# Patient Record
Sex: Male | Born: 1950
Health system: Southern US, Community
[De-identification: ages and names within clinical notes are randomized; demographics above are authoritative.]

## PROBLEM LIST (undated history)

## (undated) ENCOUNTER — Emergency Department (HOSPITAL_COMMUNITY): Discharge status: Absent without leave | Payer: Medicare Other

## (undated) DIAGNOSIS — E11319 Type 2 diabetes mellitus with unspecified diabetic retinopathy without macular edema: Secondary | ICD-10-CM

## (undated) DIAGNOSIS — E8770 Fluid overload, unspecified: Secondary | ICD-10-CM

## (undated) DIAGNOSIS — H547 Unspecified visual loss: Secondary | ICD-10-CM

## (undated) DIAGNOSIS — K219 Gastro-esophageal reflux disease without esophagitis: Secondary | ICD-10-CM

## (undated) DIAGNOSIS — C61 Malignant neoplasm of prostate: Secondary | ICD-10-CM

## (undated) DIAGNOSIS — L0201 Cutaneous abscess of face: Secondary | ICD-10-CM

## (undated) DIAGNOSIS — E08621 Diabetes mellitus due to underlying condition with foot ulcer: Secondary | ICD-10-CM

## (undated) DIAGNOSIS — M869 Osteomyelitis, unspecified: Secondary | ICD-10-CM

## (undated) DIAGNOSIS — L97509 Non-pressure chronic ulcer of other part of unspecified foot with unspecified severity: Secondary | ICD-10-CM

## (undated) DIAGNOSIS — E1169 Type 2 diabetes mellitus with other specified complication: Secondary | ICD-10-CM

## (undated) DIAGNOSIS — N184 Chronic kidney disease, stage 4 (severe): Secondary | ICD-10-CM

## (undated) DIAGNOSIS — E11621 Type 2 diabetes mellitus with foot ulcer: Secondary | ICD-10-CM

## (undated) DIAGNOSIS — L97502 Non-pressure chronic ulcer of other part of unspecified foot with fat layer exposed: Secondary | ICD-10-CM

## (undated) DIAGNOSIS — Z992 Dependence on renal dialysis: Secondary | ICD-10-CM

## (undated) DIAGNOSIS — N186 End stage renal disease: Secondary | ICD-10-CM

## (undated) DIAGNOSIS — I509 Heart failure, unspecified: Secondary | ICD-10-CM

## (undated) DIAGNOSIS — G8929 Other chronic pain: Secondary | ICD-10-CM

## (undated) DIAGNOSIS — E11628 Type 2 diabetes mellitus with other skin complications: Principal | ICD-10-CM

## (undated) DIAGNOSIS — I739 Peripheral vascular disease, unspecified: Principal | ICD-10-CM

## (undated) DIAGNOSIS — E0800 Diabetes mellitus due to underlying condition with hyperosmolarity without nonketotic hyperglycemic-hyperosmolar coma (NKHHC): Principal | ICD-10-CM

## (undated) DIAGNOSIS — L089 Local infection of the skin and subcutaneous tissue, unspecified: Secondary | ICD-10-CM

## (undated) DIAGNOSIS — S8991XA Unspecified injury of right lower leg, initial encounter: Secondary | ICD-10-CM

## (undated) DIAGNOSIS — Z742 Need for assistance at home and no other household member able to render care: Secondary | ICD-10-CM

## (undated) DIAGNOSIS — R5381 Other malaise: Secondary | ICD-10-CM

## (undated) DIAGNOSIS — M79671 Pain in right foot: Secondary | ICD-10-CM

## (undated) DIAGNOSIS — Z794 Long term (current) use of insulin: Secondary | ICD-10-CM

## (undated) HISTORY — PX: INGUINAL HERNIA REPAIR: SUR1180

## (undated) HISTORY — PX: CATARACT EXTRACTION W/ INTRAOCULAR LENS  IMPLANT, BILATERAL: SHX1307

## (undated) HISTORY — PX: EYE SURGERY: SHX253

---
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
## 1969-04-15 HISTORY — PX: SHOULDER SURGERY: SHX246

## 1999-04-16 HISTORY — PX: ROBOT ASSISTED LAPAROSCOPIC RADICAL PROSTATECTOMY: SHX5141

## 2002-01-09 DIAGNOSIS — E1139 Type 2 diabetes mellitus with other diabetic ophthalmic complication: Secondary | ICD-10-CM

## 2006-08-17 DIAGNOSIS — Z8546 Personal history of malignant neoplasm of prostate: Secondary | ICD-10-CM

## 2006-08-17 DIAGNOSIS — N393 Stress incontinence (female) (male): Secondary | ICD-10-CM | POA: Insufficient documentation

## 2006-11-03 ENCOUNTER — Encounter (INDEPENDENT_AMBULATORY_CARE_PROVIDER_SITE_OTHER): Payer: Self-pay | Admitting: Internal Medicine

## 2007-07-04 ENCOUNTER — Telehealth (INDEPENDENT_AMBULATORY_CARE_PROVIDER_SITE_OTHER): Payer: Self-pay | Admitting: Nurse Practitioner

## 2007-07-19 ENCOUNTER — Ambulatory Visit: Payer: Self-pay | Admitting: Internal Medicine

## 2007-07-19 DIAGNOSIS — E782 Mixed hyperlipidemia: Secondary | ICD-10-CM

## 2007-07-19 DIAGNOSIS — E119 Type 2 diabetes mellitus without complications: Secondary | ICD-10-CM

## 2007-07-19 DIAGNOSIS — IMO0002 Reserved for concepts with insufficient information to code with codable children: Secondary | ICD-10-CM | POA: Insufficient documentation

## 2007-07-19 DIAGNOSIS — D869 Sarcoidosis, unspecified: Secondary | ICD-10-CM

## 2007-07-19 DIAGNOSIS — H6120 Impacted cerumen, unspecified ear: Secondary | ICD-10-CM

## 2007-07-26 ENCOUNTER — Emergency Department (HOSPITAL_COMMUNITY): Admission: EM | Admit: 2007-07-26 | Discharge: 2007-07-26 | Payer: Self-pay | Admitting: Emergency Medicine

## 2007-07-26 ENCOUNTER — Encounter (INDEPENDENT_AMBULATORY_CARE_PROVIDER_SITE_OTHER): Payer: Self-pay | Admitting: Internal Medicine

## 2007-07-29 ENCOUNTER — Encounter (INDEPENDENT_AMBULATORY_CARE_PROVIDER_SITE_OTHER): Payer: Self-pay | Admitting: Internal Medicine

## 2007-07-29 LAB — CONVERTED CEMR LAB
Albumin: 4.4 g/dL (ref 3.5–5.2)
Alkaline Phosphatase: 101 units/L (ref 39–117)
BUN: 10 mg/dL (ref 6–23)
Creatinine, Ser: 1.09 mg/dL (ref 0.40–1.50)
Eosinophils Absolute: 0.2 10*3/uL (ref 0.2–0.7)
Eosinophils Relative: 5 % (ref 0–5)
Glucose, Bld: 153 mg/dL — ABNORMAL HIGH (ref 70–99)
HCT: 41.9 % (ref 39.0–52.0)
HDL: 40 mg/dL (ref >39)
Hemoglobin: 13.2 g/dL (ref 13.0–17.0)
LDL Cholesterol: 86 mg/dL (ref 0–99)
Lymphs Abs: 1.3 10*3/uL (ref 0.7–4.0)
MCV: 67.5 fL — ABNORMAL LOW (ref 78.0–100.0)
Monocytes Absolute: 0.4 10*3/uL (ref 0.1–1.0)
Monocytes Relative: 12 % (ref 3–12)
Neutrophils Relative %: 44 % (ref 43–77)
Potassium: 4.9 meq/L (ref 3.5–5.3)
RBC: 6.21 M/uL — ABNORMAL HIGH (ref 4.22–5.81)
Triglycerides: 311 mg/dL — ABNORMAL HIGH (ref <150)
WBC: 3.4 10*3/uL — ABNORMAL LOW (ref 4.0–10.5)

## 2007-07-31 ENCOUNTER — Encounter (INDEPENDENT_AMBULATORY_CARE_PROVIDER_SITE_OTHER): Payer: Self-pay | Admitting: Internal Medicine

## 2007-08-01 ENCOUNTER — Encounter (INDEPENDENT_AMBULATORY_CARE_PROVIDER_SITE_OTHER): Payer: Self-pay | Admitting: Internal Medicine

## 2007-08-02 ENCOUNTER — Encounter (INDEPENDENT_AMBULATORY_CARE_PROVIDER_SITE_OTHER): Payer: Self-pay | Admitting: Internal Medicine

## 2007-09-28 ENCOUNTER — Encounter: Admission: RE | Admit: 2007-09-28 | Discharge: 2007-12-27 | Payer: Self-pay | Admitting: Ophthalmology

## 2007-10-11 ENCOUNTER — Ambulatory Visit: Payer: Self-pay | Admitting: Internal Medicine

## 2007-10-11 DIAGNOSIS — L0201 Cutaneous abscess of face: Secondary | ICD-10-CM

## 2007-10-11 DIAGNOSIS — L03211 Cellulitis of face: Secondary | ICD-10-CM

## 2007-10-11 HISTORY — DX: Cutaneous abscess of face: L02.01

## 2007-10-24 ENCOUNTER — Emergency Department (HOSPITAL_COMMUNITY): Admission: EM | Admit: 2007-10-24 | Discharge: 2007-10-24 | Payer: Self-pay | Admitting: Emergency Medicine

## 2007-11-20 ENCOUNTER — Ambulatory Visit: Payer: Self-pay | Admitting: Internal Medicine

## 2007-12-02 ENCOUNTER — Encounter (INDEPENDENT_AMBULATORY_CARE_PROVIDER_SITE_OTHER): Payer: Self-pay | Admitting: Internal Medicine

## 2007-12-02 LAB — CONVERTED CEMR LAB
HDL: 40 mg/dL (ref >39)
LDL Cholesterol: 79 mg/dL (ref 0–99)
Triglycerides: 110 mg/dL (ref <150)
VLDL: 22 mg/dL (ref 0–40)

## 2007-12-19 ENCOUNTER — Encounter (INDEPENDENT_AMBULATORY_CARE_PROVIDER_SITE_OTHER): Payer: Self-pay | Admitting: Internal Medicine

## 2007-12-29 ENCOUNTER — Encounter (INDEPENDENT_AMBULATORY_CARE_PROVIDER_SITE_OTHER): Payer: Self-pay | Admitting: Internal Medicine

## 2007-12-29 ENCOUNTER — Emergency Department (HOSPITAL_COMMUNITY): Admission: EM | Admit: 2007-12-29 | Discharge: 2007-12-29 | Payer: Self-pay | Admitting: Emergency Medicine

## 2008-01-03 ENCOUNTER — Ambulatory Visit: Payer: Self-pay | Admitting: Internal Medicine

## 2008-01-03 DIAGNOSIS — N529 Male erectile dysfunction, unspecified: Secondary | ICD-10-CM | POA: Insufficient documentation

## 2008-01-03 DIAGNOSIS — K5000 Crohn's disease of small intestine without complications: Secondary | ICD-10-CM | POA: Insufficient documentation

## 2008-01-08 ENCOUNTER — Encounter (INDEPENDENT_AMBULATORY_CARE_PROVIDER_SITE_OTHER): Payer: Self-pay | Admitting: Internal Medicine

## 2008-01-13 LAB — CONVERTED CEMR LAB: Angiotensin 1 Converting Enzyme: 14 units/L (ref 9–67)

## 2008-01-14 ENCOUNTER — Telehealth (INDEPENDENT_AMBULATORY_CARE_PROVIDER_SITE_OTHER): Payer: Self-pay | Admitting: Internal Medicine

## 2008-06-13 ENCOUNTER — Telehealth (INDEPENDENT_AMBULATORY_CARE_PROVIDER_SITE_OTHER): Payer: Self-pay | Admitting: Internal Medicine

## 2008-06-23 ENCOUNTER — Telehealth (INDEPENDENT_AMBULATORY_CARE_PROVIDER_SITE_OTHER): Payer: Self-pay | Admitting: Internal Medicine

## 2008-08-19 ENCOUNTER — Ambulatory Visit: Payer: Self-pay | Admitting: Internal Medicine

## 2008-08-19 DIAGNOSIS — I10 Essential (primary) hypertension: Secondary | ICD-10-CM

## 2008-08-19 LAB — CONVERTED CEMR LAB
Ketones, urine, test strip: NEGATIVE
Protein, U semiquant: 30
Urobilinogen, UA: 0.2

## 2008-08-21 ENCOUNTER — Encounter (INDEPENDENT_AMBULATORY_CARE_PROVIDER_SITE_OTHER): Payer: Self-pay | Admitting: Internal Medicine

## 2008-08-21 LAB — CONVERTED CEMR LAB
Albumin ELP: 61.2 % (ref 55.8–66.1)
Albumin: 4.9 g/dL (ref 3.5–5.2)
Alkaline Phosphatase: 64 units/L (ref 39–117)
Alpha-1-Globulin: 3.1 % (ref 2.9–4.9)
BUN: 21 mg/dL (ref 6–23)
Beta Globulin: 5.5 % (ref 4.7–7.2)
CO2: 23 meq/L (ref 19–32)
Calcium: 10.2 mg/dL (ref 8.4–10.5)
Chloride: 98 meq/L (ref 96–112)
Eosinophils Absolute: 0.2 10*3/uL (ref 0.0–0.7)
Gamma Globulin: 16.4 % (ref 11.1–18.8)
Glucose, Bld: 177 mg/dL — ABNORMAL HIGH (ref 70–99)
HCT: 44.5 % (ref 39.0–52.0)
Hemoglobin: 14.1 g/dL (ref 13.0–17.0)
IgG (Immunoglobin G), Serum: 1610 mg/dL (ref 694–1618)
Lymphs Abs: 1.8 10*3/uL (ref 0.7–4.0)
MCHC: 31.7 g/dL (ref 30.0–36.0)
MCV: 66.4 fL — ABNORMAL LOW (ref 78.0–100.0)
Monocytes Relative: 12 % (ref 3–12)
Potassium: 4.5 meq/L (ref 3.5–5.3)
RDW: 16.1 % — ABNORMAL HIGH (ref 11.5–15.5)
Sodium: 138 meq/L (ref 135–145)
Total Protein: 8.4 g/dL — ABNORMAL HIGH (ref 6.0–8.3)

## 2008-08-22 ENCOUNTER — Encounter (INDEPENDENT_AMBULATORY_CARE_PROVIDER_SITE_OTHER): Payer: Self-pay | Admitting: Internal Medicine

## 2008-08-28 ENCOUNTER — Telehealth (INDEPENDENT_AMBULATORY_CARE_PROVIDER_SITE_OTHER): Payer: Self-pay | Admitting: Internal Medicine

## 2008-09-04 ENCOUNTER — Encounter (INDEPENDENT_AMBULATORY_CARE_PROVIDER_SITE_OTHER): Payer: Self-pay | Admitting: *Deleted

## 2008-09-09 ENCOUNTER — Encounter (INDEPENDENT_AMBULATORY_CARE_PROVIDER_SITE_OTHER): Payer: Self-pay | Admitting: Internal Medicine

## 2009-01-15 ENCOUNTER — Encounter (INDEPENDENT_AMBULATORY_CARE_PROVIDER_SITE_OTHER): Payer: Self-pay | Admitting: Internal Medicine

## 2009-08-27 ENCOUNTER — Encounter (INDEPENDENT_AMBULATORY_CARE_PROVIDER_SITE_OTHER): Payer: Self-pay | Admitting: Internal Medicine

## 2010-01-06 ENCOUNTER — Encounter (INDEPENDENT_AMBULATORY_CARE_PROVIDER_SITE_OTHER): Payer: Self-pay | Admitting: *Deleted

## 2010-07-11 ENCOUNTER — Inpatient Hospital Stay (HOSPITAL_COMMUNITY)
Admission: EM | Admit: 2010-07-11 | Discharge: 2010-07-16 | Payer: Self-pay | Source: Home / Self Care | Admitting: Emergency Medicine

## 2010-07-12 ENCOUNTER — Encounter (INDEPENDENT_AMBULATORY_CARE_PROVIDER_SITE_OTHER): Payer: Self-pay | Admitting: Internal Medicine

## 2010-07-12 ENCOUNTER — Ambulatory Visit: Payer: Self-pay | Admitting: Vascular Surgery

## 2010-09-14 NOTE — Letter (Signed)
Summary: *[facility] Results Follow up  [name], [place] [address]   Phone: [phone]  Fax: [phone]      01/06/2010   [name] [address] [place], [place]  [address]   Dear  Mr. [name],                            ____S.Drinkard,FNP   ____D. Gore,FNP       ____B. McPherson,MD   ____V. Rankins,MD    __xx__E. Mulberry,MD    ____N. Hassell Done, FNP  ____D. Jobe Igo, MD    ____K. Tomma Lightning, MD    ____Other     This letter is to inform you that your recent test(s):  _______Pap Smear    _______Lab Test     _______X-ray    _______ is within acceptable limits  _______ requires a medication change  _______ requires a follow-up lab visit  ___xx____ requires a follow-up visit with your provider   Comments:  Please call to schedule an appointment with Dr. Amil Amen.  Thank you.       _________________________________________________________ If you have any questions, please contact our office                     Sincerely,  Shellia Carwin CMA [facility]

## 2010-09-14 NOTE — Letter (Signed)
Summary: [facility]  [facility]   Imported By: Roland Earl 08/27/2009 10:09:16  _____________________________________________________________________  External Attachment:    Type:   Image     Comment:   External Document

## 2010-10-26 LAB — BASIC METABOLIC PANEL
BUN: 7 mg/dL (ref 6–23)
Calcium: 8.1 mg/dL — ABNORMAL LOW (ref 8.4–10.5)
GFR calc non Af Amer: 44 mL/min — ABNORMAL LOW (ref >60)
Glucose, Bld: 127 mg/dL — ABNORMAL HIGH (ref 70–99)
Sodium: 138 mEq/L (ref 135–145)

## 2010-10-26 LAB — GLUCOSE, CAPILLARY
Glucose-Capillary: 104 mg/dL — ABNORMAL HIGH (ref 70–99)
Glucose-Capillary: 123 mg/dL — ABNORMAL HIGH (ref 70–99)
Glucose-Capillary: 190 mg/dL — ABNORMAL HIGH (ref 70–99)

## 2010-10-26 LAB — VANCOMYCIN, TROUGH: Vancomycin Tr: 18.7 ug/mL (ref 10.0–20.0)

## 2010-10-27 LAB — URINE CULTURE
Colony Count: NO GROWTH
Culture  Setup Time: 201111280011
Culture: NO GROWTH

## 2010-10-27 LAB — CBC
HCT: 35.9 % — ABNORMAL LOW (ref 39.0–52.0)
HCT: 39.2 % (ref 39.0–52.0)
Hemoglobin: 10.6 g/dL — ABNORMAL LOW (ref 13.0–17.0)
Hemoglobin: 12.4 g/dL — ABNORMAL LOW (ref 13.0–17.0)
MCH: 21 pg — ABNORMAL LOW (ref 26.0–34.0)
MCH: 21 pg — ABNORMAL LOW (ref 26.0–34.0)
MCH: 21.3 pg — ABNORMAL LOW (ref 26.0–34.0)
MCHC: 31.5 g/dL (ref 30.0–36.0)
MCHC: 31.5 g/dL (ref 30.0–36.0)
MCHC: 32.2 g/dL (ref 30.0–36.0)
MCV: 66.2 fL — ABNORMAL LOW (ref 78.0–100.0)
MCV: 66.7 fL — ABNORMAL LOW (ref 78.0–100.0)
MCV: 66.9 fL — ABNORMAL LOW (ref 78.0–100.0)
Platelets: 174 10*3/uL (ref 150–400)
Platelets: 176 10*3/uL (ref 150–400)
Platelets: 176 10*3/uL (ref 150–400)
Platelets: 196 10*3/uL (ref 150–400)
RBC: 5.17 MIL/uL (ref 4.22–5.81)
RBC: 5.86 MIL/uL — ABNORMAL HIGH (ref 4.22–5.81)
RDW: 14.5 % (ref 11.5–15.5)
WBC: 9.1 10*3/uL (ref 4.0–10.5)

## 2010-10-27 LAB — DIFFERENTIAL
Basophils Absolute: 0 10*3/uL (ref 0.0–0.1)
Basophils Absolute: 0 10*3/uL (ref 0.0–0.1)
Basophils Absolute: 0 10*3/uL (ref 0.0–0.1)
Basophils Relative: 0 % (ref 0–1)
Basophils Relative: 0 % (ref 0–1)
Eosinophils Absolute: 0.1 10*3/uL (ref 0.0–0.7)
Eosinophils Absolute: 0.2 10*3/uL (ref 0.0–0.7)
Eosinophils Relative: 1 % (ref 0–5)
Lymphocytes Relative: 13 % (ref 12–46)
Lymphocytes Relative: 16 % (ref 12–46)
Lymphs Abs: 0.9 10*3/uL (ref 0.7–4.0)
Lymphs Abs: 1.2 10*3/uL (ref 0.7–4.0)
Monocytes Absolute: 0.7 10*3/uL (ref 0.1–1.0)
Monocytes Absolute: 0.7 10*3/uL (ref 0.1–1.0)
Monocytes Absolute: 0.7 10*3/uL (ref 0.1–1.0)
Monocytes Relative: 11 % (ref 3–12)
Neutro Abs: 5 10*3/uL (ref 1.7–7.7)
Neutro Abs: 5.9 10*3/uL (ref 1.7–7.7)
Neutrophils Relative %: 68 % (ref 43–77)
Neutrophils Relative %: 78 % — ABNORMAL HIGH (ref 43–77)

## 2010-10-27 LAB — BASIC METABOLIC PANEL
BUN: 15 mg/dL (ref 6–23)
CO2: 26 mEq/L (ref 19–32)
CO2: 26 mEq/L (ref 19–32)
Calcium: 8.4 mg/dL (ref 8.4–10.5)
Chloride: 101 mEq/L (ref 96–112)
Chloride: 102 mEq/L (ref 96–112)
Creatinine, Ser: 1.02 mg/dL (ref 0.4–1.5)
Creatinine, Ser: 1.35 mg/dL (ref 0.4–1.5)
GFR calc Af Amer: >60 mL/min (ref >60)
GFR calc Af Amer: >60 mL/min (ref >60)
GFR calc non Af Amer: >60 mL/min (ref >60)
Glucose, Bld: 216 mg/dL — ABNORMAL HIGH (ref 70–99)
Glucose, Bld: 296 mg/dL — ABNORMAL HIGH (ref 70–99)
Potassium: 4 mEq/L (ref 3.5–5.1)
Sodium: 135 mEq/L (ref 135–145)
Sodium: 139 mEq/L (ref 135–145)

## 2010-10-27 LAB — URINALYSIS, ROUTINE W REFLEX MICROSCOPIC
Bilirubin Urine: NEGATIVE
Glucose, UA: >1000 mg/dL — AB
Hgb urine dipstick: NEGATIVE
Ketones, ur: 15 mg/dL — AB
Leukocytes, UA: NEGATIVE
Nitrite: NEGATIVE
Protein, ur: NEGATIVE mg/dL
Specific Gravity, Urine: 1.02 (ref 1.005–1.030)
Urobilinogen, UA: 0.2 mg/dL (ref 0.0–1.0)
pH: 5.5 (ref 5.0–8.0)

## 2010-10-27 LAB — COMPREHENSIVE METABOLIC PANEL
AST: 18 U/L (ref 0–37)
Albumin: 2.7 g/dL — ABNORMAL LOW (ref 3.5–5.2)
BUN: 12 mg/dL (ref 6–23)
Calcium: 8.2 mg/dL — ABNORMAL LOW (ref 8.4–10.5)
Chloride: 107 mEq/L (ref 96–112)
Creatinine, Ser: 1.03 mg/dL (ref 0.4–1.5)
GFR calc Af Amer: >60 mL/min (ref >60)
GFR calc non Af Amer: >60 mL/min (ref >60)
Total Bilirubin: 0.9 mg/dL (ref 0.3–1.2)

## 2010-10-27 LAB — CULTURE, BLOOD (ROUTINE X 2)
Culture  Setup Time: 201111280941
Culture: NO GROWTH
Culture: NO GROWTH

## 2010-10-27 LAB — IRON AND TIBC
Saturation Ratios: 8 % — ABNORMAL LOW (ref 20–55)
UIBC: 155 ug/dL

## 2010-10-27 LAB — GLUCOSE, CAPILLARY
Glucose-Capillary: 170 mg/dL — ABNORMAL HIGH (ref 70–99)
Glucose-Capillary: 173 mg/dL — ABNORMAL HIGH (ref 70–99)
Glucose-Capillary: 194 mg/dL — ABNORMAL HIGH (ref 70–99)
Glucose-Capillary: 232 mg/dL — ABNORMAL HIGH (ref 70–99)
Glucose-Capillary: 238 mg/dL — ABNORMAL HIGH (ref 70–99)
Glucose-Capillary: 293 mg/dL — ABNORMAL HIGH (ref 70–99)
Glucose-Capillary: 301 mg/dL — ABNORMAL HIGH (ref 70–99)
Glucose-Capillary: 99 mg/dL (ref 70–99)

## 2010-10-27 LAB — URINE MICROSCOPIC-ADD ON

## 2010-10-27 LAB — FERRITIN: Ferritin: 158 ng/mL (ref 22–322)

## 2010-10-27 LAB — RETICULOCYTES
RBC.: 4.91 MIL/uL (ref 4.22–5.81)
Retic Ct Pct: 0.6 % (ref 0.4–3.1)

## 2010-10-27 LAB — HEMOGLOBIN A1C: Hgb A1c MFr Bld: 16.4 % — ABNORMAL HIGH (ref <5.7)

## 2010-10-27 LAB — FOLATE: Folate: 15.3 ng/mL

## 2010-10-27 LAB — URIC ACID: Uric Acid, Serum: 5.3 mg/dL (ref 4.0–7.8)

## 2011-05-11 LAB — COMPREHENSIVE METABOLIC PANEL
ALT: 26
AST: 20
Albumin: 3.9
Alkaline Phosphatase: 90
BUN: 12
CO2: 28
Calcium: 10.1
Chloride: 103
Creatinine, Ser: 1.14
GFR calc Af Amer: >60
GFR calc non Af Amer: >60
Glucose, Bld: 244 — ABNORMAL HIGH
Potassium: 5
Sodium: 137
Total Bilirubin: 1.2
Total Protein: 6.9

## 2011-05-11 LAB — DIFFERENTIAL
Basophils Absolute: 0
Basophils Relative: 0
Eosinophils Absolute: 0.1
Eosinophils Relative: 1
Lymphocytes Relative: 16
Lymphs Abs: 1.1
Monocytes Absolute: 0.5
Monocytes Relative: 7
Neutro Abs: 5.1
Neutrophils Relative %: 76

## 2011-05-11 LAB — URINALYSIS, ROUTINE W REFLEX MICROSCOPIC
Bilirubin Urine: NEGATIVE
Leukocytes, UA: NEGATIVE
Nitrite: NEGATIVE
Specific Gravity, Urine: 1.033 — ABNORMAL HIGH
Urobilinogen, UA: 0.2
pH: 5.5

## 2011-05-11 LAB — CBC
HCT: 43
Hemoglobin: 13.3
MCHC: 30.9
MCV: 68.8 — ABNORMAL LOW
Platelets: 138 — ABNORMAL LOW
RBC: 6.25 — ABNORMAL HIGH
RDW: 16 — ABNORMAL HIGH
WBC: 6.7

## 2011-05-11 LAB — LIPASE, BLOOD: Lipase: 15

## 2011-05-11 LAB — URINE MICROSCOPIC-ADD ON

## 2011-05-23 LAB — DIFFERENTIAL
Eosinophils Absolute: 0.1 — ABNORMAL LOW
Lymphocytes Relative: 36
Lymphs Abs: 1.3
Neutro Abs: 1.8
Neutrophils Relative %: 48

## 2011-05-23 LAB — COMPREHENSIVE METABOLIC PANEL
ALT: 27
BUN: 8
CO2: 25
Calcium: 9.5
Creatinine, Ser: 0.98
GFR calc non Af Amer: >60
Glucose, Bld: 167 — ABNORMAL HIGH

## 2011-05-23 LAB — POCT CARDIAC MARKERS
Myoglobin, poc: 95
Operator id: 4661

## 2011-05-23 LAB — CBC
HCT: 40.8
Hemoglobin: 13
MCHC: 31.9
MCV: 67.9 — ABNORMAL LOW
RBC: 6 — ABNORMAL HIGH

## 2011-12-15 DIAGNOSIS — H4310 Vitreous hemorrhage, unspecified eye: Secondary | ICD-10-CM | POA: Insufficient documentation

## 2011-12-15 DIAGNOSIS — H3530 Unspecified macular degeneration: Secondary | ICD-10-CM | POA: Insufficient documentation

## 2011-12-15 DIAGNOSIS — Z961 Presence of intraocular lens: Secondary | ICD-10-CM | POA: Insufficient documentation

## 2012-08-21 ENCOUNTER — Emergency Department (HOSPITAL_COMMUNITY): Payer: Medicare Other

## 2012-08-21 ENCOUNTER — Inpatient Hospital Stay (HOSPITAL_COMMUNITY): Payer: Medicare Other

## 2012-08-21 ENCOUNTER — Inpatient Hospital Stay (HOSPITAL_COMMUNITY)
Admission: EM | Admit: 2012-08-21 | Discharge: 2012-09-04 | DRG: 336 | Disposition: A | Discharge status: Home / Self Care | Payer: Medicare Other | Attending: General Surgery | Admitting: General Surgery

## 2012-08-21 ENCOUNTER — Encounter (HOSPITAL_COMMUNITY): Payer: Self-pay | Admitting: *Deleted

## 2012-08-21 DIAGNOSIS — I440 Atrioventricular block, first degree: Secondary | ICD-10-CM | POA: Diagnosis present

## 2012-08-21 DIAGNOSIS — R739 Hyperglycemia, unspecified: Secondary | ICD-10-CM

## 2012-08-21 DIAGNOSIS — J90 Pleural effusion, not elsewhere classified: Secondary | ICD-10-CM | POA: Diagnosis present

## 2012-08-21 DIAGNOSIS — K56609 Unspecified intestinal obstruction, unspecified as to partial versus complete obstruction: Secondary | ICD-10-CM | POA: Diagnosis present

## 2012-08-21 DIAGNOSIS — E8779 Other fluid overload: Secondary | ICD-10-CM | POA: Diagnosis not present

## 2012-08-21 DIAGNOSIS — E782 Mixed hyperlipidemia: Secondary | ICD-10-CM | POA: Diagnosis present

## 2012-08-21 DIAGNOSIS — Z79899 Other long term (current) drug therapy: Secondary | ICD-10-CM

## 2012-08-21 DIAGNOSIS — Z8546 Personal history of malignant neoplasm of prostate: Secondary | ICD-10-CM

## 2012-08-21 DIAGNOSIS — E11319 Type 2 diabetes mellitus with unspecified diabetic retinopathy without macular edema: Secondary | ICD-10-CM | POA: Diagnosis present

## 2012-08-21 DIAGNOSIS — L03211 Cellulitis of face: Secondary | ICD-10-CM

## 2012-08-21 DIAGNOSIS — R9431 Abnormal electrocardiogram [ECG] [EKG]: Secondary | ICD-10-CM | POA: Diagnosis present

## 2012-08-21 DIAGNOSIS — E1139 Type 2 diabetes mellitus with other diabetic ophthalmic complication: Secondary | ICD-10-CM | POA: Diagnosis present

## 2012-08-21 DIAGNOSIS — E1142 Type 2 diabetes mellitus with diabetic polyneuropathy: Secondary | ICD-10-CM | POA: Diagnosis present

## 2012-08-21 DIAGNOSIS — IMO0002 Reserved for concepts with insufficient information to code with codable children: Secondary | ICD-10-CM | POA: Diagnosis present

## 2012-08-21 DIAGNOSIS — E8770 Fluid overload, unspecified: Secondary | ICD-10-CM | POA: Diagnosis not present

## 2012-08-21 DIAGNOSIS — I10 Essential (primary) hypertension: Secondary | ICD-10-CM | POA: Diagnosis present

## 2012-08-21 DIAGNOSIS — Z794 Long term (current) use of insulin: Secondary | ICD-10-CM

## 2012-08-21 DIAGNOSIS — E876 Hypokalemia: Secondary | ICD-10-CM | POA: Diagnosis present

## 2012-08-21 DIAGNOSIS — K929 Disease of digestive system, unspecified: Secondary | ICD-10-CM | POA: Diagnosis not present

## 2012-08-21 DIAGNOSIS — E1165 Type 2 diabetes mellitus with hyperglycemia: Secondary | ICD-10-CM | POA: Diagnosis present

## 2012-08-21 DIAGNOSIS — E1149 Type 2 diabetes mellitus with other diabetic neurological complication: Secondary | ICD-10-CM | POA: Diagnosis present

## 2012-08-21 DIAGNOSIS — K567 Ileus, unspecified: Secondary | ICD-10-CM | POA: Diagnosis not present

## 2012-08-21 DIAGNOSIS — E119 Type 2 diabetes mellitus without complications: Secondary | ICD-10-CM | POA: Diagnosis present

## 2012-08-21 DIAGNOSIS — Y838 Other surgical procedures as the cause of abnormal reaction of the patient, or of later complication, without mention of misadventure at the time of the procedure: Secondary | ICD-10-CM | POA: Diagnosis not present

## 2012-08-21 DIAGNOSIS — K9189 Other postprocedural complications and disorders of digestive system: Secondary | ICD-10-CM | POA: Diagnosis not present

## 2012-08-21 DIAGNOSIS — K565 Intestinal adhesions [bands], unspecified as to partial versus complete obstruction: Principal | ICD-10-CM | POA: Diagnosis present

## 2012-08-21 DIAGNOSIS — K560 Paralytic ileus: Secondary | ICD-10-CM | POA: Diagnosis not present

## 2012-08-21 DIAGNOSIS — R079 Chest pain, unspecified: Secondary | ICD-10-CM | POA: Diagnosis present

## 2012-08-21 DIAGNOSIS — Z9889 Other specified postprocedural states: Secondary | ICD-10-CM

## 2012-08-21 HISTORY — DX: Malignant neoplasm of prostate: C61

## 2012-08-21 HISTORY — DX: Fluid overload, unspecified: E87.70

## 2012-08-21 LAB — CBC WITH DIFFERENTIAL/PLATELET
Basophils Absolute: 0 10*3/uL (ref 0.0–0.1)
Basophils Relative: 0 % (ref 0–1)
Eosinophils Relative: 0 % (ref 0–5)
Lymphocytes Relative: 9 % — ABNORMAL LOW (ref 12–46)
MCHC: 33.2 g/dL (ref 30.0–36.0)
MCV: 64.1 fL — ABNORMAL LOW (ref 78.0–100.0)
Neutro Abs: 5.8 10*3/uL (ref 1.7–7.7)
Platelets: 155 10*3/uL (ref 150–400)
RDW: 14.8 % (ref 11.5–15.5)
WBC: 6.7 10*3/uL (ref 4.0–10.5)

## 2012-08-21 LAB — BASIC METABOLIC PANEL
CO2: 23 mEq/L (ref 19–32)
Chloride: 94 mEq/L — ABNORMAL LOW (ref 96–112)
Glucose, Bld: 406 mg/dL — ABNORMAL HIGH (ref 70–99)
Potassium: 4.7 mEq/L (ref 3.5–5.1)
Sodium: 130 mEq/L — ABNORMAL LOW (ref 135–145)

## 2012-08-21 LAB — COMPREHENSIVE METABOLIC PANEL
ALT: 20 U/L (ref 0–53)
AST: 40 U/L — ABNORMAL HIGH (ref 0–37)
Albumin: 4 g/dL (ref 3.5–5.2)
CO2: 22 mEq/L (ref 19–32)
Calcium: 10 mg/dL (ref 8.4–10.5)
GFR calc non Af Amer: 78 mL/min — ABNORMAL LOW (ref >90)
Sodium: 127 mEq/L — ABNORMAL LOW (ref 135–145)
Total Protein: 7.6 g/dL (ref 6.0–8.3)

## 2012-08-21 LAB — URINALYSIS, ROUTINE W REFLEX MICROSCOPIC
Glucose, UA: >1000 mg/dL — AB
Hgb urine dipstick: NEGATIVE
Leukocytes, UA: NEGATIVE
Specific Gravity, Urine: 1.039 — ABNORMAL HIGH (ref 1.005–1.030)
pH: 6 (ref 5.0–8.0)

## 2012-08-21 LAB — GLUCOSE, CAPILLARY
Glucose-Capillary: 285 mg/dL — ABNORMAL HIGH (ref 70–99)
Glucose-Capillary: 265 mg/dL — ABNORMAL HIGH (ref 70–99)
Glucose-Capillary: 136 mg/dL — ABNORMAL HIGH (ref 70–99)
Glucose-Capillary: 170 mg/dL — ABNORMAL HIGH (ref 70–99)

## 2012-08-21 LAB — URINE MICROSCOPIC-ADD ON: Urine-Other: NONE SEEN

## 2012-08-21 LAB — LACTIC ACID, PLASMA: Lactic Acid, Venous: 1.7 mmol/L (ref 0.5–2.2)

## 2012-08-21 MED ORDER — HYDROMORPHONE HCL PF 1 MG/ML IJ SOLN: 1.0000 mg | INTRAMUSCULAR | Status: DC | PRN

## 2012-08-21 MED ORDER — ONDANSETRON HCL 4 MG/2ML IJ SOLN: 4.0000 mg | Freq: Four times a day (QID) | INTRAMUSCULAR | Status: DC | PRN

## 2012-08-21 MED ORDER — HYDROMORPHONE HCL PF 1 MG/ML IJ SOLN
0.5000 mg | INTRAMUSCULAR | Status: DC | PRN
  Administered 2012-08-21 (×2): 1 mg via INTRAVENOUS
  Administered 2012-08-22: 1.5 mg via INTRAVENOUS
  Administered 2012-08-22 – 2012-08-23 (×4): 1 mg via INTRAVENOUS
  Administered 2012-08-23: 1.5 mg via INTRAVENOUS
  Filled 2012-08-21 (×3): qty 1
  Filled 2012-08-21 (×2): qty 2
  Filled 2012-08-21 (×3): qty 1

## 2012-08-21 MED ORDER — INSULIN REGULAR HUMAN 100 UNIT/ML IJ SOLN: 5.0000 [IU] | Freq: Once | INTRAMUSCULAR | Status: DC

## 2012-08-21 MED ORDER — INSULIN ASPART 100 UNIT/ML ~~LOC~~ SOLN
0.0000 [IU] | SUBCUTANEOUS | Status: DC
  Administered 2012-08-21: 8 [IU] via SUBCUTANEOUS
  Administered 2012-08-21: 2 [IU] via SUBCUTANEOUS
  Administered 2012-08-21 – 2012-08-22 (×2): 3 [IU] via SUBCUTANEOUS
  Administered 2012-08-23 – 2012-08-24 (×3): 2 [IU] via SUBCUTANEOUS
  Administered 2012-08-24: 3 [IU] via SUBCUTANEOUS
  Administered 2012-08-26 (×3): 2 [IU] via SUBCUTANEOUS
  Administered 2012-08-27 (×3): 3 [IU] via SUBCUTANEOUS
  Administered 2012-08-27 (×2): 2 [IU] via SUBCUTANEOUS
  Administered 2012-08-27 – 2012-08-28 (×4): 3 [IU] via SUBCUTANEOUS
  Administered 2012-08-28: 2 [IU] via SUBCUTANEOUS
  Administered 2012-08-28: 5 [IU] via SUBCUTANEOUS
  Administered 2012-08-28: 3 [IU] via SUBCUTANEOUS
  Administered 2012-08-29 – 2012-08-30 (×7): 2 [IU] via SUBCUTANEOUS
  Administered 2012-08-31: 3 [IU] via SUBCUTANEOUS
  Administered 2012-08-31 (×2): 2 [IU] via SUBCUTANEOUS
  Administered 2012-08-31 – 2012-09-01 (×3): 3 [IU] via SUBCUTANEOUS
  Administered 2012-09-01 (×2): 2 [IU] via SUBCUTANEOUS
  Administered 2012-09-01: 3 [IU] via SUBCUTANEOUS

## 2012-08-21 MED ORDER — INFLUENZA VIRUS VACC SPLIT PF IM SUSP
0.5000 mL | INTRAMUSCULAR | Status: AC
Start: 2012-08-22 — End: 2012-08-22
  Administered 2012-08-22: 0.5 mL via INTRAMUSCULAR
  Filled 2012-08-21 (×2): qty 0.5

## 2012-08-21 MED ORDER — INSULIN GLARGINE 100 UNIT/ML ~~LOC~~ SOLN: 20.0000 [IU] | Freq: Every day | SUBCUTANEOUS | Status: DC

## 2012-08-21 MED ORDER — FENTANYL CITRATE 0.05 MG/ML IJ SOLN
100.0000 ug | Freq: Once | INTRAMUSCULAR | Status: AC
  Administered 2012-08-21: 100 ug via INTRAVENOUS
  Filled 2012-08-21 (×2): qty 2

## 2012-08-21 MED ORDER — IOHEXOL 300 MG/ML  SOLN
100.0000 mL | Freq: Once | INTRAMUSCULAR | Status: AC | PRN
  Administered 2012-08-21: 100 mL via INTRAVENOUS

## 2012-08-21 MED ORDER — ONDANSETRON HCL 4 MG/2ML IJ SOLN
4.0000 mg | Freq: Four times a day (QID) | INTRAMUSCULAR | Status: DC | PRN
  Administered 2012-08-21: 4 mg via INTRAVENOUS
  Filled 2012-08-21: qty 2

## 2012-08-21 MED ORDER — FENTANYL CITRATE 0.05 MG/ML IJ SOLN
100.0000 ug | Freq: Once | INTRAMUSCULAR | Status: AC
  Administered 2012-08-21: 100 ug via INTRAVENOUS
  Filled 2012-08-21: qty 2

## 2012-08-21 MED ORDER — INSULIN GLARGINE 100 UNIT/ML ~~LOC~~ SOLN
20.0000 [IU] | Freq: Every day | SUBCUTANEOUS | Status: DC
  Administered 2012-08-21 – 2012-08-24 (×4): 20 [IU] via SUBCUTANEOUS

## 2012-08-21 MED ORDER — CLONIDINE HCL 0.2 MG/24HR TD PTWK
0.2000 mg | MEDICATED_PATCH | TRANSDERMAL | Status: DC
  Administered 2012-08-21 – 2012-08-28 (×2): 0.2 mg via TRANSDERMAL
  Filled 2012-08-21 (×3): qty 1

## 2012-08-21 MED ORDER — INSULIN ASPART 100 UNIT/ML ~~LOC~~ SOLN
5.0000 [IU] | Freq: Once | SUBCUTANEOUS | Status: AC
  Administered 2012-08-21: 5 [IU] via SUBCUTANEOUS
  Filled 2012-08-21: qty 5

## 2012-08-21 MED ORDER — ONDANSETRON HCL 4 MG/2ML IJ SOLN
4.0000 mg | Freq: Once | INTRAMUSCULAR | Status: AC
  Administered 2012-08-21: 4 mg via INTRAVENOUS
  Filled 2012-08-21: qty 2

## 2012-08-21 MED ORDER — HYDROMORPHONE HCL PF 1 MG/ML IJ SOLN
1.0000 mg | Freq: Once | INTRAMUSCULAR | Status: AC
  Administered 2012-08-21: 1 mg via INTRAVENOUS
  Filled 2012-08-21: qty 1

## 2012-08-21 MED ORDER — SODIUM CHLORIDE 0.9 % IV SOLN: INTRAVENOUS | Status: DC

## 2012-08-21 MED ORDER — DIPHENHYDRAMINE HCL 12.5 MG/5ML PO ELIX: 12.5000 mg | ORAL_SOLUTION | Freq: Four times a day (QID) | ORAL | Status: DC | PRN

## 2012-08-21 MED ORDER — DIPHENHYDRAMINE HCL 50 MG/ML IJ SOLN
12.5000 mg | Freq: Four times a day (QID) | INTRAMUSCULAR | Status: DC | PRN
  Filled 2012-08-21: qty 1

## 2012-08-21 MED ORDER — HEPARIN SODIUM (PORCINE) 5000 UNIT/ML IJ SOLN
5000.0000 [IU] | Freq: Three times a day (TID) | INTRAMUSCULAR | Status: DC
  Administered 2012-08-21 – 2012-09-04 (×41): 5000 [IU] via SUBCUTANEOUS
  Filled 2012-08-21 (×47): qty 1

## 2012-08-21 MED ORDER — LIDOCAINE HCL 2 % EX GEL
CUTANEOUS | Status: AC
  Administered 2012-08-21: 11:00:00
  Filled 2012-08-21: qty 10

## 2012-08-21 MED ORDER — SODIUM CHLORIDE 0.9 % IV SOLN
INTRAVENOUS | Status: DC
  Administered 2012-08-21 – 2012-08-22 (×3): via INTRAVENOUS

## 2012-08-21 MED ORDER — SODIUM CHLORIDE 0.9 % IV SOLN
INTRAVENOUS | Status: DC
  Administered 2012-08-21: 1000 mL via INTRAVENOUS

## 2012-08-21 NOTE — Progress Notes (Signed)
[facility] ED CM noted CM consult related to concerns with medication cost and glucometer.  CM spoke with pt who reports concerns with medication cost was "because my money was funny at the time" but no concerns with any particular medications at this time. Referred pt to [place] health care customer service number to speak to prescription vendor about decreasing cost of medications and or changing to generics Pt reports he has an accucheck ultra glucometer.  CM discuss not having a program a chs to provide a free glucometer. Discussed discounted glucometer programs and ability to contact [place] to have a new meter sent to him if he qualifies Pt noted to doze during interaction but male at his bedside voiced understanding and appreciation of services/resources Encouraged her and pt to contact ED Cm or unit CM for further assistance

## 2012-08-21 NOTE — ED Notes (Signed)
Pt. Made aware the need for urine.

## 2012-08-21 NOTE — ED Notes (Signed)
Patient transported to CT 

## 2012-08-21 NOTE — ED Notes (Signed)
#  16Fr ngt inserted in lt nare 2 attempts tolerated

## 2012-08-21 NOTE — Progress Notes (Signed)
NG tube pulled back approximately 10cm reconnected to intermittent suction as ordered.  Small amount of brownish fluid in tubing

## 2012-08-21 NOTE — ED Provider Notes (Signed)
Pt left with me at the change of shift to get CT results. Pt reports onset of abdominal pain yesterday with distention and nausea and vomiting. He reports he was having prostate surgery at [facility] and the robotic arm broke and he had to have open surgery done.   Pt looks uncomfortable. He is noted to have clustering of hyperactive bowel sounds. His abdomen is distended.    09:19 Will Creig Hines, PA will come see patient and wants him admitted to [facility], asks to have hospitalist consult to manage his diabetes.   10:30 Byrd Hesselbach, hospitalist contacted and will follow patient for his diabetes.   Ct Abdomen Pelvis W Contrast  08/21/2012  *RADIOLOGY REPORT*  Clinical Data: Abdominal distention and pain, nausea and vomiting. History of prostate cancer status post radical prostatectomy and hernia repair.  CT ABDOMEN AND PELVIS WITH CONTRAST  Technique:  Multidetector CT imaging of the abdomen and pelvis was performed following the standard protocol during bolus administration of intravenous contrast.  Contrast: 15mL OMNIPAQUE IOHEXOL 300 MG/ML  SOLN  Comparison: 12/29/2007  Findings: Minimal dependent bibasilar scarring or atelectasis again noted.  5 mm nodule abutting the left hemidiaphragm image 10 is stable.  There is a mid small bowel dilatation to the level of an abrupt transition point subjacent to the umbilicus, image 51 series 2. Distal small bowel and colonic decompression noted.  Trace fluid tracks along the right pericolic gutter.  No extrinsic mass lesion is identified.  No mass within the bowel is identified.  No free air.  Liver, gallbladder, adrenal glands, left kidney, spleen, and pancreas are unremarkable.  1.2 cm right upper renal pole cortical cyst incidentally noted.  The appendix is normal.  Bladder is normal.  Clips along the pelvic sidewalls are compatible with prior prostatectomy.  No pelvic mass is identified.  Trace free pelvic fluid is identified image 60.  No lymphadenopathy.  No lytic or  sclerotic osseous lesion or acute osseous abnormality. Lumbar spine degenerative changes are again noted.  Mild right hip degenerative change.  IMPRESSION: Small bowel obstruction to the level of the distal mid small bowel with abrupt transition point immediately adjacent to the umbilicus. This could be due to adhesion or an occult intrinsic or extrinsic mass lesion.  Free fluid is present without loculation to suggest abscess formation, and no free air is identified.  This does confer a risk of bowel ischemia.  Status post radical prostatectomy without evidence for local recurrence or intra-abdominal/pelvic metastatic disease.   Original Report Authenticated By: Conchita Paris, M.D.     Diagnoses that have been ruled out:  None  Diagnoses that are still under consideration:  None  Final diagnoses:  SBO (small bowel obstruction)  Hyperglycemia    Plan admission   Rolland Porter, MD, Alanson Aly, MD 08/21/12 1504

## 2012-08-21 NOTE — ED Provider Notes (Addendum)
History     CSN: [id number]  Arrival date & time 08/21/12  0224   First MD Initiated Contact with Patient 08/21/12 [phone]      Chief Complaint  Patient presents with  . Abdominal Pain    (Consider location/radiation/quality/duration/timing/severity/associated sxs/prior treatment) HPI Is a 62 year old male with abdominal pain that began yesterday about lunchtime. It is steadily worsened. It is located in his lower abdomen, "about in the center". It is been associated with nausea and vomiting and to a lesser degree diarrhea. His pain is moderate to severe. It is poorly characterized It is worse with palpation or movement. He was given IV Zofran prior to my evaluation with control of his nausea. He is not aware of having a fever. He states his abdomen felt somewhat distended earlier the  Past Medical History  Diagnosis Date  . Diabetes mellitus without complication   . Cancer   . Prostate cancer     Past Surgical History  Procedure Date  . Hernia repair   . Prostate surgery   . Shoulder surgery     No family history on file.  History  Substance Use Topics  . Smoking status: Never Smoker   . Smokeless tobacco: Not on file  . Alcohol Use: No      Review of Systems  All other systems reviewed and are negative.    Allergies  Naproxen  Home Medications   Current Outpatient Rx  Name  Route  Sig  Dispense  Refill  . GABAPENTIN 300 MG PO CAPS   Oral   Take 300 mg by mouth 2 (two) times daily.         [name] GLIPIZIDE 10 MG PO TABS   Oral   Take 10 mg by mouth daily.         . INSULIN GLARGINE 100 UNIT/ML [place] SOLN   Subcutaneous   Inject 20 Units into the skin at bedtime.         [name] METFORMIN HCL 500 MG PO TABS   Oral   Take 1,000 mg by mouth daily.         [name] NABUMETONE 750 MG PO TABS   Oral   Take 750 mg by mouth 2 (two) times daily.         [name] SIMVASTATIN 80 MG PO TABS   Oral   Take 80 mg by mouth daily.           BP 164/88  Pulse 98  Temp  98.9 F (37.2 C) (Oral)  Resp 18  SpO2 93%  Physical Exam General: Well-developed, well-nourished male in no acute distress; appearance consistent with age of record HENT: normocephalic, atraumatic; poor and patient Eyes: pupils equal round and reactive to light; extraocular muscles intact Neck: supple Heart: regular rate and rhythm; no murmurs, rubs or gallops Lungs: clear to auscultation bilaterally Abdomen: soft; nondistended; lower abdominal tenderness most prominent in the suprapubic and right lower quadrant regions; bowel sounds present Extremities: No deformity; full range of motion Neurologic: Awake, alert and oriented; motor function intact in all extremities and symmetric; no facial droop Skin: Warm and dry Psychiatric: Flat affect    ED Course  Procedures (including critical care time)     MDM   Nursing notes and vitals signs, including pulse oximetry, reviewed.  Summary of this visit's results, reviewed by myself:  Labs:  Results for orders placed during the hospital encounter of 08/21/12 (from the past 24 hour(s))  CBC WITH DIFFERENTIAL  Status: Abnormal   Collection Time   08/21/12  4:06 AM      Component Value Range   WBC 6.7  4.0 - 10.5 K/uL   RBC 7.15 (*) 4.22 - 5.81 MIL/uL   Hemoglobin 15.2  13.0 - 17.0 g/dL   HCT 45.8  39.0 - 52.0 %   MCV 64.1 (*) 78.0 - 100.0 fL   MCH 21.3 (*) 26.0 - 34.0 pg   MCHC 33.2  30.0 - 36.0 g/dL   RDW 14.8  11.5 - 15.5 %   Platelets 155  150 - 400 K/uL   Neutrophils Relative 87 (*) 43 - 77 %   Neutro Abs 5.8  1.7 - 7.7 K/uL   Lymphocytes Relative 9 (*) 12 - 46 %   Lymphs Abs 0.6 (*) 0.7 - 4.0 K/uL   Monocytes Relative 4  3 - 12 %   Monocytes Absolute 0.3  0.1 - 1.0 K/uL   Eosinophils Relative 0  0 - 5 %   Eosinophils Absolute 0.0  0.0 - 0.7 K/uL   Basophils Relative 0  0 - 1 %   Basophils Absolute 0.0  0.0 - 0.1 K/uL  COMPREHENSIVE METABOLIC PANEL     Status: Abnormal   Collection Time   08/21/12  4:06 AM       Component Value Range   Sodium 127 (*) 135 - 145 mEq/L   Potassium 5.9 (*) 3.5 - 5.1 mEq/L   Chloride 89 (*) 96 - 112 mEq/L   CO2 22  19 - 32 mEq/L   Glucose, Bld 405 (*) 70 - 99 mg/dL   BUN 13  6 - 23 mg/dL   Creatinine, Ser 1.00  0.50 - 1.35 mg/dL   Calcium 10.0  8.4 - 10.5 mg/dL   Total Protein 7.6  6.0 - 8.3 g/dL   Albumin 4.0  3.5 - 5.2 g/dL   AST 40 (*) 0 - 37 U/L   ALT 20  0 - 53 U/L   Alkaline Phosphatase 140 (*) 39 - 117 U/L   Total Bilirubin 0.7  0.3 - 1.2 mg/dL   GFR calc non Af Amer 78 (*) >90 mL/min   GFR calc Af Amer >90  >90 mL/min  LIPASE, BLOOD     Status: Normal   Collection Time   08/21/12  4:06 AM      Component Value Range   Lipase 14  11 - 59 U/L  BASIC METABOLIC PANEL     Status: Abnormal   Collection Time   08/21/12  6:22 AM      Component Value Range   Sodium 130 (*) 135 - 145 mEq/L   Potassium 4.7  3.5 - 5.1 mEq/L   Chloride 94 (*) 96 - 112 mEq/L   CO2 23  19 - 32 mEq/L   Glucose, Bld 406 (*) 70 - 99 mg/dL   BUN 14  6 - 23 mg/dL   Creatinine, Ser 1.07  0.50 - 1.35 mg/dL   Calcium 9.8  8.4 - 10.5 mg/dL   GFR calc non Af Amer 72 (*) >90 mL/min   GFR calc Af Amer 83 (*) >90 mL/min  URINALYSIS, ROUTINE W REFLEX MICROSCOPIC     Status: Abnormal   Collection Time   08/21/12  6:43 AM      Component Value Range   Color, Urine YELLOW  YELLOW   APPearance CLEAR  CLEAR   Specific Gravity, Urine 1.039 (*) 1.005 - 1.030   pH 6.0  5.0 -  8.0   Glucose, UA >1000 (*) NEGATIVE mg/dL   Hgb urine dipstick NEGATIVE  NEGATIVE   Bilirubin Urine NEGATIVE  NEGATIVE   Ketones, ur 40 (*) NEGATIVE mg/dL   Protein, ur 30 (*) NEGATIVE mg/dL   Urobilinogen, UA 0.2  0.0 - 1.0 mg/dL   Nitrite NEGATIVE  NEGATIVE   Leukocytes, UA NEGATIVE  NEGATIVE  URINE MICROSCOPIC-ADD ON     Status: Normal   Collection Time   08/21/12  6:43 AM      Component Value Range   Urine-Other       Value: NO FORMED ELEMENTS SEEN ON URINE MICROSCOPIC EXAMINATION    Imaging Studies: Ct Abdomen  Pelvis W Contrast  08/21/2012  *RADIOLOGY REPORT*  Clinical Data: Abdominal distention and pain, nausea and vomiting. History of prostate cancer status post radical prostatectomy and hernia repair.  CT ABDOMEN AND PELVIS WITH CONTRAST  Technique:  Multidetector CT imaging of the abdomen and pelvis was performed following the standard protocol during bolus administration of intravenous contrast.  Contrast: 189mL OMNIPAQUE IOHEXOL 300 MG/ML  SOLN  Comparison: 12/29/2007  Findings: Minimal dependent bibasilar scarring or atelectasis again noted.  5 mm nodule abutting the left hemidiaphragm image 10 is stable.  There is a mid small bowel dilatation to the level of an abrupt transition point subjacent to the umbilicus, image 51 series 2. Distal small bowel and colonic decompression noted.  Trace fluid tracks along the right pericolic gutter.  No extrinsic mass lesion is identified.  No mass within the bowel is identified.  No free air.  Liver, gallbladder, adrenal glands, left kidney, spleen, and pancreas are unremarkable.  1.2 cm right upper renal pole cortical cyst incidentally noted.  The appendix is normal.  Bladder is normal.  Clips along the pelvic sidewalls are compatible with prior prostatectomy.  No pelvic mass is identified.  Trace free pelvic fluid is identified image 60.  No lymphadenopathy.  No lytic or sclerotic osseous lesion or acute osseous abnormality. Lumbar spine degenerative changes are again noted.  Mild right hip degenerative change.  IMPRESSION: Small bowel obstruction to the level of the distal mid small bowel with abrupt transition point immediately adjacent to the umbilicus. This could be due to adhesion or an occult intrinsic or extrinsic mass lesion.  Free fluid is present without loculation to suggest abscess formation, and no free air is identified.  This does confer a risk of bowel ischemia.  Status post radical prostatectomy without evidence for local recurrence or intra-abdominal/pelvic  metastatic disease.   Original Report Authenticated By: Conchita Paris, M.D.     9:55 AM Patient continues to drink contrast for CT scan. Dr. Tomi Bamberger will follow up on CT results and make disposition.         Wynetta Fines, MD 08/21/12 0725  Wynetta Fines, MD 08/21/12 [phone]

## 2012-08-21 NOTE — ED Notes (Signed)
Pt c/o abd pain that started at lunchtime yesterday and has gradually gotten worse; vomited en route to hospital;

## 2012-08-21 NOTE — Progress Notes (Signed)
Patient sedated after pain medication, mouth breathing, oxygen level dropped to 90%, oxygen 2L via [place] started

## 2012-08-21 NOTE — Progress Notes (Signed)
Pt confirms he does not have a pcp only seen at urgent care centers

## 2012-08-21 NOTE — Consult Note (Signed)
[facility] Medical Consultation  JIANNI WIST [id number] DOB: 1951/03/19 DOA: 08/21/2012 PCP: No primary provider on file.   Requesting physician: Dr. Rolland Porter Date of consultation: 08/21/2012 Reason for consultation: Diabetes management  Impression/Recommendations Principal Problem:  *Small bowel obstruction  Management per primary team. Continue nasogastric decompression of stomach. Active Problems:  Chest pain/abnormal EKG  Chest pain now resolved. Would recheck EKG to see if T wave inversions in the lateral leads have resolved. Would give aspirin daily at discharge (will not start now in case surgery needed).  DIABETES MELLITUS, TYPE II, UNCONTROLLED  Consult diabetes coordinator and case manager to help with aftercare.  Started on moderate scale sliding scale insulin while n.p.o. Start Lantus 20 units each bedtime. Check hemoglobin A1c.  Consider resuming oral hypoglycemics and Lantus at discharge.  HYPERLIPIDEMIA, MIXED  Patient will need a prescription for his statin therapy and case manager has been consulted to help find him a physician.  HYPERTENSION  Would place on a clonidine patch since currently n.p.o.  I will followup again tomorrow. Please contact me if I can be of assistance in the meanwhile. Thank you for this consultation.  Chief Complaint: Abdominal pain  HPI:  Mr. Hirt is a 62 year old man with past medical history of diabetes, diabetic retinopathy, dyslipidemia, and prior prostatectomy as well as ventral hernia repair who was admitted by Dr. Lilyan Punt of the surgical service for treatment of a small bowel obstruction, confirmed by CT scans.  The patient states that he normally manages his diabetes with 20 units of Lantus daily as well as oral hypoglycemics.  He does not regularly check his blood glucoses.  Has a glucose machine but no strips.  He does not currently have a primary care doctor.  Used to see Dr. Carlis Abbott.  Ran out of all of his  medications 2 months ago except for the Lantus, which he takes sporadically.    Review of Systems:  Constitutional: No fever or chills;  Appetite normal up until yesterday; + weight loss, about 8 lbs in 6 months.  HEENT: + blurry vision and floaters, no diplopia, no pharyngitis or dysphagia CV: + chest pain that started last night but gone now, no arrhythmia.  Resp: No SOB, + cough productive of thick yellow mucous. GI: + N/V, last BM yesterday, no diarrhea, no melena or hematochezia.  GU: No dysuria or hematuria.  MSK: no myalgias/arthralgias.  Neuro:  No headache or focal neurological deficits.  Psych: No depression or anxiety.  Endo: No thyroid disease, +DM.  Skin: No rashes or lesions.  Heme: No anemia or blood dyscrasia   Past Medical History  Diagnosis Date  . Diabetes mellitus without complication   . Cancer   . Prostate cancer    Past Surgical History  Procedure Date  . Hernia repair   . Prostate surgery   . Shoulder surgery    Social History:  reports that he has never smoked. He has never used smokeless tobacco. He reports that he does not drink alcohol. His drug history not on file.  Allergies  Allergen Reactions  . Naproxen Anaphylaxis    REACTION: Throat swells and cannot breathe   Family History  Problem Relation Age of Onset  . Diabetes Mellitus II Mother     Prior to Admission medications   Medication Sig Start Date End Date Taking? Authorizing Provider  gabapentin (NEURONTIN) 300 MG capsule Take 300 mg by mouth 2 (two) times daily.   Yes Historical Provider, MD  glipiZIDE (GLUCOTROL)  10 MG tablet Take 10 mg by mouth daily.   Yes Historical Provider, MD  insulin glargine (LANTUS) 100 UNIT/ML injection Inject 20 Units into the skin at bedtime.   Yes Historical Provider, MD  metFORMIN (GLUCOPHAGE) 500 MG tablet Take 1,000 mg by mouth daily.   Yes Historical Provider, MD  nabumetone (RELAFEN) 750 MG tablet Take 750 mg by mouth 2 (two) times daily.   Yes Historical  Provider, MD  simvastatin (ZOCOR) 80 MG tablet Take 80 mg by mouth daily.   Yes Historical Provider, MD   Physical Exam: Blood pressure 173/75, pulse 84, temperature 98.8 F (37.1 C), temperature source Oral, resp. rate 18, height 5\' 7"  (1.702 m), weight 79.1 kg (174 lb 6.1 oz), SpO2 97.00%. Filed Vitals:   08/21/12 0231 08/21/12 0945 08/21/12 1238 08/21/12 1449  BP: 166/68 164/88 177/76 173/75  Pulse: 82 98 93 84  Temp: 98.6 F (37 C) 98.9 F (37.2 C) 98.2 F (36.8 C) 98.8 F (37.1 C)  TempSrc: Oral Oral Oral Oral  Resp: 18  18 18   Height:   5\' 7"  (1.702 m)   Weight:   79.1 kg (174 lb 6.1 oz)   SpO2: 100% 93% 95% 97%     General:  No acute distress.  Eyes: Pupils equal, round reactive to light and accommodation. Sclerae nonicteric.  ENT: Oropharynx clear. Mucous membranes slightly dry. Nasogastric tube present draining brown liquid. Fair dentition with some missing teeth.  Neck: Supple, no thyromegaly, lymphadenopathy, no jugular venous distention.  Cardiovascular: Regular rate, and rhythm. Grade 2/6 murmur, no rubs, or gallops.  Respiratory: Lungs clear to auscultation bilaterally with good air movement.  Abdomen: Slightly distended. Tender to palpation.  Skin: Warm and dry. No rashes.  Musculoskeletal: Moves all extremities x4 with equal strength.  Psychiatric: Affect and mood normal.  Neurologic: Alert and oriented x3. Cranial nerves II through XII grossly intact. Nonfocal  Labs on Admission:  Basic Metabolic Panel:  Lab [id number] 08/21/12 0406  NA 130* 127*  K 4.7 5.9*  CL 94* 89*  CO2 23 22  GLUCOSE 406* 405*  BUN 14 13  CREATININE 1.07 1.00  CALCIUM 9.8 10.0  MG -- --  PHOS -- --   Liver Function Tests:  Lab 08/21/12 0406  AST 40*  ALT 20  ALKPHOS 140*  BILITOT 0.7  PROT 7.6  ALBUMIN 4.0    Lab 08/21/12 0406  LIPASE 14  AMYLASE --   CBC:  Lab 08/21/12 0406  WBC 6.7  NEUTROABS 5.8  HGB 15.2  HCT 45.8  MCV 64.1*  PLT 155    CBG:  Lab 08/21/12 1141  GLUCAP 285*    Radiological Exams on Admission: Ct Abdomen Pelvis W Contrast  08/21/2012  *RADIOLOGY REPORT*  Clinical Data: Abdominal distention and pain, nausea and vomiting. History of prostate cancer status post radical prostatectomy and hernia repair.  CT ABDOMEN AND PELVIS WITH CONTRAST  Technique:  Multidetector CT imaging of the abdomen and pelvis was performed following the standard protocol during bolus administration of intravenous contrast.  Contrast: 1107mL OMNIPAQUE IOHEXOL 300 MG/ML  SOLN  Comparison: 12/29/2007  Findings: Minimal dependent bibasilar scarring or atelectasis again noted.  5 mm nodule abutting the left hemidiaphragm image 10 is stable.  There is a mid small bowel dilatation to the level of an abrupt transition point subjacent to the umbilicus, image 51 series 2. Distal small bowel and colonic decompression noted.  Trace fluid tracks along the right pericolic gutter.  No extrinsic  mass lesion is identified.  No mass within the bowel is identified.  No free air.  Liver, gallbladder, adrenal glands, left kidney, spleen, and pancreas are unremarkable.  1.2 cm right upper renal pole cortical cyst incidentally noted.  The appendix is normal.  Bladder is normal.  Clips along the pelvic sidewalls are compatible with prior prostatectomy.  No pelvic mass is identified.  Trace free pelvic fluid is identified image 60.  No lymphadenopathy.  No lytic or sclerotic osseous lesion or acute osseous abnormality. Lumbar spine degenerative changes are again noted.  Mild right hip degenerative change.  IMPRESSION: Small bowel obstruction to the level of the distal mid small bowel with abrupt transition point immediately adjacent to the umbilicus. This could be due to adhesion or an occult intrinsic or extrinsic mass lesion.  Free fluid is present without loculation to suggest abscess formation, and no free air is identified.  This does confer a risk of bowel ischemia.   Status post radical prostatectomy without evidence for local recurrence or intra-abdominal/pelvic metastatic disease.   Original Report Authenticated By: Conchita Paris, M.D.    Dg Abd Portable 1v  08/21/2012  *RADIOLOGY REPORT*  Clinical Data: The NG tube placement.  PORTABLE ABDOMEN - 1 VIEW  Comparison: 08/21/2012  Findings: NG tube coils in the stomach.  Continued small bowel distention with gas and contrast material, not significantly changed since prior CT.  Small amount of colonic gas noted.  No free air organomegaly.  IMPRESSION: NG tube coils in the stomach.  Continued high-grade small bowel obstruction pattern.   Original Report Authenticated By: Rolm Baptise, M.D.     EKG: Independently reviewed. Normal sinus rhythm with lateral T wave inversions.  Time spent: 1 hour  RAMA,CHRISTINA [facility] Pager [phone]  If 7PM-7AM, please contact night-coverage [email or web address] Password [facility] 08/21/2012, 4:24 PM

## 2012-08-21 NOTE — H&P (Signed)
[name] is an 63 y.o. male.   Chief Complaint: Abdominal pain Primary care: Urgent care HPI: Patient is a 62 year old gentleman with a history of prior prostatectomy and ventral hernia repair. He has never had any problems until yesterday after lunch he ate some popcorn then developed acute pain in the midabdomen. Pain became severe and was unrelieved by Motrin. He ultimately went home but continued to have pain throughout the rest of the day until the early morning hours when he was ultimately brought to the emergency room and [facility]. He reports having multiple episodes of nausea and vomiting; 3 times at home, 3 times in route to the hospital, and at least once since he's been in the hospital early this morning. He continues to have pain symptoms improved with medication. Then it returns. He had some minimal relief with vomiting but it did not last long. He also reports acid indigestion-like symptoms with vomiting. Workup in the ER at [facility] shows a sodium of 127, a potassium of 5.9, glucose of 405. Repeat study 2 hours later shows glucose 406, Na 130.  WBC, H/H is normal. UA shows glucose, CT scan shows SBO mid small bowel with abrupt transition point adjacent to the umbilicus, witn concern for bowel ischemia.  Film reviewed by Dr. Lilyan Punt and [facility], they see no free air or acute ischemia.  We plan to admit and treat for SBO.  Past Medical History  Diagnosis Date  . Diabetes mellitus without complication 20 plus years.   . Diabetic retinopathy with Laser RX at [facility]   . Prostate cancer with Prostatectomy, [place], Robotic converted to open. Visual impairment Dyslipidemia     Past Surgical History  Procedure Date  . Hernia repair 1980's  He does not think he had mesh.   . Prostate surgery 7 years ago    . Shoulder surgery left 1980's Laser surgery and cataract surgery at [facility]     No family history on file. Social History:   reports that he has never smoked. He does not have any smokeless tobacco history on file. He reports that he does not drink alcohol. His drug history not on file. Tobacco;  4 years, quit in 1968, Etoh: rare  Drugs: none Married and works at [employer]:  Allergies  Allergen Reactions  . Naproxen     REACTION: Throat swells and cannot breathe   Prior to Admission medications   Medication Sig Start Date End Date Taking? Authorizing Provider  gabapentin (NEURONTIN) 300 MG capsule Take 300 mg by mouth 2 (two) times daily.   Yes Historical Provider, MD  glipiZIDE (GLUCOTROL) 10 MG tablet Take 10 mg by mouth daily.   Yes Historical Provider, MD  insulin glargine (LANTUS) 100 UNIT/ML injection Inject 20 Units into the skin at bedtime.   Yes Historical Provider, MD  metFORMIN (GLUCOPHAGE) 500 MG tablet Take 1,000 mg by mouth daily.   Yes Historical Provider, MD  nabumetone (RELAFEN) 750 MG tablet Take 750 mg by mouth 2 (two) times daily.   Yes Historical Provider, MD  simvastatin (ZOCOR) 80 MG tablet Take 80 mg by mouth daily.   Yes Historical Provider, MD     (Not in a hospital admission)  Results for orders placed during the hospital encounter of 08/21/12 (from the past 48 hour(s))  CBC WITH DIFFERENTIAL     Status: Abnormal   Collection Time   08/21/12  4:06 AM  Component Value Range Comment   WBC 6.7  4.0 - 10.5 K/uL    RBC 7.15 (*) 4.22 - 5.81 MIL/uL    Hemoglobin 15.2  13.0 - 17.0 g/dL    HCT 45.8  39.0 - 52.0 %    MCV 64.1 (*) 78.0 - 100.0 fL    MCH 21.3 (*) 26.0 - 34.0 pg    MCHC 33.2  30.0 - 36.0 g/dL    RDW 14.8  11.5 - 15.5 %    Platelets 155  150 - 400 K/uL    Neutrophils Relative 87 (*) 43 - 77 %    Neutro Abs 5.8  1.7 - 7.7 K/uL    Lymphocytes Relative 9 (*) 12 - 46 %    Lymphs Abs 0.6 (*) 0.7 - 4.0 K/uL    Monocytes Relative 4  3 - 12 %    Monocytes Absolute 0.3  0.1 - 1.0 K/uL    Eosinophils Relative 0  0 - 5 %    Eosinophils Absolute 0.0   0.0 - 0.7 K/uL    Basophils Relative 0  0 - 1 %    Basophils Absolute 0.0  0.0 - 0.1 K/uL   COMPREHENSIVE METABOLIC PANEL     Status: Abnormal   Collection Time   08/21/12  4:06 AM      Component Value Range Comment   Sodium 127 (*) 135 - 145 mEq/L    Potassium 5.9 (*) 3.5 - 5.1 mEq/L    Chloride 89 (*) 96 - 112 mEq/L    CO2 22  19 - 32 mEq/L    Glucose, Bld 405 (*) 70 - 99 mg/dL    BUN 13  6 - 23 mg/dL    Creatinine, Ser 1.00  0.50 - 1.35 mg/dL    Calcium 10.0  8.4 - 10.5 mg/dL    Total Protein 7.6  6.0 - 8.3 g/dL    Albumin 4.0  3.5 - 5.2 g/dL    AST 40 (*) 0 - 37 U/L    ALT 20  0 - 53 U/L HEMOLYSIS AT THIS LEVEL MAY AFFECT RESULT   Alkaline Phosphatase 140 (*) 39 - 117 U/L HEMOLYSIS AT THIS LEVEL MAY AFFECT RESULT   Total Bilirubin 0.7  0.3 - 1.2 mg/dL    GFR calc non Af Amer 78 (*) >90 mL/min    GFR calc Af Amer >90  >90 mL/min   LIPASE, BLOOD     Status: Normal   Collection Time   08/21/12  4:06 AM      Component Value Range Comment   Lipase 14  11 - 59 U/L   BASIC METABOLIC PANEL     Status: Abnormal   Collection Time   08/21/12  6:22 AM      Component Value Range Comment   Sodium 130 (*) 135 - 145 mEq/L    Potassium 4.7  3.5 - 5.1 mEq/L    Chloride 94 (*) 96 - 112 mEq/L    CO2 23  19 - 32 mEq/L    Glucose, Bld 406 (*) 70 - 99 mg/dL    BUN 14  6 - 23 mg/dL    Creatinine, Ser 1.07  0.50 - 1.35 mg/dL    Calcium 9.8  8.4 - 10.5 mg/dL    GFR calc non Af Amer 72 (*) >90 mL/min    GFR calc Af Amer 83 (*) >90 mL/min   URINALYSIS, ROUTINE W REFLEX MICROSCOPIC     Status: Abnormal  Collection Time   08/21/12  6:43 AM      Component Value Range Comment   Color, Urine YELLOW  YELLOW    APPearance CLEAR  CLEAR    Specific Gravity, Urine 1.039 (*) 1.005 - 1.030    pH 6.0  5.0 - 8.0    Glucose, UA >1000 (*) NEGATIVE mg/dL    Hgb urine dipstick NEGATIVE  NEGATIVE    Bilirubin Urine NEGATIVE  NEGATIVE    Ketones, ur 40 (*) NEGATIVE mg/dL    Protein, ur 30 (*) NEGATIVE mg/dL     Urobilinogen, UA 0.2  0.0 - 1.0 mg/dL    Nitrite NEGATIVE  NEGATIVE    Leukocytes, UA NEGATIVE  NEGATIVE   URINE MICROSCOPIC-ADD ON     Status: Normal   Collection Time   08/21/12  6:43 AM      Component Value Range Comment   Urine-Other        Value: NO FORMED ELEMENTS SEEN ON URINE MICROSCOPIC EXAMINATION   Ct Abdomen Pelvis W Contrast  08/21/2012  *RADIOLOGY REPORT*  Clinical Data: Abdominal distention and pain, nausea and vomiting. History of prostate cancer status post radical prostatectomy and hernia repair.  CT ABDOMEN AND PELVIS WITH CONTRAST  Technique:  Multidetector CT imaging of the abdomen and pelvis was performed following the standard protocol during bolus administration of intravenous contrast.  Contrast: 17mL OMNIPAQUE IOHEXOL 300 MG/ML  SOLN  Comparison: 12/29/2007  Findings: Minimal dependent bibasilar scarring or atelectasis again noted.  5 mm nodule abutting the left hemidiaphragm image 10 is stable.  There is a mid small bowel dilatation to the level of an abrupt transition point subjacent to the umbilicus, image 51 series 2. Distal small bowel and colonic decompression noted.  Trace fluid tracks along the right pericolic gutter.  No extrinsic mass lesion is identified.  No mass within the bowel is identified.  No free air.  Liver, gallbladder, adrenal glands, left kidney, spleen, and pancreas are unremarkable.  1.2 cm right upper renal pole cortical cyst incidentally noted.  The appendix is normal.  Bladder is normal.  Clips along the pelvic sidewalls are compatible with prior prostatectomy.  No pelvic mass is identified.  Trace free pelvic fluid is identified image 60.  No lymphadenopathy.  No lytic or sclerotic osseous lesion or acute osseous abnormality. Lumbar spine degenerative changes are again noted.  Mild right hip degenerative change.  IMPRESSION: Small bowel obstruction to the level of the distal mid small bowel with abrupt transition point immediately adjacent to the  umbilicus. This could be due to adhesion or an occult intrinsic or extrinsic mass lesion.  Free fluid is present without loculation to suggest abscess formation, and no free air is identified.  This does confer a risk of bowel ischemia.  Status post radical prostatectomy without evidence for local recurrence or intra-abdominal/pelvic metastatic disease.   Original Report Authenticated By: Conchita Paris, M.D.     Review of Systems  Constitutional: Negative for fever, chills, weight loss, malaise/fatigue and diaphoresis.  HENT: Negative.   Eyes:       Vision impaired works for [employer], prior retinopathy and cataract surgery at [facility]. Bilateral  Respiratory: Negative for cough, hemoptysis, sputum production, shortness of breath and wheezing.   Cardiovascular: Negative for chest pain, palpitations, orthopnea, claudication, leg swelling and PND.  Gastrointestinal: Positive for heartburn (occasional, present with vomiting ), nausea, vomiting, abdominal pain and diarrhea (he took an enema last PM and had diarrhea after that.).  Negative for constipation, blood in stool and melena.  Genitourinary: Negative.   Musculoskeletal: Negative.   Skin: Negative.   Neurological: Negative.  Negative for weakness.  Endo/Heme/Allergies: Negative.   Psychiatric/Behavioral: Negative.     Blood pressure 164/88, pulse 98, temperature 98.9 F (37.2 C), temperature source Oral, resp. rate 18, SpO2 93.00%. Physical Exam  Constitutional: He is oriented to person, place, and time. He appears well-developed and well-nourished. No distress.  HENT:  Head: Normocephalic and atraumatic.  Nose: Nose normal.       Some teeth missing  Eyes: Conjunctivae normal and EOM are normal. Pupils are equal, round, and reactive to light. Right eye exhibits no discharge. Left eye exhibits no discharge. No scleral icterus.  Neck: Normal range of motion. Neck supple. No JVD present. No tracheal deviation present. No  thyromegaly present.  Cardiovascular: Normal rate, regular rhythm and intact distal pulses.  Exam reveals no gallop.   Murmur heard. Respiratory: Effort normal and breath sounds normal. No respiratory distress. He has no wheezes. He has no rales. He exhibits no tenderness.  GI: He exhibits distension. He exhibits no mass. There is tenderness (Tender all over but major complaint is mid abdomen above the umbilicus). There is no rebound and no guarding.  Musculoskeletal: He exhibits no edema.  Lymphadenopathy:    He has no cervical adenopathy.  Neurological: He is alert and oriented to person, place, and time. No cranial nerve deficit.  Skin: Skin is warm and dry. No rash noted. He is not diaphoretic. No erythema. No pallor.  Psychiatric: He has a normal mood and affect. His behavior is normal. Judgment and thought content normal.     Assessment/Plan 1. Small bowel obstruction with history of prior ventral hernia repair and prostatectomy. 2. Adult diabetes type 2 3.Diabetic Retinopathy/neuropathy 4.dyslipidemia 5.Visual impairment. 6. Hx of prostate CA  Plan: Patient will be admitted and NG tube placement now. Plan bowel rest with NG decompression. Hydration, observation and further treatment as needed. We will ask medicine to see and help with diabetes management. [facility] physician assistant for Dr. [name].  [name],WILLARD 08/21/2012, 10:15 AM  I have seen and evaluated the patient in the ER.  He does have significant tenderness at the umbilicus but no peritoneal signs.  I have reviewed the CT with Dr. Ardeen Garland in radiology and he says that there is no evidence of mesenteric edema, bowel thickening, air, or evidence of ischemia.  He does not have peritonitis, fever, tachycardia, leukocytosis or any sign of ischemia.  He does have a fairly high grade obstruction and a good chance of needing surgery for treatment.  We discussed the option for NG tube and bowel rest vs. Surgery and the  pros/cons of these.  We will try to place NG and see if any improvement.  If no improvement then we will plan on early intervention.

## 2012-08-21 NOTE — ED Notes (Signed)
MD at bedside. 

## 2012-08-22 ENCOUNTER — Inpatient Hospital Stay (HOSPITAL_COMMUNITY): Payer: Medicare Other

## 2012-08-22 DIAGNOSIS — R079 Chest pain, unspecified: Secondary | ICD-10-CM

## 2012-08-22 DIAGNOSIS — L0201 Cutaneous abscess of face: Secondary | ICD-10-CM

## 2012-08-22 DIAGNOSIS — R9431 Abnormal electrocardiogram [ECG] [EKG]: Secondary | ICD-10-CM

## 2012-08-22 LAB — GLUCOSE, CAPILLARY
Glucose-Capillary: 103 mg/dL — ABNORMAL HIGH (ref 70–99)
Glucose-Capillary: 161 mg/dL — ABNORMAL HIGH (ref 70–99)
Glucose-Capillary: 87 mg/dL (ref 70–99)

## 2012-08-22 LAB — BASIC METABOLIC PANEL
CO2: 27 mEq/L (ref 19–32)
Calcium: 8.7 mg/dL (ref 8.4–10.5)
Chloride: 103 mEq/L (ref 96–112)
Creatinine, Ser: 1.2 mg/dL (ref 0.50–1.35)
Glucose, Bld: 126 mg/dL — ABNORMAL HIGH (ref 70–99)

## 2012-08-22 LAB — HEMOGLOBIN A1C: Mean Plasma Glucose: 421 mg/dL — ABNORMAL HIGH (ref <117)

## 2012-08-22 LAB — CBC
HCT: 40.2 % (ref 39.0–52.0)
Hemoglobin: 12.7 g/dL — ABNORMAL LOW (ref 13.0–17.0)
MCHC: 31.6 g/dL (ref 30.0–36.0)
MCV: 66.3 fL — ABNORMAL LOW (ref 78.0–100.0)

## 2012-08-22 LAB — MAGNESIUM: Magnesium: 1.9 mg/dL (ref 1.5–2.5)

## 2012-08-22 MED ORDER — PHENOL 1.4 % MT LIQD
1.0000 | OROMUCOSAL | Status: DC | PRN
  Filled 2012-08-22: qty 177

## 2012-08-22 MED ORDER — SODIUM CHLORIDE 0.9 % IV BOLUS (SEPSIS)
500.0000 mL | Freq: Once | INTRAVENOUS | Status: AC
  Administered 2012-08-22: 500 mL via INTRAVENOUS

## 2012-08-22 MED ORDER — LIVING WELL WITH DIABETES BOOK
Freq: Once | Status: AC
  Administered 2012-08-22: 12:00:00
  Filled 2012-08-22: qty 1

## 2012-08-22 NOTE — Progress Notes (Signed)
Clinical Social Work Department BRIEF PSYCHOSOCIAL ASSESSMENT 08/22/2012  Patient:  [name], [name]     Account Number:  [id number]     Admit date:  08/21/2012  Clinical Social Worker:  Levie Heritage  Date/Time:  08/22/2012 02:46 PM  Referred by:  Physician  Date Referred:  08/22/2012 Referred for  Other - See comment   Other Referral:   "Financial concerns"   Interview type:  Patient Other interview type:    PSYCHOSOCIAL DATA Living Status:  WIFE Admitted from facility:   Level of care:   Primary support name:  Micheline Maze Primary support relationship to patient:  SPOUSE Degree of support available:   adequate    CURRENT CONCERNS Current Concerns  Financial Resources   Other Concerns:    SOCIAL WORK ASSESSMENT / PLAN Met with Pt to discuss current admission.    Pt reported that he and his wife are having financial difficulty with the IRS and "other people" and that he is currently paying back an [employer] that he took out from [place].  He stated that his wife has a good job as an Higher education careers adviser at [employer], however their two incomes are not sufficient to meet their financial needs.    Pt reported that he has been employed by [employer] for over 10 years and that he has 4 weeks of vacation yearly.  He's thankful for his vacation time, as he's unsure if his current medical px will require surgery; he'll know later today.  Pt called his HR manager with CSW present to inquire about his STD, LTD, vacation/sick time and FMLA.  HR representative will meet with Pt's wife to discuss these further.    CSW discussed with Pt community resources, including DSS's Emergency Assistance Program.  Pt stated that he intends to contact the [employer], as they work closely with [place].  CSW encouraged Pt to use his contacts at work to learn about other community resources that [place] uses for their clients.    CSW thanked  Pt for his time.    No further CSW needs identified.    CSW to sign off.    Pt   Assessment/plan status:  No Further Intervention Required Other assessment/ plan:   Information/referral to community resources:   Discussed with Pt DSS's Emergency Assistance Program.  Pt aware of this program.    PATIENT'S/FAMILY'S RESPONSE TO PLAN OF CARE: Pt thanked CSW for time and assistance.   CSW to sign off.  Bernita Raisin, [place] Work [phone]

## 2012-08-22 NOTE — Progress Notes (Signed)
Inpatient Diabetes Program Recommendations  AACE/ADA: New Consensus Statement on Inpatient Glycemic Control (2013)  Target Ranges:  Prepandial:   less than 140 mg/dL      Peak postprandial:   less than 180 mg/dL (1-2 hours)      Critically ill patients:  140 - 180 mg/dL   Reason for Visit: Diabetes Consult  Patient is a 62 year old gentleman with a history of prior prostatectomy and ventral hernia repair. He has never had any problems until yesterday after lunch he ate some popcorn then developed acute pain in the midabdomen. Pain became severe and was unrelieved by Motrin. He ultimately went home but continued to have pain throughout the rest of the day until the early morning hours when he was ultimately brought to the emergency room and [facility]. He reports having multiple episodes of nausea and vomiting; 3 times at home, 3 times in route to the hospital, and at least once since he's been in the hospital early this morning. He continues to have pain symptoms improved with medication. Then it returns. He had some minimal relief with vomiting but it did not last long. He also reports acid indigestion-like symptoms with vomiting. Workup in the ER at [facility] shows a sodium of 127, a potassium of 5.9, glucose of 405. Repeat study 2 hours later shows glucose 406, Na 130. WBC, H/H is normal. UA shows glucose, CT scan shows SBO mid small bowel with abrupt transition point adjacent to the umbilicus, witn concern for bowel ischemia.   Pt states he has insulin pen at home but wasn't shown how to use it.  Wife gives his Lantus with syringe/needle.  Has meter but does not use it.  States he would like a PCP and/or endo to manage DM.    Results for [name], [name] (MRN [id number]) as of 08/22/2012 11:59  Ref. Range 08/22/2012 05:45  Sodium Latest Range: 135-145 mEq/L 137  Potassium Latest Range: 3.5-5.1 mEq/L 3.9  Chloride Latest Range: 96-112 mEq/L 103  CO2 Latest Range: 19-32 mEq/L 27   BUN Latest Range: 6-23 mg/dL 15  Creatinine Latest Range: 0.50-1.35 mg/dL 1.20  Calcium Latest Range: 8.4-10.5 mg/dL 8.7  GFR calc non Af Amer Latest Range: >90 mL/min 63 (L)  GFR calc Af Amer Latest Range: >90 mL/min 73 (L)  Glucose Latest Range: 70-99 mg/dL 126 (H)  Magnesium Latest Range: 1.5-2.5 mg/dL 1.9  Results for [name], [name] (MRN [id number]) as of 08/22/2012 11:59  Ref. Range 08/21/2012 11:41 08/21/2012 12:32 08/21/2012 17:39 08/21/2012 21:41 08/22/2012 00:03 08/22/2012 04:05 08/22/2012 07:41 08/22/2012 11:43  Glucose-Capillary Latest Range: 70-99 mg/dL 285 (H) 265 (H) 136 (H) 170 (H) 161 (H) 184 (H) 103 (H) 107 (H)   Briefly demonstrated insulin pen use, but pt states he's very sleepy.  Blood sugars much improved.  Will order Living Well With Diabetes book and will give pt info on affordable glucose meter.  Recommend HgbA1C to assess glycemic control prior to hospitalization.  Will f/u tomorrow morning with insulin pen teaching and info on glucose meters.  Pt seems motivated to control his diabetes.  Requesting new PCP.  Thank you. Lorenda Peck, RD, LDN, CDE Inpatient Diabetes Coordinator [phone]

## 2012-08-22 NOTE — Progress Notes (Signed)
Patient ID: [name], male   DOB: April 07, 1951, 62 y.o.   MRN: [id number]  [facility] PROGRESS NOTE  [name] [id number] DOB: Sep 12, 1950 DOA: 08/21/2012 PCP: No primary provider on file.  Brief narrative: [name] is a 62 year old man with past medical history of diabetes, diabetic retinopathy, dyslipidemia, and prior prostatectomy as well as ventral hernia repair who was admitted by Dr. Lilyan Punt of the surgical service for treatment of a small bowel obstruction, confirmed by CT scans. The patient states that he normally manages his diabetes with 20 units of Lantus daily as well as oral hypoglycemics. He does not regularly check his blood glucoses. Has a glucose machine but no strips. He does not currently have a primary care doctor. Used to see Dr. Carlis Abbott. Ran out of all of his medications 2 months ago except for the Lantus, which he takes sporadically.   Principal Problem:  *Small bowel obstruction  Management per primary team. Continue nasogastric decompression of stomach. Active Problems:  Chest pain/abnormal EKG  Chest pain now resolved. Will consider Aspirin upon discharge. Pt denies chest pain this AM. DIABETES MELLITUS, TYPE II, UNCONTROLLED  Consult diabetes coordinator and case manager to help with aftercare.  A1C > 16 Started on moderate scale sliding scale insulin while n.p.o. Start Lantus 20 units each bedtime. Responding well to current therapy.  HYPERLIPIDEMIA, MIXED  Patient will need a prescription for his statin therapy and case manager has been consulted to help find him a physician. HYPERTENSION  Continue clonidine patch since currently n.p.o.  Procedures/Studies: Ct Abdomen Pelvis W Contrast 08/21/2012   Small bowel obstruction to the level of the distal mid small bowel with abrupt transition point immediately adjacent to the umbilicus. This could be due to adhesion or an occult intrinsic or extrinsic mass lesion.  Free fluid is present without  loculation to suggest abscess formation, and no free air is identified.  This does confer a risk of bowel ischemia.  Status post radical prostatectomy without evidence for local recurrence or intra-abdominal/pelvic metastatic disease.      Dg Abd 2 Views 08/22/2012    Similar partial small bowel obstruction, without complicating free intraperitoneal air.     Dg Abd Portable 1v 08/21/2012  NG tube coils in the stomach.  Continued high-grade small bowel obstruction pattern.     Antibiotics:  None  Code Status: Full Family Communication: Pt at bedside  HPI/Subjective: No events overnight.   Objective: Filed Vitals:   08/22/12 0200 08/22/12 0611 08/22/12 1012 08/22/12 1404  BP: 158/79 142/78 143/74 146/73  Pulse: 78 79 76 75  Temp: 98.9 F (37.2 C) 99 F (37.2 C) 98.4 F (36.9 C) 99.7 F (37.6 C)  TempSrc: Oral Oral Oral Oral  Resp: 18 18 18 18   Height:      Weight:      SpO2: 100% 99% 99% 100%    Intake/Output Summary (Last 24 hours) at 08/22/12 1837 Last data filed at 08/22/12 1600  Gross per 24 hour  Intake   2300 ml  Output   1000 ml  Net   1300 ml    Exam:   General:  Pt is alert, follows commands appropriately, not in acute distress  Cardiovascular: Regular rate and rhythm, S1/S2, no murmurs, no rubs, no gallops  Respiratory: Clear to auscultation bilaterally, no wheezing, no crackles, no rhonchi  Abdomen: Soft, non tender, distended, bowel sounds present, no guarding, NGT in place  Extremities: No edema, pulses DP and PT palpable bilaterally  Neuro: Grossly nonfocal  Data Reviewed: Basic Metabolic Panel:  Lab [id number] 0545 08/21/12 0622 08/21/12 0406  NA 137 130* 127*  K 3.9 4.7 5.9*  CL 103 94* 89*  CO2 27 23 22   GLUCOSE 126* 406* 405*  BUN 15 14 13   CREATININE 1.20 1.07 1.00  CALCIUM 8.7 9.8 10.0  MG 1.9 -- --  PHOS -- -- --   Liver Function Tests:  Lab 08/21/12 0406  AST 40*  ALT 20  ALKPHOS 140*  BILITOT 0.7  PROT 7.6  ALBUMIN 4.0      Lab 08/21/12 0406  LIPASE 14  AMYLASE --   CBC:  Lab 08/22/12 0545 08/21/12 0406  WBC 5.7 6.7  NEUTROABS -- 5.8  HGB 12.7* 15.2  HCT 40.2 45.8  MCV 66.3* 64.1*  PLT 165 155   CBG:  Lab 08/22/12 1633 08/22/12 1143 08/22/12 0741 08/22/12 0405 08/22/12 0003  GLUCAP 101* 107* 103* 184* 161*   Scheduled Meds:   . cloNIDine  0.2 mg Transdermal Weekly  . heparin  5,000 Units Subcutaneous Q8H  . insulin aspart  0-15 Units Subcutaneous Q4H  . insulin glargine  20 Units Subcutaneous QHS   Continuous Infusions:   . sodium chloride 150 mL/hr at 08/21/12 2022     Faye Ramsay, MD  [facility] Pager [phone]  If 7PM-7AM, please contact night-coverage [email or web address] Password [facility] 08/22/2012, 6:37 PM   LOS: 1 day

## 2012-08-22 NOTE — Progress Notes (Addendum)
Subjective: He says he feels better this AM, pain much better, urine output low 300 ml per tech. 300 ml in the NG cannister. Film is pending.  Objective: Vital signs in last 24 hours: Temp:  [98.2 F (36.8 C)-99 F (37.2 C)] 99 F (37.2 C) (01/08 [id number]) Pulse Rate:  [76-98] 79  (01/08 0611) Resp:  [18] 18  (01/08 0611) BP: (142-177)/(67-88) 142/78 mmHg (01/08 0611) SpO2:  [93 %-100 %] 99 % (01/08 0611) Weight:  [174 lb 6.1 oz (79.1 kg)] 174 lb 6.1 oz (79.1 kg) (01/07 1238) Last BM Date: 08/20/12  I/O= ?  Afebrile, BP up some, still complaining of pain thru the night 3-6/10 scale H/H is down some, creatinine is normal but trending up, film is pending Intake/Output from previous day: 01/07 0701 - 01/08 0700 In: 0  Out: 300 [Urine:300] Intake/Output this shift:    General appearance: alert, cooperative and no distress GI: soft, much less tender than in ER yesterday, some high pitched BS, no distension, no peritonitis.  Lab Results:   Basename 08/22/12 0545 08/21/12 0406  WBC 5.7 6.7  HGB 12.7* 15.2  HCT 40.2 45.8  PLT 165 155    BMET  Basename 08/22/12 0545 08/21/12 0622  NA 137 130*  K 3.9 4.7  CL 103 94*  CO2 27 23  GLUCOSE 126* 406*  BUN 15 14  CREATININE 1.20 1.07  CALCIUM 8.7 9.8   PT/INR No results found for this basename: LABPROT:2,INR:2 in the last 72 hours   Lab 08/21/12 0406  AST 40*  ALT 20  ALKPHOS 140*  BILITOT 0.7  PROT 7.6  ALBUMIN 4.0     Lipase     Component Value Date/Time   LIPASE 14 08/21/2012 0406     Studies/Results: Ct Abdomen Pelvis W Contrast  08/21/2012  *RADIOLOGY REPORT*  Clinical Data: Abdominal distention and pain, nausea and vomiting. History of prostate cancer status post radical prostatectomy and hernia repair.  CT ABDOMEN AND PELVIS WITH CONTRAST  Technique:  Multidetector CT imaging of the abdomen and pelvis was performed following the standard protocol during bolus administration of intravenous contrast.   Contrast: 143mL OMNIPAQUE IOHEXOL 300 MG/ML  SOLN  Comparison: 12/29/2007  Findings: Minimal dependent bibasilar scarring or atelectasis again noted.  5 mm nodule abutting the left hemidiaphragm image 10 is stable.  There is a mid small bowel dilatation to the level of an abrupt transition point subjacent to the umbilicus, image 51 series 2. Distal small bowel and colonic decompression noted.  Trace fluid tracks along the right pericolic gutter.  No extrinsic mass lesion is identified.  No mass within the bowel is identified.  No free air.  Liver, gallbladder, adrenal glands, left kidney, spleen, and pancreas are unremarkable.  1.2 cm right upper renal pole cortical cyst incidentally noted.  The appendix is normal.  Bladder is normal.  Clips along the pelvic sidewalls are compatible with prior prostatectomy.  No pelvic mass is identified.  Trace free pelvic fluid is identified image 60.  No lymphadenopathy.  No lytic or sclerotic osseous lesion or acute osseous abnormality. Lumbar spine degenerative changes are again noted.  Mild right hip degenerative change.  IMPRESSION: Small bowel obstruction to the level of the distal mid small bowel with abrupt transition point immediately adjacent to the umbilicus. This could be due to adhesion or an occult intrinsic or extrinsic mass lesion.  Free fluid is present without loculation to suggest abscess formation, and no free air is identified.  This  does confer a risk of bowel ischemia.  Status post radical prostatectomy without evidence for local recurrence or intra-abdominal/pelvic metastatic disease.   Original Report Authenticated By: Conchita Paris, M.D.    Dg Abd Portable 1v  08/21/2012  *RADIOLOGY REPORT*  Clinical Data: The NG tube placement.  PORTABLE ABDOMEN - 1 VIEW  Comparison: 08/21/2012  Findings: NG tube coils in the stomach.  Continued small bowel distention with gas and contrast material, not significantly changed since prior CT.  Small amount of colonic  gas noted.  No free air organomegaly.  IMPRESSION: NG tube coils in the stomach.  Continued high-grade small bowel obstruction pattern.   Original Report Authenticated By: Rolm Baptise, M.D.     Medications:    . cloNIDine  0.2 mg Transdermal Weekly  . heparin  5,000 Units Subcutaneous Q8H  . influenza  inactive virus vaccine  0.5 mL Intramuscular Tomorrow-1000  . insulin aspart  0-15 Units Subcutaneous Q4H  . insulin glargine  20 Units Subcutaneous QHS    Assessment/Plan 1. Small bowel obstruction with history of prior ventral hernia repair and prostatectomy.  2. Adult diabetes type 2  3.Diabetic Retinopathy/neuropathy  4.dyslipidemia  5.Visual impairment.  6. Hx of prostate CA 7. Abnormal EKG with T wave changes lateral leads, 1 degree AV block, repeated by Dr. Rockne Menghini and recommendation of ASA at D/C.   Plan:  Continue NG, increase IV hydration, check film, if he looks better on film, start ambulation.  Watch labs.     LOS: 1 day    [name] 08/22/2012  Pt seen and examined.  He denies any pain this am.  He has had only minimal output from NG and abdominal distension only slightly improved.  His physical exam is much improved since he does not have the tenderness.  I explained that I am still concerned that he will need surgery but since he is feeling better, he would like to try nonoperative management for another day.  EKG changes noted by Dr. Rockne Menghini.

## 2012-08-23 ENCOUNTER — Encounter (HOSPITAL_COMMUNITY): Payer: Self-pay | Admitting: Anesthesiology

## 2012-08-23 ENCOUNTER — Inpatient Hospital Stay (HOSPITAL_COMMUNITY): Payer: Medicare Other | Admitting: Anesthesiology

## 2012-08-23 ENCOUNTER — Inpatient Hospital Stay (HOSPITAL_COMMUNITY): Payer: Medicare Other

## 2012-08-23 ENCOUNTER — Encounter (HOSPITAL_COMMUNITY): Admission: EM | Disposition: A | Discharge status: Home / Self Care | Payer: Self-pay | Source: Home / Self Care

## 2012-08-23 DIAGNOSIS — K565 Intestinal adhesions [bands], unspecified as to partial versus complete obstruction: Secondary | ICD-10-CM

## 2012-08-23 HISTORY — PX: LYSIS OF ADHESION: SHX5961

## 2012-08-23 HISTORY — PX: LAPAROTOMY: SHX154

## 2012-08-23 LAB — CBC
HCT: 39.5 % (ref 39.0–52.0)
MCHC: 30.1 g/dL (ref 30.0–36.0)
Platelets: 130 10*3/uL — ABNORMAL LOW (ref 150–400)
RDW: 15.3 % (ref 11.5–15.5)
WBC: 6.9 10*3/uL (ref 4.0–10.5)

## 2012-08-23 LAB — GLUCOSE, CAPILLARY
Glucose-Capillary: 73 mg/dL (ref 70–99)
Glucose-Capillary: 93 mg/dL (ref 70–99)
Glucose-Capillary: 129 mg/dL — ABNORMAL HIGH (ref 70–99)
Glucose-Capillary: 150 mg/dL — ABNORMAL HIGH (ref 70–99)

## 2012-08-23 LAB — BASIC METABOLIC PANEL
BUN: 12 mg/dL (ref 6–23)
GFR calc Af Amer: 79 mL/min — ABNORMAL LOW (ref >90)
GFR calc non Af Amer: 68 mL/min — ABNORMAL LOW (ref >90)
Potassium: 3.8 mEq/L (ref 3.5–5.1)
Sodium: 140 mEq/L (ref 135–145)

## 2012-08-23 SURGERY — LAPAROTOMY, EXPLORATORY
Anesthesia: General | Site: Abdomen | Wound class: Clean Contaminated

## 2012-08-23 MED ORDER — DIPHENHYDRAMINE HCL 12.5 MG/5ML PO ELIX: 12.5000 mg | ORAL_SOLUTION | Freq: Four times a day (QID) | ORAL | Status: DC | PRN

## 2012-08-23 MED ORDER — HYDROMORPHONE HCL PF 1 MG/ML IJ SOLN
INTRAMUSCULAR | Status: AC
  Filled 2012-08-23: qty 1

## 2012-08-23 MED ORDER — LACTATED RINGERS IV SOLN
INTRAVENOUS | Status: DC
  Administered 2012-08-23: 1000 mL via INTRAVENOUS
  Administered 2012-08-23: 12:00:00 via INTRAVENOUS

## 2012-08-23 MED ORDER — CEFAZOLIN SODIUM-DEXTROSE 2-3 GM-% IV SOLR
2.0000 g | Freq: Once | INTRAVENOUS | Status: AC
  Administered 2012-08-23: 2 g via INTRAVENOUS
  Filled 2012-08-23: qty 50

## 2012-08-23 MED ORDER — PROPOFOL 10 MG/ML IV BOLUS
INTRAVENOUS | Status: DC | PRN
  Administered 2012-08-23: 40 mg via INTRAVENOUS
  Administered 2012-08-23: 160 mg via INTRAVENOUS

## 2012-08-23 MED ORDER — HYDROMORPHONE HCL PF 1 MG/ML IJ SOLN
0.2500 mg | INTRAMUSCULAR | Status: DC | PRN
  Administered 2012-08-23 (×3): 0.5 mg via INTRAVENOUS

## 2012-08-23 MED ORDER — PROMETHAZINE HCL 25 MG/ML IJ SOLN: 6.2500 mg | INTRAMUSCULAR | Status: DC | PRN

## 2012-08-23 MED ORDER — NEOSTIGMINE METHYLSULFATE 1 MG/ML IJ SOLN
INTRAMUSCULAR | Status: DC | PRN
  Administered 2012-08-23: 5 mg via INTRAVENOUS

## 2012-08-23 MED ORDER — LEVALBUTEROL HCL 0.63 MG/3ML IN NEBU
0.6300 mg | INHALATION_SOLUTION | Freq: Once | RESPIRATORY_TRACT | Status: AC
  Administered 2012-08-23: 0.63 mg via RESPIRATORY_TRACT
  Filled 2012-08-23: qty 3

## 2012-08-23 MED ORDER — GLYCOPYRROLATE 0.2 MG/ML IJ SOLN
INTRAMUSCULAR | Status: DC | PRN
  Administered 2012-08-23: .8 mg via INTRAVENOUS

## 2012-08-23 MED ORDER — 0.9 % SODIUM CHLORIDE (POUR BTL) OPTIME
TOPICAL | Status: DC | PRN
  Administered 2012-08-23 (×2): 1000 mL

## 2012-08-23 MED ORDER — MORPHINE SULFATE (PF) 1 MG/ML IV SOLN
INTRAVENOUS | Status: AC
  Administered 2012-08-23: 2 mg
  Filled 2012-08-23: qty 25

## 2012-08-23 MED ORDER — DIPHENHYDRAMINE HCL 50 MG/ML IJ SOLN: 12.5000 mg | Freq: Four times a day (QID) | INTRAMUSCULAR | Status: DC | PRN

## 2012-08-23 MED ORDER — MORPHINE SULFATE (PF) 1 MG/ML IV SOLN
INTRAVENOUS | Status: DC
  Administered 2012-08-23: 14:00:00 via INTRAVENOUS
  Administered 2012-08-23: 6 mg via INTRAVENOUS
  Administered 2012-08-24 (×2): 4.5 mg via INTRAVENOUS
  Administered 2012-08-24: 6 mg via INTRAVENOUS
  Administered 2012-08-24: 14.24 mg via INTRAVENOUS
  Administered 2012-08-24: 7.5 mg via INTRAVENOUS
  Administered 2012-08-25: 10.5 mg via INTRAVENOUS
  Administered 2012-08-25 (×2): via INTRAVENOUS
  Administered 2012-08-25: 4.5 mg via INTRAVENOUS
  Administered 2012-08-26: 7.5 mg via INTRAVENOUS
  Administered 2012-08-26: 3 mg via INTRAVENOUS
  Administered 2012-08-26: 7.5 mg via INTRAVENOUS
  Administered 2012-08-26: 1.5 mg via INTRAVENOUS
  Administered 2012-08-26: 3 mg via INTRAVENOUS
  Administered 2012-08-27: 4.51 mg via INTRAVENOUS
  Administered 2012-08-27: 4.21 mg via INTRAVENOUS
  Filled 2012-08-23 (×4): qty 25

## 2012-08-23 MED ORDER — NALOXONE HCL 0.4 MG/ML IJ SOLN: 0.4000 mg | INTRAMUSCULAR | Status: DC | PRN

## 2012-08-23 MED ORDER — MIDAZOLAM HCL 5 MG/5ML IJ SOLN
INTRAMUSCULAR | Status: DC | PRN
  Administered 2012-08-23: 2 mg via INTRAVENOUS

## 2012-08-23 MED ORDER — LIP MEDEX EX OINT
TOPICAL_OINTMENT | CUTANEOUS | Status: AC
  Administered 2012-08-23: 08:00:00
  Filled 2012-08-23: qty 7

## 2012-08-23 MED ORDER — SUFENTANIL CITRATE 50 MCG/ML IV SOLN
INTRAVENOUS | Status: DC | PRN
  Administered 2012-08-23 (×2): 5 ug via INTRAVENOUS
  Administered 2012-08-23 (×2): 10 ug via INTRAVENOUS
  Administered 2012-08-23: 15 ug via INTRAVENOUS
  Administered 2012-08-23: 20 ug via INTRAVENOUS
  Administered 2012-08-23: 5 ug via INTRAVENOUS

## 2012-08-23 MED ORDER — SODIUM CHLORIDE 0.9 % IJ SOLN: 9.0000 mL | INTRAMUSCULAR | Status: DC | PRN

## 2012-08-23 MED ORDER — ONDANSETRON HCL 4 MG/2ML IJ SOLN
INTRAMUSCULAR | Status: DC | PRN
  Administered 2012-08-23: 4 mg via INTRAVENOUS

## 2012-08-23 MED ORDER — LIDOCAINE HCL (CARDIAC) 20 MG/ML IV SOLN
INTRAVENOUS | Status: DC | PRN
  Administered 2012-08-23: 60 mg via INTRAVENOUS

## 2012-08-23 MED ORDER — LACTATED RINGERS IV SOLN
INTRAVENOUS | Status: DC
  Administered 2012-08-23: 1000 mL via INTRAVENOUS
  Administered 2012-08-23: 13:00:00 via INTRAVENOUS

## 2012-08-23 MED ORDER — ONDANSETRON HCL 4 MG/2ML IJ SOLN: 4.0000 mg | Freq: Four times a day (QID) | INTRAMUSCULAR | Status: DC | PRN

## 2012-08-23 MED ORDER — DEXAMETHASONE SODIUM PHOSPHATE 10 MG/ML IJ SOLN
INTRAMUSCULAR | Status: DC | PRN
  Administered 2012-08-23: 5 mg via INTRAVENOUS

## 2012-08-23 MED ORDER — ROCURONIUM BROMIDE 100 MG/10ML IV SOLN
INTRAVENOUS | Status: DC | PRN
  Administered 2012-08-23: 5 mg via INTRAVENOUS
  Administered 2012-08-23: 35 mg via INTRAVENOUS
  Administered 2012-08-23: 10 mg via INTRAVENOUS

## 2012-08-23 MED ORDER — SUCCINYLCHOLINE CHLORIDE 20 MG/ML IJ SOLN
INTRAMUSCULAR | Status: DC | PRN
  Administered 2012-08-23: 100 mg via INTRAVENOUS

## 2012-08-23 MED ORDER — LABETALOL HCL 5 MG/ML IV SOLN
INTRAVENOUS | Status: DC | PRN
  Administered 2012-08-23 (×2): 5 mg via INTRAVENOUS

## 2012-08-23 MED ORDER — SODIUM CHLORIDE 0.9 % IV SOLN
INTRAVENOUS | Status: DC
  Administered 2012-08-24 – 2012-08-25 (×5): via INTRAVENOUS

## 2012-08-23 SURGICAL SUPPLY — 51 items
APPLICATOR COTTON TIP 6IN STRL (MISCELLANEOUS) IMPLANT
BLADE EXTENDED COATED 6.5IN (ELECTRODE) ×3 IMPLANT
BLADE HEX COATED 2.75 (ELECTRODE) ×3 IMPLANT
CANISTER SUCTION 2500CC (MISCELLANEOUS) ×3 IMPLANT
CHLORAPREP W/TINT 26ML (MISCELLANEOUS) ×3 IMPLANT
CLOTH BEACON ORANGE TIMEOUT ST (SAFETY) ×3 IMPLANT
COVER MAYO STAND STRL (DRAPES) ×3 IMPLANT
DRAPE LAPAROSCOPIC ABDOMINAL (DRAPES) ×3 IMPLANT
DRAPE WARM FLUID 44X44 (DRAPE) ×3 IMPLANT
DRSG PAD ABDOMINAL 8X10 ST (GAUZE/BANDAGES/DRESSINGS) ×3 IMPLANT
ELECT REM PT RETURN 9FT ADLT (ELECTROSURGICAL) ×3
ELECTRODE REM PT RTRN 9FT ADLT (ELECTROSURGICAL) ×2 IMPLANT
GLOVE BIO SURGEON STRL SZ7 (GLOVE) IMPLANT
GLOVE BIOGEL PI IND STRL 6.5 (GLOVE) ×4 IMPLANT
GLOVE BIOGEL PI IND STRL 7.0 (GLOVE) IMPLANT
GLOVE BIOGEL PI INDICATOR 6.5 (GLOVE) ×2
GLOVE BIOGEL PI INDICATOR 7.0 (GLOVE)
GLOVE ECLIPSE 6.5 STRL STRAW (GLOVE) ×3 IMPLANT
GLOVE SS BIOGEL STRL SZ 7 (GLOVE) ×4 IMPLANT
GLOVE SUPERSENSE BIOGEL SZ 7 (GLOVE) ×2
GLOVE SURG SS PI 6.5 STRL IVOR (GLOVE) ×3 IMPLANT
GLOVE SURG SS PI 7.5 STRL IVOR (GLOVE) ×6 IMPLANT
GOWN PREVENTION PLUS LG XLONG (DISPOSABLE) ×3 IMPLANT
GOWN STRL NON-REIN LRG LVL3 (GOWN DISPOSABLE) ×6 IMPLANT
GOWN STRL REIN XL XLG (GOWN DISPOSABLE) ×3 IMPLANT
KIT BASIN OR (CUSTOM PROCEDURE TRAY) ×3 IMPLANT
LIGASURE IMPACT 36 18CM CVD LR (INSTRUMENTS) IMPLANT
NS IRRIG 1000ML POUR BTL (IV SOLUTION) ×6 IMPLANT
PACK GENERAL/GYN (CUSTOM PROCEDURE TRAY) ×3 IMPLANT
SCALPEL HARMONIC ACE (MISCELLANEOUS) IMPLANT
SHEARS FOC LG CVD HARMONIC 17C (MISCELLANEOUS) IMPLANT
SPONGE GAUZE 4X4 12PLY (GAUZE/BANDAGES/DRESSINGS) ×3 IMPLANT
SPONGE LAP 18X18 X RAY DECT (DISPOSABLE) IMPLANT
STAPLER VISISTAT 35W (STAPLE) ×3 IMPLANT
SUCTION POOLE TIP (SUCTIONS) ×3 IMPLANT
SUT PDS AB 1 CTX 36 (SUTURE) IMPLANT
SUT PDS AB 1 TP1 96 (SUTURE) ×6 IMPLANT
SUT SILK 2 0 (SUTURE) ×1
SUT SILK 2 0 SH CR/8 (SUTURE) ×3 IMPLANT
SUT SILK 2-0 18XBRD TIE 12 (SUTURE) ×2 IMPLANT
SUT SILK 3 0 (SUTURE) ×1
SUT SILK 3 0 SH CR/8 (SUTURE) ×3 IMPLANT
SUT SILK 3-0 18XBRD TIE 12 (SUTURE) ×2 IMPLANT
SUT VIC AB 2-0 SH 18 (SUTURE) ×3 IMPLANT
SUT VIC AB 3-0 SH 8-18 (SUTURE) ×6 IMPLANT
TAPE CLOTH SURG 6X10 WHT LF (GAUZE/BANDAGES/DRESSINGS) ×3 IMPLANT
TOWEL OR 17X26 10 PK STRL BLUE (TOWEL DISPOSABLE) ×3 IMPLANT
TOWEL OR NON WOVEN STRL DISP B (DISPOSABLE) ×3 IMPLANT
TRAY FOLEY CATH 14FRSI W/METER (CATHETERS) ×3 IMPLANT
TUBING CONNECTING 10 (TUBING) ×3 IMPLANT
YANKAUER SUCT BULB TIP NO VENT (SUCTIONS) ×3 IMPLANT

## 2012-08-23 NOTE — Progress Notes (Signed)
Patient  Just clearing throat occasionally- no wheezing noted at this time.

## 2012-08-23 NOTE — Progress Notes (Signed)
Patient sounded wheezey in upper throat- Dr. Kalman Shan in- checked patient- Xopenex 0.63mg  Nebulizer treatment given as ordered- patient tolerated treatment well

## 2012-08-23 NOTE — Progress Notes (Signed)
Subjective: He says he feels better but taking dilaudid regularly, no flatus, no BM, not walking much either.  Objective: Vital signs in last 24 hours: Temp:  [98.4 F (36.9 C)-100.3 F (37.9 C)] 98.7 F (37.1 C) (01/09 0557) Pulse Rate:  [74-88] 74  (01/09 0557) Resp:  [18] 18  (01/09 0557) BP: (133-146)/(60-76) 133/60 mmHg (01/09 0557) SpO2:  [92 %-100 %] 98 % (01/09 0557) Last BM Date: 08/20/12 Over 600 ml in the NG, no flatus or BM,  NPO, TM99, VSS, labs OK  Intake/Output from previous day: 01/08 0701 - 01/09 0700 In: 2300 [I.V.:2300] Out: 1025 [Urine:525; Emesis/NG output:500] Intake/Output this shift:    General appearance: alert, cooperative, no distress and still fairly uncomfortable Resp: rales both bases GI: still distended, not really tender on palpation, but he says he feel it when you press on abd.  No flatus/BM  Lab Results:   Basename 08/23/12 0455 08/22/12 0545  WBC 6.9 5.7  HGB 11.9* 12.7*  HCT 39.5 40.2  PLT 130* 165    BMET  Basename 08/23/12 0455 08/22/12 0545  NA 140 137  K 3.8 3.9  CL 107 103  CO2 24 27  GLUCOSE 88 126*  BUN 12 15  CREATININE 1.12 1.20  CALCIUM 8.3* 8.7   PT/INR No results found for this basename: LABPROT:2,INR:2 in the last 72 hours   Lab 08/21/12 0406  AST 40*  ALT 20  ALKPHOS 140*  BILITOT 0.7  PROT 7.6  ALBUMIN 4.0     Lipase     Component Value Date/Time   LIPASE 14 08/21/2012 0406     Studies/Results: Ct Abdomen Pelvis W Contrast  08/21/2012  *RADIOLOGY REPORT*  Clinical Data: Abdominal distention and pain, nausea and vomiting. History of prostate cancer status post radical prostatectomy and hernia repair.  CT ABDOMEN AND PELVIS WITH CONTRAST  Technique:  Multidetector CT imaging of the abdomen and pelvis was performed following the standard protocol during bolus administration of intravenous contrast.  Contrast: 139mL OMNIPAQUE IOHEXOL 300 MG/ML  SOLN  Comparison: 12/29/2007  Findings: Minimal  dependent bibasilar scarring or atelectasis again noted.  5 mm nodule abutting the left hemidiaphragm image 10 is stable.  There is a mid small bowel dilatation to the level of an abrupt transition point subjacent to the umbilicus, image 51 series 2. Distal small bowel and colonic decompression noted.  Trace fluid tracks along the right pericolic gutter.  No extrinsic mass lesion is identified.  No mass within the bowel is identified.  No free air.  Liver, gallbladder, adrenal glands, left kidney, spleen, and pancreas are unremarkable.  1.2 cm right upper renal pole cortical cyst incidentally noted.  The appendix is normal.  Bladder is normal.  Clips along the pelvic sidewalls are compatible with prior prostatectomy.  No pelvic mass is identified.  Trace free pelvic fluid is identified image 60.  No lymphadenopathy.  No lytic or sclerotic osseous lesion or acute osseous abnormality. Lumbar spine degenerative changes are again noted.  Mild right hip degenerative change.  IMPRESSION: Small bowel obstruction to the level of the distal mid small bowel with abrupt transition point immediately adjacent to the umbilicus. This could be due to adhesion or an occult intrinsic or extrinsic mass lesion.  Free fluid is present without loculation to suggest abscess formation, and no free air is identified.  This does confer a risk of bowel ischemia.  Status post radical prostatectomy without evidence for local recurrence or intra-abdominal/pelvic metastatic disease.   Original  Report Authenticated By: Conchita Paris, M.D.    Dg Abd 2 Views  08/22/2012  *RADIOLOGY REPORT*  Clinical Data: Follow up of small bowel obstruction.  Abdominal pain.  ABDOMEN - 2 VIEW  Comparison: 1 day prior and is CT of 08/22/2011.  Findings: Upright and supine views.  The upright view demonstrates nasogastric tube to be looped in the stomach with the tip at the gastric fundus.  Numerous small bowel air fluid levels. No free intraperitoneal air. There  may be right base air space disease, suboptimally evaluated.  Supine view demonstrates persistent small bowel distention at up to 4.5 cm within the left side abdomen.  Unchanged.  There is contrast within the colon, which is normal in caliber.  Distal gas identified.  Surgical clips in the pelvis.  No pneumatosis.  IMPRESSION: Similar partial small bowel obstruction, without complicating free intraperitoneal air.   Original Report Authenticated By: Abigail Miyamoto, M.D.    Dg Abd Portable 1v  08/21/2012  *RADIOLOGY REPORT*  Clinical Data: The NG tube placement.  PORTABLE ABDOMEN - 1 VIEW  Comparison: 08/21/2012  Findings: NG tube coils in the stomach.  Continued small bowel distention with gas and contrast material, not significantly changed since prior CT.  Small amount of colonic gas noted.  No free air organomegaly.  IMPRESSION: NG tube coils in the stomach.  Continued high-grade small bowel obstruction pattern.   Original Report Authenticated By: Rolm Baptise, M.D.     Medications:    . cloNIDine  0.2 mg Transdermal Weekly  . heparin  5,000 Units Subcutaneous Q8H  . insulin aspart  0-15 Units Subcutaneous Q4H  . insulin glargine  20 Units Subcutaneous QHS  . lip balm        Assessment/Plan 1. Small bowel obstruction with history of prior ventral hernia repair and prostatectomy.  2. Adult diabetes type 2  3.Diabetic Retinopathy/neuropathy  4.dyslipidemia  5.Visual impairment.  6. Hx of prostate CA  7. Abnormal EKG with T wave changes lateral leads, 1 degree AV block, repeated by Dr. Rockne Menghini and recommendation of ASA at D/C.   Plan:  Film is pending, I don't think on exam he is showing any real improvement.  LOS: 2 days    AllenAllen [name] 08/23/2012 He is having some crampy abdominal pain this am.  He has some bilious fluid from NG.  His abdominal exam is really not much improved.  I think that if he was going to improve with nonop management, that he would have made some more progress by now  but exam unchanged, and no flatus or BM.  I have recommended dx laparotomy with LOA.  We discussed the risks of infection, bleeding, pain, scarring, recurrence, ileus, bowel injury and need for bowel resection.  He expressed understanding and desires to proceed with ex. Lap/LOA

## 2012-08-23 NOTE — Anesthesia Postprocedure Evaluation (Signed)
  Anesthesia Post-op Note  Patient: [name]  Procedure(s) Performed: Procedure(s) (LRB): EXPLORATORY LAPAROTOMY (N/A) LYSIS OF ADHESION ()  Patient Location: PACU  Anesthesia Type: General  Level of Consciousness: awake and alert   Airway and Oxygen Therapy: Patient Spontanous Breathing  Post-op Pain: mild  Post-op Assessment: Post-op Vital signs reviewed, Patient's Cardiovascular Status Stable, Respiratory Function Stable, Patent Airway and No signs of Nausea or vomiting  Last Vitals:  Filed Vitals:   08/23/12 1303  BP: 153/63  Pulse:   Temp: 37.1 C  Resp: 12    Post-op Vital Signs: stable   Complications: No apparent anesthesia complications

## 2012-08-23 NOTE — Progress Notes (Signed)
Patient ID: TENNIS FAHEY, male   DOB: 05-Mar-1951, 62 y.o.   MRN: [id number]  [facility] PROGRESS NOTE  OSHUA DEOLIVEIRA [id number] DOB: [birth year] DOA: 08/21/2012 PCP: No primary provider on file.  Brief narrative:  Mr. Hanratty is a 62 year old man with past medical history of diabetes, diabetic retinopathy, dyslipidemia, and prior prostatectomy as well as ventral hernia repair who was admitted by Dr. Lilyan Punt of the surgical service for treatment of a small bowel obstruction, confirmed by CT scans. The patient states that he normally manages his diabetes with 20 units of Lantus daily as well as oral hypoglycemics. He does not regularly check his blood glucoses. Has a glucose machine but no strips. He does not currently have a primary care doctor. Used to see Dr. Carlis Abbott. Ran out of all of his medications 2 months ago except for the Lantus, which he takes sporadically.   Principal Problem:  *Small bowel obstruction  Management per primary team. Surgery planned for today Active Problems:  Chest pain/abnormal EKG  Chest pain now resolved. Will consider Aspirin upon discharge. Pt denies chest pain this AM. DIABETES MELLITUS, TYPE II, UNCONTROLLED  Consulted diabetes coordinator and case manager to help with aftercare.  A1C > 16  Started on moderate scale sliding scale insulin while n.p.o. Started Lantus 20 units each bedtime.  Responding well to current therapy.  HYPERLIPIDEMIA, MIXED  Patient will need a prescription for his statin therapy and case manager has been consulted to help find him a physician. HYPERTENSION  Continue clonidine patch since currently n.p.o.Will plan on transitioning to PO once pt able to tolerate   Procedures/Studies:  Ct Abdomen Pelvis W Contrast  08/21/2012  Small bowel obstruction to the level of the distal mid small bowel with abrupt transition point immediately adjacent to the umbilicus. This could be due to adhesion or an occult intrinsic or extrinsic  mass lesion. Free fluid is present without loculation to suggest abscess formation, and no free air is identified. This does confer a risk of bowel ischemia. Status post radical prostatectomy without evidence for local recurrence or intra-abdominal/pelvic metastatic disease.  Dg Abd 2 Views  08/22/2012  Similar partial small bowel obstruction, without complicating free intraperitoneal air.  Dg Abd Portable 1v  08/21/2012  NG tube coils in the stomach. Continued high-grade small bowel obstruction pattern.  Antibiotics:  None  Code Status: Full  Family Communication: Pt at bedside   HPI/Subjective: No events overnight.   Objective: Filed Vitals:   08/22/12 1849 08/22/12 2144 08/23/12 0155 08/23/12 0557  BP: 138/76 137/71 138/64 133/60  Pulse: 88 83 79 74  Temp: 100.3 F (37.9 C) 98.9 F (37.2 C) 99 F (37.2 C) 98.7 F (37.1 C)  TempSrc: Oral Oral Oral Oral  Resp: 18 18 18 18   Height:      Weight:      SpO2: 96% 92% 99% 98%    Intake/Output Summary (Last 24 hours) at 08/23/12 1028 Last data filed at 08/23/12 0731  Gross per 24 hour  Intake    180 ml  Output    825 ml  Net   -645 ml    Exam:   General:  Pt is alert, follows commands appropriately, not in acute distress  Cardiovascular: Regular rate and rhythm, S1/S2, no murmurs, no rubs, no gallops  Respiratory: Clear to auscultation bilaterally, no wheezing, no crackles, no rhonchi  Abdomen: Soft, non tender, still distended, bowel sounds present but high pitched, no guarding  Extremities: No edema, pulses DP  and PT palpable bilaterally  Neuro: Grossly nonfocal  Data Reviewed: Basic Metabolic Panel:  Lab 0000000 0455 08/22/12 0545 08/21/12 0622 08/21/12 0406  NA 140 137 130* 127*  K 3.8 3.9 4.7 5.9*  CL 107 103 94* 89*  CO2 24 27 23 22   GLUCOSE 88 126* 406* 405*  BUN 12 15 14 13   CREATININE 1.12 1.20 1.07 1.00  CALCIUM 8.3* 8.7 9.8 10.0  MG -- 1.9 -- --  PHOS -- -- -- --   Liver Function Tests:  Lab  08/21/12 0406  AST 40*  ALT 20  ALKPHOS 140*  BILITOT 0.7  PROT 7.6  ALBUMIN 4.0    Lab 08/21/12 0406  LIPASE 14  AMYLASE --   CBC:  Lab 08/23/12 0455 08/22/12 0545 08/21/12 0406  WBC 6.9 5.7 6.7  NEUTROABS -- -- 5.8  HGB 11.9* 12.7* 15.2  HCT 39.5 40.2 45.8  MCV 68.0* 66.3* 64.1*  PLT 130* 165 155   CBG:  Lab 08/23/12 0804 08/23/12 0343 08/22/12 2351 08/22/12 2024 08/22/12 1633  GLUCAP 101* 73 88 87 101*    Scheduled Meds:   .  ceFAZolin (ANCEF) IV  2 g Intravenous Once  . cloNIDine  0.2 mg Transdermal Weekly  . heparin  5,000 Units Subcutaneous Q8H  . insulin aspart  0-15 Units Subcutaneous Q4H  . insulin glargine  20 Units Subcutaneous QHS   Continuous Infusions:   . sodium chloride Stopped (08/23/12 1009)  . lactated ringers 1,000 mL (08/23/12 1011)     Faye Ramsay, MD  [facility] Pager [phone]  If 7PM-7AM, please contact night-coverage [email or web address] Password [email or web address] 08/23/2012, 10:28 AM   LOS: 2 days

## 2012-08-23 NOTE — Transfer of Care (Signed)
Immediate Anesthesia Transfer of Care Note  Patient: [name]  Procedure(s) Performed: Procedure(s) (LRB) with comments: EXPLORATORY LAPAROTOMY (N/A) - exploratory laparotomy with lysis of adhesions LYSIS OF ADHESION ()  Patient Location: PACU  Anesthesia Type:General  Level of Consciousness: awake, alert  and patient cooperative  Airway & Oxygen Therapy: Patient Spontanous Breathing and Patient connected to face mask oxygen  Post-op Assessment: Report given to PACU RN, Post -op Vital signs reviewed and stable and Patient moving all extremities X 4  Post vital signs: stable  Complications: No apparent anesthesia complications

## 2012-08-23 NOTE — Brief Op Note (Signed)
08/21/2012 - 08/23/2012  12:57 PM  PATIENT:  [name]  62 y.o. male  PRE-OPERATIVE DIAGNOSIS:  Bowel Obstruction   POST-OPERATIVE DIAGNOSIS:  Bowel Obstruction   PROCEDURE:  Procedure(s) (LRB) with comments: EXPLORATORY LAPAROTOMY (N/A) - exploratory laparotomy with lysis of adhesions LYSIS OF ADHESION ()  SURGEON:  Surgeon(s) and Role:    * [name], DO - Primary  PHYSICIAN ASSISTANT:   ASSISTANTS: none   ANESTHESIA:   general  EBL:  Total I/O In: 2180 [P.O.:120; I.V.:2000; NG/GT:60] Out: 350 [Urine:300; Blood:50]  BLOOD ADMINISTERED:none  DRAINS: none   LOCAL MEDICATIONS USED:  NONE  SPECIMEN:  No Specimen  DISPOSITION OF SPECIMEN:  N/A  COUNTS:  YES  TOURNIQUET:  * No tourniquets in log *  DICTATION: .Other Dictation: Dictation Number [id number]  PLAN OF CARE: Admit to inpatient   PATIENT DISPOSITION:  PACU - hemodynamically stable.   Delay start of Pharmacological VTE agent (>24hrs) due to surgical blood loss or risk of bleeding: no

## 2012-08-23 NOTE — Anesthesia Preprocedure Evaluation (Addendum)
Anesthesia Evaluation  Patient identified by MRN, date of birth, ID band Patient awake    Reviewed: Allergy & Precautions, H&P , NPO status , Patient's Chart, lab work & pertinent test results  Airway Mallampati: II TM Distance: <3 FB Neck ROM: Full    Dental No notable dental hx. (+) Poor Dentition, Missing and Loose   Pulmonary neg pulmonary ROS,  sarcoidosis breath sounds clear to auscultation  Pulmonary exam normal       Cardiovascular hypertension, Pt. on medications Rhythm:Regular Rate:Normal     Neuro/Psych negative neurological ROS  negative psych ROS   GI/Hepatic negative GI ROS, Neg liver ROS,   Endo/Other  diabetes, Insulin Dependent  Renal/GU negative Renal ROS  negative genitourinary   Musculoskeletal negative musculoskeletal ROS (+)   Abdominal   Peds negative pediatric ROS (+)  Hematology  (+) Blood dyscrasia, anemia ,   Anesthesia Other Findings   Reproductive/Obstetrics negative OB ROS                         Anesthesia Physical Anesthesia Plan  ASA: III  Anesthesia Plan: General   Post-op Pain Management:    Induction: Intravenous, Rapid sequence and Cricoid pressure planned  Airway Management Planned: Oral ETT  Additional Equipment:   Intra-op Plan:   Post-operative Plan: Extubation in OR  Informed Consent: I have reviewed the patients History and Physical, chart, labs and discussed the procedure including the risks, benefits and alternatives for the proposed anesthesia with the patient or authorized representative who has indicated his/her understanding and acceptance.   Dental advisory given  Plan Discussed with: CRNA and Surgeon  Anesthesia Plan Comments: (No tylenol)       Anesthesia Quick Evaluation

## 2012-08-23 NOTE — Progress Notes (Signed)
Dr. Marcell Barlow made aware of patient's CBG results in PACU- 77- CBG to be rechecked in 1 hour.

## 2012-08-24 ENCOUNTER — Encounter (HOSPITAL_COMMUNITY): Payer: Self-pay | Admitting: General Surgery

## 2012-08-24 ENCOUNTER — Inpatient Hospital Stay (HOSPITAL_COMMUNITY): Payer: Medicare Other

## 2012-08-24 LAB — BASIC METABOLIC PANEL WITH GFR
BUN: 14 mg/dL (ref 6–23)
CO2: 25 meq/L (ref 19–32)
Calcium: 7.9 mg/dL — ABNORMAL LOW (ref 8.4–10.5)
Chloride: 106 meq/L (ref 96–112)
Creatinine, Ser: 1.12 mg/dL (ref 0.50–1.35)
GFR calc Af Amer: 79 mL/min — ABNORMAL LOW
GFR calc non Af Amer: 68 mL/min — ABNORMAL LOW
Glucose, Bld: 150 mg/dL — ABNORMAL HIGH (ref 70–99)
Potassium: 3.9 meq/L (ref 3.5–5.1)
Sodium: 139 meq/L (ref 135–145)

## 2012-08-24 LAB — CBC
MCH: 20.6 pg — ABNORMAL LOW (ref 26.0–34.0)
MCV: 67.7 fL — ABNORMAL LOW (ref 78.0–100.0)
Platelets: 140 10*3/uL — ABNORMAL LOW (ref 150–400)
RBC: 5.54 MIL/uL (ref 4.22–5.81)
RDW: 15.3 % (ref 11.5–15.5)

## 2012-08-24 LAB — GLUCOSE, CAPILLARY
Glucose-Capillary: 128 mg/dL — ABNORMAL HIGH (ref 70–99)
Glucose-Capillary: 100 mg/dL — ABNORMAL HIGH (ref 70–99)
Glucose-Capillary: 96 mg/dL (ref 70–99)

## 2012-08-24 MED ORDER — ACETAMINOPHEN 10 MG/ML IV SOLN
1000.0000 mg | Freq: Four times a day (QID) | INTRAVENOUS | Status: AC
  Administered 2012-08-24 – 2012-08-25 (×4): 1000 mg via INTRAVENOUS
  Filled 2012-08-24 (×4): qty 100

## 2012-08-24 NOTE — Addendum Note (Signed)
Addendum  created 08/24/12 1015 by Montez Hageman, MD   Modules edited:Anesthesia Responsible Staff

## 2012-08-24 NOTE — Progress Notes (Signed)
Patient ID: [name], male   DOB: 11/07/1950, 62 y.o.   MRN: [id number]  [facility] PROGRESS NOTE  [name] [id number] DOB: Jan 09, 1965 DOA: 08/21/2012 PCP: No primary provider on file.  Brief narrative:  [name] is a 62 year old man with past medical history of diabetes, diabetic retinopathy, dyslipidemia, and prior prostatectomy as well as ventral hernia repair who was admitted by Dr. Lilyan Punt of the surgical service for treatment of a small bowel obstruction, confirmed by CT scans. The patient states that he normally manages his diabetes with 20 units of Lantus daily as well as oral hypoglycemics. He does not regularly check his blood glucoses. Has a glucose machine but no strips. He does not currently have a primary care doctor. Used to see Dr. Carlis Abbott. Ran out of all of his medications 2 months ago except for the Lantus, which he takes sporadically.   Principal Problem:  *Small bowel obstruction  Management per primary team. Pt is status post ex lap with lysis of adhesion. Active Problems:  Chest pain/abnormal EKG  Chest pain now resolved. Will consider Aspirin upon discharge. Pt denies chest pain this AM. DIABETES MELLITUS, TYPE II, UNCONTROLLED  Consulted diabetes coordinator and case manager to help with aftercare.  A1C > 16  Started on moderate scale sliding scale insulin while n.p.o. Started Lantus 20 units each bedtime.  Responding well to current therapy. CBG's in low 100's HYPERLIPIDEMIA, MIXED  Patient will need a prescription for his statin therapy and case manager has been consulted to help find him a physician. HYPERTENSION  Continue clonidine patch since currently n.p.o.Will plan on transitioning to PO once pt able to tolerate  Procedures/Studies:  Ct Abdomen Pelvis W Contrast  08/21/2012  Small bowel obstruction to the level of the distal mid small bowel with abrupt transition point immediately adjacent to the umbilicus. This could be due to adhesion  or an occult intrinsic or extrinsic mass lesion. Free fluid is present without loculation to suggest abscess formation, and no free air is identified. This does confer a risk of bowel ischemia. Status post radical prostatectomy without evidence for local recurrence or intra-abdominal/pelvic metastatic disease.  Dg Abd 2 Views  08/22/2012  Similar partial small bowel obstruction, without complicating free intraperitoneal air.  Dg Abd Portable 1v  08/21/2012  NG tube coils in the stomach. Continued high-grade small bowel obstruction pattern.  Antibiotics:  None  Code Status: Full  Family Communication: Pt at bedside   HPI/Subjective: No events overnight. Pt denies chest pain or shortness of breath.  Objective: Filed Vitals:   08/24/12 0800 08/24/12 0959 08/24/12 1212 08/24/12 1447  BP:    129/70  Pulse:    72  Temp:    98.4 F (36.9 C)  TempSrc:    Oral  Resp: 17 17 14 16   Height:      Weight:      SpO2: 100% 100% 100% 100%    Intake/Output Summary (Last 24 hours) at 08/24/12 1526 Last data filed at 08/24/12 1225  Gross per 24 hour  Intake   3749 ml  Output    885 ml  Net   2864 ml    Exam:   General:  Pt is alert, follows commands appropriately, not in acute distress  Cardiovascular: Regular rate and rhythm, S1/S2, no murmurs, no rubs, no gallops  Respiratory: Clear to auscultation bilaterally, no wheezing, no crackles, no rhonchi  Abdomen: Soft, tender in epigastric area, slightly distended, NGT in place  Extremities: No edema,  pulses DP and PT palpable bilaterally  Neuro: Grossly nonfocal  Data Reviewed: Basic Metabolic Panel:  Lab [id number] 0450 08/23/12 0455 08/22/12 0545 08/21/12 0622 08/21/12 0406  NA 139 140 137 130* 127*  K 3.9 3.8 3.9 4.7 5.9*  CL 106 107 103 94* 89*  CO2 25 24 27 23 22   GLUCOSE 150* 88 126* 406* 405*  BUN 14 12 15 14 13   CREATININE 1.12 1.12 1.20 1.07 1.00  CALCIUM 7.9* 8.3* 8.7 9.8 10.0  MG -- -- 1.9 -- --  PHOS -- -- -- -- --    Liver Function Tests:  Lab 08/21/12 0406  AST 40*  ALT 20  ALKPHOS 140*  BILITOT 0.7  PROT 7.6  ALBUMIN 4.0    Lab 08/21/12 0406  LIPASE 14  AMYLASE --   No results found for this basename: AMMONIA:5 in the last 168 hours CBC:  Lab 08/24/12 0450 08/23/12 0455 08/22/12 0545 08/21/12 0406  WBC 5.8 6.9 5.7 6.7  NEUTROABS -- -- -- 5.8  HGB 11.4* 11.9* 12.7* 15.2  HCT 37.5* 39.5 40.2 45.8  MCV 67.7* 68.0* 66.3* 64.1*  PLT 140* 130* 165 155   Cardiac Enzymes: No results found for this basename: CKTOTAL:5,CKMB:5,CKMBINDEX:5,TROPONINI:5 in the last 168 hours BNP: No components found with this basename: POCBNP:5 CBG:  Lab 08/24/12 1220 08/24/12 0949 08/24/12 0356 08/23/12 2346 08/23/12 2000  GLUCAP 100* 100* 128* 156* 150*    No results found for this or any previous visit (from the past 240 hour(s)).   Scheduled Meds:   . acetaminophen  1,000 mg Intravenous Q6H  . cloNIDine  0.2 mg Transdermal Weekly  . heparin  5,000 Units Subcutaneous Q8H  . insulin aspart  0-15 Units Subcutaneous Q4H  . insulin glargine  20 Units Subcutaneous QHS  . morphine   Intravenous Q4H   Continuous Infusions:   . sodium chloride 125 mL/hr at 08/24/12 1020     Faye Ramsay, MD  [facility] Pager [phone]  If 7PM-7AM, please contact night-coverage [email or web address] Password [email or web address] 08/24/2012, 3:26 PM   LOS: 3 days

## 2012-08-24 NOTE — Op Note (Signed)
[name], [name] NO.:  [id number]  MEDICAL RECORD NO.:  [id number]  LOCATION:  [id number]                         FACILITY:  [facility]  PHYSICIAN:  [name], MD       DATE OF BIRTH:  [birth year]  DATE OF PROCEDURE:  08/23/2012 DATE OF DISCHARGE:                              OPERATIVE REPORT   PROCEDURE:  Exploratory laparotomy with lysis of adhesions.  PREOPERATIVE DIAGNOSIS:  Bowel obstruction.  POSTOPERATIVE DIAGNOSIS:  Bowel obstruction.  SURGEON:  [name], MD  ASSISTANT:  None.  ANESTHESIA:  General endotracheal anesthesia.  ESTIMATED BLOOD LOSS:  Minimal.  FLUIDS:  2300 mL of crystalloid.  SPECIMENS:  None.  COMPLICATIONS:  None apparent.  FINDINGS:  Convoluted loop of small intestine adhered to the abdominal wall infraumbilically under his prior prostate incision approximately 70 cm proximal to the ileocecal valve.  The adhesions were lysed and no other evidence of any bowel obstruction.  Small intestine was cleared from the ligament of Treitz to the ileocecal valve.  Two small serosal tears, which were oversewn with Lembert and Vicryl sutures.  INDICATIONS FOR PROCEDURE:  [name] is a 62 year old male, who was admitted with a small-bowel obstruction.  He had failure to improve after 48 hours of NG tube decompression and bowel rest, and has had persistent symptoms and we decided to proceed with exploratory laparotomy and lysis of adhesions.  OPERATIVE DETAILS:  [name] was seen and evaluated in the surgical ward and risks and benefits of procedure were discussed in lay terms. Informed consent was obtained.  He was taken to the operating room and given prophylactic antibiotics.  Foley catheter was placed.  The abdomen was prepped and draped in a standard surgical fashion.  I made a midline incision just cephalad to the umbilicus and just cephalad to his prior supraumbilical incision.  Dissection was carried down to the  abdominal wall and fascia using Bovie electrocautery.  The fascia was elevated and sharply incised and the peritoneum entered bluntly and from here, I could feel one of the fascia and opened the incision more caudad.  I carried this around the umbilicus and down to his prior infraumbilical incision, but just under the umbilicus, he had small bowel that was adhered to the abdominal wall.  I gradually took this down using sharp dissection and he had a couple of loops of small intestine which were adhered to the abdominal wall in this area.  I took down the adhesions and continued to carry the incision down more caudad until I was able to completely free the adhesions from the anterior abdominal wall and I was able to free the small bowel and it was clear at this point that this was the area of transition.  He had a convoluted loop of small intestine, which was adhered to itself and then it adhered to the abdominal wall as well and the distal small bowel was very decompressed and the proximal small bowel was dilated and thickened for a few feet, but then it seemed to normalize as I got more proximal towards the ligament of Treitz, almost as though he may have had a closed loop obstruction from another adhesion  as well, which was likely taken down during injury.  I was able to run the intestine from the ligament of Treitz to the ileocecal valve.  I broke down and lysed any interloop adhesions and ensuring that this area of small intestine, there were 2 open areas of small serosal tears which were oversewn with 3-0 Vicryl Lembert sutures.  I inspected the small bowel several times looking for any other evidence of bowel injury during the dissection and none was identified.  I irrigated the abdomen with sterile saline solution and replaced and palpated the NG tube and positioned it in the proper position in the stomach and approximated at the fascia with #1 looped PDS sutures x2 taking care to  avoid injury to the underlying bowel contents.  The subcutaneous tissue was irrigated and the skin edges were approximated with skin staples.  Skin was washed and dried and a sterile dressing was applied.  The patient tolerated the procedure well without apparent complication.          ______________________________ [name], MD     BL/MEDQ  D:  08/23/2012  T:  08/24/2012  Job:  [id number]

## 2012-08-24 NOTE — Progress Notes (Signed)
Foley cath removed per protocol, will monitor for voiding

## 2012-08-24 NOTE — Progress Notes (Signed)
1 Day Post-Op  Subjective: No real improvement he's still pretty sore, complains of mucus back of throat.  Objective: Vital signs in last 24 hours: Temp:  [98.4 F (36.9 C)-99.7 F (37.6 C)] 98.5 F (36.9 C) (01/10 0522) Pulse Rate:  [70-83] 75  (01/10 0522) Resp:  [9-20] 13  (01/10 0522) BP: (104-167)/(57-94) 133/72 mmHg (01/10 0522) SpO2:  [97 %-100 %] 100 % (01/10 0522) Last BM Date: 08/20/12  105 from NG, 850 urine, + 6 liters since admit, afebrile, VSS, labs OK,    Intake/Output from previous day: 08/29/2022 0701 - 01/10 0700 In: 6829 [P.O.:120; I.V.:6549; NG/GT:160] Out: 1005 [Urine:850; Emesis/NG output:105; Blood:50] Intake/Output this shift:    General appearance: alert, cooperative, no distress and looks trired. Resp: BS down some in base, GI: still a bit disteded, wound oK, some high pitched bowel sounds.  NG is green, it was not draining when I got there, Filter was green  with fluid in sump.  I think I have it working now.  Lab Results:   Basename 08/24/12 0450 August 29, 2012 0455  WBC 5.8 6.9  HGB 11.4* 11.9*  HCT 37.5* 39.5  PLT 140* 130*    BMET  Basename 08/24/12 0450 Aug 29, 2012 0455  NA 139 140  K 3.9 3.8  CL 106 107  CO2 25 24  GLUCOSE 150* 88  BUN 14 12  CREATININE 1.12 1.12  CALCIUM 7.9* 8.3*   PT/INR No results found for this basename: LABPROT:2,INR:2 in the last 72 hours   Lab 08/21/12 0406  AST 40*  ALT 20  ALKPHOS 140*  BILITOT 0.7  PROT 7.6  ALBUMIN 4.0     Lipase     Component Value Date/Time   LIPASE 14 08/21/2012 0406     Studies/Results: Dg Abd 2 Views  Aug 29, 2012  *RADIOLOGY REPORT*  Clinical Data: Bowel obstruction, history diabetes, prostate cancer  ABDOMEN - 2 VIEW  Comparison: 08/22/2012  Findings: Nasogastric tube coiled in stomach. Retained contrast right colon. Persistent dilatation of small bowel loops in mid abdomen compatible with small bowel obstruction. Question mild bowel wall thickening of a mid abdominal small bowel  loop. Bilateral pelvic surgical clips. No definite urinary tract calcification or acute osseous findings. Degenerative disc and facet disease changes lower lumbar spine.  IMPRESSION: Persistent small bowel dilatation compatible with obstruction. Question mild bowel wall thickening of a mid abdominal small bowel loop.   Original Report Authenticated By: Lavonia Dana, M.D.    Dg Abd 2 Views  08/22/2012  *RADIOLOGY REPORT*  Clinical Data: Follow up of small bowel obstruction.  Abdominal pain.  ABDOMEN - 2 VIEW  Comparison: 1 day prior and is CT of 08/22/2011.  Findings: Upright and supine views.  The upright view demonstrates nasogastric tube to be looped in the stomach with the tip at the gastric fundus.  Numerous small bowel air fluid levels. No free intraperitoneal air. There may be right base air space disease, suboptimally evaluated.  Supine view demonstrates persistent small bowel distention at up to 4.5 cm within the left side abdomen.  Unchanged.  There is contrast within the colon, which is normal in caliber.  Distal gas identified.  Surgical clips in the pelvis.  No pneumatosis.  IMPRESSION: Similar partial small bowel obstruction, without complicating free intraperitoneal air.   Original Report Authenticated By: Abigail Miyamoto, M.D.     Medications:    . cloNIDine  0.2 mg Transdermal Weekly  . heparin  5,000 Units Subcutaneous Q8H  . insulin aspart  0-15  Units Subcutaneous Q4H  . insulin glargine  20 Units Subcutaneous QHS  . morphine   Intravenous Q4H    Assessment/Plan Bowel obstruction. S/p Exploratory laparotomy with lysis of adhesions. [name], DO, 08/24/2012  1. Small bowel obstruction with history of prior ventral hernia repair and prostatectomy.  2. Adult diabetes type 2  3.Diabetic Retinopathy/neuropathy  4.dyslipidemia  5.Visual impairment.  6. Hx of prostate CA  7. Abnormal EKG with T wave changes lateral leads, 1 degree AV block, repeated by Dr. Rockne Menghini and recommendation of  ASA at D/C  Plan:  Add IV tylenol to PCA, IS, he tried to walk last PM, he says he's not sure why.  Continue NG drainage for now.        LOS: 3 days    [name],WILLARD 08/24/2012  I have seen and evaluated him and he looks fine.  He is appropriately tender and his NG hasnt put out much.  He is still distended as expected.  Await return of bowel function.

## 2012-08-25 ENCOUNTER — Inpatient Hospital Stay (HOSPITAL_COMMUNITY): Payer: Medicare Other

## 2012-08-25 LAB — GLUCOSE, CAPILLARY
Glucose-Capillary: 79 mg/dL (ref 70–99)
Glucose-Capillary: 68 mg/dL — ABNORMAL LOW (ref 70–99)
Glucose-Capillary: 98 mg/dL (ref 70–99)
Glucose-Capillary: 66 mg/dL — ABNORMAL LOW (ref 70–99)
Glucose-Capillary: 112 mg/dL — ABNORMAL HIGH (ref 70–99)

## 2012-08-25 LAB — BASIC METABOLIC PANEL
BUN: 12 mg/dL (ref 6–23)
CO2: 21 mEq/L (ref 19–32)
Chloride: 108 mEq/L (ref 96–112)
Creatinine, Ser: 1.03 mg/dL (ref 0.50–1.35)
Glucose, Bld: 82 mg/dL (ref 70–99)
Potassium: 3.9 mEq/L (ref 3.5–5.1)

## 2012-08-25 LAB — CBC
HCT: 34.1 % — ABNORMAL LOW (ref 39.0–52.0)
Hemoglobin: 10.6 g/dL — ABNORMAL LOW (ref 13.0–17.0)
MCV: 67.5 fL — ABNORMAL LOW (ref 78.0–100.0)
RBC: 5.05 MIL/uL (ref 4.22–5.81)
RDW: 15.6 % — ABNORMAL HIGH (ref 11.5–15.5)
WBC: 6.5 10*3/uL (ref 4.0–10.5)

## 2012-08-25 MED ORDER — DEXTROSE 50 % IV SOLN
25.0000 mL | Freq: Once | INTRAVENOUS | Status: AC | PRN
  Administered 2012-08-25: 25 mL via INTRAVENOUS

## 2012-08-25 MED ORDER — INSULIN GLARGINE 100 UNIT/ML ~~LOC~~ SOLN
5.0000 [IU] | Freq: Every day | SUBCUTANEOUS | Status: DC
  Administered 2012-08-25 – 2012-09-03 (×10): 5 [IU] via SUBCUTANEOUS

## 2012-08-25 MED ORDER — DEXTROSE 50 % IV SOLN
INTRAVENOUS | Status: AC
  Filled 2012-08-25: qty 50

## 2012-08-25 MED ORDER — KCL IN DEXTROSE-NACL 20-5-0.45 MEQ/L-%-% IV SOLN
INTRAVENOUS | Status: DC
  Administered 2012-08-25: 19:00:00 via INTRAVENOUS
  Administered 2012-08-26: 1000 mL via INTRAVENOUS
  Administered 2012-08-26: 19:00:00 via INTRAVENOUS
  Administered 2012-08-27: 1000 mL via INTRAVENOUS
  Administered 2012-08-27 – 2012-08-28 (×2): via INTRAVENOUS
  Administered 2012-08-29: 75 mL/h via INTRAVENOUS
  Administered 2012-08-29: 20 mL/h via INTRAVENOUS
  Filled 2012-08-25 (×10): qty 1000

## 2012-08-25 NOTE — Progress Notes (Signed)
Patient ID: [name], male   DOB: 1950/10/28, 62 y.o.   MRN: [id number]  [facility] PROGRESS NOTE  [name] [id number] DOB: [birth year] DOA: 08/21/2012 PCP: No primary provider on file.  Brief narrative:  [name] is a 62 year old man with past medical history of diabetes, diabetic retinopathy, dyslipidemia, and prior prostatectomy as well as ventral hernia repair who was admitted by Dr. Lilyan Punt of the surgical service for treatment of a small bowel obstruction, confirmed by CT scans. The patient states that he normally manages his diabetes with 20 units of Lantus daily as well as oral hypoglycemics. He does not regularly check his blood glucoses. Has a glucose machine but no strips. He does not currently have a primary care doctor. Used to see Dr. Carlis Abbott. Ran out of all of his medications 2 months ago except for the Lantus, which he takes sporadically.   Principal Problem:  *Small bowel obstruction  Management per primary team. Pt is status post ex lap with lysis of adhesion. Pt clinically stable.  Active Problems:  Cough  with low grade fever, unclear etiology but will place order for CXR today, rule out an infectious process  Chest pain/abnormal EKG  Chest pain now resolved. Will consider Aspirin once able to tolerate PO. Pt denies chest pain this AM. DIABETES MELLITUS, TYPE II, UNCONTROLLED  Consulted diabetes coordinator and case manager to help with aftercare.  A1C > 16  Started on moderate scale sliding scale insulin while n.p.o. Started Lantus 20 units each bedtime.  Responding well to current therapy. CBG's in low 100's HYPERLIPIDEMIA, MIXED  Patient will need a prescription for his statin therapy and case manager has been consulted to help find him a physician. HYPERTENSION  Continue clonidine patch since currently n.p.o.Will plan on transitioning to PO once pt able to tolerate  Procedures/Studies:  Ct Abdomen Pelvis W Contrast  08/21/2012  Small bowel  obstruction to the level of the distal mid small bowel with abrupt transition point immediately adjacent to the umbilicus. This could be due to adhesion or an occult intrinsic or extrinsic mass lesion. Free fluid is present without loculation to suggest abscess formation, and no free air is identified. This does confer a risk of bowel ischemia. Status post radical prostatectomy without evidence for local recurrence or intra-abdominal/pelvic metastatic disease.  Dg Abd 2 Views  08/22/2012  Similar partial small bowel obstruction, without complicating free intraperitoneal air.  Dg Abd Portable 1v  08/21/2012  NG tube coils in the stomach. Continued high-grade small bowel obstruction pattern.  Antibiotics:  None Code Status: Full  Family Communication: Pt at bedside   HPI/Subjective: No events overnight.   Objective: Filed Vitals:   08/24/12 2200 08/25/12 0000 08/25/12 0308 08/25/12 0649  BP: 120/65   130/79  Pulse: 73   75  Temp: 99.3 F (37.4 C)   98.7 F (37.1 C)  TempSrc: Oral   Oral  Resp: 18 20 17    Height:      Weight:      SpO2: 99% 99% 100%     Intake/Output Summary (Last 24 hours) at 08/25/12 1140 Last data filed at 08/25/12 0800  Gross per 24 hour  Intake 2838.46 ml  Output   1105 ml  Net 1733.46 ml    Exam:   General:  Pt is alert, follows commands appropriately, not in acute distress  Cardiovascular: Regular rate and rhythm, S1/S2, no murmurs, no rubs, no gallops  Respiratory: Clear to auscultation bilaterally, crackles at bases, no  rhonchi  Abdomen: Soft, slightly tender and distended, bowel sounds not heard, no guarding  Extremities: No edema, pulses DP and PT palpable bilaterally  Neuro: Grossly nonfocal  Data Reviewed: Basic Metabolic Panel:  Lab [id number] 0532 08/24/12 0450 08/23/12 0455 08/22/12 0545 08/21/12 0622  NA 142 139 140 137 130*  K 3.9 3.9 3.8 3.9 4.7  CL 108 106 107 103 94*  CO2 21 25 24 27 23   GLUCOSE 82 150* 88 126* 406*  BUN 12 14  12 15 14   CREATININE 1.03 1.12 1.12 1.20 1.07  CALCIUM 8.0* 7.9* 8.3* 8.7 9.8  MG -- -- -- 1.9 --  PHOS -- -- -- -- --   Liver Function Tests:  Lab 08/21/12 0406  AST 40*  ALT 20  ALKPHOS 140*  BILITOT 0.7  PROT 7.6  ALBUMIN 4.0    Lab 08/21/12 0406  LIPASE 14  AMYLASE --   No results found for this basename: AMMONIA:5 in the last 168 hours CBC:  Lab 08/25/12 0532 08/24/12 0450 08/23/12 0455 08/22/12 0545 08/21/12 0406  WBC 6.5 5.8 6.9 5.7 6.7  NEUTROABS -- -- -- -- 5.8  HGB 10.6* 11.4* 11.9* 12.7* 15.2  HCT 34.1* 37.5* 39.5 40.2 45.8  MCV 67.5* 67.7* 68.0* 66.3* 64.1*  PLT 134* 140* 130* 165 155   Cardiac Enzymes: No results found for this basename: CKTOTAL:5,CKMB:5,CKMBINDEX:5,TROPONINI:5 in the last 168 hours BNP: No components found with this basename: POCBNP:5 CBG:  Lab 08/25/12 1028 08/25/12 0804 08/25/12 0446 08/25/12 0013 08/24/12 2225  GLUCAP 98 68* 83 79 96    No results found for this or any previous visit (from the past 240 hour(s)).   Scheduled Meds:   . cloNIDine  0.2 mg Transdermal Weekly  . dextrose      . heparin  5,000 Units Subcutaneous Q8H  . insulin aspart  0-15 Units Subcutaneous Q4H  . insulin glargine  20 Units Subcutaneous QHS  . morphine   Intravenous Q4H   Continuous Infusions:   . sodium chloride 125 mL/hr at 08/25/12 [id number]     Faye Ramsay, MD  [facility] Pager [phone]  If 7PM-7AM, please contact night-coverage [email or web address] Password [email or web address] 08/25/2012, 11:40 AM   LOS: 4 days

## 2012-08-25 NOTE — Progress Notes (Signed)
2 Days Post-Op  Subjective: Comfortable.  Has walked.  No flatus or BM.  Objective: Vital signs in last 24 hours: Temp:  [98.4 F (36.9 C)-99.3 F (37.4 C)] 98.7 F (37.1 C) (01/11 0649) Pulse Rate:  [72-75] 75  (01/11 0649) Resp:  [14-20] 17  (01/11 0308) BP: (120-130)/(65-79) 130/79 mmHg (01/11 0649) SpO2:  [99 %-100 %] 100 % (01/11 0308) Last BM Date: 08/20/12  Intake/Output from previous day: 01/10 0701 - 01/11 0700 In: 2838.5 [I.V.:2738.5; IV Piggyback:100] Out: 930 [Urine:530; Emesis/NG output:400] Intake/Output this shift: Total I/O In: -  Out: 175 [Urine:175]  PE: General- In NAD Abdomen-Slight firm and distended, rare bowel sound, bilious ng output, dressing dry  Lab Results:   [facility] 08/25/12 0532 08/24/12 0450  WBC 6.5 5.8  HGB 10.6* 11.4*  HCT 34.1* 37.5*  PLT 134* 140*   BMET  Basename 08/25/12 0532 08/24/12 0450  NA 142 139  K 3.9 3.9  CL 108 106  CO2 21 25  GLUCOSE 82 150*  BUN 12 14  CREATININE 1.03 1.12  CALCIUM 8.0* 7.9*   PT/INR No results found for this basename: LABPROT:2,INR:2 in the last 72 hours Comprehensive Metabolic Panel:    Component Value Date/Time   NA 142 08/25/2012 0532   K 3.9 08/25/2012 0532   CL 108 08/25/2012 0532   CO2 21 08/25/2012 0532   BUN 12 08/25/2012 0532   CREATININE 1.03 08/25/2012 0532   GLUCOSE 82 08/25/2012 0532   CALCIUM 8.0* 08/25/2012 0532   AST 40* 08/21/2012 0406   ALT 20 08/21/2012 0406   ALKPHOS 140* 08/21/2012 0406   BILITOT 0.7 08/21/2012 0406   PROT 7.6 08/21/2012 0406   ALBUMIN 4.0 08/21/2012 0406     Studies/Results: Dg Abd Portable 1v  08/24/2012  *RADIOLOGY REPORT*  Clinical Data: NG placement  PORTABLE ABDOMEN - 1 VIEW  Comparison: 08/23/2012  Findings: NG tube tip is in the region of the antrum of the stomach.  Improvement in dilated bowel which may represent resolving ileus. Contrast remains in the right colon. Interval laparotomy.  IMPRESSION: NG tube in the gastric antrum.   Original Report  Authenticated By: Carl Best, M.D.     Anti-infectives: Anti-infectives     Start     Dose/Rate Route Frequency Ordered Stop   08/23/12 1015   ceFAZolin (ANCEF) IVPB 2 g/50 mL premix        2 g 100 mL/hr over 30 Minutes Intravenous  Once 08/23/12 1006 08/23/12 1051          Assessment Principal Problem:  *Small bowel obstruction s/p expl lap with loa on 08/23/12-no bowel activity as of yet Active Problems:  DIABETES MELLITUS, TYPE II, UNCONTROLLED  HYPERLIPIDEMIA, MIXED  HYPERTENSION  Abnormal EKG  Chest pain    LOS: 4 days   Plan: Wait for return of bowel function   Francesca Strome J 08/25/2012

## 2012-08-25 NOTE — Progress Notes (Signed)
Pt with hypoglycemic episode of 66. Hypoglycemic protocol initiated. MD notified of same. New orders given for d51/2 20k at 122ml/hr. lantus also changed to 5 units daily. Pt made aware. Vwilliams,rn.

## 2012-08-26 DIAGNOSIS — E876 Hypokalemia: Secondary | ICD-10-CM

## 2012-08-26 LAB — CBC
HCT: 33.5 % — ABNORMAL LOW (ref 39.0–52.0)
MCH: 20.7 pg — ABNORMAL LOW (ref 26.0–34.0)
MCV: 68 fL — ABNORMAL LOW (ref 78.0–100.0)
Platelets: 183 10*3/uL (ref 150–400)
RDW: 15.6 % — ABNORMAL HIGH (ref 11.5–15.5)
WBC: 6.9 10*3/uL (ref 4.0–10.5)

## 2012-08-26 LAB — BASIC METABOLIC PANEL
BUN: 8 mg/dL (ref 6–23)
CO2: 23 mEq/L (ref 19–32)
Calcium: 8.1 mg/dL — ABNORMAL LOW (ref 8.4–10.5)
Chloride: 111 mEq/L (ref 96–112)
Creatinine, Ser: 0.91 mg/dL (ref 0.50–1.35)
GFR calc Af Amer: >90 mL/min (ref >90)

## 2012-08-26 LAB — GLUCOSE, CAPILLARY
Glucose-Capillary: 131 mg/dL — ABNORMAL HIGH (ref 70–99)
Glucose-Capillary: 138 mg/dL — ABNORMAL HIGH (ref 70–99)

## 2012-08-26 MED ORDER — POTASSIUM CHLORIDE 10 MEQ/100ML IV SOLN
10.0000 meq | INTRAVENOUS | Status: AC
  Administered 2012-08-26 (×3): 10 meq via INTRAVENOUS
  Filled 2012-08-26 (×3): qty 100

## 2012-08-26 NOTE — Progress Notes (Signed)
3 Days Post-Op  Subjective: Walking a lot.  No flatus or BM.  Objective: Vital signs in last 24 hours: Temp:  [98.5 F (36.9 C)-99.3 F (37.4 C)] 99.3 F (37.4 C) (01/12 0620) Pulse Rate:  [72-76] 74  (01/12 0620) Resp:  [11-18] 15  (01/12 0811) BP: (143-145)/(80-82) 145/80 mmHg (01/12 0620) SpO2:  [99 %-100 %] 100 % (01/12 0811) Last BM Date: 08/20/12  Intake/Output from previous day: 01/11 0701 - 01/12 0700 In: 2685.8 [I.V.:2685.8] Out: 650 [Urine:650] Intake/Output this shift:    PE: General- In NAD Abdomen-Slight firm and distended, rare bowel sound, bilious ng output, incision clean and intact  Lab Results:   Basename 08/26/12 0542 08/25/12 0532  WBC 6.9 6.5  HGB 10.2* 10.6*  HCT 33.5* 34.1*  PLT 183 134*   BMET  Basename 08/26/12 0542 08/25/12 0532  NA 143 142  K 3.2* 3.9  CL 111 108  CO2 23 21  GLUCOSE 127* 82  BUN 8 12  CREATININE 0.91 1.03  CALCIUM 8.1* 8.0*   PT/INR No results found for this basename: LABPROT:2,INR:2 in the last 72 hours Comprehensive Metabolic Panel:    Component Value Date/Time   NA 143 08/26/2012 0542   K 3.2* 08/26/2012 0542   CL 111 08/26/2012 0542   CO2 23 08/26/2012 0542   BUN 8 08/26/2012 0542   CREATININE 0.91 08/26/2012 0542   GLUCOSE 127* 08/26/2012 0542   CALCIUM 8.1* 08/26/2012 0542   AST 40* 08/21/2012 0406   ALT 20 08/21/2012 0406   ALKPHOS 140* 08/21/2012 0406   BILITOT 0.7 08/21/2012 0406   PROT 7.6 08/21/2012 0406   ALBUMIN 4.0 08/21/2012 0406     Studies/Results: Dg Chest 2 View  08/25/2012  *RADIOLOGY REPORT*  Clinical Data: Cough, fever and shortness of breath.  CHEST - 2 VIEW  Comparison: Chest x-ray 07/13/2010.  Findings: Nasogastric tube with tip in the antral prepyloric region of the stomach.  Lung volumes are normal.  Small bilateral pleural effusions.  No consolidative airspace disease.  Pulmonary vasculature is normal.  Heart size is upper limits of normal. Mediastinal contours are unremarkable.  IMPRESSION: 1.   Small bilateral pleural effusions. 2.  Tip of nasogastric tube is in the antral prepyloric region of the stomach.   Original Report Authenticated By: Vinnie Langton, M.D.    Dg Abd Portable 1v  08/24/2012  *RADIOLOGY REPORT*  Clinical Data: NG placement  PORTABLE ABDOMEN - 1 VIEW  Comparison: 08/23/2012  Findings: NG tube tip is in the region of the antrum of the stomach.  Improvement in dilated bowel which may represent resolving ileus. Contrast remains in the right colon. Interval laparotomy.  IMPRESSION: NG tube in the gastric antrum.   Original Report Authenticated By: Carl Best, M.D.     Anti-infectives: Anti-infectives     Start     Dose/Rate Route Frequency Ordered Stop   08/23/12 1015   ceFAZolin (ANCEF) IVPB 2 g/50 mL premix        2 g 100 mL/hr over 30 Minutes Intravenous  Once 08/23/12 1006 08/23/12 1051          Assessment Principal Problem:  *Small bowel obstruction s/p expl lap with loa on 08/23/12-post op ileus as expected Active Problems:  DIABETES MELLITUS, TYPE II, UNCONTROLLED  HYPERLIPIDEMIA, MIXED  HYPERTENSION  Abnormal EKG  Chest pain  Hypokalemia    LOS: 5 days   Plan: Correct hypokalemia.  Continue ng until bowel function starts to return.   Laiya Wisby J 08/26/2012

## 2012-08-26 NOTE — Plan of Care (Signed)
Problem: Phase I Progression Outcomes Goal: Pain controlled with appropriate interventions Outcome: Progressing PCA morphine is minimally working for patient pain - encourage patient to use more often if in pain. Patient verbalize understanding.  Goal: OOB as tolerated unless otherwise ordered Outcome: Progressing Ambulating with stand by assist. oob to chair. Tolerating

## 2012-08-26 NOTE — Progress Notes (Signed)
Patient ID: [name], male   DOB: 10-28-1950, 62 y.o.   MRN: [id number]  [facility] PROGRESS NOTE  [name] [id number] DOB: 1951/06/24 DOA: 08/21/2012 PCP: No primary provider on file.  Brief narrative:  [name] is a 62 year old man with past medical history of diabetes, diabetic retinopathy, dyslipidemia, and prior prostatectomy as well as ventral hernia repair who was admitted by [name] of the surgical service for treatment of a small bowel obstruction, confirmed by CT scans. The patient states that he normally manages his diabetes with 20 units of Lantus daily as well as oral hypoglycemics. He does not regularly check his blood glucoses. Has a glucose machine but no strips. He does not currently have a primary care doctor. Used to see [name]. Ran out of all of his medications 2 months ago except for the Lantus, which he takes sporadically.   Principal Problem:  *Small bowel obstruction  Management per primary team. Pt is status post ex lap with lysis of adhesion. Pt clinically stable.  Active Problems:  Cough   With low grade fever, unclear etiology but now improving, CXR negative for PNA  Maintaining oxygen saturations at target range  Chest pain/abnormal EKG  Chest pain now resolved. Will consider Aspirin once able to tolerate PO. Pt denies chest pain this AM. DIABETES MELLITUS, TYPE II, UNCONTROLLED  Consulted diabetes coordinator and case manager to help with aftercare.  A1C > 16  Started on moderate scale sliding scale insulin while n.p.o but pt with hypoglycemic episode Agree with lowering dose of Lantus, dextrose as needed  Responding well to current therapy. CBG's in low 100's HYPERLIPIDEMIA, MIXED  Patient will need a prescription for his statin therapy and case manager has been consulted to help find him a physician. HYPERTENSION  Continue clonidine patch since currently n.p.o.Will plan on transitioning to PO once pt able to tolerated    Procedures/Studies:  Ct Abdomen Pelvis W Contrast  08/21/2012  Small bowel obstruction to the level of the distal mid small bowel with abrupt transition point immediately adjacent to the umbilicus. This could be due to adhesion or an occult intrinsic or extrinsic mass lesion. Free fluid is present without loculation to suggest abscess formation, and no free air is identified. This does confer a risk of bowel ischemia. Status post radical prostatectomy without evidence for local recurrence or intra-abdominal/pelvic metastatic disease.  Dg Abd 2 Views  08/22/2012  Similar partial small bowel obstruction, without complicating free intraperitoneal air.  Dg Abd Portable 1v  08/21/2012  NG tube coils in the stomach. Continued high-grade small bowel obstruction pattern.  Antibiotics:  None  Code Status: Full  Family Communication: Pt at bedside   HPI/Subjective: No events overnight.   Objective: Filed Vitals:   08/26/12 0811 08/26/12 1250 08/26/12 1404 08/26/12 1707  BP:   152/78   Pulse:   71   Temp:   99 F (37.2 C)   TempSrc:   Oral   Resp: 15 13 15 17   Height:      Weight:      SpO2: 100% 100% 100% 98%    Intake/Output Summary (Last 24 hours) at 08/26/12 1728 Last data filed at 08/26/12 1400  Gross per 24 hour  Intake 3454.42 ml  Output    925 ml  Net 2529.42 ml    Exam:   General:  Pt is alert, follows commands appropriately, not in acute distress  Cardiovascular: Regular rate and rhythm, S1/S2, no murmurs, no rubs, no  gallops  Respiratory: Clear to auscultation bilaterally, no wheezing, no crackles, no rhonchi  Abdomen: Soft, non tender, distended, no guarding  Extremities: No edema, pulses DP and PT palpable bilaterally  Neuro: Grossly nonfocal  Data Reviewed: Basic Metabolic Panel:  Lab [id number] 08/25/12 0532 08/24/12 0450 08/23/12 0455 08/22/12 0545  NA 143 142 139 140 137  K 3.2* 3.9 3.9 3.8 3.9  CL 111 108 106 107 103  CO2 23 21 25 24 27   GLUCOSE  127* 82 150* 88 126*  BUN 8 12 14 12 15   CREATININE 0.91 1.03 1.12 1.12 1.20  CALCIUM 8.1* 8.0* 7.9* 8.3* 8.7  MG -- -- -- -- 1.9  PHOS -- -- -- -- --   Liver Function Tests:  Lab 08/21/12 0406  AST 40*  ALT 20  ALKPHOS 140*  BILITOT 0.7  PROT 7.6  ALBUMIN 4.0    Lab 08/21/12 0406  LIPASE 14  AMYLASE --   No results found for this basename: AMMONIA:5 in the last 168 hours CBC:  Lab 08/26/12 0542 08/25/12 0532 08/24/12 0450 08/23/12 0455 08/22/12 0545 08/21/12 0406  WBC 6.9 6.5 5.8 6.9 5.7 --  NEUTROABS -- -- -- -- -- 5.8  HGB 10.2* 10.6* 11.4* 11.9* 12.7* --  HCT 33.5* 34.1* 37.5* 39.5 40.2 --  MCV 68.0* 67.5* 67.7* 68.0* 66.3* --  PLT 183 134* 140* 130* 165 --   CBG:  Lab 08/26/12 1655 08/26/12 1149 08/26/12 0738 08/26/12 0413 08/26/12 0059  GLUCAP 100* 138* 131* 127* 144*    No results found for this or any previous visit (from the past 240 hour(s)).   Scheduled Meds:   . cloNIDine  0.2 mg Transdermal Weekly  . heparin  5,000 Units Subcutaneous Q8H  . insulin aspart  0-15 Units Subcutaneous Q4H  . insulin glargine  5 Units Subcutaneous QHS  . morphine   Intravenous Q4H  . potassium chloride  10 mEq Intravenous Q1 Hr x 3   Continuous Infusions:   . dextrose 5 % and 0.45 % NaCl with KCl 20 mEq/L 1,000 mL (08/26/12 0401)     [name] Ramsay, MD  [facility] Pager [phone]  If 7PM-7AM, please contact night-coverage [email or web address] Password [facility] 08/26/2012, 5:28 PM   LOS: 5 days

## 2012-08-27 ENCOUNTER — Inpatient Hospital Stay (HOSPITAL_COMMUNITY): Payer: Medicare Other

## 2012-08-27 LAB — CBC
MCH: 20.6 pg — ABNORMAL LOW (ref 26.0–34.0)
MCHC: 30.3 g/dL (ref 30.0–36.0)
MCV: 67.8 fL — ABNORMAL LOW (ref 78.0–100.0)
Platelets: 190 10*3/uL (ref 150–400)
RDW: 15.7 % — ABNORMAL HIGH (ref 11.5–15.5)
WBC: 6.5 10*3/uL (ref 4.0–10.5)

## 2012-08-27 LAB — GLUCOSE, CAPILLARY
Glucose-Capillary: 132 mg/dL — ABNORMAL HIGH (ref 70–99)
Glucose-Capillary: 152 mg/dL — ABNORMAL HIGH (ref 70–99)
Glucose-Capillary: 156 mg/dL — ABNORMAL HIGH (ref 70–99)
Glucose-Capillary: 180 mg/dL — ABNORMAL HIGH (ref 70–99)

## 2012-08-27 LAB — BASIC METABOLIC PANEL
BUN: 7 mg/dL (ref 6–23)
Calcium: 8.3 mg/dL — ABNORMAL LOW (ref 8.4–10.5)
GFR calc Af Amer: >90 mL/min (ref >90)
GFR calc non Af Amer: 89 mL/min — ABNORMAL LOW (ref >90)
Potassium: 3.8 mEq/L (ref 3.5–5.1)

## 2012-08-27 MED ORDER — MORPHINE SULFATE (PF) 1 MG/ML IV SOLN: INTRAVENOUS | Status: DC

## 2012-08-27 MED ORDER — DIPHENHYDRAMINE HCL 50 MG/ML IJ SOLN: 12.5000 mg | Freq: Four times a day (QID) | INTRAMUSCULAR | Status: DC | PRN

## 2012-08-27 MED ORDER — ONDANSETRON HCL 4 MG/2ML IJ SOLN: 4.0000 mg | Freq: Four times a day (QID) | INTRAMUSCULAR | Status: DC | PRN

## 2012-08-27 MED ORDER — DIPHENHYDRAMINE HCL 12.5 MG/5ML PO ELIX: 12.5000 mg | ORAL_SOLUTION | Freq: Four times a day (QID) | ORAL | Status: DC | PRN

## 2012-08-27 MED ORDER — PROMETHAZINE HCL 25 MG/ML IJ SOLN
12.5000 mg | Freq: Four times a day (QID) | INTRAMUSCULAR | Status: DC | PRN
  Administered 2012-08-29: 25 mg via INTRAVENOUS
  Filled 2012-08-27: qty 1

## 2012-08-27 MED ORDER — MAGIC MOUTHWASH
15.0000 mL | Freq: Four times a day (QID) | ORAL | Status: DC | PRN
  Administered 2012-08-28 – 2012-09-02 (×2): 15 mL via ORAL
  Filled 2012-08-27 (×4): qty 15

## 2012-08-27 MED ORDER — LACTATED RINGERS IV BOLUS (SEPSIS): 1000.0000 mL | Freq: Three times a day (TID) | INTRAVENOUS | Status: AC | PRN

## 2012-08-27 MED ORDER — ACETAMINOPHEN 10 MG/ML IV SOLN
1000.0000 mg | Freq: Four times a day (QID) | INTRAVENOUS | Status: AC
  Administered 2012-08-27 – 2012-08-28 (×4): 1000 mg via INTRAVENOUS
  Filled 2012-08-27 (×4): qty 100

## 2012-08-27 MED ORDER — BISACODYL 10 MG RE SUPP
10.0000 mg | Freq: Every day | RECTAL | Status: DC
  Administered 2012-08-27 – 2012-09-03 (×4): 10 mg via RECTAL
  Filled 2012-08-27 (×6): qty 1

## 2012-08-27 MED ORDER — NALOXONE HCL 0.4 MG/ML IJ SOLN: 0.4000 mg | INTRAMUSCULAR | Status: DC | PRN

## 2012-08-27 MED ORDER — LIP MEDEX EX OINT
1.0000 "application " | TOPICAL_OINTMENT | Freq: Two times a day (BID) | CUTANEOUS | Status: DC
  Administered 2012-08-27 – 2012-09-04 (×17): 1 via TOPICAL
  Filled 2012-08-27 (×2): qty 7

## 2012-08-27 MED ORDER — SODIUM CHLORIDE 0.9 % IJ SOLN: 9.0000 mL | INTRAMUSCULAR | Status: DC | PRN

## 2012-08-27 MED ORDER — PHENOL 1.4 % MT LIQD: 2.0000 | OROMUCOSAL | Status: DC | PRN

## 2012-08-27 MED ORDER — MENTHOL 3 MG MT LOZG
1.0000 | LOZENGE | OROMUCOSAL | Status: DC | PRN
  Filled 2012-08-27: qty 9

## 2012-08-27 MED ORDER — ALUM & MAG HYDROXIDE-SIMETH 200-200-20 MG/5ML PO SUSP: 30.0000 mL | Freq: Four times a day (QID) | ORAL | Status: DC | PRN

## 2012-08-27 NOTE — Progress Notes (Signed)
Inpatient Diabetes Program Recommendations  AACE/ADA: New Consensus Statement on Inpatient Glycemic Control (2013)  Target Ranges:  Prepandial:   less than 140 mg/dL      Peak postprandial:   less than 180 mg/dL (1-2 hours)      Critically ill patients:  140 - 180 mg/dL   Reason for Visit: Diabetes Management  Pt still NPO with NG.  Blood sugars look good.  Lantus has been decreased to 5 units QHS and Novolog mod Q4 hours.  HgbA1C results - 16.3% Encourage pt to view diabetes videos on pt ed channel. Will need PCP to manage DM and pt and family to be instructed on glucose monitoring and insulin pen administration.  Discussed with Dr. Doyle Askew.  Results for RODELL, GANTZ (MRN [id number]) as of 08/27/2012 16:43  Ref. Range 08/26/2012 16:55 08/26/2012 20:10 08/27/2012 00:40 08/27/2012 04:40 08/27/2012 07:36 08/27/2012 12:09  Glucose-Capillary Latest Range: 70-99 mg/dL 100 (H) 102 (H) 132 (H) 144 (H) 180 (H) 156 (H)  Results for ASHTYN, FRASIER (MRN [id number]) as of 08/27/2012 16:43  Ref. Range 08/22/2012 05:45  Hemoglobin A1C Latest Range: <5.7 % 16.3 (H)   Continue with current basal/bolus insulin therapy.  Will continue to monitor.  Will follow daily.  Thank you. Lorenda Peck, RD, LDN, CDE Inpatient Diabetes Coordinator [phone]

## 2012-08-27 NOTE — Progress Notes (Addendum)
4 Days Post-Op  Subjective: He is very sleepy this Am and feels no better.   Objective: Vital signs in last 24 hours: Temp:  [98 F (36.7 C)-99 F (37.2 C)] 98 F (36.7 C) (01/12 2312) Pulse Rate:  [71-99] 99  (01/12 2312) Resp:  [9-17] 14  (01/13 0800) BP: (129-152)/(73-78) 129/73 mmHg (01/12 2312) SpO2:  [96 %-100 %] 96 % (01/13 0800) Last BM Date: 08/20/12  350 ml from NG, NPO, afebrile, VSS, labs OK, K+ 3.8  Intake/Output from previous day: 01/12 0701 - 01/13 0700 In: 2029 [I.V.:2029] Out: 1300 [Urine:950; Emesis/NG output:350] Intake/Output this shift:    General appearance: cooperative, no distress and sleepy GI: distended, no flatus and no bowel sounds.  Lab Results:   [facility] 08/27/12 0450 08/26/12 0542  WBC 6.5 6.9  HGB 10.8* 10.2*  HCT 35.6* 33.5*  PLT 190 183    BMET  Basename 08/27/12 0450 08/26/12 0542  NA 142 143  K 3.8 3.2*  CL 110 111  CO2 24 23  GLUCOSE 172* 127*  BUN 7 8  CREATININE 0.90 0.91  CALCIUM 8.3* 8.1*   PT/INR No results found for this basename: LABPROT:2,INR:2 in the last 72 hours   Lab 08/21/12 0406  AST 40*  ALT 20  ALKPHOS 140*  BILITOT 0.7  PROT 7.6  ALBUMIN 4.0     Lipase     Component Value Date/Time   LIPASE 14 08/21/2012 0406     Studies/Results: Dg Chest 2 View  08/25/2012  *RADIOLOGY REPORT*  Clinical Data: Cough, fever and shortness of breath.  CHEST - 2 VIEW  Comparison: Chest x-ray 07/13/2010.  Findings: Nasogastric tube with tip in the antral prepyloric region of the stomach.  Lung volumes are normal.  Small bilateral pleural effusions.  No consolidative airspace disease.  Pulmonary vasculature is normal.  Heart size is upper limits of normal. Mediastinal contours are unremarkable.  IMPRESSION: 1.  Small bilateral pleural effusions. 2.  Tip of nasogastric tube is in the antral prepyloric region of the stomach.   Original Report Authenticated By: Vinnie Langton, M.D.     Medications:    . cloNIDine   0.2 mg Transdermal Weekly  . heparin  5,000 Units Subcutaneous Q8H  . insulin aspart  0-15 Units Subcutaneous Q4H  . insulin glargine  5 Units Subcutaneous QHS  . morphine   Intravenous Q4H      . dextrose 5 % and 0.45 % NaCl with KCl 20 mEq/L 1,000 mL (08/27/12 0450)    Assessment/Plan Bowel obstruction. S/p Exploratory laparotomy with lysis of adhesions. [name] Hook, [name], 08/24/2012  1. Small bowel obstruction with history of prior ventral hernia repair and prostatectomy.  2. Adult diabetes type 2 HBA1C 16.3 on admit. 3.Diabetic Retinopathy/neuropathy  4.dyslipidemia  5.Visual impairment.  6. Hx of prostate CA  7. Abnormal EKG with T wave changes lateral leads, 1 degree AV block, repeated by Dr. Rockne Menghini and recommendation of ASA at D/C   Plan;  Cut back on his PCA, mobilize more, recheck films today.       LOS: 6 days    [name],[name] 08/27/2012  NGT until flatus Ambulate more F/u Xrays DM control  ADDENDUM:  XRays should contrast in colon, No SBO. - try enema Low NGT out put - try clamping

## 2012-08-27 NOTE — Progress Notes (Addendum)
Patient ID: [name], male   DOB: 01-09-1951, 62 y.o.   MRN: [id number]  [facility] PROGRESS NOTE  [name] [id number] DOB: 02-19-1951 DOA: 08/21/2012 PCP: No primary provider on file.  Brief narrative:  [name] is a 62 year old man with past medical history of diabetes, diabetic retinopathy, dyslipidemia, and prior prostatectomy as well as ventral hernia repair who was admitted by Dr. Lilyan Punt of the surgical service for treatment of a small bowel obstruction, confirmed by CT scans. The patient states that he normally manages his diabetes with 20 units of Lantus daily as well as oral hypoglycemics. He does not regularly check his blood glucoses. Has a glucose machine but no strips. He does not currently have a primary care doctor. Used to see Dr. Carlis Abbott. Ran out of all of his medications 2 months ago except for the Lantus, which he takes sporadically.   Principal Problem:  *Small bowel obstruction  Management per primary team. Pt is status post ex lap with lysis of adhesion. Pt clinically stable. Keep NPO. Active Problems:  Cough   Afebrile over 24 hours   Maintaining oxygen saturations at target range   No PNA on CXR Chest pain/abnormal EKG  Chest pain now resolved. Will consider Aspirin once able to tolerate PO. Pt denies chest pain this AM. DIABETES MELLITUS, TYPE II, UNCONTROLLED  Consulted diabetes coordinator and case manager to help with aftercare.  A1C > 16  Started on moderate scale sliding scale insulin while n.p.o but pt with hypoglycemic episode  Agree with lowering dose of Lantus, dextrose as needed  Responding well to current therapy. CBG's in low 100's HYPERLIPIDEMIA, MIXED  Patient will need a prescription for his statin therapy and case manager has been consulted to help find him a physician. HYPERTENSION  Continue clonidine patch since currently n.p.o.Will plan on transitioning to PO once pt able to tolerated   Procedures/Studies:  Dg Chest  2 View 08/25/2012    1.  Small bilateral pleural effusions.  2.  Tip of nasogastric tube is in the antral prepyloric region of the stomach.    Dg Abd Acute W/chest 08/27/2012    1.  Small bilateral pleural effusions and bibasilar atelectasis.  2.  Persistent contrast in the colon.  3.  No findings for small bowel obstruction or free air.   Ct Abdomen Pelvis W Contrast  08/21/2012  Small bowel obstruction to the level of the distal mid small bowel with abrupt transition point immediately adjacent to the umbilicus. This could be due to adhesion or an occult intrinsic or extrinsic mass lesion. Free fluid is present without loculation to suggest abscess formation, and no free air is identified. This does confer a risk of bowel ischemia. Status post radical prostatectomy without evidence for local recurrence or intra-abdominal/pelvic metastatic disease.  Dg Abd 2 Views  08/22/2012  Similar partial small bowel obstruction, without complicating free intraperitoneal air.  Dg Abd Portable 1v  08/21/2012  NG tube coils in the stomach. Continued high-grade small bowel obstruction pattern.  Antibiotics:  None Code Status: Full  Family Communication: Pt at bedside   HPI/Subjective: No events overnight.   Objective: Filed Vitals:   08/27/12 0800 08/27/12 1010 08/27/12 1022 08/27/12 1434  BP:    144/84  Pulse:    64  Temp:    98.7 F (37.1 C)  TempSrc:    Oral  Resp: 14 16 16 16   Height:      Weight:      SpO2: 96%  99% 98% 98%    Intake/Output Summary (Last 24 hours) at 08/27/12 1558 Last data filed at 08/27/12 1059  Gross per 24 hour  Intake      0 ml  Output    875 ml  Net   -875 ml    Exam:   General:  Pt is alert, follows commands appropriately, not in acute distress  Cardiovascular: Regular rate and rhythm, S1/S2, no murmurs, no rubs, no gallops  Respiratory: Clear to auscultation bilaterally, no wheezing, no crackles, no rhonchi  Abdomen: Soft, non tender, distended, no  guarding  Extremities: No edema, pulses DP and PT palpable bilaterally  Neuro: Grossly nonfocal  Data Reviewed: Basic Metabolic Panel:  Lab [id number] 0450 08/26/12 0542 08/25/12 0532 08/24/12 0450 08/23/12 0455 08/22/12 0545  NA 142 143 142 139 140 --  K 3.8 3.2* 3.9 3.9 3.8 --  CL 110 111 108 106 107 --  CO2 24 23 21 25 24  --  GLUCOSE 172* 127* 82 150* 88 --  BUN 7 8 12 14 12  --  CREATININE 0.90 0.91 1.03 1.12 1.12 --  CALCIUM 8.3* 8.1* 8.0* 7.9* 8.3* --  MG -- -- -- -- -- 1.9  PHOS -- -- -- -- -- --   Liver Function Tests:  Lab 08/21/12 0406  AST 40*  ALT 20  ALKPHOS 140*  BILITOT 0.7  PROT 7.6  ALBUMIN 4.0    Lab 08/21/12 0406  LIPASE 14  AMYLASE --   CBC:  Lab 08/27/12 0450 08/26/12 0542 08/25/12 0532 08/24/12 0450 08/23/12 0455 08/21/12 0406  WBC 6.5 6.9 6.5 5.8 6.9 --  NEUTROABS -- -- -- -- -- 5.8  HGB 10.8* 10.2* 10.6* 11.4* 11.9* --  HCT 35.6* 33.5* 34.1* 37.5* 39.5 --  MCV 67.8* 68.0* 67.5* 67.7* 68.0* --  PLT 190 183 134* 140* 130* --   CBG:  Lab 08/27/12 1209 08/27/12 0736 08/27/12 0440 08/27/12 0040 08/26/12 2010  GLUCAP 156* 180* 144* 132* 102*   Scheduled Meds:   . acetaminophen  1,000 mg Intravenous Q6H  . bisacodyl  10 mg Rectal Daily  . cloNIDine  0.2 mg Transdermal Weekly  . heparin  5,000 Units Subcutaneous Q8H  . insulin aspart  0-15 Units Subcutaneous Q4H  . insulin glargine  5 Units Subcutaneous QHS  . lip balm  1 application Topical BID   Continuous Infusions:   . dextrose 5 % and 0.45 % NaCl with KCl 20 mEq/L 1,000 mL (08/27/12 0450)     Faye Ramsay, MD  [facility] Pager [phone]  If 7PM-7AM, please contact night-coverage [email or web address] Password [email or web address] 08/27/2012, 3:58 PM   LOS: 6 days

## 2012-08-28 LAB — GLUCOSE, CAPILLARY
Glucose-Capillary: 208 mg/dL — ABNORMAL HIGH (ref 70–99)
Glucose-Capillary: 151 mg/dL — ABNORMAL HIGH (ref 70–99)
Glucose-Capillary: 195 mg/dL — ABNORMAL HIGH (ref 70–99)

## 2012-08-28 LAB — BASIC METABOLIC PANEL
BUN: 5 mg/dL — ABNORMAL LOW (ref 6–23)
GFR calc Af Amer: >90 mL/min (ref >90)
GFR calc non Af Amer: >90 mL/min (ref >90)
Potassium: 3.3 mEq/L — ABNORMAL LOW (ref 3.5–5.1)

## 2012-08-28 LAB — CBC
HCT: 33.7 % — ABNORMAL LOW (ref 39.0–52.0)
MCHC: 31.5 g/dL (ref 30.0–36.0)
RDW: 15.4 % (ref 11.5–15.5)

## 2012-08-28 MED ORDER — GUAIFENESIN-DM 100-10 MG/5ML PO SYRP
10.0000 mL | ORAL_SOLUTION | ORAL | Status: DC | PRN
  Administered 2012-08-28 – 2012-08-29 (×2): 10 mL via ORAL
  Filled 2012-08-28 (×2): qty 10

## 2012-08-28 MED ORDER — ACETAMINOPHEN 10 MG/ML IV SOLN
1000.0000 mg | Freq: Four times a day (QID) | INTRAVENOUS | Status: AC
  Administered 2012-08-28 – 2012-08-29 (×4): 1000 mg via INTRAVENOUS
  Filled 2012-08-28 (×4): qty 100

## 2012-08-28 MED ORDER — POTASSIUM CHLORIDE 10 MEQ/100ML IV SOLN
10.0000 meq | INTRAVENOUS | Status: AC
  Administered 2012-08-28 (×5): 10 meq via INTRAVENOUS
  Filled 2012-08-28 (×4): qty 100

## 2012-08-28 NOTE — Progress Notes (Signed)
Having flatus & BM today Feels better Abd distended but a little more soft Agree w NGT clamp trial Get up moving more - RNs & staff aware   Adin Hector, M.D., F.A.C.S. Gastrointestinal and Minimally Invasive Surgery [facility] [address] [place], [place] [address] [phone] Main / Paging [phone] Voice Mail

## 2012-08-28 NOTE — Progress Notes (Signed)
5 Days Post-Op  Subjective: Walked twice yesterday, had an enema and a suppository, with some "yellow fluid," coming out.  Said he felt to bad to do IS. Says techs are to busy to walk him.  Objective: Vital signs in last 24 hours: Temp:  [98.4 F (36.9 C)-98.7 F (37.1 C)] 98.6 F (37 C) (01/14 0529) Pulse Rate:  [61-64] 62  (01/14 0529) Resp:  [16-18] 18  (01/14 0529) BP: (134-147)/(72-84) 147/72 mmHg (01/14 0529) SpO2:  [95 %-99 %] 95 % (01/14 0529) Last BM Date: 08/20/12  Nothing recorded from NG, No diet, afebrile, VSS, K+ 3.3, film yesterday shows bilat effusions no obstruction, contrast in colon  Intake/Output from previous day: 01/13 0701 - 01/14 0700 In: 3050 [P.O.:120; I.V.:2900; NG/GT:30] Out: 250 [Urine:250] Intake/Output this shift: Total I/O In: 1226.7 [I.V.:1226.7] Out: -   General appearance: alert, cooperative, no distress and less discomfort than yesterday. Resp: clear to auscultation bilaterally GI: soft, still distended, he's up in chair, + BS, he says he doesn't think he's passing gas.  No nausea with NG clamped.  Lab Results:   Basename 08/28/12 0535 08/27/12 0450  WBC 5.3 6.5  HGB 10.6* 10.8*  HCT 33.7* 35.6*  PLT 216 190    BMET  Basename 08/28/12 0535 08/27/12 0450  NA 139 142  K 3.3* 3.8  CL 107 110  CO2 24 24  GLUCOSE 203* 172*  BUN 5* 7  CREATININE 0.82 0.90  CALCIUM 8.2* 8.3*   PT/INR No results found for this basename: LABPROT:2,INR:2 in the last 72 hours  No results found for this basename: AST:5,ALT:5,ALKPHOS:5,BILITOT:5,PROT:5,ALBUMIN:5 in the last 168 hours   Lipase     Component Value Date/Time   LIPASE 14 08/21/2012 0406     Studies/Results: Dg Abd Acute W/chest  08/27/2012  *RADIOLOGY REPORT*  Clinical Data: Postop hernia repair.  Ileus.  ACUTE ABDOMEN SERIES (ABDOMEN 2 VIEW & CHEST 1 VIEW)  Comparison: Chest x-ray 08/25/2012 and abdominal film 08/24/2012.  Findings: The upright chest x-ray demonstrates low lung  volumes with vascular crowding and streaky atelectasis.  There are persistent small bilateral pleural effusions.  Two views of the abdomen demonstrate the NG tube tip in the antropyloric region of the stomach.  There is persistent contrast in the colon.  The appendix is visualized.  No distended small bowel loops to suggest obstruction.  No free air.  The soft tissue shadows are maintained.  Surgical skin staples are noted.  IMPRESSION:  1.  Small bilateral pleural effusions and bibasilar atelectasis. 2.  Persistent contrast in the colon. 3.  No findings for small bowel obstruction or free air.   Original Report Authenticated By: Marijo Sanes, M.D.     Medications:    . bisacodyl  10 mg Rectal Daily  . cloNIDine  0.2 mg Transdermal Weekly  . heparin  5,000 Units Subcutaneous Q8H  . insulin aspart  0-15 Units Subcutaneous Q4H  . insulin glargine  5 Units Subcutaneous QHS  . lip balm  1 application Topical BID      . dextrose 5 % and 0.45 % NaCl with KCl 20 mEq/L 100 mL/hr at 08/27/12 1605    Assessment/Plan Bowel obstruction. S/p Exploratory laparotomy with lysis of adhesions. [name], DO, 08/24/2012  1. Small bowel obstruction with history of prior ventral hernia repair and prostatectomy.  2. Adult diabetes type 2 HBA1C 16.3 on admit.  3.Diabetic Retinopathy/neuropathy  4.dyslipidemia  5.Visual impairment.  6. Hx of prostate CA  7. Abnormal EKG with  T wave changes lateral leads, 1 degree AV block, repeated by Dr. Rockne Menghini and recommendation of ASA at D/C   Start some clears with NG in and see how he does. Talk to nursing about walking him again. Nursing staff says he walked quite a bit yesterday and was smiling so continue to mobilize and see how he does.   LOS: 7 days    Rockwell Zentz 08/28/2012

## 2012-08-28 NOTE — Progress Notes (Signed)
Patient ID: [name], male   DOB: 1951-01-01, 62 y.o.   MRN: [id number]  [facility] PROGRESS NOTE  [name] [id number] DOB: 1951/08/12 DOA: 08/21/2012 PCP: No primary provider on file.  Brief narrative:  [name] is a 62 year old man with past medical history of diabetes, diabetic retinopathy, dyslipidemia, and prior prostatectomy as well as ventral hernia repair who was admitted by [name] of the surgical service for treatment of a small bowel obstruction, confirmed by CT scans. The patient states that he normally manages his diabetes with 20 units of Lantus daily as well as oral hypoglycemics. He does not regularly check his blood glucoses. Has a glucose machine but no strips. He does not currently have a primary care doctor. Used to see Dr. Carlis [name]. Ran out of all of his medications 2 months ago except for the Lantus, which he takes sporadically.   Principal Problem:  *Small bowel obstruction  Management per primary team. Pt is status post ex lap with lysis of adhesion. Pt clinically stable.  Attempt clear liquids today as per surgery recommendations  Active Problems:  Cough   Afebrile over 24 hours  Maintaining oxygen saturations at target range   No PNA on CXR Chest pain/abnormal EKG  Chest pain now resolved. Will consider Aspirin once able to tolerate PO. Pt denies chest pain this AM.  DIABETES MELLITUS, TYPE II, UNCONTROLLED  Consulted diabetes coordinator and case manager to help with aftercare.  A1C > 16  Started on moderate scale sliding scale insulin while n.p.o but pt with hypoglycemic episodes  Sugar levels controlled on current dose of Lantus 5 units each bedtime along with sliding scale insulin HYPERLIPIDEMIA, MIXED  Patient will need a prescription for his statin therapy and case manager has been consulted to help find him a physician. HYPERTENSION  Continue clonidine patch since currently n.p.o.Will plan on transitioning to PO once pt able to  tolerated  Reasonable inpatient control on current regimen  Procedures/Studies:  Dg Chest 2 View  08/25/2012  1. Small bilateral pleural effusions.  2. Tip of nasogastric tube is in the antral prepyloric region of the stomach.  Dg Abd Acute W/chest  08/27/2012  1. Small bilateral pleural effusions and bibasilar atelectasis.  2. Persistent contrast in the colon.  3. No findings for small bowel obstruction or free air.  Ct Abdomen Pelvis W Contrast  08/21/2012  Small bowel obstruction to the level of the distal mid small bowel with abrupt transition point immediately adjacent to the umbilicus. This could be due to adhesion or an occult intrinsic or extrinsic mass lesion. Free fluid is present without loculation to suggest abscess formation, and no free air is identified. This does confer a risk of bowel ischemia. Status post radical prostatectomy without evidence for local recurrence or intra-abdominal/pelvic metastatic disease.  Dg Abd 2 Views  08/22/2012  Similar partial small bowel obstruction, without complicating free intraperitoneal air.  Dg Abd Portable 1v  08/21/2012  NG tube coils in the stomach. Continued high-grade small bowel obstruction pattern.  Antibiotics:  None  Code Status: Full  Family Communication: Pt at bedside   HPI/Subjective: No events overnight.   Objective: Filed Vitals:   08/27/12 2156 08/28/12 0529 08/28/12 1332 08/28/12 1500  BP: 134/80 147/72 172/74 160/80  Pulse: 61 62 66 60  Temp: 98.4 F (36.9 C) 98.6 F (37 C) 99.3 F (37.4 C) 98.2 F (36.8 C)  TempSrc: Oral Oral Oral Oral  Resp: 18 18 18 20   Height:  Weight:      SpO2: 96% 95% 95% 95%    Intake/Output Summary (Last 24 hours) at 08/28/12 1619 Last data filed at 08/28/12 1032  Gross per 24 hour  Intake 4276.67 ml  Output    300 ml  Net 3976.67 ml    Exam:   General:  Pt is alert, follows commands appropriately, not in acute distress  Cardiovascular: Regular rate and rhythm,  S1/S2, no murmurs, no rubs, no gallops  Respiratory: Clear to auscultation bilaterally, no wheezing, no crackles, no rhonchi  Abdomen: Soft, non tender, distended, no guarding  Extremities: No edema, pulses DP and PT palpable bilaterally  Neuro: Grossly nonfocal  Data Reviewed: Basic Metabolic Panel:  Lab [id number] 08/27/12 0450 08/26/12 0542 08/25/12 0532 08/24/12 0450 08/22/12 0545  NA 139 142 143 142 139 --  K 3.3* 3.8 3.2* 3.9 3.9 --  CL 107 110 111 108 106 --  CO2 24 24 23 21 25  --  GLUCOSE 203* 172* 127* 82 150* --  BUN 5* 7 8 12 14  --  CREATININE 0.82 0.90 0.91 1.03 1.12 --  CALCIUM 8.2* 8.3* 8.1* 8.0* 7.9* --  MG -- -- -- -- -- 1.9  PHOS -- -- -- -- -- --   CBC:  Lab 08/28/12 0535 08/27/12 0450 08/26/12 0542 08/25/12 0532 08/24/12 0450  WBC 5.3 6.5 6.9 6.5 5.8  NEUTROABS -- -- -- -- --  HGB 10.6* 10.8* 10.2* 10.6* 11.4*  HCT 33.7* 35.6* 33.5* 34.1* 37.5*  MCV 66.5* 67.8* 68.0* 67.5* 67.7*  PLT 216 190 183 134* 140*   CBG:  Lab 08/28/12 1217 08/28/12 0725 08/28/12 0409 08/28/12 0013 08/27/12 2037  GLUCAP 152* 194* 208* 150* 200*   Scheduled Meds:   . acetaminophen  1,000 mg Intravenous Q6H  . bisacodyl  10 mg Rectal Daily  . cloNIDine  0.2 mg Transdermal Weekly  . heparin  5,000 Units Subcutaneous Q8H  . insulin aspart  0-15 Units Subcutaneous Q4H  . insulin glargine  5 Units Subcutaneous QHS  . lip balm  1 application Topical BID   Continuous Infusions:   . dextrose 5 % and 0.45 % NaCl with KCl 20 mEq/L 100 mL/hr at 08/28/12 1238     [name] Ramsay, MD  [place] Pager [phone]  If 7PM-7AM, please contact night-coverage [email or web address] Password [email or web address] 08/28/2012, 4:19 PM   LOS: 7 days

## 2012-08-29 ENCOUNTER — Inpatient Hospital Stay (HOSPITAL_COMMUNITY): Payer: Medicare Other

## 2012-08-29 DIAGNOSIS — R7309 Other abnormal glucose: Secondary | ICD-10-CM

## 2012-08-29 LAB — BASIC METABOLIC PANEL
CO2: 25 mEq/L (ref 19–32)
Chloride: 105 mEq/L (ref 96–112)
Potassium: 3.4 mEq/L — ABNORMAL LOW (ref 3.5–5.1)
Sodium: 139 mEq/L (ref 135–145)

## 2012-08-29 LAB — GLUCOSE, CAPILLARY
Glucose-Capillary: 147 mg/dL — ABNORMAL HIGH (ref 70–99)
Glucose-Capillary: 105 mg/dL — ABNORMAL HIGH (ref 70–99)
Glucose-Capillary: 131 mg/dL — ABNORMAL HIGH (ref 70–99)
Glucose-Capillary: 138 mg/dL — ABNORMAL HIGH (ref 70–99)

## 2012-08-29 LAB — CBC
MCV: 66.2 fL — ABNORMAL LOW (ref 78.0–100.0)
Platelets: 251 10*3/uL (ref 150–400)
RBC: 4.88 MIL/uL (ref 4.22–5.81)
WBC: 5.2 10*3/uL (ref 4.0–10.5)

## 2012-08-29 MED ORDER — POTASSIUM CHLORIDE CRYS ER 20 MEQ PO TBCR
20.0000 meq | EXTENDED_RELEASE_TABLET | Freq: Two times a day (BID) | ORAL | Status: DC
  Administered 2012-08-29 (×2): 20 meq via ORAL
  Filled 2012-08-29 (×5): qty 1

## 2012-08-29 MED ORDER — MORPHINE SULFATE 2 MG/ML IJ SOLN
1.0000 mg | INTRAMUSCULAR | Status: DC | PRN
  Administered 2012-08-30 – 2012-09-01 (×3): 2 mg via INTRAVENOUS
  Filled 2012-08-29 (×3): qty 1

## 2012-08-29 MED ORDER — OXYCODONE-ACETAMINOPHEN 5-325 MG PO TABS
1.0000 | ORAL_TABLET | ORAL | Status: DC | PRN
  Administered 2012-08-29 – 2012-09-01 (×6): 2 via ORAL
  Administered 2012-09-02 (×2): 1 via ORAL
  Administered 2012-09-02 – 2012-09-04 (×4): 2 via ORAL
  Filled 2012-08-29: qty 1
  Filled 2012-08-29 (×8): qty 2
  Filled 2012-08-29: qty 1
  Filled 2012-08-29 (×2): qty 2

## 2012-08-29 MED ORDER — GUAIFENESIN-CODEINE 100-10 MG/5ML PO SOLN
10.0000 mL | ORAL | Status: DC | PRN
  Administered 2012-08-29 – 2012-08-31 (×3): 10 mL via ORAL
  Filled 2012-08-29 (×5): qty 10

## 2012-08-29 MED ORDER — FUROSEMIDE 10 MG/ML IJ SOLN
20.0000 mg | Freq: Every day | INTRAMUSCULAR | Status: DC
  Administered 2012-08-29 – 2012-08-31 (×3): 20 mg via INTRAVENOUS
  Filled 2012-08-29 (×3): qty 2

## 2012-08-29 NOTE — Progress Notes (Signed)
6 Days Post-Op  Subjective: In bed, no nausea, some stool, sounds like some diarrhea, some flatus especially after walking.  Still distended.  Objective: Vital signs in last 24 hours: Temp:  [98.2 F (36.8 C)-99.3 F (37.4 C)] 99.1 F (37.3 C) (01/15 0700) Pulse Rate:  [60-66] 66  (01/15 0700) Resp:  [18-20] 18  (01/15 0700) BP: (144-172)/(74-80) 153/79 mmHg (01/15 0700) SpO2:  [95 %-97 %] 97 % (01/15 0700) Last BM Date: 08/28/12  Nothing PO recorded, 2 stools recorded, I ordered clears for diet last PM, afebrile, VSS, K+ 3.4,, I have requested a mag.  Intake/Output from previous day: 01/14 0701 - 01/15 0700 In: 2400 [I.V.:2400] Out: 550 [Urine:550] Intake/Output this shift:    General appearance: alert Resp: BS down some in bases GI: distended, with BS, + flatus and stools yesterday.  Lab Results:   [facility] 08/29/12 0635 08/28/12 0535  WBC 5.2 5.3  HGB 10.2* 10.6*  HCT 32.3* 33.7*  PLT 251 216    BMET  Basename 08/29/12 0635 08/28/12 0535  NA 139 139  K 3.4* 3.3*  CL 105 107  CO2 25 24  GLUCOSE 105* 203*  BUN 4* 5*  CREATININE 0.96 0.82  CALCIUM 8.0* 8.2*   PT/INR No results found for this basename: LABPROT:2,INR:2 in the last 72 hours  No results found for this basename: AST:5,ALT:5,ALKPHOS:5,BILITOT:5,PROT:5,ALBUMIN:5 in the last 168 hours   Lipase     Component Value Date/Time   LIPASE 14 08/21/2012 0406     Studies/Results: Dg Abd Acute W/chest  08/27/2012  *RADIOLOGY REPORT*  Clinical Data: Postop hernia repair.  Ileus.  ACUTE ABDOMEN SERIES (ABDOMEN 2 VIEW & CHEST 1 VIEW)  Comparison: Chest x-ray 08/25/2012 and abdominal film 08/24/2012.  Findings: The upright chest x-ray demonstrates low lung volumes with vascular crowding and streaky atelectasis.  There are persistent small bilateral pleural effusions.  Two views of the abdomen demonstrate the NG tube tip in the antropyloric region of the stomach.  There is persistent contrast in the colon.  The  appendix is visualized.  No distended small bowel loops to suggest obstruction.  No free air.  The soft tissue shadows are maintained.  Surgical skin staples are noted.  IMPRESSION:  1.  Small bilateral pleural effusions and bibasilar atelectasis. 2.  Persistent contrast in the colon. 3.  No findings for small bowel obstruction or free air.   Original Report Authenticated By: Marijo Sanes, M.D.     Medications:    . bisacodyl  10 mg Rectal Daily  . cloNIDine  0.2 mg Transdermal Weekly  . heparin  5,000 Units Subcutaneous Q8H  . insulin aspart  0-15 Units Subcutaneous Q4H  . insulin glargine  5 Units Subcutaneous QHS  . lip balm  1 application Topical BID    Assessment/Plan Bowel obstruction. S/p Exploratory laparotomy with lysis of adhesions. [name], DO, 08/24/2012  1. Small bowel obstruction with history of prior ventral hernia repair and prostatectomy.  2. Adult diabetes type 2 HBA1C 16.3 on admit.  3.Diabetic Retinopathy/neuropathy  4.dyslipidemia  5.Visual impairment.  6. Hx of prostate CA  7. Abnormal EKG with T wave changes lateral leads, 1 degree AV block, repeated by Dr. Rockne Menghini and recommendation of ASA at D/C   Plan:  I pulled his NG it wasn't in beyond the back of his throat anyway.  I will keep him on clears for now, continue to mobilize and repeat dulcolax supp.  Add some K+, mag is pending.  I'm not convinced  he's over this but NG has been clamped and not helping, so we will see how he does.    LOS: 8 days    Chaney Maclaren 08/29/2012

## 2012-08-29 NOTE — Progress Notes (Signed)
Complaining of pain and some nausea.  Abdomen more distended, and they feel his legs are swollen up more too. I am getting a new film, and MS for pain.  His fluid balance per the I/O is about 16 liters.We are replacing K+, MG is ok. He reports another BM and flatus today.

## 2012-08-29 NOTE — Progress Notes (Signed)
INITIAL NUTRITION ASSESSMENT  DOCUMENTATION CODES Per approved criteria  -Not Applicable   INTERVENTION: 1.  Modify diet; Per MD discretion 2.  Nutrition support; if pt expected to remain on clear liquids, would benefit from initiation of nutrition support with close monitoring of blood glucose. 3.  Brief education; with pt and wife r/t to DM management.  Teach back method used.  NUTRITION DIAGNOSIS: Inadequate oral intake related to omission of energy dense foods as evidenced by pt on clear liquids due to SBO.   Monitor:  1.  Food/Beverage; diet advancement with tolerance. 2.  Knowledge; for questions  Reason for Assessment: NPO/Clear liquids x8 days  62 y.o. male  Admitting Dx: Small bowel obstruction  ASSESSMENT: Pt admitted with acute onset abdominal pain and vomiting.  Found to have a small bowel obstruction. Pt is s/p ex lap for lysis of adhesions (1/9), still with abdominal distention. Pt was able to tolerate NGT clamping yesterday, and NGT was removed today.  Pt remains on clear liquids. Pt has been NPO/clear liquids x8 days.    RD notes pt's elevated HgBA1C on admission. Pt had reported running out of meds.    Lab Results  Component Value Date   HGBA1C 16.3* 08/22/2012    RD provided "Carbohydrate Counting for People with Diabetes" handout from the Academy of [employer]. Discussed different food groups and their effects on blood sugar, emphasizing carbohydrate-containing foods. Provided list of carbohydrates and recommended serving sizes of common foods.  Discussed importance of controlled and consistent carbohydrate intake throughout the day. Provided examples of ways to balance meals/snacks and encouraged intake of high-fiber, whole grain complex carbohydrates. Teach back method used.  Expect good compliance.   Height: Ht Readings from Last 1 Encounters:  08/21/12 5\' 7"  (1.702 m)    Weight: Wt Readings from Last 1 Encounters:  08/21/12 174 lb  6.1 oz (79.1 kg)    Ideal Body Weight: 67 kg  % Ideal Body Weight: 117%  Wt Readings from Last 10 Encounters:  08/21/12 174 lb 6.1 oz (79.1 kg)  08/21/12 174 lb 6.1 oz (79.1 kg)  08/19/08 177 lb (80.287 kg)  01/03/08 184 lb (83.462 kg)  10/11/07 184 lb (83.462 kg)  07/19/07 179 lb 8 oz (81.421 kg)    Usual Body Weight: ~180 lbs  % Usual Body Weight: 96%  BMI:  Body mass index is 27.31 kg/(m^2).  Estimated Nutritional Needs: Kcal: 1970-2200 Protein: 79-94g Fluid: ~2.0 L/day  Skin: incision  Diet Order: Clear Liquid  EDUCATION NEEDS: -Education needs addressed   Intake/Output Summary (Last 24 hours) at 08/29/12 1244 Last data filed at 08/28/12 1900  Gross per 24 hour  Intake 1173.33 ml  Output    250 ml  Net 923.33 ml    Last BM: 1/14, abdominal distention  Labs:   Lab 08/29/12 0635 08/28/12 0535 08/27/12 0450  NA 139 139 142  K 3.4* 3.3* 3.8  CL 105 107 110  CO2 25 24 24   BUN 4* 5* 7  CREATININE 0.96 0.82 0.90  CALCIUM 8.0* 8.2* 8.3*  MG 1.7 -- --  PHOS -- -- --  GLUCOSE 105* 203* 172*    CBG (last 3)   Basename 08/29/12 1136 08/29/12 0739 08/29/12 0404  GLUCAP 131* 105* 113*    Scheduled Meds:   . bisacodyl  10 mg Rectal Daily  . cloNIDine  0.2 mg Transdermal Weekly  . heparin  5,000 Units Subcutaneous Q8H  . insulin aspart  0-15 Units Subcutaneous Q4H  .  insulin glargine  5 Units Subcutaneous QHS  . lip balm  1 application Topical BID  . potassium chloride  20 mEq Oral BID PC    Continuous Infusions:   . dextrose 5 % and 0.45 % NaCl with KCl 20 mEq/L 20 mL/hr (08/29/12 1212)    Past Medical History  Diagnosis Date  . Diabetes mellitus without complication   . Cancer   . Prostate cancer     Past Surgical History  Procedure Date  . Hernia repair   . Prostate surgery   . Shoulder surgery   . Laparotomy 08/23/2012    Procedure: EXPLORATORY LAPAROTOMY;  Surgeon: [name], DO;  Location: [facility];  Service: General;   Laterality: N/A;  exploratory laparotomy with lysis of adhesions  . Lysis of adhesion 08/23/2012    Procedure: LYSIS OF ADHESION;  Surgeon: [name], DO;  Location: [facility];  Service: General;;    Brynda Greathouse, MS RD LDN Clinical Inpatient Dietitian Pager: [phone] Weekend/After hours pager: [phone]

## 2012-08-29 NOTE — Progress Notes (Signed)
NG out  Try clears Mobilize more

## 2012-08-29 NOTE — Progress Notes (Signed)
Patient ID: [name], male   DOB: 1951-01-24, 62 y.o.   MRN: [id number]  [facility] PROGRESS NOTE  [name] [id number] DOB: Dec 13, 1950 DOA: 08/21/2012 PCP: No primary provider on file.  Brief narrative:  [name] is a 62 year old man with past medical history of diabetes, diabetic retinopathy, dyslipidemia, and prior prostatectomy as well as ventral hernia repair who was admitted by [name] of the surgical service for treatment of a small bowel obstruction, confirmed by CT scans. The patient states that he normally manages his diabetes with 20 units of Lantus daily as well as oral hypoglycemics. He does not regularly check his blood glucoses. Has a glucose machine but no strips. He does not currently have a primary care doctor. Used to see [name]. Ran out of all of his medications 2 months ago except for the Lantus, which he takes sporadically.   Principal Problem:  *Small bowel obstruction  Management per primary team. Pt is status post ex lap with lysis of adhesion. Pt clinically stable.  Attempt clear liquids today as per surgery recommendations  Active Problems:  Cough   Afebrile, no hypoxia  Incentive spirometry  crackles in lower lung zones, will cut down IVF   Abnormal EKG  LVH with repol abnormality, Chest pain resolved, add  Aspirin at DC.   DIABETES MELLITUS, TYPE II, UNCONTROLLED  Consulted diabetes coordinator and case manager to help with aftercare.  A1C > 16  Continue lantus and SSI, will need further titration once po intake improves  HYPERLIPIDEMIA, MIXED  Patient will need a prescription for his statin therapy and case manager has been consulted to help find him a physician. HYPERTENSION  Continue clonidine patch since currently n.p.o.Will plan on transitioning to PO once pt able to tolerated  Reasonable inpatient control on current regimen  Procedures/Studies:  Dg Chest 2 View  08/25/2012  1. Small bilateral pleural effusions.    2. Tip of nasogastric tube is in the antral prepyloric region of the stomach.  Dg Abd Acute W/chest  08/27/2012  1. Small bilateral pleural effusions and bibasilar atelectasis.  2. Persistent contrast in the colon.  3. No findings for small bowel obstruction or free air.  Ct Abdomen Pelvis W Contrast  08/21/2012  Small bowel obstruction to the level of the distal mid small bowel with abrupt transition point immediately adjacent to the umbilicus. This could be due to adhesion or an occult intrinsic or extrinsic mass lesion. Free fluid is present without loculation to suggest abscess formation, and no free air is identified. This does confer a risk of bowel ischemia. Status post radical prostatectomy without evidence for local recurrence or intra-abdominal/pelvic metastatic disease.   Dg Abd 2 Views  08/22/2012  Similar partial small bowel obstruction, without complicating free intraperitoneal air.  Dg Abd Portable 1v  08/21/2012  NG tube coils in the stomach. Continued high-grade small bowel obstruction pattern.    Code Status: Full  Family Communication: Pt at bedside   HPI/Subjective: Feels sick, nauseous, didn't get much sleep last pm, feels tired today, denies CP or SOB, BM yesterday  Objective: Filed Vitals:   08/28/12 1500 08/28/12 2200 08/29/12 0700 08/29/12 0953  BP: 160/80 144/77 153/79 178/63  Pulse: 60 65 66 69  Temp: 98.2 F (36.8 C) 99.2 F (37.3 C) 99.1 F (37.3 C) 98.3 F (36.8 C)  TempSrc: Oral Oral Oral Oral  Resp: 20 18 18    Height:      Weight:  SpO2: 95% 97% 97% 98%    Intake/Output Summary (Last 24 hours) at 08/29/12 1020 Last data filed at 08/28/12 1900  Gross per 24 hour  Intake 1173.33 ml  Output    550 ml  Net 623.33 ml    Exam:   General:  Pt is alert, follows commands appropriately, not in acute distress  Cardiovascular: Regular rate and rhythm, S1/S2, no murmurs, no rubs, no gallops  Respiratory: basilar crackles  Abdomen: Soft,  mild tenderness,  Distended, BS present, no guarding  Extremities: No edema, pulses DP and PT palpable bilaterally  Neuro: Grossly nonfocal  Data Reviewed: Basic Metabolic Panel:  Lab [id number] 0635 08/28/12 0535 08/27/12 0450 08/26/12 0542 08/25/12 0532  NA 139 139 142 143 142  K 3.4* 3.3* 3.8 3.2* 3.9  CL 105 107 110 111 108  CO2 25 24 24 23 21   GLUCOSE 105* 203* 172* 127* 82  BUN 4* 5* 7 8 12   CREATININE 0.96 0.82 0.90 0.91 1.03  CALCIUM 8.0* 8.2* 8.3* 8.1* 8.0*  MG 1.7 -- -- -- --  PHOS -- -- -- -- --   CBC:  Lab 08/29/12 0635 08/28/12 0535 08/27/12 0450 08/26/12 0542 08/25/12 0532  WBC 5.2 5.3 6.5 6.9 6.5  NEUTROABS -- -- -- -- --  HGB 10.2* 10.6* 10.8* 10.2* 10.6*  HCT 32.3* 33.7* 35.6* 33.5* 34.1*  MCV 66.2* 66.5* 67.8* 68.0* 67.5*  PLT 251 216 190 183 134*   CBG:  Lab 08/29/12 0739 08/29/12 0404 08/29/12 0016 08/28/12 2015 08/28/12 1611  GLUCAP 105* 113* 147* 195* 151*   Scheduled Meds:    . bisacodyl  10 mg Rectal Daily  . cloNIDine  0.2 mg Transdermal Weekly  . heparin  5,000 Units Subcutaneous Q8H  . insulin aspart  0-15 Units Subcutaneous Q4H  . insulin glargine  5 Units Subcutaneous QHS  . lip balm  1 application Topical BID  . potassium chloride  20 mEq Oral BID PC   Continuous Infusions:    . dextrose 5 % and 0.45 % NaCl with KCl 20 mEq/L 75 mL/hr (08/29/12 [id number])     [name] Polite, MD  [place] Pager [phone]  If 7PM-7AM, please contact night-coverage [email or web address] Password [email or web address] 08/29/2012, 10:20 AM   LOS: 8 days

## 2012-08-29 NOTE — Progress Notes (Signed)
Patient complains of abdominal pain 8/10.  No nausea.  Patient passing flatus.  [facility] surgical PA made aware.  Patient medicated with 2 percocet.  Patient to have abdominal xray done.  Patient's wife is concerned that his bilateral legs are swollen all the way up his thighs.  Dr. Broadus John is aware.  Iv fluids are kvo, no lasix as of right now due to poor po intake.  Patient's wife made aware.  Will continue to monitor.  Patient out of bed to the chair and ambulating in the hall way tolerating fairly.

## 2012-08-29 NOTE — Progress Notes (Signed)
Needs to mobilize more Bowel regimen important as well

## 2012-08-30 ENCOUNTER — Ambulatory Visit (HOSPITAL_COMMUNITY): Payer: Medicare Other

## 2012-08-30 ENCOUNTER — Encounter (HOSPITAL_COMMUNITY): Payer: Self-pay | Admitting: Radiology

## 2012-08-30 DIAGNOSIS — E1139 Type 2 diabetes mellitus with other diabetic ophthalmic complication: Secondary | ICD-10-CM

## 2012-08-30 DIAGNOSIS — E876 Hypokalemia: Secondary | ICD-10-CM | POA: Diagnosis present

## 2012-08-30 LAB — BASIC METABOLIC PANEL
CO2: 26 mEq/L (ref 19–32)
Chloride: 102 mEq/L (ref 96–112)
Creatinine, Ser: 0.95 mg/dL (ref 0.50–1.35)
GFR calc Af Amer: >90 mL/min (ref >90)
Sodium: 139 mEq/L (ref 135–145)

## 2012-08-30 LAB — GLUCOSE, CAPILLARY
Glucose-Capillary: 119 mg/dL — ABNORMAL HIGH (ref 70–99)
Glucose-Capillary: 123 mg/dL — ABNORMAL HIGH (ref 70–99)

## 2012-08-30 MED ORDER — POTASSIUM CHLORIDE 10 MEQ/100ML IV SOLN
10.0000 meq | INTRAVENOUS | Status: AC
  Administered 2012-08-30 (×5): 10 meq via INTRAVENOUS
  Filled 2012-08-30 (×5): qty 100

## 2012-08-30 MED ORDER — IOHEXOL 300 MG/ML  SOLN
100.0000 mL | Freq: Once | INTRAMUSCULAR | Status: AC | PRN
  Administered 2012-08-30: 100 mL via INTRAVENOUS

## 2012-08-30 MED ORDER — POTASSIUM CHLORIDE 10 MEQ/100ML IV SOLN
10.0000 meq | INTRAVENOUS | Status: DC
  Filled 2012-08-30 (×4): qty 100

## 2012-08-30 MED ORDER — KCL IN DEXTROSE-NACL 40-5-0.45 MEQ/L-%-% IV SOLN
INTRAVENOUS | Status: DC
  Administered 2012-08-30 – 2012-09-01 (×2): via INTRAVENOUS
  Filled 2012-08-30 (×3): qty 1000

## 2012-08-30 MED ORDER — KCL IN DEXTROSE-NACL 20-5-0.45 MEQ/L-%-% IV SOLN: INTRAVENOUS | Status: DC

## 2012-08-30 MED ORDER — HYDRALAZINE HCL 20 MG/ML IJ SOLN
10.0000 mg | Freq: Three times a day (TID) | INTRAMUSCULAR | Status: DC
  Administered 2012-08-30 – 2012-09-01 (×6): 10 mg via INTRAVENOUS
  Filled 2012-08-30: qty 0.5
  Filled 2012-08-30: qty 1
  Filled 2012-08-30: qty 0.5
  Filled 2012-08-30: qty 1
  Filled 2012-08-30: qty 0.5
  Filled 2012-08-30 (×2): qty 1
  Filled 2012-08-30: qty 0.5
  Filled 2012-08-30: qty 1
  Filled 2012-08-30 (×2): qty 0.5
  Filled 2012-08-30: qty 1

## 2012-08-30 MED ORDER — KCL IN DEXTROSE-NACL 20-5-0.45 MEQ/L-%-% IV SOLN
INTRAVENOUS | Status: DC
  Filled 2012-08-30: qty 1000

## 2012-08-30 NOTE — Progress Notes (Signed)
I have seen and examined the patient and agree with the assessment and plans. CT negative for abscess.  Suspect ileus.  Michaela Broski A. Ninfa Linden  MD, FACS

## 2012-08-30 NOTE — Progress Notes (Signed)
Patient ID: JEHIEL SPRATLEY, male   DOB: March 18, 1951, 62 y.o.   MRN: [id number]  [facility] PROGRESS NOTE  RASHA OLIVIO [id number] DOB: 29-Mar-1951 DOA: 08/21/2012 PCP: No primary provider on file.  Brief narrative:  Mr. Feick is a 62 year old man with past medical history of diabetes, diabetic retinopathy, dyslipidemia, and prior prostatectomy as well as ventral hernia repair who was admitted by Dr. Lilyan Punt of the surgical service for treatment of a small bowel obstruction, confirmed by CT scans. The patient states that he normally manages his diabetes with 20 units of Lantus daily as well as oral hypoglycemics. He does not regularly check his blood glucoses. Has a glucose machine but no strips. He does not currently have a primary care doctor. Used to see Dr. Carlis Abbott. Ran out of all of his medications 2 months ago except for the Lantus, which he takes sporadically.   Principal Problem:  *Small bowel obstruction  Management per primary team. Pt is status post ex lap with lysis of adhesion. KUB with worsening ileus, mgt per CCS Low dose lasix due to worsening lower ext edema from iatrogenic fluid administration Replace K Active Problems:  Cough -improved  Afebrile, no hypoxia  Incentive spirometry  crackles in lower lung zones, will cut down IVF Abnormal EKG  LVH with repol abnormality, Chest pain resolved, add  Aspirin at DC.   DIABETES MELLITUS, TYPE II, stable Consulted diabetes coordinator and case manager to help with aftercare.  A1C > 16  Continue lantus and SSI, will need further titration once po intake improves  HYPERLIPIDEMIA, MIXED  Patient will need a prescription for his statin therapy and case manager has been consulted to help find him a physician. HYPERTENSION  Continue clonidine patch since currently n.p.o.Will plan on transitioning to PO once pt able to tolerated  Add IV hydralazine  Procedures/Studies:  Dg Chest 2 View  08/25/2012  1. Small bilateral  pleural effusions.  2. Tip of nasogastric tube is in the antral prepyloric region of the stomach.  Dg Abd Acute W/chest  08/27/2012  1. Small bilateral pleural effusions and bibasilar atelectasis.  2. Persistent contrast in the colon.  3. No findings for small bowel obstruction or free air.  Ct Abdomen Pelvis W Contrast  08/21/2012  Small bowel obstruction to the level of the distal mid small bowel with abrupt transition point immediately adjacent to the umbilicus. This could be due to adhesion or an occult intrinsic or extrinsic mass lesion. Free fluid is present without loculation to suggest abscess formation, and no free air is identified. This does confer a risk of bowel ischemia. Status post radical prostatectomy without evidence for local recurrence or intra-abdominal/pelvic metastatic disease.   Dg Abd 2 Views  08/22/2012  Similar partial small bowel obstruction, without complicating free intraperitoneal air.  Dg Abd Portable 1v  08/21/2012  NG tube coils in the stomach. Continued high-grade small bowel obstruction pattern.  KUB 1/15 IMPRESSION: 1. Multiple new fluid levels within mildly dilated loops of small bowel is concerning for an ileus or early small bowel obstruction. 2. There is been some transit of contrast within the colon.     Code Status: Full  Family Communication: Pt at bedside   HPI/Subjective: Feels bloated, nauseous, BM x2 yesterday  Objective: Filed Vitals:   08/29/12 1303 08/29/12 1501 08/29/12 2200 08/30/12 0500  BP: 156/66 153/65 122/77 166/79  Pulse: 86 63 62 63  Temp: 99 F (37.2 C) 99 F (37.2 C) 99 F (37.2 C) 98.6  F (37 C)  TempSrc: Oral Oral Oral Oral  Resp:   18 18  Height:      Weight:      SpO2: 100% 100% 99% 94%    Intake/Output Summary (Last 24 hours) at 08/30/12 1010 Last data filed at 08/30/12 0839  Gross per 24 hour  Intake 2090.33 ml  Output    980 ml  Net 1110.33 ml    Exam:   General:  Pt is alert, follows commands  appropriately, not in acute distress  Cardiovascular: Regular rate and rhythm, S1/S2, no murmurs, no rubs, no gallops  Respiratory: basilar crackles  Abdomen: Soft, mild tenderness,  Distended, BS present, no guarding  Extremities: 2 plus edema, pulses DP and PT palpable bilaterally  Neuro: Grossly nonfocal  Data Reviewed: Basic Metabolic Panel:  Lab [id number] 0505 08/29/12 0635 08/28/12 0535 08/27/12 0450 08/26/12 0542  NA 139 139 139 142 143  K 3.0* 3.4* 3.3* 3.8 3.2*  CL 102 105 107 110 111  CO2 26 25 24 24 23   GLUCOSE 102* 105* 203* 172* 127*  BUN 3* 4* 5* 7 8  CREATININE 0.95 0.96 0.82 0.90 0.91  CALCIUM 8.4 8.0* 8.2* 8.3* 8.1*  MG -- 1.7 -- -- --  PHOS -- -- -- -- --   CBC:  Lab 08/29/12 0635 08/28/12 0535 08/27/12 0450 08/26/12 0542 08/25/12 0532  WBC 5.2 5.3 6.5 6.9 6.5  NEUTROABS -- -- -- -- --  HGB 10.2* 10.6* 10.8* 10.2* 10.6*  HCT 32.3* 33.7* 35.6* 33.5* 34.1*  MCV 66.2* 66.5* 67.8* 68.0* 67.5*  PLT 251 216 190 183 134*   CBG:  Lab 08/30/12 0829 08/30/12 0404 08/30/12 0006 08/29/12 2029 08/29/12 1702  GLUCAP 134* 96 123* 131* 138*   Scheduled Meds:    . bisacodyl  10 mg Rectal Daily  . cloNIDine  0.2 mg Transdermal Weekly  . furosemide  20 mg Intravenous Daily  . heparin  5,000 Units Subcutaneous Q8H  . insulin aspart  0-15 Units Subcutaneous Q4H  . insulin glargine  5 Units Subcutaneous QHS  . lip balm  1 application Topical BID  . potassium chloride  10 mEq Intravenous Q1 Hr x 4  . potassium chloride  20 mEq Oral BID PC   Continuous Infusions:    . dextrose 5 % and 0.45 % NaCl with KCl 20 mEq/L 20 mL/hr (08/29/12 1212)     Domenic Polite, MD  [place] Pager [phone]  If 7PM-7AM, please contact night-coverage [email or web address] Password [email or web address] 08/30/2012, 10:10 AM   LOS: 9 days

## 2012-08-30 NOTE — Progress Notes (Signed)
Patient experiencing pain every time he swallows, Patient stated it where the NG tube was before, Will continue to monitor

## 2012-08-30 NOTE — Progress Notes (Signed)
Inpatient Diabetes Program Recommendations  AACE/ADA: New Consensus Statement on Inpatient Glycemic Control (2013)  Target Ranges:  Prepandial:   less than 140 mg/dL      Peak postprandial:   less than 180 mg/dL (1-2 hours)      Critically ill patients:  140 - 180 mg/dL   Reason for Visit: HgbA1C - 16.3%  Results for [name], [name] (MRN [id number]) as of 08/30/2012 12:02  Ref. Range 08/29/2012 00:16 08/29/2012 04:04 08/29/2012 07:39 08/29/2012 11:36 08/29/2012 17:02 08/29/2012 20:29 08/30/2012 00:06 08/30/2012 04:04 08/30/2012 08:29  Glucose-Capillary Latest Range: 70-99 mg/dL 147 (H) 113 (H) 105 (H) 131 (H) 138 (H) 131 (H) 123 (H) 96 134 (H)    Blood sugars well-controlled.  Pt NPO now.  Discussed again importance of controlling blood sugars at home.  Pt verbalized he is willing to "do what I need to do" to control blood sugars.  Discussed glucose monitoring 4 times/day and recording.  Instructed on taking logbook to MD for insulin adjustments.  Will instruct wife on insulin pen administration when she is present (hopefully this afternoon).  Continue with basal/bolus regimen.  Discussed with Dr. Broadus John and RN.  Will follow.  Thank you. Lorenda Peck, RD, LDN, CDE Inpatient Diabetes Coordinator [phone]

## 2012-08-30 NOTE — Progress Notes (Signed)
7 Days Post-Op  Subjective: Still distended, complaining of pain, no improvement.    Objective: Vital signs in last 24 hours: Temp:  [98.6 F (37 C)-99 F (37.2 C)] 98.6 F (37 C) (01/16 0500) Pulse Rate:  [62-86] 63  (01/16 0500) Resp:  [18] 18  (01/16 0500) BP: (122-166)/(65-79) 166/79 mmHg (01/16 0500) SpO2:  [94 %-100 %] 94 % (01/16 0500) Last BM Date: 08/30/12  480 po RECORDED, 4 BM's recorded yesterday,  Diet: clears, afebrile, VSS, K+3.0, i have added mag, film yesterday pm showed prob ileus, with progression of contrast in ascending colon. Intake/Output from previous day: September 26, 2022 0701 - 01/16 0700 In: 2210.3 [P.O.:480; I.V.:1730.3] Out: 650 [Urine:650] Intake/Output this shift: Total I/O In: -  Out: 330 [Urine:330]  General appearance: alert, cooperative and no distress GI: soft, distended, +BS, flatus and some BM, stools are mostly liquid with some solid.  Lab Results:   [facility] September 26, 2012 0635 08/28/12 0535  WBC 5.2 5.3  HGB 10.2* 10.6*  HCT 32.3* 33.7*  PLT 251 216    BMET  Basename 08/30/12 0505 Sep 26, 2012 0635  NA 139 139  K 3.0* 3.4*  CL 102 105  CO2 26 25  GLUCOSE 102* 105*  BUN 3* 4*  CREATININE 0.95 0.96  CALCIUM 8.4 8.0*   PT/INR No results found for this basename: LABPROT:2,INR:2 in the last 72 hours  No results found for this basename: AST:5,ALT:5,ALKPHOS:5,BILITOT:5,PROT:5,ALBUMIN:5 in the last 168 hours   Lipase     Component Value Date/Time   LIPASE 14 08/21/2012 0406     Studies/Results: Dg Abd 2 Views  Sep 26, 2012  *RADIOLOGY REPORT*  Clinical Data: Small bowel obstruction.  Increasing discomfort.  ABDOMEN - 2 VIEW  Comparison: Acute abdominal series 08/28/2011.  Findings: Increasing gaseous distention of small bowel is evident. Multiple fluid levels are present.  Staples are again noted. Contrast in the ascending colon has progressed in the colon.  The colon is relatively collapsed.  IMPRESSION:  1.  Multiple new fluid levels within  mildly dilated loops of small bowel is concerning for an ileus or early small bowel obstruction. 2.  There is been some transit of contrast within the colon.   Original Report Authenticated By: San Morelle, M.D.     Medications:    . bisacodyl  10 mg Rectal Daily  . cloNIDine  0.2 mg Transdermal Weekly  . furosemide  20 mg Intravenous Daily  . heparin  5,000 Units Subcutaneous Q8H  . hydrALAZINE  10 mg Intravenous Q8H  . insulin aspart  0-15 Units Subcutaneous Q4H  . insulin glargine  5 Units Subcutaneous QHS  . lip balm  1 application Topical BID  . potassium chloride  10 mEq Intravenous Q1 Hr x 4  . potassium chloride  20 mEq Oral BID PC    Assessment/Plan Bowel obstruction. S/p Exploratory laparotomy with lysis of adhesions. [name] Hook, DO, 08/24/2012  1. Small bowel obstruction with history of prior ventral hernia repair and prostatectomy.  2. Adult diabetes type 2 HBA1C 16.3 on admit.  3.Diabetic Retinopathy/neuropathy  4.dyslipidemia  5.Visual impairment.  6. Hx of prostate CA  7. Abnormal EKG with T wave changes lateral leads, 1 degree AV block, repeated by Dr. Rockne Menghini and recommendation of ASA at D/C  8. hypokalemia  Plan:  CT of abdomen/pelvis now, replace K+, checking K+.  Dr. Broadus John was just here and has ordered KCL.  Will keep him NPO after he has contrast for CT till we know more.  LOS: 9 days    [name] 08/30/2012

## 2012-08-31 DIAGNOSIS — I10 Essential (primary) hypertension: Secondary | ICD-10-CM

## 2012-08-31 DIAGNOSIS — E876 Hypokalemia: Secondary | ICD-10-CM

## 2012-08-31 LAB — COMPREHENSIVE METABOLIC PANEL
ALT: 20 U/L (ref 0–53)
Alkaline Phosphatase: 82 U/L (ref 39–117)
CO2: 27 mEq/L (ref 19–32)
GFR calc Af Amer: 85 mL/min — ABNORMAL LOW (ref >90)
GFR calc non Af Amer: 74 mL/min — ABNORMAL LOW (ref >90)
Glucose, Bld: 118 mg/dL — ABNORMAL HIGH (ref 70–99)
Potassium: 3.3 mEq/L — ABNORMAL LOW (ref 3.5–5.1)
Sodium: 138 mEq/L (ref 135–145)
Total Protein: 6 g/dL (ref 6.0–8.3)

## 2012-08-31 LAB — GLUCOSE, CAPILLARY
Glucose-Capillary: 107 mg/dL — ABNORMAL HIGH (ref 70–99)
Glucose-Capillary: 158 mg/dL — ABNORMAL HIGH (ref 70–99)

## 2012-08-31 LAB — CBC
Hemoglobin: 11.6 g/dL — ABNORMAL LOW (ref 13.0–17.0)
RBC: 5.56 MIL/uL (ref 4.22–5.81)

## 2012-08-31 MED ORDER — POTASSIUM CHLORIDE CRYS ER 20 MEQ PO TBCR
40.0000 meq | EXTENDED_RELEASE_TABLET | Freq: Once | ORAL | Status: AC
  Administered 2012-08-31: 40 meq via ORAL
  Filled 2012-08-31: qty 2

## 2012-08-31 MED ORDER — SODIUM CHLORIDE 0.9 % IV SOLN
12.5000 mg | Freq: Four times a day (QID) | INTRAVENOUS | Status: DC | PRN
  Administered 2012-08-31: 12.5 mg via INTRAVENOUS
  Filled 2012-08-31 (×2): qty 0.5

## 2012-08-31 MED ORDER — FUROSEMIDE 10 MG/ML IJ SOLN
20.0000 mg | Freq: Two times a day (BID) | INTRAMUSCULAR | Status: DC
  Administered 2012-08-31 – 2012-09-02 (×6): 20 mg via INTRAVENOUS
  Filled 2012-08-31 (×8): qty 2

## 2012-08-31 NOTE — Progress Notes (Signed)
Inpatient Diabetes Program Recommendations  AACE/ADA: New Consensus Statement on Inpatient Glycemic Control (2013)  Target Ranges:  Prepandial:   less than 140 mg/dL      Peak postprandial:   less than 180 mg/dL (1-2 hours)      Critically ill patients:  140 - 180 mg/dL   Reason for Visit: Insulin Pen Instruction  Pt sleeping. Demonstrated insulin pen to wife for home use.  Wife verbalized understanding.  States pt refuses to check blood sugars at home and "eats anything he wants" at home in large quantities.  Discussed how insulin needs have changed from previous home meds.  Wife voiced frustration with pt being noncompliant with controlling blood sugars at home.  Results for KEEFER, SCHUTT (MRN [id number]) as of 08/31/2012 16:56  Ref. Range 08/31/2012 05:03  Sodium Latest Range: 135-145 mEq/L 138  Potassium Latest Range: 3.5-5.1 mEq/L 3.3 (L)  Chloride Latest Range: 96-112 mEq/L 101  CO2 Latest Range: 19-32 mEq/L 27  BUN Latest Range: 6-23 mg/dL 4 (L)  Creatinine Latest Range: 0.50-1.35 mg/dL 1.05  Calcium Latest Range: 8.4-10.5 mg/dL 8.9  GFR calc non Af Amer Latest Range: >90 mL/min 74 (L)  GFR calc Af Amer Latest Range: >90 mL/min 85 (L)  Glucose Latest Range: 70-99 mg/dL 118 (H)  Magnesium Latest Range: 1.5-2.5 mg/dL 1.9  Alkaline Phosphatase Latest Range: 39-117 U/L 82  Albumin Latest Range: 3.5-5.2 g/dL 2.5 (L)  AST Latest Range: 0-37 U/L 32  ALT Latest Range: 0-53 U/L 20  Total Protein Latest Range: 6.0-8.3 g/dL 6.0  Total Bilirubin Latest Range: 0.3-1.2 mg/dL 0.4  Results for CLEAVELAND, SEGA (MRN [id number]) as of 08/31/2012 16:56  Ref. Range 08/31/2012 00:14 08/31/2012 04:04 08/31/2012 07:31 08/31/2012 12:03  Glucose-Capillary Latest Range: 70-99 mg/dL 131 (H) 107 (H) 158 (H) 152 (H)       Note: Continues with good control on Lantus 5 units QHS and Novolog mod Q4.  Will continue to follow.  Thank you. Lorenda Peck, RD, LDN, CDE Inpatient Diabetes  Coordinator [phone]

## 2012-08-31 NOTE — Progress Notes (Signed)
Pt ambulated in the hall and around the 5W unit x2 on RA x1 assist. Pt tolerated the walk well, complains of no pain or nausea.

## 2012-08-31 NOTE — Progress Notes (Signed)
Patient ID: [name], male   DOB: Sep 05, 1950, 62 y.o.   MRN: [id number]  [facility] PROGRESS NOTE  [name] [id number] DOB: 09/02/1950 DOA: 08/21/2012 PCP: No primary provider on file.  Brief narrative:  [name] is a 62 year old man with past medical history of diabetes, diabetic retinopathy, dyslipidemia, and prior prostatectomy as well as ventral hernia repair who was admitted by [name] of the surgical service for treatment of a small bowel obstruction, confirmed by CT scans. The patient states that he normally manages his diabetes with 20 units of Lantus daily as well as oral hypoglycemics. He does not regularly check his blood glucoses. Has a glucose machine but no strips. He does not currently have a primary care doctor. Used to see [name]. Ran out of all of his medications 2 months ago except for the Lantus, which he takes sporadically.   Principal Problem:  *Small bowel obstruction  Management per primary team. Pt is status post ex lap with lysis of adhesion. CT with mild ileus, mgt per CCS Low dose lasix due to worsening lower ext edema from iatrogenic fluid administration Replace K Active Problems:  Cough -improved  Afebrile, no hypoxia  Incentive spirometry  crackles in lower lung zones, will cut down IVF Abnormal EKG  LVH with repol abnormality, Chest pain resolved, add  Aspirin at DC.   DIABETES MELLITUS, TYPE II,  Stable on current regimen A1C > 16  Continue lantus and SSI, will need further titration once po intake improves  HYPERLIPIDEMIA, MIXED  Patient will need a prescription for his statin therapy and case manager has been consulted to help find him a physician. HYPERTENSION  Continue clonidine patch since currently n.p.o.Will plan on transitioning to PO once pt able to tolerated  Add IV hydralazine  Procedures/Studies:   Dg Chest 2 View  08/25/2012  1. Small bilateral pleural effusions.  2. Tip of nasogastric tube is in  the antral prepyloric region of the stomach.   Dg Abd Acute W/chest  08/27/2012  1. Small bilateral pleural effusions and bibasilar atelectasis.  2. Persistent contrast in the colon.  3. No findings for small bowel obstruction or free air.   Ct Abdomen Pelvis W Contrast  08/21/2012  Small bowel obstruction to the level of the distal mid small bowel with abrupt transition point immediately adjacent to the umbilicus. This could be due to adhesion or an occult intrinsic or extrinsic mass lesion. Free fluid is present without loculation to suggest abscess formation, and no free air is identified. This does confer a risk of bowel ischemia. Status post radical prostatectomy without evidence for local recurrence or intra-abdominal/pelvic metastatic disease.   Dg Abd 2 Views  08/22/2012  Similar partial small bowel obstruction, without complicating free intraperitoneal air.   Dg Abd Portable 1v  08/21/2012  NG tube coils in the stomach. Continued high-grade small bowel obstruction pattern.  KUB 1/15 IMPRESSION: 1. Multiple new fluid levels within mildly dilated loops of small bowel is concerning for an ileus or early small bowel obstruction. 2. There is been some transit of contrast within the colon.     Code Status: Full  Family Communication: Pt at bedside   HPI/Subjective: Still feels bloated, less nausea, BM x2 yesterday  Objective: Filed Vitals:   08/30/12 1351 08/30/12 2039 08/30/12 2309 08/31/12 0410  BP: 183/81 150/71 125/66 158/68  Pulse: 75  74 72  Temp: 98.7 F (37.1 C)  98 F (36.7 C) 97.2 F (36.2 C)  TempSrc: Oral  Oral Oral  Resp: 18  18   Height:      Weight:      SpO2: 98%  97% 96%    Intake/Output Summary (Last 24 hours) at 08/31/12 1007 Last data filed at 08/31/12 0600  Gross per 24 hour  Intake 1198.75 ml  Output    700 ml  Net 498.75 ml    Exam:   General:  Pt is alert, follows commands appropriately, not in acute distress  Cardiovascular:  Regular rate and rhythm, S1/S2, no murmurs, no rubs, no gallops  Respiratory: basilar crackles  Abdomen: Soft, mild tenderness,  Distended, BS present, no guarding  Extremities: 2 plus edema, pulses DP and PT palpable bilaterally  Neuro: Grossly nonfocal  Data Reviewed: Basic Metabolic Panel:  Lab [id number] 0503 08/30/12 0510 08/30/12 0505 08/29/12 0635 08/28/12 0535 08/27/12 0450  NA 138 -- 139 139 139 142  K 3.3* -- 3.0* 3.4* 3.3* 3.8  CL 101 -- 102 105 107 110  CO2 27 -- 26 25 24 24   GLUCOSE 118* -- 102* 105* 203* 172*  BUN 4* -- 3* 4* 5* 7  CREATININE 1.05 -- 0.95 0.96 0.82 0.90  CALCIUM 8.9 -- 8.4 8.0* 8.2* 8.3*  MG 1.9 1.7 -- 1.7 -- --  PHOS -- -- -- -- -- --   CBC:  Lab 08/31/12 0503 08/29/12 0635 08/28/12 0535 08/27/12 0450 08/26/12 0542  WBC 4.8 5.2 5.3 6.5 6.9  NEUTROABS -- -- -- -- --  HGB 11.6* 10.2* 10.6* 10.8* 10.2*  HCT 36.4* 32.3* 33.7* 35.6* 33.5*  MCV 65.5* 66.2* 66.5* 67.8* 68.0*  PLT 262 251 216 190 183   CBG:  Lab 08/31/12 0731 08/31/12 0404 08/31/12 0014 08/30/12 2018 08/30/12 1709  GLUCAP 158* 107* 131* 146* 111*   Scheduled Meds:    . bisacodyl  10 mg Rectal Daily  . cloNIDine  0.2 mg Transdermal Weekly  . furosemide  20 mg Intravenous Daily  . heparin  5,000 Units Subcutaneous Q8H  . hydrALAZINE  10 mg Intravenous Q8H  . insulin aspart  0-15 Units Subcutaneous Q4H  . insulin glargine  5 Units Subcutaneous QHS  . lip balm  1 application Topical BID  . potassium chloride  40 mEq Oral Once   Continuous Infusions:    . dextrose 5 % and 0.45 % NaCl with KCl 40 mEq/L 75 mL/hr at 08/30/12 1153     [name] Lagace, MD  [facility] Pager [phone]  If 7PM-7AM, please contact night-coverage [email or web address] Password [email or web address] 08/31/2012, 10:07 AM   LOS: 10 days

## 2012-08-31 NOTE — Progress Notes (Signed)
8 Days Post-Op  Subjective: Still distended, pain better, c/o hiccups, no nausea, having flatus and several BM's a day   Objective: Vital signs in last 24 hours: Temp:  [97.2 F (36.2 C)-98.7 F (37.1 C)] 97.2 F (36.2 C) (01/17 0410) Pulse Rate:  [72-75] 72  (01/17 0410) Resp:  [18] 18  (01/16 2309) BP: (125-183)/(66-81) 158/68 mmHg (01/17 0410) SpO2:  [96 %-98 %] 96 % (01/17 0410) Last BM Date: 08/31/12  480 po RECORDED, 4 BM's recorded yesterday,  Diet: clears, afebrile, VSS, K+3.0, i have added mag, film yesterday pm showed prob ileus, with progression of contrast in ascending colon. Intake/Output from previous day: 01/16 0701 - 01/17 0700 In: 1198.8 [I.V.:1098.8] Out: 1030 [Urine:1030] Intake/Output this shift:    General appearance: alert, cooperative and no distress GI: soft, distended, +BS Inc: C/D/I  Lab Results:   Basename 08/31/12 0503 08/29/12 0635  WBC 4.8 5.2  HGB 11.6* 10.2*  HCT 36.4* 32.3*  PLT 262 251    BMET  Basename 08/31/12 0503 08/30/12 0505  NA 138 139  K 3.3* 3.0*  CL 101 102  CO2 27 26  GLUCOSE 118* 102*  BUN 4* 3*  CREATININE 1.05 0.95  CALCIUM 8.9 8.4   PT/INR No results found for this basename: LABPROT:2,INR:2 in the last 72 hours   Lab 08/31/12 0503  AST 32  ALT 20  ALKPHOS 82  BILITOT 0.4  PROT 6.0  ALBUMIN 2.5*     Lipase     Component Value Date/Time   LIPASE 14 08/21/2012 0406     Studies/Results: Ct Abdomen Pelvis W Contrast  08/30/2012  *RADIOLOGY REPORT*  Clinical Data: Postop small bowel obstruction with lysis of adhesions.  Diffuse abdominal pain, bloating.  CT ABDOMEN AND PELVIS WITH CONTRAST  Technique:  Multidetector CT imaging of the abdomen and pelvis was performed following the standard protocol during bolus administration of intravenous contrast.  Contrast: 157mL OMNIPAQUE IOHEXOL 300 MG/ML  SOLN  Comparison: Plain films 08/29/2012  Findings: There are small bilateral pleural effusions, right greater  than left.  Compressive atelectasis in the lower lobes, also greater on the right.  Heart is normal size.  Mild prominence of the mid abdominal small bowel loops.  No well- defined transition to normal caliber small bowel.  Air noted throughout the colon.  The contrast material has passed into the distal small bowel and cecum.  Small amount of free fluid around the liver and in the pelvis. Liver, spleen, pancreas, adrenals and kidneys are normal.  Urinary bladder is decompressed, grossly unremarkable.  Surgical clips in the pelvis from prior prostatectomy.  Aorta is normal caliber.  No free air or adenopathy.  No acute bony abnormality.  IMPRESSION: Continued mild prominence of small bowel loops with gradual transition to decompressed small bowel.  Contrast has passed into the distal small bowel and cecum.  Bowel gas pattern is nonspecific, possibly mild ileus.  Small amount of free fluid in the abdomen and pelvis.  Small bilateral effusions, right greater than left.   Original Report Authenticated By: Rolm Baptise, M.D.    Dg Abd 2 Views  08/29/2012  *RADIOLOGY REPORT*  Clinical Data: Small bowel obstruction.  Increasing discomfort.  ABDOMEN - 2 VIEW  Comparison: Acute abdominal series 08/28/2011.  Findings: Increasing gaseous distention of small bowel is evident. Multiple fluid levels are present.  Staples are again noted. Contrast in the ascending colon has progressed in the colon.  The colon is relatively collapsed.  IMPRESSION:  1.  Multiple new fluid levels within mildly dilated loops of small bowel is concerning for an ileus or early small bowel obstruction. 2.  There is been some transit of contrast within the colon.   Original Report Authenticated By: San Morelle, M.D.     Medications:    . bisacodyl  10 mg Rectal Daily  . cloNIDine  0.2 mg Transdermal Weekly  . furosemide  20 mg Intravenous Daily  . heparin  5,000 Units Subcutaneous Q8H  . hydrALAZINE  10 mg Intravenous Q8H  . insulin  aspart  0-15 Units Subcutaneous Q4H  . insulin glargine  5 Units Subcutaneous QHS  . lip balm  1 application Topical BID    Assessment/Plan Bowel obstruction. S/p Exploratory laparotomy with lysis of adhesions. [name] Hook, DO, 08/24/2012  1. Small bowel obstruction with history of prior ventral hernia repair and prostatectomy.  2. Adult diabetes type 2 HBA1C 16.3 on admit.  3.Diabetic Retinopathy/neuropathy  4.dyslipidemia  5.Visual impairment.  6. Hx of prostate CA  7. Abnormal EKG with T wave changes lateral leads, 1 degree AV block, repeated by Dr. Rockne Menghini and recommendation of ASA at D/C  8. hypokalemia  Plan: CT yesterday consistent with mild ileus- seems to be improving.  Will advance diet to full liquids.  PO potassium replacement.  Thorazine for hiccups at night.    LOS: 10 days    [name] C. [id number]

## 2012-09-01 LAB — GLUCOSE, CAPILLARY
Glucose-Capillary: 127 mg/dL — ABNORMAL HIGH (ref 70–99)
Glucose-Capillary: 189 mg/dL — ABNORMAL HIGH (ref 70–99)

## 2012-09-01 MED ORDER — INSULIN ASPART 100 UNIT/ML ~~LOC~~ SOLN: 0.0000 [IU] | Freq: Every day | SUBCUTANEOUS | Status: DC

## 2012-09-01 MED ORDER — POTASSIUM CHLORIDE CRYS ER 20 MEQ PO TBCR
40.0000 meq | EXTENDED_RELEASE_TABLET | Freq: Two times a day (BID) | ORAL | Status: AC
  Administered 2012-09-01 – 2012-09-02 (×3): 40 meq via ORAL
  Filled 2012-09-01 (×3): qty 2

## 2012-09-01 MED ORDER — INSULIN ASPART 100 UNIT/ML ~~LOC~~ SOLN
0.0000 [IU] | Freq: Three times a day (TID) | SUBCUTANEOUS | Status: DC
  Administered 2012-09-02: 2 [IU] via SUBCUTANEOUS
  Administered 2012-09-02 – 2012-09-03 (×3): 3 [IU] via SUBCUTANEOUS
  Administered 2012-09-03 – 2012-09-04 (×3): 2 [IU] via SUBCUTANEOUS
  Administered 2012-09-04: 3 [IU] via SUBCUTANEOUS

## 2012-09-01 NOTE — Progress Notes (Signed)
Patient ID: [name], male   DOB: 1951-03-15, 62 y.o.   MRN: [id number]  [facility] PROGRESS NOTE  [name] [id number] DOB: 30-Apr-1951 DOA: 08/21/2012 PCP: No primary provider on file.  Brief narrative:  [name] is a 62 year old man with past medical history of diabetes, diabetic retinopathy, dyslipidemia, and prior prostatectomy as well as ventral hernia repair who was admitted by [name] of the surgical service for treatment of a small bowel obstruction, confirmed by CT scans. The patient states that he normally manages his diabetes with 20 units of Lantus daily as well as oral hypoglycemics. He does not regularly check his blood glucoses. Has a glucose machine but no strips. He does not currently have a primary care doctor. Used to see [name]. Ran out of all of his medications 2 months ago except for the Lantus, which he takes sporadically.   Principal Problem:  *Small bowel obstruction  Management per primary team. Pt is status post ex lap with lysis of adhesion. CT with mild ileus, mgt per CCS Low dose lasix due to worsening lower ext edema from iatrogenic fluid administration Replace K Active Problems:  Cough -improved  Afebrile, no hypoxia  Incentive spirometry  Abnormal EKG  LVH with repol abnormality, Chest pain resolved, add  Aspirin at DC.   DIABETES MELLITUS, TYPE II,  Stable on current regimen A1C > 16  Continue lantus and SSI, will need further titration once po intake improves  HYPERLIPIDEMIA, MIXED  Patient will need a prescription for his statin therapy and case manager has been consulted to help find him a physician. HYPERTENSION  Continue clonidine patch since currently n.p.o.Will plan on transitioning to PO once pt able to tolerated  DC IV hydralazine  Procedures/Studies:   Dg Chest 2 View  08/25/2012  1. Small bilateral pleural effusions.  2. Tip of nasogastric tube is in the antral prepyloric region of the stomach.   Dg  Abd Acute W/chest  08/27/2012  1. Small bilateral pleural effusions and bibasilar atelectasis.  2. Persistent contrast in the colon.  3. No findings for small bowel obstruction or free air.   Ct Abdomen Pelvis W Contrast  08/21/2012  Small bowel obstruction to the level of the distal mid small bowel with abrupt transition point immediately adjacent to the umbilicus. This could be due to adhesion or an occult intrinsic or extrinsic mass lesion. Free fluid is present without loculation to suggest abscess formation, and no free air is identified. This does confer a risk of bowel ischemia. Status post radical prostatectomy without evidence for local recurrence or intra-abdominal/pelvic metastatic disease.   Dg Abd 2 Views  08/22/2012  Similar partial small bowel obstruction, without complicating free intraperitoneal air.   Dg Abd Portable 1v  08/21/2012  NG tube coils in the stomach. Continued high-grade small bowel obstruction pattern.  KUB 1/15 IMPRESSION: 1. Multiple new fluid levels within mildly dilated loops of small bowel is concerning for an ileus or early small bowel obstruction. 2. There is been some transit of contrast within the colon.     Code Status: Full  Family Communication: Pt at bedside   HPI/Subjective: Still feels bloated, less nausea, BM x2 today, leg swelling coming down  Objective: Filed Vitals:   08/31/12 0410 08/31/12 1409 08/31/12 2116 09/01/12 0422  BP: 158/68 113/66 142/60 124/65  Pulse: 72 81    Temp: 97.2 F (36.2 C) 98.9 F (37.2 C)    TempSrc: Oral Oral    Resp:  20    Height:      Weight:      SpO2: 96% 95%      Intake/Output Summary (Last 24 hours) at 09/01/12 1016 Last data filed at 09/01/12 [id number]  Gross per 24 hour  Intake 2256.66 ml  Output    450 ml  Net 1806.66 ml    Exam:   General:  Pt is alert, follows commands appropriately, not in acute distress  Cardiovascular: Regular rate and rhythm, S1/S2, no murmurs, no rubs, no  gallops  Respiratory: basilar crackles  Abdomen: Soft, mild tenderness,  Distended, BS present, no guarding  Extremities: 1-2 plus edema, pulses DP and PT palpable bilaterally  Neuro: Grossly nonfocal  Data Reviewed: Basic Metabolic Panel:  Lab [id number] 08/30/12 0510 08/30/12 0505 08/29/12 0635 08/28/12 0535 08/27/12 0450  NA 138 -- 139 139 139 142  K 3.3* -- 3.0* 3.4* 3.3* 3.8  CL 101 -- 102 105 107 110  CO2 27 -- 26 25 24 24   GLUCOSE 118* -- 102* 105* 203* 172*  BUN 4* -- 3* 4* 5* 7  CREATININE 1.05 -- 0.95 0.96 0.82 0.90  CALCIUM 8.9 -- 8.4 8.0* 8.2* 8.3*  MG 1.9 1.7 -- 1.7 -- --  PHOS -- -- -- -- -- --   CBC:  Lab 08/31/12 0503 08/29/12 0635 08/28/12 0535 08/27/12 0450 08/26/12 0542  WBC 4.8 5.2 5.3 6.5 6.9  NEUTROABS -- -- -- -- --  HGB 11.6* 10.2* 10.6* 10.8* 10.2*  HCT 36.4* 32.3* 33.7* 35.6* 33.5*  MCV 65.5* 66.2* 66.5* 67.8* 68.0*  PLT 262 251 216 190 183   CBG:  Lab 09/01/12 0725 09/01/12 0415 09/01/12 0007 08/31/12 2043 08/31/12 1705  GLUCAP 122* 146* 189* 144* 120*   Scheduled Meds:    . bisacodyl  10 mg Rectal Daily  . cloNIDine  0.2 mg Transdermal Weekly  . furosemide  20 mg Intravenous Q12H  . heparin  5,000 Units Subcutaneous Q8H  . insulin aspart  0-15 Units Subcutaneous Q4H  . insulin glargine  5 Units Subcutaneous QHS  . lip balm  1 application Topical BID  . potassium chloride  40 mEq Oral BID   Continuous Infusions:    . dextrose 5 % and 0.45 % NaCl with KCl 40 mEq/L 20 mL/hr at 09/01/12 [name] Ferries, MD  [facility] Pager [phone]  If 7PM-7AM, please contact night-coverage [email or web address] Password [email or web address] 09/01/2012, 10:16 AM   LOS: 11 days

## 2012-09-01 NOTE — Progress Notes (Signed)
9 Days Post-Op  Subjective: Still distended, pain better, hiccups better, no nausea, having flatus and several loose BM's a day.  Tolerating Fulls.  Ambulating  Objective: Vital signs in last 24 hours: Temp:  [98.9 F (37.2 C)] 98.9 F (37.2 C) (01/17 1409) Pulse Rate:  [81] 81  (01/17 1409) Resp:  [20] 20  (01/17 1409) BP: (113-142)/(60-66) 124/65 mmHg (01/18 0422) SpO2:  [95 %] 95 % (01/17 1409) Last BM Date: 08/31/12   Intake/Output from previous day: 01/17 0701 - 01/18 0700 In: 2256.7 [P.O.:240; I.V.:1987.7; IV Piggyback:29] Out: 1150 [Urine:1150] Intake/Output this shift:    General appearance: alert, cooperative and no distress GI: soft, less distended, +BS Inc: C/D/I  Lab Results:   Basename 08/31/12 0503  WBC 4.8  HGB 11.6*  HCT 36.4*  PLT 262    BMET  Basename 08/31/12 0503 08/30/12 0505  NA 138 139  K 3.3* 3.0*  CL 101 102  CO2 27 26  GLUCOSE 118* 102*  BUN 4* 3*  CREATININE 1.05 0.95  CALCIUM 8.9 8.4   PT/INR No results found for this basename: LABPROT:2,INR:2 in the last 72 hours   Lab 08/31/12 0503  AST 32  ALT 20  ALKPHOS 82  BILITOT 0.4  PROT 6.0  ALBUMIN 2.5*     Lipase     Component Value Date/Time   LIPASE 14 08/21/2012 0406     Studies/Results: Ct Abdomen Pelvis W Contrast  08/30/2012  *RADIOLOGY REPORT*  Clinical Data: Postop small bowel obstruction with lysis of adhesions.  Diffuse abdominal pain, bloating.  CT ABDOMEN AND PELVIS WITH CONTRAST  Technique:  Multidetector CT imaging of the abdomen and pelvis was performed following the standard protocol during bolus administration of intravenous contrast.  Contrast: 129mL OMNIPAQUE IOHEXOL 300 MG/ML  SOLN  Comparison: Plain films 08/29/2012  Findings: There are small bilateral pleural effusions, right greater than left.  Compressive atelectasis in the lower lobes, also greater on the right.  Heart is normal size.  Mild prominence of the mid abdominal small bowel loops.  No well-  defined transition to normal caliber small bowel.  Air noted throughout the colon.  The contrast material has passed into the distal small bowel and cecum.  Small amount of free fluid around the liver and in the pelvis. Liver, spleen, pancreas, adrenals and kidneys are normal.  Urinary bladder is decompressed, grossly unremarkable.  Surgical clips in the pelvis from prior prostatectomy.  Aorta is normal caliber.  No free air or adenopathy.  No acute bony abnormality.  IMPRESSION: Continued mild prominence of small bowel loops with gradual transition to decompressed small bowel.  Contrast has passed into the distal small bowel and cecum.  Bowel gas pattern is nonspecific, possibly mild ileus.  Small amount of free fluid in the abdomen and pelvis.  Small bilateral effusions, right greater than left.   Original Report Authenticated By: Rolm Baptise, M.D.     Medications:    . bisacodyl  10 mg Rectal Daily  . cloNIDine  0.2 mg Transdermal Weekly  . furosemide  20 mg Intravenous Q12H  . heparin  5,000 Units Subcutaneous Q8H  . hydrALAZINE  10 mg Intravenous Q8H  . insulin aspart  0-15 Units Subcutaneous Q4H  . insulin glargine  5 Units Subcutaneous QHS  . lip balm  1 application Topical BID    Assessment/Plan Bowel obstruction. S/p Exploratory laparotomy with lysis of adhesions. [name], DO, 08/24/2012  1. Small bowel obstruction with history of prior ventral hernia  repair and prostatectomy.  2. Adult diabetes type 2 HBA1C 16.3 on admit.  3.Diabetic Retinopathy/neuropathy  4.dyslipidemia  5.Visual impairment.  6. Hx of prostate CA  7. Abnormal EKG with T wave changes lateral leads, 1 degree AV block, repeated by Dr. Rockne Menghini and recommendation of ASA at D/C  8. hypokalemia  Plan: CT yesterday consistent with mild ileus.  Will continue full liquids.  PO potassium replacement.  Thorazine for hiccups at night.    LOS: 11 days    Zeinab Rodwell C. [phone]

## 2012-09-02 LAB — GLUCOSE, CAPILLARY
Glucose-Capillary: 190 mg/dL — ABNORMAL HIGH (ref 70–99)
Glucose-Capillary: 184 mg/dL — ABNORMAL HIGH (ref 70–99)
Glucose-Capillary: 120 mg/dL — ABNORMAL HIGH (ref 70–99)

## 2012-09-02 LAB — BASIC METABOLIC PANEL
CO2: 27 mEq/L (ref 19–32)
Chloride: 101 mEq/L (ref 96–112)
GFR calc Af Amer: 85 mL/min — ABNORMAL LOW (ref >90)
Potassium: 4.5 mEq/L (ref 3.5–5.1)
Sodium: 136 mEq/L (ref 135–145)

## 2012-09-02 MED ORDER — ENSURE PUDDING PO PUDG
1.0000 | Freq: Three times a day (TID) | ORAL | Status: DC
  Administered 2012-09-02 – 2012-09-04 (×4): 1 via ORAL
  Filled 2012-09-02 (×8): qty 1

## 2012-09-02 NOTE — Progress Notes (Signed)
Patient ID: [name], male   DOB: 1950/09/21, 62 y.o.   MRN: [id number]  [facility] PROGRESS NOTE  [name] [id number] DOB: 04/09/1951 DOA: 08/21/2012 PCP: No primary provider on file.  Brief narrative:  [name] is a 62 year old man with past medical history of diabetes, diabetic retinopathy, dyslipidemia, and prior prostatectomy as well as ventral hernia repair who was admitted by Dr. Lilyan [name] of the surgical service for treatment of a small bowel obstruction, confirmed by CT scans. The patient states that he normally manages his diabetes with 20 units of Lantus daily as well as oral hypoglycemics. He does not regularly check his blood glucoses. Has a glucose machine but no strips. He does not currently have a primary care doctor. Used to see Dr. Carlis [name]. Ran out of all of his medications 2 months ago except for the Lantus, which he takes sporadically.   Principal Problem:       1. Small bowel obstruction  Management per primary team. Pt is status post ex lap with lysis of adhesion. CT with mild ileus, mgt per CCS, improving ? Advance to regular diet  2. Edema: from iatrogenic fluid administration Low dose lasix due to worsening lower ext edema from iatrogenic fluid administration        Replace K        3. Abnormal EKG         LVH with repol abnormality, Chest pain resolved, add  Aspirin at DC        4. Diabetes Mellitus:         Hbaic >16        CBG stable on current regimen of Lantus, SSI        5. HTN: stable,  Continue clonidine patch , DC IV hydralazine  DVT proph: lovenox   Procedures/Studies:   Dg Chest 2 View  08/25/2012  1. Small bilateral pleural effusions.  2. Tip of nasogastric tube is in the antral prepyloric region of the stomach.   Dg Abd Acute W/chest  08/27/2012  1. Small bilateral pleural effusions and bibasilar atelectasis.  2. Persistent contrast in the colon.  3. No findings for small bowel obstruction or free air.   Ct Abdomen  Pelvis W Contrast  08/21/2012  Small bowel obstruction to the level of the distal mid small bowel with abrupt transition point immediately adjacent to the umbilicus. This could be due to adhesion or an occult intrinsic or extrinsic mass lesion. Free fluid is present without loculation to suggest abscess formation, and no free air is identified. This does confer a risk of bowel ischemia. Status post radical prostatectomy without evidence for local recurrence or intra-abdominal/pelvic metastatic disease.   Dg Abd 2 Views  08/22/2012  Similar partial small bowel obstruction, without complicating free intraperitoneal air.   Dg Abd Portable 1v  08/21/2012  NG tube coils in the stomach. Continued high-grade small bowel obstruction pattern.  KUB 1/15 IMPRESSION: 1. Multiple new fluid levels within mildly dilated loops of small bowel is concerning for an ileus or early small bowel obstruction. 2. There is been some transit of contrast within the colon.     Code Status: Full  Family Communication: Pt at bedside   HPI/Subjective:  feels less bloated, less nausea, BM x2 today, leg swelling coming down  Objective: Filed Vitals:   08/31/12 2116 09/01/12 0422 09/01/12 1336 09/02/12 0600  BP: 142/60 124/65 151/85 135/76  Pulse:   76 68  Temp:   98.4 F (36.9 C)  98 F (36.7 C)  TempSrc:   Oral Oral  Resp:   20 18  Height:      Weight:      SpO2:   96% 94%    Intake/Output Summary (Last 24 hours) at 09/02/12 0851 Last data filed at 09/02/12 [id number]  Gross per 24 hour  Intake 842.66 ml  Output   1001 ml  Net -158.34 ml    Exam:   General:  Pt is alert, follows commands appropriately, not in acute distress  Cardiovascular: Regular rate and rhythm, S1/S2, no murmurs, no rubs, no gallops  Respiratory: basilar crackles  Abdomen: Soft, mild tenderness,  Distended, BS present, no guarding  Extremities: 1-2 plus edema, pulses DP and PT palpable bilaterally  Neuro: Grossly  nonfocal  Data Reviewed: Basic Metabolic Panel:  Lab [id number] 0536 08/31/12 0503 08/30/12 0510 08/30/12 0505 08/29/12 0635 08/28/12 0535  NA 136 138 -- 139 139 139  K 4.5 3.3* -- 3.0* 3.4* 3.3*  CL 101 101 -- 102 105 107  CO2 27 27 -- 26 25 24   GLUCOSE 232* 118* -- 102* 105* 203*  BUN 3* 4* -- 3* 4* 5*  CREATININE 1.05 1.05 -- 0.95 0.96 0.82  CALCIUM 8.9 8.9 -- 8.4 8.0* 8.2*  MG -- 1.9 1.7 -- 1.7 --  PHOS -- -- -- -- -- --   CBC:  Lab 08/31/12 0503 08/29/12 0635 08/28/12 0535 08/27/12 0450  WBC 4.8 5.2 5.3 6.5  NEUTROABS -- -- -- --  HGB 11.6* 10.2* 10.6* 10.8*  HCT 36.4* 32.3* 33.7* 35.6*  MCV 65.5* 66.2* 66.5* 67.8*  PLT 262 251 216 190   CBG:  Lab 09/02/12 0801 09/02/12 0356 09/01/12 2343 09/01/12 1709 09/01/12 1139  GLUCAP 184* 211* 190* 127* 187*   Scheduled Meds:    . bisacodyl  10 mg Rectal Daily  . cloNIDine  0.2 mg Transdermal Weekly  . furosemide  20 mg Intravenous Q12H  . heparin  5,000 Units Subcutaneous Q8H  . insulin aspart  0-15 Units Subcutaneous TID WC  . insulin aspart  0-5 Units Subcutaneous QHS  . insulin glargine  5 Units Subcutaneous QHS  . lip balm  1 application Topical BID  . potassium chloride  40 mEq Oral BID   Continuous Infusions:     [name] Polite, MD  [facility] Pager [phone]  If 7PM-7AM, please contact night-coverage [email or web address] Password [email or web address] 09/02/2012, 8:51 AM   LOS: 12 days

## 2012-09-02 NOTE — Progress Notes (Signed)
10 Days Post-Op  Subjective: Less distended, pain better, hiccups better, no nausea, having flatus and several loose BM's a day.  Tolerating Fulls.  Ambulating.  C/O sore throat and cough productive of clear sputum  Objective: Vital signs in last 24 hours: Temp:  [98 F (36.7 C)-98.4 F (36.9 C)] 98 F (36.7 C) (01/19 0600) Pulse Rate:  [68-76] 68  (01/19 0600) Resp:  [18-20] 18  (01/19 0600) BP: (135-151)/(76-85) 135/76 mmHg (01/19 0600) SpO2:  [94 %-96 %] 94 % (01/19 0600) Last BM Date: 09/02/12   Intake/Output from previous day: 01/18 0701 - 01/19 0700 In: 842.7 [P.O.:360; I.V.:478.7; IV Piggyback:4] Out: 1001 [Urine:1000; Stool:1] Intake/Output this shift:    General appearance: alert, cooperative and no distress No cervical lymphadenopathy GI: soft, less distended, +BS Inc: C/D/I  Lab Results:   Basename 08/31/12 0503  WBC 4.8  HGB 11.6*  HCT 36.4*  PLT 262    BMET  Basename 09/02/12 0536 08/31/12 0503  NA 136 138  K 4.5 3.3*  CL 101 101  CO2 27 27  GLUCOSE 232* 118*  BUN 3* 4*  CREATININE 1.05 1.05  CALCIUM 8.9 8.9   PT/INR No results found for this basename: LABPROT:2,INR:2 in the last 72 hours   Lab 08/31/12 0503  AST 32  ALT 20  ALKPHOS 82  BILITOT 0.4  PROT 6.0  ALBUMIN 2.5*     Lipase     Component Value Date/Time   LIPASE 14 08/21/2012 0406     Studies/Results: No results found.  Medications:    . bisacodyl  10 mg Rectal Daily  . cloNIDine  0.2 mg Transdermal Weekly  . feeding supplement  1 Container Oral TID BM  . furosemide  20 mg Intravenous Q12H  . heparin  5,000 Units Subcutaneous Q8H  . insulin aspart  0-15 Units Subcutaneous TID WC  . insulin aspart  0-5 Units Subcutaneous QHS  . insulin glargine  5 Units Subcutaneous QHS  . lip balm  1 application Topical BID    Assessment/Plan Bowel obstruction. S/p Exploratory laparotomy with lysis of adhesions. [name] Hook, [name], 08/24/2012  1. Small bowel obstruction with  history of prior ventral hernia repair and prostatectomy.  2. Adult diabetes type 2 HBA1C 16.3 on admit.  3.Diabetic Retinopathy/neuropathy  4.dyslipidemia  5.Visual impairment.  6. Hx of prostate CA  7. Abnormal EKG with T wave changes lateral leads, 1 degree AV block, repeated by Dr. Rockne Menghini and recommendation of ASA at D/C  8. hypokalemia  Doing better.  Patient would like to cont full liquids due to a sore throat.  Will advance to reg diet in AM    LOS: 12 days    [name] Rehfeld C. [id number]

## 2012-09-03 ENCOUNTER — Encounter (HOSPITAL_COMMUNITY): Payer: Self-pay | Admitting: General Surgery

## 2012-09-03 DIAGNOSIS — E8779 Other fluid overload: Secondary | ICD-10-CM

## 2012-09-03 DIAGNOSIS — K567 Ileus, unspecified: Secondary | ICD-10-CM | POA: Diagnosis not present

## 2012-09-03 DIAGNOSIS — E8770 Fluid overload, unspecified: Secondary | ICD-10-CM

## 2012-09-03 HISTORY — DX: Fluid overload, unspecified: E87.70

## 2012-09-03 LAB — GLUCOSE, CAPILLARY: Glucose-Capillary: 121 mg/dL — ABNORMAL HIGH (ref 70–99)

## 2012-09-03 LAB — BASIC METABOLIC PANEL
CO2: 32 mEq/L (ref 19–32)
GFR calc non Af Amer: 63 mL/min — ABNORMAL LOW (ref >90)
Glucose, Bld: 129 mg/dL — ABNORMAL HIGH (ref 70–99)
Potassium: 4.9 mEq/L (ref 3.5–5.1)
Sodium: 137 mEq/L (ref 135–145)

## 2012-09-03 MED ORDER — FUROSEMIDE 20 MG PO TABS
20.0000 mg | ORAL_TABLET | Freq: Every day | ORAL | Status: DC
  Administered 2012-09-03 – 2012-09-04 (×2): 20 mg via ORAL
  Filled 2012-09-03 (×2): qty 1

## 2012-09-03 MED ORDER — LISINOPRIL 10 MG PO TABS
10.0000 mg | ORAL_TABLET | Freq: Every day | ORAL | Status: DC
  Administered 2012-09-03 – 2012-09-04 (×2): 10 mg via ORAL
  Filled 2012-09-03 (×2): qty 1

## 2012-09-03 MED ORDER — POTASSIUM CHLORIDE CRYS ER 20 MEQ PO TBCR
20.0000 meq | EXTENDED_RELEASE_TABLET | Freq: Every day | ORAL | Status: DC
  Filled 2012-09-03: qty 1

## 2012-09-03 NOTE — Progress Notes (Signed)
11 Days Post-Op  Subjective: Complains of some pain last PM, had a BM yesterday AM, pain lasted several hours, but his wife says he has allot of anxiety and stress, which may add to this occasional abdominal pain.  No nausea or vomiting with full liquids.Ambulating in halls.  Objective: Vital signs in last 24 hours: Temp:  [97.2 F (36.2 C)-99.1 F (37.3 C)] 99.1 F (37.3 C) (01/20 0714) Pulse Rate:  [68-110] 110  (01/20 0714) Resp:  [18] 18  (01/20 0714) BP: (138-169)/(61-71) 169/71 mmHg (01/20 0714) SpO2:  [90 %-98 %] 90 % (01/20 0714) Last BM Date: 09/02/12 Nothing PO recorded, no BM recorded, Diet: full liquids, TM 99.1, labs OK this AM,   Intake/Output from previous day: 01/19 0701 - 01/20 0700 In: -  Out: 1500 [Urine:1500] Intake/Output this shift: Total I/O In: -  Out: 450 [Urine:450]  General appearance: alert, cooperative, no distress and a bit anxious about the thought of going home. Resp: rales in bases, GI: soft, much less distended, incsions looks good, +BS, +BM  Lab Results:  No results found for this basename: WBC:2,HGB:2,HCT:2,PLT:2 in the last 72 hours  BMET  [facility] 09/03/12 0513 09/02/12 0536  NA 137 136  K 4.9 4.5  CL 98 101  CO2 32 27  GLUCOSE 129* 232*  BUN 4* 3*  CREATININE 1.19 1.05  CALCIUM 9.5 8.9   PT/INR No results found for this basename: LABPROT:2,INR:2 in the last 72 hours   Lab 08/31/12 0503  AST 32  ALT 20  ALKPHOS 82  BILITOT 0.4  PROT 6.0  ALBUMIN 2.5*     Lipase     Component Value Date/Time   LIPASE 14 08/21/2012 0406     Studies/Results: No results found.  Medications:    . bisacodyl  10 mg Rectal Daily  . cloNIDine  0.2 mg Transdermal Weekly  . feeding supplement  1 Container Oral TID BM  . furosemide  20 mg Intravenous Q12H  . heparin  5,000 Units Subcutaneous Q8H  . insulin aspart  0-15 Units Subcutaneous TID WC  . insulin aspart  0-5 Units Subcutaneous QHS  . insulin glargine  5 Units Subcutaneous  QHS  . lip balm  1 application Topical BID    Assessment/Plan Bowel obstruction. S/p Exploratory laparotomy with lysis of adhesions. [name], DO, 08/24/2012  1. Small bowel obstruction with history of prior ventral hernia repair and prostatectomy.  2. Adult diabetes type 2 HBA1C 16.3 on admit.  3.Diabetic Retinopathy/neuropathy  4.dyslipidemia  5.Visual impairment.  6. Hx of prostate CA  7. Abnormal EKG with T wave changes lateral leads, 1 degree AV block, repeated by Dr. Rockne Menghini and recommendation of ASA at D/C  8. hypokalemia 9. Post op pleural effusions noted on 1/13,and 1/16 films  Plan:  Advance to regular, low carb diet, shower, full staples today, and if OK aim for d/c home tomorrow. Change Lasix to po, change to daily PO KCL for now.  Await medicines help with d/c Medicines. He is on clonidine patch and lasix for now to control BP/fluid overload.  I/O shows a 2 liter decrease last couple days, I will get a weight so we can better estimate fluid balance.   LOS: 13 days    Eual Lindstrom 09/03/2012

## 2012-09-03 NOTE — Progress Notes (Signed)
Patient ID: [name], male   DOB: 05/17/1951, 62 y.o.   MRN: [id number]  [facility] PROGRESS NOTE  [name] [id number] DOB: 25-Aug-1950 DOA: 08/21/2012 PCP: No primary provider on file.  Brief narrative:  [name] is a 62 year old man with past medical history of diabetes, diabetic retinopathy, dyslipidemia, and prior prostatectomy as well as ventral hernia repair who was admitted by Dr. Lilyan Punt of the surgical service for treatment of a small bowel obstruction, confirmed by CT scans. The patient states that he normally manages his diabetes with 20 units of Lantus daily as well as oral hypoglycemics. He does not regularly check his blood glucoses. Has a glucose machine but no strips. He does not currently have a primary care doctor. Used to see Dr. Carlis Abbott. Ran out of all of his medications 2 months ago except for the Lantus, which he takes sporadically.   Principal Problem:       1. Small bowel obstruction  Management per primary team. Pt is status post ex lap with lysis of adhesion. CT with mild ileus, mgt per CCS, improving,  Advancing to regular diet today  2. Edema: Low dose lasix due to lower ext edema from iatrogenic fluid administration        Improving, agree with PO lasix today, upon DC home would recommend lasix 20mg  daily for 2-22more days only, no potassium supplementation needed        3. Abnormal EKG         LVH with repol abnormality, Chest pain resolved, add  Aspirin 81mg  daily at DC        4. Diabetes Mellitus:         Hbaic >16        CBG stable on current regimen of Lantus, SSI         DC home on Lantus 5Units QHS, Metformin 1000mg  daily and glipizide 5mg  daily        5. HTN: stable,  Continue clonidine patch , add lisinopril 10mg  daily and DC home on both these meds   DVT proph: lovenox   Procedures/Studies:   Dg Chest 2 View  08/25/2012  1. Small bilateral pleural effusions.  2. Tip of nasogastric tube is in the antral prepyloric region  of the stomach.   Dg Abd Acute W/chest  08/27/2012  1. Small bilateral pleural effusions and bibasilar atelectasis.  2. Persistent contrast in the colon.  3. No findings for small bowel obstruction or free air.   Ct Abdomen Pelvis W Contrast  08/21/2012  Small bowel obstruction to the level of the distal mid small bowel with abrupt transition point immediately adjacent to the umbilicus. This could be due to adhesion or an occult intrinsic or extrinsic mass lesion. Free fluid is present without loculation to suggest abscess formation, and no free air is identified. This does confer a risk of bowel ischemia. Status post radical prostatectomy without evidence for local recurrence or intra-abdominal/pelvic metastatic disease.   Dg Abd 2 Views  08/22/2012  Similar partial small bowel obstruction, without complicating free intraperitoneal air.   Dg Abd Portable 1v  08/21/2012  NG tube coils in the stomach. Continued high-grade small bowel obstruction pattern.  KUB 1/15 IMPRESSION: 1. Multiple new fluid levels within mildly dilated loops of small bowel is concerning for an ileus or early small bowel obstruction. 2. There is been some transit of contrast within the colon.     Code Status: Full  Family Communication: Pt at bedside  HPI/Subjective:  feels less bloated, less nausea, BM x2 yesterday, leg swelling coming down  Objective: Filed Vitals:   09/02/12 0600 09/02/12 1509 09/02/12 2237 09/03/12 0714  BP: 135/76 138/71 147/61 169/71  Pulse: 68 68 79 110  Temp: 98 F (36.7 C) 98.5 F (36.9 C) 97.2 F (36.2 C) 99.1 F (37.3 C)  TempSrc: Oral Oral Oral Oral  Resp: 18 18 18 18   Height:      Weight:      SpO2: 94% 98% 94% 90%    Intake/Output Summary (Last 24 hours) at 09/03/12 1021 Last data filed at 09/03/12 0727  Gross per 24 hour  Intake      0 ml  Output   1700 ml  Net  -1700 ml    Exam:   General:  Pt is alert, follows commands appropriately, not in acute  distress  Cardiovascular: Regular rate and rhythm, S1/S2, no murmurs, no rubs, no gallops  Respiratory: basilar crackles  Abdomen: Soft, mild tenderness,  Distended, BS present, no guarding  Extremities: 1 plus edema, pulses DP and PT palpable bilaterally  Neuro: Grossly nonfocal  Data Reviewed: Basic Metabolic Panel:  Lab [id number] 0513 09/02/12 0536 08/31/12 0503 08/30/12 0510 08/30/12 0505 08/29/12 0635  NA 137 136 138 -- 139 139  K 4.9 4.5 3.3* -- 3.0* 3.4*  CL 98 101 101 -- 102 105  CO2 32 27 27 -- 26 25  GLUCOSE 129* 232* 118* -- 102* 105*  BUN 4* 3* 4* -- 3* 4*  CREATININE 1.19 1.05 1.05 -- 0.95 0.96  CALCIUM 9.5 8.9 8.9 -- 8.4 8.0*  MG -- -- 1.9 1.7 -- 1.7  PHOS -- -- -- -- -- --   CBC:  Lab 08/31/12 0503 08/29/12 0635 08/28/12 0535  WBC 4.8 5.2 5.3  NEUTROABS -- -- --  HGB 11.6* 10.2* 10.6*  HCT 36.4* 32.3* 33.7*  MCV 65.5* 66.2* 66.5*  PLT 262 251 216   CBG:  Lab 09/03/12 0902 09/03/12 0350 09/03/12 0003 09/02/12 2028 09/02/12 1702  GLUCAP 121* 125* 132* 120* 131*   Scheduled Meds:    . bisacodyl  10 mg Rectal Daily  . cloNIDine  0.2 mg Transdermal Weekly  . feeding supplement  1 Container Oral TID BM  . furosemide  20 mg Oral Daily  . heparin  5,000 Units Subcutaneous Q8H  . insulin aspart  0-15 Units Subcutaneous TID WC  . insulin aspart  0-5 Units Subcutaneous QHS  . insulin glargine  5 Units Subcutaneous QHS  . lip balm  1 application Topical BID   Continuous Infusions:     Domenic Polite, MD  [facility] Pager [phone]  If 7PM-7AM, please contact night-coverage [email or web address] Password [email or web address] 09/03/2012, 10:21 AM   LOS: 13 days

## 2012-09-03 NOTE — Progress Notes (Signed)
Agree with above 

## 2012-09-04 DIAGNOSIS — K56609 Unspecified intestinal obstruction, unspecified as to partial versus complete obstruction: Secondary | ICD-10-CM

## 2012-09-04 LAB — BASIC METABOLIC PANEL
BUN: 9 mg/dL (ref 6–23)
CO2: 29 mEq/L (ref 19–32)
Chloride: 97 mEq/L (ref 96–112)
Creatinine, Ser: 1.18 mg/dL (ref 0.50–1.35)
GFR calc Af Amer: 74 mL/min — ABNORMAL LOW (ref >90)

## 2012-09-04 LAB — GLUCOSE, CAPILLARY

## 2012-09-04 MED ORDER — INSULIN GLARGINE 100 UNIT/ML ~~LOC~~ SOLN: 5.0000 [IU] | Freq: Every day | SUBCUTANEOUS | Status: DC

## 2012-09-04 MED ORDER — ASPIRIN 81 MG PO CHEW
81.0000 mg | CHEWABLE_TABLET | Freq: Every day | ORAL | Status: DC
  Administered 2012-09-04: 81 mg via ORAL
  Filled 2012-09-04: qty 1

## 2012-09-04 MED ORDER — FUROSEMIDE 20 MG PO TABS: 20.0000 mg | ORAL_TABLET | Freq: Every day | ORAL | Status: DC

## 2012-09-04 MED ORDER — OXYCODONE-ACETAMINOPHEN 5-325 MG PO TABS: 1.0000 | ORAL_TABLET | ORAL | Status: DC | PRN

## 2012-09-04 MED ORDER — GLIPIZIDE 5 MG PO TABS
5.0000 mg | ORAL_TABLET | Freq: Every day | ORAL | Status: DC
  Administered 2012-09-04: 5 mg via ORAL
  Filled 2012-09-04 (×2): qty 1

## 2012-09-04 MED ORDER — ASPIRIN 81 MG PO CHEW: 81.0000 mg | CHEWABLE_TABLET | Freq: Every day | ORAL | Status: DC

## 2012-09-04 MED ORDER — METFORMIN HCL 500 MG PO TABS
1000.0000 mg | ORAL_TABLET | Freq: Every day | ORAL | Status: DC
  Filled 2012-09-04: qty 2

## 2012-09-04 MED ORDER — GLIPIZIDE 5 MG PO TABS: 5.0000 mg | ORAL_TABLET | Freq: Every day | ORAL | Status: DC

## 2012-09-04 MED ORDER — CLONIDINE HCL 0.2 MG/24HR TD PTWK: 1.0000 | MEDICATED_PATCH | TRANSDERMAL | Status: DC

## 2012-09-04 MED ORDER — LISINOPRIL 10 MG PO TABS: 10.0000 mg | ORAL_TABLET | Freq: Every day | ORAL | Status: DC

## 2012-09-04 NOTE — Progress Notes (Signed)
Pt seen and examined, doing well Exam unchanged Got FMLA papers DC plans noted, nothing else to add  Domenic Polite, MD

## 2012-09-04 NOTE — Discharge Summary (Signed)
Agree with above 

## 2012-09-04 NOTE — Progress Notes (Signed)
Agree with above 

## 2012-09-04 NOTE — Progress Notes (Signed)
12 Days Post-Op  Subjective: Doing well this AM, tolerating PO's, +BM.  Objective: Vital signs in last 24 hours: Temp:  [98 F (36.7 C)-98.7 F (37.1 C)] 98.1 F (36.7 C) (01/21 0520) Pulse Rate:  [70-80] 70  (01/21 0520) Resp:  [18] 18  (01/21 0520) BP: (125-155)/(54-67) 125/54 mmHg (01/21 0520) SpO2:  [95 %-98 %] 95 % (01/21 0520) Weight:  [175 lb 11.3 oz (79.7 kg)-177 lb 7.5 oz (80.5 kg)] 175 lb 11.3 oz (79.7 kg) (01/21 0520) Last BM Date: 09/02/12 Afebrile, VSS,Glucose is in a good range,  Intake/Output from previous day: 01/20 0701 - 01/21 0700 In: -  Out: 450 [Urine:450] Intake/Output this shift:    General appearance: alert, cooperative and no distress Resp: still a few rales in bases, slight decrease in bases. GI: soft, non-tender; bowel sounds normal; no masses,  no organomegaly and Incision looks good, I have taken staples out and steristrips applied.  Lab Results:  No results found for this basename: WBC:2,HGB:2,HCT:2,PLT:2 in the last 72 hours  BMET  Basename 09/04/12 0450 09/03/12 0513  NA 135 137  K 4.7 4.9  CL 97 98  CO2 29 32  GLUCOSE 181* 129*  BUN 9 4*  CREATININE 1.18 1.19  CALCIUM 9.3 9.5   PT/INR No results found for this basename: LABPROT:2,INR:2 in the last 72 hours   Lab 08/31/12 0503  AST 32  ALT 20  ALKPHOS 82  BILITOT 0.4  PROT 6.0  ALBUMIN 2.5*     Lipase     Component Value Date/Time   LIPASE 14 08/21/2012 0406     Studies/Results: No results found.  Medications:    . bisacodyl  10 mg Rectal Daily  . cloNIDine  0.2 mg Transdermal Weekly  . feeding supplement  1 Container Oral TID BM  . furosemide  20 mg Oral Daily  . heparin  5,000 Units Subcutaneous Q8H  . insulin aspart  0-15 Units Subcutaneous TID WC  . insulin aspart  0-5 Units Subcutaneous QHS  . insulin glargine  5 Units Subcutaneous QHS  . lip balm  1 application Topical BID  . lisinopril  10 mg Oral Daily    Assessment/Plan Bowel obstruction. S/p  Exploratory laparotomy with lysis of adhesions. [name], DO, 08/24/2012  1. Small bowel obstruction with history of prior ventral hernia repair and prostatectomy.  2. Adult diabetes type 2 HBA1C 16.3 on admit.  3.Diabetic Retinopathy/neuropathy  4.dyslipidemia  5.Visual impairment.  6. Hx of prostate CA  7. Abnormal EKG with T wave changes lateral leads, 1 degree AV block, repeated by Dr. Rockne Menghini and recommendation of ASA at D/C  8. hypokalemia  9. Post op pleural effusions noted on 1/13,and 1/16 films  Plan:  Home today, with follow up by Primary care for his medical issues, appreciate Dr. Broadus John and her recommendations, Dr. Lilyan Punt in 2 weeks our office.  LOS: 14 days    Kaileen Bronkema 09/04/2012

## 2012-09-04 NOTE — Discharge Summary (Signed)
Physician Discharge Summary  Patient ID: [name] MRN: [id number] DOB/AGE: 1950-12-12 62 y.o.  Admit date: 08/21/2012 Discharge date: 09/04/2012  Admission Diagnoses: 1. Small bowel obstruction with history of prior ventral hernia repair and prostatectomy.  2. Adult diabetes type 2  3.Diabetic Retinopathy/neuropathy  4.dyslipidemia  5.Visual impairment.  6. Hx of prostate CA   Discharge Diagnoses: Bowel obstruction. S/p Exploratory laparotomy with lysis of adhesions. [name], DO, 08/24/2012  1. Small bowel obstruction with history of prior ventral hernia repair and prostatectomy.  2. Adult diabetes type 2 HBA1C 16.3 on admit.  3.Diabetic Retinopathy/neuropathy  4.dyslipidemia  5.Visual impairment.  6. Hx of prostate CA  7. Abnormal EKG with T wave changes lateral leads, 1 degree AV block, repeated by Dr. Rockne Menghini and recommendation of ASA at D/C  8. hypokalemia  9. Post op pleural effusions noted on 1/13,and 1/16 films  Principal Problem:  *Small bowel obstruction Active Problems:  DIABETES MELLITUS, TYPE II, UNCONTROLLED  Ileus following gastrointestinal surgery  HYPERTENSION  Hypokalemia  Fluid overload  HYPERLIPIDEMIA, MIXED  Abnormal EKG  Chest pain   PROCEDURES: S/p Exploratory laparotomy with lysis of adhesions. [name], DO, 08/24/2012    Hospital Course: Patient is a 62 year old gentleman with a history of prior prostatectomy and ventral hernia repair. He has never had any problems until yesterday after lunch he ate some popcorn then developed acute pain in the midabdomen. Pain became severe and was unrelieved by Motrin. He ultimately went home but continued to have pain throughout the rest of the day until the early morning hours when he was ultimately brought to the emergency room and [facility]. He reports having multiple episodes of nausea and vomiting; 3 times at home, 3 times in route to the hospital, and at least once since he's been in the  hospital early this morning. He continues to have pain symptoms improved with medication. Then it returns. He had some minimal relief with vomiting but it did not last long. He also reports acid indigestion-like symptoms with vomiting. Workup in the ER at [facility] shows a sodium of 127, a potassium of 5.9, glucose of 405. Repeat study 2 hours later shows glucose 406, Na 130. WBC, H/H is normal. UA shows glucose, CT scan shows SBO mid small bowel with abrupt transition point adjacent to the umbilicus, witn concern for bowel ischemia. Film reviewed by Dr. Lilyan Punt and [facility], they see no free air or acute ischemia. We plan to admit and treat for SBO. We followed him for the next 3 days and he did not open up and was taken to the OR on 08/24/12.  He tolerated the procedure well.  Post op he was hemodynamically stable, but continued to have a prolonged ileus. Repeat CT scan on 08/30/12 showed some loops of small bowel dilatation, but contrast moved into the distal small bowel and cecum.  He had issues with fluid overload, and pleural effusions treated with lasix.  His diabetes and hypertension were managed by Medicine, Abnormal EKG was also addressed and he was placed on ASA at D/C.  His diet was advanced over the weekend.  By 09/04/12 he was on a Carb modified diet, with +BM, he was ambulating well.  His incision had healed and staples removed before discharge.  Discharge medicines were at Medicine's recommendation.  Pt knows he has 1 month of medicine and plans to follow up at a New Urgent care near his residence. He will return to our office to see  Dr. Lilyan Punt in 2 weeks.  Condition on d/c:  Improved.   Disposition: Home     Medication List     As of 09/04/2012 10:15 AM    STOP taking these medications         nabumetone 750 MG tablet   Commonly known as: RELAFEN      TAKE these medications         aspirin 81 MG chewable tablet   Chew 1 tablet (81 mg total) by mouth daily.      cloNIDine  0.2 mg/24hr patch   Commonly known as: CATAPRES - Dosed in mg/24 hr   Place 1 patch (0.2 mg total) onto the skin once a week.      furosemide 20 MG tablet   Commonly known as: LASIX   Take 1 tablet (20 mg total) by mouth daily.      gabapentin 300 MG capsule   Commonly known as: NEURONTIN   Take 300 mg by mouth 2 (two) times daily.      glipiZIDE 5 MG tablet   Commonly known as: GLUCOTROL   Take 1 tablet (5 mg total) by mouth daily before breakfast.      insulin glargine 100 UNIT/ML injection   Commonly known as: LANTUS   Inject 5 Units into the skin at bedtime.      lisinopril 10 MG tablet   Commonly known as: PRINIVIL,ZESTRIL   Take 1 tablet (10 mg total) by mouth daily.      metFORMIN 500 MG tablet   Commonly known as: GLUCOPHAGE   Take 1,000 mg by mouth daily.      oxyCODONE-acetaminophen 5-325 MG per tablet   Commonly known as: PERCOCET/ROXICET   Take 1-2 tablets by mouth every 4 (four) hours as needed.      simvastatin 80 MG tablet   Commonly known as: ZOCOR   Take 80 mg by mouth daily.           Follow-up Information    Follow up with LAYTON, [place], DO. Schedule an appointment as soon as possible for a visit in 2 weeks.   Contact information:   [address] [place] [address] [phone]       Follow up with Contact your new Primary care doctor this week and arrange follow up As Soon As Possible.. (You have a 1 month supply of your current medicines.)          Signed: Alailah Safley 09/04/2012, 10:15 AM

## 2012-09-04 NOTE — Progress Notes (Signed)
Patient discharged to home.  Reviewed discharge instructions with patient and wife.  IV removed by nursing tech earlier in the shift.  No further questions at this time.   All belongings with patient.  Patient escorted to lobby via wheelchair by nursing tech.  Will continue to monitor.

## 2012-09-10 ENCOUNTER — Telehealth (INDEPENDENT_AMBULATORY_CARE_PROVIDER_SITE_OTHER): Payer: Self-pay | Admitting: General Surgery

## 2012-09-10 NOTE — Telephone Encounter (Signed)
Patient had surgery on 08/23/2012 and states he has had a sore throat from his naso-gastric tube since surgery. He is a week since discharge and this is no better. Please advise if there is any medication that can help with the discomfort.

## 2012-09-10 NOTE — Telephone Encounter (Signed)
Patient called in because his throat is still sore from the surgery he had (due to breathing tubes). Advised patient to gargle with warm salt water and to drink plenty of water to help keep his sinus lubricated naturally. Advised patient not to eat anything that is very acidic in order to not irritate this throat.

## 2012-09-11 ENCOUNTER — Telehealth (INDEPENDENT_AMBULATORY_CARE_PROVIDER_SITE_OTHER): Payer: Self-pay | Admitting: General Surgery

## 2012-09-11 NOTE — Telephone Encounter (Signed)
Pt called to ask about best treatment for his sore throat, secondary to a tube down his throat in the hospital.  He is gargling with warm, salt water and using chloroseptic (spray and lozenges.)  Recommended sipping warm fluids (decaf tea or apple juice,) trying tsp of honey, and OK to try Delsym.  Needs time to heal.

## 2012-09-14 ENCOUNTER — Encounter (INDEPENDENT_AMBULATORY_CARE_PROVIDER_SITE_OTHER): Payer: Self-pay | Admitting: Surgery

## 2012-09-14 ENCOUNTER — Encounter (INDEPENDENT_AMBULATORY_CARE_PROVIDER_SITE_OTHER): Payer: Self-pay

## 2012-09-14 ENCOUNTER — Ambulatory Visit (INDEPENDENT_AMBULATORY_CARE_PROVIDER_SITE_OTHER): Payer: Medicare Other | Admitting: Surgery

## 2012-09-14 VITALS — BP 160/74 | HR 64 | Temp 97.7°F | Resp 16 | Ht 67.0 in

## 2012-09-14 DIAGNOSIS — J029 Acute pharyngitis, unspecified: Secondary | ICD-10-CM

## 2012-09-14 DIAGNOSIS — K56609 Unspecified intestinal obstruction, unspecified as to partial versus complete obstruction: Secondary | ICD-10-CM

## 2012-09-14 MED ORDER — DEXTROMETHORPHAN-GUAIFENESIN 10-100 MG/5ML PO LIQD: 5.0000 mL | ORAL | Status: DC | PRN

## 2012-09-14 MED ORDER — OXYCODONE HCL 5 MG PO TABS: 5.0000 mg | ORAL_TABLET | ORAL | Status: DC | PRN

## 2012-09-14 NOTE — Progress Notes (Signed)
The pt walked in and states he fell last night while on a treadmill and landed on another piece of equipment on his abdomen.  He is having pain at his incision.  He rates it at a 7.  He has been on Percocet.  I asked did he consider going to the ER due to the fall and he said no because he has a high tolerance for pain.  I looked at his incision and I don't see any sign of hematoma.    He hasn't been eating much so his bowels haven't moved today.  He states he has been coughing and has irritation to his throat from the NG tube.  I asked Dr Johney Maine if he is willing to see the pt and he will see him after he is finished with his other patients.  I advised the pt to wait for the appointment.  He went back to the waiting room.

## 2012-09-14 NOTE — Progress Notes (Signed)
Subjective:     Patient ID: [name], male   DOB: January 13, 1951, 62 y.o.   MRN: [id number]  HPI  [name]  06-May-1951 [id number]  Patient Care Team: Jonathon Resides, Student-RN as Registered Nurse  This patient is a 62 y.o.male who presents today for surgical evaluation at the request of self.   PROCEDURE 08/23/2012: Exploratory laparotomy with lysis of adhesions.   POSTOPERATIVE DIAGNOSIS: Bowel obstruction.  Reason for evaluation: Walk-in status post fall with abdominal pain.  Pleasant male status post urgent exploration and lysis of adhesions for bowel obstruction.  Prolonged hospital stay.  Gradually covered.  Eating well.  No nausea or vomiting.  Went home on diuretics.  Has been using a treadmill to walk.  He fell and tripped and landed on his right side.  Felt severe pain.  Walked into clinic this afternoon unannounced wishing to be seen.  No fevers or chills.  No lightheadedness or dizziness.  He was worried he may have injured something.  Also complaining of sore throat.  No productive cough.  Anxious a little bit.  Claims diabetes numbers are under good control with blood sugars around 130s.  Continues Catapres blood pressure medicine.  Was started on lisinopril on discharge.  Has not followed up with any new doctor since he was discharged home.  He continues his Lasix diuretics even though he was only told to do it for a few more days from discharge.  Patient Active Problem List  Diagnosis  . Sarcoidosis  . DIABETES MELLITUS, TYPE II, UNCONTROLLED  . DIABETIC  RETINOPATHY  . HYPERLIPIDEMIA, MIXED  . CERUMEN IMPACTION, BILATERAL  . HYPERTENSION  . Regional enteritis of small intestine  . ERECTILE DYSFUNCTION, ORGANIC  . Cellulitis and abscess of face  . URINARY INCONTINENCE, STRESS, MALE  . PROSTATE CANCER, HX OF  . Small bowel obstruction  . Abnormal EKG  . Chest pain  . Hypokalemia  . Fluid overload  . Ileus following gastrointestinal surgery    Past  Medical History  Diagnosis Date  . Diabetes mellitus without complication   . Cancer   . Prostate cancer   . Fluid overload 09/03/2012    Post op  . Ileus following gastrointestinal surgery 09/03/2012    Past Surgical History  Procedure Date  . Hernia repair   . Prostate surgery   . Shoulder surgery   . Laparotomy 08/23/2012    Procedure: EXPLORATORY LAPAROTOMY;  Surgeon: [name], DO;  Location: [facility];  Service: General;  Laterality: N/A;  exploratory laparotomy with lysis of adhesions  . Lysis of adhesion 08/23/2012    Procedure: LYSIS OF ADHESION;  Surgeon: [name], DO;  Location: [facility];  Service: General;;    History   Social History  . Marital Status: Married    Spouse Name: N/A    Number of Children: N/A  . Years of Education: N/A   Occupational History  . Not on file.   Social History Main Topics  . Smoking status: Never Smoker   . Smokeless tobacco: Never Used  . Alcohol Use: No  . Drug Use:   . Sexually Active: No   Other Topics Concern  . Not on file   Social History Narrative  . No narrative on file    Family History  Problem Relation Age of Onset  . Diabetes Mellitus II Mother     Current Outpatient Prescriptions  Medication Sig Dispense Refill  . aspirin 81 MG chewable tablet Chew 1 tablet (  81 mg total) by mouth daily.      . cloNIDine (CATAPRES - DOSED IN MG/24 HR) 0.2 mg/24hr patch Place 1 patch (0.2 mg total) onto the skin once a week.  30 patch  0  . furosemide (LASIX) 20 MG tablet Take 1 tablet (20 mg total) by mouth daily.  3 tablet  0  . gabapentin (NEURONTIN) 300 MG capsule Take 300 mg by mouth 2 (two) times daily.      [name] glipiZIDE (GLUCOTROL) 5 MG tablet Take 1 tablet (5 mg total) by mouth daily before breakfast.  30 tablet  0  . insulin glargine (LANTUS) 100 UNIT/ML injection Inject 5 Units into the skin at bedtime.  10 mL  1  . lisinopril (PRINIVIL,ZESTRIL) 10 MG tablet Take 1 tablet (10 mg total) by mouth daily.  30 tablet  0    . metFORMIN (GLUCOPHAGE) 500 MG tablet Take 1,000 mg by mouth daily.      [name] oxyCODONE-acetaminophen (PERCOCET/ROXICET) 5-325 MG per tablet Take 1-2 tablets by mouth every 4 (four) hours as needed.  40 tablet  0  . simvastatin (ZOCOR) 80 MG tablet Take 80 mg by mouth daily.         Allergies  Allergen Reactions  . Naproxen Anaphylaxis    REACTION: Throat swells and cannot breathe    BP 160/74  Pulse 64  Temp 97.7 F (36.5 C)  Resp 16  Ht 5\' 7"  (1.702 m)  Dg Chest 2 View  08/25/2012  *RADIOLOGY REPORT*  Clinical Data: Cough, fever and shortness of breath.  CHEST - 2 VIEW  Comparison: Chest x-ray 07/13/2010.  Findings: Nasogastric tube with tip in the antral prepyloric region of the stomach.  Lung volumes are normal.  Small bilateral pleural effusions.  No consolidative airspace disease.  Pulmonary vasculature is normal.  Heart size is upper limits of normal. Mediastinal contours are unremarkable.  IMPRESSION: 1.  Small bilateral pleural effusions. 2.  Tip of nasogastric tube is in the antral prepyloric region of the stomach.   Original Report Authenticated By: Vinnie Langton, M.D.    Ct Abdomen Pelvis W Contrast  08/30/2012  *RADIOLOGY REPORT*  Clinical Data: Postop small bowel obstruction with lysis of adhesions.  Diffuse abdominal pain, bloating.  CT ABDOMEN AND PELVIS WITH CONTRAST  Technique:  Multidetector CT imaging of the abdomen and pelvis was performed following the standard protocol during bolus administration of intravenous contrast.  Contrast: 1103mL OMNIPAQUE IOHEXOL 300 MG/ML  SOLN  Comparison: Plain films 08/29/2012  Findings: There are small bilateral pleural effusions, right greater than left.  Compressive atelectasis in the lower lobes, also greater on the right.  Heart is normal size.  Mild prominence of the mid abdominal small bowel loops.  No well- defined transition to normal caliber small bowel.  Air noted throughout the colon.  The contrast material has passed into the  distal small bowel and cecum.  Small amount of free fluid around the liver and in the pelvis. Liver, spleen, pancreas, adrenals and kidneys are normal.  Urinary bladder is decompressed, grossly unremarkable.  Surgical clips in the pelvis from prior prostatectomy.  Aorta is normal caliber.  No free air or adenopathy.  No acute bony abnormality.  IMPRESSION: Continued mild prominence of small bowel loops with gradual transition to decompressed small bowel.  Contrast has passed into the distal small bowel and cecum.  Bowel gas pattern is nonspecific, possibly mild ileus.  Small amount of free fluid in the abdomen and pelvis.  Small  bilateral effusions, right greater than left.   Original Report Authenticated By: Rolm Baptise, M.D.    Ct Abdomen Pelvis W Contrast  08/21/2012  *RADIOLOGY REPORT*  Clinical Data: Abdominal distention and pain, nausea and vomiting. History of prostate cancer status post radical prostatectomy and hernia repair.  CT ABDOMEN AND PELVIS WITH CONTRAST  Technique:  Multidetector CT imaging of the abdomen and pelvis was performed following the standard protocol during bolus administration of intravenous contrast.  Contrast: 165mL OMNIPAQUE IOHEXOL 300 MG/ML  SOLN  Comparison: 12/29/2007  Findings: Minimal dependent bibasilar scarring or atelectasis again noted.  5 mm nodule abutting the left hemidiaphragm image 10 is stable.  There is a mid small bowel dilatation to the level of an abrupt transition point subjacent to the umbilicus, image 51 series 2. Distal small bowel and colonic decompression noted.  Trace fluid tracks along the right pericolic gutter.  No extrinsic mass lesion is identified.  No mass within the bowel is identified.  No free air.  Liver, gallbladder, adrenal glands, left kidney, spleen, and pancreas are unremarkable.  1.2 cm right upper renal pole cortical cyst incidentally noted.  The appendix is normal.  Bladder is normal.  Clips along the pelvic sidewalls are compatible  with prior prostatectomy.  No pelvic mass is identified.  Trace free pelvic fluid is identified image 60.  No lymphadenopathy.  No lytic or sclerotic osseous lesion or acute osseous abnormality. Lumbar spine degenerative changes are again noted.  Mild right hip degenerative change.  IMPRESSION: Small bowel obstruction to the level of the distal mid small bowel with abrupt transition point immediately adjacent to the umbilicus. This could be due to adhesion or an occult intrinsic or extrinsic mass lesion.  Free fluid is present without loculation to suggest abscess formation, and no free air is identified.  This does confer a risk of bowel ischemia.  Status post radical prostatectomy without evidence for local recurrence or intra-abdominal/pelvic metastatic disease.   Original Report Authenticated By: Conchita Paris, M.D.    Dg Abd 2 Views  08/29/2012  *RADIOLOGY REPORT*  Clinical Data: Small bowel obstruction.  Increasing discomfort.  ABDOMEN - 2 VIEW  Comparison: Acute abdominal series 08/28/2011.  Findings: Increasing gaseous distention of small bowel is evident. Multiple fluid levels are present.  Staples are again noted. Contrast in the ascending colon has progressed in the colon.  The colon is relatively collapsed.  IMPRESSION:  1.  Multiple new fluid levels within mildly dilated loops of small bowel is concerning for an ileus or early small bowel obstruction. 2.  There is been some transit of contrast within the colon.   Original Report Authenticated By: San Morelle, M.D.    Dg Abd 2 Views  08/23/2012  *RADIOLOGY REPORT*  Clinical Data: Bowel obstruction, history diabetes, prostate cancer  ABDOMEN - 2 VIEW  Comparison: 08/22/2012  Findings: Nasogastric tube coiled in stomach. Retained contrast right colon. Persistent dilatation of small bowel loops in mid abdomen compatible with small bowel obstruction. Question mild bowel wall thickening of a mid abdominal small bowel loop. Bilateral pelvic  surgical clips. No definite urinary tract calcification or acute osseous findings. Degenerative disc and facet disease changes lower lumbar spine.  IMPRESSION: Persistent small bowel dilatation compatible with obstruction. Question mild bowel wall thickening of a mid abdominal small bowel loop.   Original Report Authenticated By: Lavonia Dana, M.D.    Dg Abd 2 Views  08/22/2012  *RADIOLOGY REPORT*  Clinical Data: Follow up of small bowel obstruction.  Abdominal pain.  ABDOMEN - 2 VIEW  Comparison: 1 day prior and is CT of 08/22/2011.  Findings: Upright and supine views.  The upright view demonstrates nasogastric tube to be looped in the stomach with the tip at the gastric fundus.  Numerous small bowel air fluid levels. No free intraperitoneal air. There may be right base air space disease, suboptimally evaluated.  Supine view demonstrates persistent small bowel distention at up to 4.5 cm within the left side abdomen.  Unchanged.  There is contrast within the colon, which is normal in caliber.  Distal gas identified.  Surgical clips in the pelvis.  No pneumatosis.  IMPRESSION: Similar partial small bowel obstruction, without complicating free intraperitoneal air.   Original Report Authenticated By: Abigail Miyamoto, M.D.    Dg Abd Acute W/chest  08/27/2012  *RADIOLOGY REPORT*  Clinical Data: Postop hernia repair.  Ileus.  ACUTE ABDOMEN SERIES (ABDOMEN 2 VIEW & CHEST 1 VIEW)  Comparison: Chest x-ray 08/25/2012 and abdominal film 08/24/2012.  Findings: The upright chest x-ray demonstrates low lung volumes with vascular crowding and streaky atelectasis.  There are persistent small bilateral pleural effusions.  Two views of the abdomen demonstrate the NG tube tip in the antropyloric region of the stomach.  There is persistent contrast in the colon.  The appendix is visualized.  No distended small bowel loops to suggest obstruction.  No free air.  The soft tissue shadows are maintained.  Surgical skin staples are noted.   IMPRESSION:  1.  Small bilateral pleural effusions and bibasilar atelectasis. 2.  Persistent contrast in the colon. 3.  No findings for small bowel obstruction or free air.   Original Report Authenticated By: Marijo Sanes, M.D.    Dg Abd Portable 1v  08/24/2012  *RADIOLOGY REPORT*  Clinical Data: NG placement  PORTABLE ABDOMEN - 1 VIEW  Comparison: 08/23/2012  Findings: NG tube tip is in the region of the antrum of the stomach.  Improvement in dilated bowel which may represent resolving ileus. Contrast remains in the right colon. Interval laparotomy.  IMPRESSION: NG tube in the gastric antrum.   Original Report Authenticated By: Carl Best, M.D.    Dg Abd Portable 1v  08/21/2012  *RADIOLOGY REPORT*  Clinical Data: The NG tube placement.  PORTABLE ABDOMEN - 1 VIEW  Comparison: 08/21/2012  Findings: NG tube coils in the stomach.  Continued small bowel distention with gas and contrast material, not significantly changed since prior CT.  Small amount of colonic gas noted.  No free air organomegaly.  IMPRESSION: NG tube coils in the stomach.  Continued high-grade small bowel obstruction pattern.   Original Report Authenticated By: Rolm Baptise, M.D.      Patient Active Problem List  Diagnosis  . Sarcoidosis  . DIABETES MELLITUS, TYPE II, UNCONTROLLED  . DIABETIC  RETINOPATHY  . HYPERLIPIDEMIA, MIXED  . CERUMEN IMPACTION, BILATERAL  . HYPERTENSION  . Regional enteritis of small intestine  . ERECTILE DYSFUNCTION, ORGANIC  . Cellulitis and abscess of face  . URINARY INCONTINENCE, STRESS, MALE  . PROSTATE CANCER, HX OF  . Small bowel obstruction  . Abnormal EKG  . Chest pain  . Hypokalemia  . Fluid overload  . Ileus following gastrointestinal surgery    Past Medical History  Diagnosis Date  . Diabetes mellitus without complication   . Cancer   . Prostate cancer   . Fluid overload 09/03/2012    Post op  . Ileus following gastrointestinal surgery 09/03/2012    Past Surgical History  Procedure Date  . Hernia repair   . Prostate surgery   . Shoulder surgery   . Laparotomy 08/23/2012    Procedure: EXPLORATORY LAPAROTOMY;  Surgeon: [name], DO;  Location: [facility];  Service: General;  Laterality: N/A;  exploratory laparotomy with lysis of adhesions  . Lysis of adhesion 08/23/2012    Procedure: LYSIS OF ADHESION;  Surgeon: [name], DO;  Location: [facility];  Service: General;;    History   Social History  . Marital Status: Married    Spouse Name: N/A    Number of Children: N/A  . Years of Education: N/A   Occupational History  . Not on file.   Social History Main Topics  . Smoking status: Never Smoker   . Smokeless tobacco: Never Used  . Alcohol Use: No  . Drug Use:   . Sexually Active: No   Other Topics Concern  . Not on file   Social History Narrative  . No narrative on file    Family History  Problem Relation Age of Onset  . Diabetes Mellitus II Mother     Current Outpatient Prescriptions  Medication Sig Dispense Refill  . aspirin 81 MG chewable tablet Chew 1 tablet (81 mg total) by mouth daily.      . cloNIDine (CATAPRES - DOSED IN MG/24 HR) 0.2 mg/24hr patch Place 1 patch (0.2 mg total) onto the skin once a week.  30 patch  0  . furosemide (LASIX) 20 MG tablet Take 1 tablet (20 mg total) by mouth daily.  3 tablet  0  . gabapentin (NEURONTIN) 300 MG capsule Take 300 mg by mouth 2 (two) times daily.      [name] glipiZIDE (GLUCOTROL) 5 MG tablet Take 1 tablet (5 mg total) by mouth daily before breakfast.  30 tablet  0  . insulin glargine (LANTUS) 100 UNIT/ML injection Inject 5 Units into the skin at bedtime.  10 mL  1  . lisinopril (PRINIVIL,ZESTRIL) 10 MG tablet Take 1 tablet (10 mg total) by mouth daily.  30 tablet  0  . metFORMIN (GLUCOPHAGE) 500 MG tablet Take 1,000 mg by mouth daily.      [name] oxyCODONE-acetaminophen (PERCOCET/ROXICET) 5-325 MG per tablet Take 1-2 tablets by mouth every 4 (four) hours as needed.  40 tablet  0  . simvastatin  (ZOCOR) 80 MG tablet Take 80 mg by mouth daily.         Allergies  Allergen Reactions  . Naproxen Anaphylaxis    REACTION: Throat swells and cannot breathe    BP 160/74  Pulse 64  Temp 97.7 F (36.5 C)  Resp 16  Ht 5\' 7"  (1.702 m)  Dg Chest 2 View  08/25/2012  *RADIOLOGY REPORT*  Clinical Data: Cough, fever and shortness of breath.  CHEST - 2 VIEW  Comparison: Chest x-ray 07/13/2010.  Findings: Nasogastric tube with tip in the antral prepyloric region of the stomach.  Lung volumes are normal.  Small bilateral pleural effusions.  No consolidative airspace disease.  Pulmonary vasculature is normal.  Heart size is upper limits of normal. Mediastinal contours are unremarkable.  IMPRESSION: 1.  Small bilateral pleural effusions. 2.  Tip of nasogastric tube is in the antral prepyloric region of the stomach.   Original Report Authenticated By: Vinnie Langton, M.D.    Ct Abdomen Pelvis W Contrast  08/30/2012  *RADIOLOGY REPORT*  Clinical Data: Postop small bowel obstruction with lysis of adhesions.  Diffuse abdominal pain, bloating.  CT ABDOMEN AND PELVIS WITH CONTRAST  Technique:  Multidetector CT imaging of the abdomen and pelvis was performed following the standard protocol during bolus administration of intravenous contrast.  Contrast: 167mL OMNIPAQUE IOHEXOL 300 MG/ML  SOLN  Comparison: Plain films 08/29/2012  Findings: There are small bilateral pleural effusions, right greater than left.  Compressive atelectasis in the lower lobes, also greater on the right.  Heart is normal size.  Mild prominence of the mid abdominal small bowel loops.  No well- defined transition to normal caliber small bowel.  Air noted throughout the colon.  The contrast material has passed into the distal small bowel and cecum.  Small amount of free fluid around the liver and in the pelvis. Liver, spleen, pancreas, adrenals and kidneys are normal.  Urinary bladder is decompressed, grossly unremarkable.  Surgical clips in the  pelvis from prior prostatectomy.  Aorta is normal caliber.  No free air or adenopathy.  No acute bony abnormality.  IMPRESSION: Continued mild prominence of small bowel loops with gradual transition to decompressed small bowel.  Contrast has passed into the distal small bowel and cecum.  Bowel gas pattern is nonspecific, possibly mild ileus.  Small amount of free fluid in the abdomen and pelvis.  Small bilateral effusions, right greater than left.   Original Report Authenticated By: Rolm Baptise, M.D.    Ct Abdomen Pelvis W Contrast  08/21/2012  *RADIOLOGY REPORT*  Clinical Data: Abdominal distention and pain, nausea and vomiting. History of prostate cancer status post radical prostatectomy and hernia repair.  CT ABDOMEN AND PELVIS WITH CONTRAST  Technique:  Multidetector CT imaging of the abdomen and pelvis was performed following the standard protocol during bolus administration of intravenous contrast.  Contrast: 161mL OMNIPAQUE IOHEXOL 300 MG/ML  SOLN  Comparison: 12/29/2007  Findings: Minimal dependent bibasilar scarring or atelectasis again noted.  5 mm nodule abutting the left hemidiaphragm image 10 is stable.  There is a mid small bowel dilatation to the level of an abrupt transition point subjacent to the umbilicus, image 51 series 2. Distal small bowel and colonic decompression noted.  Trace fluid tracks along the right pericolic gutter.  No extrinsic mass lesion is identified.  No mass within the bowel is identified.  No free air.  Liver, gallbladder, adrenal glands, left kidney, spleen, and pancreas are unremarkable.  1.2 cm right upper renal pole cortical cyst incidentally noted.  The appendix is normal.  Bladder is normal.  Clips along the pelvic sidewalls are compatible with prior prostatectomy.  No pelvic mass is identified.  Trace free pelvic fluid is identified image 60.  No lymphadenopathy.  No lytic or sclerotic osseous lesion or acute osseous abnormality. Lumbar spine degenerative changes are  again noted.  Mild right hip degenerative change.  IMPRESSION: Small bowel obstruction to the level of the distal mid small bowel with abrupt transition point immediately adjacent to the umbilicus. This could be due to adhesion or an occult intrinsic or extrinsic mass lesion.  Free fluid is present without loculation to suggest abscess formation, and no free air is identified.  This does confer a risk of bowel ischemia.  Status post radical prostatectomy without evidence for local recurrence or intra-abdominal/pelvic metastatic disease.   Original Report Authenticated By: Conchita Paris, M.D.    Dg Abd 2 Views  08/29/2012  *RADIOLOGY REPORT*  Clinical Data: Small bowel obstruction.  Increasing discomfort.  ABDOMEN - 2 VIEW  Comparison: Acute abdominal series 08/28/2011.  Findings: Increasing gaseous distention of small bowel is evident. Multiple fluid levels are present.  Staples  are again noted. Contrast in the ascending colon has progressed in the colon.  The colon is relatively collapsed.  IMPRESSION:  1.  Multiple new fluid levels within mildly dilated loops of small bowel is concerning for an ileus or early small bowel obstruction. 2.  There is been some transit of contrast within the colon.   Original Report Authenticated By: San Morelle, M.D.    Dg Abd 2 Views  08/23/2012  *RADIOLOGY REPORT*  Clinical Data: Bowel obstruction, history diabetes, prostate cancer  ABDOMEN - 2 VIEW  Comparison: 08/22/2012  Findings: Nasogastric tube coiled in stomach. Retained contrast right colon. Persistent dilatation of small bowel loops in mid abdomen compatible with small bowel obstruction. Question mild bowel wall thickening of a mid abdominal small bowel loop. Bilateral pelvic surgical clips. No definite urinary tract calcification or acute osseous findings. Degenerative disc and facet disease changes lower lumbar spine.  IMPRESSION: Persistent small bowel dilatation compatible with obstruction. Question mild  bowel wall thickening of a mid abdominal small bowel loop.   Original Report Authenticated By: Lavonia Dana, M.D.    Dg Abd 2 Views  08/22/2012  *RADIOLOGY REPORT*  Clinical Data: Follow up of small bowel obstruction.  Abdominal pain.  ABDOMEN - 2 VIEW  Comparison: 1 day prior and is CT of 08/22/2011.  Findings: Upright and supine views.  The upright view demonstrates nasogastric tube to be looped in the stomach with the tip at the gastric fundus.  Numerous small bowel air fluid levels. No free intraperitoneal air. There may be right base air space disease, suboptimally evaluated.  Supine view demonstrates persistent small bowel distention at up to 4.5 cm within the left side abdomen.  Unchanged.  There is contrast within the colon, which is normal in caliber.  Distal gas identified.  Surgical clips in the pelvis.  No pneumatosis.  IMPRESSION: Similar partial small bowel obstruction, without complicating free intraperitoneal air.   Original Report Authenticated By: Abigail Miyamoto, M.D.    Dg Abd Acute W/chest  08/27/2012  *RADIOLOGY REPORT*  Clinical Data: Postop hernia repair.  Ileus.  ACUTE ABDOMEN SERIES (ABDOMEN 2 VIEW & CHEST 1 VIEW)  Comparison: Chest x-ray 08/25/2012 and abdominal film 08/24/2012.  Findings: The upright chest x-ray demonstrates low lung volumes with vascular crowding and streaky atelectasis.  There are persistent small bilateral pleural effusions.  Two views of the abdomen demonstrate the NG tube tip in the antropyloric region of the stomach.  There is persistent contrast in the colon.  The appendix is visualized.  No distended small bowel loops to suggest obstruction.  No free air.  The soft tissue shadows are maintained.  Surgical skin staples are noted.  IMPRESSION:  1.  Small bilateral pleural effusions and bibasilar atelectasis. 2.  Persistent contrast in the colon. 3.  No findings for small bowel obstruction or free air.   Original Report Authenticated By: Marijo Sanes, M.D.    Dg Abd  Portable 1v  08/24/2012  *RADIOLOGY REPORT*  Clinical Data: NG placement  PORTABLE ABDOMEN - 1 VIEW  Comparison: 08/23/2012  Findings: NG tube tip is in the region of the antrum of the stomach.  Improvement in dilated bowel which may represent resolving ileus. Contrast remains in the right colon. Interval laparotomy.  IMPRESSION: NG tube in the gastric antrum.   Original Report Authenticated By: Carl Best, M.D.    Dg Abd Portable 1v  08/21/2012  *RADIOLOGY REPORT*  Clinical Data: The NG tube placement.  PORTABLE ABDOMEN - 1 VIEW  Comparison: 08/21/2012  Findings: NG tube coils in the stomach.  Continued small bowel distention with gas and contrast material, not significantly changed since prior CT.  Small amount of colonic gas noted.  No free air organomegaly.  IMPRESSION: NG tube coils in the stomach.  Continued high-grade small bowel obstruction pattern.   Original Report Authenticated By: Rolm Baptise, M.D.      Review of Systems  Constitutional: Negative for fever, chills and diaphoresis.  HENT: Positive for sore throat. Negative for trouble swallowing and neck pain.   Eyes: Negative for photophobia and visual disturbance.  Respiratory: Positive for cough. Negative for choking and shortness of breath.        ?PND/orthopnea  Cardiovascular: Negative for chest pain and palpitations.  Gastrointestinal: Positive for abdominal pain. Negative for nausea, vomiting, diarrhea, constipation, blood in stool, abdominal distention, anal bleeding and rectal pain.  Genitourinary: Positive for flank pain. Negative for dysuria, urgency, enuresis, difficulty urinating and testicular pain.  Musculoskeletal: Positive for myalgias. Negative for arthralgias and gait problem.  Skin: Negative for color change and rash.  Neurological: Negative for dizziness, speech difficulty, weakness and numbness.  Hematological: Negative for adenopathy.  Psychiatric/Behavioral: Negative for hallucinations, confusion and  agitation.       Objective:   Physical Exam  Constitutional: He is oriented to person, place, and time. He appears well-developed and well-nourished. No distress.  HENT:  Head: Normocephalic.  Mouth/Throat: Uvula is midline and oropharynx is clear and moist. Mucous membranes are not pale, not dry and not cyanotic. No oral lesions. No uvula swelling. No oropharyngeal exudate, posterior oropharyngeal edema, posterior oropharyngeal erythema or tonsillar abscesses.       No stridor or hoarseness.  No conversational dyspnea.  Eyes: Conjunctivae normal and EOM are normal. Pupils are equal, round, and reactive to light. No scleral icterus.  Neck: Normal range of motion. No tracheal deviation present.  Cardiovascular: Normal rate, normal heart sounds and intact distal pulses.   Pulmonary/Chest: Breath sounds normal. Bradypnea noted. Not tachypneic. No respiratory distress. He has no decreased breath sounds. He has no wheezes. He has no rhonchi. He has no rales. Chest wall is not dull to percussion. He exhibits no mass and no tenderness.  Abdominal: Soft. Bowel sounds are normal. He exhibits no distension. There is no hepatomegaly. There is tenderness in the right lower quadrant. There is no rigidity, no guarding, no CVA tenderness, no tenderness at McBurney's point and negative Murphy's sign. No hernia. Hernia confirmed negative in the right inguinal area and confirmed negative in the left inguinal area.         Incisions clean with normal healing ridges.  No hernias  Musculoskeletal: Normal range of motion. He exhibits no tenderness.  Neurological: He is alert and oriented to person, place, and time. No cranial nerve deficit. He exhibits normal muscle tone. Coordination normal.  Skin: Skin is warm and dry. No rash noted. He is not diaphoretic.  Psychiatric: He has a normal mood and affect. His behavior is normal.       Assessment:     Abdominal wall pain and discomfort after fall.  Most likely  musculoskeletal.  No evidence of wound breakdown/hematoma/trauma/hernia/significant injury.  Sore throat perhaps chronic pharyngitis.  Need for primary care physician.    Plan:     I recommend she get established with a primary care physician as an had been discussed while he was an inpatient.  He needs to get his diabetes, hypertension, etc. In order.  Anti-inflammatory regimen  for probable musculoskeletal strain.  I did review his oxycodone since he was almost out.  I see no strong evidence of any abscess or major concern with a sore throat.  Hard to getting angry history out of him.  Another possibility is maybe some mild orthopnea/PND.  He does not seem to have fluid overload.  It was recommended he not continue his diuretics indefinitely.  I would stop his Lasix for now to make sure he is not getting lightheaded or dizzy.  I recommended daily aiming his sore throat with some extra before than ice/sherbet.  If worse, followup primary care physician or ENT.  Increase activity as tolerated to regular activity.  Do not push through pain.  Diet as tolerated. Bowel regimen to avoid problems.  Return to clinic 2 weeks to see if he improves.   Instructions discussed.  Followup with primary care physician for other health issues as would normally be done.  Questions answered.  The patient expressed understanding and appreciation

## 2012-09-14 NOTE — Patient Instructions (Signed)
Please establish with a primary care physician to help with your high blood pressure and diabetes.  Stopped her Lasix for now.  Help control your sore throat better.  If it is not getting better, he may need to see a throat doctor (ENT):  Sore Throat Sore throats may be caused by bacteria and viruses. They may also be caused by:  Smoking.  Pollution.  Allergies. If a sore throat is due to strep infection (a bacterial infection), you may need:  A throat swab.  A culture test to verify the strep infection. You will need one of these:  An antibiotic shot.  Oral medicine for a full 10 days. Strep infection is very contagious. A doctor should check any close contacts who have a sore throat or fever. A sore throat caused by a virus infection will usually last only 3-4 days. Antibiotics will not treat a viral sore throat.  Infectious mononucleosis (a viral disease), however, can cause a sore throat that lasts for up to 3 weeks. Mononucleosis can be diagnosed with blood tests. You must have been sick for at least 1 week in order for the test to give accurate results. HOME CARE INSTRUCTIONS   To treat a sore throat, take mild pain medicine.  Increase your fluids.  Eat a soft diet.  Do not smoke.  Gargling with warm water or salt water (1 tsp. salt in 8 oz. water) can be helpful.  Try throat sprays or lozenges or sucking on hard candy to ease the symptoms. Call your doctor if your sore throat lasts longer than 1 week.  SEEK IMMEDIATE MEDICAL CARE IF:  You have difficulty breathing.  You have increased swelling in the throat.  You have pain so severe that you are unable to swallow fluids or your saliva.  You have a severe headache, a high fever, vomiting, or a red rash. Document Released: 09/08/2004 Document Revised: 10/24/2011 Document Reviewed: 07/19/2007 [facility] Patient Information 2013 [place].  Managing Pain  Pain after surgery or related to activity is often  due to strain/injury to muscle, tendon, nerves and/or incisions.  This pain is usually short-term and will improve in a few months.   Many people find it helpful to do the following things TOGETHER to help speed the process of healing and to get back to regular activity more quickly:  1. Avoid heavy physical activity a.  no lifting greater than 20 pounds b. Do not "push through" the pain.  Listen to your body and avoid positions and maneuvers than reproduce the pain c. Walking is okay as tolerated, but go slowly and stop when getting sore.  d. Remember: If it hurts to do it, then don't do it! 2. Take Anti-inflammatory medication  a. Take with food/snack around the clock for 1-2 weeks i. This helps the muscle and nerve tissues become less irritable and calm down faster b. Choose ONE of the following over-the-counter medications: i. Naproxen 220mg  tabs (ex. Aleve) 1-2 pills twice a day  ii. Ibuprofen 200mg  tabs (ex. Advil, Motrin) 3-4 pills with every meal and just before bedtime iii. Acetaminophen 500mg  tabs (Tylenol) 1-2 pills with every meal and just before bedtime 3. Use a Heating pad or Ice/Cold Pack a. 4-6 times a day b. May use warm bath/hottub  or showers 4. Try Gentle Massage and/or Stretching  a. at the area of pain many times a day b. stop if you feel pain - do not overdo it  Try these steps together to help  you body heal faster and avoid making things get worse.  Doing just one of these things may not be enough.    If you are not getting better after two weeks or are noticing you are getting worse, contact our office for further advice; we may need to re-evaluate you & see what other things we can do to help.

## 2012-09-16 ENCOUNTER — Emergency Department (HOSPITAL_COMMUNITY): Payer: Medicare Other

## 2012-09-16 ENCOUNTER — Emergency Department (HOSPITAL_COMMUNITY)
Admission: EM | Admit: 2012-09-16 | Discharge: 2012-09-16 | Disposition: A | Discharge status: Home / Self Care | Payer: Medicare Other | Attending: Emergency Medicine | Admitting: Emergency Medicine

## 2012-09-16 ENCOUNTER — Encounter (HOSPITAL_COMMUNITY): Payer: Self-pay | Admitting: Emergency Medicine

## 2012-09-16 DIAGNOSIS — E119 Type 2 diabetes mellitus without complications: Secondary | ICD-10-CM | POA: Insufficient documentation

## 2012-09-16 DIAGNOSIS — J029 Acute pharyngitis, unspecified: Secondary | ICD-10-CM

## 2012-09-16 DIAGNOSIS — Z7982 Long term (current) use of aspirin: Secondary | ICD-10-CM | POA: Insufficient documentation

## 2012-09-16 DIAGNOSIS — Z8719 Personal history of other diseases of the digestive system: Secondary | ICD-10-CM | POA: Insufficient documentation

## 2012-09-16 DIAGNOSIS — Z794 Long term (current) use of insulin: Secondary | ICD-10-CM | POA: Insufficient documentation

## 2012-09-16 DIAGNOSIS — Z8546 Personal history of malignant neoplasm of prostate: Secondary | ICD-10-CM | POA: Insufficient documentation

## 2012-09-16 DIAGNOSIS — Z79899 Other long term (current) drug therapy: Secondary | ICD-10-CM | POA: Insufficient documentation

## 2012-09-16 DIAGNOSIS — Y849 Medical procedure, unspecified as the cause of abnormal reaction of the patient, or of later complication, without mention of misadventure at the time of the procedure: Secondary | ICD-10-CM | POA: Insufficient documentation

## 2012-09-16 MED ORDER — OXYCODONE-ACETAMINOPHEN 5-325 MG PO TABS
2.0000 | ORAL_TABLET | Freq: Once | ORAL | Status: AC
  Administered 2012-09-16: 2 via ORAL
  Filled 2012-09-16: qty 2

## 2012-09-16 MED ORDER — HYDROCODONE-ACETAMINOPHEN 7.5-325 MG/15ML PO SOLN: 15.0000 mL | Freq: Three times a day (TID) | ORAL | Status: DC | PRN

## 2012-09-16 NOTE — ED Notes (Signed)
Pt alert, nad, arrives from home, c/o ? Sore throat, onset several weeks ago after inpt stay, states "i had that tube in my throat", resp even unlabored, skin pwd, no stridor noted

## 2012-09-16 NOTE — ED Provider Notes (Signed)
History     CSN: [id number]  Arrival date & time 09/16/12  0624   First MD Initiated Contact with Patient 09/16/12 [phone]      Chief Complaint  Patient presents with  . Sore Throat    (Consider location/radiation/quality/duration/timing/severity/associated sxs/prior treatment) HPI Comments: 62 year old male who presents approximately 2 weeks after being discharged from an inpatient admission and required surgery for a small bowel obstruction. He states that while he was admitted as an inpatient he had an NG tube that was placed to decompress his gastrointestinal tract. This was somewhat difficult and he had some complications per his report in that he required 2 or 3 Attempts to place the NG tube. When he had the tube removed he had residual pain in his larynx and neck which has been persistent for the last 2 weeks. It is not associated with fevers, shortness of breath, cough, swelling but he does have mild difficulty swallowing and has been resistant to eating chewy foods such as needs. He is eating successfully many soft foods. He denies any nausea or vomiting and has had minimal abdominal pain since being discharged. He has been having normal bowel movements and passing urine without difficulty. He does use Chloraseptic Spray successfully though it wears off quickly.  The history is provided by the patient and medical records.    Past Medical History  Diagnosis Date  . Diabetes mellitus without complication   . Cancer   . Prostate cancer   . Fluid overload 09/03/2012    Post op  . Ileus following gastrointestinal surgery 09/03/2012    Past Surgical History  Procedure Date  . Hernia repair   . Prostate surgery   . Shoulder surgery   . Laparotomy 08/23/2012    Procedure: EXPLORATORY LAPAROTOMY;  Surgeon: [name], DO;  Location: [facility];  Service: General;  Laterality: N/A;  exploratory laparotomy with lysis of adhesions  . Lysis of adhesion 08/23/2012    Procedure: LYSIS OF  ADHESION;  Surgeon: [name], DO;  Location: [facility];  Service: General;;    Family History  Problem Relation Age of Onset  . Diabetes Mellitus II Mother     History  Substance Use Topics  . Smoking status: Never Smoker   . Smokeless tobacco: Never Used  . Alcohol Use: No      Review of Systems  All other systems reviewed and are negative.    Allergies  Naproxen  Home Medications   Current Outpatient Rx  Name  Route  Sig  Dispense  Refill  . ASPIRIN 81 MG PO CHEW   Oral   Chew 1 tablet (81 mg total) by mouth daily.         [name] CLONIDINE HCL 0.2 MG/24HR TD PTWK   Transdermal   Place 1 patch (0.2 mg total) onto the skin once a week.   30 patch   0   . FUROSEMIDE 20 MG PO TABS   Oral   Take 1 tablet (20 mg total) by mouth daily.   3 tablet   0   . GLIPIZIDE 5 MG PO TABS   Oral   Take 1 tablet (5 mg total) by mouth daily before breakfast.   30 tablet   0   . INSULIN GLARGINE 100 UNIT/ML [place] SOLN   Subcutaneous   Inject 5 Units into the skin at bedtime.   10 mL   1   . OXYCODONE HCL 5 MG PO TABS   Oral   Take  1-2 tablets (5-10 mg total) by mouth every 4 (four) hours as needed for pain.   50 tablet   0   . SIMVASTATIN 80 MG PO TABS   Oral   Take 80 mg by mouth daily.         [name] GABAPENTIN 300 MG PO CAPS   Oral   Take 300 mg by mouth 2 (two) times daily.         [name] HYDROCODONE-ACETAMINOPHEN 7.5-325 MG/15ML PO SOLN   Oral   Take 15 mLs by mouth every 8 (eight) hours as needed for pain.   120 mL   0   . LISINOPRIL 10 MG PO TABS   Oral   Take 1 tablet (10 mg total) by mouth daily.   30 tablet   0   . METFORMIN HCL 500 MG PO TABS   Oral   Take 1,000 mg by mouth daily.           BP 119/64  Pulse 70  Temp 98.8 F (37.1 C) (Oral)  Resp 18  SpO2 100%  Physical Exam  Nursing note and vitals reviewed. Constitutional: He appears well-developed and well-nourished. No distress.  HENT:  Head: Normocephalic and atraumatic.   Mouth/Throat: Oropharynx is clear and moist. No oropharyngeal exudate.       Oropharynx is clear, uvula is on the long side, no swelling, no bleeding, no foreign bodies  Eyes: Conjunctivae normal and EOM are normal. Pupils are equal, round, and reactive to light. Right eye exhibits no discharge. Left eye exhibits no discharge. No scleral icterus.  Neck: Normal range of motion. Neck supple. No JVD present. No thyromegaly present.       Supple neck, normal tracheal movement with swallowing  Cardiovascular: Normal rate, regular rhythm, normal heart sounds and intact distal pulses.  Exam reveals no gallop and no friction rub.   No murmur heard. Pulmonary/Chest: Effort normal and breath sounds normal. No respiratory distress. He has no wheezes. He has no rales.  Abdominal: Soft. Bowel sounds are normal. He exhibits no distension and no mass. There is tenderness ( Mild abdominal tenderness, no guarding, no focal tenderness, no peritoneal signs).  Musculoskeletal: Normal range of motion. He exhibits no edema and no tenderness.  Lymphadenopathy:    He has no cervical adenopathy.  Neurological: He is alert. Coordination normal.  Skin: Skin is warm and dry. No rash noted. No erythema.  Psychiatric: He has a normal mood and affect. His behavior is normal.    ED Course  Procedures (including critical care time)  Labs Reviewed - No data to display Dg Neck Soft Tissue  09/16/2012  *RADIOLOGY REPORT*  Clinical Data: Difficulty breathing.  Sore throat.  NECK SOFT TISSUES - 1+ VIEW  Comparison: Cervical spine 10/24/2007  Findings: There appears to be prominence of the tonsillar/adenoidal tissues.  No prevertebral or submental soft tissue swelling.  No radiopaque foreign bodies.  Aryepiglottic folds appeared thickened suggesting inflammation.  Epiglottis is normal.  Degenerative changes in the cervical spine.  IMPRESSION: Prominent tonsillar/adenoidal tissues and aryepiglottic fold thickening suggest  inflammatory process.  No prevertebral or submental soft tissue thickening.   Original Report Authenticated By: Lucienne Capers, M.D.      1. Pharyngitis       MDM  The patient is very well-appearing with normal vital signs. I suspect he has residual inflammation in his larynx and posterior pharynx related to the NG tube placement. He will receive a lateral neck x-ray to rule out swelling  of his epiglottis but is not have any signs of significant abnormalities. His voice has not changed, he is not having trouble with his secretions or with swallowing soft foods. He has been given pain medication.   I have personally reviewed the x-ray and agree with the radiologist interpretation that there appears to be  inflammation in the larynx, the patient is not febrile, appears well, has an intact gag reflex, tolerating secretions and normal voice.  I've explained to the patient indications for return, he hasn't missed his understanding and he has an allergy to Naprosyn which is anaphylaxis that so that cannot take anti-inflammatories. He will be given liquid hydrocodone suspension and asked to followup with ENT on an outpatient basis or return if symptoms worsen    Johnna Acosta, MD 09/16/12 [phone]

## 2012-09-16 NOTE — ED Notes (Signed)
Patient transported to X-ray 

## 2012-09-16 NOTE — ED Notes (Signed)
Dr. Miller @ bedside.

## 2012-09-21 ENCOUNTER — Ambulatory Visit (INDEPENDENT_AMBULATORY_CARE_PROVIDER_SITE_OTHER): Payer: Medicare Other | Admitting: General Surgery

## 2012-09-21 ENCOUNTER — Encounter (INDEPENDENT_AMBULATORY_CARE_PROVIDER_SITE_OTHER): Payer: Self-pay | Admitting: General Surgery

## 2012-09-21 ENCOUNTER — Telehealth (INDEPENDENT_AMBULATORY_CARE_PROVIDER_SITE_OTHER): Payer: Self-pay | Admitting: General Surgery

## 2012-09-21 VITALS — BP 122/64 | HR 68 | Temp 98.5°F | Resp 12 | Ht 67.0 in | Wt 167.4 lb

## 2012-09-21 DIAGNOSIS — Z4889 Encounter for other specified surgical aftercare: Secondary | ICD-10-CM

## 2012-09-21 DIAGNOSIS — Z5189 Encounter for other specified aftercare: Secondary | ICD-10-CM

## 2012-09-21 MED ORDER — DOCUSATE SODIUM 100 MG PO CAPS: 100.0000 mg | ORAL_CAPSULE | Freq: Two times a day (BID) | ORAL | Status: DC

## 2012-09-21 NOTE — Telephone Encounter (Signed)
Spoke with patient and explained to him he will be receiving paper work to fill out from [facility] . As soon as he fills out and gets it back to them they will give him a appt. I spoke with [facility] notes were faxed conformation received .

## 2012-09-21 NOTE — Progress Notes (Signed)
Subjective:     Patient ID: [name], male   DOB: 06-May-1951, 63 y.o.   MRN: [id number]  HPI This patient follows up for about one month status post exploratory laparotomy and lysis of adhesions for bowel obstruction which did not improve with nonoperative management. He says that his bowels have recovered and is moving his bowels although the other day he did have to take an enema but he had good results and feels well. He does still have some constant, low-grade abdominal pain across his lower abdomen. He is taking some pain medication for this. He was walking on a treadmill and fell a few weeks ago and has had some abdominal pain since then. He also complains of a sore throat and some difficulty swallowing and feeling that his throat is scratchy Review of Systems     Objective:   Physical Exam He is in no acute distress sitting comfortably in a chair His abdomen is soft with mild incisional pain. His wound is healing nicely without any evidence of recurrent hernia or infection. I did apply some silver nitrate to a small area of granulation tissue at the upper aspect of his wound    Assessment:     Status post lysis of adhesions for bowel obstruction-improving He seems to be improving from his surgery and I think that he will continue to improve daily. Not sure if his persistent abdominal pain is due to his fall but I do not see any evidence of any complication. He seems to be moving his bowels appropriately and I did prescribe some Colace for the time that he remains on pain medication. I recommended that he followup with primary care physician for management of his diabetes and his diabetic supplies and if his sore throat persists and difficulty swallowing, then we can refer him to ENT for evaluation    Plan:     Gradually increase activity as tolerated and weaning pain medication Follow up with primary care physician for diabetes management

## 2012-09-21 NOTE — Telephone Encounter (Signed)
Spoke with Lorenza Chick patient has appt 09/24/12

## 2012-09-25 ENCOUNTER — Telehealth (INDEPENDENT_AMBULATORY_CARE_PROVIDER_SITE_OTHER): Payer: Self-pay | Admitting: General Surgery

## 2012-09-25 NOTE — Telephone Encounter (Signed)
Spoke with patient he has appt with [name] NP 10/01/12 at 10am . Patient also states he is still having throat problems would like for Dr Lilyan Punt to know .

## 2012-10-30 ENCOUNTER — Other Ambulatory Visit (HOSPITAL_COMMUNITY): Payer: Self-pay | Admitting: Otolaryngology

## 2012-10-30 DIAGNOSIS — K219 Gastro-esophageal reflux disease without esophagitis: Secondary | ICD-10-CM

## 2012-10-30 DIAGNOSIS — R1310 Dysphagia, unspecified: Secondary | ICD-10-CM

## 2012-11-05 ENCOUNTER — Ambulatory Visit (HOSPITAL_COMMUNITY)
Admission: RE | Admit: 2012-11-05 | Discharge: 2012-11-05 | Disposition: A | Discharge status: Home / Self Care | Payer: Medicare Other | Source: Ambulatory Visit | Attending: Otolaryngology | Admitting: Otolaryngology

## 2012-11-05 DIAGNOSIS — R1310 Dysphagia, unspecified: Secondary | ICD-10-CM

## 2012-11-05 DIAGNOSIS — R059 Cough, unspecified: Secondary | ICD-10-CM | POA: Insufficient documentation

## 2012-11-05 DIAGNOSIS — R05 Cough: Secondary | ICD-10-CM | POA: Insufficient documentation

## 2012-11-05 DIAGNOSIS — M5380 Other specified dorsopathies, site unspecified: Secondary | ICD-10-CM | POA: Insufficient documentation

## 2012-11-05 DIAGNOSIS — R490 Dysphonia: Secondary | ICD-10-CM | POA: Insufficient documentation

## 2012-11-05 DIAGNOSIS — K219 Gastro-esophageal reflux disease without esophagitis: Secondary | ICD-10-CM

## 2012-11-05 NOTE — Procedures (Signed)
Objective Swallowing Evaluation: Modified Barium Swallowing Study  Patient Details  Name: [name] MRN: [id number] Date of Birth: 19-Aug-1950  Today's Date: 11/05/2012 Time: 1300-1338 SLP Time Calculation (min): 38 min  Past Medical History:  Past Medical History  Diagnosis Date  . Diabetes mellitus without complication   . Cancer   . Prostate cancer   . Fluid overload 09/03/2012    Post op  . Ileus following gastrointestinal surgery 09/03/2012   Past Surgical History:  Past Surgical History  Procedure Laterality Date  . Hernia repair    . Prostate surgery    . Shoulder surgery    . Laparotomy  08/23/2012    Procedure: EXPLORATORY LAPAROTOMY;  Surgeon: [name], DO;  Location: [facility];  Service: General;  Laterality: N/A;  exploratory laparotomy with lysis of adhesions  . Lysis of adhesion  08/23/2012    Procedure: LYSIS OF ADHESION;  Surgeon: [name], DO;  Location: [facility];  Service: General;;   HPI:  This 62 y.o. male c/o pain with swallowing, coughing during meals, and coughing up mucous.  Pt. reports since surgery in January he has had changes in his voice, and pain with swallowing, as well as frequent coughing with and without food/liquids.     Assessment / Plan / Recommendation Clinical Impression  Dysphagia Diagnosis: Within Functional Limits Clinical impression: Patient has an essentially normal swallow, with the exception of trace/transient penetration of thin liquids during the swallow.  Patient had a strong cough respone (inconsistently) with penetration, and c/o pain during swallowing.  There was a slight osteophyte observed near the UES, but this did not appear to impinge on the esophagus.  The 19mm barium tablet passsed through the esophagus without delay.  No radiologist was present during this study to confirm.    Treatment Recommendation  No treatment recommended at this time    Diet Recommendation Regular;Thin liquid   Liquid Administration via:  Cup;No straw Medication Administration: Whole meds with liquid Supervision: Patient able to self feed Compensations: Slow rate;Small sips/bites Postural Changes and/or Swallow Maneuvers: Seated upright 90 degrees;Upright 30-60 min after meal    Other  Recommendations Oral Care Recommendations: Oral care QID;Patient independent with oral care Other Recommendations: Clarify dietary restrictions (Carb modified)   Follow Up Recommendations  None    Frequency and Duration        Pertinent Vitals/Pain n/a    SLP Swallow Goals     General HPI: This 62 y.o. male c/o pain with swallowing, coughing during meals, and coughing up mucous.  Pt. reports since surgery in January he has had changes in his voice, and pain with swallowing, as well as frequent coughing with and without food/liquids. Type of Study: Modified Barium Swallowing Study Reason for Referral: Objectively evaluate swallowing function Previous Swallow Assessment: N/A Diet Prior to this Study: Regular;Thin liquids Temperature Spikes Noted: No Respiratory Status: Room air History of Recent Intubation: No ((In January of 2014)) Behavior/Cognition: Alert;Cooperative;Pleasant mood Oral Cavity - Dentition: Missing dentition Oral Motor / Sensory Function: Within functional limits Self-Feeding Abilities: Able to feed self Patient Positioning: Upright in chair Baseline Vocal Quality: Clear;Hoarse Volitional Cough: Strong Volitional Swallow: Able to elicit Anatomy: Within functional limits Pharyngeal Secretions: Not observed secondary MBS    Reason for Referral Objectively evaluate swallowing function   Oral Phase Oral Preparation/Oral Phase Oral Phase: WFL   Pharyngeal Phase Pharyngeal Phase Pharyngeal Phase: Impaired Pharyngeal - Thin Pharyngeal - Thin Straw: Penetration/Aspiration during swallow Penetration/Aspiration details (thin straw): Material enters airway,  remains ABOVE vocal cords then ejected out  Cervical  Esophageal Phase    GO    Cervical Esophageal Phase Cervical Esophageal Phase: [facility]    Functional Assessment Tool Used: skilled observation Functional Limitations: Swallowing Swallow Goal Status [id number]): 0 percent impaired, limited or restricted Swallow Discharge Status [id number]): 0 percent impaired, limited or restricted    Quinn Axe T 11/05/2012, 1:55 PM

## 2012-11-08 ENCOUNTER — Encounter: Payer: Self-pay | Admitting: Cardiothoracic Surgery

## 2012-11-08 ENCOUNTER — Other Ambulatory Visit: Payer: Self-pay | Admitting: *Deleted

## 2012-11-08 ENCOUNTER — Ambulatory Visit (INDEPENDENT_AMBULATORY_CARE_PROVIDER_SITE_OTHER): Payer: Medicare Other | Admitting: Cardiothoracic Surgery

## 2012-11-08 VITALS — BP 126/71 | HR 74 | Resp 16 | Ht 66.0 in | Wt 167.0 lb

## 2012-11-08 DIAGNOSIS — R590 Localized enlarged lymph nodes: Secondary | ICD-10-CM

## 2012-11-08 DIAGNOSIS — R599 Enlarged lymph nodes, unspecified: Secondary | ICD-10-CM

## 2012-11-08 NOTE — Progress Notes (Signed)
[address]            [place] [address]          [phone]      Luke L Parcher [place] Medical Record [id number] Date of Birth: 09/04/1950  Referring: Thornell Sartorius, MD Primary Care: [name], MD  Chief Complaint:    Chief Complaint  Patient presents with  . Adenopathy    mediastinal....eval and treat...CT CHEST @ [facility]    History of Present Illness:  Patient is a 62 year old diabetic male with severe diabetic retinopathy and loss of vision secondary to diabetes who is referred by Dr. Ernesto Rutherford ENT because of the incidental finding of mediastinal adenopathy on a CT scan of the neck. The patient was recently hospitalized with small bowel obstruction at [facility]. He underwent exploratory laparotomy and lysis of adhesions. During appear to time in the hospital the patient claims that his nose and vocal cords were traumatized by the placement of an NG tube. Since that time he's had difficulty swallowing and was referred to ENT. CT scan of the neck suggested mediastinal adenopathy and a full CT of the chest was performed at [facility]. After the patient came to the office and we investigated his medical records it appears that he has had evaluation for mediastinal adenopathy in the past and was thought to have pulmonary sarcoid can find no evidence of biopsy of this, or comparison CT scans.      From epic chart :Annotation: Normal PFTs 07/27/06, including diffusion capacity ACE I level <3--low 2007 CT of chest for prostate cancer evaluation 06/2006 showed mediastinal and  bilateral hilar adenopathy consistent with pulmonary sarcoid. No treatment as asymptomatic. Qualifier: Diagnosis of  By: Amil Amen MD, Benjamine Mola    Current Activity/ Functional Status:  Patient is independent with mobility/ambulation, transfers, ADL's, IADL's.  Zubrod Score: At the time of surgery this patient's most appropriate activity  status/level should be described as: []  Normal activity, no symptoms [x]  Symptoms, fully ambulatory []  Symptoms, in bed less than or equal to 50% of the time []  Symptoms, in bed greater than 50% of the time but less than 100% []  Bedridden []  Moribund   Past Medical History  Diagnosis Date  . Diabetes mellitus without complication   . Cancer   . Prostate cancer   . Fluid overload 09/03/2012    Post op  . Ileus following gastrointestinal surgery 09/03/2012    Past Surgical History  Procedure Laterality Date  . Hernia repair    . Prostate surgery    . Shoulder surgery    . Laparotomy  08/23/2012    Procedure: EXPLORATORY LAPAROTOMY;  Surgeon: [name], DO;  Location: [facility];  Service: General;  Laterality: N/A;  exploratory laparotomy with lysis of adhesions  . Lysis of adhesion  08/23/2012    Procedure: LYSIS OF ADHESION;  Surgeon: [name], DO;  Location: [facility];  Service: General;;    Family History  Problem Relation Age of Onset  . Diabetes Mellitus II Mother    patient's mother is alive with high blood pressure his father is deceased with stroke and high blood pressure unsure of the age  History   Social History  . Marital Status: Married    Spouse Name: N/A    Number of Children: N/A  . Years of Education: N/A   Occupational History  . Not  on file.   Social History Main Topics  . Smoking status: Former Smoker    Types: Cigarettes    Quit date: 11/09/1966  . Smokeless tobacco: Never Used  . Alcohol Use: Yes     Comment: SPARINGLY  . Drug Use: Not on file  . Sexually Active: No      History  Smoking status  . Former Smoker  . Types: Cigarettes  . Quit date: 11/09/1966  Smokeless tobacco  . Never Used    History  Alcohol Use  . Yes    Comment: SPARINGLY     Allergies  Allergen Reactions  . Naproxen Anaphylaxis    REACTION: Throat swells and cannot breathe    Current Outpatient Prescriptions  Medication Sig Dispense Refill  . aspirin  81 MG chewable tablet Chew 1 tablet (81 mg total) by mouth daily.      . cloNIDine (CATAPRES - DOSED IN MG/24 HR) 0.2 mg/24hr patch Place 1 patch (0.2 mg total) onto the skin once a week.  30 patch  0  . docusate sodium (COLACE) 100 MG capsule Take 1 capsule (100 mg total) by mouth 2 (two) times daily.  60 capsule  0  . furosemide (LASIX) 20 MG tablet Take 1 tablet (20 mg total) by mouth daily.  3 tablet  0  . glipiZIDE (GLUCOTROL) 5 MG tablet Take 1 tablet (5 mg total) by mouth daily before breakfast.  30 tablet  0  . HYDROcodone-acetaminophen (HYCET) 7.5-325 mg/15 ml solution Take 15 mLs by mouth every 8 (eight) hours as needed for pain.  120 mL  0  . insulin glargine (LANTUS) 100 UNIT/ML injection Inject 5 Units into the skin at bedtime.  10 mL  1  . lisinopril (PRINIVIL,ZESTRIL) 10 MG tablet Take 1 tablet (10 mg total) by mouth daily.  30 tablet  0  . metFORMIN (GLUCOPHAGE) 500 MG tablet Take 1,000 mg by mouth daily.      [name] oxyCODONE (OXY IR/ROXICODONE) 5 MG immediate release tablet Take 1-2 tablets (5-10 mg total) by mouth every 4 (four) hours as needed for pain.  50 tablet  0  . simvastatin (ZOCOR) 80 MG tablet Take 80 mg by mouth daily.       No current facility-administered medications for this visit.       Review of Systems:     Cardiac Review of Systems: Y or N  Chest Pain [   n ]  Resting SOB [  n ] Exertional SOB  [ n ]  Orthopnea [n  ]   Pedal Edema [  n ]    Palpitations [ n ] Syncope  [n ]   Presyncope [ n  ]  General Review of Systems: [Y] = yes [  ]=no Constitional: recent weight change [ y ]; anorexia [  ]; fatigue [ y ]; nausea [ y ]; night sweats [  ]; fever [ n ]; or chills [ n ];  Dental: poor dentition[y  ]; Last Dentist visit:   Eye : blurred vision [  ]; diplopia [   ]; vision changes [ y ];  Amaurosis fugax[n  ]; Resp: cough [  ];   wheezing[  ];  hemoptysis[  ]; shortness of breath[  ]; paroxysmal nocturnal dyspnea[  ]; dyspnea on exertion[  ]; or orthopnea[  ];  GI:  gallstones[  ], vomiting[  ];  dysphagia[  ]; melena[  ];  hematochezia [  ]; heartburn[  ];   Hx of  Colonoscopy[  ]; GU: kidney stones [  ]; hematuria[  ];   dysuria [  ];  nocturia[  ];  history of     obstruction [  ]; urinary frequency [ y ]             Skin: rash, swelling[  ];, hair loss[  ];  peripheral edema[  ];  or itching[  ]; Musculosketetal: myalgias[  ];  joint swelling[  ];  joint erythema[  ];  joint pain[  ];  back pain[  ];  Heme/Lymph: bruising[  ];  bleeding[  ];  anemia[  ];  Neuro: TIA[  ];  headaches[  ];  stroke[  ];  vertigo[  ];  seizures[  ];   paresthesias[  ];  difficulty walking[  ];  Psych:depression[  ]; anxiety[  ];  Endocrine: diabetes[  ];  thyroid dysfunction[  ];  Immunizations: Flu [?  ]; Pneumococcal[?  ];  Other:  Physical Exam: BP 126/71  Pulse 74  Resp 16  Ht 5\' 6"  (1.676 m)  Wt 167 lb (75.751 kg)  BMI 26.97 kg/m2  SpO2 96%  General appearance: alert, cooperative, appears older than stated age and poor vesion Neurologic: intact Heart: regular rate and rhythm, S1, S2 normal, no murmur, click, rub or gallop Lungs: clear to auscultation bilaterally Abdomen: soft, non-tender; bowel sounds normal; no masses,  no organomegaly Extremities: extremities normal, atraumatic, no cyanosis or edema and Homans sign is negative, no sign of DVT Lower midline abdominal incision is healed, should be noted the patient does not have any scars of mediastinoscopy in the past he has no carotid bruits   Diagnostic Studies & Laboratory data:     Recent Radiology Findings:     CT of chest [facility] 11/08/2012  Extensive mediastinal and hilar lymphadenopathy is confirmed with the largest nodes measuring approximately 2.0 cm short axis lateral to the pulmonary artery, 1.8 cm right pretracheal space, 3 cm right perihilar region,  1.5 cm left perihilar region and approximately 4 x 2 x 5.5 cm subcarinal space. A 1 cm size node is present at the base of the great vessels. Additional subcentimeter lymph nodes are noted. The trachea and proximal bronchi are patent. The lungs are well expanded. Small parenchymal nodules measure 4 mm within the right upper lobe (image #34) and 5 mm left lung base (image #61). The lungs are otherwise clear without dominant mass, consolidation or pleural effusion. No chest wall abnormality. Mild degenerative osseous changes noted without destructive lesion. Hepatic hypoattenuation without discrete focal nodule. The imaged upper abdominal contents are otherwise unremarkable. Mild calcific atherosclerotic vascular changes with coronary arterial involvement. IMPRESSION: 1. Extensive mediastinal and hilar lymphadenopathy. Consider lymphoma or other immunoproliferative disorders, metastatic disease, inflammatory disorders such as sarcoidosis as well as reactive infectious or inflammatory adenopathy. Pathologic correlation/biopsy recommended. 2. Two small lung nodules as above. Followup chest CT in three to six months is recommended for surveillance.  3. Hepatic steatosis. 4. Mild atherosclerotic changes with coronary arterial involvement. Preliminary findings discussed with Dr. Ernesto Rutherford 11/08/2012 Reubin Milan, MD     Recent Lab Findings: Lab Results  Component Value Date   WBC 4.8 08/31/2012   HGB 11.6* 08/31/2012   HCT 36.4* 08/31/2012   PLT 262 08/31/2012   GLUCOSE 181* 09/04/2012   CHOL 141 11/20/2007   TRIG 110 11/20/2007   HDL 40 11/20/2007   LDLCALC 79 11/20/2007   ALT 20 08/31/2012   AST 32 08/31/2012   NA 135 09/04/2012   K 4.7 09/04/2012   CL 97 09/04/2012   CREATININE 1.18 09/04/2012   BUN 9 09/04/2012   CO2 29 09/04/2012   HGBA1C 16.3* 08/22/2012    More then 45 min spent seeing parient and discussing case with referring MD and trying to get [facility] and  [facility]  Assessment / Plan:  The patient is referred to thoracic oncology by Dr. Ernesto Rutherford ENT/ for question of biopsy of mediastinum. After the patient was urgently seen in the office to consider biopsy is incomplete records suggested a previous evaluation for sarcoid in 2008. In the office today we're unable to obtain the details of these records. I've asked the patient to obtain copies of the CT scans of the chest that he has had in the past and bring them for review.   Referral to pulmonary medicine for evaluation of diagnosis of sarcoid/and followup CT scan for pulmonary nodules surveillance  After the patient is seen by pulmonary medicine and his medical records and scans are obtained we can proceed with mediastinoscopy if it still appears to be indicated.    Grace Isaac MD  Beeper [phone] Office [phone] 11/08/2012 2:13 PM

## 2012-11-13 ENCOUNTER — Encounter: Payer: Self-pay | Admitting: Internal Medicine

## 2012-11-13 ENCOUNTER — Ambulatory Visit (INDEPENDENT_AMBULATORY_CARE_PROVIDER_SITE_OTHER): Payer: Medicare Other | Admitting: Internal Medicine

## 2012-11-13 VITALS — BP 120/70 | HR 73 | Temp 98.3°F | Ht 66.0 in | Wt 167.0 lb

## 2012-11-13 DIAGNOSIS — D869 Sarcoidosis, unspecified: Secondary | ICD-10-CM

## 2012-11-13 DIAGNOSIS — I10 Essential (primary) hypertension: Secondary | ICD-10-CM

## 2012-11-13 DIAGNOSIS — R05 Cough: Secondary | ICD-10-CM

## 2012-11-13 MED ORDER — OLMESARTAN MEDOXOMIL 20 MG PO TABS: ORAL_TABLET | ORAL | Status: DC

## 2012-11-13 NOTE — Progress Notes (Signed)
  Subjective:    Patient ID: [name], male    DOB: [birth year]  MRN: [id number]  HPI  48 yobm quit smoking 1968 dx sarcoid by [facility] with sarcoid 2007  s/p lap Jan [facility] 2014 eval by Ernesto Rutherford for post op dysphagia, sore throat hoarsness > Crossley > Gerheardt > consider bx of med adenopathy.  11/13/2012 1st [facility] eval on ace c/o hoarseness,  Sensation of something stuck in throat and hard to swallow, dry cough p ng and ET in January.  Sensation of choking and sob.   No obvious daytime variabilty or assoc excess mucus production or cp or chest tightness, subjective wheeze overt sinus or hb symptoms. No unusual exp hx or h/o childhood pna/ asthma or premature birth to his knowledge.   Sleeping ok without nocturnal  or early am exacerbation  of respiratory  c/o's or need for noct saba. Also denies any obvious fluctuation of symptoms with weather or environmental changes or other aggravating or alleviating factors except as outlined above   Review of Systems  Constitutional: Negative for fever and unexpected weight change.  HENT: Positive for sore throat and trouble swallowing. Negative for ear pain, nosebleeds, congestion, rhinorrhea, sneezing, dental problem, postnasal drip and sinus pressure.   Eyes: Negative for redness and itching.  Respiratory: Positive for cough and shortness of breath. Negative for chest tightness and wheezing.   Cardiovascular: Negative for palpitations and leg swelling.  Gastrointestinal: Negative for nausea and vomiting.  Genitourinary: Negative for dysuria.  Musculoskeletal: Negative for joint swelling.  Skin: Negative for rash.  Neurological: Negative for headaches.  Hematological: Does not bruise/bleed easily.  Psychiatric/Behavioral: Negative for dysphoric mood. The patient is not nervous/anxious.        Objective:   Physical Exam  Wt Readings from Last 3 Encounters:  11/13/12 167 lb (75.751 kg)  11/08/12 167 lb (75.751 kg)  09/21/12 167  lb 6.4 oz (75.932 kg)   Hoarse amb wm nad  HEENT: nl dentition, turbinates, and orophanx. Nl external ear canals without cough reflex   NECK :  without JVD/Nodes/TM/ nl carotid upstrokes bilaterally   LUNGS: no acc muscle use, clear to A and P bilaterally without cough on insp or exp maneuvers   CV:  RRR  no s3 or murmur or increase in P2, no edema   ABD:  soft and nontender with nl excursion in the supine position. No bruits or organomegaly, bowel sounds nl  MS:  warm without deformities, calf tenderness, cyanosis or clubbing  SKIN: warm and dry without lesions    NEURO:  alert, approp, no deficits         Assessment & Plan:

## 2012-11-13 NOTE — Patient Instructions (Addendum)
Stop lisinopril and start benicar 20 mg one half daily  Call Dr Servando Snare and Ernesto Rutherford and cancel follow up until I see you back  Stop fish oil for now  Omeprazole 20 mg Take 30- 60 min before your first and last meals of the day until better then one daily before bfast  Please schedule a follow up office visit in 4 weeks, sooner if needed with CXR

## 2012-11-15 ENCOUNTER — Ambulatory Visit: Payer: Medicare Other | Admitting: Cardiothoracic Surgery

## 2012-11-15 DIAGNOSIS — R05 Cough: Secondary | ICD-10-CM | POA: Insufficient documentation

## 2012-11-15 NOTE — Assessment & Plan Note (Signed)
:   Normal PFTs 07/27/06, including diffusion capacity ACE I level <3--low 2007 CT of chest for prostate cancer evaluation 06/2006 showed mediastinal and  bilateral hilar adenopathy consistent with pulmonary sarcoid.  No indication for bx or treatment at this point, strongly doubt this has anything to do with his symptoms which are all upper airway and explained by ACEi plus trauma to airway with bradykinin release

## 2012-11-15 NOTE — Assessment & Plan Note (Signed)
The most common causes of chronic cough in immunocompetent adults include the following: upper airway cough syndrome (UACS), previously referred to as postnasal drip syndrome (PNDS), which is caused by variety of rhinosinus conditions; (2) asthma; (3) GERD; (4) chronic bronchitis from cigarette smoking or other inhaled environmental irritants; (5) nonasthmatic eosinophilic bronchitis; and (6) bronchiectasis.   These conditions, singly or in combination, have accounted for up to 94% of the causes of chronic cough in prospective studies.   Other conditions have constituted no >6% of the causes in prospective studies These have included bronchogenic carcinoma, chronic interstitial pneumonia, sarcoidosis, left ventricular failure, ACEI-induced cough, and aspiration from a condition associated with pharyngeal dysfunction.    Chronic cough is often simultaneously caused by more than one condition. A single cause has been found from 38 to 82% of the time, multiple causes from 18 to 62%. Multiply caused cough has been the result of three diseases up to 42% of the time.       This is classic  Classic Upper airway cough syndrome, so named because it's frequently impossible to sort out how much is  CR/sinusitis with freq throat clearing (which can be related to primary GERD)   vs  causing  secondary (" extra esophageal")  GERD from wide swings in gastric pressure that occur with throat clearing, often  promoting self use of mint and menthol lozenges that reduce the lower esophageal sphincter tone and exacerbate the problem further in a cyclical fashion.   These are the same pts (now being labeled as having "irritable larynx syndrome" by some cough centers) who not infrequently have a history of having failed to tolerate ace inhibitors,  dry powder inhalers or biphosphonates or report having atypical reflux symptoms that don't respond to standard doses of PPI , and are easily confused as having aecopd or asthma  flares by even experienced allergists/ pulmonologists.   For now try off acei, rx gerd and then regroup

## 2012-11-15 NOTE — Assessment & Plan Note (Signed)

## 2012-12-14 ENCOUNTER — Ambulatory Visit: Payer: Medicare Other | Admitting: Internal Medicine

## 2012-12-20 ENCOUNTER — Ambulatory Visit: Payer: Medicare Other | Admitting: Cardiothoracic Surgery

## 2013-01-03 ENCOUNTER — Ambulatory Visit: Payer: Medicare Other | Admitting: Internal Medicine

## 2013-01-04 ENCOUNTER — Ambulatory Visit: Payer: Medicare Other | Admitting: Internal Medicine

## 2013-01-14 ENCOUNTER — Ambulatory Visit: Payer: Medicare Other | Admitting: Adult Health

## 2013-01-15 ENCOUNTER — Ambulatory Visit (INDEPENDENT_AMBULATORY_CARE_PROVIDER_SITE_OTHER)
Admission: RE | Admit: 2013-01-15 | Discharge: 2013-01-15 | Disposition: A | Discharge status: Home / Self Care | Payer: Medicare Other | Source: Ambulatory Visit | Attending: Adult Health | Admitting: Adult Health

## 2013-01-15 ENCOUNTER — Encounter: Payer: Self-pay | Admitting: Adult Health

## 2013-01-15 ENCOUNTER — Ambulatory Visit (INDEPENDENT_AMBULATORY_CARE_PROVIDER_SITE_OTHER): Payer: Medicare Other | Admitting: Adult Health

## 2013-01-15 VITALS — BP 126/72 | HR 66 | Temp 98.5°F | Ht 66.0 in | Wt 169.6 lb

## 2013-01-15 DIAGNOSIS — D869 Sarcoidosis, unspecified: Secondary | ICD-10-CM

## 2013-01-15 DIAGNOSIS — R05 Cough: Secondary | ICD-10-CM

## 2013-01-15 NOTE — Patient Instructions (Addendum)
Restart Benicar 20 mg one half daily  Continue to hold fish oil for now  Omeprazole 20 mg Take 30- 60 min before your first and last meals of the day until better then one daily before bfast  Please schedule a follow up office visit in 4 weeks with Dr. Melvyn Novas

## 2013-01-15 NOTE — Progress Notes (Signed)
  Subjective:    Patient ID: [name], male    DOB: 1951/02/09  MRN: [id number]  HPI 88 yobm quit smoking 1968 dx sarcoid by [facility] with sarcoid 2007  s/p lap [facility] 2014 eval by Ernesto Rutherford for post op dysphagia, sore throat hoarsness > Crossley > Gerheardt > consider bx of med adenopathy.  11/13/2012 1st [facility] eval on ace c/o hoarseness,  Sensation of something stuck in throat and hard to swallow, dry cough p ng and ET in January.  Sensation of choking and sob.   >>changed ACE to ARB.  01/15/2013 Follow up - reports cough has returned since running out of omeprazole and benicar 10days ago Feels much better , cough is decreased. Last visit seen for cough and hoarseness, changed from ACE to ARB. Started on PPI therapy.  Ran out of PPI and benicar ~10 days ago.  Dry cough starting to come back.  No fever, discolored mucus, n/v, rash, visual changes      Review of Systems  Constitutional:   No  weight loss, night sweats,  Fevers, chills, fatigue, or  lassitude.  HEENT:   No headaches,  Difficulty swallowing,  Tooth/dental problems, or  Sore throat,                No sneezing, itching, ear ache,  +nasal congestion, post nasal drip,   CV:  No chest pain,  Orthopnea, PND, swelling in lower extremities, anasarca, dizziness, palpitations, syncope.   GI  No heartburn, indigestion, abdominal pain, nausea, vomiting, diarrhea, change in bowel habits, loss of appetite, bloody stools.   Resp:  No chest wall deformity  Skin: no rash or lesions.  GU: no dysuria, change in color of urine, no urgency or frequency.  No flank pain, no hematuria   MS:  No joint pain or swelling.  No decreased range of motion.  No back pain.  Psych:  No change in mood or affect. No depression or anxiety.  No memory loss.         Objective:   Physical Exam  amb wm nad  HEENT: nl dentition, turbinates, and orophanx. Nl external ear canals without cough reflex   NECK :  without  JVD/Nodes/TM/ nl carotid upstrokes bilaterally   LUNGS: no acc muscle use, clear to A and P bilaterally without cough on insp or exp maneuvers   CV:  RRR  no s3 or murmur or increase in P2, no edema   ABD:  soft and nontender with nl excursion in the supine position. No bruits or organomegaly, bowel sounds nl  MS:  warm without deformities, calf tenderness, cyanosis or clubbing  SKIN: warm and dry without lesions    NEURO:  alert, approp, no deficits         Assessment & Plan:

## 2013-01-16 MED ORDER — OLMESARTAN MEDOXOMIL 20 MG PO TABS: ORAL_TABLET | ORAL | Status: DC

## 2013-01-16 NOTE — Assessment & Plan Note (Signed)
Cough improved with GERD prevention and elimination of ACE inhibitor   Plan  Restart Benicar 20 mg one half daily  Continue to hold fish oil for now  Omeprazole 20 mg Take 30- 60 min before your first and last meals of the day until better then one daily before bfast  Please schedule a follow up office visit in 4 weeks with Dr. Melvyn Novas

## 2013-01-16 NOTE — Assessment & Plan Note (Signed)
Check xray today  Cough improved with GERD prevention and elimination of ACE inhibitor.   Plan  Check xray today  Set up for PFT or spirometry on return.

## 2013-01-18 NOTE — Progress Notes (Signed)
Quick Note:  Called spoke with patient, advised of cxr results / recs as stated by TP. Pt verbalized his understanding and denied any questions. ______ 

## 2013-01-28 ENCOUNTER — Encounter: Payer: Self-pay | Admitting: Internal Medicine

## 2013-02-14 ENCOUNTER — Ambulatory Visit: Payer: Medicare Other | Admitting: Internal Medicine

## 2013-02-22 ENCOUNTER — Ambulatory Visit: Payer: Medicare Other | Admitting: Internal Medicine

## 2013-02-26 ENCOUNTER — Encounter: Payer: Self-pay | Admitting: *Deleted

## 2013-02-26 ENCOUNTER — Encounter: Payer: Self-pay | Admitting: Internal Medicine

## 2013-02-26 ENCOUNTER — Ambulatory Visit (INDEPENDENT_AMBULATORY_CARE_PROVIDER_SITE_OTHER): Payer: Medicare Other | Admitting: Internal Medicine

## 2013-02-26 VITALS — BP 114/70 | HR 74 | Temp 98.7°F | Ht 66.0 in | Wt 168.8 lb

## 2013-02-26 DIAGNOSIS — R05 Cough: Secondary | ICD-10-CM

## 2013-02-26 DIAGNOSIS — D869 Sarcoidosis, unspecified: Secondary | ICD-10-CM

## 2013-02-26 NOTE — Patient Instructions (Addendum)
Omeprazole 20 mg Take 30- 60 min before your first and last meals of the day until better then one daily before bfast  GERD (REFLUX)  is an extremely common cause of respiratory symptoms, many times with no significant heartburn at all.    It can be treated with medication, but also with lifestyle changes including avoidance of late meals, excessive alcohol, smoking cessation, and avoid fatty foods, chocolate, peppermint, colas, red wine, and acidic juices such as orange juice.  NO MINT OR MENTHOL PRODUCTS SO NO COUGH DROPS  USE SUGARLESS CANDY INSTEAD (jolley ranchers or Stover's)  NO OIL BASED VITAMINS - use powdered substitutes.     If you are satisfied with your treatment plan let your doctor know and he/she can either refill your medications or you can return here when your prescription runs out.     If in any way you are not 100% satisfied,  please tell us.  If 100% better, tell your friends!

## 2013-02-26 NOTE — Progress Notes (Signed)
Subjective:    Patient ID: [name], male    DOB: 1951-08-12  MRN: [id number]  [name] is Primary    Brief patient profile:  35 yobm quit smoking 1968 dx sarcoid by [facility] with sarcoid 2007  s/p lap Jan [facility] 2014 eval by Ernesto Rutherford for post op dysphagia, sore throat hoarsness > [place] > Gerheardt > consider bx of med adenopathy.       HPI 11/13/2012 [facility] eval on ace c/o hoarseness,  Sensation of something stuck in throat and hard to swallow, dry cough p ng and ET in January.  Sensation of choking and sob.   >>changed ACE to ARB.  01/15/2013 Follow up - reports cough has returned since running out of omeprazole and benicar 10days prior to OV   Feels much better , cough is decreased. Last visit seen for cough and hoarseness, changed from ACE to ARB. Started on PPI therapy.  Ran out of PPI and benicar ~10 days ago.  Dry cough starting to come back.  No fever, discolored mucus, n/v, rash, visual changes  rec Restart Benicar 20 mg one half daily Continue to hold fish oil for now Omeprazole 20 mg Take 30- 60 min before your first and last meals of the day until better then one daily before bfast   02/26/2013 f/u ov/[name] / fu/ cough and hilar adenopathy/  can't afford meds Chief Complaint  Patient presents with  . Follow-up    Pt states cough worse since the last visit- esp at night and is occ prod with light yellow sputum. He ran out of basically all meds about 2 wks ago   not on bp meds or gerd rx and worse since stopped.  No obvious daytime variabilty or assoc sob or cp or chest tightness, subjective wheeze overt sinus or hb symptoms. No unusual exp hx or h/o childhood pna/ asthma or knowledge of premature birth.     Also denies any obvious fluctuation of symptoms with weather or environmental changes or other aggravating or alleviating factors except as outlined above    Current Medications, Allergies, Past Medical History, Past Surgical History, Family  History, and Social History were reviewed in [employer] record.  ROS  The following are not active complaints unless bolded sore throat, dysphagia, dental problems, itching, sneezing,  nasal congestion or excess/ purulent secretions, ear ache,   fever, chills, sweats, unintended wt loss, pleuritic or exertional cp, hemoptysis,  orthopnea pnd or leg swelling, presyncope, palpitations, heartburn, abdominal pain, anorexia, nausea, vomiting, diarrhea  or change in bowel or urinary habits, change in stools or urine, dysuria,hematuria,  rash, arthralgias, visual complaints, headache, numbness weakness or ataxia or problems with walking or coordination,  change in mood/affect or memory.                       Objective:   Physical Exam   amb bm nad  Wt Readings from Last 3 Encounters:  02/26/13 168 lb 12.8 oz (76.567 kg)  01/15/13 169 lb 9.6 oz (76.93 kg)  11/13/12 167 lb (75.751 kg)      HEENT: nl dentition, turbinates, and orophanx. Nl external ear canals without cough reflex   NECK :  without JVD/Nodes/TM/ nl carotid upstrokes bilaterally   LUNGS: no acc muscle use, clear to A and P bilaterally without cough on insp or exp maneuvers   CV:  RRR  no s3 or murmur or increase in P2, no edema  ABD:  soft and nontender with nl excursion in the supine position. No bruits or organomegaly, bowel sounds nl  MS:  warm without deformities, calf tenderness, cyanosis or clubbing  SKIN: warm and dry without lesions       01/15/13  cxr No acute cardiopulmonary process. Stable hilar lymphadenopathy          Assessment & Plan:

## 2013-02-28 NOTE — Assessment & Plan Note (Signed)
:   Normal PFTs 07/27/06, including diffusion capacity ACE I level <3--low 2007 CT of chest for prostate cancer evaluation 06/2006 showed mediastinal and  bilateral hilar adenopathy consistent with pulmonary sarcoid.  No indication for rx, follow prn change on plain cxr or resp symptoms which occur off acei and on gerd rx  Pulmonary f/u prn

## 2013-02-28 NOTE — Assessment & Plan Note (Signed)
-   trial off acei started 11/14/12  Improvement off acei and on gerd rx stronlgy supports  Classic Upper airway cough syndrome, so named because it's frequently impossible to sort out how much is  CR/sinusitis with freq throat clearing (which can be related to primary GERD)   vs  causing  secondary (" extra esophageal")  GERD from wide swings in gastric pressure that occur with throat clearing, often  promoting self use of mint and menthol lozenges that reduce the lower esophageal sphincter tone and exacerbate the problem further in a cyclical fashion.   These are the same pts (now being labeled as having "irritable larynx syndrome" by some cough centers) who not infrequently have a history of having failed to tolerate ace inhibitors,  dry powder inhalers or biphosphonates or report having atypical reflux symptoms that don't respond to standard doses of PPI , and are easily confused as having aecopd or asthma flares by even experienced allergists/ pulmonologists.  Needs to stay off acei and rx gerd and if cough recurs then eval further

## 2013-06-20 ENCOUNTER — Other Ambulatory Visit: Payer: Self-pay

## 2013-08-07 ENCOUNTER — Emergency Department (HOSPITAL_COMMUNITY)
Admission: EM | Admit: 2013-08-07 | Discharge: 2013-08-07 | Disposition: A | Discharge status: Home / Self Care | Payer: Medicare Other | Attending: Emergency Medicine | Admitting: Emergency Medicine

## 2013-08-07 ENCOUNTER — Encounter (HOSPITAL_COMMUNITY): Payer: Self-pay | Admitting: Emergency Medicine

## 2013-08-07 DIAGNOSIS — H5711 Ocular pain, right eye: Secondary | ICD-10-CM

## 2013-08-07 DIAGNOSIS — Z792 Long term (current) use of antibiotics: Secondary | ICD-10-CM | POA: Insufficient documentation

## 2013-08-07 DIAGNOSIS — Z87891 Personal history of nicotine dependence: Secondary | ICD-10-CM | POA: Insufficient documentation

## 2013-08-07 DIAGNOSIS — H53149 Visual discomfort, unspecified: Secondary | ICD-10-CM | POA: Insufficient documentation

## 2013-08-07 DIAGNOSIS — H5710 Ocular pain, unspecified eye: Secondary | ICD-10-CM | POA: Insufficient documentation

## 2013-08-07 DIAGNOSIS — E119 Type 2 diabetes mellitus without complications: Secondary | ICD-10-CM | POA: Insufficient documentation

## 2013-08-07 DIAGNOSIS — Z794 Long term (current) use of insulin: Secondary | ICD-10-CM | POA: Insufficient documentation

## 2013-08-07 DIAGNOSIS — Z8546 Personal history of malignant neoplasm of prostate: Secondary | ICD-10-CM | POA: Insufficient documentation

## 2013-08-07 DIAGNOSIS — Z7982 Long term (current) use of aspirin: Secondary | ICD-10-CM | POA: Insufficient documentation

## 2013-08-07 DIAGNOSIS — Z87828 Personal history of other (healed) physical injury and trauma: Secondary | ICD-10-CM | POA: Insufficient documentation

## 2013-08-07 MED ORDER — TROPICAMIDE 0.5 % OP SOLN
2.0000 [drp] | Freq: Once | OPHTHALMIC | Status: DC
  Filled 2013-08-07: qty 15

## 2013-08-07 MED ORDER — TROPICAMIDE 1 % OP SOLN
2.0000 [drp] | Freq: Once | OPHTHALMIC | Status: AC
  Administered 2013-08-07: 2 [drp] via OPHTHALMIC
  Filled 2013-08-07 (×2): qty 2

## 2013-08-07 MED ORDER — PROPARACAINE HCL 0.5 % OP SOLN
2.0000 [drp] | Freq: Once | OPHTHALMIC | Status: AC
  Administered 2013-08-07: 2 [drp] via OPHTHALMIC
  Filled 2013-08-07: qty 15

## 2013-08-07 MED ORDER — FLUORESCEIN SODIUM 1 MG OP STRP
1.0000 | ORAL_STRIP | Freq: Once | OPHTHALMIC | Status: AC
  Administered 2013-08-07: 1 via OPHTHALMIC
  Filled 2013-08-07: qty 1

## 2013-08-07 NOTE — ED Notes (Signed)
Reports waking up this am with right eye pain and burning, no vision changes, no distress noted at triage.

## 2013-08-07 NOTE — ED Notes (Signed)
Pt comfortable with d/c and f/u instructions. No prescriptions 

## 2013-08-07 NOTE — ED Provider Notes (Signed)
CSN: [id number]     Arrival date & time 08/07/13  0701 History   First MD Initiated Contact with Patient 08/07/13 0740     Chief Complaint  Patient presents with  . Eye Pain   (Consider location/radiation/quality/duration/timing/severity/associated sxs/prior Treatment) Patient is a 62 y.o. male presenting with eye pain. The history is provided by the patient. No language interpreter was used.  Eye Pain This is a new problem. The current episode started today. The problem occurs constantly. Pertinent negatives include no fever. Associated symptoms comments: Right eye pain described as burning that was present when he woke this morning. No visual changes. No left eye involvement. He denies discharge or excessive tearing from the right eye. No redness. He states it feels as though he has "another corneal abrasion.".    Past Medical History  Diagnosis Date  . Diabetes mellitus without complication   . Cancer   . Prostate cancer   . Fluid overload 09/03/2012    Post op  . Ileus following gastrointestinal surgery 09/03/2012   Past Surgical History  Procedure Laterality Date  . Hernia repair    . Prostate surgery    . Shoulder surgery    . Laparotomy  08/23/2012    Procedure: EXPLORATORY LAPAROTOMY;  Surgeon: [name], DO;  Location: [facility];  Service: General;  Laterality: N/A;  exploratory laparotomy with lysis of adhesions  . Lysis of adhesion  08/23/2012    Procedure: LYSIS OF ADHESION;  Surgeon: [name], DO;  Location: [facility];  Service: General;;   Family History  Problem Relation Age of Onset  . Diabetes Mellitus II Mother    History  Substance Use Topics  . Smoking status: Former Smoker    Types: Cigarettes    Quit date: 11/09/1966  . Smokeless tobacco: Never Used  . Alcohol Use: Yes     Comment: SPARINGLY    Review of Systems  Constitutional: Negative for fever.  HENT: Negative for facial swelling.   Eyes: Positive for photophobia and pain. Negative for discharge,  redness and visual disturbance.    Allergies  Naproxen  Home Medications   Current Outpatient Rx  Name  Route  Sig  Dispense  Refill  . amoxicillin-clavulanate (AUGMENTIN) 875-125 MG per tablet   Oral   Take 1 tablet by mouth 2 (two) times daily.         [name] aspirin 81 MG chewable tablet   Oral   Chew 1 tablet (81 mg total) by mouth daily.         . cloNIDine (CATAPRES - DOSED IN MG/24 HR) 0.2 mg/24hr patch   Transdermal   Place 0.2 mg onto the skin once a week. On thursdays         . gabapentin (NEURONTIN) 300 MG capsule   Oral   Take 300 mg by mouth 2 (two) times daily.         [name] glipiZIDE (GLUCOTROL) 5 MG tablet   Oral   Take 1 tablet (5 mg total) by mouth daily before breakfast.   30 tablet   0   . insulin glargine (LANTUS) 100 UNIT/ML injection   Subcutaneous   Inject 5 Units into the skin at bedtime.   10 mL   1   . lisinopril (PRINIVIL,ZESTRIL) 10 MG tablet   Oral   Take 1 tablet by mouth daily.         . metFORMIN (GLUCOPHAGE) 500 MG tablet   Oral   Take 1,000 mg by mouth  daily.         . Multiple Vitamin (MULTIVITAMIN WITH MINERALS) TABS tablet   Oral   Take 1 tablet by mouth daily.         [name] olmesartan (BENICAR) 20 MG tablet      One half daily   30 tablet   5   . omeprazole (PRILOSEC) 20 MG capsule   Oral   Take 20 mg by mouth daily.          BP 157/77  Pulse 75  Temp(Src) 98 F (36.7 C) (Oral)  Resp 18  SpO2 99% Physical Exam  Constitutional: He appears well-developed and well-nourished. No distress.  HENT:  Head: Normocephalic.  Eyes:  Pupils are 1-2 mm bilaterally. Photophobia present in right eye. Nontender to palpation of globe. FROM. No foreign body present. Negative dye uptake, therefore, no evidence of corneal abrasion.    ED Course  Procedures (including critical care time) Labs Review Labs Reviewed - No data to display Imaging Review No results found.  EKG Interpretation   None       MDM  No  diagnosis found. 1. Right eye pain  Fundoscopic exam is without visualized hemorrhage. Area of hyperpigmentation. Photophobia.   Discussed patient's presentation with Dr. Laurena Spies with [facility] where patient receives care for retinopathy. Recommendation for Dry eyes OTC, used regularly and office follow up Friday or next week.   Dewaine Oats, PA-C 08/07/13 1103

## 2013-08-07 NOTE — ED Provider Notes (Signed)
Medical screening examination/treatment/procedure(s) were performed by non-physician practitioner and as supervising physician I was immediately available for consultation/collaboration.  EKG Interpretation   None       Rolland Porter, MD, Abram Sander   Janice Norrie, MD 08/07/13 [phone]

## 2013-11-11 ENCOUNTER — Other Ambulatory Visit: Payer: Self-pay | Admitting: Adult Health

## 2014-03-25 ENCOUNTER — Ambulatory Visit: Payer: Medicare Other | Admitting: Internal Medicine

## 2014-03-26 ENCOUNTER — Encounter: Payer: Self-pay | Admitting: Internal Medicine

## 2014-09-13 ENCOUNTER — Inpatient Hospital Stay (HOSPITAL_COMMUNITY)
Admission: EM | Admit: 2014-09-13 | Discharge: 2014-09-18 | DRG: 389 | Disposition: A | Discharge status: Home / Self Care | Payer: Medicare Other | Attending: Internal Medicine | Admitting: Internal Medicine

## 2014-09-13 ENCOUNTER — Emergency Department (HOSPITAL_COMMUNITY): Payer: Medicare Other

## 2014-09-13 ENCOUNTER — Encounter (HOSPITAL_COMMUNITY): Payer: Self-pay | Admitting: Emergency Medicine

## 2014-09-13 DIAGNOSIS — R7989 Other specified abnormal findings of blood chemistry: Secondary | ICD-10-CM

## 2014-09-13 DIAGNOSIS — K219 Gastro-esophageal reflux disease without esophagitis: Secondary | ICD-10-CM | POA: Diagnosis present

## 2014-09-13 DIAGNOSIS — E1140 Type 2 diabetes mellitus with diabetic neuropathy, unspecified: Secondary | ICD-10-CM | POA: Diagnosis present

## 2014-09-13 DIAGNOSIS — R062 Wheezing: Secondary | ICD-10-CM | POA: Diagnosis not present

## 2014-09-13 DIAGNOSIS — N179 Acute kidney failure, unspecified: Secondary | ICD-10-CM | POA: Diagnosis present

## 2014-09-13 DIAGNOSIS — Z7982 Long term (current) use of aspirin: Secondary | ICD-10-CM | POA: Diagnosis not present

## 2014-09-13 DIAGNOSIS — J9801 Acute bronchospasm: Secondary | ICD-10-CM

## 2014-09-13 DIAGNOSIS — E1165 Type 2 diabetes mellitus with hyperglycemia: Secondary | ICD-10-CM | POA: Diagnosis present

## 2014-09-13 DIAGNOSIS — E11319 Type 2 diabetes mellitus with unspecified diabetic retinopathy without macular edema: Secondary | ICD-10-CM | POA: Diagnosis present

## 2014-09-13 DIAGNOSIS — R1010 Upper abdominal pain, unspecified: Secondary | ICD-10-CM

## 2014-09-13 DIAGNOSIS — Z9119 Patient's noncompliance with other medical treatment and regimen: Secondary | ICD-10-CM | POA: Diagnosis present

## 2014-09-13 DIAGNOSIS — E785 Hyperlipidemia, unspecified: Secondary | ICD-10-CM | POA: Diagnosis present

## 2014-09-13 DIAGNOSIS — Z8546 Personal history of malignant neoplasm of prostate: Secondary | ICD-10-CM | POA: Diagnosis not present

## 2014-09-13 DIAGNOSIS — D860 Sarcoidosis of lung: Secondary | ICD-10-CM | POA: Diagnosis present

## 2014-09-13 DIAGNOSIS — R0609 Other forms of dyspnea: Secondary | ICD-10-CM

## 2014-09-13 DIAGNOSIS — I10 Essential (primary) hypertension: Secondary | ICD-10-CM | POA: Diagnosis present

## 2014-09-13 DIAGNOSIS — R739 Hyperglycemia, unspecified: Secondary | ICD-10-CM | POA: Insufficient documentation

## 2014-09-13 DIAGNOSIS — Z9114 Patient's other noncompliance with medication regimen: Secondary | ICD-10-CM | POA: Diagnosis present

## 2014-09-13 DIAGNOSIS — K56609 Unspecified intestinal obstruction, unspecified as to partial versus complete obstruction: Secondary | ICD-10-CM

## 2014-09-13 DIAGNOSIS — R609 Edema, unspecified: Secondary | ICD-10-CM | POA: Diagnosis present

## 2014-09-13 DIAGNOSIS — Z87891 Personal history of nicotine dependence: Secondary | ICD-10-CM

## 2014-09-13 DIAGNOSIS — R0602 Shortness of breath: Secondary | ICD-10-CM | POA: Diagnosis present

## 2014-09-13 DIAGNOSIS — K5669 Other intestinal obstruction: Secondary | ICD-10-CM | POA: Diagnosis present

## 2014-09-13 DIAGNOSIS — Z833 Family history of diabetes mellitus: Secondary | ICD-10-CM

## 2014-09-13 DIAGNOSIS — K56600 Partial intestinal obstruction, unspecified as to cause: Secondary | ICD-10-CM | POA: Diagnosis present

## 2014-09-13 DIAGNOSIS — F419 Anxiety disorder, unspecified: Secondary | ICD-10-CM | POA: Diagnosis present

## 2014-09-13 DIAGNOSIS — R109 Unspecified abdominal pain: Secondary | ICD-10-CM

## 2014-09-13 DIAGNOSIS — Z79899 Other long term (current) drug therapy: Secondary | ICD-10-CM | POA: Diagnosis not present

## 2014-09-13 DIAGNOSIS — Z886 Allergy status to analgesic agent status: Secondary | ICD-10-CM

## 2014-09-13 DIAGNOSIS — Z792 Long term (current) use of antibiotics: Secondary | ICD-10-CM | POA: Diagnosis not present

## 2014-09-13 DIAGNOSIS — R748 Abnormal levels of other serum enzymes: Secondary | ICD-10-CM

## 2014-09-13 DIAGNOSIS — D869 Sarcoidosis, unspecified: Secondary | ICD-10-CM

## 2014-09-13 HISTORY — DX: Type 2 diabetes mellitus with unspecified diabetic retinopathy without macular edema: E11.319

## 2014-09-13 LAB — URINALYSIS, ROUTINE W REFLEX MICROSCOPIC
Bilirubin Urine: NEGATIVE
Hgb urine dipstick: NEGATIVE
Ketones, ur: 40 mg/dL — AB
Leukocytes, UA: NEGATIVE
Nitrite: NEGATIVE
PH: 6.5 (ref 5.0–8.0)
Protein, ur: NEGATIVE mg/dL
Specific Gravity, Urine: 1.015 (ref 1.005–1.030)
Urobilinogen, UA: 0.2 mg/dL (ref 0.0–1.0)

## 2014-09-13 LAB — CBC WITH DIFFERENTIAL/PLATELET
Basophils Absolute: 0 10*3/uL (ref 0.0–0.1)
Basophils Relative: 0 % (ref 0–1)
Eosinophils Absolute: 0.1 10*3/uL (ref 0.0–0.7)
Eosinophils Relative: 1 % (ref 0–5)
HCT: 41.7 % (ref 39.0–52.0)
Hemoglobin: 13.4 g/dL (ref 13.0–17.0)
LYMPHS ABS: 0.7 10*3/uL (ref 0.7–4.0)
Lymphocytes Relative: 9 % — ABNORMAL LOW (ref 12–46)
MCH: 20.9 pg — ABNORMAL LOW (ref 26.0–34.0)
MCHC: 32.1 g/dL (ref 30.0–36.0)
MCV: 65.1 fL — ABNORMAL LOW (ref 78.0–100.0)
Monocytes Absolute: 0.3 10*3/uL (ref 0.1–1.0)
Monocytes Relative: 4 % (ref 3–12)
Neutro Abs: 6.5 10*3/uL (ref 1.7–7.7)
Neutrophils Relative %: 85 % — ABNORMAL HIGH (ref 43–77)
Platelets: 149 10*3/uL — ABNORMAL LOW (ref 150–400)
RBC: 6.41 MIL/uL — ABNORMAL HIGH (ref 4.22–5.81)
RDW: 15.1 % (ref 11.5–15.5)
WBC: 7.6 10*3/uL (ref 4.0–10.5)

## 2014-09-13 LAB — COMPREHENSIVE METABOLIC PANEL
ALK PHOS: 184 U/L — AB (ref 39–117)
ALT: 17 U/L (ref 0–53)
AST: 26 U/L (ref 0–37)
Albumin: 4 g/dL (ref 3.5–5.2)
Anion gap: 7 (ref 5–15)
BUN: 16 mg/dL (ref 6–23)
CALCIUM: 9.9 mg/dL (ref 8.4–10.5)
CHLORIDE: 98 mmol/L (ref 96–112)
CO2: 27 mmol/L (ref 19–32)
CREATININE: 1.42 mg/dL — AB (ref 0.50–1.35)
GFR calc Af Amer: 58 mL/min — ABNORMAL LOW (ref >90)
GFR, EST NON AFRICAN AMERICAN: 50 mL/min — AB (ref >90)
GLUCOSE: 474 mg/dL — AB (ref 70–99)
Potassium: 4.9 mmol/L (ref 3.5–5.1)
Sodium: 132 mmol/L — ABNORMAL LOW (ref 135–145)
Total Bilirubin: 1.1 mg/dL (ref 0.3–1.2)
Total Protein: 7.4 g/dL (ref 6.0–8.3)

## 2014-09-13 LAB — LIPASE, BLOOD: Lipase: 22 U/L (ref 11–59)

## 2014-09-13 LAB — GLUCOSE, CAPILLARY
GLUCOSE-CAPILLARY: 120 mg/dL — AB (ref 70–99)
Glucose-Capillary: 191 mg/dL — ABNORMAL HIGH (ref 70–99)
Glucose-Capillary: 170 mg/dL — ABNORMAL HIGH (ref 70–99)

## 2014-09-13 LAB — I-STAT CG4 LACTIC ACID, ED: Lactic Acid, Venous: 4.29 mmol/L (ref 0.5–2.0)

## 2014-09-13 LAB — URINE MICROSCOPIC-ADD ON

## 2014-09-13 MED ORDER — ONDANSETRON HCL 4 MG PO TABS: 4.0000 mg | ORAL_TABLET | Freq: Four times a day (QID) | ORAL | Status: DC | PRN

## 2014-09-13 MED ORDER — HYDRALAZINE HCL 20 MG/ML IJ SOLN: 10.0000 mg | Freq: Four times a day (QID) | INTRAMUSCULAR | Status: DC | PRN

## 2014-09-13 MED ORDER — GUAIFENESIN-DM 100-10 MG/5ML PO SYRP: 5.0000 mL | ORAL_SOLUTION | ORAL | Status: DC | PRN

## 2014-09-13 MED ORDER — ACETAMINOPHEN 325 MG PO TABS
650.0000 mg | ORAL_TABLET | Freq: Four times a day (QID) | ORAL | Status: DC | PRN
  Administered 2014-09-17: 650 mg via ORAL
  Filled 2014-09-13: qty 2

## 2014-09-13 MED ORDER — SODIUM CHLORIDE 0.9 % IV SOLN
INTRAVENOUS | Status: DC
  Administered 2014-09-13 – 2014-09-15 (×7): via INTRAVENOUS

## 2014-09-13 MED ORDER — ALBUTEROL SULFATE (2.5 MG/3ML) 0.083% IN NEBU
2.5000 mg | INHALATION_SOLUTION | RESPIRATORY_TRACT | Status: DC | PRN
  Administered 2014-09-15 – 2014-09-18 (×5): 2.5 mg via RESPIRATORY_TRACT
  Filled 2014-09-13 (×4): qty 3

## 2014-09-13 MED ORDER — MORPHINE SULFATE 4 MG/ML IJ SOLN
4.0000 mg | Freq: Once | INTRAMUSCULAR | Status: AC
  Administered 2014-09-13: 4 mg via INTRAVENOUS
  Filled 2014-09-13: qty 1

## 2014-09-13 MED ORDER — ONDANSETRON HCL 4 MG/2ML IJ SOLN
4.0000 mg | Freq: Once | INTRAMUSCULAR | Status: AC
  Administered 2014-09-13: 4 mg via INTRAVENOUS
  Filled 2014-09-13: qty 2

## 2014-09-13 MED ORDER — MORPHINE SULFATE 2 MG/ML IJ SOLN
1.0000 mg | INTRAMUSCULAR | Status: DC | PRN
  Administered 2014-09-13 – 2014-09-14 (×3): 2 mg via INTRAVENOUS
  Administered 2014-09-16: 1 mg via INTRAVENOUS
  Filled 2014-09-13 (×4): qty 1

## 2014-09-13 MED ORDER — SODIUM CHLORIDE 0.9 % IV BOLUS (SEPSIS)
1000.0000 mL | Freq: Once | INTRAVENOUS | Status: AC
  Administered 2014-09-13: 1000 mL via INTRAVENOUS

## 2014-09-13 MED ORDER — SODIUM CHLORIDE 0.9 % IV SOLN
Freq: Once | INTRAVENOUS | Status: AC
  Administered 2014-09-13: 125 mL/h via INTRAVENOUS

## 2014-09-13 MED ORDER — INSULIN ASPART 100 UNIT/ML ~~LOC~~ SOLN
5.0000 [IU] | Freq: Once | SUBCUTANEOUS | Status: AC
  Administered 2014-09-13: 5 [IU] via INTRAVENOUS
  Filled 2014-09-13: qty 1

## 2014-09-13 MED ORDER — ENOXAPARIN SODIUM 40 MG/0.4ML ~~LOC~~ SOLN
40.0000 mg | SUBCUTANEOUS | Status: DC
  Administered 2014-09-13 – 2014-09-17 (×5): 40 mg via SUBCUTANEOUS
  Filled 2014-09-13 (×6): qty 0.4

## 2014-09-13 MED ORDER — IOHEXOL 300 MG/ML  SOLN: 25.0000 mL | Freq: Once | INTRAMUSCULAR | Status: DC | PRN

## 2014-09-13 MED ORDER — ACETAMINOPHEN 650 MG RE SUPP: 650.0000 mg | Freq: Four times a day (QID) | RECTAL | Status: DC | PRN

## 2014-09-13 MED ORDER — PANTOPRAZOLE SODIUM 40 MG IV SOLR
40.0000 mg | INTRAVENOUS | Status: DC
  Administered 2014-09-13 – 2014-09-15 (×3): 40 mg via INTRAVENOUS
  Filled 2014-09-13 (×4): qty 40

## 2014-09-13 MED ORDER — ONDANSETRON HCL 4 MG/2ML IJ SOLN
4.0000 mg | Freq: Four times a day (QID) | INTRAMUSCULAR | Status: DC | PRN
  Administered 2014-09-13: 4 mg via INTRAVENOUS
  Filled 2014-09-13: qty 2

## 2014-09-13 MED ORDER — IOHEXOL 300 MG/ML  SOLN
100.0000 mL | Freq: Once | INTRAMUSCULAR | Status: AC | PRN
  Administered 2014-09-13: 100 mL via INTRAVENOUS

## 2014-09-13 MED ORDER — INSULIN GLARGINE 100 UNIT/ML ~~LOC~~ SOLN
10.0000 [IU] | Freq: Every day | SUBCUTANEOUS | Status: DC
Start: 2014-09-13 — End: 2014-09-16
  Administered 2014-09-14 – 2014-09-16 (×3): 10 [IU] via SUBCUTANEOUS
  Filled 2014-09-13 (×4): qty 0.1

## 2014-09-13 MED ORDER — INSULIN ASPART 100 UNIT/ML ~~LOC~~ SOLN
0.0000 [IU] | Freq: Three times a day (TID) | SUBCUTANEOUS | Status: DC
  Administered 2014-09-13 – 2014-09-14 (×2): 2 [IU] via SUBCUTANEOUS
  Administered 2014-09-15 (×2): 3 [IU] via SUBCUTANEOUS
  Administered 2014-09-15: 1 [IU] via SUBCUTANEOUS
  Administered 2014-09-16: 5 [IU] via SUBCUTANEOUS
  Administered 2014-09-16: 9 [IU] via SUBCUTANEOUS

## 2014-09-13 NOTE — Consult Note (Signed)
Reason for Consult:  SBO Referring Physician: Dr. Delora Fuel PCP:  [name], MD    [name] is an 64 y.o. male.  HPI: 64 y/o male with hx of SBO/VHR and prostatectomy admitted 08/21/12 with SBO.  He had a exploratory laparotomy and lysis of adhesion on 08/24/12.  He has done well till this AM he awoke with severe abdominal pain.  He had a BM yesterday, denies issues with constipation.  He was concerned he had constipation when he woke up and gave himself an enema that worked, but did nor relieve his pain.  He denies nausea or vomiting at this point.   He presented to the ED and work up shows he is afebrile, VSS BP up some now with ongoing pain.  Na 132, creatinine is 1.42, glucose 474, lactate is elevated at 4.29.  CT show: distention of small bowel loops to 3.2 cm in maximal diameter, with gradual fecalization at the lower abdomen. Distal small bowel still contains a small amount of fluid. This likely reflects some degree of small bowel dysmotility. No associated inflammation seen. No evidence for obstruction. 2. Status post prostatectomy; no evidence of recurrent mass. 3. Enlarged periesophageal and bilateral peribronchial nodes again seen. These appear stable dating back to 2007, and may reflect sarcoidosis.  We are ask to see in consult.  Past Medical History  Diagnosis Date  Diabetes mellitus diabetic retinopathy  Poor control in the past   Diabetic neuropathy   Fluid overload 09/03/2012   Post op  Hypertension   Prostate cancer   Hyperlipidemia    Ileus following gastrointestinal surgery 09/03/2012    Past Surgical History  Procedure Laterality Date  . Hernia repair    . Prostate surgery    . Shoulder surgery    . Laparotomy  08/23/2012    Procedure: EXPLORATORY LAPAROTOMY;  Surgeon: [name], DO;  Location: [facility];  Service: General;  Laterality: N/A;  exploratory laparotomy with lysis of adhesions  . Lysis of adhesion  08/23/2012    Procedure: LYSIS OF ADHESION;   Surgeon: [name], DO;  Location: [facility];  Service: General;;    Family History  Problem Relation Age of Onset  . Diabetes Mellitus II Mother     Social History:  reports that he quit smoking about 47 years ago. His smoking use included Cigarettes. He has never used smokeless tobacco. He reports that he drinks alcohol. His drug history is not on file.  Allergies:  Allergies  Allergen Reactions  . Naproxen Anaphylaxis    REACTION: Throat swells and cannot breathe    Medications:  Prior to Admission medications   Medication Sig Start Date End Date Taking? Authorizing Provider  amoxicillin-clavulanate (AUGMENTIN) 875-125 MG per tablet Take 1 tablet by mouth 2 (two) times daily. 08/06/13   Historical Provider, MD  aspirin 81 MG chewable tablet Chew 1 tablet (81 mg total) by mouth daily. Patient not taking: Reported on 09/13/2014 09/04/12   Earnstine Regal, PA-C  cloNIDine (CATAPRES - DOSED IN MG/24 HR) 0.2 mg/24hr patch Place 0.2 mg onto the skin once a week. On thursdays 07/17/13   Historical Provider, MD  gabapentin (NEURONTIN) 300 MG capsule Take 300 mg by mouth 2 (two) times daily.    Historical Provider, MD  glipiZIDE (GLUCOTROL) 5 MG tablet Take 1 tablet (5 mg total) by mouth daily before breakfast. Patient not taking: Reported on 09/13/2014 09/04/12   Earnstine Regal, PA-C  insulin glargine (LANTUS) 100 UNIT/ML injection Inject 5 Units  into the skin at bedtime. Patient not taking: Reported on 09/13/2014 09/04/12   Earnstine Regal, PA-C  lisinopril (PRINIVIL,ZESTRIL) 10 MG tablet Take 1 tablet by mouth daily. 07/17/13   Historical Provider, MD  metFORMIN (GLUCOPHAGE) 500 MG tablet Take 1,000 mg by mouth daily.    Historical Provider, MD  Multiple Vitamin (MULTIVITAMIN WITH MINERALS) TABS tablet Take 1 tablet by mouth daily.    Historical Provider, MD  olmesartan (BENICAR) 20 MG tablet One half daily Patient not taking: Reported on 09/13/2014 01/16/13   Tammy S Parrett, NP  omeprazole  (PRILOSEC) 20 MG capsule Take 20 mg by mouth daily.    Historical Provider, MD    Continuous: . sodium chloride 1,000 mL (09/13/14 0737)   QMG:NOIBBCW Anti-infectives    None      Results for orders placed or performed during the hospital encounter of 09/13/14 (from the past 48 hour(s))  CBC with Differential     Status: Abnormal   Collection Time: 09/13/14  4:13 AM  Result Value Ref Range   WBC 7.6 4.0 - 10.5 K/uL   RBC 6.41 (H) 4.22 - 5.81 MIL/uL   Hemoglobin 13.4 13.0 - 17.0 g/dL   HCT 41.7 39.0 - 52.0 %   MCV 65.1 (L) 78.0 - 100.0 fL   MCH 20.9 (L) 26.0 - 34.0 pg   MCHC 32.1 30.0 - 36.0 g/dL   RDW 15.1 11.5 - 15.5 %   Platelets 149 (L) 150 - 400 K/uL   Neutrophils Relative % 85 (H) 43 - 77 %   Neutro Abs 6.5 1.7 - 7.7 K/uL   Lymphocytes Relative 9 (L) 12 - 46 %   Lymphs Abs 0.7 0.7 - 4.0 K/uL   Monocytes Relative 4 3 - 12 %   Monocytes Absolute 0.3 0.1 - 1.0 K/uL   Eosinophils Relative 1 0 - 5 %   Eosinophils Absolute 0.1 0.0 - 0.7 K/uL   Basophils Relative 0 0 - 1 %   Basophils Absolute 0.0 0.0 - 0.1 K/uL  Comprehensive metabolic panel     Status: Abnormal   Collection Time: 09/13/14  4:13 AM  Result Value Ref Range   Sodium 132 (L) 135 - 145 mmol/L   Potassium 4.9 3.5 - 5.1 mmol/L   Chloride 98 96 - 112 mmol/L   CO2 27 19 - 32 mmol/L   Glucose, Bld 474 (H) 70 - 99 mg/dL   BUN 16 6 - 23 mg/dL   Creatinine, Ser 1.42 (H) 0.50 - 1.35 mg/dL   Calcium 9.9 8.4 - 10.5 mg/dL   Total Protein 7.4 6.0 - 8.3 g/dL   Albumin 4.0 3.5 - 5.2 g/dL   AST 26 0 - 37 U/L   ALT 17 0 - 53 U/L   Alkaline Phosphatase 184 (H) 39 - 117 U/L   Total Bilirubin 1.1 0.3 - 1.2 mg/dL   GFR calc non Af Amer 50 (L) >90 mL/min   GFR calc Af Amer 58 (L) >90 mL/min    Comment: (NOTE) The eGFR has been calculated using the CKD EPI equation. This calculation has not been validated in all clinical situations. eGFR's persistently <90 mL/min signify possible Chronic Kidney Disease.    Anion gap 7  5 - 15  Lipase, blood     Status: None   Collection Time: 09/13/14  4:13 AM  Result Value Ref Range   Lipase 22 11 - 59 U/L  Urinalysis, Routine w reflex microscopic     Status: Abnormal  Collection Time: 09/13/14  5:46 AM  Result Value Ref Range   Color, Urine YELLOW YELLOW   APPearance CLEAR CLEAR   Specific Gravity, Urine 1.015 1.005 - 1.030   pH 6.5 5.0 - 8.0   Glucose, UA >1000 (A) NEGATIVE mg/dL   Hgb urine dipstick NEGATIVE NEGATIVE   Bilirubin Urine NEGATIVE NEGATIVE   Ketones, ur 40 (A) NEGATIVE mg/dL   Protein, ur NEGATIVE NEGATIVE mg/dL   Urobilinogen, UA 0.2 0.0 - 1.0 mg/dL   Nitrite NEGATIVE NEGATIVE   Leukocytes, UA NEGATIVE NEGATIVE  Urine microscopic-add on     Status: None   Collection Time: 09/13/14  5:46 AM  Result Value Ref Range   WBC, UA 0-2 <3 WBC/hpf   RBC / HPF 0-2 <3 RBC/hpf  I-Stat CG4 Lactic Acid, ED     Status: Abnormal   Collection Time: 09/13/14  5:52 AM  Result Value Ref Range   Lactic Acid, Venous 4.29 (HH) 0.5 - 2.0 mmol/L   Comment NOTIFIED PHYSICIAN     Ct Abdomen Pelvis W Contrast  09/13/2014   CLINICAL DATA:  Diffuse lower abdominal pain, with nausea and constipation. Initial encounter.  EXAM: CT ABDOMEN AND PELVIS WITH CONTRAST  TECHNIQUE: Multidetector CT imaging of the abdomen and pelvis was performed using the standard protocol following bolus administration of intravenous contrast.  CONTRAST:  139m OMNIPAQUE IOHEXOL 300 MG/ML  SOLN  COMPARISON:  CT of the abdomen and pelvis from 08/30/2012, and CT of the chest performed 06/20/2006  FINDINGS: The visualized lung bases are clear. Enlarged periesophageal nodes and bilateral peribronchial nodes are again seen. These appear stable from CT of the chest dating back to 2007, and may reflect sarcoidosis. Would correlate with patient's clinical diagnoses.  The liver and spleen are unremarkable in appearance. The gallbladder is within normal limits. The pancreas and adrenal glands are unremarkable.   Mild nonspecific perinephric stranding is noted bilaterally. The kidneys are otherwise unremarkable. There is no evidence of hydronephrosis. No renal or ureteral stones are seen.  Note is made of slight distention of small-bowel loops to 3.2 cm in maximal diameter, with gradual fecalization at the lower abdomen. Distal small bowel still contains a small amount of fluid. This likely reflects some degree of small bowel dysmotility. No associated inflammation is seen.  No free fluid is identified. The stomach is within normal limits. No acute vascular abnormalities are seen.  The appendix is normal in caliber, without evidence for appendicitis. The colon is unremarkable in appearance.  The bladder is mildly distended and grossly unremarkable. The patient is status post prostatectomy, with surrounding clips. There is no evidence of recurrent mass in this region. No inguinal lymphadenopathy is seen. The visualized inguinal nodes are borderline normal in size.  No acute osseous abnormalities are identified. Vacuum phenomenon is noted at L3-L4 and L4-L5, with endplate sclerotic change seen at L4-L5. Mild underlying facet disease is noted at the lower lumbar spine.  IMPRESSION: 1. Slight distention of small bowel loops to 3.2 cm in maximal diameter, with gradual fecalization at the lower abdomen. Distal small bowel still contains a small amount of fluid. This likely reflects some degree of small bowel dysmotility. No associated inflammation seen. No evidence for obstruction. 2. Status post prostatectomy; no evidence of recurrent mass. 3. Enlarged periesophageal and bilateral peribronchial nodes again seen. These appear stable dating back to 2007, and may reflect sarcoidosis. Would correlate with the patient's clinical diagnoses.   Electronically Signed   By: JFrancoise SchaumannD.  On: 09/13/2014 07:35    Review of Systems  Constitutional: Negative.   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  Eyes: Positive for blurred vision.  Respiratory: Negative.   Cardiovascular: Negative.   Gastrointestinal: Positive for heartburn and abdominal pain. Negative for nausea, vomiting, diarrhea, constipation, blood in stool and melena.  Genitourinary: Negative.   Musculoskeletal: Positive for back pain.  Skin: Negative.   Neurological: Negative.   Endo/Heme/Allergies: Bruises/bleeds easily.  Psychiatric/Behavioral: Negative.    Blood pressure 148/77, pulse 79, temperature 98.4 F (36.9 C), temperature source Oral, resp. rate 18, height '5\' 6"'  (1.676 m), weight 72.576 kg (160 lb), SpO2 100 %. Physical Exam  Constitutional: He is oriented to person, place, and time. He appears well-developed and well-nourished. He appears distressed (still having abdominal pain).  HENT:  Head: Normocephalic and atraumatic.  Nose: Nose normal.  Eyes: Conjunctivae and EOM are normal. Right eye exhibits no discharge. Left eye exhibits no discharge. No scleral icterus.  Neck: Normal range of motion. Neck supple. No JVD present. No tracheal deviation present. No thyromegaly present.  Cardiovascular: Normal rate, regular rhythm and normal heart sounds.   No murmur heard. Respiratory: Effort normal and breath sounds normal. No respiratory distress. He has no wheezes. He has no rales. He exhibits no tenderness.  GI: He exhibits distension. He exhibits no mass. There is tenderness. There is no rebound and no guarding.  Very distended few hyperactive BS  Musculoskeletal: He exhibits no edema or tenderness.  Lymphadenopathy:    He has no cervical adenopathy.  Neurological: He is alert and oriented to person, place, and time. No cranial nerve deficit.  Skin: Skin is warm and dry. No rash noted. He is not diaphoretic. No erythema. No pallor.  Psychiatric: He has a normal mood and affect. His behavior is normal. Judgment and thought content normal.    Assessment/Plan: 1.  Recurrent SBO, s/p laparotomy and lysis  of adhesion 08/24/12/post op ileus 2.  Hx of prostatectomy and VHR 1980's 3.  AODM with hx of poor control 4.  Diabetic neuropathy and retinopathy 5.  Hypertension 6.  Hx of prostate Cancer 7.  Hx of hyperlipidemia 8.  Renal insuffiencey, His renal function was normal in Jan 2014 admit   Plan:  He is very distended and still having pain.  No nausea or vomiting.  He had a BM yesterday and this AM after taking enema.  Stomach was not distended.  If he has any nausea I would insert NG, but for now we can just watch.  Will defer to Medicine for hydration and watch his labs.  Bowel rest and hydration for right now.    Christie Viscomi 09/13/2014, 8:33 AM

## 2014-09-13 NOTE — ED Notes (Signed)
Dr. Roxanne Mins back at the bedside.

## 2014-09-13 NOTE — ED Notes (Signed)
This RN and tech at bedside to place NG tube.

## 2014-09-13 NOTE — ED Notes (Signed)
Dr Roxanne Mins given a copy of lactic acid results 4.29

## 2014-09-13 NOTE — H&P (Signed)
PATIENT DETAILS Name: [name] Age: 64 y.o. Sex: male Date of Birth: 10/15/1950 Admit Date: 09/13/2014 [id number] E, MD   CHIEF COMPLAINT:  Abdominal pain since 1 AM this morning  HPI: [name] is a 64 y.o. male with a Past Medical History of pulmonary sarcoidosis, prior history of small bowel obstruction requiring exploratory laparotomy and lysis of adhesions in 2014, diabetes (noncompliant with insulin), hypertension who presents today with the above noted complaint. Per patient, he woke up around 1 AM this morning with severe abdominal pain. Patient describes the pain mostly in his lower abdomen, 10/10 at its severity. He thought he was constipated, and proceeded to give himself an enema with success, however it did not relieve the abdominal pain. There was no history of vomiting. Because of persistent severe pain, patient was brought to the emergency room. CT scan of the abdomen showed distention of small bowel loops, with fecal exertion of the lower abdomen. General surgery was consulted and recommended supportive care, I was asked to admit this patient for further evaluation and treatment. Patient denies any fever, headache, chest, shortness of breath, nausea, vomiting or diarrhea.   ALLERGIES:   Allergies  Allergen Reactions  . Naproxen Anaphylaxis    REACTION: Throat swells and cannot breathe    PAST MEDICAL HISTORY: Past Medical History  Diagnosis Date  . Diabetes mellitus without complication   . Cancer   . Prostate cancer   . Fluid overload 09/03/2012    Post op  . Ileus following gastrointestinal surgery 09/03/2012  . Diabetic retinopathy associated with type 2 diabetes mellitus 09/13/2014    He works for [employer] for [employer]    PAST SURGICAL HISTORY: Past Surgical History  Procedure Laterality Date  . Hernia repair    . Prostate surgery    . Shoulder surgery    . Laparotomy  08/23/2012    Procedure: EXPLORATORY LAPAROTOMY;   Surgeon: [name], DO;  Location: [facility];  Service: General;  Laterality: N/A;  exploratory laparotomy with lysis of adhesions  . Lysis of adhesion  08/23/2012    Procedure: LYSIS OF ADHESION;  Surgeon: [name], DO;  Location: [facility];  Service: General;;    MEDICATIONS AT HOME: Prior to Admission medications   Medication Sig Start Date End Date Taking? Authorizing Provider  amoxicillin-clavulanate (AUGMENTIN) 875-125 MG per tablet Take 1 tablet by mouth 2 (two) times daily. 08/06/13   Historical Provider, MD  aspirin 81 MG chewable tablet Chew 1 tablet (81 mg total) by mouth daily. Patient not taking: Reported on 09/13/2014 09/04/12   Earnstine Regal, PA-C  cloNIDine (CATAPRES - DOSED IN MG/24 HR) 0.2 mg/24hr patch Place 0.2 mg onto the skin once a week. On thursdays 07/17/13   Historical Provider, MD  gabapentin (NEURONTIN) 300 MG capsule Take 300 mg by mouth 2 (two) times daily.    Historical Provider, MD  glipiZIDE (GLUCOTROL) 5 MG tablet Take 1 tablet (5 mg total) by mouth daily before breakfast. Patient not taking: Reported on 09/13/2014 09/04/12   Earnstine Regal, PA-C  insulin glargine (LANTUS) 100 UNIT/ML injection Inject 5 Units into the skin at bedtime. Patient not taking: Reported on 09/13/2014 09/04/12   Earnstine Regal, PA-C  lisinopril (PRINIVIL,ZESTRIL) 10 MG tablet Take 1 tablet by mouth daily. 07/17/13   Historical Provider, MD  metFORMIN (GLUCOPHAGE) 500 MG tablet Take 1,000 mg by mouth daily.    Historical Provider, MD  Multiple Vitamin (MULTIVITAMIN WITH MINERALS) TABS  tablet Take 1 tablet by mouth daily.    Historical Provider, MD  olmesartan (BENICAR) 20 MG tablet One half daily Patient not taking: Reported on 09/13/2014 01/16/13   Tammy S Parrett, NP  omeprazole (PRILOSEC) 20 MG capsule Take 20 mg by mouth daily.    Historical Provider, MD    FAMILY HISTORY: Family History  Problem Relation Age of Onset  . Diabetes Mellitus II Mother    SOCIAL HISTORY:  reports  that he quit smoking about 47 years ago. His smoking use included Cigarettes. He has never used smokeless tobacco. He reports that he drinks alcohol. His drug history is not on file.  REVIEW OF SYSTEMS:  Constitutional:   No  weight loss, night sweats,  Fevers, chills, fatigue.  HEENT:    No headaches, Difficulty swallowing,Tooth/dental problems,Sore throat,  No sneezing, itching, ear ache, nasal congestion, post nasal drip,   Cardio-vascular: No chest pain,  Orthopnea, PND, swelling in lower extremities, anasarca  GI:  No heartburn, indigestion, vomiting, diarrhea, change in bowel habits, loss of appetite  Resp: No shortness of breath with exertion or at rest.  No excess mucus, no productive cough, No non-productive cough,  No coughing up of blood.No change in color of mucus.No wheezing.No chest wall deformity  Skin:  no rash or lesions.  GU:  no dysuria, change in color of urine, no urgency or frequency.  No flank pain.  Musculoskeletal: No joint pain or swelling.  No decreased range of motion.  No back pain.  Psych: No change in mood or affect. No depression or anxiety.  No memory loss.   PHYSICAL EXAM: Blood pressure 148/77, pulse 75, temperature 98.4 F (36.9 C), temperature source Oral, resp. rate 18, height 5\' 6"  (1.676 m), weight 72.576 kg (160 lb), SpO2 94 %.  General appearance :Awake, alert, not in any distress. Speech Clear. Not toxic Looking HEENT: Atraumatic and Normocephalic, pupils equally reactive to light and accomodation Neck: supple, no JVD. No cervical lymphadenopathy.  Chest:Good air entry bilaterally, no added sounds  CVS: S1 S2 regular, no murmurs.  Abdomen: Bowel sounds present, tender in the lower abdomen with some guarding however no rebound. Extremities: B/L Lower Ext shows no edema, both legs are warm to touch Neurology: Awake alert, and oriented X 3, CN II-XII intact, Non focal Skin:No Rash Wounds:N/A  LABS ON ADMISSION:   Recent Labs   09/13/14 0413  NA 132*  K 4.9  CL 98  CO2 27  GLUCOSE 474*  BUN 16  CREATININE 1.42*  CALCIUM 9.9    Recent Labs  09/13/14 0413  AST 26  ALT 17  ALKPHOS 184*  BILITOT 1.1  PROT 7.4  ALBUMIN 4.0    Recent Labs  09/13/14 0413  LIPASE 22    Recent Labs  09/13/14 0413  WBC 7.6  NEUTROABS 6.5  HGB 13.4  HCT 41.7  MCV 65.1*  PLT 149*   No results for input(s): CKTOTAL, CKMB, CKMBINDEX, TROPONINI in the last 72 hours. No results for input(s): DDIMER in the last 72 hours. Invalid input(s): POCBNP   RADIOLOGIC STUDIES ON ADMISSION: Ct Abdomen Pelvis W Contrast  09/13/2014   CLINICAL DATA:  Diffuse lower abdominal pain, with nausea and constipation. Initial encounter.  EXAM: CT ABDOMEN AND PELVIS WITH CONTRAST  TECHNIQUE: Multidetector CT imaging of the abdomen and pelvis was performed using the standard protocol following bolus administration of intravenous contrast.  CONTRAST:  118mL OMNIPAQUE IOHEXOL 300 MG/ML  SOLN  COMPARISON:  CT of the  abdomen and pelvis from 08/30/2012, and CT of the chest performed 06/20/2006  FINDINGS: The visualized lung bases are clear. Enlarged periesophageal nodes and bilateral peribronchial nodes are again seen. These appear stable from CT of the chest dating back to 2007, and may reflect sarcoidosis. Would correlate with patient's clinical diagnoses.  The liver and spleen are unremarkable in appearance. The gallbladder is within normal limits. The pancreas and adrenal glands are unremarkable.  Mild nonspecific perinephric stranding is noted bilaterally. The kidneys are otherwise unremarkable. There is no evidence of hydronephrosis. No renal or ureteral stones are seen.  Note is made of slight distention of small-bowel loops to 3.2 cm in maximal diameter, with gradual fecalization at the lower abdomen. Distal small bowel still contains a small amount of fluid. This likely reflects some degree of small bowel dysmotility. No associated inflammation is  seen.  No free fluid is identified. The stomach is within normal limits. No acute vascular abnormalities are seen.  The appendix is normal in caliber, without evidence for appendicitis. The colon is unremarkable in appearance.  The bladder is mildly distended and grossly unremarkable. The patient is status post prostatectomy, with surrounding clips. There is no evidence of recurrent mass in this region. No inguinal lymphadenopathy is seen. The visualized inguinal nodes are borderline normal in size.  No acute osseous abnormalities are identified. Vacuum phenomenon is noted at L3-L4 and L4-L5, with endplate sclerotic change seen at L4-L5. Mild underlying facet disease is noted at the lower lumbar spine.  IMPRESSION: 1. Slight distention of small bowel loops to 3.2 cm in maximal diameter, with gradual fecalization at the lower abdomen. Distal small bowel still contains a small amount of fluid. This likely reflects some degree of small bowel dysmotility. No associated inflammation seen. No evidence for obstruction. 2. Status post prostatectomy; no evidence of recurrent mass. 3. Enlarged periesophageal and bilateral peribronchial nodes again seen. These appear stable dating back to 2007, and may reflect sarcoidosis. Would correlate with the patient's clinical diagnoses.   Electronically Signed   By: Garald Balding M.D.   On: 09/13/2014 07:35     EKG: Independently reviewed. Pending  ASSESSMENT AND PLAN: Present on Admission:  . Abdominal pain: Suspect early bowel obstruction. Keep nothing by mouth, IV fluids, if develops abdominal distention of persistent vomiting will place NG tube. General surgery consulting, defer further care to Gen. surgery.   . Uncontrolled type II 2 diabetes mellitus: Patient is supposed to be on insulin, he has been noncompliant with insulin only takes oral medications. CBGs significantly elevated, will start Lantus 10 units, and place on SSI. Check A1c, resume oral medications when  able.  . Acute renal insufficiency: Likely prerenal, hydrate and follow.  . Essential hypertension: Hold all oral medications due to nothing by mouth status, will place on as needed IV hydralazine. Follow, and titrate accordingly.   . Sarcoidosis: Follows with Dr. Melvyn Novas. Has some peri-esophageal and bilateral herpetic bronchial node seen on CT, these appear to be stable, further follow-up deferred to the outpatient setting  Further plan will depend as patient's clinical course evolves and further radiologic and laboratory data become available. Patient will be monitored closely.  Above noted plan was discussed with patient/spouse, they were in agreement.   DVT Prophylaxis: Prophylactic Lovenox   Code Status: Full Code  Disposition Plan:Home when stable  Total time spent for admission equals 45 minutes.  [facility] Pager [phone]  If 7PM-7AM, please contact night-coverage [email or web address] Password [email or web address] 09/13/2014, 9:07 AM

## 2014-09-13 NOTE — ED Notes (Signed)
C/o lower abd pain that woke him up from sleeping just pta.  Denies nausea, vomiting, and diarrhea.

## 2014-09-13 NOTE — ED Notes (Signed)
Attempted report 

## 2014-09-13 NOTE — ED Notes (Signed)
General surgery PA at the bedside.

## 2014-09-13 NOTE — ED Provider Notes (Signed)
CSN: [id number]     Arrival date & time 09/13/14  [id number] History   First MD Initiated Contact with Patient 09/13/14 [phone]     Chief Complaint  Patient presents with  . Abdominal Pain     (Consider location/radiation/quality/duration/timing/severity/associated sxs/prior Treatment) Patient is a 64 y.o. male presenting with abdominal pain. The history is provided by the patient.  Abdominal Pain He woke up at about 2:30 AM with severe pain across the lower abdomen. He rates pain at 7/10. There is associated nausea but no vomiting. He thought he may have been constipated so he gave himself an enema which did produce a fair amount of stool but did not help his pain. He denies fever, chills, sweats. He has a history of small bowel obstruction and this feels similar.  Past Medical History  Diagnosis Date  . Diabetes mellitus without complication   . Cancer   . Prostate cancer   . Fluid overload 09/03/2012    Post op  . Ileus following gastrointestinal surgery 09/03/2012   Past Surgical History  Procedure Laterality Date  . Hernia repair    . Prostate surgery    . Shoulder surgery    . Laparotomy  08/23/2012    Procedure: EXPLORATORY LAPAROTOMY;  Surgeon: [name], DO;  Location: [facility];  Service: General;  Laterality: N/A;  exploratory laparotomy with lysis of adhesions  . Lysis of adhesion  08/23/2012    Procedure: LYSIS OF ADHESION;  Surgeon: [name], DO;  Location: [facility];  Service: General;;   Family History  Problem Relation Age of Onset  . Diabetes Mellitus II Mother    History  Substance Use Topics  . Smoking status: Former Smoker    Types: Cigarettes    Quit date: 11/09/1966  . Smokeless tobacco: Never Used  . Alcohol Use: Yes     Comment: SPARINGLY    Review of Systems  Gastrointestinal: Positive for abdominal pain.  All other systems reviewed and are negative.     Allergies  Naproxen  Home Medications   Prior to Admission medications   Medication Sig  Start Date End Date Taking? Authorizing Provider  amoxicillin-clavulanate (AUGMENTIN) 875-125 MG per tablet Take 1 tablet by mouth 2 (two) times daily. 08/06/13   Historical Provider, MD  aspirin 81 MG chewable tablet Chew 1 tablet (81 mg total) by mouth daily. 09/04/12   Earnstine Regal, PA-C  cloNIDine (CATAPRES - DOSED IN MG/24 HR) 0.2 mg/24hr patch Place 0.2 mg onto the skin once a week. On thursdays 07/17/13   Historical Provider, MD  gabapentin (NEURONTIN) 300 MG capsule Take 300 mg by mouth 2 (two) times daily.    Historical Provider, MD  glipiZIDE (GLUCOTROL) 5 MG tablet Take 1 tablet (5 mg total) by mouth daily before breakfast. 09/04/12   Earnstine Regal, PA-C  insulin glargine (LANTUS) 100 UNIT/ML injection Inject 5 Units into the skin at bedtime. 09/04/12   Earnstine Regal, PA-C  lisinopril (PRINIVIL,ZESTRIL) 10 MG tablet Take 1 tablet by mouth daily. 07/17/13   Historical Provider, MD  metFORMIN (GLUCOPHAGE) 500 MG tablet Take 1,000 mg by mouth daily.    Historical Provider, MD  Multiple Vitamin (MULTIVITAMIN WITH MINERALS) TABS tablet Take 1 tablet by mouth daily.    Historical Provider, MD  olmesartan (BENICAR) 20 MG tablet One half daily 01/16/13   Tammy S Parrett, NP  omeprazole (PRILOSEC) 20 MG capsule Take 20 mg by mouth daily.    Historical Provider, MD   BP 160/68 mmHg  Pulse 82  Temp(Src) 98.4 F (36.9 C) (Oral)  Resp 18  Ht 5\' 6"  (1.676 m)  Wt 160 lb (72.576 kg)  BMI 25.84 kg/m2  SpO2 99% Physical Exam  Nursing note and vitals reviewed.  64 year old male, resting comfortably and in no acute distress. Vital signs are significant for hypertension. Oxygen saturation is 99%, which is normal. Head is normocephalic and atraumatic. PERRLA, EOMI. Oropharynx is clear. Neck is nontender and supple without adenopathy or JVD. Back is nontender and there is no CVA tenderness. Lungs are clear without rales, wheezes, or rhonchi. Chest is nontender. Heart has regular rate and rhythm  without murmur. Abdomen is mild disc distended, soft, with moderate tenderness across the suprapubic area. There are no masses or hepatosplenomegaly and peristalsis is hypoactive. Extremities have no cyanosis or edema, full range of motion is present. Skin is warm and dry without rash. Neurologic: Mental status is normal, cranial nerves are intact, there are no motor or sensory deficits.  ED Course  Procedures (including critical care time) Labs Review Results for orders placed or performed during the hospital encounter of 09/13/14  CBC with Differential  Result Value Ref Range   WBC 7.6 4.0 - 10.5 K/uL   RBC 6.41 (H) 4.22 - 5.81 MIL/uL   Hemoglobin 13.4 13.0 - 17.0 g/dL   HCT 41.7 39.0 - 52.0 %   MCV 65.1 (L) 78.0 - 100.0 fL   MCH 20.9 (L) 26.0 - 34.0 pg   MCHC 32.1 30.0 - 36.0 g/dL   RDW 15.1 11.5 - 15.5 %   Platelets 149 (L) 150 - 400 K/uL   Neutrophils Relative % 85 (H) 43 - 77 %   Neutro Abs 6.5 1.7 - 7.7 K/uL   Lymphocytes Relative 9 (L) 12 - 46 %   Lymphs Abs 0.7 0.7 - 4.0 K/uL   Monocytes Relative 4 3 - 12 %   Monocytes Absolute 0.3 0.1 - 1.0 K/uL   Eosinophils Relative 1 0 - 5 %   Eosinophils Absolute 0.1 0.0 - 0.7 K/uL   Basophils Relative 0 0 - 1 %   Basophils Absolute 0.0 0.0 - 0.1 K/uL  Comprehensive metabolic panel  Result Value Ref Range   Sodium 132 (L) 135 - 145 mmol/L   Potassium 4.9 3.5 - 5.1 mmol/L   Chloride 98 96 - 112 mmol/L   CO2 27 19 - 32 mmol/L   Glucose, Bld 474 (H) 70 - 99 mg/dL   BUN 16 6 - 23 mg/dL   Creatinine, Ser 1.42 (H) 0.50 - 1.35 mg/dL   Calcium 9.9 8.4 - 10.5 mg/dL   Total Protein 7.4 6.0 - 8.3 g/dL   Albumin 4.0 3.5 - 5.2 g/dL   AST 26 0 - 37 U/L   ALT 17 0 - 53 U/L   Alkaline Phosphatase 184 (H) 39 - 117 U/L   Total Bilirubin 1.1 0.3 - 1.2 mg/dL   GFR calc non Af Amer 50 (L) >90 mL/min   GFR calc Af Amer 58 (L) >90 mL/min   Anion gap 7 5 - 15  Lipase, blood  Result Value Ref Range   Lipase 22 11 - 59 U/L  Urinalysis,  Routine w reflex microscopic  Result Value Ref Range   Color, Urine YELLOW YELLOW   APPearance CLEAR CLEAR   Specific Gravity, Urine 1.015 1.005 - 1.030   pH 6.5 5.0 - 8.0   Glucose, UA >1000 (A) NEGATIVE mg/dL   Hgb urine dipstick NEGATIVE NEGATIVE  Bilirubin Urine NEGATIVE NEGATIVE   Ketones, ur 40 (A) NEGATIVE mg/dL   Protein, ur NEGATIVE NEGATIVE mg/dL   Urobilinogen, UA 0.2 0.0 - 1.0 mg/dL   Nitrite NEGATIVE NEGATIVE   Leukocytes, UA NEGATIVE NEGATIVE  Urine microscopic-add on  Result Value Ref Range   WBC, UA 0-2 <3 WBC/hpf   RBC / HPF 0-2 <3 RBC/hpf  I-Stat CG4 Lactic Acid, ED  Result Value Ref Range   Lactic Acid, Venous 4.29 (HH) 0.5 - 2.0 mmol/L   Comment NOTIFIED PHYSICIAN    Imaging Review Ct Abdomen Pelvis W Contrast  09/13/2014   CLINICAL DATA:  Diffuse lower abdominal pain, with nausea and constipation. Initial encounter.  EXAM: CT ABDOMEN AND PELVIS WITH CONTRAST  TECHNIQUE: Multidetector CT imaging of the abdomen and pelvis was performed using the standard protocol following bolus administration of intravenous contrast.  CONTRAST:  170mL OMNIPAQUE IOHEXOL 300 MG/ML  SOLN  COMPARISON:  CT of the abdomen and pelvis from 08/30/2012, and CT of the chest performed 06/20/2006  FINDINGS: The visualized lung bases are clear. Enlarged periesophageal nodes and bilateral peribronchial nodes are again seen. These appear stable from CT of the chest dating back to 2007, and may reflect sarcoidosis. Would correlate with patient's clinical diagnoses.  The liver and spleen are unremarkable in appearance. The gallbladder is within normal limits. The pancreas and adrenal glands are unremarkable.  Mild nonspecific perinephric stranding is noted bilaterally. The kidneys are otherwise unremarkable. There is no evidence of hydronephrosis. No renal or ureteral stones are seen.  Note is made of slight distention of small-bowel loops to 3.2 cm in maximal diameter, with gradual fecalization at the  lower abdomen. Distal small bowel still contains a small amount of fluid. This likely reflects some degree of small bowel dysmotility. No associated inflammation is seen.  No free fluid is identified. The stomach is within normal limits. No acute vascular abnormalities are seen.  The appendix is normal in caliber, without evidence for appendicitis. The colon is unremarkable in appearance.  The bladder is mildly distended and grossly unremarkable. The patient is status post prostatectomy, with surrounding clips. There is no evidence of recurrent mass in this region. No inguinal lymphadenopathy is seen. The visualized inguinal nodes are borderline normal in size.  No acute osseous abnormalities are identified. Vacuum phenomenon is noted at L3-L4 and L4-L5, with endplate sclerotic change seen at L4-L5. Mild underlying facet disease is noted at the lower lumbar spine.  IMPRESSION: 1. Slight distention of small bowel loops to 3.2 cm in maximal diameter, with gradual fecalization at the lower abdomen. Distal small bowel still contains a small amount of fluid. This likely reflects some degree of small bowel dysmotility. No associated inflammation seen. No evidence for obstruction. 2. Status post prostatectomy; no evidence of recurrent mass. 3. Enlarged periesophageal and bilateral peribronchial nodes again seen. These appear stable dating back to 2007, and may reflect sarcoidosis. Would correlate with the patient's clinical diagnoses.   Electronically Signed   By: Garald Balding M.D.   On: 09/13/2014 07:35   Images viewed by me.  MDM   Final diagnoses:  Abdominal pain, unspecified abdominal location  Elevated lactic acid level  Elevated alkaline phosphatase level    Abdominal pain of uncertain cause. Old records are reviewed and he had a laparotomy with lysis of adhesions for small bowel obstruction 2 years ago. He is at high risk for recurrent small bowel obstruction and will be sent for CT scan to evaluate  this.  CT is read by radiologist has slight distention of small bowel loops but no obstruction. I suspect that actually has a partial small bowel obstruction. Lactic acid level is mildly elevated at 4.2 and is given aggressive IV hydration. Initial dose of morphine did not give him adequate pain relief and he had a second dose and is feeling much better. He will need to be admitted for observation. Cases been discussed with Dr. Gershon Crane of general surgery agrees to see the patient in consultation and call has been placed to hospitalist to arrange hospital admission.  Delora Fuel, MD [id number]

## 2014-09-13 NOTE — ED Notes (Signed)
NG tube placed x 3 attempts. Pt tolerated well.

## 2014-09-14 ENCOUNTER — Inpatient Hospital Stay (HOSPITAL_COMMUNITY): Payer: Medicare Other

## 2014-09-14 DIAGNOSIS — E1139 Type 2 diabetes mellitus with other diabetic ophthalmic complication: Secondary | ICD-10-CM

## 2014-09-14 DIAGNOSIS — R1033 Periumbilical pain: Secondary | ICD-10-CM

## 2014-09-14 DIAGNOSIS — K5669 Other intestinal obstruction: Secondary | ICD-10-CM

## 2014-09-14 LAB — GLUCOSE, CAPILLARY
GLUCOSE-CAPILLARY: 123 mg/dL — AB (ref 70–99)
GLUCOSE-CAPILLARY: 174 mg/dL — AB (ref 70–99)
Glucose-Capillary: 107 mg/dL — ABNORMAL HIGH (ref 70–99)
Glucose-Capillary: 105 mg/dL — ABNORMAL HIGH (ref 70–99)
Glucose-Capillary: 96 mg/dL (ref 70–99)
Glucose-Capillary: 194 mg/dL — ABNORMAL HIGH (ref 70–99)

## 2014-09-14 LAB — BASIC METABOLIC PANEL
Anion gap: 6 (ref 5–15)
BUN: 12 mg/dL (ref 6–23)
CO2: 23 mmol/L (ref 19–32)
CREATININE: 1.32 mg/dL (ref 0.50–1.35)
Calcium: 8.6 mg/dL (ref 8.4–10.5)
Chloride: 108 mmol/L (ref 96–112)
GFR calc Af Amer: 64 mL/min — ABNORMAL LOW (ref >90)
GFR calc non Af Amer: 55 mL/min — ABNORMAL LOW (ref >90)
Glucose, Bld: 124 mg/dL — ABNORMAL HIGH (ref 70–99)
Potassium: 4.3 mmol/L (ref 3.5–5.1)
SODIUM: 137 mmol/L (ref 135–145)

## 2014-09-14 LAB — CBC
HCT: 39.9 % (ref 39.0–52.0)
HEMOGLOBIN: 12.4 g/dL — AB (ref 13.0–17.0)
MCH: 20.6 pg — AB (ref 26.0–34.0)
MCHC: 31.1 g/dL (ref 30.0–36.0)
MCV: 66.2 fL — AB (ref 78.0–100.0)
Platelets: 142 10*3/uL — ABNORMAL LOW (ref 150–400)
RBC: 6.03 MIL/uL — ABNORMAL HIGH (ref 4.22–5.81)
RDW: 15.5 % (ref 11.5–15.5)
WBC: 3.9 10*3/uL — ABNORMAL LOW (ref 4.0–10.5)

## 2014-09-14 LAB — LACTIC ACID, PLASMA: Lactic Acid, Venous: 1.8 mmol/L (ref 0.5–2.0)

## 2014-09-14 MED ORDER — MENTHOL 3 MG MT LOZG
1.0000 | LOZENGE | OROMUCOSAL | Status: DC | PRN
  Filled 2014-09-14: qty 9

## 2014-09-14 NOTE — Progress Notes (Signed)
Abdominal pain  Subjective: Pt states pain is better.  Denies any flatus or BM's.  Objective: Vital signs in last 24 hours: Temp:  [98.2 F (36.8 C)-98.8 F (37.1 C)] 98.4 F (36.9 C) (01/31 0514) Pulse Rate:  [66-79] 70 (01/31 0514) Resp:  [16-17] 16 (01/31 0514) BP: (117-155)/(57-71) 129/57 mmHg (01/31 0514) SpO2:  [99 %-100 %] 99 % (01/31 0514) Weight:  [164 lb (74.39 kg)] 164 lb (74.39 kg) (01/30 1100) Last BM Date: 09/12/13  Intake/Output from previous day: 01/30 0701 - 01/31 0700 In: 2280 [I.V.:2250; NG/GT:30] Out: 2900 [Urine:1700; Emesis/NG output:700] Intake/Output this shift:    General appearance: alert and cooperative GI: soft, non-tender NG output: clear  Lab Results:  Results for orders placed or performed during the hospital encounter of 09/13/14 (from the past 24 hour(s))  Glucose, capillary     Status: Abnormal   Collection Time: 09/13/14 11:40 AM  Result Value Ref Range   Glucose-Capillary 191 (H) 70 - 99 mg/dL  Glucose, capillary     Status: Abnormal   Collection Time: 09/13/14  4:02 PM  Result Value Ref Range   Glucose-Capillary 170 (H) 70 - 99 mg/dL   Comment 1 Documented in Chart   Glucose, capillary     Status: Abnormal   Collection Time: 09/13/14  8:11 PM  Result Value Ref Range   Glucose-Capillary 120 (H) 70 - 99 mg/dL   Comment 1 Notify RN    Comment 2 Documented in Chart   Glucose, capillary     Status: Abnormal   Collection Time: 09/14/14 12:05 AM  Result Value Ref Range   Glucose-Capillary 123 (H) 70 - 99 mg/dL   Comment 1 Notify RN    Comment 2 Documented in Chart   Glucose, capillary     Status: Abnormal   Collection Time: 09/14/14  4:09 AM  Result Value Ref Range   Glucose-Capillary 107 (H) 70 - 99 mg/dL   Comment 1 Notify RN    Comment 2 Documented in Chart   CBC     Status: Abnormal   Collection Time: 09/14/14  5:15 AM  Result Value Ref Range   WBC 3.9 (L) 4.0 - 10.5 K/uL   RBC 6.03 (H) 4.22 - 5.81 MIL/uL   Hemoglobin  12.4 (L) 13.0 - 17.0 g/dL   HCT 39.9 39.0 - 52.0 %   MCV 66.2 (L) 78.0 - 100.0 fL   MCH 20.6 (L) 26.0 - 34.0 pg   MCHC 31.1 30.0 - 36.0 g/dL   RDW 15.5 11.5 - 15.5 %   Platelets 142 (L) 150 - 400 K/uL  Basic metabolic panel     Status: Abnormal   Collection Time: 09/14/14  5:15 AM  Result Value Ref Range   Sodium 137 135 - 145 mmol/L   Potassium 4.3 3.5 - 5.1 mmol/L   Chloride 108 96 - 112 mmol/L   CO2 23 19 - 32 mmol/L   Glucose, Bld 124 (H) 70 - 99 mg/dL   BUN 12 6 - 23 mg/dL   Creatinine, Ser 1.32 0.50 - 1.35 mg/dL   Calcium 8.6 8.4 - 10.5 mg/dL   GFR calc non Af Amer 55 (L) >90 mL/min   GFR calc Af Amer 64 (L) >90 mL/min   Anion gap 6 5 - 15  Glucose, capillary     Status: Abnormal   Collection Time: 09/14/14  7:58 AM  Result Value Ref Range   Glucose-Capillary 105 (H) 70 - 99 mg/dL     Studies/Results  Radiology     MEDS, Scheduled . enoxaparin (LOVENOX) injection  40 mg Subcutaneous Q24H  . insulin aspart  0-9 Units Subcutaneous TID WC  . insulin glargine  10 Units Subcutaneous Daily  . pantoprazole (PROTONIX) IV  40 mg Intravenous Q24H     Assessment: Abdominal pain PSBO: no noted bowel function but AXR looks better.  Plan: Will clamp NG today and allow some clears.  If no problems with this, can d/c NG  LOS: 1 day    Rosario Adie, MD [facility], [place]   09/14/2014 10:20 AM

## 2014-09-14 NOTE — Progress Notes (Signed)
[facility] PROGRESS NOTE  [name] [id number] DOB: [birth year] DOA: 09/13/2014 PCP: [name], MD Brief narrative 64 year old male with history of small bowel obstructions requiring surgery laparotomy with lysis of adhesions in 2014, uncontrolled diabetes mellitus (noncompliant with insulin ), hypertension and pulmonary sarcoidosis presented with abdominal pain since the morning of admission patient reports pain in his lower abdominal and, 10/10 in severity which she thought to be due to constipation and gave himself an enema following which the he did have a bowel movement but did not relieve his pain. Denied any nausea or vomiting. As her pain persisted he came to the emergency. CT scan of the abdomen showed distention of small bowel loops suggestive of partial small bowel obstruction. General surgery were consulted who recommended supportive care with IV fluids, NG tube placement for decompression and pain control with serial abdominal exam.  Assessment/Plan: Early/partial small bowel obstruction Continue NG to wall suction. Keep nothing by mouth. Reported care with IV fluids, antiemetics and pain medications. Serial abdominal exam. Follow-up x-ray of the abdomen this morning shows resolved small bowel obstruction. -defer to surgery regarding removal  of NG tube and starting on clear liquids.   Uncontrolled type 2 diabetes mellitus Patient has been noncompliant with his insulin and only taking oral medications. He reports that he is tired of using injections. Patient had elevated blood glucose on presentation. Started on 10 units of Lantus daily and sliding scale insulin.  -Counseled extensively on the importance of taking insulin and controlling his diabetes. Check A1c. Resume oral medications once taking by mouth  Pulmonary sarcoidosis Follows with Dr. Melvyn Novas  Acute kidney injury Likely prerenal. Improved with IV fluids  Essential hypertension Hold in home blood  pressure medications given nothing by mouth. On when necessary IV hydralazine  DVT prophylaxis: Subcutaneous Lovenox  Diet: Nothing by mouth   Code Status: Full code Family communication: None at bedside Disposition: Home once improved  Consultants [place] surgery     Procedures:  CT abdomen  Antibiotics: None  HPI/Subjective:  Patient seen and examined. Reports some pain over her periumbilical area.   Objective: Filed Vitals:   09/14/14 0514  BP: 129/57  Pulse: 70  Temp: 98.4 F (36.9 C)  Resp: 16    Intake/Output Summary (Last 24 hours) at 09/14/14 1014 Last data filed at 09/14/14 0700  Gross per 24 hour  Intake   2280 ml  Output   2900 ml  Net   -620 ml   Filed Weights   09/13/14 0356 09/13/14 1100  Weight: 72.576 kg (160 lb) 74.39 kg (164 lb)    Exam:   General:  Elderly male lying in bed in no acute distress  HEENT: No pallor, NG wall suction draining biliary fluid, neck supple  Cardiovascular: Normal S1 and S2, no murmurs rub or gallop  Chest: Clear to auscultation bilaterally, no added sounds  Gastrointestinal: Soft, nondistended, bowel sounds present, periumbilical tenderness  Musculoskeletal: Warm, no edema  Data Reviewed: Basic Metabolic Panel:  Recent Labs Lab 09/13/14 0413 09/14/14 0515  NA 132* 137  K 4.9 4.3  CL 98 108  CO2 27 23  GLUCOSE 474* 124*  BUN 16 12  CREATININE 1.42* 1.32  CALCIUM 9.9 8.6   Liver Function Tests:  Recent Labs Lab 09/13/14 0413  AST 26  ALT 17  ALKPHOS 184*  BILITOT 1.1  PROT 7.4  ALBUMIN 4.0    Recent Labs Lab 09/13/14 0413  LIPASE 22   No results for input(s): AMMONIA  in the last 168 hours. CBC:  Recent Labs Lab 09/13/14 0413 09/14/14 0515  WBC 7.6 3.9*  NEUTROABS 6.5  --   HGB 13.4 12.4*  HCT 41.7 39.9  MCV 65.1* 66.2*  PLT 149* 142*   Cardiac Enzymes: No results for input(s): CKTOTAL, CKMB, CKMBINDEX, TROPONINI in the last 168 hours. BNP (last 3 results) No  results for input(s): PROBNP in the last 8760 hours. CBG:  Recent Labs Lab 09/13/14 1602 09/13/14 2011 09/14/14 0005 09/14/14 0409 09/14/14 0758  GLUCAP 170* 120* 123* 107* 105*    No results found for this or any previous visit (from the past 240 hour(s)).   Studies: Ct Abdomen Pelvis W Contrast  09/13/2014   CLINICAL DATA:  Diffuse lower abdominal pain, with nausea and constipation. Initial encounter.  EXAM: CT ABDOMEN AND PELVIS WITH CONTRAST  TECHNIQUE: Multidetector CT imaging of the abdomen and pelvis was performed using the standard protocol following bolus administration of intravenous contrast.  CONTRAST:  171mL OMNIPAQUE IOHEXOL 300 MG/ML  SOLN  COMPARISON:  CT of the abdomen and pelvis from 08/30/2012, and CT of the chest performed 06/20/2006  FINDINGS: The visualized lung bases are clear. Enlarged periesophageal nodes and bilateral peribronchial nodes are again seen. These appear stable from CT of the chest dating back to 2007, and may reflect sarcoidosis. Would correlate with patient's clinical diagnoses.  The liver and spleen are unremarkable in appearance. The gallbladder is within normal limits. The pancreas and adrenal glands are unremarkable.  Mild nonspecific perinephric stranding is noted bilaterally. The kidneys are otherwise unremarkable. There is no evidence of hydronephrosis. No renal or ureteral stones are seen.  Note is made of slight distention of small-bowel loops to 3.2 cm in maximal diameter, with gradual fecalization at the lower abdomen. Distal small bowel still contains a small amount of fluid. This likely reflects some degree of small bowel dysmotility. No associated inflammation is seen.  No free fluid is identified. The stomach is within normal limits. No acute vascular abnormalities are seen.  The appendix is normal in caliber, without evidence for appendicitis. The colon is unremarkable in appearance.  The bladder is mildly distended and grossly unremarkable.  The patient is status post prostatectomy, with surrounding clips. There is no evidence of recurrent mass in this region. No inguinal lymphadenopathy is seen. The visualized inguinal nodes are borderline normal in size.  No acute osseous abnormalities are identified. Vacuum phenomenon is noted at L3-L4 and L4-L5, with endplate sclerotic change seen at L4-L5. Mild underlying facet disease is noted at the lower lumbar spine.  IMPRESSION: 1. Slight distention of small bowel loops to 3.2 cm in maximal diameter, with gradual fecalization at the lower abdomen. Distal small bowel still contains a small amount of fluid. This likely reflects some degree of small bowel dysmotility. No associated inflammation seen. No evidence for obstruction. 2. Status post prostatectomy; no evidence of recurrent mass. 3. Enlarged periesophageal and bilateral peribronchial nodes again seen. These appear stable dating back to 2007, and may reflect sarcoidosis. Would correlate with the patient's clinical diagnoses.   Electronically Signed   By: Garald Balding M.D.   On: 09/13/2014 07:35   Dg Abd 2 Views  09/14/2014   CLINICAL DATA:  Followup small bowel obstruction. Left lower quadrant pain beginning 2 nights ago.  EXAM: ABDOMEN - 2 VIEW  COMPARISON:  CT, 09/13/2014.  FINDINGS: There is no radiographic evidence of a bowel obstruction. Residual contrast is noted in a normal caliber colon and in the  rectum. Nasogastric tube tip lies in the mid to distal stomach.  There are surgical vascular clips the pelvis consistent with previous prostate surgery.  No free air.  IMPRESSION: Resolved small bowel obstruction.   Electronically Signed   By: Lajean Manes M.D.   On: 09/14/2014 09:10    Scheduled Meds: . enoxaparin (LOVENOX) injection  40 mg Subcutaneous Q24H  . insulin aspart  0-9 Units Subcutaneous TID WC  . insulin glargine  10 Units Subcutaneous Daily  . pantoprazole (PROTONIX) IV  40 mg Intravenous Q24H   Continuous Infusions: .  sodium chloride 125 mL/hr at 09/14/14 0700       Time spent: 25 minutes    [name], [place]  [facility] Pager [phone]. If 7PM-7AM, please contact night-coverage at [email or web address], password [facility] 09/14/2014, 10:14 AM  LOS: 1 day

## 2014-09-15 LAB — GLUCOSE, CAPILLARY
GLUCOSE-CAPILLARY: 150 mg/dL — AB (ref 70–99)
GLUCOSE-CAPILLARY: 163 mg/dL — AB (ref 70–99)
Glucose-Capillary: 153 mg/dL — ABNORMAL HIGH (ref 70–99)
Glucose-Capillary: 157 mg/dL — ABNORMAL HIGH (ref 70–99)
Glucose-Capillary: 180 mg/dL — ABNORMAL HIGH (ref 70–99)
Glucose-Capillary: 219 mg/dL — ABNORMAL HIGH (ref 70–99)
Glucose-Capillary: 242 mg/dL — ABNORMAL HIGH (ref 70–99)

## 2014-09-15 LAB — CBC
HEMATOCRIT: 39.5 % (ref 39.0–52.0)
Hemoglobin: 12.3 g/dL — ABNORMAL LOW (ref 13.0–17.0)
MCH: 20.8 pg — ABNORMAL LOW (ref 26.0–34.0)
MCHC: 31.1 g/dL (ref 30.0–36.0)
MCV: 66.8 fL — AB (ref 78.0–100.0)
PLATELETS: 135 10*3/uL — AB (ref 150–400)
RBC: 5.91 MIL/uL — AB (ref 4.22–5.81)
RDW: 15.5 % (ref 11.5–15.5)
WBC: 4.1 10*3/uL (ref 4.0–10.5)

## 2014-09-15 LAB — HEMOGLOBIN A1C
HEMOGLOBIN A1C: 16 % — AB (ref 4.8–5.6)
Mean Plasma Glucose: 413 mg/dL

## 2014-09-15 MED ORDER — ASPIRIN 81 MG PO CHEW
81.0000 mg | CHEWABLE_TABLET | Freq: Every day | ORAL | Status: DC
  Administered 2014-09-15 – 2014-09-18 (×4): 81 mg via ORAL
  Filled 2014-09-15 (×5): qty 1

## 2014-09-15 MED ORDER — WHITE PETROLATUM GEL
Status: AC
  Administered 2014-09-15: 0.2
  Filled 2014-09-15: qty 1

## 2014-09-15 MED ORDER — ADULT MULTIVITAMIN W/MINERALS CH
1.0000 | ORAL_TABLET | Freq: Every day | ORAL | Status: DC
  Administered 2014-09-15 – 2014-09-18 (×4): 1 via ORAL
  Filled 2014-09-15 (×5): qty 1

## 2014-09-15 MED ORDER — PANTOPRAZOLE SODIUM 40 MG PO TBEC: 40.0000 mg | DELAYED_RELEASE_TABLET | Freq: Every day | ORAL | Status: DC

## 2014-09-15 MED ORDER — LISINOPRIL 10 MG PO TABS
10.0000 mg | ORAL_TABLET | Freq: Every day | ORAL | Status: DC
  Administered 2014-09-15 – 2014-09-18 (×4): 10 mg via ORAL
  Filled 2014-09-15 (×5): qty 1

## 2014-09-15 MED ORDER — GABAPENTIN 300 MG PO CAPS
300.0000 mg | ORAL_CAPSULE | Freq: Two times a day (BID) | ORAL | Status: DC
  Administered 2014-09-15 – 2014-09-18 (×7): 300 mg via ORAL
  Filled 2014-09-15 (×9): qty 1

## 2014-09-15 NOTE — Progress Notes (Signed)
[facility] PROGRESS NOTE  [name] [id number] DOB: [birth year] DOA: 09/13/2014 PCP: [name], MD Brief narrative 64 year old male with history of small bowel obstructions requiring surgery laparotomy with lysis of adhesions in 2014, uncontrolled diabetes mellitus (noncompliant with insulin ), hypertension and pulmonary sarcoidosis presented with abdominal pain since the morning of admission patient reports pain in his lower abdominal and, 10/10 in severity which she thought to be due to constipation and gave himself an enema following which the he did have a bowel movement but did not relieve his pain. Denied any nausea or vomiting. As her pain persisted he came to the emergency. CT scan of the abdomen showed distention of small bowel loops suggestive of partial small bowel obstruction. General surgery were consulted who recommended supportive care with IV fluids, NG tube placement for decompression and pain control with serial abdominal exam.  Assessment/Plan: Early/partial small bowel obstruction -resolved.  NG clamped. Improved with IV hydration. Pain improved. No further N/V . passing gas now.  -tolerating clears . Will advance to full liquid  appreciate surgery follow up. -prn pain meds and antiemetics.  Uncontrolled type 2 diabetes mellitus Patient has been noncompliant with his insulin and only taking oral medications. He reports that he is tired of using injections. Patient had elevated blood glucose on presentation. Placed  on 12 units of Lantus daily and sliding scale insulin.  -Counseled extensively on the importance of taking insulin and controlling his diabetes. He agrees on continuing lantus at home.  pending  A1c. Resume oral medications once taking by mouth. Diabetic educator consulted.   Pulmonary sarcoidosis Follows with Dr. Melvyn Novas  Acute kidney injury Likely prerenal. Resolved  with IV fluids  Essential hypertension Resume home meds  GERD  continue  PPI    DVT prophylaxis: Subcutaneous Lovenox  Diet: Nothing by mouth   Code Status: Full code Family communication: d/w wife on phone Disposition: Home possibly tomorrow  Consultants [place] [facility]     Procedures:  CT abdomen  Antibiotics: None  HPI/Subjective:  Patient seen and examined. abd pan better. NG clamped yday and placed on clears. tolerating well. No N/V. Passing gas today.   Objective: Filed Vitals:   09/15/14 0549  BP: 125/57  Pulse: 69  Temp: 98.8 F (37.1 C)  Resp: 18    Intake/Output Summary (Last 24 hours) at 09/15/14 0901 Last data filed at 09/15/14 0600  Gross per 24 hour  Intake 2093.75 ml  Output   1400 ml  Net 693.75 ml   Filed Weights   09/13/14 0356 09/13/14 1100  Weight: 72.576 kg (160 lb) 74.39 kg (164 lb)    Exam:   General:  Elderly male  in no acute distress  HEENT: No pallor, NG clamped  neck supple  Cardiovascular: Normal S1 and S2, no murmurs rub or gallop  Chest: Clear to auscultation bilaterally, no added sounds  Gastrointestinal: Soft, nondistended, bowel sounds present, non tender  Musculoskeletal: Warm, no edema  Data Reviewed: Basic Metabolic Panel:  Recent Labs Lab 09/13/14 0413 09/14/14 0515  NA 132* 137  K 4.9 4.3  CL 98 108  CO2 27 23  GLUCOSE 474* 124*  BUN 16 12  CREATININE 1.42* 1.32  CALCIUM 9.9 8.6   Liver Function Tests:  Recent Labs Lab 09/13/14 0413  AST 26  ALT 17  ALKPHOS 184*  BILITOT 1.1  PROT 7.4  ALBUMIN 4.0    Recent Labs Lab 09/13/14 0413  LIPASE 22   No results for input(s): AMMONIA  in the last 168 hours. CBC:  Recent Labs Lab 09/13/14 0413 09/14/14 0515 09/15/14 0529  WBC 7.6 3.9* 4.1  NEUTROABS 6.5  --   --   HGB 13.4 12.4* 12.3*  HCT 41.7 39.9 39.5  MCV 65.1* 66.2* 66.8*  PLT 149* 142* 135*   Cardiac Enzymes: No results for input(s): CKTOTAL, CKMB, CKMBINDEX, TROPONINI in the last 168 hours. BNP (last 3 results) No results for  input(s): PROBNP in the last 8760 hours. CBG:  Recent Labs Lab 09/14/14 1607 09/14/14 2025 09/15/14 0010 09/15/14 0405 09/15/14 0740  GLUCAP 194* 174* 153* 157* 150*    No results found for this or any previous visit (from the past 240 hour(s)).   Studies: Dg Abd 2 Views  09/14/2014   CLINICAL DATA:  Followup small bowel obstruction. Left lower quadrant pain beginning 2 nights ago.  EXAM: ABDOMEN - 2 VIEW  COMPARISON:  CT, 09/13/2014.  FINDINGS: There is no radiographic evidence of a bowel obstruction. Residual contrast is noted in a normal caliber colon and in the rectum. Nasogastric tube tip lies in the mid to distal stomach.  There are surgical vascular clips the pelvis consistent with previous prostate surgery.  No free air.  IMPRESSION: Resolved small bowel obstruction.   Electronically Signed   By: Lajean Manes M.D.   On: 09/14/2014 09:10    Scheduled Meds: . enoxaparin (LOVENOX) injection  40 mg Subcutaneous Q24H  . insulin aspart  0-9 Units Subcutaneous TID WC  . insulin glargine  10 Units Subcutaneous Daily  . pantoprazole (PROTONIX) IV  40 mg Intravenous Q24H   Continuous Infusions: . sodium chloride 125 mL/hr at 09/15/14 0600       Time spent: 25 minutes    [name], [place]  [facility] Pager [phone]. If 7PM-7AM, please contact night-coverage at [email or web address], password [facility] 09/15/2014, 9:01 AM  LOS: 2 days

## 2014-09-15 NOTE — Progress Notes (Signed)
  Subjective: Ambulated, passed a lot of gas but no BM, tolerated clears with NGT clamped  Objective: Vital signs in last 24 hours: Temp:  [97.5 F (36.4 C)-98.8 F (37.1 C)] 98.8 F (37.1 C) (02/01 0549) Pulse Rate:  [66-75] 69 (02/01 0549) Resp:  [16-18] 18 (02/01 0549) BP: (123-126)/(56-57) 125/57 mmHg (02/01 0549) SpO2:  [98 %-100 %] 98 % (02/01 0549) Last BM Date: 09/12/13  Intake/Output from previous day: 09-22-22 0701 - 02/01 0700 In: 2093.8 [I.V.:2093.8] Out: 1400 [Urine:1200; Emesis/NG output:200] Intake/Output this shift:    General appearance: alert and cooperative Resp: clear to auscultation bilaterally Cardio: regular rate and rhythm GI: soft, less distended, +BS, NT  Lab Results:   Recent Labs  09-22-2014 0515 09/15/14 0529  WBC 3.9* 4.1  HGB 12.4* 12.3*  HCT 39.9 39.5  PLT 142* 135*   BMET  Recent Labs  09/13/14 0413 September 22, 2014 0515  NA 132* 137  K 4.9 4.3  CL 98 108  CO2 27 23  GLUCOSE 474* 124*  BUN 16 12  CREATININE 1.42* 1.32  CALCIUM 9.9 8.6   PT/INR No results for input(s): LABPROT, INR in the last 72 hours. ABG No results for input(s): PHART, HCO3 in the last 72 hours.  Invalid input(s): PCO2, PO2  Studies/Results: Dg Abd 2 Views  09-22-2014   CLINICAL DATA:  Followup small bowel obstruction. Left lower quadrant pain beginning 2 nights ago.  EXAM: ABDOMEN - 2 VIEW  COMPARISON:  CT, 09/13/2014.  FINDINGS: There is no radiographic evidence of a bowel obstruction. Residual contrast is noted in a normal caliber colon and in the rectum. Nasogastric tube tip lies in the mid to distal stomach.  There are surgical vascular clips the pelvis consistent with previous prostate surgery.  No free air.  IMPRESSION: Resolved small bowel obstruction.   Electronically Signed   By: Lajean Manes M.D.   On: 2014/09/22 09:10    Anti-infectives: Anti-infectives    None      Assessment/Plan: PSBO - resolving. D/C NGT, fulls at dinner today  LOS: 2  days    Arina Torry E 09/15/2014

## 2014-09-15 NOTE — Progress Notes (Addendum)
Inpatient Diabetes Program Recommendations  AACE/ADA: New Consensus Statement on Inpatient Glycemic Control (2013)  Target Ranges:  Prepandial:   less than 140 mg/dL      Peak postprandial:   less than 180 mg/dL (1-2 hours)      Critically ill patients:  140 - 180 mg/dL    Results for [name], [name] (MRN [id number]) as of 09/15/2014 16:22  Ref. Range 09/15/2014 00:10 09/15/2014 04:05 09/15/2014 07:40 09/15/2014 12:07  Glucose-Capillary Latest Range: 70-99 mg/dL 153 (H) 157 (H) 150 (H) 242 (H)    Results for [name], [name] (MRN [id number]) as of 09/15/2014 16:22  Ref. Range 09/13/2014 04:13  Hgb A1c MFr Bld Latest Range: 4.8-5.6 % 16.0 (H)     Admitted with SBO.  History of DM, HTN, Pulmonary Sarcoidosis.  Home DM Meds: Glipizide 5 mg daily       Metformin 1000 mg daily       Lantus 5 units QHS (per notes, patient has not been taking his DM medications)  Current Orders: Lantus 10 units daily     Novolog Sensitive SSI   **Spoke to patient about his current A1c of 16%.  Explained what an A1c is and what it measures.  Reminded patient that his goal A1c is 7% or less per ADA standards to prevent both acute and long-term complications.  Explained to patient the extreme importance of good glucose control at home.  Encouraged patient to check his CBGs at least bid at home (fasting and another check within the day) and to record all CBGs in a logbook for his PCP to review.  **Patient was very candid with me and told me that he became very depressed and frustrated and threw his glucometer, Glipizide, and Metformin in the trash.  Did not throw his Lantus away but has not been taking his Lantus now for several weeks.  Patient told me he felt like "giving up" and just didn't want to take medicine anymore.  Patient went on to tell me that he spoke with a friend and had a change of heart and decided he needed to take better care of himself.  Encouraged patient to not give up his fight with diabetes and also  encouraged patient to take better care of himself so he could be healthy for his family.  Patient agreeable to resuming his medications at time of d/c and asked me to please ask the MD for Rxs for his home oral DM medications along with a new glucometer.  **Also discussed DM diet information with patient.  Encouraged patient to avoid beverages with sugar (regular soda, sweet tea, lemonade, fruit juice) and to consume mostly water.  Discussed what foods contain carbohydrates and how carbohydrates affect the body's blood sugar levels.  Encouraged patient to be careful with his portion sizes (especially grains, starchy vegetables, and fruits).  Explained to patient that men should have 60-75 grams of carbohydrates per meal per day.  Also reviewed how to read food labels at home as well to figure out how much carbohydrate is in packaged foods.  **PCP [name] with [employer].  Plans to see Dr. Meredith Pel for Endocrinology after d/c.  **Patient very appreciative of my visit and requested follow-up visit with CDE after d/c.  Will place Outpatient DM education referral today to the [place] [facility] DM management center.   MD- Please give patient Rxs for the following at time of d/c:  1. CBG Meter and supplies [Order # [id number] 2. Glipizide 3.  Metformin  -Patient states he has Lantus insulin pens and insulin pen needles at home.  Just needs new CBG meter and oral DM medications   Will follow Wyn Quaker RN, MSN, CDE Diabetes Coordinator Inpatient Diabetes Program Team Pager: [phone] (8a-10p)

## 2014-09-16 ENCOUNTER — Inpatient Hospital Stay (HOSPITAL_COMMUNITY): Payer: Medicare Other

## 2014-09-16 DIAGNOSIS — J9801 Acute bronchospasm: Secondary | ICD-10-CM

## 2014-09-16 DIAGNOSIS — R739 Hyperglycemia, unspecified: Secondary | ICD-10-CM | POA: Insufficient documentation

## 2014-09-16 DIAGNOSIS — K56600 Partial intestinal obstruction, unspecified as to cause: Secondary | ICD-10-CM | POA: Diagnosis present

## 2014-09-16 LAB — GLUCOSE, CAPILLARY
GLUCOSE-CAPILLARY: 193 mg/dL — AB (ref 70–99)
GLUCOSE-CAPILLARY: 260 mg/dL — AB (ref 70–99)
GLUCOSE-CAPILLARY: 372 mg/dL — AB (ref 70–99)
Glucose-Capillary: 379 mg/dL — ABNORMAL HIGH (ref 70–99)
Glucose-Capillary: 305 mg/dL — ABNORMAL HIGH (ref 70–99)

## 2014-09-16 MED ORDER — GI COCKTAIL ~~LOC~~
30.0000 mL | Freq: Once | ORAL | Status: AC
  Administered 2014-09-16: 30 mL via ORAL
  Filled 2014-09-16: qty 30

## 2014-09-16 MED ORDER — PANTOPRAZOLE SODIUM 40 MG PO TBEC
40.0000 mg | DELAYED_RELEASE_TABLET | Freq: Every day | ORAL | Status: DC
  Administered 2014-09-16: 40 mg via ORAL
  Filled 2014-09-16 (×2): qty 1

## 2014-09-16 MED ORDER — GLIPIZIDE 5 MG PO TABS
5.0000 mg | ORAL_TABLET | Freq: Every day | ORAL | Status: DC
  Administered 2014-09-17: 5 mg via ORAL
  Filled 2014-09-16 (×2): qty 1

## 2014-09-16 MED ORDER — METHYLPREDNISOLONE SODIUM SUCC 125 MG IJ SOLR
125.0000 mg | INTRAMUSCULAR | Status: AC
  Administered 2014-09-16: 125 mg via INTRAVENOUS
  Filled 2014-09-16: qty 2

## 2014-09-16 MED ORDER — INSULIN GLARGINE 100 UNIT/ML ~~LOC~~ SOLN
12.0000 [IU] | Freq: Every day | SUBCUTANEOUS | Status: DC
  Filled 2014-09-16: qty 0.12

## 2014-09-16 MED ORDER — METHYLPREDNISOLONE SODIUM SUCC 125 MG IJ SOLR
60.0000 mg | Freq: Once | INTRAMUSCULAR | Status: AC
  Administered 2014-09-16: 60 mg via INTRAVENOUS
  Filled 2014-09-16: qty 2

## 2014-09-16 MED ORDER — IPRATROPIUM-ALBUTEROL 0.5-2.5 (3) MG/3ML IN SOLN
3.0000 mL | RESPIRATORY_TRACT | Status: DC
  Administered 2014-09-16 – 2014-09-17 (×7): 3 mL via RESPIRATORY_TRACT
  Filled 2014-09-16 (×6): qty 3

## 2014-09-16 MED ORDER — PREDNISONE 20 MG PO TABS
40.0000 mg | ORAL_TABLET | Freq: Every day | ORAL | Status: DC
  Administered 2014-09-17: 40 mg via ORAL
  Filled 2014-09-16 (×2): qty 2

## 2014-09-16 MED ORDER — IPRATROPIUM-ALBUTEROL 0.5-2.5 (3) MG/3ML IN SOLN
RESPIRATORY_TRACT | Status: AC
  Filled 2014-09-16: qty 3

## 2014-09-16 MED ORDER — INSULIN ASPART 100 UNIT/ML ~~LOC~~ SOLN
0.0000 [IU] | Freq: Every day | SUBCUTANEOUS | Status: DC
  Administered 2014-09-16: 4 [IU] via SUBCUTANEOUS
  Administered 2014-09-17: 3 [IU] via SUBCUTANEOUS

## 2014-09-16 MED ORDER — INSULIN ASPART 100 UNIT/ML ~~LOC~~ SOLN
0.0000 [IU] | Freq: Three times a day (TID) | SUBCUTANEOUS | Status: DC
  Administered 2014-09-16: 15 [IU] via SUBCUTANEOUS
  Administered 2014-09-17 (×2): 5 [IU] via SUBCUTANEOUS
  Administered 2014-09-18: 3 [IU] via SUBCUTANEOUS

## 2014-09-16 NOTE — Progress Notes (Signed)
Patient ID: [name], male   DOB: [birth year], 64 y.o.   MRN: [id number]    Subjective: Pt c/o "can't breath"  This started around 0100am last night.  He was given a breathing treatment which helped some for about an hour, but then returned to his baseline of feeling as if he can't breath.  He has 2L [place] in place and his sats are high 90s to 100s.    He is tolerating his full liquids ok.  Passing flatus and stool  Objective: Vital signs in last 24 hours: Temp:  [98.1 F (36.7 C)] 98.1 F (36.7 C) (02/02 0601) Pulse Rate:  [67-99] 99 (02/02 0601) Resp:  [16] 16 (02/02 0601) BP: (144-149)/(63-68) 144/63 mmHg (02/02 0601) SpO2:  [98 %-100 %] 100 % (02/02 0601) Last BM Date: 09/15/14  Intake/Output from previous day: 02/01 0701 - 02/02 0700 In: 1449.2 [P.O.:118; I.V.:1331.2] Out: -  Intake/Output this shift:    PE: Abd: soft, minimal distention, minimally tender, +BS Heart: regular Lungs: diffuse wheezing, almost sounds like he has a bit of stridor.    Lab Results:   Recent Labs  09/14/14 0515 09/15/14 0529  WBC 3.9* 4.1  HGB 12.4* 12.3*  HCT 39.9 39.5  PLT 142* 135*   BMET  Recent Labs  09/14/14 0515  NA 137  K 4.3  CL 108  CO2 23  GLUCOSE 124*  BUN 12  CREATININE 1.32  CALCIUM 8.6   PT/INR No results for input(s): LABPROT, INR in the last 72 hours. CMP     Component Value Date/Time   NA 137 09/14/2014 0515   K 4.3 09/14/2014 0515   CL 108 09/14/2014 0515   CO2 23 09/14/2014 0515   GLUCOSE 124* 09/14/2014 0515   BUN 12 09/14/2014 0515   CREATININE 1.32 09/14/2014 0515   CALCIUM 8.6 09/14/2014 0515   PROT 7.4 09/13/2014 0413   ALBUMIN 4.0 09/13/2014 0413   AST 26 09/13/2014 0413   ALT 17 09/13/2014 0413   ALKPHOS 184* 09/13/2014 0413   BILITOT 1.1 09/13/2014 0413   GFRNONAA 55* 09/14/2014 0515   GFRAA 64* 09/14/2014 0515   Lipase     Component Value Date/Time   LIPASE 22 09/13/2014 0413       Studies/Results: No results  found.  Anti-infectives: Anti-infectives    None       Assessment/Plan  1. SBO, improving 2. Pulmonary sarcoidosis with some respiratory distress  Plan: 1. I have spoken to Dr. Clementeen Graham who is going to order a CXR and come evaluate the patient.  Despite good O2 sats, then patient clearly has some abnormal breath sounds and clearly seems to be limited in his ability to take in a breath.  Will defer further work up to primary service. 2. His abdomen is doing well.  Will advance to a soft diet today.    LOS: 3 days    Latosha Gaylord E 09/16/2014, 7:59 AM Pager: [phone]

## 2014-09-16 NOTE — Progress Notes (Signed)
Pt awakened from sleep c/o inability/exp wheezing noted/ very anxious/ breathing tx given/ resp therapy notified/ another breathing tx given/ rapid response notified/ nurse practioner on call notified/ additonal orders carried out/ pt is much improved/fp

## 2014-09-16 NOTE — Progress Notes (Signed)
Inpatient Diabetes Program Recommendations  AACE/ADA: New Consensus Statement on Inpatient Glycemic Control (2013)  Target Ranges:  Prepandial:   less than 140 mg/dL      Peak postprandial:   less than 180 mg/dL (1-2 hours)      Critically ill patients:  140 - 180 mg/dL   Increase to moderate correction scale and add Novolog HS scale per Glycemic Control order-set. Thank you  Raoul Pitch BSN, RN,CDE Inpatient Diabetes Coordinator [phone] (team pager)

## 2014-09-16 NOTE — Progress Notes (Signed)
[facility] PROGRESS NOTE  [name] [id number] DOB: Mar 06, 1951 DOA: 09/13/2014 PCP: [name], MD Brief narrative 64 year old male with history of small bowel obstructions requiring surgery laparotomy with lysis of adhesions in 2014, uncontrolled diabetes mellitus (noncompliant with insulin ), hypertension and pulmonary sarcoidosis presented with abdominal pain since the morning of admission patient reports pain in his lower abdominal and, 10/10 in severity which she thought to be due to constipation and gave himself an enema following which the he did have a bowel movement but did not relieve his pain. Denied any nausea or vomiting. As her pain persisted he came to the emergency. CT scan of the abdomen showed distention of small bowel loops suggestive of partial small bowel obstruction. General surgery were consulted who recommended supportive care with IV fluids, NG tube placement for decompression and pain control with serial abdominal exam.  Assessment/Plan: Early/partial small bowel obstruction -resolved.  NG removed. Now tolerating advanced diet. No further abd pain.   appreciate surgery follow up. -prn pain meds and antiemetics.  Uncontrolled type 2 diabetes mellitus Patient has been noncompliant with his insulin and only taking oral medications. He reports that he is tired of using injections. Patient had elevated blood glucose on presentation. Placed  on 12 units of Lantus daily and sliding scale insulin.  -Counseled extensively on the importance of taking insulin and controlling his diabetes. He agrees on continuing lantus at home.  A1C>16. [name] Resume Glipizide. resume metformin upon discharge. . Diabetic educator consulted.    Acute shortness of breath with wheezing on 2/1 overnight. RRT called as pt reported difficulty breathing . Found to be tachypnic with diffuse wheezing. Given 60 mg IV solumedrol and 1 dose albuterol neb. During rounds he still c/o difficulty  breathing. No stridor, hoarseness fo voice or drooling of saliva on exam. Diffusely wheezy b/l. CXR stat done without acute findings. Given 125 mg IV solumedrol and duoneb with resolution of symptoms.  will place on po prednisone 40 mg daily for 5 days and prn nebs.  Pulmonary sarcoidosis Follows with Dr. Melvyn Novas  Acute kidney injury Likely prerenal. Resolved  with IV fluids  Essential hypertension Resume home meds  GERD  continue  PPI    DVT prophylaxis: Subcutaneous Lovenox  Diet: soft   Code Status: Full code Family communication: d/w wife on phone on 2/1 Disposition: d/c planned today held due to acute shortness of breath and wheezing. Home possibly tomorrow if breathing stable.  Consultants [place] [facility]     Procedures:  CT abdomen  Antibiotics: None  HPI/Subjective:  Patient seen and examined. RRT called overnight for acute SOB and wheezing. Given IV solumedrol and neb without much improvement.   Objective: Filed Vitals:   09/16/14 0601  BP: 144/63  Pulse: 99  Temp: 98.1 F (36.7 C)  Resp: 16    Intake/Output Summary (Last 24 hours) at 09/16/14 1429 Last data filed at 09/16/14 0700  Gross per 24 hour  Intake 1449.24 ml  Output      0 ml  Net 1449.24 ml   Filed Weights   09/13/14 0356 09/13/14 1100  Weight: 72.576 kg (160 lb) 74.39 kg (164 lb)    Exam:   General:  Elderly male  in some distress with shortness of  breath  HEENT: No pallor, , no stridor, supple neck  Cardiovascular: Normal S1 and S2, no murmurs rub or gallop  Chest: diffuse wheeze b/l, no crackles or rhonchi  Gastrointestinal: Soft, nondistended, bowel sounds present, non tender  Musculoskeletal: Warm, no edema  CNS: alert and oriented.  Data Reviewed: Basic Metabolic Panel:  Recent Labs Lab 09/13/14 0413 09/14/14 0515  NA 132* 137  K 4.9 4.3  CL 98 108  CO2 27 23  GLUCOSE 474* 124*  BUN 16 12  CREATININE 1.42* 1.32  CALCIUM 9.9 8.6   Liver Function  Tests:  Recent Labs Lab 09/13/14 0413  AST 26  ALT 17  ALKPHOS 184*  BILITOT 1.1  PROT 7.4  ALBUMIN 4.0    Recent Labs Lab 09/13/14 0413  LIPASE 22   No results for input(s): AMMONIA in the last 168 hours. CBC:  Recent Labs Lab 09/13/14 0413 09/14/14 0515 09/15/14 0529  WBC 7.6 3.9* 4.1  NEUTROABS 6.5  --   --   HGB 13.4 12.4* 12.3*  HCT 41.7 39.9 39.5  MCV 65.1* 66.2* 66.8*  PLT 149* 142* 135*   Cardiac Enzymes: No results for input(s): CKTOTAL, CKMB, CKMBINDEX, TROPONINI in the last 168 hours. BNP (last 3 results) No results for input(s): PROBNP in the last 8760 hours. CBG:  Recent Labs Lab 09/15/14 1957 09/15/14 2358 09/16/14 0340 09/16/14 0721 09/16/14 1209  GLUCAP 163* 180* 193* 260* 379*    No results found for this or any previous visit (from the past 240 hour(s)).   Studies: Dg Chest Port 1 View  09/16/2014   CLINICAL DATA:  Shortness of breath.  EXAM: PORTABLE CHEST - 1 VIEW  COMPARISON:  01/15/2013 and 08/25/2012  FINDINGS: Lungs are adequately inflated with subtle bibasilar opacification suggesting atelectasis although cannot exclude developing infection. There is stable prominence of the AP window compatible with adenopathy in this known sarcoid patient. Cardiomediastinal silhouette and remainder of the exam is unchanged.  IMPRESSION: Minimal bibasilar opacification likely atelectasis although cannot exclude developing infection.  Stable AP window adenopathy compatible with known sarcoidosis.   Electronically Signed   By: [name] Olp M.D.   On: 09/16/2014 08:55    Scheduled Meds: . aspirin  81 mg Oral Daily  . enoxaparin (LOVENOX) injection  40 mg Subcutaneous Q24H  . gabapentin  300 mg Oral BID  . insulin aspart  0-9 Units Subcutaneous TID WC  . insulin glargine  10 Units Subcutaneous Daily  . ipratropium-albuterol  3 mL Nebulization Q4H  . lisinopril  10 mg Oral Daily  . multivitamin with minerals  1 tablet Oral Daily  . pantoprazole  40  mg Oral Daily   Continuous Infusions:       Time spent: 25 minutes    Nakira Litzau, [place]  [facility] Pager [phone]. If 7PM-7AM, please contact night-coverage at [email or web address], password [facility] 09/16/2014, 2:29 PM  LOS: 3 days

## 2014-09-16 NOTE — Care Management (Signed)
09-16-14 IM from Medicare given. Khaleef Ruby RN BSN  

## 2014-09-16 NOTE — Significant Event (Addendum)
Rapid Response Event Note Called by primary RN to see pt with c/o difficulty breathing after waking up suddenly. Neb tx already given Overview: Time Called: 0236 Arrival Time: 0238 Event Type: Respiratory  Initial Focused Assessment: On my arrival [name] was in the bathroom.  He ambulated back to his bed with min. Difficulty.  BBS CTA with good lung expansion, upper airway wheeze on occasion, almost sounds like mild stridor.  Pt has a barking cough.  Pt is taking prolonged purposeful appearing exhalations.  He states he feels very anxious & has some acid reflux.  He had a friend that died from "acid reflux".  O2 sats 100% on 2L.  Pt recvd solumedrol & IV Morphine with good success.  He still has some c/o reflux.  GI cocktail given.  Pt states he is feeling much better.  Interventions:  Solumedrol Morphine Education re: good sleeping position for reflux GI cocktail  Event Summary: Name of Physician Notified: Lurlean Leyden, NP at 0255    at    Outcome: Stayed in room and stabalized     [name]

## 2014-09-17 DIAGNOSIS — R0602 Shortness of breath: Secondary | ICD-10-CM | POA: Diagnosis present

## 2014-09-17 DIAGNOSIS — R609 Edema, unspecified: Secondary | ICD-10-CM | POA: Diagnosis present

## 2014-09-17 DIAGNOSIS — R0609 Other forms of dyspnea: Secondary | ICD-10-CM

## 2014-09-17 DIAGNOSIS — R0600 Dyspnea, unspecified: Secondary | ICD-10-CM

## 2014-09-17 DIAGNOSIS — R600 Localized edema: Secondary | ICD-10-CM | POA: Diagnosis present

## 2014-09-17 LAB — BASIC METABOLIC PANEL
Anion gap: 8 (ref 5–15)
BUN: 7 mg/dL (ref 6–23)
CO2: 25 mmol/L (ref 19–32)
CREATININE: 1.15 mg/dL (ref 0.50–1.35)
Calcium: 9 mg/dL (ref 8.4–10.5)
Chloride: 110 mmol/L (ref 96–112)
GFR calc Af Amer: 75 mL/min — ABNORMAL LOW (ref >90)
GFR, EST NON AFRICAN AMERICAN: 65 mL/min — AB (ref >90)
Glucose, Bld: 168 mg/dL — ABNORMAL HIGH (ref 70–99)
Potassium: 3.2 mmol/L — ABNORMAL LOW (ref 3.5–5.1)
Sodium: 143 mmol/L (ref 135–145)

## 2014-09-17 LAB — GLUCOSE, CAPILLARY
GLUCOSE-CAPILLARY: 239 mg/dL — AB (ref 70–99)
GLUCOSE-CAPILLARY: 279 mg/dL — AB (ref 70–99)
Glucose-Capillary: 210 mg/dL — ABNORMAL HIGH (ref 70–99)
Glucose-Capillary: 109 mg/dL — ABNORMAL HIGH (ref 70–99)

## 2014-09-17 LAB — BRAIN NATRIURETIC PEPTIDE: B Natriuretic Peptide: 287 pg/mL — ABNORMAL HIGH (ref 0.0–100.0)

## 2014-09-17 MED ORDER — INSULIN GLARGINE 100 UNIT/ML ~~LOC~~ SOLN
15.0000 [IU] | Freq: Every day | SUBCUTANEOUS | Status: DC
  Administered 2014-09-17: 15 [IU] via SUBCUTANEOUS
  Filled 2014-09-17 (×2): qty 0.15

## 2014-09-17 MED ORDER — PANTOPRAZOLE SODIUM 40 MG PO TBEC
40.0000 mg | DELAYED_RELEASE_TABLET | Freq: Two times a day (BID) | ORAL | Status: DC
  Administered 2014-09-17 – 2014-09-18 (×3): 40 mg via ORAL
  Filled 2014-09-17 (×3): qty 1

## 2014-09-17 MED ORDER — IPRATROPIUM-ALBUTEROL 0.5-2.5 (3) MG/3ML IN SOLN
3.0000 mL | Freq: Four times a day (QID) | RESPIRATORY_TRACT | Status: DC
  Administered 2014-09-17 – 2014-09-18 (×4): 3 mL via RESPIRATORY_TRACT
  Filled 2014-09-17 (×4): qty 3

## 2014-09-17 MED ORDER — FUROSEMIDE 10 MG/ML IJ SOLN
40.0000 mg | Freq: Once | INTRAMUSCULAR | Status: AC
  Administered 2014-09-17: 40 mg via INTRAVENOUS
  Filled 2014-09-17: qty 4

## 2014-09-17 MED ORDER — PREDNISONE 20 MG PO TABS
30.0000 mg | ORAL_TABLET | Freq: Every day | ORAL | Status: DC
  Administered 2014-09-18: 30 mg via ORAL
  Filled 2014-09-17 (×3): qty 1

## 2014-09-17 MED ORDER — LORAZEPAM 1 MG PO TABS: 1.0000 mg | ORAL_TABLET | Freq: Four times a day (QID) | ORAL | Status: DC | PRN

## 2014-09-17 MED ORDER — GLIPIZIDE 10 MG PO TABS
10.0000 mg | ORAL_TABLET | Freq: Two times a day (BID) | ORAL | Status: DC
  Administered 2014-09-17: 10 mg via ORAL
  Filled 2014-09-17 (×4): qty 1

## 2014-09-17 NOTE — Progress Notes (Signed)
Patient ID: [name], male   DOB: Jan 02, 1951, 64 y.o.   MRN: [id number]    Subjective: Feels better, but still with some chest tightness.  Tolerating a soft diet and still passing flatus  Objective: Vital signs in last 24 hours: Temp:  [97.9 F (36.6 C)-98.3 F (36.8 C)] 98.3 F (36.8 C) (02/03 0512) Pulse Rate:  [86-88] 86 (02/03 0512) Resp:  [15-20] 15 (02/03 0512) BP: (125-129)/(49-57) 125/49 mmHg (02/03 0512) SpO2:  [98 %-100 %] 98 % (02/03 0512) Last BM Date: 09/16/14  Intake/Output from previous day:   Intake/Output this shift:    PE: Abd: soft, Nt, ND, +BS  Lab Results:   Recent Labs  09/15/14 0529  WBC 4.1  HGB 12.3*  HCT 39.5  PLT 135*   BMET No results for input(s): NA, K, CL, CO2, GLUCOSE, BUN, CREATININE, CALCIUM in the last 72 hours. PT/INR No results for input(s): LABPROT, INR in the last 72 hours. CMP     Component Value Date/Time   NA 137 09/14/2014 0515   K 4.3 09/14/2014 0515   CL 108 09/14/2014 0515   CO2 23 09/14/2014 0515   GLUCOSE 124* 09/14/2014 0515   BUN 12 09/14/2014 0515   CREATININE 1.32 09/14/2014 0515   CALCIUM 8.6 09/14/2014 0515   PROT 7.4 09/13/2014 0413   ALBUMIN 4.0 09/13/2014 0413   AST 26 09/13/2014 0413   ALT 17 09/13/2014 0413   ALKPHOS 184* 09/13/2014 0413   BILITOT 1.1 09/13/2014 0413   GFRNONAA 55* 09/14/2014 0515   GFRAA 64* 09/14/2014 0515   Lipase     Component Value Date/Time   LIPASE 22 09/13/2014 0413       Studies/Results: Dg Chest Port 1 View  09/16/2014   CLINICAL DATA:  Shortness of breath.  EXAM: PORTABLE CHEST - 1 VIEW  COMPARISON:  01/15/2013 and 08/25/2012  FINDINGS: Lungs are adequately inflated with subtle bibasilar opacification suggesting atelectasis although cannot exclude developing infection. There is stable prominence of the AP window compatible with adenopathy in this known sarcoid patient. Cardiomediastinal silhouette and remainder of the exam is unchanged.  IMPRESSION: Minimal  bibasilar opacification likely atelectasis although cannot exclude developing infection.  Stable AP window adenopathy compatible with known sarcoidosis.   Electronically Signed   By: [name] Olp M.D.   On: 09/16/2014 08:55    Anti-infectives: Anti-infectives    None       Assessment/Plan  1. Sbo, resolved  Plan: 1. Patient is surgically stable for dc home from a belly standpoint.  No surgical indications.  We will sign off.   LOS: 4 days    Arul Farabee E 09/17/2014, 9:27 AM Pager: [phone]

## 2014-09-17 NOTE — Progress Notes (Signed)
[facility] PROGRESS NOTE  ENVER MELLER [id number] DOB: [birth year] DOA: 09/13/2014 PCP: [name], MD Brief narrative 64 year old male with history of small bowel obstructions requiring surgery laparotomy with lysis of adhesions in 2014, uncontrolled diabetes mellitus (noncompliant with insulin ), hypertension and pulmonary sarcoidosis presented with abdominal pain since the morning of admission patient reports pain in his lower abdominal and, 10/10 in severity which she thought to be due to constipation and gave himself an enema following which the he did have a bowel movement but did not relieve his pain. Denied any nausea or vomiting. As her pain persisted he came to the emergency. CT scan of the abdomen showed distention of small bowel loops suggestive of partial small bowel obstruction. General surgery were consulted who recommended supportive care with IV fluids, NG tube placement for decompression and pain control with serial abdominal exam.  Assessment/Plan: Early/partial small bowel obstruction Tolerating solids  Dyspnea: lungs clear. Patient reports orthopnea and leg edema.  CXR without CHF, but will give a dose of lasix, check weight, BNP. Taper steroids. Change HHN to qid.  Seems to have an anxiety component as well. Prn ativan  Uncontrolled type 2 diabetes mellitus Worse on steroids. Will increase lantus to 15 units. Increase glipizide to 10 mg bid. Metformin held. Noncompliant with lantus at home  Pulmonary sarcoidosis Follows with Dr. Melvyn Novas  Acute kidney injury Likely prerenal. Resolved  with IV fluids. Recheck BMET  Essential hypertension Resume home meds  GERD Increase ppi to bid in case contributing to dyspnea  DVT prophylaxis: Subcutaneous Lovenox   Code Status: Full code Family communication: wife at bedside Disposition: Home tomorrow if stable  Consultants [facility]  Procedures:  NGT  Antibiotics: None  HPI/Subjective:  C/o dyspnea this  am and orthopnea, leg edema, and "panic".  tol solids. No abd pain   Objective: Filed Vitals:   09/17/14 0512  BP: 125/49  Pulse: 86  Temp: 98.3 F (36.8 C)  Resp: 15   No intake or output data in the 24 hours ending 09/17/14 0927 Filed Weights   09/13/14 0356 09/13/14 1100  Weight: 72.576 kg (160 lb) 74.39 kg (164 lb)    Exam:   General:  Talkative. No respiratory distress noted. Occasionally tearful  Cardiovascular: Normal S1 and S2, no murmurs rub or gallop  Chest: Clear to auscultation bilaterally, no added sounds  Gastrointestinal: Soft, nondistended, bowel sounds present, non tender  Musculoskeletal: Warm, 1+ tense edema  Data Reviewed: Basic Metabolic Panel:  Recent Labs Lab 09/13/14 0413 09/14/14 0515  NA 132* 137  K 4.9 4.3  CL 98 108  CO2 27 23  GLUCOSE 474* 124*  BUN 16 12  CREATININE 1.42* 1.32  CALCIUM 9.9 8.6   Liver Function Tests:  Recent Labs Lab 09/13/14 0413  AST 26  ALT 17  ALKPHOS 184*  BILITOT 1.1  PROT 7.4  ALBUMIN 4.0    Recent Labs Lab 09/13/14 0413  LIPASE 22   No results for input(s): AMMONIA in the last 168 hours. CBC:  Recent Labs Lab 09/13/14 0413 09/14/14 0515 09/15/14 0529  WBC 7.6 3.9* 4.1  NEUTROABS 6.5  --   --   HGB 13.4 12.4* 12.3*  HCT 41.7 39.9 39.5  MCV 65.1* 66.2* 66.8*  PLT 149* 142* 135*   Cardiac Enzymes: No results for input(s): CKTOTAL, CKMB, CKMBINDEX, TROPONINI in the last 168 hours. BNP (last 3 results) No results for input(s): PROBNP in the last 8760 hours. CBG:  Recent Labs Lab 09/16/14  [id number] 09/16/14 1209 09/16/14 1613 09/16/14 2136 09/17/14 0833  GLUCAP 260* 379* 372* 305* 210*    No results found for this or any previous visit (from the past 240 hour(s)).   Studies: Dg Chest Port 1 View  09/16/2014   CLINICAL DATA:  Shortness of breath.  EXAM: PORTABLE CHEST - 1 VIEW  COMPARISON:  01/15/2013 and 08/25/2012  FINDINGS: Lungs are adequately inflated with subtle  bibasilar opacification suggesting atelectasis although cannot exclude developing infection. There is stable prominence of the AP window compatible with adenopathy in this known sarcoid patient. Cardiomediastinal silhouette and remainder of the exam is unchanged.  IMPRESSION: Minimal bibasilar opacification likely atelectasis although cannot exclude developing infection.  Stable AP window adenopathy compatible with known sarcoidosis.   Electronically Signed   By: [name] Olp M.D.   On: 09/16/2014 08:55    Scheduled Meds: . aspirin  81 mg Oral Daily  . enoxaparin (LOVENOX) injection  40 mg Subcutaneous Q24H  . furosemide  40 mg Intravenous Once  . gabapentin  300 mg Oral BID  . glipiZIDE  10 mg Oral BID AC  . insulin aspart  0-15 Units Subcutaneous TID WC  . insulin aspart  0-5 Units Subcutaneous QHS  . insulin glargine  15 Units Subcutaneous Daily  . ipratropium-albuterol  3 mL Nebulization QID  . lisinopril  10 mg Oral Daily  . multivitamin with minerals  1 tablet Oral Daily  . pantoprazole  40 mg Oral BID  . [START ON 09/18/2014] predniSONE  30 mg Oral Q breakfast   Continuous Infusions:    Time spent: 25 minutes  [facility] [email or web address], password [email or web address] 09/17/2014, 9:27 AM  LOS: 4 days

## 2014-09-18 LAB — GLUCOSE, CAPILLARY
Glucose-Capillary: 90 mg/dL (ref 70–99)
Glucose-Capillary: 165 mg/dL — ABNORMAL HIGH (ref 70–99)

## 2014-09-18 LAB — CBC
HCT: 35.8 % — ABNORMAL LOW (ref 39.0–52.0)
Hemoglobin: 11.6 g/dL — ABNORMAL LOW (ref 13.0–17.0)
MCH: 21.1 pg — AB (ref 26.0–34.0)
MCHC: 32.4 g/dL (ref 30.0–36.0)
MCV: 65.1 fL — AB (ref 78.0–100.0)
PLATELETS: 147 10*3/uL — AB (ref 150–400)
RBC: 5.5 MIL/uL (ref 4.22–5.81)
RDW: 15.5 % (ref 11.5–15.5)
WBC: 7.1 10*3/uL (ref 4.0–10.5)

## 2014-09-18 MED ORDER — METFORMIN HCL 1000 MG PO TABS: 1000.0000 mg | ORAL_TABLET | Freq: Two times a day (BID) | ORAL | Status: DC

## 2014-09-18 MED ORDER — FREESTYLE SYSTEM KIT: 1.0000 | PACK | Freq: Every day | Status: DC

## 2014-09-18 MED ORDER — CLONIDINE HCL 0.2 MG/24HR TD PTWK: 0.2000 mg | MEDICATED_PATCH | TRANSDERMAL | Status: DC

## 2014-09-18 MED ORDER — IPRATROPIUM-ALBUTEROL 0.5-2.5 (3) MG/3ML IN SOLN
3.0000 mL | Freq: Three times a day (TID) | RESPIRATORY_TRACT | Status: DC
  Administered 2014-09-18: 3 mL via RESPIRATORY_TRACT
  Filled 2014-09-18: qty 3

## 2014-09-18 MED ORDER — GABAPENTIN 300 MG PO CAPS: 300.0000 mg | ORAL_CAPSULE | Freq: Two times a day (BID) | ORAL | Status: DC

## 2014-09-18 MED ORDER — FREESTYLE LANCETS MISC: Status: AC

## 2014-09-18 MED ORDER — GLUCOSE BLOOD VI STRP: ORAL_STRIP | Status: DC

## 2014-09-18 MED ORDER — GLIPIZIDE 10 MG PO TABS: 10.0000 mg | ORAL_TABLET | Freq: Two times a day (BID) | ORAL | Status: DC

## 2014-09-18 MED ORDER — OLMESARTAN MEDOXOMIL 20 MG PO TABS: ORAL_TABLET | ORAL | Status: DC

## 2014-09-18 NOTE — Progress Notes (Signed)
To whom it may concern:   [name] is unable to work from 09/13/14 through 09/23/14 due to illness.   Sincerely,     Doree Barthel, MD [facility]

## 2014-09-18 NOTE — Discharge Summary (Signed)
Physician Discharge Summary  [name] [id number] DOB: 1951-01-27 DOA: 09/13/2014  PCP: [name], MD  Admit date: 09/13/2014 Discharge date: 09/18/2014  Time spent: *greater than 30 minutes  Recommendations for Outpatient Follow-up:  1. Optimize diabetes control  Discharge Diagnoses:  Principal Problem:   Abdominal pain Active Problems:   Sarcoidosis   Essential hypertension   Diabetic retinopathy associated with type 2 diabetes mellitus   Bronchospasm, acute   Partial small bowel obstruction   Type 2 diabetes mellitus, uncontrolled with retinopathy   Peripheral edema   Dyspnea   Discharge Condition: stable  Filed Weights   09/13/14 0356 09/13/14 1100 09/17/14 0926  Weight: 72.576 kg (160 lb) 74.39 kg (164 lb) 79.379 kg (175 lb)    History of present illness:  64 y.o. male with a Past Medical History of pulmonary sarcoidosis, prior history of small bowel obstruction requiring exploratory laparotomy and lysis of adhesions in 2014, diabetes (noncompliant with insulin), hypertension who presents today with the above noted complaint. Per patient, he woke up around 1 AM this morning with severe abdominal pain. Patient describes the pain mostly in his lower abdomen, 10/10 at its severity. He thought he was constipated, and proceeded to give himself an enema with success, however it did not relieve the abdominal pain. There was no history of vomiting. Because of persistent severe pain, patient was brought to the emergency room. CT scan of the abdomen showed distention of small bowel loops, with fecal exertion of the lower abdomen. General surgery was consulted and recommended supportive care, I was asked to admit this patient for further evaluation and treatment.  Hospital Course:  partial small bowel obstruction NG tube placed. Supportive care. Resolved with conservative management. By discharge, having bowel movements, tolerating solid diet.  Dyspnea: developed dyspnea,  wheezing. CXR negative. Started on bronchodilators, IVF stopped and lasix given. By discharge, ambulating the halls without dyspnea. Normal lung sounds  Uncontrolled type 2 diabetes mellitus Worse on steroids. Required lantus and SSI. Admitted to having run out of most medications prior to admission. Rx given at discharge  Pulmonary sarcoidosis Follows with Dr. Melvyn Novas  Acute kidney injury Likely prerenal. Resolved with IV fluids. Recheck BMET  Essential hypertension outpatient meds resumed  GERD PPI resumed  Procedures:  none  Consultations:  general surgery  Discharge Exam: Filed Vitals:   09/18/14 0620  BP: 151/68  Pulse: 79  Temp: 97.5 F (36.4 C)  Resp: 19    General: a and o Cardiovascular: RRR Respiratory: CTA Abd: bowel sounds present, s, nt, nd Ext 1+ edema  Discharge Instructions   Discharge Instructions    Activity as tolerated - No restrictions    Complete by:  As directed      Amb Referral to Nutrition and Diabetic E    Complete by:  As directed   A1c 16%.  Takes Lantus, Metformin, and Glipizide at home.  PCP [name] with [facility].  Plans to see Dr. Meredith Pel for Endocrinology.  Please plan for 1:1 education session.  Thanks!     Diet - low sodium heart healthy    Complete by:  As directed      Diet Carb Modified    Complete by:  As directed           Current Discharge Medication List    CONTINUE these medications which have CHANGED   Details  cloNIDine (CATAPRES - DOSED IN MG/24 HR) 0.2 mg/24hr patch Place 1 patch (0.2 mg total) onto the skin  once a week. On thursdays Qty: 4 patch, Refills: 0    gabapentin (NEURONTIN) 300 MG capsule Take 1 capsule (300 mg total) by mouth 2 (two) times daily. Qty: 60 capsule, Refills: 0    glipiZIDE (GLUCOTROL) 10 MG tablet Take 1 tablet (10 mg total) by mouth 2 (two) times daily before a meal. Qty: 60 tablet, Refills: 0    metFORMIN (GLUCOPHAGE) 1000 MG tablet Take 1 tablet (1,000 mg total) by  mouth 2 (two) times daily with a meal. Qty: 60 tablet, Refills: 0    olmesartan (BENICAR) 20 MG tablet One half daily Qty: 30 tablet, Refills: 0      CONTINUE these medications which have NOT CHANGED   Details  aspirin 81 MG chewable tablet Chew 1 tablet (81 mg total) by mouth daily.    insulin glargine (LANTUS) 100 UNIT/ML injection Inject 5 Units into the skin at bedtime. Qty: 10 mL, Refills: 1    Multiple Vitamin (MULTIVITAMIN WITH MINERALS) TABS tablet Take 1 tablet by mouth daily.    omeprazole (PRILOSEC) 20 MG capsule Take 20 mg by mouth daily.      STOP taking these medications     amoxicillin-clavulanate (AUGMENTIN) 875-125 MG per tablet      lisinopril (PRINIVIL,ZESTRIL) 10 MG tablet        Allergies  Allergen Reactions  . Naproxen Anaphylaxis    REACTION: Throat swells and cannot breathe   Follow-up Information    Follow up with [name], MD.   Specialty:  Family Medicine   Contact information:   [place] [id number] [phone]        The results of significant diagnostics from this hospitalization (including imaging, microbiology, ancillary and laboratory) are listed below for reference.    Significant Diagnostic Studies: Ct Abdomen Pelvis W Contrast  09/13/2014   CLINICAL DATA:  Diffuse lower abdominal pain, with nausea and constipation. Initial encounter.  EXAM: CT ABDOMEN AND PELVIS WITH CONTRAST  TECHNIQUE: Multidetector CT imaging of the abdomen and pelvis was performed using the standard protocol following bolus administration of intravenous contrast.  CONTRAST:  119mL OMNIPAQUE IOHEXOL 300 MG/ML  SOLN  COMPARISON:  CT of the abdomen and pelvis from 08/30/2012, and CT of the chest performed 06/20/2006  FINDINGS: The visualized lung bases are clear. Enlarged periesophageal nodes and bilateral peribronchial nodes are again seen. These appear stable from CT of the chest dating back to 2007, and  may reflect sarcoidosis. Would correlate with patient's clinical diagnoses.  The liver and spleen are unremarkable in appearance. The gallbladder is within normal limits. The pancreas and adrenal glands are unremarkable.  Mild nonspecific perinephric stranding is noted bilaterally. The kidneys are otherwise unremarkable. There is no evidence of hydronephrosis. No renal or ureteral stones are seen.  Note is made of slight distention of small-bowel loops to 3.2 cm in maximal diameter, with gradual fecalization at the lower abdomen. Distal small bowel still contains a small amount of fluid. This likely reflects some degree of small bowel dysmotility. No associated inflammation is seen.  No free fluid is identified. The stomach is within normal limits. No acute vascular abnormalities are seen.  The appendix is normal in caliber, without evidence for appendicitis. The colon is unremarkable in appearance.  The bladder is mildly distended and grossly unremarkable. The patient is status post prostatectomy, with surrounding clips. There is no evidence of recurrent mass in this region. No inguinal lymphadenopathy is seen. The visualized  inguinal nodes are borderline normal in size.  No acute osseous abnormalities are identified. Vacuum phenomenon is noted at L3-L4 and L4-L5, with endplate sclerotic change seen at L4-L5. Mild underlying facet disease is noted at the lower lumbar spine.  IMPRESSION: 1. Slight distention of small bowel loops to 3.2 cm in maximal diameter, with gradual fecalization at the lower abdomen. Distal small bowel still contains a small amount of fluid. This likely reflects some degree of small bowel dysmotility. No associated inflammation seen. No evidence for obstruction. 2. Status post prostatectomy; no evidence of recurrent mass. 3. Enlarged periesophageal and bilateral peribronchial nodes again seen. These appear stable dating back to 2007, and may reflect sarcoidosis. Would correlate with the  patient's clinical diagnoses.   Electronically Signed   By: Garald Balding M.D.   On: 09/13/2014 07:35   Dg Chest Port 1 View  09/16/2014   CLINICAL DATA:  Shortness of breath.  EXAM: PORTABLE CHEST - 1 VIEW  COMPARISON:  01/15/2013 and 08/25/2012  FINDINGS: Lungs are adequately inflated with subtle bibasilar opacification suggesting atelectasis although cannot exclude developing infection. There is stable prominence of the AP window compatible with adenopathy in this known sarcoid patient. Cardiomediastinal silhouette and remainder of the exam is unchanged.  IMPRESSION: Minimal bibasilar opacification likely atelectasis although cannot exclude developing infection.  Stable AP window adenopathy compatible with known sarcoidosis.   Electronically Signed   By: [name] Olp M.D.   On: 09/16/2014 08:55   Dg Abd 2 Views  09/14/2014   CLINICAL DATA:  Followup small bowel obstruction. Left lower quadrant pain beginning 2 nights ago.  EXAM: ABDOMEN - 2 VIEW  COMPARISON:  CT, 09/13/2014.  FINDINGS: There is no radiographic evidence of a bowel obstruction. Residual contrast is noted in a normal caliber colon and in the rectum. Nasogastric tube tip lies in the mid to distal stomach.  There are surgical vascular clips the pelvis consistent with previous prostate surgery.  No free air.  IMPRESSION: Resolved small bowel obstruction.   Electronically Signed   By: Lajean Manes M.D.   On: 09/14/2014 09:10    Microbiology: No results found for this or any previous visit (from the past 240 hour(s)).   Labs: Basic Metabolic Panel:  Recent Labs Lab 09/13/14 0413 09/14/14 0515 09/17/14 1030  NA 132* 137 143  K 4.9 4.3 3.2*  CL 98 108 110  CO2 27 23 25   GLUCOSE 474* 124* 168*  BUN 16 12 7   CREATININE 1.42* 1.32 1.15  CALCIUM 9.9 8.6 9.0   Liver Function Tests:  Recent Labs Lab 09/13/14 0413  AST 26  ALT 17  ALKPHOS 184*  BILITOT 1.1  PROT 7.4  ALBUMIN 4.0    Recent Labs Lab 09/13/14 0413   LIPASE 22   No results for input(s): AMMONIA in the last 168 hours. CBC:  Recent Labs Lab 09/13/14 0413 09/14/14 0515 09/15/14 0529 09/18/14 0537  WBC 7.6 3.9* 4.1 7.1  NEUTROABS 6.5  --   --   --   HGB 13.4 12.4* 12.3* 11.6*  HCT 41.7 39.9 39.5 35.8*  MCV 65.1* 66.2* 66.8* 65.1*  PLT 149* 142* 135* 147*   Cardiac Enzymes: No results for input(s): CKTOTAL, CKMB, CKMBINDEX, TROPONINI in the last 168 hours. BNP: BNP (last 3 results)  Recent Labs  09/17/14 1032  BNP 287.0*    ProBNP (last 3 results) No results for input(s): PROBNP in the last 8760 hours.  CBG:  Recent Labs Lab 09/17/14 [phone] 09/17/14 1221  09/17/14 1756 09/17/14 2207 09/18/14 0805  GLUCAP 210* 109* 239* 279* 90       Signed:  Kass Herberger L  [facility] 09/18/2014, 11:22 AM

## 2014-10-09 ENCOUNTER — Ambulatory Visit: Payer: Medicare Other | Admitting: *Deleted

## 2014-10-30 ENCOUNTER — Ambulatory Visit: Payer: Medicare Other | Admitting: Internal Medicine

## 2014-11-04 ENCOUNTER — Ambulatory Visit: Payer: Medicare Other | Admitting: Internal Medicine

## 2014-11-11 ENCOUNTER — Ambulatory Visit: Payer: Medicare Other | Admitting: *Deleted

## 2014-11-21 ENCOUNTER — Ambulatory Visit: Payer: Medicare Other | Admitting: Internal Medicine

## 2014-12-11 ENCOUNTER — Encounter: Payer: Self-pay | Admitting: *Deleted

## 2014-12-11 ENCOUNTER — Ambulatory Visit (INDEPENDENT_AMBULATORY_CARE_PROVIDER_SITE_OTHER): Payer: Medicare Other | Admitting: Internal Medicine

## 2014-12-11 ENCOUNTER — Encounter: Payer: Self-pay | Admitting: Internal Medicine

## 2014-12-11 ENCOUNTER — Other Ambulatory Visit (INDEPENDENT_AMBULATORY_CARE_PROVIDER_SITE_OTHER): Payer: Medicare Other

## 2014-12-11 ENCOUNTER — Ambulatory Visit (INDEPENDENT_AMBULATORY_CARE_PROVIDER_SITE_OTHER)
Admission: RE | Admit: 2014-12-11 | Discharge: 2014-12-11 | Disposition: A | Discharge status: Home / Self Care | Payer: Medicare Other | Source: Ambulatory Visit | Attending: Internal Medicine | Admitting: Internal Medicine

## 2014-12-11 VITALS — BP 120/66 | HR 66 | Ht 66.0 in | Wt 178.0 lb

## 2014-12-11 DIAGNOSIS — R0600 Dyspnea, unspecified: Secondary | ICD-10-CM | POA: Diagnosis not present

## 2014-12-11 DIAGNOSIS — D869 Sarcoidosis, unspecified: Secondary | ICD-10-CM

## 2014-12-11 DIAGNOSIS — R05 Cough: Secondary | ICD-10-CM

## 2014-12-11 DIAGNOSIS — R059 Cough, unspecified: Secondary | ICD-10-CM

## 2014-12-11 LAB — CBC WITH DIFFERENTIAL/PLATELET
BASOS ABS: 0 10*3/uL (ref 0.0–0.1)
Basophils Relative: 0.7 % (ref 0.0–3.0)
EOS ABS: 0.1 10*3/uL (ref 0.0–0.7)
EOS PCT: 3.9 % (ref 0.0–5.0)
HCT: 40.7 % (ref 39.0–52.0)
Hemoglobin: 12.9 g/dL — ABNORMAL LOW (ref 13.0–17.0)
Lymphocytes Relative: 38.4 % (ref 12.0–46.0)
Lymphs Abs: 1.4 10*3/uL (ref 0.7–4.0)
MCHC: 31.7 g/dL (ref 30.0–36.0)
MCV: 66 fl — ABNORMAL LOW (ref 78.0–100.0)
Monocytes Absolute: 0.6 10*3/uL (ref 0.1–1.0)
Monocytes Relative: 15.7 % — ABNORMAL HIGH (ref 3.0–12.0)
Neutro Abs: 1.5 10*3/uL (ref 1.4–7.7)
Neutrophils Relative %: 41.3 % — ABNORMAL LOW (ref 43.0–77.0)
PLATELETS: 199 10*3/uL (ref 150.0–400.0)
RBC: 6.17 Mil/uL — AB (ref 4.22–5.81)
RDW: 16.1 % — ABNORMAL HIGH (ref 11.5–15.5)
WBC: 3.6 10*3/uL — AB (ref 4.0–10.5)

## 2014-12-11 MED ORDER — PANTOPRAZOLE SODIUM 40 MG PO TBEC: 40.0000 mg | DELAYED_RELEASE_TABLET | Freq: Every day | ORAL | Status: DC

## 2014-12-11 MED ORDER — FAMOTIDINE 20 MG PO TABS: ORAL_TABLET | ORAL | Status: DC

## 2014-12-11 NOTE — Patient Instructions (Addendum)
Avoid all calognes for now  GERD (REFLUX)  is an extremely common cause of respiratory symptoms just like yours , many times with no obvious heartburn at all.    It can be treated with medication, but also with lifestyle changes including avoidance of late meals, excessive alcohol, smoking cessation, and avoid fatty foods, chocolate, peppermint, colas, red wine, and acidic juices such as orange juice.  NO MINT OR MENTHOL PRODUCTS SO NO COUGH DROPS  USE SUGARLESS CANDY INSTEAD (Jolley ranchers or Stover's or Life Savers) or even ice chips will also do - the key is to swallow to prevent all throat clearing. NO OIL BASED VITAMINS - use powdered substitutes.    Pantoprazole (protonix) 40 mg   Take 30-60 min before first meal of the day and Pepcid 20 mg one bedtime until return to office - this is the best way to tell whether stomach acid is contributing to your problem.    Please remember to go to the lab and x-ray department downstairs for your tests - we will call you with the results when they are available.     See Tammy NP w/in 2 weeks with all your medications, even over the counter meds, separated in two separate bags, the ones you take no matter what vs the ones you stop once you feel better and take only as needed when you feel you need them.   Tammy  will generate for you a new user friendly medication calendar that will put [place] all on the same page re: your medication use.     Without this process, it simply isn't possible to assure that we are providing  your outpatient care  with  the attention to detail we feel you deserve.   If we cannot assure that you're getting that kind of care,  then we cannot manage your problem effectively from this clinic.  Once you have seen Tammy and we are sure that we're all on the same page with your medication use she will arrange follow up with me.  Late add eval with pfts/ walking sats  if not better on return

## 2014-12-11 NOTE — Progress Notes (Signed)
Subjective:    Patient ID: [name], male    DOB: [birth year]  MRN: [id number]  [name] is Primary    Brief patient profile:  63 yobm quit smoking 1968 dx sarcoid by [name]  2007  s/p lap Jan [facility] 2014 eval by [name] for post op dysphagia, sore throat hoarsness > [name] > [name] > consider bx of med adenopathy.     History of Present Illness  11/13/2012 1st  Pulmonary eval on ace c/o hoarseness,  Sensation of something stuck in throat and hard to swallow, dry cough p ng and ET in January 2014.  Sensation of choking and sob.   >>changed ACE to ARB.  01/15/2013 Follow up - reports cough has returned since running out of omeprazole and benicar 10days prior to OV   Feels much better , cough is decreased. Last visit seen for cough and hoarseness, changed from ACE to ARB. Started on PPI therapy.  Ran out of PPI and benicar ~10 days prior to OV   Dry cough starting to come back.  No fever, discolored mucus, n/v, rash, visual changes  rec Restart Benicar 20 mg one half daily Continue to hold fish oil for now Omeprazole 20 mg Take 30- 60 min before your first and last meals of the day until better then one daily before bfast   02/26/2013 f/u ov/[name] / fu/ cough and hilar adenopathy/  can't afford meds Chief Complaint  Patient presents with  . Follow-up    Pt states cough worse since the last visit- esp at night and is occ prod with light yellow sputum. He ran out of basically all meds about 2 wks ago   not on bp meds or gerd rx and worse since stopped. rec Omeprazole 20 mg Take 30- 60 min before your first and last meals of the day until better then one daily before bfast GERD diet     12/11/2014 f/u ov/[name] re: uacs s/p admit with NG in 09/2014 for sbo and breathing/cough worse since then  Chief Complaint  Patient presents with  . Acute Visit    Pt c/o increased SOB- notices when lifting something heavy or walking up stairs. He states that this has been going  on "for a while"- several months.     not taking gerd rx as rec/ extremely heavy cologne use    No obvious daytime variabilty or assoc excess or purulent sputum  cp or chest tightness, subjective wheeze overt sinus or hb symptoms. No unusual exp hx or h/o childhood pna/ asthma or knowledge of premature birth.   Also denies any obvious fluctuation of symptoms with weather or environmental changes or other aggravating or alleviating factors except as outlined above    Current Medications, Allergies, Past Medical History, Past Surgical History, Family History, and Social History were reviewed in [employer] record.  ROS  The following are not active complaints unless bolded sore throat, dysphagia, dental problems, itching, sneezing,  nasal congestion or excess/ purulent secretions, ear ache,   fever, chills, sweats, unintended wt loss, pleuritic or exertional cp, hemoptysis,  orthopnea pnd or leg swelling, presyncope, palpitations, heartburn, abdominal pain, anorexia, nausea, vomiting, diarrhea  or change in bowel or urinary habits, change in stools or urine, dysuria,hematuria,  rash, arthralgias, visual complaints, headache, numbness weakness or ataxia or problems with walking or coordination,  change in mood/affect or memory.            Objective:   Physical Exam  amb hoarse bm moderate pseudowheeze   Wt Readings from Last 3 Encounters:  12/11/14 178 lb (80.74 kg)  09/17/14 175 lb (79.379 kg)  02/26/13 168 lb 12.8 oz (76.567 kg)    Vital signs reviewed        HEENT: nl dentition, turbinates, and orophanx. Nl external ear canals without cough reflex   NECK :  without JVD/Nodes/TM/ nl carotid upstrokes bilaterally   LUNGS: no acc muscle use, clear to A and P bilaterally without cough on insp or exp maneuvers   CV:  RRR  no s3 or murmur or increase in P2, no edema   ABD:  soft and nontender with nl excursion in the supine position. No bruits or  organomegaly, bowel sounds nl  MS:  warm without deformities, calf tenderness, cyanosis or clubbing  SKIN: warm and dry without lesions       CXR PA and Lateral:   12/11/2014 :     I personally reviewed images and agree with radiology impression as follows:    There is no pneumonia nor CHF. Mild hilar lymph node enlargement persists likely reflecting known sarcoidosis.   Labs ordered/ reviewed:    Lab 12/11/14 1714  NA 138  K 4.6  CL 103  CO2 24  BUN 15  CREATININE 1.27  GLUCOSE 132*       Lab 12/11/14 1714  HGB 12.9*  HCT 40.7  WBC 3.6*  PLT 199.0     Lab Results  Component Value Date   TSH 1.44 12/11/2014     Lab Results  Component Value Date   PROBNP 22.0 12/11/2014                  Assessment & Plan:

## 2014-12-12 ENCOUNTER — Telehealth: Payer: Self-pay | Admitting: Internal Medicine

## 2014-12-12 ENCOUNTER — Encounter: Payer: Self-pay | Admitting: Internal Medicine

## 2014-12-12 LAB — BASIC METABOLIC PANEL
BUN: 15 mg/dL (ref 6–23)
CO2: 24 meq/L (ref 19–32)
Calcium: 10.3 mg/dL (ref 8.4–10.5)
Chloride: 103 mEq/L (ref 96–112)
Creatinine, Ser: 1.27 mg/dL (ref 0.40–1.50)
GFR: 73.07 mL/min (ref >60.00)
GLUCOSE: 132 mg/dL — AB (ref 70–99)
Potassium: 4.6 mEq/L (ref 3.5–5.1)
SODIUM: 138 meq/L (ref 135–145)

## 2014-12-12 LAB — BRAIN NATRIURETIC PEPTIDE: Pro B Natriuretic peptide (BNP): 22 pg/mL (ref 0.0–100.0)

## 2014-12-12 LAB — TSH: TSH: 1.44 u[IU]/mL (ref 0.35–4.50)

## 2014-12-12 NOTE — Telephone Encounter (Signed)
Pt calling about lab results - Nothing further needed.  Notes Recorded by Tanda Rockers, MD on 12/12/2014 at 2:02 PM Call patient : Studies are unremarkable, no change in recs (likely has thalassemia minor which is not clinically relevant)

## 2014-12-12 NOTE — Progress Notes (Signed)
Quick Note:  LMTCB ______ 

## 2014-12-12 NOTE — Telephone Encounter (Signed)
lmtcb

## 2014-12-12 NOTE — Telephone Encounter (Signed)
[phone], pt cb

## 2014-12-13 ENCOUNTER — Encounter: Payer: Self-pay | Admitting: Internal Medicine

## 2014-12-13 NOTE — Assessment & Plan Note (Signed)
:   Normal PFTs 07/27/06, including diffusion capacity ACE I level <3--low 2007 CT of chest for prostate cancer evaluation 06/2006 showed mediastinal and  bilateral hilar adenopathy consistent with pulmonary sarcoi  No evidence of active dz/ no need for systemic steroids here

## 2014-12-13 NOTE — Assessment & Plan Note (Signed)
No other explanation for sob except for UACS/ ? With vcd > eval with pfts/ walking sats  if not better with gerd rx in 2 weeks

## 2014-12-13 NOTE — Assessment & Plan Note (Signed)

## 2014-12-15 ENCOUNTER — Emergency Department (HOSPITAL_COMMUNITY): Payer: Medicare Other

## 2014-12-15 ENCOUNTER — Emergency Department (HOSPITAL_COMMUNITY)
Admission: EM | Admit: 2014-12-15 | Discharge: 2014-12-15 | Disposition: A | Discharge status: Home / Self Care | Payer: Medicare Other | Attending: Emergency Medicine | Admitting: Emergency Medicine

## 2014-12-15 ENCOUNTER — Encounter (HOSPITAL_COMMUNITY): Payer: Self-pay | Admitting: *Deleted

## 2014-12-15 DIAGNOSIS — Z794 Long term (current) use of insulin: Secondary | ICD-10-CM | POA: Insufficient documentation

## 2014-12-15 DIAGNOSIS — Y9389 Activity, other specified: Secondary | ICD-10-CM | POA: Insufficient documentation

## 2014-12-15 DIAGNOSIS — Y998 Other external cause status: Secondary | ICD-10-CM | POA: Insufficient documentation

## 2014-12-15 DIAGNOSIS — Z8669 Personal history of other diseases of the nervous system and sense organs: Secondary | ICD-10-CM | POA: Diagnosis not present

## 2014-12-15 DIAGNOSIS — Y92410 Unspecified street and highway as the place of occurrence of the external cause: Secondary | ICD-10-CM | POA: Insufficient documentation

## 2014-12-15 DIAGNOSIS — Z8719 Personal history of other diseases of the digestive system: Secondary | ICD-10-CM | POA: Insufficient documentation

## 2014-12-15 DIAGNOSIS — E11319 Type 2 diabetes mellitus with unspecified diabetic retinopathy without macular edema: Secondary | ICD-10-CM | POA: Insufficient documentation

## 2014-12-15 DIAGNOSIS — S4992XA Unspecified injury of left shoulder and upper arm, initial encounter: Secondary | ICD-10-CM | POA: Insufficient documentation

## 2014-12-15 DIAGNOSIS — R0602 Shortness of breath: Secondary | ICD-10-CM | POA: Diagnosis not present

## 2014-12-15 DIAGNOSIS — Z792 Long term (current) use of antibiotics: Secondary | ICD-10-CM | POA: Insufficient documentation

## 2014-12-15 DIAGNOSIS — Z8546 Personal history of malignant neoplasm of prostate: Secondary | ICD-10-CM | POA: Diagnosis not present

## 2014-12-15 DIAGNOSIS — Z87891 Personal history of nicotine dependence: Secondary | ICD-10-CM | POA: Diagnosis not present

## 2014-12-15 DIAGNOSIS — M25512 Pain in left shoulder: Secondary | ICD-10-CM

## 2014-12-15 DIAGNOSIS — Z79899 Other long term (current) drug therapy: Secondary | ICD-10-CM | POA: Diagnosis not present

## 2014-12-15 HISTORY — DX: Unspecified visual loss: H54.7

## 2014-12-15 LAB — CBC WITH DIFFERENTIAL/PLATELET
Basophils Absolute: 0 10*3/uL (ref 0.0–0.1)
Basophils Relative: 1 % (ref 0–1)
EOS ABS: 0.2 10*3/uL (ref 0.0–0.7)
Eosinophils Relative: 4 % (ref 0–5)
HCT: 38.1 % — ABNORMAL LOW (ref 39.0–52.0)
HEMOGLOBIN: 12.3 g/dL — AB (ref 13.0–17.0)
LYMPHS ABS: 1.3 10*3/uL (ref 0.7–4.0)
LYMPHS PCT: 32 % (ref 12–46)
MCH: 21.5 pg — ABNORMAL LOW (ref 26.0–34.0)
MCHC: 32.3 g/dL (ref 30.0–36.0)
MCV: 66.5 fL — ABNORMAL LOW (ref 78.0–100.0)
MONO ABS: 0.4 10*3/uL (ref 0.1–1.0)
MONOS PCT: 9 % (ref 3–12)
Neutro Abs: 2.2 10*3/uL (ref 1.7–7.7)
Neutrophils Relative %: 54 % (ref 43–77)
PLATELETS: 181 10*3/uL (ref 150–400)
RBC: 5.73 MIL/uL (ref 4.22–5.81)
RDW: 16.1 % — AB (ref 11.5–15.5)
WBC: 4.1 10*3/uL (ref 4.0–10.5)

## 2014-12-15 LAB — BASIC METABOLIC PANEL
ANION GAP: 8 (ref 5–15)
BUN: 12 mg/dL (ref 6–20)
CALCIUM: 9.7 mg/dL (ref 8.9–10.3)
CHLORIDE: 104 mmol/L (ref 101–111)
CO2: 27 mmol/L (ref 22–32)
Creatinine, Ser: 1.12 mg/dL (ref 0.61–1.24)
GFR calc non Af Amer: >60 mL/min (ref >60)
GLUCOSE: 241 mg/dL — AB (ref 70–99)
POTASSIUM: 4.5 mmol/L (ref 3.5–5.1)
Sodium: 139 mmol/L (ref 135–145)

## 2014-12-15 LAB — I-STAT TROPONIN, ED: Troponin i, poc: 0 ng/mL (ref 0.00–0.08)

## 2014-12-15 LAB — BRAIN NATRIURETIC PEPTIDE: B Natriuretic Peptide: 30.8 pg/mL (ref 0.0–100.0)

## 2014-12-15 MED ORDER — HYDROCODONE-ACETAMINOPHEN 5-325 MG PO TABS: 1.0000 | ORAL_TABLET | Freq: Four times a day (QID) | ORAL | Status: DC | PRN
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
## 2014-12-15 NOTE — Discharge Instructions (Signed)
Acromioclavicular Injuries The acromioclavicular [facility]) joint is the joint in the shoulder. There are many bands of tissue (ligaments) that surround the [facility] bones and joints. These bands of tissue can tear, which can lead to sprains and separations. The bones of the [facility] joint can also break (fracture).  HOME CARE   Put ice on the injured area.  Put ice in a plastic bag.  Place a towel between your skin and the bag.  Leave the ice on for 15-20 minutes, 03-04 times a day.  Wear your sling as told by your doctor. Remove the sling before showering and bathing. Keep the shoulder in the same place as when the sling is on. Do not lift the arm.  Gently tighten your figure-eight splint (if applied) every day. Tighten it enough to keep the shoulders held back. There should be room to place your finger between your body and the strap. Loosen the splint right away if you lose feeling (numbness) or have tingling in your hands.  Only take medicine as told by your doctor.  Keep all follow-up visits with your doctor. GET HELP RIGHT AWAY IF:   Your medicine does not help your pain.  You have more puffiness (swelling) or your bruising gets worse rather than better.  You were unable to follow up as told by your doctor.  You have tingling or lose even more feeling in your arm, forearm, or hand.  Your arm is cold or pale.  You have more pain in the hand, forearm, or fingers. MAKE SURE YOU:   Understand these instructions.  Will watch your condition.  Will get help right away if you are not doing well or get worse. Document Released: 01/19/2010 Document Revised: 10/24/2011 Document Reviewed: 01/19/2010 [facility] Patient Information 2015 [place], [place]. This information is not intended to replace advice given to you by your health care provider. Make sure you discuss any questions you have with your health care provider. Shortness of Breath Shortness of breath means you have trouble breathing. It  could also mean that you have a medical problem. You should get immediate medical care for shortness of breath. CAUSES   Not enough oxygen in the air such as with high altitudes or a smoke-filled room.  Certain lung diseases, infections, or problems.  Heart disease or conditions, such as angina or heart failure.  Low red blood cells (anemia).  Poor physical fitness, which can cause shortness of breath when you exercise.  Chest or back injuries or stiffness.  Being overweight.  Smoking.  Anxiety, which can make you feel like you are not getting enough air. DIAGNOSIS  Serious medical problems can often be found during your physical exam. Tests may also be done to determine why you are having shortness of breath. Tests may include:  Chest X-rays.  Lung function tests.  Blood tests.  An electrocardiogram (ECG).  An ambulatory electrocardiogram. An ambulatory ECG records your heartbeat patterns over a 24-hour period.  Exercise testing.  A transthoracic echocardiogram (TTE). During echocardiography, sound waves are used to evaluate how blood flows through your heart.  A transesophageal echocardiogram (TEE).  Imaging scans. Your health care provider may not be able to find a cause for your shortness of breath after your exam. In this case, it is important to have a follow-up exam with your health care provider as directed.  TREATMENT  Treatment for shortness of breath depends on the cause of your symptoms and can vary greatly. HOME CARE INSTRUCTIONS   Do not  smoke. Smoking is a common cause of shortness of breath. If you smoke, ask for help to quit.  Avoid being around chemicals or things that may bother your breathing, such as paint fumes and dust.  Rest as needed. Slowly resume your usual activities.  If medicines were prescribed, take them as directed for the full length of time directed. This includes oxygen and any inhaled medicines.  Keep all follow-up appointments  as directed by your health care provider. SEEK MEDICAL CARE IF:   Your condition does not improve in the time expected.  You have a hard time doing your normal activities even with rest.  You have any new symptoms. SEEK IMMEDIATE MEDICAL CARE IF:   Your shortness of breath gets worse.  You feel light-headed, faint, or develop a cough not controlled with medicines.  You start coughing up blood.  You have pain with breathing.  You have chest pain or pain in your arms, shoulders, or abdomen.  You have a fever.  You are unable to walk up stairs or exercise the way you normally do. MAKE SURE YOU:  Understand these instructions.  Will watch your condition.  Will get help right away if you are not doing well or get worse. Document Released: 04/26/2001 Document Revised: 08/06/2013 Document Reviewed: 10/17/2011 [facility] Patient Information 2015 [place], [place]. This information is not intended to replace advice given to you by your health care provider. Make sure you discuss any questions you have with your health care provider.

## 2014-12-15 NOTE — ED Notes (Signed)
Tried unsuccessfully x 2 for blood.   Called phlebotomy.

## 2014-12-15 NOTE — ED Notes (Signed)
MD Gentry at bedside.
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
## 2014-12-23 ENCOUNTER — Ambulatory Visit: Payer: Medicare Other | Admitting: *Deleted

## 2014-12-29 ENCOUNTER — Encounter: Payer: Medicare Other | Admitting: Adult Health

## 2015-01-20 ENCOUNTER — Ambulatory Visit: Payer: Medicare Other | Admitting: *Deleted

## 2015-02-10 ENCOUNTER — Encounter: Payer: Self-pay | Admitting: Nurse Practitioner

## 2015-04-08 ENCOUNTER — Emergency Department (HOSPITAL_COMMUNITY): Payer: Medicare Other

## 2015-04-08 ENCOUNTER — Inpatient Hospital Stay (HOSPITAL_COMMUNITY)
Admission: EM | Admit: 2015-04-08 | Discharge: 2015-04-10 | DRG: 389 | Disposition: A | Discharge status: Home / Self Care | Payer: Medicare Other | Attending: Student in an Organized Health Care Education/Training Program | Admitting: Student in an Organized Health Care Education/Training Program

## 2015-04-08 ENCOUNTER — Encounter (HOSPITAL_COMMUNITY): Payer: Self-pay | Admitting: Emergency Medicine

## 2015-04-08 ENCOUNTER — Inpatient Hospital Stay (HOSPITAL_COMMUNITY): Payer: Medicare Other

## 2015-04-08 DIAGNOSIS — E875 Hyperkalemia: Secondary | ICD-10-CM | POA: Diagnosis present

## 2015-04-08 DIAGNOSIS — Z888 Allergy status to other drugs, medicaments and biological substances status: Secondary | ICD-10-CM

## 2015-04-08 DIAGNOSIS — Z8546 Personal history of malignant neoplasm of prostate: Secondary | ICD-10-CM

## 2015-04-08 DIAGNOSIS — E871 Hypo-osmolality and hyponatremia: Secondary | ICD-10-CM | POA: Diagnosis present

## 2015-04-08 DIAGNOSIS — E860 Dehydration: Secondary | ICD-10-CM | POA: Diagnosis present

## 2015-04-08 DIAGNOSIS — I10 Essential (primary) hypertension: Secondary | ICD-10-CM | POA: Diagnosis present

## 2015-04-08 DIAGNOSIS — Z7982 Long term (current) use of aspirin: Secondary | ICD-10-CM | POA: Diagnosis not present

## 2015-04-08 DIAGNOSIS — E119 Type 2 diabetes mellitus without complications: Secondary | ICD-10-CM

## 2015-04-08 DIAGNOSIS — Z87891 Personal history of nicotine dependence: Secondary | ICD-10-CM | POA: Diagnosis not present

## 2015-04-08 DIAGNOSIS — D869 Sarcoidosis, unspecified: Secondary | ICD-10-CM | POA: Diagnosis present

## 2015-04-08 DIAGNOSIS — Z9079 Acquired absence of other genital organ(s): Secondary | ICD-10-CM | POA: Diagnosis present

## 2015-04-08 DIAGNOSIS — Z833 Family history of diabetes mellitus: Secondary | ICD-10-CM

## 2015-04-08 DIAGNOSIS — E271 Primary adrenocortical insufficiency: Secondary | ICD-10-CM | POA: Diagnosis not present

## 2015-04-08 DIAGNOSIS — E11319 Type 2 diabetes mellitus with unspecified diabetic retinopathy without macular edema: Secondary | ICD-10-CM | POA: Diagnosis present

## 2015-04-08 DIAGNOSIS — Z794 Long term (current) use of insulin: Secondary | ICD-10-CM | POA: Diagnosis not present

## 2015-04-08 DIAGNOSIS — E872 Acidosis: Secondary | ICD-10-CM | POA: Diagnosis present

## 2015-04-08 DIAGNOSIS — E785 Hyperlipidemia, unspecified: Secondary | ICD-10-CM | POA: Diagnosis present

## 2015-04-08 DIAGNOSIS — Z79899 Other long term (current) drug therapy: Secondary | ICD-10-CM | POA: Diagnosis not present

## 2015-04-08 DIAGNOSIS — E2740 Unspecified adrenocortical insufficiency: Secondary | ICD-10-CM | POA: Diagnosis present

## 2015-04-08 DIAGNOSIS — E1165 Type 2 diabetes mellitus with hyperglycemia: Secondary | ICD-10-CM | POA: Diagnosis present

## 2015-04-08 DIAGNOSIS — K5669 Other intestinal obstruction: Principal | ICD-10-CM | POA: Diagnosis present

## 2015-04-08 DIAGNOSIS — K56600 Partial intestinal obstruction, unspecified as to cause: Secondary | ICD-10-CM | POA: Diagnosis present

## 2015-04-08 DIAGNOSIS — IMO0002 Reserved for concepts with insufficient information to code with codable children: Secondary | ICD-10-CM

## 2015-04-08 DIAGNOSIS — K56609 Unspecified intestinal obstruction, unspecified as to partial versus complete obstruction: Secondary | ICD-10-CM

## 2015-04-08 HISTORY — DX: Gastro-esophageal reflux disease without esophagitis: K21.9

## 2015-04-08 HISTORY — DX: Type 2 diabetes mellitus with unspecified diabetic retinopathy without macular edema: E11.319

## 2015-04-08 LAB — BASIC METABOLIC PANEL
Anion gap: 8 (ref 5–15)
BUN: 15 mg/dL (ref 6–20)
CHLORIDE: 102 mmol/L (ref 101–111)
CO2: 26 mmol/L (ref 22–32)
CREATININE: 1.23 mg/dL (ref 0.61–1.24)
Calcium: 9.1 mg/dL (ref 8.9–10.3)
GFR calc Af Amer: >60 mL/min (ref >60)
GFR calc non Af Amer: 60 mL/min — ABNORMAL LOW (ref >60)
Glucose, Bld: 217 mg/dL — ABNORMAL HIGH (ref 65–99)
POTASSIUM: 5 mmol/L (ref 3.5–5.1)
Sodium: 136 mmol/L (ref 135–145)

## 2015-04-08 LAB — URINALYSIS, ROUTINE W REFLEX MICROSCOPIC
BILIRUBIN URINE: NEGATIVE
Glucose, UA: >1000 mg/dL — AB
HGB URINE DIPSTICK: NEGATIVE
KETONES UR: 40 mg/dL — AB
Leukocytes, UA: NEGATIVE
Nitrite: NEGATIVE
PROTEIN: 30 mg/dL — AB
Specific Gravity, Urine: 1.034 — ABNORMAL HIGH (ref 1.005–1.030)
UROBILINOGEN UA: 0.2 mg/dL (ref 0.0–1.0)
pH: 7.5 (ref 5.0–8.0)

## 2015-04-08 LAB — CBC
HEMATOCRIT: 44.9 % (ref 39.0–52.0)
Hemoglobin: 14.7 g/dL (ref 13.0–17.0)
MCH: 21.3 pg — ABNORMAL LOW (ref 26.0–34.0)
MCHC: 32.7 g/dL (ref 30.0–36.0)
MCV: 65 fL — ABNORMAL LOW (ref 78.0–100.0)
Platelets: 140 10*3/uL — ABNORMAL LOW (ref 150–400)
RBC: 6.91 MIL/uL — ABNORMAL HIGH (ref 4.22–5.81)
RDW: 14.9 % (ref 11.5–15.5)
WBC: 9.1 10*3/uL (ref 4.0–10.5)

## 2015-04-08 LAB — URINE MICROSCOPIC-ADD ON

## 2015-04-08 LAB — COMPREHENSIVE METABOLIC PANEL
ALT: 27 U/L (ref 17–63)
ANION GAP: 12 (ref 5–15)
AST: 29 U/L (ref 15–41)
Albumin: 4.5 g/dL (ref 3.5–5.0)
Alkaline Phosphatase: 155 U/L — ABNORMAL HIGH (ref 38–126)
BUN: 18 mg/dL (ref 6–20)
CO2: 26 mmol/L (ref 22–32)
Calcium: 9.9 mg/dL (ref 8.9–10.3)
Chloride: 92 mmol/L — ABNORMAL LOW (ref 101–111)
Creatinine, Ser: 1.39 mg/dL — ABNORMAL HIGH (ref 0.61–1.24)
GFR calc Af Amer: 60 mL/min — ABNORMAL LOW (ref >60)
GFR, EST NON AFRICAN AMERICAN: 52 mL/min — AB (ref >60)
Glucose, Bld: 432 mg/dL — ABNORMAL HIGH (ref 65–99)
POTASSIUM: 5.2 mmol/L — AB (ref 3.5–5.1)
Sodium: 130 mmol/L — ABNORMAL LOW (ref 135–145)
TOTAL PROTEIN: 7.7 g/dL (ref 6.5–8.1)
Total Bilirubin: 1.5 mg/dL — ABNORMAL HIGH (ref 0.3–1.2)

## 2015-04-08 LAB — GLUCOSE, CAPILLARY
GLUCOSE-CAPILLARY: 129 mg/dL — AB (ref 65–99)
Glucose-Capillary: 221 mg/dL — ABNORMAL HIGH (ref 65–99)
Glucose-Capillary: 153 mg/dL — ABNORMAL HIGH (ref 65–99)

## 2015-04-08 LAB — I-STAT CG4 LACTIC ACID, ED
LACTIC ACID, VENOUS: 1.6 mmol/L (ref 0.5–2.0)
LACTIC ACID, VENOUS: 2.63 mmol/L — AB (ref 0.5–2.0)

## 2015-04-08 LAB — LACTIC ACID, PLASMA: Lactic Acid, Venous: 2.3 mmol/L (ref 0.5–2.0)

## 2015-04-08 LAB — GAMMA GT: GGT: 28 U/L (ref 7–50)

## 2015-04-08 LAB — LIPASE, BLOOD: Lipase: 13 U/L — ABNORMAL LOW (ref 22–51)

## 2015-04-08 MED ORDER — ALBUTEROL SULFATE (2.5 MG/3ML) 0.083% IN NEBU: 2.5000 mg | INHALATION_SOLUTION | Freq: Four times a day (QID) | RESPIRATORY_TRACT | Status: DC | PRN

## 2015-04-08 MED ORDER — ATORVASTATIN CALCIUM 10 MG PO TABS
10.0000 mg | ORAL_TABLET | Freq: Every day | ORAL | Status: DC
  Administered 2015-04-09 – 2015-04-10 (×2): 10 mg via ORAL
  Filled 2015-04-08 (×2): qty 1

## 2015-04-08 MED ORDER — MORPHINE SULFATE (PF) 2 MG/ML IV SOLN: 1.0000 mg | Freq: Four times a day (QID) | INTRAVENOUS | Status: DC | PRN

## 2015-04-08 MED ORDER — SODIUM CHLORIDE 0.9 % IV BOLUS (SEPSIS)
1000.0000 mL | Freq: Once | INTRAVENOUS | Status: AC
  Administered 2015-04-08: 1000 mL via INTRAVENOUS

## 2015-04-08 MED ORDER — DIATRIZOATE MEGLUMINE & SODIUM 66-10 % PO SOLN
ORAL | Status: AC
  Filled 2015-04-08: qty 90

## 2015-04-08 MED ORDER — MORPHINE SULFATE (PF) 4 MG/ML IV SOLN
4.0000 mg | Freq: Once | INTRAVENOUS | Status: AC
  Administered 2015-04-08: 4 mg via INTRAVENOUS
  Filled 2015-04-08: qty 1

## 2015-04-08 MED ORDER — CLONIDINE HCL 0.2 MG/24HR TD PTWK
0.2000 mg | MEDICATED_PATCH | TRANSDERMAL | Status: DC
  Administered 2015-04-08: 0.2 mg via TRANSDERMAL
  Filled 2015-04-08: qty 1

## 2015-04-08 MED ORDER — ONDANSETRON 4 MG PO TBDP
4.0000 mg | ORAL_TABLET | Freq: Once | ORAL | Status: AC | PRN
  Administered 2015-04-08: 4 mg via ORAL

## 2015-04-08 MED ORDER — ONDANSETRON 4 MG PO TBDP
ORAL_TABLET | ORAL | Status: AC
  Filled 2015-04-08: qty 1

## 2015-04-08 MED ORDER — ALBUTEROL SULFATE HFA 108 (90 BASE) MCG/ACT IN AERS: 1.0000 | INHALATION_SPRAY | Freq: Four times a day (QID) | RESPIRATORY_TRACT | Status: DC | PRN

## 2015-04-08 MED ORDER — GABAPENTIN 300 MG PO CAPS
300.0000 mg | ORAL_CAPSULE | Freq: Two times a day (BID) | ORAL | Status: DC
  Administered 2015-04-08 – 2015-04-10 (×4): 300 mg via ORAL
  Filled 2015-04-08 (×4): qty 1

## 2015-04-08 MED ORDER — IOHEXOL 300 MG/ML  SOLN
100.0000 mL | Freq: Once | INTRAMUSCULAR | Status: AC | PRN
  Administered 2015-04-08: 100 mL via INTRAVENOUS

## 2015-04-08 MED ORDER — INSULIN ASPART 100 UNIT/ML ~~LOC~~ SOLN
0.0000 [IU] | Freq: Three times a day (TID) | SUBCUTANEOUS | Status: DC
  Administered 2015-04-08: 1 [IU] via SUBCUTANEOUS
  Administered 2015-04-08: 3 [IU] via SUBCUTANEOUS
  Administered 2015-04-09: 2 [IU] via SUBCUTANEOUS
  Administered 2015-04-09: 1 [IU] via SUBCUTANEOUS
  Administered 2015-04-10: 7 [IU] via SUBCUTANEOUS
  Administered 2015-04-10: 3 [IU] via SUBCUTANEOUS

## 2015-04-08 MED ORDER — PANTOPRAZOLE SODIUM 40 MG PO TBEC
40.0000 mg | DELAYED_RELEASE_TABLET | Freq: Every day | ORAL | Status: DC
  Administered 2015-04-09 – 2015-04-10 (×2): 40 mg via ORAL
  Filled 2015-04-08 (×2): qty 1

## 2015-04-08 MED ORDER — DIATRIZOATE MEGLUMINE & SODIUM 66-10 % PO SOLN
90.0000 mL | Freq: Once | ORAL | Status: AC
  Administered 2015-04-08: 90 mL via NASOGASTRIC

## 2015-04-08 MED ORDER — ASPIRIN 81 MG PO CHEW
81.0000 mg | CHEWABLE_TABLET | Freq: Every day | ORAL | Status: DC
  Administered 2015-04-09 – 2015-04-10 (×2): 81 mg via ORAL
  Filled 2015-04-08 (×2): qty 1

## 2015-04-08 MED ORDER — ENOXAPARIN SODIUM 40 MG/0.4ML ~~LOC~~ SOLN
40.0000 mg | SUBCUTANEOUS | Status: DC
  Administered 2015-04-08 – 2015-04-09 (×2): 40 mg via SUBCUTANEOUS
  Filled 2015-04-08 (×2): qty 0.4

## 2015-04-08 MED ORDER — SODIUM CHLORIDE 0.9 % IV SOLN
INTRAVENOUS | Status: AC
Start: 2015-04-08 — End: 2015-04-09
  Administered 2015-04-08: 150 mL/h via INTRAVENOUS
  Administered 2015-04-08 – 2015-04-09 (×2): via INTRAVENOUS

## 2015-04-08 MED ORDER — INSULIN GLARGINE 100 UNIT/ML ~~LOC~~ SOLN
5.0000 [IU] | Freq: Every day | SUBCUTANEOUS | Status: DC
  Administered 2015-04-08 – 2015-04-09 (×2): 5 [IU] via SUBCUTANEOUS
  Filled 2015-04-08 (×3): qty 0.05

## 2015-04-08 MED ORDER — ONDANSETRON HCL 4 MG/2ML IJ SOLN
4.0000 mg | Freq: Once | INTRAMUSCULAR | Status: AC
  Administered 2015-04-08: 4 mg via INTRAVENOUS
  Filled 2015-04-08: qty 2

## 2015-04-08 MED ORDER — LOSARTAN POTASSIUM 25 MG PO TABS
25.0000 mg | ORAL_TABLET | Freq: Every day | ORAL | Status: DC
  Administered 2015-04-09 – 2015-04-10 (×2): 25 mg via ORAL
  Filled 2015-04-08 (×2): qty 1

## 2015-04-08 MED ORDER — MORPHINE SULFATE (PF) 2 MG/ML IV SOLN
1.0000 mg | INTRAVENOUS | Status: DC | PRN
  Administered 2015-04-08: 1 mg via INTRAVENOUS
  Filled 2015-04-08 (×2): qty 1

## 2015-04-08 NOTE — H&P (Signed)
Internal Medicine Attending Admission Note  I saw and evaluated the patient. I reviewed the resident's note and I agree with the resident's findings and plan as documented in the resident's note.  Assessment & Plan by Problem:  Principal Problem:   Partial small bowel obstruction Active Problems:   Sarcoidosis   Diabetes mellitus   Essential hypertension   Partial SBO:  Clinical course, exam and imaging findings suggest partial or early SBO. He has appropriate bowel sounds so I doubt illeus. Currently symptoms are stable without NG tube decompression, he would like to avoid this for as long as possible. Reason for SBO could be recurrent adhesions. I wonder if underlying sarcoidosis could predispose to SBO as he is not currently on treatment. Plan for supportive care, NPO, IV hydration, NG tube if his nausea and vomiting become severe, surgery following.    Chief Complaint(s):  Abdominal pain  History - key components related to admission:  64 year old man with a recent 2 week admission for SBO requiring laparotomy and lyses of adhesions, presented to ED last night with sudden onset abdominal pain, nausea and vomiting. Started last night at 10pm. Feeling well leading up to this. Last BM yesterday. Eating and drinking normally. Abdominal pain was diffuse, sharp, sudden, constant. Now still passing gas. Nausea is a little improved, not hungry yet. No recent fevers or chills. No changes in his medications.   Lab results: Reviewed in Epic  Physical Exam - key components related to admission:  Filed Vitals:   04/08/15 1215 04/08/15 1217 04/08/15 1219 04/08/15 1537  BP: 145/64 143/60 137/59 140/63  Pulse: 69 72 70 67  Temp:    98.4 F (36.9 C)  TempSrc:    Oral  Resp:      Height:      Weight:      SpO2:    100%    Gen: Uncomfortable appearing, lying in bed ENT: MMM CV: RRR, no murmurs Lungs: Unlabored, CTAB Abd: Soft, non-distended, mild tenderness throughout, normal bowel sounds,  no rebound or gaurding Ext: Warm and well perfused, no edema

## 2015-04-08 NOTE — Progress Notes (Signed)
Utilization Review completed. Niya Behler RN BSN CM 

## 2015-04-08 NOTE — H&P (Signed)
Date: 04/08/2015               Patient Name:  [name] MRN: [id number]  DOB: 1951-04-05 Age / Sex: 64 y.o., male   PCP: Berkley Harvey, NP         Medical Service: Internal Medicine Teaching Service         Attending Physician: Dr. Axel Filler, MD    First Contact: Dr. Frances Furbish Pager: [phone]  Second Contact: Dr. Heber [place] Pager: [phone]       After Hours (After 5p/  First Contact Pager: [phone]  weekends / holidays): Second Contact Pager: [phone]   Chief Complaint: Abdominal pain and vomiting  History of Present Illness: 39 Y O M with PMH of Partial Small bowel obstruction, Prostate Cancer s/p prostatectomy, Sarcoidosis, DM2, HLD, HTN, presented with c/o Abdominal pain that started at about 10pm  after he had dinner prepared by his wife. He says the pain was generalized and constant. He then had a good bowel movement. He has had similair pains in the past when he had small bowel obstruction. He gave himself an enema with good results. But abdominal pain persisted, he and the he had about 5-6 episodes of vomiting early this morning , which was non bloody and consisted of recently ingested food. Abdominal pian was relived by Morphine in the ED. Pt also endorses associated abdominal distension, he says it was noticed more by his wife, this improved after the bowel movement, but now he feels his abdomen is back to baseline. He denies fever, has 2 episodes of dizziness last night.  It appears per chart patient has had previous episodes of something similar. Previous abdominal surgeries prior to first episode of similar presentation- Prostate and ventral hernia surgery. Admission  In 2011- CT scan noted no evidence of obstruction. Presentation in 2014- Ct scan showed evidence of bowel obstruction, for which he had exploratory lap  With lysis of adhesions in 2014.    Meds: Current Facility-Administered Medications  Medication Dose Route Frequency Provider Last Rate Last  Dose  . 0.9 %  sodium chloride infusion   Intravenous Continuous Dailah Opperman E Kaylin Schellenberg, MD      . albuterol (PROVENTIL HFA;VENTOLIN HFA) 108 (90 BASE) MCG/ACT inhaler 1-2 puff  1-2 puff Inhalation Q6H PRN Starlina Lapre E Genavie Boettger, MD      . aspirin chewable tablet 81 mg  81 mg Oral Daily Bethena Roys, MD   Stopped at 04/08/15 0945  . atorvastatin (LIPITOR) tablet 10 mg  10 mg Oral Daily Bethena Roys, MD   Stopped at 04/08/15 1003  . cloNIDine (CATAPRES - Dosed in mg/24 hr) patch 0.2 mg  0.2 mg Transdermal Weekly Kaydence Menard E Teriana Danker, MD      . enoxaparin (LOVENOX) injection 40 mg  40 mg Subcutaneous Q24H Zeba Luby E Hattye Siegfried, MD      . gabapentin (NEURONTIN) capsule 300 mg  300 mg Oral BID Bethena Roys, MD   Stopped at 04/08/15 0946  . insulin aspart (novoLOG) injection 0-9 Units  0-9 Units Subcutaneous TID WC Damonie Ellenwood E Jacorey Donaway, MD      . insulin glargine (LANTUS) injection 5 Units  5 Units Subcutaneous QHS Erhard Senske E Burnett Lieber, MD      . losartan (COZAAR) tablet 25 mg  25 mg Oral Daily Bethena Roys, MD   Stopped at 04/08/15 1003  . morphine 2 MG/ML injection 1 mg  1 mg Intravenous Q4H PRN Treshaun Carrico Arlyce Dice, MD   1  mg at 04/08/15 1003  . pantoprazole (PROTONIX) EC tablet 40 mg  40 mg Oral Daily Bethena Roys, MD   Stopped at 04/08/15 [id number]   Current Outpatient Prescriptions  Medication Sig Dispense Refill  . albuterol (PROVENTIL HFA;VENTOLIN HFA) 108 (90 BASE) MCG/ACT inhaler Inhale 1-2 puffs into the lungs every 6 (six) hours as needed for wheezing or shortness of breath.    [name] aspirin 81 MG chewable tablet Chew 81 mg by mouth daily.    [name] atorvastatin (LIPITOR) 10 MG tablet Take 10 mg by mouth daily.    . cloNIDine (CATAPRES - DOSED IN MG/24 HR) 0.2 mg/24hr patch Place 0.2 mg onto the skin once a week.    . famotidine (PEPCID) 20 MG tablet One at bedtime 30 tablet 2  . gabapentin (NEURONTIN) 300 MG capsule Take 1 capsule (300 mg total) by mouth 2 (two)  times daily. 60 capsule 0  . glipiZIDE (GLUCOTROL) 10 MG tablet Take 1 tablet (10 mg total) by mouth 2 (two) times daily before a meal. 60 tablet 0  . HYDROcodone-acetaminophen (NORCO/VICODIN) 5-325 MG per tablet Take 1-2 tablets by mouth every 6 (six) hours as needed. (Patient taking differently: Take 1-2 tablets by mouth every 6 (six) hours as needed for moderate pain. ) 10 tablet 0  . insulin glargine (LANTUS) 100 UNIT/ML injection Inject 5 Units into the skin at bedtime. 10 mL 1  . Lancets (FREESTYLE) lancets Use as instructed 100 each 0  . losartan (COZAAR) 25 MG tablet Take 25 mg by mouth daily.    . metFORMIN (GLUCOPHAGE) 1000 MG tablet Take 1 tablet (1,000 mg total) by mouth 2 (two) times daily with a meal. 60 tablet 0  . Multiple Vitamin (MULTIVITAMIN WITH MINERALS) TABS tablet Take 1 tablet by mouth daily.    . pantoprazole (PROTONIX) 40 MG tablet Take 1 tablet (40 mg total) by mouth daily. Take 30-60 min before first meal of the day 30 tablet 2  . Polyethyl Glycol-Propyl Glycol (SYSTANE OP) Place 1 drop into both eyes daily as needed (dry eyes).      Allergies: Allergies as of 04/08/2015 - Review Complete 04/08/2015  Allergen Reaction Noted  . Naproxen Anaphylaxis    Past Medical History  Diagnosis Date  . Diabetes mellitus without complication   . Cancer   . Prostate cancer   . Fluid overload 09/03/2012    Post op  . Ileus following gastrointestinal surgery 09/03/2012  . Diabetic retinopathy associated with type 2 diabetes mellitus 09/13/2014    He works for [employer] for [employer]  . Visual impairment    Past Surgical History  Procedure Laterality Date  . Hernia repair    . Prostate surgery    . Shoulder surgery    . Laparotomy  08/23/2012    Procedure: EXPLORATORY LAPAROTOMY;  Surgeon: [name], DO;  Location: [facility];  Service: General;  Laterality: N/A;  exploratory laparotomy with lysis of adhesions  . Lysis of adhesion  08/23/2012    Procedure: LYSIS OF ADHESION;   Surgeon: [name], DO;  Location: [facility];  Service: General;;  . Abdominal surgery     Family History  Problem Relation Age of Onset  . Diabetes Mellitus II Mother    Social History   Social History  . Marital Status: Married    Spouse Name: N/A  . Number of Children: N/A  . Years of Education: N/A   Occupational History  . Not on file.   Social History Main  Topics  . Smoking status: Former Smoker    Types: Cigarettes    Quit date: 11/09/1966  . Smokeless tobacco: Never Used  . Alcohol Use: Yes     Comment: SPARINGLY  . Drug Use: Not on file  . Sexual Activity: No   Other Topics Concern  . Not on file   Social History Narrative    Review of Systems: CONSTITUTIONAL- No Fever, no weightloss. SKIN- No Rash, colour changes or itching HEAD- one episode of Headache, with 2 episodes of dizziness. RESPIRATORY- No Cough or SOB. CARDIAC- No  chest pain. URINARY- No Frequency, or dysuria. NEUROLOGIC- No Numbness, syncope, seizures or burning.  Physical Exam: Blood pressure 152/61, pulse 82, temperature 98.2 F (36.8 C), temperature source Oral, resp. rate 18, height 5\' 6"  (1.676 m), weight 170 lb 12.8 oz (77.474 kg), SpO2 97 %. GENERAL- alert, co-operative, appears as stated age, not in any distress. HEENT- Atraumatic, normocephalic, Pupils pinpoint, likely from opioids, oral mucosa appears dry. CARDIAC- RRR, no murmurs, rubs or gallops. RESP- Moving equal volumes of air, and clear to auscultation bilaterally, no wheezes or crackles. ABDOMEN- Soft, generalized tenderness with mild guarding, no palpable masses or organomegaly, bowel sounds present. BACK- Normal curvature of the spine, No tenderness along the vertebrae, no CVA tenderness, has a ~8 by ~6 cm swelling, non tender, slightly more red than surrounding skin, on back of neck, firm, not fluctuant, chronic, swelling, but pt says he thinks it is increasing in size. NEURO- No obvious Cr N abnormality,moving all  extremities SKIN- Warm, dry, No rash or lesion. PSYCH- Normal mood and affect, appropriate thought content and speech.  Lab results: Basic Metabolic Panel:  Recent Labs  04/08/15 0120  NA 130*  K 5.2*  CL 92*  CO2 26  GLUCOSE 432*  BUN 18  CREATININE 1.39*  CALCIUM 9.9   Liver Function Tests:  Recent Labs  04/08/15 0120  AST 29  ALT 27  ALKPHOS 155*  BILITOT 1.5*  PROT 7.7  ALBUMIN 4.5    Recent Labs  04/08/15 0120  LIPASE 13*   CBC:  Recent Labs  04/08/15 0120  WBC 9.1  HGB 14.7  HCT 44.9  MCV 65.0*  PLT 140*   Urinalysis:  Recent Labs  04/08/15 0456  COLORURINE YELLOW  LABSPEC 1.034*  PHURINE 7.5  GLUCOSEU >1000*  HGBUR NEGATIVE  BILIRUBINUR NEGATIVE  KETONESUR 40*  PROTEINUR 30*  UROBILINOGEN 0.2  NITRITE NEGATIVE  LEUKOCYTESUR NEGATIVE   Imaging results:  Ct Abdomen Pelvis W Contrast  04/08/2015   CLINICAL DATA:  Abdominal pain with nausea, vomiting and diarrhea started tonight. History of bowel obstruction.  EXAM: CT ABDOMEN AND PELVIS WITH CONTRAST  TECHNIQUE: Multidetector CT imaging of the abdomen and pelvis was performed using the standard protocol following bolus administration of intravenous contrast.  CONTRAST:  189mL OMNIPAQUE IOHEXOL 300 MG/ML  SOLN  COMPARISON:  CT 09/13/2014  FINDINGS: Lower chest: The included lung bases are clear. Prominent subcarinal lymph node appears unchanged from prior.  Liver: Normal in size without focal lesion.  Hepatobiliary: Gallbladder physiologically distended. No pericholecystic inflammatory change or calcified gallstone. No biliary dilatation.  Pancreas: Normal.  Spleen: Normal.  Adrenal glands: No nodule.  Mild thickening on the left.  Kidneys: Symmetric renal enhancement. No hydronephrosis. 10 mm cyst in the upper right kidney.  Stomach/Bowel: Stomach physiologically distended with fluid. Small hiatal hernia. Fluid-filled prominent proximal small bowel loops, with jejunal bowel loops measuring up to  3.2 cm in the left abdomen.  Mild feculization of small bowel contents. Distal small bowel loops are decompressed, transition point is seen in the mid lower abdomen, image 47/88. Small volume of stool throughout the colon without colonic wall thickening. The appendix is normal.  Vascular/Lymphatic: No retroperitoneal adenopathy. Abdominal aorta is normal in caliber. Mild atherosclerosis of the abdominal aorta and its branches.  Reproductive: Surgical clips in the region of the prostate bed.  Bladder: Physiologically distended.  Other: No free air, free fluid, or intra-abdominal fluid collection. No mesenteric edema.  Musculoskeletal: There are no acute or suspicious osseous abnormalities. Degenerative change in the lumbar spine. Degenerative change versus avascular necrosis involving the right femoral head.  IMPRESSION: Similar small bowel distention proximally with distal decompression, transition point in the lower mid abdomen. This may reflect very early small bowel obstruction. Overall appearance is similar to that of prior exam.   Electronically Signed   By: Jeb Levering M.D.   On: 04/08/2015 07:03    Other results: EKG: None  Assessment & Plan by Problem: Principal Problem:   Partial small bowel obstruction Active Problems:   Sarcoidosis   Diabetes mellitus   Essential hypertension  Partial Small Bowel Obstruction- hx of abdominal surgeries- ventral hernia repair, prostatectomy and then later lysis of adhesions- 2014 after he came in with small bowel obstruction, all of which are risk factors for recurrent episodes of SBO. So far Ct scan showing Early Small bowel obstruction, no masses identified in the pt with hx of prostate cancer. Presentation not typical at this time with presence of bowel movements, unless he is having post- obstructive diarrhea, due to liquid faeces passing around obstruction. No sign of acute abdomen.  - Admit to med- surg - Surgery consulted in the ED, recs  appreciated. - NPO for now, consider advancing to small amount of clear fluids later. - Will not pass NG tube as abdomen does not appear distended, and pt is having bowel movements - IVF N/s at 100cc/hr for 12hrs - Morphine- 1mg  Q4H for pain, will limit opioid use, as this can worsen ileus  Dizziness and lactic acidosis- likely due to dehydration, initial lactic acid normal at 1.6 increased to 2.63 . From vomiting.  - Repeat Lactic acid - Orthostatic vitals- negative - EKG - IVF - PT.  Diabetes- Stated as uncontrolled in chart. Last Hgba1c- 16- checked in 08/2014. Home meds- Lantus 5u daily, metformin and glipizide. - SSI- s - Cont Home lantus at 5u daily  Elevated ALP- Not new. Other liver enzymes normal. Hx of prostate cancer, weight appears stable, but likely from bone.  - GGT ( If normal with normal Ca, then consider Pagets, doubt mets without Bone pain)  Mild Hyperkalemia-  K is elevated- 5.2. Likely spurious. Hypokalemia would contribute to ileus not hyperkalemia. Kidney function- slight elevated Cr- 1.39 but appears to be at baseline. Low Na like- Pseudo, likely due to Hyperglycemia.  - Repeat Bmet- Na, K , cr - Consider checking Am cortisol  Dispo: Disposition is deferred at this time, awaiting improvement of current medical problems.   The patient does have a current PCP Berkley Harvey, NP) and does need an [facility] hospital follow-up appointment after discharge.  The patient does not know have transportation limitations that hinder transportation to clinic appointments.  Signed: Bethena Roys, MD 04/08/2015, 10:04 AM

## 2015-04-08 NOTE — Consult Note (Signed)
[name] May 13, 1951  [id number].   Primary Care MD: [name], NP Requesting MD: Dr. Thayer Jew Chief Complaint/Reason for Consult: psbo HPI: This is a pleasant 64 yo black male who has a history or ex lap with LOA as well as prostatectomy who was admitted earlier this year for a PSBO, but resolved with conservative management.  Last night at supper he ate broccoli, green beans, peas, navy beans, and meatballs.  After this he felt bloated. He developed nausea and vomiting of a lot of his meal.  He gave himself an enema which helped his bloating, but then he developed worsening abdominal pain.  His pain was diffuse, but greatest in his mid abdomen.  His last episode of emesis was around 0100.  He came to the [facility] for evaluation.  He has a CT scan that reveals similar findings to January's scan, but concerns for possible early obstruction.  We have been asked to evaluate the patient for further recommendations.  ROS : Please see HPI, otherwise negative  Family History  Problem Relation Age of Onset  . Diabetes Mellitus II Mother     Past Medical History  Diagnosis Date  . Diabetes mellitus without complication   . Cancer   . Prostate cancer   . Fluid overload 09/03/2012    Post op  . Ileus following gastrointestinal surgery 09/03/2012  . Diabetic retinopathy associated with type 2 diabetes mellitus 09/13/2014    He works for [employer] for [employer]  . Visual impairment     Past Surgical History  Procedure Laterality Date  . Hernia repair    . Prostate surgery    . Shoulder surgery    . Laparotomy  08/23/2012    Procedure: EXPLORATORY LAPAROTOMY;  Surgeon: [name], DO;  Location: [facility];  Service: General;  Laterality: N/A;  exploratory laparotomy with lysis of adhesions  . Lysis of adhesion  08/23/2012    Procedure: LYSIS OF ADHESION;  Surgeon: [name], DO;  Location: [facility];  Service: General;;  . Abdominal surgery      Social History:  reports that he quit  smoking about 48 years ago. His smoking use included Cigarettes. He has never used smokeless tobacco. He reports that he drinks alcohol. His drug history is not on file.  Allergies:  Allergies  Allergen Reactions  . Naproxen Anaphylaxis    REACTION: Throat swells and cannot breathe     (Not in a hospital admission)  Blood pressure 147/75, pulse 81, temperature 98.2 F (36.8 C), temperature source Oral, resp. rate 18, height 5' 6" (1.676 m), weight 77.474 kg (170 lb 12.8 oz), SpO2 100 %. Physical Exam: General: pleasant, WD, WN black male who is laying in bed in NAD HEENT: head is normocephalic, atraumatic.  Sclera are noninjected.  PERRL.  Ears and nose without any masses or lesions.  Mouth is pink and moist Heart: regular, rate, and rhythm.  Normal s1,s2. No obvious murmurs, gallops, or rubs noted.  Palpable radial and pedal pulses bilaterally Lungs: CTAB, no wheezes, rhonchi, or rales noted.  Respiratory effort nonlabored Abd: soft, mild diffuse tenderness, minimal distention, +BS, no masses, hernias, or organomegaly, midline scar present MS: all 4 extremities are symmetrical with no cyanosis, clubbing, or edema. Skin: warm and dry with no masses, lesions, or rashes Psych: A&Ox3 with an appropriate affect.    Results for orders placed or performed during the hospital encounter of 04/08/15 (from the past 48 hour(s))  Lipase, blood  Status: Abnormal   Collection Time: 04/08/15  1:20 AM  Result Value Ref Range   Lipase 13 (L) 22 - 51 U/L  Comprehensive metabolic panel     Status: Abnormal   Collection Time: 04/08/15  1:20 AM  Result Value Ref Range   Sodium 130 (L) 135 - 145 mmol/L   Potassium 5.2 (H) 3.5 - 5.1 mmol/L   Chloride 92 (L) 101 - 111 mmol/L   CO2 26 22 - 32 mmol/L   Glucose, Bld 432 (H) 65 - 99 mg/dL   BUN 18 6 - 20 mg/dL   Creatinine, Ser 1.39 (H) 0.61 - 1.24 mg/dL   Calcium 9.9 8.9 - 10.3 mg/dL   Total Protein 7.7 6.5 - 8.1 g/dL   Albumin 4.5 3.5 - 5.0 g/dL    AST 29 15 - 41 U/L   ALT 27 17 - 63 U/L   Alkaline Phosphatase 155 (H) 38 - 126 U/L   Total Bilirubin 1.5 (H) 0.3 - 1.2 mg/dL   GFR calc non Af Amer 52 (L) >60 mL/min   GFR calc Af Amer 60 (L) >60 mL/min    Comment: (NOTE) The eGFR has been calculated using the CKD EPI equation. This calculation has not been validated in all clinical situations. eGFR's persistently <60 mL/min signify possible Chronic Kidney Disease.    Anion gap 12 5 - 15  CBC     Status: Abnormal   Collection Time: 04/08/15  1:20 AM  Result Value Ref Range   WBC 9.1 4.0 - 10.5 K/uL   RBC 6.91 (H) 4.22 - 5.81 MIL/uL   Hemoglobin 14.7 13.0 - 17.0 g/dL   HCT 44.9 39.0 - 52.0 %   MCV 65.0 (L) 78.0 - 100.0 fL   MCH 21.3 (L) 26.0 - 34.0 pg   MCHC 32.7 30.0 - 36.0 g/dL   RDW 14.9 11.5 - 15.5 %   Platelets 140 (L) 150 - 400 K/uL  I-Stat CG4 Lactic Acid, ED     Status: None   Collection Time: 04/08/15  4:03 AM  Result Value Ref Range   Lactic Acid, Venous 1.60 0.5 - 2.0 mmol/L  Urinalysis, Routine w reflex microscopic (not at [facility])     Status: Abnormal   Collection Time: 04/08/15  4:56 AM  Result Value Ref Range   Color, Urine YELLOW YELLOW   APPearance CLOUDY (A) CLEAR   Specific Gravity, Urine 1.034 (H) 1.005 - 1.030   pH 7.5 5.0 - 8.0   Glucose, UA >1000 (A) NEGATIVE mg/dL   Hgb urine dipstick NEGATIVE NEGATIVE   Bilirubin Urine NEGATIVE NEGATIVE   Ketones, ur 40 (A) NEGATIVE mg/dL   Protein, ur 30 (A) NEGATIVE mg/dL   Urobilinogen, UA 0.2 0.0 - 1.0 mg/dL   Nitrite NEGATIVE NEGATIVE   Leukocytes, UA NEGATIVE NEGATIVE  Urine microscopic-add on     Status: None   Collection Time: 04/08/15  4:56 AM  Result Value Ref Range   Squamous Epithelial / LPF RARE RARE   RBC / HPF 0-2 <3 RBC/hpf   Bacteria, UA RARE RARE  I-Stat CG4 Lactic Acid, ED     Status: Abnormal   Collection Time: 04/08/15  6:57 AM  Result Value Ref Range   Lactic Acid, Venous 2.63 (HH) 0.5 - 2.0 mmol/L   Comment NOTIFIED PHYSICIAN     Ct Abdomen Pelvis W Contrast  04/08/2015   CLINICAL DATA:  Abdominal pain with nausea, vomiting and diarrhea started tonight. History of bowel obstruction.  EXAM: CT  ABDOMEN AND PELVIS WITH CONTRAST  TECHNIQUE: Multidetector CT imaging of the abdomen and pelvis was performed using the standard protocol following bolus administration of intravenous contrast.  CONTRAST:  173m OMNIPAQUE IOHEXOL 300 MG/ML  SOLN  COMPARISON:  CT 09/13/2014  FINDINGS: Lower chest: The included lung bases are clear. Prominent subcarinal lymph node appears unchanged from prior.  Liver: Normal in size without focal lesion.  Hepatobiliary: Gallbladder physiologically distended. No pericholecystic inflammatory change or calcified gallstone. No biliary dilatation.  Pancreas: Normal.  Spleen: Normal.  Adrenal glands: No nodule.  Mild thickening on the left.  Kidneys: Symmetric renal enhancement. No hydronephrosis. 10 mm cyst in the upper right kidney.  Stomach/Bowel: Stomach physiologically distended with fluid. Small hiatal hernia. Fluid-filled prominent proximal small bowel loops, with jejunal bowel loops measuring up to 3.2 cm in the left abdomen. Mild feculization of small bowel contents. Distal small bowel loops are decompressed, transition point is seen in the mid lower abdomen, image 47/88. Small volume of stool throughout the colon without colonic wall thickening. The appendix is normal.  Vascular/Lymphatic: No retroperitoneal adenopathy. Abdominal aorta is normal in caliber. Mild atherosclerosis of the abdominal aorta and its branches.  Reproductive: Surgical clips in the region of the prostate bed.  Bladder: Physiologically distended.  Other: No free air, free fluid, or intra-abdominal fluid collection. No mesenteric edema.  Musculoskeletal: There are no acute or suspicious osseous abnormalities. Degenerative change in the lumbar spine. Degenerative change versus avascular necrosis involving the right femoral head.  IMPRESSION:  Similar small bowel distention proximally with distal decompression, transition point in the lower mid abdomen. This may reflect very early small bowel obstruction. Overall appearance is similar to that of prior exam.   Electronically Signed   By: MJeb LeveringM.D.   On: 04/08/2015 07:03       Assessment/Plan 1. PSBO -the CT scan appears similar to his scan in January.  It is likely he has an area of narrowing from scar tissue from his previous surgeries and the high amount of fiber he ate last night at supper has caused a partial obstruction.  Hopefully this will resolve on it's own without an operation as it did in the past.  He is currently not throwing up and his stomach appears relatively decompressed on his CT scan.  Will hold off on an NGT currently, but if he begins to have more emesis, he will need one placed.  He was not given contrast to drink with his CT scan, so will give the 90cc of gastrografin from the bowel obstruction protocol and check a film in 8 hours to see if his contrast has passed through to his colon.  He should otherwise be NPO x for a few ice chips.  We will follow along.  Virgene Tirone E 04/08/2015, 10:16 AM Pager: [phone]

## 2015-04-08 NOTE — ED Notes (Signed)
Pt reports abdominal pain with nvd that started tonight. Pt with hx of bowel obstruction and sts this feels the same.

## 2015-04-08 NOTE — ED Provider Notes (Signed)
CSN: [id number]     Arrival date & time 04/08/15  0102 History  This chart was scribed for Merryl Hacker, MD by Meriel Pica, ED Scribe. This patient was seen in room B14C/B14C and the patient's care was started 3:59 AM.   Chief Complaint  Patient presents with  . Abdominal Pain   The history is provided by the patient. No language interpreter was used.   HPI Comments: [name] is a 64 y.o. male, with a PMhx of SBO, who presents to the Emergency Department complaining of sudden onset, constant, 10/10, sharp abdominal pain that began after dinner Tuesday evening.The pt reports associated nausea, vomiting and diarrhea. He notes a history of 2 small bowel obstruction and reports his current symptoms feel similar to his experience with SBO in the past for which he has had surgery for. Last normal BM was before he experienced the abdominal pain Tuesday evening. The pt has given himself an enema with no relief. Denies abdominal distention or fevers.  Patient admitted with SBO in January 2016. He was admitted to the medicine service for medical management with surgery following.  Past Medical History  Diagnosis Date  . Diabetes mellitus without complication   . Cancer   . Prostate cancer   . Fluid overload 09/03/2012    Post op  . Ileus following gastrointestinal surgery 09/03/2012  . Diabetic retinopathy associated with type 2 diabetes mellitus 09/13/2014    He works for [employer] for [employer]  . Visual impairment    Past Surgical History  Procedure Laterality Date  . Hernia repair    . Prostate surgery    . Shoulder surgery    . Laparotomy  08/23/2012    Procedure: EXPLORATORY LAPAROTOMY;  Surgeon: [name], DO;  Location: [facility];  Service: General;  Laterality: N/A;  exploratory laparotomy with lysis of adhesions  . Lysis of adhesion  08/23/2012    Procedure: LYSIS OF ADHESION;  Surgeon: [name], DO;  Location: [facility];  Service: General;;  . Abdominal surgery      Family History  Problem Relation Age of Onset  . Diabetes Mellitus II Mother    Social History  Substance Use Topics  . Smoking status: Former Smoker    Types: Cigarettes    Quit date: 11/09/1966  . Smokeless tobacco: Never Used  . Alcohol Use: Yes     Comment: SPARINGLY    Review of Systems  Constitutional: Negative.  Negative for fever.  Respiratory: Negative.  Negative for chest tightness and shortness of breath.   Cardiovascular: Negative.  Negative for chest pain.  Gastrointestinal: Positive for nausea, vomiting, abdominal pain and diarrhea. Negative for abdominal distention.  Genitourinary: Negative.  Negative for dysuria.  All other systems reviewed and are negative.   Allergies  Naproxen  Home Medications   Prior to Admission medications   Medication Sig Start Date End Date Taking? Authorizing Provider  albuterol (PROVENTIL HFA;VENTOLIN HFA) 108 (90 BASE) MCG/ACT inhaler Inhale 1-2 puffs into the lungs every 6 (six) hours as needed for wheezing or shortness of breath.   Yes Historical Provider, MD  aspirin 81 MG chewable tablet Chew 81 mg by mouth daily.   Yes Historical Provider, MD  atorvastatin (LIPITOR) 10 MG tablet Take 10 mg by mouth daily.   Yes Historical Provider, MD  cloNIDine (CATAPRES - DOSED IN MG/24 HR) 0.2 mg/24hr patch Place 0.2 mg onto the skin once a week.   Yes Historical Provider, MD  famotidine (PEPCID) 20  MG tablet One at bedtime 12/11/14  Yes Tanda Rockers, MD  gabapentin (NEURONTIN) 300 MG capsule Take 1 capsule (300 mg total) by mouth 2 (two) times daily. 09/18/14  Yes Delfina Redwood, MD  glipiZIDE (GLUCOTROL) 10 MG tablet Take 1 tablet (10 mg total) by mouth 2 (two) times daily before a meal. 09/18/14  Yes Delfina Redwood, MD  HYDROcodone-acetaminophen (NORCO/VICODIN) 5-325 MG per tablet Take 1-2 tablets by mouth every 6 (six) hours as needed. Patient taking differently: Take 1-2 tablets by mouth every 6 (six) hours as needed for moderate  pain.  12/15/14  Yes Montine Circle, PA-C  insulin glargine (LANTUS) 100 UNIT/ML injection Inject 5 Units into the skin at bedtime. 09/04/12  Yes Earnstine Regal, PA-C  Lancets (FREESTYLE) lancets Use as instructed 09/18/14  Yes Delfina Redwood, MD  losartan (COZAAR) 25 MG tablet Take 25 mg by mouth daily.   Yes Historical Provider, MD  metFORMIN (GLUCOPHAGE) 1000 MG tablet Take 1 tablet (1,000 mg total) by mouth 2 (two) times daily with a meal. 09/18/14  Yes Delfina Redwood, MD  Multiple Vitamin (MULTIVITAMIN WITH MINERALS) TABS tablet Take 1 tablet by mouth daily.   Yes Historical Provider, MD  pantoprazole (PROTONIX) 40 MG tablet Take 1 tablet (40 mg total) by mouth daily. Take 30-60 min before first meal of the day 12/11/14  Yes Tanda Rockers, MD  Polyethyl Glycol-Propyl Glycol (SYSTANE OP) Place 1 drop into both eyes daily as needed (dry eyes).   Yes Historical Provider, MD   BP 170/68 mmHg  Pulse 79  Temp(Src) 98.1 F (36.7 C) (Oral)  Resp 18  Ht 5\' 6"  (1.676 m)  Wt 170 lb 12.8 oz (77.474 kg)  BMI 27.58 kg/m2  SpO2 99% Physical Exam  Constitutional: He is oriented to person, place, and time. He appears well-developed and well-nourished. No distress.  HENT:  Head: Normocephalic and atraumatic.  Eyes: Pupils are equal, round, and reactive to light.  Cardiovascular: Normal rate, regular rhythm and normal heart sounds.   No murmur heard. Pulmonary/Chest: Effort normal and breath sounds normal. No respiratory distress. He has no wheezes.  Abdominal: Soft. Bowel sounds are normal. He exhibits distension. There is tenderness. There is guarding. There is no rebound.  Diffuse tenderness to palpation with voluntary guarding  Musculoskeletal: He exhibits no edema.  Neurological: He is alert and oriented to person, place, and time.  Skin: Skin is warm and dry.  Psychiatric: He has a normal mood and affect.  Nursing note and vitals reviewed.   ED Course  Procedures   Angiocath  insertion Performed by: Merryl Hacker  Consent: Verbal consent obtained. Risks and benefits: risks, benefits and alternatives were discussed Time out: Immediately prior to procedure a "time out" was called to verify the correct patient, procedure, equipment, support staff and site/side marked as required.  Preparation: Patient was prepped and draped in the usual sterile fashion.  Vein Location: right basilic   Ultrasound Guided  Gauge: 20  Normal blood return and flush without difficulty Patient tolerance: Patient tolerated the procedure well with no immediate complications.    DIAGNOSTIC STUDIES: Oxygen Saturation is 99% on RA, normal by my interpretation.    COORDINATION OF CARE: 4:02 AM Discussed treatment plan which includes to order CT abdomen with pt. Discussed unremarkable labs with pt. Pt acknowledges and agrees to plan.   Labs Review Labs Reviewed  LIPASE, BLOOD - Abnormal; Notable for the following:    Lipase 13 (*)  All other components within normal limits  COMPREHENSIVE METABOLIC PANEL - Abnormal; Notable for the following:    Sodium 130 (*)    Potassium 5.2 (*)    Chloride 92 (*)    Glucose, Bld 432 (*)    Creatinine, Ser 1.39 (*)    Alkaline Phosphatase 155 (*)    Total Bilirubin 1.5 (*)    GFR calc non Af Amer 52 (*)    GFR calc Af Amer 60 (*)    All other components within normal limits  CBC - Abnormal; Notable for the following:    RBC 6.91 (*)    MCV 65.0 (*)    MCH 21.3 (*)    Platelets 140 (*)    All other components within normal limits  URINALYSIS, ROUTINE W REFLEX MICROSCOPIC (NOT AT [facility]) - Abnormal; Notable for the following:    APPearance CLOUDY (*)    Specific Gravity, Urine 1.034 (*)    Glucose, UA >1000 (*)    Ketones, ur 40 (*)    Protein, ur 30 (*)    All other components within normal limits  URINE MICROSCOPIC-ADD ON  I-STAT CG4 LACTIC ACID, ED  I-STAT CG4 LACTIC ACID, ED    Imaging Review Ct Abdomen Pelvis W  Contrast  04/08/2015   CLINICAL DATA:  Abdominal pain with nausea, vomiting and diarrhea started tonight. History of bowel obstruction.  EXAM: CT ABDOMEN AND PELVIS WITH CONTRAST  TECHNIQUE: Multidetector CT imaging of the abdomen and pelvis was performed using the standard protocol following bolus administration of intravenous contrast.  CONTRAST:  151mL OMNIPAQUE IOHEXOL 300 MG/ML  SOLN  COMPARISON:  CT 09/13/2014  FINDINGS: Lower chest: The included lung bases are clear. Prominent subcarinal lymph node appears unchanged from prior.  Liver: Normal in size without focal lesion.  Hepatobiliary: Gallbladder physiologically distended. No pericholecystic inflammatory change or calcified gallstone. No biliary dilatation.  Pancreas: Normal.  Spleen: Normal.  Adrenal glands: No nodule.  Mild thickening on the left.  Kidneys: Symmetric renal enhancement. No hydronephrosis. 10 mm cyst in the upper right kidney.  Stomach/Bowel: Stomach physiologically distended with fluid. Small hiatal hernia. Fluid-filled prominent proximal small bowel loops, with jejunal bowel loops measuring up to 3.2 cm in the left abdomen. Mild feculization of small bowel contents. Distal small bowel loops are decompressed, transition point is seen in the mid lower abdomen, image 47/88. Small volume of stool throughout the colon without colonic wall thickening. The appendix is normal.  Vascular/Lymphatic: No retroperitoneal adenopathy. Abdominal aorta is normal in caliber. Mild atherosclerosis of the abdominal aorta and its branches.  Reproductive: Surgical clips in the region of the prostate bed.  Bladder: Physiologically distended.  Other: No free air, free fluid, or intra-abdominal fluid collection. No mesenteric edema.  Musculoskeletal: There are no acute or suspicious osseous abnormalities. Degenerative change in the lumbar spine. Degenerative change versus avascular necrosis involving the right femoral head.  IMPRESSION: Similar small bowel  distention proximally with distal decompression, transition point in the lower mid abdomen. This may reflect very early small bowel obstruction. Overall appearance is similar to that of prior exam.   Electronically Signed   By: Jeb Levering M.D.   On: 04/08/2015 07:03   Dg Abd Portable 1v-small Bowel Obstruction Protocol-initial, 8 Hr Delay  04/08/2015   CLINICAL DATA:  Evaluate for small bowel obstruction. 90 cc of Gastrografin was administered through the NG tube at 12:20 p.m. today.  EXAM: PORTABLE ABDOMEN - 1 VIEW  COMPARISON:  CT from 04/08/2015 at  6:23 a.m.  FINDINGS: The enteric contrast material has progressed through the colon and is at the level of the rectum. No dilated small bowel loops identified.  IMPRESSION: 1. Enteric contrast material has progressed through the colon up to the rectum. Along with absence of small bowel distension on the current film the findings argue against significant bowel obstruction.   Electronically Signed   By: Kerby Moors M.D.   On: 04/08/2015 20:30   I have personally reviewed and evaluated these images and lab results as part of my medical decision-making.   EKG Interpretation None      MDM   Final diagnoses:  SBO (small bowel obstruction)   Patient presents with symptoms which he states are consistent with prior small bowel obstructions.  Uncomfortable but nontoxic on exam. Tender to palpation without signs of peritonitis. Lab work obtained and largely reassuring. Patient given pain and nausea medication. CT scan shows possible early obstruction. Discussed with surgery, Dr. Marlou Starks who will evaluate the patient. Will admit patient to the teaching service for medical management.  I personally performed the services described in this documentation, which was scribed in my presence. The recorded information has been reviewed and is accurate.    Merryl Hacker, MD 04/09/15 [phone]

## 2015-04-09 ENCOUNTER — Encounter (HOSPITAL_COMMUNITY): Payer: Self-pay | Admitting: General Surgery

## 2015-04-09 DIAGNOSIS — E2740 Unspecified adrenocortical insufficiency: Secondary | ICD-10-CM | POA: Diagnosis present

## 2015-04-09 LAB — BASIC METABOLIC PANEL
Anion gap: 7 (ref 5–15)
BUN: 10 mg/dL (ref 6–20)
CHLORIDE: 107 mmol/L (ref 101–111)
CO2: 25 mmol/L (ref 22–32)
CREATININE: 1.13 mg/dL (ref 0.61–1.24)
Calcium: 8.5 mg/dL — ABNORMAL LOW (ref 8.9–10.3)
GFR calc Af Amer: >60 mL/min (ref >60)
GFR calc non Af Amer: >60 mL/min (ref >60)
GLUCOSE: 149 mg/dL — AB (ref 65–99)
Potassium: 3.8 mmol/L (ref 3.5–5.1)
SODIUM: 139 mmol/L (ref 135–145)

## 2015-04-09 LAB — CORTISOL-AM, BLOOD: CORTISOL - AM: 5.6 ug/dL — AB (ref 6.7–22.6)

## 2015-04-09 LAB — GLUCOSE, CAPILLARY
GLUCOSE-CAPILLARY: 174 mg/dL — AB (ref 65–99)
Glucose-Capillary: 133 mg/dL — ABNORMAL HIGH (ref 65–99)
Glucose-Capillary: 125 mg/dL — ABNORMAL HIGH (ref 65–99)

## 2015-04-09 MED ORDER — HYDROCORTISONE NA SUCCINATE PF 100 MG IJ SOLR
50.0000 mg | Freq: Four times a day (QID) | INTRAMUSCULAR | Status: DC
  Administered 2015-04-09 – 2015-04-10 (×4): 50 mg via INTRAVENOUS
  Filled 2015-04-09 (×4): qty 2

## 2015-04-09 NOTE — Progress Notes (Addendum)
Inpatient Diabetes Program Recommendations  AACE/ADA: New Consensus Statement on Inpatient Glycemic Control (2013)  Target Ranges:  Prepandial:   less than 140 mg/dL      Peak postprandial:   less than 180 mg/dL (1-2 hours)      Critically ill patients:  140 - 180 mg/dL   Consult from RN:  teaching finger sticks, insulin injection, diabetic manangement   Note: Patient is familiar to our team and was spoken to in detail about his DM management at home on 09/15/2014. Patient has meter and knows how to check glucose and has been prescribed lantus injections for months and has been told how to use it. Ordered Patient education videos to remind patient if needed. Patient seemed to be somewhat depressed at that visit. Unsure of how this depression is effecting patients DM management.   Thanks,  Tama Headings RN, MSN, [facility] Inpatient Diabetes Coordinator Team Pager [phone]

## 2015-04-09 NOTE — Evaluation (Signed)
Physical Therapy Evaluation Patient Details Name: [name] MRN: [id number] DOB: 1951/04/21 Today's Date: 04/09/2015   History of Present Illness  Patient is a 64 yo male admitted 04/08/15 with abdominal pain, N/V.  Patient with partial SBO.  Patient with recent hospitalization for SBO with laporotomy and lysis of adhesions.  PMH:  sarcoidosis, DM, HTN, diabetic retinopathy, peripheral neuropathy.  Clinical Impression  Patient is independent with all mobility and gait.  Good balance with high level balance activities.  No acute PT needs identified - PT will sign off.  Encouraged ambulation in hallway.    Follow Up Recommendations No PT follow up;Supervision for mobility/OOB    Equipment Recommendations  None recommended by PT    Recommendations for Other Services       Precautions / Restrictions Precautions Precautions: None Precaution Comments: Decreased vision Restrictions Weight Bearing Restrictions: No      Mobility  Bed Mobility Overal bed mobility: Independent                Transfers Overall transfer level: Independent Equipment used: None                Ambulation/Gait Ambulation/Gait assistance: Independent Ambulation Distance (Feet): 300 Feet Assistive device: None Gait Pattern/deviations: WFL(Within Functional Limits)   Gait velocity interpretation: at or above normal speed for age/gender General Gait Details: Patient with good gait pattern, balance, and speed.  Stairs            Wheelchair Mobility    Modified Rankin (Stroke Patients Only)       Balance Overall balance assessment: Independent                           High level balance activites: Direction changes;Turns;Sudden stops;Head turns High Level Balance Comments: No loss of balance with high level balance activities             Pertinent Vitals/Pain Pain Assessment: No/denies pain    Home Living Family/patient expects to be discharged to::  Private residence Living Arrangements: Spouse/significant other Available Help at Discharge: Family;Available 24 hours/day Type of Home: Apartment Home Access: Stairs to enter Entrance Stairs-Rails: Psychiatric [name] of Steps: Flight Home Layout: One level Home Equipment: None      Prior Function Level of Independence: Independent         Comments: Works for [employer] for the [employer]        Extremity/Trunk Assessment   Upper Extremity Assessment: Overall WFL for tasks assessed           Lower Extremity Assessment: Overall WFL for tasks assessed         Communication   Communication: No difficulties  Cognition Arousal/Alertness: Awake/alert Behavior During Therapy: WFL for tasks assessed/performed Overall Cognitive Status: Within Functional Limits for tasks assessed                      General Comments      Exercises        Assessment/Plan    PT Assessment Patent does not need any further PT services  PT Diagnosis Acute pain;Abnormality of gait   PT Problem List    PT Treatment Interventions     PT Goals (Current goals can be found in the Care Plan section) Acute Rehab PT Goals PT Goal Formulation: All assessment and education complete, DC therapy    Frequency     Barriers to discharge  Co-evaluation               End of Session   Activity Tolerance: Patient tolerated treatment well Patient left: in bed;with call bell/phone within reach;with nursing/sitter in room (sitting EOB for vital signs to be taken) [name] Communication: Mobility status (No acute PT needs identified)         Time: [id number] PT Time Calculation (min) (ACUTE ONLY): 17 min   Charges:   PT Evaluation $Initial PT Evaluation Tier I: 1 Procedure     PT G CodesDespina [name] May 02, 2015, 2:06 PM [name]. [name], [name] Pager [phone]

## 2015-04-09 NOTE — Progress Notes (Signed)
Patient ID: [name], male   DOB: April 28, 1951, 64 y.o.   MRN: [id number]    Subjective: Pt feels well today.  Had 5 BMs yesterday  Objective: Vital signs in last 24 hours: Temp:  [98 F (36.7 C)-98.4 F (36.9 C)] 98 F (36.7 C) (08/25 0553) Pulse Rate:  [66-72] 72 (08/25 1021) Resp:  [18-20] 18 (08/25 0553) BP: (134-150)/(50-74) 146/74 mmHg (08/25 1021) SpO2:  [98 %-100 %] 98 % (08/25 0553) Last BM Date: 04/09/15  Intake/Output from previous day: 08/24 0701 - 08/25 0700 In: 3124.2 [I.V.:3124.2] Out: 775 [Urine:775] Intake/Output this shift: Total I/O In: 1950 [I.V.:1950] Out: -   PE: Abd: soft, NT, ND, +BS  Lab Results:   Recent Labs  04/08/15 0120  WBC 9.1  HGB 14.7  HCT 44.9  PLT 140*   BMET  Recent Labs  04/08/15 1508 04/09/15 0635  NA 136 139  K 5.0 3.8  CL 102 107  CO2 26 25  GLUCOSE 217* 149*  BUN 15 10  CREATININE 1.23 1.13  CALCIUM 9.1 8.5*   PT/INR No results for input(s): LABPROT, INR in the last 72 hours. CMP     Component Value Date/Time   NA 139 04/09/2015 0635   K 3.8 04/09/2015 0635   CL 107 04/09/2015 0635   CO2 25 04/09/2015 0635   GLUCOSE 149* 04/09/2015 0635   BUN 10 04/09/2015 0635   CREATININE 1.13 04/09/2015 0635   CALCIUM 8.5* 04/09/2015 0635   PROT 7.7 04/08/2015 0120   ALBUMIN 4.5 04/08/2015 0120   AST 29 04/08/2015 0120   ALT 27 04/08/2015 0120   ALKPHOS 155* 04/08/2015 0120   BILITOT 1.5* 04/08/2015 0120   GFRNONAA >60 04/09/2015 0635   GFRAA >60 04/09/2015 0635   Lipase     Component Value Date/Time   LIPASE 13* 04/08/2015 0120       Studies/Results: Ct Abdomen Pelvis W Contrast  04/08/2015   CLINICAL DATA:  Abdominal pain with nausea, vomiting and diarrhea started tonight. History of bowel obstruction.  EXAM: CT ABDOMEN AND PELVIS WITH CONTRAST  TECHNIQUE: Multidetector CT imaging of the abdomen and pelvis was performed using the standard protocol following bolus administration of intravenous  contrast.  CONTRAST:  174mL OMNIPAQUE IOHEXOL 300 MG/ML  SOLN  COMPARISON:  CT 09/13/2014  FINDINGS: Lower chest: The included lung bases are clear. Prominent subcarinal lymph node appears unchanged from prior.  Liver: Normal in size without focal lesion.  Hepatobiliary: Gallbladder physiologically distended. No pericholecystic inflammatory change or calcified gallstone. No biliary dilatation.  Pancreas: Normal.  Spleen: Normal.  Adrenal glands: No nodule.  Mild thickening on the left.  Kidneys: Symmetric renal enhancement. No hydronephrosis. 10 mm cyst in the upper right kidney.  Stomach/Bowel: Stomach physiologically distended with fluid. Small hiatal hernia. Fluid-filled prominent proximal small bowel loops, with jejunal bowel loops measuring up to 3.2 cm in the left abdomen. Mild feculization of small bowel contents. Distal small bowel loops are decompressed, transition point is seen in the mid lower abdomen, image 47/88. Small volume of stool throughout the colon without colonic wall thickening. The appendix is normal.  Vascular/Lymphatic: No retroperitoneal adenopathy. Abdominal aorta is normal in caliber. Mild atherosclerosis of the abdominal aorta and its branches.  Reproductive: Surgical clips in the region of the prostate bed.  Bladder: Physiologically distended.  Other: No free air, free fluid, or intra-abdominal fluid collection. No mesenteric edema.  Musculoskeletal: There are no acute or suspicious osseous abnormalities. Degenerative change in the lumbar spine.  Degenerative change versus avascular necrosis involving the right femoral head.  IMPRESSION: Similar small bowel distention proximally with distal decompression, transition point in the lower mid abdomen. This may reflect very early small bowel obstruction. Overall appearance is similar to that of prior exam.   Electronically Signed   By: Jeb Levering M.D.   On: 04/08/2015 07:03   Dg Abd Portable 1v-small Bowel Obstruction  Protocol-initial, 8 Hr Delay  04/08/2015   CLINICAL DATA:  Evaluate for small bowel obstruction. 90 cc of Gastrografin was administered through the NG tube at 12:20 p.m. today.  EXAM: PORTABLE ABDOMEN - 1 VIEW  COMPARISON:  CT from 04/08/2015 at 6:23 a.m.  FINDINGS: The enteric contrast material has progressed through the colon and is at the level of the rectum. No dilated small bowel loops identified.  IMPRESSION: 1. Enteric contrast material has progressed through the colon up to the rectum. Along with absence of small bowel distension on the current film the findings argue against significant bowel obstruction.   Electronically Signed   By: Kerby Moors M.D.   On: 04/08/2015 20:30    Anti-infectives: Anti-infectives    None       Assessment/Plan  1. PSBO -likely secondary to high fiber food bezoar.  Oral contrast has made it through to his rectum with no evidence of bowel obstruction -agree with clear liquids and can advance diet as tolerates to soft diet -d/w patient concerns for big boluses of high fiber food at one time.   LOS: 1 day    Kailiana Granquist E 04/09/2015, 10:41 AM Pager: [phone]

## 2015-04-09 NOTE — Progress Notes (Addendum)
Internal Medicine Attending:   I saw and examined the patient. I reviewed the resident's note and I agree with the resident's findings and plan as documented in the resident's note.  64 year old man with a past spontaneous bowel obstruction admitted with acute abdominal pain, nausea, vomiting symptomatically moderately improved today with supportive care. Imaging studies do not suggest that he had a bowel obstruction this time, and exam is not consistent with ileus. Based on these symptoms and moderate dehydration along with hyponatremia and hyperkalemia present on admission, our team suspected possible adrenal insufficiency. A.m. cortisol today is much lower than I would expect for patient admitted with an acute illness making acute adrenal insufficiency very likely. I don't think we need to do a stimulation test, rather we will start treatment with hydrocortisone 50 mg every 6 hours. Monitor the patient tonight, appears euvolemic today. Potentially could discharge home tomorrow if he is taking appropriate oral intake.

## 2015-04-09 NOTE — Progress Notes (Signed)
   Subjective:  No acute events overnight. Pt had no abdominal pain, wanting to advance his diet. No n/v, had 5 episodes of loose diarrhea.  Pt feels better today  His AM cortisol result was markedly low, making the diagnosis of acute adrenal insufficiency.   Objective: Filed Vitals:   04/08/15 1537 04/08/15 2153 04/09/15 0553 04/09/15 1021  BP: 140/63 150/59 134/59 146/74  Pulse: 67 70 66 72  Temp: 98.4 F (36.9 C) 98.4 F (36.9 C) 98 F (36.7 C)   TempSrc: Oral Oral Oral   Resp:  20 18   Height:      Weight:      SpO2: 100% 100% 98%    Gen: A&O CV: rrr Resp: ctab Abd: + bowel sounds in all 4 Q, nondistended, nontender Extremities; has 10 cm  lipoma vs cyst on back  Labs, imaging, micro and cultures are reviewed, and the pertinent ones are discussed either in Subjective or in the Assessment and Plan.  A/P 64 yo with history of partial SBO, prostate CA s/p prostatectomy, T2DM, HTN, who came in with abdominal pain and some distension, nausea and vomiting, and feeling tired.   Abdominal pain likely due to acute Adrenal Insufficiency: Abdominal pain markedly improved with IV hydration, being NPO. History of partial SBO raises concern for SBO again, but given that the patient is having normal BM, and the pain has resolved, the generalized symptoms are much more likely due to the adrenal insufficiency. Also had imaging in which contrast passed through the entire GI tract so makes psbo less likely.   -Checked AM cortisol which was 5. - IV hydrocortisone 50 mg q6 -Advance diet  to clear liquids -Surgery following- recommended low fiber diet. And small portions. Will inform the patient. -Potential d/c home tomorrow  FEN: clear liquids Dvt ppx: heparin [place]   LOS: 1 day   Burgess Estelle, MD 04/09/2015, 1:34 PM

## 2015-04-09 NOTE — Discharge Instructions (Signed)
High-Fiber Diet Fiber is found in fruits, vegetables, and grains. A high-fiber diet encourages the addition of more whole grains, legumes, fruits, and vegetables in your diet. The recommended amount of fiber for adult males is 38 g per day. For adult females, it is 25 g per day. Pregnant and lactating women should get 28 g of fiber per day. If you have a digestive or bowel problem, ask your caregiver for advice before adding high-fiber foods to your diet. Eat a variety of high-fiber foods instead of only a select few type of foods.  PURPOSE  To increase stool bulk.  To make bowel movements more regular to prevent constipation.  To lower cholesterol.  To prevent overeating. WHEN IS THIS DIET USED?  It may be used if you have constipation and hemorrhoids.  It may be used if you have uncomplicated diverticulosis (intestine condition) and irritable bowel syndrome.  It may be used if you need help with weight management.  It may be used if you want to add it to your diet as a protective measure against atherosclerosis, diabetes, and cancer. SOURCES OF FIBER  Whole-grain breads and cereals.  Fruits, such as apples, oranges, bananas, berries, prunes, and pears.  Vegetables, such as green peas, carrots, sweet potatoes, beets, broccoli, cabbage, spinach, and artichokes.  Legumes, such split peas, soy, lentils.  Almonds. FIBER CONTENT IN FOODS Starches and Grains / Dietary Fiber (g)  Cheerios, 1 cup / 3 g  Corn Flakes cereal, 1 cup / 0.7 g  Rice crispy treat cereal, 1 cup / 0.3 g  Instant oatmeal (cooked),  cup / 2 g  Frosted wheat cereal, 1 cup / 5.1 g  Brown, long-grain rice (cooked), 1 cup / 3.5 g  White, long-grain rice (cooked), 1 cup / 0.6 g  Enriched macaroni (cooked), 1 cup / 2.5 g Legumes / Dietary Fiber (g)  Baked beans (canned, plain, or vegetarian),  cup / 5.2 g  Kidney beans (canned),  cup / 6.8 g  Pinto beans (cooked),  cup / 5.5 g Breads and Crackers  / Dietary Fiber (g)  Plain or honey graham crackers, 2 squares / 0.7 g  Saltine crackers, 3 squares / 0.3 g  Plain, salted pretzels, 10 pieces / 1.8 g  Whole-wheat bread, 1 slice / 1.9 g  White bread, 1 slice / 0.7 g  Raisin bread, 1 slice / 1.2 g  Plain bagel, 3 oz / 2 g  Flour tortilla, 1 oz / 0.9 g  Corn tortilla, 1 small / 1.5 g  Hamburger or hotdog bun, 1 small / 0.9 g Fruits / Dietary Fiber (g)  Apple with skin, 1 medium / 4.4 g  Sweetened applesauce,  cup / 1.5 g  Banana,  medium / 1.5 g  Grapes, 10 grapes / 0.4 g  Orange, 1 small / 2.3 g  Raisin, 1.5 oz / 1.6 g  Melon, 1 cup / 1.4 g Vegetables / Dietary Fiber (g)  Green beans (canned),  cup / 1.3 g  Carrots (cooked),  cup / 2.3 g  Broccoli (cooked),  cup / 2.8 g  Peas (cooked),  cup / 4.4 g  Mashed potatoes,  cup / 1.6 g  Lettuce, 1 cup / 0.5 g  Corn (canned),  cup / 1.6 g  Tomato,  cup / 1.1 g Document Released: 08/01/2005 Document Revised: 01/31/2012 Document Reviewed: 11/03/2011 [employer] Patient Information 2015 [place], [place]. This information is not intended to replace advice given to you by your health care provider.  Make sure you discuss any questions you have with your health care provider.  Low-Fiber Diet Fiber is found in fruits, vegetables, and whole grains. A low-fiber diet restricts fibrous foods that are not digested in the small intestine. A diet containing about 10-15 grams of fiber per day is considered low fiber. Low-fiber diets may be used to:  Promote healing and rest the bowel during intestinal flare-ups.  Prevent blockage of a partially obstructed or narrowed gastrointestinal tract.  Reduce fecal weight and volume.  Slow the movement of feces. You may be on a low-fiber diet as a transitional diet following surgery, after an injury (trauma), or because of a short (acute) or lifelong (chronic) illness. Your health care provider will determine the length of time  you need to stay on this diet.  WHAT DO I NEED TO KNOW ABOUT A LOW-FIBER DIET? Always check the fiber content on the packaging's Nutrition Facts label, especially on foods from the grains list. Ask your dietitian if you have questions about specific foods that are related to your condition, especially if the food is not listed below. In general, a low-fiber food will have less than 2 g of fiber. WHAT FOODS CAN I EAT? Grains All breads and crackers made with white flour. Sweet rolls, doughnuts, waffles, pancakes, [place] toast, bagels. Pretzels, Melba toast, zwieback. Well-cooked cereals, such as cornmeal, farina, or cream cereals. Dry cereals that do not contain whole grains, fruit, or nuts, such as refined corn, wheat, rice, and oat cereals. Potatoes prepared any way without skins, plain pastas and noodles, refined white rice. Use white flour for baking and making sauces. Use allowed list of grains for casseroles, dumplings, and puddings.  Vegetables Strained tomato and vegetable juices. Fresh lettuce, cucumber, spinach. Well-cooked (no skin or pulp) or canned vegetables, such as asparagus, bean sprouts, beets, carrots, green beans, mushrooms, potatoes, pumpkin, spinach, yellow squash, tomato sauce/puree, turnips, yams, and zucchini. Keep servings limited to  cup.  Fruits All fruit juices except prune juice. Cooked or canned fruits without skin and seeds, such as applesauce, apricots, cherries, fruit cocktail, grapefruit, grapes, mandarin oranges, melons, peaches, pears, pineapple, and plums. Fresh fruits without skin, such as apricots, avocados, bananas, melons, pineapple, nectarines, and peaches. Keep servings limited to  cup or 1 piece.  Meat and Other Protein Sources Ground or well-cooked tender beef, ham, veal, lamb, pork, or poultry. Eggs, plain cheese. Fish, oysters, shrimp, lobster, and other seafood. Liver, organ meats. Smooth nut butters. Dairy All milk products and alternative dairy  substitutes, such as soy, rice, almond, and coconut, not containing added whole nuts, seeds, or added fruit. Beverages Decaf coffee, fruit, and vegetable juices or smoothies (small amounts, with no pulp or skins, and with fruits from allowed list), sports drinks, herbal tea. Condiments Ketchup, mustard, vinegar, cream sauce, cheese sauce, cocoa powder. Spices in moderation, such as allspice, basil, bay leaves, celery powder or leaves, cinnamon, cumin powder, curry powder, ginger, mace, marjoram, onion or garlic powder, oregano, paprika, parsley flakes, ground pepper, rosemary, sage, savory, tarragon, thyme, and turmeric. Sweets and Desserts Plain cakes and cookies, pie made with allowed fruit, pudding, custard, cream pie. Gelatin, fruit, ice, sherbet, frozen ice pops. Ice cream, ice milk without nuts. Plain hard candy, honey, jelly, molasses, syrup, sugar, chocolate syrup, gumdrops, marshmallows. Limit overall sugar intake.  Fats and Oil Margarine, butter, cream, mayonnaise, salad oils, plain salad dressings made from allowed foods. Choose healthy fats such as olive oil, canola oil, and omega-3 fatty acids (such as  found in salmon or tuna) when possible.  Other Bouillon, broth, or cream soups made from allowed foods. Any strained soup. Casseroles or mixed dishes made with allowed foods. The items listed above may not be a complete list of recommended foods or beverages. Contact your dietitian for more options.  WHAT FOODS ARE NOT RECOMMENDED? Grains All whole wheat and whole grain breads and crackers. Multigrains, rye, bran seeds, nuts, or coconut. Cereals containing whole grains, multigrains, bran, coconut, nuts, raisins. Cooked or dry oatmeal, steel-cut oats. Coarse wheat cereals, granola. Cereals advertised as high fiber. Potato skins. Whole grain pasta, wild or brown rice. Popcorn. Coconut flour. Bran, buckwheat, corn bread, multigrains, rye, wheat germ.  Vegetables Fresh, cooked or canned  vegetables, such as artichokes, asparagus, beet greens, broccoli, Brussels sprouts, cabbage, celery, cauliflower, corn, eggplant, kale, legumes or beans, okra, peas, and tomatoes. Avoid large servings of any vegetables, especially raw vegetables.  Fruits Fresh fruits, such as apples with or without skin, berries, cherries, figs, grapes, grapefruit, guavas, kiwis, mangoes, oranges, papayas, pears, persimmons, pineapple, and pomegranate. Prune juice and juices with pulp, stewed or dried prunes. Dried fruits, dates, raisins. Fruit seeds or skins. Avoid large servings of all fresh fruits. Meats and Other Protein Sources Tough, fibrous meats with gristle. Chunky nut butter. Cheese made with seeds, nuts, or other foods not recommended. Nuts, seeds, legumes (beans, including baked beans), dried peas, beans, lentils.  Dairy Yogurt or cheese that contains nuts, seeds, or added fruit.  Beverages Fruit juices with high pulp, prune juice. Caffeinated coffee and teas.  Condiments Coconut, maple syrup, pickles, olives. Sweets and Desserts Desserts, cookies, or candies that contain nuts or coconut, chunky peanut butter, dried fruits. Jams, preserves with seeds, marmalade. Large amounts of sugar and sweets. Any other dessert made with fruits from the not recommended list.  Other Soups made from vegetables that are not recommended or that contain other foods not recommended.  The items listed above may not be a complete list of foods and beverages to avoid. Contact your dietitian for more information. Document Released: 01/21/2002 Document Revised: 08/06/2013 Document Reviewed: 06/24/2013 [facility] Patient Information 2015 [place], [place]. This information is not intended to replace advice given to you by your health care provider. Make sure you discuss any questions you have with your health care provider.

## 2015-04-10 DIAGNOSIS — E271 Primary adrenocortical insufficiency: Secondary | ICD-10-CM

## 2015-04-10 LAB — BASIC METABOLIC PANEL
ANION GAP: 9 (ref 5–15)
BUN: 10 mg/dL (ref 6–20)
CALCIUM: 8.8 mg/dL — AB (ref 8.9–10.3)
CHLORIDE: 103 mmol/L (ref 101–111)
CO2: 23 mmol/L (ref 22–32)
Creatinine, Ser: 1.16 mg/dL (ref 0.61–1.24)
GFR calc non Af Amer: >60 mL/min (ref >60)
Glucose, Bld: 267 mg/dL — ABNORMAL HIGH (ref 65–99)
POTASSIUM: 4 mmol/L (ref 3.5–5.1)
Sodium: 135 mmol/L (ref 135–145)

## 2015-04-10 LAB — ACTH: C206 ACTH: 7 pg/mL — ABNORMAL LOW (ref 7.2–63.3)

## 2015-04-10 LAB — GLUCOSE, CAPILLARY
GLUCOSE-CAPILLARY: 236 mg/dL — AB (ref 65–99)
GLUCOSE-CAPILLARY: 242 mg/dL — AB (ref 65–99)
GLUCOSE-CAPILLARY: 367 mg/dL — AB (ref 65–99)

## 2015-04-10 LAB — LACTIC ACID, PLASMA: Lactic Acid, Venous: 1.1 mmol/L (ref 0.5–2.0)

## 2015-04-10 MED ORDER — INSULIN ASPART 100 UNIT/ML ~~LOC~~ SOLN
3.0000 [IU] | Freq: Three times a day (TID) | SUBCUTANEOUS | Status: DC
  Administered 2015-04-10: 3 [IU] via SUBCUTANEOUS

## 2015-04-10 MED ORDER — INSULIN GLARGINE 100 UNIT/ML ~~LOC~~ SOLN
10.0000 [IU] | Freq: Every day | SUBCUTANEOUS | Status: DC
  Filled 2015-04-10: qty 0.1

## 2015-04-10 MED ORDER — HYDROCORTISONE 10 MG PO TABS: 10.0000 mg | ORAL_TABLET | ORAL | Status: DC

## 2015-04-10 MED ORDER — INSULIN GLARGINE 100 UNIT/ML ~~LOC~~ SOLN: 10.0000 [IU] | Freq: Every day | SUBCUTANEOUS | Status: DC

## 2015-04-10 NOTE — Progress Notes (Signed)
Subjective: Feels much better this morning, has already walked multiple laps around the unit. Objective: Vital signs in last 24 hours: Filed Vitals:   04/09/15 1021 04/09/15 1355 04/09/15 2146 04/10/15 1024  BP: 146/74 144/62 129/60 115/46  Pulse: 72 69 65 71  Temp:  97.9 F (36.6 C) 97.7 F (36.5 C)   TempSrc:  Oral Oral   Resp:  20 20   Height:      Weight:      SpO2:  100% 98%    Weight change:   Intake/Output Summary (Last 24 hours) at 04/10/15 1105 Last data filed at 04/10/15 1000  Gross per 24 hour  Intake    360 ml  Output      0 ml  Net    360 ml  General: resting in bed Cardiac: RRR, no rubs, murmurs or gallops Pulm: clear to auscultation bilaterally, moving normal volumes of air Abd: soft, nontender, nondistended, BS present Ext: warm and well perfused, no pedal edema Neuro: alert and oriented X3 Lab Results: Basic Metabolic Panel:  Recent Labs Lab 04/09/15 0635 04/10/15 0608  NA 139 135  K 3.8 4.0  CL 107 103  CO2 25 23  GLUCOSE 149* 267*  BUN 10 10  CREATININE 1.13 1.16  CALCIUM 8.5* 8.8*   Liver Function Tests:  Recent Labs Lab 04/08/15 0120  AST 29  ALT 27  ALKPHOS 155*  BILITOT 1.5*  PROT 7.7  ALBUMIN 4.5    Recent Labs Lab 04/08/15 0120  LIPASE 13*   CBC:  Recent Labs Lab 04/08/15 0120  WBC 9.1  HGB 14.7  HCT 44.9  MCV 65.0*  PLT 140*   CBG:  Recent Labs Lab 04/08/15 2126 04/09/15 0819 04/09/15 1148 04/09/15 1733 04/09/15 2142 04/10/15 0811  GLUCAP 153* 133* 125* 174* 236* 242*   Urinalysis:  Recent Labs Lab 04/08/15 0456  COLORURINE YELLOW  LABSPEC 1.034*  PHURINE 7.5  GLUCOSEU >1000*  HGBUR NEGATIVE  BILIRUBINUR NEGATIVE  KETONESUR 40*  PROTEINUR 30*  UROBILINOGEN 0.2  NITRITE NEGATIVE  LEUKOCYTESUR NEGATIVE     Micro Results: No results found for this or any previous visit (from the past 240 hour(s)). Studies/Results: Dg Abd Portable 1v-small Bowel Obstruction Protocol-initial, 8 Hr  Delay  04/08/2015   CLINICAL DATA:  Evaluate for small bowel obstruction. 90 cc of Gastrografin was administered through the NG tube at 12:20 p.m. today.  EXAM: PORTABLE ABDOMEN - 1 VIEW  COMPARISON:  CT from 04/08/2015 at 6:23 a.m.  FINDINGS: The enteric contrast material has progressed through the colon and is at the level of the rectum. No dilated small bowel loops identified.  IMPRESSION: 1. Enteric contrast material has progressed through the colon up to the rectum. Along with absence of small bowel distension on the current film the findings argue against significant bowel obstruction.   Electronically Signed   By: Kerby Moors M.D.   On: 04/08/2015 20:30   Medications: I have reviewed the patient's current medications. Scheduled Meds: . aspirin  81 mg Oral Daily  . atorvastatin  10 mg Oral Daily  . cloNIDine  0.2 mg Transdermal Weekly  . enoxaparin (LOVENOX) injection  40 mg Subcutaneous Q24H  . gabapentin  300 mg Oral BID  . hydrocortisone sod succinate (SOLU-CORTEF) inj  50 mg Intravenous Q6H  . insulin aspart  0-9 Units Subcutaneous TID WC  . insulin aspart  3 Units Subcutaneous TID WC  . insulin glargine  10 Units Subcutaneous QHS  . losartan  25 mg Oral Daily  . pantoprazole  40 mg Oral Daily   Continuous Infusions:  PRN Meds:.albuterol, morphine injection Assessment/Plan: Principal Problem:   Adrenal insufficiency, primary - Doing much better on IV hydrocortisone, will transition him to PO Hydrocortisone 20mg  in AM and 10mg  in PM, will need to follow up with endocrinology.    Diabetes mellitus type 2 uncontrolled - Mildly hyperglycemic with addition of steroids.  Patient reports he has not been checking his sugars as an oupatient and requests DM educator consult, will place. - Care management consult for affording meds - Increase Lantus to 10 units daily    Essential hypertension - well controlled on home medications    Partial small bowel obstruction - ? If symptoms  were due to partial bowel obstruction or due to AI.  His bowels are moving well currently and surgery has signed off.  He is tolerating a full diet.  Stable for d/c home.  Dispo: D/C home  The patient does have a current PCP Berkley Harvey, NP) and does not need an [facility] hospital follow-up appointment after discharge.  The patient does not have transportation limitations that hinder transportation to clinic appointments.  .Services Needed at time of discharge: Y = Yes, Blank = No PT:   OT:   RN:   Equipment:   Other:     LOS: 2 days   Lucious Groves, DO 04/10/2015, 11:05 AM

## 2015-04-10 NOTE — Progress Notes (Addendum)
Inpatient Diabetes Program Recommendations  AACE/ADA: New Consensus Statement on Inpatient Glycemic Control (2013)  Target Ranges:  Prepandial:   less than 140 mg/dL      Peak postprandial:   less than 180 mg/dL (1-2 hours)      Critically ill patients:  140 - 180 mg/dL   Results for DUAIN, PATHAK (MRN [id number]) as of 04/10/2015 08:10  Ref. Range 04/09/2015 08:19 04/09/2015 11:48 04/09/2015 17:33 04/09/2015 21:42  Glucose-Capillary Latest Ref Range: 65-99 mg/dL 133 (H) 125 (H) 174 (H) 236 (H)   Reason for Admission: Partial small bowel obstruction  Diabetes history: DM 2 Outpatient Diabetes medications: Lantus 5 units, Glipizide 10 mg BID, Metformin 1,000 mg BID Current orders for Inpatient glycemic control: Lantus 5 units QHS, Novolog Sensitive scale TID  Patient receiving 50 mg solucortef Q6hrs started yesterday 8/25.  Inpatient Diabetes Program Recommendations Insulin - Basal: Fasting glucose in the 260's. Please consider increasing basal insulin to Lantus 8 units QHS. Correction (SSI): Please consider adding HS scale.   Thanks,  Tama Headings RN, MSN, [facility] Inpatient Diabetes Coordinator Team Pager [phone]

## 2015-04-10 NOTE — Care Management Note (Signed)
Case Management Note  Patient Details  Name: RIP SCHOENBAUER MRN: [id number] Date of Birth: 27-Dec-1950  Subjective/Objective:                 Patient from home with wife, independent, insured with PCP. No CM needs identified at this time.    Action/Plan:  DC to home today, self care.  Expected Discharge Date:                  Expected Discharge Plan:  Home/Self Care  In-House Referral:     Discharge planning Services  CM Consult  Post Acute Care Choice:    Choice offered to:     DME Arranged:    DME Agency:     HH Arranged:    [place] Agency:     Status of Service:  Completed, signed off  Medicare Important Message Given:    Date Medicare IM Given:    Medicare IM give by:    Date Additional Medicare IM Given:    Additional Medicare Important Message give by:     If discussed at [place] of Stay Meetings, dates discussed:    Additional Comments:  Carles Collet, RN 04/10/2015, 11:24 AM

## 2015-04-10 NOTE — Progress Notes (Signed)
Internal Medicine Attending:   I saw and examined the patient. I reviewed the resident's note and I agree with the resident's findings and plan as documented in the resident's note.  Patient appears very well today. Still having some appetite aversion, but advancing diet well and tolerating small amounts of solid foods. Vitals are all stable, abdomen is soft and nontender. Plan will be for discharge today with continued supportive care at home. Initiate home hydrocortisone 20 mg in the morning and 10 mg in the evening for new diagnosis acute adrenal insufficiency. He is also going to restart his diabetes regimen and work with our diabetes educators today before discharge.

## 2015-04-10 NOTE — Discharge Summary (Signed)
Name: [name] MRN: [id number] DOB: September 22, 1950 64 y.o. PCP: Berkley Harvey, NP  Date of Admission: 04/08/2015  3:25 AM Date of Discharge: 04/10/2015 Attending Physician: Axel Filler, MD  Discharge Diagnosis: Principal Problem:   Adrenal insufficiency Active Problems:   Sarcoidosis   Diabetes mellitus   Essential hypertension   Partial small bowel obstruction  Discharge Medications:   Medication List    TAKE these medications        albuterol 108 (90 BASE) MCG/ACT inhaler  Commonly known as:  PROVENTIL HFA;VENTOLIN HFA  Inhale 1-2 puffs into the lungs every 6 (six) hours as needed for wheezing or shortness of breath.     aspirin 81 MG chewable tablet  Chew 81 mg by mouth daily.     atorvastatin 10 MG tablet  Commonly known as:  LIPITOR  Take 10 mg by mouth daily.     cloNIDine 0.2 mg/24hr patch  Commonly known as:  CATAPRES - Dosed in mg/24 hr  Place 0.2 mg onto the skin once a week.     famotidine 20 MG tablet  Commonly known as:  PEPCID  One at bedtime     freestyle lancets  Use as instructed     gabapentin 300 MG capsule  Commonly known as:  NEURONTIN  Take 1 capsule (300 mg total) by mouth 2 (two) times daily.     glipiZIDE 10 MG tablet  Commonly known as:  GLUCOTROL  Take 1 tablet (10 mg total) by mouth 2 (two) times daily before a meal.     HYDROcodone-acetaminophen 5-325 MG per tablet  Commonly known as:  NORCO/VICODIN  Take 1-2 tablets by mouth every 6 (six) hours as needed.     hydrocortisone 10 MG tablet  Commonly known as:  CORTEF  Take 1 tablet (10 mg total) by mouth as directed.     insulin glargine 100 UNIT/ML injection  Commonly known as:  LANTUS  Inject 0.1 mLs (10 Units total) into the skin at bedtime.     losartan 25 MG tablet  Commonly known as:  COZAAR  Take 25 mg by mouth daily.     metFORMIN 1000 MG tablet  Commonly known as:  GLUCOPHAGE  Take 1 tablet (1,000 mg total) by mouth 2 (two) times daily with a  meal.     multivitamin with minerals Tabs tablet  Take 1 tablet by mouth daily.     pantoprazole 40 MG tablet  Commonly known as:  PROTONIX  Take 1 tablet (40 mg total) by mouth daily. Take 30-60 min before first meal of the day     SYSTANE OP  Place 1 drop into both eyes daily as needed (dry eyes).        Disposition and follow-up:   AllenJudith L [name] was discharged from [facility] in Stable condition.  At the hospital follow up visit please address:  1.  Diagnosed with Secondary versus Tertiary Adrenal insufficiency: will need refills of Hydrocortisone, follow up of ACTH and referral of endocrinology.  2. DM control: increased lantus to 10units, suspect some noncompliance, and had DM educator see while inpatient.  2.  Labs / imaging needed at time of follow-up: [facility] stim test  3.  Pending labs/ test needing follow-up: none  Follow-up Appointments:   Discharge Instructions:     Discharge Instructions    Call MD for:  extreme fatigue    Complete by:  As directed      Diet - low  sodium heart healthy    Complete by:  As directed      Increase activity slowly    Complete by:  As directed            Consultations:    Procedures Performed:  Ct Abdomen Pelvis W Contrast  04/08/2015   CLINICAL DATA:  Abdominal pain with nausea, vomiting and diarrhea started tonight. History of bowel obstruction.  EXAM: CT ABDOMEN AND PELVIS WITH CONTRAST  TECHNIQUE: Multidetector CT imaging of the abdomen and pelvis was performed using the standard protocol following bolus administration of intravenous contrast.  CONTRAST:  168mL OMNIPAQUE IOHEXOL 300 MG/ML  SOLN  COMPARISON:  CT 09/13/2014  FINDINGS: Lower chest: The included lung bases are clear. Prominent subcarinal lymph node appears unchanged from prior.  Liver: Normal in size without focal lesion.  Hepatobiliary: Gallbladder physiologically distended. No pericholecystic inflammatory change or calcified gallstone. No  biliary dilatation.  Pancreas: Normal.  Spleen: Normal.  Adrenal glands: No nodule.  Mild thickening on the left.  Kidneys: Symmetric renal enhancement. No hydronephrosis. 10 mm cyst in the upper right kidney.  Stomach/Bowel: Stomach physiologically distended with fluid. Small hiatal hernia. Fluid-filled prominent proximal small bowel loops, with jejunal bowel loops measuring up to 3.2 cm in the left abdomen. Mild feculization of small bowel contents. Distal small bowel loops are decompressed, transition point is seen in the mid lower abdomen, image 47/88. Small volume of stool throughout the colon without colonic wall thickening. The appendix is normal.  Vascular/Lymphatic: No retroperitoneal adenopathy. Abdominal aorta is normal in caliber. Mild atherosclerosis of the abdominal aorta and its branches.  Reproductive: Surgical clips in the region of the prostate bed.  Bladder: Physiologically distended.  Other: No free air, free fluid, or intra-abdominal fluid collection. No mesenteric edema.  Musculoskeletal: There are no acute or suspicious osseous abnormalities. Degenerative change in the lumbar spine. Degenerative change versus avascular necrosis involving the right femoral head.  IMPRESSION: Similar small bowel distention proximally with distal decompression, transition point in the lower mid abdomen. This may reflect very early small bowel obstruction. Overall appearance is similar to that of prior exam.   Electronically Signed   By: Jeb Levering M.D.   On: 04/08/2015 07:03   Dg Abd Portable 1v-small Bowel Obstruction Protocol-initial, 8 Hr Delay  04/08/2015   CLINICAL DATA:  Evaluate for small bowel obstruction. 90 cc of Gastrografin was administered through the NG tube at 12:20 p.m. today.  EXAM: PORTABLE ABDOMEN - 1 VIEW  COMPARISON:  CT from 04/08/2015 at 6:23 a.m.  FINDINGS: The enteric contrast material has progressed through the colon and is at the level of the rectum. No dilated small bowel  loops identified.  IMPRESSION: 1. Enteric contrast material has progressed through the colon up to the rectum. Along with absence of small bowel distension on the current film the findings argue against significant bowel obstruction.   Electronically Signed   By: Kerby Moors M.D.   On: 04/08/2015 20:30    Admission HPI: 31 Y O M with PMH of Partial Small bowel obstruction, Prostate Cancer s/p prostatectomy, Sarcoidosis, DM2, HLD, HTN, presented with c/o Abdominal pain that started at about 10pm after he had dinner prepared by his wife. He says the pain was generalized and constant. He then had a good bowel movement. He has had similair pains in the past when he had small bowel obstruction. He gave himself an enema with good results. But abdominal pain persisted, he and the he had about 5-6  episodes of vomiting early this morning , which was non bloody and consisted of recently ingested food. Abdominal pian was relived by Morphine in the ED. Pt also endorses associated abdominal distension, he says it was noticed more by his wife, this improved after the bowel movement, but now he feels his abdomen is back to baseline. He denies fever, has 2 episodes of dizziness last night.  It appears per chart patient has had previous episodes of something similar. Previous abdominal surgeries prior to first episode of similar presentation- Prostate and ventral hernia surgery. Admission In 2011- CT scan noted no evidence of obstruction. Presentation in 2014- Ct scan showed evidence of bowel obstruction, for which he had exploratory lap With lysis of adhesions in 2014.   Hospital Course by problem list: Principal Problem:   Adrenal insufficiency, primary Active Problems:   Sarcoidosis   Diabetes mellitus   Essential hypertension   Partial small bowel obstruction   Hospital Course by Problem. 64 year old male, with a PMhx of small bowel obstruction and abdominal surgeries; exploratory laparotomy for bowel  obstruction with adhesion lysis in 2014 and ventral hernia repair. He was admitted with severe abdominal pain, distension, nausea and vomiting.  Possible Partial Small Bowel Obstruction: CT of the abdomen and pelvis with contrast showed small bowel distention proximally with distal decompression; suggestive of early small bowel obstruction without a clear transition point. Patient was made NPO, rehydrated with intravenous 0.9% Normal saline and his pain was controlled with morphine. Subsequent evaluation with Gastrografin study argued against bowel obstruction as the enteric contrast material progressed through the colon up to the rectum. The patient was able to have two more bowel movements the night of his admission. His abdominal distension and vomiting had resolved. It might be that his complaints of abdominal pain, nausea and vomiting were due to the adrenal insufficiency that was found.  Secondary versus Tertiary Adrenal insufficiency: On admission patient had hyperkalemia, hyponatremia and a constellation abdominal pain, nausea, vomiting with low energy levels was suspicious for adrenal insufficiency. A morning cortisol level was checked and found to be 5.6 consistent with this diagnosis. He was started on hydrocortisone IV and reported improvement in his malaise and energy levels. He was transitioned to oral hydrocortisone 20 mg AM and 10 mg PM, an ACTH level returned low indicating secondary to tertiary AI.  He will need a [facility] stimulation test to differentiate between the two. Follow up with an endocrinologist was also recommended.  Diabetes Mellitus: He was managed with home 5 units of Lantus with a sensitive sliding scale. His home metformin and glipizide was held. Given he was started on oral steroids for adrenal insufficiency he was instructed to increase his insulin to 10 units and meet with a diabetic educator.  Discharge Vitals:   BP 115/46 mmHg  Pulse 71  Temp(Src) 97.7 F (36.5 C)  (Oral)  Resp 20  Ht 5\' 6"  (1.676 m)  Wt 170 lb 12.8 oz (77.474 kg)  BMI 27.58 kg/m2  SpO2 98%  Discharge Labs:  Results for orders placed or performed during the hospital encounter of 04/08/15 (from the past 24 hour(s))  Glucose, capillary     Status: Abnormal   Collection Time: 04/09/15 11:48 AM  Result Value Ref Range   Glucose-Capillary 125 (H) 65 - 99 mg/dL  Glucose, capillary     Status: Abnormal   Collection Time: 04/09/15  5:33 PM  Result Value Ref Range   Glucose-Capillary 174 (H) 65 - 99 mg/dL  Glucose,  capillary     Status: Abnormal   Collection Time: 04/09/15  9:42 PM  Result Value Ref Range   Glucose-Capillary 236 (H) 65 - 99 mg/dL   Comment 1 Notify RN    Comment 2 Document in Chart   Lactic acid, plasma     Status: None   Collection Time: 04/10/15  6:08 AM  Result Value Ref Range   Lactic Acid, Venous 1.1 0.5 - 2.0 mmol/L  Basic metabolic panel     Status: Abnormal   Collection Time: 04/10/15  6:08 AM  Result Value Ref Range   Sodium 135 135 - 145 mmol/L   Potassium 4.0 3.5 - 5.1 mmol/L   Chloride 103 101 - 111 mmol/L   CO2 23 22 - 32 mmol/L   Glucose, Bld 267 (H) 65 - 99 mg/dL   BUN 10 6 - 20 mg/dL   Creatinine, Ser 1.16 0.61 - 1.24 mg/dL   Calcium 8.8 (L) 8.9 - 10.3 mg/dL   GFR calc non Af Amer >60 >60 mL/min   GFR calc Af Amer >60 >60 mL/min   Anion gap 9 5 - 15  Glucose, capillary     Status: Abnormal   Collection Time: 04/10/15  8:11 AM  Result Value Ref Range   Glucose-Capillary 242 (H) 65 - 99 mg/dL    Signed: Lucious Groves, DO 04/10/2015, 6:32 PM    Services Ordered on Discharge: none Equipment Ordered on Discharge: none

## 2015-04-10 NOTE — Progress Notes (Signed)
Patient ID: [name], male   DOB: 1951-06-13, 64 y.o.   MRN: [id number]    Subjective: Pt feels well today.  No abdominal complaints.  Tolerating liquid diet well.   Objective: Vital signs in last 24 hours: Temp:  [97.7 F (36.5 C)-97.9 F (36.6 C)] 97.7 F (36.5 C) (08/25 2146) Pulse Rate:  [65-72] 65 (08/25 2146) Resp:  [20] 20 (08/25 2146) BP: (129-146)/(60-74) 129/60 mmHg (08/25 2146) SpO2:  [98 %-100 %] 98 % (08/25 2146) Last BM Date: 04/09/15  Intake/Output from previous day: 08/25 0701 - 08/26 0700 In: 2190 [P.O.:240; I.V.:1952] Out: -  Intake/Output this shift:    PE: Abd: soft, Nt, ND, +BS  Lab Results:   Recent Labs  04/08/15 0120  WBC 9.1  HGB 14.7  HCT 44.9  PLT 140*   BMET  Recent Labs  04/09/15 0635 04/10/15 0608  NA 139 135  K 3.8 4.0  CL 107 103  CO2 25 23  GLUCOSE 149* 267*  BUN 10 10  CREATININE 1.13 1.16  CALCIUM 8.5* 8.8*   PT/INR No results for input(s): LABPROT, INR in the last 72 hours. CMP     Component Value Date/Time   NA 135 04/10/2015 0608   K 4.0 04/10/2015 0608   CL 103 04/10/2015 0608   CO2 23 04/10/2015 0608   GLUCOSE 267* 04/10/2015 0608   BUN 10 04/10/2015 0608   CREATININE 1.16 04/10/2015 0608   CALCIUM 8.8* 04/10/2015 0608   PROT 7.7 04/08/2015 0120   ALBUMIN 4.5 04/08/2015 0120   AST 29 04/08/2015 0120   ALT 27 04/08/2015 0120   ALKPHOS 155* 04/08/2015 0120   BILITOT 1.5* 04/08/2015 0120   GFRNONAA >60 04/10/2015 0608   GFRAA >60 04/10/2015 0608   Lipase     Component Value Date/Time   LIPASE 13* 04/08/2015 0120       Studies/Results: Dg Abd Portable 1v-small Bowel Obstruction Protocol-initial, 8 Hr Delay  04/08/2015   CLINICAL DATA:  Evaluate for small bowel obstruction. 90 cc of Gastrografin was administered through the NG tube at 12:20 p.m. today.  EXAM: PORTABLE ABDOMEN - 1 VIEW  COMPARISON:  CT from 04/08/2015 at 6:23 a.m.  FINDINGS: The enteric contrast material has progressed through  the colon and is at the level of the rectum. No dilated small bowel loops identified.  IMPRESSION: 1. Enteric contrast material has progressed through the colon up to the rectum. Along with absence of small bowel distension on the current film the findings argue against significant bowel obstruction.   Electronically Signed   By: Kerby Moors M.D.   On: 04/08/2015 20:30    Anti-infectives: Anti-infectives    None       Assessment/Plan  1. PSBO, resolved -on soft diet this am -no acute surgical needs -we will sign off, patient is surgically stable for DC when medically stable   LOS: 2 days    Elektra Wartman E 04/10/2015, 8:33 AM Pager: [phone]

## 2015-04-10 NOTE — Progress Notes (Signed)
Utilization Review completed. Sheralyn Pinegar RN BSN CM 

## 2015-04-10 NOTE — Progress Notes (Signed)
Diabetes Coordinator stated they had just met with the patient prior to admission. Patient stated he only needed education on the insulin pen and how to check his blood sugar. Patient instructed by DM Coordinator to watch DM videos while in hospital. Patient agreeable and taught how to watch videos.

## 2015-04-10 NOTE — Progress Notes (Signed)
[name] to be D/C'd Home per MD order.  Discussed with the patient and all questions fully answered.  VSS, Skin clean, dry and intact without evidence of skin break down, no evidence of skin tears noted. IV catheter discontinued intact. Site without signs and symptoms of complications. Dressing and pressure applied.  An After Visit Summary was printed and given to the patient. Patient received prescription.  D/c education completed with patient/family including follow up instructions, medication list, d/c activities limitations if indicated, with other d/c instructions as indicated by MD - patient able to verbalize understanding, all questions fully answered.   Patient instructed to return to ED, call 911, or call MD for any changes in condition.   Patient escorted via [place], and D/C home via private auto.  Patient watched all the diabetes videos and questions answered. Patient stated they were very helpful. Patient also practiced checking his blood sugar while here in the hospital.  L'ESPERANCE, RACHEL C 04/10/2015 11:21 AM

## 2015-04-13 ENCOUNTER — Encounter: Payer: Self-pay | Admitting: Gastroenterology

## 2015-06-29 ENCOUNTER — Encounter: Payer: Medicare Other | Admitting: Gastroenterology

## 2015-07-23 ENCOUNTER — Encounter: Payer: Self-pay | Admitting: Nurse Practitioner

## 2015-07-30 ENCOUNTER — Encounter: Payer: Self-pay | Admitting: Gastroenterology

## 2015-08-05 ENCOUNTER — Encounter: Payer: Medicare Other | Admitting: Gastroenterology

## 2015-09-03 ENCOUNTER — Emergency Department (HOSPITAL_COMMUNITY)
Admission: EM | Admit: 2015-09-03 | Discharge: 2015-09-03 | Disposition: A | Discharge status: Home / Self Care | Payer: Medicare Other | Attending: Emergency Medicine | Admitting: Emergency Medicine

## 2015-09-03 ENCOUNTER — Emergency Department (HOSPITAL_COMMUNITY): Payer: Medicare Other

## 2015-09-03 ENCOUNTER — Encounter (HOSPITAL_COMMUNITY): Payer: Self-pay

## 2015-09-03 DIAGNOSIS — Z8669 Personal history of other diseases of the nervous system and sense organs: Secondary | ICD-10-CM | POA: Insufficient documentation

## 2015-09-03 DIAGNOSIS — K219 Gastro-esophageal reflux disease without esophagitis: Secondary | ICD-10-CM | POA: Insufficient documentation

## 2015-09-03 DIAGNOSIS — Z7984 Long term (current) use of oral hypoglycemic drugs: Secondary | ICD-10-CM | POA: Diagnosis not present

## 2015-09-03 DIAGNOSIS — R109 Unspecified abdominal pain: Secondary | ICD-10-CM

## 2015-09-03 DIAGNOSIS — Z87891 Personal history of nicotine dependence: Secondary | ICD-10-CM | POA: Insufficient documentation

## 2015-09-03 DIAGNOSIS — R110 Nausea: Secondary | ICD-10-CM | POA: Diagnosis not present

## 2015-09-03 DIAGNOSIS — Z79899 Other long term (current) drug therapy: Secondary | ICD-10-CM | POA: Diagnosis not present

## 2015-09-03 DIAGNOSIS — Z7982 Long term (current) use of aspirin: Secondary | ICD-10-CM | POA: Insufficient documentation

## 2015-09-03 DIAGNOSIS — R1084 Generalized abdominal pain: Secondary | ICD-10-CM | POA: Insufficient documentation

## 2015-09-03 DIAGNOSIS — E11319 Type 2 diabetes mellitus with unspecified diabetic retinopathy without macular edema: Secondary | ICD-10-CM | POA: Insufficient documentation

## 2015-09-03 DIAGNOSIS — Z794 Long term (current) use of insulin: Secondary | ICD-10-CM | POA: Insufficient documentation

## 2015-09-03 DIAGNOSIS — R197 Diarrhea, unspecified: Secondary | ICD-10-CM | POA: Insufficient documentation

## 2015-09-03 DIAGNOSIS — Z8546 Personal history of malignant neoplasm of prostate: Secondary | ICD-10-CM | POA: Diagnosis not present

## 2015-09-03 LAB — COMPREHENSIVE METABOLIC PANEL
ALT: 22 U/L (ref 17–63)
ANION GAP: 8 (ref 5–15)
AST: 30 U/L (ref 15–41)
Albumin: 4.2 g/dL (ref 3.5–5.0)
Alkaline Phosphatase: 149 U/L — ABNORMAL HIGH (ref 38–126)
BUN: 15 mg/dL (ref 6–20)
CALCIUM: 9.8 mg/dL (ref 8.9–10.3)
CHLORIDE: 98 mmol/L — AB (ref 101–111)
CO2: 29 mmol/L (ref 22–32)
Creatinine, Ser: 1.44 mg/dL — ABNORMAL HIGH (ref 0.61–1.24)
GFR calc non Af Amer: 49 mL/min — ABNORMAL LOW (ref >60)
GFR, EST AFRICAN AMERICAN: 57 mL/min — AB (ref >60)
Glucose, Bld: 369 mg/dL — ABNORMAL HIGH (ref 65–99)
Potassium: 5.2 mmol/L — ABNORMAL HIGH (ref 3.5–5.1)
SODIUM: 135 mmol/L (ref 135–145)
Total Bilirubin: 1.2 mg/dL (ref 0.3–1.2)
Total Protein: 7.3 g/dL (ref 6.5–8.1)

## 2015-09-03 LAB — URINALYSIS, ROUTINE W REFLEX MICROSCOPIC
Bilirubin Urine: NEGATIVE
Glucose, UA: >1000 mg/dL — AB
HGB URINE DIPSTICK: NEGATIVE
Ketones, ur: NEGATIVE mg/dL
LEUKOCYTES UA: NEGATIVE
Nitrite: NEGATIVE
Protein, ur: NEGATIVE mg/dL
SPECIFIC GRAVITY, URINE: 1.035 — AB (ref 1.005–1.030)
pH: 6 (ref 5.0–8.0)

## 2015-09-03 LAB — LIPASE, BLOOD: LIPASE: 19 U/L (ref 11–51)

## 2015-09-03 LAB — CBC
HCT: 41.8 % (ref 39.0–52.0)
HEMOGLOBIN: 13.6 g/dL (ref 13.0–17.0)
MCH: 21.5 pg — AB (ref 26.0–34.0)
MCHC: 32.5 g/dL (ref 30.0–36.0)
MCV: 66 fL — AB (ref 78.0–100.0)
Platelets: 157 10*3/uL (ref 150–400)
RBC: 6.33 MIL/uL — AB (ref 4.22–5.81)
RDW: 15 % (ref 11.5–15.5)
WBC: 6.3 10*3/uL (ref 4.0–10.5)

## 2015-09-03 LAB — URINE MICROSCOPIC-ADD ON

## 2015-09-03 MED ORDER — ONDANSETRON HCL 4 MG/2ML IJ SOLN
4.0000 mg | Freq: Once | INTRAMUSCULAR | Status: AC
  Administered 2015-09-03: 4 mg via INTRAVENOUS
  Filled 2015-09-03: qty 2

## 2015-09-03 MED ORDER — HYDROCODONE-ACETAMINOPHEN 5-325 MG PO TABS: 1.0000 | ORAL_TABLET | Freq: Four times a day (QID) | ORAL | Status: DC | PRN

## 2015-09-03 MED ORDER — HYDROMORPHONE HCL 1 MG/ML IJ SOLN
1.0000 mg | Freq: Once | INTRAMUSCULAR | Status: AC
  Administered 2015-09-03: 1 mg via INTRAVENOUS
  Filled 2015-09-03: qty 1

## 2015-09-03 MED ORDER — ONDANSETRON 8 MG PO TBDP: 8.0000 mg | ORAL_TABLET | Freq: Three times a day (TID) | ORAL | Status: DC | PRN

## 2015-09-03 MED ORDER — IOHEXOL 300 MG/ML  SOLN
80.0000 mL | Freq: Once | INTRAMUSCULAR | Status: AC | PRN
  Administered 2015-09-03: 80 mL via INTRAVENOUS

## 2015-09-03 NOTE — ED Provider Notes (Signed)
CSN: [id number]     Arrival date & time 09/03/15  1437 History   First MD Initiated Contact with Patient 09/03/15 1951     Chief Complaint  Patient presents with  . Abdominal Pain      HPI Patient reports a history of abdominal surgery and history of small bowel obstruction.  He presents emergency department complaining of abdominal pain as well as some diarrhea and nausea since this morning.  He reports no vomiting.  He denies fevers and chills.  Reports this pain feels similar to his prior bowel obstruction.  His pain is moderate in severity.  He has not tried anything at home for pain or discomfort.  Denies urinary symptoms.  No fevers or chills.  No chest pain or shortness of breath.   Past Medical History  Diagnosis Date  . Fluid overload 09/03/2012    Post op  . Visual impairment   . Diabetic retinopathy associated with type 2 diabetes mellitus ([place]) 09/13/2014    He works for [employer]  . GERD (gastroesophageal reflux disease)   . Prostate cancer ([place])   . Diabetic retinopathy [facility])    Past Surgical History  Procedure Laterality Date  . Robot assisted laparoscopic radical prostatectomy  2000's    "had to finish manually after machine broke"  . Shoulder surgery  1970's    separation; from playing football"  . Laparotomy  08/23/2012    Procedure: EXPLORATORY LAPAROTOMY;  Surgeon: [name], DO;  Location: [facility] ORS;  Service: General;  Laterality: N/A;  exploratory laparotomy with lysis of adhesions  . Lysis of adhesion  08/23/2012    Procedure: LYSIS OF ADHESION;  Surgeon: [name], DO;  Location: [facility];  Service: General;;  . Inguinal hernia repair      [name] 07/12/2010), "don't remember which side"  . Eye surgery Bilateral     "laser OR for diabetic retinopathy"  . Cataract extraction w/ intraocular lens  implant, bilateral     Family History  Problem Relation Age of Onset  . Diabetes Mellitus II Mother    Social History  Substance Use Topics  .  Smoking status: Former Smoker -- 2.50 packs/day for 3 years    Types: Cigarettes    Quit date: 11/09/1966  . Smokeless tobacco: Never Used  . Alcohol Use: Yes     Comment: 04/08/2015 "maybe a beer/ or 2 or a glass of wine monthly"    Review of Systems  All other systems reviewed and are negative.     Allergies  Naproxen  Home Medications   Prior to Admission medications   Medication Sig Start Date End Date Taking? Authorizing Provider  albuterol (PROVENTIL HFA;VENTOLIN HFA) 108 (90 BASE) MCG/ACT inhaler Inhale 1-2 puffs into the lungs every 6 (six) hours as needed for wheezing or shortness of breath.   Yes Historical Provider, MD  aspirin 81 MG chewable tablet Chew 81 mg by mouth daily.   Yes Historical Provider, MD  atorvastatin (LIPITOR) 20 MG tablet Take 20 mg by mouth daily.   Yes Historical Provider, MD  cloNIDine (CATAPRES - DOSED IN MG/24 HR) 0.2 mg/24hr patch Place 0.2 mg onto the skin once a week. No specific day   Yes Historical Provider, MD  gabapentin (NEURONTIN) 300 MG capsule Take 1 capsule (300 mg total) by mouth 2 (two) times daily. Patient taking differently: Take 300 mg by mouth.  09/18/14  Yes Delfina Redwood, MD  glipiZIDE (GLUCOTROL) 10 MG tablet Take 1 tablet (  10 mg total) by mouth 2 (two) times daily before a meal. 09/18/14  Yes Delfina Redwood, MD  insulin glargine (LANTUS) 100 UNIT/ML injection Inject 0.1 mLs (10 Units total) into the skin at bedtime. 04/10/15  Yes Lucious Groves, DO  losartan (COZAAR) 25 MG tablet Take 25 mg by mouth daily.   Yes Historical Provider, MD  Multiple Vitamin (MULTIVITAMIN WITH MINERALS) TABS tablet Take 1 tablet by mouth daily.   Yes Historical Provider, MD  pantoprazole (PROTONIX) 40 MG tablet Take 1 tablet (40 mg total) by mouth daily. Take 30-60 min before first meal of the day 12/11/14  Yes Tanda Rockers, MD  Polyethyl Glycol-Propyl Glycol (SYSTANE OP) Place 1 drop into both eyes daily as needed (dry eyes).   Yes Historical  Provider, MD  HYDROcodone-acetaminophen (NORCO/VICODIN) 5-325 MG tablet Take 1 tablet by mouth every 6 (six) hours as needed for moderate pain. 09/03/15   Jola Schmidt, MD  hydrocortisone (CORTEF) 10 MG tablet Take 1 tablet (10 mg total) by mouth as directed. Patient not taking: Reported on 09/03/2015 04/10/15   Lucious Groves, DO  Lancets (FREESTYLE) lancets Use as instructed 09/18/14   Delfina Redwood, MD  metFORMIN (GLUCOPHAGE) 1000 MG tablet Take 1 tablet (1,000 mg total) by mouth 2 (two) times daily with a meal. Patient not taking: Reported on 09/03/2015 09/18/14   Delfina Redwood, MD  ondansetron (ZOFRAN ODT) 8 MG disintegrating tablet Take 1 tablet (8 mg total) by mouth every 8 (eight) hours as needed for nausea or vomiting. 09/03/15   Jola Schmidt, MD   BP 121/67 mmHg  Pulse 77  Temp(Src) 97.9 F (36.6 C) (Oral)  Resp 18  Ht 5\' 6"  (1.676 m)  Wt 165 lb (74.844 kg)  BMI 26.64 kg/m2  SpO2 96% Physical Exam  Constitutional: He is oriented to person, place, and time. He appears well-developed and well-nourished.  HENT:  Head: Normocephalic and atraumatic.  Eyes: EOM are normal.  Neck: Normal range of motion.  Cardiovascular: Normal rate, regular rhythm, normal heart sounds and intact distal pulses.   Pulmonary/Chest: Effort normal and breath sounds normal. No respiratory distress.  Abdominal: Soft. He exhibits no distension.  Mild generalized abdominal tenderness without guarding or rebound.  Musculoskeletal: Normal range of motion.  Neurological: He is alert and oriented to person, place, and time.  Skin: Skin is warm and dry.  Psychiatric: He has a normal mood and affect. Judgment normal.  Nursing note and vitals reviewed.   ED Course  Procedures (including critical care time) Labs Review Labs Reviewed  COMPREHENSIVE METABOLIC PANEL - Abnormal; Notable for the following:    Potassium 5.2 (*)    Chloride 98 (*)    Glucose, Bld 369 (*)    Creatinine, Ser 1.44 (*)     Alkaline Phosphatase 149 (*)    GFR calc non Af Amer 49 (*)    GFR calc Af Amer 57 (*)    All other components within normal limits  CBC - Abnormal; Notable for the following:    RBC 6.33 (*)    MCV 66.0 (*)    MCH 21.5 (*)    All other components within normal limits  URINALYSIS, ROUTINE W REFLEX MICROSCOPIC (NOT AT [facility]) - Abnormal; Notable for the following:    Specific Gravity, Urine 1.035 (*)    Glucose, UA >1000 (*)    All other components within normal limits  URINE MICROSCOPIC-ADD ON - Abnormal; Notable for the following:  Squamous Epithelial / LPF 0-5 (*)    Bacteria, UA RARE (*)    All other components within normal limits  LIPASE, BLOOD    Imaging Review Ct Abdomen Pelvis W Contrast  09/03/2015  CLINICAL DATA:  65 year old male with abdominal pain, nausea, diarrhea and vomiting. History of small-bowel obstruction. EXAM: CT ABDOMEN AND PELVIS WITH CONTRAST TECHNIQUE: Multidetector CT imaging of the abdomen and pelvis was performed using the standard protocol following bolus administration of intravenous contrast. CONTRAST:  40mL OMNIPAQUE IOHEXOL 300 MG/ML  SOLN COMPARISON:  CT the abdomen and pelvis 04/08/2015. FINDINGS: Lower chest: 5 mm subpleural nodule in the inferior aspect of the left lower lobe (image 10 of series 205), unchanged in retrospect compared to prior study 08/30/2012, considered benign, presumably a subpleural lymph node. Hepatobiliary: No cystic or solid hepatic lesions. No intra or extrahepatic biliary ductal dilatation. Gallbladder is normal in appearance. Pancreas: No pancreatic mass. No pancreatic ductal dilatation. No pancreatic or peripancreatic fluid or inflammatory changes. Spleen: Unremarkable. Adrenals/Urinary Tract: 12 mm simple cyst in the upper pole of the right kidney. Left kidney and bilateral adrenal glands are unremarkable in appearance. No hydroureteronephrosis. Urinary bladder is normal in appearance. Stomach/Bowel: Normal appearance of the  stomach. No pathologic dilatation of small bowel or colon. Normal appendix. Vascular/Lymphatic: Atherosclerosis throughout the abdominal and pelvic vasculature, without evidence of aneurysm or dissection. No lymphadenopathy noted in the abdomen or pelvis. Reproductive: Status post radical prostatectomy and pelvic lymph node dissection. Other: No significant volume of ascites.  No pneumoperitoneum. Musculoskeletal: There are no aggressive appearing lytic or blastic lesions noted in the visualized portions of the skeleton. IMPRESSION: 1. No acute findings in the abdomen or pelvis to account for the patient's symptoms. 2. Normal appendix. 3. Atherosclerosis. 4. Additional incidental findings, as above. Electronically Signed   By: Vinnie Langton M.D.   On: 09/03/2015 22:13   I have personally reviewed and evaluated these images and lab results as part of my medical decision-making.   EKG Interpretation None      MDM   Final diagnoses:  Abdominal pain, unspecified abdominal location    10:46 PM Patient feels much better at this time.  CT scan without acute pathology.  No signs of bowel obstruction.  He is having diarrhea and flatus.  Discharge home in good condition.  Home with nausea medicine in a short course of pain medication.  He understands to return to the ER for new or worsening symptoms    Jola Schmidt, MD 09/03/15 2246

## 2015-09-03 NOTE — Discharge Instructions (Signed)

## 2015-09-03 NOTE — ED Notes (Signed)
Patient here with abdominal pain since 0600. Reports nausea with same. No vomiting, no diarrhea

## 2015-09-03 NOTE — ED Notes (Signed)
Taken to CT at this time. 

## 2015-09-18 ENCOUNTER — Ambulatory Visit (AMBULATORY_SURGERY_CENTER): Payer: Self-pay

## 2015-09-18 VITALS — Ht 66.0 in | Wt 169.8 lb

## 2015-09-18 DIAGNOSIS — Z1211 Encounter for screening for malignant neoplasm of colon: Secondary | ICD-10-CM

## 2015-09-18 MED ORDER — SUPREP BOWEL PREP KIT 17.5-3.13-1.6 GM/177ML PO SOLN: 1.0000 | Freq: Once | ORAL | Status: DC

## 2015-09-18 NOTE — Progress Notes (Signed)
No allergies to eggs or soy No past problems with anesthesia No home oxygen No diet/weight loss meds  Has email and internet; refused emmi 

## 2015-09-29 ENCOUNTER — Telehealth: Payer: Self-pay | Admitting: Gastroenterology

## 2015-09-29 NOTE — Telephone Encounter (Signed)
No charge this time. For future calls when patients complains of fever please note the pts tempature and if the pt has not taken their temperatue please ask them to do so.

## 2015-09-29 NOTE — Telephone Encounter (Signed)
I called the patient to remind him of his procedure for tomorrow. He states he needs to cancel his procedure, because he is running a fever and isn't well. Would you like to charge a late cancellation fee?

## 2015-09-30 ENCOUNTER — Encounter: Payer: Medicare Other | Admitting: Gastroenterology

## 2016-01-15 ENCOUNTER — Other Ambulatory Visit: Payer: Self-pay | Admitting: Occupational Medicine

## 2016-01-15 ENCOUNTER — Ambulatory Visit: Payer: Self-pay

## 2016-01-15 DIAGNOSIS — M542 Cervicalgia: Secondary | ICD-10-CM

## 2016-12-29 ENCOUNTER — Emergency Department (HOSPITAL_COMMUNITY)
Admission: EM | Admit: 2016-12-29 | Discharge: 2016-12-29 | Disposition: A | Discharge status: Home / Self Care | Payer: Medicare Other | Attending: Emergency Medicine | Admitting: Emergency Medicine

## 2016-12-29 ENCOUNTER — Emergency Department (HOSPITAL_COMMUNITY): Payer: Medicare Other

## 2016-12-29 ENCOUNTER — Encounter (HOSPITAL_COMMUNITY): Payer: Self-pay | Admitting: Emergency Medicine

## 2016-12-29 DIAGNOSIS — Z794 Long term (current) use of insulin: Secondary | ICD-10-CM | POA: Diagnosis not present

## 2016-12-29 DIAGNOSIS — Y999 Unspecified external cause status: Secondary | ICD-10-CM | POA: Diagnosis not present

## 2016-12-29 DIAGNOSIS — E1165 Type 2 diabetes mellitus with hyperglycemia: Secondary | ICD-10-CM | POA: Diagnosis not present

## 2016-12-29 DIAGNOSIS — Z8546 Personal history of malignant neoplasm of prostate: Secondary | ICD-10-CM | POA: Insufficient documentation

## 2016-12-29 DIAGNOSIS — I10 Essential (primary) hypertension: Secondary | ICD-10-CM | POA: Diagnosis not present

## 2016-12-29 DIAGNOSIS — R0789 Other chest pain: Secondary | ICD-10-CM | POA: Diagnosis not present

## 2016-12-29 DIAGNOSIS — Z7982 Long term (current) use of aspirin: Secondary | ICD-10-CM | POA: Diagnosis not present

## 2016-12-29 DIAGNOSIS — F17210 Nicotine dependence, cigarettes, uncomplicated: Secondary | ICD-10-CM | POA: Diagnosis not present

## 2016-12-29 DIAGNOSIS — Z79899 Other long term (current) drug therapy: Secondary | ICD-10-CM | POA: Diagnosis not present

## 2016-12-29 DIAGNOSIS — E11319 Type 2 diabetes mellitus with unspecified diabetic retinopathy without macular edema: Secondary | ICD-10-CM | POA: Diagnosis not present

## 2016-12-29 DIAGNOSIS — S299XXA Unspecified injury of thorax, initial encounter: Secondary | ICD-10-CM | POA: Diagnosis present

## 2016-12-29 DIAGNOSIS — Y92410 Unspecified street and highway as the place of occurrence of the external cause: Secondary | ICD-10-CM | POA: Insufficient documentation

## 2016-12-29 DIAGNOSIS — Y939 Activity, unspecified: Secondary | ICD-10-CM | POA: Insufficient documentation

## 2016-12-29 LAB — CBC WITH DIFFERENTIAL/PLATELET
Basophils Absolute: 0 10*3/uL (ref 0.0–0.1)
Basophils Relative: 1 %
Eosinophils Absolute: 0.2 10*3/uL (ref 0.0–0.7)
Eosinophils Relative: 4 %
HCT: 39.9 % (ref 39.0–52.0)
Hemoglobin: 12.9 g/dL — ABNORMAL LOW (ref 13.0–17.0)
Lymphocytes Relative: 26 %
Lymphs Abs: 1.1 10*3/uL (ref 0.7–4.0)
MCH: 20.4 pg — ABNORMAL LOW (ref 26.0–34.0)
MCHC: 32.3 g/dL (ref 30.0–36.0)
MCV: 63 fL — ABNORMAL LOW (ref 78.0–100.0)
Monocytes Absolute: 0.2 10*3/uL (ref 0.1–1.0)
Monocytes Relative: 6 %
Neutro Abs: 2.6 10*3/uL (ref 1.7–7.7)
Neutrophils Relative %: 63 %
Platelets: 157 10*3/uL (ref 150–400)
RBC: 6.33 MIL/uL — ABNORMAL HIGH (ref 4.22–5.81)
RDW: 15.9 % — ABNORMAL HIGH (ref 11.5–15.5)
WBC: 4.1 10*3/uL (ref 4.0–10.5)

## 2016-12-29 LAB — COMPREHENSIVE METABOLIC PANEL
ALT: 31 U/L (ref 17–63)
AST: 30 U/L (ref 15–41)
Albumin: 3.5 g/dL (ref 3.5–5.0)
Alkaline Phosphatase: 117 U/L (ref 38–126)
Anion gap: 9 (ref 5–15)
BUN: 19 mg/dL (ref 6–20)
CO2: 25 mmol/L (ref 22–32)
Calcium: 9.2 mg/dL (ref 8.9–10.3)
Chloride: 100 mmol/L — ABNORMAL LOW (ref 101–111)
Creatinine, Ser: 1.27 mg/dL — ABNORMAL HIGH (ref 0.61–1.24)
GFR calc Af Amer: >60 mL/min (ref >60)
GFR calc non Af Amer: 57 mL/min — ABNORMAL LOW (ref >60)
Glucose, Bld: 224 mg/dL — ABNORMAL HIGH (ref 65–99)
Potassium: 4.2 mmol/L (ref 3.5–5.1)
Sodium: 134 mmol/L — ABNORMAL LOW (ref 135–145)
Total Bilirubin: 1.1 mg/dL (ref 0.3–1.2)
Total Protein: 7.1 g/dL (ref 6.5–8.1)

## 2016-12-29 LAB — I-STAT CREATININE, ED: Creatinine, Ser: 1.3 mg/dL — ABNORMAL HIGH (ref 0.61–1.24)

## 2016-12-29 MED ORDER — IOPAMIDOL (ISOVUE-300) INJECTION 61%
INTRAVENOUS | Status: AC
  Administered 2016-12-29: 100 mL via INTRAVENOUS
  Filled 2016-12-29: qty 100

## 2016-12-29 MED ORDER — METHOCARBAMOL 500 MG PO TABS: 500.0000 mg | ORAL_TABLET | Freq: Two times a day (BID) | ORAL | 0 refills | Status: DC

## 2016-12-29 MED ORDER — MORPHINE SULFATE (PF) 4 MG/ML IV SOLN
4.0000 mg | Freq: Once | INTRAVENOUS | Status: AC
  Administered 2016-12-29: 4 mg via INTRAVENOUS
  Filled 2016-12-29: qty 1

## 2016-12-29 MED ORDER — HYDROCODONE-ACETAMINOPHEN 5-325 MG PO TABS: 1.0000 | ORAL_TABLET | ORAL | 0 refills | Status: DC | PRN
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
## 2016-12-29 NOTE — ED Notes (Signed)
Patient transported to CT 
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
## 2017-12-29 ENCOUNTER — Institutional Professional Consult (permissible substitution): Payer: Self-pay | Admitting: Internal Medicine

## 2018-02-09 ENCOUNTER — Ambulatory Visit (INDEPENDENT_AMBULATORY_CARE_PROVIDER_SITE_OTHER): Payer: Medicare Other | Admitting: Internal Medicine

## 2018-02-09 ENCOUNTER — Ambulatory Visit (INDEPENDENT_AMBULATORY_CARE_PROVIDER_SITE_OTHER)
Admission: RE | Admit: 2018-02-09 | Discharge: 2018-02-09 | Disposition: A | Discharge status: Home / Self Care | Payer: Medicare Other | Source: Ambulatory Visit | Attending: Internal Medicine | Admitting: Internal Medicine

## 2018-02-09 ENCOUNTER — Encounter: Payer: Self-pay | Admitting: *Deleted

## 2018-02-09 ENCOUNTER — Encounter: Payer: Self-pay | Admitting: Internal Medicine

## 2018-02-09 VITALS — BP 124/84 | HR 67 | Ht 66.0 in | Wt 177.0 lb

## 2018-02-09 DIAGNOSIS — R0609 Other forms of dyspnea: Secondary | ICD-10-CM

## 2018-02-09 DIAGNOSIS — D869 Sarcoidosis, unspecified: Secondary | ICD-10-CM | POA: Diagnosis not present

## 2018-02-09 LAB — PULMONARY FUNCTION TEST
FEF 25-75 PRE: 3.27 L/s
FEF2575-%PRED-PRE: 149 %
FEV1-%PRED-PRE: 98 %
FEV1-PRE: 2.42 L
FEV1FVC-%Pred-Pre: 110 %
FEV6-%Pred-Pre: 91 %
FEV6-Pre: 2.85 L
FEV6FVC-%Pred-Pre: 105 %
FVC-%PRED-PRE: 87 %
FVC-Pre: 2.85 L
Pre FEV1/FVC ratio: 85 %
Pre FEV6/FVC Ratio: 100 %

## 2018-02-09 MED ORDER — PANTOPRAZOLE SODIUM 40 MG PO TBEC: DELAYED_RELEASE_TABLET | ORAL | 2 refills | Status: DC

## 2018-02-09 NOTE — Progress Notes (Signed)
[name], male    DOB: 1950/11/09,     MRN: [id number]    Brief patient profile:  41 yobm quit smoking 1968 dx with sarcoid by [place] 2007 pt thinks he prednisone x several months and got better and seen in pulmonary clinic here 2014 with UACS due ACEi vs gerd so rx ARB and GERD rx and symptoms resolved but recurred 11/2017 with sob and overt HB      02/09/2018  Self referral ov/[name]/ to re-establish re recurrent doe rec by [name] Abrahams FNP Chief Complaint  Patient presents with  . Pulmonary Consult    Self referral for Sarcoid.  Pt c/o increased DOE and heartburn x 2 months. He states that he gets SOB with walking "a few yards".    Dyspnea:  Indolent onset progressive doe x across the room assoc with overt HB / no cp Cough: no Sleep: x 2 pillows  SABA use: none   No obvious day to day or daytime variability or assoc excess/ purulent sputum or mucus plugs or hemoptysis or cp or chest tightness, subjective wheeze or overt sinus  symptoms.   Sleeps ok on 2 pillows  without nocturnal  or early am exacerbation  of respiratory  c/o's or need for noct saba. Also denies any obvious fluctuation of symptoms with weather or environmental changes or other aggravating or alleviating factors except as outlined above   No unusual exposure hx or h/o childhood pna/ asthma or knowledge of premature birth.  Current Allergies, Complete Past Medical History, Past Surgical History, Family History, and Social History were reviewed in [employer] record.  ROS  The following are not active complaints unless bolded Hoarseness, sore throat, dysphagia, dental problems, itching, sneezing,  nasal congestion or discharge of excess mucus or purulent secretions, ear ache,   fever, chills, sweats, unintended wt loss or wt gain, classically pleuritic or exertional cp,  orthopnea pnd or arm/hand swelling  or leg swelling, presyncope, palpitations, abdominal pain, anorexia, nausea, vomiting,  diarrhea  or change in bowel habits or change in bladder habits, change in stools or change in urine, dysuria, hematuria,  rash, arthralgias, visual complaints, headache, numbness, weakness or ataxia or problems with walking or coordination,  change in mood or  memory.        Current Meds  Medication Sig  . aspirin 81 MG chewable tablet Chew 81 mg by mouth daily.  [name] atorvastatin (LIPITOR) 20 MG tablet Take 20 mg by mouth daily.  . chlorthalidone (HYGROTON) 25 MG tablet Take 25 mg by mouth daily.  . ciprofloxacin (CILOXAN) 0.3 % ophthalmic solution As directed  . gabapentin (NEURONTIN) 300 MG capsule Take 300 mg by mouth 3 (three) times daily.  [name] HYDROcodone-acetaminophen (NORCO/VICODIN) 5-325 MG tablet Take 1 tablet by mouth every 4 (four) hours as needed.  . insulin glargine (LANTUS) 100 UNIT/ML injection Inject 0.1 mLs (10 Units total) into the skin at bedtime. (Patient taking differently: Inject 30 Units into the skin 2 (two) times daily. )  . Lancets (FREESTYLE) lancets Use as instructed  . losartan (COZAAR) 25 MG tablet Take 25 mg by mouth daily.  . methocarbamol (ROBAXIN) 500 MG tablet Take 1 tablet (500 mg total) by mouth 2 (two) times daily.  . Multiple Vitamin (MULTIVITAMIN WITH MINERALS) TABS tablet Take 1 tablet by mouth daily.  Vladimir Faster Glycol-Propyl Glycol (SYSTANE OP) Place 1 drop into both eyes daily as needed (dry eyes).      Past Medical History:  Diagnosis Date  . Diabetic retinopathy ([place])   . Diabetic retinopathy associated with type 2 diabetes mellitus ([place]) 09/13/2014   He works for [employer]  . Fluid overload 09/03/2012   Post op  . GERD (gastroesophageal reflux disease)   . Prostate cancer ([place])   . Visual impairment              Objective:     BP 124/84 (BP Location: Left Arm, Cuff Size: Normal)   Pulse 67   Ht 5\' 6"  (1.676 m)   Wt 177 lb (80.3 kg)   SpO2 98%   BMI 28.57 kg/m   SpO2: 98 % RA  Very easily confused elderly bm >  stated age   Wt Readings from Last 3 Encounters:  02/09/18 177 lb (80.3 kg)  09/18/15 169 lb 12.8 oz (77 kg)  09/03/15 165 lb (74.8 kg)      HEENT: nl   turbinates bilaterally, and oropharynx. Nl external ear canals without cough reflex/ very poor dentition    NECK :  without JVD/Nodes/TM/ nl carotid upstrokes bilaterally   LUNGS: no acc muscle use,  Nl contour chest which is clear to A and P bilaterally without cough on insp or exp maneuvers   CV:  RRR  no s3 or murmur or increase in P2, and no edema   ABD:  soft and nontender with nl inspiratory excursion in the supine position. No bruits or organomegaly appreciated, bowel sounds nl  MS:  Nl gait/ ext warm without deformities, calf tenderness, cyanosis or clubbing No obvious joint restrictions   SKIN: warm and dry without lesions    NEURO:  alert, approp, nl sensorium with  no motor or cerebellar deficits apparent.     CXR PA and Lateral:   02/09/2018 :    I personally reviewed images and  impression as follows:  wnl         Assessment   DOE (dyspnea on exertion) Onset 11/2017 - 02/09/2018  Walked RA x 3 laps @ 185 ft each stopped due to  End of study, nl pace, no desat, some sob and legs tired - Spirometry 02/09/2018  FEV1 2.42 (98%)  Ratio 85 with truncation / extreme flattening of insp loop and small portion of exp loop as well  - 02/09/2018 rec max rx for gerd and return in 6 weeks with all meds in hand    Symptoms are markedly disproportionate to objective findings and not clear to what extent this is actually a pulmonary  problem but pt does appear to have difficult to sort out respiratory symptoms of unknown origin for which  DDX  = almost all start with A and  include Adherence, Ace Inhibitors, Acid Reflux, Active Sinus Disease, Alpha 1 Antitripsin deficiency, Anxiety masquerading as Airways dz,  ABPA,  Allergy(esp in young), Aspiration (esp in elderly), Adverse effects of meds,  Active smokers, A bunch of PE's/clot  burden (a few small clots can't cause this syndrome unless there is already severe underlying pulm or vascular dz with poor reserve),  Anemia or thyroid disorder, plus two Bs  = Bronchiectasis and Beta blocker use..and one C= CHF     Adherence is always the initial "prime suspect" and is a multilayered concern that requires a "trust but verify" approach in every patient - starting with knowing how to use medications, especially inhalers, correctly, keeping up with refills and understanding the fundamental difference between maintenance and prns vs those medications only taken for a  very short course and then stopped and not refilled.  - very shaky on details of care - rec return with all meds in hand using a trust but verify approach to confirm accurate Medication  Reconciliation The principal here is that until we are certain that the  patients are doing what we've asked, it makes no sense to ask them to do more.   ? Acid (or non-acid) GERD > always difficult to exclude as up to 75% of pts in some series report no assoc GI/ Heartburn symptoms> rec max (24h)  acid suppression and diet restrictions/ reviewed and instructions given in writing.   ? Adverse drug effects - h/o ACEi induced UACS and now on losartan - For reasons that may related to vascular permability and nitric oxide pathways but not elevated  bradykinin levels (as seen with  ACEi use) losartan in the generic form has been reported now from mulitple sources  to cause a similar pattern of non-specific  upper airway symptoms as seen with acei.   This has not been reported with exposure to the other ARB's to date, so it may be reasonable  to try either generic diovan or avapro if ARB needed or use an alternative class altogether.  See:  Lelon Frohlich Allergy Asthma Immunol  2008: 101: p [phone]    ? Anxiety/depression/ deconditioning > usually at the bottom of this list of usual suspects but should be   higher on this pt's based on H and P (as clearly  he can walk more than a few yards as his hx suggested) may interfere with adherence and also interpretation of response or lack thereof to symptom management which can be quite subjective.   F/u in 4 weeks with repeat full pfts   Total time devoted to counseling  > 50 % of initial 60 min office visit:  review case with pt/ discussion of options/alternatives/ personally creating written customized instructions  in presence of pt  then going over those specific  Instructions directly with the pt including how to use all of the meds but in particular covering each new medication in detail and the difference between the maintenance= "automatic" meds and the prns using an action plan format for the latter (If this problem/symptom => do that organization reading Left to right).  Please see AVS from this visit for a full list of these instructions which I personally wrote for this pt and  are unique to this visit.    Sarcoidosis : Normal PFTs 07/27/06, including diffusion capacity ACE I level <3-  2007 CT of chest for prostate cancer evaluation 06/2006 showed mediastinal and  bilateral hilar adenopathy consistent with pulmonary sarcoid.  A good rule of thumb is that >95% of pts with active sarcoid in any organ will have some plain cxr changes - on the other hand  if there are active pulmonary symptoms the cxr will look much worse than the patient:  No evidence of either scenario here/ strongly doubt active dz       Christinia Gully, MD 02/09/2018

## 2018-02-09 NOTE — Patient Instructions (Signed)
Protonix 40 mg Take 30- 60 min before your first and last meals of the day   GERD (REFLUX)  is an extremely common cause of respiratory symptoms just like yours , many times with no obvious heartburn at all.    It can be treated with medication, but also with lifestyle changes including elevation of the head of your bed (ideally with 6 inch  bed blocks),  Smoking cessation, avoidance of late meals, excessive alcohol, and avoid fatty foods, chocolate, peppermint, colas, red wine, and acidic juices such as orange juice.  NO MINT OR MENTHOL PRODUCTS SO NO COUGH DROPS   USE SUGARLESS CANDY INSTEAD (Jolley ranchers or Stover's or Life Savers) or even ice chips will also do - the key is to swallow to prevent all throat clearing. NO OIL BASED VITAMINS - use powdered substitutes.    Please remember to go to the  x-ray department downstairs in the basement  for your tests - we will call you with the results when they are available.     Please schedule a follow up office visit in 4 weeks, sooner if needed  with all medications /inhalers/ solutions in hand so we can verify exactly what you are taking. This includes all medications from all doctors and over the counters

## 2018-02-09 NOTE — Progress Notes (Signed)
Spirometry done today. 

## 2018-02-10 ENCOUNTER — Encounter: Payer: Self-pay | Admitting: Internal Medicine

## 2018-02-10 NOTE — Assessment & Plan Note (Addendum)
Onset 11/2017 - 02/09/2018  Walked RA x 3 laps @ 185 ft each stopped due to  End of study, nl pace, no desat, some sob and legs tired - Spirometry 02/09/2018  FEV1 2.42 (98%)  Ratio 85 with truncation / extreme flattening of insp loop and small portion of exp loop as well  - 02/09/2018 rec max rx for gerd and return in 6 weeks with all meds in hand    Symptoms are markedly disproportionate to objective findings and not clear to what extent this is actually a pulmonary  problem but pt does appear to have difficult to sort out respiratory symptoms of unknown origin for which  DDX  = almost all start with A and  include Adherence, Ace Inhibitors, Acid Reflux, Active Sinus Disease, Alpha 1 Antitripsin deficiency, Anxiety masquerading as Airways dz,  ABPA,  Allergy(esp in young), Aspiration (esp in elderly), Adverse effects of meds,  Active smokers, A bunch of PE's/clot burden (a few small clots can't cause this syndrome unless there is already severe underlying pulm or vascular dz with poor reserve),  Anemia or thyroid disorder, plus two Bs  = Bronchiectasis and Beta blocker use..and one C= CHF     Adherence is always the initial "prime suspect" and is a multilayered concern that requires a "trust but verify" approach in every patient - starting with knowing how to use medications, especially inhalers, correctly, keeping up with refills and understanding the fundamental difference between maintenance and prns vs those medications only taken for a very short course and then stopped and not refilled.  - very shaky on details of care - rec return with all meds in hand using a trust but verify approach to confirm accurate Medication  Reconciliation The principal here is that until we are certain that the  patients are doing what we've asked, it makes no sense to ask them to do more.   ? Acid (or non-acid) GERD > always difficult to exclude as up to 75% of pts in some series report no assoc GI/ Heartburn symptoms>  rec max (24h)  acid suppression and diet restrictions/ reviewed and instructions given in writing.   ? Adverse drug effects - h/o ACEi induced UACS and now on losartan - For reasons that may related to vascular permability and nitric oxide pathways but not elevated  bradykinin levels (as seen with  ACEi use) losartan in the generic form has been reported now from mulitple sources  to cause a similar pattern of non-specific  upper airway symptoms as seen with acei.   This has not been reported with exposure to the other ARB's to date, so it may be reasonable  to try either generic diovan or avapro if ARB needed or use an alternative class altogether.  See:  Lelon Frohlich Allergy Asthma Immunol  2008: 101: p [phone]    ? Anxiety/depression/ deconditioning > usually at the bottom of this list of usual suspects but should be   higher on this pt's based on H and P (as clearly he can walk more than a few yards as his hx suggested) may interfere with adherence and also interpretation of response or lack thereof to symptom management which can be quite subjective.   F/u in 4 weeks with repeat full pfts   Total time devoted to counseling  > 50 % of initial 60 min office visit:  review case with pt/ discussion of options/alternatives/ personally creating written customized instructions  in presence of pt  then going over  those specific  Instructions directly with the pt including how to use all of the meds but in particular covering each new medication in detail and the difference between the maintenance= "automatic" meds and the prns using an action plan format for the latter (If this problem/symptom => do that organization reading Left to right).  Please see AVS from this visit for a full list of these instructions which I personally wrote for this pt and  are unique to this visit.

## 2018-02-10 NOTE — Assessment & Plan Note (Signed)
:   Normal PFTs 07/27/06, including diffusion capacity ACE I level <3-  2007 CT of chest for prostate cancer evaluation 06/2006 showed mediastinal and  bilateral hilar adenopathy consistent with pulmonary sarcoid.  A good rule of thumb is that >95% of pts with active sarcoid in any organ will have some plain cxr changes - on the other hand  if there are active pulmonary symptoms the cxr will look much worse than the patient:  No evidence of either scenario here/ strongly doubt active dz

## 2018-02-12 ENCOUNTER — Telehealth: Payer: Self-pay | Admitting: Internal Medicine

## 2018-02-12 NOTE — Telephone Encounter (Signed)
Tanda Rockers, MD  Rosana Berger, CMA        Call pt: Reviewed cxr and no acute change so no change in recommendations made at ov   Pt is aware of results and voiced her understanding. Nothing further is needed.

## 2018-03-14 ENCOUNTER — Ambulatory Visit: Payer: Self-pay | Admitting: Internal Medicine

## 2018-04-11 ENCOUNTER — Ambulatory Visit: Payer: Self-pay | Admitting: Internal Medicine

## 2018-04-19 ENCOUNTER — Ambulatory Visit: Payer: Self-pay | Admitting: Internal Medicine

## 2018-05-09 ENCOUNTER — Ambulatory Visit: Payer: Self-pay | Admitting: Internal Medicine

## 2018-07-18 ENCOUNTER — Encounter: Payer: Self-pay | Admitting: Family Medicine

## 2018-07-18 ENCOUNTER — Ambulatory Visit (INDEPENDENT_AMBULATORY_CARE_PROVIDER_SITE_OTHER): Payer: Medicare Other | Admitting: Family Medicine

## 2018-07-18 ENCOUNTER — Other Ambulatory Visit: Payer: Self-pay

## 2018-07-18 VITALS — BP 176/73 | HR 69 | Temp 98.6°F | Ht 66.0 in | Wt 178.4 lb

## 2018-07-18 DIAGNOSIS — I10 Essential (primary) hypertension: Secondary | ICD-10-CM

## 2018-07-18 DIAGNOSIS — Z1211 Encounter for screening for malignant neoplasm of colon: Secondary | ICD-10-CM | POA: Diagnosis not present

## 2018-07-18 DIAGNOSIS — E1165 Type 2 diabetes mellitus with hyperglycemia: Secondary | ICD-10-CM

## 2018-07-18 DIAGNOSIS — E782 Mixed hyperlipidemia: Secondary | ICD-10-CM

## 2018-07-18 DIAGNOSIS — Z794 Long term (current) use of insulin: Secondary | ICD-10-CM

## 2018-07-18 MED ORDER — ATORVASTATIN CALCIUM 40 MG PO TABS: 20.0000 mg | ORAL_TABLET | Freq: Every day | ORAL | 3 refills | Status: DC

## 2018-07-18 MED ORDER — INSULIN GLARGINE (2 UNIT DIAL) 300 UNIT/ML ~~LOC~~ SOPN: 50.0000 [IU] | PEN_INJECTOR | Freq: Every day | SUBCUTANEOUS | 2 refills | Status: DC

## 2018-07-18 MED ORDER — DULAGLUTIDE 0.75 MG/0.5ML ~~LOC~~ SOAJ: 0.7500 mg | SUBCUTANEOUS | 3 refills | Status: DC

## 2018-07-18 MED ORDER — LOSARTAN POTASSIUM 100 MG PO TABS: 100.0000 mg | ORAL_TABLET | Freq: Every day | ORAL | 1 refills | Status: DC

## 2018-07-18 MED ORDER — GABAPENTIN 300 MG PO CAPS: 300.0000 mg | ORAL_CAPSULE | Freq: Three times a day (TID) | ORAL | 3 refills | Status: DC

## 2018-07-18 MED ORDER — CHLORTHALIDONE 25 MG PO TABS: 25.0000 mg | ORAL_TABLET | Freq: Every day | ORAL | 1 refills | Status: DC

## 2018-07-18 MED ORDER — BLOOD GLUCOSE MONITOR KIT: PACK | 11 refills | Status: DC

## 2018-07-18 NOTE — Patient Instructions (Addendum)
  Stop lantus, Start toujeo 50 units once a day, bedtime is ok. Increase by 2 units every 3 days until EITHER fasting 90-130 or you have reached 80 units  Start trulicity one pen per week  Increasing losartan from 50mg  to 100mg  a day Continue with chlorthalidone 25mg  daily bp goal is less than 130/80  Increasing atorvastatin from 20mg  to 40mg  Goal of LDL less than 70   If you have lab work done today you will be contacted with your lab results within the next 2 weeks.  If you have not heard from [place] then please contact us. The fastest way to get your results is to register for My Chart.   IF you received an x-ray today, you will receive an invoice from [facility]. Please contact [facility] at [phone] with questions or concerns regarding your invoice.   IF you received labwork today, you will receive an invoice from [facility]. Please contact [facility] at [phone] with questions or concerns regarding your invoice.   Our billing staff will not be able to assist you with questions regarding bills from these companies.  You will be contacted with the lab results as soon as they are available. The fastest way to get your results is to activate your My Chart account. Instructions are located on the last page of this paperwork. If you have not heard from [place] regarding the results in 2 weeks, please contact this office.

## 2018-07-18 NOTE — Progress Notes (Signed)
12/4/20193:50 PM  [name] 1950-10-03, 68 y.o. male [id number]  Chief Complaint  Patient presents with  . Establish Care    does not take bp meds but 2x a wk  and thats only when he remembers    HPI:   Patient is a 67 y.o. male with past medical history significant for HTN, DM2, HLP, who presents today to establish care  Previous PCP Dr Ronnald Ramp Last OV June 2019 Dismissed in Sept 2019 for repeat No show - he has been helping take care of his elderly mother who lives in [place]  June 2019 labs a1c 10.0 crt 1.30, gfr 65 March 2019 labs LDL 112  lantus 32units twice day, sometimes does once a day Tends to miss evening dose He reports he used to be on oral medications He has occ tingling, numbness of feet H/o  Retinopathy -[place] He does not check cbgs at home Needs new glucometer Reports lows: dizzy, weak, takes orange juice Happens about once or twice a week, tends to skip breakfast Denies any h/o pancreatitis or gastrparesis  Takes losartan 18m and chlorthalidone 266mWill be seeing Dr ChSherrian Diverscards Aug 23 2018 Having increase swelling of legs, SOB, occ PND Denies chest pain or cough  Takes Lipitor 2046m  Fall Risk  07/18/2018  Falls in the past year? 0     [facility]  Decreased Interest 0  Down, Depressed, Hopeless 0  PHQ - 2 Score 0    Allergies  Allergen Reactions  . Ace Inhibitors Itching and Cough  . Naproxen Anaphylaxis    REACTION: Throat swells and cannot breathe    Prior to Admission medications   Medication Sig Start Date End Date Taking? Authorizing Provider  aspirin 81 MG chewable tablet Chew 81 mg by mouth daily.   Yes [provider]  atorvastatin (LIPITOR) 20 MG tablet Take 20 mg by mouth daily.   Yes [provider]  chlorthalidone (HYGROTON) 25 MG tablet Take 25 mg by mouth daily.   Yes [provider]  ciprofloxacin (CILOXAN) 0.3 % ophthalmic solution As directed 02/02/18  Yes  [provider]  gabapentin (NEURONTIN) 300 MG capsule Take 300 mg by mouth 3 (three) times daily.   Yes [provider]  HYDROcodone-acetaminophen (NORCO/VICODIN) 5-325 MG tablet Take 1 tablet by mouth every 4 (four) hours as needed. 12/29/16  Yes Upstill, ShaNehemiah SettleA-C  insulin glargine (LANTUS) 100 UNIT/ML injection Inject 0.1 mLs (10 Units total) into the skin at bedtime. Patient taking differently: Inject 30 Units into the skin 2 (two) times daily.  04/10/15  Yes HofLucious GrovesO  Lancets (FREESTYLE) lancets Use as instructed 09/18/14  Yes SulDelfina RedwoodD  Lancets 30G MISC  04/06/16  Yes [provider]  losartan (COZAAR) 25 MG tablet Take 25 mg by mouth daily.   Yes [provider]  methocarbamol (ROBAXIN) 500 MG tablet Take 1 tablet (500 mg total) by mouth 2 (two) times daily. 12/29/16  Yes UpsCharlann LangeA-C  Multiple Vitamin (MULTIVITAMIN WITH MINERALS) TABS tablet Take 1 tablet by mouth daily.   Yes [provider]  ONEHarper/23/17  Yes [provider]  pantoprazole (PROTONIX) 40 MG tablet Take 30- 60 min before your first and last meals of the day 02/09/18  Yes WerTanda RockersD  Polyethyl Glycol-Propyl Glycol (SYSTANE OP) Place 1 drop into both eyes daily as needed (dry eyes).  Yes [provider]    Past Medical History:  Diagnosis Date  . Diabetic retinopathy ([place])   . Diabetic retinopathy associated with type 2 diabetes mellitus ([place]) 09/13/2014   He works for [employer]  . Fluid overload 09/03/2012   Post op  . GERD (gastroesophageal reflux disease)   . Prostate cancer ([place])   . Visual impairment     Past Surgical History:  Procedure Laterality Date  . CATARACT EXTRACTION W/ INTRAOCULAR LENS  IMPLANT, BILATERAL    . EYE SURGERY Bilateral    "laser OR for diabetic retinopathy"  . INGUINAL HERNIA REPAIR     [name] 07/12/2010), "don't remember which side"  .  LAPAROTOMY  08/23/2012   Procedure: EXPLORATORY LAPAROTOMY;  Surgeon: [name], DO;  Location: [facility];  Service: General;  Laterality: N/A;  exploratory laparotomy with lysis of adhesions  . LYSIS OF ADHESION  08/23/2012   Procedure: LYSIS OF ADHESION;  Surgeon: [name], DO;  Location: [facility];  Service: General;;  . ROBOT ASSISTED LAPAROSCOPIC RADICAL PROSTATECTOMY  2000's   "had to finish manually after machine broke"  . SHOULDER SURGERY  1970's   separation; from playing football"    Social History   Tobacco Use  . Smoking status: Former Smoker    Packs/day: 2.50    Years: 3.00    Pack years: 7.50    Types: Cigarettes    Last attempt to quit: 11/09/1966    Years since quitting: 51.7  . Smokeless tobacco: Never Used  Substance Use Topics  . Alcohol use: Yes    Comment: 04/08/2015 "maybe a beer/ or 2 or a glass of wine monthly"    Family History  Problem Relation Age of Onset  . Diabetes Mellitus II Mother   . Colon cancer Neg Hx     Review of Systems  Constitutional: Positive for diaphoresis. Negative for chills and fever.  Eyes: Positive for blurred vision.  Respiratory: Positive for shortness of breath. Negative for cough, sputum production and wheezing.   Cardiovascular: Positive for orthopnea, leg swelling and PND. Negative for chest pain and palpitations.  Gastrointestinal: Negative for abdominal pain, constipation, diarrhea, nausea and vomiting.  Genitourinary: Negative for dysuria, frequency and urgency.  Neurological: Positive for dizziness and tingling. Negative for focal weakness.  Psychiatric/Behavioral: Negative for depression. The patient is not nervous/anxious.   All other systems reviewed and are negative.  Per hpi  OBJECTIVE:  Blood pressure (!) 176/73, pulse 69, temperature 98.6 F (37 C), temperature source Oral, height '5\' 6"'$  (1.676 m), weight 178 lb 6.4 oz (80.9 kg), SpO2 100 %. Body mass index is 28.79 kg/m.   Physical Exam  Constitutional:  He is oriented to person, place, and time. He appears well-developed and well-nourished.  HENT:  Head: Normocephalic and atraumatic.  Mouth/Throat: Oropharynx is clear and moist.  Eyes: Pupils are equal, round, and reactive to light. Conjunctivae and EOM are normal.  Neck: Neck supple.  Cardiovascular: Normal rate and regular rhythm. Exam reveals no gallop and no friction rub.  No murmur heard. Pulmonary/Chest: Effort normal and breath sounds normal. He has no wheezes. He has no rales.  Musculoskeletal: He exhibits edema.  Neurological: He is alert and oriented to person, place, and time.  Skin: Skin is warm and dry.  Psychiatric: He has a normal mood and affect.  Nursing note and vitals reviewed.   ASSESSMENT and PLAN  1. Type 2 diabetes mellitus with hyperglycemia, with long-term current use of insulin (HCC)  Per last a1c uncontrolled, having issues with compliance. Changing lantus to toujeo to allow one time dosing, less risk of hypoglycemia. Adding GLP1 for mgt of parandial cbgs, again once a week. Trying to simplify regime. Discussed meds new r/se/b - Lipid panel - TSH - CMP14+EGFR - Hemoglobin A1c - Ambulatory referral to Podiatry  2. Special screening for malignant neoplasms, colon - Ambulatory referral to Gastroenterology  3. Essential hypertension Uncontrolled. Increasing losartan from 86m to 1048m Discussed importance of low salt diet. Has upcoming appt with cards. RTC precautions given  4. HYPERLIPIDEMIA, MIXED Last LDL above goal of 100. Increasing atorvastatin. Checking baseline today.  Other orders - Lancets 30G MISC - gabapentin (NEURONTIN) 300 MG capsule; Take 1 capsule (300 mg total) by mouth 3 (three) times daily. - losartan (COZAAR) 100 MG tablet; Take 1 tablet (100 mg total) by mouth daily. - chlorthalidone (HYGROTON) 25 MG tablet; Take 1 tablet (25 mg total) by mouth daily. - atorvastatin (LIPITOR) 40 MG tablet; Take 0.5 tablets (20 mg total) by mouth  daily. - blood glucose meter kit and supplies KIT; per insurance preference. Check blood glucose three times a day. Dx E11.65, Z79.4 - Dulaglutide (TRULICITY) 0.9.31GPE/1.6KOOPN; Inject 0.75 mg into the skin once a week. - Insulin Glargine, 2 Unit Dial, (TOUJEO MAX SOLOSTAR) 300 UNIT/ML SOPN; Inject 50 Units into the skin daily.    Return in about 4 weeks (around 08/15/2018) for bp and glucose readings.    IrRutherford GuysMD [facility] [id number].  [phone]

## 2018-07-20 LAB — CMP14+EGFR
ALT: 33 IU/L (ref 0–44)
AST: 33 IU/L (ref 0–40)
Albumin/Globulin Ratio: 1.3 (ref 1.2–2.2)
Albumin: 4.3 g/dL (ref 3.6–4.8)
Alkaline Phosphatase: 138 IU/L — ABNORMAL HIGH (ref 39–117)
BUN/Creatinine Ratio: 23 (ref 10–24)
BUN: 38 mg/dL — ABNORMAL HIGH (ref 8–27)
Bilirubin Total: 0.6 mg/dL (ref 0.0–1.2)
CO2: 21 mmol/L (ref 20–29)
Calcium: 10.2 mg/dL (ref 8.6–10.2)
Chloride: 98 mmol/L (ref 96–106)
Creatinine, Ser: 1.64 mg/dL — ABNORMAL HIGH (ref 0.76–1.27)
GFR calc Af Amer: 49 mL/min/{1.73_m2} — ABNORMAL LOW (ref >59)
GFR calc non Af Amer: 42 mL/min/{1.73_m2} — ABNORMAL LOW (ref >59)
Globulin, Total: 3.3 g/dL (ref 1.5–4.5)
Glucose: 130 mg/dL — ABNORMAL HIGH (ref 65–99)
Potassium: 4.4 mmol/L (ref 3.5–5.2)
Sodium: 141 mmol/L (ref 134–144)
Total Protein: 7.6 g/dL (ref 6.0–8.5)

## 2018-07-20 LAB — TSH: TSH: 0.902 u[IU]/mL (ref 0.450–4.500)

## 2018-07-20 LAB — LIPID PANEL
Chol/HDL Ratio: 3 ratio (ref 0.0–5.0)
Cholesterol, Total: 155 mg/dL (ref 100–199)
HDL: 52 mg/dL (ref >39)
LDL Calculated: 76 mg/dL (ref 0–99)
Triglycerides: 134 mg/dL (ref 0–149)
VLDL Cholesterol Cal: 27 mg/dL (ref 5–40)

## 2018-07-20 LAB — HEMOGLOBIN A1C
Est. average glucose Bld gHb Est-mCnc: 295 mg/dL
Hgb A1c MFr Bld: 11.9 % — ABNORMAL HIGH (ref 4.8–5.6)

## 2018-08-06 ENCOUNTER — Encounter: Payer: Self-pay | Admitting: Family Medicine

## 2018-08-09 ENCOUNTER — Ambulatory Visit: Payer: Medicare Other | Admitting: Podiatry

## 2018-08-09 ENCOUNTER — Encounter: Payer: Self-pay | Admitting: Podiatry

## 2018-08-09 VITALS — BP 169/79

## 2018-08-09 DIAGNOSIS — M2141 Flat foot [pes planus] (acquired), right foot: Secondary | ICD-10-CM | POA: Diagnosis not present

## 2018-08-09 DIAGNOSIS — L84 Corns and callosities: Secondary | ICD-10-CM

## 2018-08-09 DIAGNOSIS — E1142 Type 2 diabetes mellitus with diabetic polyneuropathy: Secondary | ICD-10-CM | POA: Diagnosis not present

## 2018-08-09 DIAGNOSIS — M79675 Pain in left toe(s): Secondary | ICD-10-CM

## 2018-08-09 DIAGNOSIS — M2142 Flat foot [pes planus] (acquired), left foot: Secondary | ICD-10-CM

## 2018-08-09 DIAGNOSIS — B351 Tinea unguium: Secondary | ICD-10-CM

## 2018-08-09 DIAGNOSIS — M79674 Pain in right toe(s): Secondary | ICD-10-CM | POA: Diagnosis not present

## 2018-08-09 NOTE — Progress Notes (Signed)
Subjective: [name] presents today referred by [name], MD with history of diabetes and diabetic neuropathy.[name]  He presents with cc of painful, mycotic toenails.  Pain is aggravated when wearing enclosed shoe gear and relieved with periodic professional debridement.  [name] relates his wife noticed his left 4th toenail was loose and she has been applying antibiotic ointment and bandaid daily. He is unsure how/when the nail became loose.  Patient has peripheral neuropathy managed with gabapentin.  Past Medical History:  Diagnosis Date  . Diabetic retinopathy ([place])   . Diabetic retinopathy associated with type 2 diabetes mellitus ([place]) 09/13/2014   He works for [employer]  . Fluid overload 09/03/2012   Post op  . GERD (gastroesophageal reflux disease)   . Prostate cancer ([place])   . Visual impairment     Patient Active Problem List   Diagnosis Date Noted  . Adrenal insufficiency ([place]) 04/09/2015  . Peripheral edema 09/17/2014  . DOE (dyspnea on exertion) 09/17/2014  . Bronchospasm, acute 09/16/2014  . Partial small bowel obstruction ([place]) 09/16/2014  . Type 2 diabetes mellitus with hyperglycemia ([place])   . Diabetic retinopathy associated with type 2 diabetes mellitus ([place]) 09/13/2014  . Abdominal pain 09/13/2014  . Cough 11/15/2012  . Pharyngitis 09/14/2012  . Fluid overload 09/03/2012  . Ileus following gastrointestinal surgery ([place]) 09/03/2012  . Hypokalemia 08/30/2012  . Small bowel obstruction s/p LOA [id number] 08/21/2012  . Abnormal EKG 08/21/2012  . Chest pain 08/21/2012  . Macular degeneration (senile) of retina 12/15/2011  . Lens replaced 12/15/2011  . Vitreous hemorrhage ([place]) 12/15/2011  . Essential hypertension 08/19/2008  . Regional enteritis of small intestine ([place]) 01/03/2008  . ERECTILE DYSFUNCTION, ORGANIC 01/03/2008  . Cellulitis and abscess of face 10/11/2007  . Sarcoidosis 07/19/2007  . Diabetes mellitus ([place]) 07/19/2007  .  HYPERLIPIDEMIA, MIXED 07/19/2007  . CERUMEN IMPACTION, BILATERAL 07/19/2007  . URINARY INCONTINENCE, STRESS, MALE 08/17/2006  . PROSTATE CANCER, HX OF 08/17/2006  . DIABETIC  RETINOPATHY 01/09/2002    Past Surgical History:  Procedure Laterality Date  . CATARACT EXTRACTION W/ INTRAOCULAR LENS  IMPLANT, BILATERAL    . EYE SURGERY Bilateral    "laser OR for diabetic retinopathy"  . INGUINAL HERNIA REPAIR     [name] 07/12/2010), "don't remember which side"  . LAPAROTOMY  08/23/2012   Procedure: EXPLORATORY LAPAROTOMY;  Surgeon: [name], DO;  Location[facility];  Service: General;  Laterality: N/A;  exploratory laparotomy with lysis of adhesions  . LYSIS OF ADHESION  08/23/2012   Procedure: LYSIS OF ADHESION;  Surgeon: [name], DO;  Location[facility];  Service: General;;  . ROBOT ASSISTED LAPAROSCOPIC RADICAL PROSTATECTOMY  2000's   "had to finish manually after machine broke"  . SHOULDER SURGERY  1970's   separation; from playing football"     Current Outpatient Medications:  .  aspirin 81 MG chewable tablet, Chew 81 mg by mouth daily., Disp: , Rfl:  .  atorvastatin (LIPITOR) 40 MG tablet, Take 0.5 tablets (20 mg total) by mouth daily., Disp: 90 tablet, Rfl: 3 .  blood glucose meter kit and supplies KIT, per insurance preference. Check blood glucose three times a day. Dx E11.65, Z79.4, Disp: 1 each, Rfl: 11 .  chlorthalidone (HYGROTON) 25 MG tablet, Take 1 tablet (25 mg total) by mouth daily., Disp: 90 tablet, Rfl: 1 .  ciprofloxacin (CILOXAN) 0.3 % ophthalmic solution, As directed, Disp: , Rfl:  .  Dulaglutide (TRULICITY) 7.03 JK/0.9FG SOPN, Inject 0.75  mg into the skin once a week., Disp: 4 pen, Rfl: 3 .  gabapentin (NEURONTIN) 300 MG capsule, Take 1 capsule (300 mg total) by mouth 3 (three) times daily., Disp: 90 capsule, Rfl: 3 .  Insulin Glargine, 2 Unit Dial, (TOUJEO MAX SOLOSTAR) 300 UNIT/ML SOPN, Inject 50 Units into the skin daily., Disp: 6 mL, Rfl: 2 .  Lancets (FREESTYLE)  lancets, Use as instructed, Disp: 100 each, Rfl: 0 .  Lancets 30G MISC, , Disp: , Rfl:  .  losartan (COZAAR) 100 MG tablet, Take 1 tablet (100 mg total) by mouth daily., Disp: 90 tablet, Rfl: 1 .  Multiple Vitamin (MULTIVITAMIN WITH MINERALS) TABS tablet, Take 1 tablet by mouth daily., Disp: , Rfl:  .  pantoprazole (PROTONIX) 40 MG tablet, Take 30- 60 min before your first and last meals of the day, Disp: 60 tablet, Rfl: 2  Allergies  Allergen Reactions  . Ace Inhibitors Itching and Cough  . Naproxen Anaphylaxis    REACTION: Throat swells and cannot breathe    Social History   Occupational History  . Not on file  Tobacco Use  . Smoking status: Former Smoker    Packs/day: 2.50    Years: 3.00    Pack years: 7.50    Types: Cigarettes    Last attempt to quit: 11/09/1966    Years since quitting: 51.7  . Smokeless tobacco: Never Used  Substance and Sexual Activity  . Alcohol use: Yes    Comment: 04/08/2015 "maybe a beer/ or 2 or a glass of wine monthly"  . Drug use: No  . Sexual activity: Yes    Family History  Problem Relation Age of Onset  . Diabetes Mellitus II Mother   . Colon cancer Neg Hx     Immunization History  Administered Date(s) Administered  . H1N1 08/19/2008  . Influenza Split 08/22/2012  . Influenza Whole 07/19/2007, 08/19/2008  . Influenza,inj,Quad PF,6+ Mos 05/19/2016  . Influenza,inj,quad, With Preservative 05/31/2013  . Influenza-Unspecified 08/19/2008, 08/22/2012, 05/31/2013, 05/19/2016  . Pneumococcal Conjugate-13 11/05/2013, 11/05/2013  . Pneumococcal Polysaccharide-23 07/19/2007, 03/08/2016, 03/08/2016  . Td 08/15/2001  . Tdap 08/15/2001  . Tetanus 08/15/2001    Review of systems: Positive Findings in bold print.  Constitutional:  chills, fatigue, fever, sweats, weight change Communication: Optometrist, sign Ecologist, hand writing, iPad/Android device Eyes: diplopia, glare,  light sensitivity, eyeglasses, blindness, vision  problems Ears nose mouth throat: Hard of hearing, deaf, sign language,  vertigo,   bloody nose,  rhinitis,  cold sores, snoring Cardiovascular: HTN, edema, arrhythmia, pacemaker in place, defibrillator in place,  chest pain/tightness, chronic anticoagulation, blood clot Respiratory:  difficulty breathing, denies congestion, SOB, wheezing, cough Gastrointestinal: abdominal pain, diarrhea, nausea, vomiting,  Genitourinary:  nocturia,  pain on urination,  blood in urine, Foley catheter, urinary urgency Musculoskeletal: Uses mobility aid,  cramping, stiff joints, painful joints,  Skin: +changes in toenails, color change dryness, itchy skin, mole changes, or rash  Neurological: numbness, paresthesias, burning in feet, denies fainting,  seizure, change in speech. denies headaches, memory problems/poor historian, cerebral palsy Endocrine: diabetes, hypothyroidism, hyperthyroidism,  dry mouth, flushing, denies heat intolerance,  cold intolerance,  excessive thirst, denies polyuria,  nocturia Hematological:  easy bleeding,  excessive bleeding, easy bruising, enlarged lymph nodes, on long term blood thinner Allergy/immunological:  hives, frequent infections, multiple drug allergies, seasonal allergies,  Psychiatric:  anxiety, depression, mood disorder, suicidal ideations, hallucinations   Objective:  Vascular Examination: Capillary refill time immediate x 10 digits  Dorsalis pedis faintly palpable  b/l  Posterior tibial pulses nonpalpable b/l  Digital hair x 10 digits was absent  Skin temperature WNL b/l  +1 pitting edema BLE left >right  Dermatological Examination: Skin with venous stasis skin changes b/l LE  Toenails 1-5 b/l discolored, thick, dystrophic with subungual debris and pain with palpation to nailbeds due to thickness of nails.  Hyperkeratotic lesion submet head 5 b/l and submet head 1 left foot  Musculoskeletal:  Muscle strength 5/5 to all muscle groups b/l  Brachymetatarsia  b/l 4th metatarsals  Pes planus foot type b/l  Neurological: Sensation with 10 gram monofilament absent b/l  Vibratory sensation intact b/l  Assessment: 1. Painful onychomycosis toenails 1-5 b/l 2. Calluses submet head 5 b/l and submet head 1 left foot 3. Pes planus foot type b/l 4. NIDDM with diabetic neuropathy  Plan: 1. Discussed diabetic foot care principles. Literature dispensed to patient on today. 2. Toenails 1-5 b/l were debrided in length and girth without iatrogenic bleeding. 3. Calluses submet head 5 b/l and submet head 1 left pared without incident 4. Will verify diabetic shoe benefits. Supporting diagnoses are: NIDDM with neuropathy, calluses submet head 5 b/l, submet head 1 left foot, brachymetatarsia 4th metatarsal heads b/l, pes planus foot type b/l 5. Patient to continue soft, supportive shoe gear 6. Patient to report any pedal injuries to medical professional  7. Follow up 3 months. Patient/POA to call should there be a concern in the interim.

## 2018-08-09 NOTE — Patient Instructions (Addendum)
Diabetes Mellitus and Foot Care Foot care is an important part of your health, especially when you have diabetes. Diabetes may cause you to have problems because of poor blood flow (circulation) to your feet and legs, which can cause your skin to:  Become thinner and drier.  Break more easily.  Heal more slowly.  Peel and crack. You may also have nerve damage (neuropathy) in your legs and feet, causing decreased feeling in them. This means that you may not notice minor injuries to your feet that could lead to more serious problems. Noticing and addressing any potential problems early is the best way to prevent future foot problems. How to care for your feet Foot hygiene  Wash your feet daily with warm water and mild soap. Do not use hot water. Then, pat your feet and the areas between your toes until they are completely dry. Do not soak your feet as this can dry your skin.  Trim your toenails straight across. Do not dig under them or around the cuticle. File the edges of your nails with an emery board or nail file.  Apply a moisturizing lotion or petroleum jelly to the skin on your feet and to dry, brittle toenails. Use lotion that does not contain alcohol and is unscented. Do not apply lotion between your toes. Shoes and socks  Wear clean socks or stockings every day. Make sure they are not too tight. Do not wear knee-high stockings since they may decrease blood flow to your legs.  Wear shoes that fit properly and have enough cushioning. Always look in your shoes before you put them on to be sure there are no objects inside.  To break in new shoes, wear them for just a few hours a day. This prevents injuries on your feet. Wounds, scrapes, corns, and calluses  Check your feet daily for blisters, cuts, bruises, sores, and redness. If you cannot see the bottom of your feet, use a mirror or ask someone for help.  Do not cut corns or calluses or try to remove them with medicine.  If you  find a minor scrape, cut, or break in the skin on your feet, keep it and the skin around it clean and dry. You may clean these areas with mild soap and water. Do not clean the area with peroxide, alcohol, or iodine.  If you have a wound, scrape, corn, or callus on your foot, look at it several times a day to make sure it is healing and not infected. Check for: ? Redness, swelling, or pain. ? Fluid or blood. ? Warmth. ? Pus or a bad smell. General instructions  Do not cross your legs. This may decrease blood flow to your feet.  Do not use heating pads or hot water bottles on your feet. They may burn your skin. If you have lost feeling in your feet or legs, you may not know this is happening until it is too late.  Protect your feet from hot and cold by wearing shoes, such as at the beach or on hot pavement.  Schedule a complete foot exam at least once a year (annually) or more often if you have foot problems. If you have foot problems, report any cuts, sores, or bruises to your health care provider immediately. Contact a health care provider if:  You have a medical condition that increases your risk of infection and you have any cuts, sores, or bruises on your feet.  You have an injury that is not   healing.  You have redness on your legs or feet.  You feel burning or tingling in your legs or feet.  You have pain or cramps in your legs and feet.  Your legs or feet are numb.  Your feet always feel cold.  You have pain around a toenail. Get help right away if:  You have a wound, scrape, corn, or callus on your foot and: ? You have pain, swelling, or redness that gets worse. ? You have fluid or blood coming from the wound, scrape, corn, or callus. ? Your wound, scrape, corn, or callus feels warm to the touch. ? You have pus or a bad smell coming from the wound, scrape, corn, or callus. ? You have a fever. ? You have a red line going up your leg. Summary  Check your feet every day  for cuts, sores, red spots, swelling, and blisters.  Moisturize feet and legs daily.  Wear shoes that fit properly and have enough cushioning.  If you have foot problems, report any cuts, sores, or bruises to your health care provider immediately.  Schedule a complete foot exam at least once a year (annually) or more often if you have foot problems. This information is not intended to replace advice given to you by your health care provider. Make sure you discuss any questions you have with your health care provider. Document Released: 07/29/2000 Document Revised: 09/13/2017 Document Reviewed: 09/02/2016 Else[employer] Interactive Patient Education  2019 [employer]  Diabetic Neuropathy Diabetic neuropathy refers to nerve damage that is caused by diabetes (diabetes mellitus). Over time, people with diabetes can develop nerve damage throughout the body. There are several types of diabetic neuropathy:  Peripheral neuropathy. This is the most common type of diabetic neuropathy. It causes damage to nerves that carry signals between the spinal cord and other parts of the body (peripheral nerves). This usually affects nerves in the feet and legs first, and may eventually affect the hands and arms. The damage affects the ability to sense touch or temperature.  Autonomic neuropathy. This type causes damage to nerves that control involuntary functions (autonomic nerves). These nerves carry signals that control: ? Heartbeat. ? Body temperature. ? Blood pressure. ? Urination. ? Digestion. ? Sweating. ? Sexual function. ? Response to changing blood sugar (glucose) levels.  Focal neuropathy. This type of nerve damage affects one area of the body, such as an arm, a leg, or the face. The injury may involve one nerve or a small group of nerves. Focal neuropathy can be painful and unpredictable, and occurs most often in older adults with diabetes. This often develops suddenly, but usually improves over time  and does not cause long-term problems.  Proximal neuropathy. This type of nerve damage affects the nerves of the thighs, hips, buttocks, or legs. It causes severe pain, weakness, and muscle death (atrophy), usually in the thigh muscles. It is more common among older men and people who have type 2 diabetes. The length of recovery time may vary. What are the causes? Peripheral, autonomic, and focal neuropathies are caused by diabetes that is not well controlled with treatment. The cause of proximal neuropathy is not known, but it may be caused by inflammation related to uncontrolled blood glucose levels. What are the signs or symptoms? Peripheral neuropathy Peripheral neuropathy develops slowly over time. When the nerves of the feet and legs no longer work, you may experience:  Burning, stabbing, or aching pain in the legs or feet.  Pain or cramping in the  legs or feet.  Loss of feeling (numbness) and inability to feel pressure or pain in the feet. This can lead to: ? Thick calluses or sores on areas of constant pressure. ? Ulcers. ? Reduced ability to feel temperature changes.  Foot deformities.  Muscle weakness.  Loss of balance or coordination. Autonomic neuropathy The symptoms of autonomic neuropathy vary depending on which nerves are affected. Symptoms may include:  Problems with digestion, such as: ? Nausea or vomiting. ? Poor appetite. ? Bloating. ? Diarrhea or constipation. ? Trouble swallowing. ? Losing weight without trying to.  Problems with the heart, blood and lungs, such as: ? Dizziness, especially when standing up. ? Fainting. ? Shortness of breath. ? Irregular heartbeat.  Bladder problems, such as: ? Trouble starting or stopping urination. ? Leaking urine. ? Trouble emptying the bladder. ? Urinary tract infections (UTIs).  Problems with other body functions, such as: ? Sweat. You may sweat too much or too little. ? Temperature. You might get hot easily.  Or, you might feel cold more than usual. ? Sexual function. Men may not be able to get or maintain an erection. Women may have vaginal dryness and difficulty with arousal. Focal neuropathy Symptoms affect only one area of the body. Common symptoms include:  Numbness.  Tingling.  Burning pain.  Prickling feeling.  Very sensitive skin.  Weakness.  Inability to move (paralysis).  Muscle twitching.  Muscles getting smaller (wasting).  Poor coordination.  Double or blurred vision. Proximal neuropathy  Sudden, severe pain in the hip, thigh, or buttocks. Pain may spread from the back into the legs (sciatica).  Pain and numbness in the arms and legs.  Tingling.  Loss of bladder control or bowel control.  Weakness and wasting of thigh muscles.  Difficulty getting up from a seated position.  Abdominal swelling.  Unexplained weight loss. How is this diagnosed? Diagnosis usually involves reviewing your medical history and any symptoms you have. Diagnosis varies depending on the type of neuropathy your health care provider suspects. Peripheral neuropathy Your health care provider will check areas that are affected by your nervous system (neurologic exam), such as your reflexes, how you move, and what you can feel. You may have other tests, such as:  Blood tests.  Removal and examination of fluid that surrounds the spinal cord (lumbar puncture).  CT scan.  MRI.  A test to check the nerves that control muscles (electromyogram, EMG).  Tests of how quickly messages pass through your nerves (nerve conduction velocity tests).  Removal of a small piece of nerve to be examined under a microscope (biopsy). Autonomic neuropathy You may have tests, such as:  Tests to measure your blood pressure and heart rate. This may include monitoring you while you are safely secured to an exam table that moves you from a lying position to an upright position (table tilt test).  Breathing  tests to check your lungs.  Tests to check how food moves through the digestive system (gastric emptying tests).  Blood, sweat, or urine tests.  Ultrasound of your bladder.  Spinal fluid tests. Focal neuropathy This condition may be diagnosed with:  A neurologic exam.  CT scan.  MRI.  EMG.  Nerve conduction velocity tests. Proximal neuropathy There is no test to diagnose this type of neuropathy. You may have tests to rule out other possible causes of this type of neuropathy. Tests may include:  X-rays of your spine and lumbar region.  Lumbar puncture.  MRI. How is this treated? The  goal of treatment is to keep nerve damage from getting worse. The most important part of treatment is keeping your blood glucose level and your A1C level within your target range by following your diabetes management plan. Over time, maintaining lower blood glucose levels helps lessen symptoms. In some cases, you may need prescription pain medicine. Follow these instructions at home:  Lifestyle   Do not use any products that contain nicotine or tobacco, such as cigarettes and e-cigarettes. If you need help quitting, ask your health care provider.  Be physically active every day. Include strength training and balance exercises.  Follow a healthy meal plan.  Work with your health care provider to manage your blood pressure. General instructions  Follow your diabetes management plan as directed. ? Check your blood glucose levels as directed by your health care provider. ? Keep your blood glucose in your target range as directed by your health care provider. ? Have your A1C level checked at least two times a year, or as often as told by your health care provider.  Take over the counter and prescription medicines only as told by your health care provider. This includes insulin and diabetes medicine.  Do not drive or use heavy machinery while taking prescription pain medicines.  Check your  skin and feet every day for cuts, bruises, redness, blisters, or sores.  Keep all follow up visits as told by your health care provider. This is important. Contact a health care provider if:  You have burning, stabbing, or aching pain in your legs or feet.  You are unable to feel pressure or pain in your feet.  You develop problems with digestion, such as: ? Nausea. ? Vomiting. ? Bloating. ? Constipation. ? Diarrhea. ? Abdominal pain.  You have difficulty with urination, such as inability: ? To control when you urinate (incontinence). ? To completely empty the bladder (retention).  You have palpitations.  You feel dizzy, weak, or faint when you stand up. Get help right away if:  You cannot urinate.  You have sudden weakness or loss of coordination.  You have trouble speaking.  You have pain or pressure in your chest.  You have an irregular heart beat.  You have sudden inability to move a part of your body. Summary  Diabetic neuropathy refers to nerve damage that is caused by diabetes. It can affect nerves throughout the entire body, causing numbness and pain in the arms, legs, digestive tract, heart, and other body systems.  Keep your blood glucose level and your blood pressure in your target range, as directed by your health care provider. This can help prevent neuropathy from getting worse.  Check your skin and feet every day for cuts, bruises, redness, blisters, or sores.  Do not use any products that contain nicotine or tobacco, such as cigarettes and e-cigarettes. If you need help quitting, ask your health care provider. This information is not intended to replace advice given to you by your health care provider. Make sure you discuss any questions you have with your health care provider. Document Released: 10/10/2001 Document Revised: 09/13/2017 Document Reviewed: 09/05/2016 Elsevier Interactive Patient Education  2019 [employer]  Edema  Edema is when you  have too much fluid in your body or under your skin. Edema may make your legs, feet, and ankles swell up. Swelling is also common in looser tissues, like around your eyes. This is a common condition. It gets more common as you get older. There are many possible causes of  edema. Eating too much salt (sodium) and being on your feet or sitting for a long time can cause edema in your legs, feet, and ankles. Hot weather may make edema worse. Edema is usually painless. Your skin may look swollen or shiny. Follow these instructions at home:  Keep the swollen body part raised (elevated) above the level of your heart when you are sitting or lying down.  Do not sit still or stand for a long time.  Do not wear tight clothes. Do not wear garters on your upper legs.  Exercise your legs. This can help the swelling go down.  Wear elastic bandages or support stockings as told by your doctor.  Eat a low-salt (low-sodium) diet to reduce fluid as told by your doctor.  Depending on the cause of your swelling, you may need to limit how much fluid you drink (fluid restriction).  Take over-the-counter and prescription medicines only as told by your doctor. Contact a doctor if:  Treatment is not working.  You have heart, liver, or kidney disease and have symptoms of edema.  You have sudden and unexplained weight gain. Get help right away if:  You have shortness of breath or chest pain.  You cannot breathe when you lie down.  You have pain, redness, or warmth in the swollen areas.  You have heart, liver, or kidney disease and get edema all of a sudden.  You have a fever and your symptoms get worse all of a sudden. Summary  Edema is when you have too much fluid in your body or under your skin.  Edema may make your legs, feet, and ankles swell up. Swelling is also common in looser tissues, like around your eyes.  Raise (elevate) the swollen body part above the level of your heart when you are sitting  or lying down.  Follow your doctor's instructions about diet and how much fluid you can drink (fluid restriction). This information is not intended to replace advice given to you by your health care provider. Make sure you discuss any questions you have with your health care provider. Document Released: 01/18/2008 Document Revised: 08/19/2016 Document Reviewed: 08/19/2016 Elsevier Interactive Patient Education  2019 [employer].

## 2018-08-29 ENCOUNTER — Ambulatory Visit: Payer: Medicare Other | Admitting: Family Medicine

## 2018-09-05 ENCOUNTER — Encounter: Payer: Self-pay | Admitting: Podiatry

## 2018-09-05 ENCOUNTER — Ambulatory Visit: Payer: Medicare Other | Admitting: Orthotics

## 2018-09-05 DIAGNOSIS — L089 Local infection of the skin and subcutaneous tissue, unspecified: Secondary | ICD-10-CM

## 2018-09-05 DIAGNOSIS — M2142 Flat foot [pes planus] (acquired), left foot: Secondary | ICD-10-CM

## 2018-09-05 DIAGNOSIS — L84 Corns and callosities: Secondary | ICD-10-CM

## 2018-09-05 DIAGNOSIS — E1142 Type 2 diabetes mellitus with diabetic polyneuropathy: Secondary | ICD-10-CM

## 2018-09-05 DIAGNOSIS — M2141 Flat foot [pes planus] (acquired), right foot: Secondary | ICD-10-CM

## 2018-09-05 DIAGNOSIS — E11628 Type 2 diabetes mellitus with other skin complications: Secondary | ICD-10-CM

## 2018-09-27 DIAGNOSIS — N184 Chronic kidney disease, stage 4 (severe): Secondary | ICD-10-CM | POA: Insufficient documentation

## 2018-09-28 ENCOUNTER — Telehealth: Payer: Self-pay | Admitting: Family Medicine

## 2018-10-04 DIAGNOSIS — Z961 Presence of intraocular lens: Secondary | ICD-10-CM | POA: Insufficient documentation

## 2018-10-11 ENCOUNTER — Telehealth: Payer: Self-pay | Admitting: Podiatry

## 2018-10-11 NOTE — Telephone Encounter (Signed)
Pt left message yesterday returning my call to get scheduled to pick up his diabetic shoes.  I returned the call and left a message that the next available appt will be on 3.13.2020

## 2018-10-17 ENCOUNTER — Other Ambulatory Visit: Payer: Self-pay

## 2018-10-17 MED ORDER — PEN NEEDLES 32G X 6 MM MISC: 1.0000 | Freq: Every day | 12 refills | Status: DC

## 2018-10-19 ENCOUNTER — Encounter (HOSPITAL_COMMUNITY): Payer: Self-pay | Admitting: Emergency Medicine

## 2018-10-19 ENCOUNTER — Inpatient Hospital Stay (HOSPITAL_COMMUNITY)
Admission: EM | Admit: 2018-10-19 | Discharge: 2018-10-24 | DRG: 638 | Disposition: A | Discharge status: Home Health | Payer: Medicare Other | Attending: Internal Medicine | Admitting: Internal Medicine

## 2018-10-19 ENCOUNTER — Emergency Department (HOSPITAL_COMMUNITY): Payer: Medicare Other

## 2018-10-19 ENCOUNTER — Other Ambulatory Visit: Payer: Self-pay

## 2018-10-19 DIAGNOSIS — Z961 Presence of intraocular lens: Secondary | ICD-10-CM | POA: Diagnosis present

## 2018-10-19 DIAGNOSIS — I998 Other disorder of circulatory system: Secondary | ICD-10-CM | POA: Diagnosis present

## 2018-10-19 DIAGNOSIS — E119 Type 2 diabetes mellitus without complications: Secondary | ICD-10-CM

## 2018-10-19 DIAGNOSIS — N183 Chronic kidney disease, stage 3 (moderate): Secondary | ICD-10-CM | POA: Diagnosis present

## 2018-10-19 DIAGNOSIS — E11610 Type 2 diabetes mellitus with diabetic neuropathic arthropathy: Secondary | ICD-10-CM | POA: Diagnosis present

## 2018-10-19 DIAGNOSIS — R509 Fever, unspecified: Secondary | ICD-10-CM | POA: Diagnosis not present

## 2018-10-19 DIAGNOSIS — E11628 Type 2 diabetes mellitus with other skin complications: Secondary | ICD-10-CM | POA: Diagnosis present

## 2018-10-19 DIAGNOSIS — I129 Hypertensive chronic kidney disease with stage 1 through stage 4 chronic kidney disease, or unspecified chronic kidney disease: Secondary | ICD-10-CM | POA: Diagnosis present

## 2018-10-19 DIAGNOSIS — L089 Local infection of the skin and subcutaneous tissue, unspecified: Secondary | ICD-10-CM | POA: Diagnosis not present

## 2018-10-19 DIAGNOSIS — Z9842 Cataract extraction status, left eye: Secondary | ICD-10-CM

## 2018-10-19 DIAGNOSIS — Z79899 Other long term (current) drug therapy: Secondary | ICD-10-CM | POA: Diagnosis not present

## 2018-10-19 DIAGNOSIS — L97421 Non-pressure chronic ulcer of left heel and midfoot limited to breakdown of skin: Secondary | ICD-10-CM | POA: Diagnosis not present

## 2018-10-19 DIAGNOSIS — E11621 Type 2 diabetes mellitus with foot ulcer: Secondary | ICD-10-CM | POA: Diagnosis present

## 2018-10-19 DIAGNOSIS — D869 Sarcoidosis, unspecified: Secondary | ICD-10-CM | POA: Diagnosis not present

## 2018-10-19 DIAGNOSIS — Z8546 Personal history of malignant neoplasm of prostate: Secondary | ICD-10-CM

## 2018-10-19 DIAGNOSIS — L0390 Cellulitis, unspecified: Secondary | ICD-10-CM | POA: Diagnosis present

## 2018-10-19 DIAGNOSIS — M7989 Other specified soft tissue disorders: Secondary | ICD-10-CM

## 2018-10-19 DIAGNOSIS — N179 Acute kidney failure, unspecified: Secondary | ICD-10-CM | POA: Diagnosis present

## 2018-10-19 DIAGNOSIS — I10 Essential (primary) hypertension: Secondary | ICD-10-CM | POA: Diagnosis not present

## 2018-10-19 DIAGNOSIS — Z794 Long term (current) use of insulin: Secondary | ICD-10-CM

## 2018-10-19 DIAGNOSIS — E440 Moderate protein-calorie malnutrition: Secondary | ICD-10-CM | POA: Diagnosis present

## 2018-10-19 DIAGNOSIS — E1165 Type 2 diabetes mellitus with hyperglycemia: Secondary | ICD-10-CM | POA: Diagnosis present

## 2018-10-19 DIAGNOSIS — I2510 Atherosclerotic heart disease of native coronary artery without angina pectoris: Secondary | ICD-10-CM | POA: Diagnosis present

## 2018-10-19 DIAGNOSIS — E1122 Type 2 diabetes mellitus with diabetic chronic kidney disease: Secondary | ICD-10-CM | POA: Diagnosis present

## 2018-10-19 DIAGNOSIS — Z9841 Cataract extraction status, right eye: Secondary | ICD-10-CM

## 2018-10-19 DIAGNOSIS — L97509 Non-pressure chronic ulcer of other part of unspecified foot with unspecified severity: Secondary | ICD-10-CM | POA: Diagnosis not present

## 2018-10-19 DIAGNOSIS — E785 Hyperlipidemia, unspecified: Secondary | ICD-10-CM | POA: Diagnosis present

## 2018-10-19 DIAGNOSIS — R9439 Abnormal result of other cardiovascular function study: Secondary | ICD-10-CM | POA: Diagnosis present

## 2018-10-19 DIAGNOSIS — E1140 Type 2 diabetes mellitus with diabetic neuropathy, unspecified: Secondary | ICD-10-CM | POA: Diagnosis present

## 2018-10-19 DIAGNOSIS — E11319 Type 2 diabetes mellitus with unspecified diabetic retinopathy without macular edema: Secondary | ICD-10-CM | POA: Diagnosis present

## 2018-10-19 DIAGNOSIS — K219 Gastro-esophageal reflux disease without esophagitis: Secondary | ICD-10-CM | POA: Diagnosis present

## 2018-10-19 DIAGNOSIS — Z6826 Body mass index (BMI) 26.0-26.9, adult: Secondary | ICD-10-CM

## 2018-10-19 DIAGNOSIS — Z888 Allergy status to other drugs, medicaments and biological substances status: Secondary | ICD-10-CM

## 2018-10-19 DIAGNOSIS — Z9079 Acquired absence of other genital organ(s): Secondary | ICD-10-CM

## 2018-10-19 DIAGNOSIS — R739 Hyperglycemia, unspecified: Secondary | ICD-10-CM

## 2018-10-19 DIAGNOSIS — Z87891 Personal history of nicotine dependence: Secondary | ICD-10-CM

## 2018-10-19 DIAGNOSIS — L03116 Cellulitis of left lower limb: Secondary | ICD-10-CM | POA: Diagnosis present

## 2018-10-19 DIAGNOSIS — Z833 Family history of diabetes mellitus: Secondary | ICD-10-CM

## 2018-10-19 DIAGNOSIS — L538 Other specified erythematous conditions: Secondary | ICD-10-CM

## 2018-10-19 DIAGNOSIS — Z7982 Long term (current) use of aspirin: Secondary | ICD-10-CM

## 2018-10-19 DIAGNOSIS — IMO0002 Reserved for concepts with insufficient information to code with codable children: Secondary | ICD-10-CM

## 2018-10-19 LAB — CBC WITH DIFFERENTIAL/PLATELET
Abs Immature Granulocytes: 0.03 10*3/uL (ref 0.00–0.07)
Basophils Absolute: 0 10*3/uL (ref 0.0–0.1)
Basophils Relative: 0 %
Eosinophils Absolute: 0.2 10*3/uL (ref 0.0–0.5)
Eosinophils Relative: 3 %
HCT: 37.6 % — ABNORMAL LOW (ref 39.0–52.0)
Hemoglobin: 11.4 g/dL — ABNORMAL LOW (ref 13.0–17.0)
Immature Granulocytes: 0 %
Lymphocytes Relative: 9 %
Lymphs Abs: 0.7 10*3/uL (ref 0.7–4.0)
MCH: 20.1 pg — ABNORMAL LOW (ref 26.0–34.0)
MCHC: 30.3 g/dL (ref 30.0–36.0)
MCV: 66.3 fL — ABNORMAL LOW (ref 80.0–100.0)
Monocytes Absolute: 0.7 10*3/uL (ref 0.1–1.0)
Monocytes Relative: 10 %
NEUTROS PCT: 78 %
Neutro Abs: 5.6 10*3/uL (ref 1.7–7.7)
Platelets: ADEQUATE 10*3/uL (ref 150–400)
RBC: 5.67 MIL/uL (ref 4.22–5.81)
RDW: 15.6 % — AB (ref 11.5–15.5)
WBC: 7.2 10*3/uL (ref 4.0–10.5)
nRBC: 0 % (ref 0.0–0.2)

## 2018-10-19 LAB — COMPREHENSIVE METABOLIC PANEL
ALT: 21 U/L (ref 0–44)
AST: 19 U/L (ref 15–41)
Albumin: 3.5 g/dL (ref 3.5–5.0)
Alkaline Phosphatase: 109 U/L (ref 38–126)
Anion gap: 9 (ref 5–15)
BILIRUBIN TOTAL: 1 mg/dL (ref 0.3–1.2)
BUN: 37 mg/dL — ABNORMAL HIGH (ref 8–23)
CO2: 28 mmol/L (ref 22–32)
Calcium: 9.7 mg/dL (ref 8.9–10.3)
Chloride: 96 mmol/L — ABNORMAL LOW (ref 98–111)
Creatinine, Ser: 1.9 mg/dL — ABNORMAL HIGH (ref 0.61–1.24)
GFR calc Af Amer: 41 mL/min — ABNORMAL LOW (ref >60)
GFR, EST NON AFRICAN AMERICAN: 35 mL/min — AB (ref >60)
Glucose, Bld: 296 mg/dL — ABNORMAL HIGH (ref 70–99)
Potassium: 4.2 mmol/L (ref 3.5–5.1)
Sodium: 133 mmol/L — ABNORMAL LOW (ref 135–145)
Total Protein: 7.8 g/dL (ref 6.5–8.1)

## 2018-10-19 LAB — CBG MONITORING, ED: Glucose-Capillary: 306 mg/dL — ABNORMAL HIGH (ref 70–99)

## 2018-10-19 LAB — LACTIC ACID, PLASMA: Lactic Acid, Venous: 1.3 mmol/L (ref 0.5–1.9)

## 2018-10-19 LAB — GLUCOSE, CAPILLARY
GLUCOSE-CAPILLARY: 252 mg/dL — AB (ref 70–99)
Glucose-Capillary: 233 mg/dL — ABNORMAL HIGH (ref 70–99)

## 2018-10-19 LAB — PREALBUMIN: Prealbumin: 10.9 mg/dL — ABNORMAL LOW (ref 18–38)

## 2018-10-19 LAB — HEMOGLOBIN A1C
Hgb A1c MFr Bld: 13.9 % — ABNORMAL HIGH (ref 4.8–5.6)
Mean Plasma Glucose: 352.23 mg/dL

## 2018-10-19 LAB — I-STAT TROPONIN, ED: Troponin i, poc: 0.01 ng/mL (ref 0.00–0.08)

## 2018-10-19 LAB — C-REACTIVE PROTEIN: CRP: 9.9 mg/dL — ABNORMAL HIGH (ref <1.0)

## 2018-10-19 LAB — SEDIMENTATION RATE: Sed Rate: 52 mm/hr — ABNORMAL HIGH (ref 0–16)

## 2018-10-19 MED ORDER — ENOXAPARIN SODIUM 40 MG/0.4ML ~~LOC~~ SOLN
40.0000 mg | SUBCUTANEOUS | Status: DC
  Administered 2018-10-19 – 2018-10-23 (×5): 40 mg via SUBCUTANEOUS
  Filled 2018-10-19 (×5): qty 0.4

## 2018-10-19 MED ORDER — ONDANSETRON HCL 4 MG PO TABS: 4.0000 mg | ORAL_TABLET | Freq: Four times a day (QID) | ORAL | Status: DC | PRN

## 2018-10-19 MED ORDER — LOSARTAN POTASSIUM 50 MG PO TABS: 100.0000 mg | ORAL_TABLET | Freq: Every day | ORAL | Status: DC

## 2018-10-19 MED ORDER — ASPIRIN 81 MG PO CHEW
81.0000 mg | CHEWABLE_TABLET | Freq: Every day | ORAL | Status: DC
  Administered 2018-10-19 – 2018-10-24 (×6): 81 mg via ORAL
  Filled 2018-10-19 (×6): qty 1

## 2018-10-19 MED ORDER — ACETAMINOPHEN 325 MG PO TABS
650.0000 mg | ORAL_TABLET | Freq: Four times a day (QID) | ORAL | Status: DC | PRN
  Administered 2018-10-22 – 2018-10-24 (×3): 650 mg via ORAL
  Filled 2018-10-19 (×3): qty 2

## 2018-10-19 MED ORDER — PANTOPRAZOLE SODIUM 40 MG PO TBEC
40.0000 mg | DELAYED_RELEASE_TABLET | Freq: Every day | ORAL | Status: DC
  Administered 2018-10-19 – 2018-10-24 (×6): 40 mg via ORAL
  Filled 2018-10-19 (×6): qty 1

## 2018-10-19 MED ORDER — PIPERACILLIN-TAZOBACTAM 3.375 G IVPB 30 MIN
3.3750 g | Freq: Once | INTRAVENOUS | Status: AC
  Administered 2018-10-19: 3.375 g via INTRAVENOUS
  Filled 2018-10-19: qty 50

## 2018-10-19 MED ORDER — VANCOMYCIN HCL 10 G IV SOLR
1500.0000 mg | INTRAVENOUS | Status: DC
  Filled 2018-10-19: qty 1500

## 2018-10-19 MED ORDER — VANCOMYCIN HCL 10 G IV SOLR
1500.0000 mg | Freq: Once | INTRAVENOUS | Status: AC
  Administered 2018-10-19: 1500 mg via INTRAVENOUS
  Filled 2018-10-19: qty 1500

## 2018-10-19 MED ORDER — INSULIN DETEMIR 100 UNIT/ML ~~LOC~~ SOLN
10.0000 [IU] | Freq: Every day | SUBCUTANEOUS | Status: DC
  Administered 2018-10-19 – 2018-10-23 (×5): 10 [IU] via SUBCUTANEOUS
  Filled 2018-10-19 (×7): qty 0.1

## 2018-10-19 MED ORDER — INSULIN ASPART 100 UNIT/ML ~~LOC~~ SOLN
0.0000 [IU] | Freq: Every day | SUBCUTANEOUS | Status: DC
  Administered 2018-10-19 – 2018-10-21 (×2): 2 [IU] via SUBCUTANEOUS

## 2018-10-19 MED ORDER — GABAPENTIN 300 MG PO CAPS
300.0000 mg | ORAL_CAPSULE | Freq: Three times a day (TID) | ORAL | Status: DC
  Administered 2018-10-19 – 2018-10-24 (×14): 300 mg via ORAL
  Filled 2018-10-19 (×14): qty 1

## 2018-10-19 MED ORDER — ONDANSETRON HCL 4 MG/2ML IJ SOLN: 4.0000 mg | Freq: Four times a day (QID) | INTRAMUSCULAR | Status: DC | PRN

## 2018-10-19 MED ORDER — SODIUM CHLORIDE 0.9 % IV SOLN
2.0000 g | INTRAVENOUS | Status: DC
  Administered 2018-10-19 – 2018-10-22 (×4): 2 g via INTRAVENOUS
  Filled 2018-10-19 (×4): qty 20

## 2018-10-19 MED ORDER — ATORVASTATIN CALCIUM 10 MG PO TABS
20.0000 mg | ORAL_TABLET | Freq: Every day | ORAL | Status: DC
  Administered 2018-10-19 – 2018-10-23 (×5): 20 mg via ORAL
  Filled 2018-10-19 (×5): qty 2

## 2018-10-19 MED ORDER — HYDRALAZINE HCL 10 MG PO TABS: 10.0000 mg | ORAL_TABLET | Freq: Three times a day (TID) | ORAL | Status: DC | PRN

## 2018-10-19 MED ORDER — OXYCODONE HCL 5 MG PO TABS
5.0000 mg | ORAL_TABLET | ORAL | Status: DC | PRN
  Administered 2018-10-20 – 2018-10-24 (×11): 5 mg via ORAL
  Filled 2018-10-19 (×11): qty 1

## 2018-10-19 MED ORDER — ACETAMINOPHEN 325 MG PO TABS
650.0000 mg | ORAL_TABLET | Freq: Once | ORAL | Status: AC
  Administered 2018-10-19: 650 mg via ORAL
  Filled 2018-10-19: qty 2

## 2018-10-19 MED ORDER — ACETAMINOPHEN 650 MG RE SUPP: 650.0000 mg | Freq: Four times a day (QID) | RECTAL | Status: DC | PRN

## 2018-10-19 MED ORDER — INSULIN ASPART 100 UNIT/ML ~~LOC~~ SOLN
0.0000 [IU] | Freq: Three times a day (TID) | SUBCUTANEOUS | Status: DC
  Administered 2018-10-20 – 2018-10-21 (×3): 2 [IU] via SUBCUTANEOUS
  Administered 2018-10-21 – 2018-10-22 (×3): 3 [IU] via SUBCUTANEOUS
  Administered 2018-10-22: 2 [IU] via SUBCUTANEOUS
  Administered 2018-10-23: 5 [IU] via SUBCUTANEOUS
  Administered 2018-10-23: 3 [IU] via SUBCUTANEOUS
  Administered 2018-10-24: 2 [IU] via SUBCUTANEOUS

## 2018-10-19 MED ORDER — METRONIDAZOLE 500 MG PO TABS
500.0000 mg | ORAL_TABLET | Freq: Three times a day (TID) | ORAL | Status: DC
  Administered 2018-10-19 – 2018-10-23 (×11): 500 mg via ORAL
  Filled 2018-10-19 (×11): qty 1

## 2018-10-19 MED ORDER — SODIUM CHLORIDE 0.9 % IV BOLUS
500.0000 mL | Freq: Once | INTRAVENOUS | Status: AC
  Administered 2018-10-19: 500 mL via INTRAVENOUS

## 2018-10-19 MED ORDER — PIPERACILLIN-TAZOBACTAM 3.375 G IVPB
3.3750 g | Freq: Three times a day (TID) | INTRAVENOUS | Status: DC
  Filled 2018-10-19: qty 50

## 2018-10-19 MED ORDER — SODIUM CHLORIDE 0.9 % IV SOLN
INTRAVENOUS | Status: AC
  Administered 2018-10-19: 18:00:00 via INTRAVENOUS

## 2018-10-19 MED ORDER — SODIUM CHLORIDE 0.9% FLUSH
3.0000 mL | Freq: Two times a day (BID) | INTRAVENOUS | Status: DC
  Administered 2018-10-19 – 2018-10-24 (×8): 3 mL via INTRAVENOUS

## 2018-10-19 NOTE — ED Notes (Signed)
ED TO INPATIENT HANDOFF REPORT  ED Nurse Name and Phone #: Yarden Manuelito [phone]  S Name/Age/Gender [name] 68 y.o. male Room/Bed: 003C/003C  Code Status   Code Status: Full Code  Home/SNF/Other Home Patient oriented to: self, place, time and situation Is this baseline? Yes   Triage Complete: Triage complete  Chief Complaint flu like sym  Triage Note Pt here with chills and body aches since Sunday.  Pt\'s wife gave him 650 of Tylenol last PM.  Pt denies N/V/D and cough.  Also c/o left foot pain x2-3 weeks.    Allergies Allergies  Allergen Reactions  . Ace Inhibitors Itching and Cough  . Naproxen Anaphylaxis    REACTION: Throat swells and cannot breathe Other reaction(s): gi distress    Level of Care/Admitting Diagnosis ED Disposition    ED Disposition Condition Comment   Admit  Hospital Area: [facility] [[id number]]  Level of Care: Med-Surg [16]  Diagnosis: Diabetic foot ulcer (HCC) [316146]  Admitting Physician: MULLEN, EMILY B [[id number]]  Attending Physician: MULLEN, EMILY B [[id number]]  Estimated length of stay: 3 - 4 days  Certification:: I certify this patient will need inpatient services for at least 2 midnights  PT Class (Do Not Modify): Inpatient [101]  PT Acc Code (Do Not Modify): Private [1]       B Medical/Surgery History Past Medical History:  Diagnosis Date  . Diabetic retinopathy (HCC)   . Diabetic retinopathy associated with type 2 diabetes mellitus (HCC) 09/13/2014   He works for [employer]  . Fluid overload 09/03/2012   Post op  . GERD (gastroesophageal reflux disease)   . Prostate cancer (HCC)   . Visual impairment    Past Surgical History:  Procedure Laterality Date  . CATARACT EXTRACTION W/ INTRAOCULAR LENS  IMPLANT, BILATERAL    . EYE SURGERY Bilateral    "laser OR for diabetic retinopathy"  . INGUINAL HERNIA REPAIR     /notes 07/12/2010), "don\'t remember which side"  . LAPAROTOMY  08/23/2012   Procedure:  EXPLORATORY LAPAROTOMY;  Surgeon: Brian Layton, DO;  Location: [facility];  Service: General;  Laterality: N/A;  exploratory laparotomy with lysis of adhesions  . LYSIS OF ADHESION  08/23/2012   Procedure: LYSIS OF ADHESION;  Surgeon: Brian Layton, DO;  Location: [facility];  Service: General;;  . ROBOT ASSISTED LAPAROSCOPIC RADICAL PROSTATECTOMY  2000\'s   "had to finish manually after machine broke"  . SHOULDER SURGERY  1970\'s   separation; from playing football"     A IV Location/Drains/Wounds Patient Lines/Drains/Airways Status   Active Line/Drains/Airways    Name:   Placement date:   Placement time:   Site:   Days:   Peripheral IV 10/19/18 Left;Posterior Forearm   10/19/18    1400    Forearm   less than 1   Incision 08/23/12 Abdomen Other (Comment)   08/23/12    1037     22 48          Intake/Output Last 24 hours No intake or output data in the 24 hours ending 10/19/18 1630  Labs/Imaging Results for orders placed or performed during the hospital encounter of 10/19/18 (from the past 48 hour(s))  CBC with Differential     Status: Abnormal   Collection Time: 10/19/18 12:42 PM  Result Value Ref Range   WBC 7.2 4.0 - 10.5 K/uL   RBC 5.67 4.22 - 5.81 MIL/uL   Hemoglobin 11.4 (L) 13.0 - 17.0 g/dL   HCT 37.6 (L)  39.0 - 52.0 %   MCV 66.3 (L) 80.0 - 100.0 fL   MCH 20.1 (L) 26.0 - 34.0 pg   MCHC 30.3 30.0 - 36.0 g/dL   RDW 15.6 (H) 11.5 - 15.5 %   Platelets  150 - 400 K/uL    PLATELET CLUMPS NOTED ON SMEAR, COUNT APPEARS ADEQUATE    Comment: REPEATED TO VERIFY   nRBC 0.0 0.0 - 0.2 %   Neutrophils Relative % 78 %   Neutro Abs 5.6 1.7 - 7.7 K/uL   Lymphocytes Relative 9 %   Lymphs Abs 0.7 0.7 - 4.0 K/uL   Monocytes Relative 10 %   Monocytes Absolute 0.7 0.1 - 1.0 K/uL   Eosinophils Relative 3 %   Eosinophils Absolute 0.2 0.0 - 0.5 K/uL   Basophils Relative 0 %   Basophils Absolute 0.0 0.0 - 0.1 K/uL   Immature Granulocytes 0 %   Abs Immature Granulocytes 0.03 0.00 - 0.07 K/uL     Comment: Performed at [facility], [address] [place], [place] [address]  Comprehensive metabolic panel     Status: Abnormal   Collection Time: 10/19/18 12:42 PM  Result Value Ref Range   Sodium 133 (L) 135 - 145 mmol/L   Potassium 4.2 3.5 - 5.1 mmol/L   Chloride 96 (L) 98 - 111 mmol/L   CO2 28 22 - 32 mmol/L   Glucose, Bld 296 (H) 70 - 99 mg/dL   BUN 37 (H) 8 - 23 mg/dL   Creatinine, Ser 1.90 (H) 0.61 - 1.24 mg/dL   Calcium 9.7 8.9 - 10.3 mg/dL   Total Protein 7.8 6.5 - 8.1 g/dL   Albumin 3.5 3.5 - 5.0 g/dL   AST 19 15 - 41 U/L   ALT 21 0 - 44 U/L   Alkaline Phosphatase 109 38 - 126 U/L   Total Bilirubin 1.0 0.3 - 1.2 mg/dL   GFR calc non Af Amer 35 (L) >60 mL/min   GFR calc Af Amer 41 (L) >60 mL/min   Anion gap 9 5 - 15    Comment: Performed at [facility] [address] [place], [place] [address]  Lactic acid, plasma     Status: None   Collection Time: 10/19/18 12:42 PM  Result Value Ref Range   Lactic Acid, Venous 1.3 0.5 - 1.9 mmol/L    Comment: Performed at [facility] [address] [place], [place] [address]  I-Stat Troponin, ED (not at [facility])     Status: None   Collection Time: 10/19/18  1:30 PM  Result Value Ref Range   Troponin i, poc 0.01 0.00 - 0.08 ng/mL   Comment 3            Comment: Due to the release kinetics of cTnI, a negative result within the first hours of the onset of symptoms does not rule out myocardial infarction with certainty. If myocardial infarction is still suspected, repeat the test at appropriate intervals.   CBG monitoring, ED     Status: Abnormal   Collection Time: 10/19/18  1:45 PM  Result Value Ref Range   Glucose-Capillary 306 (H) 70 - 99 mg/dL   Dg Chest 2 View  Result Date: 10/19/2018 CLINICAL DATA:  Chills, fever EXAM: CHEST - 2 VIEW COMPARISON:  02/09/2018 chest radiograph. FINDINGS: Stable cardiomediastinal silhouette with normal heart size. No pneumothorax. No pleural effusion. Lungs appear clear,  with no acute consolidative airspace disease and no pulmonary edema. IMPRESSION: No  active cardiopulmonary disease. Electronically Signed   By: Ilona Sorrel M.D.   On: 10/19/2018 13:39   Dg Foot Complete Left  Result Date: 10/19/2018 CLINICAL DATA:  Open sore lateral left foot.  Chills.  Fever. EXAM: LEFT FOOT - COMPLETE 3+ VIEW COMPARISON:  None. FINDINGS: Diffuse left foot soft tissue swelling, most prominent in the mid foot. No fracture or dislocation. Extensive hypertrophic degenerative and erosive change at the tarsometatarsal joints, most prominent at the second through fourth tarsometatarsal joints. No suspicious focal osseous lesions. No radiopaque foreign body. Vascular calcifications throughout the soft tissues. Small plantar left calcaneal spur. IMPRESSION: Diffuse left foot soft tissue swelling, most prominent in the mid foot. Extensive hypertrophic degenerative and erosive changes at the tarsometatarsal joints, most prominent at the second through fourth tarsometatarsal joints. Differential includes Charcot joint changes of diabetes mellitus versus osteomyelitis. Further evaluation with MRI of the left foot may be obtained as clinically warranted. Electronically Signed   By: Ilona Sorrel M.D.   On: 10/19/2018 13:42   Vas [place] Lower Extremity Venous (dvt) (only [facility])  Result Date: 10/19/2018  Lower Venous Study Indications: Swelling, Erythema, and fever, ulcer on lateral left foot.  Limitations: Poor ultrasound/tissue interface. Performing Technologist: Maudry Mayhew MHA, RDMS, RVT, RDCS  Examination Guidelines: A complete evaluation includes B-mode imaging, spectral Doppler, color Doppler, and power Doppler as needed of all accessible portions of each vessel. Bilateral testing is considered an integral part of a complete examination. Limited examinations for reoccurring indications may be performed as noted.  Right Venous Findings:  +---+---------------+---------+-----------+----------+--------------+    CompressibilityPhasicitySpontaneityPropertiesSummary        +---+---------------+---------+-----------+----------+--------------+ CFV                                             Not visualized +---+---------------+---------+-----------+----------+--------------+  Left Venous Findings: +---------+---------------+---------+-----------+----------+--------------+          CompressibilityPhasicitySpontaneityPropertiesSummary        +---------+---------------+---------+-----------+----------+--------------+ CFV      Full           Yes      Yes                                 +---------+---------------+---------+-----------+----------+--------------+ SFJ      Full                                                        +---------+---------------+---------+-----------+----------+--------------+ FV Prox  Full                                                        +---------+---------------+---------+-----------+----------+--------------+ FV Mid   Full                                                        +---------+---------------+---------+-----------+----------+--------------+ FV DistalFull                                                        +---------+---------------+---------+-----------+----------+--------------+  PFV      Full                                                        +---------+---------------+---------+-----------+----------+--------------+ POP      Full           Yes      Yes                                 +---------+---------------+---------+-----------+----------+--------------+ PTV      Full                                                        +---------+---------------+---------+-----------+----------+--------------+ PERO                                                  Not visualized  +---------+---------------+---------+-----------+----------+--------------+    Summary: Left: There is no evidence of deep vein thrombosis in the lower extremity. However, portions of this examination were limited- see technologist comments above. No cystic structure found in the popliteal fossa.  *See table(s) above for measurements and observations.    Preliminary     Pending Labs Unresulted Labs (From admission, onward)    Start     Ordered   10/19/18 1219  Lactic acid, plasma  Now then every 2 hours,   STAT     10/19/18 1219   10/19/18 1218  Culture, blood (routine x 2)  BLOOD CULTURE X 2,   STAT     10/19/18 1219   Signed and Held  Hemoglobin A1c  Once,   R     Signed and Held   Signed and Held  HIV antibody  Once,   R     Signed and Held   Signed and Held  Sedimentation rate  Once,   R     Signed and Held   Signed and Held  C-reactive protein  Once,   R     Signed and Held   Signed and Held  Prealbumin  Once,   R     Signed and Held   Signed and Held  CBC  (enoxaparin (LOVENOX)    CrCl >/= 30 ml/min)  Once,   R    Comments:  Baseline for enoxaparin therapy IF NOT ALREADY DRAWN.  Notify MD if PLT < 100 K.    Signed and Held   Signed and Held  Creatinine, serum  (enoxaparin (LOVENOX)    CrCl >/= 30 ml/min)  Once,   R    Comments:  Baseline for enoxaparin therapy IF NOT ALREADY DRAWN.    Signed and Held   Signed and Held  Creatinine, serum  (enoxaparin (LOVENOX)    CrCl >/= 30 ml/min)  Weekly,   R    Comments:  while on enoxaparin therapy    Signed and Held   Signed and Held  Basic metabolic panel  Tomorrow morning,   R     Signed  and Held   Signed and Held  CBC  Tomorrow morning,   R     Signed and Held          Vitals/Pain Today's Vitals   10/19/18 1159 10/19/18 1201 10/19/18 1629  BP: (!) 142/65    Pulse: 73    Resp: 18    Temp: 98 F (36.7 C)    TempSrc: Oral    SpO2: 100%    Weight:  73.5 kg   Height:  5\' 6"  (1.676 m)   PainSc:  8  0-No pain     Isolation Precautions Droplet precaution  Medications Medications  vancomycin (VANCOCIN) 1,500 mg in sodium chloride 0.9 % 500 mL IVPB (1,500 mg Intravenous New Bag/Given 10/19/18 1503)  piperacillin-tazobactam (ZOSYN) IVPB 3.375 g (has no administration in time range)  vancomycin (VANCOCIN) 1,500 mg in sodium chloride 0.9 % 500 mL IVPB (has no administration in time range)  sodium chloride 0.9 % bolus 500 mL (500 mLs Intravenous New Bag/Given 10/19/18 1436)  acetaminophen (TYLENOL) tablet 650 mg (650 mg Oral Given 10/19/18 1437)  piperacillin-tazobactam (ZOSYN) IVPB 3.375 g (0 g Intravenous Stopped 10/19/18 1500)    Mobility walks Low fall risk   Focused Assessments stable   R Recommendations: See Admitting Provider Note  Report given to:   Additional Notes:

## 2018-10-19 NOTE — Progress Notes (Signed)
Pharmacy Antibiotic Note  [name] is a 68 y.o. male admitted on 10/19/2018 with osteomyelitis.  Pharmacy has been consulted for vancomycin/zosyn dosing. Afebrile. SCr 1.9 on admit.   Plan: Zosyn 3.375g IV (56min inf) x1; then 3.375g IV q8h (4h inf) Vancomycin 1500mg  IV x 1; then Vancomycin 1500 mg IV Q 48 hrs. Goal AUC 400-550. Expected AUC: 484 SCr used: 1.9 Monitor clinical progress, c/s, renal function F/u de-escalation plan/LOT, vancomycin levels as indicated   Height: 5\' 6"  (167.6 cm) Weight: 162 lb (73.5 kg) IBW/kg (Calculated) : 63.8  Temp (24hrs), Avg:98 F (36.7 C), Min:98 F (36.7 C), Max:98 F (36.7 C)  No results for input(s): WBC, CREATININE, LATICACIDVEN, VANCOTROUGH, VANCOPEAK, VANCORANDOM, GENTTROUGH, GENTPEAK, GENTRANDOM, TOBRATROUGH, TOBRAPEAK, TOBRARND, AMIKACINPEAK, AMIKACINTROU, AMIKACIN in the last 168 hours.  CrCl cannot be calculated (Patient's most recent lab result is older than the maximum 21 days allowed.).    Allergies  Allergen Reactions  . Ace Inhibitors Itching and Cough  . Naproxen Anaphylaxis    REACTION: Throat swells and cannot breathe   Elicia Lamp, PharmD, BCPS Please check AMION for all [facility] contact numbers Clinical Pharmacist 10/19/2018 1:01 PM

## 2018-10-19 NOTE — Progress Notes (Signed)
Pt has wound on L foot, scant serous draining, assessed, cleansed with NS, applied dry gauze and kerlix, Pt denied pain, tolerated well.

## 2018-10-19 NOTE — Progress Notes (Signed)
Inpatient Diabetes Program Recommendations  AACE/ADA: New Consensus Statement on Inpatient Glycemic Control (2015)  Target Ranges:  Prepandial:   less than 140 mg/dL      Peak postprandial:   less than 180 mg/dL (1-2 hours)      Critically ill patients:  140 - 180 mg/dL   Lab Results  Component Value Date   GLUCAP 306 (H) 10/19/2018   HGBA1C 11.9 (H) 07/18/2018    Review of Glycemic Control  Diabetes history: Type 2 Outpatient Diabetes medications: Toujeo 50 units daily, Trulicity 5.43 mg per week Current orders for Inpatient glycemic control: none  Inpatient Diabetes Program Recommendations:   If patient is admitted, recommend starting Lantus 50 units daily and Novolog SENSITIVE (0-9 units) TID & HS if eating. Will continue to monitor blood sugars while in the hospital.  Harvel Ricks RN BSN CDE Diabetes Coordinator Pager: [phone]  8am-5pm

## 2018-10-19 NOTE — Social Work (Signed)
CSW acknowledging consult for access to medications at discharge.  For medication access please consult RN Case Management.   CSW signing off. Please consult if any additional needs arise.  Brekyn Huntoon, MSW, LCSWA [facility] Clinical Social Work [phone]     

## 2018-10-19 NOTE — ED Provider Notes (Signed)
[facility] EMERGENCY DEPARTMENT Provider Note   CSN: [id number] Arrival date & time: 10/19/18  1148    History   Chief Complaint No chief complaint on file.   HPI [name] is a 68 y.o. male.     HPI  Patient is a 68-year-old male with a history of type 2 diabetes, prostate cancer, hyperlipidemia presenting for myalgias and rigors.  Patient reports that he has been occurring over the past 5 days.  He reports that a couple days ago he was bedridden due to his symptoms.  Reports night sweats, and his wife reports that he felt "warm", however he did not have any recorded fevers at home, he did not take his temperature.  Denies any headache, vision changes, sore throat, rhinorrhea, congestion, or cough.  Denies any chest pain or shortness of breath.  Denies any abdominal pain, nausea, vomiting, diarrhea, dysuria, urgency, frequency.  Patient does report that over the past couple weeks he has noticed a "soreness" over the lateral aspect of his left foot.  Denies any known injury to it.  His wife reports it is been draining "pink fluid".  He also reports that he has chronic left lower leg swelling but it may have been increased.  He and his wife do report that it appears that the left lower extremity is more erythematous compared to the right.  Past Medical History:  Diagnosis Date  . Diabetic retinopathy (HCC)   . Diabetic retinopathy associated with type 2 diabetes mellitus (HCC) 09/13/2014   He works for [employer]  . Fluid overload 09/03/2012   Post op  . GERD (gastroesophageal reflux disease)   . Prostate cancer (HCC)   . Visual impairment     Patient Active Problem List   Diagnosis Date Noted  . Adrenal insufficiency (HCC) 04/09/2015  . Peripheral edema 09/17/2014  . DOE (dyspnea on exertion) 09/17/2014  . Bronchospasm, acute 09/16/2014  . Partial small bowel obstruction (HCC) 09/16/2014  . Type 2 diabetes mellitus with hyperglycemia (HCC)    . Diabetic retinopathy associated with type 2 diabetes mellitus (HCC) 09/13/2014  . Abdominal pain 09/13/2014  . Cough 11/15/2012  . Pharyngitis 09/14/2012  . Fluid overload 09/03/2012  . Ileus following gastrointestinal surgery (HCC) 09/03/2012  . Hypokalemia 08/30/2012  . Small bowel obstruction s/p LOA Jan2014 08/21/2012  . Abnormal EKG 08/21/2012  . Chest pain 08/21/2012  . Macular degeneration (senile) of retina 12/15/2011  . Lens replaced 12/15/2011  . Vitreous hemorrhage (HCC) 12/15/2011  . Essential hypertension 08/19/2008  . Regional enteritis of small intestine (HCC) 01/03/2008  . ERECTILE DYSFUNCTION, ORGANIC 01/03/2008  . Cellulitis and abscess of face 10/11/2007  . Sarcoidosis 07/19/2007  . Diabetes mellitus (HCC) 07/19/2007  . HYPERLIPIDEMIA, MIXED 07/19/2007  . CERUMEN IMPACTION, BILATERAL 07/19/2007  . URINARY INCONTINENCE, STRESS, MALE 08/17/2006  . PROSTATE CANCER, HX OF 08/17/2006  . DIABETIC  RETINOPATHY 01/09/2002    Past Surgical History:  Procedure Laterality Date  . CATARACT EXTRACTION W/ INTRAOCULAR LENS  IMPLANT, BILATERAL    . EYE SURGERY Bilateral    "laser OR for diabetic retinopathy"  . INGUINAL HERNIA REPAIR     /notes 07/12/2010), "don't remember which side"  . LAPAROTOMY  08/23/2012   Procedure: EXPLORATORY LAPAROTOMY;  Surgeon: Brian Layton, DO;  Location: [facility] ORS;  Service: General;  Laterality: N/A;  exploratory laparotomy with lysis of adhesions  . LYSIS OF ADHESION  08/23/2012   Procedure: LYSIS OF ADHESION;  Surgeon: Brian Layton,   DO;  Location: [facility] ORS;  Service: General;;  . ROBOT ASSISTED LAPAROSCOPIC RADICAL PROSTATECTOMY  2000's   "had to finish manually after machine broke"  . SHOULDER SURGERY  1970's   separation; from playing football"        Home Medications    Prior to Admission medications   Medication Sig Start Date End Date Taking? Authorizing Provider  aspirin 81 MG chewable tablet Chew 81 mg by mouth daily.     [provider]  atorvastatin (LIPITOR) 40 MG tablet Take 0.5 tablets (20 mg total) by mouth daily. 07/18/18   Santiago, Irma M, MD  blood glucose meter kit and supplies KIT per insurance preference. Check blood glucose three times a day. Dx E11.65, Z79.4 07/18/18   Santiago, Irma M, MD  chlorthalidone (HYGROTON) 25 MG tablet Take 1 tablet (25 mg total) by mouth daily. 07/18/18   Santiago, Irma M, MD  ciprofloxacin (CILOXAN) 0.3 % ophthalmic solution As directed 02/02/18   [provider]  Dulaglutide (TRULICITY) 0.75 MG/0.5ML SOPN Inject 0.75 mg into the skin once a week. 07/18/18   Santiago, Irma M, MD  gabapentin (NEURONTIN) 300 MG capsule Take 1 capsule (300 mg total) by mouth 3 (three) times daily. 07/18/18   Santiago, Irma M, MD  Insulin Glargine, 2 Unit Dial, (TOUJEO MAX SOLOSTAR) 300 UNIT/ML SOPN Inject 50 Units into the skin daily. 07/18/18   Santiago, Irma M, MD  Insulin Pen Needle (PEN NEEDLES) 32G X 6 MM MISC 1 each by Does not apply route daily. 10/17/18   Santiago, Irma M, MD  Lancets (FREESTYLE) lancets Use as instructed 09/18/14   Sullivan, Corinna L, MD  Lancets 30G MISC  04/06/16   [provider]  losartan (COZAAR) 100 MG tablet Take 1 tablet (100 mg total) by mouth daily. 07/18/18   Santiago, Irma M, MD  Multiple Vitamin (MULTIVITAMIN WITH MINERALS) TABS tablet Take 1 tablet by mouth daily.    [provider]  pantoprazole (PROTONIX) 40 MG tablet Take 30- 60 min before your first and last meals of the day 02/09/18   Wert, Michael B, MD    Family History Family History  Problem Relation Age of Onset  . Diabetes Mellitus II Mother   . Colon cancer Neg Hx     Social History Social History   Tobacco Use  . Smoking status: Former Smoker    Packs/day: 2.50    Years: 3.00    Pack years: 7.50    Types: Cigarettes    Last attempt to quit: 11/09/1966    Years since quitting: 51.9  . Smokeless tobacco: Never Used  Substance Use Topics  . Alcohol use:  Yes    Comment: 04/08/2015 "maybe a beer/ or 2 or a glass of wine monthly"  . Drug use: No     Allergies   Ace inhibitors and Naproxen   Review of Systems Review of Systems  Constitutional: Positive for chills and fatigue. Negative for diaphoresis and fever.  HENT: Negative for congestion, rhinorrhea, sinus pain and sore throat.   Eyes: Negative for visual disturbance.  Respiratory: Negative for cough, chest tightness and shortness of breath.   Cardiovascular: Negative for chest pain, palpitations and leg swelling.  Gastrointestinal: Negative for abdominal pain, diarrhea, nausea and vomiting.  Genitourinary: Negative for dysuria and flank pain.  Musculoskeletal: Positive for arthralgias and myalgias. Negative for back pain.  Skin: Negative for rash.  Neurological: Negative for dizziness, syncope, light-headedness and headaches.     Physical Exam   Updated Vital Signs BP (!) 142/65 (BP Location: Right Arm)   Pulse 73   Temp 98 F (36.7 C) (Oral)   Resp 18   Ht 5' 6" (1.676 m)   Wt 73.5 kg   SpO2 100%   BMI 26.15 kg/m   Physical Exam Vitals signs and nursing note reviewed.  Constitutional:      General: He is not in acute distress.    Appearance: He is well-developed.  HENT:     Head: Normocephalic and atraumatic.  Eyes:     Conjunctiva/sclera: Conjunctivae normal.     Pupils: Pupils are equal, round, and reactive to light.  Neck:     Musculoskeletal: Normal range of motion and neck supple.  Cardiovascular:     Rate and Rhythm: Normal rate and regular rhythm.     Pulses: Normal pulses.     Heart sounds: S1 normal and S2 normal. No murmur.  Pulmonary:     Effort: Pulmonary effort is normal.     Breath sounds: Normal breath sounds. No wheezing or rales.  Abdominal:     General: There is no distension.     Palpations: Abdomen is soft.     Tenderness: There is no abdominal tenderness. There is no guarding.  Musculoskeletal:        General: Swelling present.      Comments: Left lower extremity has 2+ pitting edema to the level of the mid shin.  Patient has erythematous streaking up the left lower extremity.  There is a fluctuant area with visible utilized purulence over the base of the fifth metatarsal.  Right lower extremity has minimal erythema or edema. Intact, 2+ DP pulses bilaterally.  Lymphadenopathy:     Cervical: No cervical adenopathy.  Skin:    General: Skin is warm and dry.     Findings: No erythema or rash.  Neurological:     Mental Status: He is alert.     Comments: Cranial nerves grossly intact. Patient moves extremities symmetrically and with good coordination.  Psychiatric:        Behavior: Behavior normal.        Thought Content: Thought content normal.        Judgment: Judgment normal.      ED Treatments / Results  Labs (all labs ordered are listed, but only abnormal results are displayed) Labs Reviewed  CBC WITH DIFFERENTIAL/PLATELET - Abnormal; Notable for the following components:      Result Value   Hemoglobin 11.4 (*)    HCT 37.6 (*)    MCV 66.3 (*)    MCH 20.1 (*)    RDW 15.6 (*)    All other components within normal limits  COMPREHENSIVE METABOLIC PANEL - Abnormal; Notable for the following components:   Sodium 133 (*)    Chloride 96 (*)    Glucose, Bld 296 (*)    BUN 37 (*)    Creatinine, Ser 1.90 (*)    GFR calc non Af Amer 35 (*)    GFR calc Af Amer 41 (*)    All other components within normal limits  CULTURE, BLOOD (ROUTINE X 2)  CULTURE, BLOOD (ROUTINE X 2)  LACTIC ACID, PLASMA  LACTIC ACID, PLASMA  I-STAT TROPONIN, ED  CBG MONITORING, ED    EKG EKG Interpretation  Date/Time:  Friday October 19 2018 12:59:58 EST Ventricular Rate:  71 PR Interval:  234 QRS Duration: 90 QT Interval:  394 QTC Calculation: 428 R Axis:   73 Text Interpretation:  Sinus  rhythm with 1st degree A-V block Left ventricular hypertrophy with repolarization abnormality Abnormal ECG Confirmed by Quintella Reichert [phone])  on 10/19/2018 1:22:54 PM   Radiology Dg Chest 2 View  Result Date: 10/19/2018 CLINICAL DATA:  Chills, fever EXAM: CHEST - 2 VIEW COMPARISON:  02/09/2018 chest radiograph. FINDINGS: Stable cardiomediastinal silhouette with normal heart size. No pneumothorax. No pleural effusion. Lungs appear clear, with no acute consolidative airspace disease and no pulmonary edema. IMPRESSION: No active cardiopulmonary disease. Electronically Signed   By: Ilona Sorrel M.D.   On: 10/19/2018 13:39   Dg Foot Complete Left  Result Date: 10/19/2018 CLINICAL DATA:  Open sore lateral left foot.  Chills.  Fever. EXAM: LEFT FOOT - COMPLETE 3+ VIEW COMPARISON:  None. FINDINGS: Diffuse left foot soft tissue swelling, most prominent in the mid foot. No fracture or dislocation. Extensive hypertrophic degenerative and erosive change at the tarsometatarsal joints, most prominent at the second through fourth tarsometatarsal joints. No suspicious focal osseous lesions. No radiopaque foreign body. Vascular calcifications throughout the soft tissues. Small plantar left calcaneal spur. IMPRESSION: Diffuse left foot soft tissue swelling, most prominent in the mid foot. Extensive hypertrophic degenerative and erosive changes at the tarsometatarsal joints, most prominent at the second through fourth tarsometatarsal joints. Differential includes Charcot joint changes of diabetes mellitus versus osteomyelitis. Further evaluation with MRI of the left foot may be obtained as clinically warranted. Electronically Signed   By: Ilona Sorrel M.D.   On: 10/19/2018 13:42    Procedures Procedures (including critical care time)  Medications Ordered in ED Medications  sodium chloride 0.9 % bolus 500 mL (has no administration in time range)  acetaminophen (TYLENOL) tablet 650 mg (has no administration in time range)  piperacillin-tazobactam (ZOSYN) IVPB 3.375 g (has no administration in time range)  vancomycin (VANCOCIN) 1,500 mg in sodium chloride 0.9  % 500 mL IVPB (has no administration in time range)     Initial Impression / Assessment and Plan / ED Course  I have reviewed the triage vital signs and the nursing notes.  Pertinent labs & imaging results that were available during my care of the patient were reviewed by me and considered in my medical decision making (see chart for details).  Clinical Course as of Oct 18 1413  Fri Oct 19, 2018  1352 Likely 2/2 to hyperglycemia.   Sodium(!): 133 [AM]  1352 No evidence of severe sepsis.  Lactic Acid, Venous: 1.3 [AM]  1353 Slightly worse than prior. BUN elevated. Will order fluid.   Creatinine(!): 1.90 [AM]  4403 Spoke with Dr. Daryll Drown of [facility] to admit patient.  I appreciate her involvement in the care of this patient.   [AM]    Clinical Course User Index [AM] Albesa Seen, PA-C       Patient is nontoxic-appearing, afebrile, and hemodynamically stable.  He is shivering on exam.  He continues to be afebrile but has consistent rigors.  Based on the signs and symptoms of systemic infection, as well as likely source of infection with possible osteomyelitis of the left foot with cellulitis associated in the left lower extremity, broad-spectrum antibiotics for diabetic foot wounds initiated.  Patient not meeting criteria for SIRS or sepsis at this time.  Lactic is normal.  No leukocytosis.  He does have a slightly elevated creatinine compared to his baseline around 1.5.  Creatinine is 1.9 today, no elevation in BUN.  Troponin negative.  EKG without evidence of ischemia, infection, or arrhythmia.  No evidence of infiltrate on  chest x-ray.  Stable vital signs as below.  Vitals:   10/19/18 1159 10/19/18 1201  BP: (!) 142/65   Pulse: 73   Resp: 18   Temp: 98 F (36.7 C)   TempSrc: Oral   SpO2: 100%   Weight:  73.5 kg  Height:  5' 6" (1.676 m)    Patient admitted per Dr. Mullen of [facility].  This is a shared visit with Dr. Elizabeth Rees. Patient was  independently evaluated by this attending physician. Attending physician consulted in evaluation and management.  Final Clinical Impressions(s) / ED Diagnoses   Final diagnoses:  Diabetic foot infection (HCC)  Hyperglycemia    ED Discharge Orders    None       ,  B, PA-C 10/19/18 1416    Rees, Elizabeth, MD 10/21/18 1139  

## 2018-10-19 NOTE — Plan of Care (Signed)

## 2018-10-19 NOTE — Progress Notes (Signed)
Left lower extremity venous duplex completed.  Refer to "CV Proc" under chart review to view preliminary results.  10/19/2018 3:09 PM Maudry Mayhew, MHA, RVT, RDCS, RDMS

## 2018-10-19 NOTE — ED Notes (Signed)
IV team unable to collect 2nd set of Blood Cultures This RN was able to collect approx 3cc of blood for 2nd set of cultures

## 2018-10-19 NOTE — ED Triage Notes (Addendum)
Pt here with chills and body aches since Sunday.  Pt's wife gave him 23 of Tylenol last PM.  Pt denies N/V/D and cough.  Also c/o left foot pain x2-3 weeks.

## 2018-10-19 NOTE — ED Notes (Signed)
IV team at bedside 

## 2018-10-19 NOTE — H&P (Signed)
History and Physical    [name]:[id number] DOB: 1951/03/11 DOA: 10/19/2018  PCP: [name], MD  Patient coming from: Home Chief Complaint: Myalgias  HPI: [name] is a 68 y.o. male with medical history significant of prostate cancer s/p prostatectomy, GERD, DM2, HTN who presents for chills and myalgias.  He reports that he has been having issues with his feet for about 3 weeks.  He noted that his left foot was bothering him, felt like something was in his shoe, but he never saw anything in there.  About 2 weeks ago it started draining clear and sanguinous fluid.  He noticed this in his socks at the end of the day.  Also about 1 week ago, he noticed myalgias, chills and sweats which were soaking his sheets.  He felt like he might have the flu, so he came in to the hospital.  He was taking tylenol and tramadol for the pain in his feet, but this was not helping.  He has been noticing swelling in his feet and has [facility] [place] for 6 months of SOB.  He had a TTE ordered and a stress test performed in the work up of this.  Right now, he states that these things are under control.  He notes that he recently was not able to afford his insulin and had not been taking it for 2 weeks.  He was able to get it yesterday and yesterday was his first dose.  He has not picked up Trulicity because it is too expensive for him.    ED Course: In the ED, he was noted to have a Cr of 1.9 (up from baseline around 1.2), lactate 1.3, WBC 7.2, H/H mildly low at 11 and 37.  Last A1C from December was 11.9.  He had an xray of his foot which showed charcot type changes and possible osteomyelitis.  Recommended an MRI for further investigation.  He was started on Zosyn and Vancomycin.    Review of Systems: As per HPI otherwise 10 point review of systems negative.    Past Medical History:  Diagnosis Date  . Diabetic retinopathy ([place])   . Diabetic retinopathy associated  with type 2 diabetes mellitus ([place]) 09/13/2014   He works for [employer]  . Fluid overload 09/03/2012   Post op  . GERD (gastroesophageal reflux disease)   . Prostate cancer ([place])   . Visual impairment     Past Surgical History:  Procedure Laterality Date  . CATARACT EXTRACTION W/ INTRAOCULAR LENS  IMPLANT, BILATERAL    . EYE SURGERY Bilateral    "laser OR for diabetic retinopathy"  . INGUINAL HERNIA REPAIR     [name] 07/12/2010), "don't remember which side"  . LAPAROTOMY  08/23/2012   Procedure: EXPLORATORY LAPAROTOMY;  Surgeon: [name], DO;  Location: [facility];  Service: General;  Laterality: N/A;  exploratory laparotomy with lysis of adhesions  . LYSIS OF ADHESION  08/23/2012   Procedure: LYSIS OF ADHESION;  Surgeon: [name], DO;  Location: [facility];  Service: General;;  . ROBOT ASSISTED LAPAROSCOPIC RADICAL PROSTATECTOMY  2000's   "had to finish manually after machine broke"  . SHOULDER SURGERY  1970's   separation; from playing football"   Reviewed with the patient.    reports that he quit smoking about 51 years ago. His smoking use included cigarettes. He has a 7.50 pack-year smoking history. He has never used smokeless tobacco.  He reports current alcohol use. He reports that he does not use drugs.  Allergies  Allergen Reactions  . Ace Inhibitors Itching and Cough  . Naproxen Anaphylaxis, Shortness Of Breath and Other (See Comments)    Throat swells. cannot breathe, and causes GI distress   Reviewed.  Family History  Problem Relation Age of Onset  . Diabetes Mellitus II Mother   . Colon cancer Neg Hx      Prior to Admission medications   Medication Sig Start Date End Date Taking? Authorizing Provider  aspirin 81 MG chewable tablet Chew 81 mg by mouth daily.    [provider]  atorvastatin (LIPITOR) 40 MG tablet Take 0.5 tablets (20 mg total) by mouth daily. 07/18/18   [name], MD  blood glucose meter kit and supplies KIT per  insurance preference. Check blood glucose three times a day. Dx E11.65, Z79.4 07/18/18   [name], MD  chlorthalidone (HYGROTON) 25 MG tablet Take 1 tablet (25 mg total) by mouth daily. REPORTS NOT TAKING 07/18/18   [name], MD  ciprofloxacin (CILOXAN) 0.3 % ophthalmic solution As directed 02/02/18   [provider]  Dulaglutide (TRULICITY) 1.47 WG/9.5AO SOPN Inject 0.75 mg into the skin once a week. REPORTS NOT TAKIN 07/18/18   [name], MD  gabapentin (NEURONTIN) 300 MG capsule Take 1 capsule (300 mg total) by mouth 3 (three) times daily. 07/18/18   [name], MD  Insulin Glargine, 2 Unit Dial, (TOUJEO MAX SOLOSTAR) 300 UNIT/ML SOPN Inject 50 Units into the skin daily. 07/18/18   [name], MD  Insulin Pen Needle (PEN NEEDLES) 32G X 6 MM MISC 1 each by Does not apply route daily. 10/17/18   [name], MD  Lancets (FREESTYLE) lancets Use as instructed 09/18/14   Delfina Redwood, MD  Lancets 30G MISC  04/06/16   [provider]  losartan (COZAAR) 100 MG tablet Take 1 tablet (100 mg total) by mouth daily. 07/18/18   [name], MD  Multiple Vitamin (MULTIVITAMIN WITH MINERALS) TABS tablet Take 1 tablet by mouth daily.    [provider]  pantoprazole (PROTONIX) 40 MG tablet Take 30- 60 min before your first and last meals of the day 02/09/18   Tanda Rockers, MD    Physical Exam:  Constitutional: NAD, calm, comfortable, sitting in chair Vitals:   10/19/18 1159 10/19/18 1201 10/19/18 1646 10/19/18 1727  BP: (!) 142/65  (!) 149/66 (!) 125/54  Pulse: 73  66 62  Resp: '18  16 16  ' Temp: 98 F (36.7 C)  98 F (36.7 C) 98.5 F (36.9 C)  TempSrc: Oral  Oral Oral  SpO2: 100%  100% 100%  Weight:  73.5 kg    Height:  '5\' 6"'  (1.676 m)     Eyes: lids and conjunctivae normal ENMT: Wearing a mask Neck: normal, supple, no masses Respiratory: CTAB, no wheezing, easy work of breathing.  Cardiovascular: RR, NR, no murmur.  2+ pulses  in bilateral upper extremities. 1+ in DP on the right, difficult to palpate on the left but likely 1+.  He has pitting edema on the left leg to the mid calf.     Abdomen: +BS, NT, ND Musculoskeletal: He has deformity of the bottom of the feet, loss of central arch.  He has a wound on the left lateral foot.  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   Skin: Erythema  and warm to the touch on the left lower extremity to knee.  He has a small open wound/pus pocket on the lateral foot margin on the left.  Right LE with trace edema, cooler to the touch, chronic skin changes.  Neurologic: Grossly intact, sensation intact to light touch int he feet.   Psychiatric: Normal judgment and insight. Alert and oriented x 3. Normal mood.    Labs on Admission: I have personally reviewed following labs and imaging studies  CBC: Recent Labs  Lab 10/19/18 1242  WBC 7.2  NEUTROABS 5.6  HGB 11.4*  HCT 37.6*  MCV 66.3*  PLT PLATELET CLUMPS NOTED ON SMEAR, COUNT APPEARS ADEQUATE   Basic Metabolic Panel: Recent Labs  Lab 10/19/18 1242  NA 133*  K 4.2  CL 96*  CO2 28  GLUCOSE 296*  BUN 37*  CREATININE 1.90*  CALCIUM 9.7   GFR: Estimated Creatinine Clearance: 33.1 mL/min (A) (by C-G formula based on SCr of 1.9 mg/dL (H)). Liver Function Tests: Recent Labs  Lab 10/19/18 1242  AST 19  ALT 21  ALKPHOS 109  BILITOT 1.0  PROT 7.8  ALBUMIN 3.5   No results for input(s): LIPASE, AMYLASE in the last 168 hours. No results for input(s): AMMONIA in the last 168 hours. Coagulation Profile: No results for input(s): INR, PROTIME in the last 168 hours. Cardiac Enzymes: No results for input(s): CKTOTAL, CKMB, CKMBINDEX, TROPONINI in the last 168 hours. BNP (last 3 results) No results for input(s): PROBNP in the last 8760 hours. HbA1C: No results for input(s): HGBA1C in the last 72 hours. CBG: Recent Labs  Lab 10/19/18 1345 10/19/18 1751  GLUCAP 306* 252*   Lipid  Profile: No results for input(s): CHOL, HDL, LDLCALC, TRIG, CHOLHDL, LDLDIRECT in the last 72 hours. Thyroid Function Tests: No results for input(s): TSH, T4TOTAL, FREET4, T3FREE, THYROIDAB in the last 72 hours. Anemia Panel: No results for input(s): VITAMINB12, FOLATE, FERRITIN, TIBC, IRON, RETICCTPCT in the last 72 hours. Urine analysis:    Component Value Date/Time   COLORURINE YELLOW 09/03/2015 1505   APPEARANCEUR CLEAR 09/03/2015 1505   LABSPEC 1.035 (H) 09/03/2015 1505   PHURINE 6.0 09/03/2015 1505   GLUCOSEU >1000 (A) 09/03/2015 1505   HGBUR NEGATIVE 09/03/2015 1505   HGBUR negative 08/19/2008 1530   BILIRUBINUR NEGATIVE 09/03/2015 1505   KETONESUR NEGATIVE 09/03/2015 1505   PROTEINUR NEGATIVE 09/03/2015 1505   UROBILINOGEN 0.2 04/08/2015 0456   NITRITE NEGATIVE 09/03/2015 1505   LEUKOCYTESUR NEGATIVE 09/03/2015 1505    Radiological Exams on Admission: Dg Chest 2 View  Result Date: 10/19/2018 CLINICAL DATA:  Chills, fever EXAM: CHEST - 2 VIEW COMPARISON:  02/09/2018 chest radiograph. FINDINGS: Stable cardiomediastinal silhouette with normal heart size. No pneumothorax. No pleural effusion. Lungs appear clear, with no acute consolidative airspace disease and no pulmonary edema. IMPRESSION: No active cardiopulmonary disease. Electronically Signed   By: Ilona Sorrel M.D.   On: 10/19/2018 13:39   Dg Foot Complete Left  Result Date: 10/19/2018 CLINICAL DATA:  Open sore lateral left foot.  Chills.  Fever. EXAM: LEFT FOOT - COMPLETE 3+ VIEW COMPARISON:  None. FINDINGS: Diffuse left foot soft tissue swelling, most prominent in the mid foot. No fracture or dislocation. Extensive hypertrophic degenerative and erosive change at the tarsometatarsal joints, most prominent at the second through fourth tarsometatarsal joints. No suspicious focal osseous lesions. No radiopaque foreign body. Vascular calcifications throughout the soft tissues. Small plantar left calcaneal spur. IMPRESSION: Diffuse  left foot soft tissue swelling, most prominent  in the mid foot. Extensive hypertrophic degenerative and erosive changes at the tarsometatarsal joints, most prominent at the second through fourth tarsometatarsal joints. Differential includes Charcot joint changes of diabetes mellitus versus osteomyelitis. Further evaluation with MRI of the left foot may be obtained as clinically warranted. Electronically Signed   By: Ilona Sorrel M.D.   On: 10/19/2018 13:42   Vas [place] Lower Extremity Venous (dvt) (only [facility])  Result Date: 10/19/2018  Lower Venous Study Indications: Swelling, Erythema, and fever, ulcer on lateral left foot.  Limitations: Poor ultrasound/tissue interface. Performing Technologist: Maudry Mayhew MHA, RDMS, RVT, RDCS  Examination Guidelines: A complete evaluation includes B-mode imaging, spectral Doppler, color Doppler, and power Doppler as needed of all accessible portions of each vessel. Bilateral testing is considered an integral part of a complete examination. Limited examinations for reoccurring indications may be performed as noted.  Right Venous Findings: +---+---------------+---------+-----------+----------+--------------+    CompressibilityPhasicitySpontaneityPropertiesSummary        +---+---------------+---------+-----------+----------+--------------+ CFV                                             Not visualized +---+---------------+---------+-----------+----------+--------------+  Left Venous Findings: +---------+---------------+---------+-----------+----------+--------------+          CompressibilityPhasicitySpontaneityPropertiesSummary        +---------+---------------+---------+-----------+----------+--------------+ CFV      Full           Yes      Yes                                 +---------+---------------+---------+-----------+----------+--------------+ SFJ      Full                                                         +---------+---------------+---------+-----------+----------+--------------+ FV Prox  Full                                                        +---------+---------------+---------+-----------+----------+--------------+ FV Mid   Full                                                        +---------+---------------+---------+-----------+----------+--------------+ FV DistalFull                                                        +---------+---------------+---------+-----------+----------+--------------+ PFV      Full                                                        +---------+---------------+---------+-----------+----------+--------------+  POP      Full           Yes      Yes                                 +---------+---------------+---------+-----------+----------+--------------+ PTV      Full                                                        +---------+---------------+---------+-----------+----------+--------------+ PERO                                                  Not visualized +---------+---------------+---------+-----------+----------+--------------+    Summary: Left: There is no evidence of deep vein thrombosis in the lower extremity. However, portions of this examination were limited- see technologist comments above. No cystic structure found in the popliteal fossa.  *See table(s) above for measurements and observations. Electronically signed by Monica Martinez MD on 10/19/2018 at 5:57:24 PM.    Final     EKG: Independently reviewed. SR with repol abnormality.  TWI in lead V4 and V5.  TWI was apparent in V5 previously  Assessment/Plan Diabetic foot ulcer Type 2 diabetes mellitus with hyperglycemia - Does not currently meet SIRS criteria - Wound present for about 3 weeks, charcot foot on Xray, possible OM - MRI of the foot pending - Abx with Vancomycin, Rocephin and Flagyl to cover for likely pathogens - BC X 2 pending - ABIs  ordered - Consult to nutrition, diabetes educator and ischemic limb navigator (given the high propensity for patients with DM to have concomitant vascular disease) - Wound care - ESR/CRP - He was initially placed on droplet precautions given concern for influenza, however, this will be discontinued as he has no respiratory symptoms - Consider surgery consult.  Patient reported to me that he would never undergo an amputation.  - Continue gabapentin - Insulin detemir 10 units + sliding scale, follow up DM coordinator recs - This diagnosis + his uncontrolled DM puts him at high risk for complications including sepsis and possible amputation.  He will require IV antibiotics for at least 2 days and further imaging.    AKI on CKD - Baseline appears to be around 1.2 - 1.4, now 1.9 - IVF with NS at 75cc/hr for 10 hours, recheck BMET in the AM - Hold nephrotoxins    H/O Sarcoidosis - Intermittent SOB/DOE is being worked up by his PCP and cardiologist in [place] - MOnitor  Essential hypertension - BP ranging from 790X systolic to 833X - Hold losartan for now given AKI - PRN Hydralazine for SBP sustaining > 180 - Consider starting amlodipine  DOE/concern for CAD - Reviewed last cardiology note, patient underwent stress test and is due for a TTE.  He reports no issues with these symptoms at this time - Continue outpatient follow up unless clinical situation changes.         DVT prophylaxis: Lovenox Code Status: Full  Disposition Plan: Admit for Abx  Consults called: DM counselor, vascular coordinator  Admission status: Med Surg, INP    Gilles Chiquito MD [facility]  If 7PM-7AM, please contact night-coverage [email or web address]   10/19/2018, 6:34 PM

## 2018-10-19 NOTE — Plan of Care (Signed)

## 2018-10-20 ENCOUNTER — Inpatient Hospital Stay (HOSPITAL_COMMUNITY): Payer: Medicare Other

## 2018-10-20 DIAGNOSIS — L089 Local infection of the skin and subcutaneous tissue, unspecified: Secondary | ICD-10-CM

## 2018-10-20 DIAGNOSIS — E11628 Type 2 diabetes mellitus with other skin complications: Principal | ICD-10-CM

## 2018-10-20 LAB — CBC
HCT: 32.4 % — ABNORMAL LOW (ref 39.0–52.0)
Hemoglobin: 9.8 g/dL — ABNORMAL LOW (ref 13.0–17.0)
MCH: 19.9 pg — ABNORMAL LOW (ref 26.0–34.0)
MCHC: 30.2 g/dL (ref 30.0–36.0)
MCV: 65.7 fL — ABNORMAL LOW (ref 80.0–100.0)
Platelets: ADEQUATE 10*3/uL (ref 150–400)
RBC: 4.93 MIL/uL (ref 4.22–5.81)
RDW: 15.5 % (ref 11.5–15.5)
WBC: 4 10*3/uL (ref 4.0–10.5)
nRBC: 0 % (ref 0.0–0.2)

## 2018-10-20 LAB — GLUCOSE, CAPILLARY
Glucose-Capillary: 136 mg/dL — ABNORMAL HIGH (ref 70–99)
Glucose-Capillary: 87 mg/dL (ref 70–99)
Glucose-Capillary: 90 mg/dL (ref 70–99)
Glucose-Capillary: 148 mg/dL — ABNORMAL HIGH (ref 70–99)

## 2018-10-20 LAB — BASIC METABOLIC PANEL
Anion gap: 12 (ref 5–15)
BUN: 32 mg/dL — AB (ref 8–23)
CO2: 24 mmol/L (ref 22–32)
Calcium: 8.7 mg/dL — ABNORMAL LOW (ref 8.9–10.3)
Chloride: 101 mmol/L (ref 98–111)
Creatinine, Ser: 1.81 mg/dL — ABNORMAL HIGH (ref 0.61–1.24)
GFR calc Af Amer: 43 mL/min — ABNORMAL LOW (ref >60)
GFR calc non Af Amer: 37 mL/min — ABNORMAL LOW (ref >60)
Glucose, Bld: 186 mg/dL — ABNORMAL HIGH (ref 70–99)
Potassium: 4.3 mmol/L (ref 3.5–5.1)
Sodium: 137 mmol/L (ref 135–145)

## 2018-10-20 LAB — HIV ANTIBODY (ROUTINE TESTING W REFLEX): HIV Screen 4th Generation wRfx: NONREACTIVE

## 2018-10-20 MED ORDER — GLUCERNA SHAKE PO LIQD
237.0000 mL | Freq: Two times a day (BID) | ORAL | Status: DC
  Administered 2018-10-20 – 2018-10-22 (×5): 237 mL via ORAL

## 2018-10-20 NOTE — Plan of Care (Signed)

## 2018-10-20 NOTE — Consult Note (Signed)
Reason For Consult:  Left diabetic foot wound Consulting Provider:  Domenic Polite, MD  The patient is a 68 year old diabetic male who orthopedics has been asked to see as a consultation to evaluate and treat a diabetic foot wound involving his left foot.  He presented to the emergency room yesterday with fever and chills and left foot pain.  He has been dealing with this for about 2 to 3 weeks now.  His wife is at the bedside and says that he has had a wound that is been draining.  He does report peripheral neuropathy but this is been painful to him.  On examination of his left foot his foot is well-perfused.  There is a small wound on the lateral aspect of his foot along the fifth ray.  I tried to express fluid from this and only got minimal fluid.  There certainly may be underlying edema in this area.  His x-rays do show some Charcot changes in the midfoot and midfoot collapse.  I reviewed the MRI extensively and there is no evidence of osteomyelitis and no fluid collection or drainable abscess in the left foot.  He and his wife say that he has been feeling better and less pain since being on antibiotics.  Given the fact that I could not express anything out of the wound at this point although I can see where it is been draining, I would like to watch his foot over the next 24 hours to see how it does.  Given the amount of pain that he is having, I do not feel that he would tolerate a bedside irrigation and debridement because it could turn out being more extensive than anticipated.  I do feel that it is likely just fluid in the superficial tissues.  The concern about operating on any poorly controlled diabetic is their ability to heal a wound once that course is taken.  The patient is actually seen my partner Dr. [name] in the past remotely but for a knee issue.  I will see the patient again tomorrow and have a better idea of how best to proceed and will also likely contact Dr. [name] about the patient as  well.

## 2018-10-20 NOTE — Progress Notes (Signed)
Initial Nutrition Assessment  DOCUMENTATION CODES:   Not applicable  INTERVENTION:  Provide Glucerna Shake po BID, each supplement provides 220 kcal and 10 grams of protein.  Encourage adequate PO intake.   NUTRITION DIAGNOSIS:   Increased nutrient needs related to wound healing as evidenced by estimated needs.  GOAL:   Patient will meet greater than or equal to 90% of their needs  MONITOR:   PO intake, Supplement acceptance, Labs, I & O's, Weight trends, Skin  REASON FOR ASSESSMENT:   Consult Wound healing  ASSESSMENT:   68 year old male with history of prostate cancer status post prostatectomy, type 2 diabetes mellitus, hypertension GERD presented to the emergency room with fever and chills. Pt with swelling in his left lateral foot with small wound and purulent drainage. MRI was done which was notable for soft tissue swelling, no osteomyelitis.   Meal completion 85%. Pt reports having a good appetite currently and PTA with usual consumption of at least 3 meals a day with snacks in between. Pt with a 9% weight loss in 3 months. Pt reports weight loss has been intentional as he has been making healthier dietary changes by consuming more fresh fruits, vegetables, and whole grain and limiting his starches and sugar sweetened food items. RD to order nutritional supplements to aid in wound healing. Pt educated on increased protein needs for wound healing. Pt reports understanding. Labs and medications reviewed.   NUTRITION - FOCUSED PHYSICAL EXAM:    Most Recent Value  Orbital Region  No depletion  Upper Arm Region  No depletion  Thoracic and Lumbar Region  No depletion  Buccal Region  No depletion  Temple Region  No depletion  Clavicle Bone Region  No depletion  Clavicle and Acromion Bone Region  No depletion  Scapular Bone Region  Unable to assess  Dorsal Hand  No depletion  Patellar Region  No depletion  Anterior Thigh Region  No depletion  Posterior Calf Region  No  depletion  Edema (RD Assessment)  Mild  Hair  Reviewed  Eyes  Reviewed  Mouth  Reviewed  Skin  Reviewed  Nails  Reviewed       Diet Order:   Diet Order            Diet heart healthy/carb modified Room service appropriate? Yes; Fluid consistency: Thin  Diet effective now              EDUCATION NEEDS:   Education needs have been addressed  Skin:  Skin Assessment: Skin Integrity Issues: Skin Integrity Issues:: Diabetic Ulcer Diabetic Ulcer: L foot  Last BM:  3/5  Height:   Ht Readings from Last 1 Encounters:  10/19/18 5\' 6"  (1.676 m)    Weight:   Wt Readings from Last 1 Encounters:  10/19/18 73.5 kg    Ideal Body Weight:  64.5 kg  BMI:  Body mass index is 26.15 kg/m.  Estimated Nutritional Needs:   Kcal:  1900-2100  Protein:  100-110 grams  Fluid:  1.9 - 2.1 L/day    Corrin Parker, MS, RD, LDN Pager # [phone] After hours/ weekend pager # [phone]

## 2018-10-20 NOTE — Progress Notes (Signed)
PROGRESS NOTE    [name]  SEG:[id number] DOB: 1951-03-05 DOA: 10/19/2018 PCP: [name], MD  Brief Narrative: 68 year old male with history of prostate cancer status post prostatectomy, type 2 diabetes mellitus, hypertension GERD presented to the emergency room with fever and chills.  He had been having swelling discomfort and some drainage from the lateral aspect of his left foot for 2 to 3 weeks.  In addition also was not able to afford his insulin and hence had not been taking it for 2 weeks and just recently got his supplies. -In the emergency room he was noted to have swelling in his left lateral foot with small wound and purulent drainage.  MRI was done which was notable for soft tissue swelling, no osteomyelitis he was started on broad-spectrum antibiotics   Assessment & Plan:   Diabetic foot infection -Swelling of the left foot improving on antibiotics patient has a small wound on the lateral aspect of the foot with some purulent discharge -I suspect he will need an I&D, will request orthopedics consult -MRI without osteomyelitis -Continue vancomycin and ceftriaxone and Flagyl -Follow-up blood cultures  Uncontrolled type 2 diabetes mellitus -Hemoglobin A1c is 13.9 unfortunately, was recently started on Toujeo and unable to afford this, missed all insulin for at least 2 weeks which likely caused a spike in A1c -Continue Levemir, sliding scale, titrate insulin depending on CBGs -Consult diabetes coordinator  CKD stage III -Baseline creatinine around 1.6 in 07/2018, then close to baseline at 1.8 now -Caution with vancomycin dosing  Essential hypertension -Losartan on hold -Stable, monitor  History of sarcoidosis -Follow-up with pulmonary   DVT prophylaxis: Lovenox Code Status: Full code Family Communication: No family at bedside Disposition Plan: Home pending above work-up  Consultants:   Orthopedics Dr. Ninfa Linden   Procedures:   Antimicrobials:     Subjective: -Feels a little better after starting antibiotics, still some drainage  Objective: Vitals:   10/19/18 1727 10/19/18 2320 10/20/18 0327 10/20/18 0948  BP: (!) 125/54 (!) 121/49 (!) 146/64 138/62  Pulse: 62 61 63 61  Resp: 16     Temp: 98.5 F (36.9 C) 98.2 F (36.8 C) 97.7 F (36.5 C) 98.1 F (36.7 C)  TempSrc: Oral Oral Oral Oral  SpO2: 100% 100% 100% 100%  Weight:      Height:        Intake/Output Summary (Last 24 hours) at 10/20/2018 1137 Last data filed at 10/20/2018 1118 Gross per 24 hour  Intake 1133.33 ml  Output -  Net 1133.33 ml   Filed Weights   10/19/18 1201  Weight: 73.5 kg    Examination:  General exam: Appears calm and comfortable, no distress Respiratory system: Clear to auscultation. Respiratory effort normal. Cardiovascular system: S1 & S2 heard, RRR. No JVD, murmurs, rubs, gallops Gastrointestinal system: Abdomen is nondistended, soft and nontender.Normal bowel sounds heard. Central nervous system: Alert and oriented. No focal neurological deficits. Extremities: Left foot with swelling, small wound on the lateral aspect with minimal purulent discharge. Skin: As above Psychiatry: Judgement and insight appear normal. Mood & affect appropriate.     Data Reviewed:   CBC: Recent Labs  Lab 10/19/18 1242 10/20/18 0623  WBC 7.2 4.0  NEUTROABS 5.6  --   HGB 11.4* 9.8*  HCT 37.6* 32.4*  MCV 66.3* 65.7*  PLT PLATELET CLUMPS NOTED ON SMEAR, COUNT APPEARS ADEQUATE PLATELET CLUMPS NOTED ON SMEAR, COUNT APPEARS ADEQUATE   Basic Metabolic Panel: Recent Labs  Lab 10/19/18 1242 10/20/18 7371  NA 133* 137  K 4.2 4.3  CL 96* 101  CO2 28 24  GLUCOSE 296* 186*  BUN 37* 32*  CREATININE 1.90* 1.81*  CALCIUM 9.7 8.7*   GFR: Estimated Creatinine Clearance: 34.8 mL/min (A) (by C-G formula based on SCr of 1.81 mg/dL (H)). Liver Function Tests: Recent Labs  Lab 10/19/18 1242  AST 19  ALT 21  ALKPHOS 109  BILITOT 1.0  PROT 7.8   ALBUMIN 3.5   No results for input(s): LIPASE, AMYLASE in the last 168 hours. No results for input(s): AMMONIA in the last 168 hours. Coagulation Profile: No results for input(s): INR, PROTIME in the last 168 hours. Cardiac Enzymes: No results for input(s): CKTOTAL, CKMB, CKMBINDEX, TROPONINI in the last 168 hours. BNP (last 3 results) No results for input(s): PROBNP in the last 8760 hours. HbA1C: Recent Labs    10/19/18 1945  HGBA1C 13.9*   CBG: Recent Labs  Lab 10/19/18 1345 10/19/18 1751 10/19/18 2249 10/20/18 0743 10/20/18 1116  GLUCAP 306* 252* 233* 136* 87   Lipid Profile: No results for input(s): CHOL, HDL, LDLCALC, TRIG, CHOLHDL, LDLDIRECT in the last 72 hours. Thyroid Function Tests: No results for input(s): TSH, T4TOTAL, FREET4, T3FREE, THYROIDAB in the last 72 hours. Anemia Panel: No results for input(s): VITAMINB12, FOLATE, FERRITIN, TIBC, IRON, RETICCTPCT in the last 72 hours. Urine analysis:    Component Value Date/Time   COLORURINE YELLOW 09/03/2015 1505   APPEARANCEUR CLEAR 09/03/2015 1505   LABSPEC 1.035 (H) 09/03/2015 1505   PHURINE 6.0 09/03/2015 1505   GLUCOSEU >1000 (A) 09/03/2015 1505   HGBUR NEGATIVE 09/03/2015 1505   HGBUR negative 08/19/2008 1530   BILIRUBINUR NEGATIVE 09/03/2015 1505   KETONESUR NEGATIVE 09/03/2015 1505   PROTEINUR NEGATIVE 09/03/2015 1505   UROBILINOGEN 0.2 04/08/2015 0456   NITRITE NEGATIVE 09/03/2015 1505   LEUKOCYTESUR NEGATIVE 09/03/2015 1505   Sepsis Labs: @LABRCNTIP (procalcitonin:4,lacticidven:4)  ) Recent Results (from the past 240 hour(s))  Culture, blood (routine x 2)     Status: None (Preliminary result)   Collection Time: 10/19/18 12:18 PM  Result Value Ref Range Status   Specimen Description BLOOD SITE NOT SPECIFIED  Final   Special Requests   Final    BOTTLES DRAWN AEROBIC AND ANAEROBIC Blood Culture adequate volume   Culture   Final    NO GROWTH < 24 HOURS Performed at [facility],  [place] [address] [place], [place] [address]    Report Status PENDING  Incomplete  Culture, blood (routine x 2)     Status: None (Preliminary result)   Collection Time: 10/19/18  2:32 PM  Result Value Ref Range Status   Specimen Description BLOOD LEFT FOREARM  Final   Special Requests   Final    AEROBIC BOTTLE ONLY Blood Culture results may not be optimal due to an inadequate volume of blood received in culture bottles   Culture   Final    NO GROWTH < 24 HOURS Performed at [facility], [place] [address], [place], [place] [address]    Report Status PENDING  Incomplete         Radiology Studies: Dg Chest 2 View  Result Date: 10/19/2018 CLINICAL DATA:  Chills, fever EXAM: CHEST - 2 VIEW COMPARISON:  02/09/2018 chest radiograph. FINDINGS: Stable cardiomediastinal silhouette with normal heart size. No pneumothorax. No pleural effusion. Lungs appear clear, with no acute consolidative airspace disease and no pulmonary edema. IMPRESSION: No active cardiopulmonary disease. Electronically Signed   By: Ilona Sorrel  M.D.   On: 10/19/2018 13:39   Mri Left Foot Without Contrast  Result Date: 10/20/2018 CLINICAL DATA:  Lateral foot wound.  Fever and chills. EXAM: MRI OF THE LEFT FOOT WITHOUT CONTRAST TECHNIQUE: Multiplanar, multisequence MR imaging of the left foot was performed. No intravenous contrast was administered. COMPARISON:  Left foot x-rays from yesterday. FINDINGS: Bones/Joint/Cartilage Prominent marrow edema and dull vein the midfoot and proximal second through fifth metatarsals with predominantly subchondral osseous destruction and fragmentation. There is collapse of the midfoot. No acute fracture. Mild first MTP joint osteoarthritis. Ligaments Collateral ligaments are intact. Lisfranc ligament appears torn. Anterior and posterior tibiofibular, anterior posterior talofibular, calcaneofibular, and deltoid ligaments are grossly intact. Muscles and Tendons Flexor, peroneal and extensor compartment  tendons are intact. Increased T2 signal within the intrinsic muscles of the forefoot, nonspecific, but likely related to diabetic muscle changes. Soft tissue Moderate diffuse soft tissue swelling. Small superficial skin blister along the lateral midfoot at the base of the fifth metatarsal. No underlying fluid collection or sinus tract. The underlying subcutaneous fat is relatively preserved. IMPRESSION: 1. Findings most favored to reflect active neuropathic arthropathy of the midfoot. 2. Superimposed osteomyelitis is unlikely given lack of discrete ulcer or sinus tract. There is a small superficial skin blister along the lateral midfoot at the base of the fifth metatarsal with preserved underlying subcutaneous fat. Electronically Signed   By: Titus Dubin M.D.   On: 10/20/2018 09:59   Dg Foot Complete Left  Result Date: 10/19/2018 CLINICAL DATA:  Open sore lateral left foot.  Chills.  Fever. EXAM: LEFT FOOT - COMPLETE 3+ VIEW COMPARISON:  None. FINDINGS: Diffuse left foot soft tissue swelling, most prominent in the mid foot. No fracture or dislocation. Extensive hypertrophic degenerative and erosive change at the tarsometatarsal joints, most prominent at the second through fourth tarsometatarsal joints. No suspicious focal osseous lesions. No radiopaque foreign body. Vascular calcifications throughout the soft tissues. Small plantar left calcaneal spur. IMPRESSION: Diffuse left foot soft tissue swelling, most prominent in the mid foot. Extensive hypertrophic degenerative and erosive changes at the tarsometatarsal joints, most prominent at the second through fourth tarsometatarsal joints. Differential includes Charcot joint changes of diabetes mellitus versus osteomyelitis. Further evaluation with MRI of the left foot may be obtained as clinically warranted. Electronically Signed   By: Ilona Sorrel M.D.   On: 10/19/2018 13:42   Vas [place] Lower Extremity Venous (dvt) (only [facility])  Result Date: 10/19/2018   Lower Venous Study Indications: Swelling, Erythema, and fever, ulcer on lateral left foot.  Limitations: Poor ultrasound/tissue interface. Performing Technologist: Maudry Mayhew MHA, RDMS, RVT, RDCS  Examination Guidelines: A complete evaluation includes B-mode imaging, spectral Doppler, color Doppler, and power Doppler as needed of all accessible portions of each vessel. Bilateral testing is considered an integral part of a complete examination. Limited examinations for reoccurring indications may be performed as noted.  Right Venous Findings: +---+---------------+---------+-----------+----------+--------------+    CompressibilityPhasicitySpontaneityPropertiesSummary        +---+---------------+---------+-----------+----------+--------------+ CFV                                             Not visualized +---+---------------+---------+-----------+----------+--------------+  Left Venous Findings: +---------+---------------+---------+-----------+----------+--------------+          CompressibilityPhasicitySpontaneityPropertiesSummary        +---------+---------------+---------+-----------+----------+--------------+ CFV      Full  Yes      Yes                                 +---------+---------------+---------+-----------+----------+--------------+ SFJ      Full                                                        +---------+---------------+---------+-----------+----------+--------------+ FV Prox  Full                                                        +---------+---------------+---------+-----------+----------+--------------+ FV Mid   Full                                                        +---------+---------------+---------+-----------+----------+--------------+ FV DistalFull                                                        +---------+---------------+---------+-----------+----------+--------------+ PFV      Full                                                         +---------+---------------+---------+-----------+----------+--------------+ POP      Full           Yes      Yes                                 +---------+---------------+---------+-----------+----------+--------------+ PTV      Full                                                        +---------+---------------+---------+-----------+----------+--------------+ PERO                                                  Not visualized +---------+---------------+---------+-----------+----------+--------------+    Summary: Left: There is no evidence of deep vein thrombosis in the lower extremity. However, portions of this examination were limited- see technologist comments above. No cystic structure found in the popliteal fossa.  *See table(s) above for measurements and observations. Electronically signed by Monica Martinez MD on 10/19/2018 at 5:57:24 PM.    Final         Scheduled Meds: . aspirin  81 mg Oral Daily  . atorvastatin  20  mg Oral q1800  . enoxaparin (LOVENOX) injection  40 mg Subcutaneous Q24H  . gabapentin  300 mg Oral TID  . insulin aspart  0-15 Units Subcutaneous TID WC  . insulin aspart  0-5 Units Subcutaneous QHS  . insulin detemir  10 Units Subcutaneous QHS  . metroNIDAZOLE  500 mg Oral Q8H  . pantoprazole  40 mg Oral Daily  . sodium chloride flush  3 mL Intravenous Q12H   Continuous Infusions: . cefTRIAXone (ROCEPHIN)  IV 2 g (10/19/18 2021)  . [START ON 10/21/2018] vancomycin       LOS: 1 day    Time spent: 37min    Domenic Polite, MD [facility]  10/20/2018, 11:37 AM

## 2018-10-21 LAB — BASIC METABOLIC PANEL
Anion gap: 7 (ref 5–15)
BUN: 25 mg/dL — ABNORMAL HIGH (ref 8–23)
CALCIUM: 8.3 mg/dL — AB (ref 8.9–10.3)
CO2: 24 mmol/L (ref 22–32)
Chloride: 105 mmol/L (ref 98–111)
Creatinine, Ser: 1.62 mg/dL — ABNORMAL HIGH (ref 0.61–1.24)
GFR calc Af Amer: 49 mL/min — ABNORMAL LOW (ref >60)
GFR, EST NON AFRICAN AMERICAN: 43 mL/min — AB (ref >60)
Glucose, Bld: 173 mg/dL — ABNORMAL HIGH (ref 70–99)
Potassium: 4.1 mmol/L (ref 3.5–5.1)
Sodium: 136 mmol/L (ref 135–145)

## 2018-10-21 LAB — CBC
HCT: 30.5 % — ABNORMAL LOW (ref 39.0–52.0)
Hemoglobin: 9.2 g/dL — ABNORMAL LOW (ref 13.0–17.0)
MCH: 19.7 pg — ABNORMAL LOW (ref 26.0–34.0)
MCHC: 30.2 g/dL (ref 30.0–36.0)
MCV: 65.3 fL — ABNORMAL LOW (ref 80.0–100.0)
NRBC: 0 % (ref 0.0–0.2)
Platelets: 192 10*3/uL (ref 150–400)
RBC: 4.67 MIL/uL (ref 4.22–5.81)
RDW: 15.3 % (ref 11.5–15.5)
WBC: 3.2 10*3/uL — ABNORMAL LOW (ref 4.0–10.5)

## 2018-10-21 LAB — GLUCOSE, CAPILLARY
Glucose-Capillary: 140 mg/dL — ABNORMAL HIGH (ref 70–99)
Glucose-Capillary: 130 mg/dL — ABNORMAL HIGH (ref 70–99)
Glucose-Capillary: 232 mg/dL — ABNORMAL HIGH (ref 70–99)

## 2018-10-21 MED ORDER — POLYETHYLENE GLYCOL 3350 17 G PO PACK
17.0000 g | PACK | Freq: Two times a day (BID) | ORAL | Status: DC
  Administered 2018-10-21 – 2018-10-23 (×5): 17 g via ORAL
  Filled 2018-10-21 (×7): qty 1

## 2018-10-21 MED ORDER — SENNOSIDES-DOCUSATE SODIUM 8.6-50 MG PO TABS
1.0000 | ORAL_TABLET | Freq: Two times a day (BID) | ORAL | Status: DC
  Administered 2018-10-21 – 2018-10-24 (×7): 1 via ORAL
  Filled 2018-10-21 (×7): qty 1

## 2018-10-21 MED ORDER — VANCOMYCIN HCL IN DEXTROSE 1-5 GM/200ML-% IV SOLN
1000.0000 mg | INTRAVENOUS | Status: DC
  Administered 2018-10-21 – 2018-10-22 (×2): 1000 mg via INTRAVENOUS
  Filled 2018-10-21 (×3): qty 200

## 2018-10-21 NOTE — Progress Notes (Signed)
Patient ID: [name], male   DOB: Apr 12, 1951, 68 y.o.   MRN: [id number] No acute changes.  His vitals are stable an his WBC normal.  His left foot exam is unchanged from yesterday.  Only a small amount of drainage on the bandage.  Still very tender over the lateral aspect of his 5th metatarsal.  Should continue antibiotics for now.  The patient has had remote surgery on his other foot be Dr. [name] in the past.  That foot looks great.  I'll reach out to Dr. [name] to see the patient.  No urgent need for surgery given the MRI findings showing no abscess or osteo.  Likely a blister that may need a more formal I&D.  The patient will not tolerate this at the beside.

## 2018-10-21 NOTE — Progress Notes (Signed)
Pharmacy Antibiotic Note  [name] is a 68 y.o. male admitted on 10/19/2018 with osteo from diabetic wound infection.  Pharmacy has been consulted for Vanco dosing.  ID: Osteomyelitis from Left diabetic foot wound. No need for surgery, may need I&D. Tmax 99.4, WBC 3.2,  CRP 9.9, sed rate 52, SCR 1.62 down   Vanc 3/6>> Rocephin 3/6>> Zosyn 3/6 Flagyl 3/6>>  3/6 BCx: ngtd  3/8: Vancomycin 1000 mg IV Q 24 hrs. Goal AUC 400-550. Expected AUC: 515.8 SCr used: 1.62  Plan: Change Vancomycin to 1g IV q 24h Level at steady state if continued    Height: 5\' 6"  (167.6 cm) Weight: 162 lb (73.5 kg) IBW/kg (Calculated) : 63.8  Temp (24hrs), Avg:98.7 F (37.1 C), Min:98.1 F (36.7 C), Max:99.4 F (37.4 C)  Recent Labs  Lab 10/19/18 1242 10/20/18 0623 10/21/18 0607  WBC 7.2 4.0 3.2*  CREATININE 1.90* 1.81* 1.62*  LATICACIDVEN 1.3  --   --     Estimated Creatinine Clearance: 38.8 mL/min (A) (by C-G formula based on SCr of 1.62 mg/dL (H)).    Allergies  Allergen Reactions  . Ace Inhibitors Itching and Cough  . Naproxen Anaphylaxis, Shortness Of Breath and Other (See Comments)    Throat swells. cannot breathe, and causes GI distress   [name] S. Alford [place], PharmD, BCPS Clinical Staff Pharmacist Eilene Ghazi Stillinger 10/21/2018 8:40 AM

## 2018-10-21 NOTE — Plan of Care (Signed)

## 2018-10-21 NOTE — Progress Notes (Signed)
PROGRESS NOTE    [name]  YCX:[id number] DOB: January 20, 1951 DOA: 10/19/2018 PCP: [name], MD  Brief Narrative: 68 year old male with history of prostate cancer status post prostatectomy, type 2 diabetes mellitus, hypertension GERD presented to the emergency room with fever and chills.  He had been having swelling discomfort and some drainage from the lateral aspect of his left foot for 2 to 3 weeks.  In addition also was not able to afford his insulin and hence had not been taking it for 2 weeks and just recently got his supplies. -In the emergency room he was noted to have swelling in his left lateral foot with small wound and purulent drainage.  MRI was done which was notable for soft tissue swelling, no osteomyelitis he was started on broad-spectrum antibiotics   Assessment & Plan:   Diabetic foot infection -cellulitis with small wound on the lateral aspect of the foot with some purulent discharge -Appreciate ortho consult -MRI without osteomyelitis -Continue vancomycin and ceftriaxone and Flagyl -Dr.Duda to eval in am  Uncontrolled type 2 diabetes mellitus -Hemoglobin A1c is 13.9 unfortunately, was recently started on Toujeo and unable to afford this, missed all insulin for at least 2 weeks which likely caused a spike in A1c -Continue Levemir, cbgs stable now -diabetes coordinator consulted  Presumed CAD -recent abnormal stress test -Cardiology left a message for patient over the weekend for left heart cath to be scheduled as outpatient -Stable, continue aspirin and statin, heart rate in the 50s unable to add beta-blocker  CKD stage III -Baseline creatinine around 1.6 in 07/2018, then close to baseline at 1.8 now -Caution with vancomycin dosing  Essential hypertension -Losartan on hold -Stable, monitor  History of sarcoidosis -Follow-up with pulmonary   DVT prophylaxis: Lovenox Code Status: Full code Family Communication: No family at bedside Disposition  Plan: Home pending above work-up  Consultants:   Orthopedics Dr. Ninfa Linden   Procedures:   Antimicrobials:    Subjective: -Feels okay, no chest pain or dyspnea, mild discomfort in the left foot  Objective: Vitals:   10/20/18 0948 10/20/18 1354 10/20/18 2018 10/21/18 0406  BP: 138/62 (!) 130/53 (!) 150/60 (!) 123/59  Pulse: 61 64 67 (!) 59  Resp:    16  Temp: 98.1 F (36.7 C) 98.3 F (36.8 C) 99.4 F (37.4 C) 98.9 F (37.2 C)  TempSrc: Oral Oral Oral Oral  SpO2: 100% 100% 100% 97%  Weight:      Height:        Intake/Output Summary (Last 24 hours) at 10/21/2018 1047 Last data filed at 10/20/2018 1653 Gross per 24 hour  Intake 480 ml  Output 1 ml  Net 479 ml   Filed Weights   10/19/18 1201  Weight: 73.5 kg    Examination:  Gen: Awake, Alert, Oriented X 3, no distress HEENT: PERRLA, Neck supple, no JVD Lungs: Good air movement bilaterally, CTAB CVS: RRR,No Gallops,Rubs or new Murmurs Abd: soft, Non tender, non distended, BS present Extremities: Left foot with swelling, small wound on the lateral aspect with minimal purulent discharge. Skin: As above Psychiatry: Judgement and insight appear normal. Mood & affect appropriate.     Data Reviewed:   CBC: Recent Labs  Lab 10/19/18 1242 10/20/18 0623 10/21/18 0607  WBC 7.2 4.0 3.2*  NEUTROABS 5.6  --   --   HGB 11.4* 9.8* 9.2*  HCT 37.6* 32.4* 30.5*  MCV 66.3* 65.7* 65.3*  PLT PLATELET CLUMPS NOTED ON SMEAR, COUNT APPEARS ADEQUATE PLATELET CLUMPS NOTED  ON SMEAR, COUNT APPEARS ADEQUATE 378   Basic Metabolic Panel: Recent Labs  Lab 10/19/18 1242 10/20/18 0623 10/21/18 0607  NA 133* 137 136  K 4.2 4.3 4.1  CL 96* 101 105  CO2 28 24 24   GLUCOSE 296* 186* 173*  BUN 37* 32* 25*  CREATININE 1.90* 1.81* 1.62*  CALCIUM 9.7 8.7* 8.3*   GFR: Estimated Creatinine Clearance: 38.8 mL/min (A) (by C-G formula based on SCr of 1.62 mg/dL (H)). Liver Function Tests: Recent Labs  Lab 10/19/18 1242  AST 19    ALT 21  ALKPHOS 109  BILITOT 1.0  PROT 7.8  ALBUMIN 3.5   No results for input(s): LIPASE, AMYLASE in the last 168 hours. No results for input(s): AMMONIA in the last 168 hours. Coagulation Profile: No results for input(s): INR, PROTIME in the last 168 hours. Cardiac Enzymes: No results for input(s): CKTOTAL, CKMB, CKMBINDEX, TROPONINI in the last 168 hours. BNP (last 3 results) No results for input(s): PROBNP in the last 8760 hours. HbA1C: Recent Labs    10/19/18 1945  HGBA1C 13.9*   CBG: Recent Labs  Lab 10/20/18 0743 10/20/18 1116 10/20/18 1629 10/20/18 2030 10/21/18 0819  GLUCAP 136* 87 90 148* 140*   Lipid Profile: No results for input(s): CHOL, HDL, LDLCALC, TRIG, CHOLHDL, LDLDIRECT in the last 72 hours. Thyroid Function Tests: No results for input(s): TSH, T4TOTAL, FREET4, T3FREE, THYROIDAB in the last 72 hours. Anemia Panel: No results for input(s): VITAMINB12, FOLATE, FERRITIN, TIBC, IRON, RETICCTPCT in the last 72 hours. Urine analysis:    Component Value Date/Time   COLORURINE YELLOW 09/03/2015 1505   APPEARANCEUR CLEAR 09/03/2015 1505   LABSPEC 1.035 (H) 09/03/2015 1505   PHURINE 6.0 09/03/2015 1505   GLUCOSEU >1000 (A) 09/03/2015 1505   HGBUR NEGATIVE 09/03/2015 1505   HGBUR negative 08/19/2008 1530   BILIRUBINUR NEGATIVE 09/03/2015 1505   KETONESUR NEGATIVE 09/03/2015 1505   PROTEINUR NEGATIVE 09/03/2015 1505   UROBILINOGEN 0.2 04/08/2015 0456   NITRITE NEGATIVE 09/03/2015 1505   LEUKOCYTESUR NEGATIVE 09/03/2015 1505   Sepsis Labs: @LABRCNTIP (procalcitonin:4,lacticidven:4)  ) Recent Results (from the past 240 hour(s))  Culture, blood (routine x 2)     Status: None (Preliminary result)   Collection Time: 10/19/18 12:18 PM  Result Value Ref Range Status   Specimen Description BLOOD SITE NOT SPECIFIED  Final   Special Requests   Final    BOTTLES DRAWN AEROBIC AND ANAEROBIC Blood Culture adequate volume   Culture   Final    NO GROWTH 2  DAYS Performed at [facility], [place] [address] [place], [place] [address]    Report Status PENDING  Incomplete  Culture, blood (routine x 2)     Status: None (Preliminary result)   Collection Time: 10/19/18  2:32 PM  Result Value Ref Range Status   Specimen Description BLOOD LEFT FOREARM  Final   Special Requests   Final    AEROBIC BOTTLE ONLY Blood Culture results may not be optimal due to an inadequate volume of blood received in culture bottles   Culture   Final    NO GROWTH 2 DAYS Performed at [facility], [place] [address], [place], [place] [address]    Report Status PENDING  Incomplete         Radiology Studies: Dg Chest 2 View  Result Date: 10/19/2018 CLINICAL DATA:  Chills, fever EXAM: CHEST - 2 VIEW COMPARISON:  02/09/2018 chest radiograph. FINDINGS: Stable cardiomediastinal silhouette with normal heart size. No pneumothorax.  No pleural effusion. Lungs appear clear, with no acute consolidative airspace disease and no pulmonary edema. IMPRESSION: No active cardiopulmonary disease. Electronically Signed   By: Ilona Sorrel M.D.   On: 10/19/2018 13:39   Mri Left Foot Without Contrast  Result Date: 10/20/2018 CLINICAL DATA:  Lateral foot wound.  Fever and chills. EXAM: MRI OF THE LEFT FOOT WITHOUT CONTRAST TECHNIQUE: Multiplanar, multisequence MR imaging of the left foot was performed. No intravenous contrast was administered. COMPARISON:  Left foot x-rays from yesterday. FINDINGS: Bones/Joint/Cartilage Prominent marrow edema and dull vein the midfoot and proximal second through fifth metatarsals with predominantly subchondral osseous destruction and fragmentation. There is collapse of the midfoot. No acute fracture. Mild first MTP joint osteoarthritis. Ligaments Collateral ligaments are intact. Lisfranc ligament appears torn. Anterior and posterior tibiofibular, anterior posterior talofibular, calcaneofibular, and deltoid ligaments are grossly intact. Muscles and Tendons  Flexor, peroneal and extensor compartment tendons are intact. Increased T2 signal within the intrinsic muscles of the forefoot, nonspecific, but likely related to diabetic muscle changes. Soft tissue Moderate diffuse soft tissue swelling. Small superficial skin blister along the lateral midfoot at the base of the fifth metatarsal. No underlying fluid collection or sinus tract. The underlying subcutaneous fat is relatively preserved. IMPRESSION: 1. Findings most favored to reflect active neuropathic arthropathy of the midfoot. 2. Superimposed osteomyelitis is unlikely given lack of discrete ulcer or sinus tract. There is a small superficial skin blister along the lateral midfoot at the base of the fifth metatarsal with preserved underlying subcutaneous fat. Electronically Signed   By: Titus Dubin M.D.   On: 10/20/2018 09:59   Dg Foot Complete Left  Result Date: 10/19/2018 CLINICAL DATA:  Open sore lateral left foot.  Chills.  Fever. EXAM: LEFT FOOT - COMPLETE 3+ VIEW COMPARISON:  None. FINDINGS: Diffuse left foot soft tissue swelling, most prominent in the mid foot. No fracture or dislocation. Extensive hypertrophic degenerative and erosive change at the tarsometatarsal joints, most prominent at the second through fourth tarsometatarsal joints. No suspicious focal osseous lesions. No radiopaque foreign body. Vascular calcifications throughout the soft tissues. Small plantar left calcaneal spur. IMPRESSION: Diffuse left foot soft tissue swelling, most prominent in the mid foot. Extensive hypertrophic degenerative and erosive changes at the tarsometatarsal joints, most prominent at the second through fourth tarsometatarsal joints. Differential includes Charcot joint changes of diabetes mellitus versus osteomyelitis. Further evaluation with MRI of the left foot may be obtained as clinically warranted. Electronically Signed   By: Ilona Sorrel M.D.   On: 10/19/2018 13:42   Vas [place] Lower Extremity Venous (dvt)  (only [facility])  Result Date: 10/19/2018  Lower Venous Study Indications: Swelling, Erythema, and fever, ulcer on lateral left foot.  Limitations: Poor ultrasound/tissue interface. Performing Technologist: Maudry Mayhew MHA, RDMS, RVT, RDCS  Examination Guidelines: A complete evaluation includes B-mode imaging, spectral Doppler, color Doppler, and power Doppler as needed of all accessible portions of each vessel. Bilateral testing is considered an integral part of a complete examination. Limited examinations for reoccurring indications may be performed as noted.  Right Venous Findings: +---+---------------+---------+-----------+----------+--------------+    CompressibilityPhasicitySpontaneityPropertiesSummary        +---+---------------+---------+-----------+----------+--------------+ CFV                                             Not visualized +---+---------------+---------+-----------+----------+--------------+  Left Venous Findings: +---------+---------------+---------+-----------+----------+--------------+  CompressibilityPhasicitySpontaneityPropertiesSummary        +---------+---------------+---------+-----------+----------+--------------+ CFV      Full           Yes      Yes                                 +---------+---------------+---------+-----------+----------+--------------+ SFJ      Full                                                        +---------+---------------+---------+-----------+----------+--------------+ FV Prox  Full                                                        +---------+---------------+---------+-----------+----------+--------------+ FV Mid   Full                                                        +---------+---------------+---------+-----------+----------+--------------+ FV DistalFull                                                         +---------+---------------+---------+-----------+----------+--------------+ PFV      Full                                                        +---------+---------------+---------+-----------+----------+--------------+ POP      Full           Yes      Yes                                 +---------+---------------+---------+-----------+----------+--------------+ PTV      Full                                                        +---------+---------------+---------+-----------+----------+--------------+ PERO                                                  Not visualized +---------+---------------+---------+-----------+----------+--------------+    Summary: Left: There is no evidence of deep vein thrombosis in the lower extremity. However, portions of this examination were limited- see technologist comments above. No cystic structure found in the popliteal fossa.  *See table(s) above for measurements and observations. Electronically signed by Monica Martinez MD on 10/19/2018 at 5:57:24 PM.  Final         Scheduled Meds: . aspirin  81 mg Oral Daily  . atorvastatin  20 mg Oral q1800  . enoxaparin (LOVENOX) injection  40 mg Subcutaneous Q24H  . feeding supplement (GLUCERNA SHAKE)  237 mL Oral BID BM  . gabapentin  300 mg Oral TID  . insulin aspart  0-15 Units Subcutaneous TID WC  . insulin aspart  0-5 Units Subcutaneous QHS  . insulin detemir  10 Units Subcutaneous QHS  . metroNIDAZOLE  500 mg Oral Q8H  . pantoprazole  40 mg Oral Daily  . polyethylene glycol  17 g Oral BID  . senna-docusate  1 tablet Oral BID  . sodium chloride flush  3 mL Intravenous Q12H   Continuous Infusions: . cefTRIAXone (ROCEPHIN)  IV 2 g (10/20/18 2020)  . vancomycin 1,000 mg (10/21/18 1028)     LOS: 2 days    Time spent: 80min    Domenic Polite, MD [facility]  10/21/2018, 10:47 AM

## 2018-10-22 ENCOUNTER — Inpatient Hospital Stay (HOSPITAL_COMMUNITY): Payer: Medicare Other

## 2018-10-22 DIAGNOSIS — E11621 Type 2 diabetes mellitus with foot ulcer: Secondary | ICD-10-CM

## 2018-10-22 DIAGNOSIS — L97509 Non-pressure chronic ulcer of other part of unspecified foot with unspecified severity: Secondary | ICD-10-CM

## 2018-10-22 LAB — GLUCOSE, CAPILLARY
Glucose-Capillary: 163 mg/dL — ABNORMAL HIGH (ref 70–99)
Glucose-Capillary: 138 mg/dL — ABNORMAL HIGH (ref 70–99)
Glucose-Capillary: 152 mg/dL — ABNORMAL HIGH (ref 70–99)
Glucose-Capillary: 153 mg/dL — ABNORMAL HIGH (ref 70–99)
Glucose-Capillary: 137 mg/dL — ABNORMAL HIGH (ref 70–99)

## 2018-10-22 NOTE — Progress Notes (Signed)
Inpatient Diabetes Program Recommendations  AACE/ADA: New Consensus Statement on Inpatient Glycemic Control (2015)  Target Ranges:  Prepandial:   less than 140 mg/dL      Peak postprandial:   less than 180 mg/dL (1-2 hours)      Critically ill patients:  140 - 180 mg/dL   Lab Results  Component Value Date   GLUCAP 152 (H) 10/22/2018   HGBA1C 13.9 (H) 10/19/2018    Review of Glycemic Control Results for [name], [name] (MRN [id number]) as of 10/22/2018 14:44  Ref. Range 10/21/2018 16:35 10/21/2018 22:18 10/22/2018 08:40 10/22/2018 13:01  Glucose-Capillary Latest Ref Range: 70 - 99 mg/dL 130 (H) 232 (H) 138 (H) 152 (H)   Diabetes history: Type 2 DM Outpatient Diabetes medications: Trulicity 9.44 mg Q week, Toujeo 50 units QD Current orders for Inpatient glycemic control: Levemir 10 units QHS, Novolog 0-15 units TID, Novolog 0-5 units QHS  Inpatient Diabetes Program Recommendations:    Spoke with patient and wife at length regarding outpatient management of DM. Patient reports that PCP recently changed his insulin to [facility] because he was missing doses, however this was too costly for him to continue long term. Currently, patient has a one month supply his wife just purchased, however he will need a better long term plan for management. Wife states, "I would try to remind him and he is stubborn and wouldn't do it."   Reviewed patient's current A1c of 13.9%. Explained what a A1c is and what it measures. Also reviewed goal A1c with patient, importance of good glucose control @ home, and blood sugar goals. Reviewed patho of DM, need for insulin, importance of not missing doses, current inpatient insulin needs, vascular changes, impact to risk for infection and comorbidites of poor glycemic control.  Patient will need a meter at discharge. Blood glucose kit (inlcudes lancets and strips) ([id number]). Encouraged patient to begin checking 2- 3 times per day. Admits to not checking due to being "too busy,  however, now I got to start." Patient did state that he would get really dizzy and experience symptoms of hypoglycemia, however, patient never would have his meter, nor did he ever check. He just would not always take the insulin. Of note, patient was on Toujeo 50 units QD at home, compared to Levemir 10 units QHS. Would not recommend discharging patient home on Toujeo 50 units QD due to high probability of hypoglycemia given current inpatient needs. Discussed Relion products as an option once Toujeo has been completed (patient is wanting to use). Also, discussed in detail when to call MD for adjustments, survival skills were reviewed and differences between 70/30, Toujeo and Lantus. Based off of affordability, the patient is open to the idea of changing his insulin. He will plan to reach out to his PCP and make an appointment.  We will continue to follow for DC plan.   Thanks, Bronson Curb, MSN, RNC-OB Diabetes Coordinator [phone] (8a-5p)

## 2018-10-22 NOTE — Consult Note (Signed)
[place] Nurse wound consult note Patient receiving care in [facility].  No family present. Reason for Consult: Left foot wound Wound type: DFU Measurement: There is a large, discolored area on the left lateral foot that is yellow, which I presume to be the area that has been draining fluid for 2 weeks.  Today the gauze and kerlex dressing has serous drainage over the yellow discolored area.  It is very tender to light tough.  Over the gauze and kerlex there was an Ace wrap.  The left lower leg is tight and hard with edema and has erythema. I see from the preliminary ABI study from today both ABIs are abnormally low. Wound bed: Yellow "pocket" from which drainage is oozing.  Surrounding tissue is discolored a purple/black hue. Drainage (amount, consistency, odor) No odor Dressing procedure/placement/frequency:Apply dry gauze over draining area on left lateral foot. Secure with Kerlex. Change daily. Monitor the wound area(s) for worsening of condition such as: Signs/symptoms of infection,  Increase in size,  Development of or worsening of odor, Development of pain, or increased pain at the affected locations.  Notify the medical team if any of these develop.  Thank you for the consult.  Discussed plan of care with the patient and bedside nurse.  [place] nurse will not follow at this time.  Please re-consult the [place] team if needed.  Val Riles, RN, MSN, CWOCN, CNS-BC, pager [phone]

## 2018-10-22 NOTE — Progress Notes (Signed)
PROGRESS NOTE    [name]  EZM:[id number] DOB: 12/25/1950 DOA: 10/19/2018 PCP: [name], MD  Brief Narrative: 68 year old male with history of prostate cancer status post prostatectomy, type 2 diabetes mellitus, hypertension GERD presented to the emergency room with fever and chills.  He had been having swelling discomfort and some drainage from the lateral aspect of his left foot for 2 to 3 weeks.  In addition also was not able to afford his insulin and hence had not been taking it for 2 weeks and just recently got his supplies. -In the emergency room he was noted to have swelling in his left lateral foot with small wound and purulent drainage.  MRI was done which was notable for soft tissue swelling, no osteomyelitis he was started on broad-spectrum antibiotics   Assessment & Plan:   Diabetic foot infection -cellulitis with small wound on the lateral aspect of the foot with some purulent discharge -Appreciate ortho consult, seen by Dr.Blackman who asked Dr.Duda to follow -MRI without osteomyelitis -Skin over part of lateral plantar surface appears to be starting to slough off -Continue vancomycin and ceftriaxone and Flagyl -Dr.Duda missed pt who was in vascular today, will re-eval tomorrow  Uncontrolled type 2 diabetes mellitus -Hemoglobin A1c is 13.9 unfortunately, was recently started on Toujeo and unable to afford this, missed all insulin for at least 2 weeks which likely caused a spike in A1c -Continue Levemir, cbgs stable now -diabetes coordinator consulted -CBGs stable now  Presumed CAD -recent abnormal stress test -Cardiology left a message for patient over the weekend for left heart cath to be scheduled as outpatient -Stable, continue aspirin and statin, heart rate in the 50s unable to add beta-blocker  CKD stage III -Baseline creatinine around 1.6 in 07/2018, then close to baseline at 1.8 now -Caution with vancomycin dosing  Essential  hypertension -Losartan on hold -Stable, monitor  History of sarcoidosis -Follow-up with pulmonary   DVT prophylaxis: Lovenox Code Status: Full code Family Communication: wife at bedside Disposition Plan: Home pending above work-up  Consultants:   Orthopedics Dr. Ninfa Linden   Procedures:   Antimicrobials:    Subjective: -continues to have discomfort and drainage from foot  Objective: Vitals:   10/21/18 1350 10/21/18 1637 10/21/18 2030 10/22/18 0309  BP: (!) 166/63 (!) 155/68 (!) 128/58 (!) 116/51  Pulse: 60 61 97 61  Resp:    16  Temp: 97.8 F (36.6 C) 97.8 F (36.6 C) 98.4 F (36.9 C) 98.4 F (36.9 C)  TempSrc: Oral Oral Oral Oral  SpO2: 100% 100% 97% 98%  Weight:      Height:        Intake/Output Summary (Last 24 hours) at 10/22/2018 1434 Last data filed at 10/22/2018 0618 Gross per 24 hour  Intake 940 ml  Output -  Net 940 ml   Filed Weights   10/19/18 1201  Weight: 73.5 kg    Examination:  Gen: Awake, Alert, Oriented X 3,  HEENT: PERRLA, Neck supple, no JVD Lungs: Good air movement bilaterally, CTAB CVS: RRR,No Gallops,Rubs or new Murmurs Abd: soft, Non tender, non distended, BS present Extremities: 1 plus edema,  small wound on the lateral aspect with minimal purulent discharge, part of skin over lateral plantar aspect starting to slough off, yellow pocket, minimal drainage now Skin: As above Psychiatry: Judgement and insight appear normal. Mood & affect appropriate.     Data Reviewed:   CBC: Recent Labs  Lab 10/19/18 1242 10/20/18 0623 10/21/18 0607  WBC 7.2  4.0 3.2*  NEUTROABS 5.6  --   --   HGB 11.4* 9.8* 9.2*  HCT 37.6* 32.4* 30.5*  MCV 66.3* 65.7* 65.3*  PLT PLATELET CLUMPS NOTED ON SMEAR, COUNT APPEARS ADEQUATE PLATELET CLUMPS NOTED ON SMEAR, COUNT APPEARS ADEQUATE 622   Basic Metabolic Panel: Recent Labs  Lab 10/19/18 1242 10/20/18 0623 10/21/18 0607  NA 133* 137 136  K 4.2 4.3 4.1  CL 96* 101 105  CO2 28 24 24   GLUCOSE  296* 186* 173*  BUN 37* 32* 25*  CREATININE 1.90* 1.81* 1.62*  CALCIUM 9.7 8.7* 8.3*   GFR: Estimated Creatinine Clearance: 38.8 mL/min (A) (by C-G formula based on SCr of 1.62 mg/dL (H)). Liver Function Tests: Recent Labs  Lab 10/19/18 1242  AST 19  ALT 21  ALKPHOS 109  BILITOT 1.0  PROT 7.8  ALBUMIN 3.5   No results for input(s): LIPASE, AMYLASE in the last 168 hours. No results for input(s): AMMONIA in the last 168 hours. Coagulation Profile: No results for input(s): INR, PROTIME in the last 168 hours. Cardiac Enzymes: No results for input(s): CKTOTAL, CKMB, CKMBINDEX, TROPONINI in the last 168 hours. BNP (last 3 results) No results for input(s): PROBNP in the last 8760 hours. HbA1C: Recent Labs    10/19/18 1945  HGBA1C 13.9*   CBG: Recent Labs  Lab 10/21/18 1132 10/21/18 1635 10/21/18 2218 10/22/18 0840 10/22/18 1301  GLUCAP 163* 130* 232* 138* 152*   Lipid Profile: No results for input(s): CHOL, HDL, LDLCALC, TRIG, CHOLHDL, LDLDIRECT in the last 72 hours. Thyroid Function Tests: No results for input(s): TSH, T4TOTAL, FREET4, T3FREE, THYROIDAB in the last 72 hours. Anemia Panel: No results for input(s): VITAMINB12, FOLATE, FERRITIN, TIBC, IRON, RETICCTPCT in the last 72 hours. Urine analysis:    Component Value Date/Time   COLORURINE YELLOW 09/03/2015 1505   APPEARANCEUR CLEAR 09/03/2015 1505   LABSPEC 1.035 (H) 09/03/2015 1505   PHURINE 6.0 09/03/2015 1505   GLUCOSEU >1000 (A) 09/03/2015 1505   HGBUR NEGATIVE 09/03/2015 1505   HGBUR negative 08/19/2008 1530   BILIRUBINUR NEGATIVE 09/03/2015 1505   KETONESUR NEGATIVE 09/03/2015 1505   PROTEINUR NEGATIVE 09/03/2015 1505   UROBILINOGEN 0.2 04/08/2015 0456   NITRITE NEGATIVE 09/03/2015 1505   LEUKOCYTESUR NEGATIVE 09/03/2015 1505   Sepsis Labs: @LABRCNTIP (procalcitonin:4,lacticidven:4)  ) Recent Results (from the past 240 hour(s))  Culture, blood (routine x 2)     Status: None (Preliminary  result)   Collection Time: 10/19/18 12:18 PM  Result Value Ref Range Status   Specimen Description BLOOD SITE NOT SPECIFIED  Final   Special Requests   Final    BOTTLES DRAWN AEROBIC AND ANAEROBIC Blood Culture adequate volume   Culture   Final    NO GROWTH 3 DAYS Performed at [facility], [place] [address], [place], [place] [address]    Report Status PENDING  Incomplete  Culture, blood (routine x 2)     Status: None (Preliminary result)   Collection Time: 10/19/18  2:32 PM  Result Value Ref Range Status   Specimen Description BLOOD LEFT FOREARM  Final   Special Requests   Final    AEROBIC BOTTLE ONLY Blood Culture results may not be optimal due to an inadequate volume of blood received in culture bottles   Culture   Final    NO GROWTH 3 DAYS Performed at [facility], [place] [address] [place], [place] [address]    Report Status PENDING  Incomplete  Radiology Studies: Vas [place] Abi With/wo Tbi  Result Date: 10/22/2018 LOWER EXTREMITY DOPPLER STUDY Indications: Left foot ulcer. High Risk Factors: Diabetes.  Performing Technologist: June Leap RDMS, RVT  Examination Guidelines: A complete evaluation includes at minimum, Doppler waveform signals and systolic blood pressure reading at the level of bilateral brachial, anterior tibial, and posterior tibial arteries, when vessel segments are accessible. Bilateral testing is considered an integral part of a complete examination. Photoelectric Plethysmograph (PPG) waveforms and toe systolic pressure readings are included as required and additional duplex testing as needed. Limited examinations for reoccurring indications may be performed as noted.  ABI Findings: +---------+------------------+-----+----------+--------+ Right    Rt Pressure (mmHg)IndexWaveform  Comment  +---------+------------------+-----+----------+--------+ Brachial 182                    triphasic           +---------+------------------+-----+----------+--------+ ATA      179               0.98 monophasic         +---------+------------------+-----+----------+--------+ PTA      208               1.14 biphasic           +---------+------------------+-----+----------+--------+ Great Toe108               0.59 Abnormal           +---------+------------------+-----+----------+--------+ +---------+------------------+-----+----------+-------+ Left     Lt Pressure (mmHg)IndexWaveform  Comment +---------+------------------+-----+----------+-------+ Brachial 178                    triphasic         +---------+------------------+-----+----------+-------+ ATA      139               0.76 monophasic        +---------+------------------+-----+----------+-------+ PTA      187               1.03 monophasic        +---------+------------------+-----+----------+-------+ Great Toe42                0.23 Abnormal          +---------+------------------+-----+----------+-------+  Summary: Right: ABI is within normal limits, however is most likely falsely elevated based on abnormal waveforms. TBI is abnormal. Left: ABI is within normal limits, however is most likely falsely elevated based on abnormal waveforms. TBI is abnormal, and pressure is significantly lower than right side.  *See table(s) above for measurements and observations.    Preliminary         Scheduled Meds: . aspirin  81 mg Oral Daily  . atorvastatin  20 mg Oral q1800  . enoxaparin (LOVENOX) injection  40 mg Subcutaneous Q24H  . feeding supplement (GLUCERNA SHAKE)  237 mL Oral BID BM  . gabapentin  300 mg Oral TID  . insulin aspart  0-15 Units Subcutaneous TID WC  . insulin aspart  0-5 Units Subcutaneous QHS  . insulin detemir  10 Units Subcutaneous QHS  . metroNIDAZOLE  500 mg Oral Q8H  . pantoprazole  40 mg Oral Daily  . polyethylene glycol  17 g Oral BID  . senna-docusate  1 tablet Oral BID  . sodium  chloride flush  3 mL Intravenous Q12H   Continuous Infusions: . cefTRIAXone (ROCEPHIN)  IV 2 g (10/21/18 2049)  . vancomycin 1,000 mg (10/22/18 1000)     LOS: 3 days    Time spent:  89min    Domenic Polite, MD [facility]  10/22/2018, 2:34 PM

## 2018-10-22 NOTE — Progress Notes (Signed)
Patient ID: [name], male   DOB: 03-10-1951, 68 y.o.   MRN: [id number] I stopped by to see the patient and he is currently in the vascular lab.  I will see tomorrow morning.

## 2018-10-22 NOTE — Progress Notes (Signed)
ABI       has been completed. Preliminary results can be found under CV proc through chart review. Cassidie Veiga, BS, RDMS, RVT   

## 2018-10-23 DIAGNOSIS — E11621 Type 2 diabetes mellitus with foot ulcer: Secondary | ICD-10-CM

## 2018-10-23 DIAGNOSIS — E440 Moderate protein-calorie malnutrition: Secondary | ICD-10-CM

## 2018-10-23 DIAGNOSIS — L97421 Non-pressure chronic ulcer of left heel and midfoot limited to breakdown of skin: Secondary | ICD-10-CM

## 2018-10-23 LAB — GLUCOSE, CAPILLARY
Glucose-Capillary: 178 mg/dL — ABNORMAL HIGH (ref 70–99)
Glucose-Capillary: 216 mg/dL — ABNORMAL HIGH (ref 70–99)
Glucose-Capillary: 72 mg/dL (ref 70–99)
Glucose-Capillary: 185 mg/dL — ABNORMAL HIGH (ref 70–99)

## 2018-10-23 MED ORDER — CEFDINIR 300 MG PO CAPS
600.0000 mg | ORAL_CAPSULE | Freq: Every day | ORAL | Status: DC
  Administered 2018-10-23 – 2018-10-24 (×2): 600 mg via ORAL
  Filled 2018-10-23 (×2): qty 2

## 2018-10-23 MED ORDER — DOXYCYCLINE HYCLATE 100 MG PO TABS
100.0000 mg | ORAL_TABLET | Freq: Two times a day (BID) | ORAL | Status: DC
  Administered 2018-10-23 – 2018-10-24 (×3): 100 mg via ORAL
  Filled 2018-10-23 (×3): qty 1

## 2018-10-23 MED ORDER — SILVER SULFADIAZINE 1 % EX CREA
TOPICAL_CREAM | Freq: Every day | CUTANEOUS | Status: DC
  Administered 2018-10-23 – 2018-10-24 (×2): via TOPICAL
  Filled 2018-10-23 (×2): qty 85

## 2018-10-23 NOTE — Progress Notes (Signed)
Orthopedic Tech Progress Note Patient Details:  [name] 17-Feb-1951 [id number]  Ortho Devices Type of Ortho Device: Postop shoe/boot Ortho Device/Splint Interventions: Application   Post Interventions Patient Tolerated: Well Instructions Provided: Care of device   [name] 10/23/2018, 9:28 AM

## 2018-10-23 NOTE — Plan of Care (Signed)

## 2018-10-23 NOTE — Care Management Important Message (Signed)
Important Message  Patient Details  Name: [name] MRN: [id number] Date of Birth: May 18, 1951   Medicare Important Message Given:  Yes    Orbie Pyo 10/23/2018, 4:14 PM

## 2018-10-23 NOTE — Consult Note (Signed)
ORTHOPAEDIC CONSULTATION  REQUESTING PHYSICIAN: Domenic Polite, MD  Chief Complaint: Left foot ulceration with fever and chills.  HPI: [name] is a 68 y.o. male with diabetic insensate neuropathy who presents with ulceration plantar lateral aspect of the left foot.  Patient states that on Tuesday he was at work he had fever and chills it was painful to put weight on the left foot and patient presented for evaluation and treatment.  Past Medical History:  Diagnosis Date  . Diabetic retinopathy ([place])   . Diabetic retinopathy associated with type 2 diabetes mellitus ([place]) 09/13/2014   He works for [employer]  . Fluid overload 09/03/2012   Post op  . GERD (gastroesophageal reflux disease)   . Prostate cancer ([place])   . Visual impairment    Past Surgical History:  Procedure Laterality Date  . CATARACT EXTRACTION W/ INTRAOCULAR LENS  IMPLANT, BILATERAL    . EYE SURGERY Bilateral    "laser OR for diabetic retinopathy"  . INGUINAL HERNIA REPAIR     [name] 07/12/2010), "don't remember which side"  . LAPAROTOMY  08/23/2012   Procedure: EXPLORATORY LAPAROTOMY;  Surgeon: [name], DO;  Location: [facility] ORS;  Service: General;  Laterality: N/A;  exploratory laparotomy with lysis of adhesions  . LYSIS OF ADHESION  08/23/2012   Procedure: LYSIS OF ADHESION;  Surgeon: [name], DO;  Location: [facility] OR[facility];  Service: General;;  . ROBOT ASSISTED LAPAROSCOPIC RADICAL PROSTATECTOMY  2000's   "had to finish manually after machine broke"  . SHOULDER SURGERY  1970's   separation; from playing football"   Social History   Socioeconomic History  . Marital status: Married    Spouse name: Not on file  . Number of children: Not on file  . Years of education: Not on file  . Highest education level: Not on file  Occupational History  . Not on file  Social Needs  . Financial resource strain: Not on file  . Food insecurity:    Worry: Not on file    Inability: Not on file  .  Transportation needs:    Medical: Not on file    Non-medical: Not on file  Tobacco Use  . Smoking status: Former Smoker    Packs/day: 2.50    Years: 3.00    Pack years: 7.50    Types: Cigarettes    Last attempt to quit: 11/09/1966    Years since quitting: 51.9  . Smokeless tobacco: Never Used  Substance and Sexual Activity  . Alcohol use: Yes    Comment: 04/08/2015 "maybe a beer/ or 2 or a glass of wine monthly"  . Drug use: No  . Sexual activity: Yes  Lifestyle  . Physical activity:    Days per week: Not on file    Minutes per session: Not on file  . Stress: Not on file  Relationships  . Social connections:    Talks on phone: Not on file    Gets together: Not on file    Attends religious service: Not on file    Active member of club or organization: Not on file    Attends meetings of clubs or organizations: Not on file    Relationship status: Not on file  Other Topics Concern  . Not on file  Social History Narrative  . Not on file   Family History  Problem Relation Age of Onset  . Diabetes Mellitus II Mother   . Colon cancer Neg Hx    -  negative except otherwise stated in the family history section Allergies  Allergen Reactions  . Ace Inhibitors Itching and Cough  . Naproxen Anaphylaxis, Shortness Of Breath and Other (See Comments)    Throat swells. cannot breathe, and causes GI distress   Prior to Admission medications   Medication Sig Start Date End Date Taking? Authorizing Provider  acetaminophen (TYLENOL) 650 MG CR tablet Take 650 mg by mouth every 8 (eight) hours as needed for pain.   Yes [provider]  aspirin 81 MG chewable tablet Chew 81 mg by mouth daily.   Yes [provider]  atorvastatin (LIPITOR) 20 MG tablet Take 20 mg by mouth daily. 09/27/18  Yes [provider]  chlorthalidone (HYGROTON) 25 MG tablet Take 1 tablet (25 mg total) by mouth daily. 07/18/18  Yes [name], MD  ciprofloxacin (CILOXAN) 0.3 % ophthalmic  solution 1 drop See admin instructions. Instill 1 drop into affected eye once a day if needed ("for a scratched cornea") 02/02/18  Yes [provider]  furosemide (LASIX) 40 MG tablet Take 40 mg by mouth daily. 09/27/18  Yes [provider]  gabapentin (NEURONTIN) 300 MG capsule Take 1 capsule (300 mg total) by mouth 3 (three) times daily. 07/18/18  Yes [name], MD  Insulin Glargine, 2 Unit Dial, (TOUJEO MAX SOLOSTAR) 300 UNIT/ML SOPN Inject 50 Units into the skin daily. 07/18/18  Yes [name], MD  losartan (COZAAR) 50 MG tablet Take 50 mg by mouth daily. 09/27/18  Yes [provider]  Multiple Vitamin (MULTIVITAMIN WITH MINERALS) TABS tablet Take 1 tablet by mouth daily.   Yes [provider]  blood glucose meter kit and supplies KIT per insurance preference. Check blood glucose three times a day. Dx E11.65, Z79.4 07/18/18   [name], MD  Insulin Pen Needle (PEN NEEDLES) 32G X 6 MM MISC 1 each by Does not apply route daily. 10/17/18   [name], MD  Lancets (FREESTYLE) lancets Use as instructed 09/18/14   Delfina Redwood, MD  Lancets 30G MISC  04/06/16   [provider]   Vas [place] Abi With/wo Tbi  Result Date: 10/22/2018 LOWER EXTREMITY DOPPLER STUDY Indications: Left foot ulcer. High Risk Factors: Diabetes.  Performing Technologist: June Leap RDMS, RVT  Examination Guidelines: A complete evaluation includes at minimum, Doppler waveform signals and systolic blood pressure reading at the level of bilateral brachial, anterior tibial, and posterior tibial arteries, when vessel segments are accessible. Bilateral testing is considered an integral part of a complete examination. Photoelectric Plethysmograph (PPG) waveforms and toe systolic pressure readings are included as required and additional duplex testing as needed. Limited examinations for reoccurring indications may be performed as noted.  ABI Findings:  +---------+------------------+-----+----------+--------+ Right    Rt Pressure (mmHg)IndexWaveform  Comment  +---------+------------------+-----+----------+--------+ Brachial 182                    triphasic          +---------+------------------+-----+----------+--------+ ATA      179               0.98 monophasic         +---------+------------------+-----+----------+--------+ PTA      208               1.14 biphasic           +---------+------------------+-----+----------+--------+ Great Toe108               0.59 Abnormal           +---------+------------------+-----+----------+--------+ +---------+------------------+-----+----------+-------+  Left     Lt Pressure (mmHg)IndexWaveform  Comment +---------+------------------+-----+----------+-------+ Brachial 178                    triphasic         +---------+------------------+-----+----------+-------+ ATA      139               0.76 monophasic        +---------+------------------+-----+----------+-------+ PTA      187               1.03 monophasic        +---------+------------------+-----+----------+-------+ Great Toe42                0.23 Abnormal          +---------+------------------+-----+----------+-------+  Summary: Right: ABI is within normal limits, however is most likely falsely elevated based on abnormal waveforms. TBI is abnormal. Left: ABI is within normal limits, however is most likely falsely elevated based on abnormal waveforms. TBI is abnormal, and pressure is significantly lower than right side.  *See table(s) above for measurements and observations.  Electronically signed by Ruta Hinds MD on 10/22/2018 at 5:02:13 PM.   Final    - pertinent xrays, CT, MRI studies were reviewed and independently interpreted  Positive ROS: All other systems have been reviewed and were otherwise negative with the exception of those mentioned in the HPI and as above.  Physical Exam: General: Alert,  no acute distress Psychiatric: Patient is competent for consent with normal mood and affect Lymphatic: No axillary or cervical lymphadenopathy Cardiovascular: No pedal edema Respiratory: No cyanosis, no use of accessory musculature GI: No organomegaly, abdomen is soft and non-tender    Images:  '@ENCIMAGES' @  Labs:  Lab Results  Component Value Date   HGBA1C 13.9 (H) 10/19/2018   HGBA1C 11.9 (H) 07/18/2018   HGBA1C 16.0 (H) 09/13/2014   ESRSEDRATE 52 (H) 10/19/2018   CRP 9.9 (H) 10/19/2018   LABURIC 5.3 07/11/2010   REPTSTATUS PENDING 10/19/2018   CULT  10/19/2018    NO GROWTH 3 DAYS Performed at [facility], [place] [address] [place], [place] [address]     Lab Results  Component Value Date   ALBUMIN 3.5 10/19/2018   ALBUMIN 4.3 07/18/2018   ALBUMIN 3.5 12/29/2016   PREALBUMIN 10.9 (L) 10/19/2018   PREALBUMIN 11.7 (L) 08/30/2012   LABURIC 5.3 07/11/2010    Neurologic: Patient does not have protective sensation bilateral lower extremities.   MUSCULOSKELETAL:   Skin: Examination patient has a large ulcer over the lateral aspect of the left foot.  He has palpable pulses bilaterally ankle-brachial indices show monophasic flow with falsely elevated numbers.  Patient has venous insufficiency in both lower extremities with swelling worse in the left leg than the right leg but no open ulcers at this time.  After informed consent the ulcer was debrided of skin and soft tissue back to healthy viable tissue.  The ulcer is superficial this does not probe to bone or tendon.  Ulcer is 4 cm in diameter 1 mm deep.  Review of the MRI scan shows no osteomyelitis or abscess.  Hemoglobin A1c 13.9 with a prealbumin of 10.9.  Blood cultures are negative.  Assessment: Assessment: Diabetic insensate neuropathy, with poorly controlled diabetes and protein caloric malnutrition with a recent abscess and ulceration lateral aspect of the left foot.  Plan: Plan: Orders are written for  Silvadene dressing changes patient needs to be strict nonweightbearing on the left foot I  will follow-up in the office in 1 week.  Patient will need short-term disability anticipate out of work for at least a month.  Recommend discharge on oral antibiotics.  Thank you for the consult and the opportunity to see Mr. Quintin Hjort, [place] [phone] 7:00 AM

## 2018-10-23 NOTE — Progress Notes (Signed)
Pharmacy Antibiotic Note  [name] is a 68 y.o. male admitted on 10/19/2018 with osteo from diabetic wound infection.   He was started on vanc/ceftriaxone/flagyl for the infection. Dr. [name] I&D back to healthy tissue today. Rec to dc on PO abx and see him in clinic. D/w with Dr. Broadus John and we will optimize his current abx to doxy and cefdinir. He will likely need 7-10d of therapy.   Plan:  Dc vanc/ceftriaxone/flagyl Cefdinir 600mg  PO qday Doxy 100mg  PO BID  Height: 5\' 6"  (167.6 cm) Weight: 162 lb (73.5 kg) IBW/kg (Calculated) : 63.8  No data recorded.  Recent Labs  Lab 10/19/18 1242 10/20/18 0623 10/21/18 0607  WBC 7.2 4.0 3.2*  CREATININE 1.90* 1.81* 1.62*  LATICACIDVEN 1.3  --   --     Estimated Creatinine Clearance: 38.8 mL/min (A) (by C-G formula based on SCr of 1.62 mg/dL (H)).    Allergies  Allergen Reactions  . Ace Inhibitors Itching and Cough  . Naproxen Anaphylaxis, Shortness Of Breath and Other (See Comments)    Throat swells. cannot breathe, and causes GI distress   Onnie Boer, PharmD, BCIDP, AAHIVP, CPP Infectious Disease Pharmacist 10/23/2018 9:46 AM

## 2018-10-23 NOTE — Plan of Care (Signed)

## 2018-10-23 NOTE — Progress Notes (Signed)
PROGRESS NOTE    [name]  YBO:[id number] DOB: 23-Jul-1951 DOA: 10/19/2018 PCP: [name], MD  Brief Narrative: 68 year old male with history of prostate cancer status post prostatectomy, type 2 diabetes mellitus, hypertension GERD presented to the emergency room with fever and chills.  He had been having swelling discomfort and some drainage from the lateral aspect of his left foot for 2 to 3 weeks.  In addition also was not able to afford his insulin and hence had not been taking it for 2 weeks and just recently got his supplies. -In the emergency room he was noted to have swelling in his left lateral foot with small wound and purulent drainage.  MRI was done which was notable for soft tissue swelling, no osteomyelitis he was started on broad-spectrum antibiotics -Followed by orthopedics subsequently noted to have sloughing of large part of his skin on the plantar aspect -Underwent bedside debridement this morning 3/10 by Dr. [name]  Assessment & Plan:   Diabetic foot infection -cellulitis with small wound on the lateral aspect of the foot with some purulent discharge -Appreciate ortho consult, seen by Dr.Blackman, now by Dr. Ninfa Linden -MRI without osteomyelitis -Skin over part of lateral plantar surface was starting to slough off -seen by Dr.Duda this am, he did a bedside debridement of skin and soft tissue back to healthy viable tissue this morning 3/10 -Will change to oral doxycycline and cefdinir today -Home tomorrow with Ortho follow-up in 1 week -Recommended strict nonweightbearing on left foot  Uncontrolled type 2 diabetes mellitus -Hemoglobin A1c is 13.9 unfortunately, was recently started on Toujeo and unable to afford this, missed all insulin for at least 2 weeks which likely caused a spike in A1c -Continue Levemir, diabetes coordinator consulted -CBGs stable now, charge home on insulin 70/30 likely 6 to 7 units twice daily  Presumed CAD -recent abnormal stress  test -Cardiology left a message for patient over the weekend for left heart cath to be scheduled as outpatient -Stable, continue aspirin and statin, heart rate in the 50s unable to add beta-blocker -Urgent follow-up with cardiology advised  CKD stage III -Baseline creatinine around 1.6 in 07/2018, then close to baseline at 1.8 now -Caution with vancomycin dosing  Essential hypertension -Losartan on hold -Stable, monitor  History of sarcoidosis -Follow-up with pulmonary   DVT prophylaxis: Lovenox Code Status: Full code Family Communication: wife at bedside Disposition Plan: Home pending above work-up  Consultants:   Orthopedics Dr. Ninfa Linden   Procedures: Bedside skin/soft tissue debridement 3/10 Dr. [name]  Antimicrobials:    Subjective: -Just had plantar surface skin debrided an hour ago by Dr. [name] -Reports having severe pain  Objective: Vitals:   10/21/18 1637 10/21/18 2030 10/22/18 0309 10/22/18 1508  BP: (!) 155/68 (!) 128/58 (!) 116/51 (!) 188/84  Pulse: 61 97 61 64  Resp:   16 17  Temp: 97.8 F (36.6 C) 98.4 F (36.9 C) 98.4 F (36.9 C)   TempSrc: Oral Oral Oral   SpO2: 100% 97% 98% 100%  Weight:      Height:        Intake/Output Summary (Last 24 hours) at 10/23/2018 1213 Last data filed at 10/23/2018 1100 Gross per 24 hour  Intake 840 ml  Output -  Net 840 ml   Filed Weights   10/19/18 1201  Weight: 73.5 kg    Examination:  Gen: Awake, Alert, Oriented X 3, distress HEENT: PERRLA, Neck supple, no JVD Lungs: Good air movement bilaterally, CTAB CVS: RRR,No Gallops,Rubs or  new Murmurs Abd: soft, Non tender, non distended, BS present Extremities: 1+ edema, dressing on the left foot Skin: As above Psychiatry: Judgement and insight appear normal. Mood & affect appropriate.     Data Reviewed:   CBC: Recent Labs  Lab 10/19/18 1242 10/20/18 0623 10/21/18 0607  WBC 7.2 4.0 3.2*  NEUTROABS 5.6  --   --   HGB 11.4* 9.8* 9.2*  HCT 37.6*  32.4* 30.5*  MCV 66.3* 65.7* 65.3*  PLT PLATELET CLUMPS NOTED ON SMEAR, COUNT APPEARS ADEQUATE PLATELET CLUMPS NOTED ON SMEAR, COUNT APPEARS ADEQUATE 509   Basic Metabolic Panel: Recent Labs  Lab 10/19/18 1242 10/20/18 0623 10/21/18 0607  NA 133* 137 136  K 4.2 4.3 4.1  CL 96* 101 105  CO2 28 24 24   GLUCOSE 296* 186* 173*  BUN 37* 32* 25*  CREATININE 1.90* 1.81* 1.62*  CALCIUM 9.7 8.7* 8.3*   GFR: Estimated Creatinine Clearance: 38.8 mL/min (A) (by C-G formula based on SCr of 1.62 mg/dL (H)). Liver Function Tests: Recent Labs  Lab 10/19/18 1242  AST 19  ALT 21  ALKPHOS 109  BILITOT 1.0  PROT 7.8  ALBUMIN 3.5   No results for input(s): LIPASE, AMYLASE in the last 168 hours. No results for input(s): AMMONIA in the last 168 hours. Coagulation Profile: No results for input(s): INR, PROTIME in the last 168 hours. Cardiac Enzymes: No results for input(s): CKTOTAL, CKMB, CKMBINDEX, TROPONINI in the last 168 hours. BNP (last 3 results) No results for input(s): PROBNP in the last 8760 hours. HbA1C: No results for input(s): HGBA1C in the last 72 hours. CBG: Recent Labs  Lab 10/22/18 1301 10/22/18 1639 10/22/18 2252 10/23/18 0730 10/23/18 1142  GLUCAP 152* 153* 137* 178* 216*   Lipid Profile: No results for input(s): CHOL, HDL, LDLCALC, TRIG, CHOLHDL, LDLDIRECT in the last 72 hours. Thyroid Function Tests: No results for input(s): TSH, T4TOTAL, FREET4, T3FREE, THYROIDAB in the last 72 hours. Anemia Panel: No results for input(s): VITAMINB12, FOLATE, FERRITIN, TIBC, IRON, RETICCTPCT in the last 72 hours. Urine analysis:    Component Value Date/Time   COLORURINE YELLOW 09/03/2015 1505   APPEARANCEUR CLEAR 09/03/2015 1505   LABSPEC 1.035 (H) 09/03/2015 1505   PHURINE 6.0 09/03/2015 1505   GLUCOSEU >1000 (A) 09/03/2015 1505   HGBUR NEGATIVE 09/03/2015 1505   HGBUR negative 08/19/2008 1530   BILIRUBINUR NEGATIVE 09/03/2015 1505   KETONESUR NEGATIVE 09/03/2015 1505    PROTEINUR NEGATIVE 09/03/2015 1505   UROBILINOGEN 0.2 04/08/2015 0456   NITRITE NEGATIVE 09/03/2015 1505   LEUKOCYTESUR NEGATIVE 09/03/2015 1505   Sepsis Labs: @LABRCNTIP (procalcitonin:4,lacticidven:4)  ) Recent Results (from the past 240 hour(s))  Culture, blood (routine x 2)     Status: None (Preliminary result)   Collection Time: 10/19/18 12:18 PM  Result Value Ref Range Status   Specimen Description BLOOD SITE NOT SPECIFIED  Final   Special Requests   Final    BOTTLES DRAWN AEROBIC AND ANAEROBIC Blood Culture adequate volume   Culture   Final    NO GROWTH 4 DAYS Performed at [facility], [place] [address] [place], [place] [address]    Report Status PENDING  Incomplete  Culture, blood (routine x 2)     Status: None (Preliminary result)   Collection Time: 10/19/18  2:32 PM  Result Value Ref Range Status   Specimen Description BLOOD LEFT FOREARM  Final   Special Requests   Final    AEROBIC BOTTLE ONLY Blood Culture results may not be  optimal due to an inadequate volume of blood received in culture bottles   Culture   Final    NO GROWTH 4 DAYS Performed at [facility], [place] [address], [place], [place] [address]    Report Status PENDING  Incomplete         Radiology Studies: Vas [place] Abi With/wo Tbi  Result Date: 10/22/2018 LOWER EXTREMITY DOPPLER STUDY Indications: Left foot ulcer. High Risk Factors: Diabetes.  Performing Technologist: June Leap RDMS, RVT  Examination Guidelines: A complete evaluation includes at minimum, Doppler waveform signals and systolic blood pressure reading at the level of bilateral brachial, anterior tibial, and posterior tibial arteries, when vessel segments are accessible. Bilateral testing is considered an integral part of a complete examination. Photoelectric Plethysmograph (PPG) waveforms and toe systolic pressure readings are included as required and additional duplex testing as needed. Limited examinations for reoccurring  indications may be performed as noted.  ABI Findings: +---------+------------------+-----+----------+--------+ Right    Rt Pressure (mmHg)IndexWaveform  Comment  +---------+------------------+-----+----------+--------+ Brachial 182                    triphasic          +---------+------------------+-----+----------+--------+ ATA      179               0.98 monophasic         +---------+------------------+-----+----------+--------+ PTA      208               1.14 biphasic           +---------+------------------+-----+----------+--------+ Great Toe108               0.59 Abnormal           +---------+------------------+-----+----------+--------+ +---------+------------------+-----+----------+-------+ Left     Lt Pressure (mmHg)IndexWaveform  Comment +---------+------------------+-----+----------+-------+ Brachial 178                    triphasic         +---------+------------------+-----+----------+-------+ ATA      139               0.76 monophasic        +---------+------------------+-----+----------+-------+ PTA      187               1.03 monophasic        +---------+------------------+-----+----------+-------+ Great Toe42                0.23 Abnormal          +---------+------------------+-----+----------+-------+  Summary: Right: ABI is within normal limits, however is most likely falsely elevated based on abnormal waveforms. TBI is abnormal. Left: ABI is within normal limits, however is most likely falsely elevated based on abnormal waveforms. TBI is abnormal, and pressure is significantly lower than right side.  *See table(s) above for measurements and observations.  Electronically signed by Ruta Hinds MD on 10/22/2018 at 5:02:13 PM.   Final         Scheduled Meds: . aspirin  81 mg Oral Daily  . atorvastatin  20 mg Oral q1800  . cefdinir  600 mg Oral Daily  . doxycycline  100 mg Oral Q12H  . enoxaparin (LOVENOX) injection  40 mg  Subcutaneous Q24H  . feeding supplement (GLUCERNA SHAKE)  237 mL Oral BID BM  . gabapentin  300 mg Oral TID  . insulin aspart  0-15 Units Subcutaneous TID WC  . insulin aspart  0-5 Units Subcutaneous QHS  . insulin  detemir  10 Units Subcutaneous QHS  . pantoprazole  40 mg Oral Daily  . polyethylene glycol  17 g Oral BID  . senna-docusate  1 tablet Oral BID  . silver sulfADIAZINE   Topical Daily  . sodium chloride flush  3 mL Intravenous Q12H   Continuous Infusions:    LOS: 4 days    Time spent: 65min    Domenic Polite, MD [facility]  10/23/2018, 12:13 PM

## 2018-10-23 NOTE — Progress Notes (Addendum)
Inpatient Diabetes Program Recommendations  AACE/ADA: New Consensus Statement on Inpatient Glycemic Control (2015)  Target Ranges:  Prepandial:   less than 140 mg/dL      Peak postprandial:   less than 180 mg/dL (1-2 hours)      Critically ill patients:  140 - 180 mg/dL   Lab Results  Component Value Date   GLUCAP 178 (H) 10/23/2018   HGBA1C 13.9 (H) 10/19/2018    Review of Glycemic Control Results for [name], [name] (MRN [id number]) as of 10/23/2018 10:13  Ref. Range 10/22/2018 13:01 10/22/2018 16:39 10/22/2018 22:52 10/23/2018 07:30  Glucose-Capillary Latest Ref Range: 70 - 99 mg/dL 152 (H) 153 (H) 137 (H) 178 (H)   Diabetes history: Type 2 DM Outpatient Diabetes medications: Trulicity 9.44 mg Q week, Toujeo 50 units QD Current orders for Inpatient glycemic control: Levemir 10 units QHS, Novolog 0-15 units TID, Novolog 0-5 units QHS  Inpatient Diabetes Program Recommendations:   Patient will need a meter at discharge. Blood glucose kit (inlcudes lancets and strips) ([id number]). Would not recommend discharging patient home on Toujeo 50 units QD due to high probability of hypoglycemia given current inpatient needs.  If to remain inpatient, consider increasing Levemir 12 units QHS.  Will continue to follow.   Addendum@ 1130: Spoke with patient again to review DM management. Wanted to clarify discharge plan for insulin given yesterday's conversation. Patient is not consistent with Toujeo administration and had complaints of multiple lows. Feel that Novolin 70/30 is a better option for this patient to increase consistency and minimize lows.  Patient educated on Novolin 70/30, action, differences between intermediate acting insulin and short acting insulin, cost, when to take, when to call PCP, sick day rules and stressed the importance of eating and checking blood glucose with administration. Would recommend Novolin 70/30 8 units BID.  Reviewed the importance of following up with PCP and  that he can discuss Toujeo at that time. Reminded that dosages would be much different. Patient did not want me to set up alarm on his phone, refused despite encouragement and reminding on importance of not missing doses.  Patient plans to follow up.  Discussed update with Dr Broadus John.  Thanks, Bronson Curb, MSN, RNC-OB Diabetes Coordinator [phone] (8a-5p)

## 2018-10-23 NOTE — Evaluation (Signed)
Physical Therapy Evaluation Patient Details Name: [name] MRN: [id number] DOB: 1950/11/25 Today's Date: 10/23/2018   History of Present Illness  68 y.o. male with medical history significant of prostate cancer s/p prostatectomy, GERD, DM2, HTN who presents for chills and myalgias.  He reports that he has been having issues with his feet for about 3 weeks.  xray of his foot which showed charcot type changes. MRI did not reveal evidence of osteomyelitis. s/p I&D of superficial ulceration of plantar lateral aspect of L foot 10/22/2018. Plan for silvadene dressing and strict nonweightbearing to maximize healing.    Clinical Impression  Patient is s/p above surgery resulting in functional limitations due to the deficits listed below (see PT Problem List). Pt requires maximal education on every step of mobility in order to maintain NWB through L LE. PTA pt lived with wife in 53nd story apartment with 12 steps to enter, working independently. Pt is currently mod I for bed mobility, supervision for transfers, min guard for ambulation and minA for ascent/descent of 3 4" steps backwards/forwards using RW. Pt given extensive education on need to maintain NWB for healing and his inability at this time to climb 12 6" steps into his home. Offered PTAR transport and training on bumping up on his bottom. Pt refused both techniques. PT agreed to try again in morning before discharge however it is unlikely pt will be able to maintain NWB and climb stairs with use of RW and wife's help. Once home patient will benefit from skilled PT to increase their independence and safety with mobility to allow discharge to the venue listed below.       Follow Up Recommendations Home health PT;Supervision for mobility/OOB    Equipment Recommendations  Rolling walker with 5" wheels;3in1 (PT)    Recommendations for Other Services OT consult     Precautions / Restrictions Precautions Precautions: Fall Precaution Comments:  NWB L LE Required Braces or Orthoses: Other Brace Other Brace: Post op shoe Restrictions Weight Bearing Restrictions: Yes LLE Weight Bearing: Non weight bearing      Mobility  Bed Mobility Overal bed mobility: Modified Independent             General bed mobility comments: HoB elevated and use of bed rail to pull to EoB  Transfers Overall transfer level: Needs assistance Equipment used: Rolling walker (2 wheeled) Transfers: Sit to/from Stand Sit to Stand: Supervision         General transfer comment: supervision for safety, vc for scooting hips to EoB and for hand placement for power up  Ambulation/Gait Ambulation/Gait assistance: Min guard Gait Distance (Feet): 20 Feet Assistive device: Rolling walker (2 wheeled) Gait Pattern/deviations: Antalgic;Trunk flexed(hop to pattern) Gait velocity: slowed Gait velocity interpretation: <1.8 ft/sec, indicate of risk for recurrent falls General Gait Details: hands on min guard for safety, maximal vc to maintain NWB through L LE, by increasing use of UE   Stairs Stairs: Yes Stairs assistance: Min assist Stair Management: One rail Left;Step to pattern;Sideways;No rails;Backwards;Forwards;With walker Number of Stairs: 3 General stair comments: initially attempted to hop up 4" steps sideways with use of L sided rail, unable to produce enough lift to get R LE up on tread, then attempted ascent backward with use of RW, requiring minA for steadying, maximal cuing for maintaining NWB. PT discussed pt difficulty with clearing 4" steps so discussed possible PTAR transport home and pt feels that would be embarrasing, discussed bumping up on his bottom and pt stated it was undignified,  Pt wanting to keep PT and work on going up 6 inch steps today. Agreed to try tomorrow.       Balance Overall balance assessment: Needs assistance Sitting-balance support: No upper extremity supported;Feet supported Sitting balance-Leahy Scale: Good      Standing balance support: Bilateral upper extremity supported Standing balance-Leahy Scale: Poor Standing balance comment: requires B UE support to maintain weightbearing                             Pertinent Vitals/Pain Pain Assessment: Faces Faces Pain Scale: Hurts little more Pain Location: L LE in dependent position  Pain Descriptors / Indicators: Throbbing;Tender;Grimacing Pain Intervention(s): Limited activity within patient's tolerance;Monitored during session;Repositioned    Home Living Family/patient expects to be discharged to:: Private residence Living Arrangements: Spouse/significant other Available Help at Discharge: Family;Available 24 hours/day Type of Home: Apartment Home Access: Stairs to enter Entrance Stairs-Rails: Left Entrance Stairs-Number of Steps: 12 Home Layout: One level Home Equipment: None      Prior Function Level of Independence: Independent         Comments: working        Extremity/Trunk Assessment   Upper Extremity Assessment Upper Extremity Assessment: Overall WFL for tasks assessed    Lower Extremity Assessment Lower Extremity Assessment: LLE deficits/detail;RLE deficits/detail RLE Deficits / Details: decreased sensation, ROM and strength WFL RLE Sensation: history of peripheral neuropathy LLE Deficits / Details: L foot ulcer debrided LLE: Unable to fully assess due to pain LLE Sensation: history of peripheral neuropathy       Communication   Communication: No difficulties  Cognition Arousal/Alertness: Awake/alert Behavior During Therapy: WFL for tasks assessed/performed Overall Cognitive Status: Within Functional Limits for tasks assessed                                        General Comments General comments (skin integrity, edema, etc.): Wound covered with 4x4 and taped, with minimal serous drainage noted, placed sock over dressing and placed post op shoe for protection with mobility.          Assessment/Plan    PT Assessment Patient needs continued PT services  PT Problem List Decreased strength;Decreased activity tolerance;Decreased balance;Decreased mobility;Decreased coordination;Decreased safety awareness;Decreased knowledge of precautions;Impaired sensation;Pain       PT Treatment Interventions DME instruction;Gait training;Stair training;Functional mobility training;Therapeutic activities;Therapeutic exercise;Balance training;Cognitive remediation;Patient/family education    PT Goals (Current goals can be found in the Care Plan section)  Acute Rehab PT Goals Patient Stated Goal: go to [place] in November PT Goal Formulation: With patient Time For Goal Achievement: 11/06/18 Potential to Achieve Goals: Fair    Frequency Min 5X/week   Barriers to discharge Inaccessible home environment 12 steps to enter home       AM-PAC PT "6 Clicks" Mobility  Outcome Measure Help needed turning from your back to your side while in a flat bed without using bedrails?: None Help needed moving from lying on your back to sitting on the side of a flat bed without using bedrails?: None Help needed moving to and from a bed to a chair (including a wheelchair)?: A Little Help needed standing up from a chair using your arms (e.g., wheelchair or bedside chair)?: A Little Help needed to walk in hospital room?: A Little Help needed climbing 3-5 steps with a railing? : A Little 6 Click Score:  20    End of Session Equipment Utilized During Treatment: Gait belt Activity Tolerance: Patient tolerated treatment well Patient left: in chair;with call bell/phone within reach;with chair alarm set Nurse Communication: Mobility status;Weight bearing status;Precautions PT Visit Diagnosis: Unsteadiness on feet (R26.81);Other abnormalities of gait and mobility (R26.89);Muscle weakness (generalized) (M62.81);Difficulty in walking, not elsewhere classified (R26.2);Other symptoms and signs involving  the nervous system (R29.898);Pain Pain - Right/Left: Left Pain - part of body: Ankle and joints of foot    Time: 1010-1125 PT Time Calculation (min) (ACUTE ONLY): 75 min   Charges:   PT Evaluation $PT Eval Low Complexity: 1 Low PT Treatments $Gait Training: 53-67 mins        Gavin Faivre B. Migdalia Dk PT, DPT Acute Rehabilitation Services Pager [phone] Office [phone]   [place] 10/23/2018, 11:58 AM

## 2018-10-24 LAB — CULTURE, BLOOD (ROUTINE X 2)
Culture: NO GROWTH
Culture: NO GROWTH
Special Requests: ADEQUATE

## 2018-10-24 LAB — GLUCOSE, CAPILLARY
Glucose-Capillary: 123 mg/dL — ABNORMAL HIGH (ref 70–99)
Glucose-Capillary: 106 mg/dL — ABNORMAL HIGH (ref 70–99)

## 2018-10-24 MED ORDER — CEFDINIR 300 MG PO CAPS: 600.0000 mg | ORAL_CAPSULE | Freq: Every day | ORAL | 0 refills | Status: AC

## 2018-10-24 MED ORDER — BLOOD GLUCOSE MONITOR KIT: PACK | 1 refills | Status: DC

## 2018-10-24 MED ORDER — DOXYCYCLINE HYCLATE 100 MG PO TABS: 100.0000 mg | ORAL_TABLET | Freq: Two times a day (BID) | ORAL | 0 refills | Status: AC

## 2018-10-24 MED ORDER — POLYETHYLENE GLYCOL 3350 17 G PO PACK: 17.0000 g | PACK | Freq: Every day | ORAL | 0 refills | Status: DC | PRN

## 2018-10-24 MED ORDER — OXYCODONE HCL 5 MG PO TABS: 5.0000 mg | ORAL_TABLET | Freq: Four times a day (QID) | ORAL | 0 refills | Status: DC | PRN

## 2018-10-24 MED ORDER — INSULIN ISOPHANE & REGULAR (HUMAN 70-30)100 UNIT/ML KWIKPEN: 8.0000 [IU] | PEN_INJECTOR | Freq: Two times a day (BID) | SUBCUTANEOUS | 0 refills | Status: DC

## 2018-10-24 NOTE — Plan of Care (Signed)
  Problem: Education: Goal: Knowledge of General Education information will improve Description Including pain rating scale, medication(s)/side effects and non-pharmacologic comfort measures Outcome: Adequate for Discharge   Problem: Clinical Measurements: Goal: Ability to maintain clinical measurements within normal limits will improve Outcome: Adequate for Discharge   Problem: Clinical Measurements: Goal: Will remain free from infection Outcome: Adequate for Discharge   Problem: Clinical Measurements: Goal: Cardiovascular complication will be avoided Outcome: Adequate for Discharge   Problem: Pain Managment: Goal: General experience of comfort will improve Outcome: Adequate for Discharge

## 2018-10-24 NOTE — TOC Initial Note (Signed)
Transition of Care [facility]) - Initial/Assessment Note    Patient Details  Name: [name] MRN: [id number] Date of Birth: 02/22/1951  Transition of Care [facility]) CM/SW Contact:    Ninfa Meeker, RN Phone Number: 10/24/2018, 1:46 PM  Clinical Narrative:                   Expected Discharge Plan: [facility]     Patient Goals and CMS Choice Patient states their goals for this hospitalization and ongoing recovery are:: to heal CMS Medicare.gov Compare Post Acute Care list provided to:: Patient Choice offered to / list presented to : Patient  Expected Discharge Plan and Services Expected Discharge Plan: [place] Discharge Planning Services: CM Consult Post Acute Care Choice: Durable Medical Equipment, Home Health   Expected Discharge Date: 10/24/18               DME Arranged: [name] rolling DME Agency: [employer] HH Arranged: PT HH Agency: Home  Prior Living Arrangements/Services    Home with wife                   Activities of Daily Living Home Assistive Devices/Equipment: None ADL Screening (condition at time of admission) Patient's cognitive ability adequate to safely complete daily activities?: Yes Is the patient deaf or have difficulty hearing?: No Does the patient have difficulty seeing, even when wearing glasses/contacts?: Yes Does the patient have difficulty concentrating, remembering, or making decisions?: No Patient able to express need for assistance with ADLs?: No Does the patient have difficulty dressing or bathing?: No Independently performs ADLs?: Yes (appropriate for developmental age) Does the patient have difficulty walking or climbing stairs?: Yes Weakness of Legs: Left Weakness of Arms/Hands: None  Permission Sought/Granted                  Emotional Assessment              Admission diagnosis:  Hyperglycemia [R73.9] Diabetic foot infection ([place]) [K44.010,  L08.9] Patient Active Problem List   Diagnosis Date Noted  . Moderate protein-calorie malnutrition ([place])   . Diabetic foot ulcer ([place]) 10/19/2018  . Diabetic foot infection ([place])   . Adrenal insufficiency ([place]) 04/09/2015  . Peripheral edema 09/17/2014  . DOE (dyspnea on exertion) 09/17/2014  . Bronchospasm, acute 09/16/2014  . Partial small bowel obstruction ([place]) 09/16/2014  . Hyperglycemia   . Diabetic retinopathy associated with type 2 diabetes mellitus ([place]) 09/13/2014  . Abdominal pain 09/13/2014  . Cough 11/15/2012  . Pharyngitis 09/14/2012  . Fluid overload 09/03/2012  . Ileus following gastrointestinal surgery ([place]) 09/03/2012  . Hypokalemia 08/30/2012  . Small bowel obstruction s/p LOA [id number] 08/21/2012  . Abnormal EKG 08/21/2012  . Chest pain 08/21/2012  . Macular degeneration (senile) of retina 12/15/2011  . Lens replaced 12/15/2011  . Vitreous hemorrhage ([place]) 12/15/2011  . Essential hypertension 08/19/2008  . Regional enteritis of small intestine ([place]) 01/03/2008  . ERECTILE DYSFUNCTION, ORGANIC 01/03/2008  . Cellulitis and abscess of face 10/11/2007  . Sarcoidosis 07/19/2007  . Diabetes mellitus ([place]) 07/19/2007  . HYPERLIPIDEMIA, MIXED 07/19/2007  . CERUMEN IMPACTION, BILATERAL 07/19/2007  . URINARY INCONTINENCE, STRESS, MALE 08/17/2006  . PROSTATE CANCER, HX OF 08/17/2006  . DIABETIC  RETINOPATHY 01/09/2002   PCP:  [name], MD Pharmacy:   [facility] [address] [place]  [place] [address] Phone: [phone] Fax: [phone]     Social Determinants of Health (SDOH) Interventions    Readmission Risk Interventions 30 Day Unplanned Readmission Risk Score     ED to Hosp-Admission (Current) from 10/19/2018 in [place]  30 Day Unplanned Readmission Risk Score (%)  13 Filed at 10/24/2018 1200     This score is the  patient's risk of an unplanned readmission within 30 days of being discharged (0 -100%). The score is based on dignosis, age, lab data, medications, orders, and past utilization.   Low:  0-14.9   Medium: 15-21.9   High: 22-29.9   Extreme: 30 and above       No flowsheet data found.

## 2018-10-24 NOTE — Discharge Summary (Signed)
Physician Discharge Summary  [name] HKV:[id number] DOB: 30-Mar-1951 DOA: 10/19/2018  PCP: [name], MD  Admit date: 10/19/2018 Discharge date: 10/24/2018  Time spent: 45 minutes  Recommendations for Outpatient Follow-up:  1. Orthopedics Dr. [name] in 1 week, advised not to bear weight on left foot until Ortho follow-up 2. Home health physical therapy 3. PCP in 1 week titrate insulin dose as needed, insulin changed due to cost/affordability 4. [facility] on 3/25 for outpatient heart cath   Discharge Diagnoses:  Diabetic foot infection Uncontrolled type 2 diabetes mellitus with hyperglycemia Chronic kidney disease stage II Abnormal stress test, left heart cath pending  essential hypertension   Hyperglycemia   Moderate protein-calorie malnutrition ([place])   Discharge Condition: Stable  Diet recommendation: Low-sodium heart healthy and carb modified  Filed Weights   10/19/18 1201  Weight: 73.5 kg    History of present illness:  68 year old male with history of prostate cancer status post prostatectomy, type 2 diabetes mellitus, hypertension GERD presented to the emergency room with fever and chills.  He had been having swelling discomfort and some drainage from the lateral aspect of his left foot for 2 to 3 weeks.  In addition also was not able to afford his insulin and hence had not been taking it for 2 weeks and just recently got his supplies. -In the emergency room he was noted to have swelling in his left lateral foot with small wound and purulent drainage  Hospital Course:   Diabetic foot infection -Presented with cellulitis with small wound on the lateral aspect of the foot with some purulent discharge -Orthopedics was consulted originally seen by Dr. Ninfa Linden over the weekend and subsequently Dr. [name] -MRI did not show evidence of osteomyelitis or abscess, subsequently skin over the lateral plantar aspect of his foot started to slough off -Yesterday he  underwent a bedside debridement of skin and soft tissue back to healthy viable tissue 3/10 a.m. -Was originally treated with vancomycin subsequently transitioned to oral doxycycline and cefdinir at discharge -Advised not to bear weight on his left foot until orthopedic follow-up in 1 week with Dr. [name]  Uncontrolled type 2 diabetes mellitus -Hemoglobin A1c is 13.9 unfortunately, was recently started on Toujeo and unable to afford this, missed all insulin for at least 2 weeks which likely caused a spike in A1c -Due to cost and affordability concerns leading to noncompliance insulin changed to 7030, discharged on 8 units twice daily, also given prescription for glucometer and kit -PCP follow-up in 1 week advised, will need insulin dose titrated at follow-up  Presumed CAD -recent abnormal outpatient stress test -Cardiology left a message for patient for left heart cath to be scheduled as outpatient for 3/25 -Stable, continue aspirin and statin, heart rate in the 50s unable to add beta-blocker  CKD stage III -Baseline creatinine around 1.6 in 07/2018, then close to baseline at 1.8 now -Caution with vancomycin dosing  Essential hypertension -Losartan held, can be resumed at low-dose and follow-up  History of sarcoidosis -Follow-up with pulmonary   Consultants:   Orthopedics Dr. Ninfa Linden Dr. [name]   Procedures: Bedside skin/soft tissue debridement 3/10 Dr. [name]   Discharge Exam: Vitals:   10/23/18 1314 10/23/18 2100  BP: (!) 179/69 (!) 177/67  Pulse: 61 66  Resp:  20  Temp: 98.1 F (36.7 C) 98 F (36.7 C)  SpO2: 100% 100%    General: Alert awake oriented x3 Cardiovascular: S1-S2/regular rate rhythm Respiratory: Clear bilaterally  Discharge Instructions  Discharge Instructions    Diet - low sodium heart healthy   Complete by:  As directed    Diet Carb Modified   Complete by:  As directed    Increase activity slowly   Complete by:  As directed    Do not bear  weight on left foot until seen by Ortho in FOllow up     Allergies as of 10/24/2018      Reactions   Ace Inhibitors Itching, Cough   Naproxen Anaphylaxis, Shortness Of Breath, Other (See Comments)   Throat swells. cannot breathe, and causes GI distress      Medication List    STOP taking these medications   chlorthalidone 25 MG tablet Commonly known as:  HYGROTON   ciprofloxacin 0.3 % ophthalmic solution Commonly known as:  CILOXAN   Insulin Glargine (2 Unit Dial) 300 UNIT/ML Sopn Commonly known as:  Toujeo Max SoloStar   losartan 50 MG tablet Commonly known as:  COZAAR   Pen Needles 32G X 6 MM Misc     TAKE these medications   acetaminophen 650 MG CR tablet Commonly known as:  TYLENOL Take 650 mg by mouth every 8 (eight) hours as needed for pain.   aspirin 81 MG chewable tablet Chew 81 mg by mouth daily.   atorvastatin 20 MG tablet Commonly known as:  LIPITOR Take 20 mg by mouth daily.   blood glucose meter kit and supplies Kit Dispense based on patient and insurance preference. Use up to four times daily as directed. (FOR ICD-9 250.00, 250.01). What changed:  additional instructions   cefdinir 300 MG capsule Commonly known as:  OMNICEF Take 2 capsules (600 mg total) by mouth daily for 5 days. Start taking on:  October 25, 2018   doxycycline 100 MG tablet Commonly known as:  VIBRA-TABS Take 1 tablet (100 mg total) by mouth every 12 (twelve) hours for 5 days.   freestyle lancets Use as instructed   Lancets 30G Misc   furosemide 40 MG tablet Commonly known as:  LASIX Take 40 mg by mouth daily.   gabapentin 300 MG capsule Commonly known as:  NEURONTIN Take 1 capsule (300 mg total) by mouth 3 (three) times daily.   Insulin Isophane & Regular Human (70-30) 100 UNIT/ML PEN Commonly known as:  HUMULIN 70/30 MIX Inject 8 Units into the skin 2 (two) times daily before a meal.   multivitamin with minerals Tabs tablet Take 1 tablet by mouth daily.    oxyCODONE 5 MG immediate release tablet Commonly known as:  Oxy IR/ROXICODONE Take 1 tablet (5 mg total) by mouth every 6 (six) hours as needed for severe pain.   polyethylene glycol packet Commonly known as:  MIRALAX / GLYCOLAX Take 17 g by mouth daily as needed for mild constipation.      Allergies  Allergen Reactions  . Ace Inhibitors Itching and Cough  . Naproxen Anaphylaxis, Shortness Of Breath and Other (See Comments)    Throat swells. cannot breathe, and causes GI distress   Follow-up Information    Newt Minion, MD Follow up in 1 week(s).   Specialty:  Orthopedic Surgery Contact information: [place] [address] [phone]        [name], MD. Schedule an appointment as soon as possible for a visit in 1 week(s).   Specialty:  Family Medicine Contact information: [address] [place] [address] [phone]            The results  of significant diagnostics from this hospitalization (including imaging, microbiology, ancillary and laboratory) are listed below for reference.    Significant Diagnostic Studies: Dg Chest 2 View  Result Date: 10/19/2018 CLINICAL DATA:  Chills, fever EXAM: CHEST - 2 VIEW COMPARISON:  02/09/2018 chest radiograph. FINDINGS: Stable cardiomediastinal silhouette with normal heart size. No pneumothorax. No pleural effusion. Lungs appear clear, with no acute consolidative airspace disease and no pulmonary edema. IMPRESSION: No active cardiopulmonary disease. Electronically Signed   By: Ilona Sorrel M.D.   On: 10/19/2018 13:39   Mri Left Foot Without Contrast  Result Date: 10/20/2018 CLINICAL DATA:  Lateral foot wound.  Fever and chills. EXAM: MRI OF THE LEFT FOOT WITHOUT CONTRAST TECHNIQUE: Multiplanar, multisequence MR imaging of the left foot was performed. No intravenous contrast was administered. COMPARISON:  Left foot x-rays from yesterday. FINDINGS: Bones/Joint/Cartilage Prominent marrow edema  and dull vein the midfoot and proximal second through fifth metatarsals with predominantly subchondral osseous destruction and fragmentation. There is collapse of the midfoot. No acute fracture. Mild first MTP joint osteoarthritis. Ligaments Collateral ligaments are intact. Lisfranc ligament appears torn. Anterior and posterior tibiofibular, anterior posterior talofibular, calcaneofibular, and deltoid ligaments are grossly intact. Muscles and Tendons Flexor, peroneal and extensor compartment tendons are intact. Increased T2 signal within the intrinsic muscles of the forefoot, nonspecific, but likely related to diabetic muscle changes. Soft tissue Moderate diffuse soft tissue swelling. Small superficial skin blister along the lateral midfoot at the base of the fifth metatarsal. No underlying fluid collection or sinus tract. The underlying subcutaneous fat is relatively preserved. IMPRESSION: 1. Findings most favored to reflect active neuropathic arthropathy of the midfoot. 2. Superimposed osteomyelitis is unlikely given lack of discrete ulcer or sinus tract. There is a small superficial skin blister along the lateral midfoot at the base of the fifth metatarsal with preserved underlying subcutaneous fat. Electronically Signed   By: Titus Dubin M.D.   On: 10/20/2018 09:59   Dg Foot Complete Left  Result Date: 10/19/2018 CLINICAL DATA:  Open sore lateral left foot.  Chills.  Fever. EXAM: LEFT FOOT - COMPLETE 3+ VIEW COMPARISON:  None. FINDINGS: Diffuse left foot soft tissue swelling, most prominent in the mid foot. No fracture or dislocation. Extensive hypertrophic degenerative and erosive change at the tarsometatarsal joints, most prominent at the second through fourth tarsometatarsal joints. No suspicious focal osseous lesions. No radiopaque foreign body. Vascular calcifications throughout the soft tissues. Small plantar left calcaneal spur. IMPRESSION: Diffuse left foot soft tissue swelling, most prominent in  the mid foot. Extensive hypertrophic degenerative and erosive changes at the tarsometatarsal joints, most prominent at the second through fourth tarsometatarsal joints. Differential includes Charcot joint changes of diabetes mellitus versus osteomyelitis. Further evaluation with MRI of the left foot may be obtained as clinically warranted. Electronically Signed   By: Ilona Sorrel M.D.   On: 10/19/2018 13:42   Vas [place] Burnard Bunting With/wo Tbi  Result Date: 10/22/2018 LOWER EXTREMITY DOPPLER STUDY Indications: Left foot ulcer. High Risk Factors: Diabetes.  Performing Technologist: June Leap RDMS, RVT  Examination Guidelines: A complete evaluation includes at minimum, Doppler waveform signals and systolic blood pressure reading at the level of bilateral brachial, anterior tibial, and posterior tibial arteries, when vessel segments are accessible. Bilateral testing is considered an integral part of a complete examination. Photoelectric Plethysmograph (PPG) waveforms and toe systolic pressure readings are included as required and additional duplex testing as needed. Limited examinations for reoccurring indications may be performed as noted.  ABI Findings: +---------+------------------+-----+----------+--------+ Right  Rt Pressure (mmHg)IndexWaveform  Comment  +---------+------------------+-----+----------+--------+ Brachial 182                    triphasic          +---------+------------------+-----+----------+--------+ ATA      179               0.98 monophasic         +---------+------------------+-----+----------+--------+ PTA      208               1.14 biphasic           +---------+------------------+-----+----------+--------+ Great Toe108               0.59 Abnormal           +---------+------------------+-----+----------+--------+ +---------+------------------+-----+----------+-------+ Left     Lt Pressure (mmHg)IndexWaveform  Comment  +---------+------------------+-----+----------+-------+ Brachial 178                    triphasic         +---------+------------------+-----+----------+-------+ ATA      139               0.76 monophasic        +---------+------------------+-----+----------+-------+ PTA      187               1.03 monophasic        +---------+------------------+-----+----------+-------+ Great Toe42                0.23 Abnormal          +---------+------------------+-----+----------+-------+  Summary: Right: ABI is within normal limits, however is most likely falsely elevated based on abnormal waveforms. TBI is abnormal. Left: ABI is within normal limits, however is most likely falsely elevated based on abnormal waveforms. TBI is abnormal, and pressure is significantly lower than right side.  *See table(s) above for measurements and observations.  Electronically signed by Ruta Hinds MD on 10/22/2018 at 5:02:13 PM.   Final    Vas [place] Lower Extremity Venous (dvt) (only [place])  Result Date: 10/19/2018  Lower Venous Study Indications: Swelling, Erythema, and fever, ulcer on lateral left foot.  Limitations: Poor ultrasound/tissue interface. Performing Technologist: Maudry Mayhew MHA, RDMS, RVT, RDCS  Examination Guidelines: A complete evaluation includes B-mode imaging, spectral Doppler, color Doppler, and power Doppler as needed of all accessible portions of each vessel. Bilateral testing is considered an integral part of a complete examination. Limited examinations for reoccurring indications may be performed as noted.  Right Venous Findings: +---+---------------+---------+-----------+----------+--------------+    CompressibilityPhasicitySpontaneityPropertiesSummary        +---+---------------+---------+-----------+----------+--------------+ CFV                                             Not visualized +---+---------------+---------+-----------+----------+--------------+  Left Venous  Findings: +---------+---------------+---------+-----------+----------+--------------+          CompressibilityPhasicitySpontaneityPropertiesSummary        +---------+---------------+---------+-----------+----------+--------------+ CFV      Full           Yes      Yes                                 +---------+---------------+---------+-----------+----------+--------------+ SFJ      Full                                                        +---------+---------------+---------+-----------+----------+--------------+  FV Prox  Full                                                        +---------+---------------+---------+-----------+----------+--------------+ FV Mid   Full                                                        +---------+---------------+---------+-----------+----------+--------------+ FV DistalFull                                                        +---------+---------------+---------+-----------+----------+--------------+ PFV      Full                                                        +---------+---------------+---------+-----------+----------+--------------+ POP      Full           Yes      Yes                                 +---------+---------------+---------+-----------+----------+--------------+ PTV      Full                                                        +---------+---------------+---------+-----------+----------+--------------+ PERO                                                  Not visualized +---------+---------------+---------+-----------+----------+--------------+    Summary: Left: There is no evidence of deep vein thrombosis in the lower extremity. However, portions of this examination were limited- see technologist comments above. No cystic structure found in the popliteal fossa.  *See table(s) above for measurements and observations. Electronically signed by Monica Martinez MD on 10/19/2018 at  5:57:24 PM.    Final     Microbiology: Recent Results (from the past 240 hour(s))  Culture, blood (routine x 2)     Status: None   Collection Time: 10/19/18 12:18 PM  Result Value Ref Range Status   Specimen Description BLOOD SITE NOT SPECIFIED  Final   Special Requests   Final    BOTTLES DRAWN AEROBIC AND ANAEROBIC Blood Culture adequate volume   Culture   Final    NO GROWTH 5 DAYS Performed at [facility], [address], [place], [place] [address]    Report Status 10/24/2018 FINAL  Final  Culture, blood (routine x 2)     Status: None   Collection Time: 10/19/18  2:32 PM  Result Value Ref  Range Status   Specimen Description BLOOD LEFT FOREARM  Final   Special Requests   Final    AEROBIC BOTTLE ONLY Blood Culture results may not be optimal due to an inadequate volume of blood received in culture bottles   Culture   Final    NO GROWTH 5 DAYS Performed at [facility], [place] [address] [place], [place] [address]    Report Status 10/24/2018 FINAL  Final     Labs: Basic Metabolic Panel: Recent Labs  Lab 10/19/18 1242 10/20/18 0623 10/21/18 0607  NA 133* 137 136  K 4.2 4.3 4.1  CL 96* 101 105  CO2 '28 24 24  ' GLUCOSE 296* 186* 173*  BUN 37* 32* 25*  CREATININE 1.90* 1.81* 1.62*  CALCIUM 9.7 8.7* 8.3*   Liver Function Tests: Recent Labs  Lab 10/19/18 1242  AST 19  ALT 21  ALKPHOS 109  BILITOT 1.0  PROT 7.8  ALBUMIN 3.5   No results for input(s): LIPASE, AMYLASE in the last 168 hours. No results for input(s): AMMONIA in the last 168 hours. CBC: Recent Labs  Lab 10/19/18 1242 10/20/18 0623 10/21/18 0607  WBC 7.2 4.0 3.2*  NEUTROABS 5.6  --   --   HGB 11.4* 9.8* 9.2*  HCT 37.6* 32.4* 30.5*  MCV 66.3* 65.7* 65.3*  PLT PLATELET CLUMPS NOTED ON SMEAR, COUNT APPEARS ADEQUATE PLATELET CLUMPS NOTED ON SMEAR, COUNT APPEARS ADEQUATE 192   Cardiac Enzymes: No results for input(s): CKTOTAL, CKMB, CKMBINDEX, TROPONINI in the last 168 hours. BNP: BNP  (last 3 results) No results for input(s): BNP in the last 8760 hours.  ProBNP (last 3 results) No results for input(s): PROBNP in the last 8760 hours.  CBG: Recent Labs  Lab 10/23/18 0730 10/23/18 1142 10/23/18 1637 10/23/18 2058 10/24/18 0821  GLUCAP 178* 216* 72 185* 123*       Signed:  Domenic Polite MD.  [facility] 10/24/2018, 9:59 AM

## 2018-10-24 NOTE — Progress Notes (Signed)
Physical Therapy Treatment Patient Details Name: [name] MRN: [id number] DOB: August 03, 1951 Today's Date: 10/24/2018    History of Present Illness 68 y.o. male with medical history significant of prostate cancer s/p prostatectomy, GERD, DM2, HTN who presents for chills and myalgias.  He reports that he has been having issues with his feet for about 3 weeks.  xray of his foot which showed charcot type changes. MRI did not reveal evidence of osteomyelitis. s/p I&D of superficial ulceration of plantar lateral aspect of L foot 10/22/2018. Plan for silvadene dressing and strict nonweightbearing to maximize healing.      PT Comments    Patient seen for mobility progression. Pt is improving with ability to maintain NWB status with short distance gait training. Stair training again this session and pt continues to have difficulty ascending/descending 2 steps with RW and min/mod A and experienced LOB requiring assist to recover. Pt educated again that the safest way to negotiate 12 steps entering home and maintain NWB status for optimal healing will be to bump up on buttocks. Practiced standing from step with assistance of wife. Pt and wife agreeable that this will be the best way to enter home. Current plan remains appropriate.     Follow Up Recommendations  Home health PT;Supervision for mobility/OOB     Equipment Recommendations  Rolling walker with 5" wheels;3in1 (PT)    Recommendations for Other Services       Precautions / Restrictions Precautions Precautions: Fall Precaution Comments: NWB L LE Required Braces or Orthoses: Other Brace Other Brace: Post op shoe Restrictions Weight Bearing Restrictions: Yes LLE Weight Bearing: Non weight bearing    Mobility  Bed Mobility Overal bed mobility: Modified Independent             General bed mobility comments: HoB elevated and use of bed rail to pull to EoB  Transfers Overall transfer level: Needs assistance Equipment used:  Rolling walker (2 wheeled) Transfers: Sit to/from Stand Sit to Stand: Supervision         General transfer comment: cues for safe hand placement  Ambulation/Gait Ambulation/Gait assistance: Min guard Gait Distance (Feet): (~50 total during session) Assistive device: Rolling walker (2 wheeled) Gait Pattern/deviations: Trunk flexed;Step-to pattern Gait velocity: slowed   General Gait Details: cues for maintaining NWB status   Stairs   Stairs assistance: Min assist;Mod assist Stair Management: One rail Left;Step to pattern;Backwards;With walker;No rails Number of Stairs: 2 General stair comments: practiced 2 steps going backwards with RW; first step went well but ascending/descending second steps pt with LOB requiring increased assist to recover; pt educated again that the safest way for him to negotiate 12 steps while maintaining NWB status is to bump up backwards on buttocks; practiced standing from step using L rail and assistance from wife    Wheelchair Mobility    Modified Rankin (Stroke Patients Only)       Balance Overall balance assessment: Needs assistance Sitting-balance support: No upper extremity supported;Feet supported Sitting balance-Leahy Scale: Good     Standing balance support: Bilateral upper extremity supported Standing balance-Leahy Scale: Poor Standing balance comment: requires B UE support to maintain weightbearing                            Cognition Arousal/Alertness: Awake/alert Behavior During Therapy: WFL for tasks assessed/performed Overall Cognitive Status: Within Functional Limits for tasks assessed  Exercises      General Comments        Pertinent Vitals/Pain Pain Assessment: Faces Faces Pain Scale: Hurts little more Pain Location: L LE in dependent position  Pain Descriptors / Indicators: Aching;Sore Pain Intervention(s): Limited activity within patient's  tolerance;Monitored during session;Repositioned;Premedicated before session    Home Living                      Prior Function            PT Goals (current goals can now be found in the care plan section) Progress towards PT goals: Progressing toward goals    Frequency    Min 5X/week      PT Plan Current plan remains appropriate    Co-evaluation              AM-PAC PT "6 Clicks" Mobility   Outcome Measure  Help needed turning from your back to your side while in a flat bed without using bedrails?: None Help needed moving from lying on your back to sitting on the side of a flat bed without using bedrails?: None Help needed moving to and from a bed to a chair (including a wheelchair)?: A Little Help needed standing up from a chair using your arms (e.g., wheelchair or bedside chair)?: A Little Help needed to walk in hospital room?: A Little Help needed climbing 3-5 steps with a railing? : A Little 6 Click Score: 20    End of Session Equipment Utilized During Treatment: Gait belt Activity Tolerance: Patient tolerated treatment well Patient left: in chair;with call bell/phone within reach;with family/visitor present Nurse Communication: Mobility status;Weight bearing status;Precautions PT Visit Diagnosis: Unsteadiness on feet (R26.81);Other abnormalities of gait and mobility (R26.89);Muscle weakness (generalized) (M62.81);Difficulty in walking, not elsewhere classified (R26.2);Other symptoms and signs involving the nervous system (R29.898);Pain Pain - Right/Left: Left Pain - part of body: Ankle and joints of foot     Time: 1300-1339 PT Time Calculation (min) (ACUTE ONLY): 39 min  Charges:  $Gait Training: 38-52 mins                     Earney Navy, PTA Acute Rehabilitation Services Pager: [phone] Office: [phone]     Darliss Cheney 10/24/2018, 1:47 PM

## 2018-10-24 NOTE — Plan of Care (Signed)
  Problem: Education: Goal: Knowledge of General Education information will improve Description Including pain rating scale, medication(s)/side effects and non-pharmacologic comfort measures Outcome: Progressing   Problem: Activity: Goal: Risk for activity intolerance will decrease Outcome: Progressing   Problem: Pain Managment: Goal: General experience of comfort will improve Outcome: Progressing   Problem: Safety: Goal: Ability to remain free from injury will improve Outcome: Progressing   Problem: Skin Integrity: Goal: Risk for impaired skin integrity will decrease Outcome: Progressing   Problem: Skin Integrity: Goal: Risk for impaired skin integrity will decrease Outcome: Progressing

## 2018-10-26 ENCOUNTER — Ambulatory Visit: Payer: Medicare Other | Admitting: Orthotics

## 2018-11-01 ENCOUNTER — Telehealth: Payer: Self-pay | Admitting: Family Medicine

## 2018-11-01 NOTE — Telephone Encounter (Unsigned)
Copied from [place] [id number]. Topic: Quick Communication - Home Health Verbal Orders >> Nov 01, 2018  1:44 PM Carolyn Stare wrote: Caller/Agency Samyra with [facility]   Callback Number    [phone]   Requesting verbal orders to continue PT  Frequency:   2 x 3

## 2018-11-02 ENCOUNTER — Other Ambulatory Visit (INDEPENDENT_AMBULATORY_CARE_PROVIDER_SITE_OTHER): Payer: Self-pay

## 2018-11-02 ENCOUNTER — Telehealth (INDEPENDENT_AMBULATORY_CARE_PROVIDER_SITE_OTHER): Payer: Self-pay | Admitting: Orthopedic Surgery

## 2018-11-02 NOTE — Telephone Encounter (Signed)
Patient is here and wanted to get a note stating that he has been out of work since he was in the hospital until his appointment on Wednesday, March 25th.  Thank you.

## 2018-11-02 NOTE — Telephone Encounter (Signed)
Called and gave verbal orders for PT, she verbalized understanding.

## 2018-11-02 NOTE — Telephone Encounter (Signed)
Letter written per Dr. Jess Barters consult note in chart that anticipates that the pt will be out for a month. Given to pt while in office.

## 2018-11-07 ENCOUNTER — Encounter (INDEPENDENT_AMBULATORY_CARE_PROVIDER_SITE_OTHER): Payer: Self-pay | Admitting: Orthopedic Surgery

## 2018-11-07 ENCOUNTER — Other Ambulatory Visit: Payer: Self-pay

## 2018-11-07 ENCOUNTER — Other Ambulatory Visit (INDEPENDENT_AMBULATORY_CARE_PROVIDER_SITE_OTHER): Payer: Self-pay

## 2018-11-07 ENCOUNTER — Ambulatory Visit (INDEPENDENT_AMBULATORY_CARE_PROVIDER_SITE_OTHER): Payer: Medicare Other | Admitting: Orthopedic Surgery

## 2018-11-07 VITALS — Ht 66.0 in | Wt 162.0 lb

## 2018-11-07 DIAGNOSIS — E11621 Type 2 diabetes mellitus with foot ulcer: Secondary | ICD-10-CM | POA: Diagnosis not present

## 2018-11-07 DIAGNOSIS — L97421 Non-pressure chronic ulcer of left heel and midfoot limited to breakdown of skin: Secondary | ICD-10-CM | POA: Diagnosis not present

## 2018-11-07 NOTE — Progress Notes (Signed)
Office Visit Note   Patient: [name]           Date of Birth: [birth year]           MRN: [id number] Visit Date: 11/07/2018              Requested by: [name], MD [address] [place], [place] [address] PCP: [name], MD  Chief Complaint  Patient presents with  . Left Foot - Follow-up      HPI: Patient is a 68 year old gentleman who presents in follow-up with a Wagener grade 1 ulcer in the left midfoot with diabetic insensate neuropathy.  Patient states that he is at home using a postoperative shoe states he has a little bit of drainage  Assessment & Plan: Visit Diagnoses:  1. Diabetic ulcer of left midfoot associated with type 2 diabetes mellitus, limited to breakdown of skin ([place])     Plan: Recommended continuing with minimizing weightbearing on the left foot we will place a Hardin Negus document in his postoperative shoe to further relieve pressure over the ulcer.  He will continue with the Silvadene and cleansing daily.  Follow-Up Instructions: Return in about 2 weeks (around 11/21/2018).   Ortho Exam  Patient is alert, oriented, no adenopathy, well-dressed, normal affect, normal respiratory effort. Examination patient does have venous stasis swelling in the left lower extremity there is pitting edema no open ulcers from venous disease.  I cannot palpate a pulse but the Doppler was used and he has a triphasic dorsalis pedis and posterior tibial pulse.  He has a large Wegner grade 1 ulcer beneath the midfoot on the left.  After informed consent a 10 blade knife was used to debride skin and soft tissue and fibrinous tissue.  Silver nitrate was used for hemostasis.  Fibrinous exudative tissue was excised.  After debridement the ulcer is 3 cm in diameter 1 mm deep.  Iodosorb and a dry dressing are applied.  Imaging: No results found. No images are attached to the encounter.  Labs: Lab Results  Component Value Date   HGBA1C 13.9 (H) 10/19/2018   HGBA1C 11.9  (H) 07/18/2018   HGBA1C 16.0 (H) 09/13/2014   ESRSEDRATE 52 (H) 10/19/2018   CRP 9.9 (H) 10/19/2018   LABURIC 5.3 07/11/2010   REPTSTATUS 10/24/2018 FINAL 10/19/2018   CULT  10/19/2018    NO GROWTH 5 DAYS Performed at [facility], [place] [address], [place], [place] [address]      Lab Results  Component Value Date   ALBUMIN 3.5 10/19/2018   ALBUMIN 4.3 07/18/2018   ALBUMIN 3.5 12/29/2016   PREALBUMIN 10.9 (L) 10/19/2018   PREALBUMIN 11.7 (L) 08/30/2012   LABURIC 5.3 07/11/2010    Body mass index is 26.15 kg/m.  Orders:  No orders of the defined types were placed in this encounter.  No orders of the defined types were placed in this encounter.    Procedures: No procedures performed  Clinical Data: No additional findings.  ROS:  All other systems negative, except as noted in the HPI. Review of Systems  Objective: Vital Signs: Ht 5\' 6"  (1.676 m)   Wt 162 lb (73.5 kg)   BMI 26.15 kg/m   Specialty Comments:  No specialty comments available.  PMFS History: Patient Active Problem List   Diagnosis Date Noted  . Moderate protein-calorie malnutrition ([place])   . Diabetic foot ulcer ([place]) 10/19/2018  . Diabetic foot infection ([place])   . Adrenal insufficiency ([place]) 04/09/2015  .  Peripheral edema 09/17/2014  . DOE (dyspnea on exertion) 09/17/2014  . Bronchospasm, acute 09/16/2014  . Partial small bowel obstruction ([place]) 09/16/2014  . Hyperglycemia   . Diabetic retinopathy associated with type 2 diabetes mellitus ([place]) 09/13/2014  . Abdominal pain 09/13/2014  . Cough 11/15/2012  . Pharyngitis 09/14/2012  . Fluid overload 09/03/2012  . Ileus following gastrointestinal surgery ([place]) 09/03/2012  . Hypokalemia 08/30/2012  . Small bowel obstruction s/p LOA [id number] 08/21/2012  . Abnormal EKG 08/21/2012  . Chest pain 08/21/2012  . Macular degeneration (senile) of retina 12/15/2011  . Lens replaced 12/15/2011  . Vitreous hemorrhage ([place]) 12/15/2011  . Essential  hypertension 08/19/2008  . Regional enteritis of small intestine ([place]) 01/03/2008  . ERECTILE DYSFUNCTION, ORGANIC 01/03/2008  . Cellulitis and abscess of face 10/11/2007  . Sarcoidosis 07/19/2007  . Diabetes mellitus ([place]) 07/19/2007  . HYPERLIPIDEMIA, MIXED 07/19/2007  . CERUMEN IMPACTION, BILATERAL 07/19/2007  . URINARY INCONTINENCE, STRESS, MALE 08/17/2006  . PROSTATE CANCER, HX OF 08/17/2006  . DIABETIC  RETINOPATHY 01/09/2002   Past Medical History:  Diagnosis Date  . Diabetic retinopathy ([place])   . Diabetic retinopathy associated with type 2 diabetes mellitus ([place]) 09/13/2014   He works for [employer]  . Fluid overload 09/03/2012   Post op  . GERD (gastroesophageal reflux disease)   . Prostate cancer ([place])   . Visual impairment     Family History  Problem Relation Age of Onset  . Diabetes Mellitus II Mother   . Colon cancer Neg Hx     Past Surgical History:  Procedure Laterality Date  . CATARACT EXTRACTION W/ INTRAOCULAR LENS  IMPLANT, BILATERAL    . EYE SURGERY Bilateral    "laser OR for diabetic retinopathy"  . INGUINAL HERNIA REPAIR     [name] 07/12/2010), "don't remember which side"  . LAPAROTOMY  08/23/2012   Procedure: EXPLORATORY LAPAROTOMY;  Surgeon: [name], DO;  Location: [facility];  Service: General;  Laterality: N/A;  exploratory laparotomy with lysis of adhesions  . LYSIS OF ADHESION  08/23/2012   Procedure: LYSIS OF ADHESION;  Surgeon: [name], DO;  Location: [facility];  Service: General;;  . ROBOT ASSISTED LAPAROSCOPIC RADICAL PROSTATECTOMY  2000's   "had to finish manually after machine broke"  . SHOULDER SURGERY  1970's   separation; from playing football"   Social History   Occupational History  . Not on file  Tobacco Use  . Smoking status: Former Smoker    Packs/day: 2.50    Years: 3.00    Pack years: 7.50    Types: Cigarettes    Last attempt to quit: 11/09/1966    Years since quitting: 52.0  . Smokeless tobacco: Never Used   Substance and Sexual Activity  . Alcohol use: Yes    Comment: 04/08/2015 "maybe a beer/ or 2 or a glass of wine monthly"  . Drug use: No  . Sexual activity: Yes

## 2018-11-08 ENCOUNTER — Ambulatory Visit: Payer: Medicare Other | Admitting: Orthotics

## 2018-11-09 ENCOUNTER — Ambulatory Visit: Payer: Medicare Other | Admitting: Podiatry

## 2018-11-12 ENCOUNTER — Other Ambulatory Visit: Payer: Self-pay

## 2018-11-12 ENCOUNTER — Ambulatory Visit (INDEPENDENT_AMBULATORY_CARE_PROVIDER_SITE_OTHER): Payer: Medicare Other | Admitting: Orthotics

## 2018-11-12 VITALS — Temp 96.2°F

## 2018-11-12 DIAGNOSIS — E1142 Type 2 diabetes mellitus with diabetic polyneuropathy: Secondary | ICD-10-CM | POA: Diagnosis not present

## 2018-11-12 DIAGNOSIS — L84 Corns and callosities: Secondary | ICD-10-CM

## 2018-11-12 DIAGNOSIS — L97401 Non-pressure chronic ulcer of unspecified heel and midfoot limited to breakdown of skin: Secondary | ICD-10-CM | POA: Diagnosis not present

## 2018-11-12 DIAGNOSIS — E08621 Diabetes mellitus due to underlying condition with foot ulcer: Secondary | ICD-10-CM

## 2018-11-12 DIAGNOSIS — M2142 Flat foot [pes planus] (acquired), left foot: Secondary | ICD-10-CM

## 2018-11-12 DIAGNOSIS — M2141 Flat foot [pes planus] (acquired), right foot: Secondary | ICD-10-CM | POA: Diagnosis not present

## 2018-11-12 NOTE — Progress Notes (Signed)

## 2018-11-13 NOTE — Progress Notes (Signed)

## 2018-11-21 ENCOUNTER — Telehealth (INDEPENDENT_AMBULATORY_CARE_PROVIDER_SITE_OTHER): Payer: Self-pay

## 2018-11-21 NOTE — Telephone Encounter (Signed)
I called and sw pt he answered NO to al prescreen COVID-19 questions. Pt has an appt tomorrow at 1:45

## 2018-11-22 ENCOUNTER — Encounter (INDEPENDENT_AMBULATORY_CARE_PROVIDER_SITE_OTHER): Payer: Self-pay | Admitting: Physician Assistant

## 2018-11-22 ENCOUNTER — Encounter (INDEPENDENT_AMBULATORY_CARE_PROVIDER_SITE_OTHER): Payer: Self-pay | Admitting: Orthopedic Surgery

## 2018-11-22 ENCOUNTER — Other Ambulatory Visit: Payer: Self-pay

## 2018-11-22 ENCOUNTER — Ambulatory Visit (INDEPENDENT_AMBULATORY_CARE_PROVIDER_SITE_OTHER): Payer: Medicare Other | Admitting: Physician Assistant

## 2018-11-22 VITALS — Ht 66.0 in | Wt 162.0 lb

## 2018-11-22 DIAGNOSIS — E11621 Type 2 diabetes mellitus with foot ulcer: Secondary | ICD-10-CM

## 2018-11-22 DIAGNOSIS — L97421 Non-pressure chronic ulcer of left heel and midfoot limited to breakdown of skin: Secondary | ICD-10-CM | POA: Diagnosis not present

## 2018-11-22 MED ORDER — OXYCODONE HCL 5 MG PO TABS: 5.0000 mg | ORAL_TABLET | Freq: Four times a day (QID) | ORAL | 0 refills | Status: DC | PRN

## 2018-11-22 MED ORDER — SILVER SULFADIAZINE 1 % EX CREA: 1.0000 "application " | TOPICAL_CREAM | Freq: Every day | CUTANEOUS | 0 refills | Status: DC

## 2018-11-22 NOTE — Progress Notes (Signed)
Office Visit Note   Patient: [name]           Date of Birth: [birth year]           MRN: [id number] Visit Date: 11/22/2018              Requested by: [name], MD [address] [place], [place] [address] PCP: [name], MD  Chief Complaint  Patient presents with  . Left Foot - Follow-up      HPI: The patient is a 68 year old gentleman who is seen for follow-up of his left midfoot insensate diabetic ulcer, Wagner grade 1.  He is trying to minimize weightbearing as much as possible and has a doughnut in his postoperative shoe.  He states he is continuing to have pain over the area and edema of his feet and legs.  He has been utilizing Silvadene cream to the residual ulcer and dry dressings on top of this.  Assessment & Plan: Visit Diagnoses:  1. Diabetic ulcer of left midfoot associated with type 2 diabetes mellitus, limited to breakdown of skin ([place])     Plan: After informed consent the callus was debrided from the periwound area.  He is starting to get some mild epiboly over the wound edges and this was debrided to tolerance.  Follow-Up Instructions: Return in about 2 weeks (around 12/06/2018).   Ortho Exam  Patient is alert, oriented, no adenopathy, well-dressed, normal affect, normal respiratory effort. The left midfoot ulcer was debrided.  Overall dimensions of the wound are 1.6 x 2.6 x 0.2 cm with pale pink wound bed in the center.  The periwound area was debrided for a large amount of callus.  He is starting to get some epiboly of the wound edges.  He continues to have edema over the lower extremity.  He reports it is improved from previous.  There is no signs of cellulitis in the foot.  Imaging: No results found.   Labs: Lab Results  Component Value Date   HGBA1C 13.9 (H) 10/19/2018   HGBA1C 11.9 (H) 07/18/2018   HGBA1C 16.0 (H) 09/13/2014   ESRSEDRATE 52 (H) 10/19/2018   CRP 9.9 (H) 10/19/2018   LABURIC 5.3 07/11/2010   REPTSTATUS 10/24/2018  FINAL 10/19/2018   CULT  10/19/2018    NO GROWTH 5 DAYS Performed at [facility], [place] [address] [place], [place] [address]      Lab Results  Component Value Date   ALBUMIN 3.5 10/19/2018   ALBUMIN 4.3 07/18/2018   ALBUMIN 3.5 12/29/2016   PREALBUMIN 10.9 (L) 10/19/2018   PREALBUMIN 11.7 (L) 08/30/2012   LABURIC 5.3 07/11/2010    Body mass index is 26.15 kg/m.  Orders:  No orders of the defined types were placed in this encounter.  Meds ordered this encounter  Medications  . DISCONTD: oxyCODONE (OXY IR/ROXICODONE) 5 MG immediate release tablet    Sig: Take 1 tablet (5 mg total) by mouth every 6 (six) hours as needed for severe pain.    Dispense:  20 tablet    Refill:  0  . silver sulfADIAZINE (SILVADENE) 1 % cream    Sig: Apply 1 application topically daily. Left foot    Dispense:  50 g    Refill:  0  . oxyCODONE (OXY IR/ROXICODONE) 5 MG immediate release tablet    Sig: Take 1 tablet (5 mg total) by mouth every 6 (six) hours as needed for severe pain.    Dispense:  20 tablet  Refill:  0     Procedures: No procedures performed  Clinical Data: No additional findings.  ROS:  All other systems negative, except as noted in the HPI. Review of Systems  Objective: Vital Signs: Ht 5\' 6"  (1.676 m)   Wt 162 lb (73.5 kg)   BMI 26.15 kg/m   Specialty Comments:  No specialty comments available.  PMFS History: Patient Active Problem List   Diagnosis Date Noted  . Moderate protein-calorie malnutrition ([place])   . Diabetic foot ulcer ([place]) 10/19/2018  . Diabetic foot infection ([place])   . Adrenal insufficiency ([place]) 04/09/2015  . Peripheral edema 09/17/2014  . DOE (dyspnea on exertion) 09/17/2014  . Bronchospasm, acute 09/16/2014  . Partial small bowel obstruction ([place]) 09/16/2014  . Hyperglycemia   . Diabetic retinopathy associated with type 2 diabetes mellitus ([place]) 09/13/2014  . Abdominal pain 09/13/2014  . Cough 11/15/2012  . Pharyngitis 09/14/2012   . Fluid overload 09/03/2012  . Ileus following gastrointestinal surgery ([place]) 09/03/2012  . Hypokalemia 08/30/2012  . Small bowel obstruction s/p LOA [id number] 08/21/2012  . Abnormal EKG 08/21/2012  . Chest pain 08/21/2012  . Macular degeneration (senile) of retina 12/15/2011  . Lens replaced 12/15/2011  . Vitreous hemorrhage ([place]) 12/15/2011  . Essential hypertension 08/19/2008  . Regional enteritis of small intestine ([place]) 01/03/2008  . ERECTILE DYSFUNCTION, ORGANIC 01/03/2008  . Cellulitis and abscess of face 10/11/2007  . Sarcoidosis 07/19/2007  . Diabetes mellitus ([place]) 07/19/2007  . HYPERLIPIDEMIA, MIXED 07/19/2007  . CERUMEN IMPACTION, BILATERAL 07/19/2007  . URINARY INCONTINENCE, STRESS, MALE 08/17/2006  . PROSTATE CANCER, HX OF 08/17/2006  . DIABETIC  RETINOPATHY 01/09/2002   Past Medical History:  Diagnosis Date  . Diabetic retinopathy ([place])   . Diabetic retinopathy associated with type 2 diabetes mellitus ([place]) 09/13/2014   He works for [employer]  . Fluid overload 09/03/2012   Post op  . GERD (gastroesophageal reflux disease)   . Prostate cancer ([place])   . Visual impairment     Family History  Problem Relation Age of Onset  . Diabetes Mellitus II Mother   . Colon cancer Neg Hx     Past Surgical History:  Procedure Laterality Date  . CATARACT EXTRACTION W/ INTRAOCULAR LENS  IMPLANT, BILATERAL    . EYE SURGERY Bilateral    "laser OR for diabetic retinopathy"  . INGUINAL HERNIA REPAIR     [name] 07/12/2010), "don't remember which side"  . LAPAROTOMY  08/23/2012   Procedure: EXPLORATORY LAPAROTOMY;  Surgeon: [name], DO;  Location: [facility];  Service: General;  Laterality: N/A;  exploratory laparotomy with lysis of adhesions  . LYSIS OF ADHESION  08/23/2012   Procedure: LYSIS OF ADHESION;  Surgeon: [name], DO;  Location: [facility];  Service: General;;  . ROBOT ASSISTED LAPAROSCOPIC RADICAL PROSTATECTOMY  2000's   "had to finish manually after  machine broke"  . SHOULDER SURGERY  1970's   separation; from playing football"   Social History   Occupational History  . Not on file  Tobacco Use  . Smoking status: Former Smoker    Packs/day: 2.50    Years: 3.00    Pack years: 7.50    Types: Cigarettes    Last attempt to quit: 11/09/1966    Years since quitting: 52.0  . Smokeless tobacco: Never Used  Substance and Sexual Activity  . Alcohol use: Yes    Comment: 04/08/2015 "maybe a beer/ or 2 or a glass of wine monthly"  . Drug use:  No  . Sexual activity: Yes

## 2018-11-25 ENCOUNTER — Encounter (INDEPENDENT_AMBULATORY_CARE_PROVIDER_SITE_OTHER): Payer: Self-pay | Admitting: Physician Assistant

## 2018-12-04 ENCOUNTER — Encounter (INDEPENDENT_AMBULATORY_CARE_PROVIDER_SITE_OTHER): Payer: Self-pay | Admitting: Physician Assistant

## 2018-12-04 ENCOUNTER — Telehealth (INDEPENDENT_AMBULATORY_CARE_PROVIDER_SITE_OTHER): Payer: Self-pay | Admitting: Orthopedic Surgery

## 2018-12-04 NOTE — Telephone Encounter (Signed)
Patient states he needs a note for his employer that states his appointment for 12/06/18 was changed to 12/13/18. He also states that the solution he was told to purchase to clean his foot, he can not find and was told to get a Rx for the solution by his pharmacy.

## 2018-12-04 NOTE — Telephone Encounter (Signed)
I did not tell him to use anything to clean the wound but Dial soap and water and then to use silvadene cream to the area.I might have told him to use some Amlactin lotion to the rest of the very dry foot, but not to the ulcer area.This is not prescription lotion, but I don't know what solution he is thinking about.

## 2018-12-04 NOTE — Telephone Encounter (Signed)
Shawn please advise. I do not see any information pertaining to a solution for this patient. Do you remember telling him this, thanks?

## 2018-12-06 ENCOUNTER — Ambulatory Visit (INDEPENDENT_AMBULATORY_CARE_PROVIDER_SITE_OTHER): Payer: Medicare Other | Admitting: Orthopedic Surgery

## 2018-12-06 NOTE — Telephone Encounter (Signed)
Pt was called and advised of message below and to also pick up note for employer. Pt understood.

## 2018-12-13 ENCOUNTER — Ambulatory Visit (INDEPENDENT_AMBULATORY_CARE_PROVIDER_SITE_OTHER): Payer: Medicare Other | Admitting: Physician Assistant

## 2018-12-13 ENCOUNTER — Encounter (INDEPENDENT_AMBULATORY_CARE_PROVIDER_SITE_OTHER): Payer: Self-pay | Admitting: Physician Assistant

## 2018-12-13 ENCOUNTER — Ambulatory Visit (INDEPENDENT_AMBULATORY_CARE_PROVIDER_SITE_OTHER): Payer: Medicare Other

## 2018-12-13 ENCOUNTER — Other Ambulatory Visit: Payer: Self-pay

## 2018-12-13 ENCOUNTER — Encounter (INDEPENDENT_AMBULATORY_CARE_PROVIDER_SITE_OTHER): Payer: Self-pay | Admitting: Orthopedic Surgery

## 2018-12-13 VITALS — Ht 66.0 in | Wt 162.0 lb

## 2018-12-13 DIAGNOSIS — E11621 Type 2 diabetes mellitus with foot ulcer: Secondary | ICD-10-CM | POA: Diagnosis not present

## 2018-12-13 DIAGNOSIS — E11610 Type 2 diabetes mellitus with diabetic neuropathic arthropathy: Secondary | ICD-10-CM | POA: Diagnosis not present

## 2018-12-13 DIAGNOSIS — L97421 Non-pressure chronic ulcer of left heel and midfoot limited to breakdown of skin: Secondary | ICD-10-CM | POA: Diagnosis not present

## 2018-12-13 MED ORDER — DOXYCYCLINE HYCLATE 100 MG PO CAPS: 100.0000 mg | ORAL_CAPSULE | Freq: Two times a day (BID) | ORAL | 1 refills | Status: DC

## 2018-12-13 NOTE — Progress Notes (Signed)
Office Visit Note   Patient: [name]           Date of Birth: 13-Oct-1950           MRN: [id number] Visit Date: 12/13/2018              Requested by: [name], MD [address] [place], [place] [address] PCP: [name], MD  Chief Complaint  Patient presents with  . Left Foot - Follow-up      HPI: The patient is a 68 yo gentleman who is seen for follow up of his wagoner grade 1 ulcer over the plantar surface of his left foot with diabetic insensate neuropathy and some early charcot deformity. He ambulates with a post operative shoe and walker.   Assessment & Plan: Visit Diagnoses:  1. Diabetic ulcer of left midfoot associated with type 2 diabetes mellitus, limited to breakdown of skin ([place])   2. Diabetic Charct's arthropathy ([place])     Plan: Will start Doxycycline 100 mg BID x 14 days.  After informed consent, the left foot ulcer and calluses were debrided with a #10 blade knife and hemostasis was achieved with silver nitrate. Additional donut was placed in his post operative shoe to off load the area and the patient was instructed to elevate and off load the foot at home and minimize walking as much as possible. He can continue silvadene cream to the left foot daily until he obtains medical compression socks and then he can start wearing the sock around the clock after showering. He will need to remain out of work for at least an additional 4 weeks.   Follow-Up Instructions: Return in about 2 weeks (around 12/27/2018).   Ortho Exam  Patient is alert, oriented, no adenopathy, well-dressed, normal affect, normal respiratory effort. After informed consent the left mid foot plantar ulcer was debrided with a #10 blade knife to healthy bleeding tissue and hemostasis was achieved with silver nitrate. There is moderate callus and loose skin over the entire midfoot which was debrided as well.  He is very tender to palpation over the 5th metatarsal head. There is  moderate edema of the foot and pitting edema over the left calf. Some brown tan drainage with odor. Mild erythema.  Pedal pulses are biphasic by doppler.   Imaging: No results found. No images are attached to the encounter.  Labs: Lab Results  Component Value Date   HGBA1C 13.9 (H) 10/19/2018   HGBA1C 11.9 (H) 07/18/2018   HGBA1C 16.0 (H) 09/13/2014   ESRSEDRATE 52 (H) 10/19/2018   CRP 9.9 (H) 10/19/2018   LABURIC 5.3 07/11/2010   REPTSTATUS 10/24/2018 FINAL 10/19/2018   CULT  10/19/2018    NO GROWTH 5 DAYS Performed at [facility], [place] [address] [place], [place] [address]      Lab Results  Component Value Date   ALBUMIN 3.5 10/19/2018   ALBUMIN 4.3 07/18/2018   ALBUMIN 3.5 12/29/2016   PREALBUMIN 10.9 (L) 10/19/2018   PREALBUMIN 11.7 (L) 08/30/2012   LABURIC 5.3 07/11/2010    Body mass index is 26.15 kg/m.  Orders:  Orders Placed This Encounter  Procedures  . XR Foot Complete Left   Meds ordered this encounter  Medications  . doxycycline (VIBRAMYCIN) 100 MG capsule    Sig: Take 1 capsule (100 mg total) by mouth 2 (two) times daily.    Dispense:  28 capsule    Refill:  1     Procedures: No  procedures performed  Clinical Data: No additional findings.  ROS:  All other systems negative, except as noted in the HPI. Review of Systems  Objective: Vital Signs: Ht 5\' 6"  (1.676 m)   Wt 162 lb (73.5 kg)   BMI 26.15 kg/m   Specialty Comments:  No specialty comments available.  PMFS History: Patient Active Problem List   Diagnosis Date Noted  . Moderate protein-calorie malnutrition ([place])   . Diabetic foot ulcer ([place]) 10/19/2018  . Diabetic foot infection ([place])   . Adrenal insufficiency ([place]) 04/09/2015  . Peripheral edema 09/17/2014  . DOE (dyspnea on exertion) 09/17/2014  . Bronchospasm, acute 09/16/2014  . Partial small bowel obstruction ([place]) 09/16/2014  . Hyperglycemia   . Diabetic retinopathy associated with type 2 diabetes mellitus  ([place]) 09/13/2014  . Abdominal pain 09/13/2014  . Cough 11/15/2012  . Pharyngitis 09/14/2012  . Fluid overload 09/03/2012  . Ileus following gastrointestinal surgery ([place]) 09/03/2012  . Hypokalemia 08/30/2012  . Small bowel obstruction s/p LOA [id number] 08/21/2012  . Abnormal EKG 08/21/2012  . Chest pain 08/21/2012  . Macular degeneration (senile) of retina 12/15/2011  . Lens replaced 12/15/2011  . Vitreous hemorrhage ([place]) 12/15/2011  . Essential hypertension 08/19/2008  . Regional enteritis of small intestine ([place]) 01/03/2008  . ERECTILE DYSFUNCTION, ORGANIC 01/03/2008  . Cellulitis and abscess of face 10/11/2007  . Sarcoidosis 07/19/2007  . Diabetes mellitus ([place]) 07/19/2007  . HYPERLIPIDEMIA, MIXED 07/19/2007  . CERUMEN IMPACTION, BILATERAL 07/19/2007  . URINARY INCONTINENCE, STRESS, MALE 08/17/2006  . PROSTATE CANCER, HX OF 08/17/2006  . DIABETIC  RETINOPATHY 01/09/2002   Past Medical History:  Diagnosis Date  . Diabetic retinopathy ([place])   . Diabetic retinopathy associated with type 2 diabetes mellitus ([place]) 09/13/2014   He works for [employer]  . Fluid overload 09/03/2012   Post op  . GERD (gastroesophageal reflux disease)   . Prostate cancer ([place])   . Visual impairment     Family History  Problem Relation Age of Onset  . Diabetes Mellitus II Mother   . Colon cancer Neg Hx     Past Surgical History:  Procedure Laterality Date  . CATARACT EXTRACTION W/ INTRAOCULAR LENS  IMPLANT, BILATERAL    . EYE SURGERY Bilateral    "laser OR for diabetic retinopathy"  . INGUINAL HERNIA REPAIR     [name] 07/12/2010), "don't remember which side"  . LAPAROTOMY  08/23/2012   Procedure: EXPLORATORY LAPAROTOMY;  Surgeon: [name], DO;  Location: [facility];  Service: General;  Laterality: N/A;  exploratory laparotomy with lysis of adhesions  . LYSIS OF ADHESION  08/23/2012   Procedure: LYSIS OF ADHESION;  Surgeon: [name], DO;  Location: [facility];  Service: General;;  .  ROBOT ASSISTED LAPAROSCOPIC RADICAL PROSTATECTOMY  2000's   "had to finish manually after machine broke"  . SHOULDER SURGERY  1970's   separation; from playing football"   Social History   Occupational History  . Not on file  Tobacco Use  . Smoking status: Former Smoker    Packs/day: 2.50    Years: 3.00    Pack years: 7.50    Types: Cigarettes    Last attempt to quit: 11/09/1966    Years since quitting: 52.1  . Smokeless tobacco: Never Used  Substance and Sexual Activity  . Alcohol use: Yes    Comment: 04/08/2015 "maybe a beer/ or 2 or a glass of wine monthly"  . Drug use: No  . Sexual activity: Yes

## 2018-12-17 ENCOUNTER — Telehealth: Payer: Self-pay

## 2018-12-17 NOTE — Telephone Encounter (Signed)
Forms faxed to ortho to medical records dpt attn Rayburn PA-C

## 2018-12-28 ENCOUNTER — Ambulatory Visit (INDEPENDENT_AMBULATORY_CARE_PROVIDER_SITE_OTHER): Payer: Medicare Other | Admitting: Physician Assistant

## 2018-12-28 ENCOUNTER — Encounter: Payer: Self-pay | Admitting: Physician Assistant

## 2018-12-28 ENCOUNTER — Other Ambulatory Visit: Payer: Self-pay

## 2018-12-28 VITALS — Ht 66.0 in | Wt 162.0 lb

## 2018-12-28 DIAGNOSIS — L97421 Non-pressure chronic ulcer of left heel and midfoot limited to breakdown of skin: Secondary | ICD-10-CM | POA: Diagnosis not present

## 2018-12-28 DIAGNOSIS — E11621 Type 2 diabetes mellitus with foot ulcer: Secondary | ICD-10-CM | POA: Diagnosis not present

## 2018-12-28 DIAGNOSIS — E11610 Type 2 diabetes mellitus with diabetic neuropathic arthropathy: Secondary | ICD-10-CM

## 2018-12-28 NOTE — Progress Notes (Signed)
Office Visit Note   Patient: [name]           Date of Birth: [birth year]           MRN: [id number] Visit Date: 12/28/2018              Requested by: [name], MD [address] [place], [place] [address] PCP: [name], MD  Chief Complaint  Patient presents with  . Left Foot - Follow-up      HPI: The patient is a 68 yo gentleman who is seen for follow up of a left mid foot plantar ulcer. He has been wearing his medical compression socks and off loading the area as much as possible. He ambulates with a post op shoe and walker.  He has diabetic neuropathy. He did travel to see some family by car and noted increased swelling this week. He is taking Doxycycline 100 mg BID.   Assessment & Plan: Visit Diagnoses:  1. Diabetic ulcer of left midfoot associated with type 2 diabetes mellitus, limited to breakdown of skin ([place])   2. Diabetic Charct's arthropathy ([place])     Plan: Continue Doxycycline 100 mg BID until completed.  Silvadene to wound daily after washing the foot with soap and water. Elevate and off load as much as possible and new felt donut supplied for post op shoe to help off load the area. Follow up in 2 weeks.  Note for work to remain out through 01/28/2019.   Follow-Up Instructions: Return in about 2 weeks (around 01/11/2019).   Ortho Exam  Patient is alert, oriented, no adenopathy, well-dressed, normal affect, normal respiratory effort. The left mid foot ulcer was debrided of skin, callus and soft tissue with a #10 blade knife after informed consent and the patient tolerated this well. Silver nitrate was used for hemostasis. The ulcer is ~ 1.5 cm diameter and 2 mm depth. No signs of cellulitis of the foot. Palpable pedal pulses. Silvadene dressing was applied.   Imaging: No results found. No images are attached to the encounter.  Labs: Lab Results  Component Value Date   HGBA1C 13.9 (H) 10/19/2018   HGBA1C 11.9 (H) 07/18/2018   HGBA1C 16.0 (H)  09/13/2014   ESRSEDRATE 52 (H) 10/19/2018   CRP 9.9 (H) 10/19/2018   LABURIC 5.3 07/11/2010   REPTSTATUS 10/24/2018 FINAL 10/19/2018   CULT  10/19/2018    NO GROWTH 5 DAYS Performed at [facility], [place] [address], [place], [place] [address]      Lab Results  Component Value Date   ALBUMIN 3.5 10/19/2018   ALBUMIN 4.3 07/18/2018   ALBUMIN 3.5 12/29/2016   PREALBUMIN 10.9 (L) 10/19/2018   PREALBUMIN 11.7 (L) 08/30/2012   LABURIC 5.3 07/11/2010    Body mass index is 26.15 kg/m.  Orders:  No orders of the defined types were placed in this encounter.  No orders of the defined types were placed in this encounter.    Procedures: No procedures performed  Clinical Data: No additional findings.  ROS:  All other systems negative, except as noted in the HPI. Review of Systems  Objective: Vital Signs: Ht 5\' 6"  (1.676 m)   Wt 162 lb (73.5 kg)   BMI 26.15 kg/m   Specialty Comments:  No specialty comments available.  PMFS History: Patient Active Problem List   Diagnosis Date Noted  . Moderate protein-calorie malnutrition ([place])   . Diabetic foot ulcer ([place]) 10/19/2018  . Diabetic foot infection ([place])   .  Adrenal insufficiency ([place]) 04/09/2015  . Peripheral edema 09/17/2014  . DOE (dyspnea on exertion) 09/17/2014  . Bronchospasm, acute 09/16/2014  . Partial small bowel obstruction ([place]) 09/16/2014  . Hyperglycemia   . Diabetic retinopathy associated with type 2 diabetes mellitus ([place]) 09/13/2014  . Abdominal pain 09/13/2014  . Cough 11/15/2012  . Pharyngitis 09/14/2012  . Fluid overload 09/03/2012  . Ileus following gastrointestinal surgery ([place]) 09/03/2012  . Hypokalemia 08/30/2012  . Small bowel obstruction s/p LOA [id number] 08/21/2012  . Abnormal EKG 08/21/2012  . Chest pain 08/21/2012  . Macular degeneration (senile) of retina 12/15/2011  . Lens replaced 12/15/2011  . Vitreous hemorrhage ([place]) 12/15/2011  . Essential hypertension 08/19/2008  .  Regional enteritis of small intestine ([place]) 01/03/2008  . ERECTILE DYSFUNCTION, ORGANIC 01/03/2008  . Cellulitis and abscess of face 10/11/2007  . Sarcoidosis 07/19/2007  . Diabetes mellitus ([place]) 07/19/2007  . HYPERLIPIDEMIA, MIXED 07/19/2007  . CERUMEN IMPACTION, BILATERAL 07/19/2007  . URINARY INCONTINENCE, STRESS, MALE 08/17/2006  . PROSTATE CANCER, HX OF 08/17/2006  . DIABETIC  RETINOPATHY 01/09/2002   Past Medical History:  Diagnosis Date  . Diabetic retinopathy ([place])   . Diabetic retinopathy associated with type 2 diabetes mellitus ([place]) 09/13/2014   He works for [employer]  . Fluid overload 09/03/2012   Post op  . GERD (gastroesophageal reflux disease)   . Prostate cancer ([place])   . Visual impairment     Family History  Problem Relation Age of Onset  . Diabetes Mellitus II Mother   . Colon cancer Neg Hx     Past Surgical History:  Procedure Laterality Date  . CATARACT EXTRACTION W/ INTRAOCULAR LENS  IMPLANT, BILATERAL    . EYE SURGERY Bilateral    "laser OR for diabetic retinopathy"  . INGUINAL HERNIA REPAIR     [name] 07/12/2010), "don't remember which side"  . LAPAROTOMY  08/23/2012   Procedure: EXPLORATORY LAPAROTOMY;  Surgeon: [name], DO;  [facility];  Service: General;  Laterality: N/A;  exploratory laparotomy with lysis of adhesions  . LYSIS OF ADHESION  08/23/2012   Procedure: LYSIS OF ADHESION;  Surgeon: [name], DO;  [facility];  Service: General;;  . ROBOT ASSISTED LAPAROSCOPIC RADICAL PROSTATECTOMY  2000's   "had to finish manually after machine broke"  . SHOULDER SURGERY  1970's   separation; from playing football"   Social History   Occupational History  . Not on file  Tobacco Use  . Smoking status: Former Smoker    Packs/day: 2.50    Years: 3.00    Pack years: 7.50    Types: Cigarettes    Last attempt to quit: 11/09/1966    Years since quitting: 52.1  . Smokeless tobacco: Never Used  Substance and Sexual  Activity  . Alcohol use: Yes    Comment: 04/08/2015 "maybe a beer/ or 2 or a glass of wine monthly"  . Drug use: No  . Sexual activity: Yes

## 2019-01-01 ENCOUNTER — Telehealth: Payer: Self-pay | Admitting: Orthopedic Surgery

## 2019-01-01 NOTE — Telephone Encounter (Signed)
Patient called requesting copy of 12/28/2018 ov note. He wants to pick up today. He is to come by office to sign release form and I'll have note ready or him.

## 2019-01-03 ENCOUNTER — Telehealth: Payer: Self-pay | Admitting: Family Medicine

## 2019-01-03 NOTE — Telephone Encounter (Signed)
Copied from [place] [phone] [phone]. Topic: Quick Communication - Home Health Verbal Orders >> Jan 03, 2019 12:11 PM Richardo Priest, [place] wrote: Caller/Agency: [facility] Number: [phone] and it is ok to leave VM Requesting OT/PT/Skilled Nursing/Social Work/Speech Therapy: skilled nursing (wound care) Frequency: 2 week discharge plan or as needed for the wound to completely go away. Currently 1.5 by 1.3

## 2019-01-03 NOTE — Telephone Encounter (Signed)
Orders approved for every other wk

## 2019-01-16 ENCOUNTER — Telehealth: Payer: Self-pay | Admitting: Physician Assistant

## 2019-01-16 ENCOUNTER — Other Ambulatory Visit: Payer: Self-pay

## 2019-01-16 DIAGNOSIS — E11621 Type 2 diabetes mellitus with foot ulcer: Secondary | ICD-10-CM

## 2019-01-16 MED ORDER — SILVER SULFADIAZINE 1 % EX CREA: 1.0000 "application " | TOPICAL_CREAM | Freq: Every day | CUTANEOUS | 0 refills | Status: DC

## 2019-01-16 NOTE — Telephone Encounter (Signed)
Rx was refilled and patient will be notified.

## 2019-01-16 NOTE — Telephone Encounter (Signed)
Patient called and requested a refill for SILVADENE. Was hoping to get it today.  Please call patient to [phone]

## 2019-01-29 ENCOUNTER — Other Ambulatory Visit: Payer: Self-pay

## 2019-01-29 ENCOUNTER — Encounter: Payer: Self-pay | Admitting: Physician Assistant

## 2019-01-29 ENCOUNTER — Ambulatory Visit (INDEPENDENT_AMBULATORY_CARE_PROVIDER_SITE_OTHER): Payer: Medicare Other | Admitting: Physician Assistant

## 2019-01-29 DIAGNOSIS — E11621 Type 2 diabetes mellitus with foot ulcer: Secondary | ICD-10-CM

## 2019-01-29 DIAGNOSIS — L97421 Non-pressure chronic ulcer of left heel and midfoot limited to breakdown of skin: Secondary | ICD-10-CM

## 2019-01-29 DIAGNOSIS — E11610 Type 2 diabetes mellitus with diabetic neuropathic arthropathy: Secondary | ICD-10-CM

## 2019-01-29 NOTE — Progress Notes (Signed)
Office Visit Note   Patient: [name]           Date of Birth: 03/07/1951           MRN: [id number] Visit Date: 01/29/2019              Requested by: [name], MD [address] [place],  [place] [address] PCP: [name], MD  No chief complaint on file.     HPI: The patient is a 68 yo gentleman with diabetic insensate polyneuropathy who is seen for follow up of a left mid foot plantar ulcer. He has been wearing a medical compression sock, but dressing with ulcer with silvadene cream and walking with a walker and felt donut in a post op shoe. He is completing a course of Doxycycline.  The patient notes daily tan blood tinged drainage from the area without odor.   Assessment & Plan: Visit Diagnoses:  1. Diabetic ulcer of left midfoot associated with type 2 diabetes mellitus, limited to breakdown of skin ([place])   2. Diabetic Charct's arthropathy ([place])     Plan: The left mid foot ulcer was debrided to healthy bleeding tissue and hemostasis was achieved using silver nitrate and local pressure per Dr. [name].  Dr . [name] discussed that the ulcer is getting larger and the patient needs to off load the foot as much as possible.  Larger felt donut added to the post op shoe to help off load the area and again discussed with the patient the need to off load and elevate the foot as much as possible.  Continue medical compression sock.  Follow up in 2 weeks.   Follow-Up Instructions: Return in about 2 weeks (around 02/12/2019).   Ortho Exam  Patient is alert, oriented, no adenopathy, well-dressed, normal affect, normal respiratory effort. The left mid foot ulceration is larger today ~ 3.5 to 4 cm diameter and 3-4 mm depth without exposed tendon or bone. The ulcer was debrided to healthy appearing bleeding tissue and hemostasis was achieved with pressure and silver nitrate. Weakly palpable pedal pulses . No signs of cellulitis of the foot. Tender to palpation over the base of  the 5th metatarsal.   Imaging: No results found. No images are attached to the encounter.  Labs: Lab Results  Component Value Date   HGBA1C 13.9 (H) 10/19/2018   HGBA1C 11.9 (H) 07/18/2018   HGBA1C 16.0 (H) 09/13/2014   ESRSEDRATE 52 (H) 10/19/2018   CRP 9.9 (H) 10/19/2018   LABURIC 5.3 07/11/2010   REPTSTATUS 10/24/2018 FINAL 10/19/2018   CULT  10/19/2018    NO GROWTH 5 DAYS Performed at [facility], [place] [address] [place], [place] [address]      Lab Results  Component Value Date   ALBUMIN 3.5 10/19/2018   ALBUMIN 4.3 07/18/2018   ALBUMIN 3.5 12/29/2016   PREALBUMIN 10.9 (L) 10/19/2018   PREALBUMIN 11.7 (L) 08/30/2012   LABURIC 5.3 07/11/2010    There is no height or weight on file to calculate BMI.  Orders:  No orders of the defined types were placed in this encounter.  No orders of the defined types were placed in this encounter.    Procedures: No procedures performed  Clinical Data: No additional findings.  ROS:  All other systems negative, except as noted in the HPI. Review of Systems  Objective: Vital Signs: There were no vitals taken for this visit.  Specialty Comments:  No specialty comments available.  PMFS History:  Patient Active Problem List   Diagnosis Date Noted  . Moderate protein-calorie malnutrition ([place])   . Diabetic foot ulcer ([place]) 10/19/2018  . Diabetic foot infection ([place])   . Adrenal insufficiency ([place]) 04/09/2015  . Peripheral edema 09/17/2014  . DOE (dyspnea on exertion) 09/17/2014  . Bronchospasm, acute 09/16/2014  . Partial small bowel obstruction ([place]) 09/16/2014  . Hyperglycemia   . Diabetic retinopathy associated with type 2 diabetes mellitus ([place]) 09/13/2014  . Abdominal pain 09/13/2014  . Cough 11/15/2012  . Pharyngitis 09/14/2012  . Fluid overload 09/03/2012  . Ileus following gastrointestinal surgery ([place]) 09/03/2012  . Hypokalemia 08/30/2012  . Small bowel obstruction s/p LOA [id number] 08/21/2012  .  Abnormal EKG 08/21/2012  . Chest pain 08/21/2012  . Macular degeneration (senile) of retina 12/15/2011  . Lens replaced 12/15/2011  . Vitreous hemorrhage ([place]) 12/15/2011  . Essential hypertension 08/19/2008  . Regional enteritis of small intestine ([place]) 01/03/2008  . ERECTILE DYSFUNCTION, ORGANIC 01/03/2008  . Cellulitis and abscess of face 10/11/2007  . Sarcoidosis 07/19/2007  . Diabetes mellitus ([place]) 07/19/2007  . HYPERLIPIDEMIA, MIXED 07/19/2007  . CERUMEN IMPACTION, BILATERAL 07/19/2007  . URINARY INCONTINENCE, STRESS, MALE 08/17/2006  . PROSTATE CANCER, HX OF 08/17/2006  . DIABETIC  RETINOPATHY 01/09/2002   Past Medical History:  Diagnosis Date  . Diabetic retinopathy ([place])   . Diabetic retinopathy associated with type 2 diabetes mellitus ([place]) 09/13/2014   He works for [employer]  . Fluid overload 09/03/2012   Post op  . GERD (gastroesophageal reflux disease)   . Prostate cancer ([place])   . Visual impairment     Family History  Problem Relation Age of Onset  . Diabetes Mellitus II Mother   . Colon cancer Neg Hx     Past Surgical History:  Procedure Laterality Date  . CATARACT EXTRACTION W/ INTRAOCULAR LENS  IMPLANT, BILATERAL    . EYE SURGERY Bilateral    "laser OR for diabetic retinopathy"  . INGUINAL HERNIA REPAIR     [name] 07/12/2010), "don't remember which side"  . LAPAROTOMY  08/23/2012   Procedure: EXPLORATORY LAPAROTOMY;  Surgeon: [name], DO;  Location: [facility];  Service: General;  Laterality: N/A;  exploratory laparotomy with lysis of adhesions  . LYSIS OF ADHESION  08/23/2012   Procedure: LYSIS OF ADHESION;  Surgeon: [name], DO;  Location[facility];  Service: General;;  . ROBOT ASSISTED LAPAROSCOPIC RADICAL PROSTATECTOMY  2000's   "had to finish manually after machine broke"  . SHOULDER SURGERY  1970's   separation; from playing football"   Social History   Occupational History  . Not on file  Tobacco Use  . Smoking status: Former  Smoker    Packs/day: 2.50    Years: 3.00    Pack years: 7.50    Types: Cigarettes    Quit date: 11/09/1966    Years since quitting: 52.2  . Smokeless tobacco: Never Used  Substance and Sexual Activity  . Alcohol use: Yes    Comment: 04/08/2015 "maybe a beer/ or 2 or a glass of wine monthly"  . Drug use: No  . Sexual activity: Yes

## 2019-01-31 ENCOUNTER — Telehealth: Payer: Self-pay | Admitting: Physician Assistant

## 2019-01-31 NOTE — Telephone Encounter (Signed)
Received call from [place] with [facility] stating she did wound care yesterday and need to know about the appointment patient had 01/29/2019. She advised patient could not tell her what was said at the appointment. The number to contact [facility] is [phone]

## 2019-02-01 ENCOUNTER — Telehealth: Payer: Self-pay | Admitting: Orthopedic Surgery

## 2019-02-01 NOTE — Telephone Encounter (Signed)
Pt called in said at his recent visit 01-29-2019 he left some paperwork with someone said he needed them filled out for his disability and he was told it was going to be filled out? [phone]

## 2019-02-01 NOTE — Telephone Encounter (Signed)
Called pt and advised that the paperwork was completed today and is pending signature on Monday. Advised I will call once the paperwork is ready for pick up will hold message until then.

## 2019-02-01 NOTE — Telephone Encounter (Signed)
[facility] was called and advised about patient wound care for left midfoot. Patient is to offload as much as possible with elevation and to use silvadene cream for ulcer.

## 2019-02-04 NOTE — Telephone Encounter (Signed)
Called and sw pt to advise that the fors have been faxed and originals mailed. A copy is at the front desk for pick up whenever he has time to come in.

## 2019-02-05 ENCOUNTER — Telehealth: Payer: Self-pay | Admitting: Orthopedic Surgery

## 2019-02-05 ENCOUNTER — Other Ambulatory Visit: Payer: Self-pay

## 2019-02-05 DIAGNOSIS — E11621 Type 2 diabetes mellitus with foot ulcer: Secondary | ICD-10-CM

## 2019-02-05 MED ORDER — DOXYCYCLINE HYCLATE 100 MG PO CAPS: 100.0000 mg | ORAL_CAPSULE | Freq: Two times a day (BID) | ORAL | 1 refills | Status: DC

## 2019-02-05 NOTE — Telephone Encounter (Signed)
Med's were refilled and faxed to pharmacy

## 2019-02-05 NOTE — Telephone Encounter (Signed)
Shawn please advise if patient needs anymore ABX's at this time. Thank you

## 2019-02-05 NOTE — Telephone Encounter (Signed)
Pt called in requesting a refill on his antibiotic. He doesn't know the name of it.   [phone]

## 2019-02-05 NOTE — Telephone Encounter (Signed)
Okay to refill doxycycline

## 2019-02-07 ENCOUNTER — Ambulatory Visit (INDEPENDENT_AMBULATORY_CARE_PROVIDER_SITE_OTHER): Payer: Medicare Other | Admitting: Family Medicine

## 2019-02-07 ENCOUNTER — Other Ambulatory Visit: Payer: Self-pay

## 2019-02-07 ENCOUNTER — Encounter: Payer: Self-pay | Admitting: Family Medicine

## 2019-02-07 VITALS — BP 140/90 | HR 65 | Temp 97.8°F | Ht 66.0 in | Wt 176.0 lb

## 2019-02-07 DIAGNOSIS — I10 Essential (primary) hypertension: Secondary | ICD-10-CM | POA: Diagnosis not present

## 2019-02-07 DIAGNOSIS — Z794 Long term (current) use of insulin: Secondary | ICD-10-CM | POA: Diagnosis not present

## 2019-02-07 DIAGNOSIS — E782 Mixed hyperlipidemia: Secondary | ICD-10-CM

## 2019-02-07 DIAGNOSIS — E08621 Diabetes mellitus due to underlying condition with foot ulcer: Secondary | ICD-10-CM

## 2019-02-07 DIAGNOSIS — N183 Chronic kidney disease, stage 3 unspecified: Secondary | ICD-10-CM

## 2019-02-07 DIAGNOSIS — R9439 Abnormal result of other cardiovascular function study: Secondary | ICD-10-CM

## 2019-02-07 DIAGNOSIS — E1165 Type 2 diabetes mellitus with hyperglycemia: Secondary | ICD-10-CM

## 2019-02-07 DIAGNOSIS — L97421 Non-pressure chronic ulcer of left heel and midfoot limited to breakdown of skin: Secondary | ICD-10-CM

## 2019-02-07 DIAGNOSIS — H6123 Impacted cerumen, bilateral: Secondary | ICD-10-CM

## 2019-02-07 LAB — POCT GLYCOSYLATED HEMOGLOBIN (HGB A1C): Hemoglobin A1C: 12 % — AB (ref 4.0–5.6)

## 2019-02-07 MED ORDER — INSULIN ISOPHANE & REGULAR (HUMAN 70-30)100 UNIT/ML KWIKPEN: 20.0000 [IU] | PEN_INJECTOR | Freq: Two times a day (BID) | SUBCUTANEOUS | 0 refills | Status: DC

## 2019-02-07 NOTE — Progress Notes (Signed)
6/25/202011:22 AM  [name] October 31, 1950, 68 y.o., male [id number]  Chief Complaint  Patient presents with  . Pain    foot procedure in Feb, going bk to see Dr. [name] on Monday   . Ear Problem    says feel slike he can not hear    HPI:   Patient is a 68 y.o. male with past medical history significant for HTN, DM2 with complications, HLPwho presents today for followup  Last OV Dec 2019  - changed lantus to toujeo, started trulicity. Trying to simply regime Saw optho in Feb - proliferative retonopathy with edema, both eyes Saw cards - nuclear stress test done, small reversible defect of lateral wall, LVEF 63% Admitted in march for diabetic foot ulcer, dm meds changed to insulin 70/30 due to cost, 8 units BID, referred to heart care for outpatient cath DM charcots arthropathy and foot ulcer, seeing Dr [name], last OV 2 weeks ago  Using walker Will be getting diabetic shoe Has been wo insulin for less than a week He was originally doing 30 units BID but was having lows so decreased To 20units BID, since then no lows, cbgs ranging from 89 - 120  Has not seen cards anymore His previous cardiologist has left the area  Has been mostly in [place] taking care of his mother who was recently diagnosed with breast cancer  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 0 0  Number falls in past yr: 0 -  Injury with Fall? 0 -     Allergies   Allergen Reactions  . Ace Inhibitors Itching and Cough  . Naproxen Anaphylaxis, Shortness Of Breath and Other (See Comments)    Throat swells. cannot breathe, and causes GI distress    Prior to Admission medications   Medication Sig Start Date End Date Taking? Authorizing Provider  acetaminophen (TYLENOL) 650 MG CR tablet Take 650 mg by mouth every 8 (eight) hours as needed for pain.    [provider]  aspirin 81 MG chewable tablet Chew 81 mg by mouth daily.    [provider]  atorvastatin (LIPITOR) 20 MG tablet Take 20 mg by mouth daily. 09/27/18   [provider]  blood glucose meter kit and supplies KIT Dispense based on patient and insurance preference. Use up to four times daily as directed. (FOR ICD-9 250.00, 250.01). 10/24/18   Domenic Polite, MD  doxycycline (VIBRAMYCIN) 100 MG capsule Take 1 capsule (100 mg total) by mouth 2 (two) times daily. 02/05/19   Rayburn, Neta Mends, PA-C  furosemide (LASIX) 40 MG tablet Take 40 mg by mouth daily. 09/27/18   [provider]  gabapentin (NEURONTIN) 300 MG capsule Take 1 capsule (300 mg total) by mouth 3 (three) times daily. 07/18/18   [name], MD  Insulin Isophane & Regular Human (HUMULIN 70/30 MIX) (70-30) 100  UNIT/ML PEN Inject 8 Units into the skin 2 (two) times daily before a meal. 10/24/18   Domenic Polite, MD  Lancets (FREESTYLE) lancets Use as instructed 09/18/14   Delfina Redwood, MD  Lancets 30G MISC  04/06/16   [provider]  Multiple Vitamin (MULTIVITAMIN WITH MINERALS) TABS tablet Take 1 tablet by mouth daily.    [provider]  oxyCODONE (OXY IR/ROXICODONE) 5 MG immediate release tablet Take 1 tablet (5 mg total) by mouth every 6 (six) hours as needed for severe pain. 11/22/18   Rayburn, Neta Mends, PA-C  polyethylene glycol (MIRALAX / GLYCOLAX) packet Take 17 g by mouth daily as needed for mild constipation. 10/24/18   Domenic Polite, MD  silver sulfADIAZINE  (SILVADENE) 1 % cream Apply 1 application topically daily. Left foot 01/16/19   Suzan Slick, NP    Past Medical History:  Diagnosis Date  . Diabetic retinopathy ([place])   . Diabetic retinopathy associated with type 2 diabetes mellitus ([place]) 09/13/2014   He works for [employer]  . Fluid overload 09/03/2012   Post op  . GERD (gastroesophageal reflux disease)   . Prostate cancer ([place])   . Visual impairment     Past Surgical History:  Procedure Laterality Date  . CATARACT EXTRACTION W/ INTRAOCULAR LENS  IMPLANT, BILATERAL    . EYE SURGERY Bilateral    "laser OR for diabetic retinopathy"  . INGUINAL HERNIA REPAIR     [name] 07/12/2010), "don't remember which side"  . LAPAROTOMY  08/23/2012   Procedure: EXPLORATORY LAPAROTOMY;  Surgeon: [name], DO;  [facility];  Service: General;  Laterality: N/A;  exploratory laparotomy with lysis of adhesions  . LYSIS OF ADHESION  08/23/2012   Procedure: LYSIS OF ADHESION;  Surgeon: [name], DO;  [facility];  Service: General;;  . ROBOT ASSISTED LAPAROSCOPIC RADICAL PROSTATECTOMY  2000's   "had to finish manually after machine broke"  . SHOULDER SURGERY  1970's   separation; from playing football"    Social History   Tobacco Use  . Smoking status: Former Smoker    Packs/day: 2.50    Years: 3.00    Pack years: 7.50    Types: Cigarettes    Quit date: 11/09/1966    Years since quitting: 52.2  . Smokeless tobacco: Never Used  Substance Use Topics  . Alcohol use: Yes    Comment: 04/08/2015 "maybe a beer/ or 2 or a glass of wine monthly"    Family History  Problem Relation Age of Onset  . Diabetes Mellitus II Mother   . Colon cancer Neg Hx     Review of Systems  Constitutional: Negative for chills and fever.  Respiratory: Negative for cough and shortness of breath.   Cardiovascular: Negative for chest pain, palpitations and leg swelling.  Gastrointestinal: Negative for abdominal pain, nausea and vomiting.      OBJECTIVE:  Today's Vitals   02/07/19 1054  BP: 140/90  Pulse: 65  Temp: 97.8 F (36.6 C)  TempSrc: Oral  SpO2: 98%  Weight: 176 lb (79.8 kg)  Height: '5\' 6"'  (1.676 m)   Body mass index is 28.41 kg/m.   Physical Exam Vitals signs and nursing note reviewed.  Constitutional:      Appearance: He is well-developed.  HENT:     Head: Normocephalic and atraumatic.     Right Ear: Hearing, tympanic membrane, ear canal and external ear normal. There is impacted cerumen.     Left Ear: Hearing,  tympanic membrane, ear canal and external ear normal. There is impacted cerumen.     Mouth/Throat:     Pharynx: No oropharyngeal exudate.  Eyes:     Conjunctiva/sclera: Conjunctivae normal.     Pupils: Pupils are equal, round, and reactive to light.  Neck:     Musculoskeletal: Neck supple.  Cardiovascular:     Rate and Rhythm: Normal rate and regular rhythm.     Heart sounds: No murmur. No friction rub. No gallop.   Pulmonary:     Effort: Pulmonary effort is normal.     Breath sounds: Normal breath sounds. No wheezing or rales.  Musculoskeletal:       Feet:  Lymphadenopathy:     Cervical: No cervical adenopathy.  Skin:    General: Skin is warm and dry.  Neurological:     Mental Status: He is alert and oriented to person, place, and time.     Results for orders placed or performed in visit on 02/07/19 (from the past 24 hour(s))  POCT glycosylated hemoglobin (Hb A1C)     Status: Abnormal   Collection Time: 02/07/19 11:53 AM  Result Value Ref Range   Hemoglobin A1C 12.0 (A) 4.0 - 5.6 %   HbA1c POC (<> result, manual entry)     HbA1c, POC (prediabetic range)     HbA1c, POC (controlled diabetic range)      ASSESSMENT and PLAN  1. Type 2 diabetes mellitus with hyperglycemia, with long-term current use of insulin (HCC) Uncontrolled. Check cbgs BID. Bring logs at next OV, discussed importance of cbg control as patient with multiple complications. Consider endo referral. - Lipid  panel - TSH - Microalbumin / creatinine urine ratio - POCT glycosylated hemoglobin (Hb A1C) - CMP14+EGFR  2. Essential hypertension Controlled. Continue current regime.  - Lipid panel - TSH - CMP14+EGFR  3. HYPERLIPIDEMIA, MIXED Checking labs today, medications will be adjusted as needed.  - Lipid panel - TSH - CMP14+EGFR  4. Stage 3 chronic kidney disease ([place]) Checking labs today. Discussed importance of cbg and BP control.  5. Abnormal nuclear stress test - Ambulatory referral to Cardiology  6. Bilateral impacted cerumen - Ear wax removal  7. Diabetic ulcer of left midfoot associated with diabetes mellitus due to underlying condition, limited to breakdown of skin [facility]) Managed by Dr [name]  Other orders - Insulin Isophane & Regular Human (HUMULIN 70/30 MIX) (70-30) 100 UNIT/ML PEN; Inject 20 Units into the skin 2 (two) times daily before a meal.  Return in about 2 weeks (around 02/21/2019) for followup on sugars, please bring log or glucometer.    [name], MD Primary Care at [place], [place] [address] Ph.  [phone] Fax [phone]

## 2019-02-07 NOTE — Progress Notes (Signed)
9

## 2019-02-07 NOTE — Patient Instructions (Signed)
° ° ° °  If you have lab work done today you will be contacted with your lab results within the next 2 weeks.  If you have not heard from us then please contact us. The fastest way to get your results is to register for My Chart. ° ° °IF you received an x-ray today, you will receive an invoice from [facility]. Please contact [facility] at [phone] with questions or concerns regarding your invoice.  ° °IF you received labwork today, you will receive an invoice from [facility]. Please contact [facility] at [phone] with questions or concerns regarding your invoice.  ° °Our billing staff will not be able to assist you with questions regarding bills from these companies. ° °You will be contacted with the lab results as soon as they are available. The fastest way to get your results is to activate your My Chart account. Instructions are located on the last page of this paperwork. If you have not heard from us regarding the results in 2 weeks, please contact this office. °  ° ° ° °

## 2019-02-08 LAB — CMP14+EGFR
ALT: 28 IU/L (ref 0–44)
AST: 25 IU/L (ref 0–40)
Albumin/Globulin Ratio: 1.3 (ref 1.2–2.2)
Albumin: 3.9 g/dL (ref 3.8–4.8)
Alkaline Phosphatase: 150 IU/L — ABNORMAL HIGH (ref 39–117)
BUN/Creatinine Ratio: 14 (ref 10–24)
BUN: 23 mg/dL (ref 8–27)
Bilirubin Total: 0.7 mg/dL (ref 0.0–1.2)
CO2: 22 mmol/L (ref 20–29)
Calcium: 9.7 mg/dL (ref 8.6–10.2)
Chloride: 102 mmol/L (ref 96–106)
Creatinine, Ser: 1.64 mg/dL — ABNORMAL HIGH (ref 0.76–1.27)
GFR calc Af Amer: 49 mL/min/{1.73_m2} — ABNORMAL LOW (ref >59)
GFR calc non Af Amer: 42 mL/min/{1.73_m2} — ABNORMAL LOW (ref >59)
Globulin, Total: 2.9 g/dL (ref 1.5–4.5)
Glucose: 108 mg/dL — ABNORMAL HIGH (ref 65–99)
Potassium: 5.2 mmol/L (ref 3.5–5.2)
Sodium: 142 mmol/L (ref 134–144)
Total Protein: 6.8 g/dL (ref 6.0–8.5)

## 2019-02-08 LAB — MICROALBUMIN / CREATININE URINE RATIO
Creatinine, Urine: 74.4 mg/dL
Microalb/Creat Ratio: 756 mg/g creat — ABNORMAL HIGH (ref 0–29)
Microalbumin, Urine: 562.7 ug/mL

## 2019-02-08 LAB — TSH: TSH: 0.97 u[IU]/mL (ref 0.450–4.500)

## 2019-02-08 LAB — LIPID PANEL
Chol/HDL Ratio: 2.7 ratio (ref 0.0–5.0)
Cholesterol, Total: 135 mg/dL (ref 100–199)
HDL: 50 mg/dL (ref >39)
LDL Calculated: 63 mg/dL (ref 0–99)
Triglycerides: 110 mg/dL (ref 0–149)
VLDL Cholesterol Cal: 22 mg/dL (ref 5–40)

## 2019-02-11 MED ORDER — LOSARTAN POTASSIUM 25 MG PO TABS: 25.0000 mg | ORAL_TABLET | Freq: Every day | ORAL | 2 refills | Status: DC

## 2019-02-11 NOTE — Addendum Note (Signed)
Addended by: [name] on: 02/11/2019 02:54 PM   Modules accepted: Orders

## 2019-02-12 ENCOUNTER — Other Ambulatory Visit: Payer: Self-pay

## 2019-02-12 ENCOUNTER — Other Ambulatory Visit: Payer: Self-pay | Admitting: Family Medicine

## 2019-02-12 ENCOUNTER — Ambulatory Visit (INDEPENDENT_AMBULATORY_CARE_PROVIDER_SITE_OTHER): Payer: Medicare Other | Admitting: Family

## 2019-02-12 DIAGNOSIS — L97421 Non-pressure chronic ulcer of left heel and midfoot limited to breakdown of skin: Secondary | ICD-10-CM

## 2019-02-12 DIAGNOSIS — E11621 Type 2 diabetes mellitus with foot ulcer: Secondary | ICD-10-CM

## 2019-02-15 ENCOUNTER — Telehealth: Payer: Self-pay | Admitting: Family Medicine

## 2019-02-15 NOTE — Progress Notes (Signed)
02/15/2019 - DR. IRMA REQUESTED PATIENT RETURN TO SEE HER IN ABOUT 4 WEEKS FOR A FOLLOW-UP AFTER HE STARTS LOSARTAN. I TIED TO CALL AND SCHEDULE BUT HAD TO LEAVE HIM A VOICE MAIL TO RETURN OUR CALL. [name]

## 2019-02-15 NOTE — Telephone Encounter (Signed)
02/15/2019 - ON 02/11/2019 DR. IRMA REQUESTED PATIENT RETURN TO SEE HER IN ABOUT 4 WEEKS FOR A FOLLOW-UP VISIT AFTER HE STARTS THE LOSARTAN. I TRIED TO CALL AND SCHEDULE BUT HAD TO LEAVE HIM A MESSAGE ON HIS VOICE MAIL TO RETURN OUR CALL. [place]

## 2019-02-19 ENCOUNTER — Encounter: Payer: Self-pay | Admitting: Family

## 2019-02-19 NOTE — Progress Notes (Signed)
Office Visit Note   Patient: [name]           Date of Birth: 30-Aug-1950           MRN: [id number] Visit Date: 02/12/2019              Requested by: [name], MD [address] [place],  [place] [address] PCP: [name], MD  No chief complaint on file.     HPI: The patient is a 68 year old gentleman seen today in follow up for a left mid foot plantar ulceration. Has diabetic insensate polyneuropathy. Has been wearing a medical compression stocking.  Is offloading with a felt donut pad in a post op shoe.      Assessment & Plan: Visit Diagnoses:  No diagnosis found.  Plan: The left mid foot ulcer was debrided to healthy bleeding tissue and hemostasis was achieved using silver nitrate. He will continue to off load the area and again discussed with the patient the need to off load and elevate the foot as much as possible.  Continue medical compression sock.  Follow up in 2 more weeks for reevaluation.  Follow-Up Instructions: No follow-ups on file.   Ortho Exam  Patient is alert, oriented, no adenopathy, well-dressed, normal affect, normal respiratory effort. The left mid foot ulceration is stable today. There is no exposed tendon or bone. The ulcer was debrided to healthy appearing bleeding tissue and hemostasis with silver nitrate. Does have a palpable pedal pulse . No surrounding erythema, maceration. No odor. Does have tenderness over the base of the 5th metatarsal.   Imaging: No results found. No images are attached to the encounter.  Labs: Lab Results  Component Value Date   HGBA1C 12.0 (A) 02/07/2019   HGBA1C 13.9 (H) 10/19/2018   HGBA1C 11.9 (H) 07/18/2018   ESRSEDRATE 52 (H) 10/19/2018   CRP 9.9 (H) 10/19/2018   LABURIC 5.3 07/11/2010   REPTSTATUS 10/24/2018 FINAL 10/19/2018   CULT  10/19/2018    NO GROWTH 5 DAYS Performed at [facility], [place] [address], [place], [place] [address]      Lab Results  Component Value Date   ALBUMIN 3.9 02/07/2019   ALBUMIN 3.5 10/19/2018   ALBUMIN 4.3 07/18/2018   PREALBUMIN 10.9 (L) 10/19/2018   PREALBUMIN 11.7 (L) 08/30/2012   LABURIC 5.3 07/11/2010    There is no height or weight on file to calculate BMI.  Orders:  No orders of the defined types were placed in this encounter.  No orders of the defined types were placed in this encounter.    Procedures: No procedures performed  Clinical Data: No additional findings.  ROS:  All other systems negative, except as noted in the HPI. Review of Systems  Constitutional: Negative for chills and fever.  Skin: Positive for wound.    Objective: Vital Signs: There were no vitals taken for this visit.  Specialty Comments:  No specialty comments available.  PMFS History: Patient Active Problem List   Diagnosis Date Noted  . Moderate protein-calorie malnutrition ([place])   . Diabetic foot ulcer ([place]) 10/19/2018  . Diabetic foot infection ([place])   . Pseudophakia, both eyes 10/04/2018  . Stage 3 chronic kidney disease ([place]) 09/27/2018  . Adrenal insufficiency ([place]) 04/09/2015  . Peripheral edema 09/17/2014  . DOE (dyspnea on exertion) 09/17/2014  . Bronchospasm, acute 09/16/2014  . Partial small bowel obstruction ([place]) 09/16/2014  . Hyperglycemia   . Diabetic retinopathy associated with type 2  diabetes mellitus ([place]) 09/13/2014  . Abdominal pain 09/13/2014  . Cough 11/15/2012  . Pharyngitis 09/14/2012  . Fluid overload 09/03/2012  . Ileus following gastrointestinal surgery ([place]) 09/03/2012  . Hypokalemia 08/30/2012  . Small bowel obstruction s/p LOA [id number] 08/21/2012  . Abnormal EKG 08/21/2012  . Chest pain 08/21/2012  . Macular degeneration (senile) of retina 12/15/2011  . Lens replaced 12/15/2011  . Vitreous hemorrhage ([place]) 12/15/2011  . Essential hypertension 08/19/2008  . Regional enteritis of small intestine ([place]) 01/03/2008  . ERECTILE DYSFUNCTION, ORGANIC 01/03/2008  . Cellulitis and abscess of  face 10/11/2007  . Sarcoidosis 07/19/2007  . Diabetes mellitus ([place]) 07/19/2007  . HYPERLIPIDEMIA, MIXED 07/19/2007  . CERUMEN IMPACTION, BILATERAL 07/19/2007  . URINARY INCONTINENCE, STRESS, MALE 08/17/2006  . PROSTATE CANCER, HX OF 08/17/2006  . DIABETIC  RETINOPATHY 01/09/2002   Past Medical History:  Diagnosis Date  . Diabetic retinopathy ([place])   . Diabetic retinopathy associated with type 2 diabetes mellitus ([place]) 09/13/2014   He works for [employer]  . Fluid overload 09/03/2012   Post op  . GERD (gastroesophageal reflux disease)   . Prostate cancer ([place]   . Visual impairment     Family History  Problem Relation Age of Onset  . Diabetes Mellitus II Mother   . Colon cancer Neg Hx     Past Surgical History:  Procedure Laterality Date  . CATARACT EXTRACTION W/ INTRAOCULAR LENS  IMPLANT, BILATERAL    . EYE SURGERY Bilateral    "laser OR for diabetic retinopathy"  . INGUINAL HERNIA REPAIR     [name] 07/12/2010), "don't remember which side"  . LAPAROTOMY  08/23/2012   Procedure: EXPLORATORY LAPAROTOMY;  Surgeon: [name], DO;  [facility];  Service: General;  Laterality: N/A;  exploratory laparotomy with lysis of adhesions  . LYSIS OF ADHESION  08/23/2012   Procedure: LYSIS OF ADHESION;  Surgeon: [name], DO;  [facility];  Service: General;;  . ROBOT ASSISTED LAPAROSCOPIC RADICAL PROSTATECTOMY  2000's   "had to finish manually after machine broke"  . SHOULDER SURGERY  1970's   separation; from playing football"   Social History   Occupational History  . Not on file  Tobacco Use  . Smoking status: Former Smoker    Packs/day: 2.50    Years: 3.00    Pack years: 7.50    Types: Cigarettes    Quit date: 11/09/1966    Years since quitting: 52.3  . Smokeless tobacco: Never Used  Substance and Sexual Activity  . Alcohol use: Yes    Comment: 04/08/2015 "maybe a beer/ or 2 or a glass of wine monthly"  . Drug use: No  . Sexual activity: Yes

## 2019-02-21 ENCOUNTER — Ambulatory Visit: Payer: Medicare Other | Admitting: Family Medicine

## 2019-02-26 ENCOUNTER — Ambulatory Visit (INDEPENDENT_AMBULATORY_CARE_PROVIDER_SITE_OTHER): Payer: Medicare Other | Admitting: Family

## 2019-02-26 ENCOUNTER — Encounter: Payer: Self-pay | Admitting: Family

## 2019-02-26 ENCOUNTER — Ambulatory Visit: Payer: Self-pay

## 2019-02-26 ENCOUNTER — Other Ambulatory Visit: Payer: Self-pay

## 2019-02-26 DIAGNOSIS — L97421 Non-pressure chronic ulcer of left heel and midfoot limited to breakdown of skin: Secondary | ICD-10-CM | POA: Diagnosis not present

## 2019-02-26 DIAGNOSIS — E11621 Type 2 diabetes mellitus with foot ulcer: Secondary | ICD-10-CM | POA: Diagnosis not present

## 2019-02-26 MED ORDER — DOXYCYCLINE HYCLATE 100 MG PO CAPS: 100.0000 mg | ORAL_CAPSULE | Freq: Two times a day (BID) | ORAL | 1 refills | Status: DC

## 2019-02-27 ENCOUNTER — Encounter: Payer: Self-pay | Admitting: Family

## 2019-02-28 ENCOUNTER — Telehealth: Payer: Self-pay | Admitting: Family

## 2019-02-28 NOTE — Telephone Encounter (Signed)
Patient called asked if he can get a note for his employer stating he will be out of work until he is reevaluated at his next appointment. The number to contact patient is [phone]

## 2019-03-01 ENCOUNTER — Ambulatory Visit: Payer: Medicare Other | Admitting: Family Medicine

## 2019-03-01 NOTE — Telephone Encounter (Signed)
Patient was called and informed that his [facility] note will be at front desk upon arrival.

## 2019-03-04 ENCOUNTER — Telehealth: Payer: Self-pay | Admitting: Orthopedic Surgery

## 2019-03-04 ENCOUNTER — Other Ambulatory Visit: Payer: Self-pay

## 2019-03-04 DIAGNOSIS — L97421 Non-pressure chronic ulcer of left heel and midfoot limited to breakdown of skin: Secondary | ICD-10-CM

## 2019-03-04 DIAGNOSIS — E11621 Type 2 diabetes mellitus with foot ulcer: Secondary | ICD-10-CM

## 2019-03-04 MED ORDER — SILVER SULFADIAZINE 1 % EX CREA: 1.0000 "application " | TOPICAL_CREAM | Freq: Every day | CUTANEOUS | 0 refills | Status: DC

## 2019-03-04 NOTE — Telephone Encounter (Signed)
Patient called to get refill of silver sulfadiazine 1% cream  Please call patient to [phone]

## 2019-03-04 NOTE — Telephone Encounter (Signed)
Rx was refilled and sent to pharmacy  

## 2019-03-05 NOTE — Progress Notes (Signed)
Office Visit Note   Patient: [name]           Date of Birth: [birth year]           MRN: [id number] Visit Date: 02/26/2019              Requested by: [name], MD [address] [place],  [place] [address] PCP: [name], MD  Chief Complaint  Patient presents with  . Right Foot - Wound Check  . Left Foot - Wound Check      HPI: The patient is a 68 year old gentleman seen today in follow up for a left mid foot plantar ulceration. Has diabetic insensate polyneuropathy. Has been wearing a medical compression stocking.  Is offloading with a felt donut pad in a post op shoe.  Complains that the donut has been shifting in his shoe.  However he does feel his wound has been improving.  Complaining of some loose skin to the plantar aspect of his foot.     Assessment & Plan: Visit Diagnoses:  1. Diabetic ulcer of left midfoot associated with type 2 diabetes mellitus, limited to breakdown of skin ([place])     Plan: The midfoot ulcer to the plantar aspect of his foot was debrided back to healthy bleeding tissue.  Hemostasis achieved using silver nitrate stick.  He will continue to offload the area and have placed him in a cam walker today with new felt pressure relieving padding.  Encouraged him to non-weight-bear as much as possible.   No definitive osteomyelitis on radiographs performed today.  Will discuss these with Dr. [name].  Follow up in 2 more weeks for reevaluation.  Follow-Up Instructions: Return in about 2 weeks (around 03/12/2019).   Ortho Exam  Patient is alert, oriented, no adenopathy, well-dressed, normal affect, normal respiratory effort. The left mid foot ulceration is worse today.  There is extension of a blister this was debrided to healthy appearing bleeding tissue and hemostasis with silver nitrate.  Remaining ulcer is now 6 cm in diameter there is 3 mm of depth.  There is no purulence serosanguineous drainage.  There is no foul odor no exposed bone  or tendon.  Does have a palpable pedal pulse . No surrounding erythema, maceration. No odor.  Imaging: No results found. No images are attached to the encounter.  Labs: Lab Results  Component Value Date   HGBA1C 12.0 (A) 02/07/2019   HGBA1C 13.9 (H) 10/19/2018   HGBA1C 11.9 (H) 07/18/2018   ESRSEDRATE 52 (H) 10/19/2018   CRP 9.9 (H) 10/19/2018   LABURIC 5.3 07/11/2010   REPTSTATUS 10/24/2018 FINAL 10/19/2018   CULT  10/19/2018    NO GROWTH 5 DAYS Performed at [facility], [place] [address] [place], [place] [address]      Lab Results  Component Value Date   ALBUMIN 3.9 02/07/2019   ALBUMIN 3.5 10/19/2018   ALBUMIN 4.3 07/18/2018   PREALBUMIN 10.9 (L) 10/19/2018   PREALBUMIN 11.7 (L) 08/30/2012   LABURIC 5.3 07/11/2010    There is no height or weight on file to calculate BMI.  Orders:  Orders Placed This Encounter  Procedures  . XR Foot Complete Left  . Ambulatory referral to Vascular Surgery   Meds ordered this encounter  Medications  . doxycycline (VIBRAMYCIN) 100 MG capsule    Sig: Take 1 capsule (100 mg total) by mouth 2 (two) times daily.    Dispense:  60 capsule    Refill:  1     Procedures: No procedures performed  Clinical Data: No additional findings.  ROS:  All other systems negative, except as noted in the HPI. Review of Systems  Constitutional: Negative for chills and fever.  Skin: Positive for wound.    Objective: Vital Signs: There were no vitals taken for this visit.  Specialty Comments:  No specialty comments available.  PMFS History: Patient Active Problem List   Diagnosis Date Noted  . Moderate protein-calorie malnutrition ([place])   . Diabetic foot ulcer ([place]) 10/19/2018  . Diabetic foot infection ([place])   . Pseudophakia, both eyes 10/04/2018  . Stage 3 chronic kidney disease ([place]) 09/27/2018  . Adrenal insufficiency ([place]) 04/09/2015  . Peripheral edema 09/17/2014  . DOE (dyspnea on exertion) 09/17/2014  . Bronchospasm,  acute 09/16/2014  . Partial small bowel obstruction ([place]) 09/16/2014  . Hyperglycemia   . Diabetic retinopathy associated with type 2 diabetes mellitus ([place]) 09/13/2014  . Abdominal pain 09/13/2014  . Cough 11/15/2012  . Pharyngitis 09/14/2012  . Fluid overload 09/03/2012  . Ileus following gastrointestinal surgery ([place]) 09/03/2012  . Hypokalemia 08/30/2012  . Small bowel obstruction s/p LOA [id number] 08/21/2012  . Abnormal EKG 08/21/2012  . Chest pain 08/21/2012  . Macular degeneration (senile) of retina 12/15/2011  . Lens replaced 12/15/2011  . Vitreous hemorrhage ([place]) 12/15/2011  . Essential hypertension 08/19/2008  . Regional enteritis of small intestine ([place]) 01/03/2008  . ERECTILE DYSFUNCTION, ORGANIC 01/03/2008  . Cellulitis and abscess of face 10/11/2007  . Sarcoidosis 07/19/2007  . Diabetes mellitus ([place]) 07/19/2007  . HYPERLIPIDEMIA, MIXED 07/19/2007  . CERUMEN IMPACTION, BILATERAL 07/19/2007  . URINARY INCONTINENCE, STRESS, MALE 08/17/2006  . PROSTATE CANCER, HX OF 08/17/2006  . DIABETIC  RETINOPATHY 01/09/2002   Past Medical History:  Diagnosis Date  . Diabetic retinopathy ([place])   . Diabetic retinopathy associated with type 2 diabetes mellitus ([place]) 09/13/2014   He works for [employer]  . Fluid overload 09/03/2012   Post op  . GERD (gastroesophageal reflux disease)   . Prostate cancer ([place])   . Visual impairment     Family History  Problem Relation Age of Onset  . Diabetes Mellitus II Mother   . Colon cancer Neg Hx     Past Surgical History:  Procedure Laterality Date  . CATARACT EXTRACTION W/ INTRAOCULAR LENS  IMPLANT, BILATERAL    . EYE SURGERY Bilateral    "laser OR for diabetic retinopathy"  . INGUINAL HERNIA REPAIR     [name] 07/12/2010), "don't remember which side"  . LAPAROTOMY  08/23/2012   Procedure: EXPLORATORY LAPAROTOMY;  Surgeon: [name], DO;  [facility];  Service: General;  Laterality: N/A;  exploratory laparotomy with  lysis of adhesions  . LYSIS OF ADHESION  08/23/2012   Procedure: LYSIS OF ADHESION;  Surgeon: [name], DO;  [facility];  Service: General;;  . ROBOT ASSISTED LAPAROSCOPIC RADICAL PROSTATECTOMY  2000's   "had to finish manually after machine broke"  . SHOULDER SURGERY  1970's   separation; from playing football"   Social History   Occupational History  . Not on file  Tobacco Use  . Smoking status: Former Smoker    Packs/day: 2.50    Years: 3.00    Pack years: 7.50    Types: Cigarettes    Quit date: 11/09/1966    Years since quitting: 52.3  . Smokeless tobacco: Never Used  Substance and Sexual Activity  . Alcohol use: Yes    Comment: 04/08/2015 "  maybe a beer/ or 2 or a glass of wine monthly"  . Drug use: No  . Sexual activity: Yes

## 2019-03-12 ENCOUNTER — Ambulatory Visit (INDEPENDENT_AMBULATORY_CARE_PROVIDER_SITE_OTHER): Payer: Medicare Other | Admitting: Family

## 2019-03-12 ENCOUNTER — Encounter: Payer: Self-pay | Admitting: Family

## 2019-03-12 ENCOUNTER — Telehealth: Payer: Self-pay | Admitting: Family

## 2019-03-12 ENCOUNTER — Other Ambulatory Visit: Payer: Self-pay

## 2019-03-12 VITALS — Ht 66.0 in | Wt 176.0 lb

## 2019-03-12 DIAGNOSIS — E11610 Type 2 diabetes mellitus with diabetic neuropathic arthropathy: Secondary | ICD-10-CM

## 2019-03-12 DIAGNOSIS — L97421 Non-pressure chronic ulcer of left heel and midfoot limited to breakdown of skin: Secondary | ICD-10-CM | POA: Diagnosis not present

## 2019-03-12 DIAGNOSIS — E11621 Type 2 diabetes mellitus with foot ulcer: Secondary | ICD-10-CM | POA: Diagnosis not present

## 2019-03-12 NOTE — Progress Notes (Signed)
Office Visit Note   Patient: [name]           Date of Birth: 1950-09-10           MRN: [id number] Visit Date: 03/12/2019              Requested by: [name], MD [address] [place],  [place] [address] PCP: [name], MD  Chief Complaint  Patient presents with  . Left Foot - Follow-up      HPI: The patient is a 68 year old gentleman seen today in follow-up for plantar ulceration to his left midfoot.  He has been in a medical compression stocking with a cam walker.  States he has been nonweightbearing  Appears he is also been doing Silvadene dressing changes twice daily.   He has continued out of work.    Assessment & Plan: Visit Diagnoses:  No diagnosis found.  Plan: Pleased with improvement in ulceration he will continue to offload foot minimize his weightbearing continue out of work.  Continue to cleanse wound daily apply the medical compression stocking with direct skin contact around the clock except for with hygiene.    Discussed that he could return for work procedure light duty. unfortunately his job requires him to be on his feet. Follow-Up Instructions: No follow-ups on file.   Ortho Exam  Patient is alert, oriented, no adenopathy, well-dressed, normal affect, normal respiratory effort.  On evaluation of the left lower extremity there is trace edema to the lower extremity the ulceration to his left midfoot is much improved.  There is surrounding callus this was debrided with a 10 blade knife back to viable tissue open ulceration just 2 cm in diameter 3 mm deep.  The callused area is 8 mm in diameter  There is no purulence serosanguineous drainage.  There is no foul odor no exposed bone or tendon.  Does have a palpable pedal pulse . No surrounding erythema, maceration. No odor.  Imaging: No results found. No images are attached to the encounter.  Labs: Lab Results  Component Value Date   HGBA1C 12.0 (A) 02/07/2019   HGBA1C 13.9 (H)  10/19/2018   HGBA1C 11.9 (H) 07/18/2018   ESRSEDRATE 52 (H) 10/19/2018   CRP 9.9 (H) 10/19/2018   LABURIC 5.3 07/11/2010   REPTSTATUS 10/24/2018 FINAL 10/19/2018   CULT  10/19/2018    NO GROWTH 5 DAYS Performed at [facility], [place] [address], [place], [place] [address]      Lab Results  Component Value Date   ALBUMIN 3.9 02/07/2019   ALBUMIN 3.5 10/19/2018   ALBUMIN 4.3 07/18/2018   PREALBUMIN 10.9 (L) 10/19/2018   PREALBUMIN 11.7 (L) 08/30/2012   LABURIC 5.3 07/11/2010    Body mass index is 28.41 kg/m.  Orders:  No orders of the defined types were placed in this encounter.  No orders of the defined types were placed in this encounter.    Procedures: No procedures performed  Clinical Data: No additional findings.  ROS:  All other systems negative, except as noted in the HPI. Review of Systems  Constitutional: Negative for chills and fever.  Skin: Positive for wound.    Objective: Vital Signs: Ht 5\' 6"  (1.676 m)   Wt 176 lb (79.8 kg)   BMI 28.41 kg/m   Specialty Comments:  No specialty comments available.  PMFS History: Patient Active Problem List   Diagnosis Date Noted  . Moderate protein-calorie malnutrition ([place])   . Diabetic foot ulcer (  [place]) 10/19/2018  . Diabetic foot infection ([place])   . Pseudophakia, both eyes 10/04/2018  . Stage 3 chronic kidney disease ([place]) 09/27/2018  . Adrenal insufficiency ([place]) 04/09/2015  . Peripheral edema 09/17/2014  . DOE (dyspnea on exertion) 09/17/2014  . Bronchospasm, acute 09/16/2014  . Partial small bowel obstruction ([place]) 09/16/2014  . Hyperglycemia   . Diabetic retinopathy associated with type 2 diabetes mellitus ([place]) 09/13/2014  . Abdominal pain 09/13/2014  . Cough 11/15/2012  . Pharyngitis 09/14/2012  . Fluid overload 09/03/2012  . Ileus following gastrointestinal surgery ([place]) 09/03/2012  . Hypokalemia 08/30/2012  . Small bowel obstruction s/p LOA [id number] 08/21/2012  . Abnormal EKG  08/21/2012  . Chest pain 08/21/2012  . Macular degeneration (senile) of retina 12/15/2011  . Lens replaced 12/15/2011  . Vitreous hemorrhage ([place]) 12/15/2011  . Essential hypertension 08/19/2008  . Regional enteritis of small intestine ([place]) 01/03/2008  . ERECTILE DYSFUNCTION, ORGANIC 01/03/2008  . Cellulitis and abscess of face 10/11/2007  . Sarcoidosis 07/19/2007  . Diabetes mellitus ([place]) 07/19/2007  . HYPERLIPIDEMIA, MIXED 07/19/2007  . CERUMEN IMPACTION, BILATERAL 07/19/2007  . URINARY INCONTINENCE, STRESS, MALE 08/17/2006  . PROSTATE CANCER, HX OF 08/17/2006  . DIABETIC  RETINOPATHY 01/09/2002   Past Medical History:  Diagnosis Date  . Diabetic retinopathy ([place])   . Diabetic retinopathy associated with type 2 diabetes mellitus ([place]) 09/13/2014   He works for [employer]  . Fluid overload 09/03/2012   Post op  . GERD (gastroesophageal reflux disease)   . Prostate cancer ([place])   . Visual impairment     Family History  Problem Relation Age of Onset  . Diabetes Mellitus II Mother   . Colon cancer Neg Hx     Past Surgical History:  Procedure Laterality Date  . CATARACT EXTRACTION W/ INTRAOCULAR LENS  IMPLANT, BILATERAL    . EYE SURGERY Bilateral    "laser OR for diabetic retinopathy"  . INGUINAL HERNIA REPAIR     [name] 07/12/2010), "don't remember which side"  . LAPAROTOMY  08/23/2012   Procedure: EXPLORATORY LAPAROTOMY;  Surgeon: [name], DO;  Location: [facility];  Service: General;  Laterality: N/A;  exploratory laparotomy with lysis of adhesions  . LYSIS OF ADHESION  08/23/2012   Procedure: LYSIS OF ADHESION;  Surgeon: [name], DO;  Location: [facility];  Service: General;;  . ROBOT ASSISTED LAPAROSCOPIC RADICAL PROSTATECTOMY  2000's   "had to finish manually after machine broke"  . SHOULDER SURGERY  1970's   separation; from playing football"   Social History   Occupational History  . Not on file  Tobacco Use  . Smoking status: Former Smoker     Packs/day: 2.50    Years: 3.00    Pack years: 7.50    Types: Cigarettes    Quit date: 11/09/1966    Years since quitting: 52.3  . Smokeless tobacco: Never Used  Substance and Sexual Activity  . Alcohol use: Yes    Comment: 04/08/2015 "maybe a beer/ or 2 or a glass of wine monthly"  . Drug use: No  . Sexual activity: Yes

## 2019-03-12 NOTE — Telephone Encounter (Signed)
I called and sw pt to advise that letter is ready and that he can pick it up at any time.

## 2019-03-12 NOTE — Telephone Encounter (Signed)
Patient called requesting a out of work note that states he will be out of work until his next doctor's appointment on 03/26/2019.  He will come pick the note up when it is ready.  CB#[phone].  Thank you.

## 2019-03-26 ENCOUNTER — Ambulatory Visit (INDEPENDENT_AMBULATORY_CARE_PROVIDER_SITE_OTHER): Payer: Medicare Other | Admitting: Family

## 2019-03-26 ENCOUNTER — Encounter: Payer: Self-pay | Admitting: Family

## 2019-03-26 VITALS — Ht 66.0 in | Wt 176.0 lb

## 2019-03-26 DIAGNOSIS — E08621 Diabetes mellitus due to underlying condition with foot ulcer: Secondary | ICD-10-CM

## 2019-03-26 DIAGNOSIS — L97421 Non-pressure chronic ulcer of left heel and midfoot limited to breakdown of skin: Secondary | ICD-10-CM | POA: Diagnosis not present

## 2019-03-26 NOTE — Progress Notes (Signed)
Office Visit Note   Patient: [name]           Date of Birth: [birth year]           MRN: [id number] Visit Date: 03/26/2019              Requested by: [name], MD [address] [place],  [place] [address] PCP: [name], MD  Chief Complaint  Patient presents with  . Left Foot - Follow-up    Foot ulceration       HPI: The patient is a 68 year old gentleman seen today in follow-up for plantar ulceration of the left foot.  He has been in a medical compression stocking with a cam walker.    States he has been doing Silvadene dressing changes.  Has been minimizing his weightbearing  He has continued out of work.  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   Assessment & Plan: Visit Diagnoses:  1. Diabetic ulcer of left midfoot associated with diabetes mellitus due to underlying condition, limited to breakdown of skin ([place])     Plan: Continue doing daily wound cleansing.  Apply the compression stocking with direct skin contact.  Discussed that shoewear is not as important as nonweightbearing.  Any pressure on the wound will slow healing.  Discussed that he may return to work if absolutely necessary for seated work.   Follow-Up Instructions: Return in about 16 days (around 04/11/2019).   Ortho Exam  Patient is alert, oriented, no adenopathy, well-dressed, normal affect, normal respiratory effort.  On examination left lower extremity there is trace edema to the left lower extremity there is an ulcer to the plantar aspect of his foot this is about 2 cm x 3 cm.  There is 3 mm of depth.  There is some surrounding callus as well.  Following informed consent nonviable tissue was trimmed with a 10 blade knife back to viable tissue.  There is no active drainage no odor no exposed bone or tendon this does not probe.  There is no sign of infection.    Imaging: No results found. No images are attached to the  encounter.  Labs: Lab Results  Component Value Date   HGBA1C 12.0 (A) 02/07/2019   HGBA1C 13.9 (H) 10/19/2018   HGBA1C 11.9 (H) 07/18/2018   ESRSEDRATE 52 (H) 10/19/2018   CRP 9.9 (H) 10/19/2018   LABURIC 5.3 07/11/2010   REPTSTATUS 10/24/2018 FINAL 10/19/2018   CULT  10/19/2018    NO GROWTH 5 DAYS Performed at [facility], [place] [address] [place],  [address]      Lab Results  Component Value Date   ALBUMIN 3.9 02/07/2019   ALBUMIN 3.5 10/19/2018   ALBUMIN 4.3 07/18/2018   PREALBUMIN 10.9 (L) 10/19/2018   PREALBUMIN 11.7 (L) 08/30/2012   LABURIC 5.3 07/11/2010    Body mass index is 28.41 kg/m.  Orders:  No orders of the defined types were placed in this encounter.  No orders of the defined types were placed in this encounter.    Procedures: No procedures performed  Clinical Data: No additional findings.  ROS:  All other systems negative, except as noted in the HPI. Review of Systems  Constitutional: Negative for chills and fever.  Skin: Positive for wound.    Objective: Vital Signs: Ht 5\' 6"  (1.676 m)   Wt 176 lb (79.8 kg)   BMI 28.41 kg/m  Specialty Comments:  No specialty comments available.  PMFS History: Patient Active Problem List   Diagnosis Date Noted  . Moderate protein-calorie malnutrition ([place])   . Diabetic foot ulcer ([place]) 10/19/2018  . Diabetic foot infection ([place])   . Pseudophakia, both eyes 10/04/2018  . Stage 3 chronic kidney disease ([place]) 09/27/2018  . Adrenal insufficiency ([place]) 04/09/2015  . Peripheral edema 09/17/2014  . DOE (dyspnea on exertion) 09/17/2014  . Bronchospasm, acute 09/16/2014  . Partial small bowel obstruction ([place]) 09/16/2014  . Hyperglycemia   . Diabetic retinopathy associated with type 2 diabetes mellitus ([place]) 09/13/2014  . Abdominal pain 09/13/2014  . Cough 11/15/2012  . Pharyngitis 09/14/2012  . Fluid overload 09/03/2012  . Ileus following gastrointestinal surgery ([place]) 09/03/2012   . Hypokalemia 08/30/2012  . Small bowel obstruction s/p LOA [id number] 08/21/2012  . Abnormal EKG 08/21/2012  . Chest pain 08/21/2012  . Macular degeneration (senile) of retina 12/15/2011  . Lens replaced 12/15/2011  . Vitreous hemorrhage ([place]) 12/15/2011  . Essential hypertension 08/19/2008  . Regional enteritis of small intestine ([place]) 01/03/2008  . ERECTILE DYSFUNCTION, ORGANIC 01/03/2008  . Cellulitis and abscess of face 10/11/2007  . Sarcoidosis 07/19/2007  . Diabetes mellitus ([place]) 07/19/2007  . HYPERLIPIDEMIA, MIXED 07/19/2007  . CERUMEN IMPACTION, BILATERAL 07/19/2007  . URINARY INCONTINENCE, STRESS, MALE 08/17/2006  . PROSTATE CANCER, HX OF 08/17/2006  . DIABETIC  RETINOPATHY 01/09/2002   Past Medical History:  Diagnosis Date  . Diabetic retinopathy ([place])   . Diabetic retinopathy associated with type 2 diabetes mellitus ([place]) 09/13/2014   He works for [employer]  . Fluid overload 09/03/2012   Post op  . GERD (gastroesophageal reflux disease)   . Prostate cancer ([place])   . Visual impairment     Family History  Problem Relation Age of Onset  . Diabetes Mellitus II Mother   . Colon cancer Neg Hx     Past Surgical History:  Procedure Laterality Date  . CATARACT EXTRACTION W/ INTRAOCULAR LENS  IMPLANT, BILATERAL    . EYE SURGERY Bilateral    "laser OR for diabetic retinopathy"  . INGUINAL HERNIA REPAIR     [name] 07/12/2010), "don't remember which side"  . LAPAROTOMY  08/23/2012   Procedure: EXPLORATORY LAPAROTOMY;  Surgeon: [name], DO;  [facility];  Service: General;  Laterality: N/A;  exploratory laparotomy with lysis of adhesions  . LYSIS OF ADHESION  08/23/2012   Procedure: LYSIS OF ADHESION;  Surgeon: [name], DO;  Location[facility];  Service: General;;  . ROBOT ASSISTED LAPAROSCOPIC RADICAL PROSTATECTOMY  2000's   "had to finish manually after machine broke"  . SHOULDER SURGERY  1970's   separation; from playing football"   Social  History   Occupational History  . Not on file  Tobacco Use  . Smoking status: Former Smoker    Packs/day: 2.50    Years: 3.00    Pack years: 7.50    Types: Cigarettes    Quit date: 11/09/1966    Years since quitting: 52.4  . Smokeless tobacco: Never Used  Substance and Sexual Activity  . Alcohol use: Yes    Comment: 04/08/2015 "maybe a beer/ or 2 or a glass of wine monthly"  . Drug use: No  . Sexual activity: Yes

## 2019-04-02 ENCOUNTER — Telehealth: Payer: Self-pay | Admitting: Orthopedic Surgery

## 2019-04-02 ENCOUNTER — Ambulatory Visit: Payer: Medicare Other | Admitting: Family Medicine

## 2019-04-02 NOTE — Telephone Encounter (Signed)
Disregard-pt disconnected call

## 2019-04-02 NOTE — Telephone Encounter (Signed)
Okay, noted

## 2019-04-02 NOTE — Telephone Encounter (Signed)
Pt called in requesting a call back from [facility] in regards to getting a letter for court.   [phone]

## 2019-04-03 ENCOUNTER — Other Ambulatory Visit: Payer: Self-pay

## 2019-04-03 ENCOUNTER — Ambulatory Visit (INDEPENDENT_AMBULATORY_CARE_PROVIDER_SITE_OTHER): Payer: Medicare Other | Admitting: Family

## 2019-04-03 ENCOUNTER — Encounter: Payer: Self-pay | Admitting: Family

## 2019-04-03 VITALS — Ht 66.0 in | Wt 176.0 lb

## 2019-04-03 DIAGNOSIS — E08621 Diabetes mellitus due to underlying condition with foot ulcer: Secondary | ICD-10-CM | POA: Diagnosis not present

## 2019-04-03 DIAGNOSIS — L97421 Non-pressure chronic ulcer of left heel and midfoot limited to breakdown of skin: Secondary | ICD-10-CM | POA: Diagnosis not present

## 2019-04-03 NOTE — Progress Notes (Signed)
Office Visit Note   Patient: [name]           Date of Birth: 1951-02-01           MRN: [id number] Visit Date: 04/03/2019              Requested by: [name], MD [address] [place],  [place] [address] PCP: [name], MD  Chief Complaint  Patient presents with  . Left Foot - Follow-up      HPI: The patient is a 68 year old gentleman seen today in follow-up for ulceration to his left foot.  He has had a longstanding history of a diabetic foot ulcer on the left.  He had previously been using a medical compression stocking in a cam walker.  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    Also has a court date containing to his housing situation later today.       States he has been doing Silvadene dressing changes.  Has been minimizing his weightbearing    Assessment & Plan: Visit Diagnoses:  1. Diabetic ulcer of left midfoot associated with diabetes mellitus due to underlying condition, limited to breakdown of skin ([place])     Plan: He will continue with the daily Dial soap cleansing of his left foot wound.  Apply his compression garment with direct skin contact.  Discussed nonweightbearing is the best thing he can do for himself.  Elevate around-the-clock.  Out of work.      Follow-Up Instructions: Return in about 3 weeks (around 04/24/2019).   Ortho Exam  Patient is alert, oriented, no adenopathy, well-dressed, normal affect, normal respiratory effort.  On examination of the left lower extremity there is 1+ pitting edema to the left lower extremity no erythema or cellulitis to the leg.  The plantar aspect of his midfoot there is a 55 x 35 mm ulcer this is 4 mm deep surrounding nonviable tissue was debrided with a 10 blade knife back to viable tissue.   Hemostasis with silver nitrate stick.  There is no exposed bone or tendon.  No odor purulence or erythema.    Imaging: No results found. No images are attached to the encounter.  Labs: Lab Results  Component Value Date   HGBA1C 12.0 (A) 02/07/2019   HGBA1C 13.9 (H) 10/19/2018   HGBA1C 11.9 (H) 07/18/2018   ESRSEDRATE 52 (H) 10/19/2018   CRP 9.9 (H) 10/19/2018   LABURIC 5.3 07/11/2010   REPTSTATUS 10/24/2018 FINAL 10/19/2018   CULT  10/19/2018    NO GROWTH 5 DAYS Performed at [facility], [place] [address], [place], [place] [address]      Lab Results  Component Value Date   ALBUMIN 3.9 02/07/2019   ALBUMIN 3.5 10/19/2018   ALBUMIN 4.3 07/18/2018   PREALBUMIN 10.9 (L) 10/19/2018   PREALBUMIN 11.7 (L) 08/30/2012   LABURIC 5.3 07/11/2010    Body mass index is 28.41 kg/m.  Orders:  No orders of the defined types were placed in this encounter.  No orders of the defined types were placed in this encounter.    Procedures: No procedures performed  Clinical Data: No additional findings.  ROS:  All other systems negative, except as noted in the HPI. Review of Systems  Constitutional: Negative for  chills and fever.  Skin: Positive for wound.    Objective: Vital Signs: Ht 5\' 6"  (1.676 m)   Wt 176 lb (79.8 kg)   BMI 28.41 kg/m   Specialty Comments:  No specialty comments available.  PMFS History: Patient Active Problem List   Diagnosis Date Noted  . Moderate protein-calorie malnutrition ([place])   . Diabetic foot ulcer ([place]) 10/19/2018  . Diabetic foot infection ([place])   . Pseudophakia, both eyes 10/04/2018  . Stage 3 chronic kidney disease ([place]) 09/27/2018  . Adrenal insufficiency ([place]) 04/09/2015  . Peripheral edema 09/17/2014  . DOE (dyspnea on exertion) 09/17/2014  . Bronchospasm, acute 09/16/2014  . Partial small bowel obstruction ([place]) 09/16/2014  . Hyperglycemia   . Diabetic retinopathy associated with type 2 diabetes mellitus ([place]) 09/13/2014   . Abdominal pain 09/13/2014  . Cough 11/15/2012  . Pharyngitis 09/14/2012  . Fluid overload 09/03/2012  . Ileus following gastrointestinal surgery ([place]) 09/03/2012  . Hypokalemia 08/30/2012  . Small bowel obstruction s/p LOA [id number] 08/21/2012  . Abnormal EKG 08/21/2012  . Chest pain 08/21/2012  . Macular degeneration (senile) of retina 12/15/2011  . Lens replaced 12/15/2011  . Vitreous hemorrhage ([place]) 12/15/2011  . Essential hypertension 08/19/2008  . Regional enteritis of small intestine ([place]) 01/03/2008  . ERECTILE DYSFUNCTION, ORGANIC 01/03/2008  . Sarcoidosis 07/19/2007  . Diabetes mellitus ([place]) 07/19/2007  . HYPERLIPIDEMIA, MIXED 07/19/2007  . CERUMEN IMPACTION, BILATERAL 07/19/2007  . URINARY INCONTINENCE, STRESS, MALE 08/17/2006  . PROSTATE CANCER, HX OF 08/17/2006  . DIABETIC  RETINOPATHY 01/09/2002   Past Medical History:  Diagnosis Date  . Cellulitis and abscess of face 10/11/2007   Qualifier: Diagnosis of  By: Amil Amen MD, Benjamine Mola    . Diabetic retinopathy ([place])   . Diabetic retinopathy associated with type 2 diabetes mellitus ([place]) 09/13/2014   He works for [employer]  . Fluid overload 09/03/2012   Post op  . GERD (gastroesophageal reflux disease)   . Prostate cancer ([place])   . Visual impairment     Family History  Problem Relation Age of Onset  . Diabetes Mellitus II Mother   . Colon cancer Neg Hx     Past Surgical History:  Procedure Laterality Date  . CATARACT EXTRACTION W/ INTRAOCULAR LENS  IMPLANT, BILATERAL    . EYE SURGERY Bilateral    "laser OR for diabetic retinopathy"  . INGUINAL HERNIA REPAIR     [name] 07/12/2010), "don't remember which side"  . LAPAROTOMY  08/23/2012   Procedure: EXPLORATORY LAPAROTOMY;  Surgeon: [name], DO;  Location: [facility];  Service: General;  Laterality: N/A;  exploratory laparotomy with lysis of adhesions  . LYSIS OF ADHESION  08/23/2012   Procedure: LYSIS OF ADHESION;  Surgeon: [name], DO;   Location: [facility];  Service: General;;  . ROBOT ASSISTED LAPAROSCOPIC RADICAL PROSTATECTOMY  2000's   "had to finish manually after machine broke"  . SHOULDER SURGERY  1970's   separation; from playing football"   Social History   Occupational History  . Not on file  Tobacco Use  . Smoking status: Former Smoker    Packs/day: 2.50    Years: 3.00    Pack years: 7.50    Types: Cigarettes    Quit date: 11/09/1966    Years since quitting: 52.4  . Smokeless tobacco: Never Used  Substance and Sexual Activity  . Alcohol use: Yes    Comment: 04/08/2015 "maybe a beer/ or 2 or a glass of wine monthly"  .  Drug use: No  . Sexual activity: Yes

## 2019-04-03 NOTE — Telephone Encounter (Signed)
I called and sw pt and he said that he needed a note that just advised of how long he has been a patient of this office. His first visit was 11/07/18 and the most recent being 03/26/19. Letter written and at front desk for pick up

## 2019-04-04 ENCOUNTER — Ambulatory Visit: Payer: Medicare Other | Admitting: Cardiovascular Disease

## 2019-04-04 NOTE — Progress Notes (Deleted)
Cardiology Office Note:   Date:  04/04/2019  NAME:  [name]    MRN: [id number] DOB:  09/02/1950   PCP:  [name], MD  Cardiologist:  Evalina Field, MD  Electrophysiologist:  None   Referring MD: [name], MD   No chief complaint on file. ***  History of Present Illness:   [name] is a 68 y.o. male with a hx of hypertension, diabetes, CAD, CKD who is being seen today for the evaluation of coronary artery disease at the request of [name], MD.  Past Medical History: Past Medical History:  Diagnosis Date  . Cellulitis and abscess of face 10/11/2007   Qualifier: Diagnosis of  By: Amil Amen MD, Benjamine Mola    . Diabetic retinopathy ([place])   . Diabetic retinopathy associated with type 2 diabetes mellitus ([place]) 09/13/2014   He works for [employer]  . Fluid overload 09/03/2012   Post op  . GERD (gastroesophageal reflux disease)   . Prostate cancer ([place])   . Visual impairment     Past Surgical History: Past Surgical History:  Procedure Laterality Date  . CATARACT EXTRACTION W/ INTRAOCULAR LENS  IMPLANT, BILATERAL    . EYE SURGERY Bilateral    "laser OR for diabetic retinopathy"  . INGUINAL HERNIA REPAIR     [name] 07/12/2010), "don't remember which side"  . LAPAROTOMY  08/23/2012   Procedure: EXPLORATORY LAPAROTOMY;  Surgeon: [name], DO;  [facility];  Service: General;  Laterality: N/A;  exploratory laparotomy with lysis of adhesions  . LYSIS OF ADHESION  08/23/2012   Procedure: LYSIS OF ADHESION;  Surgeon: [name], DO;  [facility];  Service: General;;  . ROBOT ASSISTED LAPAROSCOPIC RADICAL PROSTATECTOMY  2000's   "had to finish manually after machine broke"  . SHOULDER SURGERY  1970's   separation; from playing football"    Current Medications: No outpatient medications have been marked as taking for the 04/04/19 encounter (Appointment) with O'Neal, Cassie Freer, MD.     Allergies:    Ace inhibitors  and Naproxen   Social History: Social History   Socioeconomic History  . Marital status: Married    Spouse name: Not on file  . Number of children: Not on file  . Years of education: Not on file  . Highest education level: Not on file  Occupational History  . Not on file  Social Needs  . Financial resource strain: Not on file  . Food insecurity    Worry: Not on file    Inability: Not on file  . Transportation needs    Medical: Not on file    Non-medical: Not on file  Tobacco Use  . Smoking status: Former Smoker    Packs/day: 2.50    Years: 3.00    Pack years: 7.50    Types: Cigarettes    Quit date: 11/09/1966    Years since quitting: 52.4  . Smokeless tobacco: Never Used  Substance and Sexual Activity  . Alcohol use: Yes    Comment: 04/08/2015 "maybe a beer/ or 2 or a glass of wine monthly"  . Drug use: No  . Sexual activity: Yes  Lifestyle  . Physical activity    Days per week: Not on file    Minutes per session: Not on file  . Stress: Not on file  Relationships  . Social Herbalist on phone: Not on file    Gets together: Not on file  Attends religious service: Not on file    Active member of club or organization: Not on file    Attends meetings of clubs or organizations: Not on file    Relationship status: Not on file  Other Topics Concern  . Not on file  Social History Narrative  . Not on file     Family History: The patient's ***family history includes Diabetes Mellitus II in his mother. There is no history of Colon cancer.  ROS:   All other ROS reviewed and negative. Pertinent positives noted in the HPI.     EKGs/Labs/Other Studies Reviewed:   The following studies were personally reviewed by me today:  [facility] 60-65%, Grade I DD  NM Stress ([place] 09/2018): Mild ischemia of lateral wall, fixed inferior defect consistent with diaphragm attenuation   EKG:  EKG is *** ordered today.  The ekg ordered today  demonstrates ***, and was personally reviewed by me.   Recent Labs: 10/21/2018: Hemoglobin 9.2; Platelets 192 02/07/2019: ALT 28; BUN 23; Creatinine, Ser 1.64; Potassium 5.2; Sodium 142; TSH 0.970   Recent Lipid Panel    Component Value Date/Time   CHOL 135 02/07/2019 1156   TRIG 110 02/07/2019 1156   HDL 50 02/07/2019 1156   CHOLHDL 2.7 02/07/2019 1156   CHOLHDL 3.5 Ratio 11/20/2007 2022   VLDL 22 11/20/2007 2022   LDLCALC 63 02/07/2019 1156    Physical Exam:   VS:  There were no vitals taken for this visit.   Wt Readings from Last 3 Encounters:  04/03/19 176 lb (79.8 kg)  03/26/19 176 lb (79.8 kg)  03/12/19 176 lb (79.8 kg)    General: Well nourished, well developed, in no acute distress Heart: Atraumatic, normal size  Eyes: PEERLA, EOMI  Neck: Supple, no JVD Endocrine: No thryomegaly Cardiac: Normal S1, S2; RRR; no murmurs, rubs, or gallops Lungs: Clear to auscultation bilaterally, no wheezing, rhonchi or rales  Abd: Soft, nontender, no hepatomegaly  Ext: No edema, pulses 2+ Musculoskeletal: No deformities, BUE and BLE strength normal and equal Skin: Warm and dry, no rashes   Neuro: Alert and oriented to person, place, time, and situation, CNII-XII grossly intact, no focal deficits  Psych: Normal mood and affect   ASSESSMENT:   NAME@ is a 68 y.o. male who presents for the following: No diagnosis found.  PLAN:   There are no diagnoses linked to this encounter.  Disposition: No follow-ups on file.  Medication Adjustments/Labs and Tests Ordered: Current medicines are reviewed at length with the patient today.  Concerns regarding medicines are outlined above.  No orders of the defined types were placed in this encounter.  No orders of the defined types were placed in this encounter.   There are no Patient Instructions on file for this visit.   Signed, Addison Naegeli. Audie Box, [place]  [address], [place], [place] [address] [phone]  04/04/2019 6:28 AM

## 2019-04-05 ENCOUNTER — Other Ambulatory Visit: Payer: Self-pay | Admitting: Orthopedic Surgery

## 2019-04-05 DIAGNOSIS — E11621 Type 2 diabetes mellitus with foot ulcer: Secondary | ICD-10-CM

## 2019-04-11 ENCOUNTER — Ambulatory Visit: Payer: Medicare Other | Admitting: Family Medicine

## 2019-04-15 ENCOUNTER — Other Ambulatory Visit: Payer: Self-pay

## 2019-04-15 ENCOUNTER — Ambulatory Visit: Payer: Medicare Other | Admitting: Orthotics

## 2019-04-15 DIAGNOSIS — L089 Local infection of the skin and subcutaneous tissue, unspecified: Secondary | ICD-10-CM

## 2019-04-15 DIAGNOSIS — E08621 Diabetes mellitus due to underlying condition with foot ulcer: Secondary | ICD-10-CM

## 2019-04-15 DIAGNOSIS — L84 Corns and callosities: Secondary | ICD-10-CM

## 2019-04-15 DIAGNOSIS — E11628 Type 2 diabetes mellitus with other skin complications: Secondary | ICD-10-CM

## 2019-04-15 NOTE — Progress Notes (Signed)
Adjusted f/o to address DB ulcer plantar surface Left.

## 2019-04-16 ENCOUNTER — Encounter: Payer: Self-pay | Admitting: Family

## 2019-04-16 ENCOUNTER — Ambulatory Visit (INDEPENDENT_AMBULATORY_CARE_PROVIDER_SITE_OTHER): Payer: Medicare Other | Admitting: Family

## 2019-04-16 VITALS — Ht 66.0 in | Wt 176.0 lb

## 2019-04-16 DIAGNOSIS — E11610 Type 2 diabetes mellitus with diabetic neuropathic arthropathy: Secondary | ICD-10-CM

## 2019-04-16 DIAGNOSIS — L97421 Non-pressure chronic ulcer of left heel and midfoot limited to breakdown of skin: Secondary | ICD-10-CM | POA: Diagnosis not present

## 2019-04-16 DIAGNOSIS — E08621 Diabetes mellitus due to underlying condition with foot ulcer: Secondary | ICD-10-CM | POA: Diagnosis not present

## 2019-04-16 NOTE — Progress Notes (Signed)
Office Visit Note   Patient: [name]           Date of Birth: [birth year]           MRN: [id number] Visit Date: 04/16/2019              Requested by: [name], MD [address] [place],  [place] [address] PCP: [name], MD  Chief Complaint  Patient presents with  . Left Foot - Pain, Follow-up      HPI: The patient is a 68 year old gentleman seen today in follow-up for ulceration to his left foot.  He has had a longstanding history of a diabetic foot ulcer on the left.  He had previously been using a medical compression stocking in a cam walker.    States he has been doing Silvadene dressing changes.  Has been minimizing his weightbearing. Today is in a wheel chair. Has been out of work.   Has been to the foot center and gotten a custom orthotic from [facility]. Is pleased with this.  Assessment & Plan: Visit Diagnoses:  1. Diabetic ulcer of left midfoot associated with diabetes mellitus due to underlying condition, limited to breakdown of skin ([place])   2. Diabetic Charct's arthropathy ([place])     Plan: wound has improved. He will continue with the daily Dial soap cleansing of his left foot wound.  Apply his compression garment with direct skin contact.  Discussed nonweightbearing is the best thing he can do for himself.  Elevate around-the-clock.  Out of work.      Follow-Up Instructions: Return in about 20 days (around 05/06/2019).   Ortho Exam  Patient is alert, oriented, no adenopathy, well-dressed, normal affect, normal respiratory effort.  On examination of the left lower extremity there is 1+ pitting edema to the left lower extremity no erythema or cellulitis to the leg.  The plantar aspect of his midfoot there is a 35 mm in diameter ulcer this is 2 mm deep surrounding nonviable tissue was debrided with a 10 blade knife back to viable tissue.  There is no exposed bone or tendon.  No odor purulence or erythema.    Imaging: No results found. No images are  attached to the encounter.  Labs: Lab Results  Component Value Date   HGBA1C 12.0 (A) 02/07/2019   HGBA1C 13.9 (H) 10/19/2018   HGBA1C 11.9 (H) 07/18/2018   ESRSEDRATE 52 (H) 10/19/2018   CRP 9.9 (H) 10/19/2018   LABURIC 5.3 07/11/2010   REPTSTATUS 10/24/2018 FINAL 10/19/2018   CULT  10/19/2018    NO GROWTH 5 DAYS Performed at [facility], [place] [address] [place], [place] [address]      Lab Results  Component Value Date   ALBUMIN 3.9 02/07/2019   ALBUMIN 3.5 10/19/2018   ALBUMIN 4.3 07/18/2018   PREALBUMIN 10.9 (L) 10/19/2018   PREALBUMIN 11.7 (L) 08/30/2012   LABURIC 5.3 07/11/2010    Body mass index is 28.41 kg/m.  Orders:  No orders of the defined types were placed in this encounter.  No orders of the defined types were placed in this encounter.    Procedures: No procedures performed  Clinical Data: No additional findings.  ROS:  All other systems negative, except as noted in the HPI. Review of Systems  Constitutional: Negative for chills and fever.  Skin: Positive for wound.    Objective: Vital Signs: Ht 5\' 6"  (1.676 m)   Wt 176 lb (79.8 kg)   BMI 28.41  kg/m   Specialty Comments:  No specialty comments available.  PMFS History: Patient Active Problem List   Diagnosis Date Noted  . Moderate protein-calorie malnutrition ([place])   . Diabetic foot ulcer ([place]) 10/19/2018  . Diabetic foot infection ([place])   . Pseudophakia, both eyes 10/04/2018  . Stage 3 chronic kidney disease ([place]) 09/27/2018  . Adrenal insufficiency ([place]) 04/09/2015  . Peripheral edema 09/17/2014  . DOE (dyspnea on exertion) 09/17/2014  . Bronchospasm, acute 09/16/2014  . Partial small bowel obstruction ([place]) 09/16/2014  . Hyperglycemia   . Diabetic retinopathy associated with type 2 diabetes mellitus ([place]) 09/13/2014  . Abdominal pain 09/13/2014  . Cough 11/15/2012  . Pharyngitis 09/14/2012  . Fluid overload 09/03/2012  . Ileus following gastrointestinal surgery  ([place]) 09/03/2012  . Hypokalemia 08/30/2012  . Small bowel obstruction s/p LOA [id number] 08/21/2012  . Abnormal EKG 08/21/2012  . Chest pain 08/21/2012  . Macular degeneration (senile) of retina 12/15/2011  . Lens replaced 12/15/2011  . Vitreous hemorrhage ([place]) 12/15/2011  . Essential hypertension 08/19/2008  . Regional enteritis of small intestine ([place]) 01/03/2008  . ERECTILE DYSFUNCTION, ORGANIC 01/03/2008  . Sarcoidosis 07/19/2007  . Diabetes mellitus ([place]) 07/19/2007  . HYPERLIPIDEMIA, MIXED 07/19/2007  . CERUMEN IMPACTION, BILATERAL 07/19/2007  . URINARY INCONTINENCE, STRESS, MALE 08/17/2006  . PROSTATE CANCER, HX OF 08/17/2006  . DIABETIC  RETINOPATHY 01/09/2002   Past Medical History:  Diagnosis Date  . Cellulitis and abscess of face 10/11/2007   Qualifier: Diagnosis of  By: Amil Amen MD, Benjamine Mola    . Diabetic retinopathy ([place])   . Diabetic retinopathy associated with type 2 diabetes mellitus ([place]) 09/13/2014   He works for [employer]  . Fluid overload 09/03/2012   Post op  . GERD (gastroesophageal reflux disease)   . Prostate cancer ([place])   . Visual impairment     Family History  Problem Relation Age of Onset  . Diabetes Mellitus II Mother   . Colon cancer Neg Hx     Past Surgical History:  Procedure Laterality Date  . CATARACT EXTRACTION W/ INTRAOCULAR LENS  IMPLANT, BILATERAL    . EYE SURGERY Bilateral    "laser OR for diabetic retinopathy"  . INGUINAL HERNIA REPAIR     [name] 07/12/2010), "don't remember which side"  . LAPAROTOMY  08/23/2012   Procedure: EXPLORATORY LAPAROTOMY;  Surgeon: [name], DO;  [facility];  Service: General;  Laterality: N/A;  exploratory laparotomy with lysis of adhesions  . LYSIS OF ADHESION  08/23/2012   Procedure: LYSIS OF ADHESION;  Surgeon: [name], DO;  [facility];  Service: General;;  . ROBOT ASSISTED LAPAROSCOPIC RADICAL PROSTATECTOMY  2000's   "had to finish manually after machine broke"  .  SHOULDER SURGERY  1970's   separation; from playing football"   Social History   Occupational History  . Not on file  Tobacco Use  . Smoking status: Former Smoker    Packs/day: 2.50    Years: 3.00    Pack years: 7.50    Types: Cigarettes    Quit date: 11/09/1966    Years since quitting: 52.4  . Smokeless tobacco: Never Used  Substance and Sexual Activity  . Alcohol use: Yes    Comment: 04/08/2015 "maybe a beer/ or 2 or a glass of wine monthly"  . Drug use: No  . Sexual activity: Yes

## 2019-04-19 ENCOUNTER — Encounter: Payer: Self-pay | Admitting: Family Medicine

## 2019-04-19 ENCOUNTER — Ambulatory Visit (INDEPENDENT_AMBULATORY_CARE_PROVIDER_SITE_OTHER): Payer: Medicare Other | Admitting: Family Medicine

## 2019-04-19 ENCOUNTER — Other Ambulatory Visit: Payer: Self-pay

## 2019-04-19 VITALS — BP 170/60 | HR 63 | Temp 98.1°F | Ht 66.0 in | Wt 181.0 lb

## 2019-04-19 DIAGNOSIS — E08621 Diabetes mellitus due to underlying condition with foot ulcer: Secondary | ICD-10-CM

## 2019-04-19 DIAGNOSIS — I10 Essential (primary) hypertension: Secondary | ICD-10-CM | POA: Diagnosis not present

## 2019-04-19 DIAGNOSIS — N183 Chronic kidney disease, stage 3 unspecified: Secondary | ICD-10-CM

## 2019-04-19 DIAGNOSIS — E1165 Type 2 diabetes mellitus with hyperglycemia: Secondary | ICD-10-CM | POA: Diagnosis not present

## 2019-04-19 DIAGNOSIS — E782 Mixed hyperlipidemia: Secondary | ICD-10-CM

## 2019-04-19 DIAGNOSIS — Z794 Long term (current) use of insulin: Secondary | ICD-10-CM

## 2019-04-19 DIAGNOSIS — L97421 Non-pressure chronic ulcer of left heel and midfoot limited to breakdown of skin: Secondary | ICD-10-CM

## 2019-04-19 LAB — POCT GLYCOSYLATED HEMOGLOBIN (HGB A1C): Hemoglobin A1C: 11.7 % — AB (ref 4.0–5.6)

## 2019-04-19 MED ORDER — FUROSEMIDE 40 MG PO TABS: 40.0000 mg | ORAL_TABLET | Freq: Every day | ORAL | 3 refills | Status: DC

## 2019-04-19 MED ORDER — "PEN NEEDLES 5/16"" 31G X 8 MM MISC": 5 refills | Status: DC

## 2019-04-19 MED ORDER — INSULIN ISOPHANE & REGULAR (HUMAN 70-30)100 UNIT/ML KWIKPEN: PEN_INJECTOR | SUBCUTANEOUS | 3 refills | Status: DC

## 2019-04-19 NOTE — Patient Instructions (Signed)
° ° ° °  If you have lab work done today you will be contacted with your lab results within the next 2 weeks.  If you have not heard from us then please contact us. The fastest way to get your results is to register for My Chart. ° ° °IF you received an x-ray today, you will receive an invoice from [facility]. Please contact [facility] at [phone] with questions or concerns regarding your invoice.  ° °IF you received labwork today, you will receive an invoice from [facility]. Please contact [facility] at [phone] with questions or concerns regarding your invoice.  ° °Our billing staff will not be able to assist you with questions regarding bills from these companies. ° °You will be contacted with the lab results as soon as they are available. The fastest way to get your results is to activate your My Chart account. Instructions are located on the last page of this paperwork. If you have not heard from us regarding the results in 2 weeks, please contact this office. °  ° ° ° °

## 2019-04-19 NOTE — Progress Notes (Signed)
9/4/20209:46 AM  [name] 01/29/1951, 68 y.o., male [id number]  Chief Complaint  Patient presents with  . Diabetes  . Hypertension    does not take meds, last dose tues. Says he feels pulse in his ear and ringing    HPI:   Patient is a 68 y.o. 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 0 0 0  Number falls in past yr: 0 0 -  Injury with Fall? 0 0 -     Allergies  Allergen Reactions  . Ace Inhibitors Itching and Cough  . Naproxen Anaphylaxis, Shortness Of Breath and Other (See Comments)    Throat swells. cannot breathe, and causes GI distress    Prior to Admission medications   Medication Sig Start Date End Date Taking? Authorizing Provider  acetaminophen (TYLENOL) 650 MG CR tablet Take 650 mg by mouth every 8 (eight) hours as needed for pain.   Yes [provider]  aspirin 81 MG chewable tablet Chew 81 mg by mouth daily.   Yes [provider]  atorvastatin (LIPITOR) 20 MG tablet Take 20 mg by mouth daily. 09/27/18  Yes [provider]  blood glucose meter kit and supplies KIT  Dispense based on patient and insurance preference. Use up to four times daily as directed. (FOR ICD-9 250.00, 250.01). 10/24/18  Yes JDomenic Polite MD  doxycycline (VIBRAMYCIN) 100 MG capsule Take 1 capsule (100 mg total) by mouth 2 (two) times daily. 02/26/19  Yes ZDondra PraderR, NP  furosemide (LASIX) 40 MG tablet Take 40 mg by mouth daily. 09/27/18  Yes [provider]  gabapentin (NEURONTIN) 300 MG capsule TAKE 1 CAPSULE(300 MG) BY MOUTH THREE TIMES DAILY 02/12/19  Yes SRutherford Guys MD  Insulin Isophane & Regular Human (HUMULIN 70/30 MIX) (70-30) 100 UNIT/ML PEN Inject 20 Units into the skin 2 (two) times daily before a meal. 02/07/19  Yes SRutherford Guys MD  Lancets (FREESTYLE) lancets Use as instructed 09/18/14  Yes SDelfina Redwood MD  Lancets 30G [place] 04/06/16  Yes [provider]  losartan (COZAAR) 25 MG tablet Take 1 tablet (25 mg total) by mouth daily. 02/11/19  Yes SRutherford Guys MD  Multiple Vitamin (MULTIVITAMIN WITH MINERALS) TABS tablet Take 1 tablet by mouth daily.   Yes [provider]  oxyCODONE (OXY IR/ROXICODONE) 5 MG immediate release tablet Take 1 tablet (5 mg total) by mouth every 6 (six) hours as needed for severe pain. 11/22/18  Yes [place], SNeta Mends PA-C  polyethylene glycol (  MIRALAX / GLYCOLAX) packet Take 17 g by mouth daily as needed for mild constipation. 10/24/18  Yes Domenic Polite, MD  silver sulfADIAZINE (SILVADENE) 1 % cream APPLY 1 APPLICCATION TOPICALLY DAILY, LEFT FOOT 04/08/19  Yes Newt Minion, MD    Past Medical History:  Diagnosis Date  . Cellulitis and abscess of face 10/11/2007   Qualifier: Diagnosis of  By: Amil Amen MD, Benjamine Mola    . Diabetic retinopathy ([place])   . Diabetic retinopathy associated with type 2 diabetes mellitus ([place]) 09/13/2014   He works for [employer]  . Fluid overload 09/03/2012   Post op  . GERD (gastroesophageal reflux disease)   . Prostate cancer ([place])   . Visual  impairment     Past Surgical History:  Procedure Laterality Date  . CATARACT EXTRACTION W/ INTRAOCULAR LENS  IMPLANT, BILATERAL    . EYE SURGERY Bilateral    "laser OR for diabetic retinopathy"  . INGUINAL HERNIA REPAIR     [name] 07/12/2010), "don't remember which side"  . LAPAROTOMY  08/23/2012   Procedure: EXPLORATORY LAPAROTOMY;  Surgeon: [name], DO;  [facility];  Service: General;  Laterality: N/A;  exploratory laparotomy with lysis of adhesions  . LYSIS OF ADHESION  08/23/2012   Procedure: LYSIS OF ADHESION;  Surgeon: [name], DO;  [facility];  Service: General;;  . ROBOT ASSISTED LAPAROSCOPIC RADICAL PROSTATECTOMY  2000's   "had to finish manually after machine broke"  . SHOULDER SURGERY  1970's   separation; from playing football"    Social History   Tobacco Use  . Smoking status: Former Smoker    Packs/day: 2.50    Years: 3.00    Pack years: 7.50    Types: Cigarettes    Quit date: 11/09/1966    Years since quitting: 52.4  . Smokeless tobacco: Never Used  Substance Use Topics  . Alcohol use: Yes    Comment: 04/08/2015 "maybe a beer/ or 2 or a glass of wine monthly"    Family History  Problem Relation Age of Onset  . Diabetes Mellitus II Mother   . Colon cancer Neg Hx     Review of Systems  Constitutional: Negative for chills and fever.  Respiratory: Negative for cough and shortness of breath.   Cardiovascular: Negative for chest pain, palpitations and leg swelling.  Gastrointestinal: Negative for abdominal pain, nausea and vomiting.     OBJECTIVE:  Today's Vitals   04/19/19 0930 04/19/19 1028  BP: (!) 183/75 (!) 170/60  Pulse: 63   Temp: 98.1 F (36.7 C)   SpO2: 100%   Weight: 181 lb (82.1 kg)   Height: _0  (1.676 m)    Body mass index is 29.21 kg/m.  Lab Results  Component Value Date   HGBA1C 12.0 (A) 02/07/2019   HGBA1C 13.9 (H) 10/19/2018   HGBA1C 11.9 (H) 07/18/2018   Lab Results  Component Value Date   MICROALBUR  6.46 (H) 08/19/2008   LDLCALC 63 02/07/2019   CREATININE 1.64 (H) 02/07/2019    Physical Exam Vitals signs and nursing note reviewed.  Constitutional:      Appearance: He is well-developed.  HENT:     Head: Normocephalic and atraumatic.  Eyes:     Conjunctiva/sclera: Conjunctivae normal.     Pupils: Pupils are equal, round, and reactive to light.  Neck:     Musculoskeletal: Neck supple.     Vascular: No carotid bruit.  Cardiovascular:     Rate and Rhythm: Normal rate  and regular rhythm.     Heart sounds: No murmur. No friction rub. No gallop.   Pulmonary:     Effort: Pulmonary effort is normal.     Breath sounds: Normal breath sounds. No wheezing or rales.  Musculoskeletal:     Left lower leg: Edema present.  Skin:    General: Skin is warm and dry.  Neurological:     Mental Status: He is alert and oriented to person, place, and time.     Results for orders placed or performed in visit on 04/19/19 (from the past 24 hour(s))  POCT glycosylated hemoglobin (Hb A1C)     Status: Abnormal   Collection Time: 04/19/19 10:14 AM  Result Value Ref Range   Hemoglobin A1C 11.7 (A) 4.0 - 5.6 %   HbA1c POC (<> result, manual entry)     HbA1c, POC (prediabetic range)     HbA1c, POC (controlled diabetic range)      No results found.   ASSESSMENT and PLAN  1. Type 2 diabetes mellitus with hyperglycemia, with long-term current use of insulin (HCC) Uncontrolled. Titration of 70/30, finances limiting factor. discussed importance of glucose control. Referring to endo.  - POCT glycosylated hemoglobin (Hb A1C) - Ambulatory referral to Endocrinology  2. Diabetic ulcer of left midfoot associated with diabetes mellitus due to underlying condition, limited to breakdown of skin ([place]) Ulcer better, under ortho care.   3. Essential hypertension Above goal. Has not taken meds today. Recheck BP 1 week nurse visit. - Care order/instruction:  4. HYPERLIPIDEMIA, MIXED Checking labs today,  medications will be adjusted as needed.  - Lipid panel  5. Stage 3 chronic kidney disease (HCC) Complicated with microalbuminuria. Started losartan. Checking labs, titrate as tolerated.  - Comprehensive metabolic panel   Other orders - Insulin Isophane & Regular Human (HUMULIN 70/30 MIX) (70-30) 100 UNIT/ML PEN; Inject 26 Units into the skin daily before breakfast AND 30 Units daily before supper. - furosemide (LASIX) 40 MG tablet; Take 1 tablet (40 mg total) by mouth daily. - Insulin Pen Needle (PEN NEEDLES 31GX5/16") 31G X 8 MM MISC; Use new needle with each insulin injection, twice a day. Dx E11.65, Z79.4  Return in about 4 weeks (around 05/17/2019).    [name], MD Primary Care at [facility] [place], [place] [address] Ph.  [phone] Fax [phone]

## 2019-04-20 LAB — LIPID PANEL
Chol/HDL Ratio: 3 ratio (ref 0.0–5.0)
Cholesterol, Total: 158 mg/dL (ref 100–199)
HDL: 53 mg/dL (ref >39)
LDL Chol Calc (NIH): 87 mg/dL (ref 0–99)
Triglycerides: 96 mg/dL (ref 0–149)
VLDL Cholesterol Cal: 18 mg/dL (ref 5–40)

## 2019-04-20 LAB — COMPREHENSIVE METABOLIC PANEL
ALT: 25 IU/L (ref 0–44)
AST: 24 IU/L (ref 0–40)
Albumin/Globulin Ratio: 1.2 (ref 1.2–2.2)
Albumin: 3.6 g/dL — ABNORMAL LOW (ref 3.8–4.8)
Alkaline Phosphatase: 126 IU/L — ABNORMAL HIGH (ref 39–117)
BUN/Creatinine Ratio: 18 (ref 10–24)
BUN: 31 mg/dL — ABNORMAL HIGH (ref 8–27)
Bilirubin Total: 0.5 mg/dL (ref 0.0–1.2)
CO2: 24 mmol/L (ref 20–29)
Calcium: 9.5 mg/dL (ref 8.6–10.2)
Chloride: 104 mmol/L (ref 96–106)
Creatinine, Ser: 1.69 mg/dL — ABNORMAL HIGH (ref 0.76–1.27)
GFR calc Af Amer: 47 mL/min/{1.73_m2} — ABNORMAL LOW (ref >59)
GFR calc non Af Amer: 41 mL/min/{1.73_m2} — ABNORMAL LOW (ref >59)
Globulin, Total: 2.9 g/dL (ref 1.5–4.5)
Glucose: 139 mg/dL — ABNORMAL HIGH (ref 65–99)
Potassium: 4.3 mmol/L (ref 3.5–5.2)
Sodium: 141 mmol/L (ref 134–144)
Total Protein: 6.5 g/dL (ref 6.0–8.5)

## 2019-04-26 ENCOUNTER — Ambulatory Visit (INDEPENDENT_AMBULATORY_CARE_PROVIDER_SITE_OTHER): Payer: Medicare Other | Admitting: Family Medicine

## 2019-04-26 ENCOUNTER — Other Ambulatory Visit: Payer: Self-pay

## 2019-04-26 VITALS — BP 187/73 | HR 66 | Wt 182.8 lb

## 2019-04-26 DIAGNOSIS — I10 Essential (primary) hypertension: Secondary | ICD-10-CM

## 2019-04-26 MED ORDER — AMLODIPINE BESYLATE 5 MG PO TABS: 5.0000 mg | ORAL_TABLET | Freq: Every day | ORAL | 0 refills | Status: DC

## 2019-04-26 NOTE — Patient Instructions (Signed)
° ° ° °  If you have lab work done today you will be contacted with your lab results within the next 2 weeks.  If you have not heard from us then please contact us. The fastest way to get your results is to register for My Chart. ° ° °IF you received an x-ray today, you will receive an invoice from [facility]. Please contact [facility] at [phone] with questions or concerns regarding your invoice.  ° °IF you received labwork today, you will receive an invoice from [facility]. Please contact [facility] at [phone] with questions or concerns regarding your invoice.  ° °Our billing staff will not be able to assist you with questions regarding bills from these companies. ° °You will be contacted with the lab results as soon as they are available. The fastest way to get your results is to activate your My Chart account. Instructions are located on the last page of this paperwork. If you have not heard from us regarding the results in 2 weeks, please contact this office. °  ° ° ° °

## 2019-04-30 ENCOUNTER — Other Ambulatory Visit: Payer: Self-pay

## 2019-04-30 ENCOUNTER — Telehealth: Payer: Self-pay | Admitting: Podiatry

## 2019-04-30 ENCOUNTER — Encounter: Payer: Self-pay | Admitting: Podiatry

## 2019-04-30 ENCOUNTER — Ambulatory Visit (INDEPENDENT_AMBULATORY_CARE_PROVIDER_SITE_OTHER): Payer: Medicare Other | Admitting: Podiatry

## 2019-04-30 ENCOUNTER — Ambulatory Visit: Payer: Medicare Other | Admitting: Podiatry

## 2019-04-30 ENCOUNTER — Ambulatory Visit (INDEPENDENT_AMBULATORY_CARE_PROVIDER_SITE_OTHER): Payer: Medicare Other

## 2019-04-30 ENCOUNTER — Encounter: Payer: Self-pay | Admitting: Sports Medicine

## 2019-04-30 VITALS — Temp 99.3°F

## 2019-04-30 DIAGNOSIS — L97422 Non-pressure chronic ulcer of left heel and midfoot with fat layer exposed: Secondary | ICD-10-CM

## 2019-04-30 DIAGNOSIS — E11610 Type 2 diabetes mellitus with diabetic neuropathic arthropathy: Secondary | ICD-10-CM

## 2019-04-30 DIAGNOSIS — E11621 Type 2 diabetes mellitus with foot ulcer: Secondary | ICD-10-CM | POA: Diagnosis not present

## 2019-04-30 DIAGNOSIS — E1142 Type 2 diabetes mellitus with diabetic polyneuropathy: Secondary | ICD-10-CM | POA: Diagnosis not present

## 2019-04-30 MED ORDER — DOXYCYCLINE HYCLATE 100 MG PO TABS: 100.0000 mg | ORAL_TABLET | Freq: Two times a day (BID) | ORAL | 0 refills | Status: DC

## 2019-04-30 NOTE — Telephone Encounter (Signed)
Pt did not get Iodosorb. Would like Rx sent to [facility] # [phone].

## 2019-04-30 NOTE — Telephone Encounter (Signed)
Dr. Elisha Ponder states her assistant, Drema Halon, CMA gave him two creams. Pt states he did not get the Iodosorb. Heather talked pt through the 2 [facility] bag contents and pt found the Iodosorb.

## 2019-04-30 NOTE — Progress Notes (Addendum)
Subjective: Patient presents today with diabetic foot care. Patient has concern of wound of the left foot. He states foot is tender with weightbearing. He was hospitalized for a diabetic foot infection in March and has been followed by Dr. [name] for the wound. Last instructions were for him to keep wound clean with Dial soap and continue to wear boot provided by Dr. Jess Barters office. He is not wearing the boot today stating he left it in his car and his wife has driven to [place] in the car. He arrived to his appointment today via [facility]. He also states he has been applying Silvadene Cream to wound as well. Today, he denies any fever, chills, nightsweats, nausea or vomiting.  [name], MD is his PCP. Last visit was 04/26/2019.   Allergies  Allergen Reactions  . Ace Inhibitors Itching and Cough  . Naproxen Anaphylaxis, Shortness Of Breath and Other (See Comments)    Throat swells. cannot breathe, and causes GI distress     Objective: Vitals:   04/30/19 1205  Temp: 99.3 F (37.4 C)    Vascular Examination: Capillary refill time immediate x 10 digits.  Dorsalis pedis pulses faintly palpable.  Posterior tibial pulses nonpalpable b/l.  Digital hair absent x 10 digits.  Skin temperature gradient WNL b/l.  +1 pitting edema left>right.  No pain with calf compression b/l LE.  Dermatological Examination: Skin with chronic venous stasis skin changes b/l LE.  Toenails 1-5 b/l discolored, thick, dystrophic with subungual debris and pain with palpation to nailbeds due to thickness of nails.    Ulceration located plantarlateral left foot tarsometatarsal junction.  Predebridement measurements carried out today of 4.5 x 5.5 cm. +Hyperkeratotic roof.  There is no periulcerative erythema. There is edema. There is malodor present. No purulent drainage expressed. He does have tenderness to palpation lateral 5th met-cuneiform joint.  Postdebridement measurements today are: 4.5 x 5.0 x 0.2  cm with macerated border and centralized area of granulation tissue. Odor remains.  No tracking, no tunneling, no probing to bone, no undermining, no active pus or purulence noted.   Musculoskeletal: Muscle strength 5/5 to all LE muscle groups b/l.  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  Charcot plantarlateral midfoot left foot.   Neurological: Sensation diminished b/l with 10 gram monofilament.  Vibratory sensation diminished b/l.  Xrays left foot reveal vessel calcification and progressive Charcot remodeling at tarso-metatarsal joints when compared to July 14th xray. Apex of Charcot deformity appears to be at 4th met-cuboid joint. Plantar calcaneal spur present.    Hemoglobin A1C Latest Ref Rng & Units 04/19/2019 02/07/2019 10/19/2018 07/18/2018  HGBA1C 4.0 - 5.6 % 11.7(A) 12.0(A) 13.9(H) 11.9(H)  Some recent data might be hidden    Assessment: 1. Diabetic Ulceration Left foot in presence of Charcot Neuroarthropathy 2. NIDDM with peripheral neuropathy  Plan: 1. Mr. Sainato was also evaluated by Dr. [name] on today's visit. Darco shoe with PegAssist System was modified by [employer], [employer] today. 2. Patient can benefit from Social Services and mental health screening for depression given his current situation with stressors of finances and health issues. Will contact his PCP regarding this.  3. Left foot ulcer was debrided and reactive hyperkeratoses and necrotic tissue was resected to the level of healthy bleeding tissue. Ulcer was cleansed with wound cleanser. Iodosorb Gel was applied to base of wound with light dressing. 4. Wound culture of diabetic foot ulcer taken on today. 5. Xray left foot was taken and reviewed with Mr. Mogle on today.  6. Rx sent for Doxycycline 100 mg po bid x 14 days. 7. Surgical shoe was dispensed with Peg-Assist offloading for left foot ulcer 8. Patient was given written instructions for daily dressing changes/aftercare and was instructed to call  immediately if any signs or symptoms of infection arise.  9. Patient instructed to report to emergency department with worsening appearance of ulcer/toe/foot, increased pain, foul odor, increased redness, swelling, drainage, fever, chills, nightsweats, nausea, vomiting, increased blood sugar. 10. Toenails 1-5 b/l debrided in length and girth without complication.  11. He is to remain off work for now.  12. Patient/POA related understanding. 13. Follow up 2 weeks with Dr. [name].  14. Patient/POA to call should there be a concern in the interim.

## 2019-04-30 NOTE — Patient Instructions (Addendum)
DRESSING CHANGES LEFT FOOT:  WEAR SURGICAL SHOE/BOOT AT ALL TIMES    1. KEEP LEFT  FOOT DRY AT ALL TIMES!!!!  2. CLEANSE ULCER WITH SALINE.  3. DAB DRY WITH GAUZE SPONGE.  4. APPLY A LIGHT AMOUNT OF IODOSORB TO BASE OF ULCER.  5. APPLY OUTER DRESSING/BAND-AID AS INSTRUCTED.  6. WEAR SURGICAL SHOE/BOOT DAILY AT ALL TIMES.  7. DO NOT WALK BAREFOOT!!!  8.  IF YOU EXPERIENCE ANY FEVER, CHILLS, NIGHTSWEATS, NAUSEA OR VOMITING, ELEVATED OR LOW BLOOD SUGARS, REPORT TO EMERGENCY ROOM.  9. IF YOU EXPERIENCE INCREASED REDNESS, PAIN, SWELLING, DISCOLORATION, ODOR, PUS, DRAINAGE OR WARMTH OF YOUR FOOT, REPORT TO EMERGENCY ROOM.   TAKE DOXYCYCLINE 100MG  TWICE A DAY FOR FOURTEEN DAYS   Diabetes Mellitus and Foot Care Foot care is an important part of your health, especially when you have diabetes. Diabetes may cause you to have problems because of poor blood flow (circulation) to your feet and legs, which can cause your skin to:  Become thinner and drier.  Break more easily.  Heal more slowly.  Peel and crack. You may also have nerve damage (neuropathy) in your legs and feet, causing decreased feeling in them. This means that you may not notice minor injuries to your feet that could lead to more serious problems. Noticing and addressing any potential problems early is the best way to prevent future foot problems. How to care for your feet Foot hygiene  Wash your feet daily with warm water and mild soap. Do not use hot water. Then, pat your feet and the areas between your toes until they are completely dry. Do not soak your feet as this can dry your skin.  Trim your toenails straight across. Do not dig under them or around the cuticle. File the edges of your nails with an emery board or nail file.  Apply a moisturizing lotion or petroleum jelly to the skin on your feet and to dry, brittle toenails. Use lotion that does not contain alcohol and is unscented. Do not apply lotion between  your toes. Shoes and socks  Wear clean socks or stockings every day. Make sure they are not too tight. Do not wear knee-high stockings since they may decrease blood flow to your legs.  Wear shoes that fit properly and have enough cushioning. Always look in your shoes before you put them on to be sure there are no objects inside.  To break in new shoes, wear them for just a few hours a day. This prevents injuries on your feet. Wounds, scrapes, corns, and calluses  Check your feet daily for blisters, cuts, bruises, sores, and redness. If you cannot see the bottom of your feet, use a mirror or ask someone for help.  Do not cut corns or calluses or try to remove them with medicine.  If you find a minor scrape, cut, or break in the skin on your feet, keep it and the skin around it clean and dry. You may clean these areas with mild soap and water. Do not clean the area with peroxide, alcohol, or iodine.  If you have a wound, scrape, corn, or callus on your foot, look at it several times a day to make sure it is healing and not infected. Check for: ? Redness, swelling, or pain. ? Fluid or blood. ? Warmth. ? Pus or a bad smell. General instructions  Do not cross your legs. This may decrease blood flow to your feet.  Do not use heating pads or  hot water bottles on your feet. They may burn your skin. If you have lost feeling in your feet or legs, you may not know this is happening until it is too late.  Protect your feet from hot and cold by wearing shoes, such as at the beach or on hot pavement.  Schedule a complete foot exam at least once a year (annually) or more often if you have foot problems. If you have foot problems, report any cuts, sores, or bruises to your health care provider immediately. Contact a health care provider if:  You have a medical condition that increases your risk of infection and you have any cuts, sores, or bruises on your feet.  You have an injury that is not  healing.  You have redness on your legs or feet.  You feel burning or tingling in your legs or feet.  You have pain or cramps in your legs and feet.  Your legs or feet are numb.  Your feet always feel cold.  You have pain around a toenail. Get help right away if:  You have a wound, scrape, corn, or callus on your foot and: ? You have pain, swelling, or redness that gets worse. ? You have fluid or blood coming from the wound, scrape, corn, or callus. ? Your wound, scrape, corn, or callus feels warm to the touch. ? You have pus or a bad smell coming from the wound, scrape, corn, or callus. ? You have a fever. ? You have a red line going up your leg. Summary  Check your feet every day for cuts, sores, red spots, swelling, and blisters.  Moisturize feet and legs daily.  Wear shoes that fit properly and have enough cushioning.  If you have foot problems, report any cuts, sores, or bruises to your health care provider immediately.  Schedule a complete foot exam at least once a year (annually) or more often if you have foot problems. This information is not intended to replace advice given to you by your health care provider. Make sure you discuss any questions you have with your health care provider. Document Released: 07/29/2000 Document Revised: 09/13/2017 Document Reviewed: 09/02/2016 Elsevier Patient Education  2020 [employer].

## 2019-04-30 NOTE — Progress Notes (Signed)
Patient was seen and evaluated with Dr. Elisha Ponder. Agree with assessment and plan as dictated by Dr. Elisha Ponder. Patient to follow up in office in 2 weeks for continued diabetic wound care.

## 2019-05-02 ENCOUNTER — Telehealth: Payer: Self-pay | Admitting: *Deleted

## 2019-05-02 ENCOUNTER — Telehealth: Payer: Self-pay | Admitting: Family Medicine

## 2019-05-02 NOTE — Telephone Encounter (Signed)
Patient called back says dr Fatima Sanger lvm to call her  Did not see a call out

## 2019-05-02 NOTE — Telephone Encounter (Signed)
Pt states he is returning Dr. Heber [place] call.

## 2019-05-03 ENCOUNTER — Telehealth: Payer: Self-pay

## 2019-05-03 NOTE — Telephone Encounter (Signed)
Spoke to [place] at [place] [phone] and added susceptibility to his existing wound culture, submitted on 04/30/19

## 2019-05-03 NOTE — Telephone Encounter (Signed)
I returned patient's phone call today. Since Dr. [name] is out of the office next week, I have asked him to come in for follow up on Tuesday, May 07, 2019. Patient agreed to come in for the appointment. One of our schedulers will call him this morning to schedule the appointment for Sept. 22nd.

## 2019-05-05 ENCOUNTER — Emergency Department (HOSPITAL_COMMUNITY): Payer: Medicare Other

## 2019-05-05 ENCOUNTER — Inpatient Hospital Stay (HOSPITAL_COMMUNITY)
Admission: EM | Admit: 2019-05-05 | Discharge: 2019-05-09 | DRG: 389 | Disposition: A | Discharge status: Home Health | Payer: Medicare Other | Attending: Internal Medicine | Admitting: Internal Medicine

## 2019-05-05 ENCOUNTER — Other Ambulatory Visit: Payer: Self-pay

## 2019-05-05 DIAGNOSIS — H547 Unspecified visual loss: Secondary | ICD-10-CM | POA: Diagnosis present

## 2019-05-05 DIAGNOSIS — Z9079 Acquired absence of other genital organ(s): Secondary | ICD-10-CM

## 2019-05-05 DIAGNOSIS — E11621 Type 2 diabetes mellitus with foot ulcer: Secondary | ICD-10-CM | POA: Diagnosis present

## 2019-05-05 DIAGNOSIS — R52 Pain, unspecified: Secondary | ICD-10-CM

## 2019-05-05 DIAGNOSIS — D860 Sarcoidosis of lung: Secondary | ICD-10-CM | POA: Diagnosis present

## 2019-05-05 DIAGNOSIS — E1165 Type 2 diabetes mellitus with hyperglycemia: Secondary | ICD-10-CM | POA: Diagnosis present

## 2019-05-05 DIAGNOSIS — E11628 Type 2 diabetes mellitus with other skin complications: Secondary | ICD-10-CM | POA: Diagnosis present

## 2019-05-05 DIAGNOSIS — M14672 Charcot's joint, left ankle and foot: Secondary | ICD-10-CM

## 2019-05-05 DIAGNOSIS — Z833 Family history of diabetes mellitus: Secondary | ICD-10-CM

## 2019-05-05 DIAGNOSIS — E11649 Type 2 diabetes mellitus with hypoglycemia without coma: Secondary | ICD-10-CM | POA: Diagnosis not present

## 2019-05-05 DIAGNOSIS — N184 Chronic kidney disease, stage 4 (severe): Secondary | ICD-10-CM | POA: Diagnosis present

## 2019-05-05 DIAGNOSIS — K219 Gastro-esophageal reflux disease without esophagitis: Secondary | ICD-10-CM | POA: Diagnosis present

## 2019-05-05 DIAGNOSIS — Z79899 Other long term (current) drug therapy: Secondary | ICD-10-CM

## 2019-05-05 DIAGNOSIS — Z794 Long term (current) use of insulin: Secondary | ICD-10-CM

## 2019-05-05 DIAGNOSIS — E1122 Type 2 diabetes mellitus with diabetic chronic kidney disease: Secondary | ICD-10-CM | POA: Diagnosis present

## 2019-05-05 DIAGNOSIS — L97529 Non-pressure chronic ulcer of other part of left foot with unspecified severity: Secondary | ICD-10-CM | POA: Diagnosis present

## 2019-05-05 DIAGNOSIS — E785 Hyperlipidemia, unspecified: Secondary | ICD-10-CM | POA: Diagnosis present

## 2019-05-05 DIAGNOSIS — Z20828 Contact with and (suspected) exposure to other viral communicable diseases: Secondary | ICD-10-CM | POA: Diagnosis present

## 2019-05-05 DIAGNOSIS — I129 Hypertensive chronic kidney disease with stage 1 through stage 4 chronic kidney disease, or unspecified chronic kidney disease: Secondary | ICD-10-CM | POA: Diagnosis present

## 2019-05-05 DIAGNOSIS — D509 Iron deficiency anemia, unspecified: Secondary | ICD-10-CM | POA: Diagnosis present

## 2019-05-05 DIAGNOSIS — E782 Mixed hyperlipidemia: Secondary | ICD-10-CM | POA: Diagnosis present

## 2019-05-05 DIAGNOSIS — E2740 Unspecified adrenocortical insufficiency: Secondary | ICD-10-CM | POA: Diagnosis present

## 2019-05-05 DIAGNOSIS — R001 Bradycardia, unspecified: Secondary | ICD-10-CM | POA: Diagnosis present

## 2019-05-05 DIAGNOSIS — R112 Nausea with vomiting, unspecified: Secondary | ICD-10-CM | POA: Diagnosis not present

## 2019-05-05 DIAGNOSIS — E11610 Type 2 diabetes mellitus with diabetic neuropathic arthropathy: Secondary | ICD-10-CM

## 2019-05-05 DIAGNOSIS — I10 Essential (primary) hypertension: Secondary | ICD-10-CM | POA: Diagnosis present

## 2019-05-05 DIAGNOSIS — F419 Anxiety disorder, unspecified: Secondary | ICD-10-CM | POA: Diagnosis present

## 2019-05-05 DIAGNOSIS — R1084 Generalized abdominal pain: Secondary | ICD-10-CM

## 2019-05-05 DIAGNOSIS — Z886 Allergy status to analgesic agent status: Secondary | ICD-10-CM

## 2019-05-05 DIAGNOSIS — R0600 Dyspnea, unspecified: Secondary | ICD-10-CM

## 2019-05-05 DIAGNOSIS — Z9842 Cataract extraction status, left eye: Secondary | ICD-10-CM

## 2019-05-05 DIAGNOSIS — D869 Sarcoidosis, unspecified: Secondary | ICD-10-CM | POA: Diagnosis present

## 2019-05-05 DIAGNOSIS — Z961 Presence of intraocular lens: Secondary | ICD-10-CM | POA: Diagnosis present

## 2019-05-05 DIAGNOSIS — Z0189 Encounter for other specified special examinations: Secondary | ICD-10-CM

## 2019-05-05 DIAGNOSIS — L97521 Non-pressure chronic ulcer of other part of left foot limited to breakdown of skin: Secondary | ICD-10-CM

## 2019-05-05 DIAGNOSIS — Z888 Allergy status to other drugs, medicaments and biological substances status: Secondary | ICD-10-CM

## 2019-05-05 DIAGNOSIS — E11319 Type 2 diabetes mellitus with unspecified diabetic retinopathy without macular edema: Secondary | ICD-10-CM | POA: Diagnosis present

## 2019-05-05 DIAGNOSIS — I083 Combined rheumatic disorders of mitral, aortic and tricuspid valves: Secondary | ICD-10-CM | POA: Diagnosis present

## 2019-05-05 DIAGNOSIS — K565 Intestinal adhesions [bands], unspecified as to partial versus complete obstruction: Principal | ICD-10-CM | POA: Diagnosis present

## 2019-05-05 DIAGNOSIS — R1110 Vomiting, unspecified: Secondary | ICD-10-CM

## 2019-05-05 DIAGNOSIS — Z978 Presence of other specified devices: Secondary | ICD-10-CM

## 2019-05-05 DIAGNOSIS — R0602 Shortness of breath: Secondary | ICD-10-CM

## 2019-05-05 DIAGNOSIS — Z4659 Encounter for fitting and adjustment of other gastrointestinal appliance and device: Secondary | ICD-10-CM

## 2019-05-05 DIAGNOSIS — Z8546 Personal history of malignant neoplasm of prostate: Secondary | ICD-10-CM

## 2019-05-05 DIAGNOSIS — E08621 Diabetes mellitus due to underlying condition with foot ulcer: Secondary | ICD-10-CM

## 2019-05-05 DIAGNOSIS — Z9841 Cataract extraction status, right eye: Secondary | ICD-10-CM

## 2019-05-05 DIAGNOSIS — N183 Chronic kidney disease, stage 3 (moderate): Secondary | ICD-10-CM | POA: Diagnosis present

## 2019-05-05 DIAGNOSIS — Z7982 Long term (current) use of aspirin: Secondary | ICD-10-CM

## 2019-05-05 DIAGNOSIS — R9439 Abnormal result of other cardiovascular function study: Secondary | ICD-10-CM | POA: Diagnosis present

## 2019-05-05 DIAGNOSIS — L089 Local infection of the skin and subcutaneous tissue, unspecified: Secondary | ICD-10-CM | POA: Diagnosis present

## 2019-05-05 DIAGNOSIS — I442 Atrioventricular block, complete: Secondary | ICD-10-CM | POA: Diagnosis present

## 2019-05-05 DIAGNOSIS — Z87891 Personal history of nicotine dependence: Secondary | ICD-10-CM

## 2019-05-05 DIAGNOSIS — K56609 Unspecified intestinal obstruction, unspecified as to partial versus complete obstruction: Secondary | ICD-10-CM

## 2019-05-05 LAB — CBC WITH DIFFERENTIAL/PLATELET
Abs Immature Granulocytes: 0.01 10*3/uL (ref 0.00–0.07)
Basophils Absolute: 0 10*3/uL (ref 0.0–0.1)
Basophils Relative: 1 %
Eosinophils Absolute: 0.2 10*3/uL (ref 0.0–0.5)
Eosinophils Relative: 4 %
HCT: 34.8 % — ABNORMAL LOW (ref 39.0–52.0)
Hemoglobin: 10.9 g/dL — ABNORMAL LOW (ref 13.0–17.0)
Immature Granulocytes: 0 %
Lymphocytes Relative: 29 %
Lymphs Abs: 1.1 10*3/uL (ref 0.7–4.0)
MCH: 21.2 pg — ABNORMAL LOW (ref 26.0–34.0)
MCHC: 31.3 g/dL (ref 30.0–36.0)
MCV: 67.7 fL — ABNORMAL LOW (ref 80.0–100.0)
Monocytes Absolute: 0.5 10*3/uL (ref 0.1–1.0)
Monocytes Relative: 14 %
Neutro Abs: 1.9 10*3/uL (ref 1.7–7.7)
Neutrophils Relative %: 52 %
Platelets: 202 10*3/uL (ref 150–400)
RBC: 5.14 MIL/uL (ref 4.22–5.81)
RDW: 17.7 % — ABNORMAL HIGH (ref 11.5–15.5)
WBC: 3.6 10*3/uL — ABNORMAL LOW (ref 4.0–10.5)
nRBC: 0 % (ref 0.0–0.2)

## 2019-05-05 LAB — WOUND CULTURE
MICRO NUMBER:: 883898
SPECIMEN QUALITY:: ADEQUATE

## 2019-05-05 LAB — COMPREHENSIVE METABOLIC PANEL
ALT: 28 U/L (ref 0–44)
AST: 33 U/L (ref 15–41)
Albumin: 3.4 g/dL — ABNORMAL LOW (ref 3.5–5.0)
Alkaline Phosphatase: 77 U/L (ref 38–126)
Anion gap: 8 (ref 5–15)
BUN: 23 mg/dL (ref 8–23)
CO2: 23 mmol/L (ref 22–32)
Calcium: 8.8 mg/dL — ABNORMAL LOW (ref 8.9–10.3)
Chloride: 102 mmol/L (ref 98–111)
Creatinine, Ser: 1.69 mg/dL — ABNORMAL HIGH (ref 0.61–1.24)
GFR calc Af Amer: 47 mL/min — ABNORMAL LOW (ref >60)
GFR calc non Af Amer: 41 mL/min — ABNORMAL LOW (ref >60)
Glucose, Bld: 127 mg/dL — ABNORMAL HIGH (ref 70–99)
Potassium: 4.2 mmol/L (ref 3.5–5.1)
Sodium: 133 mmol/L — ABNORMAL LOW (ref 135–145)
Total Bilirubin: 1.1 mg/dL (ref 0.3–1.2)
Total Protein: 6.9 g/dL (ref 6.5–8.1)

## 2019-05-05 LAB — MAGNESIUM: Magnesium: 1.9 mg/dL (ref 1.7–2.4)

## 2019-05-05 LAB — LIPASE, BLOOD: Lipase: 19 U/L (ref 11–51)

## 2019-05-05 MED ORDER — FENTANYL CITRATE (PF) 100 MCG/2ML IJ SOLN
50.0000 ug | INTRAMUSCULAR | Status: DC | PRN
  Administered 2019-05-05: 50 ug via INTRAVENOUS
  Filled 2019-05-05: qty 2

## 2019-05-05 MED ORDER — SODIUM CHLORIDE 0.9 % IV BOLUS
500.0000 mL | Freq: Once | INTRAVENOUS | Status: AC
  Administered 2019-05-05: 22:00:00 500 mL via INTRAVENOUS

## 2019-05-05 MED ORDER — IOHEXOL 300 MG/ML  SOLN
100.0000 mL | Freq: Once | INTRAMUSCULAR | Status: AC | PRN
  Administered 2019-05-05: 100 mL via INTRAVENOUS

## 2019-05-05 MED ORDER — MORPHINE SULFATE (PF) 4 MG/ML IV SOLN
4.0000 mg | Freq: Once | INTRAVENOUS | Status: AC
  Administered 2019-05-05: 4 mg via INTRAVENOUS
  Filled 2019-05-05: qty 1

## 2019-05-05 NOTE — ED Provider Notes (Addendum)
[facility] EMERGENCY DEPARTMENT Provider Note   CSN: [id number] Arrival date & time: 05/05/19  2108     History   Chief Complaint Chief Complaint  Patient presents with  . Abdominal Pain    HPI [name] is a 68 y.o. male.     Patient with uncontrolled diabetes history, left foot ulcer, high blood pressure, bowel obstruction presents with worsening abdominal pain and vomiting that started after eating yogurt today.  Patient had small amount of diarrhea.  Patient denies any Heart attack, heart failure or heart block history.  Patient denies new medications.       Past Medical History:  Diagnosis Date  . Cellulitis and abscess of face 10/11/2007   Qualifier: Diagnosis of  By: Amil Amen MD, Benjamine Mola    . Diabetic retinopathy ([place])   . Diabetic retinopathy associated with type 2 diabetes mellitus ([place]) 09/13/2014   He works for [employer]  . Fluid overload 09/03/2012   Post op  . GERD (gastroesophageal reflux disease)   . Visual impairment     Patient Active Problem List   Diagnosis Date Noted  . Ulcer of left foot due to type 2 diabetes mellitus ([place])   . Skin ulcer of left foot, limited to breakdown of skin ([place])   . Charcot foot due to diabetes mellitus ([place])   . Charcot's joint of foot, left   . SBO (small bowel obstruction) ([place]) 05/06/2019  . Complete heart block ([place]) 05/06/2019  . Moderate protein-calorie malnutrition ([place])   . Diabetic foot ulcer ([place]) 10/19/2018  . Diabetic foot infection ([place])   . Pseudophakia, both eyes 10/04/2018  . Stage 3 chronic kidney disease ([place]) 09/27/2018  . Adrenal insufficiency ([place]) 04/09/2015  . Peripheral edema 09/17/2014  . Shortness of breath 09/17/2014  . Bronchospasm, acute 09/16/2014  . Partial small bowel obstruction ([place]) 09/16/2014  . Hyperglycemia   . Diabetic retinopathy associated with type 2 diabetes mellitus ([place]) 09/13/2014  . Abdominal pain 09/13/2014  . Cough  11/15/2012  . Pharyngitis 09/14/2012  . Fluid overload 09/03/2012  . Ileus following gastrointestinal surgery ([place]) 09/03/2012  . Hypokalemia 08/30/2012  . Small bowel obstruction s/p LOA [id number] 08/21/2012  . Abnormal EKG 08/21/2012  . Chest pain 08/21/2012  . Macular degeneration (senile) of retina 12/15/2011  . Lens replaced 12/15/2011  . Vitreous hemorrhage ([place]) 12/15/2011  . Essential hypertension 08/19/2008  . Regional enteritis of small intestine ([place]) 01/03/2008  . ERECTILE DYSFUNCTION, ORGANIC 01/03/2008  . Sarcoidosis 07/19/2007  . Diabetes mellitus ([place]) 07/19/2007  . HYPERLIPIDEMIA, MIXED 07/19/2007  . CERUMEN IMPACTION, BILATERAL 07/19/2007  . URINARY INCONTINENCE, STRESS, MALE 08/17/2006  . PROSTATE CANCER, HX OF 08/17/2006  . DIABETIC  RETINOPATHY 01/09/2002    Past Surgical History:  Procedure Laterality Date  . CATARACT EXTRACTION W/ INTRAOCULAR LENS  IMPLANT, BILATERAL    . EYE SURGERY Bilateral    "laser OR for diabetic retinopathy"  . INGUINAL HERNIA REPAIR     [name] 07/12/2010), "don't remember which side"  . LAPAROTOMY  08/23/2012   Procedure: EXPLORATORY LAPAROTOMY;  Surgeon: [name], DO;  Location: [facility] ORS;  Service: General;  Laterality: N/A;  exploratory laparotomy with lysis of adhesions  . LYSIS OF ADHESION  08/23/2012   Procedure: LYSIS OF ADHESION;  Surgeon: [name], DO;  Location: [facility];  Service: General;;  . ROBOT ASSISTED LAPAROSCOPIC RADICAL PROSTATECTOMY  2000's   "had to finish manually after machine broke"  . SHOULDER SURGERY  1970's  separation; from playing football"        Home Medications    Prior to Admission medications   Medication Sig Start Date End Date Taking? Authorizing Provider  acetaminophen (TYLENOL) 650 MG CR tablet Take 650 mg by mouth every 8 (eight) hours as needed for pain.   Yes [provider]  aspirin 81 MG chewable tablet Chew 81 mg by mouth every morning.    Yes [provider]   atorvastatin (LIPITOR) 20 MG tablet Take 20 mg by mouth every morning.  09/27/18  Yes [provider]  gabapentin (NEURONTIN) 300 MG capsule TAKE 1 CAPSULE(300 MG) BY MOUTH THREE TIMES DAILY Patient taking differently: Take 300 mg by mouth 3 (three) times daily.  02/12/19  Yes [name], MD  Multiple Vitamin (MULTIVITAMIN WITH MINERALS) TABS tablet Take 1 tablet by mouth every morning. Centrum   Yes [provider]  Polyethyl Glycol-Propyl Glycol (SYSTANE OP) Place 1 drop into both eyes daily as needed (dry eyes).   Yes [provider]  albuterol (VENTOLIN HFA) 108 (90 Base) MCG/ACT inhaler Inhale 2 puffs into the lungs every 4 (four) hours as needed for wheezing or shortness of breath. 05/09/19   Aline August, MD  amLODipine (NORVASC) 5 MG tablet Take 1 tablet (5 mg total) by mouth daily. 05/09/19   Aline August, MD  blood glucose meter kit and supplies KIT Dispense based on patient and insurance preference. Use up to four times daily as directed. (FOR ICD-9 250.00, 250.01). 10/24/18   Domenic Polite, MD  clindamycin (CLEOCIN) 300 MG capsule Take 1 capsule (300 mg total) by mouth 2 (two) times daily for 7 days. 05/10/19 05/17/19  Marzetta Board, DPM  furosemide (LASIX) 40 MG tablet Take 1 tablet (40 mg total) by mouth daily. 05/13/19   Aline August, MD  Insulin Isophane & Regular Human (HUMULIN 70/30 MIX) (70-30) 100 UNIT/ML PEN Inject 20 Units into the skin daily before breakfast AND 16-26 Units daily before supper. 05/09/19   Aline August, MD  Insulin Pen Needle (PEN NEEDLES 31GX5/16") 31G X 8 MM MISC Use new needle with each insulin injection, twice a day. Dx E11.65, Z79.4 04/19/19   [name], MD  Lancets (FREESTYLE) lancets Use as instructed 09/18/14   Delfina Redwood, MD  Lancets 30G MISC  04/06/16   [provider]  mupirocin ointment Drue Stager) 2 % Apply to left foot once daily 05/10/19 05/09/20  Marzetta Board, DPM  ondansetron  (ZOFRAN) 4 MG tablet Take 1 tablet (4 mg total) by mouth daily as needed for nausea or vomiting. 05/09/19   Aline August, MD  polyethylene glycol (MIRALAX / GLYCOLAX) 17 g packet Take 17 g by mouth daily as needed for moderate constipation. 05/09/19   Aline August, MD  senna (SENOKOT) 8.6 MG TABS tablet Take 1 tablet (8.6 mg total) by mouth 2 (two) times daily. 05/09/19   Aline August, MD  traMADol (ULTRAM) 50 MG tablet Take 1 tablet (50 mg total) by mouth every 6 (six) hours as needed for moderate pain (pain). 05/09/19   Aline August, MD    Family History Family History  Problem Relation Age of Onset  . Diabetes Mellitus II Mother   . Colon cancer Neg Hx     Social History Social History   Tobacco Use  . Smoking status: Former Smoker    Packs/day: 2.50    Years: 3.00    Pack years: 7.50    Types: Cigarettes  Quit date: 11/09/1966    Years since quitting: 52.5  . Smokeless tobacco: Never Used  Substance Use Topics  . Alcohol use: Yes    Comment: 04/08/2015 "maybe a beer/ or 2 or a glass of wine monthly"  . Drug use: No     Allergies   Ace inhibitors and Naproxen   Review of Systems Review of Systems  Constitutional: Negative for chills and fever.  HENT: Negative for congestion.   Eyes: Negative for visual disturbance.  Respiratory: Negative for shortness of breath.   Cardiovascular: Negative for chest pain.  Gastrointestinal: Positive for abdominal pain, diarrhea and vomiting.  Genitourinary: Negative for dysuria and flank pain.  Musculoskeletal: Negative for back pain, neck pain and neck stiffness.  Skin: Positive for wound. Negative for rash.  Neurological: Positive for light-headedness. Negative for headaches.     Physical Exam Updated Vital Signs BP (!) 160/61 (BP Location: Right Arm)   Pulse 68   Temp 98.1 F (36.7 C)   Resp 18   Ht '5\' 7"'  (1.702 m)   Wt 80 kg   SpO2 99%   BMI 27.62 kg/m   Physical Exam Vitals signs and nursing note reviewed.   Constitutional:      Appearance: He is well-developed.  HENT:     Head: Normocephalic and atraumatic.  Eyes:     General:        Right eye: No discharge.        Left eye: No discharge.     Conjunctiva/sclera: Conjunctivae normal.  Neck:     Musculoskeletal: Normal range of motion and neck supple.     Trachea: No tracheal deviation.  Cardiovascular:     Rate and Rhythm: Regular rhythm. Bradycardia present.  Pulmonary:     Effort: Pulmonary effort is normal.     Breath sounds: Normal breath sounds.  Abdominal:     General: There is no distension.     Palpations: Abdomen is soft.     Tenderness: There is abdominal tenderness (generlized). There is no guarding.  Skin:    General: Skin is warm.     Findings: No rash.  Neurological:     Mental Status: He is alert and oriented to person, place, and time.      ED Treatments / Results  Labs (all labs ordered are listed, but only abnormal results are displayed) Labs Reviewed  CBC WITH DIFFERENTIAL/PLATELET - Abnormal; Notable for the following components:      Result Value   WBC 3.6 (*)    Hemoglobin 10.9 (*)    HCT 34.8 (*)    MCV 67.7 (*)    MCH 21.2 (*)    RDW 17.7 (*)    All other components within normal limits  COMPREHENSIVE METABOLIC PANEL - Abnormal; Notable for the following components:   Sodium 133 (*)    Glucose, Bld 127 (*)    Creatinine, Ser 1.69 (*)    Calcium 8.8 (*)    Albumin 3.4 (*)    GFR calc non Af Amer 41 (*)    GFR calc Af Amer 47 (*)    All other components within normal limits  CORTISOL-AM, BLOOD - Abnormal; Notable for the following components:   Cortisol - AM 25.6 (*)    All other components within normal limits  CORTISOL-AM, BLOOD - Abnormal; Notable for the following components:   Cortisol - AM 49.1 (*)    All other components within normal limits  BRAIN NATRIURETIC PEPTIDE - Abnormal; Notable for  the following components:   B Natriuretic Peptide 133.6 (*)    All other components within  normal limits  HEMOGLOBIN A1C - Abnormal; Notable for the following components:   Hgb A1c MFr Bld 10.3 (*)    All other components within normal limits  BASIC METABOLIC PANEL - Abnormal; Notable for the following components:   Glucose, Bld 143 (*)    Creatinine, Ser 1.63 (*)    Calcium 8.8 (*)    GFR calc non Af Amer 42 (*)    GFR calc Af Amer 49 (*)    All other components within normal limits  CBC - Abnormal; Notable for the following components:   Hemoglobin 10.5 (*)    HCT 35.2 (*)    MCV 67.7 (*)    MCH 20.2 (*)    MCHC 29.8 (*)    RDW 17.3 (*)    All other components within normal limits  COMPREHENSIVE METABOLIC PANEL - Abnormal; Notable for the following components:   CO2 21 (*)    Creatinine, Ser 1.82 (*)    Total Protein 6.3 (*)    Albumin 3.2 (*)    GFR calc non Af Amer 37 (*)    GFR calc Af Amer 43 (*)    All other components within normal limits  CBC - Abnormal; Notable for the following components:   Hemoglobin 10.5 (*)    HCT 34.8 (*)    MCV 68.0 (*)    MCH 20.5 (*)    RDW 17.4 (*)    All other components within normal limits  GLUCOSE, CAPILLARY - Abnormal; Notable for the following components:   Glucose-Capillary 63 (*)    All other components within normal limits  GLUCOSE, CAPILLARY - Abnormal; Notable for the following components:   Glucose-Capillary 68 (*)    All other components within normal limits  GLUCOSE, CAPILLARY - Abnormal; Notable for the following components:   Glucose-Capillary 117 (*)    All other components within normal limits  GLUCOSE, CAPILLARY - Abnormal; Notable for the following components:   Glucose-Capillary 107 (*)    All other components within normal limits  GLUCOSE, CAPILLARY - Abnormal; Notable for the following components:   Glucose-Capillary 139 (*)    All other components within normal limits  GLUCOSE, CAPILLARY - Abnormal; Notable for the following components:   Glucose-Capillary 148 (*)    All other components within  normal limits  COMPREHENSIVE METABOLIC PANEL - Abnormal; Notable for the following components:   Glucose, Bld 119 (*)    BUN 26 (*)    Creatinine, Ser 2.01 (*)    Calcium 8.5 (*)    Total Protein 5.4 (*)    Albumin 2.7 (*)    GFR calc non Af Amer 33 (*)    GFR calc Af Amer 38 (*)    Anion gap 4 (*)    All other components within normal limits  CBC - Abnormal; Notable for the following components:   Hemoglobin 8.8 (*)    HCT 28.5 (*)    MCV 66.7 (*)    MCH 20.6 (*)    RDW 17.4 (*)    All other components within normal limits  GLUCOSE, CAPILLARY - Abnormal; Notable for the following components:   Glucose-Capillary 163 (*)    All other components within normal limits  GLUCOSE, CAPILLARY - Abnormal; Notable for the following components:   Glucose-Capillary 130 (*)    All other components within normal limits  GLUCOSE, CAPILLARY - Abnormal; Notable for  the following components:   Glucose-Capillary 116 (*)    All other components within normal limits  GLUCOSE, CAPILLARY - Abnormal; Notable for the following components:   Glucose-Capillary 108 (*)    All other components within normal limits  GLUCOSE, CAPILLARY - Abnormal; Notable for the following components:   Glucose-Capillary 111 (*)    All other components within normal limits  COMPREHENSIVE METABOLIC PANEL - Abnormal; Notable for the following components:   Glucose, Bld 233 (*)    BUN 30 (*)    Creatinine, Ser 2.16 (*)    Calcium 8.3 (*)    Total Protein 5.4 (*)    Albumin 2.6 (*)    GFR calc non Af Amer 30 (*)    GFR calc Af Amer 35 (*)    All other components within normal limits  CBC - Abnormal; Notable for the following components:   WBC 3.2 (*)    RBC 4.15 (*)    Hemoglobin 8.5 (*)    HCT 28.1 (*)    MCV 67.7 (*)    MCH 20.5 (*)    RDW 17.2 (*)    Platelets 148 (*)    All other components within normal limits  GLUCOSE, CAPILLARY - Abnormal; Notable for the following components:   Glucose-Capillary 119 (*)     All other components within normal limits  GLUCOSE, CAPILLARY - Abnormal; Notable for the following components:   Glucose-Capillary 211 (*)    All other components within normal limits  GLUCOSE, CAPILLARY - Abnormal; Notable for the following components:   Glucose-Capillary 199 (*)    All other components within normal limits  GLUCOSE, CAPILLARY - Abnormal; Notable for the following components:   Glucose-Capillary 136 (*)    All other components within normal limits  GLUCOSE, CAPILLARY - Abnormal; Notable for the following components:   Glucose-Capillary 143 (*)    All other components within normal limits  CBG MONITORING, ED - Abnormal; Notable for the following components:   Glucose-Capillary 156 (*)    All other components within normal limits  SARS CORONAVIRUS 2 (TAT 6-24 HRS)  CULTURE, BLOOD (ROUTINE X 2)  CULTURE, BLOOD (ROUTINE X 2)  MAGNESIUM  LIPASE, BLOOD  LIPID PANEL  TSH  PROTIME-INR  APTT  GLUCOSE, CAPILLARY  GLUCOSE, CAPILLARY  CORTISOL  CBG MONITORING, ED  TYPE AND SCREEN  ABO/RH  TROPONIN I (HIGH SENSITIVITY)  TROPONIN I (HIGH SENSITIVITY)    EKG EKG Interpretation  Date/Time:  Monday May 06 2019 09:32:55 EDT Ventricular Rate:  83 PR Interval:    QRS Duration: 95 QT Interval:  379 QTC Calculation: 446 R Axis:   52 Text Interpretation:  Sinus rhythm Prolonged PR interval Probable LVH with secondary repol abnrm Confirmed by Randal Buba, April ([id number]) on 05/07/2019 8:03:15 AM   Radiology No results found.  Procedures .Critical Care Performed by: Elnora Morrison, MD Authorized by: Elnora Morrison, MD   Critical care provider statement:    Critical care time (minutes):  40   Critical care start time:  05/05/2019 10:30 PM   Critical care end time:  05/05/2019 11:05 PM   Critical care time was exclusive of:  Separately billable procedures and treating other patients and teaching time   Critical care was necessary to treat or prevent imminent or  life-threatening deterioration of the following conditions:  Cardiac failure   Critical care was time spent personally by me on the following activities:  Discussions with consultants, evaluation of patient's response to treatment, examination of patient, ordering  and performing treatments and interventions, ordering and review of laboratory studies, ordering and review of radiographic studies, pulse oximetry, re-evaluation of patient's condition, obtaining history from patient or surrogate and review of old charts Comments:     Heart block complete   (including critical care time)  Medications Ordered in ED Medications  sodium chloride 0.9 % bolus 500 mL (0 mLs Intravenous Stopped 05/06/19 0003)  iohexol (OMNIPAQUE) 300 MG/ML solution 100 mL (100 mLs Intravenous Contrast Given 05/05/19 2300)  morphine 4 MG/ML injection 4 mg (4 mg Intravenous Given 05/05/19 2335)  morphine 4 MG/ML injection 4 mg (4 mg Intravenous Given 05/06/19 0225)  diatrizoate meglumine-sodium (GASTROGRAFIN) 66-10 % solution 90 mL (90 mLs Per NG tube Given 05/06/19 1118)  dextrose 50 % solution 25 mL (25 mLs Intravenous Given 05/06/19 2045)  ipratropium-albuterol (DUONEB) 0.5-2.5 (3) MG/3ML nebulizer solution 3 mL (3 mLs Nebulization Given 05/09/19 0909)     Initial Impression / Assessment and Plan / ED Course  I have reviewed the triage vital signs and the nursing notes.  Pertinent labs & imaging results that were available during my care of the patient were reviewed by me and considered in my medical decision making (see chart for details).  Clinical Course as of May 12 910  Mon May 06, 2019  0313 DG Chest Portable 1 View [WF]    Clinical Course User Index [WF] Tedd Sias, [place]      Patient presents with worsening abdominal pain vomiting since eating earlier today concern clinically for gastroenteritis versus bowel obstruction versus other pathology.  Blood work reviewed hemoglobin 10.9, white blood cell count  3.6, lipase pending.  Repeat pain meds required for abdominal pain.  Nursing called myself into the room heart rate around 40 regular.  EKG reviewed consistent with complete heart block.  Patient has mild lightheadedness, normal/elevated blood pressure.  Cardiology consulted and agrees with heart block and seeing the patient in the ER.  Patient care be signed out to follow-up CT scan and discussed with cardiology for admission.   Final Clinical Impressions(s) / ED Diagnoses   Final diagnoses:  Abdominal pain, generalized  Vomiting in adult  Complete heart block (HCC)  Nasogastric tube present    ED Discharge Orders         Ordered    furosemide (LASIX) 40 MG tablet  Daily     05/09/19 1048    traMADol (ULTRAM) 50 MG tablet  Every 6 hours PRN     05/09/19 1048    ondansetron (ZOFRAN) 4 MG tablet  Daily PRN     05/09/19 1048    Insulin Isophane & Regular Human (HUMULIN 70/30 MIX) (70-30) 100 UNIT/ML PEN     05/09/19 1048    amLODipine (NORVASC) 5 MG tablet  Daily     05/09/19 1048    albuterol (VENTOLIN HFA) 108 (90 Base) MCG/ACT inhaler  Every 4 hours PRN     05/09/19 1048    senna (SENOKOT) 8.6 MG TABS tablet  2 times daily     05/09/19 1048    Increase activity slowly     05/09/19 1048    Diet - low sodium heart healthy     05/09/19 1048    Diet Carb Modified     05/09/19 1048    polyethylene glycol (MIRALAX / GLYCOLAX) 17 g packet  Daily PRN     05/09/19 1052           Elnora Morrison, MD 05/05/19 2335  Elnora Morrison, MD 05/13/19 [phone]

## 2019-05-05 NOTE — ED Triage Notes (Signed)
Patient came via [facility], complaining of abdominal pain and vomiting after eating yogurt. Per EMS, found to have 2nd degree AV BLOCK, HR right now fluctuating between 50-60' BPM.

## 2019-05-05 NOTE — ED Notes (Addendum)
HR fluctuating right now between 30-40's BPM, Dr. Reather Converse aware and present in the room.

## 2019-05-06 ENCOUNTER — Inpatient Hospital Stay (HOSPITAL_COMMUNITY): Payer: Medicare Other

## 2019-05-06 ENCOUNTER — Encounter (HOSPITAL_COMMUNITY): Payer: Self-pay

## 2019-05-06 ENCOUNTER — Telehealth (INDEPENDENT_AMBULATORY_CARE_PROVIDER_SITE_OTHER): Payer: Self-pay | Admitting: Podiatry

## 2019-05-06 ENCOUNTER — Telehealth: Payer: Self-pay | Admitting: Podiatry

## 2019-05-06 DIAGNOSIS — E11628 Type 2 diabetes mellitus with other skin complications: Secondary | ICD-10-CM | POA: Diagnosis present

## 2019-05-06 DIAGNOSIS — E1165 Type 2 diabetes mellitus with hyperglycemia: Secondary | ICD-10-CM | POA: Diagnosis present

## 2019-05-06 DIAGNOSIS — E11621 Type 2 diabetes mellitus with foot ulcer: Secondary | ICD-10-CM | POA: Diagnosis present

## 2019-05-06 DIAGNOSIS — E11610 Type 2 diabetes mellitus with diabetic neuropathic arthropathy: Secondary | ICD-10-CM

## 2019-05-06 DIAGNOSIS — E11319 Type 2 diabetes mellitus with unspecified diabetic retinopathy without macular edema: Secondary | ICD-10-CM | POA: Diagnosis present

## 2019-05-06 DIAGNOSIS — I442 Atrioventricular block, complete: Secondary | ICD-10-CM

## 2019-05-06 DIAGNOSIS — R0602 Shortness of breath: Secondary | ICD-10-CM | POA: Diagnosis not present

## 2019-05-06 DIAGNOSIS — R001 Bradycardia, unspecified: Secondary | ICD-10-CM | POA: Diagnosis present

## 2019-05-06 DIAGNOSIS — K219 Gastro-esophageal reflux disease without esophagitis: Secondary | ICD-10-CM | POA: Diagnosis present

## 2019-05-06 DIAGNOSIS — M7989 Other specified soft tissue disorders: Secondary | ICD-10-CM | POA: Diagnosis not present

## 2019-05-06 DIAGNOSIS — N183 Chronic kidney disease, stage 3 (moderate): Secondary | ICD-10-CM

## 2019-05-06 DIAGNOSIS — E08621 Diabetes mellitus due to underlying condition with foot ulcer: Secondary | ICD-10-CM | POA: Diagnosis not present

## 2019-05-06 DIAGNOSIS — M14672 Charcot's joint, left ankle and foot: Secondary | ICD-10-CM | POA: Diagnosis not present

## 2019-05-06 DIAGNOSIS — I083 Combined rheumatic disorders of mitral, aortic and tricuspid valves: Secondary | ICD-10-CM | POA: Diagnosis present

## 2019-05-06 DIAGNOSIS — E1142 Type 2 diabetes mellitus with diabetic polyneuropathy: Secondary | ICD-10-CM

## 2019-05-06 DIAGNOSIS — R52 Pain, unspecified: Secondary | ICD-10-CM | POA: Diagnosis not present

## 2019-05-06 DIAGNOSIS — D509 Iron deficiency anemia, unspecified: Secondary | ICD-10-CM | POA: Diagnosis present

## 2019-05-06 DIAGNOSIS — E1122 Type 2 diabetes mellitus with diabetic chronic kidney disease: Secondary | ICD-10-CM | POA: Diagnosis present

## 2019-05-06 DIAGNOSIS — R112 Nausea with vomiting, unspecified: Secondary | ICD-10-CM | POA: Diagnosis present

## 2019-05-06 DIAGNOSIS — H547 Unspecified visual loss: Secondary | ICD-10-CM | POA: Diagnosis present

## 2019-05-06 DIAGNOSIS — E2740 Unspecified adrenocortical insufficiency: Secondary | ICD-10-CM | POA: Diagnosis present

## 2019-05-06 DIAGNOSIS — E782 Mixed hyperlipidemia: Secondary | ICD-10-CM

## 2019-05-06 DIAGNOSIS — R9439 Abnormal result of other cardiovascular function study: Secondary | ICD-10-CM | POA: Diagnosis present

## 2019-05-06 DIAGNOSIS — K565 Intestinal adhesions [bands], unspecified as to partial versus complete obstruction: Secondary | ICD-10-CM | POA: Diagnosis present

## 2019-05-06 DIAGNOSIS — D860 Sarcoidosis of lung: Secondary | ICD-10-CM | POA: Diagnosis present

## 2019-05-06 DIAGNOSIS — Z20828 Contact with and (suspected) exposure to other viral communicable diseases: Secondary | ICD-10-CM | POA: Diagnosis present

## 2019-05-06 DIAGNOSIS — L97521 Non-pressure chronic ulcer of other part of left foot limited to breakdown of skin: Secondary | ICD-10-CM | POA: Diagnosis not present

## 2019-05-06 DIAGNOSIS — L089 Local infection of the skin and subcutaneous tissue, unspecified: Secondary | ICD-10-CM | POA: Diagnosis present

## 2019-05-06 DIAGNOSIS — K56609 Unspecified intestinal obstruction, unspecified as to partial versus complete obstruction: Secondary | ICD-10-CM | POA: Diagnosis not present

## 2019-05-06 DIAGNOSIS — F419 Anxiety disorder, unspecified: Secondary | ICD-10-CM | POA: Diagnosis present

## 2019-05-06 DIAGNOSIS — L97529 Non-pressure chronic ulcer of other part of left foot with unspecified severity: Secondary | ICD-10-CM | POA: Diagnosis present

## 2019-05-06 DIAGNOSIS — L97422 Non-pressure chronic ulcer of left heel and midfoot with fat layer exposed: Secondary | ICD-10-CM

## 2019-05-06 DIAGNOSIS — E785 Hyperlipidemia, unspecified: Secondary | ICD-10-CM | POA: Diagnosis present

## 2019-05-06 DIAGNOSIS — I10 Essential (primary) hypertension: Secondary | ICD-10-CM | POA: Diagnosis not present

## 2019-05-06 DIAGNOSIS — I129 Hypertensive chronic kidney disease with stage 1 through stage 4 chronic kidney disease, or unspecified chronic kidney disease: Secondary | ICD-10-CM | POA: Diagnosis present

## 2019-05-06 DIAGNOSIS — E11649 Type 2 diabetes mellitus with hypoglycemia without coma: Secondary | ICD-10-CM | POA: Diagnosis not present

## 2019-05-06 LAB — CBC
HCT: 35.2 % — ABNORMAL LOW (ref 39.0–52.0)
Hemoglobin: 10.5 g/dL — ABNORMAL LOW (ref 13.0–17.0)
MCH: 20.2 pg — ABNORMAL LOW (ref 26.0–34.0)
MCHC: 29.8 g/dL — ABNORMAL LOW (ref 30.0–36.0)
MCV: 67.7 fL — ABNORMAL LOW (ref 80.0–100.0)
Platelets: 210 10*3/uL (ref 150–400)
RBC: 5.2 MIL/uL (ref 4.22–5.81)
RDW: 17.3 % — ABNORMAL HIGH (ref 11.5–15.5)
WBC: 4.7 10*3/uL (ref 4.0–10.5)
nRBC: 0 % (ref 0.0–0.2)

## 2019-05-06 LAB — GLUCOSE, CAPILLARY
Glucose-Capillary: 63 mg/dL — ABNORMAL LOW (ref 70–99)
Glucose-Capillary: 68 mg/dL — ABNORMAL LOW (ref 70–99)
Glucose-Capillary: 117 mg/dL — ABNORMAL HIGH (ref 70–99)

## 2019-05-06 LAB — BASIC METABOLIC PANEL
Anion gap: 9 (ref 5–15)
BUN: 20 mg/dL (ref 8–23)
CO2: 24 mmol/L (ref 22–32)
Calcium: 8.8 mg/dL — ABNORMAL LOW (ref 8.9–10.3)
Chloride: 103 mmol/L (ref 98–111)
Creatinine, Ser: 1.63 mg/dL — ABNORMAL HIGH (ref 0.61–1.24)
GFR calc Af Amer: 49 mL/min — ABNORMAL LOW (ref >60)
GFR calc non Af Amer: 42 mL/min — ABNORMAL LOW (ref >60)
Glucose, Bld: 143 mg/dL — ABNORMAL HIGH (ref 70–99)
Potassium: 4.8 mmol/L (ref 3.5–5.1)
Sodium: 136 mmol/L (ref 135–145)

## 2019-05-06 LAB — TYPE AND SCREEN
ABO/RH(D): O POS
Antibody Screen: NEGATIVE

## 2019-05-06 LAB — HEMOGLOBIN A1C
Hgb A1c MFr Bld: 10.3 % — ABNORMAL HIGH (ref 4.8–5.6)
Mean Plasma Glucose: 248.91 mg/dL

## 2019-05-06 LAB — APTT: aPTT: 30 seconds (ref 24–36)

## 2019-05-06 LAB — LIPID PANEL
Cholesterol: 136 mg/dL (ref 0–200)
HDL: 53 mg/dL (ref >40)
LDL Cholesterol: 74 mg/dL (ref 0–99)
Total CHOL/HDL Ratio: 2.6 RATIO
Triglycerides: 47 mg/dL (ref <150)
VLDL: 9 mg/dL (ref 0–40)

## 2019-05-06 LAB — TROPONIN I (HIGH SENSITIVITY)
Troponin I (High Sensitivity): 9 ng/L (ref <18)
Troponin I (High Sensitivity): 12 ng/L (ref <18)

## 2019-05-06 LAB — ECHOCARDIOGRAM COMPLETE
Height: 67 in
Weight: 2821.89 oz

## 2019-05-06 LAB — BRAIN NATRIURETIC PEPTIDE: B Natriuretic Peptide: 133.6 pg/mL — ABNORMAL HIGH (ref 0.0–100.0)

## 2019-05-06 LAB — TSH: TSH: 1.37 u[IU]/mL (ref 0.350–4.500)

## 2019-05-06 LAB — PROTIME-INR
INR: 1.2 (ref 0.8–1.2)
Prothrombin Time: 14.7 seconds (ref 11.4–15.2)

## 2019-05-06 LAB — CBG MONITORING, ED
Glucose-Capillary: 156 mg/dL — ABNORMAL HIGH (ref 70–99)
Glucose-Capillary: 91 mg/dL (ref 70–99)

## 2019-05-06 LAB — CORTISOL-AM, BLOOD
Cortisol - AM: 25.6 ug/dL — ABNORMAL HIGH (ref 6.7–22.6)
Cortisol - AM: 49.1 ug/dL — ABNORMAL HIGH (ref 6.7–22.6)

## 2019-05-06 LAB — SARS CORONAVIRUS 2 (TAT 6-24 HRS): SARS Coronavirus 2: NEGATIVE

## 2019-05-06 LAB — ABO/RH: ABO/RH(D): O POS

## 2019-05-06 MED ORDER — POLYETHYL GLYCOL-PROPYL GLYCOL 0.4-0.3 % OP GEL: Freq: Every day | OPHTHALMIC | Status: DC | PRN

## 2019-05-06 MED ORDER — HYDROCORTISONE NA SUCCINATE PF 100 MG IJ SOLR
50.0000 mg | Freq: Two times a day (BID) | INTRAMUSCULAR | Status: DC
  Administered 2019-05-06: 50 mg via INTRAVENOUS
  Filled 2019-05-06: qty 2

## 2019-05-06 MED ORDER — ACETAMINOPHEN 325 MG PO TABS: 650.0000 mg | ORAL_TABLET | Freq: Four times a day (QID) | ORAL | Status: DC | PRN

## 2019-05-06 MED ORDER — DEXTROSE 50 % IV SOLN
25.0000 mL | Freq: Once | INTRAVENOUS | Status: AC
  Administered 2019-05-06: 25 mL via INTRAVENOUS
  Filled 2019-05-06: qty 50

## 2019-05-06 MED ORDER — MORPHINE SULFATE (PF) 4 MG/ML IV SOLN
4.0000 mg | Freq: Once | INTRAVENOUS | Status: AC
  Administered 2019-05-06: 4 mg via INTRAVENOUS
  Filled 2019-05-06: qty 1

## 2019-05-06 MED ORDER — ONDANSETRON HCL 4 MG/2ML IJ SOLN: 4.0000 mg | Freq: Three times a day (TID) | INTRAMUSCULAR | Status: DC | PRN

## 2019-05-06 MED ORDER — HYDRALAZINE HCL 20 MG/ML IJ SOLN
5.0000 mg | INTRAMUSCULAR | Status: DC | PRN
  Administered 2019-05-07: 5 mg via INTRAVENOUS
  Filled 2019-05-06: qty 1

## 2019-05-06 MED ORDER — HYDROCODONE-ACETAMINOPHEN 5-325 MG PO TABS
1.0000 | ORAL_TABLET | Freq: Four times a day (QID) | ORAL | Status: DC | PRN
  Administered 2019-05-07 – 2019-05-09 (×5): 1 via ORAL
  Filled 2019-05-06 (×5): qty 1

## 2019-05-06 MED ORDER — INSULIN ASPART PROT & ASPART (70-30 MIX) 100 UNIT/ML ~~LOC~~ SUSP
8.0000 [IU] | Freq: Two times a day (BID) | SUBCUTANEOUS | Status: DC
  Administered 2019-05-06: 8 [IU] via SUBCUTANEOUS
  Filled 2019-05-06 (×2): qty 10

## 2019-05-06 MED ORDER — HYDROCORTISONE NA SUCCINATE PF 100 MG IJ SOLR
50.0000 mg | Freq: Every day | INTRAMUSCULAR | Status: DC
  Administered 2019-05-07: 50 mg via INTRAVENOUS
  Filled 2019-05-06: qty 2

## 2019-05-06 MED ORDER — DIATRIZOATE MEGLUMINE & SODIUM 66-10 % PO SOLN
90.0000 mL | Freq: Once | ORAL | Status: AC
  Administered 2019-05-06: 90 mL via NASOGASTRIC
  Filled 2019-05-06: qty 90

## 2019-05-06 MED ORDER — ATORVASTATIN CALCIUM 10 MG PO TABS
20.0000 mg | ORAL_TABLET | Freq: Every morning | ORAL | Status: DC
  Administered 2019-05-07 – 2019-05-09 (×3): 20 mg via ORAL
  Filled 2019-05-06 (×3): qty 2

## 2019-05-06 MED ORDER — POLYVINYL ALCOHOL 1.4 % OP SOLN: 1.0000 [drp] | Freq: Every day | OPHTHALMIC | Status: DC | PRN

## 2019-05-06 MED ORDER — ATROPINE SULFATE 1 MG/10ML IJ SOSY: 0.5000 mg | PREFILLED_SYRINGE | INTRAMUSCULAR | Status: DC | PRN

## 2019-05-06 MED ORDER — SODIUM CHLORIDE 0.9 % IV SOLN
INTRAVENOUS | Status: DC
  Administered 2019-05-06 (×2): via INTRAVENOUS

## 2019-05-06 MED ORDER — ASPIRIN 81 MG PO CHEW
81.0000 mg | CHEWABLE_TABLET | Freq: Every morning | ORAL | Status: DC
  Administered 2019-05-07 – 2019-05-09 (×3): 81 mg via ORAL
  Filled 2019-05-06 (×3): qty 1

## 2019-05-06 MED ORDER — TRAMADOL HCL 50 MG PO TABS: 50.0000 mg | ORAL_TABLET | Freq: Four times a day (QID) | ORAL | Status: DC | PRN

## 2019-05-06 MED ORDER — INSULIN ASPART 100 UNIT/ML ~~LOC~~ SOLN
0.0000 [IU] | Freq: Three times a day (TID) | SUBCUTANEOUS | Status: DC
  Administered 2019-05-06: 2 [IU] via SUBCUTANEOUS
  Administered 2019-05-07 – 2019-05-09 (×3): 1 [IU] via SUBCUTANEOUS
  Administered 2019-05-09: 2 [IU] via SUBCUTANEOUS

## 2019-05-06 MED ORDER — MORPHINE SULFATE (PF) 2 MG/ML IV SOLN
1.0000 mg | INTRAVENOUS | Status: DC | PRN
  Administered 2019-05-06 – 2019-05-08 (×4): 1 mg via INTRAVENOUS
  Filled 2019-05-06 (×4): qty 1

## 2019-05-06 MED ORDER — ACETAMINOPHEN 650 MG RE SUPP: 650.0000 mg | Freq: Four times a day (QID) | RECTAL | Status: DC | PRN

## 2019-05-06 MED ORDER — SODIUM CHLORIDE 0.9 % IV SOLN
100.0000 mg | Freq: Two times a day (BID) | INTRAVENOUS | Status: DC
  Administered 2019-05-06 – 2019-05-08 (×5): 100 mg via INTRAVENOUS
  Filled 2019-05-06 (×8): qty 100

## 2019-05-06 MED ORDER — DOXYCYCLINE HYCLATE 100 MG IV SOLR: 200.0000 mg | Freq: Two times a day (BID) | INTRAVENOUS | Status: DC

## 2019-05-06 MED ORDER — GABAPENTIN 300 MG PO CAPS
300.0000 mg | ORAL_CAPSULE | Freq: Three times a day (TID) | ORAL | Status: DC
  Administered 2019-05-07 – 2019-05-09 (×8): 300 mg via ORAL
  Filled 2019-05-06 (×10): qty 1

## 2019-05-06 NOTE — Telephone Encounter (Signed)
Patient called to let us know he was admitted to hospital. He would still like to be followed by Podiatry for his left foot ulceration. Instructed him to make his wishes known to his admitting doctor who can request Podiatry Consult for his foot. He thanked me for the information and returning his call.

## 2019-05-06 NOTE — Telephone Encounter (Signed)
Have him to follow up after he is out of the hospital. He already has an appt for 10/6  -Dr. Chauncey Cruel

## 2019-05-06 NOTE — ED Notes (Signed)
BS is 156

## 2019-05-06 NOTE — Telephone Encounter (Signed)
Left message informing pt Dr. [name] and Dr. Elisha Ponder would like him to make an appt with our office once he is out of the hospital.

## 2019-05-06 NOTE — Progress Notes (Signed)
  Echocardiogram 2D Echocardiogram has been performed.  [name] 05/06/2019, 10:40 AM

## 2019-05-06 NOTE — Consult Note (Signed)
Cardiology Consult    Patient ID: [name] MRN: [id number], DOB/AGE: 1950/12/26   Admit date: 05/05/2019 Date of Consult: 05/06/2019  Primary Physician: [name] Guys, MD Primary Cardiologist: [name] Field, MD Requesting Provider:   Patient Profile    [name] is a 68 y.o. male with a history of diabetes, hypertension, CKD III, prostate cancer, and small bowel obstruction. He also carries a diagnosis of pulmonary sarcoidosis from 2007, though this seems to be based on chest imaging alone. He is being seen today for the evaluation of bradycardia.   History of Present Illness    He has a history of small bowel obstruction and presented for evaluation of lower abdominal pain and nausea/vomiting. He was found to have a bowel obstruction and is being admitted to the hospitalist service. Incidentally, on arrival to the ED he was found to be bradycardia with evidence of heart block on ECG. He has not been diagnosed with any arrhythmia in the past. He has been evaluated for exertional dyspnea this year at [facility] and underwent nuclear stress testing read as showing a small lateral reversible defect of mild severity. He says he really only gets short of breath when he carries water up stairs and this happens intermittently. He has occasional episodes of dizziness but no syncope. He thinks this may be related to hypoglycemia as it seems to happen when he takes his insulin but does eat. He doesn't have a functional glucometer however to verify this. He never has any chest pain, and swelling only in he left leg that is chronic - he is being treated for a diabetic foot ulcer. He has remained hypertensive while in the ED. He has a prescription for Robaxin with no refills but says he never uses it.   Past Medical History   Past Medical History:  Diagnosis Date   Cellulitis and abscess of face 10/11/2007   Qualifier: Diagnosis of  By: Amil Amen MD, Elizabeth     Diabetic retinopathy  [facility])    Diabetic retinopathy associated with type 2 diabetes mellitus ([place]) 09/13/2014   He works for [employer] for the [employer] overload 09/03/2012   Post op   GERD (gastroesophageal reflux disease)    Prostate cancer ([place])    Visual impairment     Past Surgical History:  Procedure Laterality Date   CATARACT EXTRACTION W/ INTRAOCULAR LENS  IMPLANT, BILATERAL     EYE SURGERY Bilateral    "laser OR for diabetic retinopathy"   INGUINAL HERNIA REPAIR     [name] 07/12/2010), "don't remember which side"   LAPAROTOMY  08/23/2012   Procedure: EXPLORATORY LAPAROTOMY;  Surgeon: [name], DO;  Location: [facility] ORS;  Service: General;  Laterality: N/A;  exploratory laparotomy with lysis of adhesions   LYSIS OF ADHESION  08/23/2012   Procedure: LYSIS OF ADHESION;  Surgeon: [name], DO;  Location: [facility] ORS;  Service: General;;   ROBOT ASSISTED LAPAROSCOPIC RADICAL PROSTATECTOMY  2000's   "had to finish manually after machine broke"   SHOULDER SURGERY  1970's   separation; from playing football"     Allergies  Allergen Reactions   Ace Inhibitors Itching and Cough   Naproxen Anaphylaxis, Shortness Of Breath and Other (See Comments)    Throat swells. cannot breathe, and causes GI distress   Inpatient Medications     hydrocortisone sod succinate (SOLU-CORTEF) inj  50 mg Intravenous Q12H    Family History    Family History  Problem Relation Age  of Onset   Diabetes Mellitus II Mother    Colon cancer Neg Hx    He indicated that his mother is alive. He indicated that the status of his neg hx is unknown.   Social History    Social History   Socioeconomic History   Marital status: Married    Spouse name: Not on file   Number of children: Not on file   Years of education: Not on file   Highest education level: Not on file  Occupational History   Not on file  Social Needs   Financial resource strain: Not on file   Food insecurity    Worry: Not on file     Inability: Not on file   Transportation needs    Medical: Not on file    Non-medical: Not on file  Tobacco Use   Smoking status: Former Smoker    Packs/day: 2.50    Years: 3.00    Pack years: 7.50    Types: Cigarettes    Quit date: 11/09/1966    Years since quitting: 52.5   Smokeless tobacco: Never Used  Substance and Sexual Activity   Alcohol use: Yes    Comment: 04/08/2015 "maybe a beer/ or 2 or a glass of wine monthly"   Drug use: No   Sexual activity: Yes  Lifestyle   Physical activity    Days per week: Not on file    Minutes per session: Not on file   Stress: Not on file  Relationships   Social connections    Talks on phone: Not on file    Gets together: Not on file    Attends religious service: Not on file    Active member of club or organization: Not on file    Attends meetings of clubs or organizations: Not on file    Relationship status: Not on file   Intimate partner violence    Fear of current or ex partner: Not on file    Emotionally abused: Not on file    Physically abused: Not on file    Forced sexual activity: Not on file  Other Topics Concern   Not on file  Social History Narrative   Not on file     Review of Systems    General:  No chills, fever, night sweats or weight changes. +fatigue Cardiovascular:  See HPI Dermatological: No rash, lesions/masses Respiratory: No cough. +Dyspnea with exertion at times. Urologic: No hematuria, dysuria Abdominal:   +nausea, vomiting, abdominal pain. No hematochezia or hematemesis.  Neurologic:  No visual changes, lateralizing weakness, changes in mental status. All other systems reviewed and are otherwise negative except as noted above.  Physical Exam    Blood pressure (!) 188/79, pulse 81, temperature 98.3 F (36.8 C), temperature source Oral, resp. rate 20, height 5\' 7"  (1.702 m), weight 80 kg, SpO2 100 %.     Intake/Output Summary (Last 24 hours) at 05/06/2019 0229 Last data filed at  05/06/2019 0003 Gross per 24 hour  Intake 500 ml  Output --  Net 500 ml   Wt Readings from Last 3 Encounters:  05/05/19 80 kg  04/26/19 82.9 kg  04/19/19 82.1 kg   CONSTITUTIONAL: alert and conversant, in no distress HEENT: conjunctiva normal, EOM intact, pupils equal. CARDIOVASCULAR: Regular rhythm. +systolic murmur No G2/6. Normal S1/S2. Radial pulses intact. No JVD.  PULMONARY/CHEST WALL: no deformities, normal breath sounds bilaterally, normal work of breathing ABDOMINAL:mildly distended, tender to palpitation, no peritoneal signs EXTREMITIES: LLE boot in  place from diabetic ulcer, 1-2+ LLE edema, no RLE edema. warm and well-perfused NEUROLOGIC: alert, no abnormal movements, cranial nerves grossly intact.   Labs    Lab Results  Component Value Date   WBC 3.6 (L) 05/05/2019   HGB 10.9 (L) 05/05/2019   HCT 34.8 (L) 05/05/2019   MCV 67.7 (L) 05/05/2019   PLT 202 05/05/2019    Recent Labs  Lab 05/05/19 2130  NA 133*  K 4.2  CL 102  CO2 23  BUN 23  CREATININE 1.69*  CALCIUM 8.8*  PROT 6.9  BILITOT 1.1  ALKPHOS 77  ALT 28  AST 33  GLUCOSE 127*   Lab Results  Component Value Date   CHOL 158 04/19/2019   HDL 53 04/19/2019   LDLCALC 63 02/07/2019   TRIG 96 04/19/2019   No results found for: [facility]   Radiology Studies    Ct Abdomen Pelvis W Contrast  Result Date: 05/05/2019 CLINICAL DATA:  Initial evaluation for acute nausea, vomiting, abdominal pain. History of previous SBO. EXAM: CT ABDOMEN AND PELVIS WITH CONTRAST TECHNIQUE: Multidetector CT imaging of the abdomen and pelvis was performed using the standard protocol following bolus administration of intravenous contrast. CONTRAST:  141mL OMNIPAQUE IOHEXOL 300 MG/ML  SOLN COMPARISON:  Prior CT from 12/29/2016. FINDINGS: Lower chest: Mild subsegmental atelectatic changes noted within the visualized lung bases. 9 mm nodular density along the left hemidiaphragm is little interval change as compared to previous  study from 2018, most likely a benign finding. Visualized lungs are otherwise clear. Hepatobiliary: Liver demonstrates a normal contrast enhanced appearance. Gallbladder within normal limits. No biliary dilatation. Pancreas: Subcentimeter cystic lesion within the mid pancreatic body noted, grossly stable from previous, likely benign. Pancreas otherwise unremarkable. No pancreatic ductal dilatation or acute peripancreatic inflammation. Spleen: Spleen within normal limits. Adrenals/Urinary Tract: Adrenal glands are normal. Kidneys equal in size with symmetric enhancement. 18 mm cyst noted at the upper pole of the right kidney, similar to previous. No nephrolithiasis, hydronephrosis, or focal enhancing renal mass. No visible hydroureter. Partially distended bladder within normal limits. Stomach/Bowel: Stomach within normal limits. Multiple mildly dilated fluid-filled loops of bowel seen clustered within the upper left and mid abdomen. Focal transition point within the mid abdomen present (series 3, image 28). Small bowel is decompressed distally. Finding consistent with acute mechanical small bowel obstruction. Underlying adhesive disease is suspected. No associated pneumatosis or portal venous gas. No other acute inflammatory changes about the small bowel. Moderate retained stool seen diffusely throughout the colon, suggesting constipation. Appendix within normal limits. Vascular/Lymphatic: Normal intravascular enhancement seen throughout the intra-abdominal aorta. Mesenteric vessels patent proximally. Mild aorto bi-iliac atherosclerotic disease. No pathologically enlarged intra-abdominopelvic lymph nodes. Mildly prominent bilateral inguinal lymph nodes measure up to 13 mm in short axis, within normal limits. Reproductive: Prior prostatectomy. Other: No free air or fluid. Musculoskeletal: No acute osseous abnormality. No discrete lytic or blastic osseous lesions. Moderate degenerative spondylolysis present at L3-4  through L5-S1. Osteoarthritic changes noted about the hips bilaterally. IMPRESSION: 1. Findings consistent with acute mechanical small bowel obstruction with transition point within the mid abdomen as above. 2. No other acute intra-abdominal or pelvic process. 3. Moderate retained stool diffusely throughout the colon, suggesting constipation. 4. Prior prostatectomy. Electronically Signed   By: Jeannine Boga M.D.   On: 05/05/2019 23:33   Dg Chest Portable 1 View  Result Date: 05/05/2019 CLINICAL DATA:  Abdominal pain. Vomiting. EXAM: PORTABLE CHEST 1 VIEW COMPARISON:  10/19/2018 FINDINGS: Borderline cardiomegaly with unchanged mediastinal contours.  Pulmonary vasculature is normal. No consolidation, pleural effusion, or pneumothorax. No acute osseous abnormalities are seen. IMPRESSION: Borderline cardiomegaly.  No acute pulmonary process. Electronically Signed   By: Keith Rake M.D.   On: 05/05/2019 22:16   Dg Foot Complete Left  Result Date: 04/30/2019 Please see detailed radiograph report in office note.   ECG & Cardiac Imaging   Lexiscan SPECT MPI 10/09/2018: There is a small sized mild intensity reversible defect of the lateral wall suggestive of ischemia. Fixed inferior defect with no associated wall motion abnormality consistent with diaphragmatic attenuation artifact. Normal LV function with a calculated EF of 63 %.  TTE 10/05/2018 The left ventricular size is normal. Left ventricular systolic function is normal. LV ejection fraction = 60-65%. Left ventricular filling pattern is prolonged relaxation. The left ventricular wall motion is normal. The right ventricle is normal in size and function. Diffuse thickening of the aortic valve with preserved cusp opening. There is mild aortic regurgitation. The aortic sinus is normal size. There is no pericardial effusion. There is no comparison study available.  ECG shows NSR with complete heart block, junctional escape at 39 bpm,  QRS 93 ms - personally reviewed.  Assessment & Plan    High Grade AV Block: he has long-standing AV node conduction disease with stable first degree AV block, but no evidence of distal conduction disease on ECG. His rhythm mostly shows the same, as he initially had AV Wenckebach before this progressed to complete heart block for several minutes, which was captured on ECG. He has had episodic dizziness without syncope, and exertional intolerance at times with stairs - unclear if these were related. His stress test overall was fairly low risk but was suggestive of ischemia, unlikely though to explain the dizziness. The basis for his sarcoid diagnosis is questionable and CXR today does not show any evidence of active disease. He is not on any AV nodal blockade and electrolytes are normal. He does probably warrant pacemaker placement at this point.  - Will discuss PPM with EP in the morning - his bowel obstruction needs to be addressed first. - Atropine prn, can add dopamine infusion if needed for sustained bradycardia - Keep pacing pads in place - Avoid any AV nodal blockade - Continue to monitor on telemetry  Possible Sarcoidosis: Evaluation in 2007 remarkable for normal PFTs, including diffusion capacity, ACE I level <3, CT of chest for prostate cancer evaluation showed mediastinal and bilateral hilar adenopathy consistent with pulmonary sarcoid. No treatment as asymptomatic.  Signed, [name] Lex, MD 05/06/2019, 2:29 AM  For questions or updates, please contact   Please consult [email or web address] for contact info under Cardiology/STEMI.

## 2019-05-06 NOTE — Progress Notes (Signed)
Left lower extremity venous duplex completed. Refer to "CV Proc" under chart review to view preliminary results.  05/06/2019 9:21 AM Maudry Mayhew, MHA, RVT, RDCS, RDMS

## 2019-05-06 NOTE — H&P (Signed)
History and Physical    DEDRIC [name] IFO:[id number] DOB: [birth year] DOA: 05/05/2019  Referring MD/NP/PA:   PCP: [name], MD   Patient coming from:  The patient is coming from home.  At baseline, pt is independent for most of ADL.        Chief Complaint: Nausea, vomiting, abdominal pain  HPI: [name] is a 68 y.o. male with medical history significant of hypertension, hyperlipidemia, diabetes mellitus, prostate cancer (s/p of prostatectomy 2000), GERD, CKD stage III, adrenal insufficiency, sarcoidosis, small bowel obstruction, who presents with nausea, vomiting, abdominal pain.  Patient states that his abdominal pain started yesterday afternoon, which is located in the middle abdomen, moderate, constant, nonradiating.  Associated with nausea and vomiting.  He has had 5 times of nonbloody nonbloody vomitus.  Last bowel movement was yesterday morning.  No fever or chills.  No symptoms of UTI.  Patient denies any chest pain, shortness of breath, cough.  No unilateral weakness.  Patient was found to have bradycardia with heart rates down to 25, then improved to 30-40s, then 70s-80s.  EKG showed complete AV block.  He had some dizziness which has resolved.  No fall. Pt has left foot ulcer which has been going on for more than 1 year.  Patient is following up with Dr. [name]. He was started on doxycycline on 9/15.  He is supposed to take doxycycline until 9/29.  ED Course: pt was found to have WBC 3.6, pending COVID-19 test, stable renal function, temperature normal, blood pressure 187/70, 167/68, oxygen saturation 98% on room air.  Chest x-ray showed borderline cardiomegaly.  CT abdomen/pelvis showed small bowel obstruction.  Patient is admitted to stepdown bed as inpatient.  Cardiology, Dr. Kalman Shan and general surgery, Dr. Rosendo Gros were consulted.  Review of Systems:   General: no fevers, chills, no body weight gain, has poor appetite, has fatigue HEENT: no blurry vision, hearing  changes or sore throat Respiratory: no dyspnea, coughing, wheezing CV: no chest pain, no palpitations GI: has nausea, vomiting, abdominal pain, no diarrhea, constipation GU: no dysuria, burning on urination, increased urinary frequency, hematuria  Ext: has left leg edema Neuro: no unilateral weakness, numbness, or tingling, no vision change or hearing loss. Has dizziness. Skin: no rash. Has left foot plantar ulcer. MSK: No muscle spasm, no deformity, no limitation of range of movement in spin Heme: No easy bruising.  Travel history: No recent long distant travel.  Allergy:  Allergies  Allergen Reactions  . Ace Inhibitors Itching and Cough  . Naproxen Anaphylaxis, Shortness Of Breath and Other (See Comments)    Throat swells. cannot breathe, and causes GI distress    Past Medical History:  Diagnosis Date  . Cellulitis and abscess of face 10/11/2007   Qualifier: Diagnosis of  By: Amil Amen MD, Benjamine Mola    . Diabetic retinopathy ([place])   . Diabetic retinopathy associated with type 2 diabetes mellitus ([place]) 09/13/2014   He works for [employer]  . Fluid overload 09/03/2012   Post op  . GERD (gastroesophageal reflux disease)   . Prostate cancer ([place])   . Visual impairment     Past Surgical History:  Procedure Laterality Date  . CATARACT EXTRACTION W/ INTRAOCULAR LENS  IMPLANT, BILATERAL    . EYE SURGERY Bilateral    "laser OR for diabetic retinopathy"  . INGUINAL HERNIA REPAIR     [name] 07/12/2010), "don't remember which side"  . LAPAROTOMY  08/23/2012   Procedure: EXPLORATORY LAPAROTOMY;  Surgeon: Aaron Edelman  Layton, DO;  Location: [facility];  Service: General;  Laterality: N/A;  exploratory laparotomy with lysis of adhesions  . LYSIS OF ADHESION  08/23/2012   Procedure: LYSIS OF ADHESION;  Surgeon: [name], DO;  Location: [facility];  Service: General;;  . ROBOT ASSISTED LAPAROSCOPIC RADICAL PROSTATECTOMY  2000's   "had to finish manually after machine broke"  . SHOULDER  SURGERY  1970's   separation; from playing football"    Social History:  reports that he quit smoking about 52 years ago. His smoking use included cigarettes. He has a 7.50 pack-year smoking history. He has never used smokeless tobacco. He reports current alcohol use. He reports that he does not use drugs.  Family History:  Family History  Problem Relation Age of Onset  . Diabetes Mellitus II Mother   . Colon cancer Neg Hx      Prior to Admission medications   Medication Sig Start Date End Date Taking? Authorizing Provider  acetaminophen (TYLENOL) 650 MG CR tablet Take 650 mg by mouth every 8 (eight) hours as needed for pain.   Yes [provider]  aspirin 81 MG chewable tablet Chew 81 mg by mouth every morning.    Yes [provider]  atorvastatin (LIPITOR) 20 MG tablet Take 20 mg by mouth every morning.  09/27/18  Yes [provider]  chlorthalidone (HYGROTON) 25 MG tablet Take 25 mg by mouth once a week.   Yes [provider]  doxycycline (VIBRA-TABS) 100 MG tablet Take 1 tablet (100 mg total) by mouth 2 (two) times daily for 14 days. 04/30/19 05/14/19 Yes [place], Stephani Police, DPM  gabapentin (NEURONTIN) 300 MG capsule TAKE 1 CAPSULE(300 MG) BY MOUTH THREE TIMES DAILY Patient taking differently: Take 300 mg by mouth 3 (three) times daily.  02/12/19  Yes [name], MD  HYDROcodone-acetaminophen (NORCO/VICODIN) 5-325 MG tablet Take 1 tablet by mouth every 6 (six) hours as needed (pain).   Yes [provider]  Insulin Isophane & Regular Human (HUMULIN 70/30 MIX) (70-30) 100 UNIT/ML PEN Inject 26 Units into the skin daily before breakfast AND 30 Units daily before supper. Patient taking differently: Inject 20 Units into the skin daily before breakfast AND 16-26 Units daily before supper. 04/19/19  Yes [name], MD  losartan (COZAAR) 50 MG tablet Take 50 mg by mouth every morning.   Yes [provider]  methocarbamol (ROBAXIN) 500  MG tablet Take 500 mg by mouth 2 (two) times daily as needed for muscle spasms.   Yes [provider]  Multiple Vitamin (MULTIVITAMIN WITH MINERALS) TABS tablet Take 1 tablet by mouth every morning. Centrum   Yes [provider]  oxyCODONE (OXY IR/ROXICODONE) 5 MG immediate release tablet Take 1 tablet (5 mg total) by mouth every 6 (six) hours as needed for severe pain. 11/22/18  Yes Rayburn, Neta Mends, PA-C  Polyethyl Glycol-Propyl Glycol (SYSTANE OP) Place 1 drop into both eyes daily as needed (dry eyes).   Yes [provider]  traMADol (ULTRAM) 50 MG tablet Take 50 mg by mouth every 6 (six) hours as needed (pain).   Yes [provider]  amLODipine (NORVASC) 5 MG tablet Take 1 tablet (5 mg total) by mouth daily. 04/26/19   [name], MD  blood glucose meter kit and supplies KIT Dispense based on patient and insurance preference. Use up to four times daily as directed. (FOR ICD-9 250.00, 250.01). 10/24/18   Domenic Polite, MD  furosemide (LASIX) 40 MG  tablet Take 1 tablet (40 mg total) by mouth daily. 04/19/19   [name], MD  Insulin Pen Needle (PEN NEEDLES 31GX5/16") 31G X 8 MM MISC Use new needle with each insulin injection, twice a day. Dx E11.65, Z79.4 04/19/19   [name], MD  Lancets (FREESTYLE) lancets Use as instructed 09/18/14   Delfina Redwood, MD  Lancets 30G MISC  04/06/16   [provider]  losartan (COZAAR) 25 MG tablet Take 1 tablet (25 mg total) by mouth daily. Patient not taking: Reported on 05/05/2019 02/11/19   [name], MD  polyethylene glycol [facility] / Floria Raveling) packet Take 17 g by mouth daily as needed for mild constipation. Patient not taking: Reported on 05/05/2019 10/24/18   Domenic Polite, MD  silver sulfADIAZINE (SILVADENE) 1 % cream APPLY 1 APPLICCATION TOPICALLY DAILY, LEFT FOOT Patient not taking: Reported on 05/05/2019 04/08/19   Newt Minion, MD    Physical Exam: Vitals:   05/06/19 0030  05/06/19 0045 05/06/19 0100 05/06/19 0200  BP: (!) 178/74 (!) 170/72 (!) 188/79 (!) 171/71  Pulse: 78 80 81   Resp: (!) _0 (!) 9  Temp:      TempSrc:      SpO2: 100% 100% 100%   Weight:      Height:       General: Not in acute distress HEENT:       Eyes: PERRL, EOMI, no scleral icterus.       ENT: No discharge from the ears and nose, no pharynx injection, no tonsillar enlargement.        Neck: No JVD, no bruit, no mass felt. Heme: No neck lymph node enlargement. Cardiac: S1/S2, RRR, No murmurs, No gallops or rubs. Respiratory: No rales, wheezing, rhonchi or rubs. GI: distended, diffusely tender, no rebound pain, no organomegaly, BS present. GU: No hematuria Ext: 1+DP/PT pulse bilaterally. Has 1+ left leg edema. No edema in right leg. Musculoskeletal: No joint deformities, No joint redness or warmth, no limitation of ROM in spin. Skin: has a left foot plantar ulcer without active draining, has tenderness. Neuro: Alert, oriented X3, cranial nerves II-XII grossly intact, moves all extremities normally.  Psych: Patient is not psychotic, no suicidal or hemocidal ideation.  Labs on Admission: I have personally reviewed following labs and imaging studies  CBC: Recent Labs  Lab 05/05/19 2130  WBC 3.6*  NEUTROABS 1.9  HGB 10.9*  HCT 34.8*  MCV 67.7*  PLT 229   Basic Metabolic Panel: Recent Labs  Lab 05/05/19 2130  NA 133*  K 4.2  CL 102  CO2 23  GLUCOSE 127*  BUN 23  CREATININE 1.69*  CALCIUM 8.8*  MG 1.9   GFR: Estimated Creatinine Clearance: 41.8 mL/min (A) (by C-G formula based on SCr of 1.69 mg/dL (H)). Liver Function Tests: Recent Labs  Lab 05/05/19 2130  AST 33  ALT 28  ALKPHOS 77  BILITOT 1.1  PROT 6.9  ALBUMIN 3.4*   Recent Labs  Lab 05/05/19 2130  LIPASE 19   No results for input(s): AMMONIA in the last 168 hours. Coagulation Profile: No results for input(s): INR, PROTIME in the last 168 hours. Cardiac Enzymes: No results for input(s):  CKTOTAL, CKMB, CKMBINDEX, TROPONINI in the last 168 hours. BNP (last 3 results) No results for input(s): PROBNP in the last 8760 hours. HbA1C: No results for input(s): HGBA1C in the last 72 hours. CBG: No results for input(s): GLUCAP in the last 168 hours. Lipid Profile: No  results for input(s): CHOL, HDL, LDLCALC, TRIG, CHOLHDL, LDLDIRECT in the last 72 hours. Thyroid Function Tests: No results for input(s): TSH, T4TOTAL, FREET4, T3FREE, THYROIDAB in the last 72 hours. Anemia Panel: No results for input(s): VITAMINB12, FOLATE, FERRITIN, TIBC, IRON, RETICCTPCT in the last 72 hours. Urine analysis:    Component Value Date/Time   COLORURINE YELLOW 09/03/2015 1505   APPEARANCEUR CLEAR 09/03/2015 1505   LABSPEC 1.035 (H) 09/03/2015 1505   PHURINE 6.0 09/03/2015 1505   GLUCOSEU >1000 (A) 09/03/2015 1505   HGBUR NEGATIVE 09/03/2015 1505   HGBUR negative 08/19/2008 1530   BILIRUBINUR NEGATIVE 09/03/2015 1505   KETONESUR NEGATIVE 09/03/2015 1505   PROTEINUR NEGATIVE 09/03/2015 1505   UROBILINOGEN 0.2 04/08/2015 0456   NITRITE NEGATIVE 09/03/2015 1505   LEUKOCYTESUR NEGATIVE 09/03/2015 1505   Sepsis Labs: _0 (procalcitonin:4,lacticidven:4) ) Recent Results (from the past 240 hour(s))  WOUND CULTURE     Status: Abnormal   Collection Time: 04/30/19 11:57 AM   Specimen: Wound  Result Value Ref Range Status   MICRO NUMBER: [id number]  Final   SPECIMEN QUALITY: Adequate  Final   SOURCE: WOUND (SITE NOT SPECIFIED)  Final   STATUS: ADDENDUM - FINAL  Corrected   GRAM STAIN: Gram positive cocci in chains  Final    Comment: No white blood cells seen No epithelial cells seen Moderate Gram positive cocci in chains Few Gram positive bacilli   ISOLATE 1: Streptococcus agalactiae (A)  Corrected    Comment: Heavy growth of Group B Streptococcus isolated      Susceptibility   Streptococcus agalactiae - AEROBIC CULT, GRAM STAIN POSITIVE 1    AMPICILLIN <=0.25 Sensitive     CEFOTAXIME  <=0.12 Sensitive     CEFTRIAXONE <=0.12 Sensitive     VANCOMYCIN 0.5 Sensitive     CLINDAMYCIN <=0.25 Sensitive     LEVOFLOXACIN 1 Sensitive     PENO - penicillin* 0.12 Sensitive      * Legend:S = Susceptible  I = IntermediateR = Resistant  NS = Not susceptible* = Not tested  NR = Not reported**NN = See antimicrobic comments     Radiological Exams on Admission: Dg Abd 1 View  Result Date: 05/06/2019 CLINICAL DATA:  NG tube placement. EXAM: ABDOMEN - 1 VIEW COMPARISON:  CT yesterday. FINDINGS: Tip of the enteric tube below the diaphragm in the stomach, side-port in the region of the gastroesophageal junction. Dilated small bowel on prior CT not as well visualized radiographically. Stool within the colon. Excreted IV contrast in the urinary bladder from prior CT. Multiple surgical clips in the pelvis. Unchanged osseous structures. IMPRESSION: 1. Tip of the enteric tube below the diaphragm in the stomach, side-port in the region of the gastroesophageal junction. Advancement of 3 cm would lead to optimal positioning. 2. Dilated small bowel on prior CT not as well visualized radiographically. Electronically Signed   By: Keith Rake M.D.   On: 05/06/2019 03:58   Ct Abdomen Pelvis W Contrast  Result Date: 05/05/2019 CLINICAL DATA:  Initial evaluation for acute nausea, vomiting, abdominal pain. History of previous SBO. EXAM: CT ABDOMEN AND PELVIS WITH CONTRAST TECHNIQUE: Multidetector CT imaging of the abdomen and pelvis was performed using the standard protocol following bolus administration of intravenous contrast. CONTRAST:  182m OMNIPAQUE IOHEXOL 300 MG/ML  SOLN COMPARISON:  Prior CT from 12/29/2016. FINDINGS: Lower chest: Mild subsegmental atelectatic changes noted within the visualized lung bases. 9 mm nodular density along the left hemidiaphragm is little interval change as compared to previous study  from 2018, most likely a benign finding. Visualized lungs are otherwise clear. Hepatobiliary:  Liver demonstrates a normal contrast enhanced appearance. Gallbladder within normal limits. No biliary dilatation. Pancreas: Subcentimeter cystic lesion within the mid pancreatic body noted, grossly stable from previous, likely benign. Pancreas otherwise unremarkable. No pancreatic ductal dilatation or acute peripancreatic inflammation. Spleen: Spleen within normal limits. Adrenals/Urinary Tract: Adrenal glands are normal. Kidneys equal in size with symmetric enhancement. 18 mm cyst noted at the upper pole of the right kidney, similar to previous. No nephrolithiasis, hydronephrosis, or focal enhancing renal mass. No visible hydroureter. Partially distended bladder within normal limits. Stomach/Bowel: Stomach within normal limits. Multiple mildly dilated fluid-filled loops of bowel seen clustered within the upper left and mid abdomen. Focal transition point within the mid abdomen present (series 3, image 28). Small bowel is decompressed distally. Finding consistent with acute mechanical small bowel obstruction. Underlying adhesive disease is suspected. No associated pneumatosis or portal venous gas. No other acute inflammatory changes about the small bowel. Moderate retained stool seen diffusely throughout the colon, suggesting constipation. Appendix within normal limits. Vascular/Lymphatic: Normal intravascular enhancement seen throughout the intra-abdominal aorta. Mesenteric vessels patent proximally. Mild aorto bi-iliac atherosclerotic disease. No pathologically enlarged intra-abdominopelvic lymph nodes. Mildly prominent bilateral inguinal lymph nodes measure up to 13 mm in short axis, within normal limits. Reproductive: Prior prostatectomy. Other: No free air or fluid. Musculoskeletal: No acute osseous abnormality. No discrete lytic or blastic osseous lesions. Moderate degenerative spondylolysis present at L3-4 through L5-S1. Osteoarthritic changes noted about the hips bilaterally. IMPRESSION: 1. Findings  consistent with acute mechanical small bowel obstruction with transition point within the mid abdomen as above. 2. No other acute intra-abdominal or pelvic process. 3. Moderate retained stool diffusely throughout the colon, suggesting constipation. 4. Prior prostatectomy. Electronically Signed   By: Jeannine Boga M.D.   On: 05/05/2019 23:33   Dg Chest Portable 1 View  Result Date: 05/05/2019 CLINICAL DATA:  Abdominal pain. Vomiting. EXAM: PORTABLE CHEST 1 VIEW COMPARISON:  10/19/2018 FINDINGS: Borderline cardiomegaly with unchanged mediastinal contours. Pulmonary vasculature is normal. No consolidation, pleural effusion, or pneumothorax. No acute osseous abnormalities are seen. IMPRESSION: Borderline cardiomegaly.  No acute pulmonary process. Electronically Signed   By: Keith Rake M.D.   On: 05/05/2019 22:16     EKG: Independently reviewed.  Third-degree AV block, bradycardia, QTC 378, LVH, nonspecific T wave change. Assessment/Plan Principal Problem:   SBO (small bowel obstruction) (HCC) Active Problems:   Sarcoidosis   HYPERLIPIDEMIA, MIXED   Essential hypertension   Adrenal insufficiency (HCC)   Diabetic foot ulcer (HCC)   Diabetic foot infection ([place])   Stage 3 chronic kidney disease (HCC)   Complete heart block (HCC)   SBO (small bowel obstruction) ([place]): CT scan showed SBO with transition point within the mid abdomen.  Currently hemodynamically stable.  Dr. Rosendo Gros of general surgeon was consulted.  -admit to SDU as inpt due to complete heart block -NPO   -prn NG tube -morphine prn pain -Prn Zofran prn nausea   -IVF: NS 500 cc, then 75 cc/h -INR/PTT/type & screen -Follow-up general surgeons recommendation -hold all oral meds due to NG tube use  Complete heart block [facility]): Dr. Kalman Shan from cardiology was consulted.  Patient may need PPM. - Highly appreciate Dr. Elroy Channel consultation, will follow up recommendations. - Atropine prn, can add dopamine infusion if needed  for sustained bradycardia per Dr. Kalman Shan - Keep pacing pads in place - Avoid any AV nodal blockade - Trend x 3 - Repeat  EKG in the am  - aspirin, - Risk factor stratification: will check FLP and A1C, UDS, TSH - 2d echo  Diabetic foot ulcer with infection ([place]): pt is following up with Dr. [name] -switch oral to VI doxycycline -blood culture -wound care consult -get left leg LE venous doppler to r/o DVT due to asymmetric left leg edema  Possible Sarcoidosis: per Dr. Kalman Shan, "Evaluation in 2007 remarkable for normal PFTs, including diffusion capacity, ACE I level <3. CT of chest for prostate cancer evaluation showed mediastinal and bilateral hilar adenopathy consistent with pulmonary sarcoid. No treatment as asymptomatic": his Calcium 8.8. -observe  HYPERLIPIDEMIA, MIXED: -hold Lipitor  Essential hypertension: -IV hydralazine as needed  Hx of Adrenal insufficiency ([place]): -check cortisol level, then give stress dose of Solu-Cortef 50 mg twice daily  Stage 3 chronic kidney disease ([place]): Stable.  Baseline creatinine 1.6-1.8.  His creatinine is 1.69, BUN 23 -Follow-up by [facility]   Inpatient status:  # Patient requires inpatient status due to high intensity of service, high risk for further deterioration and high frequency of surveillance required.  I certify that at the point of admission it is my clinical judgment that the patient will require inpatient hospital care spanning beyond 2 midnights from the point of admission.  . This patient has multiple chronic comorbidities including hypertension, hyperlipidemia, diabetes mellitus, prostate cancer (prostatectomy 2000), GERD, CKD stage III, renal insufficiency, sarcoidosis, small bowel obstruction . Now patient has presenting with SBO and complete heart block . The worrisome physical exam findings include abdominal distention and abdominal tenderness, left foot ulcer, asymmetric left leg edema . The initial radiographic and laboratory data are  worrisome because of SBO by CT scan and complete heart block by EKG .  please see my assessment and plan . Predictability of an adverse outcome (risk): Patient has multiple comorbidities, now presents with small bowel obstruction and complete heart block.  Patient may need pacemaker placement.  His presentation is highly complicated.  Patient at high risk of deteriorating.  Will need to be treated in hospital for at least 2 days.         DVT ppx: SCD Code Status: Full code Family Communication: None at bed side. Disposition Plan:  Anticipate discharge back to previous home environment Consults called: General surgery and cardiology Admission status:  SDU/inpation       Date of Service 05/06/2019    [place] [facility]   If 7PM-7AM, please contact night-coverage [email or web address] Password [email or web address] 05/06/2019, 5:15 AM

## 2019-05-06 NOTE — Consult Note (Signed)
Reason for Consult:SBO Referring Physician: Dr. Mena Pauls is an 68 y.o. male.  HPI: Pt complains of abdominal pain that started yesterday after eating. He states the pain got worse throughout the day and became unbearable. He states he vomited 5x yesterday and was very nauseous before EMS arrived. He states he has not passed gas or had  BM since arriving, but has been able to void regularly. Pt also complains of distorted vision in his left eye, and a ulcer on his left foot. Pt also admits to SOB and diaphoresis on exertion.   Past Medical History:  Diagnosis Date  . Cellulitis and abscess of face 10/11/2007   Qualifier: Diagnosis of  By: Amil Amen MD, Benjamine Mola    . Diabetic retinopathy ([place])   . Diabetic retinopathy associated with type 2 diabetes mellitus ([place]) 09/13/2014   He works for [employer]  . Fluid overload 09/03/2012   Post op  . GERD (gastroesophageal reflux disease)   . Prostate cancer ([place])   . Visual impairment     Past Surgical History:  Procedure Laterality Date  . CATARACT EXTRACTION W/ INTRAOCULAR LENS  IMPLANT, BILATERAL    . EYE SURGERY Bilateral    "laser OR for diabetic retinopathy"  . INGUINAL HERNIA REPAIR     [name] 07/12/2010), "don't remember which side"  . LAPAROTOMY  08/23/2012   Procedure: EXPLORATORY LAPAROTOMY;  Surgeon: [name], DO;  Location: [facility];  Service: General;  Laterality: N/A;  exploratory laparotomy with lysis of adhesions  . LYSIS OF ADHESION  08/23/2012   Procedure: LYSIS OF ADHESION;  Surgeon: [name], DO;  Location: [facility];  Service: General;;  . ROBOT ASSISTED LAPAROSCOPIC RADICAL PROSTATECTOMY  2000's   "had to finish manually after machine broke"  . SHOULDER SURGERY  1970's   separation; from playing football"    Family History  Problem Relation Age of Onset  . Diabetes Mellitus II Mother   . Colon cancer Neg Hx     Social History:  reports that he quit smoking about 52 years ago. His  smoking use included cigarettes. He has a 7.50 pack-year smoking history. He has never used smokeless tobacco. He reports current alcohol use. He reports that he does not use drugs.  Allergies:  Allergies  Allergen Reactions  . Ace Inhibitors Itching and Cough  . Naproxen Anaphylaxis, Shortness Of Breath and Other (See Comments)    Throat swells. cannot breathe, and causes GI distress    Medications:   Results for orders placed or performed during the hospital encounter of 05/05/19 (from the past 48 hour(s))  CBC with Differential     Status: Abnormal   Collection Time: 05/05/19  9:30 PM  Result Value Ref Range   WBC 3.6 (L) 4.0 - 10.5 K/uL   RBC 5.14 4.22 - 5.81 MIL/uL   Hemoglobin 10.9 (L) 13.0 - 17.0 g/dL   HCT 34.8 (L) 39.0 - 52.0 %   MCV 67.7 (L) 80.0 - 100.0 fL   MCH 21.2 (L) 26.0 - 34.0 pg   MCHC 31.3 30.0 - 36.0 g/dL   RDW 17.7 (H) 11.5 - 15.5 %   Platelets 202 150 - 400 K/uL   nRBC 0.0 0.0 - 0.2 %   Neutrophils Relative % 52 %   Neutro Abs 1.9 1.7 - 7.7 K/uL   Lymphocytes Relative 29 %   Lymphs Abs 1.1 0.7 - 4.0 K/uL   Monocytes Relative 14 %   Monocytes Absolute 0.5  0.1 - 1.0 K/uL   Eosinophils Relative 4 %   Eosinophils Absolute 0.2 0.0 - 0.5 K/uL   Basophils Relative 1 %   Basophils Absolute 0.0 0.0 - 0.1 K/uL   Immature Granulocytes 0 %   Abs Immature Granulocytes 0.01 0.00 - 0.07 K/uL    Comment: Performed at [facility] [address] [place], [place] [address]  Comprehensive metabolic panel     Status: Abnormal   Collection Time: 05/05/19  9:30 PM  Result Value Ref Range   Sodium 133 (L) 135 - 145 mmol/L   Potassium 4.2 3.5 - 5.1 mmol/L   Chloride 102 98 - 111 mmol/L   CO2 23 22 - 32 mmol/L   Glucose, Bld 127 (H) 70 - 99 mg/dL   BUN 23 8 - 23 mg/dL   Creatinine, Ser 1.69 (H) 0.61 - 1.24 mg/dL   Calcium 8.8 (L) 8.9 - 10.3 mg/dL   Total Protein 6.9 6.5 - 8.1 g/dL   Albumin 3.4 (L) 3.5 - 5.0 g/dL   AST 33 15 - 41 U/L   ALT 28 0 - 44 U/L    Alkaline Phosphatase 77 38 - 126 U/L   Total Bilirubin 1.1 0.3 - 1.2 mg/dL   GFR calc non Af Amer 41 (L) >60 mL/min   GFR calc Af Amer 47 (L) >60 mL/min   Anion gap 8 5 - 15    Comment: Performed at [facility] [address], [place], [place] [address]  Magnesium     Status: None   Collection Time: 05/05/19  9:30 PM  Result Value Ref Range   Magnesium 1.9 1.7 - 2.4 mg/dL    Comment: Performed at [facility], [place] [address], [place], [place] [address]  Lipase, blood     Status: None   Collection Time: 05/05/19  9:30 PM  Result Value Ref Range   Lipase 19 11 - 51 U/L    Comment: Performed at [facility] [address], [place], [place] [address]  SARS CORONAVIRUS 2 (TAT 6-24 HRS) Nasopharyngeal Nasopharyngeal Swab     Status: None   Collection Time: 05/05/19 10:47 PM   Specimen: Nasopharyngeal Swab  Result Value Ref Range   SARS Coronavirus 2 NEGATIVE NEGATIVE    Comment: (NOTE) SARS-CoV-2 target nucleic acids are NOT DETECTED. The SARS-CoV-2 RNA is generally detectable in upper and lower respiratory specimens during the acute phase of infection. Negative results do not preclude SARS-CoV-2 infection, do not rule out co-infections with other pathogens, and should not be used as the sole basis for treatment or other patient management decisions. Negative results must be combined with clinical observations, patient history, and epidemiological information. The expected result is Negative. Fact Sheet for Patients: SugarRoll.be Fact Sheet for Healthcare Providers: https://www.woods-mathews.com/ This test is not yet approved or cleared by the [place] FDA and  has been authorized for detection and/or diagnosis of SARS-CoV-2 by FDA under an Emergency Use Authorization (EUA). This EUA will remain  in effect (meaning this test can be used) for the duration of the COVID-19 declaration under Section 56 4(b)(1) of the Act,  21 U.S.C. section 360bbb-3(b)(1), unless the authorization is terminated or revoked sooner. Performed at [facility], [place] [address] [place], [place] [address]   Troponin I (High Sensitivity)     Status: None   Collection Time: 05/06/19  2:20 AM  Result Value Ref Range   Troponin I (High Sensitivity) 9 <18  ng/L    Comment: (NOTE) Elevated high sensitivity troponin I (hsTnI) values and significant  changes across serial measurements may suggest ACS but many other  chronic and acute conditions are known to elevate hsTnI results.  Refer to the "Links" section for chest pain algorithms and additional  guidance. Performed at [facility], [place] [address], [place], [place] [address]   Cortisol-am, blood     Status: Abnormal   Collection Time: 05/06/19  2:20 AM  Result Value Ref Range   Cortisol - AM 25.6 (H) 6.7 - 22.6 ug/dL    Comment: Performed at [facility] [address] [place], [place] [address]  Cortisol-am, blood     Status: Abnormal   Collection Time: 05/06/19  5:20 AM  Result Value Ref Range   Cortisol - AM 49.1 (H) 6.7 - 22.6 ug/dL    Comment: Performed at [facility] [address] [place], [place] [address]  Troponin I (High Sensitivity)     Status: None   Collection Time: 05/06/19  5:20 AM  Result Value Ref Range   Troponin I (High Sensitivity) 12 <18 ng/L    Comment: (NOTE) Elevated high sensitivity troponin I (hsTnI) values and significant  changes across serial measurements may suggest ACS but many other  chronic and acute conditions are known to elevate hsTnI results.  Refer to the "Links" section for chest pain algorithms and additional  guidance. Performed at [facility], [place] [address], [place], [place] [address]   Brain natriuretic peptide     Status: Abnormal   Collection Time: 05/06/19  5:20 AM  Result Value Ref Range   B Natriuretic Peptide 133.6 (H) 0.0 - 100.0 pg/mL    Comment: Performed at [facility] [address], [place], [place] [address]  Hemoglobin A1c     Status: Abnormal   Collection Time: 05/06/19  5:20 AM  Result Value Ref Range   Hgb A1c MFr Bld 10.3 (H) 4.8 - 5.6 %    Comment: (NOTE) Pre diabetes:          5.7%-6.4% Diabetes:              >6.4% Glycemic control for   <7.0% adults with diabetes    Mean Plasma Glucose 248.91 mg/dL    Comment: Performed at [facility] [address] [place], [place] [address]  Lipid panel     Status: None   Collection Time: 05/06/19  5:20 AM  Result Value Ref Range   Cholesterol 136 0 - 200 mg/dL   Triglycerides 47 <150 mg/dL   HDL 53 >40 mg/dL   Total CHOL/HDL Ratio 2.6 RATIO   VLDL 9 0 - 40 mg/dL   LDL Cholesterol 74 0 - 99 mg/dL    Comment:        Total Cholesterol/HDL:CHD Risk Coronary Heart Disease Risk Table                     Men   Women  1/2 Average Risk   3.4   3.3  Average Risk       5.0   4.4  2 X Average Risk   9.6   7.1  3 X Average Risk  23.4   11.0        Use the calculated Patient Ratio above and the CHD Risk Table to determine the patient's CHD Risk.        ATP III  CLASSIFICATION (LDL):  <100     mg/dL   Optimal  100-129  mg/dL   Near or Above                    Optimal  130-159  mg/dL   Borderline  160-189  mg/dL   High  >190     mg/dL   Very High Performed at [facility] [address], [place], [place] [address]   TSH     Status: None   Collection Time: 05/06/19  5:20 AM  Result Value Ref Range   TSH 1.370 0.350 - 4.500 uIU/mL    Comment: Performed by a 3rd Generation assay with a functional sensitivity of <=0.01 uIU/mL. Performed at [facility], [place] [address] [place], [place] [address]   Basic metabolic panel     Status: Abnormal   Collection Time: 05/06/19  5:20 AM  Result Value Ref Range   Sodium 136 135 - 145 mmol/L   Potassium 4.8 3.5 - 5.1 mmol/L   Chloride 103 98 - 111 mmol/L   CO2 24 22 - 32 mmol/L   Glucose, Bld 143 (H) 70 - 99 mg/dL   BUN 20 8 - 23 mg/dL    Creatinine, Ser 1.63 (H) 0.61 - 1.24 mg/dL   Calcium 8.8 (L) 8.9 - 10.3 mg/dL   GFR calc non Af Amer 42 (L) >60 mL/min   GFR calc Af Amer 49 (L) >60 mL/min   Anion gap 9 5 - 15    Comment: Performed at [facility] [address], [place], [place] [address]  CBC     Status: Abnormal   Collection Time: 05/06/19  5:20 AM  Result Value Ref Range   WBC 4.7 4.0 - 10.5 K/uL   RBC 5.20 4.22 - 5.81 MIL/uL   Hemoglobin 10.5 (L) 13.0 - 17.0 g/dL   HCT 35.2 (L) 39.0 - 52.0 %   MCV 67.7 (L) 80.0 - 100.0 fL   MCH 20.2 (L) 26.0 - 34.0 pg   MCHC 29.8 (L) 30.0 - 36.0 g/dL   RDW 17.3 (H) 11.5 - 15.5 %   Platelets 210 150 - 400 K/uL    Comment: REPEATED TO VERIFY   nRBC 0.0 0.0 - 0.2 %    Comment: Performed at [facility], [place] [address], [place], [place] [address]    Dg Abd 1 View  Result Date: 05/06/2019 CLINICAL DATA:  NG tube placement. EXAM: ABDOMEN - 1 VIEW COMPARISON:  CT yesterday. FINDINGS: Tip of the enteric tube below the diaphragm in the stomach, side-port in the region of the gastroesophageal junction. Dilated small bowel on prior CT not as well visualized radiographically. Stool within the colon. Excreted IV contrast in the urinary bladder from prior CT. Multiple surgical clips in the pelvis. Unchanged osseous structures. IMPRESSION: 1. Tip of the enteric tube below the diaphragm in the stomach, side-port in the region of the gastroesophageal junction. Advancement of 3 cm would lead to optimal positioning. 2. Dilated small bowel on prior CT not as well visualized radiographically. Electronically Signed   By: Keith Rake M.D.   On: 05/06/2019 03:58   Ct Abdomen Pelvis W Contrast  Result Date: 05/05/2019 CLINICAL DATA:  Initial evaluation for acute nausea, vomiting, abdominal pain. History of previous SBO. EXAM: CT ABDOMEN AND PELVIS WITH CONTRAST TECHNIQUE: Multidetector CT imaging of the abdomen and pelvis was performed using the standard protocol following bolus  administration of intravenous  contrast. CONTRAST:  19mL OMNIPAQUE IOHEXOL 300 MG/ML  SOLN COMPARISON:  Prior CT from 12/29/2016. FINDINGS: Lower chest: Mild subsegmental atelectatic changes noted within the visualized lung bases. 9 mm nodular density along the left hemidiaphragm is little interval change as compared to previous study from 2018, most likely a benign finding. Visualized lungs are otherwise clear. Hepatobiliary: Liver demonstrates a normal contrast enhanced appearance. Gallbladder within normal limits. No biliary dilatation. Pancreas: Subcentimeter cystic lesion within the mid pancreatic body noted, grossly stable from previous, likely benign. Pancreas otherwise unremarkable. No pancreatic ductal dilatation or acute peripancreatic inflammation. Spleen: Spleen within normal limits. Adrenals/Urinary Tract: Adrenal glands are normal. Kidneys equal in size with symmetric enhancement. 18 mm cyst noted at the upper pole of the right kidney, similar to previous. No nephrolithiasis, hydronephrosis, or focal enhancing renal mass. No visible hydroureter. Partially distended bladder within normal limits. Stomach/Bowel: Stomach within normal limits. Multiple mildly dilated fluid-filled loops of bowel seen clustered within the upper left and mid abdomen. Focal transition point within the mid abdomen present (series 3, image 28). Small bowel is decompressed distally. Finding consistent with acute mechanical small bowel obstruction. Underlying adhesive disease is suspected. No associated pneumatosis or portal venous gas. No other acute inflammatory changes about the small bowel. Moderate retained stool seen diffusely throughout the colon, suggesting constipation. Appendix within normal limits. Vascular/Lymphatic: Normal intravascular enhancement seen throughout the intra-abdominal aorta. Mesenteric vessels patent proximally. Mild aorto bi-iliac atherosclerotic disease. No pathologically enlarged intra-abdominopelvic  lymph nodes. Mildly prominent bilateral inguinal lymph nodes measure up to 13 mm in short axis, within normal limits. Reproductive: Prior prostatectomy. Other: No free air or fluid. Musculoskeletal: No acute osseous abnormality. No discrete lytic or blastic osseous lesions. Moderate degenerative spondylolysis present at L3-4 through L5-S1. Osteoarthritic changes noted about the hips bilaterally. IMPRESSION: 1. Findings consistent with acute mechanical small bowel obstruction with transition point within the mid abdomen as above. 2. No other acute intra-abdominal or pelvic process. 3. Moderate retained stool diffusely throughout the colon, suggesting constipation. 4. Prior prostatectomy. Electronically Signed   By: Jeannine Boga M.D.   On: 05/05/2019 23:33   Dg Chest Portable 1 View  Result Date: 05/05/2019 CLINICAL DATA:  Abdominal pain. Vomiting. EXAM: PORTABLE CHEST 1 VIEW COMPARISON:  10/19/2018 FINDINGS: Borderline cardiomegaly with unchanged mediastinal contours. Pulmonary vasculature is normal. No consolidation, pleural effusion, or pneumothorax. No acute osseous abnormalities are seen. IMPRESSION: Borderline cardiomegaly.  No acute pulmonary process. Electronically Signed   By: Keith Rake M.D.   On: 05/05/2019 22:16    Review of Systems  Constitutional: Positive for diaphoresis. Negative for chills, fever and weight loss.  Eyes: Positive for blurred vision.  Respiratory: Positive for shortness of breath. Negative for cough and hemoptysis.   Cardiovascular: Positive for chest pain and leg swelling.  Gastrointestinal: Positive for abdominal pain, constipation, nausea and vomiting.   Blood pressure (!) 180/79, pulse 84, temperature 98.3 F (36.8 C), temperature source Oral, resp. rate 17, height 5\' 7"  (1.702 m), weight 80 kg, SpO2 96 %. Physical Exam  Constitutional: He is oriented to person, place, and time. He appears well-developed and well-nourished. No distress.  HENT:  Head:  Normocephalic and atraumatic.  Neck: Neck supple. No JVD present. No tracheal deviation present. No thyromegaly present.  Cardiovascular: Normal rate, regular rhythm and normal heart sounds. Exam reveals no gallop and no friction rub.  No murmur heard. Respiratory: No stridor. He is in respiratory distress. He has no wheezes. He has no rales. He exhibits  no tenderness.  GI: He exhibits distension. There is abdominal tenderness. There is guarding.  Musculoskeletal:        General: Edema present. No tenderness.  Lymphadenopathy:    He has no cervical adenopathy.  Neurological: He is alert and oriented to person, place, and time.  Skin: Skin is warm and dry.    Assessment/Plan: SBO mostly likely due to adhesions  FEN: NPO, NG tube in place  ID: none DVT: none  Follow up: TBD SBO protocol, initiate contrast today, monitor over the next few days.  [name] 05/06/2019, 8:46 AM

## 2019-05-06 NOTE — Progress Notes (Signed)
  Pt orientation to unit, room and routine. Information packet given to patient/family and safety video watched.  Admission INP armband ID verified with patient/family, and in place. SR up x 2, fall risk assessment complete with Patient and family verbalizing understanding of risks associated with falls. Pt verbalizes an understanding of how to use the call bell and to call for help before getting out of bed.  ulcer noted in the sole of his Right foot  Will cont to monitor and assist as needed.pt placed on telemetry.   Hosie Spangle, [place] 05/06/2019 7:38 PM

## 2019-05-06 NOTE — ED Provider Notes (Signed)
[name] to follow-up on CT scan.  Patient seen originally for abdominal pain.  He started having pain earlier today and pain has worsened.  Patient does have a history of small bowel obstruction.  CT scan has been performed and reviewed.  He does have a small bowel obstruction with a transition point in the mid abdomen.  Exam reveals mild distention, tympanitic abdomen with preserved hypoactive bowel sounds.  He has not febrile, normal white count.  Discussed with Dr. Rosendo Gros, agrees with NG tube and admission to hospitalist.  Patient also noted to be in complete heart block at arrival to the emergency department.  Patient was stable, no hypotension.  He was monitored and now has resolved, currently in sinus rhythm.   EKG Interpretation  Date/Time:  Monday May 06 2019 00:51:15 EDT Ventricular Rate:  78 PR Interval:    QRS Duration: 79 QT Interval:  348 QTC Calculation: 397 R Axis:   59 Text Interpretation:  Sinus rhythm Short PR interval Probable left atrial enlargement Probable LVH with secondary repol abnrm complete heart block has resolved Confirmed by Orpah Greek [phone]) on 05/06/2019 1:03:27 AM      Cardiology consult pending.  Will admit to hospitalist service.   Orpah Greek, MD 05/06/19 [phone]

## 2019-05-06 NOTE — Telephone Encounter (Signed)
Pt left a voicemail stating he has been admitted to the hospital and does not believe he will be released in time to make his appt tomorrow at 10:15 for his ulcer check. Pt wanted to let both Dr. Elisha Ponder and Dr. [name] know.

## 2019-05-06 NOTE — ED Notes (Signed)
Pt placed in Hospital bed for comfort.  Pt sitting in chair at this time at bedside.  NG remains connected to low wall suction.  Pt updated on plan of care

## 2019-05-06 NOTE — Progress Notes (Signed)
Progress Note  Patient Name: [name] Date of Encounter: 05/06/2019  Primary Cardiologist: Evalina Field, MD   Subjective   Pt denies dizziness or lightheadedness with his documented bradycardia/AV block. He reports DOE over the past several months.  Inpatient Medications    Scheduled Meds: . diatrizoate meglumine-sodium  90 mL Per NG tube Once  . hydrocortisone sod succinate (SOLU-CORTEF) inj  50 mg Intravenous Q12H  . insulin aspart  0-9 Units Subcutaneous TID WC  . insulin aspart protamine- aspart  8 Units Subcutaneous BID WC   Continuous Infusions: . sodium chloride 75 mL/hr at 05/06/19 0231  . doxycycline (VIBRAMYCIN) IV Stopped (05/06/19 0919)   PRN Meds: acetaminophen **OR** acetaminophen, atropine, hydrALAZINE, morphine injection, ondansetron (ZOFRAN) IV   Vital Signs    Vitals:   05/06/19 0430 05/06/19 0500 05/06/19 0600 05/06/19 0700  BP:  (!) 160/68 (!) 180/79   Pulse: (!) 58   84  Resp: 18 10 16 17   Temp:      TempSrc:      SpO2: 98%   96%  Weight:      Height:        Intake/Output Summary (Last 24 hours) at 05/06/2019 1043 Last data filed at 05/06/2019 0919 Gross per 24 hour  Intake 1230 ml  Output -  Net 1230 ml   Last 3 Weights 05/05/2019 04/26/2019 04/19/2019  Weight (lbs) 176 lb 5.9 oz 182 lb 12.8 oz 181 lb  Weight (kg) 80 kg 82.918 kg 82.101 kg      Telemetry    Appears to be sinus rhythm with prolonged PR - Personally Reviewed  ECG    Sinus rhythm with HR 83, prolonged PR - Personally Reviewed  Physical Exam   GEN: No acute distress.   Neck: No JVD Cardiac: RRR, no murmurs, rubs, or gallops.  Respiratory: Clear to auscultation bilaterally. GI: NG tube in place MS: No edema; left foot ulcer, covered Neuro:  Nonfocal  Psych: Normal affect   Labs    High Sensitivity Troponin:   Recent Labs  Lab 05/06/19 0220 05/06/19 0520  TROPONINIHS 9 12      Chemistry Recent Labs  Lab 05/05/19 2130 05/06/19 0520  NA 133*  136  K 4.2 4.8  CL 102 103  CO2 23 24  GLUCOSE 127* 143*  BUN 23 20  CREATININE 1.69* 1.63*  CALCIUM 8.8* 8.8*  PROT 6.9  --   ALBUMIN 3.4*  --   AST 33  --   ALT 28  --   ALKPHOS 77  --   BILITOT 1.1  --   GFRNONAA 41* 42*  GFRAA 47* 49*  ANIONGAP 8 9     Hematology Recent Labs  Lab 05/05/19 2130 05/06/19 0520  WBC 3.6* 4.7  RBC 5.14 5.20  HGB 10.9* 10.5*  HCT 34.8* 35.2*  MCV 67.7* 67.7*  MCH 21.2* 20.2*  MCHC 31.3 29.8*  RDW 17.7* 17.3*  PLT 202 210    BNP Recent Labs  Lab 05/06/19 0520  BNP 133.6*     DDimer No results for input(s): DDIMER in the last 168 hours.   Radiology    Dg Abd 1 View  Result Date: 05/06/2019 CLINICAL DATA:  NG tube placement. EXAM: ABDOMEN - 1 VIEW COMPARISON:  CT yesterday. FINDINGS: Tip of the enteric tube below the diaphragm in the stomach, side-port in the region of the gastroesophageal junction. Dilated small bowel on prior CT not as well visualized radiographically. Stool within the colon.  Excreted IV contrast in the urinary bladder from prior CT. Multiple surgical clips in the pelvis. Unchanged osseous structures. IMPRESSION: 1. Tip of the enteric tube below the diaphragm in the stomach, side-port in the region of the gastroesophageal junction. Advancement of 3 cm would lead to optimal positioning. 2. Dilated small bowel on prior CT not as well visualized radiographically. Electronically Signed   By: Keith Rake M.D.   On: 05/06/2019 03:58   Ct Abdomen Pelvis W Contrast  Result Date: 05/05/2019 CLINICAL DATA:  Initial evaluation for acute nausea, vomiting, abdominal pain. History of previous SBO. EXAM: CT ABDOMEN AND PELVIS WITH CONTRAST TECHNIQUE: Multidetector CT imaging of the abdomen and pelvis was performed using the standard protocol following bolus administration of intravenous contrast. CONTRAST:  118mL OMNIPAQUE IOHEXOL 300 MG/ML  SOLN COMPARISON:  Prior CT from 12/29/2016. FINDINGS: Lower chest: Mild subsegmental  atelectatic changes noted within the visualized lung bases. 9 mm nodular density along the left hemidiaphragm is little interval change as compared to previous study from 2018, most likely a benign finding. Visualized lungs are otherwise clear. Hepatobiliary: Liver demonstrates a normal contrast enhanced appearance. Gallbladder within normal limits. No biliary dilatation. Pancreas: Subcentimeter cystic lesion within the mid pancreatic body noted, grossly stable from previous, likely benign. Pancreas otherwise unremarkable. No pancreatic ductal dilatation or acute peripancreatic inflammation. Spleen: Spleen within normal limits. Adrenals/Urinary Tract: Adrenal glands are normal. Kidneys equal in size with symmetric enhancement. 18 mm cyst noted at the upper pole of the right kidney, similar to previous. No nephrolithiasis, hydronephrosis, or focal enhancing renal mass. No visible hydroureter. Partially distended bladder within normal limits. Stomach/Bowel: Stomach within normal limits. Multiple mildly dilated fluid-filled loops of bowel seen clustered within the upper left and mid abdomen. Focal transition point within the mid abdomen present (series 3, image 28). Small bowel is decompressed distally. Finding consistent with acute mechanical small bowel obstruction. Underlying adhesive disease is suspected. No associated pneumatosis or portal venous gas. No other acute inflammatory changes about the small bowel. Moderate retained stool seen diffusely throughout the colon, suggesting constipation. Appendix within normal limits. Vascular/Lymphatic: Normal intravascular enhancement seen throughout the intra-abdominal aorta. Mesenteric vessels patent proximally. Mild aorto bi-iliac atherosclerotic disease. No pathologically enlarged intra-abdominopelvic lymph nodes. Mildly prominent bilateral inguinal lymph nodes measure up to 13 mm in short axis, within normal limits. Reproductive: Prior prostatectomy. Other: No free  air or fluid. Musculoskeletal: No acute osseous abnormality. No discrete lytic or blastic osseous lesions. Moderate degenerative spondylolysis present at L3-4 through L5-S1. Osteoarthritic changes noted about the hips bilaterally. IMPRESSION: 1. Findings consistent with acute mechanical small bowel obstruction with transition point within the mid abdomen as above. 2. No other acute intra-abdominal or pelvic process. 3. Moderate retained stool diffusely throughout the colon, suggesting constipation. 4. Prior prostatectomy. Electronically Signed   By: Jeannine Boga M.D.   On: 05/05/2019 23:33   Dg Chest Portable 1 View  Result Date: 05/05/2019 CLINICAL DATA:  Abdominal pain. Vomiting. EXAM: PORTABLE CHEST 1 VIEW COMPARISON:  10/19/2018 FINDINGS: Borderline cardiomegaly with unchanged mediastinal contours. Pulmonary vasculature is normal. No consolidation, pleural effusion, or pneumothorax. No acute osseous abnormalities are seen. IMPRESSION: Borderline cardiomegaly.  No acute pulmonary process. Electronically Signed   By: Keith Rake M.D.   On: 05/05/2019 22:16   Vas [place] Lower Extremity Venous (dvt)  Result Date: 05/06/2019  Lower Venous Study Indications: Swelling, and Pain.  Limitations: Body habitus, poor ultrasound/tissue interface and bandages. Comparison Study: 10/19/2018- negative left lower extremity venous duplex.  Performing Technologist: Maudry Mayhew MHA, RDMS, RVT, RDCS  Examination Guidelines: A complete evaluation includes B-mode imaging, spectral Doppler, color Doppler, and power Doppler as needed of all accessible portions of each vessel. Bilateral testing is considered an integral part of a complete examination. Limited examinations for reoccurring indications may be performed as noted.  Right CFV not visualized.  +---------+---------------+---------+-----------+----------+--------------+ LEFT     CompressibilityPhasicitySpontaneityPropertiesThrombus Aging  +---------+---------------+---------+-----------+----------+--------------+ CFV      Full           No       Yes                                 +---------+---------------+---------+-----------+----------+--------------+ SFJ      Full                                                        +---------+---------------+---------+-----------+----------+--------------+ FV Prox  Full                                                        +---------+---------------+---------+-----------+----------+--------------+ FV Mid   Full                                                        +---------+---------------+---------+-----------+----------+--------------+ FV DistalFull                                                        +---------+---------------+---------+-----------+----------+--------------+ PFV      Full                                                        +---------+---------------+---------+-----------+----------+--------------+ POP      Full           Yes      Yes                                 +---------+---------------+---------+-----------+----------+--------------+ PTV      Full                    Yes                                 +---------+---------------+---------+-----------+----------+--------------+ Unable to adequately visualize the left peroneal veins. Limited visualization of the left posterior tibial veins.    Summary: Left: There is no evidence of deep vein thrombosis in the lower extremity. However, portions of this examination were limited- see technologist comments above. No cystic structure found in the  popliteal fossa. Ultrasound characteristics of enlarged lymph  nodes noted in the groin.  *See table(s) above for measurements and observations.    Preliminary     Cardiac Studies   Lexiscan SPECT MPI 10/09/2018: There is a small sized mild intensity reversible defect of the lateral wall suggestive of ischemia. Fixed inferior  defect with no associated wall motion abnormality consistent with diaphragmatic attenuation artifact. Normal LV function with a calculated EF of 63%.  TTE 10/05/2018 The left ventricular size is normal. Left ventricular systolic function is normal. LV ejection fraction = 60-65%. Left ventricular filling pattern is prolonged relaxation. The left ventricular wall motion is normal. The right ventricle is normal in size and function. Diffuse thickening of the aortic valve with preserved cusp opening. There is mild aortic regurgitation. The aortic sinus is normal size. There is no pericardial effusion. There is no comparison study available.   Patient Profile     68 y.o. male  with a history of diabetes, hypertension, CKD III, prostate cancer, and small bowel obstruction. He also carries a diagnosis of pulmonary sarcoidosis from 2007, though this seems to be based on chest imaging alone. He presented to [facility] with abdominal pain and N/V found to have SBO. Cardiology was consulted for bradycardia.   Assessment & Plan    1. High grade AV block, episode of complete heart block - pt has history of first degree heart block - on arrival, appeared to have Wenckebach which progressed to what appeared to be CHB for several minutes - he is now in sinus rhythm in the 80s - hx of sarcoidosis - he denies symptoms with his bradycardia - favor EP consult for consideration of PPM   2. Abnormal nuclear stress test performed for DOE - no further workup was initiated - he denies CP, but continues to report DOE    3. Sarcoidosis - has been seen by [facility] pulmonology without specific treatment as he has been asymptomatic    4. Uncontrolled DM - A1c improved to 10.3% from 13.9% - can't find his glucometer        For questions or updates, please contact [place] Please consult [email or web address] for contact info under        Signed, Ledora Bottcher, PA  05/06/2019, 10:43  AM

## 2019-05-06 NOTE — Consult Note (Signed)
[place] Nurse wound consult note Patient receiving care in [facility] ED12 Reason for Consult: Left foot wound, neuropathic Wound type: chronic area on plantar surface of Left foot, present since early this year.  Patient has been seen by Dr. [name], and most recently, from [facility] for treatment of the area.  According to the patient, the [facility] group told him not to wear compression on the LLE, and provided him with a brown, granular substance in an unlabeled syringe, that he has been putting on it daily. Measurement: There is not an open wound present, but there is an area that has multiple colors to it Wound bed: overlying tissue is intact Drainage (amount, consistency, odor) no drainage, no odor, no pain elicited, except the patient states there is some sensitivity when I palpate the lateral aspect of the foot. Periwound: intact Dressing procedure/placement/frequency: Apply iodine (from the swabsticks in clean utility) to the area of discoloration on the plantar surface of the left foot.  Allow to air dry. Wrap in kerlex. Monitor the wound area(s) for worsening of condition such as: Signs/symptoms of infection,  Increase in size,  Development of or worsening of odor, Development of pain, or increased pain at the affected locations.  Notify the medical team if any of these develop.  Thank you for the consult.  Discussed plan of care with the patient.  [place] nurse will not follow at this time.  Please re-consult the [place] team if needed.  Val Riles, RN, MSN, CWOCN, CNS-BC, pager [phone]

## 2019-05-06 NOTE — Progress Notes (Signed)
Patient seen and examined personally, I reviewed the chart, history and physical and admission note, done by admitting physician this morning and agree with the same with following addendum.  Please refer to the morning admission note for more detailed plan of care.  Briefly, 68 y.o. male with medical history significant of hypertension, hyperlipidemia, diabetes mellitus, prostate cancer (s/p of prostatectomy 2000), GERD, CKD stage III, adrenal insufficiency, sarcoidosis, small bowel obstruction, who presents with nausea, vomiting, abdominal pain located in the middle abdomen, moderate, constant, nonradiating. HE had nausea and vomiting. x5  NBNB .Last bowel movement was 9/20 am > denied fever or chills. In ER ,Patient was found to have bradycardia with heart rates down to 25, then improved to 30-40s, then 70s-80s.  KG showed complete AV block.  He had some dizziness which has resolved.  No fall. Pt has left foot ulcer which has been going on for more than 1 year.  Patient is following up with Dr. [name]. He was started on doxycycline on 9/15.  He is supposed to take doxycycline until 9/29.  labs-WBC 3.6, COVID-19 negative 9/21, stable renal function, temperature normal, blood pressure 187/70, 167/68, oxygen saturation 98% on room air.  Chest x-ray showed borderline cardiomegaly.  CT abdomen/pelvis showed small bowel obstruction.  Patient is admitted to stepdown bed as inpatient.  Cardiology, Dr. Kalman Shan and general surgery, Dr. Rosendo Gros were consulted.  On my exam tThis am: No nausea, vomiting this am, not passing gas. denies chest pain, sob. ngt in place  Bowel sounds absent. Chest clear Aaox. Has bulging disc with "hump" on upper neck on back. ISSUES  Small bowel obstruction: Surgery on consult, continue NG tube, n.p.o. IV fluids and further surgery plan recommendation  Complete heart block seen in the ER cardiology consulted already following closely.  May need PPM.  Currently normal sinus rhythm in  the 80s has history of sarcoidosis. Will avoid beta-blocker calcium channel blocker.  Monitor closely, plan as per cardiology.  Abnormal nuclear stress test obtained for dyspnea on exertion: Per cardiology.  Continue aspirin and statin.  Sarcoidosis seen by [facility] pulmonology without specific treatment and has been asymptomatic  CKD Stage III baseline creatinine 1.6-1.8, stable.  Microcytic anemia.  Stable hb- monitor  Diabetic foot ulcer with infection,Chronic left foor wound followed by Dr [name] and wound care.  Continue his home regimen.Left leg [place] being done in ER nad neg for dvt  Retinopathy from DM: Has chronic blurring of vision.sees spots on left eye.Sees ophthalmology at [facility]. Reports he is visually impaired.  Diabetes mellitus, last hemoglobin A1c 10.39/21.  Fairly stable continue NovoLog Mix 70/30, 8 units twice daily and sliding scale insulin Recent Labs  Lab 05/06/19 0930 05/06/19 1308  GLUCAP 156* 91   Mixed hyperlipidemia Hypertension: Fairly stable. hold chlorthalidone, losartan amlodipine, Lasix. History of adrenal insufficiency: Cortisol level was stable and appropriately up at 25 and repeat at 49.  On Solu-Cortef 50 twice daily on admission-start to wean off.

## 2019-05-06 NOTE — Telephone Encounter (Signed)
Pt is currently in the hospital and requested you give him a call as he will not be able to make his appointment tomorrow.

## 2019-05-07 ENCOUNTER — Ambulatory Visit: Payer: Medicare Other | Admitting: Family

## 2019-05-07 ENCOUNTER — Ambulatory Visit: Payer: Medicare Other | Admitting: Podiatry

## 2019-05-07 ENCOUNTER — Inpatient Hospital Stay (HOSPITAL_COMMUNITY): Payer: Medicare Other

## 2019-05-07 DIAGNOSIS — L97521 Non-pressure chronic ulcer of other part of left foot limited to breakdown of skin: Secondary | ICD-10-CM

## 2019-05-07 DIAGNOSIS — E11621 Type 2 diabetes mellitus with foot ulcer: Secondary | ICD-10-CM

## 2019-05-07 DIAGNOSIS — I10 Essential (primary) hypertension: Secondary | ICD-10-CM

## 2019-05-07 DIAGNOSIS — E11610 Type 2 diabetes mellitus with diabetic neuropathic arthropathy: Secondary | ICD-10-CM

## 2019-05-07 DIAGNOSIS — L97529 Non-pressure chronic ulcer of other part of left foot with unspecified severity: Secondary | ICD-10-CM

## 2019-05-07 DIAGNOSIS — M14672 Charcot's joint, left ankle and foot: Secondary | ICD-10-CM

## 2019-05-07 DIAGNOSIS — R0602 Shortness of breath: Secondary | ICD-10-CM

## 2019-05-07 LAB — COMPREHENSIVE METABOLIC PANEL
ALT: 22 U/L (ref 0–44)
AST: 30 U/L (ref 15–41)
Albumin: 3.2 g/dL — ABNORMAL LOW (ref 3.5–5.0)
Alkaline Phosphatase: 59 U/L (ref 38–126)
Anion gap: 10 (ref 5–15)
BUN: 21 mg/dL (ref 8–23)
CO2: 21 mmol/L — ABNORMAL LOW (ref 22–32)
Calcium: 8.9 mg/dL (ref 8.9–10.3)
Chloride: 110 mmol/L (ref 98–111)
Creatinine, Ser: 1.82 mg/dL — ABNORMAL HIGH (ref 0.61–1.24)
GFR calc Af Amer: 43 mL/min — ABNORMAL LOW (ref >60)
GFR calc non Af Amer: 37 mL/min — ABNORMAL LOW (ref >60)
Glucose, Bld: 86 mg/dL (ref 70–99)
Potassium: 4.1 mmol/L (ref 3.5–5.1)
Sodium: 141 mmol/L (ref 135–145)
Total Bilirubin: 1.2 mg/dL (ref 0.3–1.2)
Total Protein: 6.3 g/dL — ABNORMAL LOW (ref 6.5–8.1)

## 2019-05-07 LAB — CBC
HCT: 34.8 % — ABNORMAL LOW (ref 39.0–52.0)
Hemoglobin: 10.5 g/dL — ABNORMAL LOW (ref 13.0–17.0)
MCH: 20.5 pg — ABNORMAL LOW (ref 26.0–34.0)
MCHC: 30.2 g/dL (ref 30.0–36.0)
MCV: 68 fL — ABNORMAL LOW (ref 80.0–100.0)
Platelets: 184 10*3/uL (ref 150–400)
RBC: 5.12 MIL/uL (ref 4.22–5.81)
RDW: 17.4 % — ABNORMAL HIGH (ref 11.5–15.5)
WBC: 4.7 10*3/uL (ref 4.0–10.5)
nRBC: 0 % (ref 0.0–0.2)

## 2019-05-07 LAB — GLUCOSE, CAPILLARY
Glucose-Capillary: 107 mg/dL — ABNORMAL HIGH (ref 70–99)
Glucose-Capillary: 139 mg/dL — ABNORMAL HIGH (ref 70–99)
Glucose-Capillary: 148 mg/dL — ABNORMAL HIGH (ref 70–99)
Glucose-Capillary: 163 mg/dL — ABNORMAL HIGH (ref 70–99)
Glucose-Capillary: 87 mg/dL (ref 70–99)
Glucose-Capillary: 91 mg/dL (ref 70–99)

## 2019-05-07 LAB — CORTISOL: Cortisol, Plasma: 17.7 ug/dL

## 2019-05-07 MED ORDER — PHENOL 1.4 % MT LIQD: 1.0000 | OROMUCOSAL | Status: DC | PRN

## 2019-05-07 MED ORDER — FUROSEMIDE 40 MG PO TABS
40.0000 mg | ORAL_TABLET | Freq: Every day | ORAL | Status: DC
  Administered 2019-05-07 – 2019-05-08 (×2): 40 mg via ORAL
  Filled 2019-05-07 (×2): qty 1

## 2019-05-07 MED ORDER — FLUTICASONE PROPIONATE 50 MCG/ACT NA SUSP
1.0000 | Freq: Every day | NASAL | Status: DC | PRN
  Administered 2019-05-07: 10:00:00 1 via NASAL
  Filled 2019-05-07: qty 16

## 2019-05-07 MED ORDER — DEXTROSE 10 % IV SOLN
INTRAVENOUS | Status: DC
  Administered 2019-05-07: 05:00:00 via INTRAVENOUS

## 2019-05-07 MED ORDER — MENTHOL 3 MG MT LOZG: 1.0000 | LOZENGE | OROMUCOSAL | Status: DC | PRN

## 2019-05-07 MED ORDER — AMLODIPINE BESYLATE 5 MG PO TABS
5.0000 mg | ORAL_TABLET | Freq: Every day | ORAL | Status: DC
  Administered 2019-05-07 – 2019-05-09 (×3): 5 mg via ORAL
  Filled 2019-05-07 (×3): qty 1

## 2019-05-07 MED ORDER — LOSARTAN POTASSIUM 50 MG PO TABS
50.0000 mg | ORAL_TABLET | Freq: Every day | ORAL | Status: DC
  Administered 2019-05-07 – 2019-05-08 (×2): 50 mg via ORAL
  Filled 2019-05-07 (×2): qty 1

## 2019-05-07 NOTE — Progress Notes (Addendum)
PROGRESS NOTE    [name]  PJK:[id number] DOB: November 25, 1950 DOA: 05/05/2019 PCP: [name], MD   Brief Narrative: 68 y.o.malewith medical history significant ofhypertension, hyperlipidemia, diabetes mellitus, prostate cancer (s/p ofprostatectomy 2000), GERD, CKD stage III,adrenal insufficiency, sarcoidosis, small bowel obstruction, who presents with nausea,vomiting, abdominal pain located in the middle abdomen, moderate, constant, nonradiating. HE had nausea andvomiting. x5  NBNB .Last bowel movement was 9/20 am > denied fever or chills. In ER ,Patient was found to have bradycardia with heart rates down to 25, thenimproved to30-40s, then 70s-80s.KG showed complete AV block. Hehad some dizziness which has resolved. No fall. Pt hasleft foot ulcer which has been going on for more than 1 year. Patient is following up with Dr. [name]. He wasstartedondoxycycline on 9/15. He issupposed to take doxycycline until 9/29.  labs-WBC 3.6, COVID-19 negative 9/21, stable renal function, temperature normal, blood pressure 187/70, 167/68, oxygen saturation 98% on room air. Chest x-ray showed borderline cardiomegaly. CT abdomen/pelvis showed small bowel obstruction.Patient is admitted to stepdown bed as inpatient. Cardiology, Dr. Lynnea Ferrier general surgery, Dr. Carrie Mew consulted.  Subjective: NG tube came out.  However x-ray shows contrast in the colon suggesting resolution of small bowel obstruction.  Patient complains of sore throat nasal blockage dry cough. No acute events overnight.  Assessment & Plan:  Small bowel obstruction:  To have resolved.  He started on clear liquid diet as per surgery.  Appreciate input.   D/c ivf  Complete heart block seen in the ER cardiology consulted already following closely.  At this time suspecting due to increased vagal tone in the setting of SBO.  SBO resolved.  Cardiology plans to follow-up with outpatient advised to hold off avoid  nodal blocking agent and no plan for pacemaker placement at this time.  Per cardiology "stress test at [facility] 09/2018 that revealed LVEF 63% with diaphragmatic attenuation and no ischemia.  Echo showed LVEF 60-65% with aortic valve sclerosis and mild AR.  There was moderate LVH but it was otherwise unremarkable".  No plan to repeat echo, Lasix  losartan amlodipine has been resumed  Leg swelling left more than right, patient resumed back on Lasix.  EF was normal. Stop IV fluids as he is now on clear liquid diet.  Sarcoidosis seen by [facility] pulmonology without specific treatment and has been asymptomatic  CKD Stage III baseline creatinine 1.6-1.8, stable. Recent Labs  Lab 05/05/19 2130 05/06/19 0520 05/07/19 0316  BUN 23 20 21   CREATININE 1.69* 1.63* 1.82*    Microcytic anemia.  Stable hb- monitor  Diabetic foot ulcer with infection,Chronic left foor wound followed by Dr [name] and wound care.   And by podiatry here.  Wound culture growing Strep agalactiae cont wound care. Neg for dvt seen by Dr. March Rummage this morning, personally discussed with him, seems to be healing well no concern for infection.  Follow-up outpatient podiatry recommendation.  He has been on doxycycline.  Retinopathy from DM: Has chronic blurring of vision.sees spots on left eye.Sees ophthalmology at [facility]. Reports he is visually impaired.  He has made an appointment to see ophthalmology soon for his upcoming.  Diabetes mellitus, last hemoglobin A1c 10.39/21.  With hypoglycemia likely from n.p.o. and insulin - was placed on low-dose dextrose.  Stop NovoLog 70/30. Monitor Accu-Chek and wean off IV fluids once he is tolerating diet.. Recent Labs  Lab 05/06/19 2110 05/07/19 0009 05/07/19 0436 05/07/19 0834 05/07/19 1241  GLUCAP 117* 91 87 107* 139*   Hypertension:  Resuming home  losartan amlodipine and Lasix   History of adrenal insufficiency: Cortisol level was stable and appropriately up at 25 and  repeat at 49.  On Solu-Cortef 50 twice daily stopping  Body mass index is 27.62 kg/m.   DVT prophylaxis: add lovenox Code Status: full Family Communication: plan of care discussed with patient in detail. Will update family Disposition Plan: Remains inpatient pending improvement in his small bowel obstruction and tolerance of oral diet anticipate discharge in 24 hours once tolerates diet   Consultants:  Cardiology General surgery  Procedures: nG-tube  Microbiology:  Antimicrobials: Anti-infectives (From admission, onward)   Start     Dose/Rate Route Frequency Ordered Stop   05/06/19 0230  doxycycline (VIBRAMYCIN) 100 mg in sodium chloride 0.9 % 250 mL IVPB     100 mg 125 mL/hr over 120 Minutes Intravenous Every 12 hours 05/06/19 0212     05/06/19 0215  doxycycline (VIBRAMYCIN) 200 mg in dextrose 5 % 250 mL IVPB  Status:  Discontinued     200 mg 125 mL/hr over 120 Minutes Intravenous Every 12 hours 05/06/19 0212 05/06/19 0212       Objective: Vitals:   05/07/19 0417 05/07/19 0832 05/07/19 1242 05/07/19 1506  BP: (!) 172/67 (!) 171/73 (!) 190/85 (!) 169/73  Pulse: 76 73 73 77  Resp: 14 15 16    Temp: 98.7 F (37.1 C) 98.2 F (36.8 C) 98.1 F (36.7 C)   TempSrc: Oral  Oral   SpO2: 100% 99% 100% 100%  Weight:      Height:        Intake/Output Summary (Last 24 hours) at 05/07/2019 1542 Last data filed at 05/07/2019 0700 Gross per 24 hour  Intake 1638.39 ml  Output 200 ml  Net 1438.39 ml   Filed Weights   05/05/19 2121  Weight: 80 kg   Weight change:   Body mass index is 27.62 kg/m.  Intake/Output from previous day: 09/21 0701 - 09/22 0700 In: 2368.4 [I.V.:1195.7; IV Piggyback:1172.7] Out: 200 [Emesis/NG output:200] Intake/Output this shift: No intake/output data recorded.  Examination: General exam: Calm, comfortable, not in acute distress, older for age, average built.  HEENT:Oral mucosa moist, Ear/Nose WNL grossly, dentition normal. Respiratory  system: Bilateral equal air entry, no crackles and wheezing, no use of accessory muscle, non tender on palpation. Cardiovascular system: regular rate and rhythm, S1 & S2 heard, No JVD/murmurs. Gastrointestinal system: Abdomen soft, non-tender, non-distended, BS +.  Nervous System:Alert, awake and oriented at baseline. Able to move UE and LE, sensation intact. Extremities: No edema, distal peripheral pulses palpable.  Skin: No rashes,no icterus. MSK: Normal muscle bulk,tone, power.  Chronic wound on his left foot  Medications:  Scheduled Meds:  amLODipine  5 mg Oral Daily   aspirin  81 mg Oral q morning - 10a   atorvastatin  20 mg Oral q morning - 10a   furosemide  40 mg Oral Daily   gabapentin  300 mg Oral TID   hydrocortisone sod succinate (SOLU-CORTEF) inj  50 mg Intravenous Daily   insulin aspart  0-9 Units Subcutaneous TID WC   insulin aspart protamine- aspart  8 Units Subcutaneous BID WC   losartan  50 mg Oral Daily   Continuous Infusions:  sodium chloride 75 mL/hr at 05/06/19 1900   dextrose 25 mL/hr at 05/07/19 0515   doxycycline (VIBRAMYCIN) IV 100 mg (05/07/19 0527)    Data Reviewed: I have personally reviewed following labs and imaging studies  CBC: Recent Labs  Lab 05/05/19 2130 05/06/19 0520  05/07/19 0316  WBC 3.6* 4.7 4.7  NEUTROABS 1.9  --   --   HGB 10.9* 10.5* 10.5*  HCT 34.8* 35.2* 34.8*  MCV 67.7* 67.7* 68.0*  PLT 202 210 161   Basic Metabolic Panel: Recent Labs  Lab 05/05/19 2130 05/06/19 0520 05/07/19 0316  NA 133* 136 141  K 4.2 4.8 4.1  CL 102 103 110  CO2 23 24 21*  GLUCOSE 127* 143* 86  BUN 23 20 21   CREATININE 1.69* 1.63* 1.82*  CALCIUM 8.8* 8.8* 8.9  MG 1.9  --   --    GFR: Estimated Creatinine Clearance: 38.8 mL/min (A) (by C-G formula based on SCr of 1.82 mg/dL (H)). Liver Function Tests: Recent Labs  Lab 05/05/19 2130 05/07/19 0316  AST 33 30  ALT 28 22  ALKPHOS 77 59  BILITOT 1.1 1.2  PROT 6.9 6.3*   ALBUMIN 3.4* 3.2*   Recent Labs  Lab 05/05/19 2130  LIPASE 19   No results for input(s): AMMONIA in the last 168 hours. Coagulation Profile: Recent Labs  Lab 05/06/19 0748  INR 1.2   Cardiac Enzymes: No results for input(s): CKTOTAL, CKMB, CKMBINDEX, TROPONINI in the last 168 hours. BNP (last 3 results) No results for input(s): PROBNP in the last 8760 hours. HbA1C: Recent Labs    05/06/19 0520  HGBA1C 10.3*   CBG: Recent Labs  Lab 05/06/19 2110 05/07/19 0009 05/07/19 0436 05/07/19 0834 05/07/19 1241  GLUCAP 117* 91 87 107* 139*   Lipid Profile: Recent Labs    05/06/19 0520  CHOL 136  HDL 53  LDLCALC 74  TRIG 47  CHOLHDL 2.6   Thyroid Function Tests: Recent Labs    05/06/19 0520  TSH 1.370   Anemia Panel: No results for input(s): VITAMINB12, FOLATE, FERRITIN, TIBC, IRON, RETICCTPCT in the last 72 hours. Sepsis Labs: No results for input(s): PROCALCITON, LATICACIDVEN in the last 168 hours.  Recent Results (from the past 240 hour(s))  WOUND CULTURE     Status: Abnormal   Collection Time: 04/30/19 11:57 AM   Specimen: Wound  Result Value Ref Range Status   MICRO NUMBER: [id number]  Final   SPECIMEN QUALITY: Adequate  Final   SOURCE: WOUND (SITE NOT SPECIFIED)  Final   STATUS: ADDENDUM - FINAL  Corrected   GRAM STAIN: Gram positive cocci in chains  Final    Comment: No white blood cells seen No epithelial cells seen Moderate Gram positive cocci in chains Few Gram positive bacilli   ISOLATE 1: Streptococcus agalactiae (A)  Corrected    Comment: Heavy growth of Group B Streptococcus isolated      Susceptibility   Streptococcus agalactiae - AEROBIC CULT, GRAM STAIN POSITIVE 1    AMPICILLIN <=0.25 Sensitive     CEFOTAXIME <=0.12 Sensitive     CEFTRIAXONE <=0.12 Sensitive     VANCOMYCIN 0.5 Sensitive     CLINDAMYCIN <=0.25 Sensitive     LEVOFLOXACIN 1 Sensitive     PENO - penicillin* 0.12 Sensitive      * Legend:S = Susceptible  I = IntermediateR =  Resistant  NS = Not susceptible* = Not tested  NR = Not reported**NN = See antimicrobic comments  SARS CORONAVIRUS 2 (TAT 6-24 HRS) Nasopharyngeal Nasopharyngeal Swab     Status: None   Collection Time: 05/05/19 10:47 PM   Specimen: Nasopharyngeal Swab  Result Value Ref Range Status   SARS Coronavirus 2 NEGATIVE NEGATIVE Final    Comment: (NOTE) SARS-CoV-2 target nucleic acids are NOT  DETECTED. The SARS-CoV-2 RNA is generally detectable in upper and lower respiratory specimens during the acute phase of infection. Negative results do not preclude SARS-CoV-2 infection, do not rule out co-infections with other pathogens, and should not be used as the sole basis for treatment or other patient management decisions. Negative results must be combined with clinical observations, patient history, and epidemiological information. The expected result is Negative. Fact Sheet for Patients: SugarRoll.be Fact Sheet for Healthcare Providers: https://www.woods-mathews.com/ This test is not yet approved or cleared by the [place] FDA and  has been authorized for detection and/or diagnosis of SARS-CoV-2 by FDA under an Emergency Use Authorization (EUA). This EUA will remain  in effect (meaning this test can be used) for the duration of the COVID-19 declaration under Section 56 4(b)(1) of the Act, 21 U.S.C. section 360bbb-3(b)(1), unless the authorization is terminated or revoked sooner. Performed at [facility], [place] [address], [place], [place] [address]   Culture, blood (Routine X 2) w Reflex to ID Panel     Status: None (Preliminary result)   Collection Time: 05/06/19  5:00 AM   Specimen: BLOOD LEFT ARM  Result Value Ref Range Status   Specimen Description BLOOD LEFT ARM  Final   Special Requests   Final    BOTTLES DRAWN AEROBIC AND ANAEROBIC Blood Culture results may not be optimal due to an excessive volume of blood received in culture bottles    Culture   Final    NO GROWTH 1 DAY Performed at [facility], [place] [address], [place], [place] [address]    Report Status PENDING  Incomplete  Culture, blood (Routine X 2) w Reflex to ID Panel     Status: None (Preliminary result)   Collection Time: 05/06/19  5:20 AM   Specimen: BLOOD LEFT HAND  Result Value Ref Range Status   Specimen Description BLOOD LEFT HAND  Final   Special Requests   Final    BOTTLES DRAWN AEROBIC ONLY Blood Culture results may not be optimal due to an excessive volume of blood received in culture bottles   Culture   Final    NO GROWTH 1 DAY Performed at [facility], [place] [address] [place], [place] [address]    Report Status PENDING  Incomplete      Radiology Studies: Dg Abd 1 View  Result Date: 05/06/2019 CLINICAL DATA:  NG tube placement. EXAM: ABDOMEN - 1 VIEW COMPARISON:  CT yesterday. FINDINGS: Tip of the enteric tube below the diaphragm in the stomach, side-port in the region of the gastroesophageal junction. Dilated small bowel on prior CT not as well visualized radiographically. Stool within the colon. Excreted IV contrast in the urinary bladder from prior CT. Multiple surgical clips in the pelvis. Unchanged osseous structures. IMPRESSION: 1. Tip of the enteric tube below the diaphragm in the stomach, side-port in the region of the gastroesophageal junction. Advancement of 3 cm would lead to optimal positioning. 2. Dilated small bowel on prior CT not as well visualized radiographically. Electronically Signed   By: Keith Rake M.D.   On: 05/06/2019 03:58   Ct Abdomen Pelvis W Contrast  Result Date: 05/05/2019 CLINICAL DATA:  Initial evaluation for acute nausea, vomiting, abdominal pain. History of previous SBO. EXAM: CT ABDOMEN AND PELVIS WITH CONTRAST TECHNIQUE: Multidetector CT imaging of the abdomen and pelvis was performed using the standard protocol following bolus administration of intravenous contrast. CONTRAST:  165mL OMNIPAQUE  IOHEXOL 300 MG/ML  SOLN COMPARISON:  Prior CT from 12/29/2016. FINDINGS: Lower chest: Mild subsegmental atelectatic changes noted within the visualized lung bases. 9 mm nodular density along the left hemidiaphragm is little interval change as compared to previous study from 2018, most likely a benign finding. Visualized lungs are otherwise clear. Hepatobiliary: Liver demonstrates a normal contrast enhanced appearance. Gallbladder within normal limits. No biliary dilatation. Pancreas: Subcentimeter cystic lesion within the mid pancreatic body noted, grossly stable from previous, likely benign. Pancreas otherwise unremarkable. No pancreatic ductal dilatation or acute peripancreatic inflammation. Spleen: Spleen within normal limits. Adrenals/Urinary Tract: Adrenal glands are normal. Kidneys equal in size with symmetric enhancement. 18 mm cyst noted at the upper pole of the right kidney, similar to previous. No nephrolithiasis, hydronephrosis, or focal enhancing renal mass. No visible hydroureter. Partially distended bladder within normal limits. Stomach/Bowel: Stomach within normal limits. Multiple mildly dilated fluid-filled loops of bowel seen clustered within the upper left and mid abdomen. Focal transition point within the mid abdomen present (series 3, image 28). Small bowel is decompressed distally. Finding consistent with acute mechanical small bowel obstruction. Underlying adhesive disease is suspected. No associated pneumatosis or portal venous gas. No other acute inflammatory changes about the small bowel. Moderate retained stool seen diffusely throughout the colon, suggesting constipation. Appendix within normal limits. Vascular/Lymphatic: Normal intravascular enhancement seen throughout the intra-abdominal aorta. Mesenteric vessels patent proximally. Mild aorto bi-iliac atherosclerotic disease. No pathologically enlarged intra-abdominopelvic lymph nodes. Mildly prominent bilateral inguinal lymph nodes  measure up to 13 mm in short axis, within normal limits. Reproductive: Prior prostatectomy. Other: No free air or fluid. Musculoskeletal: No acute osseous abnormality. No discrete lytic or blastic osseous lesions. Moderate degenerative spondylolysis present at L3-4 through L5-S1. Osteoarthritic changes noted about the hips bilaterally. IMPRESSION: 1. Findings consistent with acute mechanical small bowel obstruction with transition point within the mid abdomen as above. 2. No other acute intra-abdominal or pelvic process. 3. Moderate retained stool diffusely throughout the colon, suggesting constipation. 4. Prior prostatectomy. Electronically Signed   By: Jeannine Boga M.D.   On: 05/05/2019 23:33   Dg Chest Portable 1 View  Result Date: 05/05/2019 CLINICAL DATA:  Abdominal pain. Vomiting. EXAM: PORTABLE CHEST 1 VIEW COMPARISON:  10/19/2018 FINDINGS: Borderline cardiomegaly with unchanged mediastinal contours. Pulmonary vasculature is normal. No consolidation, pleural effusion, or pneumothorax. No acute osseous abnormalities are seen. IMPRESSION: Borderline cardiomegaly.  No acute pulmonary process. Electronically Signed   By: Keith Rake M.D.   On: 05/05/2019 22:16   Dg Abd Portable 1v  Result Date: 05/07/2019 CLINICAL DATA:  NG tube placement. EXAM: PORTABLE ABDOMEN - 1 VIEW COMPARISON:  05/06/2019.  CT 05/05/2019. FINDINGS: No NG tube noted. Oral contrast noted throughout the colon. No bowel distention. No free air. Pelvic clips are noted. IMPRESSION: No NG tube noted. Oral contrast noted throughout the colon. No bowel distention. No free air. Electronically Signed   By: Marcello Moores  Register   On: 05/07/2019 06:57   Dg Abd Portable 1v-small Bowel Obstruction Protocol-initial, 8 Hr Delay  Result Date: 05/06/2019 CLINICAL DATA:  Small-bowel obstruction. 8 hour delayed examination. EXAM: PORTABLE ABDOMEN - 1 VIEW COMPARISON:  Earlier same day; CT abdomen pelvis-05/05/2019 FINDINGS: Interval transit  of ingested contrast, now seen throughout the colon to the level of the rectum. A small amount of contrast remains within the terminal ileum. Enteric tube tip and side port projected the expected location of the gastric fundus. There is no significant gaseous distension of the upstream small bowel. No supine evidence of pneumoperitoneum. No pneumatosis or  portal venous gas Limited visualization of lower thorax demonstrates an enlarged cardiac silhouette. No acute or aggressive osseous abnormalities. Severe degenerative change the right hip is suspected though incompletely evaluated. Post prostatectomy. IMPRESSION: 1. Enteric contrast now seen throughout the colon to the level the rectum. No evidence of enteric obstruction. 2. Enteric tube tip and side port project over the expected location of the gastric fundus. Electronically Signed   By: Sandi Mariscal M.D.   On: 05/06/2019 19:37   Dg Abd Portable 1v-small Bowel Protocol-position Verification  Result Date: 05/06/2019 CLINICAL DATA:  NG tube placement. Small bowel obstruction. EXAM: PORTABLE ABDOMEN - 1 VIEW 10:45 a.m. COMPARISON:  05/06/2019 at 3:40 a.m. FINDINGS: NG tube tip is in the body of the stomach. Side hole is near the gastroesophageal junction as previously described. The visualized bowel gas pattern is normal. IMPRESSION: No significant change in the position of the nasogastric tube. The side hole is near the gastroesophageal junction. Electronically Signed   By: Lorriane Shire M.D.   On: 05/06/2019 10:59   Vas [place] Lower Extremity Venous (dvt)  Result Date: 05/06/2019  Lower Venous Study Indications: Swelling, and Pain.  Limitations: Body habitus, poor ultrasound/tissue interface and bandages. Comparison Study: 10/19/2018- negative left lower extremity venous duplex. Performing Technologist: Maudry Mayhew MHA, RDMS, RVT, RDCS  Examination Guidelines: A complete evaluation includes B-mode imaging, spectral Doppler, color Doppler, and power  Doppler as needed of all accessible portions of each vessel. Bilateral testing is considered an integral part of a complete examination. Limited examinations for reoccurring indications may be performed as noted.  Right CFV not visualized.  +---------+---------------+---------+-----------+----------+--------------+  LEFT      Compressibility Phasicity Spontaneity Properties Thrombus Aging  +---------+---------------+---------+-----------+----------+--------------+  CFV       Full            No        Yes                                    +---------+---------------+---------+-----------+----------+--------------+  SFJ       Full                                                             +---------+---------------+---------+-----------+----------+--------------+  FV Prox   Full                                                             +---------+---------------+---------+-----------+----------+--------------+  FV Mid    Full                                                             +---------+---------------+---------+-----------+----------+--------------+  FV Distal Full                                                             +---------+---------------+---------+-----------+----------+--------------+  PFV       Full                                                             +---------+---------------+---------+-----------+----------+--------------+  POP       Full            Yes       Yes                                    +---------+---------------+---------+-----------+----------+--------------+  PTV       Full                      Yes                                    +---------+---------------+---------+-----------+----------+--------------+ Unable to adequately visualize the left peroneal veins. Limited visualization of the left posterior tibial veins.  Summary: Left: There is no evidence of deep vein thrombosis in the lower extremity. However, portions of this examination were limited- see  technologist comments above. No cystic structure found in the popliteal fossa. Ultrasound characteristics of enlarged lymph  nodes noted in the groin.  *See table(s) above for measurements and observations. Electronically signed by Monica Martinez MD on 05/06/2019 at 4:29:48 PM.    Final       LOS: 1 day   Time spent: More than 50% of that time was spent in counseling and/or coordination of care.  Antonieta Pert, MD [facility]  05/07/2019, 3:42 PM

## 2019-05-07 NOTE — Consult Note (Signed)
Reason for Consult: Left foot ulcer Referring Physician: Dr. Lyn Hollingshead is an 68 y.o. male.  HPI: 68 year old male with chronic left foot ulcer admitted for small bowel obstruction. Previously seen by Dr. [name], recently seen in writer's office. Patient requested to be seen by a provider from the practice. Stated he did not want to see Dr. [name]. Denies significant changes to the left foot wound.  Past Medical History:  Diagnosis Date  . Cellulitis and abscess of face 10/11/2007   Qualifier: Diagnosis of  By: Amil Amen MD, Benjamine Mola    . Diabetic retinopathy ([place])   . Diabetic retinopathy associated with type 2 diabetes mellitus ([place]) 09/13/2014   He works for [employer]  . Fluid overload 09/03/2012   Post op  . GERD (gastroesophageal reflux disease)   . Visual impairment     Past Surgical History:  Procedure Laterality Date  . CATARACT EXTRACTION W/ INTRAOCULAR LENS  IMPLANT, BILATERAL    . EYE SURGERY Bilateral    "laser OR for diabetic retinopathy"  . INGUINAL HERNIA REPAIR     [name] 07/12/2010), "don't remember which side"  . LAPAROTOMY  08/23/2012   Procedure: EXPLORATORY LAPAROTOMY;  Surgeon: [name], DO;  Location: [facility];  Service: General;  Laterality: N/A;  exploratory laparotomy with lysis of adhesions  . LYSIS OF ADHESION  08/23/2012   Procedure: LYSIS OF ADHESION;  Surgeon: [name], DO;  Location: [facility];  Service: General;;  . ROBOT ASSISTED LAPAROSCOPIC RADICAL PROSTATECTOMY  2000's   "had to finish manually after machine broke"  . SHOULDER SURGERY  1970's   separation; from playing football"    Family History  Problem Relation Age of Onset  . Diabetes Mellitus II Mother   . Colon cancer Neg Hx     Social History:  reports that he quit smoking about 52 years ago. His smoking use included cigarettes. He has a 7.50 pack-year smoking history. He has never used smokeless tobacco. He reports current alcohol use. He reports that he does not  use drugs.  Allergies:  Allergies  Allergen Reactions  . Ace Inhibitors Itching and Cough  . Naproxen Anaphylaxis, Shortness Of Breath and Other (See Comments)    Throat swells. cannot breathe, and causes GI distress    Medications: I have reviewed the patient's current medications.  Results for orders placed or performed during the hospital encounter of 05/05/19 (from the past 48 hour(s))  Troponin I (High Sensitivity)     Status: None   Collection Time: 05/06/19  2:20 AM  Result Value Ref Range   Troponin I (High Sensitivity) 9 <18 ng/L    Comment: (NOTE) Elevated high sensitivity troponin I (hsTnI) values and significant  changes across serial measurements may suggest ACS but many other  chronic and acute conditions are known to elevate hsTnI results.  Refer to the "Links" section for chest pain algorithms and additional  guidance. Performed at [facility], [place] [address], [place], [place] [address]   Cortisol-am, blood     Status: Abnormal   Collection Time: 05/06/19  2:20 AM  Result Value Ref Range   Cortisol - AM 25.6 (H) 6.7 - 22.6 ug/dL    Comment: Performed at [facility] [address], [place], [place] [address]  Culture, blood (Routine X 2) w Reflex to ID Panel     Status: None (Preliminary result)   Collection Time: 05/06/19  5:00 AM   Specimen: BLOOD  LEFT ARM  Result Value Ref Range   Specimen Description BLOOD LEFT ARM    Special Requests      BOTTLES DRAWN AEROBIC AND ANAEROBIC Blood Culture results may not be optimal due to an excessive volume of blood received in culture bottles   Culture      NO GROWTH 1 DAY Performed at [facility] [address], [place], [place] [address]    Report Status PENDING   Cortisol-am, blood     Status: Abnormal   Collection Time: 05/06/19  5:20 AM  Result Value Ref Range   Cortisol - AM 49.1 (H) 6.7 - 22.6 ug/dL    Comment: Performed at [facility] [address], [place], [place] [address]   Troponin I (High Sensitivity)     Status: None   Collection Time: 05/06/19  5:20 AM  Result Value Ref Range   Troponin I (High Sensitivity) 12 <18 ng/L    Comment: (NOTE) Elevated high sensitivity troponin I (hsTnI) values and significant  changes across serial measurements may suggest ACS but many other  chronic and acute conditions are known to elevate hsTnI results.  Refer to the "Links" section for chest pain algorithms and additional  guidance. Performed at [facility], [place] [address], [place], [place] [address]   Brain natriuretic peptide     Status: Abnormal   Collection Time: 05/06/19  5:20 AM  Result Value Ref Range   B Natriuretic Peptide 133.6 (H) 0.0 - 100.0 pg/mL    Comment: Performed at [facility] [address], [place], [place] [address]  Hemoglobin A1c     Status: Abnormal   Collection Time: 05/06/19  5:20 AM  Result Value Ref Range   Hgb A1c MFr Bld 10.3 (H) 4.8 - 5.6 %    Comment: (NOTE) Pre diabetes:          5.7%-6.4% Diabetes:              >6.4% Glycemic control for   <7.0% adults with diabetes    Mean Plasma Glucose 248.91 mg/dL    Comment: Performed at [facility] [address], [place], [place] [address]  Lipid panel     Status: None   Collection Time: 05/06/19  5:20 AM  Result Value Ref Range   Cholesterol 136 0 - 200 mg/dL   Triglycerides 47 <150 mg/dL   HDL 53 >40 mg/dL   Total CHOL/HDL Ratio 2.6 RATIO   VLDL 9 0 - 40 mg/dL   LDL Cholesterol 74 0 - 99 mg/dL    Comment:        Total Cholesterol/HDL:CHD Risk Coronary Heart Disease Risk Table                     Men   Women  1/2 Average Risk   3.4   3.3  Average Risk       5.0   4.4  2 X Average Risk   9.6   7.1  3 X Average Risk  23.4   11.0        Use the calculated Patient Ratio above and the CHD Risk Table to determine the patient's CHD Risk.        ATP III CLASSIFICATION (LDL):  <100     mg/dL   Optimal  100-129  mg/dL   Near or Above  Optimal  130-159  mg/dL   Borderline  160-189  mg/dL   High  >190     mg/dL   Very High Performed at [facility] [address], [place], [place] [address]   TSH     Status: None   Collection Time: 05/06/19  5:20 AM  Result Value Ref Range   TSH 1.370 0.350 - 4.500 uIU/mL    Comment: Performed by a 3rd Generation assay with a functional sensitivity of <=0.01 uIU/mL. Performed at [facility], [place] [address], [place], [place] [address]   Basic metabolic panel     Status: Abnormal   Collection Time: 05/06/19  5:20 AM  Result Value Ref Range   Sodium 136 135 - 145 mmol/L   Potassium 4.8 3.5 - 5.1 mmol/L   Chloride 103 98 - 111 mmol/L   CO2 24 22 - 32 mmol/L   Glucose, Bld 143 (H) 70 - 99 mg/dL   BUN 20 8 - 23 mg/dL   Creatinine, Ser 1.63 (H) 0.61 - 1.24 mg/dL   Calcium 8.8 (L) 8.9 - 10.3 mg/dL   GFR calc non Af Amer 42 (L) >60 mL/min   GFR calc Af Amer 49 (L) >60 mL/min   Anion gap 9 5 - 15    Comment: Performed at [facility] [address], [place], [place] [address]  CBC     Status: Abnormal   Collection Time: 05/06/19  5:20 AM  Result Value Ref Range   WBC 4.7 4.0 - 10.5 K/uL   RBC 5.20 4.22 - 5.81 MIL/uL   Hemoglobin 10.5 (L) 13.0 - 17.0 g/dL   HCT 35.2 (L) 39.0 - 52.0 %   MCV 67.7 (L) 80.0 - 100.0 fL   MCH 20.2 (L) 26.0 - 34.0 pg   MCHC 29.8 (L) 30.0 - 36.0 g/dL   RDW 17.3 (H) 11.5 - 15.5 %   Platelets 210 150 - 400 K/uL    Comment: REPEATED TO VERIFY   nRBC 0.0 0.0 - 0.2 %    Comment: Performed at [facility], [place] [address] [place], [place] [address]  Culture, blood (Routine X 2) w Reflex to ID Panel     Status: None (Preliminary result)   Collection Time: 05/06/19  5:20 AM   Specimen: BLOOD LEFT HAND  Result Value Ref Range   Specimen Description BLOOD LEFT HAND    Special Requests      BOTTLES DRAWN AEROBIC ONLY Blood Culture results may not be optimal due to an excessive volume of blood received in culture bottles   Culture       NO GROWTH 1 DAY Performed at [facility] [address], [place], [place] [address]    Report Status PENDING   Protime-INR     Status: None   Collection Time: 05/06/19  7:48 AM  Result Value Ref Range   Prothrombin Time 14.7 11.4 - 15.2 seconds   INR 1.2 0.8 - 1.2    Comment: (NOTE) INR goal varies based on device and disease states. Performed at [facility], [place] [address], [place], [place] [address]   APTT     Status: None   Collection Time: 05/06/19  7:48 AM  Result Value Ref Range   aPTT 30 24 - 36 seconds    Comment: Performed at [facility] [address] [place], [place] [address]  Type and screen [place]  Status: None   Collection Time: 05/06/19  7:48 AM  Result Value Ref Range   ABO/RH(D) O POS    Antibody Screen NEG    Sample Expiration      05/09/2019,2359 Performed at [facility], [place] [address], [place], [place] [address]   ABO/Rh     Status: None   Collection Time: 05/06/19  7:48 AM  Result Value Ref Range   ABO/RH(D)      O POS Performed at [facility] [address] [place], [place] [address]   CBG monitoring, ED     Status: Abnormal   Collection Time: 05/06/19  9:30 AM  Result Value Ref Range   Glucose-Capillary 156 (H) 70 - 99 mg/dL  CBG monitoring, ED     Status: None   Collection Time: 05/06/19  1:08 PM  Result Value Ref Range   Glucose-Capillary 91 70 - 99 mg/dL  Glucose, capillary     Status: Abnormal   Collection Time: 05/06/19  6:56 PM  Result Value Ref Range   Glucose-Capillary 63 (L) 70 - 99 mg/dL  Glucose, capillary     Status: Abnormal   Collection Time: 05/06/19  8:04 PM  Result Value Ref Range   Glucose-Capillary 68 (L) 70 - 99 mg/dL  Glucose, capillary     Status: Abnormal   Collection Time: 05/06/19  9:10 PM  Result Value Ref Range   Glucose-Capillary 117 (H) 70 - 99 mg/dL  Glucose, capillary     Status: None   Collection Time: 05/07/19 12:09 AM  Result Value Ref  Range   Glucose-Capillary 91 70 - 99 mg/dL  Comprehensive metabolic panel     Status: Abnormal   Collection Time: 05/07/19  3:16 AM  Result Value Ref Range   Sodium 141 135 - 145 mmol/L   Potassium 4.1 3.5 - 5.1 mmol/L   Chloride 110 98 - 111 mmol/L   CO2 21 (L) 22 - 32 mmol/L   Glucose, Bld 86 70 - 99 mg/dL   BUN 21 8 - 23 mg/dL   Creatinine, Ser 1.82 (H) 0.61 - 1.24 mg/dL   Calcium 8.9 8.9 - 10.3 mg/dL   Total Protein 6.3 (L) 6.5 - 8.1 g/dL   Albumin 3.2 (L) 3.5 - 5.0 g/dL   AST 30 15 - 41 U/L   ALT 22 0 - 44 U/L   Alkaline Phosphatase 59 38 - 126 U/L   Total Bilirubin 1.2 0.3 - 1.2 mg/dL   GFR calc non Af Amer 37 (L) >60 mL/min   GFR calc Af Amer 43 (L) >60 mL/min   Anion gap 10 5 - 15    Comment: Performed at [facility], [address], [place], [place] [address]  CBC     Status: Abnormal   Collection Time: 05/07/19  3:16 AM  Result Value Ref Range   WBC 4.7 4.0 - 10.5 K/uL   RBC 5.12 4.22 - 5.81 MIL/uL   Hemoglobin 10.5 (L) 13.0 - 17.0 g/dL   HCT 34.8 (L) 39.0 - 52.0 %   MCV 68.0 (L) 80.0 - 100.0 fL   MCH 20.5 (L) 26.0 - 34.0 pg   MCHC 30.2 30.0 - 36.0 g/dL   RDW 17.4 (H) 11.5 - 15.5 %   Platelets 184 150 - 400 K/uL    Comment: REPEATED TO VERIFY   nRBC 0.0 0.0 - 0.2 %    Comment: Performed at [facility], [place],  [place] [address]  Glucose, capillary     Status: None   Collection Time: 05/07/19  4:36 AM  Result Value Ref Range   Glucose-Capillary 87 70 - 99 mg/dL  Glucose, capillary     Status: Abnormal   Collection Time: 05/07/19  8:34 AM  Result Value Ref Range   Glucose-Capillary 107 (H) 70 - 99 mg/dL  Cortisol     Status: None   Collection Time: 05/07/19  9:07 AM  Result Value Ref Range   Cortisol, Plasma 17.7 ug/dL    Comment: (NOTE) AM    6.7 - 22.6 ug/dL PM   <10.0       ug/dL Performed at [facility] [address] [place], [place] [address]   Glucose, capillary     Status: Abnormal   Collection Time:  05/07/19 12:41 PM  Result Value Ref Range   Glucose-Capillary 139 (H) 70 - 99 mg/dL  Glucose, capillary     Status: Abnormal   Collection Time: 05/07/19  4:39 PM  Result Value Ref Range   Glucose-Capillary 148 (H) 70 - 99 mg/dL  Glucose, capillary     Status: Abnormal   Collection Time: 05/07/19  8:06 PM  Result Value Ref Range   Glucose-Capillary 163 (H) 70 - 99 mg/dL    Dg Abd 1 View  Result Date: 05/06/2019 CLINICAL DATA:  NG tube placement. EXAM: ABDOMEN - 1 VIEW COMPARISON:  CT yesterday. FINDINGS: Tip of the enteric tube below the diaphragm in the stomach, side-port in the region of the gastroesophageal junction. Dilated small bowel on prior CT not as well visualized radiographically. Stool within the colon. Excreted IV contrast in the urinary bladder from prior CT. Multiple surgical clips in the pelvis. Unchanged osseous structures. IMPRESSION: 1. Tip of the enteric tube below the diaphragm in the stomach, side-port in the region of the gastroesophageal junction. Advancement of 3 cm would lead to optimal positioning. 2. Dilated small bowel on prior CT not as well visualized radiographically. Electronically Signed   By: Keith Rake M.D.   On: 05/06/2019 03:58   Dg Abd Portable 1v  Result Date: 05/07/2019 CLINICAL DATA:  NG tube placement. EXAM: PORTABLE ABDOMEN - 1 VIEW COMPARISON:  05/06/2019.  CT 05/05/2019. FINDINGS: No NG tube noted. Oral contrast noted throughout the colon. No bowel distention. No free air. Pelvic clips are noted. IMPRESSION: No NG tube noted. Oral contrast noted throughout the colon. No bowel distention. No free air. Electronically Signed   By: Marcello Moores  Register   On: 05/07/2019 06:57   Dg Abd Portable 1v-small Bowel Obstruction Protocol-initial, 8 Hr Delay  Result Date: 05/06/2019 CLINICAL DATA:  Small-bowel obstruction. 8 hour delayed examination. EXAM: PORTABLE ABDOMEN - 1 VIEW COMPARISON:  Earlier same day; CT abdomen pelvis-05/05/2019 FINDINGS: Interval  transit of ingested contrast, now seen throughout the colon to the level of the rectum. A small amount of contrast remains within the terminal ileum. Enteric tube tip and side port projected the expected location of the gastric fundus. There is no significant gaseous distension of the upstream small bowel. No supine evidence of pneumoperitoneum. No pneumatosis or portal venous gas Limited visualization of lower thorax demonstrates an enlarged cardiac silhouette. No acute or aggressive osseous abnormalities. Severe degenerative change the right hip is suspected though incompletely evaluated. Post prostatectomy. IMPRESSION: 1. Enteric contrast now seen throughout the colon to the level the rectum. No evidence of enteric obstruction. 2. Enteric tube tip and side port project over the expected location of  the gastric fundus. Electronically Signed   By: Sandi Mariscal M.D.   On: 05/06/2019 19:37   Dg Abd Portable 1v-small Bowel Protocol-position Verification  Result Date: 05/06/2019 CLINICAL DATA:  NG tube placement. Small bowel obstruction. EXAM: PORTABLE ABDOMEN - 1 VIEW 10:45 a.m. COMPARISON:  05/06/2019 at 3:40 a.m. FINDINGS: NG tube tip is in the body of the stomach. Side hole is near the gastroesophageal junction as previously described. The visualized bowel gas pattern is normal. IMPRESSION: No significant change in the position of the nasogastric tube. The side hole is near the gastroesophageal junction. Electronically Signed   By: Lorriane Shire M.D.   On: 05/06/2019 10:59   Vas [place] Lower Extremity Venous (dvt)  Result Date: 05/06/2019  Lower Venous Study Indications: Swelling, and Pain.  Limitations: Body habitus, poor ultrasound/tissue interface and bandages. Comparison Study: 10/19/2018- negative left lower extremity venous duplex. Performing Technologist: Maudry Mayhew MHA, RDMS, RVT, RDCS  Examination Guidelines: A complete evaluation includes B-mode imaging, spectral Doppler, color Doppler, and  power Doppler as needed of all accessible portions of each vessel. Bilateral testing is considered an integral part of a complete examination. Limited examinations for reoccurring indications may be performed as noted.  Right CFV not visualized.  +---------+---------------+---------+-----------+----------+--------------+ LEFT     CompressibilityPhasicitySpontaneityPropertiesThrombus Aging +---------+---------------+---------+-----------+----------+--------------+ CFV      Full           No       Yes                                 +---------+---------------+---------+-----------+----------+--------------+ SFJ      Full                                                        +---------+---------------+---------+-----------+----------+--------------+ FV Prox  Full                                                        +---------+---------------+---------+-----------+----------+--------------+ FV Mid   Full                                                        +---------+---------------+---------+-----------+----------+--------------+ FV DistalFull                                                        +---------+---------------+---------+-----------+----------+--------------+ PFV      Full                                                        +---------+---------------+---------+-----------+----------+--------------+ POP      Full  Yes      Yes                                 +---------+---------------+---------+-----------+----------+--------------+ PTV      Full                    Yes                                 +---------+---------------+---------+-----------+----------+--------------+ Unable to adequately visualize the left peroneal veins. Limited visualization of the left posterior tibial veins.  Summary: Left: There is no evidence of deep vein thrombosis in the lower extremity. However, portions of this examination were limited- see  technologist comments above. No cystic structure found in the popliteal fossa. Ultrasound characteristics of enlarged lymph  nodes noted in the groin.  *See table(s) above for measurements and observations. Electronically signed by Monica Martinez MD on 05/06/2019 at 4:29:48 PM.    Final     ROS Blood pressure (!) 170/66, pulse 71, temperature 98.1 F (36.7 C), temperature source Oral, resp. rate 14, height 5\' 7"  (1.702 m), weight 80 kg, SpO2 100 %. Physical Exam  Vitals:   05/07/19 1642 05/07/19 2002  BP: (!) 180/79 (!) 170/66  Pulse: 80 71  Resp: 16 14  Temp: 98.2 F (36.8 C) 98.1 F (36.7 C)  SpO2: 100% 100%   General AA&O x3. Normal mood and affect.  Vascular Dorsalis pedis and posterior tibial pulses  present 1+ bilaterally  Capillary refill normal to all digits. Pedal hair growth diminished.  Neurologic Epicritic sensation grossly diminished.  Dermatologic (Wound) Left plantar lateral - sub cuboid - foot wound with overlying hyperkeratosis. No warmth erythema drainage, fluctuance or crepitus. No signs of acute infeciton.  Orthopedic: +motor distally. Rocker bottom charcot deformity left foot   Radiographs: None  Assessment/Plan:  Left foot ulceration, complicated by Charcot. No signs of acute infection. -No acute infection noted. -Dressing applied. Betadine WTD, change PRN strikethrough/soiling. -Benefit from outpatient management.  Evelina Bucy 05/07/2019, 11:56 PM

## 2019-05-07 NOTE — Progress Notes (Signed)
Hypoglycemic Event  CBG: 68  Treatment: D50 25 mL (12.5 gm)  Symptoms: None  Follow-up CBG: Time: 2110 CBG Result:117  Possible Reasons for Event: Inadequate meal intake  Comments/MD notified: Schorr, NP notified of these CBGs and CBG at 0009 of 91.     [name]

## 2019-05-07 NOTE — Progress Notes (Signed)
D10 IV ordered continuous. None available on unit. Materials notified of need. Will continue to monitor.

## 2019-05-07 NOTE — Progress Notes (Signed)
Progress Note  Patient Name: [name] Date of Encounter: 05/07/2019  Primary Cardiologist: Evalina Field, MD   Subjective   No acute overnight events. Patient has been intermittently anxious since yesterday but physically it sounds like he is doing better. Has had a couple of bowel movements and General Surgery is planning on advancing patient to liquid diet today. He has had some lightheadedness with standing and states he felt like he was going to pass out but didn't. He did have one hypoglycemic event around 1:00am. No evidence of bradycardia on telemetry. Patient notes some feeling of heart racing when he is feeling anxious. No chest pain. He feels congested this morning but breathing okay.   Inpatient Medications    Scheduled Meds:  aspirin  81 mg Oral q morning - 10a   atorvastatin  20 mg Oral q morning - 10a   gabapentin  300 mg Oral TID   hydrocortisone sod succinate (SOLU-CORTEF) inj  50 mg Intravenous Daily   insulin aspart  0-9 Units Subcutaneous TID WC   insulin aspart protamine- aspart  8 Units Subcutaneous BID WC   Continuous Infusions:  sodium chloride 75 mL/hr at 05/06/19 1900   dextrose 25 mL/hr at 05/07/19 0515   doxycycline (VIBRAMYCIN) IV 100 mg (05/07/19 0527)   PRN Meds: acetaminophen **OR** acetaminophen, atropine, fluticasone, hydrALAZINE, HYDROcodone-acetaminophen, menthol-cetylpyridinium, morphine injection, ondansetron (ZOFRAN) IV, phenol, polyvinyl alcohol, traMADol   Vital Signs    Vitals:   05/06/19 2111 05/07/19 0134 05/07/19 0417 05/07/19 0832  BP: (!) 166/58 (!) 147/58 (!) 172/67 (!) 171/73  Pulse: 75 66 76 73  Resp: 18 14 14 15   Temp: 97.6 F (36.4 C) 98.3 F (36.8 C) 98.7 F (37.1 C) 98.2 F (36.8 C)  TempSrc: Oral Oral Oral   SpO2: 99% 95% 100% 99%  Weight:      Height:        Intake/Output Summary (Last 24 hours) at 05/07/2019 1045 Last data filed at 05/07/2019 0700 Gross per 24 hour  Intake 1638.39 ml  Output  200 ml  Net 1438.39 ml   Last 3 Weights 05/05/2019 04/26/2019 04/19/2019  Weight (lbs) 176 lb 5.9 oz 182 lb 12.8 oz 181 lb  Weight (kg) 80 kg 82.918 kg 82.101 kg      Telemetry    Normal sinus rhythm with rates in the 60's to 90's and 1st degree AV block. One missed beat noted with 1.68 second pause. Occasional PVC. NO additional episodes of high grade AV block. - Personally Reviewed  ECG    No new ECG tracing today. - Personally Reviewed  Physical Exam   GEN: 68 year old African-American male in no acute distress.   Neck: Supple. Cardiac: RRR. No murmurs, rubs, or gallops.  Respiratory: Clear to auscultation bilaterally. No wheezes, rhonchi, or rales. GI: Abdomen mildly distended and firm to the touch. Tenderness with very light palpation. NG tube no longer in place. MS: No lower extremity edema. Left foot ulcer wrapped. Skin: Warm and dry. Neuro:  No focal deficits. Psych: Normal affect. Responds appropriately.  Labs    High Sensitivity Troponin:   Recent Labs  Lab 05/06/19 0220 05/06/19 0520  TROPONINIHS 9 12      Chemistry Recent Labs  Lab 05/05/19 2130 05/06/19 0520 05/07/19 0316  NA 133* 136 141  K 4.2 4.8 4.1  CL 102 103 110  CO2 23 24 21*  GLUCOSE 127* 143* 86  BUN 23 20 21   CREATININE 1.69* 1.63* 1.82*  CALCIUM 8.8* 8.8* 8.9  PROT 6.9  --  6.3*  ALBUMIN 3.4*  --  3.2*  AST 33  --  30  ALT 28  --  22  ALKPHOS 77  --  59  BILITOT 1.1  --  1.2  GFRNONAA 41* 42* 37*  GFRAA 47* 49* 43*  ANIONGAP 8 9 10      Hematology Recent Labs  Lab 05/05/19 2130 05/06/19 0520 05/07/19 0316  WBC 3.6* 4.7 4.7  RBC 5.14 5.20 5.12  HGB 10.9* 10.5* 10.5*  HCT 34.8* 35.2* 34.8*  MCV 67.7* 67.7* 68.0*  MCH 21.2* 20.2* 20.5*  MCHC 31.3 29.8* 30.2  RDW 17.7* 17.3* 17.4*  PLT 202 210 184    BNP Recent Labs  Lab 05/06/19 0520  BNP 133.6*     DDimer No results for input(s): DDIMER in the last 168 hours.   Radiology    Dg Abd 1 View  Result Date:  05/06/2019 CLINICAL DATA:  NG tube placement. EXAM: ABDOMEN - 1 VIEW COMPARISON:  CT yesterday. FINDINGS: Tip of the enteric tube below the diaphragm in the stomach, side-port in the region of the gastroesophageal junction. Dilated small bowel on prior CT not as well visualized radiographically. Stool within the colon. Excreted IV contrast in the urinary bladder from prior CT. Multiple surgical clips in the pelvis. Unchanged osseous structures. IMPRESSION: 1. Tip of the enteric tube below the diaphragm in the stomach, side-port in the region of the gastroesophageal junction. Advancement of 3 cm would lead to optimal positioning. 2. Dilated small bowel on prior CT not as well visualized radiographically. Electronically Signed   By: Keith Rake M.D.   On: 05/06/2019 03:58   Ct Abdomen Pelvis W Contrast  Result Date: 05/05/2019 CLINICAL DATA:  Initial evaluation for acute nausea, vomiting, abdominal pain. History of previous SBO. EXAM: CT ABDOMEN AND PELVIS WITH CONTRAST TECHNIQUE: Multidetector CT imaging of the abdomen and pelvis was performed using the standard protocol following bolus administration of intravenous contrast. CONTRAST:  144mL OMNIPAQUE IOHEXOL 300 MG/ML  SOLN COMPARISON:  Prior CT from 12/29/2016. FINDINGS: Lower chest: Mild subsegmental atelectatic changes noted within the visualized lung bases. 9 mm nodular density along the left hemidiaphragm is little interval change as compared to previous study from 2018, most likely a benign finding. Visualized lungs are otherwise clear. Hepatobiliary: Liver demonstrates a normal contrast enhanced appearance. Gallbladder within normal limits. No biliary dilatation. Pancreas: Subcentimeter cystic lesion within the mid pancreatic body noted, grossly stable from previous, likely benign. Pancreas otherwise unremarkable. No pancreatic ductal dilatation or acute peripancreatic inflammation. Spleen: Spleen within normal limits. Adrenals/Urinary Tract:  Adrenal glands are normal. Kidneys equal in size with symmetric enhancement. 18 mm cyst noted at the upper pole of the right kidney, similar to previous. No nephrolithiasis, hydronephrosis, or focal enhancing renal mass. No visible hydroureter. Partially distended bladder within normal limits. Stomach/Bowel: Stomach within normal limits. Multiple mildly dilated fluid-filled loops of bowel seen clustered within the upper left and mid abdomen. Focal transition point within the mid abdomen present (series 3, image 28). Small bowel is decompressed distally. Finding consistent with acute mechanical small bowel obstruction. Underlying adhesive disease is suspected. No associated pneumatosis or portal venous gas. No other acute inflammatory changes about the small bowel. Moderate retained stool seen diffusely throughout the colon, suggesting constipation. Appendix within normal limits. Vascular/Lymphatic: Normal intravascular enhancement seen throughout the intra-abdominal aorta. Mesenteric vessels patent proximally. Mild aorto bi-iliac atherosclerotic disease. No pathologically enlarged intra-abdominopelvic lymph nodes. Mildly prominent  bilateral inguinal lymph nodes measure up to 13 mm in short axis, within normal limits. Reproductive: Prior prostatectomy. Other: No free air or fluid. Musculoskeletal: No acute osseous abnormality. No discrete lytic or blastic osseous lesions. Moderate degenerative spondylolysis present at L3-4 through L5-S1. Osteoarthritic changes noted about the hips bilaterally. IMPRESSION: 1. Findings consistent with acute mechanical small bowel obstruction with transition point within the mid abdomen as above. 2. No other acute intra-abdominal or pelvic process. 3. Moderate retained stool diffusely throughout the colon, suggesting constipation. 4. Prior prostatectomy. Electronically Signed   By: Jeannine Boga M.D.   On: 05/05/2019 23:33   Dg Chest Portable 1 View  Result Date:  05/05/2019 CLINICAL DATA:  Abdominal pain. Vomiting. EXAM: PORTABLE CHEST 1 VIEW COMPARISON:  10/19/2018 FINDINGS: Borderline cardiomegaly with unchanged mediastinal contours. Pulmonary vasculature is normal. No consolidation, pleural effusion, or pneumothorax. No acute osseous abnormalities are seen. IMPRESSION: Borderline cardiomegaly.  No acute pulmonary process. Electronically Signed   By: Keith Rake M.D.   On: 05/05/2019 22:16   Dg Abd Portable 1v  Result Date: 05/07/2019 CLINICAL DATA:  NG tube placement. EXAM: PORTABLE ABDOMEN - 1 VIEW COMPARISON:  05/06/2019.  CT 05/05/2019. FINDINGS: No NG tube noted. Oral contrast noted throughout the colon. No bowel distention. No free air. Pelvic clips are noted. IMPRESSION: No NG tube noted. Oral contrast noted throughout the colon. No bowel distention. No free air. Electronically Signed   By: Marcello Moores  Register   On: 05/07/2019 06:57   Dg Abd Portable 1v-small Bowel Obstruction Protocol-initial, 8 Hr Delay  Result Date: 05/06/2019 CLINICAL DATA:  Small-bowel obstruction. 8 hour delayed examination. EXAM: PORTABLE ABDOMEN - 1 VIEW COMPARISON:  Earlier same day; CT abdomen pelvis-05/05/2019 FINDINGS: Interval transit of ingested contrast, now seen throughout the colon to the level of the rectum. A small amount of contrast remains within the terminal ileum. Enteric tube tip and side port projected the expected location of the gastric fundus. There is no significant gaseous distension of the upstream small bowel. No supine evidence of pneumoperitoneum. No pneumatosis or portal venous gas Limited visualization of lower thorax demonstrates an enlarged cardiac silhouette. No acute or aggressive osseous abnormalities. Severe degenerative change the right hip is suspected though incompletely evaluated. Post prostatectomy. IMPRESSION: 1. Enteric contrast now seen throughout the colon to the level the rectum. No evidence of enteric obstruction. 2. Enteric tube tip and  side port project over the expected location of the gastric fundus. Electronically Signed   By: Sandi Mariscal M.D.   On: 05/06/2019 19:37   Dg Abd Portable 1v-small Bowel Protocol-position Verification  Result Date: 05/06/2019 CLINICAL DATA:  NG tube placement. Small bowel obstruction. EXAM: PORTABLE ABDOMEN - 1 VIEW 10:45 a.m. COMPARISON:  05/06/2019 at 3:40 a.m. FINDINGS: NG tube tip is in the body of the stomach. Side hole is near the gastroesophageal junction as previously described. The visualized bowel gas pattern is normal. IMPRESSION: No significant change in the position of the nasogastric tube. The side hole is near the gastroesophageal junction. Electronically Signed   By: Lorriane Shire M.D.   On: 05/06/2019 10:59   Vas [place] Lower Extremity Venous (dvt)  Result Date: 05/06/2019  Lower Venous Study Indications: Swelling, and Pain.  Limitations: Body habitus, poor ultrasound/tissue interface and bandages. Comparison Study: 10/19/2018- negative left lower extremity venous duplex. Performing Technologist: Maudry Mayhew MHA, RDMS, RVT, RDCS  Examination Guidelines: A complete evaluation includes B-mode imaging, spectral Doppler, color Doppler, and power Doppler as needed of all accessible  portions of each vessel. Bilateral testing is considered an integral part of a complete examination. Limited examinations for reoccurring indications may be performed as noted.  Right CFV not visualized.  +---------+---------------+---------+-----------+----------+--------------+  LEFT      Compressibility Phasicity Spontaneity Properties Thrombus Aging  +---------+---------------+---------+-----------+----------+--------------+  CFV       Full            No        Yes                                    +---------+---------------+---------+-----------+----------+--------------+  SFJ       Full                                                              +---------+---------------+---------+-----------+----------+--------------+  FV Prox   Full                                                             +---------+---------------+---------+-----------+----------+--------------+  FV Mid    Full                                                             +---------+---------------+---------+-----------+----------+--------------+  FV Distal Full                                                             +---------+---------------+---------+-----------+----------+--------------+  PFV       Full                                                             +---------+---------------+---------+-----------+----------+--------------+  POP       Full            Yes       Yes                                    +---------+---------------+---------+-----------+----------+--------------+  PTV       Full                      Yes                                    +---------+---------------+---------+-----------+----------+--------------+ Unable to adequately visualize the left peroneal veins. Limited visualization of the left posterior tibial veins.  Summary: Left: There is no evidence of deep vein thrombosis in the lower extremity. However, portions of this examination were limited- see technologist comments above. No cystic structure found in the popliteal fossa. Ultrasound characteristics of enlarged lymph  nodes noted in the groin.  *See table(s) above for measurements and observations. Electronically signed by Monica Martinez MD on 05/06/2019 at 4:29:48 PM.    Final     Cardiac Studies   Echocardiogram 05/06/2019: Impressions:  1. Left ventricular ejection fraction, by visual estimation, is 60 to 65%. The left ventricle has normal function. Normal left ventricular size. Mildly increased left ventricular posterior wall thickness. There is no left ventricular hypertrophy.  2. Left ventricular diastolic Doppler parameters are consistent with impaired relaxation pattern  of LV diastolic filling.  3. Global right ventricle has normal systolic function.The right ventricular size is normal. No increase in right ventricular wall thickness.  4. Left atrial size was normal.  5. Right atrial size was normal.  6. The mitral valve is normal in structure. Mild mitral valve regurgitation. No evidence of mitral stenosis.  7. The tricuspid valve is normal in structure. Tricuspid valve regurgitation was not visualized by color flow Doppler.  8. The aortic valve is normal in structure. Aortic valve regurgitation is mild by color flow Doppler. Structurally normal aortic valve, with no evidence of sclerosis or stenosis.  9. The pulmonic valve was normal in structure. Pulmonic valve regurgitation is not visualized by color flow Doppler. 10. The inferior vena cava is normal in size with greater than 50% respiratory variability, suggesting right atrial pressure of 3 mmHg. _______________  Lower Extremity Ultrasounds 05/06/2019: Summary: Left: There is no evidence of deep vein thrombosis in the lower extremity. However, portions of this examination were limited- see technologist comments above. No cystic structure found in the popliteal fossa. Ultrasound characteristics of enlarged lymph nodes noted in the groin. _______________  Lexiscan SPECT MPI 10/09/2018 [facility]): There is a small sized mild intensity reversible defect of the lateral wall suggestive of ischemia.Fixed inferior defect with no associated wall motion abnormality consistent with diaphragmatic attenuation artifact. Normal LV function with a calculated EF of 63%.  Patient Profile   Mr. Battie is a 68 y.o. male with a history of pulmonary sarcoidosis initially diagnosed in 2007, hypertension, diabetes, CKD stage III, and prostate cancer who presented to the ED on 05/05/2019 for abdominal pain and was found to have small bowel obstruction. On arrival to the ED, he was found to be bradycardic with evidence of complete  heart block. Cardiology consulted for further evaluation.  Assessment & Plan    Bradycardia with Episode of Complete Heart Block - Patient had a short episode of complete heart blocker with ventricular escape in the 30's on arrival to the ED. However, since then he has been maintaining normal sinus rhythm.  - Telemetry shows normal sinus rhythm with rates in the 60's to 90's and 1st degree AV block. One missed beat noted with 1.68 second pause. Occasional PVC. No additional episodes of high grade AV block. - Felt to be due to increased vagal tone in the setting of abdominal pain and recurrent emesis. - Planning for 30-day event monitor at discharge if he remains stable during this hospitalization.   Dyspnea on Exertion - Patient reports dyspnea when carrying heavy water container up several flights of steps. No other dyspnea on exertion. No chest pain.  - Patient had Myoview stress test in 09/2018 at [facility] that showed small mild intensity reversible  defect of the lateral wall suggestive of ischemia. However, looks like there was no plans for any additional work-up. - Echo this admission showed 60-65% with grade 1 diastolic dysfunction. - Patient appears euvolemic on exam.  - No additional work-up at this time.   Hypertension - BP elevated at 171/73. - Currently on PRN Hydralazine has patient is not able to tolerate PO. - Management per primary team.   Uncontrolled Diabetes Mellitus - Hemoglobin A1c 10.3 this admission. - Management per primary team.  Pulmonary Sarcoidosis - Patient reportedly diagnosed with pulmonary sarcoidosis in 2007 but he does not remember this.   CKD Stage III - Serum creatinine 1.82 today. Baseline looks to be around 1.6. - Continue to monitor.  Small Bowel Obstruction - Patient had a few bowel movements overnight. Now on liquid diet. - Management per General Surgery.  For questions or updates, please contact [place] Please consult  [email or web address] for contact info under        Signed, Darreld Mclean, PA-C  05/07/2019, 10:45 AM

## 2019-05-07 NOTE — Progress Notes (Addendum)
Abd x-ray resulted with no NG tube noted. This RN explained advancing the NG tube further down and attempted to do this. Patient did not tolerate, c/o SOB, and asked for NG tube to be removed. NG tube removed. Jose, RN aware. Lupita Leash, MD notified via text page.  Kc called this RN and states to notify surgery regarding NG tube. [place] notified of NG tube being removed via text page.

## 2019-05-07 NOTE — Progress Notes (Signed)
Subjective/Chief Complaint: Pt states that this morning his NG tube came up a little, and a nurse had some difficulty trying to get it back in. Pt states he was having trouble breathing during this. The NG tube ended up being removed this morning. Pt states he feels congested. Pt states he had nausea and vomiting last night. He also states he had 2 MB today, but has not passed flatus. He is also voiding without complications. He states when he ambulates or strains he has abdominal pain. Pt is NPO and is not hungry.    Objective: Vital signs in last 24 hours: Temp:  [97.6 F (36.4 C)-99 F (37.2 C)] 98.2 F (36.8 C) (09/22 0832) Pulse Rate:  [61-86] 73 (09/22 0832) Resp:  [0-23] 15 (09/22 0832) BP: (135-188)/(58-100) 171/73 (09/22 0832) SpO2:  [95 %-100 %] 99 % (09/22 0832) Last BM Date: 05/07/19  Intake/Output from previous day: 09/21 0701 - 09/22 0700 In: 2368.4 [I.V.:1195.7; IV Piggyback:1172.7] Out: 200 [Emesis/NG output:200] Intake/Output this shift: No intake/output data recorded.  General: No acute distress.  CV: Regular rate and rhythm.  Pulm: clear to auscultation in all lung fields.  Abdomen: distended, normoactive BS, tender to palpation in all quadrants, especially the LLQ.   Lab Results:  Recent Labs    05/06/19 0520 05/07/19 0316  WBC 4.7 4.7  HGB 10.5* 10.5*  HCT 35.2* 34.8*  PLT 210 184   BMET Recent Labs    05/06/19 0520 05/07/19 0316  NA 136 141  K 4.8 4.1  CL 103 110  CO2 24 21*  GLUCOSE 143* 86  BUN 20 21  CREATININE 1.63* 1.82*  CALCIUM 8.8* 8.9   PT/INR Recent Labs    05/06/19 0748  LABPROT 14.7  INR 1.2   ABG No results for input(s): PHART, HCO3 in the last 72 hours.  Invalid input(s): PCO2, PO2  Studies/Results: Dg Abd 1 View  Result Date: 05/06/2019 CLINICAL DATA:  NG tube placement. EXAM: ABDOMEN - 1 VIEW COMPARISON:  CT yesterday. FINDINGS: Tip of the enteric tube below the diaphragm in the stomach, side-port in the  region of the gastroesophageal junction. Dilated small bowel on prior CT not as well visualized radiographically. Stool within the colon. Excreted IV contrast in the urinary bladder from prior CT. Multiple surgical clips in the pelvis. Unchanged osseous structures. IMPRESSION: 1. Tip of the enteric tube below the diaphragm in the stomach, side-port in the region of the gastroesophageal junction. Advancement of 3 cm would lead to optimal positioning. 2. Dilated small bowel on prior CT not as well visualized radiographically. Electronically Signed   By: Keith Rake M.D.   On: 05/06/2019 03:58   Ct Abdomen Pelvis W Contrast  Result Date: 05/05/2019 CLINICAL DATA:  Initial evaluation for acute nausea, vomiting, abdominal pain. History of previous SBO. EXAM: CT ABDOMEN AND PELVIS WITH CONTRAST TECHNIQUE: Multidetector CT imaging of the abdomen and pelvis was performed using the standard protocol following bolus administration of intravenous contrast. CONTRAST:  164mL OMNIPAQUE IOHEXOL 300 MG/ML  SOLN COMPARISON:  Prior CT from 12/29/2016. FINDINGS: Lower chest: Mild subsegmental atelectatic changes noted within the visualized lung bases. 9 mm nodular density along the left hemidiaphragm is little interval change as compared to previous study from 2018, most likely a benign finding. Visualized lungs are otherwise clear. Hepatobiliary: Liver demonstrates a normal contrast enhanced appearance. Gallbladder within normal limits. No biliary dilatation. Pancreas: Subcentimeter cystic lesion within the mid pancreatic body noted, grossly stable from previous, likely benign. Pancreas  otherwise unremarkable. No pancreatic ductal dilatation or acute peripancreatic inflammation. Spleen: Spleen within normal limits. Adrenals/Urinary Tract: Adrenal glands are normal. Kidneys equal in size with symmetric enhancement. 18 mm cyst noted at the upper pole of the right kidney, similar to previous. No nephrolithiasis, hydronephrosis,  or focal enhancing renal mass. No visible hydroureter. Partially distended bladder within normal limits. Stomach/Bowel: Stomach within normal limits. Multiple mildly dilated fluid-filled loops of bowel seen clustered within the upper left and mid abdomen. Focal transition point within the mid abdomen present (series 3, image 28). Small bowel is decompressed distally. Finding consistent with acute mechanical small bowel obstruction. Underlying adhesive disease is suspected. No associated pneumatosis or portal venous gas. No other acute inflammatory changes about the small bowel. Moderate retained stool seen diffusely throughout the colon, suggesting constipation. Appendix within normal limits. Vascular/Lymphatic: Normal intravascular enhancement seen throughout the intra-abdominal aorta. Mesenteric vessels patent proximally. Mild aorto bi-iliac atherosclerotic disease. No pathologically enlarged intra-abdominopelvic lymph nodes. Mildly prominent bilateral inguinal lymph nodes measure up to 13 mm in short axis, within normal limits. Reproductive: Prior prostatectomy. Other: No free air or fluid. Musculoskeletal: No acute osseous abnormality. No discrete lytic or blastic osseous lesions. Moderate degenerative spondylolysis present at L3-4 through L5-S1. Osteoarthritic changes noted about the hips bilaterally. IMPRESSION: 1. Findings consistent with acute mechanical small bowel obstruction with transition point within the mid abdomen as above. 2. No other acute intra-abdominal or pelvic process. 3. Moderate retained stool diffusely throughout the colon, suggesting constipation. 4. Prior prostatectomy. Electronically Signed   By: Jeannine Boga M.D.   On: 05/05/2019 23:33   Dg Chest Portable 1 View  Result Date: 05/05/2019 CLINICAL DATA:  Abdominal pain. Vomiting. EXAM: PORTABLE CHEST 1 VIEW COMPARISON:  10/19/2018 FINDINGS: Borderline cardiomegaly with unchanged mediastinal contours. Pulmonary vasculature is  normal. No consolidation, pleural effusion, or pneumothorax. No acute osseous abnormalities are seen. IMPRESSION: Borderline cardiomegaly.  No acute pulmonary process. Electronically Signed   By: Keith Rake M.D.   On: 05/05/2019 22:16   Dg Abd Portable 1v  Result Date: 05/07/2019 CLINICAL DATA:  NG tube placement. EXAM: PORTABLE ABDOMEN - 1 VIEW COMPARISON:  05/06/2019.  CT 05/05/2019. FINDINGS: No NG tube noted. Oral contrast noted throughout the colon. No bowel distention. No free air. Pelvic clips are noted. IMPRESSION: No NG tube noted. Oral contrast noted throughout the colon. No bowel distention. No free air. Electronically Signed   By: Marcello Moores  Register   On: 05/07/2019 06:57   Dg Abd Portable 1v-small Bowel Obstruction Protocol-initial, 8 Hr Delay  Result Date: 05/06/2019 CLINICAL DATA:  Small-bowel obstruction. 8 hour delayed examination. EXAM: PORTABLE ABDOMEN - 1 VIEW COMPARISON:  Earlier same day; CT abdomen pelvis-05/05/2019 FINDINGS: Interval transit of ingested contrast, now seen throughout the colon to the level of the rectum. A small amount of contrast remains within the terminal ileum. Enteric tube tip and side port projected the expected location of the gastric fundus. There is no significant gaseous distension of the upstream small bowel. No supine evidence of pneumoperitoneum. No pneumatosis or portal venous gas Limited visualization of lower thorax demonstrates an enlarged cardiac silhouette. No acute or aggressive osseous abnormalities. Severe degenerative change the right hip is suspected though incompletely evaluated. Post prostatectomy. IMPRESSION: 1. Enteric contrast now seen throughout the colon to the level the rectum. No evidence of enteric obstruction. 2. Enteric tube tip and side port project over the expected location of the gastric fundus. Electronically Signed   By: [name].D.  On: 05/06/2019 19:37   Dg Abd Portable 1v-small Bowel Protocol-position  Verification  Result Date: 05/06/2019 CLINICAL DATA:  NG tube placement. Small bowel obstruction. EXAM: PORTABLE ABDOMEN - 1 VIEW 10:45 a.m. COMPARISON:  05/06/2019 at 3:40 a.m. FINDINGS: NG tube tip is in the body of the stomach. Side hole is near the gastroesophageal junction as previously described. The visualized bowel gas pattern is normal. IMPRESSION: No significant change in the position of the nasogastric tube. The side hole is near the gastroesophageal junction. Electronically Signed   By: Lorriane Shire M.D.   On: 05/06/2019 10:59   Vas [place] Lower Extremity Venous (dvt)  Result Date: 05/06/2019  Lower Venous Study Indications: Swelling, and Pain.  Limitations: Body habitus, poor ultrasound/tissue interface and bandages. Comparison Study: 10/19/2018- negative left lower extremity venous duplex. Performing Technologist: Maudry Mayhew MHA, RDMS, RVT, RDCS  Examination Guidelines: A complete evaluation includes B-mode imaging, spectral Doppler, color Doppler, and power Doppler as needed of all accessible portions of each vessel. Bilateral testing is considered an integral part of a complete examination. Limited examinations for reoccurring indications may be performed as noted.  Right CFV not visualized.  +---------+---------------+---------+-----------+----------+--------------+ LEFT     CompressibilityPhasicitySpontaneityPropertiesThrombus Aging +---------+---------------+---------+-----------+----------+--------------+ CFV      Full           No       Yes                                 +---------+---------------+---------+-----------+----------+--------------+ SFJ      Full                                                        +---------+---------------+---------+-----------+----------+--------------+ FV Prox  Full                                                        +---------+---------------+---------+-----------+----------+--------------+ FV Mid   Full                                                         +---------+---------------+---------+-----------+----------+--------------+ FV DistalFull                                                        +---------+---------------+---------+-----------+----------+--------------+ PFV      Full                                                        +---------+---------------+---------+-----------+----------+--------------+ POP      Full           Yes      Yes                                 +---------+---------------+---------+-----------+----------+--------------+  PTV      Full                    Yes                                 +---------+---------------+---------+-----------+----------+--------------+ Unable to adequately visualize the left peroneal veins. Limited visualization of the left posterior tibial veins.  Summary: Left: There is no evidence of deep vein thrombosis in the lower extremity. However, portions of this examination were limited- see technologist comments above. No cystic structure found in the popliteal fossa. Ultrasound characteristics of enlarged lymph  nodes noted in the groin.  *See table(s) above for measurements and observations. Electronically signed by Monica Martinez MD on 05/06/2019 at 4:29:48 PM.    Final     Anti-infectives: Anti-infectives (From admission, onward)   Start     Dose/Rate Route Frequency Ordered Stop   05/06/19 0230  doxycycline (VIBRAMYCIN) 100 mg in sodium chloride 0.9 % 250 mL IVPB     100 mg 125 mL/hr over 120 Minutes Intravenous Every 12 hours 05/06/19 0212     05/06/19 0215  doxycycline (VIBRAMYCIN) 200 mg in dextrose 5 % 250 mL IVPB  Status:  Discontinued     200 mg 125 mL/hr over 120 Minutes Intravenous Every 12 hours 05/06/19 0212 05/06/19 0212      Assessment/Plan: SBO ID: none FEV: NPO, IVF DVT: none  Follow up: TBD Replace NG tube and continue SBO protocol. Continue to monitor.    LOS: 1 day    Renato Gails  Jacoya Bauman 05/07/2019

## 2019-05-08 ENCOUNTER — Other Ambulatory Visit: Payer: Self-pay | Admitting: Medical

## 2019-05-08 ENCOUNTER — Inpatient Hospital Stay (HOSPITAL_COMMUNITY): Payer: Medicare Other

## 2019-05-08 DIAGNOSIS — M14672 Charcot's joint, left ankle and foot: Secondary | ICD-10-CM

## 2019-05-08 DIAGNOSIS — I442 Atrioventricular block, complete: Secondary | ICD-10-CM

## 2019-05-08 DIAGNOSIS — E11621 Type 2 diabetes mellitus with foot ulcer: Secondary | ICD-10-CM

## 2019-05-08 DIAGNOSIS — L97421 Non-pressure chronic ulcer of left heel and midfoot limited to breakdown of skin: Secondary | ICD-10-CM

## 2019-05-08 DIAGNOSIS — E08621 Diabetes mellitus due to underlying condition with foot ulcer: Secondary | ICD-10-CM

## 2019-05-08 DIAGNOSIS — E11610 Type 2 diabetes mellitus with diabetic neuropathic arthropathy: Secondary | ICD-10-CM

## 2019-05-08 DIAGNOSIS — L97521 Non-pressure chronic ulcer of other part of left foot limited to breakdown of skin: Secondary | ICD-10-CM

## 2019-05-08 LAB — CBC
HCT: 28.5 % — ABNORMAL LOW (ref 39.0–52.0)
Hemoglobin: 8.8 g/dL — ABNORMAL LOW (ref 13.0–17.0)
MCH: 20.6 pg — ABNORMAL LOW (ref 26.0–34.0)
MCHC: 30.9 g/dL (ref 30.0–36.0)
MCV: 66.7 fL — ABNORMAL LOW (ref 80.0–100.0)
Platelets: 166 10*3/uL (ref 150–400)
RBC: 4.27 MIL/uL (ref 4.22–5.81)
RDW: 17.4 % — ABNORMAL HIGH (ref 11.5–15.5)
WBC: 5.2 10*3/uL (ref 4.0–10.5)
nRBC: 0 % (ref 0.0–0.2)

## 2019-05-08 LAB — GLUCOSE, CAPILLARY
Glucose-Capillary: 108 mg/dL — ABNORMAL HIGH (ref 70–99)
Glucose-Capillary: 111 mg/dL — ABNORMAL HIGH (ref 70–99)
Glucose-Capillary: 116 mg/dL — ABNORMAL HIGH (ref 70–99)
Glucose-Capillary: 119 mg/dL — ABNORMAL HIGH (ref 70–99)
Glucose-Capillary: 130 mg/dL — ABNORMAL HIGH (ref 70–99)
Glucose-Capillary: 211 mg/dL — ABNORMAL HIGH (ref 70–99)

## 2019-05-08 LAB — COMPREHENSIVE METABOLIC PANEL
ALT: 20 U/L (ref 0–44)
AST: 22 U/L (ref 15–41)
Albumin: 2.7 g/dL — ABNORMAL LOW (ref 3.5–5.0)
Alkaline Phosphatase: 49 U/L (ref 38–126)
Anion gap: 4 — ABNORMAL LOW (ref 5–15)
BUN: 26 mg/dL — ABNORMAL HIGH (ref 8–23)
CO2: 25 mmol/L (ref 22–32)
Calcium: 8.5 mg/dL — ABNORMAL LOW (ref 8.9–10.3)
Chloride: 109 mmol/L (ref 98–111)
Creatinine, Ser: 2.01 mg/dL — ABNORMAL HIGH (ref 0.61–1.24)
GFR calc Af Amer: 38 mL/min — ABNORMAL LOW (ref >60)
GFR calc non Af Amer: 33 mL/min — ABNORMAL LOW (ref >60)
Glucose, Bld: 119 mg/dL — ABNORMAL HIGH (ref 70–99)
Potassium: 3.7 mmol/L (ref 3.5–5.1)
Sodium: 138 mmol/L (ref 135–145)
Total Bilirubin: 1.1 mg/dL (ref 0.3–1.2)
Total Protein: 5.4 g/dL — ABNORMAL LOW (ref 6.5–8.1)

## 2019-05-08 MED ORDER — HEPARIN SODIUM (PORCINE) 5000 UNIT/ML IJ SOLN
5000.0000 [IU] | Freq: Three times a day (TID) | INTRAMUSCULAR | Status: DC
  Administered 2019-05-08 – 2019-05-09 (×3): 5000 [IU] via SUBCUTANEOUS
  Filled 2019-05-08 (×3): qty 1

## 2019-05-08 MED ORDER — SODIUM CHLORIDE 0.9 % IV SOLN
INTRAVENOUS | Status: DC | PRN
  Administered 2019-05-08: 250 mL via INTRAVENOUS

## 2019-05-08 NOTE — Progress Notes (Signed)
   Telemetry reviewed.  No recurrent CHB and BP is stable.  Please let us know when he is ready for discharge and we will arrange for 30 day event monitor and follow up.   Skeet Latch, MD  05/08/2019, 11:42 AM

## 2019-05-08 NOTE — Progress Notes (Signed)
Patient has no IV site. Starla Link, MD verified that it was OK for patient to not have IV site. Doxycycline IV still ordered. Baltazar Najjar, NP notified via text page. Will continue to monitor.

## 2019-05-08 NOTE — Progress Notes (Signed)
Patient's IV leaking. MD Starla Link OK with removing and not placing new IV. IV doxycycline not given this evening. Awaiting for possible new orders for PO.

## 2019-05-08 NOTE — Progress Notes (Signed)
[facility] Progress Note     Subjective: CC: sbo Patient tolerating CLD and having bowel function. Does complain of some low abdominal pain that is sharp and intermittent, some occasional nausea. Reports pain improves with BM.   Objective: Vital signs in last 24 hours: Temp:  [98.1 F (36.7 C)-98.3 F (36.8 C)] 98.3 F (36.8 C) (09/23 0822) Pulse Rate:  [59-80] 60 (09/23 0822) Resp:  [14-18] 17 (09/23 0822) BP: (133-190)/(52-85) 135/63 (09/23 0822) SpO2:  [98 %-100 %] 99 % (09/23 0822) Last BM Date: 05/08/19  Intake/Output from previous day: 09/22 0701 - 09/23 0700 In: 1883.6 [P.O.:460; I.V.:1106.5; IV Piggyback:317.2] Out: -  Intake/Output this shift: No intake/output data recorded.  PE: Gen:  Alert, NAD, pleasant Card:  Regular rate and rhythm Pulm:  Normal effort, clear to auscultation bilaterally Abd: Soft, non-distended, +BS, some voluntary guarding, ttp in lower abdomen, no peritonitis Skin: warm and dry, no rashes  Psych: A&Ox3   Lab Results:  Recent Labs    05/07/19 0316 05/08/19 0344  WBC 4.7 5.2  HGB 10.5* 8.8*  HCT 34.8* 28.5*  PLT 184 166   BMET Recent Labs    05/07/19 0316 05/08/19 0344  NA 141 138  K 4.1 3.7  CL 110 109  CO2 21* 25  GLUCOSE 86 119*  BUN 21 26*  CREATININE 1.82* 2.01*  CALCIUM 8.9 8.5*   PT/INR Recent Labs    05/06/19 0748  LABPROT 14.7  INR 1.2   CMP     Component Value Date/Time   NA 138 05/08/2019 0344   NA 141 04/19/2019 1013   K 3.7 05/08/2019 0344   CL 109 05/08/2019 0344   CO2 25 05/08/2019 0344   GLUCOSE 119 (H) 05/08/2019 0344   BUN 26 (H) 05/08/2019 0344   BUN 31 (H) 04/19/2019 1013   CREATININE 2.01 (H) 05/08/2019 0344   CALCIUM 8.5 (L) 05/08/2019 0344   PROT 5.4 (L) 05/08/2019 0344   PROT 6.5 04/19/2019 1013   ALBUMIN 2.7 (L) 05/08/2019 0344   ALBUMIN 3.6 (L) 04/19/2019 1013   AST 22 05/08/2019 0344   ALT 20 05/08/2019 0344   ALKPHOS 49 05/08/2019 0344   BILITOT 1.1 05/08/2019  0344   BILITOT 0.5 04/19/2019 1013   GFRNONAA 33 (L) 05/08/2019 0344   GFRAA 38 (L) 05/08/2019 0344   Lipase     Component Value Date/Time   LIPASE 19 05/05/2019 2130       Studies/Results: Dg Abd Portable 1v  Result Date: 05/07/2019 CLINICAL DATA:  NG tube placement. EXAM: PORTABLE ABDOMEN - 1 VIEW COMPARISON:  05/06/2019.  CT 05/05/2019. FINDINGS: No NG tube noted. Oral contrast noted throughout the colon. No bowel distention. No free air. Pelvic clips are noted. IMPRESSION: No NG tube noted. Oral contrast noted throughout the colon. No bowel distention. No free air. Electronically Signed   By: Marcello Moores  Register   On: 05/07/2019 06:57   Dg Abd Portable 1v-small Bowel Obstruction Protocol-initial, 8 Hr Delay  Result Date: 05/06/2019 CLINICAL DATA:  Small-bowel obstruction. 8 hour delayed examination. EXAM: PORTABLE ABDOMEN - 1 VIEW COMPARISON:  Earlier same day; CT abdomen pelvis-05/05/2019 FINDINGS: Interval transit of ingested contrast, now seen throughout the colon to the level of the rectum. A small amount of contrast remains within the terminal ileum. Enteric tube tip and side port projected the expected location of the gastric fundus. There is no significant gaseous distension of the upstream small bowel. No supine evidence of pneumoperitoneum. No pneumatosis or portal  venous gas Limited visualization of lower thorax demonstrates an enlarged cardiac silhouette. No acute or aggressive osseous abnormalities. Severe degenerative change the right hip is suspected though incompletely evaluated. Post prostatectomy. IMPRESSION: 1. Enteric contrast now seen throughout the colon to the level the rectum. No evidence of enteric obstruction. 2. Enteric tube tip and side port project over the expected location of the gastric fundus. Electronically Signed   By: Sandi Mariscal M.D.   On: 05/06/2019 19:37   Dg Abd Portable 1v-small Bowel Protocol-position Verification  Result Date: 05/06/2019 CLINICAL  DATA:  NG tube placement. Small bowel obstruction. EXAM: PORTABLE ABDOMEN - 1 VIEW 10:45 a.m. COMPARISON:  05/06/2019 at 3:40 a.m. FINDINGS: NG tube tip is in the body of the stomach. Side hole is near the gastroesophageal junction as previously described. The visualized bowel gas pattern is normal. IMPRESSION: No significant change in the position of the nasogastric tube. The side hole is near the gastroesophageal junction. Electronically Signed   By: Lorriane Shire M.D.   On: 05/06/2019 10:59    Anti-infectives: Anti-infectives (From admission, onward)   Start     Dose/Rate Route Frequency Ordered Stop   05/06/19 0230  doxycycline (VIBRAMYCIN) 100 mg in sodium chloride 0.9 % 250 mL IVPB     100 mg 125 mL/hr over 120 Minutes Intravenous Every 12 hours 05/06/19 0212     05/06/19 0215  doxycycline (VIBRAMYCIN) 200 mg in dextrose 5 % 250 mL IVPB  Status:  Discontinued     200 mg 125 mL/hr over 120 Minutes Intravenous Every 12 hours 05/06/19 0212 05/06/19 0212       Assessment/Plan HTN Complete heart block Sarcoidosis CKD stage III Diabetic foot ulcer with infection T2DM with retinopathy Hx of adrenal insufficiency  Hx of prostate cancer s/p prostatectomy   SBO - film yesterday with contrast throughout colon - patient having bowel function and clinically appears benign - complaining of more lower abdominal pain - check another film today, if film looks ok can ADAT to soft  FEN: CLD, IVF VTE: SCDs, ok to have chemical prophylaxis from surgery standpoint ID: [id number]>>  LOS: 2 days    Brigid Re , [facility] 05/08/2019, 9:27 AM Pager: [phone] Consults: [phone] 7:00 AM - 4:30 PM M, W-F 7:00 AM - 11:30 AM Tues, Sat, Sun

## 2019-05-08 NOTE — Evaluation (Signed)
Physical Therapy Evaluation Patient Details Name: [name] MRN: [id number] DOB: 04-05-1951 Today's Date: 05/08/2019   History of Present Illness  Pt is a 68 y/o male admitted secondary to increased abdominal pain. Found to have SBO. Pt also with complete heart block upon admission. Per cardiology notes, plan to monitor outpatient. Pt with chronic L foot ulceration. PMH includes DM, prostate cancer, diabetic retinopathy, CKD, and sarcoidosis.   Clinical Impression  Pt admitted secondary to problem above with deficits below. Pt very unaware of current mobility limitations and insistent on performing long distance ambulation, even when fatigued. Required min guard for safety using RW and 2 seated rests during gait training. HR in upper 60s throughout. Reports friends are available at home to assist if needed. Will continue to follow acutely to maximize functional mobility independence and safety.     Follow Up Recommendations Home health PT;Supervision for mobility/OOB    Equipment Recommendations  None recommended by PT    Recommendations for Other Services       Precautions / Restrictions Precautions Precautions: Fall Required Braces or Orthoses: Other Brace Other Brace: Darco shoe for L foot.  Restrictions Weight Bearing Restrictions: No Other Position/Activity Restrictions: ``      Mobility  Bed Mobility               General bed mobility comments: Sitting EOB upon entry.   Transfers Overall transfer level: Needs assistance Equipment used: Rolling walker (2 wheeled) Transfers: Sit to/from Stand Sit to Stand: Min guard         General transfer comment: Min guard for safety. Cues for safe hand placement.   Ambulation/Gait Ambulation/Gait assistance: Min guard;Supervision Gait Distance (Feet): 300 Feet Assistive device: Rolling walker (2 wheeled) Gait Pattern/deviations: Step-to pattern;Step-through pattern;Decreased step length - right;Decreased step  length - left;Decreased weight shift to left;Antalgic Gait velocity: Decreased   General Gait Details: Slow, antalgic gait. Overall steady with RW. Pt with noticable fatigue, however, refusing to turn when PT suggested. Required 2 seated rests during gait.   Stairs            Wheelchair Mobility    Modified Rankin (Stroke Patients Only)       Balance Overall balance assessment: Needs assistance Sitting-balance support: No upper extremity supported;Feet supported Sitting balance-Leahy Scale: Fair     Standing balance support: Bilateral upper extremity supported;During functional activity Standing balance-Leahy Scale: Poor Standing balance comment: Reliant on BUE support                              Pertinent Vitals/Pain Pain Assessment: Faces Faces Pain Scale: Hurts a little bit Pain Location: abdomen, L foot  Pain Descriptors / Indicators: Aching;Operative site guarding Pain Intervention(s): Monitored during session;Limited activity within patient's tolerance;Repositioned    Home Living Family/patient expects to be discharged to:: Private residence Living Arrangements: Non-relatives/Friends Available Help at Discharge: Friend(s);Available PRN/intermittently Type of Home: House Home Access: Stairs to enter Entrance Stairs-Rails: None Entrance Stairs-Number of Steps: 3 Home Layout: One level Home Equipment: Walker - 2 wheels      Prior Function Level of Independence: Independent with assistive device(s)         Comments: Uses RW for mobility tasks     Hand Dominance        Extremity/Trunk Assessment   Upper Extremity Assessment Upper Extremity Assessment: Overall WFL for tasks assessed    Lower Extremity Assessment Lower Extremity Assessment: LLE deficits/detail LLE Deficits /  Details: L foot wound at baseline.     Cervical / Trunk Assessment Cervical / Trunk Assessment: Normal  Communication   Communication: No difficulties   Cognition Arousal/Alertness: Awake/alert Behavior During Therapy: WFL for tasks assessed/performed Overall Cognitive Status: No family/caregiver present to determine baseline cognitive functioning                                 General Comments: Pt with slowed processing. Pt also easily distracted and with decreased safety awareness.       General Comments      Exercises     Assessment/Plan    PT Assessment Patient needs continued PT services  PT Problem List Decreased strength;Decreased mobility;Decreased safety awareness;Decreased knowledge of use of DME;Decreased knowledge of precautions;Decreased activity tolerance;Decreased balance       PT Treatment Interventions DME instruction;Gait training;Stair training;Functional mobility training;Therapeutic activities;Therapeutic exercise;Balance training;Patient/family education    PT Goals (Current goals can be found in the Care Plan section)  Acute Rehab PT Goals Patient Stated Goal: to go home PT Goal Formulation: With patient Time For Goal Achievement: 05/22/19 Potential to Achieve Goals: Good    Frequency Min 3X/week   Barriers to discharge Decreased caregiver support      Co-evaluation               AM-PAC PT "6 Clicks" Mobility  Outcome Measure Help needed turning from your back to your side while in a flat bed without using bedrails?: None Help needed moving from lying on your back to sitting on the side of a flat bed without using bedrails?: A Little Help needed moving to and from a bed to a chair (including a wheelchair)?: A Little Help needed standing up from a chair using your arms (e.g., wheelchair or bedside chair)?: A Little Help needed to walk in hospital room?: A Little Help needed climbing 3-5 steps with a railing? : A Lot 6 Click Score: 18    End of Session Equipment Utilized During Treatment: Gait belt Activity Tolerance: Patient tolerated treatment well Patient left: in  bed;with call bell/phone within reach(sitting EOB ) Nurse Communication: Mobility status;Other (comment)(Pt IV leaking, and pt requesting bandage change) PT Visit Diagnosis: Other abnormalities of gait and mobility (R26.89);Difficulty in walking, not elsewhere classified (R26.2);Pain Pain - Right/Left: Left Pain - part of body: Ankle and joints of foot    Time: 4196-2229 PT Time Calculation (min) (ACUTE ONLY): 34 min   Charges:   PT Evaluation $PT Eval Low Complexity: 1 Low PT Treatments $Gait Training: 8-22 mins        Leighton Ruff, PT, DPT  Acute Rehabilitation Services  Pager: [phone] Office: [phone]   Rudean Hitt 05/08/2019, 5:24 PM

## 2019-05-08 NOTE — Progress Notes (Signed)
Patient ID: [name], male   DOB: 09/17/1950, 68 y.o.   MRN: [id number]  PROGRESS NOTE    NUNO BRUBACHER  FAO:[id number] DOB: 07/02/1951 DOA: 05/05/2019 PCP: [name], MD   Brief Narrative:  68 year old male with history of hypertension, hyperlipidemia, diabetes mellitus type II, prostate cancer status post prostatectomy in 2000, GERD, chronic kidney disease stage III, adrenal insufficiency, sarcoidosis, small bowel obstruction presented with nausea, vomiting and abdominal pain.  CT abdomen/pelvis showed small bowel obstruction.  General surgery was consulted.  He was also found to have bradycardia with an episode of complete heart block.  Cardiology was consulted.  Assessment & Plan:   Small bowel obstruction -Treated conservatively with IV fluids and NG tube.  NG tube subsequently has been removed.  He was started on clear liquid diet on 05/07/2019 in general surgery is planning to advance diet today.  Patient still has poor oral intake and is skeptical to go home today.  Had BMs last night but still having intermittent abdominal pain. -If tolerates advance diet and continues to have bowel movements, probable discharge home tomorrow  Complete heart block -Patient had an episode of bradycardia with complete heart block on presentation.  Cardiology following and suspecting that this happened most likely secondary to increased vagal tone in the setting of SBO. -Avoid nodal blocking agents.  No plan for pacemaker at this time. -Cardiology recommends 30-day event monitoring on discharge.  Outpatient follow-up with cardiology  Chronic disease stage III -Baseline creatinine 1.6-1.8.  Creatinine 2.01 today.  Repeat a.m. creatinine.  Chronic microcytic anemia  -Hemoglobin stable.  Monitor  Chronic diabetic left foot ulcer -Podiatry evaluation by Dr. March Rummage appreciated.  No signs of acute infection as per him.  Wound care as per his recommendations.  Continue doxycycline which was  started as an outpatient.  Diabetes mellitus type 2 Retinopathy from diabetes mellitus -Last A1c was 10.3 on 05/06/2019 -Blood sugars on the lower side.  Continue CBGs with SSI.  NovoLog 70/30 on hold.  Hypertension -Continue losartan and amlodipine.  Blood pressure improving.  Hold Lasix because of likely increasing creatinine.  History of adrenal insufficiency -Outpatient follow-up    DVT prophylaxis: Heparin Code Status: Full Family Communication: None at bedside Disposition Plan: Home in 1 to 2 days if clinically improves, tolerates diet better  Consultants: General surgery/cardiology  Procedures: None  Antimicrobials:  None  Subjective: Patient seen and examined at bedside.  He tolerated his clear liquid diet yesterday.  Had 2 BMs last night but had intermittent abdominal pain.  No overnight fever.  Objective: Vitals:   05/08/19 0025 05/08/19 0442 05/08/19 0822 05/08/19 1200  BP: (!) 140/52 (!) 133/53 135/63 (!) 173/67  Pulse: 64 (!) 59 60 60  Resp: 18 16 17  (!) 21  Temp: 98.3 F (36.8 C) 98.1 F (36.7 C) 98.3 F (36.8 C) 98.4 F (36.9 C)  TempSrc: Oral Oral Oral Oral  SpO2: 98% 98% 99% 98%  Weight:      Height:        Intake/Output Summary (Last 24 hours) at 05/08/2019 1451 Last data filed at 05/08/2019 1131 Gross per 24 hour  Intake 2123.62 ml  Output -  Net 2123.62 ml   Filed Weights   05/05/19 2121  Weight: 80 kg    Examination:  General exam: Appears calm and comfortable.  Elderly male lying in bed.  Respiratory system: Bilateral decreased breath sounds at bases with some scattered crackles Cardiovascular system: S1 & S2 heard, Rate controlled  Gastrointestinal system: Abdomen is nondistended, soft and nontender. Normal bowel sounds heard. Extremities: No cyanosis, clubbing; trace lower extremity edema     Data Reviewed: I have personally reviewed following labs and imaging studies  CBC: Recent Labs  Lab 05/05/19 2130 05/06/19 0520  05/07/19 0316 05/08/19 0344  WBC 3.6* 4.7 4.7 5.2  NEUTROABS 1.9  --   --   --   HGB 10.9* 10.5* 10.5* 8.8*  HCT 34.8* 35.2* 34.8* 28.5*  MCV 67.7* 67.7* 68.0* 66.7*  PLT 202 210 184 245   Basic Metabolic Panel: Recent Labs  Lab 05/05/19 2130 05/06/19 0520 05/07/19 0316 05/08/19 0344  NA 133* 136 141 138  K 4.2 4.8 4.1 3.7  CL 102 103 110 109  CO2 23 24 21* 25  GLUCOSE 127* 143* 86 119*  BUN 23 20 21  26*  CREATININE 1.69* 1.63* 1.82* 2.01*  CALCIUM 8.8* 8.8* 8.9 8.5*  MG 1.9  --   --   --    GFR: Estimated Creatinine Clearance: 35.2 mL/min (A) (by C-G formula based on SCr of 2.01 mg/dL (H)). Liver Function Tests: Recent Labs  Lab 05/05/19 2130 05/07/19 0316 05/08/19 0344  AST 33 30 22  ALT 28 22 20   ALKPHOS 77 59 49  BILITOT 1.1 1.2 1.1  PROT 6.9 6.3* 5.4*  ALBUMIN 3.4* 3.2* 2.7*   Recent Labs  Lab 05/05/19 2130  LIPASE 19   No results for input(s): AMMONIA in the last 168 hours. Coagulation Profile: Recent Labs  Lab 05/06/19 0748  INR 1.2   Cardiac Enzymes: No results for input(s): CKTOTAL, CKMB, CKMBINDEX, TROPONINI in the last 168 hours. BNP (last 3 results) No results for input(s): PROBNP in the last 8760 hours. HbA1C: Recent Labs    05/06/19 0520  HGBA1C 10.3*   CBG: Recent Labs  Lab 05/07/19 2006 05/08/19 0029 05/08/19 0433 05/08/19 0754 05/08/19 1157  GLUCAP 163* 130* 116* 108* 111*   Lipid Profile: Recent Labs    05/06/19 0520  CHOL 136  HDL 53  LDLCALC 74  TRIG 47  CHOLHDL 2.6   Thyroid Function Tests: Recent Labs    05/06/19 0520  TSH 1.370   Anemia Panel: No results for input(s): VITAMINB12, FOLATE, FERRITIN, TIBC, IRON, RETICCTPCT in the last 72 hours. Sepsis Labs: No results for input(s): PROCALCITON, LATICACIDVEN in the last 168 hours.  Recent Results (from the past 240 hour(s))  WOUND CULTURE     Status: Abnormal   Collection Time: 04/30/19 11:57 AM   Specimen: Wound  Result Value Ref Range Status    MICRO NUMBER: [id number]  Final   SPECIMEN QUALITY: Adequate  Final   SOURCE: WOUND (SITE NOT SPECIFIED)  Final   STATUS: ADDENDUM - FINAL  Corrected   GRAM STAIN: Gram positive cocci in chains  Final    Comment: No white blood cells seen No epithelial cells seen Moderate Gram positive cocci in chains Few Gram positive bacilli   ISOLATE 1: Streptococcus agalactiae (A)  Corrected    Comment: Heavy growth of Group B Streptococcus isolated      Susceptibility   Streptococcus agalactiae - AEROBIC CULT, GRAM STAIN POSITIVE 1    AMPICILLIN <=0.25 Sensitive     CEFOTAXIME <=0.12 Sensitive     CEFTRIAXONE <=0.12 Sensitive     VANCOMYCIN 0.5 Sensitive     CLINDAMYCIN <=0.25 Sensitive     LEVOFLOXACIN 1 Sensitive     PENO - penicillin* 0.12 Sensitive      * Legend:S = Susceptible  I = IntermediateR = Resistant  NS = Not susceptible* = Not tested  NR = Not reported**NN = See antimicrobic comments  SARS CORONAVIRUS 2 (TAT 6-24 HRS) Nasopharyngeal Nasopharyngeal Swab     Status: None   Collection Time: 05/05/19 10:47 PM   Specimen: Nasopharyngeal Swab  Result Value Ref Range Status   SARS Coronavirus 2 NEGATIVE NEGATIVE Final    Comment: (NOTE) SARS-CoV-2 target nucleic acids are NOT DETECTED. The SARS-CoV-2 RNA is generally detectable in upper and lower respiratory specimens during the acute phase of infection. Negative results do not preclude SARS-CoV-2 infection, do not rule out co-infections with other pathogens, and should not be used as the sole basis for treatment or other patient management decisions. Negative results must be combined with clinical observations, patient history, and epidemiological information. The expected result is Negative. Fact Sheet for Patients: SugarRoll.be Fact Sheet for Healthcare Providers: https://www.woods-mathews.com/ This test is not yet approved or cleared by the [place] FDA and  has been authorized for  detection and/or diagnosis of SARS-CoV-2 by FDA under an Emergency Use Authorization (EUA). This EUA will remain  in effect (meaning this test can be used) for the duration of the COVID-19 declaration under Section 56 4(b)(1) of the Act, 21 U.S.C. section 360bbb-3(b)(1), unless the authorization is terminated or revoked sooner. Performed at [facility], [place] [address], [place], [place] [address]   Culture, blood (Routine X 2) w Reflex to ID Panel     Status: None (Preliminary result)   Collection Time: 05/06/19  5:00 AM   Specimen: BLOOD LEFT ARM  Result Value Ref Range Status   Specimen Description BLOOD LEFT ARM  Final   Special Requests   Final    BOTTLES DRAWN AEROBIC AND ANAEROBIC Blood Culture results may not be optimal due to an excessive volume of blood received in culture bottles   Culture   Final    NO GROWTH 2 DAYS Performed at [facility], [place] [address] [place], [place] [address]    Report Status PENDING  Incomplete  Culture, blood (Routine X 2) w Reflex to ID Panel     Status: None (Preliminary result)   Collection Time: 05/06/19  5:20 AM   Specimen: BLOOD LEFT HAND  Result Value Ref Range Status   Specimen Description BLOOD LEFT HAND  Final   Special Requests   Final    BOTTLES DRAWN AEROBIC ONLY Blood Culture results may not be optimal due to an excessive volume of blood received in culture bottles   Culture   Final    NO GROWTH 2 DAYS Performed at [facility], [place] [address], [place], [place] [address]    Report Status PENDING  Incomplete         Radiology Studies: Dg Abd Portable 1v  Result Date: 05/08/2019 CLINICAL DATA:  Tenderness.  History of small-bowel obstruction. EXAM: PORTABLE ABDOMEN - 1 VIEW COMPARISON:  05/07/2019.  CT 05/05/2019. FINDINGS: No bowel distention. Oral contrast noted the colon, decreased from prior exam. No free air. Surgical clips in the pelvis. No acute bony abnormality. Degenerative change lumbar spine and  both hips. IMPRESSION: 1. No bowel distention. Decrease in oral contrast noted in the colon. 2.  No acute abnormality identified. Electronically Signed   By: Marcello Moores  Register   On: 05/08/2019 10:17   Dg Abd Portable 1v  Result Date: 05/07/2019 CLINICAL DATA:  NG tube placement. EXAM: PORTABLE ABDOMEN - 1 VIEW COMPARISON:  05/06/2019.  CT  05/05/2019. FINDINGS: No NG tube noted. Oral contrast noted throughout the colon. No bowel distention. No free air. Pelvic clips are noted. IMPRESSION: No NG tube noted. Oral contrast noted throughout the colon. No bowel distention. No free air. Electronically Signed   By: Marcello Moores  Register   On: 05/07/2019 06:57   Dg Abd Portable 1v-small Bowel Obstruction Protocol-initial, 8 Hr Delay  Result Date: 05/06/2019 CLINICAL DATA:  Small-bowel obstruction. 8 hour delayed examination. EXAM: PORTABLE ABDOMEN - 1 VIEW COMPARISON:  Earlier same day; CT abdomen pelvis-05/05/2019 FINDINGS: Interval transit of ingested contrast, now seen throughout the colon to the level of the rectum. A small amount of contrast remains within the terminal ileum. Enteric tube tip and side port projected the expected location of the gastric fundus. There is no significant gaseous distension of the upstream small bowel. No supine evidence of pneumoperitoneum. No pneumatosis or portal venous gas Limited visualization of lower thorax demonstrates an enlarged cardiac silhouette. No acute or aggressive osseous abnormalities. Severe degenerative change the right hip is suspected though incompletely evaluated. Post prostatectomy. IMPRESSION: 1. Enteric contrast now seen throughout the colon to the level the rectum. No evidence of enteric obstruction. 2. Enteric tube tip and side port project over the expected location of the gastric fundus. Electronically Signed   By: Sandi Mariscal M.D.   On: 05/06/2019 19:37        Scheduled Meds: . amLODipine  5 mg Oral Daily  . aspirin  81 mg Oral q morning - 10a  .  atorvastatin  20 mg Oral q morning - 10a  . furosemide  40 mg Oral Daily  . gabapentin  300 mg Oral TID  . heparin injection (subcutaneous)  5,000 Units Subcutaneous Q8H  . insulin aspart  0-9 Units Subcutaneous TID WC  . losartan  50 mg Oral Daily   Continuous Infusions: . sodium chloride 250 mL (05/08/19 0625)  . dextrose Stopped (05/07/19 1900)  . doxycycline (VIBRAMYCIN) IV 100 mg (05/08/19 2876)     LOS: 2 days        Aline August, MD [facility] 05/08/2019, 2:51 PM

## 2019-05-09 ENCOUNTER — Other Ambulatory Visit: Payer: Self-pay

## 2019-05-09 ENCOUNTER — Inpatient Hospital Stay (HOSPITAL_COMMUNITY): Payer: Medicare Other

## 2019-05-09 ENCOUNTER — Encounter (HOSPITAL_COMMUNITY): Payer: Self-pay | Admitting: *Deleted

## 2019-05-09 LAB — CBC
HCT: 28.1 % — ABNORMAL LOW (ref 39.0–52.0)
Hemoglobin: 8.5 g/dL — ABNORMAL LOW (ref 13.0–17.0)
MCH: 20.5 pg — ABNORMAL LOW (ref 26.0–34.0)
MCHC: 30.2 g/dL (ref 30.0–36.0)
MCV: 67.7 fL — ABNORMAL LOW (ref 80.0–100.0)
Platelets: 148 10*3/uL — ABNORMAL LOW (ref 150–400)
RBC: 4.15 MIL/uL — ABNORMAL LOW (ref 4.22–5.81)
RDW: 17.2 % — ABNORMAL HIGH (ref 11.5–15.5)
WBC: 3.2 10*3/uL — ABNORMAL LOW (ref 4.0–10.5)
nRBC: 0 % (ref 0.0–0.2)

## 2019-05-09 LAB — COMPREHENSIVE METABOLIC PANEL
ALT: 21 U/L (ref 0–44)
AST: 24 U/L (ref 15–41)
Albumin: 2.6 g/dL — ABNORMAL LOW (ref 3.5–5.0)
Alkaline Phosphatase: 57 U/L (ref 38–126)
Anion gap: 6 (ref 5–15)
BUN: 30 mg/dL — ABNORMAL HIGH (ref 8–23)
CO2: 24 mmol/L (ref 22–32)
Calcium: 8.3 mg/dL — ABNORMAL LOW (ref 8.9–10.3)
Chloride: 107 mmol/L (ref 98–111)
Creatinine, Ser: 2.16 mg/dL — ABNORMAL HIGH (ref 0.61–1.24)
GFR calc Af Amer: 35 mL/min — ABNORMAL LOW (ref >60)
GFR calc non Af Amer: 30 mL/min — ABNORMAL LOW (ref >60)
Glucose, Bld: 233 mg/dL — ABNORMAL HIGH (ref 70–99)
Potassium: 3.9 mmol/L (ref 3.5–5.1)
Sodium: 137 mmol/L (ref 135–145)
Total Bilirubin: 0.5 mg/dL (ref 0.3–1.2)
Total Protein: 5.4 g/dL — ABNORMAL LOW (ref 6.5–8.1)

## 2019-05-09 LAB — GLUCOSE, CAPILLARY
Glucose-Capillary: 199 mg/dL — ABNORMAL HIGH (ref 70–99)
Glucose-Capillary: 136 mg/dL — ABNORMAL HIGH (ref 70–99)
Glucose-Capillary: 143 mg/dL — ABNORMAL HIGH (ref 70–99)

## 2019-05-09 MED ORDER — FUROSEMIDE 40 MG PO TABS: 40.0000 mg | ORAL_TABLET | Freq: Every day | ORAL | 0 refills | Status: DC

## 2019-05-09 MED ORDER — SENNA 8.6 MG PO TABS: 1.0000 | ORAL_TABLET | Freq: Two times a day (BID) | ORAL | 0 refills | Status: DC

## 2019-05-09 MED ORDER — IPRATROPIUM-ALBUTEROL 0.5-2.5 (3) MG/3ML IN SOLN
3.0000 mL | Freq: Once | RESPIRATORY_TRACT | Status: AC
  Administered 2019-05-09: 3 mL via RESPIRATORY_TRACT
  Filled 2019-05-09: qty 3

## 2019-05-09 MED ORDER — POLYETHYLENE GLYCOL 3350 17 G PO PACK: 17.0000 g | PACK | Freq: Every day | ORAL | Status: DC | PRN

## 2019-05-09 MED ORDER — ALBUTEROL SULFATE HFA 108 (90 BASE) MCG/ACT IN AERS: 2.0000 | INHALATION_SPRAY | RESPIRATORY_TRACT | 0 refills | Status: DC | PRN

## 2019-05-09 MED ORDER — POLYETHYLENE GLYCOL 3350 17 G PO PACK: 17.0000 g | PACK | Freq: Every day | ORAL | 0 refills | Status: DC | PRN

## 2019-05-09 MED ORDER — TRAMADOL HCL 50 MG PO TABS: 50.0000 mg | ORAL_TABLET | Freq: Four times a day (QID) | ORAL | 0 refills | Status: DC | PRN

## 2019-05-09 MED ORDER — AMLODIPINE BESYLATE 5 MG PO TABS: 5.0000 mg | ORAL_TABLET | Freq: Every day | ORAL | 0 refills | Status: DC

## 2019-05-09 MED ORDER — ONDANSETRON HCL 4 MG PO TABS: 4.0000 mg | ORAL_TABLET | Freq: Every day | ORAL | 0 refills | Status: DC | PRN

## 2019-05-09 MED ORDER — INSULIN ISOPHANE & REGULAR (HUMAN 70-30)100 UNIT/ML KWIKPEN: PEN_INJECTOR | SUBCUTANEOUS | Status: DC

## 2019-05-09 MED ORDER — DOXYCYCLINE HYCLATE 100 MG PO TABS
100.0000 mg | ORAL_TABLET | Freq: Two times a day (BID) | ORAL | Status: DC
  Administered 2019-05-09: 100 mg via ORAL
  Filled 2019-05-09: qty 1

## 2019-05-09 NOTE — Progress Notes (Signed)
Patient was discharged home with home health by MD order; discharged instructions  review and give to patient with care notes; IV DIC; patient will be escorted to the car by nurse tech via wheelchair.  

## 2019-05-09 NOTE — Progress Notes (Signed)
Patient's family came and take the patient's home. Writer took the patient to main entrance.

## 2019-05-09 NOTE — TOC Transition Note (Signed)
Transition of Care [facility]) - CM/SW Discharge Note   Patient Details  Name: [name] MRN: [id number] Date of Birth: Feb 26, 1951  [facility]) CM/SW Contact:  Ralph Dowdy, [place] Work Phone Number: 05/09/2019, 1:24 PM   Clinical Narrative:    CSW spoke with pt about home health agencies. Pt stated that he had used [facility] before and wanted to use [facility] again for home health. Pt stated that his wife put a restraining order on him and that his home address needed to be corrected. The address where he will be getting home health services at is [address] [place], [place]. Pt's primary contact # is: [phone].  CSW made contact to [facility] and they accepted him for in home services. CSW contacted [facility] to update his address.     Final next level of care: [place] Barriers to Discharge: No Barriers Identified   Patient Goals and CMS Choice Patient states their goals for this hospitalization and ongoing recovery are:: Return to work [employer].gov Compare Post Acute Care list provided to:: Patient Choice offered to / list presented to : Patient  Discharge Placement                       Discharge Plan and Services   Discharge Planning Services: CM Consult Post Acute Care Choice: Home Health, Durable Medical Equipment          DME Arranged: Walker rolling DME Agency: [employer] Date DME Agency Contacted: 05/09/19 Time DME Agency Contacted: [id number] Representative spoke with at DME Agency: [place]: PT [place]: [place] Date [place]: 05/09/19 Time [place]: 6761 Representative spoke with at [place]: [place] ([place]) Interventions     Readmission Risk Interventions Readmission Risk Prevention Plan 05/09/2019  Transportation Screening Complete  PCP or Specialist Appt within 3-5 Days Complete  HRI or Early Complete   Social Work Consult for [place] Planning/Counseling Complete  Palliative Care Screening Not Applicable  Medication Review Press photographer) Complete  Some recent data might be hidden

## 2019-05-09 NOTE — Progress Notes (Signed)
Patient is waiting for his ride. 5C will call when a bed will be available. Will continue to monitor.

## 2019-05-09 NOTE — Discharge Summary (Signed)
Physician Discharge Summary  [name] UEK:[id number] DOB: 02-03-1951 DOA: 05/05/2019  PCP: [name], MD  Admit date: 05/05/2019 Discharge date: 05/09/2019  Admitted From: Home Disposition: Home  Recommendations for Outpatient Follow-up:  1. Follow up with PCP in 1 week with repeat CBC/BMP 2. Outpatient follow-up with cardiology.  Cardiology team to arrange outpatient 30-day cardiac monitoring 3. Outpatient follow-up with podiatry.  Wound care as per podiatry recommendations. 4. Follow up in ED if symptoms worsen or new appear   Home Health: Home health PT Equipment/Devices: None  Discharge Condition: Stable CODE STATUS: Full Diet recommendation: Heart healthy/carb modified  Brief/Interim Summary: 68 year old male with history of hypertension, hyperlipidemia, diabetes mellitus type II, prostate cancer status post prostatectomy in 2000, GERD, chronic kidney disease stage III, adrenal insufficiency, sarcoidosis, small bowel obstruction presented with nausea, vomiting and abdominal pain.  CT abdomen/pelvis showed small bowel obstruction.  General surgery was consulted.  He was also found to have bradycardia with an episode of complete heart block.  Cardiology was consulted.  Patient had conservative treatment of his bowel obstruction with n.p.o. initially along with NG tube.  Subsequently, NG tube was removed and patient was started on a diet which was then advanced.  He is tolerating diet and having bowel movements.  General surgery has signed off.  Cardiology will arrange for outpatient 30-day cardiac monitoring.  He will be discharged home today.  Discharge Diagnoses:   Small bowel obstruction -Treated conservatively with IV fluids and NG tube.  NG tube subsequently has been removed.  He was started on clear liquid diet on 05/07/2019 which was subsequently advanced to soft diet on 05/08/2019.  He is tolerating diet and is having bowel movements.  Feels better enough to go home  today.  Repeat x-ray of the abdomen done today as well shows no evidence of bowel obstruction.  -We will discharge on stool softeners and as needed laxatives.  Complete heart block -Patient had an episode of bradycardia with complete heart block on presentation.  Cardiology evaluated the patient: Suspecting that this happened most likely secondary to increased vagal tone in the setting of SBO. -Avoid nodal blocking agents.  No plan for pacemaker at this time. -Cardiology recommends 30-day event monitoring on discharge.  This is being arranged by cardiology. Outpatient follow-up with cardiology  Chronic disease stage III -Baseline creatinine 1.6-1.9.  Creatinine 2.16 today.    Outpatient follow-up.  Chronic microcytic anemia  -Hemoglobin stable.  Monitor outpatient.  Chronic diabetic left foot ulcer -Podiatry evaluation by Dr. March Rummage appreciated.  No signs of acute infection as per him.  Wound care as per his recommendations.  Continue doxycycline which was started as an outpatient. -Outpatient follow-up with Dr. March Rummage.  Diabetes mellitus type 2 Retinopathy from diabetes mellitus -Last A1c was 10.3 on 05/06/2019 -Continue home regimen.  Hypertension -Blood pressure improving.  Will resume amlodipine on discharge.  Patient apparently has been out of Lasix for the last few weeks.  Cardiology had recommended to resume Lasix.  Will resume Lasix on 05/13/2019.  Hold losartan for now as creatinine has slightly creep up.  BMP is to be followed up as an outpatient by PCP and doses of antihypertensives can be adjusted accordingly.  History of adrenal insufficiency -Outpatient follow-up   Discharge Instructions  Discharge Instructions    Diet - low sodium heart healthy   Complete by: As directed    Diet Carb Modified   Complete by: As directed    Increase activity slowly  Complete by: As directed      Allergies as of 05/09/2019      Reactions   Ace Inhibitors Itching, Cough    Naproxen Anaphylaxis, Shortness Of Breath, Other (See Comments)   Throat swells. cannot breathe, and causes GI distress      Medication List    STOP taking these medications   chlorthalidone 25 MG tablet Commonly known as: HYGROTON   HYDROcodone-acetaminophen 5-325 MG tablet Commonly known as: NORCO/VICODIN   losartan 50 MG tablet Commonly known as: COZAAR   methocarbamol 500 MG tablet Commonly known as: ROBAXIN   oxyCODONE 5 MG immediate release tablet Commonly known as: Oxy IR/ROXICODONE     TAKE these medications   acetaminophen 650 MG CR tablet Commonly known as: TYLENOL Take 650 mg by mouth every 8 (eight) hours as needed for pain.   albuterol 108 (90 Base) MCG/ACT inhaler Commonly known as: VENTOLIN HFA Inhale 2 puffs into the lungs every 4 (four) hours as needed for wheezing or shortness of breath.   amLODipine 5 MG tablet Commonly known as: NORVASC Take 1 tablet (5 mg total) by mouth daily.   aspirin 81 MG chewable tablet Chew 81 mg by mouth every morning.   atorvastatin 20 MG tablet Commonly known as: LIPITOR Take 20 mg by mouth every morning.   blood glucose meter kit and supplies Kit Dispense based on patient and insurance preference. Use up to four times daily as directed. (FOR ICD-9 250.00, 250.01).   doxycycline 100 MG tablet Commonly known as: VIBRA-TABS Take 1 tablet (100 mg total) by mouth 2 (two) times daily for 14 days.   freestyle lancets Use as instructed   Lancets 30G Misc   furosemide 40 MG tablet Commonly known as: LASIX Take 1 tablet (40 mg total) by mouth daily. Start taking on: May 13, 2019 What changed: These instructions start on May 13, 2019. If you are unsure what to do until then, ask your doctor or other care provider.   gabapentin 300 MG capsule Commonly known as: NEURONTIN TAKE 1 CAPSULE(300 MG) BY MOUTH THREE TIMES DAILY What changed: See the new instructions.   Insulin Isophane & Regular Human (70-30)  100 UNIT/ML PEN Commonly known as: HUMULIN 70/30 MIX Inject 20 Units into the skin daily before breakfast AND 16-26 Units daily before supper.   multivitamin with minerals Tabs tablet Take 1 tablet by mouth every morning. Centrum   ondansetron 4 MG tablet Commonly known as: Zofran Take 1 tablet (4 mg total) by mouth daily as needed for nausea or vomiting.   PEN NEEDLES 31GX5/16" 31G X 8 MM Misc Use new needle with each insulin injection, twice a day. Dx E11.65, Z79.4   polyethylene glycol 17 g packet Commonly known as: MIRALAX / GLYCOLAX Take 17 g by mouth daily as needed for moderate constipation.   senna 8.6 MG Tabs tablet Commonly known as: SENOKOT Take 1 tablet (8.6 mg total) by mouth 2 (two) times daily.   SYSTANE OP Place 1 drop into both eyes daily as needed (dry eyes).   traMADol 50 MG tablet Commonly known as: ULTRAM Take 1 tablet (50 mg total) by mouth every 6 (six) hours as needed for moderate pain (pain). What changed: reasons to take this       Follow-up Information    O'Neal, Cassie Freer, MD Follow up on 06/24/2019.   Specialties: Internal Medicine, Cardiology Why: Please arrive 15 minutes early for your 11am post-hospital cardiology appointment.  Contact information: [place]  [place] [address] [phone]        [name], MD. Schedule an appointment as soon as possible for a visit in 1 week(s).   Specialty: Family Medicine Why: With repeat CBC/BMP Contact information: [address] [place] [address] [phone]        Evelina Bucy, DPM. Schedule an appointment as soon as possible for a visit in 1 week(s).   Specialty: Podiatry Contact information: [address] [address] [phone]          Allergies  Allergen Reactions  . Ace Inhibitors Itching and Cough  . Naproxen Anaphylaxis, Shortness Of Breath and Other (See Comments)    Throat swells. cannot breathe, and causes GI distress     Consultations:  General surgery/cardiology   Procedures/Studies: Dg Abd 1 View  Result Date: 05/06/2019 CLINICAL DATA:  NG tube placement. EXAM: ABDOMEN - 1 VIEW COMPARISON:  CT yesterday. FINDINGS: Tip of the enteric tube below the diaphragm in the stomach, side-port in the region of the gastroesophageal junction. Dilated small bowel on prior CT not as well visualized radiographically. Stool within the colon. Excreted IV contrast in the urinary bladder from prior CT. Multiple surgical clips in the pelvis. Unchanged osseous structures. IMPRESSION: 1. Tip of the enteric tube below the diaphragm in the stomach, side-port in the region of the gastroesophageal junction. Advancement of 3 cm would lead to optimal positioning. 2. Dilated small bowel on prior CT not as well visualized radiographically. Electronically Signed   By: Keith Rake M.D.   On: 05/06/2019 03:58   Ct Abdomen Pelvis W Contrast  Result Date: 05/05/2019 CLINICAL DATA:  Initial evaluation for acute nausea, vomiting, abdominal pain. History of previous SBO. EXAM: CT ABDOMEN AND PELVIS WITH CONTRAST TECHNIQUE: Multidetector CT imaging of the abdomen and pelvis was performed using the standard protocol following bolus administration of intravenous contrast. CONTRAST:  147m OMNIPAQUE IOHEXOL 300 MG/ML  SOLN COMPARISON:  Prior CT from 12/29/2016. FINDINGS: Lower chest: Mild subsegmental atelectatic changes noted within the visualized lung bases. 9 mm nodular density along the left hemidiaphragm is little interval change as compared to previous study from 2018, most likely a benign finding. Visualized lungs are otherwise clear. Hepatobiliary: Liver demonstrates a normal contrast enhanced appearance. Gallbladder within normal limits. No biliary dilatation. Pancreas: Subcentimeter cystic lesion within the mid pancreatic body noted, grossly stable from previous, likely benign. Pancreas otherwise unremarkable. No pancreatic ductal  dilatation or acute peripancreatic inflammation. Spleen: Spleen within normal limits. Adrenals/Urinary Tract: Adrenal glands are normal. Kidneys equal in size with symmetric enhancement. 18 mm cyst noted at the upper pole of the right kidney, similar to previous. No nephrolithiasis, hydronephrosis, or focal enhancing renal mass. No visible hydroureter. Partially distended bladder within normal limits. Stomach/Bowel: Stomach within normal limits. Multiple mildly dilated fluid-filled loops of bowel seen clustered within the upper left and mid abdomen. Focal transition point within the mid abdomen present (series 3, image 28). Small bowel is decompressed distally. Finding consistent with acute mechanical small bowel obstruction. Underlying adhesive disease is suspected. No associated pneumatosis or portal venous gas. No other acute inflammatory changes about the small bowel. Moderate retained stool seen diffusely throughout the colon, suggesting constipation. Appendix within normal limits. Vascular/Lymphatic: Normal intravascular enhancement seen throughout the intra-abdominal aorta. Mesenteric vessels patent proximally. Mild aorto bi-iliac atherosclerotic disease. No pathologically enlarged intra-abdominopelvic lymph nodes. Mildly prominent bilateral inguinal lymph nodes measure up to 13 mm in short axis, within normal limits. Reproductive: Prior prostatectomy. Other:  No free air or fluid. Musculoskeletal: No acute osseous abnormality. No discrete lytic or blastic osseous lesions. Moderate degenerative spondylolysis present at L3-4 through L5-S1. Osteoarthritic changes noted about the hips bilaterally. IMPRESSION: 1. Findings consistent with acute mechanical small bowel obstruction with transition point within the mid abdomen as above. 2. No other acute intra-abdominal or pelvic process. 3. Moderate retained stool diffusely throughout the colon, suggesting constipation. 4. Prior prostatectomy. Electronically Signed    By: Jeannine Boga M.D.   On: 05/05/2019 23:33   Dg Chest Port 1 View  Result Date: 05/09/2019 CLINICAL DATA:  Shortness of breath EXAM: PORTABLE CHEST 1 VIEW COMPARISON:  05/05/2019 FINDINGS: Cardiomegaly. Both lungs are clear. The visualized skeletal structures are unremarkable. IMPRESSION: Cardiomegaly without acute abnormality of the lungs in AP portable projection. Electronically Signed   By: Eddie Candle M.D.   On: 05/09/2019 10:03   Dg Chest Portable 1 View  Result Date: 05/05/2019 CLINICAL DATA:  Abdominal pain. Vomiting. EXAM: PORTABLE CHEST 1 VIEW COMPARISON:  10/19/2018 FINDINGS: Borderline cardiomegaly with unchanged mediastinal contours. Pulmonary vasculature is normal. No consolidation, pleural effusion, or pneumothorax. No acute osseous abnormalities are seen. IMPRESSION: Borderline cardiomegaly.  No acute pulmonary process. Electronically Signed   By: Keith Rake M.D.   On: 05/05/2019 22:16   Dg Abd Portable 1v  Result Date: 05/09/2019 CLINICAL DATA:  68 year old male with history of resolving small bowel obstruction. EXAM: PORTABLE ABDOMEN - 1 VIEW COMPARISON:  Abdominal radiograph 05/08/2019. FINDINGS: Gas, stool and oral contrast material is noted throughout the colon and distal rectum. No pathologic dilatation of small bowel. No pneumoperitoneum noted on this supine image. Surgical clips projecting over the low anatomic pelvis. IMPRESSION: 1. Nonobstructive bowel gas pattern. 2. No pneumoperitoneum. Electronically Signed   By: Vinnie Langton M.D.   On: 05/09/2019 10:07   Dg Abd Portable 1v  Result Date: 05/08/2019 CLINICAL DATA:  Tenderness.  History of small-bowel obstruction. EXAM: PORTABLE ABDOMEN - 1 VIEW COMPARISON:  05/07/2019.  CT 05/05/2019. FINDINGS: No bowel distention. Oral contrast noted the colon, decreased from prior exam. No free air. Surgical clips in the pelvis. No acute bony abnormality. Degenerative change lumbar spine and both hips. IMPRESSION: 1.  No bowel distention. Decrease in oral contrast noted in the colon. 2.  No acute abnormality identified. Electronically Signed   By: Marcello Moores  Register   On: 05/08/2019 10:17   Dg Abd Portable 1v  Result Date: 05/07/2019 CLINICAL DATA:  NG tube placement. EXAM: PORTABLE ABDOMEN - 1 VIEW COMPARISON:  05/06/2019.  CT 05/05/2019. FINDINGS: No NG tube noted. Oral contrast noted throughout the colon. No bowel distention. No free air. Pelvic clips are noted. IMPRESSION: No NG tube noted. Oral contrast noted throughout the colon. No bowel distention. No free air. Electronically Signed   By: Marcello Moores  Register   On: 05/07/2019 06:57   Dg Abd Portable 1v-small Bowel Obstruction Protocol-initial, 8 Hr Delay  Result Date: 05/06/2019 CLINICAL DATA:  Small-bowel obstruction. 8 hour delayed examination. EXAM: PORTABLE ABDOMEN - 1 VIEW COMPARISON:  Earlier same day; CT abdomen pelvis-05/05/2019 FINDINGS: Interval transit of ingested contrast, now seen throughout the colon to the level of the rectum. A small amount of contrast remains within the terminal ileum. Enteric tube tip and side port projected the expected location of the gastric fundus. There is no significant gaseous distension of the upstream small bowel. No supine evidence of pneumoperitoneum. No pneumatosis or portal venous gas Limited visualization of lower thorax demonstrates an enlarged cardiac silhouette. No  acute or aggressive osseous abnormalities. Severe degenerative change the right hip is suspected though incompletely evaluated. Post prostatectomy. IMPRESSION: 1. Enteric contrast now seen throughout the colon to the level the rectum. No evidence of enteric obstruction. 2. Enteric tube tip and side port project over the expected location of the gastric fundus. Electronically Signed   By: Sandi Mariscal M.D.   On: 05/06/2019 19:37   Dg Abd Portable 1v-small Bowel Protocol-position Verification  Result Date: 05/06/2019 CLINICAL DATA:  NG tube placement. Small  bowel obstruction. EXAM: PORTABLE ABDOMEN - 1 VIEW 10:45 a.m. COMPARISON:  05/06/2019 at 3:40 a.m. FINDINGS: NG tube tip is in the body of the stomach. Side hole is near the gastroesophageal junction as previously described. The visualized bowel gas pattern is normal. IMPRESSION: No significant change in the position of the nasogastric tube. The side hole is near the gastroesophageal junction. Electronically Signed   By: Lorriane Shire M.D.   On: 05/06/2019 10:59   Dg Foot Complete Left  Result Date: 04/30/2019 Please see detailed radiograph report in office note.  Vas [place] Lower Extremity Venous (dvt)  Result Date: 05/06/2019  Lower Venous Study Indications: Swelling, and Pain.  Limitations: Body habitus, poor ultrasound/tissue interface and bandages. Comparison Study: 10/19/2018- negative left lower extremity venous duplex. Performing Technologist: Maudry Mayhew MHA, RDMS, RVT, RDCS  Examination Guidelines: A complete evaluation includes B-mode imaging, spectral Doppler, color Doppler, and power Doppler as needed of all accessible portions of each vessel. Bilateral testing is considered an integral part of a complete examination. Limited examinations for reoccurring indications may be performed as noted.  Right CFV not visualized.  +---------+---------------+---------+-----------+----------+--------------+ LEFT     CompressibilityPhasicitySpontaneityPropertiesThrombus Aging +---------+---------------+---------+-----------+----------+--------------+ CFV      Full           No       Yes                                 +---------+---------------+---------+-----------+----------+--------------+ SFJ      Full                                                        +---------+---------------+---------+-----------+----------+--------------+ FV Prox  Full                                                        +---------+---------------+---------+-----------+----------+--------------+  FV Mid   Full                                                        +---------+---------------+---------+-----------+----------+--------------+ FV DistalFull                                                        +---------+---------------+---------+-----------+----------+--------------+ PFV      Full                                                        +---------+---------------+---------+-----------+----------+--------------+  POP      Full           Yes      Yes                                 +---------+---------------+---------+-----------+----------+--------------+ PTV      Full                    Yes                                 +---------+---------------+---------+-----------+----------+--------------+ Unable to adequately visualize the left peroneal veins. Limited visualization of the left posterior tibial veins.  Summary: Left: There is no evidence of deep vein thrombosis in the lower extremity. However, portions of this examination were limited- see technologist comments above. No cystic structure found in the popliteal fossa. Ultrasound characteristics of enlarged lymph  nodes noted in the groin.  *See table(s) above for measurements and observations. Electronically signed by Monica Martinez MD on 05/06/2019 at 4:29:48 PM.    Final     Echo IMPRESSIONS    1. Left ventricular ejection fraction, by visual estimation, is 60 to 65%. The left ventricle has normal function. Normal left ventricular size. Mildly increased left ventricular posterior wall thickness. There is no left ventricular hypertrophy.  2. Left ventricular diastolic Doppler parameters are consistent with impaired relaxation pattern of LV diastolic filling.  3. Global right ventricle has normal systolic function.The right ventricular size is normal. No increase in right ventricular wall thickness.  4. Left atrial size was normal.  5. Right atrial size was normal.  6. The mitral valve is  normal in structure. Mild mitral valve regurgitation. No evidence of mitral stenosis.  7. The tricuspid valve is normal in structure. Tricuspid valve regurgitation was not visualized by color flow Doppler.  8. The aortic valve is normal in structure. Aortic valve regurgitation is mild by color flow Doppler. Structurally normal aortic valve, with no evidence of sclerosis or stenosis.  9. The pulmonic valve was normal in structure. Pulmonic valve regurgitation is not visualized by color flow Doppler. 10. The inferior vena cava is normal in size with greater than 50% respiratory variability, suggesting right atrial pressure of 3 mmHg.   Subjective: Patient seen and examined at bedside.  He complains of some sore throat and shortness of breath.  Tolerating diet.  Has had intermittent abdominal pain but is having bowel movement.  No overnight fever or vomiting  Discharge Exam: Vitals:   05/09/19 0523 05/09/19 0909  BP: (!) 160/61   Pulse: 66 68  Resp:  18  Temp: 98.1 F (36.7 C)   SpO2: 99% 99%    General: Pt is alert, awake, not in acute distress Cardiovascular: rate controlled, S1/S2 + Respiratory: bilateral decreased breath sounds at bases Abdominal: Soft, NT, ND, bowel sounds + Extremities: Trace edema, no cyanosis    The results of significant diagnostics from this hospitalization (including imaging, microbiology, ancillary and laboratory) are listed below for reference.     Microbiology: Recent Results (from the past 240 hour(s))  WOUND CULTURE     Status: Abnormal   Collection Time: 04/30/19 11:57 AM   Specimen: Wound  Result Value Ref Range Status   MICRO NUMBER: [id number]  Final   SPECIMEN QUALITY: Adequate  Final   SOURCE: WOUND (SITE NOT SPECIFIED)  Final  STATUS: ADDENDUM - FINAL  Corrected   GRAM STAIN: Gram positive cocci in chains  Final    Comment: No white blood cells seen No epithelial cells seen Moderate Gram positive cocci in chains Few Gram positive bacilli    ISOLATE 1: Streptococcus agalactiae (A)  Corrected    Comment: Heavy growth of Group B Streptococcus isolated      Susceptibility   Streptococcus agalactiae - AEROBIC CULT, GRAM STAIN POSITIVE 1    AMPICILLIN <=0.25 Sensitive     CEFOTAXIME <=0.12 Sensitive     CEFTRIAXONE <=0.12 Sensitive     VANCOMYCIN 0.5 Sensitive     CLINDAMYCIN <=0.25 Sensitive     LEVOFLOXACIN 1 Sensitive     PENO - penicillin* 0.12 Sensitive      * Legend:S = Susceptible  I = IntermediateR = Resistant  NS = Not susceptible* = Not tested  NR = Not reported**NN = See antimicrobic comments  SARS CORONAVIRUS 2 (TAT 6-24 HRS) Nasopharyngeal Nasopharyngeal Swab     Status: None   Collection Time: 05/05/19 10:47 PM   Specimen: Nasopharyngeal Swab  Result Value Ref Range Status   SARS Coronavirus 2 NEGATIVE NEGATIVE Final    Comment: (NOTE) SARS-CoV-2 target nucleic acids are NOT DETECTED. The SARS-CoV-2 RNA is generally detectable in upper and lower respiratory specimens during the acute phase of infection. Negative results do not preclude SARS-CoV-2 infection, do not rule out co-infections with other pathogens, and should not be used as the sole basis for treatment or other patient management decisions. Negative results must be combined with clinical observations, patient history, and epidemiological information. The expected result is Negative. Fact Sheet for Patients: SugarRoll.be Fact Sheet for Healthcare Providers: https://www.woods-mathews.com/ This test is not yet approved or cleared by the [place] FDA and  has been authorized for detection and/or diagnosis of SARS-CoV-2 by FDA under an Emergency Use Authorization (EUA). This EUA will remain  in effect (meaning this test can be used) for the duration of the COVID-19 declaration under Section 56 4(b)(1) of the Act, 21 U.S.C. section 360bbb-3(b)(1), unless the authorization is terminated or revoked  sooner. Performed at [facility], [place] [address], [place], [place] [address]   Culture, blood (Routine X 2) w Reflex to ID Panel     Status: None (Preliminary result)   Collection Time: 05/06/19  5:00 AM   Specimen: BLOOD LEFT ARM  Result Value Ref Range Status   Specimen Description BLOOD LEFT ARM  Final   Special Requests   Final    BOTTLES DRAWN AEROBIC AND ANAEROBIC Blood Culture results may not be optimal due to an excessive volume of blood received in culture bottles   Culture   Final    NO GROWTH 2 DAYS Performed at [facility], [place] [address], [place], [place] [address]    Report Status PENDING  Incomplete  Culture, blood (Routine X 2) w Reflex to ID Panel     Status: None (Preliminary result)   Collection Time: 05/06/19  5:20 AM   Specimen: BLOOD LEFT HAND  Result Value Ref Range Status   Specimen Description BLOOD LEFT HAND  Final   Special Requests   Final    BOTTLES DRAWN AEROBIC ONLY Blood Culture results may not be optimal due to an excessive volume of blood received in culture bottles   Culture   Final    NO GROWTH 2 DAYS Performed at [facility], [place] [address], [place],  [address]  Report Status PENDING  Incomplete     Labs: BNP (last 3 results) Recent Labs    05/06/19 0520  BNP 202.5*   Basic Metabolic Panel: Recent Labs  Lab 05/05/19 2130 05/06/19 0520 05/07/19 0316 05/08/19 0344 05/09/19 0300  NA 133* 136 141 138 137  K 4.2 4.8 4.1 3.7 3.9  CL 102 103 110 109 107  CO2 23 24 21* 25 24  GLUCOSE 127* 143* 86 119* 233*  BUN '23 20 21 ' 26* 30*  CREATININE 1.69* 1.63* 1.82* 2.01* 2.16*  CALCIUM 8.8* 8.8* 8.9 8.5* 8.3*  MG 1.9  --   --   --   --    Liver Function Tests: Recent Labs  Lab 05/05/19 2130 05/07/19 0316 05/08/19 0344 05/09/19 0300  AST 33 '30 22 24  ' ALT '28 22 20 21  ' ALKPHOS 77 59 49 57  BILITOT 1.1 1.2 1.1 0.5  PROT 6.9 6.3* 5.4* 5.4*  ALBUMIN 3.4* 3.2* 2.7* 2.6*   Recent Labs  Lab 05/05/19 2130   LIPASE 19   No results for input(s): AMMONIA in the last 168 hours. CBC: Recent Labs  Lab 05/05/19 2130 05/06/19 0520 05/07/19 0316 05/08/19 0344 05/09/19 0300  WBC 3.6* 4.7 4.7 5.2 3.2*  NEUTROABS 1.9  --   --   --   --   HGB 10.9* 10.5* 10.5* 8.8* 8.5*  HCT 34.8* 35.2* 34.8* 28.5* 28.1*  MCV 67.7* 67.7* 68.0* 66.7* 67.7*  PLT 202 210 184 166 148*   Cardiac Enzymes: No results for input(s): CKTOTAL, CKMB, CKMBINDEX, TROPONINI in the last 168 hours. BNP: Invalid input(s): POCBNP CBG: Recent Labs  Lab 05/08/19 0754 05/08/19 1157 05/08/19 1712 05/08/19 2202 05/09/19 0751  GLUCAP 108* 111* 119* 211* 199*   D-Dimer No results for input(s): DDIMER in the last 72 hours. Hgb A1c No results for input(s): HGBA1C in the last 72 hours. Lipid Profile No results for input(s): CHOL, HDL, LDLCALC, TRIG, CHOLHDL, LDLDIRECT in the last 72 hours. Thyroid function studies No results for input(s): TSH, T4TOTAL, T3FREE, THYROIDAB in the last 72 hours.  Invalid input(s): FREET3 Anemia work up No results for input(s): VITAMINB12, FOLATE, FERRITIN, TIBC, IRON, RETICCTPCT in the last 72 hours. Urinalysis    Component Value Date/Time   COLORURINE YELLOW 09/03/2015 1505   APPEARANCEUR CLEAR 09/03/2015 1505   LABSPEC 1.035 (H) 09/03/2015 1505   PHURINE 6.0 09/03/2015 1505   GLUCOSEU >1000 (A) 09/03/2015 1505   HGBUR NEGATIVE 09/03/2015 1505   HGBUR negative 08/19/2008 1530   BILIRUBINUR NEGATIVE 09/03/2015 1505   KETONESUR NEGATIVE 09/03/2015 1505   PROTEINUR NEGATIVE 09/03/2015 1505   UROBILINOGEN 0.2 04/08/2015 0456   NITRITE NEGATIVE 09/03/2015 1505   LEUKOCYTESUR NEGATIVE 09/03/2015 1505   Sepsis Labs Invalid input(s): PROCALCITONIN,  WBC,  LACTICIDVEN Microbiology Recent Results (from the past 240 hour(s))  WOUND CULTURE     Status: Abnormal   Collection Time: 04/30/19 11:57 AM   Specimen: Wound  Result Value Ref Range Status   MICRO NUMBER: [id number]  Final   SPECIMEN  QUALITY: Adequate  Final   SOURCE: WOUND (SITE NOT SPECIFIED)  Final   STATUS: ADDENDUM - FINAL  Corrected   GRAM STAIN: Gram positive cocci in chains  Final    Comment: No white blood cells seen No epithelial cells seen Moderate Gram positive cocci in chains Few Gram positive bacilli   ISOLATE 1: Streptococcus agalactiae (A)  Corrected    Comment: Heavy growth of Group B Streptococcus isolated  Susceptibility   Streptococcus agalactiae - AEROBIC CULT, GRAM STAIN POSITIVE 1    AMPICILLIN <=0.25 Sensitive     CEFOTAXIME <=0.12 Sensitive     CEFTRIAXONE <=0.12 Sensitive     VANCOMYCIN 0.5 Sensitive     CLINDAMYCIN <=0.25 Sensitive     LEVOFLOXACIN 1 Sensitive     PENO - penicillin* 0.12 Sensitive      * Legend:S = Susceptible  I = IntermediateR = Resistant  NS = Not susceptible* = Not tested  NR = Not reported**NN = See antimicrobic comments  SARS CORONAVIRUS 2 (TAT 6-24 HRS) Nasopharyngeal Nasopharyngeal Swab     Status: None   Collection Time: 05/05/19 10:47 PM   Specimen: Nasopharyngeal Swab  Result Value Ref Range Status   SARS Coronavirus 2 NEGATIVE NEGATIVE Final    Comment: (NOTE) SARS-CoV-2 target nucleic acids are NOT DETECTED. The SARS-CoV-2 RNA is generally detectable in upper and lower respiratory specimens during the acute phase of infection. Negative results do not preclude SARS-CoV-2 infection, do not rule out co-infections with other pathogens, and should not be used as the sole basis for treatment or other patient management decisions. Negative results must be combined with clinical observations, patient history, and epidemiological information. The expected result is Negative. Fact Sheet for Patients: SugarRoll.be Fact Sheet for Healthcare Providers: https://www.woods-mathews.com/ This test is not yet approved or cleared by the [place] FDA and  has been authorized for detection and/or diagnosis of SARS-CoV-2  by FDA under an Emergency Use Authorization (EUA). This EUA will remain  in effect (meaning this test can be used) for the duration of the COVID-19 declaration under Section 56 4(b)(1) of the Act, 21 U.S.C. section 360bbb-3(b)(1), unless the authorization is terminated or revoked sooner. Performed at [facility], [place] [address], [place], [place] [address]   Culture, blood (Routine X 2) w Reflex to ID Panel     Status: None (Preliminary result)   Collection Time: 05/06/19  5:00 AM   Specimen: BLOOD LEFT ARM  Result Value Ref Range Status   Specimen Description BLOOD LEFT ARM  Final   Special Requests   Final    BOTTLES DRAWN AEROBIC AND ANAEROBIC Blood Culture results may not be optimal due to an excessive volume of blood received in culture bottles   Culture   Final    NO GROWTH 2 DAYS Performed at [facility], [place] [address] [place], [place] [address]    Report Status PENDING  Incomplete  Culture, blood (Routine X 2) w Reflex to ID Panel     Status: None (Preliminary result)   Collection Time: 05/06/19  5:20 AM   Specimen: BLOOD LEFT HAND  Result Value Ref Range Status   Specimen Description BLOOD LEFT HAND  Final   Special Requests   Final    BOTTLES DRAWN AEROBIC ONLY Blood Culture results may not be optimal due to an excessive volume of blood received in culture bottles   Culture   Final    NO GROWTH 2 DAYS Performed at [facility], [place] [address], [place],  [address]    Report Status PENDING  Incomplete     Time coordinating discharge: 35 minutes  SIGNED:   Aline August, MD  [facility] 05/09/2019, 10:49 AM

## 2019-05-09 NOTE — TOC Initial Note (Signed)
Transition of Care [facility]) - Initial/Assessment Note    Patient Details  Name: [name] MRN: [id number] Date of Birth: 06/10/1951  [facility] of [facility]) CM/SW Contact:    Benard Halsted, LCSW Phone Number: 05/09/2019, 11:51 AM  Clinical Narrative:                 CSW received consult for possible home health services at time of discharge. CSW spoke with patient regarding PT recommendation of Home Health PT at time of discharge. Patient reported that he would like home health services. Patient reports preference for [facility] since he has used them before and liked their care. [facility] has accepted patient. CSW discussed equipment needs with patient and he requested a rolling walker. CSW reached out to [employer] for delivery to the room prior to discharge. CSW confirmed PCP and address with patient. Patient states his wife will come pick him up at discharge. No further questions reported at this time.      Expected Discharge Plan: [place] Barriers to Discharge: No Barriers Identified   Patient Goals and CMS Choice Patient states their goals for this hospitalization and ongoing recovery are:: Return to work [employer].gov Compare Post Acute Care list provided to:: Patient Choice offered to / list presented to : Patient  Expected Discharge Plan and Services Expected Discharge Plan: [place]   Discharge Planning Services: CM Consult Post Acute Care Choice: Home Health, Durable Medical Equipment Living arrangements for the past 2 months: Apartment Expected Discharge Date: 05/09/19               DME Arranged: [name] rolling DME Agency: [employer] Date DME Agency Contacted: 05/09/19 Time DME Agency Contacted: [phone] Representative spoke with at DME Agency: [place]: PT [place]: [place] Date [place]: 05/09/19 Time [place]: 1191 Representative spoke with at [place]:  [place] Arrangements/Services Living arrangements for the past 2 months: [place] with:: Self Patient language and need for interpreter reviewed:: Yes        Need for Family Participation in Patient Care: No (Comment) Care giver support system in place?: No (comment)   Criminal Activity/Legal Involvement Pertinent to Current Situation/Hospitalization: No - Comment as needed  Activities of Daily Living Home Assistive Devices/Equipment: Grab bars in shower, Scales, Walker (specify type)(Four wheel walker) ADL Screening (condition at time of admission) Patient's cognitive ability adequate to safely complete daily activities?: Yes Is the patient deaf or have difficulty hearing?: No Does the patient have difficulty seeing, even when wearing glasses/contacts?: No Does the patient have difficulty concentrating, remembering, or making decisions?: No Patient able to express need for assistance with ADLs?: Yes Does the patient have difficulty dressing or bathing?: Yes Independently performs ADLs?: No Communication: Independent Dressing (OT): Needs assistance Is this a change from baseline?: Change from baseline, expected to last <3days Grooming: Independent Feeding: Independent Bathing: Needs assistance Is this a change from baseline?: Change from baseline, expected to last <3 days Toileting: Independent In/Out Bed: Needs assistance Is this a change from baseline?: Change from baseline, expected to last <3 days Walks in Home: Needs assistance Is this a change from baseline?: Change from baseline, expected to last <3 days Does the patient have difficulty walking or climbing stairs?: Yes Weakness of Legs: Both Weakness of Arms/Hands: None  Permission Sought/Granted Permission sought to share information with : Facility Sport and exercise psychologist Permission granted to share information with :  Yes, Verbal Permission Granted     Permission granted to share info w AGENCY:  [place]        Emotional Assessment Appearance:: Appears stated age Attitude/Demeanor/Rapport: Engaged, Charismatic, Gracious Affect (typically observed): Accepting, Pleasant, Appropriate Orientation: : Oriented to Self, Oriented to Place, Oriented to  Time, Oriented to Situation Alcohol / Substance Use: Not Applicable Psych Involvement: No (comment)  Admission diagnosis:  Abdominal pain, generalized [R10.84] Complete heart block ([place]) [I44.2] Small bowel obstruction ([place]) [K56.609] Encounter for nasogastric (NG) tube placement [Z46.59] Encounter for imaging study to confirm nasogastric (NG) tube placement [Z01.89] Vomiting in adult [R11.10] Patient Active Problem List   Diagnosis Date Noted  . Ulcer of left foot due to type 2 diabetes mellitus ([place])   . Skin ulcer of left foot, limited to breakdown of skin ([place])   . Charcot foot due to diabetes mellitus ([place])   . Charcot's joint of foot, left   . SBO (small bowel obstruction) ([place]) 05/06/2019  . Complete heart block ([place]) 05/06/2019  . Moderate protein-calorie malnutrition ([place])   . Diabetic foot ulcer ([place]) 10/19/2018  . Diabetic foot infection ([place])   . Pseudophakia, both eyes 10/04/2018  . Stage 3 chronic kidney disease ([place]) 09/27/2018  . Adrenal insufficiency ([place]) 04/09/2015  . Peripheral edema 09/17/2014  . Shortness of breath 09/17/2014  . Bronchospasm, acute 09/16/2014  . Partial small bowel obstruction ([place]) 09/16/2014  . Hyperglycemia   . Diabetic retinopathy associated with type 2 diabetes mellitus ([place]) 09/13/2014  . Abdominal pain 09/13/2014  . Cough 11/15/2012  . Pharyngitis 09/14/2012  . Fluid overload 09/03/2012  . Ileus following gastrointestinal surgery ([place]) 09/03/2012  . Hypokalemia 08/30/2012  . Small bowel obstruction s/p LOA [id number] 08/21/2012  . Abnormal EKG 08/21/2012  . Chest pain 08/21/2012  . Macular degeneration (senile) of retina 12/15/2011  . Lens replaced 12/15/2011  . Vitreous  hemorrhage ([place]) 12/15/2011  . Essential hypertension 08/19/2008  . Regional enteritis of small intestine ([place]) 01/03/2008  . ERECTILE DYSFUNCTION, ORGANIC 01/03/2008  . Sarcoidosis 07/19/2007  . Diabetes mellitus ([place]) 07/19/2007  . HYPERLIPIDEMIA, MIXED 07/19/2007  . CERUMEN IMPACTION, BILATERAL 07/19/2007  . URINARY INCONTINENCE, STRESS, MALE 08/17/2006  . PROSTATE CANCER, HX OF 08/17/2006  . DIABETIC  RETINOPATHY 01/09/2002   PCP:  [name], MD Pharmacy:   [facility] [place], [place] AT [address] [place] [address] Phone: [phone] Fax: [phone]     Social Determinants of Health (SDOH) Interventions    Readmission Risk Interventions Readmission Risk Prevention Plan 05/09/2019  Transportation Screening Complete  PCP or Specialist Appt within 3-5 Days Complete  HRI or [place] Complete  Social Work Consult for [place] Planning/Counseling Complete  Palliative Care Screening Not Applicable  Medication Review Press photographer) Complete  Some recent data might be hidden

## 2019-05-10 ENCOUNTER — Other Ambulatory Visit: Payer: Self-pay

## 2019-05-10 ENCOUNTER — Encounter: Payer: Self-pay | Admitting: Podiatry

## 2019-05-10 ENCOUNTER — Ambulatory Visit (INDEPENDENT_AMBULATORY_CARE_PROVIDER_SITE_OTHER): Payer: Medicare Other | Admitting: Podiatry

## 2019-05-10 DIAGNOSIS — E1142 Type 2 diabetes mellitus with diabetic polyneuropathy: Secondary | ICD-10-CM

## 2019-05-10 DIAGNOSIS — E11621 Type 2 diabetes mellitus with foot ulcer: Secondary | ICD-10-CM

## 2019-05-10 DIAGNOSIS — L97422 Non-pressure chronic ulcer of left heel and midfoot with fat layer exposed: Secondary | ICD-10-CM

## 2019-05-10 DIAGNOSIS — E11610 Type 2 diabetes mellitus with diabetic neuropathic arthropathy: Secondary | ICD-10-CM | POA: Diagnosis not present

## 2019-05-10 MED ORDER — MUPIROCIN 2 % EX OINT: TOPICAL_OINTMENT | CUTANEOUS | 0 refills | Status: DC

## 2019-05-10 MED ORDER — CLINDAMYCIN HCL 300 MG PO CAPS: 300.0000 mg | ORAL_CAPSULE | Freq: Two times a day (BID) | ORAL | 0 refills | Status: DC

## 2019-05-10 NOTE — Patient Instructions (Signed)
STOP TAKING DOXYCYCLINE    DRESSING CHANGES RIGHT FOOT:  WEAR SURGICAL SHOE AT ALL TIMES    1. KEEP RIGHT  FOOT DRY AT ALL TIMES!!!!  2. CLEANSE ULCER WITH SALINE.  3. DAB DRY WITH GAUZE SPONGE.  4. APPLY A LIGHT AMOUNT OF MUPIROCIN  TO BASE OF ULCER.  5. APPLY OUTER DRESSING/BAND-AID AS INSTRUCTED.  6. WEAR SURGICAL SHOE DAILY AT ALL TIMES.  7. DO NOT WALK BAREFOOT!!!  8.  IF YOU EXPERIENCE ANY FEVER, CHILLS, NIGHTSWEATS, NAUSEA OR VOMITING, ELEVATED OR LOW BLOOD SUGARS, REPORT TO EMERGENCY ROOM.  9. IF YOU EXPERIENCE INCREASED REDNESS, PAIN, SWELLING, DISCOLORATION, ODOR, PUS, DRAINAGE OR WARMTH OF YOUR FOOT, REPORT TO EMERGENCY ROOM.

## 2019-05-10 NOTE — Progress Notes (Signed)
[name]: / [name] presents for continued care of ulceration plantarlateral aspect of left foot.  Prior to hospital admission, he had been performing daily dressing changes to left foot daily utilizing Iodosorb Gel. [name] was discharged from the hospital yesterday for small bowel obstruction. Pt. denies any new complaints.  Patient denies any fever, chills, nightsweats, nausea or vomiting.  His PCP is Dr. [name]. Last visit was 04/26/2019. He has follow up visit 05/17/2019. He has follow up appointment with Cardiology.   Patient states he is now staying with a supportive friend. He is anxious to return back to work. He has also postponed a trip to [facility] to celebrate his birthday/visit his daughter.  Past Medical History:  Diagnosis Date  . Cellulitis and abscess of face 10/11/2007   Qualifier: Diagnosis of  By: Amil Amen MD, Benjamine Mola    . Diabetic retinopathy ([place])   . Diabetic retinopathy associated with type 2 diabetes mellitus ([place]) 09/13/2014   He works for [employer]  . Fluid overload 09/03/2012   Post op  . GERD (gastroesophageal reflux disease)   . Visual impairment    Patient Active Problem List   Diagnosis Date Noted  . Ulcer of left foot due to type 2 diabetes mellitus ([place])   . Skin ulcer of left foot, limited to breakdown of skin ([place])   . Charcot foot due to diabetes mellitus ([place])   . Charcot's joint of foot, left   . SBO (small bowel obstruction) ([place]) 05/06/2019  . Complete heart block ([place]) 05/06/2019  . Moderate protein-calorie malnutrition ([place])   . Diabetic foot ulcer ([place]) 10/19/2018  . Diabetic foot infection ([place])   . Pseudophakia, both eyes 10/04/2018  . Stage 3 chronic kidney disease ([place]) 09/27/2018  . Adrenal insufficiency ([place]) 04/09/2015  . Peripheral edema 09/17/2014  . Shortness of breath 09/17/2014  . Bronchospasm, acute 09/16/2014  . Partial small bowel obstruction ([place]) 09/16/2014  . Hyperglycemia   .  Diabetic retinopathy associated with type 2 diabetes mellitus ([place]) 09/13/2014  . Abdominal pain 09/13/2014  . Cough 11/15/2012  . Pharyngitis 09/14/2012  . Fluid overload 09/03/2012  . Ileus following gastrointestinal surgery ([place]) 09/03/2012  . Hypokalemia 08/30/2012  . Small bowel obstruction s/p LOA [id number] 08/21/2012  . Abnormal EKG 08/21/2012  . Chest pain 08/21/2012  . Macular degeneration (senile) of retina 12/15/2011  . Lens replaced 12/15/2011  . Vitreous hemorrhage ([place]) 12/15/2011  . Essential hypertension 08/19/2008  . Regional enteritis of small intestine ([place]) 01/03/2008  . ERECTILE DYSFUNCTION, ORGANIC 01/03/2008  . Sarcoidosis 07/19/2007  . Diabetes mellitus ([place]) 07/19/2007  . HYPERLIPIDEMIA, MIXED 07/19/2007  . CERUMEN IMPACTION, BILATERAL 07/19/2007  . URINARY INCONTINENCE, STRESS, MALE 08/17/2006  . PROSTATE CANCER, HX OF 08/17/2006  . DIABETIC  RETINOPATHY 01/09/2002   Past Surgical History:  Procedure Laterality Date  . CATARACT EXTRACTION W/ INTRAOCULAR LENS  IMPLANT, BILATERAL    . EYE SURGERY Bilateral    "laser OR for diabetic retinopathy"  . INGUINAL HERNIA REPAIR     [name] 07/12/2010), "don't remember which side"  . LAPAROTOMY  08/23/2012   Procedure: EXPLORATORY LAPAROTOMY;  Surgeon: [name], DO;  Location: [facility];  Service: General;  Laterality: N/A;  exploratory laparotomy with lysis of adhesions  . LYSIS OF ADHESION  08/23/2012   Procedure: LYSIS OF ADHESION;  Surgeon: [name], DO;  Location: [facility];  Service: General;;  . ROBOT ASSISTED LAPAROSCOPIC RADICAL PROSTATECTOMY  2000's   "had to  finish manually after machine broke"  . SHOULDER SURGERY  1970's   separation; from playing football"   Current Outpatient Medications on File Prior to Visit  Medication Sig Dispense Refill  . acetaminophen (TYLENOL) 650 MG CR tablet Take 650 mg by mouth every 8 (eight) hours as needed for pain.    [name] albuterol (VENTOLIN HFA) 108 (90 Base) MCG/ACT  inhaler Inhale 2 puffs into the lungs every 4 (four) hours as needed for wheezing or shortness of breath. 6.7 g 0  . amLODipine (NORVASC) 5 MG tablet Take 1 tablet (5 mg total) by mouth daily. 30 tablet 0  . aspirin 81 MG chewable tablet Chew 81 mg by mouth every morning.     [name] atorvastatin (LIPITOR) 20 MG tablet Take 20 mg by mouth every morning.     . blood glucose meter kit and supplies KIT Dispense based on patient and insurance preference. Use up to four times daily as directed. (FOR ICD-9 250.00, 250.01). 1 each 1  . doxycycline (VIBRA-TABS) 100 MG tablet Take 1 tablet (100 mg total) by mouth 2 (two) times daily for 14 days. 28 tablet 0  . [START ON 05/13/2019] furosemide (LASIX) 40 MG tablet Take 1 tablet (40 mg total) by mouth daily. 30 tablet 0  . gabapentin (NEURONTIN) 300 MG capsule TAKE 1 CAPSULE(300 MG) BY MOUTH THREE TIMES DAILY (Patient taking differently: Take 300 mg by mouth 3 (three) times daily. ) 90 capsule 0  . Insulin Isophane & Regular Human (HUMULIN 70/30 MIX) (70-30) 100 UNIT/ML PEN Inject 20 Units into the skin daily before breakfast AND 16-26 Units daily before supper.    . Insulin Pen Needle (PEN NEEDLES 31GX5/16") 31G X 8 MM MISC Use new needle with each insulin injection, twice a day. Dx E11.65, Z79.4 100 each 5  . Lancets (FREESTYLE) lancets Use as instructed 100 each 0  . Lancets 30G MISC     . Multiple Vitamin (MULTIVITAMIN WITH MINERALS) TABS tablet Take 1 tablet by mouth every morning. Centrum    . ondansetron (ZOFRAN) 4 MG tablet Take 1 tablet (4 mg total) by mouth daily as needed for nausea or vomiting. 20 tablet 0  . Polyethyl Glycol-Propyl Glycol (SYSTANE OP) Place 1 drop into both eyes daily as needed (dry eyes).    . polyethylene glycol (MIRALAX / GLYCOLAX) 17 g packet Take 17 g by mouth daily as needed for moderate constipation. 14 each 0  . senna (SENOKOT) 8.6 MG TABS tablet Take 1 tablet (8.6 mg total) by mouth 2 (two) times daily. 30 tablet 0  . traMADol  (ULTRAM) 50 MG tablet Take 1 tablet (50 mg total) by mouth every 6 (six) hours as needed for moderate pain (pain). 20 tablet 0   No current facility-administered medications on file prior to visit.      Allergies  Allergen Reactions  . Ace Inhibitors Itching and Cough  . Naproxen Anaphylaxis, Shortness Of Breath and Other (See Comments)    Throat swells. cannot breathe, and causes GI distress    Social History   Occupational History  . Not on file  Tobacco Use  . Smoking status: Former Smoker    Packs/day: 2.50    Years: 3.00    Pack years: 7.50    Types: Cigarettes    Quit date: 11/09/1966    Years since quitting: 52.5  . Smokeless tobacco: Never Used  Substance and Sexual Activity  . Alcohol use: Yes    Comment: 04/08/2015 "maybe a beer/ or  2 or a glass of wine monthly"  . Drug use: No  . Sexual activity: Yes   Family History  Problem Relation Age of Onset  . Diabetes Mellitus II Mother   . Colon cancer Neg Hx    Immunization History  Administered Date(s) Administered  . H1N1 08/19/2008  . Influenza Split 08/22/2012  . Influenza Whole 07/19/2007, 08/19/2008  . Influenza,inj,Quad PF,6+ Mos 05/19/2016  . Influenza,inj,quad, With Preservative 05/31/2013  . Influenza-Unspecified 08/19/2008, 08/22/2012, 05/31/2013, 05/19/2016  . Pneumococcal Conjugate-13 11/05/2013, 11/05/2013  . Pneumococcal Polysaccharide-23 07/19/2007, 03/08/2016, 03/08/2016  . Td 08/15/2001  . Tdap 08/15/2001  . Tetanus 08/15/2001   Review of systems: Positive Findings in bold print.  Constitutional:  chills, fatigue, fever, sweats, weight change Communication: Optometrist, sign Ecologist, hand writing, iPad/Android device Head: headaches, head injury Eyes: changes in vision, eye pain, glaucoma, cataracts, macular degeneration, diplopia, glare,  light sensitivity, eyeglasses or contacts, blindness Ears nose mouth throat: hearing impaired, hearing aids,  ringing in ears, deaf, sign  language,  vertigo,   nosebleeds,  rhinitis,  cold sores, snoring, swollen glands Cardiovascular: HTN, edema, arrhythmia, pacemaker in place, defibrillator in place, chest pain/tightness, chronic anticoagulation, blood clot, heart failure, MI Peripheral Vascular: leg cramps, varicose veins, blood clots, lymphedema, varicosities Respiratory:  difficulty breathing, denies congestion, SOB, wheezing, cough, emphysema Gastrointestinal: change in appetite or weight, abdominal pain, constipation, diarrhea, nausea, vomiting, vomiting blood, change in bowel habits, abdominal pain, jaundice, rectal bleeding, hemorrhoids, GERD Genitourinary:  nocturia,  pain on urination, polyuria,  blood in urine, Foley catheter, urinary urgency, ESRD on hemodialysis Musculoskeletal: amputation, cramping, stiff joints, painful joints, decreased joint motion, fractures, OA, gout, hemiplegia, paraplegia, uses cane, wheelchair bound, uses walker, uses rollator Skin: +changes in toenails, color change, dryness, itching, mole changes,  rash, left foot wound Neurological: headaches, numbness in feet, paresthesias in feet, burning in feet, fainting,  seizures, change in speech. denies headaches, memory problems/poor historian, cerebral palsy, weakness, paralysis, CVA, TIA Endocrine: diabetes, hypothyroidism, hyperthyroidism,  goiter, dry mouth, flushing, heat intolerance,  cold intolerance,  excessive thirst, denies polyuria,  nocturia Hematological:  easy bleeding, excessive bleeding, easy bruising, enlarged lymph nodes, on long term blood thinner, history of past transusions Allergy/immunological:  hives, eczema, frequent infections, multiple drug allergies, seasonal allergies, transplant recipient, multiple food allergies Psychiatric:  anxiety, depression, mood disorder, suicidal ideations, hallucinations, insomnia  Objective:   Vascular Examination:  Capillary refill time immediate x 10 digits.  Dorsalis pedis pulses faintly  palpable b/l.  Posterior tibial pulses nonpalpable b/l.  Digital hair absent b/l.   Skin temperature gradient WNL b/l.  Edema decreased left foot/ankle. Dermatological Examination: Skin thin, shiny and atrophic b/l  Toenails 1-5 b/l, mycotic, recently debrided.  Ulceration located plantarlateral aspect left foot: Predebridement measurements carried out today of 4.5 x 5.5 cm with hyperkeratotic roof.  Macerated tissue noted on previous visit has resolved. No periulcerative erythema, no edema, no drainage.  Flocculence absent.  Malodor absent. No warmth.  Postdebridement measurements today are: 4.5 x 5.5 cm, now partial thickness.  No tracking, no tunneling, no undermining. No probing to bone, no purulent drainage.  No deep abscess evident.  Musculoskeletal: Muscle strength 5/5 to all LE muscle groups bilaterally.  Charcot foot deformity left foot.  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  Neurological: Protective sensation diminished bilaterally with 10 gram monofilament.  Labs: CBC Latest Ref Rng & Units 05/09/2019 05/08/2019 05/07/2019  WBC 4.0 - 10.5 K/uL 3.2(L) 5.2 4.7  Hemoglobin 13.0 - 17.0 g/dL 8.5(L) 8.8(L) 10.5(L)  Hematocrit 39.0 - 52.0 % 28.1(L) 28.5(L) 34.8(L)  Platelets 150 - 400 K/uL 148(L) 166 184     Chemistry      Component Value Date/Time   NA 137 05/09/2019 0300   NA 141 04/19/2019 1013   K 3.9 05/09/2019 0300   CL 107 05/09/2019 0300   CO2 24 05/09/2019 0300   BUN 30 (H) 05/09/2019 0300   BUN 31 (H) 04/19/2019 1013   CREATININE 2.16 (H) 05/09/2019 0300      Component Value Date/Time   CALCIUM 8.3 (L) 05/09/2019 0300   ALKPHOS 57 05/09/2019 0300   AST 24 05/09/2019 0300   ALT 21 05/09/2019 0300   BILITOT 0.5 05/09/2019 0300   BILITOT 0.5 04/19/2019 1013     Status:  Edited Result - FINAL  Visible to patient:  Yes (MyChart)  Next appt:  05/13/2019 at 01:15 PM in Internal Medicine Philemon Kingdom, MD)  Dx:  Diabetic ulcer of left midfoot  associ... Specimen Information: Wound     Component 10d ago  MICRO NUMBER: [id number]   SPECIMEN QUALITY: Adequate   SOURCE: WOUND (SITE NOT SPECIFIED)   STATUS: ADDENDUM - FINAL VC   GRAM STAIN: Gram positive cocci in chains   Comment: No white blood cells seen No epithelial cells seen Moderate Gram positive cocci in chains Few Gram positive bacilli  ISOLATE 1: Streptococcus agalactiaeAbnormal  VC   Comment: Heavy growth of Group B Streptococcus isolated  Resulting Agency [facility]  Susceptibility   Streptococcus agalactiae    AEROBIC CULT, GRAM STAIN POSITIVE 1 (Corrected)    AMPICILLIN <=0.25  Sensitive    CEFOTAXIME <=0.12  Sensitive    CEFTRIAXONE <=0.12  Sensitive    CLINDAMYCIN <=0.25  Sensitive    LEVOFLOXACIN 1  Sensitive    PENO - penicillin 0.12  Sensitive1    VANCOMYCIN 0.5  Sensitive         1 Legend:  S = Susceptible I = Intermediate  R = Resistant NS = Not susceptible  * = Not tested NR = Not reported  **NN = See antimicrobic comments        Specimen Collected: 04/30/19 11:57 Last Resulted: 05/05/19 08:21        Assessment:   1.  Diabetic Ulceration left plantarlateral midfoot, noninfected, improved 2. Charcot Neuroarthropathy left foot 3. NIDDM with peripheral neuropathy  Plan: 1. Discussed diabetic ulcer and Charcot Neuroarthropathy with patient. Wound is much improved today. In agreement with him to postpone any travel due to recent hospitalization and foot wound. He related understanding.  2. Work disposition: We cannot entertain him returning to work until Charcot of his left foot is quiescent. If foot continues to improve, we may place him in a total contact cast. 3. Reviewed wound culture results with [name]. We will discontinue doxycycline and start Clindamycin per C&S results of wound. Rx written for Clindamycin 300 mg po bid x 7 days.  4. Ulcer was debrided to the level of healthy, viable tissue. Ulcer was cleansed with wound cleanser. Iodosorb  Gel was applied to base of wound with light dressing. 5. New wound care instructions. Continue to keep left foot dry. Cleanse wound with saline. Dab dry with gauze. New prescription written for Mupirocin Ointment. Apply Mupirocin Ointment to wound once daily.  6. Continue Surgical shoe with PegAssist left foot. 7. Patient was given instructions on offloading and dressing change/aftercare and was instructed to call immediately if any signs or symptoms of infection arise.  8. Patient is  to follow up one week. All questions/concerns addressed with patient on today's visit.  9. Patient instructed to report to emergency department with worsening appearance of ulcer/toe/foot, increased pain, foul odor, increased redness, swelling, drainage, fever, chills, nightsweats, nausea, vomiting, increased blood sugar.  10. His diabetic insert may have to be modified/reordered to accommodate his Charcot deformity. 11. Follow up one week. He will also see Dr. [name] on 05/21/2019.

## 2019-05-11 LAB — CULTURE, BLOOD (ROUTINE X 2)
Culture: NO GROWTH
Culture: NO GROWTH

## 2019-05-13 ENCOUNTER — Telehealth: Payer: Self-pay | Admitting: *Deleted

## 2019-05-13 ENCOUNTER — Encounter: Payer: Self-pay | Admitting: Internal Medicine

## 2019-05-13 ENCOUNTER — Other Ambulatory Visit: Payer: Self-pay

## 2019-05-13 ENCOUNTER — Ambulatory Visit (INDEPENDENT_AMBULATORY_CARE_PROVIDER_SITE_OTHER): Payer: Medicare Other | Admitting: Internal Medicine

## 2019-05-13 VITALS — BP 170/90 | HR 72 | Ht 66.0 in | Wt 179.0 lb

## 2019-05-13 DIAGNOSIS — E1165 Type 2 diabetes mellitus with hyperglycemia: Secondary | ICD-10-CM | POA: Diagnosis not present

## 2019-05-13 DIAGNOSIS — L97929 Non-pressure chronic ulcer of unspecified part of left lower leg with unspecified severity: Secondary | ICD-10-CM

## 2019-05-13 DIAGNOSIS — E11319 Type 2 diabetes mellitus with unspecified diabetic retinopathy without macular edema: Secondary | ICD-10-CM | POA: Diagnosis not present

## 2019-05-13 DIAGNOSIS — IMO0002 Reserved for concepts with insufficient information to code with codable children: Secondary | ICD-10-CM

## 2019-05-13 MED ORDER — INSULIN ISOPHANE & REGULAR (HUMAN 70-30)100 UNIT/ML KWIKPEN: PEN_INJECTOR | SUBCUTANEOUS | Status: DC

## 2019-05-13 NOTE — Patient Instructions (Addendum)
Please use: - 70/30 (ReliOn) insulin 16-20 units before b'fast and 16-20 units before dinner  Get a ReliOn glucometer from [employer].  Inject the insulin ~30 min before a meal.  Please let me know if the sugars are consistently <80 or >250.  Please return in 3 months with your sugar log.   PATIENT INSTRUCTIONS FOR TYPE 2 DIABETES:  **Please join MyChart!** - see attached instructions about how to join if you have not done so already.  DIET AND EXERCISE Diet and exercise is an important part of diabetic treatment.  We recommended aerobic exercise in the form of brisk walking (working between 40-60% of maximal aerobic capacity, similar to brisk walking) for 150 minutes per week (such as 30 minutes five days per week) along with 3 times per week performing 'resistance' training (using various gauge rubber tubes with handles) 5-10 exercises involving the major muscle groups (upper body, lower body and core) performing 10-15 repetitions (or near fatigue) each exercise. Start at half the above goal but build slowly to reach the above goals. If limited by weight, joint pain, or disability, we recommend daily walking in a swimming pool with water up to waist to reduce pressure from joints while allow for adequate exercise.    BLOOD GLUCOSES Monitoring your blood glucoses is important for continued management of your diabetes. Please check your blood glucoses 2-4 times a day: fasting, before meals and at bedtime (you can rotate these measurements - e.g. one day check before the 3 meals, the next day check before 2 of the meals and before bedtime, etc.).   HYPOGLYCEMIA (low blood sugar) Hypoglycemia is usually a reaction to not eating, exercising, or taking too much insulin/ other diabetes drugs.  Symptoms include tremors, sweating, hunger, confusion, headache, etc. Treat IMMEDIATELY with 15 grams of Carbs: . 4 glucose tablets .  cup regular juice/soda . 2 tablespoons raisins . 4 teaspoons  sugar . 1 tablespoon honey Recheck blood glucose in 15 mins and repeat above if still symptomatic/blood glucose <100.  RECOMMENDATIONS TO REDUCE YOUR RISK OF DIABETIC COMPLICATIONS: * Take your prescribed MEDICATION(S) * Follow a DIABETIC diet: Complex carbs, fiber rich foods, (monounsaturated and polyunsaturated) fats * AVOID saturated/trans fats, high fat foods, >2,300 mg salt per day. * EXERCISE at least 5 times a week for 30 minutes or preferably daily.  * DO NOT SMOKE OR DRINK more than 1 drink a day. * Check your FEET every day. Do not wear tightfitting shoes. Contact us if you develop an ulcer * See your EYE doctor once a year or more if needed * Get a FLU shot once a year * Get a PNEUMONIA vaccine once before and once after age 26 years  GOALS:  * Your Hemoglobin A1c of <7%  * fasting sugars need to be <130 * after meals sugars need to be <180 (2h after you start eating) * Your Systolic BP should be 482 or lower  * Your Diastolic BP should be 80 or lower  * Your HDL (Good Cholesterol) should be 40 or higher  * Your LDL (Bad Cholesterol) should be 100 or lower. * Your Triglycerides should be 150 or lower  * Your Urine microalbumin (kidney function) should be <30 * Your Body Mass Index should be 25 or lower    Please consider the following ways to cut down carbs and fat and increase fiber and micronutrients in your diet: - substitute whole grain for white bread or pasta - substitute brown rice for white  rice - substitute 90-calorie flat bread pieces for slices of bread when possible - substitute sweet potatoes or yams for white potatoes - substitute humus for margarine - substitute tofu for cheese when possible - substitute almond or rice milk for regular milk (would not drink soy milk daily due to concern for soy estrogen influence on breast cancer risk) - substitute dark chocolate for other sweets when possible - substitute water - can add lemon or orange slices for taste  - for diet sodas (artificial sweeteners will trick your body that you can eat sweets without getting calories and will lead you to overeating and weight gain in the long run) - do not skip breakfast or other meals (this will slow down the metabolism and will result in more weight gain over time)  - can try smoothies made from fruit and almond/rice milk in am instead of regular breakfast - can also try old-fashioned (not instant) oatmeal made with almond/rice milk in am - order the dressing on the side when eating salad at a restaurant (pour less than half of the dressing on the salad) - eat as little meat as possible - can try juicing, but should not forget that juicing will get rid of the fiber, so would alternate with eating raw veg./fruits or drinking smoothies - use as little oil as possible, even when using olive oil - can dress a salad with a mix of balsamic vinegar and lemon juice, for e.g. - use agave nectar, stevia sugar, or regular sugar rather than artificial sweateners - steam or broil/roast veggies  - snack on veggies/fruit/nuts (unsalted, preferably) when possible, rather than processed foods - reduce or eliminate aspartame in diet (it is in diet sodas, chewing gum, etc) Read the labels!  Try to read Dr. Janene Harvey book: "Program for Reversing Diabetes" for other ideas for healthy eating.

## 2019-05-13 NOTE — Progress Notes (Signed)
Patient ID: [name], male   DOB: [birth year], 68 y.o.   MRN: [id number]   HPI: [name] is a 68 y.o.-year-old male, referred by his PCP, Dr. Pamella Pert, for management of DM2, dx in 1990s, insulin-dependent since 2000, very poorly controlled, with many complications (CKD, diabetic retinopathy, peripheral neuropathy-Charcot foot, diabetic ulcer, erectile dysfunction).  Reviewed his HbA1c levels and they have been above 10% the last 11 years: Lab Results  Component Value Date   HGBA1C 10.3 (H) 05/06/2019   HGBA1C 11.7 (A) 04/19/2019   HGBA1C 12.0 (A) 02/07/2019   HGBA1C 13.9 (H) 10/19/2018   HGBA1C 11.9 (H) 07/18/2018   HGBA1C 16.0 (H) 09/13/2014   HGBA1C 16.3 (H) 08/22/2012   HGBA1C (H) 07/12/2010    16.4 (NOTE)                                                                       According to the ADA Clinical Practice Recommendations for 2011, when HbA1c is used as a screening test:   >=6.5%   Diagnostic of Diabetes Mellitus           (if abnormal result  is confirmed)  5.7-6.4%   Increased risk of developing Diabetes Mellitus  References:Diagnosis and Classification of Diabetes Mellitus,Diabetes ZWCH,8527,78(EUMPN 1):S62-S69 and Standards of Medical Care in         Diabetes - 2011,Diabetes Care,2011,34  (Suppl 1):S11-S61.   HGBA1C 10.5 08/19/2008   HGBA1C 12.7 01/03/2008   Pt is on a regimen of: - Humulin 70/30 (pens) 20 units before or after breakfast  and 20 units before or after dinner  He is not working >> very limited resources.  He is stretching his insulin and does not take it daily.  Pt does not check his sugars as his current children keep misplacing his glucometers. - am: n/c - 2h after b'fast: n/c - before lunch: n/c - 2h after lunch: n/c - before dinner: n/c - 2h after dinner: n/c - bedtime: n/c - nighttime: n/c Lowest sugar was 70; he has hypoglycemia awareness at 70.  Highest sugar was ?.  Glucometer: no  Pt's meals are: - Breakfast: bran cereal +  almond milk - vanilla - Lunch:  fruit - Dinner: fish, veggies and beans - Snacks: no snacks except popcorn Diet drinks.  - + CKD, last BUN/creatinine:  Lab Results  Component Value Date   BUN 30 (H) 05/09/2019   BUN 26 (H) 05/08/2019   CREATININE 2.16 (H) 05/09/2019   CREATININE 2.01 (H) 05/08/2019  On losartan 25.  -+ HL; last set of lipids: Lab Results  Component Value Date   CHOL 136 05/06/2019   HDL 53 05/06/2019   LDLCALC 74 05/06/2019   TRIG 47 05/06/2019   CHOLHDL 2.6 05/06/2019  On Lipitor 20.   - last eye exam was in 2019. + DR.   - No numbness and tingling in his feet.  He does have a history of peripheral neuropathy with left Charcot foot.  He is on Neurontin 300 mg 3 times a day.  He sees Dr. [name] and Dr. Adah Perl.  Pt has FH of DM in sisters.  He also has a history of HTN, sarcoidosis, complete heart block, SBO, adrenal insufficiency -however, this is  resolved, currently not on steroids.  Recent cortisol level (9 AM) reviewed >> normal: Component     Latest Ref Rng & Units 05/07/2019  Cortisol, Plasma     ug/dL 17.7    ROS: Constitutional: + weight gain, no weight loss, no fatigue, no subjective hyperthermia, no subjective hypothermia, no nocturia, + poor sleep Eyes: + blurry vision, no xerophthalmia ENT: no sore throat, no nodules palpated in neck, no dysphagia, no odynophagia, + hoarseness, + tinnitus, no hypoacusis Cardiovascular: no CP, + SOB, no palpitations, + leg swelling Respiratory: no cough, + SOB, + wheezing Gastrointestinal: no N, no V, no D, + C, no acid reflux Musculoskeletal: no muscle, no joint aches Skin: no rash, no hair loss Neurological: no tremors, no numbness or tingling/no dizziness/no HAs Psychiatric: no depression, + anxiety  Past Medical History:  Diagnosis Date  . Cellulitis and abscess of face 10/11/2007   Qualifier: Diagnosis of  By: Amil Amen MD, Benjamine Mola    . Diabetic retinopathy ([place])   . Diabetic retinopathy  associated with type 2 diabetes mellitus ([place]) 09/13/2014   He works for [employer]  . Fluid overload 09/03/2012   Post op  . GERD (gastroesophageal reflux disease)   . Visual impairment    Past Surgical History:  Procedure Laterality Date  . CATARACT EXTRACTION W/ INTRAOCULAR LENS  IMPLANT, BILATERAL    . EYE SURGERY Bilateral    "laser OR for diabetic retinopathy"  . INGUINAL HERNIA REPAIR     [name] 07/12/2010), "don't remember which side"  . LAPAROTOMY  08/23/2012   Procedure: EXPLORATORY LAPAROTOMY;  Surgeon: [name], DO;  Location: [facility];  Service: General;  Laterality: N/A;  exploratory laparotomy with lysis of adhesions  . LYSIS OF ADHESION  08/23/2012   Procedure: LYSIS OF ADHESION;  Surgeon: [name], DO;  Location: [facility];  Service: General;;  . ROBOT ASSISTED LAPAROSCOPIC RADICAL PROSTATECTOMY  2000's   "had to finish manually after machine broke"  . SHOULDER SURGERY  1970's   separation; from playing football"   Social History   Socioeconomic History  . Marital status: Married    Spouse name: Not on file  . Number of children: 1  . Years of education: Not on file  . Highest education level: Not on file  Occupational History  .  Glass blower/designer  Social Needs  . Financial resource strain: Not on file  . Food insecurity    Worry: Not on file    Inability: Not on file  . Transportation needs    Medical: Not on file    Non-medical: Not on file  Tobacco Use  . Smoking status: Former Smoker    Packs/day: 2.50    Years: 3.00    Pack years: 7.50    Types: Cigarettes    Quit date: 11/09/1966    Years since quitting: 52.5  . Smokeless tobacco: Never Used  Substance and Sexual Activity  . Alcohol use: Yes    Comment: 04/08/2015 "maybe a beer/ or 2 or a glass of wine monthly"  . Drug use: No   Current Outpatient Medications on File Prior to Visit  Medication Sig Dispense Refill  . acetaminophen (TYLENOL) 650 MG CR tablet Take 650 mg by mouth  every 8 (eight) hours as needed for pain.    [name] albuterol (VENTOLIN HFA) 108 (90 Base) MCG/ACT inhaler Inhale 2 puffs into the lungs every 4 (four) hours as needed for wheezing or shortness of breath. 6.7 g 0  .  amLODipine (NORVASC) 5 MG tablet Take 1 tablet (5 mg total) by mouth daily. 30 tablet 0  . aspirin 81 MG chewable tablet Chew 81 mg by mouth every morning.     [name] atorvastatin (LIPITOR) 20 MG tablet Take 20 mg by mouth every morning.     . blood glucose meter kit and supplies KIT Dispense based on patient and insurance preference. Use up to four times daily as directed. (FOR ICD-9 250.00, 250.01). 1 each 1  . clindamycin (CLEOCIN) 300 MG capsule Take 1 capsule (300 mg total) by mouth 2 (two) times daily for 7 days. 14 capsule 0  . furosemide (LASIX) 40 MG tablet Take 1 tablet (40 mg total) by mouth daily. 30 tablet 0  . gabapentin (NEURONTIN) 300 MG capsule TAKE 1 CAPSULE(300 MG) BY MOUTH THREE TIMES DAILY (Patient taking differently: Take 300 mg by mouth 3 (three) times daily. ) 90 capsule 0  . Insulin Isophane & Regular Human (HUMULIN 70/30 MIX) (70-30) 100 UNIT/ML PEN Inject 20 Units into the skin daily before breakfast AND 16-26 Units daily before supper.    . Insulin Pen Needle (PEN NEEDLES 31GX5/16") 31G X 8 MM MISC Use new needle with each insulin injection, twice a day. Dx E11.65, Z79.4 100 each 5  . Lancets (FREESTYLE) lancets Use as instructed 100 each 0  . Lancets 30G MISC     . Multiple Vitamin (MULTIVITAMIN WITH MINERALS) TABS tablet Take 1 tablet by mouth every morning. Centrum    . mupirocin ointment (BACTROBAN) 2 % Apply to left foot once daily 22 g 0  . ondansetron (ZOFRAN) 4 MG tablet Take 1 tablet (4 mg total) by mouth daily as needed for nausea or vomiting. 20 tablet 0  . Polyethyl Glycol-Propyl Glycol (SYSTANE OP) Place 1 drop into both eyes daily as needed (dry eyes).    . polyethylene glycol (MIRALAX / GLYCOLAX) 17 g packet Take 17 g by mouth daily as needed for moderate  constipation. 14 each 0  . senna (SENOKOT) 8.6 MG TABS tablet Take 1 tablet (8.6 mg total) by mouth 2 (two) times daily. 30 tablet 0  . traMADol (ULTRAM) 50 MG tablet Take 1 tablet (50 mg total) by mouth every 6 (six) hours as needed for moderate pain (pain). 20 tablet 0   No current facility-administered medications on file prior to visit.    Allergies  Allergen Reactions  . Ace Inhibitors Itching and Cough  . Naproxen Anaphylaxis, Shortness Of Breath and Other (See Comments)    Throat swells. cannot breathe, and causes GI distress   Family History  Problem Relation Age of Onset  . Diabetes Mellitus II Mother   . Colon cancer Neg Hx     PE: BP (!) 170/90   Pulse 72   Ht '5\' 6"'  (1.676 m)   Wt 179 lb (81.2 kg)   SpO2 98%   BMI 28.89 kg/m  Wt Readings from Last 3 Encounters:  05/13/19 179 lb (81.2 kg)  05/05/19 176 lb 5.9 oz (80 kg)  04/26/19 182 lb 12.8 oz (82.9 kg)   Constitutional: slightly overweight, in NAD Eyes: PERRLA, EOMI, no exophthalmos ENT: moist mucous membranes, no thyromegaly, no cervical lymphadenopathy Cardiovascular: RRR, No MRG, + bilateral pitting LE edema Respiratory: CTA B Gastrointestinal: abdomen soft, NT, ND, BS+ Musculoskeletal: no deformities, strength intact in all 4, L foot in boot Skin: moist, warm, no rashes Neurological: no tremor with outstretched hands, DTR normal in all 4  ASSESSMENT: 1. DM2, insulin-dependent, uncontrolled,  with complications - CKD stage 3 - DR - L foot diabetic ulcer - PN with Charcot foot - ED  2. HTN  PLAN:  1. Patient with long-standing, very uncontrolled diabetes, noncompliant with his regimen of premixed insulin due to many factors including finances.  He has very limited resources and he cannot afford other types of medications for his diabetes.  Also, he now has 70/30 pens, which she was given from the hospital, but he cannot afford these.  We discussed that vials are more affordable and he would like to  check with his pharmacy about this and then switch to vials if much cheaper than pens. -We discussed about the importance of taking his insulin as prescribed, twice a day, before, rather than after meals, and not miss doses.  As of now he may miss days at the time and we discussed that in this case, we cannot control his diabetes.  He also will need to start checking his sugars, which he is not a now.  I advised him to buy a ReliOn glucometer from [employer]. -We also discussed about improving diet and I made specific suggestions especially about changing breakfast -For now, I will not change his 70/30 insulin doses significantly, since I would like to see how he does with these doses when he takes them consistently, twice a day, before meals.  We did discuss about the need to change the dose of the insulin depending on the size of his meals and he will try to do so. - I suggested to:  Patient Instructions  Please use: - 70/30 (ReliOn) insulin 16-20 units before b'fast and 16-20 units before dinner  Get a ReliOn glucometer from [employer].  Inject the insulin ~30 min before a meal.  Please let me know if the sugars are consistently <80 or >250.  Please return in 3 months with your sugar log.   - Strongly advised him to start checking sugars at different times of the day - check 2x a day, rotating checks - discussed about CBG targets for treatment: 80-130 mg/dL before meals and <180 mg/dL after meals; target HbA1c <7%. - given sugar log and advised how to fill it and to bring it at next appt  - given foot care handout and explained the principles  - given instructions for hypoglycemia management "15-15 rule"  - advised for yearly eye exams  - Return to clinic in 3 mo with sugar log   HTN - BP is high today as he did not take his Lasix-has to wait until the end of the week to be able to afford to refill the prescription.  Philemon Kingdom, MD PhD [facility] End[facility]

## 2019-05-13 NOTE — Telephone Encounter (Signed)
[employer] to ship a 30 day cardiac event monitor to the patients home.  Instructions will be included in the monitor kit. 

## 2019-05-14 ENCOUNTER — Encounter: Payer: Self-pay | Admitting: Vascular Surgery

## 2019-05-14 ENCOUNTER — Other Ambulatory Visit: Payer: Self-pay | Admitting: *Deleted

## 2019-05-14 ENCOUNTER — Other Ambulatory Visit: Payer: Self-pay

## 2019-05-14 ENCOUNTER — Ambulatory Visit (INDEPENDENT_AMBULATORY_CARE_PROVIDER_SITE_OTHER): Payer: Medicare Other | Admitting: Vascular Surgery

## 2019-05-14 ENCOUNTER — Ambulatory Visit (HOSPITAL_COMMUNITY)
Admission: RE | Admit: 2019-05-14 | Discharge: 2019-05-14 | Disposition: A | Discharge status: Home / Self Care | Payer: Medicare Other | Source: Ambulatory Visit | Attending: Family | Admitting: Family

## 2019-05-14 ENCOUNTER — Encounter: Payer: Self-pay | Admitting: *Deleted

## 2019-05-14 DIAGNOSIS — I739 Peripheral vascular disease, unspecified: Secondary | ICD-10-CM

## 2019-05-14 DIAGNOSIS — L97929 Non-pressure chronic ulcer of unspecified part of left lower leg with unspecified severity: Secondary | ICD-10-CM | POA: Diagnosis not present

## 2019-05-14 NOTE — Progress Notes (Signed)
Patient name: [name] MRN: [id number] DOB: [birth year] Sex: male  REASON FOR CONSULT: Left lower extremity diabetic ulcer  HPI: [name] is a 68 y.o. male, with history of diabetes, hypertension, hyperlipidemia, stage III chronic kidney disease that presents for evaluation of diabetic ulcer of the left midfoot.  Patient states he has had at ulcer on his left foot since at least February.  He is been followed by Dr. [name] for some time and has been trying to do wound care and has also been wearing compression socks for venous insufficiency of his lower extremities as well.  He states recently he elected to see a podiatrist and is now having this foot wound managed by podiatry.  He states this was recently debrided.  It has not completely healed since February.  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  He is on his feet for long periods of time.  Now wearing an offloading shoe on his left foot.  States no previous lower extremity interventions.  States he does not smoke.  Past Medical History:  Diagnosis Date  . Cellulitis and abscess of face 10/11/2007   Qualifier: Diagnosis of  By: Amil Amen MD, Benjamine Mola    . Diabetic retinopathy ([place])   . Diabetic retinopathy associated with type 2 diabetes mellitus ([place]) 09/13/2014   He works for [employer]  . Fluid overload 09/03/2012   Post op  . GERD (gastroesophageal reflux disease)   . Visual impairment     Past Surgical History:  Procedure Laterality Date  . CATARACT EXTRACTION W/ INTRAOCULAR LENS  IMPLANT, BILATERAL    . EYE SURGERY Bilateral    "laser OR for diabetic retinopathy"  . INGUINAL HERNIA REPAIR     [name] 07/12/2010), "don't remember which side"  . LAPAROTOMY  08/23/2012   Procedure: EXPLORATORY LAPAROTOMY;  Surgeon: [name], DO;  Location: [facility];  Service: General;  Laterality: N/A;  exploratory laparotomy with lysis of adhesions  . LYSIS OF ADHESION  08/23/2012   Procedure: LYSIS OF  ADHESION;  Surgeon: [name], DO;  Location: [facility];  Service: General;;  . ROBOT ASSISTED LAPAROSCOPIC RADICAL PROSTATECTOMY  2000's   "had to finish manually after machine broke"  . SHOULDER SURGERY  1970's   separation; from playing football"    Family History  Problem Relation Age of Onset  . Diabetes Mellitus II Mother   . Colon cancer Neg Hx     SOCIAL HISTORY: Social History   Socioeconomic History  . Marital status: Married    Spouse name: Not on file  . Number of children: Not on file  . Years of education: Not on file  . Highest education level: Not on file  Occupational History  . Not on file  Social Needs  . Financial resource strain: Not on file  . Food insecurity    Worry: Not on file    Inability: Not on file  . Transportation needs    Medical: Not on file    Non-medical: Not on file  Tobacco Use  . Smoking status: Former Smoker    Packs/day: 2.50    Years: 3.00    Pack years: 7.50    Types: Cigarettes    Quit date: 11/09/1966    Years since quitting: 52.5  . Smokeless tobacco: Never Used  Substance and Sexual Activity  . Alcohol use: Yes    Comment: 04/08/2015 "maybe a beer/ or 2 or a glass of  wine monthly"  . Drug use: No  . Sexual activity: Yes  Lifestyle  . Physical activity    Days per week: Not on file    Minutes per session: Not on file  . Stress: Not on file  Relationships  . Social Herbalist on phone: Not on file    Gets together: Not on file    Attends religious service: Not on file    Active member of club or organization: Not on file    Attends meetings of clubs or organizations: Not on file    Relationship status: Not on file  . Intimate partner violence    Fear of current or ex partner: Not on file    Emotionally abused: Not on file    Physically abused: Not on file    Forced sexual activity: Not on file  Other Topics Concern  . Not on file  Social History Narrative  . Not on file    Allergies  Allergen  Reactions  . Ace Inhibitors Itching and Cough  . Naproxen Anaphylaxis, Shortness Of Breath and Other (See Comments)    Throat swells. cannot breathe, and causes GI distress    Current Outpatient Medications  Medication Sig Dispense Refill  . acetaminophen (TYLENOL) 650 MG CR tablet Take 650 mg by mouth every 8 (eight) hours as needed for pain.    [name] albuterol (VENTOLIN HFA) 108 (90 Base) MCG/ACT inhaler Inhale 2 puffs into the lungs every 4 (four) hours as needed for wheezing or shortness of breath. 6.7 g 0  . amLODipine (NORVASC) 5 MG tablet Take 1 tablet (5 mg total) by mouth daily. 30 tablet 0  . aspirin 81 MG chewable tablet Chew 81 mg by mouth every morning.     [name] atorvastatin (LIPITOR) 20 MG tablet Take 20 mg by mouth every morning.     . blood glucose meter kit and supplies KIT Dispense based on patient and insurance preference. Use up to four times daily as directed. (FOR ICD-9 250.00, 250.01). 1 each 1  . clindamycin (CLEOCIN) 300 MG capsule Take 1 capsule (300 mg total) by mouth 2 (two) times daily for 7 days. 14 capsule 0  . furosemide (LASIX) 40 MG tablet Take 1 tablet (40 mg total) by mouth daily. 30 tablet 0  . gabapentin (NEURONTIN) 300 MG capsule TAKE 1 CAPSULE(300 MG) BY MOUTH THREE TIMES DAILY (Patient taking differently: Take 300 mg by mouth 3 (three) times daily. ) 90 capsule 0  . Insulin Isophane & Regular Human (HUMULIN 70/30 MIX) (70-30) 100 UNIT/ML PEN Inject 16-20 Units into the skin daily before breakfast AND 16-20 Units daily before supper. 15 mL   . Insulin Pen Needle (PEN NEEDLES 31GX5/16") 31G X 8 MM MISC Use new needle with each insulin injection, twice a day. Dx E11.65, Z79.4 100 each 5  . Lancets (FREESTYLE) lancets Use as instructed 100 each 0  . Lancets 30G MISC     . Multiple Vitamin (MULTIVITAMIN WITH MINERALS) TABS tablet Take 1 tablet by mouth every morning. Centrum    . mupirocin ointment (BACTROBAN) 2 % Apply to left foot once daily 22 g 0  . ondansetron  (ZOFRAN) 4 MG tablet Take 1 tablet (4 mg total) by mouth daily as needed for nausea or vomiting. 20 tablet 0  . Polyethyl Glycol-Propyl Glycol (SYSTANE OP) Place 1 drop into both eyes daily as needed (dry eyes).    . polyethylene glycol (MIRALAX / GLYCOLAX) 17 g packet Take  17 g by mouth daily as needed for moderate constipation. 14 each 0  . senna (SENOKOT) 8.6 MG TABS tablet Take 1 tablet (8.6 mg total) by mouth 2 (two) times daily. 30 tablet 0  . traMADol (ULTRAM) 50 MG tablet Take 1 tablet (50 mg total) by mouth every 6 (six) hours as needed for moderate pain (pain). 20 tablet 0   No current facility-administered medications for this visit.     REVIEW OF SYSTEMS:  '[X]'$  denotes positive finding, '[ ]'$  denotes negative finding Cardiac  Comments:  Chest pain or chest pressure:    Shortness of breath upon exertion:    Short of breath when lying flat:    Irregular heart rhythm:        Vascular    Pain in calf, thigh, or hip brought on by ambulation:    Pain in feet at night that wakes you up from your sleep:     Blood clot in your veins:    Leg swelling:         Pulmonary    Oxygen at home:    Productive cough:     Wheezing:         Neurologic    Sudden weakness in arms or legs:     Sudden numbness in arms or legs:     Sudden onset of difficulty speaking or slurred speech:    Temporary loss of vision in one eye:     Problems with dizziness:         Gastrointestinal    Blood in stool:     Vomited blood:         Genitourinary    Burning when urinating:     Blood in urine:        Psychiatric    Major depression:         Hematologic    Bleeding problems:    Problems with blood clotting too easily:        Skin    Rashes or ulcers:        Constitutional    Fever or chills:      PHYSICAL EXAM: Vitals:   05/14/19 1412  BP: 134/78  Pulse: 78  Resp: 14  Temp: 97.8 F (36.6 C)  TempSrc: Temporal  SpO2: 98%  Weight: 179 lb (81.2 kg)  Height: '5\' 6"'$  (1.676 m)     GENERAL: The patient is a well-nourished male, in no acute distress. The vital signs are documented above. CARDIAC: There is a regular rate and rhythm.  VASCULAR:  Palpable femoral pulses bilateral groins Right PT weakly palpable No palpable left pedal pulses Diabetic ulcer on plantar surface of left foot PULMONARY: There is good air exchange bilaterally without wheezing or rales. ABDOMEN: Soft and non-tender with normal pitched bowel sounds.  MUSCULOSKELETAL: There are no major deformities or cyanosis. NEUROLOGIC: No focal weakness or paresthesias are detected. SKIN: There are no ulcers or rashes noted. PSYCHIATRIC: The patient has a normal affect.  DATA:   ABIs unreliable.  Abnormal waveforms at both ankles.  On the left which is the side of interest monophasic waveforms.  Reduced toe pressure 42.  Assessment/Plan:  68 year old male that presents with nonhealing diabetic ulcer of his left lower extremity and that has been ongoing since February.  On exam he has no appreciable palpable pulses on the left foot.  He has monophasic signals.  In review of his ABIs from October 22, 2018 monophasic waveforms at the ankle, ABIs are likely  non-compressible, and he has severely reduced toe pressure.  I discussed with him that I think he likely has severe underlying PAD and infrainguinal disease as a contributing factor for failure of his wound to heal.  I have recommended lower extremity arteriogram and intervention.  Risks and benefits were discussed.  He had a left lower extremity reflux study today and has evidence of both deep and superficial reflux.  He has deep reflux in the common femoral and popliteal vein.  Superficial reflux throughout the great saphenous and small saphenous vein.  Discussed that after arterial intervention he would need to go back in compression to help treat his venous disease.  Marty Heck, MD [facility] Office: [phone]  Pager: [place]

## 2019-05-16 ENCOUNTER — Ambulatory Visit (HOSPITAL_BASED_OUTPATIENT_CLINIC_OR_DEPARTMENT_OTHER): Payer: Medicare Other

## 2019-05-16 ENCOUNTER — Other Ambulatory Visit: Payer: Self-pay

## 2019-05-16 ENCOUNTER — Emergency Department (HOSPITAL_COMMUNITY): Payer: Medicare Other

## 2019-05-16 ENCOUNTER — Encounter (HOSPITAL_COMMUNITY): Payer: Self-pay

## 2019-05-16 ENCOUNTER — Emergency Department (HOSPITAL_COMMUNITY)
Admission: EM | Admit: 2019-05-16 | Discharge: 2019-05-16 | Disposition: A | Discharge status: Home / Self Care | Payer: Medicare Other | Attending: Emergency Medicine | Admitting: Emergency Medicine

## 2019-05-16 DIAGNOSIS — M79672 Pain in left foot: Secondary | ICD-10-CM | POA: Insufficient documentation

## 2019-05-16 DIAGNOSIS — I129 Hypertensive chronic kidney disease with stage 1 through stage 4 chronic kidney disease, or unspecified chronic kidney disease: Secondary | ICD-10-CM | POA: Insufficient documentation

## 2019-05-16 DIAGNOSIS — Z87891 Personal history of nicotine dependence: Secondary | ICD-10-CM | POA: Diagnosis not present

## 2019-05-16 DIAGNOSIS — Z794 Long term (current) use of insulin: Secondary | ICD-10-CM | POA: Diagnosis not present

## 2019-05-16 DIAGNOSIS — Z7982 Long term (current) use of aspirin: Secondary | ICD-10-CM | POA: Insufficient documentation

## 2019-05-16 DIAGNOSIS — Z79899 Other long term (current) drug therapy: Secondary | ICD-10-CM | POA: Diagnosis not present

## 2019-05-16 DIAGNOSIS — M7989 Other specified soft tissue disorders: Secondary | ICD-10-CM | POA: Diagnosis not present

## 2019-05-16 DIAGNOSIS — N183 Chronic kidney disease, stage 3 unspecified: Secondary | ICD-10-CM | POA: Diagnosis not present

## 2019-05-16 DIAGNOSIS — E1122 Type 2 diabetes mellitus with diabetic chronic kidney disease: Secondary | ICD-10-CM | POA: Insufficient documentation

## 2019-05-16 DIAGNOSIS — R600 Localized edema: Secondary | ICD-10-CM | POA: Insufficient documentation

## 2019-05-16 NOTE — ED Provider Notes (Signed)
[facility] EMERGENCY DEPARTMENT Provider Note   CSN: [id number] Arrival date & time: 05/16/19  1558     History   Chief Complaint Chief Complaint  Patient presents with   Leg Pain    HPI [name] is a 68 y.o. male.     HPI  The patient is a 68 year old male with a hx of diabetic neuropathy and charcot foot, chronic diabetic wound of the left foot (followed by Podiatry) and subacute LLE swelling who presents today with left foot pain and left lower extremity swelling. The patient states that he was walking around today while wearing his boot when he developed worsening pain in his left foot. He presented to urgent care for evaluation. Providers at urgent care were concerned for possible DVT so he was sent to the ED for further evaluation. He endorses some pain in the lateral aspect of his left foot and some tenderness while walking. He also endorses LLE swelling that has been ongoing for a few days. He states that his ulcer has been present on his left foot since at least February.  On chart review, the patient was evaluated by vascular surgery in clinic on 05/14/19. He denies a smoking history. He had no palpable pulses in his left foot in clinic but had appreciable waveforms on Doppler. Dr. Carlis Abbott of Vascular was concern for severe underlying PAD and recommended further evaluation of his arteries via imaging, which he is scheduled for on Oct 15th. The patient was also noted to have venous insufficiency of his LLE at the time with asymmetric swelling in his LLE >RLE. Venous dopplers on 9/29 were negative for DVT.  The patient had been admitted to the hospital from 9/20 - 9/24 for a SBO and complete heart block (thought secondary to vagal response to SBO, no pacemaker recommended) during which time he was also evaluated for his chronic left foot ulceration. Podiatry evaluated and recommended continued wound care and prescribed a 14 day course of doxycycline, which  was switched to Clindamycin when a culture of the wound grew out group B strep.   Past Medical History:  Diagnosis Date   Cellulitis and abscess of face 10/11/2007   Qualifier: Diagnosis of  By: Amil Amen MD, Benjamine Mola     Diabetic retinopathy [facility])    Diabetic retinopathy associated with type 2 diabetes mellitus ([place]) 09/13/2014   He works for [employer] for the [employer] overload 09/03/2012   Post op   GERD (gastroesophageal reflux disease)    Visual impairment     Patient Active Problem List   Diagnosis Date Noted   PAD (peripheral artery disease) ([place]) 05/14/2019   Ulcer of left foot due to type 2 diabetes mellitus ([place])    Skin ulcer of left foot, limited to breakdown of skin ([place])    Charcot foot due to diabetes mellitus ([place])    Charcot's joint of foot, left    SBO (small bowel obstruction) ([place]) 05/06/2019   Complete heart block ([place]) 05/06/2019   Moderate protein-calorie malnutrition ([place])    Diabetic foot ulcer ([place]) 10/19/2018   Diabetic foot infection ([place])    Pseudophakia, both eyes 10/04/2018   Stage 3 chronic kidney disease 09/27/2018   Adrenal insufficiency ([place]) 04/09/2015   Peripheral edema 09/17/2014   Shortness of breath 09/17/2014   Bronchospasm, acute 09/16/2014   Partial small bowel obstruction ([place]) 09/16/2014   Hyperglycemia    Diabetic retinopathy associated with type 2 diabetes mellitus ([place]) 09/13/2014  Abdominal pain 09/13/2014   Cough 11/15/2012   Pharyngitis 09/14/2012   Fluid overload 09/03/2012   Ileus following gastrointestinal surgery ([place]) 09/03/2012   Hypokalemia 08/30/2012   Small bowel obstruction s/p LOA [id number] 08/21/2012   Abnormal EKG 08/21/2012   Chest pain 08/21/2012   Macular degeneration (senile) of retina 12/15/2011   Lens replaced 12/15/2011   Vitreous hemorrhage ([place]) 12/15/2011   Essential hypertension 08/19/2008   Regional enteritis of small intestine ([place]) 01/03/2008    ERECTILE DYSFUNCTION, ORGANIC 01/03/2008   Sarcoidosis 07/19/2007   Diabetes mellitus ([place]) 07/19/2007   HYPERLIPIDEMIA, MIXED 07/19/2007   CERUMEN IMPACTION, BILATERAL 07/19/2007   URINARY INCONTINENCE, STRESS, MALE 08/17/2006   PROSTATE CANCER, HX OF 08/17/2006   DIABETIC  RETINOPATHY 01/09/2002    Past Surgical History:  Procedure Laterality Date   CATARACT EXTRACTION W/ INTRAOCULAR LENS  IMPLANT, BILATERAL     EYE SURGERY Bilateral    "laser OR for diabetic retinopathy"   INGUINAL HERNIA REPAIR     [name] 07/12/2010), "don't remember which side"   LAPAROTOMY  08/23/2012   Procedure: EXPLORATORY LAPAROTOMY;  Surgeon: [name], DO;  Location: [facility] ORS;  Service: General;  Laterality: N/A;  exploratory laparotomy with lysis of adhesions   LYSIS OF ADHESION  08/23/2012   Procedure: LYSIS OF ADHESION;  Surgeon: [name], DO;  Location: [facility] ORS;  Service: General;;   ROBOT ASSISTED LAPAROSCOPIC RADICAL PROSTATECTOMY  2000's   "had to finish manually after machine broke"   SHOULDER SURGERY  1970's   separation; from playing football"        Home Medications    Prior to Admission medications   Medication Sig Start Date End Date Taking? Authorizing Provider  acetaminophen (TYLENOL) 650 MG CR tablet Take 650 mg by mouth every 8 (eight) hours as needed for pain.    [provider]  albuterol (VENTOLIN HFA) 108 (90 Base) MCG/ACT inhaler Inhale 2 puffs into the lungs every 4 (four) hours as needed for wheezing or shortness of breath. 05/09/19   Aline August, MD  amLODipine (NORVASC) 5 MG tablet Take 1 tablet (5 mg total) by mouth daily. 05/09/19   Aline August, MD  aspirin 81 MG chewable tablet Chew 81 mg by mouth every morning.     [provider]  atorvastatin (LIPITOR) 20 MG tablet Take 20 mg by mouth every morning.  09/27/18   [provider]  blood glucose meter kit and supplies KIT Dispense based on patient and insurance preference. Use  up to four times daily as directed. (FOR ICD-9 250.00, 250.01). 10/24/18   Domenic Polite, MD  clindamycin (CLEOCIN) 300 MG capsule Take 1 capsule (300 mg total) by mouth 2 (two) times daily for 7 days. 05/10/19 05/17/19  Marzetta Board, DPM  furosemide (LASIX) 40 MG tablet Take 1 tablet (40 mg total) by mouth daily. 05/13/19   Aline August, MD  gabapentin (NEURONTIN) 300 MG capsule TAKE 1 CAPSULE(300 MG) BY MOUTH THREE TIMES DAILY Patient taking differently: Take 300 mg by mouth 3 (three) times daily.  02/12/19   [name], MD  Insulin Isophane & Regular Human (HUMULIN 70/30 MIX) (70-30) 100 UNIT/ML PEN Inject 16-20 Units into the skin daily before breakfast AND 16-20 Units daily before supper. 05/13/19   Philemon Kingdom, MD  Insulin Pen Needle (PEN NEEDLES 31GX5/16") 31G X 8 MM MISC Use new needle with each insulin injection, twice a day. Dx E11.65, Z79.4 04/19/19   [name], MD  Lancets (FREESTYLE) lancets  Use as instructed 09/18/14   Delfina Redwood, MD  Lancets 30G [place]  04/06/16   [provider]  Multiple Vitamin (MULTIVITAMIN WITH MINERALS) TABS tablet Take 1 tablet by mouth every morning. Centrum    [provider]  mupirocin ointment (BACTROBAN) 2 % Apply to left foot once daily 05/10/19 05/09/20  Marzetta Board, DPM  ondansetron (ZOFRAN) 4 MG tablet Take 1 tablet (4 mg total) by mouth daily as needed for nausea or vomiting. 05/09/19   Aline August, MD  Polyethyl Glycol-Propyl Glycol (SYSTANE OP) Place 1 drop into both eyes daily as needed (dry eyes).    [provider]  polyethylene glycol (MIRALAX / GLYCOLAX) 17 g packet Take 17 g by mouth daily as needed for moderate constipation. 05/09/19   Aline August, MD  senna (SENOKOT) 8.6 MG TABS tablet Take 1 tablet (8.6 mg total) by mouth 2 (two) times daily. 05/09/19   Aline August, MD  traMADol (ULTRAM) 50 MG tablet Take 1 tablet (50 mg total) by mouth every 6 (six) hours as needed for moderate  pain (pain). 05/09/19   Aline August, MD    Family History Family History  Problem Relation Age of Onset   Diabetes Mellitus II Mother    Colon cancer Neg Hx     Social History Social History   Tobacco Use   Smoking status: Former Smoker    Packs/day: 2.50    Years: 3.00    Pack years: 7.50    Types: Cigarettes    Quit date: 11/09/1966    Years since quitting: 52.5   Smokeless tobacco: Never Used  Substance Use Topics   Alcohol use: Yes    Comment: 04/08/2015 "maybe a beer/ or 2 or a glass of wine monthly"   Drug use: No     Allergies   Ace inhibitors and Naproxen   Review of Systems Review of Systems  Constitutional: Negative for chills and fever.  HENT: Negative for ear pain and sore throat.   Eyes: Negative for pain and visual disturbance.  Respiratory: Negative for cough and shortness of breath.   Cardiovascular: Positive for leg swelling. Negative for chest pain and palpitations.  Gastrointestinal: Negative for abdominal pain and vomiting.  Genitourinary: Negative for dysuria and hematuria.  Musculoskeletal: Negative for arthralgias and back pain.  Skin: Positive for pallor and wound. Negative for color change and rash.  Neurological: Negative for seizures and syncope.  All other systems reviewed and are negative.    Physical Exam Updated Vital Signs BP (!) 196/82 (BP Location: Left Arm)    Pulse 65    Temp 97.8 F (36.6 C) (Oral)    Resp 18    SpO2 100%   Physical Exam Vitals signs and nursing note reviewed.  Constitutional:      Appearance: He is well-developed.  HENT:     Head: Normocephalic and atraumatic.  Eyes:     Conjunctiva/sclera: Conjunctivae normal.  Neck:     Musculoskeletal: Neck supple.  Cardiovascular:     Rate and Rhythm: Normal rate and regular rhythm.     Heart sounds: No murmur.  Pulmonary:     Effort: Pulmonary effort is normal. No respiratory distress.     Breath sounds: Normal breath sounds.  Abdominal:      Palpations: Abdomen is soft.     Tenderness: There is no abdominal tenderness.  Musculoskeletal:     Right lower leg: Edema present.     Left lower leg: Edema present.  Comments: Bilateral LE edema, LLE 3+ and greater than RLE 1+. No palpable TTP of the LLE. No significant erythema appreciated. No palpable pulse in the LLE, waveform appreciated for PT and DP pulses on Doppler.  Skin:    General: Skin is warm and dry.     Comments: LLE plantar wound appears to be well healing with no evidence of necrosis or current open ulceration.  Neurological:     Mental Status: He is alert.      ED Treatments / Results  Labs (all labs ordered are listed, but only abnormal results are displayed) Labs Reviewed - No data to display  EKG None  Radiology Dg Foot Complete Left  Result Date: 05/16/2019 CLINICAL DATA:  Per [facility], Pt has an ulcer on left foot but will not stay off of it. Pt went to [employer] and did some walking around town today. Pt is now complaining of increased pain. Went to urgent care and sent here for possible DVT. Diabetic ulcer EXAM: LEFT FOOT - COMPLETE 3+ VIEW COMPARISON:  04/30/2019 FINDINGS: No fracture. No bone resorption is seen to suggest osteomyelitis. Location of the soft tissue ulcer is not evident radiographically. There are mixed lytic and sclerotic changes involving the medial and lateral cuneiform metatarsal articulations, the cuboid metatarsal articulations and the navicular medial cuneiform articulation. These findings are stable consistent with diabetic neuropathic osteoarthropathy. There is generalized soft tissue edema. Arterial vascular calcifications are noted. Small plantar calcaneal spur. Patient has a short fourth metatarsal, developmental and stable. IMPRESSION: 1. No acute skeletal abnormalities. No evidence of a fracture or of osteomyelitis. 2. Chronic findings of neuropathic osteoarthropathy. 3. Nonspecific soft tissue swelling.  No soft tissue air.  Electronically Signed   By: Lajean Manes M.D.   On: 05/16/2019 19:21   Vas [place] Lower Extremity Venous (dvt) (only [facility])  Result Date: 05/16/2019  Lower Venous Study Indications: Swelling.  Limitations: Significant edema. Comparison Study: Lower extremity venous reflux from 05/14/19 is available for                   comparison Performing Technologist: Sharion Dove RVS  Examination Guidelines: A complete evaluation includes B-mode imaging, spectral Doppler, color Doppler, and power Doppler as needed of all accessible portions of each vessel. Bilateral testing is considered an integral part of a complete examination. Limited examinations for reoccurring indications may be performed as noted.  +-----+---------------+---------+-----------+----------+--------------+  RIGHT Compressibility Phasicity Spontaneity Properties Thrombus Aging  +-----+---------------+---------+-----------+----------+--------------+  CFV   Full            Yes       Yes                                    +-----+---------------+---------+-----------+----------+--------------+   +---------+---------------+---------+-----------+----------+--------------+  LEFT      Compressibility Phasicity Spontaneity Properties Thrombus Aging  +---------+---------------+---------+-----------+----------+--------------+  CFV       Full            Yes       Yes                                    +---------+---------------+---------+-----------+----------+--------------+  SFJ       Full                                                             +---------+---------------+---------+-----------+----------+--------------+  FV Prox   Full                                                             +---------+---------------+---------+-----------+----------+--------------+  FV Mid    Full                                                             +---------+---------------+---------+-----------+----------+--------------+  FV Distal Full                                                              +---------+---------------+---------+-----------+----------+--------------+  PFV       Full                                                             +---------+---------------+---------+-----------+----------+--------------+  POP       Full            Yes       Yes                                    +---------+---------------+---------+-----------+----------+--------------+  PTV       Full                                                             +---------+---------------+---------+-----------+----------+--------------+  PERO      Full                                                             +---------+---------------+---------+-----------+----------+--------------+     Summary: Right: No evidence of common femoral vein obstruction. Left: There is no evidence of deep vein thrombosis in the lower extremity.  *See table(s) above for measurements and observations.    Preliminary     Procedures Procedures (including critical care time)  Medications Ordered in ED Medications - No data to display   Initial Impression / Assessment and Plan / ED Course  I have reviewed the triage vital signs and the nursing notes.  Pertinent labs & imaging results that were available during my care of the patient were reviewed by me and considered in my medical decision making (see chart for details).        68 year old male presenting with chronic diabetic  foot ulcer, likely worsened by severe PAD per vascular surgery notes with additional concern for venous disease. On exam, LLE swelling >RLE swelling present without significant cellulitic changes. LLE chronic wound appears to be healing well without signs of necrosis or infection. The patient has chronic venous stasis changes noted in his lower extremities. He is currently being evaluated outpatient for PVD and had arterial studies ordered.   Venous duplex US study of the LLE was negative for DVT. The patient had appreciable  pulses in the LLE on doppler. No evidence of cellulitis on exam. He is currently being evaluated outpatient by vascular surgery for PAD.  Regarding the patient's foot pain, an XR was performed which did not reveal any acute skeletal abnormalities, specifically, no evidence of fracture or osteomyelitis. Chronic findings of charcot foot were evident in addition to nonspecific soft tissue swelling without soft tissue air. On repeat visual exam, there was no warmth or erythema to suggest cellulitis. The patient had previously been on a regimen of Doxycycline and Clindamycin. Do not feel that antibiotics are warranted at this time. Recommended that the patient follow-up in clinic with Podiatry and with Vascular surgery. Stable for discharge.   Final Clinical Impressions(s) / ED Diagnoses   Final diagnoses:  Left foot pain    ED Discharge Orders    None       Regan Lemming, MD 05/17/19 0525    Gareth Morgan, MD 05/17/19 2253

## 2019-05-16 NOTE — Progress Notes (Signed)
VASCULAR LAB PRELIMINARY  PRELIMINARY  PRELIMINARY  PRELIMINARY  Left lower extremity venous duplex completed.    Preliminary report:  See CV proc for preliminary results.  Gave report to Dr. Heloise Purpura, [facility], RVT 05/16/2019, 5:58 PM

## 2019-05-16 NOTE — Discharge Instructions (Addendum)
Please follow-up with Podiatry and vascular surgery regarding your chronic foot wound, swelling, and foot pain. Your XR did not show any evidence of acute infection. Your skin does not appear infected. Your foot wound does not appear to be infected. There was no evidence of blood clot in your left leg.

## 2019-05-16 NOTE — ED Triage Notes (Signed)
Per [facility], Pt has an ulcer on left foot but will not stay off of it. Pt went to [employer] and did some walking around town today. Pt is now complaining of increased pain. Went to urgent care and sent here for possible DVT.

## 2019-05-17 ENCOUNTER — Emergency Department (HOSPITAL_COMMUNITY)
Admission: EM | Admit: 2019-05-17 | Discharge: 2019-05-17 | Disposition: A | Discharge status: Home / Self Care | Payer: Medicare Other | Attending: Emergency Medicine | Admitting: Emergency Medicine

## 2019-05-17 ENCOUNTER — Other Ambulatory Visit: Payer: Self-pay

## 2019-05-17 ENCOUNTER — Ambulatory Visit: Payer: Medicare Other | Admitting: Podiatry

## 2019-05-17 ENCOUNTER — Ambulatory Visit (INDEPENDENT_AMBULATORY_CARE_PROVIDER_SITE_OTHER): Payer: Medicare Other | Admitting: Family Medicine

## 2019-05-17 ENCOUNTER — Encounter: Payer: Self-pay | Admitting: Family Medicine

## 2019-05-17 VITALS — BP 138/76 | HR 60 | Temp 98.5°F | Ht 66.0 in | Wt 180.0 lb

## 2019-05-17 DIAGNOSIS — Z5321 Procedure and treatment not carried out due to patient leaving prior to being seen by health care provider: Secondary | ICD-10-CM | POA: Insufficient documentation

## 2019-05-17 DIAGNOSIS — I442 Atrioventricular block, complete: Secondary | ICD-10-CM

## 2019-05-17 DIAGNOSIS — E08621 Diabetes mellitus due to underlying condition with foot ulcer: Secondary | ICD-10-CM

## 2019-05-17 DIAGNOSIS — E11649 Type 2 diabetes mellitus with hypoglycemia without coma: Secondary | ICD-10-CM | POA: Diagnosis not present

## 2019-05-17 DIAGNOSIS — Z794 Long term (current) use of insulin: Secondary | ICD-10-CM

## 2019-05-17 DIAGNOSIS — N1832 Chronic kidney disease, stage 3b: Secondary | ICD-10-CM

## 2019-05-17 DIAGNOSIS — L97421 Non-pressure chronic ulcer of left heel and midfoot limited to breakdown of skin: Secondary | ICD-10-CM | POA: Diagnosis not present

## 2019-05-17 DIAGNOSIS — Z597 Insufficient social insurance and welfare support: Secondary | ICD-10-CM

## 2019-05-17 DIAGNOSIS — Z8719 Personal history of other diseases of the digestive system: Secondary | ICD-10-CM | POA: Diagnosis not present

## 2019-05-17 LAB — BASIC METABOLIC PANEL
Anion gap: 8 (ref 5–15)
BUN: 12 mg/dL (ref 8–23)
CO2: 25 mmol/L (ref 22–32)
Calcium: 9.2 mg/dL (ref 8.9–10.3)
Chloride: 106 mmol/L (ref 98–111)
Creatinine, Ser: 1.55 mg/dL — ABNORMAL HIGH (ref 0.61–1.24)
GFR calc Af Amer: 52 mL/min — ABNORMAL LOW (ref >60)
GFR calc non Af Amer: 45 mL/min — ABNORMAL LOW (ref >60)
Glucose, Bld: 78 mg/dL (ref 70–99)
Potassium: 4.1 mmol/L (ref 3.5–5.1)
Sodium: 139 mmol/L (ref 135–145)

## 2019-05-17 LAB — CBC WITH DIFFERENTIAL/PLATELET
Abs Immature Granulocytes: 0.01 10*3/uL (ref 0.00–0.07)
Basophils Absolute: 0 10*3/uL (ref 0.0–0.1)
Basophils Relative: 1 %
Eosinophils Absolute: 0.1 10*3/uL (ref 0.0–0.5)
Eosinophils Relative: 3 %
HCT: 34 % — ABNORMAL LOW (ref 39.0–52.0)
Hemoglobin: 10.5 g/dL — ABNORMAL LOW (ref 13.0–17.0)
Immature Granulocytes: 0 %
Lymphocytes Relative: 21 %
Lymphs Abs: 0.7 10*3/uL (ref 0.7–4.0)
MCH: 21 pg — ABNORMAL LOW (ref 26.0–34.0)
MCHC: 30.9 g/dL (ref 30.0–36.0)
MCV: 68.1 fL — ABNORMAL LOW (ref 80.0–100.0)
Monocytes Absolute: 0.5 10*3/uL (ref 0.1–1.0)
Monocytes Relative: 13 %
Neutro Abs: 2.3 10*3/uL (ref 1.7–7.7)
Neutrophils Relative %: 62 %
Platelets: 136 10*3/uL — ABNORMAL LOW (ref 150–400)
RBC: 4.99 MIL/uL (ref 4.22–5.81)
RDW: 17.9 % — ABNORMAL HIGH (ref 11.5–15.5)
WBC: 3.6 10*3/uL — ABNORMAL LOW (ref 4.0–10.5)
nRBC: 0 % (ref 0.0–0.2)

## 2019-05-17 LAB — GLUCOSE, POCT (MANUAL RESULT ENTRY)
POC Glucose: 56 mg/dl — AB (ref 70–99)
POC Glucose: 61 mg/dl — AB (ref 70–99)
POC Glucose: 63 mg/dl — AB (ref 70–99)
POC Glucose: 65 mg/dl — AB (ref 70–99)

## 2019-05-17 LAB — GLUCOSE, RANDOM: Glucose: 53 mg/dL — ABNORMAL LOW (ref 65–99)

## 2019-05-17 NOTE — ED Notes (Signed)
Pt states he is leaving due to wait.  Encouraged him to stay and he declined.

## 2019-05-17 NOTE — ED Triage Notes (Signed)
Arrives via [facility] from his pcp with c/o of hypoglycemia. Pt states he took 20 units of insulin and then his ride came to pick him up for his appt and was unable to eat. Pt had cbg of 50 at pcp and was given 30 G of oral glucose. Pt has cbg of 108 on arrival to ED.

## 2019-05-17 NOTE — Progress Notes (Signed)
10/2/20209:56 AM  [name] 1951/05/28, 68 y.o., male [id number]  Chief Complaint  Patient presents with  . Hypertension  . Diabetes    pain in left foot, stepped on rock, sent to ER    HPI:   Patient is a 68 y.o. male with past medical history significant for HTN, [id number] complications,HLP who presents today forhosp followup  Last OV sept 2020 Since then hosp from 920-24th for SBO, conservatively managed Found to be in 3rd degree heart block Has seen cards outpatient - plan for 30 day monitor, thoughts were heart block 2nd increase vagal tone due to SBO Has established care with endo Has seen vasc surg with plans for abd aortogram with LE Patient cont to see podiatry for Left foot diabetic ulcer - healing better, changed to clinda per wound cx In hosp had worsening renal function Yesterday seen in er for left foot pain Has been out of work due to foot ulcer Financially very limited, very stressed  He is feeling like his sugar is low, dizzy and weak Took his insulin this morning but unable to eat as scat bus was at his home 70/30 20 units  Reports that he has been afraid to eat much since he was discharged from the hospital, has been eating mostly fruits and veggies Reports normal BM  He reports his wife has kicked him out of the house He has a restraining order against him Has been staying with friends States he is coping ok, has faith,  Denies depression Denies SI Declines counseling  Has an appt this afternoon with podiatry Using his walker as much as possible  He does not want to followup with cardiology - worried about finances  Lab Results  Component Value Date   CREATININE 2.16 (H) 05/09/2019   BUN 30 (H) 05/09/2019   NA 137 05/09/2019   K 3.9 05/09/2019   CL 107 05/09/2019   CO2 24 05/09/2019    Depression screen PHQ 2/9 05/17/2019 04/19/2019 02/07/2019  Decreased Interest 0 0 0  Down, Depressed, Hopeless 0 0 0  PHQ - 2 Score 0 0 0     Fall Risk  05/17/2019 04/19/2019 02/07/2019 07/18/2018  Falls in the past year? 0 0 0 0  Number falls in past yr: 0 0 0 -  Injury with Fall? 0 0 0 -     Allergies  Allergen Reactions  . Ace Inhibitors Itching and Cough  . Naproxen Anaphylaxis, Shortness Of Breath and Other (See Comments)    Throat swells. cannot breathe, and causes GI distress    Prior to Admission medications   Medication Sig Start Date End Date Taking? Authorizing Provider  acetaminophen (TYLENOL) 650 MG CR tablet Take 650 mg by mouth every 8 (eight) hours as needed for pain.   Yes [provider]  albuterol (VENTOLIN HFA) 108 (90 Base) MCG/ACT inhaler Inhale 2 puffs into the lungs every 4 (four) hours as needed for wheezing or shortness of breath. 05/09/19  Yes Aline August, MD  amLODipine (NORVASC) 5 MG tablet Take 1 tablet (5 mg total) by mouth daily. 05/09/19  Yes Aline August, MD  aspirin 81 MG chewable tablet Chew 81 mg by mouth every morning.    Yes [provider]  atorvastatin (LIPITOR) 20 MG tablet Take 20 mg by mouth every morning.  09/27/18  Yes [provider]  blood glucose meter kit and supplies KIT Dispense based on patient and insurance preference. Use up to four times daily as  directed. (FOR ICD-9 250.00, 250.01). 10/24/18  Yes Domenic Polite, MD  clindamycin (CLEOCIN) 300 MG capsule Take 1 capsule (300 mg total) by mouth 2 (two) times daily for 7 days. 05/10/19 05/17/19 Yes Marzetta Board, DPM  furosemide (LASIX) 40 MG tablet Take 1 tablet (40 mg total) by mouth daily. 05/13/19  Yes Aline August, MD  gabapentin (NEURONTIN) 300 MG capsule TAKE 1 CAPSULE(300 MG) BY MOUTH THREE TIMES DAILY Patient taking differently: Take 300 mg by mouth 3 (three) times daily.  02/12/19  Yes [name], MD  Insulin Isophane & Regular Human (HUMULIN 70/30 MIX) (70-30) 100 UNIT/ML PEN Inject 16-20 Units into the skin daily before breakfast AND 16-20 Units daily before supper. 05/13/19  Yes  Philemon Kingdom, MD  Insulin Pen Needle (PEN NEEDLES 31GX5/16") 31G X 8 MM MISC Use new needle with each insulin injection, twice a day. Dx E11.65, Z79.4 04/19/19  Yes [name], MD  Lancets (FREESTYLE) lancets Use as instructed 09/18/14  Yes Delfina Redwood, MD  Lancets 30G MISC  04/06/16  Yes [provider]  Multiple Vitamin (MULTIVITAMIN WITH MINERALS) TABS tablet Take 1 tablet by mouth every morning. Centrum   Yes [provider]  mupirocin ointment (BACTROBAN) 2 % Apply to left foot once daily 05/10/19 05/09/20 Yes [place], Stephani Police, DPM  ondansetron (ZOFRAN) 4 MG tablet Take 1 tablet (4 mg total) by mouth daily as needed for nausea or vomiting. 05/09/19  Yes Aline August, MD  Polyethyl Glycol-Propyl Glycol (SYSTANE OP) Place 1 drop into both eyes daily as needed (dry eyes).   Yes [provider]  polyethylene glycol (MIRALAX / GLYCOLAX) 17 g packet Take 17 g by mouth daily as needed for moderate constipation. 05/09/19  Yes Aline August, MD  senna (SENOKOT) 8.6 MG TABS tablet Take 1 tablet (8.6 mg total) by mouth 2 (two) times daily. 05/09/19  Yes Aline August, MD  traMADol (ULTRAM) 50 MG tablet Take 1 tablet (50 mg total) by mouth every 6 (six) hours as needed for moderate pain (pain). 05/09/19  Yes Aline August, MD    Past Medical History:  Diagnosis Date  . Cellulitis and abscess of face 10/11/2007   Qualifier: Diagnosis of  By: Amil Amen MD, Benjamine Mola    . Diabetic retinopathy ([place])   . Diabetic retinopathy associated with type 2 diabetes mellitus ([place]) 09/13/2014   He works for [employer]  . Fluid overload 09/03/2012   Post op  . GERD (gastroesophageal reflux disease)   . Visual impairment     Past Surgical History:  Procedure Laterality Date  . CATARACT EXTRACTION W/ INTRAOCULAR LENS  IMPLANT, BILATERAL    . EYE SURGERY Bilateral    "laser OR for diabetic retinopathy"  . INGUINAL HERNIA REPAIR     [name] 07/12/2010), "don't  remember which side"  . LAPAROTOMY  08/23/2012   Procedure: EXPLORATORY LAPAROTOMY;  Surgeon: [name], DO;  Location: [facility];  Service: General;  Laterality: N/A;  exploratory laparotomy with lysis of adhesions  . LYSIS OF ADHESION  08/23/2012   Procedure: LYSIS OF ADHESION;  Surgeon: [name], DO;  Location: [facility];  Service: General;;  . ROBOT ASSISTED LAPAROSCOPIC RADICAL PROSTATECTOMY  2000's   "had to finish manually after machine broke"  . SHOULDER SURGERY  1970's   separation; from playing football"    Social History   Tobacco Use  . Smoking status: Former Smoker    Packs/day: 2.50    Years: 3.00  Pack years: 7.50    Types: Cigarettes    Quit date: 11/09/1966    Years since quitting: 52.5  . Smokeless tobacco: Never Used  Substance Use Topics  . Alcohol use: Yes    Comment: 04/08/2015 "maybe a beer/ or 2 or a glass of wine monthly"    Family History  Problem Relation Age of Onset  . Diabetes Mellitus II Mother   . Colon cancer Neg Hx     Review of Systems  Constitutional: Negative for chills and fever.  Respiratory: Negative for cough and shortness of breath.   Cardiovascular: Negative for chest pain, palpitations and leg swelling.  Gastrointestinal: Negative for abdominal pain, blood in stool, constipation, melena, nausea and vomiting.  per hpi   OBJECTIVE:  Today's Vitals   05/17/19 0942  BP: 138/76  Pulse: 60  Temp: 98.5 F (36.9 C)  SpO2: 100%  Weight: 180 lb (81.6 kg)  Height: '5\' 6"'  (1.676 m)   Body mass index is 29.05 kg/m.   Physical Exam Vitals signs and nursing note reviewed.  Constitutional:      Appearance: He is well-developed.  HENT:     Head: Normocephalic and atraumatic.  Eyes:     Conjunctiva/sclera: Conjunctivae normal.     Pupils: Pupils are equal, round, and reactive to light.  Neck:     Musculoskeletal: Neck supple.  Cardiovascular:     Rate and Rhythm: Normal rate and regular rhythm.     Heart sounds: No murmur. No  friction rub. No gallop.   Pulmonary:     Effort: Pulmonary effort is normal.     Breath sounds: Normal breath sounds. No wheezing or rales.  Abdominal:     General: Bowel sounds are normal. There is no distension.     Palpations: Abdomen is soft. There is no hepatomegaly or splenomegaly.     Tenderness: There is no abdominal tenderness.  Musculoskeletal:     Right lower leg: Edema present.     Left lower leg: Edema present.  Skin:    General: Skin is warm and dry.  Neurological:     Mental Status: He is alert and oriented to person, place, and time.     Results for orders placed or performed in visit on 05/17/19 (from the past 24 hour(s))  POCT glucose (manual entry)     Status: Abnormal   Collection Time: 05/17/19 10:13 AM  Result Value Ref Range   POC Glucose 56 (A) 70 - 99 mg/dl  POCT glucose (manual entry)     Status: Abnormal   Collection Time: 05/17/19 10:36 AM  Result Value Ref Range   POC Glucose 63 (A) 70 - 99 mg/dl  POCT glucose (manual entry)     Status: Abnormal   Collection Time: 05/17/19 11:10 AM  Result Value Ref Range   POC Glucose 61 (A) 70 - 99 mg/dl  POCT glucose (manual entry)     Status: Abnormal   Collection Time: 05/17/19 11:43 AM  Result Value Ref Range   POC Glucose 65 (A) 70 - 99 mg/dl   Given apple juice - 15 grams, prior to being able to check his glucose Given glucotrol 15 gel - 15 grams @ 1013, at time of glucose stick, 56 Ate an orange, repeat glucose 15 minutes later 61 Another glucotrol 15 given - recheck 15 mins later glc 65  My interpretation of EKG:  Sinus, HR 56, 1st degree block, PR 304 msec, no acute ST changes  ASSESSMENT and PLAN  1. Type 2 diabetes mellitus with hypoglycemia without coma, with long-term current use of insulin ([place]) Patient with persistent hypoglycemia despite efforts for correction in clinic. Patient currently homeless, uses public transportation and no secure access to food. Concern for worsening hypoglycemia  leading to fall/injury or life threatening events. Referring to ER via ambulance for further monitoring and treatment. - POCT glucose (manual entry) - POCT glucose (manual entry) - POCT glucose (manual entry) - POCT glucose (manual entry)  2. Diabetic ulcer of left midfoot associated with diabetes mellitus due to underlying condition, limited to breakdown of skin [facility]) Patient was scheduled for followup today. Clinic notified re ER transport.  - CBC   3. Stage 3b chronic kidney disease Acute worsening during recent hosp. Maybe contributing to persistent hypoglycemia. Labs pending.  - Comprehensive metabolic panel  4. H/O small bowel obstruction Resolved. Patient doing well. Tolerating solids, reassuring exam. RTC precautions reviewed.  5. Complete heart block ([place]) Resolved while in hosp. Cards consulted. Thought to be increased vagal tone. Patient declines further eval at this time. - EKG 12-Lead - none seen today  6. Insufficient social insurance and welfare support Patient with significant recent changes in social support and ability to meet basic needs. He denies depression and declines interventions in that regard. Referring to [facility] for case management. Most pressing issue is housing. Patient DM on insulin, needs access to refrigeration. Trying to heal charchot foot and DM foot ulcer - needs to be able to elevate and minimize weight bearing.  - AMB Referral to [facility]  Return in about 2 weeks (around 05/31/2019).    [name], MD [facility] [place], [place] [address] Ph.  [phone] Fax [phone]

## 2019-05-17 NOTE — Patient Instructions (Signed)
° ° ° °  If you have lab work done today you will be contacted with your lab results within the next 2 weeks.  If you have not heard from us then please contact us. The fastest way to get your results is to register for My Chart. ° ° °IF you received an x-ray today, you will receive an invoice from [facility]. Please contact  Radiology at [phone] with questions or concerns regarding your invoice.  ° °IF you received labwork today, you will receive an invoice from [facility]. Please contact [facility] at [phone] with questions or concerns regarding your invoice.  ° °Our billing staff will not be able to assist you with questions regarding bills from these companies. ° °You will be contacted with the lab results as soon as they are available. The fastest way to get your results is to activate your My Chart account. Instructions are located on the last page of this paperwork. If you have not heard from us regarding the results in 2 weeks, please contact this office. °  ° ° ° °

## 2019-05-18 LAB — CBC
Hematocrit: 33.4 % — ABNORMAL LOW (ref 37.5–51.0)
Hemoglobin: 9.9 g/dL — ABNORMAL LOW (ref 13.0–17.7)
MCH: 19.6 pg — ABNORMAL LOW (ref 26.6–33.0)
MCHC: 29.6 g/dL — ABNORMAL LOW (ref 31.5–35.7)
MCV: 66 fL — ABNORMAL LOW (ref 79–97)
Platelets: 156 10*3/uL (ref 150–450)
RBC: 5.04 x10E6/uL (ref 4.14–5.80)
RDW: 18.7 % — ABNORMAL HIGH (ref 11.6–15.4)
WBC: 3.6 10*3/uL (ref 3.4–10.8)

## 2019-05-18 LAB — COMPREHENSIVE METABOLIC PANEL
ALT: 31 IU/L (ref 0–44)
AST: 30 IU/L (ref 0–40)
Albumin/Globulin Ratio: 1.3 (ref 1.2–2.2)
Albumin: 3.6 g/dL — ABNORMAL LOW (ref 3.8–4.8)
Alkaline Phosphatase: 84 IU/L (ref 39–117)
BUN/Creatinine Ratio: 8 — ABNORMAL LOW (ref 10–24)
BUN: 14 mg/dL (ref 8–27)
Bilirubin Total: 0.8 mg/dL (ref 0.0–1.2)
CO2: 24 mmol/L (ref 20–29)
Calcium: 9.3 mg/dL (ref 8.6–10.2)
Chloride: 106 mmol/L (ref 96–106)
Creatinine, Ser: 1.66 mg/dL — ABNORMAL HIGH (ref 0.76–1.27)
GFR calc Af Amer: 48 mL/min/{1.73_m2} — ABNORMAL LOW (ref >59)
GFR calc non Af Amer: 41 mL/min/{1.73_m2} — ABNORMAL LOW (ref >59)
Globulin, Total: 2.8 g/dL (ref 1.5–4.5)
Glucose: 50 mg/dL — ABNORMAL LOW (ref 65–99)
Potassium: 4.8 mmol/L (ref 3.5–5.2)
Sodium: 142 mmol/L (ref 134–144)
Total Protein: 6.4 g/dL (ref 6.0–8.5)

## 2019-05-21 ENCOUNTER — Ambulatory Visit (INDEPENDENT_AMBULATORY_CARE_PROVIDER_SITE_OTHER): Payer: Medicare Other | Admitting: Sports Medicine

## 2019-05-21 ENCOUNTER — Encounter: Payer: Self-pay | Admitting: Sports Medicine

## 2019-05-21 ENCOUNTER — Other Ambulatory Visit: Payer: Self-pay

## 2019-05-21 DIAGNOSIS — E11610 Type 2 diabetes mellitus with diabetic neuropathic arthropathy: Secondary | ICD-10-CM

## 2019-05-21 DIAGNOSIS — E1142 Type 2 diabetes mellitus with diabetic polyneuropathy: Secondary | ICD-10-CM

## 2019-05-21 DIAGNOSIS — E11621 Type 2 diabetes mellitus with foot ulcer: Secondary | ICD-10-CM

## 2019-05-21 DIAGNOSIS — L97422 Non-pressure chronic ulcer of left heel and midfoot with fat layer exposed: Secondary | ICD-10-CM | POA: Diagnosis not present

## 2019-05-21 NOTE — Progress Notes (Signed)
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 consider ordering this additional test [id number]    HCT 28.1 (L) 39.0 - 52.0 %   MCV 67.7 (L) 80.0 - 100.0 fL   MCH 20.5 (L) 26.0 - 34.0 pg   MCHC 30.2 30.0 - 36.0 g/dL   RDW 17.2 (H) 11.5 - 15.5 %   Platelets 148 (L) 150 - 400 K/uL    Comment: REPEATED TO VERIFY   nRBC 0.0 0.0 - 0.2 %    Comment: Performed at [facility], [place] [place], [place] [address]  Glucose, capillary     Status: Abnormal   Collection Time: 05/09/19  7:51 AM  Result Value Ref Range   Glucose-Capillary 199 (H) 70 - 99 mg/dL   Glucose, capillary     Status: Abnormal   Collection Time: 05/09/19 11:49 AM  Result Value Ref Range   Glucose-Capillary 136 (H) 70 - 99 mg/dL  Glucose, capillary     Status: Abnormal   Collection Time: 05/09/19  4:22 PM  Result Value Ref Range   Glucose-Capillary 143 (H) 70 - 99 mg/dL  POCT glucose (manual entry)     Status: Abnormal   Collection Time: 05/17/19 10:13 AM  Result Value Ref Range   POC Glucose 56 (A) 70 - 99 mg/dl  POCT glucose (manual entry)     Status: Abnormal   Collection Time: 05/17/19 10:36 AM  Result Value Ref Range   POC Glucose 63 (A) 70 - 99 mg/dl  POCT glucose (manual entry)     Status: Abnormal   Collection Time: 05/17/19 11:10 AM  Result Value Ref Range   POC Glucose 61 (A) 70 - 99 mg/dl  Glucose, random     Status: Abnormal   Collection Time: 05/17/19 11:10 AM  Result Value Ref Range   Glucose 53 (L) 65 - 99 mg/dL  CBC     Status: Abnormal   Collection Time: 05/17/19 11:12 AM  Result Value Ref Range   WBC 3.6 3.4 - 10.8 x10E3/uL   RBC 5.04 4.14 - 5.80 x10E6/uL   Hemoglobin 9.9 (L) 13.0 - 17.7 g/dL   Hematocrit 33.4 (L) 37.5 - 51.0 %   MCV 66 (L) 79 - 97 fL   MCH 19.6 (L) 26.6 - 33.0 pg   MCHC 29.6 (L) 31.5 - 35.7 g/dL   RDW 18.7 (H) 11.6 - 15.4 %   Platelets 156 150 - 450 x10E3/uL  Comprehensive metabolic panel     Status: Abnormal   Collection Time: 05/17/19 11:12 AM  Result Value Ref Range   Glucose 50 (L) 65 - 99 mg/dL   BUN 14 8 - 27 mg/dL   Creatinine, Ser 1.66 (H) 0.76 - 1.27 mg/dL   GFR calc non Af Amer 41 (L) >59 mL/min/1.73   GFR calc Af Amer 48 (L) >59 mL/min/1.73   BUN/Creatinine Ratio 8 (L) 10 - 24   Sodium 142 134 - 144 mmol/L   Potassium 4.8 3.5 - 5.2 mmol/L   Chloride 106 96 - 106 mmol/L   CO2 24 20 - 29 mmol/L   Calcium 9.3 8.6 - 10.2 mg/dL   Total  Protein 6.4 6.0 - 8.5 g/dL   Albumin 3.6 (L) 3.8 - 4.8 g/dL   Globulin, Total 2.8 1.5 - 4.5 g/dL   Albumin/Globulin Ratio 1.3 1.2 - 2.2   Bilirubin Total 0.8 0.0 - 1.2  mg/dL   Alkaline Phosphatase 84 39 - 117 IU/L   AST 30 0 - 40 IU/L   ALT 31 0 - 44 IU/L  POCT glucose (manual entry)     Status: Abnormal   Collection Time: 05/17/19 11:43 AM  Result Value Ref Range   POC Glucose 65 (A) 70 - 99 mg/dl  CBC with Differential     Status: Abnormal   Collection Time: 05/17/19  1:04 PM  Result Value Ref Range   WBC 3.6 (L) 4.0 - 10.5 K/uL   RBC 4.99 4.22 - 5.81 MIL/uL   Hemoglobin 10.5 (L) 13.0 - 17.0 g/dL   HCT 34.0 (L) 39.0 - 52.0 %   MCV 68.1 (L) 80.0 - 100.0 fL   MCH 21.0 (L) 26.0 - 34.0 pg   MCHC 30.9 30.0 - 36.0 g/dL   RDW 17.9 (H) 11.5 - 15.5 %   Platelets 136 (L) 150 - 400 K/uL   nRBC 0.0 0.0 - 0.2 %   Neutrophils Relative % 62 %   Neutro Abs 2.3 1.7 - 7.7 K/uL   Lymphocytes Relative 21 %   Lymphs Abs 0.7 0.7 - 4.0 K/uL   Monocytes Relative 13 %   Monocytes Absolute 0.5 0.1 - 1.0 K/uL   Eosinophils Relative 3 %   Eosinophils Absolute 0.1 0.0 - 0.5 K/uL   Basophils Relative 1 %   Basophils Absolute 0.0 0.0 - 0.1 K/uL   Immature Granulocytes 0 %   Abs Immature Granulocytes 0.01 0.00 - 0.07 K/uL    Comment: Performed at [facility], [address] [place], [place] [address]  Basic metabolic panel     Status: Abnormal   Collection Time: 05/17/19  1:04 PM  Result Value Ref Range   Sodium 139 135 - 145 mmol/L   Potassium 4.1 3.5 - 5.1 mmol/L   Chloride 106 98 - 111 mmol/L   CO2 25 22 - 32 mmol/L   Glucose, Bld 78 70 - 99 mg/dL   BUN 12 8 - 23 mg/dL   Creatinine, Ser 1.55 (H) 0.61 - 1.24 mg/dL   Calcium 9.2 8.9 - 10.3 mg/dL   GFR calc non Af Amer 45 (L) >60 mL/min   GFR calc Af Amer 52 (L) >60 mL/min   Anion gap 8 5 - 15    Comment: Performed at [facility] [address], [place], [place] [address]    Objective: There were no vitals filed for this visit.  General: Patient is awake, alert, oriented x 3 and in no acute distress.  Dermatology: Skin is warm and dry bilateral with a healed area of previous ulceration  plantar left lateral midfoot. There is no malodor, no active drainage, no erythema, no edema. No acute signs of infection.   Vascular: Dorsalis Pedis pulse = 1/4 Bilateral,  Posterior Tibial pulse = 1/4 Bilateral,  Capillary Fill Time < 5 seconds  Neurologic: Protective sensation diminished bilateral.  Musculosketal: There is Charcot deformity noted on left foot.  No pain to previous ulceration area is now healed plantar left lateral midfoot.   dNo results for input(s): GRAMSTAIN, LABORGA in the last 8760 hours.  Assessment and Plan:  Problem List Items Addressed This Visit      Endocrine  Diabetic foot ulcer ([place]) - Primary   Charcot foot due to diabetes mellitus ([place])    Other Visit Diagnoses    Diabetic peripheral neuropathy associated with type 2 diabetes mellitus ([place])         -Examined patient and discussed the progression of the wound and treatment alternatives. -Xrays reviewed -Previous ulceration well-healed on left foot -Applied antibiotic cream and clean sock for protection -Continue with postop shoe with Pegasys Plastizote offloading insole -Continue with vascular follow-up with plain angiogram on 10/15 -Continue with no work until after next visit -Social work request made to assist patient with living status - Advised patient to go to the ER or return to office if the wound worsens or if constitutional symptoms are present. -Patient to return to office in 2 weeks after vascular angiogram for follow up care and evaluation or sooner if problems arise.  We will x-ray at next visit to reevaluate his Charcot.  Landis Martins, DPM

## 2019-05-22 ENCOUNTER — Telehealth: Payer: Self-pay | Admitting: *Deleted

## 2019-05-22 NOTE — Telephone Encounter (Signed)
[facility] transferred to ED Social worker and I left pt information.

## 2019-05-22 NOTE — Telephone Encounter (Addendum)
[facility] transferred to Social Iverson Alamin states I should speak with ED Social Worker - Rueben Bash because pt was seen in ED 05/17/2019, Sydell Axon dropped the call when transferring.

## 2019-05-22 NOTE — Telephone Encounter (Signed)
[facility] transferred to [facility] and she transferred to ED Social Worker voicemail, I left message with my contact information, pt's name, MRN, DOB and reason for call.

## 2019-05-22 NOTE — Telephone Encounter (Signed)
-----   Message from Landis Martins, [place] sent at 05/21/2019  8:17 PM EDT ----- Regarding: Social Work Assistance Patient recently displaced from his home due to separation from wife.  Patient is currently living with a friend and does not have a stable place to stay can we please contact social worker at the hospital to see how they can assist this patient on an outpatient basis with potentially finding placement patient was recently discharged  10/2 but reports that he was told he would get a social work phone call to help but has not heard anything up until this point.

## 2019-05-23 ENCOUNTER — Telehealth: Payer: Self-pay | Admitting: Family Medicine

## 2019-05-23 ENCOUNTER — Other Ambulatory Visit: Payer: Self-pay | Admitting: Podiatry

## 2019-05-23 DIAGNOSIS — E11621 Type 2 diabetes mellitus with foot ulcer: Secondary | ICD-10-CM

## 2019-05-23 DIAGNOSIS — L97422 Non-pressure chronic ulcer of left heel and midfoot with fat layer exposed: Secondary | ICD-10-CM

## 2019-05-23 NOTE — Telephone Encounter (Signed)
I called pt and informed of [facility] [phone].

## 2019-05-23 NOTE — Telephone Encounter (Signed)
Pt is wanting to follow up on social worker that was discussed on last visit . If you could call him with information please at [phone]

## 2019-05-29 NOTE — Telephone Encounter (Signed)
Hi Dr. Pamella Pert,   Mr. Garcilazo's referral to [facility] was denied.  See the referral notes for the full explanation.  Do you know of any other options to find him some help?   Thank you,  Wilfred Curtis

## 2019-05-30 ENCOUNTER — Encounter (HOSPITAL_COMMUNITY)
Admission: RE | Disposition: A | Discharge status: Home / Self Care | Payer: Self-pay | Source: Home / Self Care | Attending: Vascular Surgery

## 2019-05-30 ENCOUNTER — Other Ambulatory Visit: Payer: Self-pay

## 2019-05-30 ENCOUNTER — Observation Stay (HOSPITAL_COMMUNITY)
Admission: RE | Admit: 2019-05-30 | Discharge: 2019-05-31 | Disposition: A | Discharge status: Home / Self Care | Payer: Medicare Other | Attending: Vascular Surgery | Admitting: Vascular Surgery

## 2019-05-30 DIAGNOSIS — Z79899 Other long term (current) drug therapy: Secondary | ICD-10-CM | POA: Diagnosis not present

## 2019-05-30 DIAGNOSIS — Z888 Allergy status to other drugs, medicaments and biological substances status: Secondary | ICD-10-CM | POA: Diagnosis not present

## 2019-05-30 DIAGNOSIS — E1151 Type 2 diabetes mellitus with diabetic peripheral angiopathy without gangrene: Secondary | ICD-10-CM | POA: Diagnosis not present

## 2019-05-30 DIAGNOSIS — E1122 Type 2 diabetes mellitus with diabetic chronic kidney disease: Secondary | ICD-10-CM | POA: Diagnosis not present

## 2019-05-30 DIAGNOSIS — E11319 Type 2 diabetes mellitus with unspecified diabetic retinopathy without macular edema: Secondary | ICD-10-CM | POA: Diagnosis not present

## 2019-05-30 DIAGNOSIS — L97529 Non-pressure chronic ulcer of other part of left foot with unspecified severity: Secondary | ICD-10-CM | POA: Insufficient documentation

## 2019-05-30 DIAGNOSIS — N183 Chronic kidney disease, stage 3 unspecified: Secondary | ICD-10-CM | POA: Diagnosis not present

## 2019-05-30 DIAGNOSIS — I70245 Atherosclerosis of native arteries of left leg with ulceration of other part of foot: Secondary | ICD-10-CM | POA: Diagnosis not present

## 2019-05-30 DIAGNOSIS — Z20828 Contact with and (suspected) exposure to other viral communicable diseases: Secondary | ICD-10-CM | POA: Insufficient documentation

## 2019-05-30 DIAGNOSIS — Z833 Family history of diabetes mellitus: Secondary | ICD-10-CM | POA: Diagnosis not present

## 2019-05-30 DIAGNOSIS — Z87891 Personal history of nicotine dependence: Secondary | ICD-10-CM | POA: Insufficient documentation

## 2019-05-30 DIAGNOSIS — I129 Hypertensive chronic kidney disease with stage 1 through stage 4 chronic kidney disease, or unspecified chronic kidney disease: Secondary | ICD-10-CM | POA: Insufficient documentation

## 2019-05-30 DIAGNOSIS — I739 Peripheral vascular disease, unspecified: Secondary | ICD-10-CM | POA: Diagnosis present

## 2019-05-30 DIAGNOSIS — E11621 Type 2 diabetes mellitus with foot ulcer: Secondary | ICD-10-CM | POA: Diagnosis not present

## 2019-05-30 DIAGNOSIS — I998 Other disorder of circulatory system: Secondary | ICD-10-CM

## 2019-05-30 DIAGNOSIS — K219 Gastro-esophageal reflux disease without esophagitis: Secondary | ICD-10-CM | POA: Insufficient documentation

## 2019-05-30 DIAGNOSIS — Z794 Long term (current) use of insulin: Secondary | ICD-10-CM | POA: Diagnosis not present

## 2019-05-30 DIAGNOSIS — Z9079 Acquired absence of other genital organ(s): Secondary | ICD-10-CM | POA: Diagnosis not present

## 2019-05-30 DIAGNOSIS — E785 Hyperlipidemia, unspecified: Secondary | ICD-10-CM | POA: Insufficient documentation

## 2019-05-30 DIAGNOSIS — Z7982 Long term (current) use of aspirin: Secondary | ICD-10-CM | POA: Insufficient documentation

## 2019-05-30 HISTORY — PX: ABDOMINAL AORTOGRAM W/LOWER EXTREMITY: CATH118223

## 2019-05-30 HISTORY — PX: PERIPHERAL VASCULAR BALLOON ANGIOPLASTY: CATH118281

## 2019-05-30 LAB — POCT I-STAT, CHEM 8
BUN: 19 mg/dL (ref 8–23)
Calcium, Ion: 1.22 mmol/L (ref 1.15–1.40)
Chloride: 101 mmol/L (ref 98–111)
Creatinine, Ser: 1.7 mg/dL — ABNORMAL HIGH (ref 0.61–1.24)
Glucose, Bld: 128 mg/dL — ABNORMAL HIGH (ref 70–99)
HCT: 36 % — ABNORMAL LOW (ref 39.0–52.0)
Hemoglobin: 12.2 g/dL — ABNORMAL LOW (ref 13.0–17.0)
Potassium: 3.9 mmol/L (ref 3.5–5.1)
Sodium: 140 mmol/L (ref 135–145)
TCO2: 29 mmol/L (ref 22–32)

## 2019-05-30 LAB — GLUCOSE, CAPILLARY
Glucose-Capillary: 123 mg/dL — ABNORMAL HIGH (ref 70–99)
Glucose-Capillary: 119 mg/dL — ABNORMAL HIGH (ref 70–99)
Glucose-Capillary: 225 mg/dL — ABNORMAL HIGH (ref 70–99)

## 2019-05-30 LAB — SARS CORONAVIRUS 2 BY RT PCR (HOSPITAL ORDER, PERFORMED IN ~~LOC~~ HOSPITAL LAB): SARS Coronavirus 2: NEGATIVE

## 2019-05-30 SURGERY — ABDOMINAL AORTOGRAM W/LOWER EXTREMITY
Anesthesia: LOCAL | Laterality: Left

## 2019-05-30 MED ORDER — LABETALOL HCL 5 MG/ML IV SOLN: 10.0000 mg | INTRAVENOUS | Status: DC | PRN

## 2019-05-30 MED ORDER — HEPARIN (PORCINE) IN NACL 1000-0.9 UT/500ML-% IV SOLN
INTRAVENOUS | Status: AC
  Filled 2019-05-30: qty 500

## 2019-05-30 MED ORDER — ONDANSETRON HCL 4 MG/2ML IJ SOLN: 4.0000 mg | Freq: Four times a day (QID) | INTRAMUSCULAR | Status: DC | PRN

## 2019-05-30 MED ORDER — HYDRALAZINE HCL 20 MG/ML IJ SOLN: 5.0000 mg | INTRAMUSCULAR | Status: DC | PRN

## 2019-05-30 MED ORDER — OXYCODONE HCL 5 MG PO TABS
5.0000 mg | ORAL_TABLET | ORAL | Status: DC | PRN
  Administered 2019-05-30: 10 mg via ORAL
  Administered 2019-05-30: 5 mg via ORAL
  Administered 2019-05-31: 10 mg via ORAL
  Filled 2019-05-30: qty 2
  Filled 2019-05-30: qty 1
  Filled 2019-05-30: qty 2

## 2019-05-30 MED ORDER — LIDOCAINE HCL (PF) 1 % IJ SOLN
INTRAMUSCULAR | Status: AC
  Filled 2019-05-30: qty 30

## 2019-05-30 MED ORDER — MIDAZOLAM HCL 2 MG/2ML IJ SOLN
INTRAMUSCULAR | Status: DC | PRN
  Administered 2019-05-30: 1 mg via INTRAVENOUS

## 2019-05-30 MED ORDER — SODIUM CHLORIDE 0.9 % IV SOLN: 250.0000 mL | INTRAVENOUS | Status: DC | PRN

## 2019-05-30 MED ORDER — INSULIN ASPART 100 UNIT/ML ~~LOC~~ SOLN
0.0000 [IU] | Freq: Three times a day (TID) | SUBCUTANEOUS | Status: DC
  Administered 2019-05-31: 3 [IU] via SUBCUTANEOUS

## 2019-05-30 MED ORDER — FAMOTIDINE IN NACL 20-0.9 MG/50ML-% IV SOLN
20.0000 mg | Freq: Once | INTRAVENOUS | Status: AC
  Administered 2019-05-30: 20 mg via INTRAVENOUS

## 2019-05-30 MED ORDER — NITROGLYCERIN 1 MG/10 ML FOR IR/CATH LAB
INTRA_ARTERIAL | Status: DC | PRN
  Administered 2019-05-30 (×3): 200 ug

## 2019-05-30 MED ORDER — ACETAMINOPHEN 325 MG PO TABS: 650.0000 mg | ORAL_TABLET | ORAL | Status: DC | PRN

## 2019-05-30 MED ORDER — SODIUM CHLORIDE 0.9 % IV SOLN
INTRAVENOUS | Status: DC
  Administered 2019-05-30: 07:00:00 via INTRAVENOUS

## 2019-05-30 MED ORDER — LIDOCAINE HCL (PF) 1 % IJ SOLN
INTRAMUSCULAR | Status: DC | PRN
  Administered 2019-05-30: 15 mL

## 2019-05-30 MED ORDER — HYDRALAZINE HCL 20 MG/ML IJ SOLN
INTRAMUSCULAR | Status: DC | PRN
  Administered 2019-05-30: 10 mg via INTRAVENOUS

## 2019-05-30 MED ORDER — IODIXANOL 320 MG/ML IV SOLN
INTRAVENOUS | Status: DC | PRN
  Administered 2019-05-30: 11:00:00 40 mL via INTRA_ARTERIAL

## 2019-05-30 MED ORDER — MIDAZOLAM HCL 2 MG/2ML IJ SOLN
INTRAMUSCULAR | Status: AC
  Filled 2019-05-30: qty 2

## 2019-05-30 MED ORDER — FAMOTIDINE IN NACL 20-0.9 MG/50ML-% IV SOLN
INTRAVENOUS | Status: AC
  Filled 2019-05-30: qty 50

## 2019-05-30 MED ORDER — CLOPIDOGREL BISULFATE 75 MG PO TABS
75.0000 mg | ORAL_TABLET | Freq: Every day | ORAL | Status: DC
  Administered 2019-05-31: 75 mg via ORAL
  Filled 2019-05-30: qty 1

## 2019-05-30 MED ORDER — CLOPIDOGREL BISULFATE 300 MG PO TABS
ORAL_TABLET | ORAL | Status: AC
  Filled 2019-05-30: qty 1

## 2019-05-30 MED ORDER — HEPARIN SODIUM (PORCINE) 1000 UNIT/ML IJ SOLN
INTRAMUSCULAR | Status: AC
  Filled 2019-05-30: qty 1

## 2019-05-30 MED ORDER — SODIUM CHLORIDE 0.9 % IV SOLN: INTRAVENOUS | Status: AC

## 2019-05-30 MED ORDER — CLOPIDOGREL BISULFATE 75 MG PO TABS
300.0000 mg | ORAL_TABLET | Freq: Once | ORAL | Status: AC
  Administered 2019-05-30: 300 mg via ORAL

## 2019-05-30 MED ORDER — HEPARIN (PORCINE) IN NACL 1000-0.9 UT/500ML-% IV SOLN
INTRAVENOUS | Status: DC | PRN
  Administered 2019-05-30 (×2): 500 mL

## 2019-05-30 MED ORDER — FENTANYL CITRATE (PF) 100 MCG/2ML IJ SOLN
INTRAMUSCULAR | Status: AC
  Filled 2019-05-30: qty 2

## 2019-05-30 MED ORDER — SODIUM CHLORIDE 0.9% FLUSH: 3.0000 mL | INTRAVENOUS | Status: DC | PRN

## 2019-05-30 MED ORDER — FENTANYL CITRATE (PF) 100 MCG/2ML IJ SOLN
INTRAMUSCULAR | Status: DC | PRN
  Administered 2019-05-30: 25 ug via INTRAVENOUS

## 2019-05-30 MED ORDER — SODIUM CHLORIDE 0.9% FLUSH
3.0000 mL | Freq: Two times a day (BID) | INTRAVENOUS | Status: DC
  Administered 2019-05-30: 3 mL via INTRAVENOUS

## 2019-05-30 MED ORDER — HEPARIN SODIUM (PORCINE) 1000 UNIT/ML IJ SOLN
INTRAMUSCULAR | Status: DC | PRN
  Administered 2019-05-30: 8000 [IU] via INTRAVENOUS

## 2019-05-30 MED ORDER — ATORVASTATIN CALCIUM 10 MG PO TABS
10.0000 mg | ORAL_TABLET | Freq: Every day | ORAL | Status: DC
  Administered 2019-05-30: 10 mg via ORAL
  Filled 2019-05-30: qty 1

## 2019-05-30 MED ORDER — NITROGLYCERIN 1 MG/10 ML FOR IR/CATH LAB
INTRA_ARTERIAL | Status: AC
  Filled 2019-05-30: qty 10

## 2019-05-30 SURGICAL SUPPLY — 24 items
BALLN COYOTE OTW 3X150X150 (BALLOONS) ×3
BALLOON COYOTE OTW 3X150X150 (BALLOONS) ×2 IMPLANT
CATH CXI SUPP 2.6F 150 ANG (CATHETERS) ×3 IMPLANT
CATH OMNI FLUSH 5F 65CM (CATHETERS) ×3 IMPLANT
CATH SOFT-VU 4F 65 STRAIGHT (CATHETERS) ×2 IMPLANT
CATH SOFT-VU STRAIGHT 4F 65CM (CATHETERS) ×1
CLOSURE MYNX CONTROL 6F/7F (Vascular Products) ×3 IMPLANT
FILTER CO2 0.2 MICRON (VASCULAR PRODUCTS) ×18 IMPLANT
GUIDEWIRE ANGLED .035X150CM (WIRE) ×3 IMPLANT
KIT ENCORE 26 ADVANTAGE (KITS) ×3 IMPLANT
KIT MICROPUNCTURE NIT STIFF (SHEATH) ×3 IMPLANT
KIT PV (KITS) ×3 IMPLANT
RESERVOIR CO2 (VASCULAR PRODUCTS) ×3 IMPLANT
SET FLUSH CO2 (MISCELLANEOUS) ×3 IMPLANT
SHEATH PINNACLE 5F 10CM (SHEATH) ×3 IMPLANT
SHEATH PINNACLE 6F 10CM (SHEATH) ×3 IMPLANT
SHEATH PINNACLE ST 6F 65CM (SHEATH) ×3 IMPLANT
SHEATH PROBE COVER 6X72 (BAG) ×3 IMPLANT
TRANSDUCER W/STOPCOCK (MISCELLANEOUS) ×3 IMPLANT
TRAY PV CATH (CUSTOM PROCEDURE TRAY) ×3 IMPLANT
WIRE BENTSON .035X145CM (WIRE) ×3 IMPLANT
WIRE G V18X300CM (WIRE) ×3 IMPLANT
WIRE ROSEN-J .035X260CM (WIRE) ×3 IMPLANT
WIRE SPARTACORE .014X300CM (WIRE) ×3 IMPLANT

## 2019-05-30 NOTE — H&P (Signed)
History and Physical Interval Note:  05/30/2019 9:23 AM  [name]  has presented today for surgery, with the diagnosis of pvd ulcer.  The various methods of treatment have been discussed with the patient and family. After consideration of risks, benefits and other options for treatment, the patient has consented to  Procedure(s): ABDOMINAL AORTOGRAM W/LOWER EXTREMITY (N/A) as a surgical intervention.  The patient's history has been reviewed, patient examined, no change in status, stable for surgery.  I have reviewed the patient's chart and labs.  Questions were answered to the patient's satisfaction.    Aortogram, lower extremity arteriogram, possible intervention.  Left lower extremity tissue loss.   J   Patient name: [name]      MRN: [id number]        DOB: 02/12/1951          Sex: male  REASON FOR CONSULT: Left lower extremity diabetic ulcer  HPI: [name] is a 67 y.o. male, with history of diabetes, hypertension, hyperlipidemia, stage III chronic kidney disease that presents for evaluation of diabetic ulcer of the left midfoot.  Patient states he has had at ulcer on his left foot since at least February.  He is been followed by Dr. Duda for some time and has been trying to do wound care and has also been wearing compression socks for venous insufficiency of his lower extremities as well.  He states recently he elected to see a podiatrist and is now having this foot wound managed by podiatry.  He states this was recently debrided.  It has not completely healed since February.  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  He is on his feet for long periods of time.  Now wearing an offloading shoe on his left foot.  States no previous lower extremity interventions.  States he does not smoke.      Past Medical History:  Diagnosis Date  . Cellulitis and abscess of face 10/11/2007   Qualifier: Diagnosis of  By: [place] MD, Elizabeth    .  Diabetic retinopathy (HCC)   . Diabetic retinopathy associated with type 2 diabetes mellitus (HCC) 09/13/2014   He works for [employer]  . Fluid overload 09/03/2012   Post op  . GERD (gastroesophageal reflux disease)   . Visual impairment          Past Surgical History:  Procedure Laterality Date  . CATARACT EXTRACTION W/ INTRAOCULAR LENS  IMPLANT, BILATERAL    . EYE SURGERY Bilateral    "laser OR for diabetic retinopathy"  . INGUINAL HERNIA REPAIR     /notes 07/12/2010), "don't remember which side"  . LAPAROTOMY  08/23/2012   Procedure: EXPLORATORY LAPAROTOMY;  Surgeon: Brian Layton, DO;  Location: [facility];  Service: General;  Laterality: N/A;  exploratory laparotomy with lysis of adhesions  . LYSIS OF ADHESION  08/23/2012   Procedure: LYSIS OF ADHESION;  Surgeon: Brian Layton, DO;  Location: [facility];  Service: General;;  . ROBOT ASSISTED LAPAROSCOPIC RADICAL PROSTATECTOMY  2000's   "had to finish manually after machine broke"  . SHOULDER SURGERY  1970's   separation; from playing football"         Family History  Problem Relation Age of Onset  . Diabetes Mellitus II Mother   . Colon cancer Neg Hx     SOCIAL HISTORY: Social History        Socioeconomic History  . Marital status: Married    Spouse   name: Not on file  . Number of children: Not on file  . Years of education: Not on file  . Highest education level: Not on file  Occupational History  . Not on file  Social Needs  . Financial resource strain: Not on file  . Food insecurity    Worry: Not on file    Inability: Not on file  . Transportation needs    Medical: Not on file    Non-medical: Not on file  Tobacco Use  . Smoking status: Former Smoker    Packs/day: 2.50    Years: 3.00    Pack years: 7.50    Types: Cigarettes    Quit date: 11/09/1966    Years since quitting: 52.5  . Smokeless tobacco: Never Used  Substance and Sexual Activity  .  Alcohol use: Yes    Comment: 04/08/2015 "maybe a beer/ or 2 or a glass of wine monthly"  . Drug use: No  . Sexual activity: Yes  Lifestyle  . Physical activity    Days per week: Not on file    Minutes per session: Not on file  . Stress: Not on file  Relationships  . Social Herbalist on phone: Not on file    Gets together: Not on file    Attends religious service: Not on file    Active member of club or organization: Not on file    Attends meetings of clubs or organizations: Not on file    Relationship status: Not on file  . Intimate partner violence    Fear of current or ex partner: Not on file    Emotionally abused: Not on file    Physically abused: Not on file    Forced sexual activity: Not on file  Other Topics Concern  . Not on file  Social History Narrative  . Not on file         Allergies  Allergen Reactions  . Ace Inhibitors Itching and Cough  . Naproxen Anaphylaxis, Shortness Of Breath and Other (See Comments)    Throat swells. cannot breathe, and causes GI distress          Current Outpatient Medications  Medication Sig Dispense Refill  . acetaminophen (TYLENOL) 650 MG CR tablet Take 650 mg by mouth every 8 (eight) hours as needed for pain.    [name] albuterol (VENTOLIN HFA) 108 (90 Base) MCG/ACT inhaler Inhale 2 puffs into the lungs every 4 (four) hours as needed for wheezing or shortness of breath. 6.7 g 0  . amLODipine (NORVASC) 5 MG tablet Take 1 tablet (5 mg total) by mouth daily. 30 tablet 0  . aspirin 81 MG chewable tablet Chew 81 mg by mouth every morning.     [name] atorvastatin (LIPITOR) 20 MG tablet Take 20 mg by mouth every morning.     . blood glucose meter kit and supplies KIT Dispense based on patient and insurance preference. Use up to four times daily as directed. (FOR ICD-9 250.00, 250.01). 1 each 1  . clindamycin (CLEOCIN) 300 MG capsule Take 1 capsule (300 mg total) by mouth 2 (two) times daily for 7 days.  14 capsule 0  . furosemide (LASIX) 40 MG tablet Take 1 tablet (40 mg total) by mouth daily. 30 tablet 0  . gabapentin (NEURONTIN) 300 MG capsule TAKE 1 CAPSULE(300 MG) BY MOUTH THREE TIMES DAILY (Patient taking differently: Take 300 mg by mouth 3 (three) times daily. ) 90 capsule 0  . Insulin Isophane &  Regular Human (HUMULIN 70/30 MIX) (70-30) 100 UNIT/ML PEN Inject 16-20 Units into the skin daily before breakfast AND 16-20 Units daily before supper. 15 mL   . Insulin Pen Needle (PEN NEEDLES 31GX5/16") 31G X 8 MM MISC Use new needle with each insulin injection, twice a day. Dx E11.65, Z79.4 100 each 5  . Lancets (FREESTYLE) lancets Use as instructed 100 each 0  . Lancets 30G MISC     . Multiple Vitamin (MULTIVITAMIN WITH MINERALS) TABS tablet Take 1 tablet by mouth every morning. Centrum    . mupirocin ointment (BACTROBAN) 2 % Apply to left foot once daily 22 g 0  . ondansetron (ZOFRAN) 4 MG tablet Take 1 tablet (4 mg total) by mouth daily as needed for nausea or vomiting. 20 tablet 0  . Polyethyl Glycol-Propyl Glycol (SYSTANE OP) Place 1 drop into both eyes daily as needed (dry eyes).    . polyethylene glycol (MIRALAX / GLYCOLAX) 17 g packet Take 17 g by mouth daily as needed for moderate constipation. 14 each 0  . senna (SENOKOT) 8.6 MG TABS tablet Take 1 tablet (8.6 mg total) by mouth 2 (two) times daily. 30 tablet 0  . traMADol (ULTRAM) 50 MG tablet Take 1 tablet (50 mg total) by mouth every 6 (six) hours as needed for moderate pain (pain). 20 tablet 0   No current facility-administered medications for this visit.     REVIEW OF SYSTEMS:  [X] denotes positive finding, [ ] denotes negative finding Cardiac  Comments:  Chest pain or chest pressure:    Shortness of breath upon exertion:    Short of breath when lying flat:    Irregular heart rhythm:        Vascular    Pain in calf, thigh, or hip brought on by ambulation:    Pain in feet at night that wakes you up  from your sleep:     Blood clot in your veins:    Leg swelling:         Pulmonary    Oxygen at home:    Productive cough:     Wheezing:         Neurologic    Sudden weakness in arms or legs:     Sudden numbness in arms or legs:     Sudden onset of difficulty speaking or slurred speech:    Temporary loss of vision in one eye:     Problems with dizziness:         Gastrointestinal    Blood in stool:     Vomited blood:         Genitourinary    Burning when urinating:     Blood in urine:        Psychiatric    Major depression:         Hematologic    Bleeding problems:    Problems with blood clotting too easily:        Skin    Rashes or ulcers:        Constitutional    Fever or chills:      PHYSICAL EXAM:    Vitals:   05/14/19 1412  BP: 134/78  Pulse: 78  Resp: 14  Temp: 97.8 F (36.6 C)  TempSrc: Temporal  SpO2: 98%  Weight: 179 lb (81.2 kg)  Height: 5' 6" (1.676 m)    GENERAL: The patient is a well-nourished male, in no acute distress. The vital signs are documented above. CARDIAC: There is a   regular rate and rhythm.  VASCULAR:  Palpable femoral pulses bilateral groins Right PT weakly palpable No palpable left pedal pulses Diabetic ulcer on plantar surface of left foot PULMONARY: There is good air exchange bilaterally without wheezing or rales. ABDOMEN: Soft and non-tender with normal pitched bowel sounds.  MUSCULOSKELETAL: There are no major deformities or cyanosis. NEUROLOGIC: No focal weakness or paresthesias are detected. SKIN: There are no ulcers or rashes noted. PSYCHIATRIC: The patient has a normal affect.  DATA:   ABIs unreliable.  Abnormal waveforms at both ankles.  On the left which is the side of interest monophasic waveforms.  Reduced toe pressure 42.  Assessment/Plan:  68 year old male that presents with nonhealing diabetic ulcer of his left lower  extremity and that has been ongoing since February.  On exam he has no appreciable palpable pulses on the left foot.  He has monophasic signals.  In review of his ABIs from October 22, 2018 monophasic waveforms at the ankle, ABIs are likely non-compressible, and he has severely reduced toe pressure.  I discussed with him that I think he likely has severe underlying PAD and infrainguinal disease as a contributing factor for failure of his wound to heal.  I have recommended lower extremity arteriogram and intervention.  Risks and benefits were discussed.  He had a left lower extremity reflux study today and has evidence of both deep and superficial reflux.  He has deep reflux in the common femoral and popliteal vein.  Superficial reflux throughout the great saphenous and small saphenous vein.  Discussed that after arterial intervention he would need to go back in compression to help treat his venous disease.  Marty Heck, MD [facility] Office: [phone] Pager: [place]

## 2019-05-30 NOTE — Progress Notes (Signed)
Patient arrived from Cath lab, vital signs obtained rt groin level 0 patient placed on monitor and CHG done. Will monitor patient. Jrue Jarriel, Bettina Gavia RN

## 2019-05-30 NOTE — Progress Notes (Signed)
Pt states he does not have an one to ride home with him. Rennis Harding, Rn called and informed.

## 2019-05-30 NOTE — Op Note (Signed)
Patient name: [name] MRN: [id number] DOB: 21-Jul-1951 Sex: male  05/30/2019 Pre-operative Diagnosis: Critical limb ischemia of the left lower extremity with tissue loss Post-operative diagnosis:  Same Surgeon:  Marty Heck, MD Procedure Performed: 1.  Ultrasound-guided access of the right common femoral artery 2.  CO2 aortogram 3.  Left lower extremity arteriogram with CO2 including selection of third order branches 4.  Left anterior tibial artery angioplasty (3.0 mm x 150 mm Coyote) 5.  Mynx closure of the right common femoral artery 6.  70 minutes of monitored moderate conscious sedation time  Indications: Patient is a 68 year old male who presented with nonhealing left foot wound since February of this year.  He had largely noncompressible ABIs with monophasic waveform at the ankle.  He presents today for aortogram left lower extremity arteriogram and possible intervention.  Plan is to use CO2 given severe underlying CKD.  Risk benefits were discussed with the patient.  Findings:   CO2 aortogram showed no flow-limiting aortoiliac stenosis.  Ultimately had a very angulated and steep aortic bifurcation.  Left lower extremity arteriogram with CO2 showed a patent common femoral profunda and SFA.  He did have some mild disease in the popliteal artery but this did not appear flow-limiting.  Dominant runoff in the left lower extremity was via the posterior tibial that was in line with really no significant disease.  Peroneal was also patent with a 70 to 80% stenosis at the ostium.  Anterior tibial appeared to have multifocal 80 to 95% stenosis throughout an approximate 20 cm segment but the artery was very robust distally.  Ultimately the anterior tibial was selected with a CXI catheter, wire down the anterior tibial into the dorsalis pedis.  The anterior tibial was treated with a 3 mm coyote.  Patient now has brisk filling of the anterior tibial with brisk flow into the  dorsalis pedis.  All other runoff preserved.     Procedure:  The patient was identified in the holding area and taken to room 8.  The patient was then placed supine on the table and prepped and draped in the usual sterile fashion.  A time out was called.  Ultrasound was used to evaluate the right common femoral artery.  It was patent .  A digital ultrasound image was acquired.  A micropuncture needle was used to access the right common femoral artery under ultrasound guidance.  An 018 wire was advanced without resistance and a micropuncture sheath was placed.  The 018 wire was removed and a benson wire was placed.  The micropuncture sheath was exchanged for a 5 french sheath.  An omniflush catheter was advanced over the wire to the level of L-1.  An abdominal angiogram was obtained with CO2.  I had trouble getting Omni Flush catheter advanced over the aortic bifurcation given how steep it was.  Ultimately used a soft angled Glidewire with the Omni Flush catheter and then ultimately changed for a straight 4 French flush catheter.  Left lower extremity runoff was then obtained with CO2 after parking the catheter in the distal left external iliac.  I could not evaluate the tibials clearly so I then advanced a Glidewire into the SFA and finally got my catheter to advance into the SFA.  Dedicated contrast shots were obtained of the left tibial arteries.  Ultimately made the decision intervene on the left anterior tibial with findings noted above.  Placed a Rosen wire down the 4 French straight flush catheter that was  in the left SFA.  Exchanged for high flex 6 French sheath in the right common femoral artery over the aortic bifurcation down to the left SFA.  Patient was given 100 units per kilogram heparin.  Then used a CXI catheter with a V18 wire and ultimately cannulated the anterior tibial.  Exchanged for for a 0.014 Spartacore wire that was advanced all the way down the anterior tibial into the dorsalis pedis.   Nitroglycerin was given throughout the case at least 600 mcg.  I then ballooned the anterior tibial throughout diseased segments with a 3 mm x 150 mm coyote to nominal pressure for 2 minutes in each location.  Hand injections through the sheath showed excellent flow down the posterior tibial and anterior tibial with no residual stenosis.  The peroneal remains patent and did not intervene on the proximal peroneal stenosis given I did not want interrupted flow down the posterior tibial with risk of dissection.  At that point time wires and catheters were removed.  Used a Astronomer exchanged for short 6 French sheath in the right groin.  Mynx closure device was deployed.  He will be taken to recovery in stable condition.    Marty Heck, MD [facility] [place] Office: [phone] Pager: [place]

## 2019-05-31 ENCOUNTER — Other Ambulatory Visit: Payer: Self-pay

## 2019-05-31 ENCOUNTER — Ambulatory Visit: Payer: Medicare Other | Admitting: Family Medicine

## 2019-05-31 ENCOUNTER — Encounter (HOSPITAL_COMMUNITY): Payer: Self-pay | Admitting: Vascular Surgery

## 2019-05-31 DIAGNOSIS — E1151 Type 2 diabetes mellitus with diabetic peripheral angiopathy without gangrene: Secondary | ICD-10-CM | POA: Diagnosis not present

## 2019-05-31 LAB — GLUCOSE, CAPILLARY
Glucose-Capillary: 204 mg/dL — ABNORMAL HIGH (ref 70–99)
Glucose-Capillary: 194 mg/dL — ABNORMAL HIGH (ref 70–99)
Glucose-Capillary: 129 mg/dL — ABNORMAL HIGH (ref 70–99)

## 2019-05-31 MED ORDER — CLOPIDOGREL BISULFATE 75 MG PO TABS: 75.0000 mg | ORAL_TABLET | Freq: Every day | ORAL | 3 refills | Status: DC

## 2019-05-31 NOTE — Discharge Instructions (Signed)
° °  [facility] [place] ° °Discharge Instructions ° °Lower Extremity Angiogram; Angioplasty/Stenting ° °Please refer to the following instructions for your post-procedure care. Your surgeon or physician assistant will discuss any changes with you. ° °Activity ° °Avoid lifting more than 8 pounds (1 gallons of milk) for 72 hours (3 days) after your procedure. You may walk as much as you can tolerate. It's OK to drive after 72 hours. ° °Bathing/Showering ° °You may shower the day after your procedure. If you have a bandage, you may remove it at 24- 48 hours. Clean your incision site with mild soap and water. Pat the area dry with a clean towel. ° °Diet ° °Resume your pre-procedure diet. There are no special food restrictions following this procedure. All patients with peripheral vascular disease should follow a low fat/low cholesterol diet. In order to heal from your surgery, it is CRITICAL to get adequate nutrition. Your body requires vitamins, minerals, and protein. Vegetables are the best source of vitamins and minerals. Vegetables also provide the perfect balance of protein. Processed food has little nutritional value, so try to avoid this. ° °Medications ° °Resume taking all of your medications unless your doctor tells you not to. If your incision is causing pain, you may take over-the-counter pain relievers such as acetaminophen (Tylenol) ° °Follow Up ° °Follow up will be arranged at the time of your procedure. You may have an office visit scheduled or may be scheduled for surgery. Ask your surgeon if you have any questions. ° °Please call us immediately for any of the following conditions: °•Severe or worsening pain your legs or feet at rest or with walking. °•Increased pain, redness, drainage at your groin puncture site. °•Fever of 101 degrees or higher. °•If you have any mild or slow bleeding from your puncture site: lie down, apply firm constant pressure over the area with a piece of  gauze or a clean wash cloth for 30 minutes- no peeking!, call 911 right away if you are still bleeding after 30 minutes, or if the bleeding is heavy and unmanageable. ° °Reduce your risk factors of vascular disease: ° °Stop smoking. If you would like help call [facility] at 1-800-QUIT-NOW (1-800-784-8669) or [facility] at 336-586-4000. °Manage your cholesterol °Maintain a desired weight °Control your diabetes °Keep your blood pressure down ° °If you have any questions, please call the office at [phone] ° °

## 2019-05-31 NOTE — Telephone Encounter (Signed)
Can we please give him an update status on our efforts to localize services. thanks

## 2019-05-31 NOTE — Progress Notes (Signed)
Patient in a stable condition discharge education reviewed with patient he verbalized understanding, iv removed, tele dc ccmd notified, patient belongings at bedside, patient awaiting taxi voucher for transportation home.

## 2019-05-31 NOTE — Progress Notes (Addendum)
[facility]  Subjective  - Doing well without new complaints.   Objective (!) 153/75 69 98.1 F (36.7 C) (Oral) 12 99%  Intake/Output Summary (Last 24 hours) at 05/31/2019 0745 Last data filed at 05/30/2019 1700 Gross per 24 hour  Intake 338.45 ml  Output -  Net 338.45 ml    Left doppler signal AT/DP/weker PT/peroneal all intact Left plantar foot Callus/ wound, no drainage Right groin soft without hematoma Lungs non labored breathing  Assessment/Planning: POD # 1  Procedure Performed: 1.  Ultrasound-guided access of the right common femoral artery 2.  CO2 aortogram 3.  Left lower extremity arteriogram with CO2 including selection of third order branches 4.  Left anterior tibial artery angioplasty (3.0 mm x 150 mm Coyote) 5.  Mynx closure of the right common femoral artery 6.  70 minutes of monitored moderate conscious sedation time  Stable for discharge F/U in 4 weeks  Plavix, 81 mg Asprin and statin.  F/U ABI and left arterial duplex.   Roxy Horseman 05/31/2019 7:45 AM --  Laboratory Lab Results: Recent Labs    05/30/19 0656  HGB 12.2*  HCT 36.0*   BMET Recent Labs    05/30/19 0656  NA 140  K 3.9  CL 101  GLUCOSE 128*  BUN 19  CREATININE 1.70*    COAG Lab Results  Component Value Date   INR 1.2 05/06/2019   No results found for: PTT  I have seen and evaluated the patient. I agree with the PA note as documented above. POD#1 s/p left AT angioplasty for tissue loss.  Brisk DP signal today.  Groin looks good.  Plan for d/c home.  Continue plavix.  Will arrange followup in 4 weeks in clinic with vascular studies.   Marty Heck, MD [facility] Office: [phone] Pager: [phone]

## 2019-05-31 NOTE — Progress Notes (Signed)
CSW received consult for transportation and housing resources. CSW visit with the patient at bedside. CSW introduced self and explained role. Patient states he is married but currently his wife has a restraining order against him. He is temporarily staying with a friend until he is able to find affordable housing. 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   CSW gave patient affordable senior housing resources, [employer], and [employer] and senior transportation resources.   CSW provided RN with patient's cab voucher  [name], MSW, [place] Social Worker [phone]

## 2019-05-31 NOTE — Plan of Care (Signed)

## 2019-06-03 ENCOUNTER — Other Ambulatory Visit: Payer: Self-pay

## 2019-06-03 ENCOUNTER — Ambulatory Visit (INDEPENDENT_AMBULATORY_CARE_PROVIDER_SITE_OTHER): Payer: Medicare Other | Admitting: Family Medicine

## 2019-06-03 ENCOUNTER — Encounter: Payer: Self-pay | Admitting: Family Medicine

## 2019-06-03 VITALS — BP 102/74 | HR 67 | Temp 98.7°F | Ht 66.0 in | Wt 176.0 lb

## 2019-06-03 DIAGNOSIS — Z597 Insufficient social insurance and welfare support: Secondary | ICD-10-CM | POA: Diagnosis not present

## 2019-06-03 DIAGNOSIS — E08621 Diabetes mellitus due to underlying condition with foot ulcer: Secondary | ICD-10-CM | POA: Diagnosis not present

## 2019-06-03 DIAGNOSIS — L97421 Non-pressure chronic ulcer of left heel and midfoot limited to breakdown of skin: Secondary | ICD-10-CM

## 2019-06-03 DIAGNOSIS — I739 Peripheral vascular disease, unspecified: Secondary | ICD-10-CM | POA: Diagnosis not present

## 2019-06-03 DIAGNOSIS — I10 Essential (primary) hypertension: Secondary | ICD-10-CM | POA: Diagnosis not present

## 2019-06-03 NOTE — Discharge Summary (Signed)
[facility] Discharge Summary   Patient ID:  [name] MRN: [id number] DOB/AGE: 1951/01/30 68 y.o.  Admit date: 05/30/2019 Discharge date: 05/31/2019 Date of Surgery: 05/30/2019 Surgeon: Surgeon(s): Marty Heck, MD  Admission Diagnosis: PAD (peripheral artery disease) [facility]) [I73.9]  Discharge Diagnoses:  PAD (peripheral artery disease) ([place]) [I73.9]  Secondary Diagnoses: Past Medical History:  Diagnosis Date  . Cellulitis and abscess of face 10/11/2007   Qualifier: Diagnosis of  By: Amil Amen MD, Benjamine Mola    . Diabetic retinopathy ([place])   . Diabetic retinopathy associated with type 2 diabetes mellitus ([place]) 09/13/2014   He works for [employer]  . Fluid overload 09/03/2012   Post op  . GERD (gastroesophageal reflux disease)   . Visual impairment     Procedure(s): ABDOMINAL AORTOGRAM W/LOWER EXTREMITY PERIPHERAL VASCULAR BALLOON ANGIOPLASTY  Discharged Condition: stable  HPI: [name] Pare Hintonis a 68 y.o.male,with history of diabetes, hypertension,hyperlipidemia,stage III chronic kidney disease thatpresents for evaluation of diabetic ulcer of the left midfoot. Patient states he has hadatulcer on his left foot since at least February. He is been followed by Dr. [name] for some time and has been trying to do wound care and has also been wearing compression socks for venous insufficiency of his lower extremities as well. He states recently he elected to see a podiatrist and is now having this foot wound managed by podiatry. He states this was recently debrided. It has not completely healed since February. 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    He had abnormal ABI's and now is scheduled for angiogram with possible intervention.   Hospital Course:  [name] is a 68 y.o. male is S/P  Procedure(s): ABDOMINAL AORTOGRAM W/LOWER EXTREMITY PERIPHERAL VASCULAR BALLOON ANGIOPLASTY  CO2 aortogram  showed no flow-limiting aortoiliac stenosis.  Ultimately had a very angulated and steep aortic bifurcation.  Left lower extremity arteriogram with CO2 showed a patent common femoral profunda and SFA.  He did have some mild disease in the popliteal artery but this did not appear flow-limiting.  Dominant runoff in the left lower extremity was via the posterior tibial that was in line with really no significant disease.  Peroneal was also patent with a 70 to 80% stenosis at the ostium.  Anterior tibial appeared to have multifocal 80 to 95% stenosis throughout an approximate 20 cm segment but the artery was very robust distally.  Ultimately the anterior tibial was selected with a CXI catheter, wire down the anterior tibial into the dorsalis pedis.  The anterior tibial was treated with a 3 mm coyote.  Patient now has brisk filling of the anterior tibial with brisk flow into the dorsalis pedis.  All other runoff preserved.     Post op Left LE doppler signal AT/DP/weker PT/peroneal all intact    Stable for discharge F/U in 4 weeks  Plavix, 81 mg Asprin and statin.  F/U ABI and left arterial duplex.  Significant Diagnostic Studies: CBC Lab Results  Component Value Date   WBC 3.6 (L) 05/17/2019   HGB 12.2 (L) 05/30/2019   HCT 36.0 (L) 05/30/2019   MCV 68.1 (L) 05/17/2019   PLT 136 (L) 05/17/2019    BMET    Component Value Date/Time   NA 140 05/30/2019 0656   NA 142 05/17/2019 1112   K 3.9 05/30/2019 0656   CL 101 05/30/2019 0656   CO2 25 05/17/2019 1304   GLUCOSE 128 (H) 05/30/2019 0656   BUN 19  05/30/2019 0656   BUN 14 05/17/2019 1112   CREATININE 1.70 (H) 05/30/2019 0656   CALCIUM 9.2 05/17/2019 1304   GFRNONAA 45 (L) 05/17/2019 1304   GFRAA 52 (L) 05/17/2019 1304   COAG Lab Results  Component Value Date   INR 1.2 05/06/2019     Disposition:  Discharge to :Home Discharge Instructions    Call MD for:  redness, tenderness, or signs of infection (pain, swelling, bleeding,  redness, odor or green/yellow discharge around incision site)   Complete by: As directed    Call MD for:  severe or increased pain, loss or decreased feeling  in affected limb(s)   Complete by: As directed    Call MD for:  temperature >100.5   Complete by: As directed    Resume previous diet   Complete by: As directed      Allergies as of 05/31/2019      Reactions   Ace Inhibitors Itching, Cough   Naproxen Anaphylaxis, Shortness Of Breath, Other (See Comments)   Throat swells. cannot breathe, and causes GI distress      Medication List    STOP taking these medications   albuterol 108 (90 Base) MCG/ACT inhaler Commonly known as: VENTOLIN HFA   amLODipine 5 MG tablet Commonly known as: NORVASC   clindamycin 300 MG capsule Commonly known as: CLEOCIN     TAKE these medications   acetaminophen 650 MG CR tablet Commonly known as: TYLENOL Take 650 mg by mouth every 8 (eight) hours as needed for pain.   aspirin 81 MG chewable tablet Chew 81 mg by mouth every morning.   atorvastatin 20 MG tablet Commonly known as: LIPITOR Take 20 mg by mouth every morning.   blood glucose meter kit and supplies Kit Dispense based on patient and insurance preference. Use up to four times daily as directed. (FOR ICD-9 250.00, 250.01).   clopidogrel 75 MG tablet Commonly known as: PLAVIX Take 1 tablet (75 mg total) by mouth daily with breakfast.   docusate sodium 100 MG capsule Commonly known as: COLACE Take 100 mg by mouth daily as needed for mild constipation.   freestyle lancets Use as instructed   Lancets 30G Misc   furosemide 40 MG tablet Commonly known as: LASIX Take 1 tablet (40 mg total) by mouth daily.   gabapentin 300 MG capsule Commonly known as: NEURONTIN TAKE 1 CAPSULE(300 MG) BY MOUTH THREE TIMES DAILY What changed: See the new instructions.   Insulin Isophane & Regular Human (70-30) 100 UNIT/ML PEN Commonly known as: HUMULIN 70/30 MIX Inject 16-20 Units into the  skin daily before breakfast AND 16-20 Units daily before supper. What changed:   how much to take  how to take this  when to take this  additional instructions   losartan 50 MG tablet Commonly known as: COZAAR Take 50 mg by mouth daily.   multivitamin with minerals Tabs tablet Take 1 tablet by mouth every morning. Centrum   mupirocin ointment 2 % Commonly known as: BACTROBAN Apply to left foot once daily   PEN NEEDLES 31GX5/16" 31G X 8 MM Misc Use new needle with each insulin injection, twice a day. Dx E11.65, Z79.4   SYSTANE OP Place 1 drop into both eyes daily as needed (dry eyes).      Verbal and written Discharge instructions given to the patient. Wound care per Discharge AVS Follow-up Information    Marty Heck, MD Follow up in 4 week(s).   Specialty: Vascular Surgery Why: office will  f/u Contact information: [address] [place] [address] [phone]           Signed: Roxy Horseman 06/03/2019, 1:10 PM

## 2019-06-03 NOTE — Progress Notes (Signed)
10/19/20204:14 PM  [name] [birth year], 68 y.o., male [id number]  Chief Complaint  Patient presents with  . Follow-up    HPI:   Patient is a 68 y.o. male with past medical history significant for HTN, [id number] complications,HLP who presents today forhosp followup  Had LLE aortogram and balloon plasty of anterior tibial on Oct 15 D/c ASA, plavix and statin FU 4 weeks with vasc surg  Left DM foot ulcer managed by podiatry DM managed by endo  SW meet with patient in the hospital Provided list for housing resources Not part of [place] staying with friend  Struggling with paying for meds Will be running out of DM supplies within several days, not sure how he will be paying for more  He does have [employer] transportation thru work  Advertising account planner mailed to his previous home, which he cant get to due to restraining order  DM ulcer and charcot foot, left On hard sole shoe with offloading insole 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 0 0 0 0 0  Number falls in past yr: 0 0 0 0 -  Injury with Fall? 0 0 0 0 -     Allergies  Allergen Reactions  . Ace Inhibitors Itching and Cough  . Naproxen Anaphylaxis, Shortness Of Breath and Other (See Comments)    Throat swells. cannot breathe, and causes GI distress    Prior to Admission medications   Medication Sig Start Date End Date Taking? Authorizing Provider  acetaminophen (TYLENOL) 650 MG CR tablet Take 650 mg by mouth every 8 (eight) hours as needed for pain.   Yes [provider]  aspirin  81 MG chewable tablet Chew 81 mg by mouth every morning.    Yes [provider]  atorvastatin (LIPITOR) 20 MG tablet Take 20 mg by mouth every morning.  09/27/18  Yes [provider]  blood glucose meter kit and supplies KIT Dispense based on patient and insurance preference. Use up to four times daily as directed. (FOR ICD-9 250.00, 250.01). 10/24/18  Yes Domenic Polite, MD  clopidogrel (PLAVIX) 75 MG tablet Take 1 tablet (75 mg total) by mouth daily with breakfast. 05/31/19  Yes Laurence Slate M, PA-C  gabapentin (NEURONTIN) 300 MG capsule TAKE 1 CAPSULE(300 MG) BY MOUTH THREE TIMES DAILY Patient taking differently: Take 300 mg by mouth 3 (three) times daily.  02/12/19  Yes [name], MD  Insulin Pen Needle (PEN NEEDLES 31GX5/16") 31G X 8 MM MISC Use new needle with each insulin injection, twice a day. Dx E11.65, Z79.4 04/19/19  Yes [name], MD  Lancets (FREESTYLE) lancets Use as instructed 09/18/14  Yes Delfina Redwood, MD  Lancets 30G MISC  04/06/16  Yes [provider]  losartan (COZAAR) 50 MG tablet Take 50 mg by mouth daily.   Yes [provider]  Multiple Vitamin (MULTIVITAMIN WITH MINERALS) TABS tablet Take 1 tablet by mouth every morning. Centrum   Yes [provider]  mupirocin ointment (BACTROBAN) 2 % Apply to left foot once daily 05/10/19 05/09/20 Yes [place], Stephani Police, DPM  Polyethyl Glycol-Propyl Glycol (SYSTANE OP) Place 1 drop into both eyes daily as needed (dry eyes).   Yes [provider]  docusate sodium (COLACE) 100 MG capsule Take 100 mg by mouth daily as needed for mild constipation.    [provider]  furosemide (LASIX) 40 MG tablet Take 1 tablet (40 mg total) by mouth daily. Patient not taking: Reported on 06/03/2019 05/13/19   Aline August, MD  Insulin Isophane & Regular Human (HUMULIN 70/30 MIX) (70-30) 100 UNIT/ML PEN Inject 16-20 Units into the skin daily before breakfast AND 16-20 Units daily  before supper. Patient not taking: Reported on 06/03/2019 05/13/19   Philemon Kingdom, MD    Past Medical History:  Diagnosis Date  . Cellulitis and abscess of face 10/11/2007   Qualifier: Diagnosis of  By: Amil Amen MD, Benjamine Mola    . Diabetic retinopathy ([place])   . Diabetic retinopathy associated with type 2 diabetes mellitus ([place]) 09/13/2014   He works for [employer]  . Fluid overload 09/03/2012   Post op  . GERD (gastroesophageal reflux disease)   . Visual impairment     Past Surgical History:  Procedure Laterality Date  . ABDOMINAL AORTOGRAM W/LOWER EXTREMITY Left 05/30/2019   Procedure: ABDOMINAL AORTOGRAM W/LOWER EXTREMITY;  Surgeon: Marty Heck, MD;  Location: [facility];  Service: Cardiovascular;  Laterality: Left;  . CATARACT EXTRACTION W/ INTRAOCULAR LENS  IMPLANT, BILATERAL    . EYE SURGERY Bilateral    "laser OR for diabetic retinopathy"  . INGUINAL HERNIA REPAIR     [name] 07/12/2010), "don't remember which side"  . LAPAROTOMY  08/23/2012   Procedure: EXPLORATORY LAPAROTOMY;  Surgeon: [name], DO;  Location: [facility];  Service: General;  Laterality: N/A;  exploratory laparotomy with lysis of adhesions  . LYSIS OF ADHESION  08/23/2012   Procedure: LYSIS OF ADHESION;  Surgeon: [name], DO;  Location: [facility];  Service: General;;  . PERIPHERAL VASCULAR BALLOON ANGIOPLASTY  05/30/2019   Procedure: PERIPHERAL VASCULAR BALLOON ANGIOPLASTY;  Surgeon: Marty Heck, MD;  Location: [facility];  Service: Cardiovascular;;  left anterior tibial  . ROBOT ASSISTED LAPAROSCOPIC RADICAL PROSTATECTOMY  2000's   "had to finish manually after machine broke"  . SHOULDER SURGERY  1970's   separation; from playing football"    Social History   Tobacco Use  . Smoking status: Former Smoker    Packs/day: 2.50    Years: 3.00    Pack years: 7.50    Types: Cigarettes    Quit date: 11/09/1966    Years since quitting: 52.6  . Smokeless  tobacco: Never Used  Substance Use Topics  . Alcohol use: Yes    Comment: 04/08/2015 "maybe a beer/ or 2 or a glass of wine monthly"    Family History  Problem Relation Age of Onset  . Diabetes Mellitus II Mother   . Colon cancer Neg Hx     Review of Systems  Constitutional: Negative for chills and fever.  Respiratory: Negative for cough and shortness of breath.   Cardiovascular: Negative for chest pain, palpitations and leg swelling.  Gastrointestinal: Negative for abdominal pain, nausea and vomiting.     OBJECTIVE:  Today's Vitals   06/03/19 1606 06/03/19 1620 06/03/19 1639  BP: (!) 181/73 (!) 179/72 102/74  Pulse: 67    Temp: 98.7 F (37.1 C)  SpO2: 98%    Weight: 176 lb (79.8 kg)    Height: _0  (1.676 m)     Body mass index is 28.41 kg/m.   Physical Exam Vitals signs and nursing note reviewed.  Constitutional:      Appearance: He is well-developed.  HENT:     Head: Normocephalic and atraumatic.  Eyes:     Conjunctiva/sclera: Conjunctivae normal.     Pupils: Pupils are equal, round, and reactive to light.  Neck:     Musculoskeletal: Neck supple.  Cardiovascular:     Rate and Rhythm: Normal rate and regular rhythm.     Heart sounds: No murmur. No friction rub. No gallop.   Pulmonary:     Effort: Pulmonary effort is normal.     Breath sounds: Normal breath sounds. No wheezing or rales.  Skin:    General: Skin is warm and dry.  Neurological:     Mental Status: He is alert and oriented to person, place, and time.     No results found for this or any previous visit (from the past 24 hour(s)).  No results found.   ASSESSMENT and PLAN  1. Essential hypertension Controlled. Continue current regime.  - Care order/instruction:  2. PAD (peripheral artery disease) ([place]) S/p intervention. Fu with vas surg as scheduled  3. Diabetic ulcer of left midfoot associated with diabetes mellitus due to underlying condition, limited to breakdown of skin ([place])  Improving, managed by podiatry  4. Insufficient social insurance and welfare support Not a member of [facility], SW at hosp provided list of housing options. Wondering if community and wellness might be more appropriate clinic given resources. Working on gathering more resources, [facility] is also an option given case mgt/mail room.   Return in about 4 weeks (around 07/01/2019).    [name], MD Primary Care at [place], [place] [address] Ph.  [phone] Fax [phone]

## 2019-06-03 NOTE — Patient Instructions (Addendum)
  [place],  [place]  [address] Main: [phone]   If you have lab work done today you will be contacted with your lab results within the next 2 weeks.  If you have not heard from [place] then please contact us. The fastest way to get your results is to register for My Chart.   IF you received an x-ray today, you will receive an invoice from [facility]. Please contact [facility] at [phone] with questions or concerns regarding your invoice.   IF you received labwork today, you will receive an invoice from [facility]. Please contact [facility] at [phone] with questions or concerns regarding your invoice.   Our billing staff will not be able to assist you with questions regarding bills from these companies.  You will be contacted with the lab results as soon as they are available. The fastest way to get your results is to activate your My Chart account. Instructions are located on the last page of this paperwork. If you have not heard from [place] regarding the results in 2 weeks, please contact this office.

## 2019-06-04 ENCOUNTER — Other Ambulatory Visit: Payer: Self-pay

## 2019-06-04 ENCOUNTER — Encounter: Payer: Self-pay | Admitting: Sports Medicine

## 2019-06-04 ENCOUNTER — Ambulatory Visit (INDEPENDENT_AMBULATORY_CARE_PROVIDER_SITE_OTHER): Payer: Medicare Other

## 2019-06-04 ENCOUNTER — Ambulatory Visit (INDEPENDENT_AMBULATORY_CARE_PROVIDER_SITE_OTHER): Payer: Medicare Other | Admitting: Sports Medicine

## 2019-06-04 DIAGNOSIS — E11610 Type 2 diabetes mellitus with diabetic neuropathic arthropathy: Secondary | ICD-10-CM

## 2019-06-04 DIAGNOSIS — E08621 Diabetes mellitus due to underlying condition with foot ulcer: Secondary | ICD-10-CM

## 2019-06-04 DIAGNOSIS — L97401 Non-pressure chronic ulcer of unspecified heel and midfoot limited to breakdown of skin: Secondary | ICD-10-CM

## 2019-06-04 DIAGNOSIS — E1142 Type 2 diabetes mellitus with diabetic polyneuropathy: Secondary | ICD-10-CM | POA: Diagnosis not present

## 2019-06-04 DIAGNOSIS — I739 Peripheral vascular disease, unspecified: Secondary | ICD-10-CM | POA: Diagnosis not present

## 2019-06-04 MED ORDER — MUPIROCIN 2 % EX OINT: TOPICAL_OINTMENT | CUTANEOUS | 0 refills | Status: DC

## 2019-06-04 NOTE — Progress Notes (Signed)
Subjective: [name] is a 68 y.o. male patient seen in office for follow-up evaluation of ulceration of the left foot.  Patient reports that it seems to be healing very well s reports no pain but sometimes there is swelling with occasional pains that are sharp in nature 7 out of 10 but does not last long reports that he has been treating the area with antibiotic cream just to be safe has not noticed any redness increased swelling or drainage.  Last A1c was recorded at 10 and last blood sugar on yesterday was 119. Denies nausea/fever/vomiting/chills/night sweats/shortness of breath/pain. Patient has no other pedal complaints at this time.  Patient Active Problem List   Diagnosis Date Noted  . H/O small bowel obstruction 05/17/2019  . PAD (peripheral artery disease) ([place]) 05/14/2019  . Ulcer of left foot due to type 2 diabetes mellitus ([place])   . Skin ulcer of left foot, limited to breakdown of skin ([place])   . Charcot foot due to diabetes mellitus ([place])   . Charcot's joint of foot, left   . SBO (small bowel obstruction) ([place]) 05/06/2019  . Complete heart block ([place]) 05/06/2019  . Moderate protein-calorie malnutrition ([place])   . Diabetic foot ulcer ([place]) 10/19/2018  . Diabetic foot infection ([place])   . Pseudophakia, both eyes 10/04/2018  . Stage 3 chronic kidney disease 09/27/2018  . Adrenal insufficiency ([place]) 04/09/2015  . Peripheral edema 09/17/2014  . Shortness of breath 09/17/2014  . Bronchospasm, acute 09/16/2014  . Partial small bowel obstruction ([place]) 09/16/2014  . Hyperglycemia   . Diabetic retinopathy associated with type 2 diabetes mellitus ([place]) 09/13/2014  . Abdominal pain 09/13/2014  . Cough 11/15/2012  . Pharyngitis 09/14/2012  . Fluid overload 09/03/2012  . Ileus following gastrointestinal surgery ([place]) 09/03/2012  . Hypokalemia 08/30/2012  . Small bowel obstruction s/p LOA [id number] 08/21/2012  . Abnormal EKG 08/21/2012  . Chest pain 08/21/2012  . Macular  degeneration (senile) of retina 12/15/2011  . Lens replaced 12/15/2011  . Vitreous hemorrhage ([place]) 12/15/2011  . Essential hypertension 08/19/2008  . Regional enteritis of small intestine ([place]) 01/03/2008  . ERECTILE DYSFUNCTION, ORGANIC 01/03/2008  . Sarcoidosis 07/19/2007  . Diabetes mellitus ([place]) 07/19/2007  . HYPERLIPIDEMIA, MIXED 07/19/2007  . CERUMEN IMPACTION, BILATERAL 07/19/2007  . URINARY INCONTINENCE, STRESS, MALE 08/17/2006  . PROSTATE CANCER, HX OF 08/17/2006  . DIABETIC  RETINOPATHY 01/09/2002   Current Outpatient Medications on File Prior to Visit  Medication Sig Dispense Refill  . acetaminophen (TYLENOL) 650 MG CR tablet Take 650 mg by mouth every 8 (eight) hours as needed for pain.    [name] aspirin 81 MG chewable tablet Chew 81 mg by mouth every morning.     [name] atorvastatin (LIPITOR) 20 MG tablet Take 20 mg by mouth every morning.     . blood glucose meter kit and supplies KIT Dispense based on patient and insurance preference. Use up to four times daily as directed. (FOR ICD-9 250.00, 250.01). 1 each 1  . clopidogrel (PLAVIX) 75 MG tablet Take 1 tablet (75 mg total) by mouth daily with breakfast. 30 tablet 3  . docusate sodium (COLACE) 100 MG capsule Take 100 mg by mouth daily as needed for mild constipation.    . furosemide (LASIX) 40 MG tablet Take 1 tablet (40 mg total) by mouth daily. (Patient not taking: Reported on 06/03/2019) 30 tablet 0  . gabapentin (NEURONTIN) 300 MG capsule TAKE 1 CAPSULE(300 MG) BY MOUTH THREE TIMES DAILY (Patient taking differently: Take 300  mg by mouth 3 (three) times daily. ) 90 capsule 0  . Insulin Isophane & Regular Human (HUMULIN 70/30 MIX) (70-30) 100 UNIT/ML PEN Inject 16-20 Units into the skin daily before breakfast AND 16-20 Units daily before supper. (Patient not taking: Reported on 06/03/2019) 15 mL   . Insulin Pen Needle (PEN NEEDLES 31GX5/16") 31G X 8 MM MISC Use new needle with each insulin injection, twice a day. Dx E11.65, Z79.4  100 each 5  . Lancets (FREESTYLE) lancets Use as instructed 100 each 0  . Lancets 30G MISC     . losartan (COZAAR) 50 MG tablet Take 50 mg by mouth daily.    . Multiple Vitamin (MULTIVITAMIN WITH MINERALS) TABS tablet Take 1 tablet by mouth every morning. Centrum    . Polyethyl Glycol-Propyl Glycol (SYSTANE OP) Place 1 drop into both eyes daily as needed (dry eyes).     No current facility-administered medications on file prior to visit.    Allergies  Allergen Reactions  . Ace Inhibitors Itching and Cough  . Naproxen Anaphylaxis, Shortness Of Breath and Other (See Comments)    Throat swells. cannot breathe, and causes GI distress    Recent Results (from the past 2160 hour(s))  Comprehensive metabolic panel     Status: Abnormal   Collection Time: 04/19/19 10:13 AM  Result Value Ref Range   Glucose 139 (H) 65 - 99 mg/dL   BUN 31 (H) 8 - 27 mg/dL   Creatinine, Ser 1.69 (H) 0.76 - 1.27 mg/dL   GFR calc non Af Amer 41 (L) >59 mL/min/1.73   GFR calc Af Amer 47 (L) >59 mL/min/1.73   BUN/Creatinine Ratio 18 10 - 24   Sodium 141 134 - 144 mmol/L   Potassium 4.3 3.5 - 5.2 mmol/L   Chloride 104 96 - 106 mmol/L   CO2 24 20 - 29 mmol/L   Calcium 9.5 8.6 - 10.2 mg/dL   Total Protein 6.5 6.0 - 8.5 g/dL   Albumin 3.6 (L) 3.8 - 4.8 g/dL   Globulin, Total 2.9 1.5 - 4.5 g/dL   Albumin/Globulin Ratio 1.2 1.2 - 2.2   Bilirubin Total 0.5 0.0 - 1.2 mg/dL   Alkaline Phosphatase 126 (H) 39 - 117 IU/L   AST 24 0 - 40 IU/L   ALT 25 0 - 44 IU/L  Lipid panel     Status: None   Collection Time: 04/19/19 10:13 AM  Result Value Ref Range   Cholesterol, Total 158 100 - 199 mg/dL   Triglycerides 96 0 - 149 mg/dL   HDL 53 >39 mg/dL   VLDL Cholesterol Cal 18 5 - 40 mg/dL   LDL Chol Calc (NIH) 87 0 - 99 mg/dL   Chol/HDL Ratio 3.0 0.0 - 5.0 ratio    Comment:                                   T. Chol/HDL Ratio                                             Men  Women                               1/2  Avg.Risk  3.4    3.3  Avg.Risk  5.0    4.4                                2X Avg.Risk  9.6    7.1                                3X Avg.Risk 23.4   11.0   POCT glycosylated hemoglobin (Hb A1C)     Status: Abnormal   Collection Time: 04/19/19 10:14 AM  Result Value Ref Range   Hemoglobin A1C 11.7 (A) 4.0 - 5.6 %   HbA1c POC (<> result, manual entry)     HbA1c, POC (prediabetic range)     HbA1c, POC (controlled diabetic range)    WOUND CULTURE     Status: Abnormal   Collection Time: 04/30/19 11:57 AM   Specimen: Wound  Result Value Ref Range   MICRO NUMBER: [id number]    SPECIMEN QUALITY: Adequate    SOURCE: WOUND (SITE NOT SPECIFIED)    STATUS: ADDENDUM - FINAL    GRAM STAIN: Gram positive cocci in chains     Comment: No white blood cells seen No epithelial cells seen Moderate Gram positive cocci in chains Few Gram positive bacilli   ISOLATE 1: Streptococcus agalactiae (A)     Comment: Heavy growth of Group B Streptococcus isolated      Susceptibility   Streptococcus agalactiae - AEROBIC CULT, GRAM STAIN POSITIVE 1    AMPICILLIN <=0.25 Sensitive     CEFOTAXIME <=0.12 Sensitive     CEFTRIAXONE <=0.12 Sensitive     VANCOMYCIN 0.5 Sensitive     CLINDAMYCIN <=0.25 Sensitive     LEVOFLOXACIN 1 Sensitive     PENO - penicillin* 0.12 Sensitive      * Legend:S = Susceptible  I = IntermediateR = Resistant  NS = Not susceptible* = Not tested  NR = Not reported**NN = See antimicrobic comments  CBC with Differential     Status: Abnormal   Collection Time: 05/05/19  9:30 PM  Result Value Ref Range   WBC 3.6 (L) 4.0 - 10.5 K/uL   RBC 5.14 4.22 - 5.81 MIL/uL   Hemoglobin 10.9 (L) 13.0 - 17.0 g/dL   HCT 34.8 (L) 39.0 - 52.0 %   MCV 67.7 (L) 80.0 - 100.0 fL   MCH 21.2 (L) 26.0 - 34.0 pg   MCHC 31.3 30.0 - 36.0 g/dL   RDW 17.7 (H) 11.5 - 15.5 %   Platelets 202 150 - 400 K/uL   nRBC 0.0 0.0 - 0.2 %   Neutrophils Relative % 52 %   Neutro Abs 1.9 1.7 - 7.7 K/uL    Lymphocytes Relative 29 %   Lymphs Abs 1.1 0.7 - 4.0 K/uL   Monocytes Relative 14 %   Monocytes Absolute 0.5 0.1 - 1.0 K/uL   Eosinophils Relative 4 %   Eosinophils Absolute 0.2 0.0 - 0.5 K/uL   Basophils Relative 1 %   Basophils Absolute 0.0 0.0 - 0.1 K/uL   Immature Granulocytes 0 %   Abs Immature Granulocytes 0.01 0.00 - 0.07 K/uL    Comment: Performed at [facility], [address], [place], [place] [address]  Comprehensive metabolic panel     Status: Abnormal   Collection Time: 05/05/19  9:30 PM  Result Value Ref Range   Sodium 133 (L) 135 - 145 mmol/L  Potassium 4.2 3.5 - 5.1 mmol/L   Chloride 102 98 - 111 mmol/L   CO2 23 22 - 32 mmol/L   Glucose, Bld 127 (H) 70 - 99 mg/dL   BUN 23 8 - 23 mg/dL   Creatinine, Ser 1.69 (H) 0.61 - 1.24 mg/dL   Calcium 8.8 (L) 8.9 - 10.3 mg/dL   Total Protein 6.9 6.5 - 8.1 g/dL   Albumin 3.4 (L) 3.5 - 5.0 g/dL   AST 33 15 - 41 U/L   ALT 28 0 - 44 U/L   Alkaline Phosphatase 77 38 - 126 U/L   Total Bilirubin 1.1 0.3 - 1.2 mg/dL   GFR calc non Af Amer 41 (L) >60 mL/min   GFR calc Af Amer 47 (L) >60 mL/min   Anion gap 8 5 - 15    Comment: Performed at [facility], [address] [place], [place] [address]  Magnesium     Status: None   Collection Time: 05/05/19  9:30 PM  Result Value Ref Range   Magnesium 1.9 1.7 - 2.4 mg/dL    Comment: Performed at [facility], [place] [address] [place], [place] [address]  Lipase, blood     Status: None   Collection Time: 05/05/19  9:30 PM  Result Value Ref Range   Lipase 19 11 - 51 U/L    Comment: Performed at [facility] [address] [place], [place] [address]  SARS CORONAVIRUS 2 (TAT 6-24 HRS) Nasopharyngeal Nasopharyngeal Swab     Status: None   Collection Time: 05/05/19 10:47 PM   Specimen: Nasopharyngeal Swab  Result Value Ref Range   SARS Coronavirus 2 NEGATIVE NEGATIVE    Comment: (NOTE) SARS-CoV-2 target nucleic acids are NOT DETECTED. The SARS-CoV-2 RNA is  generally detectable in upper and lower respiratory specimens during the acute phase of infection. Negative results do not preclude SARS-CoV-2 infection, do not rule out co-infections with other pathogens, and should not be used as the sole basis for treatment or other patient management decisions. Negative results must be combined with clinical observations, patient history, and epidemiological information. The expected result is Negative. Fact Sheet for Patients: SugarRoll.be Fact Sheet for Healthcare Providers: https://www.woods-mathews.com/ This test is not yet approved or cleared by the [place] FDA and  has been authorized for detection and/or diagnosis of SARS-CoV-2 by FDA under an Emergency Use Authorization (EUA). This EUA will remain  in effect (meaning this test can be used) for the duration of the COVID-19 declaration under Section 56 4(b)(1) of the Act, 21 U.S.C. section 360bbb-3(b)(1), unless the authorization is terminated or revoked sooner. Performed at [facility], [place] [address], [place], [place] [address]   Troponin I (High Sensitivity)     Status: None   Collection Time: 05/06/19  2:20 AM  Result Value Ref Range   Troponin I (High Sensitivity) 9 <18 ng/L    Comment: (NOTE) Elevated high sensitivity troponin I (hsTnI) values and significant  changes across serial measurements may suggest ACS but many other  chronic and acute conditions are known to elevate hsTnI results.  Refer to the "Links" section for chest pain algorithms and additional  guidance. Performed at [facility], [place] [address], [place], [place] [address]   Cortisol-am, blood     Status: Abnormal   Collection Time: 05/06/19  2:20 AM  Result Value Ref Range   Cortisol - AM 25.6 (H) 6.7 - 22.6 ug/dL    Comment:  Performed at [facility], [place] [address] [place], [place] [address]  Culture, blood (Routine X 2) w Reflex to ID Panel      Status: None   Collection Time: 05/06/19  5:00 AM   Specimen: BLOOD LEFT ARM  Result Value Ref Range   Specimen Description BLOOD LEFT ARM    Special Requests      BOTTLES DRAWN AEROBIC AND ANAEROBIC Blood Culture results may not be optimal due to an excessive volume of blood received in culture bottles   Culture      NO GROWTH 5 DAYS Performed at [facility], [place] [address] [place], [place] [address]    Report Status 05/11/2019 FINAL   Cortisol-am, blood     Status: Abnormal   Collection Time: 05/06/19  5:20 AM  Result Value Ref Range   Cortisol - AM 49.1 (H) 6.7 - 22.6 ug/dL    Comment: Performed at [facility], [place] [address], [place], [place] [address]  Troponin I (High Sensitivity)     Status: None   Collection Time: 05/06/19  5:20 AM  Result Value Ref Range   Troponin I (High Sensitivity) 12 <18 ng/L    Comment: (NOTE) Elevated high sensitivity troponin I (hsTnI) values and significant  changes across serial measurements may suggest ACS but many other  chronic and acute conditions are known to elevate hsTnI results.  Refer to the "Links" section for chest pain algorithms and additional  guidance. Performed at [facility], [place] [address] [place], [place] [address]   Brain natriuretic peptide     Status: Abnormal   Collection Time: 05/06/19  5:20 AM  Result Value Ref Range   B Natriuretic Peptide 133.6 (H) 0.0 - 100.0 pg/mL    Comment: Performed at [place] [address], [place], [place] [address]  Hemoglobin A1c     Status: Abnormal   Collection Time: 05/06/19  5:20 AM  Result Value Ref Range   Hgb A1c MFr Bld 10.3 (H) 4.8 - 5.6 %    Comment: (NOTE) Pre diabetes:          5.7%-6.4% Diabetes:              >6.4% Glycemic control for   <7.0% adults with diabetes    Mean Plasma Glucose 248.91 mg/dL    Comment: Performed at [facility] [address] [place], [place] [address]  Lipid panel     Status: None   Collection  Time: 05/06/19  5:20 AM  Result Value Ref Range   Cholesterol 136 0 - 200 mg/dL   Triglycerides 47 <150 mg/dL   HDL 53 >40 mg/dL   Total CHOL/HDL Ratio 2.6 RATIO   VLDL 9 0 - 40 mg/dL   LDL Cholesterol 74 0 - 99 mg/dL    Comment:        Total Cholesterol/HDL:CHD Risk Coronary Heart Disease Risk Table                     Men   Women  1/2 Average Risk   3.4   3.3  Average Risk       5.0   4.4  2 X Average Risk   9.6   7.1  3 X Average Risk  23.4   11.0        Use the calculated Patient Ratio above and the CHD Risk Table to determine the patient's CHD Risk.  ATP III CLASSIFICATION (LDL):  <100     mg/dL   Optimal  100-129  mg/dL   Near or Above                    Optimal  130-159  mg/dL   Borderline  160-189  mg/dL   High  >190     mg/dL   Very High Performed at [facility] [address] [place], [place] [address]   TSH     Status: None   Collection Time: 05/06/19  5:20 AM  Result Value Ref Range   TSH 1.370 0.350 - 4.500 uIU/mL    Comment: Performed by a 3rd Generation assay with a functional sensitivity of <=0.01 uIU/mL. Performed at [facility], [place] [address] [place], [place] [address]   Basic metabolic panel     Status: Abnormal   Collection Time: 05/06/19  5:20 AM  Result Value Ref Range   Sodium 136 135 - 145 mmol/L   Potassium 4.8 3.5 - 5.1 mmol/L   Chloride 103 98 - 111 mmol/L   CO2 24 22 - 32 mmol/L   Glucose, Bld 143 (H) 70 - 99 mg/dL   BUN 20 8 - 23 mg/dL   Creatinine, Ser 1.63 (H) 0.61 - 1.24 mg/dL   Calcium 8.8 (L) 8.9 - 10.3 mg/dL   GFR calc non Af Amer 42 (L) >60 mL/min   GFR calc Af Amer 49 (L) >60 mL/min   Anion gap 9 5 - 15    Comment: Performed at [facility] [address], [place], [place] [address]  CBC     Status: Abnormal   Collection Time: 05/06/19  5:20 AM  Result Value Ref Range   WBC 4.7 4.0 - 10.5 K/uL   RBC 5.20 4.22 - 5.81 MIL/uL   Hemoglobin 10.5 (L) 13.0 - 17.0 g/dL   HCT 35.2 (L) 39.0 - 52.0 %    MCV 67.7 (L) 80.0 - 100.0 fL   MCH 20.2 (L) 26.0 - 34.0 pg   MCHC 29.8 (L) 30.0 - 36.0 g/dL   RDW 17.3 (H) 11.5 - 15.5 %   Platelets 210 150 - 400 K/uL    Comment: REPEATED TO VERIFY   nRBC 0.0 0.0 - 0.2 %    Comment: Performed at [facility], [place] [address] [place], [place] [address]  Culture, blood (Routine X 2) w Reflex to ID Panel     Status: None   Collection Time: 05/06/19  5:20 AM   Specimen: BLOOD LEFT HAND  Result Value Ref Range   Specimen Description BLOOD LEFT HAND    Special Requests      BOTTLES DRAWN AEROBIC ONLY Blood Culture results may not be optimal due to an excessive volume of blood received in culture bottles   Culture      NO GROWTH 5 DAYS Performed at [facility], [place] [address], [place], [place] [address]    Report Status 05/11/2019 FINAL   Protime-INR     Status: None   Collection Time: 05/06/19  7:48 AM  Result Value Ref Range   Prothrombin Time 14.7 11.4 - 15.2 seconds   INR 1.2 0.8 - 1.2    Comment: (NOTE) INR goal varies based on device and disease states. Performed at [facility], [place] [address], [place], [place] [address]   APTT     Status: None   Collection Time: 05/06/19  7:48  AM  Result Value Ref Range   aPTT 30 24 - 36 seconds    Comment: Performed at [facility], [place] [address], [place], [place] [address]  Type and screen [place]     Status: None   Collection Time: 05/06/19  7:48 AM  Result Value Ref Range   ABO/RH(D) O POS    Antibody Screen NEG    Sample Expiration      05/09/2019,2359 Performed at [facility], [place] [address], [place], [place] [address]   ABO/Rh     Status: None   Collection Time: 05/06/19  7:48 AM  Result Value Ref Range   ABO/RH(D)      O POS Performed at [facility] [address], [place], [place] [address]   CBG monitoring, ED     Status: Abnormal   Collection Time: 05/06/19  9:30 AM  Result Value Ref Range   Glucose-Capillary 156 (H) 70 -  99 mg/dL  ECHOCARDIOGRAM COMPLETE     Status: None   Collection Time: 05/06/19 10:40 AM  Result Value Ref Range   Weight 2,821.89 oz   Height 67 in   BP 180/79 mmHg  CBG monitoring, ED     Status: None   Collection Time: 05/06/19  1:08 PM  Result Value Ref Range   Glucose-Capillary 91 70 - 99 mg/dL  Glucose, capillary     Status: Abnormal   Collection Time: 05/06/19  6:56 PM  Result Value Ref Range   Glucose-Capillary 63 (L) 70 - 99 mg/dL  Glucose, capillary     Status: Abnormal   Collection Time: 05/06/19  8:04 PM  Result Value Ref Range   Glucose-Capillary 68 (L) 70 - 99 mg/dL  Glucose, capillary     Status: Abnormal   Collection Time: 05/06/19  9:10 PM  Result Value Ref Range   Glucose-Capillary 117 (H) 70 - 99 mg/dL  Glucose, capillary     Status: None   Collection Time: 05/07/19 12:09 AM  Result Value Ref Range   Glucose-Capillary 91 70 - 99 mg/dL  Comprehensive metabolic panel     Status: Abnormal   Collection Time: 05/07/19  3:16 AM  Result Value Ref Range   Sodium 141 135 - 145 mmol/L   Potassium 4.1 3.5 - 5.1 mmol/L   Chloride 110 98 - 111 mmol/L   CO2 21 (L) 22 - 32 mmol/L   Glucose, Bld 86 70 - 99 mg/dL   BUN 21 8 - 23 mg/dL   Creatinine, Ser 1.82 (H) 0.61 - 1.24 mg/dL   Calcium 8.9 8.9 - 10.3 mg/dL   Total Protein 6.3 (L) 6.5 - 8.1 g/dL   Albumin 3.2 (L) 3.5 - 5.0 g/dL   AST 30 15 - 41 U/L   ALT 22 0 - 44 U/L   Alkaline Phosphatase 59 38 - 126 U/L   Total Bilirubin 1.2 0.3 - 1.2 mg/dL   GFR calc non Af Amer 37 (L) >60 mL/min   GFR calc Af Amer 43 (L) >60 mL/min   Anion gap 10 5 - 15    Comment: Performed at [facility], [address] [place] [address]  CBC     Status: Abnormal   Collection Time: 05/07/19  3:16 AM  Result Value Ref Range   WBC 4.7 4.0 - 10.5 K/uL   RBC 5.12 4.22 - 5.81 MIL/uL   Hemoglobin 10.5 (L) 13.0 - 17.0 g/dL   HCT  34.8 (L) 39.0 - 52.0 %   MCV 68.0 (L) 80.0 - 100.0 fL   MCH 20.5 (L) 26.0 - 34.0 pg   MCHC 30.2  30.0 - 36.0 g/dL   RDW 17.4 (H) 11.5 - 15.5 %   Platelets 184 150 - 400 K/uL    Comment: REPEATED TO VERIFY   nRBC 0.0 0.0 - 0.2 %    Comment: Performed at [facility], [place] [address], [name], [place] [address]  Glucose, capillary     Status: None   Collection Time: 05/07/19  4:36 AM  Result Value Ref Range   Glucose-Capillary 87 70 - 99 mg/dL  Glucose, capillary     Status: Abnormal   Collection Time: 05/07/19  8:34 AM  Result Value Ref Range   Glucose-Capillary 107 (H) 70 - 99 mg/dL  Cortisol     Status: None   Collection Time: 05/07/19  9:07 AM  Result Value Ref Range   Cortisol, Plasma 17.7 ug/dL    Comment: (NOTE) AM    6.7 - 22.6 ug/dL PM   <10.0       ug/dL Performed at [facility] [address], [place], [place] [address]   Glucose, capillary     Status: Abnormal   Collection Time: 05/07/19 12:41 PM  Result Value Ref Range   Glucose-Capillary 139 (H) 70 - 99 mg/dL  Glucose, capillary     Status: Abnormal   Collection Time: 05/07/19  4:39 PM  Result Value Ref Range   Glucose-Capillary 148 (H) 70 - 99 mg/dL  Glucose, capillary     Status: Abnormal   Collection Time: 05/07/19  8:06 PM  Result Value Ref Range   Glucose-Capillary 163 (H) 70 - 99 mg/dL  Glucose, capillary     Status: Abnormal   Collection Time: 05/08/19 12:29 AM  Result Value Ref Range   Glucose-Capillary 130 (H) 70 - 99 mg/dL  Comprehensive metabolic panel     Status: Abnormal   Collection Time: 05/08/19  3:44 AM  Result Value Ref Range   Sodium 138 135 - 145 mmol/L   Potassium 3.7 3.5 - 5.1 mmol/L   Chloride 109 98 - 111 mmol/L   CO2 25 22 - 32 mmol/L   Glucose, Bld 119 (H) 70 - 99 mg/dL   BUN 26 (H) 8 - 23 mg/dL   Creatinine, Ser 2.01 (H) 0.61 - 1.24 mg/dL   Calcium 8.5 (L) 8.9 - 10.3 mg/dL   Total Protein 5.4 (L) 6.5 - 8.1 g/dL   Albumin 2.7 (L) 3.5 - 5.0 g/dL   AST 22 15 - 41 U/L   ALT 20 0 - 44 U/L   Alkaline Phosphatase 49 38 - 126 U/L   Total Bilirubin 1.1 0.3 - 1.2  mg/dL   GFR calc non Af Amer 33 (L) >60 mL/min   GFR calc Af Amer 38 (L) >60 mL/min   Anion gap 4 (L) 5 - 15    Comment: Performed at [facility], [address], [place] [address]  CBC     Status: Abnormal   Collection Time: 05/08/19  3:44 AM  Result Value Ref Range   WBC 5.2 4.0 - 10.5 K/uL   RBC 4.27 4.22 - 5.81 MIL/uL   Hemoglobin 8.8 (L) 13.0 - 17.0 g/dL    Comment: Reticulocyte Hemoglobin testing may be clinically indicated, consider ordering this additional test [id number]    HCT 28.5 (L) 39.0 - 52.0 %   MCV  66.7 (L) 80.0 - 100.0 fL   MCH 20.6 (L) 26.0 - 34.0 pg   MCHC 30.9 30.0 - 36.0 g/dL   RDW 17.4 (H) 11.5 - 15.5 %   Platelets 166 150 - 400 K/uL   nRBC 0.0 0.0 - 0.2 %    Comment: Performed at [facility] [address] [place], [place] [address]  Glucose, capillary     Status: Abnormal   Collection Time: 05/08/19  4:33 AM  Result Value Ref Range   Glucose-Capillary 116 (H) 70 - 99 mg/dL  Glucose, capillary     Status: Abnormal   Collection Time: 05/08/19  7:54 AM  Result Value Ref Range   Glucose-Capillary 108 (H) 70 - 99 mg/dL  Glucose, capillary     Status: Abnormal   Collection Time: 05/08/19 11:57 AM  Result Value Ref Range   Glucose-Capillary 111 (H) 70 - 99 mg/dL  Glucose, capillary     Status: Abnormal   Collection Time: 05/08/19  5:12 PM  Result Value Ref Range   Glucose-Capillary 119 (H) 70 - 99 mg/dL  Glucose, capillary     Status: Abnormal   Collection Time: 05/08/19 10:02 PM  Result Value Ref Range   Glucose-Capillary 211 (H) 70 - 99 mg/dL   Comment 1 Notify RN    Comment 2 Document in Chart   Comprehensive metabolic panel     Status: Abnormal   Collection Time: 05/09/19  3:00 AM  Result Value Ref Range   Sodium 137 135 - 145 mmol/L   Potassium 3.9 3.5 - 5.1 mmol/L   Chloride 107 98 - 111 mmol/L   CO2 24 22 - 32 mmol/L   Glucose, Bld 233 (H) 70 - 99 mg/dL   BUN 30 (H) 8 - 23 mg/dL   Creatinine, Ser 2.16 (H) 0.61 -  1.24 mg/dL   Calcium 8.3 (L) 8.9 - 10.3 mg/dL   Total Protein 5.4 (L) 6.5 - 8.1 g/dL   Albumin 2.6 (L) 3.5 - 5.0 g/dL   AST 24 15 - 41 U/L   ALT 21 0 - 44 U/L   Alkaline Phosphatase 57 38 - 126 U/L   Total Bilirubin 0.5 0.3 - 1.2 mg/dL   GFR calc non Af Amer 30 (L) >60 mL/min   GFR calc Af Amer 35 (L) >60 mL/min   Anion gap 6 5 - 15    Comment: Performed at [facility], [address], [place], [place] [address]  CBC     Status: Abnormal   Collection Time: 05/09/19  3:00 AM  Result Value Ref Range   WBC 3.2 (L) 4.0 - 10.5 K/uL   RBC 4.15 (L) 4.22 - 5.81 MIL/uL   Hemoglobin 8.5 (L) 13.0 - 17.0 g/dL    Comment: Reticulocyte Hemoglobin testing may be clinically indicated, consider ordering this additional test [id number]    HCT 28.1 (L) 39.0 - 52.0 %   MCV 67.7 (L) 80.0 - 100.0 fL   MCH 20.5 (L) 26.0 - 34.0 pg   MCHC 30.2 30.0 - 36.0 g/dL   RDW 17.2 (H) 11.5 - 15.5 %   Platelets 148 (L) 150 - 400 K/uL    Comment: REPEATED TO VERIFY   nRBC 0.0 0.0 - 0.2 %    Comment: Performed at [facility], [place] [address], [place], [place] [address]  Glucose, capillary     Status: Abnormal   Collection Time: 05/09/19  7:51 AM  Result Value Ref Range  Glucose-Capillary 199 (H) 70 - 99 mg/dL  Glucose, capillary     Status: Abnormal   Collection Time: 05/09/19 11:49 AM  Result Value Ref Range   Glucose-Capillary 136 (H) 70 - 99 mg/dL  Glucose, capillary     Status: Abnormal   Collection Time: 05/09/19  4:22 PM  Result Value Ref Range   Glucose-Capillary 143 (H) 70 - 99 mg/dL  POCT glucose (manual entry)     Status: Abnormal   Collection Time: 05/17/19 10:13 AM  Result Value Ref Range   POC Glucose 56 (A) 70 - 99 mg/dl  POCT glucose (manual entry)     Status: Abnormal   Collection Time: 05/17/19 10:36 AM  Result Value Ref Range   POC Glucose 63 (A) 70 - 99 mg/dl  POCT glucose (manual entry)     Status: Abnormal   Collection Time: 05/17/19 11:10 AM  Result Value Ref Range    POC Glucose 61 (A) 70 - 99 mg/dl  Glucose, random     Status: Abnormal   Collection Time: 05/17/19 11:10 AM  Result Value Ref Range   Glucose 53 (L) 65 - 99 mg/dL  CBC     Status: Abnormal   Collection Time: 05/17/19 11:12 AM  Result Value Ref Range   WBC 3.6 3.4 - 10.8 x10E3/uL   RBC 5.04 4.14 - 5.80 x10E6/uL   Hemoglobin 9.9 (L) 13.0 - 17.7 g/dL   Hematocrit 33.4 (L) 37.5 - 51.0 %   MCV 66 (L) 79 - 97 fL   MCH 19.6 (L) 26.6 - 33.0 pg   MCHC 29.6 (L) 31.5 - 35.7 g/dL   RDW 18.7 (H) 11.6 - 15.4 %   Platelets 156 150 - 450 x10E3/uL  Comprehensive metabolic panel     Status: Abnormal   Collection Time: 05/17/19 11:12 AM  Result Value Ref Range   Glucose 50 (L) 65 - 99 mg/dL   BUN 14 8 - 27 mg/dL   Creatinine, Ser 1.66 (H) 0.76 - 1.27 mg/dL   GFR calc non Af Amer 41 (L) >59 mL/min/1.73   GFR calc Af Amer 48 (L) >59 mL/min/1.73   BUN/Creatinine Ratio 8 (L) 10 - 24   Sodium 142 134 - 144 mmol/L   Potassium 4.8 3.5 - 5.2 mmol/L   Chloride 106 96 - 106 mmol/L   CO2 24 20 - 29 mmol/L   Calcium 9.3 8.6 - 10.2 mg/dL   Total Protein 6.4 6.0 - 8.5 g/dL   Albumin 3.6 (L) 3.8 - 4.8 g/dL   Globulin, Total 2.8 1.5 - 4.5 g/dL   Albumin/Globulin Ratio 1.3 1.2 - 2.2   Bilirubin Total 0.8 0.0 - 1.2 mg/dL   Alkaline Phosphatase 84 39 - 117 IU/L   AST 30 0 - 40 IU/L   ALT 31 0 - 44 IU/L  POCT glucose (manual entry)     Status: Abnormal   Collection Time: 05/17/19 11:43 AM  Result Value Ref Range   POC Glucose 65 (A) 70 - 99 mg/dl  CBC with Differential     Status: Abnormal   Collection Time: 05/17/19  1:04 PM  Result Value Ref Range   WBC 3.6 (L) 4.0 - 10.5 K/uL   RBC 4.99 4.22 - 5.81 MIL/uL   Hemoglobin 10.5 (L) 13.0 - 17.0 g/dL   HCT 34.0 (L) 39.0 - 52.0 %   MCV 68.1 (L) 80.0 - 100.0 fL   MCH 21.0 (L) 26.0 - 34.0 pg   MCHC 30.9 30.0 - 36.0  g/dL   RDW 17.9 (H) 11.5 - 15.5 %   Platelets 136 (L) 150 - 400 K/uL   nRBC 0.0 0.0 - 0.2 %   Neutrophils Relative % 62 %   Neutro Abs 2.3  1.7 - 7.7 K/uL   Lymphocytes Relative 21 %   Lymphs Abs 0.7 0.7 - 4.0 K/uL   Monocytes Relative 13 %   Monocytes Absolute 0.5 0.1 - 1.0 K/uL   Eosinophils Relative 3 %   Eosinophils Absolute 0.1 0.0 - 0.5 K/uL   Basophils Relative 1 %   Basophils Absolute 0.0 0.0 - 0.1 K/uL   Immature Granulocytes 0 %   Abs Immature Granulocytes 0.01 0.00 - 0.07 K/uL    Comment: Performed at [facility] [address], [place], [place] [address]  Basic metabolic panel     Status: Abnormal   Collection Time: 05/17/19  1:04 PM  Result Value Ref Range   Sodium 139 135 - 145 mmol/L   Potassium 4.1 3.5 - 5.1 mmol/L   Chloride 106 98 - 111 mmol/L   CO2 25 22 - 32 mmol/L   Glucose, Bld 78 70 - 99 mg/dL   BUN 12 8 - 23 mg/dL   Creatinine, Ser 1.55 (H) 0.61 - 1.24 mg/dL   Calcium 9.2 8.9 - 10.3 mg/dL   GFR calc non Af Amer 45 (L) >60 mL/min   GFR calc Af Amer 52 (L) >60 mL/min   Anion gap 8 5 - 15    Comment: Performed at [facility] [address], [place], [place] [address]  SARS Coronavirus 2 by RT PCR (hospital order, performed in [facility] hospital lab) Nasopharyngeal Nasopharyngeal Swab     Status: None   Collection Time: 05/30/19  6:44 AM   Specimen: Nasopharyngeal Swab  Result Value Ref Range   SARS Coronavirus 2 NEGATIVE NEGATIVE    Comment: (NOTE) If result is NEGATIVE SARS-CoV-2 target nucleic acids are NOT DETECTED. The SARS-CoV-2 RNA is generally detectable in upper and lower  respiratory specimens during the acute phase of infection. The lowest  concentration of SARS-CoV-2 viral copies this assay can detect is 250  copies / mL. A negative result does not preclude SARS-CoV-2 infection  and should not be used as the sole basis for treatment or other  patient management decisions.  A negative result may occur with  improper specimen collection / handling, submission of specimen other  than nasopharyngeal swab, presence of viral mutation(s) within the  areas targeted by  this assay, and inadequate number of viral copies  (<250 copies / mL). A negative result must be combined with clinical  observations, patient history, and epidemiological information. If result is POSITIVE SARS-CoV-2 target nucleic acids are DETECTED. The SARS-CoV-2 RNA is generally detectable in upper and lower  respiratory specimens dur ing the acute phase of infection.  Positive  results are indicative of active infection with SARS-CoV-2.  Clinical  correlation with patient history and other diagnostic information is  necessary to determine patient infection status.  Positive results do  not rule out bacterial infection or co-infection with other viruses. If result is PRESUMPTIVE POSTIVE SARS-CoV-2 nucleic acids MAY BE PRESENT.   A presumptive positive result was obtained on the submitted specimen  and confirmed on repeat testing.  While 2019 novel coronavirus  (SARS-CoV-2) nucleic acids may be present in the submitted sample  additional confirmatory testing may be necessary for epidemiological  and / or clinical management purposes  to differentiate  between  SARS-CoV-2 and other Sarbecovirus currently known to infect humans.  If clinically indicated additional testing with an alternate test  methodology [phone]) is advised. The SARS-CoV-2 RNA is generally  detectable in upper and lower respiratory sp ecimens during the acute  phase of infection. The expected result is Negative. Fact Sheet for Patients:  StrictlyIdeas.no Fact Sheet for Healthcare Providers: BankingDealers.co.za This test is not yet approved or cleared by the [place] FDA and has been authorized for detection and/or diagnosis of SARS-CoV-2 by FDA under an Emergency Use Authorization (EUA).  This EUA will remain in effect (meaning this test can be used) for the duration of the COVID-19 declaration under Section 564(b)(1) of the Act, 21 U.S.C. section  360bbb-3(b)(1), unless the authorization is terminated or revoked sooner. Performed at [facility], [place] [address], [place], [place] [address]   I-STAT, [place] 8     Status: Abnormal   Collection Time: 05/30/19  6:56 AM  Result Value Ref Range   Sodium 140 135 - 145 mmol/L   Potassium 3.9 3.5 - 5.1 mmol/L   Chloride 101 98 - 111 mmol/L   BUN 19 8 - 23 mg/dL   Creatinine, Ser 1.70 (H) 0.61 - 1.24 mg/dL   Glucose, Bld 128 (H) 70 - 99 mg/dL   Calcium, Ion 1.22 1.15 - 1.40 mmol/L   TCO2 29 22 - 32 mmol/L   Hemoglobin 12.2 (L) 13.0 - 17.0 g/dL   HCT 36.0 (L) 39.0 - 52.0 %  Glucose, capillary     Status: Abnormal   Collection Time: 05/30/19 11:32 AM  Result Value Ref Range   Glucose-Capillary 123 (H) 70 - 99 mg/dL  Glucose, capillary     Status: Abnormal   Collection Time: 05/30/19  5:23 PM  Result Value Ref Range   Glucose-Capillary 119 (H) 70 - 99 mg/dL   Comment 1 Notify RN    Comment 2 Document in Chart   Glucose, capillary     Status: Abnormal   Collection Time: 05/30/19  9:00 PM  Result Value Ref Range   Glucose-Capillary 225 (H) 70 - 99 mg/dL  Glucose, capillary     Status: Abnormal   Collection Time: 05/31/19  5:58 AM  Result Value Ref Range   Glucose-Capillary 204 (H) 70 - 99 mg/dL  Glucose, capillary     Status: Abnormal   Collection Time: 05/31/19  7:53 AM  Result Value Ref Range   Glucose-Capillary 194 (H) 70 - 99 mg/dL  Glucose, capillary     Status: Abnormal   Collection Time: 05/31/19 11:45 AM  Result Value Ref Range   Glucose-Capillary 129 (H) 70 - 99 mg/dL    Objective: There were no vitals filed for this visit.  General: Patient is awake, alert, oriented x 3 and in no acute distress.  Dermatology: Skin is warm and dry bilateral with a healed area of previous ulceration plantar left lateral midfoot with mild reactive keratosis. There is no malodor, no active drainage, no erythema, no edema. No acute signs of infection.   Vascular: Dorsalis Pedis  pulse = 1/4 Bilateral,  Posterior Tibial pulse = 1/4 Bilateral,  Capillary Fill Time < 5 seconds  Neurologic: Protective sensation diminished bilateral.  Musculosketal: There is Charcot deformity noted on left foot.  No pain to previous ulceration area is now healed plantar left lateral midfoot.   dNo results for input(s): GRAMSTAIN, LABORGA in the last 8760 hours.  Assessment and Plan:  Problem List Items Addressed This Visit  Cardiovascular and Mediastinum   PAD (peripheral artery disease) (HCC)     Endocrine   Diabetic foot ulcer ([place]) - Primary   Relevant Medications   mupirocin ointment (BACTROBAN) 2 %   Other Relevant Orders   DG Foot Complete Left (Completed)   Charcot foot due to diabetes mellitus ([place])   Relevant Orders   DG Foot Complete Left (Completed)    Other Visit Diagnoses    Diabetic peripheral neuropathy associated with type 2 diabetes mellitus ([place])         -Examined patient and discussed the progression of the wound and treatment alternatives. -Xrays reviewed consistent with coalescing Charcot -Previous ulceration well-healed on left foot like before with reactive keratosis which was mechanically debrided using a sterile chisel blade this visit without incident -Applied antibiotic cream and Band-Aid with clean sock for protection -Continue with postop shoe with [employer]ys Plastizote offloading insole -Continue with vascular follow-up patient is status post angiogram -Continue with no work until after he is casted for [employer] however did advise patient that if his job will let him go back with surgical offloading shoe then he may can return sooner if they do not allow him to go back with surgical offloading shoe then must continue to stay out of work until his [employer] can be fabricated - Advised patient to go to the ER or return to office if the wound worsens or if constitutional symptoms are present. -Patient to return to the office to see Liliane Channel for casting  for [employer], DPM

## 2019-06-05 ENCOUNTER — Telehealth: Payer: Self-pay | Admitting: Sports Medicine

## 2019-06-05 NOTE — Telephone Encounter (Signed)
error 

## 2019-06-13 DIAGNOSIS — H4720 Unspecified optic atrophy: Secondary | ICD-10-CM | POA: Insufficient documentation

## 2019-06-18 ENCOUNTER — Ambulatory Visit: Payer: Medicare Other | Admitting: Family Medicine

## 2019-06-21 ENCOUNTER — Other Ambulatory Visit: Payer: Self-pay

## 2019-06-21 ENCOUNTER — Ambulatory Visit (INDEPENDENT_AMBULATORY_CARE_PROVIDER_SITE_OTHER): Payer: Medicare Other | Admitting: Family Medicine

## 2019-06-21 ENCOUNTER — Encounter: Payer: Self-pay | Admitting: Family Medicine

## 2019-06-21 VITALS — BP 160/60 | HR 70 | Temp 98.3°F | Ht 66.0 in | Wt 181.0 lb

## 2019-06-21 DIAGNOSIS — E11621 Type 2 diabetes mellitus with foot ulcer: Secondary | ICD-10-CM

## 2019-06-21 DIAGNOSIS — Z597 Insufficient social insurance and welfare support: Secondary | ICD-10-CM | POA: Diagnosis not present

## 2019-06-21 DIAGNOSIS — R0982 Postnasal drip: Secondary | ICD-10-CM

## 2019-06-21 DIAGNOSIS — L97529 Non-pressure chronic ulcer of other part of left foot with unspecified severity: Secondary | ICD-10-CM

## 2019-06-21 DIAGNOSIS — R600 Localized edema: Secondary | ICD-10-CM

## 2019-06-21 MED ORDER — FUROSEMIDE 40 MG PO TABS: 40.0000 mg | ORAL_TABLET | Freq: Two times a day (BID) | ORAL | 0 refills | Status: DC

## 2019-06-21 NOTE — Patient Instructions (Addendum)
If you have lab work done today you will be contacted with your lab results within the next 2 weeks.  If you have not heard from [place] then please contact us. The fastest way to get your results is to register for My Chart.   IF you received an x-ray today, you will receive an invoice from [facility]. Please contact [facility] at [phone] with questions or concerns regarding your invoice.   IF you received labwork today, you will receive an invoice from [facility]. Please contact [facility] at [phone] with questions or concerns regarding your invoice.   Our billing staff will not be able to assist you with questions regarding bills from these companies.  You will be contacted with the lab results as soon as they are available. The fastest way to get your results is to activate your My Chart account. Instructions are located on the last page of this paperwork. If you have not heard from [place] regarding the results in 2 weeks, please contact this office.     [name] Postnasal Drip Postnasal drip is the feeling of mucus going down the back of your throat. Mucus is a slimy substance that moistens and cleans your nose and throat, as well as the air pockets in face bones near your forehead and cheeks (sinuses). Small amounts of mucus pass from your nose and sinuses down the back of your throat all the time. This is normal. When you produce too much mucus or the mucus gets too thick, you can feel it. Some common causes of postnasal drip include:  Having more mucus because of: ? A cold or the flu. ? Allergies. ? Cold air. ? Certain medicines.  Having more mucus that is thicker because of: ? A sinus or nasal infection. ? Dry air. ? A food allergy. Follow these instructions at home: Relieving discomfort   Gargle with a salt-water mixture 3-4 times a day or as needed. To make a salt-water mixture, completely dissolve -1 tsp of salt in 1 cup of warm water.  If the air in  your home is dry, use a humidifier to add moisture to the air.  Use a saline spray or container (neti pot) to flush out the nose (nasal irrigation). These methods can help clear away mucus and keep the nasal passages moist. General instructions  Take over-the-counter and prescription medicines only as told by your health care provider.  Follow instructions from your health care provider about eating or drinking restrictions. You may need to avoid caffeine.  Avoid things that you know you are allergic to (allergens), like dust, mold, pollen, pets, or certain foods.  Drink enough fluid to keep your urine pale yellow.  Keep all follow-up visits as told by your health care provider. This is important. Contact a health care provider if:  You have a fever.  You have a sore throat.  You have difficulty swallowing.  You have headache.  You have sinus pain.  You have a cough that does not go away.  The mucus from your nose becomes thick and is green or yellow in color.  You have cold or flu symptoms that last more than 10 days. Summary  Postnasal drip is the feeling of mucus going down the back of your throat.  If your health care provider approves, use nasal irrigation or a nasal spray 2?4 times a day.  Avoid things that you know you are allergic to (allergens), like dust, mold, pollen, pets, or certain  foods. This information is not intended to replace advice given to you by your health care provider. Make sure you discuss any questions you have with your health care provider. Document Released: 11/14/2016 Document Revised: 11/23/2018 Document Reviewed: 11/14/2016 Elsevier Patient Education  2020 [employer].

## 2019-06-21 NOTE — Progress Notes (Signed)
11/6/202010:56 AM  [name] 02-11-1951, 68 y.o., male [id number]  Chief Complaint  Patient presents with  . cant sleep    wheezing feeling when laying down     HPI:   Patient is a 68 y.o. male with past medical history significant for HTN, [id number] complications,HLP, PAD who presents today for followup  Last ov 2 weeks ago, since then Had retinal exam, stable proliferative retinopathy in both eyes, no macular edema, followup 9 mths Saw podiatry, coalescing charcot, ulcer healing, no work until casted for [employer] but could go back with surgical offloading shoe, cast scheduled for nov 9th Has not heard from community and wellness clinic Went to court, restraining order removed, allowed to move back home, things are going ok He has his insulin back, after being wo for about a week Continues to struggle financially, has been unable to afford BP meds He finally got his food stamps His older sister died recently from covid  2 weeks ago started having gurgling, bubbling sound in his cheat whenever he lies down, no chest or abd pain, no SOB, no cough, has nasal congestion but no postnasal drip, occasionally able to expectorate Has been sleeping in the recliner as the noise interfers with his sleep Not getting good sleep Drinking hot tea with lemon - helps  Having edema of both legs since intervention for PAD, taking lasix 42m daily, elevating helps Echo in sept 2020, LVEF 60-65% with impaired diastolic relaxation  Lab Results  Component Value Date   CREATININE 1.70 (H) 05/30/2019   BUN 19 05/30/2019   NA 140 05/30/2019   K 3.9 05/30/2019   CL 101 05/30/2019   CO2 25 05/17/2019    Wt Readings from Last 3 Encounters:  06/21/19 181 lb (82.1 kg)  06/03/19 176 lb (79.8 kg)  05/30/19 173 lb (78.5 kg)    [facility] 06/21/2019 06/03/2019 05/17/2019  Decreased Interest 0 0 0  Down, Depressed, Hopeless 0 0 0  PHQ - 2 Score 0 0 0    Fall Risk  06/21/2019  06/03/2019 05/17/2019 04/19/2019 02/07/2019  Falls in the past year? 0 0 0 0 0  Number falls in past yr: 0 0 0 0 0  Injury with Fall? 0 0 0 0 0  Follow up Falls evaluation completed - - - -     Allergies  Allergen Reactions  . Ace Inhibitors Itching and Cough  . Naproxen Anaphylaxis, Shortness Of Breath and Other (See Comments)    Throat swells. cannot breathe, and causes GI distress    Prior to Admission medications   Medication Sig Start Date End Date Taking? Authorizing Provider  aspirin 81 MG chewable tablet Chew 81 mg by mouth every morning.    Yes [provider]  atorvastatin (LIPITOR) 20 MG tablet Take 20 mg by mouth every morning.  09/27/18  Yes [provider]  clopidogrel (PLAVIX) 75 MG tablet Take 1 tablet (75 mg total) by mouth daily with breakfast. 05/31/19  Yes CUlyses Amor PA-C  docusate sodium (COLACE) 100 MG capsule Take 100 mg by mouth daily as needed for mild constipation.   Yes [provider]  furosemide (LASIX) 40 MG tablet Take 1 tablet (40 mg total) by mouth daily. 05/13/19  Yes AAline August MD  gabapentin (NEURONTIN) 300 MG capsule TAKE 1 CAPSULE(300 MG) BY MOUTH THREE TIMES DAILY Patient taking differently: Take 300 mg by mouth 3 (three) times daily.  02/12/19  Yes SGrant FontanaM,  MD  Insulin Isophane & Regular Human (HUMULIN 70/30 MIX) (70-30) 100 UNIT/ML PEN Inject 16-20 Units into the skin daily before breakfast AND 16-20 Units daily before supper. 05/13/19  Yes Philemon Kingdom, MD  Insulin Pen Needle (PEN NEEDLES 31GX5/16") 31G X 8 MM MISC Use new needle with each insulin injection, twice a day. Dx E11.65, Z79.4 04/19/19  Yes [name], MD  Lancets (FREESTYLE) lancets Use as instructed 09/18/14  Yes Delfina Redwood, MD  Lancets 30G MISC  04/06/16  Yes [provider]  Multiple Vitamin (MULTIVITAMIN WITH MINERALS) TABS tablet Take 1 tablet by mouth every morning. Centrum   Yes [provider]  mupirocin  ointment (BACTROBAN) 2 % Apply to left foot once daily 06/04/19 06/03/20 Yes Stover, Titorya, DPM  Polyethyl Glycol-Propyl Glycol (SYSTANE OP) Place 1 drop into both eyes daily as needed (dry eyes).   Yes [provider]  acetaminophen (TYLENOL) 650 MG CR tablet Take 650 mg by mouth every 8 (eight) hours as needed for pain.    [provider]  blood glucose meter kit and supplies KIT Dispense based on patient and insurance preference. Use up to four times daily as directed. (FOR ICD-9 250.00, 250.01). Patient not taking: Reported on 06/21/2019 10/24/18   Domenic Polite, MD  losartan (COZAAR) 50 MG tablet Take 50 mg by mouth daily.    [provider]    Past Medical History:  Diagnosis Date  . Cellulitis and abscess of face 10/11/2007   Qualifier: Diagnosis of  By: Amil Amen MD, Benjamine Mola    . Diabetic retinopathy ([place])   . Diabetic retinopathy associated with type 2 diabetes mellitus ([place]) 09/13/2014   He works for [employer]  . Fluid overload 09/03/2012   Post op  . GERD (gastroesophageal reflux disease)   . Visual impairment     Past Surgical History:  Procedure Laterality Date  . ABDOMINAL AORTOGRAM W/LOWER EXTREMITY Left 05/30/2019   Procedure: ABDOMINAL AORTOGRAM W/LOWER EXTREMITY;  Surgeon: Marty Heck, MD;  Location: [facility];  Service: Cardiovascular;  Laterality: Left;  . CATARACT EXTRACTION W/ INTRAOCULAR LENS  IMPLANT, BILATERAL    . EYE SURGERY Bilateral    "laser OR for diabetic retinopathy"  . INGUINAL HERNIA REPAIR     [name] 07/12/2010), "don't remember which side"  . LAPAROTOMY  08/23/2012   Procedure: EXPLORATORY LAPAROTOMY;  Surgeon: [name], DO;  Location: [facility];  Service: General;  Laterality: N/A;  exploratory laparotomy with lysis of adhesions  . LYSIS OF ADHESION  08/23/2012   Procedure: LYSIS OF ADHESION;  Surgeon: [name], DO;  Location: [facility];  Service: General;;  . PERIPHERAL VASCULAR BALLOON  ANGIOPLASTY  05/30/2019   Procedure: PERIPHERAL VASCULAR BALLOON ANGIOPLASTY;  Surgeon: Marty Heck, MD;  Location: [facility];  Service: Cardiovascular;;  left anterior tibial  . ROBOT ASSISTED LAPAROSCOPIC RADICAL PROSTATECTOMY  2000's   "had to finish manually after machine broke"  . SHOULDER SURGERY  1970's   separation; from playing football"    Social History   Tobacco Use  . Smoking status: Former Smoker    Packs/day: 2.50    Years: 3.00    Pack years: 7.50    Types: Cigarettes    Quit date: 11/09/1966    Years since quitting: 52.6  . Smokeless tobacco: Never Used  Substance Use Topics  . Alcohol use: Yes    Comment: 04/08/2015 "maybe a beer/ or 2 or a glass of wine  monthly"    Family History  Problem Relation Age of Onset  . Diabetes Mellitus II Mother   . Colon cancer Neg Hx     ROS Per hpi  OBJECTIVE:  Today's Vitals   06/21/19 1039 06/21/19 1142  BP: (!) 178/70 (!) 160/60  Pulse: 70   Temp: 98.3 F (36.8 C)   TempSrc: Oral   SpO2: 97%   Weight: 181 lb (82.1 kg)   Height: '5\' 6"'  (1.676 m)    Body mass index is 29.21 kg/m.   Physical Exam Vitals signs and nursing note reviewed.  Constitutional:      Appearance: He is well-developed.  HENT:     Head: Normocephalic and atraumatic.     Right Ear: Hearing, tympanic membrane, ear canal and external ear normal.     Left Ear: Hearing, tympanic membrane, ear canal and external ear normal.     Nose: Congestion and rhinorrhea (thick, yellow) present.     Right Sinus: No maxillary sinus tenderness or frontal sinus tenderness.     Left Sinus: No maxillary sinus tenderness or frontal sinus tenderness.     Mouth/Throat:     Pharynx: No oropharyngeal exudate.  Eyes:     Conjunctiva/sclera: Conjunctivae normal.     Pupils: Pupils are equal, round, and reactive to light.  Neck:     Musculoskeletal: Neck supple.  Cardiovascular:     Rate and Rhythm: Normal rate and regular rhythm.     Heart  sounds: No murmur. No friction rub. No gallop.   Pulmonary:     Effort: Pulmonary effort is normal.     Breath sounds: Normal breath sounds. No wheezing, rhonchi or rales.  Musculoskeletal:     Right lower leg: Edema present.     Left lower leg: Edema present.     Comments: +2 pitting edema  Lymphadenopathy:     Cervical: No cervical adenopathy.  Skin:    General: Skin is warm and dry.  Neurological:     Mental Status: He is alert and oriented to person, place, and time.     No results found for this or any previous visit (from the past 24 hour(s)).  No results found.   ASSESSMENT and PLAN  1. Postnasal drip Discussed use of OTC meds, saline nasal washes.   2. Bilateral leg edema Discussed increasing lasix BID and elevation Recheck BMP at next OV  3. Ulcer of left foot due to type 2 diabetes mellitus ([place]) Per podiatry, should be able to return to work soon  4. Insufficient social insurance and welfare support Have reached out community and wellness, pending transfer  Other orders - furosemide (LASIX) 40 MG tablet; Take 1 tablet (40 mg total) by mouth 2 (two) times daily.  Return in about 1 week (around 06/28/2019).    [name], MD Primary Care at [address] [place], [place] [address] Ph.  [phone] Fax [phone]
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
## 2019-06-24 ENCOUNTER — Other Ambulatory Visit: Payer: Self-pay

## 2019-06-24 ENCOUNTER — Encounter: Payer: Self-pay | Admitting: Cardiovascular Disease

## 2019-06-24 ENCOUNTER — Ambulatory Visit (INDEPENDENT_AMBULATORY_CARE_PROVIDER_SITE_OTHER): Payer: Medicare Other | Admitting: Cardiovascular Disease

## 2019-06-24 VITALS — BP 162/60 | HR 70 | Temp 97.4°F | Ht 66.0 in | Wt 180.0 lb

## 2019-06-24 DIAGNOSIS — I5033 Acute on chronic diastolic (congestive) heart failure: Secondary | ICD-10-CM

## 2019-06-24 DIAGNOSIS — I440 Atrioventricular block, first degree: Secondary | ICD-10-CM

## 2019-06-24 DIAGNOSIS — I10 Essential (primary) hypertension: Secondary | ICD-10-CM | POA: Diagnosis not present

## 2019-06-24 DIAGNOSIS — I739 Peripheral vascular disease, unspecified: Secondary | ICD-10-CM | POA: Diagnosis not present

## 2019-06-24 DIAGNOSIS — R001 Bradycardia, unspecified: Secondary | ICD-10-CM | POA: Diagnosis not present

## 2019-06-24 MED ORDER — LOSARTAN POTASSIUM 50 MG PO TABS: 50.0000 mg | ORAL_TABLET | Freq: Every day | ORAL | 3 refills | Status: DC

## 2019-06-24 MED ORDER — TORSEMIDE 100 MG PO TABS: ORAL_TABLET | ORAL | 3 refills | Status: DC

## 2019-06-24 NOTE — Patient Instructions (Signed)
Medication Instructions:  Stop taking Lasix Start taking 50mg  Torsemide daily. Start taking Losartan.  If you need a refill on your cardiac medications before your next appointment, please call your pharmacy.   Lab work: BMET, BNP If you have labs (blood work) drawn today and your tests are completely normal, you will receive your results only by: [place] (if you have MyChart) OR A paper copy in the mail If you have any lab test that is abnormal or we need to change your treatment, we will call you to review the results.  Testing/Procedures: Your physician has recommended that you wear an 7 DAY event monitor. Event monitors are medical devices that record the heart's electrical activity. Doctors most often [place] these monitors to diagnose arrhythmias. Arrhythmias are problems with the speed or rhythm of the heartbeat. The monitor is a small, portable device. You can wear one while you do your normal daily activities. This is usually used to diagnose what is causing palpitations/syncope (passing out). This will be mailed to you.    Follow-Up: At [facility], you and your health needs are our priority.  As part of our continuing mission to provide you with exceptional heart care, we have created designated Provider Care Teams.  These Care Teams include your primary Cardiologist (physician) and Advanced Practice Providers (APPs -  Physician Assistants and Nurse Practitioners) who all work together to provide you with the care you need, when you need it. You may see Evalina Field, MD or one of the following Advanced Practice Providers on your designated Care Team:    Almyra Deforest, PA-C  Fabian Sharp, [place] or   Roby Lofts, [place]   Your physician wants you to follow-up in: 1 week.  Any Other Special Instructions Will Be Listed Below (If Applicable). [place] WITH STATUS

## 2019-06-25 ENCOUNTER — Encounter (HOSPITAL_COMMUNITY): Payer: Self-pay | Admitting: Emergency Medicine

## 2019-06-25 ENCOUNTER — Ambulatory Visit: Payer: Medicare Other | Admitting: Sports Medicine

## 2019-06-25 ENCOUNTER — Telehealth: Payer: Self-pay | Admitting: Cardiovascular Disease

## 2019-06-25 ENCOUNTER — Inpatient Hospital Stay (HOSPITAL_COMMUNITY)
Admission: EM | Admit: 2019-06-25 | Discharge: 2019-06-29 | DRG: 291 | Disposition: A | Discharge status: Home / Self Care | Payer: Medicare Other | Attending: Internal Medicine | Admitting: Internal Medicine

## 2019-06-25 ENCOUNTER — Telehealth: Payer: Self-pay | Admitting: Family Medicine

## 2019-06-25 ENCOUNTER — Other Ambulatory Visit: Payer: Medicare Other | Admitting: Orthotics

## 2019-06-25 ENCOUNTER — Telehealth: Payer: Self-pay | Admitting: Internal Medicine

## 2019-06-25 ENCOUNTER — Emergency Department (HOSPITAL_COMMUNITY): Payer: Medicare Other

## 2019-06-25 DIAGNOSIS — E11621 Type 2 diabetes mellitus with foot ulcer: Secondary | ICD-10-CM | POA: Diagnosis present

## 2019-06-25 DIAGNOSIS — E8770 Fluid overload, unspecified: Secondary | ICD-10-CM

## 2019-06-25 DIAGNOSIS — Z7982 Long term (current) use of aspirin: Secondary | ICD-10-CM

## 2019-06-25 DIAGNOSIS — Z9119 Patient's noncompliance with other medical treatment and regimen: Secondary | ICD-10-CM

## 2019-06-25 DIAGNOSIS — N289 Disorder of kidney and ureter, unspecified: Secondary | ICD-10-CM | POA: Diagnosis not present

## 2019-06-25 DIAGNOSIS — E1122 Type 2 diabetes mellitus with diabetic chronic kidney disease: Secondary | ICD-10-CM | POA: Diagnosis present

## 2019-06-25 DIAGNOSIS — I5033 Acute on chronic diastolic (congestive) heart failure: Secondary | ICD-10-CM | POA: Diagnosis present

## 2019-06-25 DIAGNOSIS — Z9114 Patient's other noncompliance with medication regimen: Secondary | ICD-10-CM

## 2019-06-25 DIAGNOSIS — I739 Peripheral vascular disease, unspecified: Secondary | ICD-10-CM

## 2019-06-25 DIAGNOSIS — H547 Unspecified visual loss: Secondary | ICD-10-CM | POA: Diagnosis present

## 2019-06-25 DIAGNOSIS — Z79899 Other long term (current) drug therapy: Secondary | ICD-10-CM

## 2019-06-25 DIAGNOSIS — Z9842 Cataract extraction status, left eye: Secondary | ICD-10-CM

## 2019-06-25 DIAGNOSIS — K4090 Unilateral inguinal hernia, without obstruction or gangrene, not specified as recurrent: Secondary | ICD-10-CM | POA: Diagnosis present

## 2019-06-25 DIAGNOSIS — Z87891 Personal history of nicotine dependence: Secondary | ICD-10-CM

## 2019-06-25 DIAGNOSIS — Z9841 Cataract extraction status, right eye: Secondary | ICD-10-CM

## 2019-06-25 DIAGNOSIS — Z20828 Contact with and (suspected) exposure to other viral communicable diseases: Secondary | ICD-10-CM | POA: Diagnosis present

## 2019-06-25 DIAGNOSIS — D509 Iron deficiency anemia, unspecified: Secondary | ICD-10-CM | POA: Diagnosis present

## 2019-06-25 DIAGNOSIS — Z9111 Patient's noncompliance with dietary regimen: Secondary | ICD-10-CM

## 2019-06-25 DIAGNOSIS — L97529 Non-pressure chronic ulcer of other part of left foot with unspecified severity: Secondary | ICD-10-CM | POA: Diagnosis present

## 2019-06-25 DIAGNOSIS — Z89422 Acquired absence of other left toe(s): Secondary | ICD-10-CM

## 2019-06-25 DIAGNOSIS — N170 Acute kidney failure with tubular necrosis: Secondary | ICD-10-CM

## 2019-06-25 DIAGNOSIS — Z833 Family history of diabetes mellitus: Secondary | ICD-10-CM

## 2019-06-25 DIAGNOSIS — Z7902 Long term (current) use of antithrombotics/antiplatelets: Secondary | ICD-10-CM

## 2019-06-25 DIAGNOSIS — E875 Hyperkalemia: Secondary | ICD-10-CM | POA: Diagnosis not present

## 2019-06-25 DIAGNOSIS — M7989 Other specified soft tissue disorders: Secondary | ICD-10-CM | POA: Diagnosis present

## 2019-06-25 DIAGNOSIS — E11319 Type 2 diabetes mellitus with unspecified diabetic retinopathy without macular edema: Secondary | ICD-10-CM | POA: Diagnosis present

## 2019-06-25 DIAGNOSIS — Z794 Long term (current) use of insulin: Secondary | ICD-10-CM

## 2019-06-25 DIAGNOSIS — I130 Hypertensive heart and chronic kidney disease with heart failure and stage 1 through stage 4 chronic kidney disease, or unspecified chronic kidney disease: Secondary | ICD-10-CM | POA: Diagnosis not present

## 2019-06-25 DIAGNOSIS — Z8546 Personal history of malignant neoplasm of prostate: Secondary | ICD-10-CM

## 2019-06-25 DIAGNOSIS — I442 Atrioventricular block, complete: Secondary | ICD-10-CM | POA: Diagnosis present

## 2019-06-25 DIAGNOSIS — K219 Gastro-esophageal reflux disease without esophagitis: Secondary | ICD-10-CM | POA: Diagnosis present

## 2019-06-25 DIAGNOSIS — N184 Chronic kidney disease, stage 4 (severe): Secondary | ICD-10-CM

## 2019-06-25 DIAGNOSIS — I5031 Acute diastolic (congestive) heart failure: Secondary | ICD-10-CM

## 2019-06-25 DIAGNOSIS — Z961 Presence of intraocular lens: Secondary | ICD-10-CM | POA: Diagnosis present

## 2019-06-25 DIAGNOSIS — E1151 Type 2 diabetes mellitus with diabetic peripheral angiopathy without gangrene: Secondary | ICD-10-CM | POA: Diagnosis present

## 2019-06-25 DIAGNOSIS — E1165 Type 2 diabetes mellitus with hyperglycemia: Secondary | ICD-10-CM

## 2019-06-25 DIAGNOSIS — E782 Mixed hyperlipidemia: Secondary | ICD-10-CM | POA: Diagnosis present

## 2019-06-25 LAB — BASIC METABOLIC PANEL
Anion gap: 10 (ref 5–15)
BUN/Creatinine Ratio: 11 (ref 10–24)
BUN: 23 mg/dL (ref 8–27)
BUN: 19 mg/dL (ref 8–23)
CO2: 23 mmol/L (ref 20–29)
CO2: 24 mmol/L (ref 22–32)
Calcium: 9.3 mg/dL (ref 8.6–10.2)
Calcium: 8.7 mg/dL — ABNORMAL LOW (ref 8.9–10.3)
Chloride: 99 mmol/L (ref 96–106)
Chloride: 100 mmol/L (ref 98–111)
Creatinine, Ser: 2.06 mg/dL — ABNORMAL HIGH (ref 0.76–1.27)
Creatinine, Ser: 1.89 mg/dL — ABNORMAL HIGH (ref 0.61–1.24)
GFR calc Af Amer: 37 mL/min/{1.73_m2} — ABNORMAL LOW (ref >59)
GFR calc Af Amer: 41 mL/min — ABNORMAL LOW (ref >60)
GFR calc non Af Amer: 32 mL/min/{1.73_m2} — ABNORMAL LOW (ref >59)
GFR calc non Af Amer: 35 mL/min — ABNORMAL LOW (ref >60)
Glucose, Bld: 223 mg/dL — ABNORMAL HIGH (ref 70–99)
Glucose: 252 mg/dL — ABNORMAL HIGH (ref 65–99)
Potassium: 5.9 mmol/L (ref 3.5–5.2)
Potassium: 5.4 mmol/L — ABNORMAL HIGH (ref 3.5–5.1)
Sodium: 139 mmol/L (ref 134–144)
Sodium: 134 mmol/L — ABNORMAL LOW (ref 135–145)

## 2019-06-25 LAB — CBC
HCT: 30.5 % — ABNORMAL LOW (ref 39.0–52.0)
Hemoglobin: 9.5 g/dL — ABNORMAL LOW (ref 13.0–17.0)
MCH: 21.1 pg — ABNORMAL LOW (ref 26.0–34.0)
MCHC: 31.1 g/dL (ref 30.0–36.0)
MCV: 67.6 fL — ABNORMAL LOW (ref 80.0–100.0)
Platelets: 229 10*3/uL (ref 150–400)
RBC: 4.51 MIL/uL (ref 4.22–5.81)
RDW: 16 % — ABNORMAL HIGH (ref 11.5–15.5)
WBC: 4.7 10*3/uL (ref 4.0–10.5)
nRBC: 0 % (ref 0.0–0.2)

## 2019-06-25 LAB — CBG MONITORING, ED: Glucose-Capillary: 297 mg/dL — ABNORMAL HIGH (ref 70–99)

## 2019-06-25 LAB — BRAIN NATRIURETIC PEPTIDE
B Natriuretic Peptide: 294.1 pg/mL — ABNORMAL HIGH (ref 0.0–100.0)
BNP: 273.1 pg/mL — ABNORMAL HIGH (ref 0.0–100.0)

## 2019-06-25 LAB — MAGNESIUM: Magnesium: 2.1 mg/dL (ref 1.7–2.4)

## 2019-06-25 MED ORDER — ATORVASTATIN CALCIUM 10 MG PO TABS
20.0000 mg | ORAL_TABLET | Freq: Every morning | ORAL | Status: DC
  Administered 2019-06-26 – 2019-06-29 (×4): 20 mg via ORAL
  Filled 2019-06-25 (×4): qty 2

## 2019-06-25 MED ORDER — SODIUM CHLORIDE 0.9% FLUSH
3.0000 mL | INTRAVENOUS | Status: DC | PRN
  Administered 2019-06-26: 3 mL via INTRAVENOUS
  Filled 2019-06-25: qty 3

## 2019-06-25 MED ORDER — ACETAMINOPHEN 325 MG PO TABS: 650.0000 mg | ORAL_TABLET | Freq: Three times a day (TID) | ORAL | Status: DC | PRN

## 2019-06-25 MED ORDER — INSULIN ASPART 100 UNIT/ML ~~LOC~~ SOLN
0.0000 [IU] | Freq: Every day | SUBCUTANEOUS | Status: DC
  Administered 2019-06-26 – 2019-06-28 (×2): 4 [IU] via SUBCUTANEOUS

## 2019-06-25 MED ORDER — SODIUM ZIRCONIUM CYCLOSILICATE 10 G PO PACK
10.0000 g | PACK | Freq: Once | ORAL | Status: AC
  Administered 2019-06-25: 10 g via ORAL
  Filled 2019-06-25: qty 1

## 2019-06-25 MED ORDER — ONDANSETRON HCL 4 MG/2ML IJ SOLN: 4.0000 mg | Freq: Four times a day (QID) | INTRAMUSCULAR | Status: DC | PRN

## 2019-06-25 MED ORDER — FUROSEMIDE 10 MG/ML IJ SOLN
40.0000 mg | Freq: Two times a day (BID) | INTRAMUSCULAR | Status: DC
  Administered 2019-06-26 – 2019-06-28 (×5): 40 mg via INTRAVENOUS
  Filled 2019-06-25 (×5): qty 4

## 2019-06-25 MED ORDER — POTASSIUM CHLORIDE CRYS ER 20 MEQ PO TBCR: 20.0000 meq | EXTENDED_RELEASE_TABLET | Freq: Once | ORAL | Status: DC

## 2019-06-25 MED ORDER — SODIUM CHLORIDE 0.9 % IV SOLN: 250.0000 mL | INTRAVENOUS | Status: DC | PRN

## 2019-06-25 MED ORDER — INSULIN ASPART 100 UNIT/ML ~~LOC~~ SOLN
0.0000 [IU] | Freq: Three times a day (TID) | SUBCUTANEOUS | Status: DC
  Administered 2019-06-26: 2 [IU] via SUBCUTANEOUS
  Administered 2019-06-26: 3 [IU] via SUBCUTANEOUS
  Administered 2019-06-26: 2 [IU] via SUBCUTANEOUS
  Administered 2019-06-27 (×2): 1 [IU] via SUBCUTANEOUS
  Administered 2019-06-28 – 2019-06-29 (×2): 2 [IU] via SUBCUTANEOUS

## 2019-06-25 MED ORDER — ONDANSETRON HCL 4 MG PO TABS: 4.0000 mg | ORAL_TABLET | Freq: Four times a day (QID) | ORAL | Status: DC | PRN

## 2019-06-25 MED ORDER — SODIUM CHLORIDE 0.9% FLUSH
3.0000 mL | Freq: Two times a day (BID) | INTRAVENOUS | Status: DC
  Administered 2019-06-26 – 2019-06-29 (×7): 3 mL via INTRAVENOUS

## 2019-06-25 MED ORDER — ENOXAPARIN SODIUM 30 MG/0.3ML ~~LOC~~ SOLN
30.0000 mg | SUBCUTANEOUS | Status: DC
  Administered 2019-06-26 – 2019-06-28 (×3): 30 mg via SUBCUTANEOUS
  Filled 2019-06-25 (×3): qty 0.3

## 2019-06-25 MED ORDER — FUROSEMIDE 10 MG/ML IJ SOLN
40.0000 mg | Freq: Once | INTRAMUSCULAR | Status: AC
  Administered 2019-06-25: 40 mg via INTRAVENOUS
  Filled 2019-06-25: qty 4

## 2019-06-25 NOTE — ED Triage Notes (Signed)
Pt here from home called by his doctor for "too much fluid " on board , pt takes lasix po but MD wanted him to come and get IV lasix , pt in nad talking in full sentences bil legs are swollen

## 2019-06-25 NOTE — ED Notes (Signed)
Admitting paged regarding patient requesting cough medicine per RN request

## 2019-06-25 NOTE — ED Notes (Signed)
Pt requesting cough medicine, paged provider to relay request.

## 2019-06-25 NOTE — H&P (Signed)
[facility]                                                                                    Patient Demographics  Zong Mcquarrie, is a 68 y.o. male  CSN: [id number]  MRN: [id number]  DOB - 1950/09/03  Admit Date - 06/25/2019  Outpatient Primary MD for the patient is [name], MD   With History of -  Past Medical History:  Diagnosis Date  . Cellulitis and abscess of face 10/11/2007   Qualifier: Diagnosis of  By: Amil Amen MD, Benjamine Mola    . Diabetic retinopathy ([place])   . Diabetic retinopathy associated with type 2 diabetes mellitus ([place]) 09/13/2014   He works for [employer]  . Fluid overload 09/03/2012   Post op  . GERD (gastroesophageal reflux disease)   . Visual impairment       Past Surgical History:  Procedure Laterality Date  . ABDOMINAL AORTOGRAM W/LOWER EXTREMITY Left 05/30/2019   Procedure: ABDOMINAL AORTOGRAM W/LOWER EXTREMITY;  Surgeon: Marty Heck, MD;  Location: [facility];  Service: Cardiovascular;  Laterality: Left;  . CATARACT EXTRACTION W/ INTRAOCULAR LENS  IMPLANT, BILATERAL    . EYE SURGERY Bilateral    "laser OR for diabetic retinopathy"  . INGUINAL HERNIA REPAIR     [name] 07/12/2010), "don't remember which side"  . LAPAROTOMY  08/23/2012   Procedure: EXPLORATORY LAPAROTOMY;  Surgeon: [name], DO;  Location: [facility];  Service: General;  Laterality: N/A;  exploratory laparotomy with lysis of adhesions  . LYSIS OF ADHESION  08/23/2012   Procedure: LYSIS OF ADHESION;  Surgeon: [name], DO;  Location: [facility];  Service: General;;  . PERIPHERAL VASCULAR BALLOON ANGIOPLASTY  05/30/2019   Procedure: PERIPHERAL VASCULAR BALLOON ANGIOPLASTY;  Surgeon: Marty Heck, MD;  Location: [facility];  Service: Cardiovascular;;  left anterior tibial  . ROBOT ASSISTED LAPAROSCOPIC RADICAL PROSTATECTOMY  2000's   "had to finish manually after machine broke"  . SHOULDER SURGERY  1970's   separation;  from playing football"    in for   No chief complaint on file.    HPI  Daxen Lanum  is a 68 y.o. male, with past medical history significant for chronic renal failure, and diabetes mellitus type 2 who was sent by his cardiologist today to be evaluated for acute on chronic renal failure, hyperkalemia and worsening fluid overload.  He saw his cardiologist yesterday for bradycardia.  Patient reports increasing swelling especially in the lower extremities , and increasing shortness of breath.  No history of nausea, vomiting or diarrhea. Patient reports history of orthopnea, no fever no chills. In the emergency room the patient was noted to be hyperkalemic with a potassium 4 and his creatinine was 1.89.  He also complained of left foot ulcer and wanted to be checked while he is in the hospital.    Review of Systems    In addition to the HPI above,No Fever-chills, No Headache, No changes with Vision or hearing, No problems swallowing food or Liquids, No Chest pain, Cough , No Abdominal pain, No Nausea or Vommitting, Bowel movements are regular, No Blood in  stool or Urine, No dysuria, No new skin rashes or bruises, No new joints pains-aches,  No new weakness, tingling, numbness in any extremity, No recent weight gain or loss, No polyuria, polydypsia or polyphagia, No significant Mental Stressors.  All other systems were reviewed and were negative.   Social History Social History   Tobacco Use  . Smoking status: Former Smoker    Packs/day: 2.50    Years: 3.00    Pack years: 7.50    Types: Cigarettes    Quit date: 11/09/1966    Years since quitting: 52.6  . Smokeless tobacco: Never Used  Substance Use Topics  . Alcohol use: Yes    Comment: 04/08/2015 "maybe a beer/ or 2 or a glass of wine monthly"     Family History Family History  Problem Relation Age of Onset  . Diabetes Mellitus II Mother   . Colon cancer Neg Hx      Prior to Admission medications   Medication  Sig Start Date End Date Taking? Authorizing Provider  acetaminophen (TYLENOL) 650 MG CR tablet Take 650 mg by mouth every 8 (eight) hours as needed for pain.    [provider]  aspirin 81 MG chewable tablet Chew 81 mg by mouth every morning.     [provider]  atorvastatin (LIPITOR) 20 MG tablet Take 20 mg by mouth every morning.  09/27/18   [provider]  blood glucose meter kit and supplies KIT Dispense based on patient and insurance preference. Use up to four times daily as directed. (FOR ICD-9 250.00, 250.01). 10/24/18   Domenic Polite, MD  clopidogrel (PLAVIX) 75 MG tablet Take 1 tablet (75 mg total) by mouth daily with breakfast. 05/31/19   Ulyses Amor, PA-C  docusate sodium (COLACE) 100 MG capsule Take 100 mg by mouth daily as needed for mild constipation.    [provider]  furosemide (LASIX) 40 MG tablet Take 1 tablet (40 mg total) by mouth 2 (two) times daily. 06/21/19   [name], MD  gabapentin (NEURONTIN) 300 MG capsule TAKE 1 CAPSULE(300 MG) BY MOUTH THREE TIMES DAILY Patient taking differently: Take 300 mg by mouth 3 (three) times daily.  02/12/19   [name], MD  Insulin Isophane & Regular Human (HUMULIN 70/30 MIX) (70-30) 100 UNIT/ML PEN Inject 16-20 Units into the skin daily before breakfast AND 16-20 Units daily before supper. 05/13/19   Philemon Kingdom, MD  Insulin Pen Needle (PEN NEEDLES 31GX5/16") 31G X 8 MM MISC Use new needle with each insulin injection, twice a day. Dx E11.65, Z79.4 04/19/19   [name], MD  Lancets (FREESTYLE) lancets Use as instructed 09/18/14   Delfina Redwood, MD  Lancets 30G MISC  04/06/16   [provider]  losartan (COZAAR) 50 MG tablet Take 1 tablet (50 mg total) by mouth daily. 06/24/19   O'NealCassie Freer, MD  Multiple Vitamin (MULTIVITAMIN WITH MINERALS) TABS tablet Take 1 tablet by mouth every morning. Centrum    [provider]  mupirocin ointment (BACTROBAN) 2 %  Apply to left foot once daily 06/04/19 06/03/20  Landis Martins, DPM  Polyethyl Glycol-Propyl Glycol (SYSTANE OP) Place 1 drop into both eyes daily as needed (dry eyes).    [provider]  torsemide (DEMADEX) 100 MG tablet Take 1/2 pill of torsemide daily. 06/24/19   O'Neal, Cassie Freer, MD    Allergies  Allergen Reactions  . Ace Inhibitors Itching and Cough  . Naproxen Anaphylaxis, Shortness Of  Breath and Other (See Comments)    Throat swells. cannot breathe, and causes GI distress    Physical Exam  Vitals  Blood pressure (!) 185/70, pulse 70, temperature 98.6 F (37 C), resp. rate 18, SpO2 100 %.   General appearance, no acute distress, alert awake oriented x3, very pleasant HEENT no jaundice or pallor, no facial deviation oral thrush Neck supple, no neck vein distention Chest decreased breath sounds at the bases Heart normal S1-S2, no murmurs gallops or rubs Abdomen soft, nontender, bowel sounds present Extremities +2 edema especially in the lower extremities with left foot ulcer noted Skin no rashes or ulcers other than than the left foot ulcer  Data Review  CBC Recent Labs  Lab 06/25/19 1038  WBC 4.7  HGB 9.5*  HCT 30.5*  PLT 229  MCV 67.6*  MCH 21.1*  MCHC 31.1  RDW 16.0*   ------------------------------------------------------------------------------------------------------------------  Chemistries  Recent Labs  Lab 06/24/19 1200 06/25/19 1038  NA 139 134*  K 5.9* 5.4*  CL 99 100  CO2 23 24  GLUCOSE 252* 223*  BUN 23 19  CREATININE 2.06* 1.89*  CALCIUM 9.3 8.7*  MG  --  2.1   ------------------------------------------------------------------------------------------------------------------ estimated creatinine clearance is 36.5 mL/min (A) (by C-G formula based on SCr of 1.89 mg/dL (H)). ------------------------------------------------------------------------------------------------------------------ No results for input(s): TSH,  T4TOTAL, T3FREE, THYROIDAB in the last 72 hours.  Invalid input(s): FREET3   Coagulation profile No results for input(s): INR, PROTIME in the last 168 hours. ------------------------------------------------------------------------------------------------------------------- No results for input(s): DDIMER in the last 72 hours. -------------------------------------------------------------------------------------------------------------------  Cardiac Enzymes No results for input(s): CKMB, TROPONINI, MYOGLOBIN in the last 168 hours.  Invalid input(s): CK ------------------------------------------------------------------------------------------------------------------ Invalid input(s): POCBNP   ---------------------------------------------------------------------------------------------------------------  Urinalysis    Component Value Date/Time   COLORURINE YELLOW 09/03/2015 1505   APPEARANCEUR CLEAR 09/03/2015 1505   LABSPEC 1.035 (H) 09/03/2015 1505   PHURINE 6.0 09/03/2015 1505   GLUCOSEU >1000 (A) 09/03/2015 1505   HGBUR NEGATIVE 09/03/2015 1505   HGBUR negative 08/19/2008 1530   BILIRUBINUR NEGATIVE 09/03/2015 1505   KETONESUR NEGATIVE 09/03/2015 1505   PROTEINUR NEGATIVE 09/03/2015 1505   UROBILINOGEN 0.2 04/08/2015 0456   NITRITE NEGATIVE 09/03/2015 1505   LEUKOCYTESUR NEGATIVE 09/03/2015 1505    ----------------------------------------------------------------------------------------------------------------   Imaging results:   Dg Chest 2 View  Result Date: 06/25/2019 CLINICAL DATA:  CHF EXAM: CHEST - 2 VIEW COMPARISON:  May 09, 2019 FINDINGS: Small bilateral pleural effusions, right greater than left. Associated basilar atelectasis. Normal heart size. No acute osseous abnormality. IMPRESSION: Small bilateral pleural effusions and associated basilar atelectasis. Electronically Signed   By: Macy Mis M.D.   On: 06/25/2019 10:55   Dg Foot Complete  Left  Result Date: 06/04/2019 Please see detailed radiograph report in office note.   My personal review of EKG: Rhythm NSR, at 70 bpm with first-degree AV block and LVH    Assessment & Plan  Acute on chronic renal failure with anasarca Place under observation Hold losartan We will start slow diuresis with IV Lasix and monitor creatinine in a.m. Consider nephrology consult in a.m.  Hyperkalemia Continue with Lasix for now  Left foot ulcer Wound care team to check  DVT Prophylaxis Lovenox  AM Labs Ordered, also please review Full Orders    Code Status full  Disposition Plan: Home  Time spent in minutes : 43 minutes  Condition GUARDED   _0 @

## 2019-06-25 NOTE — Telephone Encounter (Signed)
Thanks, I got in touch with him and gonna get him to the ER for evaluation and likely admission. -W

## 2019-06-25 NOTE — Telephone Encounter (Signed)
Called patient to schedule a new patient appointment and was not able to reach him. His voice mail is full.

## 2019-06-25 NOTE — Telephone Encounter (Signed)
Cardiology Moonlighter Note  Contacted by [facility] for critical lab. K 5.9. Will forward this note to ordering provider Dr. Audie Box to determine next steps.   Marcie Mowers, MD Cardiology Fellow, PGY-7

## 2019-06-25 NOTE — Telephone Encounter (Signed)
Cardiology Moonlighter Note  Contacted by [facility] for critical lab. K 5.9. On review of patient's chart, it looks like he was recommended to restart losartan today. I contacted patient by phone, instructed him NOT to restart losartan. Ok to continue all other medications as prescribed. For some reason the remainder of his BMP results are not uploaded into Epic so I cannot see whether he has AKI or any other lab abnormalities. Recommended patient avoid potassium containing foods (discussed a list of these foods with the patient). Will forward this note to ordering provider Dr. Audie Box to determine next steps.   Marcie Mowers, MD Cardiology Fellow, PGY-7

## 2019-06-25 NOTE — ED Provider Notes (Signed)
[facility] EMERGENCY DEPARTMENT Provider Note   CSN: [id number] Arrival date & time: 06/25/19  4650     History   Chief Complaint No chief complaint on file.   HPI [name] is a 68 y.o. male.     HPI   He presents for evaluation of lower extremity swelling, hyperkalemia, and suspected fluid overload.  He saw his cardiologist yesterday to evaluate for an episode of bradycardia that was associated with a obstruction vomiting, about a month ago.  No prior diagnosis of cardiac disease.  Unknown history for congestive heart failure.  Known chronic renal insufficiency, diabetes and hypertension.  He has been taking his Lasix intermittently, up until yesterday.  At that point the cardiologist wanted him to start torsemide and losartan.  He was contacted this morning told not to take losartan because of hyperkalemia, and sent here for evaluation.  He reports dyspnea on exertion, and dyspnea when supine.  He denies chest pain, focal weakness or paresthesia.  He has been eating well.  He feels like he is about 10 pounds heavier than he should be.  There are no other known modifying factors.   Past Medical History:  Diagnosis Date  . Cellulitis and abscess of face 10/11/2007   Qualifier: Diagnosis of  By: Amil Amen MD, Benjamine Mola    . Diabetic retinopathy ([place])   . Diabetic retinopathy associated with type 2 diabetes mellitus ([place]) 09/13/2014   He works for [employer]  . Fluid overload 09/03/2012   Post op  . GERD (gastroesophageal reflux disease)   . Visual impairment     Patient Active Problem List   Diagnosis Date Noted  . H/O small bowel obstruction 05/17/2019  . PAD (peripheral artery disease) ([place]) 05/14/2019  . Ulcer of left foot due to type 2 diabetes mellitus ([place])   . Skin ulcer of left foot, limited to breakdown of skin ([place])   . Charcot foot due to diabetes mellitus ([place])   . Charcot's joint of foot, left   . SBO (small bowel  obstruction) ([place]) 05/06/2019  . Complete heart block ([place]) 05/06/2019  . Moderate protein-calorie malnutrition ([place])   . Diabetic foot ulcer ([place]) 10/19/2018  . Diabetic foot infection ([place])   . Pseudophakia, both eyes 10/04/2018  . Stage 3 chronic kidney disease 09/27/2018  . Adrenal insufficiency ([place]) 04/09/2015  . Peripheral edema 09/17/2014  . Shortness of breath 09/17/2014  . Bronchospasm, acute 09/16/2014  . Partial small bowel obstruction ([place]) 09/16/2014  . Hyperglycemia   . Diabetic retinopathy associated with type 2 diabetes mellitus ([place]) 09/13/2014  . Abdominal pain 09/13/2014  . Cough 11/15/2012  . Pharyngitis 09/14/2012  . Fluid overload 09/03/2012  . Ileus following gastrointestinal surgery ([place]) 09/03/2012  . Hypokalemia 08/30/2012  . Small bowel obstruction s/p LOA [id number] 08/21/2012  . Abnormal EKG 08/21/2012  . Chest pain 08/21/2012  . Macular degeneration (senile) of retina 12/15/2011  . Lens replaced 12/15/2011  . Vitreous hemorrhage ([place]) 12/15/2011  . Essential hypertension 08/19/2008  . Regional enteritis of small intestine ([place]) 01/03/2008  . ERECTILE DYSFUNCTION, ORGANIC 01/03/2008  . Sarcoidosis 07/19/2007  . Diabetes mellitus ([place]) 07/19/2007  . HYPERLIPIDEMIA, MIXED 07/19/2007  . CERUMEN IMPACTION, BILATERAL 07/19/2007  . URINARY INCONTINENCE, STRESS, MALE 08/17/2006  . PROSTATE CANCER, HX OF 08/17/2006  . DIABETIC  RETINOPATHY 01/09/2002    Past Surgical History:  Procedure Laterality Date  . ABDOMINAL AORTOGRAM W/LOWER EXTREMITY Left 05/30/2019   Procedure: ABDOMINAL AORTOGRAM W/LOWER EXTREMITY;  Surgeon: Marty Heck, MD;  Location: [facility];  Service: Cardiovascular;  Laterality: Left;  . CATARACT EXTRACTION W/ INTRAOCULAR LENS  IMPLANT, BILATERAL    . EYE SURGERY Bilateral    "laser OR for diabetic retinopathy"  . INGUINAL HERNIA REPAIR     [name] 07/12/2010), "don't remember which side"  . LAPAROTOMY  08/23/2012    Procedure: EXPLORATORY LAPAROTOMY;  Surgeon: [name], DO;  Location: [facility];  Service: General;  Laterality: N/A;  exploratory laparotomy with lysis of adhesions  . LYSIS OF ADHESION  08/23/2012   Procedure: LYSIS OF ADHESION;  Surgeon: [name], DO;  Location: [facility];  Service: General;;  . PERIPHERAL VASCULAR BALLOON ANGIOPLASTY  05/30/2019   Procedure: PERIPHERAL VASCULAR BALLOON ANGIOPLASTY;  Surgeon: Marty Heck, MD;  Location: [facility];  Service: Cardiovascular;;  left anterior tibial  . ROBOT ASSISTED LAPAROSCOPIC RADICAL PROSTATECTOMY  2000's   "had to finish manually after machine broke"  . SHOULDER SURGERY  1970's   separation; from playing football"        Home Medications    Prior to Admission medications   Medication Sig Start Date End Date Taking? Authorizing Provider  acetaminophen (TYLENOL) 650 MG CR tablet Take 650 mg by mouth every 8 (eight) hours as needed for pain.    [provider]  aspirin 81 MG chewable tablet Chew 81 mg by mouth every morning.     [provider]  atorvastatin (LIPITOR) 20 MG tablet Take 20 mg by mouth every morning.  09/27/18   [provider]  blood glucose meter kit and supplies KIT Dispense based on patient and insurance preference. Use up to four times daily as directed. (FOR ICD-9 250.00, 250.01). 10/24/18   Domenic Polite, MD  clopidogrel (PLAVIX) 75 MG tablet Take 1 tablet (75 mg total) by mouth daily with breakfast. 05/31/19   Ulyses Amor, PA-C  docusate sodium (COLACE) 100 MG capsule Take 100 mg by mouth daily as needed for mild constipation.    [provider]  furosemide (LASIX) 40 MG tablet Take 1 tablet (40 mg total) by mouth 2 (two) times daily. 06/21/19   [name], MD  gabapentin (NEURONTIN) 300 MG capsule TAKE 1 CAPSULE(300 MG) BY MOUTH THREE TIMES DAILY Patient taking differently: Take 300 mg by mouth 3 (three) times daily.  02/12/19   [name], MD   Insulin Isophane & Regular [name] (HUMULIN 70/30 MIX) (70-30) 100 UNIT/ML PEN Inject 16-20 Units into the skin daily before breakfast AND 16-20 Units daily before supper. 05/13/19   Philemon Kingdom, MD  Insulin Pen Needle (PEN NEEDLES 31GX5/16") 31G X 8 MM MISC Use new needle with each insulin injection, twice a day. Dx E11.65, Z79.4 04/19/19   [name], MD  Lancets (FREESTYLE) lancets Use as instructed 09/18/14   Delfina Redwood, MD  Lancets 30G MISC  04/06/16   [provider]  losartan (COZAAR) 50 MG tablet Take 1 tablet (50 mg total) by mouth daily. 06/24/19   O'NealCassie Freer, MD  Multiple Vitamin (MULTIVITAMIN WITH MINERALS) TABS tablet Take 1 tablet by mouth every morning. Centrum    [provider]  mupirocin ointment (BACTROBAN) 2 % Apply to left foot once daily 06/04/19 06/03/20  Landis Martins, DPM  Polyethyl Glycol-Propyl Glycol (SYSTANE OP) Place 1 drop into both eyes daily as needed (dry eyes).    [provider]  torsemide (DEMADEX) 100 MG tablet Take 1/2 pill of  torsemide daily. 06/24/19   O'Neal, Cassie Freer, MD    Family History Family History  Problem Relation Age of Onset  . Diabetes Mellitus II Mother   . Colon cancer Neg Hx     Social History Social History   Tobacco Use  . Smoking status: Former Smoker    Packs/day: 2.50    Years: 3.00    Pack years: 7.50    Types: Cigarettes    Quit date: 11/09/1966    Years since quitting: 52.6  . Smokeless tobacco: Never Used  Substance Use Topics  . Alcohol use: Yes    Comment: 04/08/2015 "maybe a beer/ or 2 or a glass of wine monthly"  . Drug use: No     Allergies   Ace inhibitors and Naproxen   Review of Systems Review of Systems  All other systems reviewed and are negative.    Physical Exam Updated Vital Signs BP (!) 185/70   Pulse 70   Temp 98.6 F (37 C)   Resp 18   SpO2 100%   Physical Exam Vitals signs and nursing note reviewed.  Constitutional:       General: He is not in acute distress.    Appearance: He is well-developed. He is not ill-appearing, toxic-appearing or diaphoretic.  HENT:     Head: Normocephalic and atraumatic.     Right Ear: External ear normal.     Left Ear: External ear normal.  Eyes:     Conjunctiva/sclera: Conjunctivae normal.     Pupils: Pupils are equal, round, and reactive to light.  Neck:     Musculoskeletal: Normal range of motion and neck supple.     Trachea: Phonation normal.  Cardiovascular:     Rate and Rhythm: Normal rate and regular rhythm.     Heart sounds: Normal heart sounds.  Pulmonary:     Effort: Pulmonary effort is normal.     Breath sounds: Normal breath sounds.  Abdominal:     Palpations: Abdomen is soft.     Tenderness: There is no abdominal tenderness.  Musculoskeletal: Normal range of motion.     Comments: Bilateral lower leg swelling, firm, appearing chronic.  Skin:    General: Skin is warm and dry.  Neurological:     Mental Status: He is alert and oriented to person, place, and time.     Cranial Nerves: No cranial nerve deficit.     Sensory: No sensory deficit.     Motor: No abnormal muscle tone.     Coordination: Coordination normal.  Psychiatric:        Mood and Affect: Mood normal.        Behavior: Behavior normal.        Thought Content: Thought content normal.        Judgment: Judgment normal.      ED Treatments / Results  Labs (all labs ordered are listed, but only abnormal results are displayed) Labs Reviewed  BASIC METABOLIC PANEL - Abnormal; Notable for the following components:      Result Value   Sodium 134 (*)    Potassium 5.4 (*)    Glucose, Bld 223 (*)    Creatinine, Ser 1.89 (*)    Calcium 8.7 (*)    GFR calc non Af Amer 35 (*)    GFR calc Af Amer 41 (*)    All other components within normal limits  BRAIN NATRIURETIC PEPTIDE - Abnormal; Notable for the following components:   B Natriuretic Peptide 294.1 (*)  All other components within normal  limits  CBC - Abnormal; Notable for the following components:   Hemoglobin 9.5 (*)    HCT 30.5 (*)    MCV 67.6 (*)    MCH 21.1 (*)    RDW 16.0 (*)    All other components within normal limits  SARS CORONAVIRUS 2 (TAT 6-24 HRS)  MAGNESIUM    EKG EKG Interpretation  Date/Time:  Tuesday June 25 2019 10:35:24 EST Ventricular Rate:  70 PR Interval:  262 QRS Duration: 94 QT Interval:  410 QTC Calculation: 442 R Axis:   51 Text Interpretation: Sinus rhythm with 1st degree A-V block Left ventricular hypertrophy with repolarization abnormality ( Sokolow-Lyon ) Abnormal ECG since last tracing no significant change Confirmed by Daleen Bo [phone]) on 06/25/2019 4:43:25 PM   Radiology Dg Chest 2 View  Result Date: 06/25/2019 CLINICAL DATA:  CHF EXAM: CHEST - 2 VIEW COMPARISON:  May 09, 2019 FINDINGS: Small bilateral pleural effusions, right greater than left. Associated basilar atelectasis. Normal heart size. No acute osseous abnormality. IMPRESSION: Small bilateral pleural effusions and associated basilar atelectasis. Electronically Signed   By: Macy Mis M.D.   On: 06/25/2019 10:55    Procedures .Critical Care Performed by: Daleen Bo, MD Authorized by: Daleen Bo, MD   Critical care provider statement:    Critical care time (minutes):  35   Critical care start time:  06/25/2019 4:40 PM   Critical care end time:  06/25/2019 5:21 PM   Critical care time was exclusive of:  Separately billable procedures and treating other patients   Critical care was necessary to treat or prevent imminent or life-threatening deterioration of the following conditions:  Metabolic crisis   Critical care was time spent personally by me on the following activities:  Blood draw for specimens, development of treatment plan with patient or surrogate, discussions with consultants, evaluation of patient's response to treatment, examination of patient, obtaining history from patient or  surrogate, ordering and performing treatments and interventions, ordering and review of laboratory studies, pulse oximetry, re-evaluation of patient's condition, review of old charts and ordering and review of radiographic studies   (including critical care time)  Medications Ordered in ED Medications  furosemide (LASIX) injection 40 mg (has no administration in time range)  sodium zirconium cyclosilicate (LOKELMA) packet 10 g (has no administration in time range)     Initial Impression / Assessment and Plan / ED Course  I have reviewed the triage vital signs and the nursing notes.  Pertinent labs & imaging results that were available during my care of the patient were reviewed by me and considered in my medical decision making (see chart for details).  Clinical Course as of Jun 24 1722  Tue Jun 25, 2019  1641 Elevated  Brain natriuretic peptide (order ONLY if patient c/o SOB)(!) [EW]  1642 Normal  Magnesium [EW]  1642 Normal except sodium low, potassium high, glucose high, creatinine high, calcium low, GFR low   [EW]  1642 Normal except hemoglobin low, MCV low  CBC(!) [EW]  1642 Abnormal, bilateral pleural effusions small, interpreted by me  DG Chest 2 View [EW]    Clinical Course User Index [EW] Daleen Bo, MD        Patient Vitals for the past 24 hrs:  BP Temp Pulse Resp SpO2  06/25/19 1615 (!) 185/70 - 70 18 100 %  06/25/19 1026 (!) 164/82 - - - -  06/25/19 1022 - 98.6 F (37 C) 76 18 98 %  5:10 PM Reevaluation with update and discussion. After initial assessment and treatment, an updated evaluation reveals no change in status. Findings discussed and questions answered. Daleen Bo   Medical Decision Making: Patient with chronic leg swelling, worsening now with elevated creatinine and potassium.  He has been taking his diuretic medication sporadically.  He was seen by cardiology yesterday, labs checked and was sent here when the potassium returned abnormal.   Cardiology request that he be admitted for diuresis, and obtain cardiac echo.  Doubt ACS, PE or pneumonia.  He does have small bilateral pleural effusions.  He will require admission for gentle hydration and treatment of hyperkalemia.  CRITICAL CARE-yes Performed by: Daleen Bo  Nursing Notes Reviewed/ Care Coordinated Applicable Imaging Reviewed Interpretation of Laboratory Data incorporated into ED treatment  5:11 PM-Consult complete with hospitalist. Patient case explained and discussed.  He agrees to admit patient for further evaluation and treatment. Call ended at 5:15 PM  Final Clinical Impressions(s) / ED Diagnoses   Final diagnoses:  Hyperkalemia  Renal insufficiency  Hypervolemia, unspecified hypervolemia type    ED Discharge Orders    None       Daleen Bo, MD 06/25/19 1723

## 2019-06-25 NOTE — Telephone Encounter (Signed)
Cardiology [facility] Note

## 2019-06-25 NOTE — Telephone Encounter (Signed)
Called [name] this morning about critical K of 5.9. I have instructed him to not take his losartan. His Cr is elevated as well from his baseline. Due to the AKI and hyperkalemia, I have recommended that he go to the emergency room for further evaluation. He was significantly volume overloaded on my exam yesterday and our initial plan to start him on torsemide as an outpatient will not appear to work with worsening kidney function. I think he will need to be admitted for IV diuresis and close monitoring of his kidney function.   I have recommended he go the emergency room for repeat labs, EKG, and likely admission for IV diuretic therapy. Will also like for him to get an echo this admission. He was in agreement with this plan.   [place] T. Audie Box, [place]  [address] [place], [place] [address] [phone]  6:58 AM

## 2019-06-25 NOTE — ED Notes (Signed)
Dinner Tray Ordered @ [phone].

## 2019-06-26 ENCOUNTER — Other Ambulatory Visit: Payer: Self-pay

## 2019-06-26 DIAGNOSIS — I442 Atrioventricular block, complete: Secondary | ICD-10-CM | POA: Diagnosis present

## 2019-06-26 DIAGNOSIS — Z79899 Other long term (current) drug therapy: Secondary | ICD-10-CM | POA: Diagnosis not present

## 2019-06-26 DIAGNOSIS — N184 Chronic kidney disease, stage 4 (severe): Secondary | ICD-10-CM

## 2019-06-26 DIAGNOSIS — E1165 Type 2 diabetes mellitus with hyperglycemia: Secondary | ICD-10-CM | POA: Diagnosis present

## 2019-06-26 DIAGNOSIS — I5033 Acute on chronic diastolic (congestive) heart failure: Secondary | ICD-10-CM

## 2019-06-26 DIAGNOSIS — I739 Peripheral vascular disease, unspecified: Secondary | ICD-10-CM

## 2019-06-26 DIAGNOSIS — Z7982 Long term (current) use of aspirin: Secondary | ICD-10-CM | POA: Diagnosis not present

## 2019-06-26 DIAGNOSIS — I5031 Acute diastolic (congestive) heart failure: Secondary | ICD-10-CM

## 2019-06-26 DIAGNOSIS — E1122 Type 2 diabetes mellitus with diabetic chronic kidney disease: Secondary | ICD-10-CM | POA: Diagnosis present

## 2019-06-26 DIAGNOSIS — N289 Disorder of kidney and ureter, unspecified: Secondary | ICD-10-CM

## 2019-06-26 DIAGNOSIS — E8770 Fluid overload, unspecified: Secondary | ICD-10-CM | POA: Diagnosis not present

## 2019-06-26 DIAGNOSIS — Z9114 Patient's other noncompliance with medication regimen: Secondary | ICD-10-CM | POA: Diagnosis not present

## 2019-06-26 DIAGNOSIS — K4090 Unilateral inguinal hernia, without obstruction or gangrene, not specified as recurrent: Secondary | ICD-10-CM | POA: Diagnosis present

## 2019-06-26 DIAGNOSIS — Z20828 Contact with and (suspected) exposure to other viral communicable diseases: Secondary | ICD-10-CM | POA: Diagnosis present

## 2019-06-26 DIAGNOSIS — L97529 Non-pressure chronic ulcer of other part of left foot with unspecified severity: Secondary | ICD-10-CM | POA: Diagnosis present

## 2019-06-26 DIAGNOSIS — E11621 Type 2 diabetes mellitus with foot ulcer: Secondary | ICD-10-CM | POA: Diagnosis present

## 2019-06-26 DIAGNOSIS — N170 Acute kidney failure with tubular necrosis: Secondary | ICD-10-CM | POA: Diagnosis present

## 2019-06-26 DIAGNOSIS — I130 Hypertensive heart and chronic kidney disease with heart failure and stage 1 through stage 4 chronic kidney disease, or unspecified chronic kidney disease: Secondary | ICD-10-CM | POA: Diagnosis present

## 2019-06-26 DIAGNOSIS — E782 Mixed hyperlipidemia: Secondary | ICD-10-CM | POA: Diagnosis present

## 2019-06-26 DIAGNOSIS — Z7902 Long term (current) use of antithrombotics/antiplatelets: Secondary | ICD-10-CM | POA: Diagnosis not present

## 2019-06-26 DIAGNOSIS — E11319 Type 2 diabetes mellitus with unspecified diabetic retinopathy without macular edema: Secondary | ICD-10-CM | POA: Diagnosis present

## 2019-06-26 DIAGNOSIS — K219 Gastro-esophageal reflux disease without esophagitis: Secondary | ICD-10-CM | POA: Diagnosis present

## 2019-06-26 DIAGNOSIS — E1151 Type 2 diabetes mellitus with diabetic peripheral angiopathy without gangrene: Secondary | ICD-10-CM | POA: Diagnosis present

## 2019-06-26 DIAGNOSIS — H547 Unspecified visual loss: Secondary | ICD-10-CM | POA: Diagnosis present

## 2019-06-26 DIAGNOSIS — Z794 Long term (current) use of insulin: Secondary | ICD-10-CM | POA: Diagnosis not present

## 2019-06-26 DIAGNOSIS — D509 Iron deficiency anemia, unspecified: Secondary | ICD-10-CM | POA: Diagnosis present

## 2019-06-26 DIAGNOSIS — M7989 Other specified soft tissue disorders: Secondary | ICD-10-CM | POA: Diagnosis present

## 2019-06-26 DIAGNOSIS — E875 Hyperkalemia: Secondary | ICD-10-CM | POA: Diagnosis present

## 2019-06-26 LAB — CBC
HCT: 28.7 % — ABNORMAL LOW (ref 39.0–52.0)
Hemoglobin: 8.8 g/dL — ABNORMAL LOW (ref 13.0–17.0)
MCH: 20.4 pg — ABNORMAL LOW (ref 26.0–34.0)
MCHC: 30.7 g/dL (ref 30.0–36.0)
MCV: 66.4 fL — ABNORMAL LOW (ref 80.0–100.0)
Platelets: 224 10*3/uL (ref 150–400)
RBC: 4.32 MIL/uL (ref 4.22–5.81)
RDW: 15.8 % — ABNORMAL HIGH (ref 11.5–15.5)
WBC: 2.9 10*3/uL — ABNORMAL LOW (ref 4.0–10.5)
nRBC: 0 % (ref 0.0–0.2)

## 2019-06-26 LAB — BASIC METABOLIC PANEL
Anion gap: 8 (ref 5–15)
BUN: 17 mg/dL (ref 8–23)
CO2: 27 mmol/L (ref 22–32)
Calcium: 8.6 mg/dL — ABNORMAL LOW (ref 8.9–10.3)
Chloride: 103 mmol/L (ref 98–111)
Creatinine, Ser: 1.89 mg/dL — ABNORMAL HIGH (ref 0.61–1.24)
GFR calc Af Amer: 41 mL/min — ABNORMAL LOW (ref >60)
GFR calc non Af Amer: 35 mL/min — ABNORMAL LOW (ref >60)
Glucose, Bld: 208 mg/dL — ABNORMAL HIGH (ref 70–99)
Potassium: 3.8 mmol/L (ref 3.5–5.1)
Sodium: 138 mmol/L (ref 135–145)

## 2019-06-26 LAB — GLUCOSE, CAPILLARY
Glucose-Capillary: 336 mg/dL — ABNORMAL HIGH (ref 70–99)
Glucose-Capillary: 169 mg/dL — ABNORMAL HIGH (ref 70–99)
Glucose-Capillary: 180 mg/dL — ABNORMAL HIGH (ref 70–99)
Glucose-Capillary: 224 mg/dL — ABNORMAL HIGH (ref 70–99)
Glucose-Capillary: 167 mg/dL — ABNORMAL HIGH (ref 70–99)

## 2019-06-26 LAB — SARS CORONAVIRUS 2 (TAT 6-24 HRS): SARS Coronavirus 2: NEGATIVE

## 2019-06-26 MED ORDER — MUPIROCIN CALCIUM 2 % EX CREA
TOPICAL_CREAM | Freq: Every day | CUTANEOUS | Status: DC
  Administered 2019-06-26 – 2019-06-29 (×4): via TOPICAL
  Filled 2019-06-26 (×3): qty 15

## 2019-06-26 MED ORDER — SODIUM ZIRCONIUM CYCLOSILICATE 10 G PO PACK
10.0000 g | PACK | Freq: Once | ORAL | Status: AC
  Administered 2019-06-26: 10 g via ORAL
  Filled 2019-06-26: qty 1

## 2019-06-26 MED ORDER — ADULT MULTIVITAMIN W/MINERALS CH
1.0000 | ORAL_TABLET | Freq: Every day | ORAL | Status: DC
  Administered 2019-06-26 – 2019-06-29 (×4): 1 via ORAL
  Filled 2019-06-26 (×4): qty 1

## 2019-06-26 MED ORDER — CLOPIDOGREL BISULFATE 75 MG PO TABS
75.0000 mg | ORAL_TABLET | Freq: Every day | ORAL | Status: DC
  Administered 2019-06-26 – 2019-06-29 (×4): 75 mg via ORAL
  Filled 2019-06-26 (×4): qty 1

## 2019-06-26 MED ORDER — ASPIRIN EC 81 MG PO TBEC
81.0000 mg | DELAYED_RELEASE_TABLET | Freq: Every day | ORAL | Status: DC
  Administered 2019-06-26 – 2019-06-29 (×4): 81 mg via ORAL
  Filled 2019-06-26 (×4): qty 1

## 2019-06-26 MED ORDER — PRO-STAT SUGAR FREE PO LIQD
30.0000 mL | Freq: Two times a day (BID) | ORAL | Status: DC
  Administered 2019-06-26 – 2019-06-29 (×6): 30 mL via ORAL
  Filled 2019-06-26 (×6): qty 30

## 2019-06-26 MED ORDER — INSULIN GLARGINE 100 UNIT/ML ~~LOC~~ SOLN
20.0000 [IU] | Freq: Every day | SUBCUTANEOUS | Status: DC
  Administered 2019-06-26 – 2019-06-27 (×2): 20 [IU] via SUBCUTANEOUS
  Filled 2019-06-26 (×2): qty 0.2

## 2019-06-26 MED ORDER — GUAIFENESIN ER 600 MG PO TB12: 600.0000 mg | ORAL_TABLET | Freq: Two times a day (BID) | ORAL | Status: DC | PRN

## 2019-06-26 MED ORDER — GUAIFENESIN-DM 100-10 MG/5ML PO SYRP
5.0000 mL | ORAL_SOLUTION | ORAL | Status: DC | PRN
  Administered 2019-06-26: 5 mL via ORAL
  Filled 2019-06-26: qty 5

## 2019-06-26 NOTE — Consult Note (Signed)
[facility] Nurse wound consult note  Reason for Consult: Consult requested for left foot.  Pt has a chronic full thickness wound which is followed as an outpatient by the [facility] ankle center.  They have ordered Bactroban daily and perform seriall debridements to remove callous areas.  Measurement: 7.3X6cm raised dark red blistered area to left plantar foot.  Dry callous in the center 3X3cm.   Drainage (amount, consistency, odor)  No odor or drainage Periwound: intact skin surrounding Dressing procedure/placement/frequency:Topical treatment orders provided for bedside nurses to perform as follows:  Apply Bactroban to left foot Q day, then cover with foam dressing.  Pt should follow-up with his previous physician after discharge. Please re-consult if further assistance is needed.  Thank-you,  Julien Girt MSN, [place], [place], [place], [place]

## 2019-06-26 NOTE — Progress Notes (Signed)
Orthopedic Tech Progress Note Patient Details:  [name] August 12, 1951 [id number]  Ortho Devices Type of Ortho Device: Postop shoe/boot Ortho Device/Splint Location: left foot Ortho Device/Splint Interventions: Ordered       Staci Righter 06/26/2019, 10:19 AM

## 2019-06-26 NOTE — Consult Note (Addendum)
Cardiology Consultation:   Patient ID: [name] MRN: [id number]; DOB: 1951/01/03  Admit date: 06/25/2019 Date of Consult: 06/26/2019  Primary Care Provider: [name], MD Primary Cardiologist: Evalina Field, MD  Patient Profile:   [name] is a 68 y.o. male with a hx of transient CHB, HTN, HLD, CKD III, PAD s/p left anterior tibial angioplasty 05/30/2019 (followed by vascular), DM and prostate cancer who is being seen today for the evaluation of CHF  at the request of Dr. Broadus John.   Admitted 04/2019 with SOB. The Cardiology consult team ordered a 30 day event monitor. The initial episode was thought to have been vagally mediated given its resolution without intervention or pacemaker therapy. Review of his EKGs from 9/20-9/21 by Dr. Audie Box "show possible CHB vs Wenkebach. He has a very long PR interval at baseline (~350 msec) and given the recovery this could just be Wenkebach. The escape rhythm is not regular, further suggesting this was just Wenkebach".  History of Present Illness:   [name] was evaluated by Dr.O'Neal 06/24/2019 for follow up. Reported he never got 30-day event monitor due to family issue.  He was noted severely volume overloaded with 2+ lower extremity edema with weight gain. Felt due to dietary noncompliance.  Plan was to start torsemide and losartan however lab work came abnormal with potassium of 5.9 and creatinine 2.06.  He was instructed to hold losartan and advised to come to ER for IV diuresis yesterday.  BNP 294. K was 5.4 yesterday with SCr of 1.89. Pending BMET today. Started on IV lasix 40mg  BID (got 2 dose so far). No I & O or daily recorded. BP was elevated yesterday, last reading 149/60.  Seems poor insight of his health condition.  No regular daily weight.  No sodium regulation in his diet.  Reports improved breathing since admit.  Heart Pathway Score:     Past Medical History:  Diagnosis Date  . Cellulitis and abscess of face  10/11/2007   Qualifier: Diagnosis of  By: Amil Amen MD, Benjamine Mola    . Diabetic retinopathy ([place])   . Diabetic retinopathy associated with type 2 diabetes mellitus ([place]) 09/13/2014   He works for [employer]  . Fluid overload 09/03/2012   Post op  . GERD (gastroesophageal reflux disease)   . Visual impairment     Past Surgical History:  Procedure Laterality Date  . ABDOMINAL AORTOGRAM W/LOWER EXTREMITY Left 05/30/2019   Procedure: ABDOMINAL AORTOGRAM W/LOWER EXTREMITY;  Surgeon: Marty Heck, MD;  Location: [facility];  Service: Cardiovascular;  Laterality: Left;  . CATARACT EXTRACTION W/ INTRAOCULAR LENS  IMPLANT, BILATERAL    . EYE SURGERY Bilateral    "laser OR for diabetic retinopathy"  . INGUINAL HERNIA REPAIR     [name] 07/12/2010), "don't remember which side"  . LAPAROTOMY  08/23/2012   Procedure: EXPLORATORY LAPAROTOMY;  Surgeon: [name], DO;  Location: [facility];  Service: General;  Laterality: N/A;  exploratory laparotomy with lysis of adhesions  . LYSIS OF ADHESION  08/23/2012   Procedure: LYSIS OF ADHESION;  Surgeon: [name], DO;  Location: [facility];  Service: General;;  . PERIPHERAL VASCULAR BALLOON ANGIOPLASTY  05/30/2019   Procedure: PERIPHERAL VASCULAR BALLOON ANGIOPLASTY;  Surgeon: Marty Heck, MD;  Location: [facility];  Service: Cardiovascular;;  left anterior tibial  . ROBOT ASSISTED LAPAROSCOPIC RADICAL PROSTATECTOMY  2000's   "had to finish manually after machine broke"  . SHOULDER SURGERY  1970's  separation; from playing football"    Inpatient Medications: Scheduled Meds: . atorvastatin  20 mg Oral q morning - 10a  . enoxaparin (LOVENOX) injection  30 mg Subcutaneous Q24H  . furosemide  40 mg Intravenous Q12H  . insulin aspart  0-5 Units Subcutaneous QHS  . insulin aspart  0-9 Units Subcutaneous TID WC  . insulin glargine  20 Units Subcutaneous Daily  . mupirocin cream   Topical Daily  . sodium chloride flush   3 mL Intravenous Q12H   Continuous Infusions: . sodium chloride     PRN Meds: sodium chloride, acetaminophen, ondansetron **OR** ondansetron (ZOFRAN) IV, sodium chloride flush  Allergies:    Allergies  Allergen Reactions  . Ace Inhibitors Itching and Cough  . Naproxen Anaphylaxis, Shortness Of Breath and Other (See Comments)    Throat swells. cannot breathe, and causes GI distress    Social History:   Social History   Socioeconomic History  . Marital status: Married    Spouse name: Not on file  . Number of children: Not on file  . Years of education: Not on file  . Highest education level: Not on file  Occupational History  . Not on file  Social Needs  . Financial resource strain: Not on file  . Food insecurity    Worry: Not on file    Inability: Not on file  . Transportation needs    Medical: Not on file    Non-medical: Not on file  Tobacco Use  . Smoking status: Former Smoker    Packs/day: 2.50    Years: 3.00    Pack years: 7.50    Types: Cigarettes    Quit date: 11/09/1966    Years since quitting: 52.6  . Smokeless tobacco: Never Used  Substance and Sexual Activity  . Alcohol use: Yes    Comment: 04/08/2015 "maybe a beer/ or 2 or a glass of wine monthly"  . Drug use: No  . Sexual activity: Yes  Lifestyle  . Physical activity    Days per week: Not on file    Minutes per session: Not on file  . Stress: Not on file  Relationships  . Social Herbalist on phone: Not on file    Gets together: Not on file    Attends religious service: Not on file    Active member of club or organization: Not on file    Attends meetings of clubs or organizations: Not on file    Relationship status: Not on file  . Intimate partner violence    Fear of current or ex partner: Not on file    Emotionally abused: Not on file    Physically abused: Not on file    Forced sexual activity: Not on file  Other Topics Concern  . Not on file  Social History Narrative  . Not  on file    Family History:   Family History  Problem Relation Age of Onset  . Diabetes Mellitus II Mother   . Colon cancer Neg Hx      ROS:  Please see the history of present illness.  All other ROS reviewed and negative.     Physical Exam/Data:   Vitals:   06/26/19 0320 06/26/19 0452 06/26/19 0727 06/26/19 1248  BP:  (!) 142/59 140/61 (!) 149/60  Pulse:  62 62 (!) 59  Resp:  16 20 20   Temp:  98.6 F (37 C) 98.9 F (37.2 C) 98.1 F (36.7 C)  TempSrc:  Oral Oral Oral  SpO2:  100% 100% 98%  Weight: 78.6 kg     Height: 5\' 6"  (1.676 m)      No intake or output data in the 24 hours ending 06/26/19 1256 Last 3 Weights 06/26/2019 06/24/2019 06/21/2019  Weight (lbs) 173 lb 3.2 oz 180 lb 181 lb  Weight (kg) 78.563 kg 81.647 kg 82.101 kg     Body mass index is 27.96 kg/m.  General:  Well nourished, well developed, in no acute distress HEENT: normal Lymph: no adenopathy Neck: + JVD Endocrine:  No thryomegaly Vascular: No carotid bruits; FA pulses 2+ bilaterally without bruits  Cardiac:  normal S1, S2; RRR; no murmur  Lungs: Diminished breath sounds Abd: soft, nontender, no hepatomegaly  Ext: Trace lower extremity edema Musculoskeletal:  No deformities, BUE and BLE strength normal and equal Skin: warm and dry  Neuro:  CNs 2-12 intact, no focal abnormalities noted Psych:  Normal affect   EKG:  The EKG was personally reviewed and demonstrates: Sinus rhythm at controlled rate with PR interval 262 MS Telemetry:  Telemetry was personally reviewed and demonstrates:  Sinus rhythm at rate of 70s  Relevant CV Studies:  Echo 05/06/2019  1. Left ventricular ejection fraction, by visual estimation, is 60 to 65%. The left ventricle has normal function. Normal left ventricular size. Mildly increased left ventricular posterior wall thickness. There is no left ventricular hypertrophy.  2. Left ventricular diastolic Doppler parameters are consistent with impaired relaxation pattern of LV  diastolic filling.  3. Global right ventricle has normal systolic function.The right ventricular size is normal. No increase in right ventricular wall thickness.  4. Left atrial size was normal.  5. Right atrial size was normal.  6. The mitral valve is normal in structure. Mild mitral valve regurgitation. No evidence of mitral stenosis.  7. The tricuspid valve is normal in structure. Tricuspid valve regurgitation was not visualized by color flow Doppler.  8. The aortic valve is normal in structure. Aortic valve regurgitation is mild by color flow Doppler. Structurally normal aortic valve, with no evidence of sclerosis or stenosis.  9. The pulmonic valve was normal in structure. Pulmonic valve regurgitation is not visualized by color flow Doppler. 10. The inferior vena cava is normal in size with greater than 50% respiratory variability, suggesting right atrial pressure of 3 mmHg.  Laboratory Data:  High Sensitivity Troponin:  No results for input(s): TROPONINIHS in the last 720 hours.   Chemistry Recent Labs  Lab 06/24/19 1200 06/25/19 1038  NA 139 134*  K 5.9* 5.4*  CL 99 100  CO2 23 24  GLUCOSE 252* 223*  BUN 23 19  CREATININE 2.06* 1.89*  CALCIUM 9.3 8.7*  GFRNONAA 32* 35*  GFRAA 37* 41*  ANIONGAP  --  10    No results for input(s): PROT, ALBUMIN, AST, ALT, ALKPHOS, BILITOT in the last 168 hours. Hematology Recent Labs  Lab 06/25/19 1038  WBC 4.7  RBC 4.51  HGB 9.5*  HCT 30.5*  MCV 67.6*  MCH 21.1*  MCHC 31.1  RDW 16.0*  PLT 229   BNP Recent Labs  Lab 06/24/19 1200 06/25/19 1038  BNP 273.1* 294.1*    Radiology/Studies:  Dg Chest 2 View  Result Date: 06/25/2019 CLINICAL DATA:  CHF EXAM: CHEST - 2 VIEW COMPARISON:  May 09, 2019 FINDINGS: Small bilateral pleural effusions, right greater than left. Associated basilar atelectasis. Normal heart size. No acute osseous abnormality. IMPRESSION: Small bilateral pleural effusions and associated basilar  atelectasis. Electronically  Signed   By: Macy Mis M.D.   On: 06/25/2019 10:55    Assessment and Plan:   1. Acute on chronic diastolic CHF - BNP 270.  Only 360 cc output reported., Doubt acute.  Weight 172 LB today (it was 180 in clinic 2 days ago).  Reports improvement on breathing.  Continue IV diuresis, likely change to p.o. -Recommended DASH diet with sodium restriction to 2 g/day. Poor in-site of health condition.   2. Acute on CKD III - Baseline Scr around 1.6. SCr 2.06>>1.89. Pending BMET  -Follow closely with diuresis  3. Hyperkalemia - K of 5.9>>>5.4>> pending today -Continue to hold losartan -Likely due to AKI  4. Transient CHB 04/2019 - She did not got 30 days monitor as outpatient, reported due to family issue.  - Order placed for ZIO.  -No bradycardia arrhythmia on monitor while admitted  5. HTN -Minimally elevated -Home losartan on hold due to AKI and hyperkalemia -Avoid rate control agent given transient complete heart block previously -Consider hydralazine or amlodipine  6.  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  Peripheral vascular disease - s/p left anterior tibial angioplasty 05/30/2019 (followed by vascular) -Continue aspirin and Plavix  8.  Uncontrolled diabetes -Hemoglobin A1c was 10.74-month ago -On sliding scale insulin -Commended reviewing home medication strict control  9. HLD - 05/06/2019: Cholesterol 136; HDL 53; LDL Cholesterol 74; Triglycerides 47; VLDL 9  -Continue Lipitor 20 mg  Dr. Meda Coffee to see later today.  For questions or updates, please contact [place] Please consult [email or web address] for contact info under   SignedLeanor Kail, PA  06/26/2019 12:56 PM   The patient was seen, examined and discussed with Leanor Kail, PA  and I agree with the above.   68 y.o. male with a hx of transient CHB, HTN, HLD, CKD III, PAD s/p left anterior tibial  angioplasty 05/30/2019 (followed by vascular), DM and prostate cancer who is being seen today for the evaluation of CHF.  The patient is a patient of Dr. Davina Poke, last seen on June 24, 2019 after she underwent 30-day event monitor that showed possible CHB vs Wenkebach. He has a very long PR interval at baseline (~350 msec) and given the recovery this could just be Wenkebach.  Dr. Davina Poke ordered 7-day Zio patch monitor to further evaluate.  She appeared to be fluid overloaded at the time and was felt that her noncompliance. Plan was to start torsemide daily and losartan however lab work came abnormal with potassium of 5.9 and creatinine 2.06.  He was instructed to hold losartan and advised to come to ER for IV diuresis yesterday.  BNP 294. K was 5.4 yesterday with SCr of 1.89. Started on IV lasix 40mg  BID (got 2 dose so far).   Her echocardiogram shows LVEF 60 to 65% mild LVH, grade 1 diastolic dysfunction normal RV function normal left atrial size, mild MR, otherwise normal echocardiogram.  Patient weight is down 7 pounds with improved creatinine on diuretics, creatinine down from 2.0-1.8, he says he feels significantly better and his lower extremity edema is improved.  Looking at his echocardiogram and not significantly elevated BNP I believe this is mostly secondary to poor dietary compliance, I will schedule a dietitian consultation.  His Zio patch has not been applied yet, we will continue following telemetry here so far he has no signs of AV block.  I would continue Lasix 40 mg IV twice daily for now,  we will follow.  Ena Dawley, MD 06/26/2019

## 2019-06-26 NOTE — Progress Notes (Addendum)
PROGRESS NOTE    [name]  EOF:[id number] DOB: August 19, 1950 DOA: 06/25/2019 PCP: [name], MD  Brief Narrative: 68 year old male with history of type 2 diabetes mellitus, chronic diastolic CHF, chronic kidney disease stage III, PAD, prostate cancer was sent to the emergency room yesterday due to abnormal labs namely worsening kidney function and potassium of 5.9 with volume overloaded state. -Patient admits poor compliance with diuretics and salt restriction  Assessment & Plan: 1.  Acute on chronic diastolic CHF -he admits poor compliance with diuretics and diet, he was supposed to be on Lasix 40 mg twice daily however was not taking this on a regular basis, was not following a low-salt diet -Was recently changed prior to this admission to torsemide and losartan -Last echo 04/2019 with EF of 60 to 65%, impaired relaxation -Continue IV Lasix 40 mg every 12 monitor I/os, daily weights  -Remains volume overloaded -Follow bmet closely  2. Acute on CKD III - Baseline Scr around 1.6 -Likely cardiorenal, worsened by recent ARB use  -monitor with diuresis, ARB on hold  3. Hyperkalemia -In the setting of renal insufficiency and ARB use, losartan discontinued -Repeat labs pending this morning -Given Lokelma  4.  History of transient complete heart block in 04/2019  -Monitor on telemetry  5. HTN -Stable, monitor with diuresis  6.  Peripheral vascular disease - s/p left anterior tibial angioplasty 05/30/2019 (followed by vascular) -Continue aspirin and Plavix -History of left third toe amputation  7.  Uncontrolled diabetes -Last hemoglobin A1c was 10.3 -Restart Lantus, sliding scale insulin, educated regarding diet, lifestyle modification -RD consult  DVT prophylaxis: Lovenox Code Status: Full code Family Communication: No family at bedside Disposition Plan: home when improved, in my medical opinion patient does not stable for discharge home today, he is clinically  volume overloaded with AKI on CKD 3 and hyperkalemia    Procedures:   Antimicrobials:    Subjective: -Breathing improving, not back to baseline, admits poor compliance with diuretics and diet for a long  Objective: Vitals:   06/26/19 0320 06/26/19 0452 06/26/19 0727 06/26/19 1248  BP:  (!) 142/59 140/61 (!) 149/60  Pulse:  62 62 (!) 59  Resp:  16 20 20   Temp:  98.6 F (37 C) 98.9 F (37.2 C) 98.1 F (36.7 C)  TempSrc:  Oral Oral Oral  SpO2:  100% 100% 98%  Weight: 78.6 kg     Height: 5\' 6"  (1.676 m)       Intake/Output Summary (Last 24 hours) at 06/26/2019 1403 Last data filed at 06/26/2019 1300 Gross per 24 hour  Intake 240 ml  Output 600 ml  Net -360 ml   Filed Weights   06/26/19 0320  Weight: 78.6 kg    Examination:  General exam: Ill male sitting up in bed AAOx3 Respiratory system: Fine bibasilar crackles Cardiovascular system: S1 & S2 heard, RRR.  Positive JVD gastrointestinal system: Abdomen is nondistended, soft and nontender.Normal bowel sounds heard. Central nervous system: Alert and oriented. No focal neurological deficits. Extremities: 2+ edema, blister on the lateral aspect of his left foot with discoloration of his skin on the lateral and plantar surface Skin: As above Psychiatry: Judgement and insight appear normal. Mood & affect appropriate.     Data Reviewed:   CBC: Recent Labs  Lab 06/25/19 1038  WBC 4.7  HGB 9.5*  HCT 30.5*  MCV 67.6*  PLT 254   Basic Metabolic Panel: Recent Labs  Lab 06/24/19 1200 06/25/19 1038  NA 139  134*  K 5.9* 5.4*  CL 99 100  CO2 23 24  GLUCOSE 252* 223*  BUN 23 19  CREATININE 2.06* 1.89*  CALCIUM 9.3 8.7*  MG  --  2.1   GFR: Estimated Creatinine Clearance: 35.9 mL/min (A) (by C-G formula based on SCr of 1.89 mg/dL (H)). Liver Function Tests: No results for input(s): AST, ALT, ALKPHOS, BILITOT, PROT, ALBUMIN in the last 168 hours. No results for input(s): LIPASE, AMYLASE in the last 168  hours. No results for input(s): AMMONIA in the last 168 hours. Coagulation Profile: No results for input(s): INR, PROTIME in the last 168 hours. Cardiac Enzymes: No results for input(s): CKTOTAL, CKMB, CKMBINDEX, TROPONINI in the last 168 hours. BNP (last 3 results) No results for input(s): PROBNP in the last 8760 hours. HbA1C: No results for input(s): HGBA1C in the last 72 hours. CBG: Recent Labs  Lab 06/25/19 2303 06/26/19 0256 06/26/19 0745 06/26/19 1247  GLUCAP 297* 336* 169* 180*   Lipid Profile: No results for input(s): CHOL, HDL, LDLCALC, TRIG, CHOLHDL, LDLDIRECT in the last 72 hours. Thyroid Function Tests: No results for input(s): TSH, T4TOTAL, FREET4, T3FREE, THYROIDAB in the last 72 hours. Anemia Panel: No results for input(s): VITAMINB12, FOLATE, FERRITIN, TIBC, IRON, RETICCTPCT in the last 72 hours. Urine analysis:    Component Value Date/Time   COLORURINE YELLOW 09/03/2015 1505   APPEARANCEUR CLEAR 09/03/2015 1505   LABSPEC 1.035 (H) 09/03/2015 1505   PHURINE 6.0 09/03/2015 1505   GLUCOSEU >1000 (A) 09/03/2015 1505   HGBUR NEGATIVE 09/03/2015 1505   HGBUR negative 08/19/2008 1530   BILIRUBINUR NEGATIVE 09/03/2015 1505   KETONESUR NEGATIVE 09/03/2015 1505   PROTEINUR NEGATIVE 09/03/2015 1505   UROBILINOGEN 0.2 04/08/2015 0456   NITRITE NEGATIVE 09/03/2015 1505   LEUKOCYTESUR NEGATIVE 09/03/2015 1505   Sepsis Labs: @LABRCNTIP (procalcitonin:4,lacticidven:4)  ) Recent Results (from the past 240 hour(s))  SARS CORONAVIRUS 2 (TAT 6-24 HRS) Nasopharyngeal Nasopharyngeal Swab     Status: None   Collection Time: 06/25/19  7:45 PM   Specimen: Nasopharyngeal Swab  Result Value Ref Range Status   SARS Coronavirus 2 NEGATIVE NEGATIVE Final    Comment: (NOTE) SARS-CoV-2 target nucleic acids are NOT DETECTED. The SARS-CoV-2 RNA is generally detectable in upper and lower respiratory specimens during the acute phase of infection. Negative results do not preclude  SARS-CoV-2 infection, do not rule out co-infections with other pathogens, and should not be used as the sole basis for treatment or other patient management decisions. Negative results must be combined with clinical observations, patient history, and epidemiological information. The expected result is Negative. Fact Sheet for Patients: SugarRoll.be Fact Sheet for Healthcare Providers: https://www.woods-mathews.com/ This test is not yet approved or cleared by the [place] FDA and  has been authorized for detection and/or diagnosis of SARS-CoV-2 by FDA under an Emergency Use Authorization (EUA). This EUA will remain  in effect (meaning this test can be used) for the duration of the COVID-19 declaration under Section 56 4(b)(1) of the Act, 21 U.S.C. section 360bbb-3(b)(1), unless the authorization is terminated or revoked sooner. Performed at [facility], [place] [address] [place], [place] [address]          Radiology Studies: Dg Chest 2 View  Result Date: 06/25/2019 CLINICAL DATA:  CHF EXAM: CHEST - 2 VIEW COMPARISON:  May 09, 2019 FINDINGS: Small bilateral pleural effusions, right greater than left. Associated basilar atelectasis. Normal heart size. No acute osseous abnormality. IMPRESSION: Small bilateral pleural effusions and associated basilar atelectasis.  Electronically Signed   By: Macy Mis M.D.   On: 06/25/2019 10:55        Scheduled Meds: . aspirin EC  81 mg Oral Daily  . atorvastatin  20 mg Oral q morning - 10a  . clopidogrel  75 mg Oral Daily  . enoxaparin (LOVENOX) injection  30 mg Subcutaneous Q24H  . furosemide  40 mg Intravenous Q12H  . insulin aspart  0-5 Units Subcutaneous QHS  . insulin aspart  0-9 Units Subcutaneous TID WC  . insulin glargine  20 Units Subcutaneous Daily  . mupirocin cream   Topical Daily  . sodium chloride flush  3 mL Intravenous Q12H   Continuous Infusions: . sodium  chloride       LOS: 0 days    Time spent: 56min    Domenic Polite, MD [facility] Page via [email or web address], password [email or web address] After 7PM please contact night-coverage  06/26/2019, 2:03 PM

## 2019-06-26 NOTE — Progress Notes (Addendum)
Initial Nutrition Assessment  DOCUMENTATION CODES:   Non-severe (moderate) malnutrition in context of chronic illness  INTERVENTION:   -Prostat po BID -MVI with minerals -Provided education on low sodium and carb controlled diet. Refer to education note for further details.  NUTRITION DIAGNOSIS:   Moderate Malnutrition related to chronic illness(Stage III CKD, CHF and uncontrolled Type 2 DM) as evidenced by moderate muscle depletion, moderate fat depletion   GOAL:   Patient will meet greater than or equal to 90% of their needs  MONITOR:   PO intake, Supplement acceptance, Diet advancement, Labs, Weight trends  REASON FOR ASSESSMENT:   Consult Diet education  ASSESSMENT:   68 year old pt admitted with fluid overload and has PMH of stage III chronic renal failure, type 2 DM, hypertension, hyperlipidemia and hyperkalemia. Pt found to have fluid overload, hyperkalemia and left full thickness foot ulcer.  Pt was alert and sitting up in bed. Pt stated he typically eats a breakfast of bran cereal with bananas, a lunch of salmon and brown rice and dinner out of the house. He eats snacks of movie butter popcorn with added melted butter. Pt stated he has a good appetite and stays away from white potatoes, bread and rice. Pt stated a desire to not advance to dialysis and a willingness to make changes to his diet. Education was provided on a low sodium and carb controlled diet. We discussed substitutions that could be made to lower sodium intake and eating consistent carbs throughout the day. We spoke about the importance of eating fresh fruits and vegetables, reducing sodium in cooked meals, not adding salt to foods, choosing low sodium or no sodium items at the store. We also discussed strategies to having low sodium meals at restaurants and key words to look for on the menu to help point to lower sodium options. Please see education note for further information. Also discussed the importance  of getting his blood glucose down and getting enough protein and nutrition to heal the wound on his foot.  Pt noted he had lost muscle and has contributed it to being unable to work or exercise due to the ulcer on his foot. Wt readings from his chart show the pt is wt stable but is also fluid overloaded.   Pt asked questions about the importance of at home medication management and specifically about insulin. Pt stated that being on short-term disability does not provide enough for him to cover the expenses of his medications.   Diet advanced to heart healthy/CHO modified,   Medications Reviewed: aspirin 81mg , plavix 75mg , lipitor 20 mg, lovenox 30mg , lasix, 40mg , novolog, lantus 20 units  Labs Reviewed: glucose 169, A1C 10.3, Potassium 5.4, Sodium 134  NUTRITION - FOCUSED PHYSICAL EXAM:    Most Recent Value  Orbital Region  Mild depletion  Upper Arm Region  Moderate depletion  Thoracic and Lumbar Region  Moderate depletion  Buccal Region  Mild depletion  Temple Region  Moderate depletion  Clavicle Bone Region  Moderate depletion  Clavicle and Acromion Bone Region  Moderate depletion  Scapular Bone Region  Moderate depletion  Dorsal Hand  Moderate depletion  Patellar Region  Mild depletion  Anterior Thigh Region  Mild depletion  Posterior Calf Region  Mild depletion  Edema (RD Assessment)  Mild  Hair  Reviewed  Eyes  Reviewed  Mouth  Reviewed  Skin  Reviewed  Nails  Reviewed       Diet Order:   Diet Order  Diet heart healthy/carb modified Room service appropriate? Yes; Fluid consistency: Thin  Diet effective now              EDUCATION NEEDS:   Education needs have been addressed  Skin:  Skin Assessment: Reviewed RN Assessment  Last BM:  11/10  Height:   Ht Readings from Last 1 Encounters:  06/26/19 5\' 6"  (1.676 m)    Weight:   Wt Readings from Last 1 Encounters:  06/26/19 78.6 kg    Ideal Body Weight:  64.5 kg  BMI:  Body mass index is  27.96 kg/m.  Estimated Nutritional Needs:   Kcal:  1900-2100  Protein:  95-110  Fluid:  2L   [name] Norris Dietetic Intern Pager # [phone]

## 2019-06-26 NOTE — Plan of Care (Signed)
Nutrition Education Note  RD consulted for nutrition education regarding CHF.  RD provided "Low Sodium Nutrition Therapy" handout from the Academy of Nutrition and Dietetics. Reviewed patient's dietary recall. Provided examples on ways to decrease sodium intake in diet. Discouraged intake of processed foods and use of salt shaker. Encouraged fresh fruits and vegetables as well as whole grain sources of carbohydrates to maximize fiber intake.   RD discussed why it is important for patient to adhere to diet recommendations, and emphasized the role of fluids, foods to avoid, and importance of weighing self daily. Teach back method used.  Expect fair to good compliance.  RD following pt for acute nutrition issues during hospitalization; refer to initial assessment for further details.  Shedric Fredericks A. Jimmye Norman, RD, LDN, [place] Registered Dietitian II Certified Diabetes Care and Education Specialist Pager: [phone] After hours Pager: [phone]

## 2019-06-26 NOTE — Addendum Note (Signed)
Addended by: York Cerise C on: 06/26/2019 01:44 PM   Modules accepted: Orders

## 2019-06-27 ENCOUNTER — Ambulatory Visit: Payer: Medicare Other | Admitting: Family Medicine

## 2019-06-27 DIAGNOSIS — E875 Hyperkalemia: Secondary | ICD-10-CM

## 2019-06-27 LAB — GLUCOSE, CAPILLARY
Glucose-Capillary: 119 mg/dL — ABNORMAL HIGH (ref 70–99)
Glucose-Capillary: 143 mg/dL — ABNORMAL HIGH (ref 70–99)
Glucose-Capillary: 134 mg/dL — ABNORMAL HIGH (ref 70–99)
Glucose-Capillary: 119 mg/dL — ABNORMAL HIGH (ref 70–99)
Glucose-Capillary: 179 mg/dL — ABNORMAL HIGH (ref 70–99)

## 2019-06-27 LAB — BASIC METABOLIC PANEL
Anion gap: 10 (ref 5–15)
BUN: 17 mg/dL (ref 8–23)
CO2: 26 mmol/L (ref 22–32)
Calcium: 8.5 mg/dL — ABNORMAL LOW (ref 8.9–10.3)
Chloride: 104 mmol/L (ref 98–111)
Creatinine, Ser: 1.92 mg/dL — ABNORMAL HIGH (ref 0.61–1.24)
GFR calc Af Amer: 40 mL/min — ABNORMAL LOW (ref >60)
GFR calc non Af Amer: 35 mL/min — ABNORMAL LOW (ref >60)
Glucose, Bld: 131 mg/dL — ABNORMAL HIGH (ref 70–99)
Potassium: 3.6 mmol/L (ref 3.5–5.1)
Sodium: 140 mmol/L (ref 135–145)

## 2019-06-27 LAB — CBC
HCT: 27.5 % — ABNORMAL LOW (ref 39.0–52.0)
Hemoglobin: 8.5 g/dL — ABNORMAL LOW (ref 13.0–17.0)
MCH: 20.4 pg — ABNORMAL LOW (ref 26.0–34.0)
MCHC: 30.9 g/dL (ref 30.0–36.0)
MCV: 65.9 fL — ABNORMAL LOW (ref 80.0–100.0)
Platelets: 233 10*3/uL (ref 150–400)
RBC: 4.17 MIL/uL — ABNORMAL LOW (ref 4.22–5.81)
RDW: 15.8 % — ABNORMAL HIGH (ref 11.5–15.5)
WBC: 5.4 10*3/uL (ref 4.0–10.5)
nRBC: 0 % (ref 0.0–0.2)

## 2019-06-27 MED ORDER — DOCUSATE SODIUM 100 MG PO CAPS
100.0000 mg | ORAL_CAPSULE | Freq: Two times a day (BID) | ORAL | Status: DC
  Administered 2019-06-27 – 2019-06-29 (×5): 100 mg via ORAL
  Filled 2019-06-27 (×5): qty 1

## 2019-06-27 MED ORDER — INSULIN ASPART PROT & ASPART (70-30 MIX) 100 UNIT/ML ~~LOC~~ SUSP: 15.0000 [IU] | Freq: Two times a day (BID) | SUBCUTANEOUS | Status: DC

## 2019-06-27 MED ORDER — SENNA 8.6 MG PO TABS
1.0000 | ORAL_TABLET | Freq: Every day | ORAL | Status: DC
  Administered 2019-06-27 – 2019-06-29 (×3): 8.6 mg via ORAL
  Filled 2019-06-27 (×3): qty 1

## 2019-06-27 MED ORDER — INSULIN ASPART PROT & ASPART (70-30 MIX) 100 UNIT/ML ~~LOC~~ SUSP
15.0000 [IU] | Freq: Two times a day (BID) | SUBCUTANEOUS | Status: DC
  Administered 2019-06-28 – 2019-06-29 (×2): 15 [IU] via SUBCUTANEOUS
  Filled 2019-06-27 (×2): qty 10

## 2019-06-27 MED ORDER — AMLODIPINE BESYLATE 2.5 MG PO TABS
2.5000 mg | ORAL_TABLET | Freq: Every day | ORAL | Status: DC
  Administered 2019-06-27 – 2019-06-29 (×3): 2.5 mg via ORAL
  Filled 2019-06-27 (×3): qty 1

## 2019-06-27 NOTE — Plan of Care (Signed)

## 2019-06-27 NOTE — Progress Notes (Addendum)
PROGRESS NOTE    [name]  WNI:[id number] DOB: 05-03-1951 DOA: 06/25/2019 PCP: [name], MD    Brief Narrative:  68 year old male with history of type 2 diabetes mellitus, chronic diastolic CHF, chronic kidney disease stage III, PAD, prostate cancer was sent to the emergency room yesterday due to abnormal labs namely worsening kidney function and potassium of 5.9 with volume overloaded state.  He was seen at his cardiologist and had reported increased swelling, lower extremity edema, and shortness of breath -Patient admits poor compliance with diuretics and salt restriction    Consultants:   Orthopedic, cardiology  Procedures: None  Antimicrobials:   None   Subjective: Patient reports his Hartness of breath is improving feels that his lower extremity edema is better.  No chest pain  Objective: Vitals:   06/27/19 0100 06/27/19 0605 06/27/19 0608 06/27/19 0738  BP: (!) 160/70  (!) 169/74 (!) 142/58  Pulse:   66 64  Resp:   17 18  Temp:   98.3 F (36.8 C) 97.7 F (36.5 C)  TempSrc:   Oral Oral  SpO2:   98% 95%  Weight:  75.8 kg    Height:        Intake/Output Summary (Last 24 hours) at 06/27/2019 0902 Last data filed at 06/27/2019 0742 Gross per 24 hour  Intake 360 ml  Output 1700 ml  Net -1340 ml   Filed Weights   06/26/19 0320 06/27/19 0605  Weight: 78.6 kg 75.8 kg    Examination:  General exam: Appears calm and comfortable  HEENT: EOMI, +jvp Respiratory system: Clear to auscultation. Respiratory effort normal. Cardiovascular system: S1 & S2 heard, RRR. no murmurs, rubs, gallops Gastrointestinal system: Abdomen is nondistended, soft and nontender.  Normal bowel sounds heard. Central nervous system: Alert and oriented. No focal neurological deficits. Extremities: Decreased lower extremity edema Skin: Warm and dry Psychiatry:  Mood & affect appropriate.     Data Reviewed: I have personally reviewed following labs and imaging studies   CBC: Recent Labs  Lab 06/25/19 1038 06/26/19 1422 06/27/19 0438  WBC 4.7 2.9* 5.4  HGB 9.5* 8.8* 8.5*  HCT 30.5* 28.7* 27.5*  MCV 67.6* 66.4* 65.9*  PLT 229 224 381   Basic Metabolic Panel: Recent Labs  Lab 06/24/19 1200 06/25/19 1038 06/26/19 1422 06/27/19 0438  NA 139 134* 138 140  K 5.9* 5.4* 3.8 3.6  CL 99 100 103 104  CO2 23 24 27 26   GLUCOSE 252* 223* 208* 131*  BUN 23 19 17 17   CREATININE 2.06* 1.89* 1.89* 1.92*  CALCIUM 9.3 8.7* 8.6* 8.5*  MG  --  2.1  --   --    GFR: Estimated Creatinine Clearance: 32.3 mL/min (A) (by C-G formula based on SCr of 1.92 mg/dL (H)). Liver Function Tests: No results for input(s): AST, ALT, ALKPHOS, BILITOT, PROT, ALBUMIN in the last 168 hours. No results for input(s): LIPASE, AMYLASE in the last 168 hours. No results for input(s): AMMONIA in the last 168 hours. Coagulation Profile: No results for input(s): INR, PROTIME in the last 168 hours. Cardiac Enzymes: No results for input(s): CKTOTAL, CKMB, CKMBINDEX, TROPONINI in the last 168 hours. BNP (last 3 results) No results for input(s): PROBNP in the last 8760 hours. HbA1C: No results for input(s): HGBA1C in the last 72 hours. CBG: Recent Labs  Lab 06/26/19 0745 06/26/19 1247 06/26/19 1720 06/26/19 2117 06/27/19 0549  GLUCAP 169* 180* 224* 167* 119*   Lipid Profile: No results for input(s): CHOL, HDL,  LDLCALC, TRIG, CHOLHDL, LDLDIRECT in the last 72 hours. Thyroid Function Tests: No results for input(s): TSH, T4TOTAL, FREET4, T3FREE, THYROIDAB in the last 72 hours. Anemia Panel: No results for input(s): VITAMINB12, FOLATE, FERRITIN, TIBC, IRON, RETICCTPCT in the last 72 hours. Sepsis Labs: No results for input(s): PROCALCITON, LATICACIDVEN in the last 168 hours.  Recent Results (from the past 240 hour(s))  SARS CORONAVIRUS 2 (TAT 6-24 HRS) Nasopharyngeal Nasopharyngeal Swab     Status: None   Collection Time: 06/25/19  7:45 PM   Specimen: Nasopharyngeal Swab   Result Value Ref Range Status   SARS Coronavirus 2 NEGATIVE NEGATIVE Final    Comment: (NOTE) SARS-CoV-2 target nucleic acids are NOT DETECTED. The SARS-CoV-2 RNA is generally detectable in upper and lower respiratory specimens during the acute phase of infection. Negative results do not preclude SARS-CoV-2 infection, do not rule out co-infections with other pathogens, and should not be used as the sole basis for treatment or other patient management decisions. Negative results must be combined with clinical observations, patient history, and epidemiological information. The expected result is Negative. Fact Sheet for Patients: SugarRoll.be Fact Sheet for Healthcare Providers: https://www.woods-mathews.com/ This test is not yet approved or cleared by the [place] FDA and  has been authorized for detection and/or diagnosis of SARS-CoV-2 by FDA under an Emergency Use Authorization (EUA). This EUA will remain  in effect (meaning this test can be used) for the duration of the COVID-19 declaration under Section 56 4(b)(1) of the Act, 21 U.S.C. section 360bbb-3(b)(1), unless the authorization is terminated or revoked sooner. Performed at [facility], [place] [address] [place], [place] [address]          Radiology Studies: Dg Chest 2 View  Result Date: 06/25/2019 CLINICAL DATA:  CHF EXAM: CHEST - 2 VIEW COMPARISON:  May 09, 2019 FINDINGS: Small bilateral pleural effusions, right greater than left. Associated basilar atelectasis. Normal heart size. No acute osseous abnormality. IMPRESSION: Small bilateral pleural effusions and associated basilar atelectasis. Electronically Signed   By: Macy Mis M.D.   On: 06/25/2019 10:55        Scheduled Meds: . aspirin EC  81 mg Oral Daily  . atorvastatin  20 mg Oral q morning - 10a  . clopidogrel  75 mg Oral Daily  . enoxaparin (LOVENOX) injection  30 mg Subcutaneous Q24H  .  feeding supplement (PRO-STAT SUGAR FREE 64)  30 mL Oral BID  . furosemide  40 mg Intravenous Q12H  . insulin aspart  0-5 Units Subcutaneous QHS  . insulin aspart  0-9 Units Subcutaneous TID WC  . insulin glargine  20 Units Subcutaneous Daily  . multivitamin with minerals  1 tablet Oral Daily  . mupirocin cream   Topical Daily  . sodium chloride flush  3 mL Intravenous Q12H   Continuous Infusions: . sodium chloride      Assessment & Plan:   Active Problems:   Fluid overload   Acute on chronic diastolic CHF (congestive heart failure), NYHA class 3 (HCC)   Heart block AV complete (HCC)   Acute renal failure with acute tubular necrosis superimposed on stage 4 chronic kidney disease ([place])   Assessment & Plan: 1.Acute on chronic diastolic CHF -noncompliant with diuretics and diet . -Clinically improving. --Was recently changed prior to this admission to torsemide and losartan -Last echo 04/2019 with EF of 60 to 97%,QBHAL 1 diastolic dysfunction  -Continue IV Lasix 40 mg every 12 monitor - I/os, daily weights  -Cards following -Monitor  labs   2. Acute on CKD III - Baseline Scr around 1.6 -Likely cardiorenal, worsened by recent ARB use -Now worsening, continues to rise we will consult nephrology  -monitor with diuresis, ARB on hold  3. Hyperkalemia -In the setting of renal insufficiency and ARB use -losartan discontinued -Levels now stable  4.  History of transient complete heart block in 04/2019  -Monitor on telemetry  5. HTN -Stable, monitor with diuresis  6.Peripheral vascular disease - s/p left anterior tibial angioplasty 05/30/2019 (followed by vascular) -Continue aspirin and Plavix -History of left third toe amputation  7.Uncontrolled diabetes -Last hemoglobin A1c was 10.3  on Lantus, sliding scale insulin -RD consulted FS controlled here. For discharge: need to d/c on Novolin Relion 70/30 pen [phone] which is more affordable to the pt  DVT  prophylaxis: Lovenox Code Status: Full code Family Communication: No family at bedside Disposition Plan: will be here 2 [place] stays as pt currently not medically stable.              LOS: 1 day   Time spent: 45 minutes with more than 50% on Baker, MD [facility] Pager [phone]  If 7PM-7AM, please contact night-coverage [email or web address] Password [email or web address] 06/27/2019, 9:02 AM

## 2019-06-27 NOTE — Progress Notes (Signed)
Brief Nutrition Follow-Up Note  RD received another consult for diet education. RD and dietetic intern spent extensive time with pt yesterday educating pt on low low sodium, carb modified diet as well as assessing nutritional needs to help improve nutritional status in light of lt ankle ulcer. Please refer to education and assessment note on 06/26/19 for further details. If further education needs are warranted, consider outpatient referral to [facility] for further reinforcement after hospital discharge.   RD will continue to follow for acute nutritional needs identified during hospitalization.   Arshia Rondon A. Jimmye Norman, RD, LDN, [place] Registered Dietitian II Certified Diabetes Care and Education Specialist Pager: [phone] After hours Pager: [phone]

## 2019-06-27 NOTE — Progress Notes (Signed)
Patient Name: [name] Date of Encounter: 06/27/2019  Active Problems:   Fluid overload   Acute on chronic diastolic CHF (congestive heart failure), NYHA class 3 (HCC)   Heart block AV complete ([place])   Acute renal failure with acute tubular necrosis superimposed on stage 4 chronic kidney disease ([place])   Length of Stay: 1  SUBJECTIVE  The patient is feeling better, improved LE edema still SOB at night.  CURRENT MEDS . aspirin EC  81 mg Oral Daily  . atorvastatin  20 mg Oral q morning - 10a  . clopidogrel  75 mg Oral Daily  . enoxaparin (LOVENOX) injection  30 mg Subcutaneous Q24H  . feeding supplement (PRO-STAT SUGAR FREE 64)  30 mL Oral BID  . furosemide  40 mg Intravenous Q12H  . insulin aspart  0-5 Units Subcutaneous QHS  . insulin aspart  0-9 Units Subcutaneous TID WC  . insulin glargine  20 Units Subcutaneous Daily  . multivitamin with minerals  1 tablet Oral Daily  . mupirocin cream   Topical Daily  . sodium chloride flush  3 mL Intravenous Q12H   OBJECTIVE  Vitals:   06/27/19 0100 06/27/19 0605 06/27/19 0608 06/27/19 0738  BP: (!) 160/70  (!) 169/74 (!) 142/58  Pulse:   66 64  Resp:   17 18  Temp:   98.3 F (36.8 C) 97.7 F (36.5 C)  TempSrc:   Oral Oral  SpO2:   98% 95%  Weight:  75.8 kg    Height:        Intake/Output Summary (Last 24 hours) at 06/27/2019 1108 Last data filed at 06/27/2019 0742 Gross per 24 hour  Intake 120 ml  Output 1700 ml  Net -1580 ml   Filed Weights   06/26/19 0320 06/27/19 0605  Weight: 78.6 kg 75.8 kg   PHYSICAL EXAM  General: Pleasant, NAD. Neuro: Alert and oriented X 3. Moves all extremities spontaneously. Psych: Normal affect. HEENT:  Normal  Neck: Supple without bruits or JVD. Lungs:  Resp regular and unlabored, CTA. Heart: RRR no s3, s4, or murmurs. Abdomen: Soft, non-tender, non-distended, BS + x 4.  Extremities: No clubbing, cyanosis or edema. DP/PT/Radials 2+ and equal bilaterally.  Accessory  Clinical Findings  CBC Recent Labs    06/26/19 1422 06/27/19 0438  WBC 2.9* 5.4  HGB 8.8* 8.5*  HCT 28.7* 27.5*  MCV 66.4* 65.9*  PLT 224 779   Basic Metabolic Panel Recent Labs    06/25/19 1038 06/26/19 1422 06/27/19 0438  NA 134* 138 140  K 5.4* 3.8 3.6  CL 100 103 104  CO2 24 27 26   GLUCOSE 223* 208* 131*  BUN 19 17 17   CREATININE 1.89* 1.89* 1.92*  CALCIUM 8.7* 8.6* 8.5*  MG 2.1  --   --    Radiology/Studies  Dg Chest 2 View  Result Date: 06/25/2019 CLINICAL DATA:  CHF EXAM: CHEST - 2 VIEW COMPARISON:  May 09, 2019 FINDINGS: Small bilateral pleural effusions, right greater than left. Associated basilar atelectasis. Normal heart size. No acute osseous abnormality. IMPRESSION: Small bilateral pleural effusions and associated basilar atelectasis. Electronically Signed   By: Macy Mis M.D.   On: 06/25/2019 10:55   Dg Foot Complete Left  Result Date: 06/04/2019 Please see detailed radiograph report in office note.   TELE: SR  ECG: no new tracing    ASSESSMENT AND PLAN  68 y.o. male with a hx of transient CHB, HTN, HLD, CKD III, PAD s/p left  anterior tibial angioplasty 05/30/2019 (followed by vascular), DM and prostate cancer who is being seen today for the evaluation of CHF.  The patient is a patient of Dr. Davina Poke, last seen on June 24, 2019 after she underwent 30-day event monitor that showed possible CHB vs Wenkebach. He has a very long PR interval at baseline (~350 msec) and given the recovery this could just be Wenkebach.  Dr. Davina Poke ordered 7-day Zio patch monitor to further evaluate.  1. Acute on chronic diastolic CHF - BNP 395.  Only 360 cc output reported., Doubt acute.  Weight 172 LB today (it was 180 in clinic 2 days ago).  Reports improvement on breathing.  Continue IV diuresis, likely change to p.o. -Recommended DASH diet with sodium restriction to 2 g/day. Poor in-site of health condition.  - Her echocardiogram shows LVEF 60 to 65%  mild LVH, grade 1 diastolic dysfunction normal RV function normal left atrial size, mild MR, otherwise normal echocardiogram.  Patient weight is down 5 pounds since yesterday, with improved creatinine on diuretics, stable crea,  improved lower extremity edema, but still fluid overloaded.  I will schedule a dietitian consultation.  His Zio patch has not been applied yet, we will continue following telemetry here so far he has no signs of AV block.  I would continue Lasix 40 mg IV twice daily for now, we will follow.  2. Acute on CKD III - Baseline Scr around 1.6. SCr 2.06>>1.89->1.92. -Follow closely with diuresis  3. Hyperkalemia - K of 5.9>>>5.4>> 3.6 -Continue to hold losartan -Likely due to AKI  4. Transient CHB 04/2019 - She did not got 30 days monitor as outpatient, reported due to family issue.  - Order placed for ZIO.  -No bradycardia arrhythmia on monitor while admitted  5. HTN -Minimally elevated -Home losartan on hold due to AKI and hyperkalemia -Avoid rate control agent given transient complete heart block previously -start amlodipine 2.5 mg po daily  6.  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  Peripheral vascular disease - s/p left anterior tibial angioplasty 05/30/2019 (followed by vascular) -Continue aspirin and Plavix  8.  Uncontrolled diabetes -Hemoglobin A1c was 10.47-month ago -On sliding scale insulin -Commended reviewing home medication strict control  9. HLD - 05/06/2019: Cholesterol 136; HDL 53; LDL Cholesterol 74; Triglycerides 47; VLDL 9  -Continue Lipitor 20 mg  10. Microcytic anemia- workup per primary team  For questions or updates, please contact [place] Please consult [email or web address] for contact info under   Signed, Ena Dawley MD, [facility] 06/27/2019

## 2019-06-27 NOTE — Telephone Encounter (Signed)
Patient is calling to inform Dr. Elmon Kirschner that he is currently at [facility], [place] wing, Rm. 15, was admitted 1 day ago.  He has a left foot blister that is huge and some other things going on.  He wanted to know if one of the physicians could come over and take a look at his ulcer.   Returned call to patient and informed (per Nurse Mateo Flow) that he has to get the physician at hospital were he is being treated  to send a referral to our office to have our doctors to pay him a visit there.  He verbally understood and said ok. He wanted to also thank the doctors Stover/Galloway and Liliane Channel as well.

## 2019-06-27 NOTE — TOC Initial Note (Addendum)
Transition of Care [facility]) - Initial/Assessment Note    Patient Details  Name: [name] MRN: [id number] Date of Birth: November 11, 1950  Transition of Care [facility]) CM/SW Contact:    Zenon Mayo, RN Phone Number: 06/27/2019, 1:58 PM  Clinical Narrative:                 NCM spoke with patient, he states he is having problems affording his 70/30 insulin that he gets from [facility], said sometimes it is in the 100's of dollars.  NCM notifed MD to consult diabetic educator to see patient, sometimes they have coupons and can make recs to MD .  Patient could probably do better with the reli- on brand.  NCM paged DM coordinator to inform about this information. Patient does have insurance , not eligible for Match.   Expected Discharge Plan: Home/Self Care Barriers to Discharge: No Barriers Identified   Patient Goals and CMS Choice Patient states their goals for this hospitalization and ongoing recovery are:: get better   Choice offered to / list presented to : NA  Expected Discharge Plan and Services Expected Discharge Plan: Home/Self Care In-house Referral: (Diabetic Coordinator) Discharge Planning Services: CM Consult Post Acute Care Choice: NA Living arrangements for the past 2 months: Single Family Home                 DME Arranged: (NA)         HH Arranged: NA          Prior Living Arrangements/Services Living arrangements for the past 2 months: Single Family Home Lives with:: Spouse Patient language and need for interpreter reviewed:: Yes Do you feel safe going back to the place where you live?: Yes      Need for Family Participation in Patient Care: No (Comment) Care giver support system in place?: No (comment)   Criminal Activity/Legal Involvement Pertinent to Current Situation/Hospitalization: No - Comment as needed  Activities of Daily Living Home Assistive Devices/Equipment: Walker (specify type)(front wheel ) ADL Screening (condition at time of  admission) Patient's cognitive ability adequate to safely complete daily activities?: Yes Is the patient deaf or have difficulty hearing?: No Does the patient have difficulty seeing, even when wearing glasses/contacts?: Yes Does the patient have difficulty concentrating, remembering, or making decisions?: Yes Patient able to express need for assistance with ADLs?: Yes Does the patient have difficulty dressing or bathing?: No Independently performs ADLs?: Yes (appropriate for developmental age) Does the patient have difficulty walking or climbing stairs?: Yes Weakness of Legs: None Weakness of Arms/Hands: None  Permission Sought/Granted                  Emotional Assessment Appearance:: Appears stated age Attitude/Demeanor/Rapport: Engaged Affect (typically observed): Appropriate Orientation: : Oriented to Self, Oriented to Place, Oriented to  Time, Oriented to Situation Alcohol / Substance Use: Not Applicable Psych Involvement: No (comment)  Admission diagnosis:  Hyperkalemia [E87.5] Renal insufficiency [N28.9] Hypervolemia, unspecified hypervolemia type [E87.70] Patient Active Problem List   Diagnosis Date Noted  . Acute on chronic diastolic CHF (congestive heart failure), NYHA class 3 ([place])   . Heart block AV complete ([place])   . Acute renal failure with acute tubular necrosis superimposed on stage 4 chronic kidney disease ([place])   . H/O small bowel obstruction 05/17/2019  . PAD (peripheral artery disease) ([place]) 05/14/2019  . Ulcer of left foot due to type 2 diabetes mellitus ([place])   . Skin ulcer of left foot, limited to breakdown of  skin ([place])   . Charcot foot due to diabetes mellitus ([place])   . Charcot's joint of foot, left   . SBO (small bowel obstruction) ([place]) 05/06/2019  . Complete heart block ([place]) 05/06/2019  . Moderate protein-calorie malnutrition ([place])   . Diabetic foot ulcer ([place]) 10/19/2018  . Diabetic foot infection ([place])   . Pseudophakia, both eyes  10/04/2018  . Stage 3 chronic kidney disease 09/27/2018  . Adrenal insufficiency ([place]) 04/09/2015  . Peripheral edema 09/17/2014  . Shortness of breath 09/17/2014  . Bronchospasm, acute 09/16/2014  . Partial small bowel obstruction ([place]) 09/16/2014  . Hyperglycemia   . Diabetic retinopathy associated with type 2 diabetes mellitus ([place]) 09/13/2014  . Abdominal pain 09/13/2014  . Cough 11/15/2012  . Pharyngitis 09/14/2012  . Fluid overload 09/03/2012  . Ileus following gastrointestinal surgery ([place]) 09/03/2012  . Hypokalemia 08/30/2012  . Small bowel obstruction s/p LOA [id number] 08/21/2012  . Abnormal EKG 08/21/2012  . Chest pain 08/21/2012  . Macular degeneration (senile) of retina 12/15/2011  . Lens replaced 12/15/2011  . Vitreous hemorrhage ([place]) 12/15/2011  . Essential hypertension 08/19/2008  . Regional enteritis of small intestine ([place]) 01/03/2008  . ERECTILE DYSFUNCTION, ORGANIC 01/03/2008  . Sarcoidosis 07/19/2007  . Diabetes mellitus ([place]) 07/19/2007  . HYPERLIPIDEMIA, MIXED 07/19/2007  . CERUMEN IMPACTION, BILATERAL 07/19/2007  . URINARY INCONTINENCE, STRESS, MALE 08/17/2006  . PROSTATE CANCER, HX OF 08/17/2006  . DIABETIC  RETINOPATHY 01/09/2002   PCP:  [name], MD Pharmacy:   [facility] [place], [address] AT [address] [place] [address] Phone: [phone] Fax: [phone]     Social Determinants of Health (SDOH) Interventions    Readmission Risk Interventions Readmission Risk Prevention Plan 06/27/2019 05/09/2019  Transportation Screening Complete Complete  PCP or Specialist Appt within 3-5 Days - Complete  HRI or [place] Complete Complete  Social Work Consult for [place] Planning/Counseling Complete Complete  Palliative Care Screening Not Applicable Not Applicable  Medication Review Press photographer) Complete Complete  Some recent data might be hidden

## 2019-06-27 NOTE — Plan of Care (Signed)

## 2019-06-27 NOTE — Progress Notes (Signed)
Inpatient Diabetes Program Recommendations  AACE/ADA: New Consensus Statement on Inpatient Glycemic Control (2015)  Target Ranges:  Prepandial:   less than 140 mg/dL      Peak postprandial:   less than 180 mg/dL (1-2 hours)      Critically ill patients:  140 - 180 mg/dL   Lab Results  Component Value Date   GLUCAP 134 (H) 06/27/2019   HGBA1C 10.3 (H) 05/06/2019    Review of Glycemic Control  Inpatient Diabetes Program Recommendations:   Spoke with patient @ bedside regarding change of 70/30 insulin to Novolin Relion which is $42 per box of pens. Patient states he cannot afford anything until after December 3rd. Relayed information to case manager Tomi Bamberger. Sent information to Dr. Kurtis Bushman for order number for Novolin Relion 70/30 insulin #[id number].  Patient states he feels that the Novolin insulin will be much more affordable than what he has been paying @ [facility] after he can buy it. Patient is ending short term disability and does not have long term disability.  Thank you, Nani Gasser. Troyce Febo, RN, MSN, CDE  Diabetes Coordinator Inpatient Glycemic Control Team Team Pager [phone] (8am-5pm) 06/27/2019 3:14 PM

## 2019-06-27 NOTE — Telephone Encounter (Signed)
Thank you. That's correct. The hospital doctors will treat him and if they need me they will call for a referral/consult. I will await to hear from the hospital if not he will follow up outpatient as scheduled. -Dr. [name]

## 2019-06-27 NOTE — TOC Progression Note (Signed)
Transition of Care [facility]) - Progression Note    Patient Details  Name: [name] MRN: [id number] Date of Birth: 06-Jul-1951  Transition of Care [facility]) CM/SW Contact  Zenon Mayo, RN Phone Number: 06/27/2019, 1:52 PM  Clinical Narrative:    NCM spoke with patient, he states he is having problems affording his 70/30 insulin that he gets from [facility], said sometimes it is in the 100's of dollars.  NCM notifed MD to consult diabetic educator to see patient, sometimes they have coupons and can make recs to MD .  Patient could probably do better with the reli- on brand.          Expected Discharge Plan and Services                                                 Social Determinants of Health (SDOH) Interventions    Readmission Risk Interventions Readmission Risk Prevention Plan 05/09/2019  Transportation Screening Complete  PCP or Specialist Appt within 3-5 Days Complete  HRI or [place] Complete  Social Work Consult for [place] Planning/Counseling Complete  Palliative Care Screening Not Applicable  Medication Review Press photographer) Complete  Some recent data might be hidden

## 2019-06-28 ENCOUNTER — Encounter: Payer: Self-pay | Admitting: Family Medicine

## 2019-06-28 ENCOUNTER — Ambulatory Visit: Payer: Medicare Other | Admitting: Family Medicine

## 2019-06-28 DIAGNOSIS — E8770 Fluid overload, unspecified: Secondary | ICD-10-CM

## 2019-06-28 LAB — BASIC METABOLIC PANEL
Anion gap: 12 (ref 5–15)
BUN: 21 mg/dL (ref 8–23)
CO2: 27 mmol/L (ref 22–32)
Calcium: 8.9 mg/dL (ref 8.9–10.3)
Chloride: 100 mmol/L (ref 98–111)
Creatinine, Ser: 1.85 mg/dL — ABNORMAL HIGH (ref 0.61–1.24)
GFR calc Af Amer: 42 mL/min — ABNORMAL LOW (ref >60)
GFR calc non Af Amer: 36 mL/min — ABNORMAL LOW (ref >60)
Glucose, Bld: 121 mg/dL — ABNORMAL HIGH (ref 70–99)
Potassium: 4 mmol/L (ref 3.5–5.1)
Sodium: 139 mmol/L (ref 135–145)

## 2019-06-28 LAB — GLUCOSE, CAPILLARY
Glucose-Capillary: 114 mg/dL — ABNORMAL HIGH (ref 70–99)
Glucose-Capillary: 157 mg/dL — ABNORMAL HIGH (ref 70–99)
Glucose-Capillary: 54 mg/dL — ABNORMAL LOW (ref 70–99)
Glucose-Capillary: 89 mg/dL (ref 70–99)
Glucose-Capillary: 328 mg/dL — ABNORMAL HIGH (ref 70–99)

## 2019-06-28 LAB — BRAIN NATRIURETIC PEPTIDE: B Natriuretic Peptide: 306.1 pg/mL — ABNORMAL HIGH (ref 0.0–100.0)

## 2019-06-28 LAB — OCCULT BLOOD X 1 CARD TO LAB, STOOL: Fecal Occult Bld: NEGATIVE

## 2019-06-28 MED ORDER — POLYETHYLENE GLYCOL 3350 17 G PO PACK
17.0000 g | PACK | Freq: Every day | ORAL | Status: DC
  Administered 2019-06-28 – 2019-06-29 (×2): 17 g via ORAL
  Filled 2019-06-28: qty 1

## 2019-06-28 MED ORDER — TORSEMIDE 20 MG PO TABS
60.0000 mg | ORAL_TABLET | Freq: Every day | ORAL | Status: DC
  Administered 2019-06-29: 60 mg via ORAL
  Filled 2019-06-28: qty 3

## 2019-06-28 NOTE — Progress Notes (Signed)
Patient family came to front desk-   Reported patient was sweating profusely, clammy, dizzy, and felt fatigue.     Went to room, assess patient (A and O) and HR was 65 bpm.  Obtained vital signs and CBG.    Patient also requested 3 beverages.   Explain to family- patient needs to watch his fluid intake. Too much fluid could cause fluid to build up.

## 2019-06-28 NOTE — Progress Notes (Addendum)
Progress Note  Patient Name: [name] Date of Encounter: 06/28/2019  Primary Cardiologist: Evalina Field, MD   Subjective   Mr. Ziebell reported feeling okay today, he denied any new complaints.  Discussed that we will be adjusting his medications and importance of decreasing his sodium intake.  Inpatient Medications    Scheduled Meds: . amLODipine  2.5 mg Oral Daily  . aspirin EC  81 mg Oral Daily  . atorvastatin  20 mg Oral q morning - 10a  . clopidogrel  75 mg Oral Daily  . docusate sodium  100 mg Oral BID  . enoxaparin (LOVENOX) injection  30 mg Subcutaneous Q24H  . feeding supplement (PRO-STAT SUGAR FREE 64)  30 mL Oral BID  . furosemide  40 mg Intravenous Q12H  . insulin aspart  0-5 Units Subcutaneous QHS  . insulin aspart  0-9 Units Subcutaneous TID WC  . insulin aspart protamine- aspart  15 Units Subcutaneous BID WC  . multivitamin with minerals  1 tablet Oral Daily  . mupirocin cream   Topical Daily  . senna  1 tablet Oral Daily  . sodium chloride flush  3 mL Intravenous Q12H   Continuous Infusions: . sodium chloride     PRN Meds: sodium chloride, acetaminophen, guaiFENesin, guaiFENesin-dextromethorphan, ondansetron **OR** ondansetron (ZOFRAN) IV, sodium chloride flush   Vital Signs    Vitals:   06/28/19 0015 06/28/19 0459 06/28/19 0846 06/28/19 0910  BP: (!) 143/64 (!) 165/73 (!) 151/68 (!) 129/96  Pulse: (!) 56 63 60 62  Resp: 18 18 18 18   Temp: 98.6 F (37 C) 98.9 F (37.2 C)  98.4 F (36.9 C)  TempSrc: Oral Oral  Oral  SpO2: 100% 100% 100% 100%  Weight:  74.4 kg    Height:        Intake/Output Summary (Last 24 hours) at 06/28/2019 1030 Last data filed at 06/28/2019 0913 Gross per 24 hour  Intake 1440 ml  Output 1000 ml  Net 440 ml   Filed Weights   06/26/19 0320 06/27/19 0605 06/28/19 0459  Weight: 78.6 kg 75.8 kg 74.4 kg   Telemetry    Normal sinus rhythm, heart rates in the 60s, first-degree AV block- Personally Reviewed   ECG    None today  Physical Exam   GEN: No acute distress.  Elderly male Neck: No JVD Cardiac: RRR, no murmurs, rubs, or gallops.  Respiratory: Clear to auscultation bilaterally.  Normal work of breathing GI: Soft, nontender, non-distended  MS: No LE edema; No deformity. Neuro:  Nonfocal  Psych: Normal affect   Labs    Chemistry Recent Labs  Lab 06/26/19 1422 06/27/19 0438 06/28/19 0533  NA 138 140 139  K 3.8 3.6 4.0  CL 103 104 100  CO2 27 26 27   GLUCOSE 208* 131* 121*  BUN 17 17 21   CREATININE 1.89* 1.92* 1.85*  CALCIUM 8.6* 8.5* 8.9  GFRNONAA 35* 35* 36*  GFRAA 41* 40* 42*  ANIONGAP 8 10 12      Hematology Recent Labs  Lab 06/25/19 1038 06/26/19 1422 06/27/19 0438  WBC 4.7 2.9* 5.4  RBC 4.51 4.32 4.17*  HGB 9.5* 8.8* 8.5*  HCT 30.5* 28.7* 27.5*  MCV 67.6* 66.4* 65.9*  MCH 21.1* 20.4* 20.4*  MCHC 31.1 30.7 30.9  RDW 16.0* 15.8* 15.8*  PLT 229 224 233    Cardiac EnzymesNo results for input(s): TROPONINI in the last 168 hours. No results for input(s): TROPIPOC in the last 168 hours.   BNP Recent  Labs  Lab 06/24/19 1200 06/25/19 1038 06/28/19 0748  BNP 273.1* 294.1* 306.1*     DDimer No results for input(s): DDIMER in the last 168 hours.   Radiology    No results found.  Cardiac Studies   05/06/2019 echocardiogram: IMPRESSIONS   1. Left ventricular ejection fraction, by visual estimation, is 60 to 65%. The left ventricle has normal function. Normal left ventricular size. Mildly increased left ventricular posterior wall thickness. There is no left ventricular hypertrophy.  2. Left ventricular diastolic Doppler parameters are consistent with impaired relaxation pattern of LV diastolic filling.  3. Global right ventricle has normal systolic function.The right ventricular size is normal. No increase in right ventricular wall thickness.  4. Left atrial size was normal.  5. Right atrial size was normal.  6. The mitral valve is normal in  structure. Mild mitral valve regurgitation. No evidence of mitral stenosis.  7. The tricuspid valve is normal in structure. Tricuspid valve regurgitation was not visualized by color flow Doppler.  8. The aortic valve is normal in structure. Aortic valve regurgitation is mild by color flow Doppler. Structurally normal aortic valve, with no evidence of sclerosis or stenosis.  9. The pulmonic valve was normal in structure. Pulmonic valve regurgitation is not visualized by color flow Doppler. 10. The inferior vena cava is normal in size with greater than 50% respiratory variability, suggesting right atrial pressure of 3 mmHg.     Patient Profile     68 y.o. male with history of transient complete heart block hypertension, hyperlipidemia, CKD stage III, PAD status post tibial angioplasty 05/30/2019, diabetes, and prostate cancer who presented with heart failure.   Assessment & Plan    1.  Acute on chronic diastolic heart failure: -Presented with worsening shortness of breath, lower extremity edema, and diffuse swelling.  Endorsed noncompliance with diuretics and sodium restriction. BNP elevated at 294.  Weight in clinic on 06/24/19 was 180 lbs, on admission it was 173 lbs, today 164 lbs.  Echocardiogram shows EF 60 to 65%, mild LVH, grade 1 diastolic dysfunction, otherwise normal echocardiogram.  Patient is being diuresed with IV lasix 40 mg BID. Input of 1.2 L, output 1.5 L, net output of 350cc.  On exam he does not appear significantly fluid overloaded.  This is likely due to dietary indiscretion and missing his home medications. -Patient stable for discharge later today, lasix discharge dose per attending -Strict I+Os, daily weights -Advised importance of decreasing sodium intake  2.  Acute on chronic kidney disease stage III: -Baseline creatinine around 1.6.  Admission creatinine 2.06, today it is 1.85.  Patient was being diuresed with IV Lasix.  3.  Hyperkalemia: Today K is 4.0.  Resume  losartan.  4.  Transient complete heart block in 04/2019: -Zio ordered.   5. HTN: BP today ranged 129/96-151/68. On losartan at home which is being held in setting of AKI and hyperkalemia.  Started amlodipine yesterday. -Continue amlodipine 2.5 mg daily  6. PVD: S/p left anterior tibial angioplasty 05/30/19. Continue ASA and plavix.   7. Diabetes: Per primary 8. HLD: Continue home lipitor 20 mg daily 9. Microcytic anemia: Per primary  for questions or updates, please contact [place] Please consult [email or web address] for contact info under Cardiology/STEMI.   Signed, Asencion Noble, MD  06/28/2019, 10:30 AM    The patient was seen, examined and discussed with Asencion Noble, MD and I agree with the above.   The patient diuresed a significant amount of fluids, I would  switch to torsemide 60 mg po daily, the mainstay of therapy here will be medication sand diet compliance.   [facility] will sign off.   Medication Recommendations:  As above.  Other recommendations (labs, testing, etc):  No further testing. Follow up as an outpatient:  As needed.  Ena Dawley, MD 06/28/2019

## 2019-06-28 NOTE — Progress Notes (Signed)
Hypoglycemic Event  CBG: 54 (1336)  Treatment: 8 oz orange juice  Symptoms: Sweating, Dizziness  Follow-up CBG: Time:1356 CBG Result:89  Comments/MD notified: MD Yetta Barre

## 2019-06-28 NOTE — Progress Notes (Signed)
PROGRESS NOTE    [name]  MBT:[id number] DOB: 01/18/1951 DOA: 06/25/2019 PCP: [name], MD    Brief Narrative:  68 year old male with history of type 2 diabetes mellitus, chronic diastolic CHF, chronic kidney disease stage III, PAD, prostate cancer was sent to the emergency room yesterday due to abnormal labs namely worsening kidney function and potassium of 5.9 with volume overloadedstate.  He was seen at his cardiologist and had reported increased swelling, lower extremity edema, and shortness of breath -Patient admits poor compliance with diuretics and salt restriction    Consultants:   Orthopedic, cardiology  Procedures: None  Antimicrobials:   None   Subjective: Patient reports breathing better. Feels weak. No cp. Felt diaphoretic and clammy. Ck bg per nsg was 54, and given OJ with bg 89  Objective: Vitals:   06/28/19 0846 06/28/19 0910 06/28/19 1226 06/28/19 1600  BP: (!) 151/68 (!) 129/96 135/62 (!) 141/60  Pulse: 60 62 61 63  Resp: 18 18 (!) 21 16  Temp:  98.4 F (36.9 C) 99.1 F (37.3 C) 97.9 F (36.6 C)  TempSrc:  Oral Oral Oral  SpO2: 100% 100% 98% 100%  Weight:      Height:        Intake/Output Summary (Last 24 hours) at 06/28/2019 1832 Last data filed at 06/28/2019 1603 Gross per 24 hour  Intake 2100 ml  Output 1450 ml  Net 650 ml   Filed Weights   06/26/19 0320 06/27/19 0605 06/28/19 0459  Weight: 78.6 kg 75.8 kg 74.4 kg    Examination: General exam: Appears calm and comfortable , wife at bedside, HEENT: EOMI, no jvd Respiratory system: Clear to auscultation. Respiratory effort normal. Cardiovascular system: S1 & S2 heard, RRR. no murmurs, rubs, gallops Gastrointestinal system: Abdomen is nondistended, soft and nontender.  Normal bowel sounds heard. Central nervous system: Alert and oriented. No focal neurological deficits. Extremities: Decreased lower extremity edema Skin: Warm and dry Psychiatry:  Mood & affect  appropriate    Data Reviewed: I have personally reviewed following labs and imaging studies  CBC: Recent Labs  Lab 06/25/19 1038 06/26/19 1422 06/27/19 0438  WBC 4.7 2.9* 5.4  HGB 9.5* 8.8* 8.5*  HCT 30.5* 28.7* 27.5*  MCV 67.6* 66.4* 65.9*  PLT 229 224 536   Basic Metabolic Panel: Recent Labs  Lab 06/24/19 1200 06/25/19 1038 06/26/19 1422 06/27/19 0438 06/28/19 0533  NA 139 134* 138 140 139  K 5.9* 5.4* 3.8 3.6 4.0  CL 99 100 103 104 100  CO2 23 24 27 26 27   GLUCOSE 252* 223* 208* 131* 121*  BUN 23 19 17 17 21   CREATININE 2.06* 1.89* 1.89* 1.92* 1.85*  CALCIUM 9.3 8.7* 8.6* 8.5* 8.9  MG  --  2.1  --   --   --    GFR: Estimated Creatinine Clearance: 33.5 mL/min (A) (by C-G formula based on SCr of 1.85 mg/dL (H)). Liver Function Tests: No results for input(s): AST, ALT, ALKPHOS, BILITOT, PROT, ALBUMIN in the last 168 hours. No results for input(s): LIPASE, AMYLASE in the last 168 hours. No results for input(s): AMMONIA in the last 168 hours. Coagulation Profile: No results for input(s): INR, PROTIME in the last 168 hours. Cardiac Enzymes: No results for input(s): CKTOTAL, CKMB, CKMBINDEX, TROPONINI in the last 168 hours. BNP (last 3 results) No results for input(s): PROBNP in the last 8760 hours. HbA1C: No results for input(s): HGBA1C in the last 72 hours. CBG: Recent Labs  Lab 06/27/19 2111  06/28/19 0610 06/28/19 1223 06/28/19 1531 06/28/19 1555  GLUCAP 179* 114* 157* 54* 89   Lipid Profile: No results for input(s): CHOL, HDL, LDLCALC, TRIG, CHOLHDL, LDLDIRECT in the last 72 hours. Thyroid Function Tests: No results for input(s): TSH, T4TOTAL, FREET4, T3FREE, THYROIDAB in the last 72 hours. Anemia Panel: No results for input(s): VITAMINB12, FOLATE, FERRITIN, TIBC, IRON, RETICCTPCT in the last 72 hours. Sepsis Labs: No results for input(s): PROCALCITON, LATICACIDVEN in the last 168 hours.  Recent Results (from the past 240 hour(s))  SARS  CORONAVIRUS 2 (TAT 6-24 HRS) Nasopharyngeal Nasopharyngeal Swab     Status: None   Collection Time: 06/25/19  7:45 PM   Specimen: Nasopharyngeal Swab  Result Value Ref Range Status   SARS Coronavirus 2 NEGATIVE NEGATIVE Final    Comment: (NOTE) SARS-CoV-2 target nucleic acids are NOT DETECTED. The SARS-CoV-2 RNA is generally detectable in upper and lower respiratory specimens during the acute phase of infection. Negative results do not preclude SARS-CoV-2 infection, do not rule out co-infections with other pathogens, and should not be used as the sole basis for treatment or other patient management decisions. Negative results must be combined with clinical observations, patient history, and epidemiological information. The expected result is Negative. Fact Sheet for Patients: SugarRoll.be Fact Sheet for Healthcare Providers: https://www.woods-mathews.com/ This test is not yet approved or cleared by the [place] FDA and  has been authorized for detection and/or diagnosis of SARS-CoV-2 by FDA under an Emergency Use Authorization (EUA). This EUA will remain  in effect (meaning this test can be used) for the duration of the COVID-19 declaration under Section 56 4(b)(1) of the Act, 21 U.S.C. section 360bbb-3(b)(1), unless the authorization is terminated or revoked sooner. Performed at [facility], [place] [address], [place], [place] [address]          Radiology Studies: No results found.      Scheduled Meds: . amLODipine  2.5 mg Oral Daily  . aspirin EC  81 mg Oral Daily  . atorvastatin  20 mg Oral q morning - 10a  . clopidogrel  75 mg Oral Daily  . docusate sodium  100 mg Oral BID  . enoxaparin (LOVENOX) injection  30 mg Subcutaneous Q24H  . feeding supplement (PRO-STAT SUGAR FREE 64)  30 mL Oral BID  . insulin aspart  0-5 Units Subcutaneous QHS  . insulin aspart  0-9 Units Subcutaneous TID WC  . insulin aspart  protamine- aspart  15 Units Subcutaneous BID WC  . multivitamin with minerals  1 tablet Oral Daily  . mupirocin cream   Topical Daily  . polyethylene glycol  17 g Oral Daily  . senna  1 tablet Oral Daily  . sodium chloride flush  3 mL Intravenous Q12H  . [START ON 06/29/2019] torsemide  60 mg Oral Daily   Continuous Infusions: . sodium chloride      Assessment & Plan:   Active Problems:   Fluid overload   PVD (peripheral vascular disease) (HCC)   Acute on chronic diastolic CHF (congestive heart failure), NYHA class 3 (HCC)   Heart block AV complete (HCC)   Acute renal failure with acute tubular necrosis superimposed on stage 4 chronic kidney disease (HCC)   Hyperkalemia  1.Acute on chronic diastolic CHF -noncompliant with diuretics and diet . -Clinically improving, more euvolemic on exam --Was recently changed prior to this admission to torsemide and losartan -Last echo 04/2019 with EF of 60 to 01%,BPZWC 1 diastolic dysfunction  -cards changed lasix to  torsemide 60 mg po.  - I/os, daily weights -Cards following -Monitor labs   2. Acute on CKD III - Baseline Scr around 1.6 -Likely cardiorenal, worsened by recent ARB use -stabilizing, but will monitor. - ARB on hold  3. Hyperkalemia -In the setting of renal insufficiency and ARB use -losartan discontinued -Levels now stable, will continue to monitor.  4.History of transient complete heart block in 04/2019  -Monitor on telemetry  5. HTN -Stable, monitor with diuresis  6.Peripheral vascular disease - s/p left anterior tibial angioplasty 05/30/2019 (followed by vascular) -Continue aspirin and Plavix -History of left third toe amputation  7.Uncontrolled diabetes -Last hemoglobin A1c was 10.3  on Lantus, sliding scale insulin -RD consulted FS low this afternoon. Will hold his evening novolin and mx with riss. For discharge: need to d/c on Novolin Relion 70/30 pen [phone] which is more affordable to the  pt  DVT prophylaxis:Lovenox Code Status:Full code Family Communication:No family at bedside Disposition Plan:possible d/c in 1-2 days if stable.        LOS: 2 days   Time spent: 5 minutes and more than 50% on [place], MD [facility] Pager [phone]  If 7PM-7AM, please contact night-coverage [email or web address] Password [facility] 06/28/2019, 6:32 PM

## 2019-06-29 DIAGNOSIS — I739 Peripheral vascular disease, unspecified: Secondary | ICD-10-CM

## 2019-06-29 DIAGNOSIS — E1165 Type 2 diabetes mellitus with hyperglycemia: Secondary | ICD-10-CM

## 2019-06-29 LAB — BASIC METABOLIC PANEL
Anion gap: 12 (ref 5–15)
BUN: 25 mg/dL — ABNORMAL HIGH (ref 8–23)
CO2: 30 mmol/L (ref 22–32)
Calcium: 8.8 mg/dL — ABNORMAL LOW (ref 8.9–10.3)
Chloride: 95 mmol/L — ABNORMAL LOW (ref 98–111)
Creatinine, Ser: 2.06 mg/dL — ABNORMAL HIGH (ref 0.61–1.24)
GFR calc Af Amer: 37 mL/min — ABNORMAL LOW (ref >60)
GFR calc non Af Amer: 32 mL/min — ABNORMAL LOW (ref >60)
Glucose, Bld: 218 mg/dL — ABNORMAL HIGH (ref 70–99)
Potassium: 4 mmol/L (ref 3.5–5.1)
Sodium: 137 mmol/L (ref 135–145)

## 2019-06-29 LAB — GLUCOSE, CAPILLARY
Glucose-Capillary: 194 mg/dL — ABNORMAL HIGH (ref 70–99)
Glucose-Capillary: 214 mg/dL — ABNORMAL HIGH (ref 70–99)
Glucose-Capillary: 107 mg/dL — ABNORMAL HIGH (ref 70–99)

## 2019-06-29 MED ORDER — AMLODIPINE BESYLATE 2.5 MG PO TABS
2.5000 mg | ORAL_TABLET | Freq: Once | ORAL | Status: AC
  Administered 2019-06-29: 2.5 mg via ORAL
  Filled 2019-06-29: qty 1

## 2019-06-29 MED ORDER — AMLODIPINE BESYLATE 5 MG PO TABS: 5.0000 mg | ORAL_TABLET | Freq: Every day | ORAL | 0 refills | Status: DC

## 2019-06-29 NOTE — Progress Notes (Signed)
Patient is walking in the hallway.  He is slightly weaker that normal (he stated). He said he feels ready to go home.

## 2019-06-29 NOTE — Progress Notes (Signed)
Patient b/p was 177/71 during d/c vitals.  Dr Kurtis Bushman was notified and he added 2.5mg  of amarodone before the patient leaves.

## 2019-06-29 NOTE — Plan of Care (Signed)
Patient walked in hall.  He stated he is feeling better and is ready to go home.

## 2019-06-29 NOTE — Discharge Summary (Signed)
[name] LHT:[id number] DOB: Feb 11, 1951 DOA: 06/25/2019  PCP: [name], MD  Admit date: 06/25/2019 Discharge date: 06/29/2019  Admitted From: Home Disposition: Home  Recommendations for Outpatient Follow-up:  1. Follow up with PCP in 1 week 2. Please obtain BMP/CBC in one week 3. Please follow up on the following pending results:  Home Health: None   Discharge Condition:Stable CODE STATUS: Full Diet recommendation: Heart Healthy , low-sodium, avoid foods high in potassium content  Brief/Interim Summary: [name]  is a 68 y.o. male, with past medical history significant for chronic renal failure, and diabetes mellitus type 2 who was sent by his cardiologist  to be evaluated for acute on chronic renal failure, hyperkalemia and worsening fluid overload.    Cardiology was consulted and they saw the patient.  Patient admits to poor compliance with diuretics and salt restriction.  He was started on IV diuretics and switch to p.o. torsemide.  His potassium level on the day of admission as outpatient was 5.9 and when he was admitted it was 5.4.  His losartan was held.  He was started on amlodipine for his elevated blood pressure since losartan was held.  Once he was euvolemic and stable cardiology had switched his IV diuretics to torsemide p.o.  Patient was extensively educated on low potassium and sodium diet.  He did have acute on chronic kidney disease and it appears that he is at his baseline.  His hyperkalemia resolved and from his chronic diastolic heart failure which acutely had worsened he is compensated today.  Uncontrolled with last hemoglobin was 10.3.  He was instructed to follow-up with his primary care for further evaluation and needs to watch his diet.  He is stable to be discharged home denies any shortness of breath, chest pain, or any other symptoms.    Discharge Diagnoses:  Active Problems:   Fluid overload   PVD (peripheral vascular disease) (HCC)   Acute  on chronic diastolic CHF (congestive heart failure), NYHA class 3 (HCC)   Heart block AV complete (HCC)   Acute renal failure with acute tubular necrosis superimposed on stage 4 chronic kidney disease (HCC)   Hyperkalemia    Discharge Instructions  Discharge Instructions    Call MD for:  difficulty breathing, headache or visual disturbances   Complete by: As directed    Call MD for:  temperature >100.4   Complete by: As directed    Diet - low sodium heart healthy   Complete by: As directed    Discharge instructions   Complete by: As directed    F/u with pcp about diabetes, foot issues, and needs bmp in 3-5 days F/u with cardiology in one week Avoid salty food, canned foods. Limit sodium intake to 1557m per day Limit high potassium foods such as potatoes and bananas   Increase activity slowly   Complete by: As directed      Allergies as of 06/29/2019      Reactions   Ace Inhibitors Itching, Cough   Naproxen Anaphylaxis, Shortness Of Breath, Other (See Comments)   Throat swells. cannot breathe, and causes GI distress      Medication List    STOP taking these medications   losartan 50 MG tablet Commonly known as: COZAAR   torsemide 100 MG tablet Commonly known as: DEMADEX     TAKE these medications   acetaminophen 650 MG CR tablet Commonly known as: TYLENOL Take 650 mg by mouth every 8 (eight) hours as needed for pain.  amLODipine 5 MG tablet Commonly known as: NORVASC Take 1 tablet (5 mg total) by mouth daily. Start taking on: June 30, 2019   aspirin 81 MG chewable tablet Chew 81 mg by mouth every morning.   atorvastatin 20 MG tablet Commonly known as: LIPITOR Take 20 mg by mouth every morning.   blood glucose meter kit and supplies Kit Dispense based on patient and insurance preference. Use up to four times daily as directed. (FOR ICD-9 250.00, 250.01).   Centrum Silver 50+Men Tabs Take 1 tablet by mouth daily with breakfast.   clopidogrel 75 MG  tablet Commonly known as: PLAVIX Take 1 tablet (75 mg total) by mouth daily with breakfast.   docusate sodium 100 MG capsule Commonly known as: COLACE Take 100 mg by mouth daily as needed for mild constipation.   freestyle lancets Use as instructed   Lancets 30G Misc   gabapentin 300 MG capsule Commonly known as: NEURONTIN TAKE 1 CAPSULE(300 MG) BY MOUTH THREE TIMES DAILY What changed: See the new instructions.   guaiFENesin 600 MG 12 hr tablet Commonly known as: MUCINEX Take 600 mg by mouth 2 (two) times daily as needed for to loosen phlegm.   Insulin Isophane & Regular Human (70-30) 100 UNIT/ML PEN Commonly known as: HUMULIN 70/30 MIX Inject 16-20 Units into the skin daily before breakfast AND 16-20 Units daily before supper. What changed:   how much to take  how to take this  when to take this  additional instructions   mupirocin ointment 2 % Commonly known as: BACTROBAN Apply to left foot once daily   PEN NEEDLES 31GX5/16" 31G X 8 MM Misc Use new needle with each insulin injection, twice a day. Dx E11.65, Z79.4   SYSTANE OP Place 1 drop into both eyes as needed (for dryness).       Allergies  Allergen Reactions  . Ace Inhibitors Itching and Cough  . Naproxen Anaphylaxis, Shortness Of Breath and Other (See Comments)    Throat swells. cannot breathe, and causes GI distress    Consultations:  Cardiology, diabetic educator, wound nsg   Procedures/Studies: Dg Chest 2 View  Result Date: 06/25/2019 CLINICAL DATA:  CHF EXAM: CHEST - 2 VIEW COMPARISON:  May 09, 2019 FINDINGS: Small bilateral pleural effusions, right greater than left. Associated basilar atelectasis. Normal heart size. No acute osseous abnormality. IMPRESSION: Small bilateral pleural effusions and associated basilar atelectasis. Electronically Signed   By: Macy Mis M.D.   On: 06/25/2019 10:55   Dg Foot Complete Left  Result Date: 06/04/2019 Please see detailed radiograph  report in office note.      Subjective: Patient has no complaints.  Denies any shortness of breath, chest pain, abdominal pain nausea vomiting.  Discharge Exam: Vitals:   06/29/19 0542 06/29/19 0700  BP: (!) 181/69 (!) 165/66  Pulse: 64 62  Resp: 18 16  Temp: 98.4 F (36.9 C) 98.9 F (37.2 C)  SpO2: 98% 100%   Vitals:   06/28/19 1600 06/28/19 2007 06/29/19 0542 06/29/19 0700  BP: (!) 141/60 133/69 (!) 181/69 (!) 165/66  Pulse: 63 68 64 62  Resp: '16 16 18 16  ' Temp: 97.9 F (36.6 C) 98.6 F (37 C) 98.4 F (36.9 C) 98.9 F (37.2 C)  TempSrc: Oral Oral Oral Oral  SpO2: 100% 100% 98% 100%  Weight:   73.6 kg   Height:        General: Pt is alert, awake, not in acute distress Cardiovascular: RRR, S1/S2 +, no rubs,  no gallops Respiratory: CTA bilaterally, no wheezing, no rhonchi Abdominal: Soft, NT, ND, bowel sounds + Extremities: edema improved, no cyanosis. Lt foot dressing in place    The results of significant diagnostics from this hospitalization (including imaging, microbiology, ancillary and laboratory) are listed below for reference.     Microbiology: Recent Results (from the past 240 hour(s))  SARS CORONAVIRUS 2 (TAT 6-24 HRS) Nasopharyngeal Nasopharyngeal Swab     Status: None   Collection Time: 06/25/19  7:45 PM   Specimen: Nasopharyngeal Swab  Result Value Ref Range Status   SARS Coronavirus 2 NEGATIVE NEGATIVE Final    Comment: (NOTE) SARS-CoV-2 target nucleic acids are NOT DETECTED. The SARS-CoV-2 RNA is generally detectable in upper and lower respiratory specimens during the acute phase of infection. Negative results do not preclude SARS-CoV-2 infection, do not rule out co-infections with other pathogens, and should not be used as the sole basis for treatment or other patient management decisions. Negative results must be combined with clinical observations, patient history, and epidemiological information. The expected result is Negative. Fact  Sheet for Patients: SugarRoll.be Fact Sheet for Healthcare Providers: https://www.woods-mathews.com/ This test is not yet approved or cleared by the [place] FDA and  has been authorized for detection and/or diagnosis of SARS-CoV-2 by FDA under an Emergency Use Authorization (EUA). This EUA will remain  in effect (meaning this test can be used) for the duration of the COVID-19 declaration under Section 56 4(b)(1) of the Act, 21 U.S.C. section 360bbb-3(b)(1), unless the authorization is terminated or revoked sooner. Performed at [facility], [place] [address], [place], [place] [address]      Labs: BNP (last 3 results) Recent Labs    06/24/19 1200 06/25/19 1038 06/28/19 0748  BNP 273.1* 294.1* 244.9*   Basic Metabolic Panel: Recent Labs  Lab 06/25/19 1038 06/26/19 1422 06/27/19 0438 06/28/19 0533 06/29/19 0544  NA 134* 138 140 139 137  K 5.4* 3.8 3.6 4.0 4.0  CL 100 103 104 100 95*  CO2 '24 27 26 27 30  ' GLUCOSE 223* 208* 131* 121* 218*  BUN '19 17 17 21 ' 25*  CREATININE 1.89* 1.89* 1.92* 1.85* 2.06*  CALCIUM 8.7* 8.6* 8.5* 8.9 8.8*  MG 2.1  --   --   --   --    Liver Function Tests: No results for input(s): AST, ALT, ALKPHOS, BILITOT, PROT, ALBUMIN in the last 168 hours. No results for input(s): LIPASE, AMYLASE in the last 168 hours. No results for input(s): AMMONIA in the last 168 hours. CBC: Recent Labs  Lab 06/25/19 1038 06/26/19 1422 06/27/19 0438  WBC 4.7 2.9* 5.4  HGB 9.5* 8.8* 8.5*  HCT 30.5* 28.7* 27.5*  MCV 67.6* 66.4* 65.9*  PLT 229 224 233   Cardiac Enzymes: No results for input(s): CKTOTAL, CKMB, CKMBINDEX, TROPONINI in the last 168 hours. BNP: Invalid input(s): POCBNP CBG: Recent Labs  Lab 06/28/19 1531 06/28/19 1555 06/28/19 2109 06/29/19 0650 06/29/19 0732  GLUCAP 54* 89 328* 194* 214*   D-Dimer No results for input(s): DDIMER in the last 72 hours. Hgb A1c No results for input(s):  HGBA1C in the last 72 hours. Lipid Profile No results for input(s): CHOL, HDL, LDLCALC, TRIG, CHOLHDL, LDLDIRECT in the last 72 hours. Thyroid function studies No results for input(s): TSH, T4TOTAL, T3FREE, THYROIDAB in the last 72 hours.  Invalid input(s): FREET3 Anemia work up No results for input(s): VITAMINB12, FOLATE, FERRITIN, TIBC, IRON, RETICCTPCT in the last 72 hours. Urinalysis    Component  Value Date/Time   COLORURINE YELLOW 09/03/2015 1505   APPEARANCEUR CLEAR 09/03/2015 1505   LABSPEC 1.035 (H) 09/03/2015 1505   PHURINE 6.0 09/03/2015 1505   GLUCOSEU >1000 (A) 09/03/2015 1505   HGBUR NEGATIVE 09/03/2015 1505   HGBUR negative 08/19/2008 1530   BILIRUBINUR NEGATIVE 09/03/2015 1505   KETONESUR NEGATIVE 09/03/2015 1505   PROTEINUR NEGATIVE 09/03/2015 1505   UROBILINOGEN 0.2 04/08/2015 0456   NITRITE NEGATIVE 09/03/2015 1505   LEUKOCYTESUR NEGATIVE 09/03/2015 1505   Sepsis Labs Invalid input(s): PROCALCITONIN,  WBC,  LACTICIDVEN Microbiology Recent Results (from the past 240 hour(s))  SARS CORONAVIRUS 2 (TAT 6-24 HRS) Nasopharyngeal Nasopharyngeal Swab     Status: None   Collection Time: 06/25/19  7:45 PM   Specimen: Nasopharyngeal Swab  Result Value Ref Range Status   SARS Coronavirus 2 NEGATIVE NEGATIVE Final    Comment: (NOTE) SARS-CoV-2 target nucleic acids are NOT DETECTED. The SARS-CoV-2 RNA is generally detectable in upper and lower respiratory specimens during the acute phase of infection. Negative results do not preclude SARS-CoV-2 infection, do not rule out co-infections with other pathogens, and should not be used as the sole basis for treatment or other patient management decisions. Negative results must be combined with clinical observations, patient history, and epidemiological information. The expected result is Negative. Fact Sheet for Patients: SugarRoll.be Fact Sheet for Healthcare  Providers: https://www.woods-mathews.com/ This test is not yet approved or cleared by the [place] FDA and  has been authorized for detection and/or diagnosis of SARS-CoV-2 by FDA under an Emergency Use Authorization (EUA). This EUA will remain  in effect (meaning this test can be used) for the duration of the COVID-19 declaration under Section 56 4(b)(1) of the Act, 21 U.S.C. section 360bbb-3(b)(1), unless the authorization is terminated or revoked sooner. Performed at [facility], [place] [address], [place], [place] [address]      Time coordinating discharge: Over 30 minutes  SIGNED:   Nolberto Hanlon, MD  [facility] 06/29/2019, 11:50 AM Pager   If 7PM-7AM, please contact night-coverage [email or web address] Password [email or web address]

## 2019-06-29 NOTE — Plan of Care (Signed)

## 2019-07-01 NOTE — Progress Notes (Signed)
Cardiology Office Note:   Date:  07/02/2019  NAME:  [name]    MRN: [id number] DOB:  11-May-1951   PCP:  [name], MD  Cardiologist:  Evalina Field, MD  Electrophysiologist:  None   Referring MD: [name], MD   Chief Complaint  Patient presents with  . Congestive Heart Failure   History of Present Illness:   [name] is a 68 y.o. male with a hx of DM, HTN, PAD s/p left anterior tibial angioplasty 05/30/2019), pulmonary sarcoidosis, CKD who presents for follow-up of HFpEF. He was seen last week and was significantly volume overloaded. Labs showed hyperkalemia and AKI. He was sent to the hospital and admitted 11/10-11/14 for diuresis.  He is lost nearly 20 pounds since I saw him last.  Kidney function remained stable with creatinine around 2.0.  His blood pressure is elevated today but he did not take any of his medications this morning.  He has not been on a diuretic since discharge.  He reports significant improvement in his edema and he almost has none today.  He also reports no shortness of breath with exertion and he is able to climb several flights of stairs without limitations.  He was not able to do heavy exertion prior to this.  His neck veins are flat on examination as well.  He does report that he is going to see his vascular surgeon for ultrasound today.  He remains on aspirin and Plavix.  His ARB was held upon discharge.  He reports he feels much better since his hospitalization and removal of fluid.  He denies any chest pain, shortness of breath, palpitations, lower extreme edema today.   Past Medical History: Past Medical History:  Diagnosis Date  . Cellulitis and abscess of face 10/11/2007   Qualifier: Diagnosis of  By: Amil Amen MD, Benjamine Mola    . Diabetic retinopathy ([place])   . Diabetic retinopathy associated with type 2 diabetes mellitus ([place]) 09/13/2014   He works for [employer]  . Fluid overload 09/03/2012   Post op  . GERD  (gastroesophageal reflux disease)   . Visual impairment     Past Surgical History: Past Surgical History:  Procedure Laterality Date  . ABDOMINAL AORTOGRAM W/LOWER EXTREMITY Left 05/30/2019   Procedure: ABDOMINAL AORTOGRAM W/LOWER EXTREMITY;  Surgeon: Marty Heck, MD;  Location: [name];  Service: Cardiovascular;  Laterality: Left;  . CATARACT EXTRACTION W/ INTRAOCULAR LENS  IMPLANT, BILATERAL    . EYE SURGERY Bilateral    "laser OR for diabetic retinopathy"  . INGUINAL HERNIA REPAIR     [name] 07/12/2010), "don't remember which side"  . LAPAROTOMY  08/23/2012   Procedure: EXPLORATORY LAPAROTOMY;  Surgeon: [name], DO;  Location: [facility];  Service: General;  Laterality: N/A;  exploratory laparotomy with lysis of adhesions  . LYSIS OF ADHESION  08/23/2012   Procedure: LYSIS OF ADHESION;  Surgeon: [name], DO;  Location: [facility];  Service: General;;  . PERIPHERAL VASCULAR BALLOON ANGIOPLASTY  05/30/2019   Procedure: PERIPHERAL VASCULAR BALLOON ANGIOPLASTY;  Surgeon: Marty Heck, MD;  Location: [facility];  Service: Cardiovascular;;  left anterior tibial  . ROBOT ASSISTED LAPAROSCOPIC RADICAL PROSTATECTOMY  2000's   "had to finish manually after machine broke"  . SHOULDER SURGERY  1970's   separation; from playing football"    Current Medications: Current Meds  Medication Sig  . acetaminophen (TYLENOL) 650 MG CR tablet Take 650 mg by  mouth every 8 (eight) hours as needed for pain.  [name] amLODipine (NORVASC) 10 MG tablet Take 1 tablet (10 mg total) by mouth daily.  [name] aspirin 81 MG chewable tablet Chew 81 mg by mouth every morning.   [name] atorvastatin (LIPITOR) 20 MG tablet Take 20 mg by mouth every morning.   . blood glucose meter kit and supplies KIT Dispense based on patient and insurance preference. Use up to four times daily as directed. (FOR ICD-9 250.00, 250.01).  [name] clopidogrel (PLAVIX) 75 MG tablet Take 1 tablet (75 mg total) by mouth daily with  breakfast.  . docusate sodium (COLACE) 100 MG capsule Take 100 mg by mouth daily as needed for mild constipation.  . gabapentin (NEURONTIN) 300 MG capsule TAKE 1 CAPSULE(300 MG) BY MOUTH THREE TIMES DAILY (Patient taking differently: Take 300 mg by mouth 2 (two) times daily. Take 300 mg by mouth in the morning and 300 mg at bedtime- may take an additional 300 mg once a day as needed/as directed)  . guaiFENesin (MUCINEX) 600 MG 12 hr tablet Take 600 mg by mouth 2 (two) times daily as needed for to loosen phlegm.  . Insulin Isophane & Regular Human (HUMULIN 70/30 MIX) (70-30) 100 UNIT/ML PEN Inject 16-20 Units into the skin daily before breakfast AND 16-20 Units daily before supper. (Patient taking differently: Inject 16-20 Units into the skin See admin instructions. Inject 16-20 Units into the skin daily before breakfast AND 16-20 Units daily before supper)  . Insulin Pen Needle (PEN NEEDLES 31GX5/16") 31G X 8 MM MISC Use new needle with each insulin injection, twice a day. Dx E11.65, Z79.4  . Lancets (FREESTYLE) lancets Use as instructed  . Lancets 30G MISC   . Multiple Vitamins-Minerals (CENTRUM SILVER 50+MEN) TABS Take 1 tablet by mouth daily with breakfast.  . mupirocin ointment (BACTROBAN) 2 % Apply to left foot once daily  . Polyethyl Glycol-Propyl Glycol (SYSTANE OP) Place 1 drop into both eyes as needed (for dryness).   . [DISCONTINUED] amLODipine (NORVASC) 5 MG tablet Take 1 tablet (5 mg total) by mouth daily.     Allergies:    Ace inhibitors and Naproxen   Social History: Social History   Socioeconomic History  . Marital status: Married    Spouse name: Not on file  . Number of children: Not on file  . Years of education: Not on file  . Highest education level: Not on file  Occupational History  . Not on file  Social Needs  . Financial resource strain: Not on file  . Food insecurity    Worry: Not on file    Inability: Not on file  . Transportation needs    Medical: Not on  file    Non-medical: Not on file  Tobacco Use  . Smoking status: Former Smoker    Packs/day: 2.50    Years: 3.00    Pack years: 7.50    Types: Cigarettes    Quit date: 11/09/1966    Years since quitting: 52.6  . Smokeless tobacco: Never Used  Substance and Sexual Activity  . Alcohol use: Yes    Comment: 04/08/2015 "maybe a beer/ or 2 or a glass of wine monthly"  . Drug use: No  . Sexual activity: Yes  Lifestyle  . Physical activity    Days per week: Not on file    Minutes per session: Not on file  . Stress: Not on file  Relationships  . Social Herbalist on phone:  Not on file    Gets together: Not on file    Attends religious service: Not on file    Active member of club or organization: Not on file    Attends meetings of clubs or organizations: Not on file    Relationship status: Not on file  Other Topics Concern  . Not on file  Social History Narrative  . Not on file     Family History: The patient's family history includes Diabetes Mellitus II in his mother. There is no history of Colon cancer.  ROS:   All other ROS reviewed and negative. Pertinent positives noted in the HPI.     EKGs/Labs/Other Studies Reviewed:   The following studies were personally reviewed by me today:  TTE 05/06/2019  1. Left ventricular ejection fraction, by visual estimation, is 60 to 65%. The left ventricle has normal function. Normal left ventricular size. Mildly increased left ventricular posterior wall thickness. There is no left ventricular hypertrophy.  2. Left ventricular diastolic Doppler parameters are consistent with impaired relaxation pattern of LV diastolic filling.  3. Global right ventricle has normal systolic function.The right ventricular size is normal. No increase in right ventricular wall thickness.  4. Left atrial size was normal.  5. Right atrial size was normal.  6. The mitral valve is normal in structure. Mild mitral valve regurgitation. No evidence of mitral  stenosis.  7. The tricuspid valve is normal in structure. Tricuspid valve regurgitation was not visualized by color flow Doppler.  8. The aortic valve is normal in structure. Aortic valve regurgitation is mild by color flow Doppler. Structurally normal aortic valve, with no evidence of sclerosis or stenosis.  9. The pulmonic valve was normal in structure. Pulmonic valve regurgitation is not visualized by color flow Doppler. 10. The inferior vena cava is normal in size with greater than 50% respiratory variability, suggesting right atrial pressure of 3 mmHg.  Recent Labs: 05/06/2019: TSH 1.370 05/17/2019: ALT 31 06/25/2019: Magnesium 2.1 06/27/2019: Hemoglobin 8.5; Platelets 233 06/28/2019: B Natriuretic Peptide 306.1 06/29/2019: BUN 25; Creatinine, Ser 2.06; Potassium 4.0; Sodium 137   Recent Lipid Panel    Component Value Date/Time   CHOL 136 05/06/2019 0520   CHOL 158 04/19/2019 1013   TRIG 47 05/06/2019 0520   HDL 53 05/06/2019 0520   HDL 53 04/19/2019 1013   CHOLHDL 2.6 05/06/2019 0520   VLDL 9 05/06/2019 0520   LDLCALC 74 05/06/2019 0520   LDLCALC 87 04/19/2019 1013    Physical Exam:   VS:  BP (!) 170/71   Pulse 67   Temp 98.6 F (37 C)   Ht _0  (1.676 m)   Wt 166 lb (75.3 kg)   SpO2 100%   BMI 26.79 kg/m    Wt Readings from Last 3 Encounters:  07/02/19 166 lb (75.3 kg)  06/29/19 162 lb 3.2 oz (73.6 kg)  06/24/19 180 lb (81.6 kg)    General: Well nourished, well developed, in no acute distress Heart: Atraumatic, normal size  Eyes: PEERLA, EOMI  Neck: Supple, no JVD Endocrine: No thryomegaly Cardiac: Normal S1, S2; RRR; no murmurs, rubs, or gallops Lungs: Clear to auscultation bilaterally, no wheezing, rhonchi or rales  Abd: Soft, nontender, no hepatomegaly  Ext: Trace edema Musculoskeletal: No deformities, BUE and BLE strength normal and equal Skin: Warm and dry, no rashes   Neuro: Alert and oriented to person, place, time, and situation, CNII-XII grossly  intact, no focal deficits  Psych: Normal mood and affect  ASSESSMENT:   [name] is a 68 y.o. male who presents for the following: 1. Chronic heart failure with preserved ejection fraction (HCC)   2. 1st degree AV block   3. Essential hypertension   4. PAD (peripheral artery disease) ([place])     PLAN:   1. Chronic heart failure with preserved ejection fraction (HCC) -Was recently admitted for volume overload and has lost nearly 20 pounds of fluid.  Given his significant CKD we will continue to hold his ARB.  We will restart his amlodipine 10 mg daily.  Will prescribe torsemide 40 mg every other day.  He was counseled significantly on fluid intake restrictions as well as salt restrictions.  Given his decompensated heart failure and HFpEF I think it is prudent to proceed with a Lexiscan nuclear medicine stress test.  There is a significant association with CAD in his condition and given his PAD is high risk for this.  2. 1st degree AV block -EKG with rather significant for sure AV block around 350 ms.  He has no symptoms of lightheadedness or dizziness is able to get around without any issues.  When he was admitted to the hospital with a bowel obstruction in September he was noted to have complete heart block versus likely Wenckebach.  Given the irregular nature of the rhythm I suspect this probably was Wenckebach.  He was unable to complete a recent event monitor due to social situations, and we have resent a monitor for him to complete.  We will hold any beta-blockers or calcium channel blockers moving forward given his significant conduction disease.  I have no strong indication for beta-blocker at this time and we do not need to pursue any pacing therapies.  3. Essential hypertension -Elevated today we will restart his amlodipine 10 mg daily.  Torsemide as above.  4. PAD (peripheral artery disease) ([place]) -Recent lower extremity angioplasty.  Continue aspirin Plavix he will follow-up  with his vascular surgeon today.  Most recent LDL cholesterol 74.  We will continue aspirin, Lipitor, Plavix for now.   Disposition: Return in about 1 month (around 08/01/2019).  Medication Adjustments/Labs and Tests Ordered: Current medicines are reviewed at length with the patient today.  Concerns regarding medicines are outlined above.  Orders Placed This Encounter  Procedures  . Myocardial Perfusion Imaging   Meds ordered this encounter  Medications  . torsemide (DEMADEX) 20 MG tablet    Sig: Take 2 tablets (40 mg total) by mouth every other day.    Dispense:  180 tablet    Refill:  3  . amLODipine (NORVASC) 10 MG tablet    Sig: Take 1 tablet (10 mg total) by mouth daily.    Dispense:  90 tablet    Refill:  3    Patient Instructions  Medication Instructions:  Start Torsemide 40 mg every other day. Increase Amlodipine 10 mg daily.  *If you need a refill on your cardiac medications before your next appointment, please call your pharmacy*  Testing/Procedures: Your physician has requested that you have a lexiscan myoview. A cardiac stress test is a cardiological test that measures the heart's ability to respond to external stress in a controlled clinical environment. The stress response is induced by intravenous pharmacological stimulation.   Follow-Up: At [facility], you and your health needs are our priority.  As part of our continuing mission to provide you with exceptional heart care, we have created designated Provider Care Teams.  These Care Teams include your primary Cardiologist (  physician) and Advanced Practice Providers (APPs -  Physician Assistants and Nurse Practitioners) who all work together to provide you with the care you need, when you need it.  Your next appointment:   1 month  The format for your next appointment:   In Person  Provider:   Eleonore Chiquito, MD       Signed, Addison Naegeli. Audie Box, [facility]  [address]  [place], [place] [address] [phone]  07/02/2019 11:56 AM

## 2019-07-01 NOTE — Progress Notes (Signed)
HPI: Patient is a 68 year old male who presented with nonhealing left foot wound since February of this year.   He had largely noncompressible ABIs with monophasic waveform at the ankle.  He presents today for aortogram left lower extremity arteriogram and possible intervention.  Plan is to use CO2 given severe underlying CKD.  CO2 aortogram showed no flow-limiting aortoiliac stenosis. Left lower extremity arteriogram with CO2 showed a patent common femoral profunda and SFA.  He did have some mild disease in the popliteal artery but this did not appear flow-limiting.  Dominant runoff in the left lower extremity was via the posterior tibial that was in line with really no significant disease.  Peroneal was also patent with a 70 to 80% stenosis at the ostium.  Anterior tibial appeared to have multifocal 80 to 95% stenosis throughout an approximate 20 cm segment but the artery was very robust distally.  Left anterior tibial artery angioplasty  He is here today for f/u repeat ABI's and Lower extremity arterial duplex.  He has had no significant changes with his left plantar foot wound.  He did develop a medial heel blood blister when he wore regula tennis shoes to walk a dog.  He is being followed as an out patient by Dr. Alveria Apley.  He denise fever and chills.    Past Medical History:  Diagnosis Date  . Cellulitis and abscess of face 10/11/2007   Qualifier: Diagnosis of  By: Amil Amen MD, Benjamine Mola    . Diabetic retinopathy ([place])   . Diabetic retinopathy associated with type 2 diabetes mellitus ([place]) 09/13/2014   He works for [employer]  . Fluid overload 09/03/2012   Post op  . GERD (gastroesophageal reflux disease)   . Visual impairment     Past Surgical History:  Procedure Laterality Date  . ABDOMINAL AORTOGRAM W/LOWER EXTREMITY Left 05/30/2019   Procedure: ABDOMINAL AORTOGRAM W/LOWER EXTREMITY;  Surgeon: Marty Heck, MD;  Location: [facility];  Service:  Cardiovascular;  Laterality: Left;  . CATARACT EXTRACTION W/ INTRAOCULAR LENS  IMPLANT, BILATERAL    . EYE SURGERY Bilateral    "laser OR for diabetic retinopathy"  . INGUINAL HERNIA REPAIR     [name] 07/12/2010), "don't remember which side"  . LAPAROTOMY  08/23/2012   Procedure: EXPLORATORY LAPAROTOMY;  Surgeon: [name], DO;  Location: [facility];  Service: General;  Laterality: N/A;  exploratory laparotomy with lysis of adhesions  . LYSIS OF ADHESION  08/23/2012   Procedure: LYSIS OF ADHESION;  Surgeon: [name], DO;  Location: [facility];  Service: General;;  . PERIPHERAL VASCULAR BALLOON ANGIOPLASTY  05/30/2019   Procedure: PERIPHERAL VASCULAR BALLOON ANGIOPLASTY;  Surgeon: Marty Heck, MD;  Location: [facility];  Service: Cardiovascular;;  left anterior tibial  . ROBOT ASSISTED LAPAROSCOPIC RADICAL PROSTATECTOMY  2000's   "had to finish manually after machine broke"  . SHOULDER SURGERY  1970's   separation; from playing football"    ROS:   General:  No weight loss, Fever, chills  HEENT: No recent headaches, no nasal bleeding, no visual changes, no sore throat  Neurologic: No dizziness, blackouts, seizures. No recent symptoms of stroke or mini- stroke. No recent episodes of slurred speech, or temporary blindness.  Cardiac: No recent episodes of chest pain/pressure, no shortness of breath at rest.  No shortness of breath with exertion.  Denies history of atrial fibrillation or irregular heartbeat  Vascular: No history of rest pain in feet.  No history  of claudication.  positive history of non-healing ulcer, No history of DVT   Pulmonary: No home oxygen, no productive cough, no hemoptysis,  No asthma or wheezing  Musculoskeletal:  _0  Arthritis, _1  Low back pain,  _2  Joint pain  Hematologic:No history of hypercoagulable state.  No history of easy bleeding.  No history of anemia  Gastrointestinal: No hematochezia or melena,  No gastroesophageal reflux, no trouble  swallowing  Urinary: [x ] chronic Kidney disease, _3  on HD - _4  MWF or _5  TTHS, _6  Burning with urination, _7  Frequent urination, _8  Difficulty urinating;   Skin: No rashes  Psychological: No history of anxiety,  No history of depression  Social History Social History   Tobacco Use  . Smoking status: Former Smoker    Packs/day: 2.50    Years: 3.00    Pack years: 7.50    Types: Cigarettes    Quit date: 11/09/1966    Years since quitting: 52.6  . Smokeless tobacco: Never Used  Substance Use Topics  . Alcohol use: Yes    Comment: 04/08/2015 "maybe a beer/ or 2 or a glass of wine monthly"  . Drug use: No    Family History Family History  Problem Relation Age of Onset  . Diabetes Mellitus II Mother   . Colon cancer Neg Hx     Allergies  Allergies  Allergen Reactions  . Ace Inhibitors Itching and Cough  . Naproxen Anaphylaxis, Shortness Of Breath and Other (See Comments)    Throat swells. cannot breathe, and causes GI distress     Current Outpatient Medications  Medication Sig Dispense Refill  . acetaminophen (TYLENOL) 650 MG CR tablet Take 650 mg by mouth every 8 (eight) hours as needed for pain.    [name] amLODipine (NORVASC) 10 MG tablet Take 1 tablet (10 mg total) by mouth daily. 90 tablet 3  . aspirin 81 MG chewable tablet Chew 81 mg by mouth every morning.     [name] atorvastatin (LIPITOR) 20 MG tablet Take 20 mg by mouth every morning.     . blood glucose meter kit and supplies KIT Dispense based on patient and insurance preference. Use up to four times daily as directed. (FOR ICD-9 250.00, 250.01). 1 each 1  . clopidogrel (PLAVIX) 75 MG tablet Take 1 tablet (75 mg total) by mouth daily with breakfast. 30 tablet 3  . docusate sodium (COLACE) 100 MG capsule Take 100 mg by mouth daily as needed for mild constipation.    . gabapentin (NEURONTIN) 300 MG capsule TAKE 1 CAPSULE(300 MG) BY MOUTH THREE TIMES DAILY (Patient taking differently: Take 300 mg by mouth 2 (two) times  daily. Take 300 mg by mouth in the morning and 300 mg at bedtime- may take an additional 300 mg once a day as needed/as directed) 90 capsule 0  . guaiFENesin (MUCINEX) 600 MG 12 hr tablet Take 600 mg by mouth 2 (two) times daily as needed for to loosen phlegm.    . Insulin Isophane & Regular Human (HUMULIN 70/30 MIX) (70-30) 100 UNIT/ML PEN Inject 16-20 Units into the skin daily before breakfast AND 16-20 Units daily before supper. (Patient taking differently: Inject 16-20 Units into the skin See admin instructions. Inject 16-20 Units into the skin daily before breakfast AND 16-20 Units daily before supper) 15 mL   . Insulin Pen Needle (PEN NEEDLES 31GX5/16") 31G X 8 MM MISC Use new needle with each insulin injection, twice a day. Dx  E11.65, Z79.4 100 each 5  . Lancets (FREESTYLE) lancets Use as instructed 100 each 0  . Lancets 30G MISC     . Multiple Vitamins-Minerals (CENTRUM SILVER 50+MEN) TABS Take 1 tablet by mouth daily with breakfast.    . mupirocin ointment (BACTROBAN) 2 % Apply to left foot once daily 22 g 0  . Polyethyl Glycol-Propyl Glycol (SYSTANE OP) Place 1 drop into both eyes as needed (for dryness).     . torsemide (DEMADEX) 20 MG tablet Take 2 tablets (40 mg total) by mouth every other day. 180 tablet 3   No current facility-administered medications for this visit.     Physical Examination  Vitals:   07/02/19 1409  BP: (!) 192/80  Pulse: 67  Resp: 18  Temp: 97.8 F (36.6 C)  TempSrc: Temporal  SpO2: 100%  Weight: 166 lb (75.3 kg)  Height: _0  (1.676 m)    Body mass index is 26.79 kg/m.  General:  Alert and oriented, no acute distress HEENT: Normal Neck: No bruit or JVD Pulmonary: Clear to auscultation bilaterally Cardiac: Regular Rate and Rhythm without murmur Abdomen: Soft, non-tender, non-distended, no mass, no scars Skin: No rash Extremity Pulses:  2+ radial, brachial, femoral,  Doppler dorsalis pedis, posterior tibial bilaterally Musculoskeletal: Chronic  brawny skin changes B LE with moderate edema on the left LE.   Neurologic: Upper and lower extremity motor 5/5 and symmetric, known peripheral neuropathy        DATA:    ABI Findings: +---------+------------------+-----+---------+--------+ Right    Rt Pressure (mmHg)IndexWaveform Comment  +---------+------------------+-----+---------+--------+ Brachial 190                    triphasic         +---------+------------------+-----+---------+--------+ PTA      240               1.26 triphasic         +---------+------------------+-----+---------+--------+ DP       205               1.08 triphasic         +---------+------------------+-----+---------+--------+ [facility]               0.65 Abnormal          +---------+------------------+-----+---------+--------+  +---------+------------------+-----+---------+-------+ Left     Lt Pressure (mmHg)IndexWaveform Comment +---------+------------------+-----+---------+-------+ Brachial 188                    triphasic        +---------+------------------+-----+---------+-------+ PTA      244               1.28 triphasic        +---------+------------------+-----+---------+-------+ DP       206               1.08 triphasic        +---------+------------------+-----+---------+-------+ Cleotis Nipper               0.67 Abnormal         +---------+------------------+-----+---------+-------+  +-------+-----------+-----------+------------+------------+ ABI/TBIToday's ABIToday's TBIPrevious ABIPrevious TBI +-------+-----------+-----------+------------+------------+ Right  1.26       0.65       1.14        0.59         +-------+-----------+-----------+------------+------------+ Left   1.28       0.67       1.03        0.23         +-------+-----------+-----------+------------+------------+      +-----------+--------+-----+---------------+----------+--------+  LEFT       PSV cm/sRatioStenosis       Waveform  Comments +-----------+--------+-----+---------------+----------+--------+ CFA Prox   153          30-49% stenosisbiphasic           +-----------+--------+-----+---------------+----------+--------+ CFA Mid    119                         biphasic           +-----------+--------+-----+---------------+----------+--------+ CFA Distal 122                         biphasic           +-----------+--------+-----+---------------+----------+--------+ DFA        93                          biphasic           +-----------+--------+-----+---------------+----------+--------+ SFA Prox   116                         monophasic         +-----------+--------+-----+---------------+----------+--------+ SFA Mid    366          50-74% stenosismonophasicwith PST +-----------+--------+-----+---------------+----------+--------+ SFA Distal 142                         monophasic         +-----------+--------+-----+---------------+----------+--------+ POP Prox   133                         monophasic         +-----------+--------+-----+---------------+----------+--------+ POP Mid    269          50-74% stenosismonophasicwith PST +-----------+--------+-----+---------------+----------+--------+ POP Distal 177                         monophasic         +-----------+--------+-----+---------------+----------+--------+ ATA Prox   95                          monophasic         +-----------+--------+-----+---------------+----------+--------+ ATA Mid    362          50-74% stenosismonophasicwith PST +-----------+--------+-----+---------------+----------+--------+ ATA Distal 123                         monophasic         +-----------+--------+-----+---------------+----------+--------+ PTA Prox   137                         monophasic          +-----------+--------+-----+---------------+----------+--------+ PTA Mid    143                         monophasic         +-----------+--------+-----+---------------+----------+--------+ PTA Distal 93                          monophasic         +-----------+--------+-----+---------------+----------+--------+ PERO Prox  56  monophasic         +-----------+--------+-----+---------------+----------+--------+ PERO Mid   52                          monophasic         +-----------+--------+-----+---------------+----------+--------+ PERO Distal57                          monophasic         +-----------+--------+-----+---------------+----------+--------+  A focal velocity elevation of was obtained at Mid Superficial Artery. Findings are characteristic of 50-74% stenosis. A 2nd focal velocity elevation was visualized, measuring at Mid Popliteal Artery. Findings are characteristic of 50-74% stenosis. A 3rd focal velocity elevation was visualized, measuring at Mid Anterior Tibial Artery. Findings are characteristic of 50-74% stenosis.     Summary: Left: 30-49% stenosis noted in the proximal common femoral artery. 50-74% stenosis noted in the mid superficial femoral artery. 50-74% stenosis noted in the mid popliteal artery. 50-74% stenosis noted in the mid anterior tibial artery.   ASSESSMENT:  PAD s/p  Left anterior tibial artery angioplasty For left foot non healing ulcer with associated DM. His studies show improved arterial blood flow with patentent 3 vessel run off.    PLAN:  F/U with Foot and Ankle specialist Dr. [name] DPM for local left foot wound care. We will have him f/u for repat arterial and ABI studies in 3 months.  His studies show narrowing, but improved arterial blood flow.  This should be enough to heal his wound.     We will continue aspirin, Lipitor, Plavix   Roxy Horseman, PA-C [facility] Office: [phone]  MD in clinic [place]

## 2019-07-02 ENCOUNTER — Encounter: Payer: Self-pay | Admitting: Cardiovascular Disease

## 2019-07-02 ENCOUNTER — Ambulatory Visit (INDEPENDENT_AMBULATORY_CARE_PROVIDER_SITE_OTHER): Payer: Medicare Other | Admitting: Physician Assistant

## 2019-07-02 ENCOUNTER — Ambulatory Visit (HOSPITAL_COMMUNITY)
Admission: RE | Admit: 2019-07-02 | Discharge: 2019-07-02 | Disposition: A | Discharge status: Home / Self Care | Payer: Medicare Other | Source: Ambulatory Visit | Attending: Vascular Surgery | Admitting: Vascular Surgery

## 2019-07-02 ENCOUNTER — Other Ambulatory Visit: Payer: Self-pay

## 2019-07-02 ENCOUNTER — Ambulatory Visit: Payer: Medicare Other | Admitting: Cardiovascular Disease

## 2019-07-02 ENCOUNTER — Ambulatory Visit (INDEPENDENT_AMBULATORY_CARE_PROVIDER_SITE_OTHER)
Admission: RE | Admit: 2019-07-02 | Discharge: 2019-07-02 | Disposition: A | Discharge status: Home / Self Care | Payer: Medicare Other | Source: Ambulatory Visit | Attending: Vascular Surgery | Admitting: Vascular Surgery

## 2019-07-02 ENCOUNTER — Ambulatory Visit (INDEPENDENT_AMBULATORY_CARE_PROVIDER_SITE_OTHER): Payer: Medicare Other | Admitting: Cardiovascular Disease

## 2019-07-02 VITALS — BP 192/80 | HR 67 | Temp 97.8°F | Resp 18 | Ht 66.0 in | Wt 166.0 lb

## 2019-07-02 VITALS — BP 170/71 | HR 67 | Temp 98.6°F | Ht 66.0 in | Wt 166.0 lb

## 2019-07-02 DIAGNOSIS — I739 Peripheral vascular disease, unspecified: Secondary | ICD-10-CM

## 2019-07-02 DIAGNOSIS — I10 Essential (primary) hypertension: Secondary | ICD-10-CM | POA: Diagnosis not present

## 2019-07-02 DIAGNOSIS — L97929 Non-pressure chronic ulcer of unspecified part of left lower leg with unspecified severity: Secondary | ICD-10-CM

## 2019-07-02 DIAGNOSIS — I5032 Chronic diastolic (congestive) heart failure: Secondary | ICD-10-CM | POA: Diagnosis not present

## 2019-07-02 DIAGNOSIS — I440 Atrioventricular block, first degree: Secondary | ICD-10-CM

## 2019-07-02 MED ORDER — TORSEMIDE 20 MG PO TABS: 40.0000 mg | ORAL_TABLET | ORAL | 3 refills | Status: DC

## 2019-07-02 MED ORDER — AMLODIPINE BESYLATE 10 MG PO TABS: 10.0000 mg | ORAL_TABLET | Freq: Every day | ORAL | 3 refills | Status: DC

## 2019-07-02 NOTE — Patient Instructions (Signed)
Medication Instructions:  Start Torsemide 40 mg every other day. Increase Amlodipine 10 mg daily.  *If you need a refill on your cardiac medications before your next appointment, please call your pharmacy*  Testing/Procedures: Your physician has requested that you have a lexiscan myoview. A cardiac stress test is a cardiological test that measures the heart's ability to respond to external stress in a controlled clinical environment. The stress response is induced by intravenous pharmacological stimulation.   Follow-Up: At [facility], you and your health needs are our priority.  As part of our continuing mission to provide you with exceptional heart care, we have created designated Provider Care Teams.  These Care Teams include your primary Cardiologist (physician) and Advanced Practice Providers (APPs -  Physician Assistants and Nurse Practitioners) who all work together to provide you with the care you need, when you need it.  Your next appointment:   1 month  The format for your next appointment:   In Person  Provider:   Eleonore Chiquito, MD

## 2019-07-03 ENCOUNTER — Telehealth: Payer: Self-pay | Admitting: *Deleted

## 2019-07-03 ENCOUNTER — Telehealth (HOSPITAL_COMMUNITY): Payer: Self-pay | Admitting: *Deleted

## 2019-07-03 NOTE — Telephone Encounter (Signed)
Close encounter 

## 2019-07-03 NOTE — Telephone Encounter (Signed)
I called pt and asked if the bruise looked like the beginning of an ulcer and pt asked which one. I told pt either one, and he said the right heel looked like the beginning of an ulcer. Pt is scheduled to see Dr. Paulla Dolly 07/04/2019 2:15am. I told pt to stay off the foot, allow heel to hang off the end of a pillow until seen tomorrow. Pt states understanding.

## 2019-07-03 NOTE — Telephone Encounter (Signed)
Pt states his friend's pit bull ate his boot and he now has nasty bruise due to wearing sneakers.

## 2019-07-04 ENCOUNTER — Ambulatory Visit (INDEPENDENT_AMBULATORY_CARE_PROVIDER_SITE_OTHER): Payer: Medicare Other | Admitting: Podiatry

## 2019-07-04 ENCOUNTER — Encounter: Payer: Self-pay | Admitting: Podiatry

## 2019-07-04 ENCOUNTER — Other Ambulatory Visit: Payer: Self-pay

## 2019-07-04 ENCOUNTER — Ambulatory Visit (HOSPITAL_COMMUNITY)
Admission: RE | Admit: 2019-07-04 | Payer: Medicare Other | Source: Ambulatory Visit | Attending: Cardiovascular Disease | Admitting: Cardiovascular Disease

## 2019-07-04 DIAGNOSIS — E11610 Type 2 diabetes mellitus with diabetic neuropathic arthropathy: Secondary | ICD-10-CM

## 2019-07-04 DIAGNOSIS — I739 Peripheral vascular disease, unspecified: Secondary | ICD-10-CM

## 2019-07-04 DIAGNOSIS — E11621 Type 2 diabetes mellitus with foot ulcer: Secondary | ICD-10-CM | POA: Diagnosis not present

## 2019-07-04 DIAGNOSIS — L97422 Non-pressure chronic ulcer of left heel and midfoot with fat layer exposed: Secondary | ICD-10-CM | POA: Diagnosis not present

## 2019-07-05 ENCOUNTER — Other Ambulatory Visit: Payer: Self-pay

## 2019-07-05 DIAGNOSIS — I739 Peripheral vascular disease, unspecified: Secondary | ICD-10-CM

## 2019-07-08 ENCOUNTER — Encounter (HOSPITAL_COMMUNITY): Payer: Self-pay | Admitting: Cardiovascular Disease

## 2019-07-08 NOTE — Progress Notes (Signed)
Subjective:   Patient ID: [name], male   DOB: 68 y.o.   MRN: [id number]   HPI Patient presents stating here to get the bottom of his left foot checked and also the back and was concerned about some discoloration occurring within the area   ROS      Objective:  Physical Exam  Vascular status significantly compromised with patient working with vascular with breakdown of tissue of a mild nature left fifth metatarsal localized and a discoloration of the area on the posterior aspect of the left heel that appears to be a dried blister.  There is no active drainage noted currently no odor no proximal edema erythema     Assessment:  Appears to be more of tissue abrasion secondary to inappropriate shoe gear choice with no indications of active infection currently     Plan:  Reviewed the condition that the patient has with significant vascular disease.  At this point I have recommended the continuation of being careful that the patient does need diabetic shoes and the fact that consistent daily observations are important.  No active drainage to continue the same treatment plan will be seen back for regular visit and is encouraged to come in if any drainage or other pathology were to occur

## 2019-07-09 ENCOUNTER — Telehealth (HOSPITAL_COMMUNITY): Payer: Self-pay | Admitting: *Deleted

## 2019-07-09 ENCOUNTER — Ambulatory Visit (INDEPENDENT_AMBULATORY_CARE_PROVIDER_SITE_OTHER): Payer: Medicare Other | Admitting: Sports Medicine

## 2019-07-09 ENCOUNTER — Other Ambulatory Visit: Payer: Medicare Other | Admitting: Orthotics

## 2019-07-09 ENCOUNTER — Other Ambulatory Visit: Payer: Self-pay

## 2019-07-09 DIAGNOSIS — L97422 Non-pressure chronic ulcer of left heel and midfoot with fat layer exposed: Secondary | ICD-10-CM

## 2019-07-09 DIAGNOSIS — E11610 Type 2 diabetes mellitus with diabetic neuropathic arthropathy: Secondary | ICD-10-CM | POA: Diagnosis not present

## 2019-07-09 DIAGNOSIS — I739 Peripheral vascular disease, unspecified: Secondary | ICD-10-CM

## 2019-07-09 DIAGNOSIS — E11621 Type 2 diabetes mellitus with foot ulcer: Secondary | ICD-10-CM

## 2019-07-09 MED ORDER — SULFAMETHOXAZOLE-TRIMETHOPRIM 400-80 MG PO TABS: 1.0000 | ORAL_TABLET | Freq: Two times a day (BID) | ORAL | 0 refills | Status: DC

## 2019-07-09 NOTE — Progress Notes (Signed)
Subjective: [name] is a 68 y.o. male patient seen in office for follow-up evaluation of ulceration of the left foot.  Patient reports that it has gotten worse since being in hospital with increased swelling or drainage and dog ate previous shoe so got blistering.  Last A1c was recorded at 10 and last blood sugar on yesterday was 135. Denies nausea/fever/vomiting/chills/night sweats/shortness of breath/pain. Patient has no other pedal complaints at this time.  Patient Active Problem List   Diagnosis Date Noted  . Uncontrolled type 2 diabetes mellitus with hyperglycemia ([place])   . Hyperkalemia   . Acute on chronic diastolic CHF (congestive heart failure), NYHA class 3 ([place])   . Heart block AV complete ([place])   . Acute renal failure with acute tubular necrosis superimposed on stage 4 chronic kidney disease ([place])   . H/O small bowel obstruction 05/17/2019  . PVD (peripheral vascular disease) ([place]) 05/14/2019  . Ulcer of left foot due to type 2 diabetes mellitus ([place])   . Skin ulcer of left foot, limited to breakdown of skin ([place])   . Charcot foot due to diabetes mellitus ([place])   . Charcot's joint of foot, left   . SBO (small bowel obstruction) ([place]) 05/06/2019  . Complete heart block ([place]) 05/06/2019  . Moderate protein-calorie malnutrition ([place])   . Diabetic foot ulcer ([place]) 10/19/2018  . Diabetic foot infection ([place])   . Pseudophakia, both eyes 10/04/2018  . Stage 3 chronic kidney disease 09/27/2018  . Adrenal insufficiency ([place]) 04/09/2015  . Peripheral edema 09/17/2014  . Shortness of breath 09/17/2014  . Bronchospasm, acute 09/16/2014  . Partial small bowel obstruction ([place]) 09/16/2014  . Hyperglycemia   . Diabetic retinopathy associated with type 2 diabetes mellitus ([place]) 09/13/2014  . Abdominal pain 09/13/2014  . Cough 11/15/2012  . Pharyngitis 09/14/2012  . Fluid overload 09/03/2012  . Ileus following gastrointestinal surgery ([place]) 09/03/2012  . Hypokalemia  08/30/2012  . Small bowel obstruction s/p LOA [id number] 08/21/2012  . Abnormal EKG 08/21/2012  . Chest pain 08/21/2012  . Macular degeneration (senile) of retina 12/15/2011  . Lens replaced 12/15/2011  . Vitreous hemorrhage ([place]) 12/15/2011  . Essential hypertension 08/19/2008  . Regional enteritis of small intestine ([place]) 01/03/2008  . ERECTILE DYSFUNCTION, ORGANIC 01/03/2008  . Sarcoidosis 07/19/2007  . Diabetes mellitus ([place]) 07/19/2007  . HYPERLIPIDEMIA, MIXED 07/19/2007  . CERUMEN IMPACTION, BILATERAL 07/19/2007  . URINARY INCONTINENCE, STRESS, MALE 08/17/2006  . PROSTATE CANCER, HX OF 08/17/2006  . DIABETIC  RETINOPATHY 01/09/2002   Current Outpatient Medications on File Prior to Visit  Medication Sig Dispense Refill  . acetaminophen (TYLENOL) 650 MG CR tablet Take 650 mg by mouth every 8 (eight) hours as needed for pain.    [name] amLODipine (NORVASC) 10 MG tablet Take 1 tablet (10 mg total) by mouth daily. 90 tablet 3  . aspirin 81 MG chewable tablet Chew 81 mg by mouth every morning.     [name] atorvastatin (LIPITOR) 20 MG tablet Take 20 mg by mouth every morning.     . blood glucose meter kit and supplies KIT Dispense based on patient and insurance preference. Use up to four times daily as directed. (FOR ICD-9 250.00, 250.01). 1 each 1  . clopidogrel (PLAVIX) 75 MG tablet Take 1 tablet (75 mg total) by mouth daily with breakfast. 30 tablet 3  . docusate sodium (COLACE) 100 MG capsule Take 100 mg by mouth daily as needed for mild constipation.    . gabapentin (NEURONTIN) 300 MG capsule  TAKE 1 CAPSULE(300 MG) BY MOUTH THREE TIMES DAILY (Patient taking differently: Take 300 mg by mouth 2 (two) times daily. Take 300 mg by mouth in the morning and 300 mg at bedtime- may take an additional 300 mg once a day as needed/as directed) 90 capsule 0  . guaiFENesin (MUCINEX) 600 MG 12 hr tablet Take 600 mg by mouth 2 (two) times daily as needed for to loosen phlegm.    . Insulin Isophane & Regular Human  (HUMULIN 70/30 MIX) (70-30) 100 UNIT/ML PEN Inject 16-20 Units into the skin daily before breakfast AND 16-20 Units daily before supper. (Patient taking differently: Inject 16-20 Units into the skin See admin instructions. Inject 16-20 Units into the skin daily before breakfast AND 16-20 Units daily before supper) 15 mL   . Insulin Pen Needle (PEN NEEDLES 31GX5/16") 31G X 8 MM MISC Use new needle with each insulin injection, twice a day. Dx E11.65, Z79.4 100 each 5  . Lancets (FREESTYLE) lancets Use as instructed 100 each 0  . Lancets 30G MISC     . Multiple Vitamins-Minerals (CENTRUM SILVER 50+MEN) TABS Take 1 tablet by mouth daily with breakfast.    . mupirocin ointment (BACTROBAN) 2 % Apply to left foot once daily 22 g 0  . Polyethyl Glycol-Propyl Glycol (SYSTANE OP) Place 1 drop into both eyes as needed (for dryness).     . torsemide (DEMADEX) 20 MG tablet Take 2 tablets (40 mg total) by mouth every other day. 180 tablet 3   No current facility-administered medications on file prior to visit.    Allergies  Allergen Reactions  . Ace Inhibitors Itching and Cough  . Naproxen Anaphylaxis, Shortness Of Breath and Other (See Comments)    Throat swells. cannot breathe, and causes GI distress    Recent Results (from the past 2160 hour(s))  Comprehensive metabolic panel     Status: Abnormal   Collection Time: 04/19/19 10:13 AM  Result Value Ref Range   Glucose 139 (H) 65 - 99 mg/dL   BUN 31 (H) 8 - 27 mg/dL   Creatinine, Ser 1.69 (H) 0.76 - 1.27 mg/dL   GFR calc non Af Amer 41 (L) >59 mL/min/1.73   GFR calc Af Amer 47 (L) >59 mL/min/1.73   BUN/Creatinine Ratio 18 10 - 24   Sodium 141 134 - 144 mmol/L   Potassium 4.3 3.5 - 5.2 mmol/L   Chloride 104 96 - 106 mmol/L   CO2 24 20 - 29 mmol/L   Calcium 9.5 8.6 - 10.2 mg/dL   Total Protein 6.5 6.0 - 8.5 g/dL   Albumin 3.6 (L) 3.8 - 4.8 g/dL   Globulin, Total 2.9 1.5 - 4.5 g/dL   Albumin/Globulin Ratio 1.2 1.2 - 2.2   Bilirubin Total 0.5 0.0  - 1.2 mg/dL   Alkaline Phosphatase 126 (H) 39 - 117 IU/L   AST 24 0 - 40 IU/L   ALT 25 0 - 44 IU/L  Lipid panel     Status: None   Collection Time: 04/19/19 10:13 AM  Result Value Ref Range   Cholesterol, Total 158 100 - 199 mg/dL   Triglycerides 96 0 - 149 mg/dL   HDL 53 >39 mg/dL   VLDL Cholesterol Cal 18 5 - 40 mg/dL   LDL Chol Calc (NIH) 87 0 - 99 mg/dL   Chol/HDL Ratio 3.0 0.0 - 5.0 ratio    Comment:  T. Chol/HDL Ratio                                             Men  Women                               1/2 Avg.Risk  3.4    3.3                                   Avg.Risk  5.0    4.4                                2X Avg.Risk  9.6    7.1                                3X Avg.Risk 23.4   11.0   POCT glycosylated hemoglobin (Hb A1C)     Status: Abnormal   Collection Time: 04/19/19 10:14 AM  Result Value Ref Range   Hemoglobin A1C 11.7 (A) 4.0 - 5.6 %   HbA1c POC (<> result, manual entry)     HbA1c, POC (prediabetic range)     HbA1c, POC (controlled diabetic range)    WOUND CULTURE     Status: Abnormal   Collection Time: 04/30/19 11:57 AM   Specimen: Wound  Result Value Ref Range   MICRO NUMBER: [id number]    SPECIMEN QUALITY: Adequate    SOURCE: WOUND (SITE NOT SPECIFIED)    STATUS: ADDENDUM - FINAL    GRAM STAIN: Gram positive cocci in chains     Comment: No white blood cells seen No epithelial cells seen Moderate Gram positive cocci in chains Few Gram positive bacilli   ISOLATE 1: Streptococcus agalactiae (A)     Comment: Heavy growth of Group B Streptococcus isolated      Susceptibility   Streptococcus agalactiae - AEROBIC CULT, GRAM STAIN POSITIVE 1    AMPICILLIN <=0.25 Sensitive     CEFOTAXIME <=0.12 Sensitive     CEFTRIAXONE <=0.12 Sensitive     VANCOMYCIN 0.5 Sensitive     CLINDAMYCIN <=0.25 Sensitive     LEVOFLOXACIN 1 Sensitive     PENO - penicillin* 0.12 Sensitive      * Legend:S = Susceptible  I = IntermediateR = Resistant  NS  = Not susceptible* = Not tested  NR = Not reported**NN = See antimicrobic comments  CBC with Differential     Status: Abnormal   Collection Time: 05/05/19  9:30 PM  Result Value Ref Range   WBC 3.6 (L) 4.0 - 10.5 K/uL   RBC 5.14 4.22 - 5.81 MIL/uL   Hemoglobin 10.9 (L) 13.0 - 17.0 g/dL   HCT 34.8 (L) 39.0 - 52.0 %   MCV 67.7 (L) 80.0 - 100.0 fL   MCH 21.2 (L) 26.0 - 34.0 pg   MCHC 31.3 30.0 - 36.0 g/dL   RDW 17.7 (H) 11.5 - 15.5 %   Platelets 202 150 - 400 K/uL   nRBC 0.0 0.0 - 0.2 %   Neutrophils Relative % 52 %   Neutro Abs 1.9 1.7 - 7.7 K/uL   Lymphocytes Relative  29 %   Lymphs Abs 1.1 0.7 - 4.0 K/uL   Monocytes Relative 14 %   Monocytes Absolute 0.5 0.1 - 1.0 K/uL   Eosinophils Relative 4 %   Eosinophils Absolute 0.2 0.0 - 0.5 K/uL   Basophils Relative 1 %   Basophils Absolute 0.0 0.0 - 0.1 K/uL   Immature Granulocytes 0 %   Abs Immature Granulocytes 0.01 0.00 - 0.07 K/uL    Comment: Performed at [facility], [place] [address], [place], [place] [address]  Comprehensive metabolic panel     Status: Abnormal   Collection Time: 05/05/19  9:30 PM  Result Value Ref Range   Sodium 133 (L) 135 - 145 mmol/L   Potassium 4.2 3.5 - 5.1 mmol/L   Chloride 102 98 - 111 mmol/L   CO2 23 22 - 32 mmol/L   Glucose, Bld 127 (H) 70 - 99 mg/dL   BUN 23 8 - 23 mg/dL   Creatinine, Ser 1.69 (H) 0.61 - 1.24 mg/dL   Calcium 8.8 (L) 8.9 - 10.3 mg/dL   Total Protein 6.9 6.5 - 8.1 g/dL   Albumin 3.4 (L) 3.5 - 5.0 g/dL   AST 33 15 - 41 U/L   ALT 28 0 - 44 U/L   Alkaline Phosphatase 77 38 - 126 U/L   Total Bilirubin 1.1 0.3 - 1.2 mg/dL   GFR calc non Af Amer 41 (L) >60 mL/min   GFR calc Af Amer 47 (L) >60 mL/min   Anion gap 8 5 - 15    Comment: Performed at [facility] [address], [place], [place] [address]  Magnesium     Status: None   Collection Time: 05/05/19  9:30 PM  Result Value Ref Range   Magnesium 1.9 1.7 - 2.4 mg/dL    Comment: Performed at [facility],  [place] [address] [place], [place] [address]  Lipase, blood     Status: None   Collection Time: 05/05/19  9:30 PM  Result Value Ref Range   Lipase 19 11 - 51 U/L    Comment: Performed at [facility] [address], [place], [place] [address]  SARS CORONAVIRUS 2 (TAT 6-24 HRS) Nasopharyngeal Nasopharyngeal Swab     Status: None   Collection Time: 05/05/19 10:47 PM   Specimen: Nasopharyngeal Swab  Result Value Ref Range   SARS Coronavirus 2 NEGATIVE NEGATIVE    Comment: (NOTE) SARS-CoV-2 target nucleic acids are NOT DETECTED. The SARS-CoV-2 RNA is generally detectable in upper and lower respiratory specimens during the acute phase of infection. Negative results do not preclude SARS-CoV-2 infection, do not rule out co-infections with other pathogens, and should not be used as the sole basis for treatment or other patient management decisions. Negative results must be combined with clinical observations, patient history, and epidemiological information. The expected result is Negative. Fact Sheet for Patients: SugarRoll.be Fact Sheet for Healthcare Providers: https://www.woods-mathews.com/ This test is not yet approved or cleared by the [place] FDA and  has been authorized for detection and/or diagnosis of SARS-CoV-2 by FDA under an Emergency Use Authorization (EUA). This EUA will remain  in effect (meaning this test can be used) for the duration of the COVID-19 declaration under Section 56 4(b)(1) of the Act, 21 U.S.C. section 360bbb-3(b)(1), unless the authorization is terminated or revoked sooner. Performed at [facility], [place] [address] [place], [place] [address]   Troponin I (High Sensitivity)     Status: None  Collection Time: 05/06/19  2:20 AM  Result Value Ref Range   Troponin I (High Sensitivity) 9 <18 ng/L    Comment: (NOTE) Elevated high sensitivity troponin I (hsTnI) values and significant  changes across serial  measurements may suggest ACS but many other  chronic and acute conditions are known to elevate hsTnI results.  Refer to the "Links" section for chest pain algorithms and additional  guidance. Performed at [facility], [place] [address], [place], [place] [address]   Cortisol-am, blood     Status: Abnormal   Collection Time: 05/06/19  2:20 AM  Result Value Ref Range   Cortisol - AM 25.6 (H) 6.7 - 22.6 ug/dL    Comment: Performed at [facility] [address] [place], [place] [address]  Culture, blood (Routine X 2) w Reflex to ID Panel     Status: None   Collection Time: 05/06/19  5:00 AM   Specimen: BLOOD LEFT ARM  Result Value Ref Range   Specimen Description BLOOD LEFT ARM    Special Requests      BOTTLES DRAWN AEROBIC AND ANAEROBIC Blood Culture results may not be optimal due to an excessive volume of blood received in culture bottles   Culture      NO GROWTH 5 DAYS Performed at [facility], [place] [address], [place], [place] [address]    Report Status 05/11/2019 FINAL   Cortisol-am, blood     Status: Abnormal   Collection Time: 05/06/19  5:20 AM  Result Value Ref Range   Cortisol - AM 49.1 (H) 6.7 - 22.6 ug/dL    Comment: Performed at [facility], [place] [address] [name], [place] [address]  Troponin I (High Sensitivity)     Status: None   Collection Time: 05/06/19  5:20 AM  Result Value Ref Range   Troponin I (High Sensitivity) 12 <18 ng/L    Comment: (NOTE) Elevated high sensitivity troponin I (hsTnI) values and significant  changes across serial measurements may suggest ACS but many other  chronic and acute conditions are known to elevate hsTnI results.  Refer to the "Links" section for chest pain algorithms and additional  guidance. Performed at [facility], [place] [address] [place], [place] [address]   Brain natriuretic peptide     Status: Abnormal   Collection Time: 05/06/19  5:20 AM  Result Value Ref Range   B Natriuretic Peptide  133.6 (H) 0.0 - 100.0 pg/mL    Comment: Performed at [facility] [address], [place], [place] [address]  Hemoglobin A1c     Status: Abnormal   Collection Time: 05/06/19  5:20 AM  Result Value Ref Range   Hgb A1c MFr Bld 10.3 (H) 4.8 - 5.6 %    Comment: (NOTE) Pre diabetes:          5.7%-6.4% Diabetes:              >6.4% Glycemic control for   <7.0% adults with diabetes    Mean Plasma Glucose 248.91 mg/dL    Comment: Performed at [facility] [address] [place], [place] [address]  Lipid panel     Status: None   Collection Time: 05/06/19  5:20 AM  Result Value Ref Range   Cholesterol 136 0 - 200 mg/dL   Triglycerides 47 <150 mg/dL   HDL 53 >40 mg/dL   Total CHOL/HDL Ratio 2.6 RATIO   VLDL 9 0 - 40 mg/dL  LDL Cholesterol 74 0 - 99 mg/dL    Comment:        Total Cholesterol/HDL:CHD Risk Coronary Heart Disease Risk Table                     Men   Women  1/2 Average Risk   3.4   3.3  Average Risk       5.0   4.4  2 X Average Risk   9.6   7.1  3 X Average Risk  23.4   11.0        Use the calculated Patient Ratio above and the CHD Risk Table to determine the patient's CHD Risk.        ATP III CLASSIFICATION (LDL):  <100     mg/dL   Optimal  100-129  mg/dL   Near or Above                    Optimal  130-159  mg/dL   Borderline  160-189  mg/dL   High  >190     mg/dL   Very High Performed at [facility] [address], [place], [place] [address]   TSH     Status: None   Collection Time: 05/06/19  5:20 AM  Result Value Ref Range   TSH 1.370 0.350 - 4.500 uIU/mL    Comment: Performed by a 3rd Generation assay with a functional sensitivity of <=0.01 uIU/mL. Performed at [facility], [place] [address], [place], [place] [address]   Basic metabolic panel     Status: Abnormal   Collection Time: 05/06/19  5:20 AM  Result Value Ref Range   Sodium 136 135 - 145 mmol/L   Potassium 4.8 3.5 - 5.1 mmol/L   Chloride 103 98 - 111 mmol/L   CO2 24 22  - 32 mmol/L   Glucose, Bld 143 (H) 70 - 99 mg/dL   BUN 20 8 - 23 mg/dL   Creatinine, Ser 1.63 (H) 0.61 - 1.24 mg/dL   Calcium 8.8 (L) 8.9 - 10.3 mg/dL   GFR calc non Af Amer 42 (L) >60 mL/min   GFR calc Af Amer 49 (L) >60 mL/min   Anion gap 9 5 - 15    Comment: Performed at [facility] [address], [place], [place] [address]  CBC     Status: Abnormal   Collection Time: 05/06/19  5:20 AM  Result Value Ref Range   WBC 4.7 4.0 - 10.5 K/uL   RBC 5.20 4.22 - 5.81 MIL/uL   Hemoglobin 10.5 (L) 13.0 - 17.0 g/dL   HCT 35.2 (L) 39.0 - 52.0 %   MCV 67.7 (L) 80.0 - 100.0 fL   MCH 20.2 (L) 26.0 - 34.0 pg   MCHC 29.8 (L) 30.0 - 36.0 g/dL   RDW 17.3 (H) 11.5 - 15.5 %   Platelets 210 150 - 400 K/uL    Comment: REPEATED TO VERIFY   nRBC 0.0 0.0 - 0.2 %    Comment: Performed at [facility], [place] [address] [place], [place] [address]  Culture, blood (Routine X 2) w Reflex to ID Panel     Status: None   Collection Time: 05/06/19  5:20 AM   Specimen: BLOOD LEFT HAND  Result Value Ref Range   Specimen Description BLOOD LEFT HAND    Special Requests      BOTTLES DRAWN AEROBIC ONLY Blood Culture results may not be  optimal due to an excessive volume of blood received in culture bottles   Culture      NO GROWTH 5 DAYS Performed at [facility] [address] [place], [place] [address]    Report Status 05/11/2019 FINAL   Protime-INR     Status: None   Collection Time: 05/06/19  7:48 AM  Result Value Ref Range   Prothrombin Time 14.7 11.4 - 15.2 seconds   INR 1.2 0.8 - 1.2    Comment: (NOTE) INR goal varies based on device and disease states. Performed at [facility], [place] [address] [place], [place] [address]   APTT     Status: None   Collection Time: 05/06/19  7:48 AM  Result Value Ref Range   aPTT 30 24 - 36 seconds    Comment: Performed at [facility] [address] [place], [place] [address]  Type and screen [place]      Status: None   Collection Time: 05/06/19  7:48 AM  Result Value Ref Range   ABO/RH(D) O POS    Antibody Screen NEG    Sample Expiration      05/09/2019,2359 Performed at [facility], [place] [address] [place], [place] [address]   ABO/Rh     Status: None   Collection Time: 05/06/19  7:48 AM  Result Value Ref Range   ABO/RH(D)      O POS Performed at [facility] [address], [place], [place] [address]   CBG monitoring, ED     Status: Abnormal   Collection Time: 05/06/19  9:30 AM  Result Value Ref Range   Glucose-Capillary 156 (H) 70 - 99 mg/dL  ECHOCARDIOGRAM COMPLETE     Status: None   Collection Time: 05/06/19 10:40 AM  Result Value Ref Range   Weight 2,821.89 oz   Height 67 in   BP 180/79 mmHg  CBG monitoring, ED     Status: None   Collection Time: 05/06/19  1:08 PM  Result Value Ref Range   Glucose-Capillary 91 70 - 99 mg/dL  Glucose, capillary     Status: Abnormal   Collection Time: 05/06/19  6:56 PM  Result Value Ref Range   Glucose-Capillary 63 (L) 70 - 99 mg/dL  Glucose, capillary     Status: Abnormal   Collection Time: 05/06/19  8:04 PM  Result Value Ref Range   Glucose-Capillary 68 (L) 70 - 99 mg/dL  Glucose, capillary     Status: Abnormal   Collection Time: 05/06/19  9:10 PM  Result Value Ref Range   Glucose-Capillary 117 (H) 70 - 99 mg/dL  Glucose, capillary     Status: None   Collection Time: 05/07/19 12:09 AM  Result Value Ref Range   Glucose-Capillary 91 70 - 99 mg/dL  Comprehensive metabolic panel     Status: Abnormal   Collection Time: 05/07/19  3:16 AM  Result Value Ref Range   Sodium 141 135 - 145 mmol/L   Potassium 4.1 3.5 - 5.1 mmol/L   Chloride 110 98 - 111 mmol/L   CO2 21 (L) 22 - 32 mmol/L   Glucose, Bld 86 70 - 99 mg/dL   BUN 21 8 - 23 mg/dL   Creatinine, Ser 1.82 (H) 0.61 - 1.24 mg/dL   Calcium 8.9 8.9 - 10.3 mg/dL   Total Protein 6.3 (L) 6.5 - 8.1 g/dL   Albumin 3.2 (L) 3.5 - 5.0 g/dL   AST 30  15 - 41 U/L   ALT 22 0  - 44 U/L   Alkaline Phosphatase 59 38 - 126 U/L   Total Bilirubin 1.2 0.3 - 1.2 mg/dL   GFR calc non Af Amer 37 (L) >60 mL/min   GFR calc Af Amer 43 (L) >60 mL/min   Anion gap 10 5 - 15    Comment: Performed at [facility] [address], [place], [place] [address]  CBC     Status: Abnormal   Collection Time: 05/07/19  3:16 AM  Result Value Ref Range   WBC 4.7 4.0 - 10.5 K/uL   RBC 5.12 4.22 - 5.81 MIL/uL   Hemoglobin 10.5 (L) 13.0 - 17.0 g/dL   HCT 34.8 (L) 39.0 - 52.0 %   MCV 68.0 (L) 80.0 - 100.0 fL   MCH 20.5 (L) 26.0 - 34.0 pg   MCHC 30.2 30.0 - 36.0 g/dL   RDW 17.4 (H) 11.5 - 15.5 %   Platelets 184 150 - 400 K/uL    Comment: REPEATED TO VERIFY   nRBC 0.0 0.0 - 0.2 %    Comment: Performed at [facility], [place] [address] [place], [place] [address]  Glucose, capillary     Status: None   Collection Time: 05/07/19  4:36 AM  Result Value Ref Range   Glucose-Capillary 87 70 - 99 mg/dL  Glucose, capillary     Status: Abnormal   Collection Time: 05/07/19  8:34 AM  Result Value Ref Range   Glucose-Capillary 107 (H) 70 - 99 mg/dL  Cortisol     Status: None   Collection Time: 05/07/19  9:07 AM  Result Value Ref Range   Cortisol, Plasma 17.7 ug/dL    Comment: (NOTE) AM    6.7 - 22.6 ug/dL PM   <10.0       ug/dL Performed at [facility] [address] [place], [place] [address]   Glucose, capillary     Status: Abnormal   Collection Time: 05/07/19 12:41 PM  Result Value Ref Range   Glucose-Capillary 139 (H) 70 - 99 mg/dL  Glucose, capillary     Status: Abnormal   Collection Time: 05/07/19  4:39 PM  Result Value Ref Range   Glucose-Capillary 148 (H) 70 - 99 mg/dL  Glucose, capillary     Status: Abnormal   Collection Time: 05/07/19  8:06 PM  Result Value Ref Range   Glucose-Capillary 163 (H) 70 - 99 mg/dL  Glucose, capillary     Status: Abnormal   Collection Time: 05/08/19 12:29 AM  Result Value Ref Range   Glucose-Capillary 130 (H) 70 - 99 mg/dL   Comprehensive metabolic panel     Status: Abnormal   Collection Time: 05/08/19  3:44 AM  Result Value Ref Range   Sodium 138 135 - 145 mmol/L   Potassium 3.7 3.5 - 5.1 mmol/L   Chloride 109 98 - 111 mmol/L   CO2 25 22 - 32 mmol/L   Glucose, Bld 119 (H) 70 - 99 mg/dL   BUN 26 (H) 8 - 23 mg/dL   Creatinine, Ser 2.01 (H) 0.61 - 1.24 mg/dL   Calcium 8.5 (L) 8.9 - 10.3 mg/dL   Total Protein 5.4 (L) 6.5 - 8.1 g/dL   Albumin 2.7 (L) 3.5 - 5.0 g/dL   AST 22 15 - 41 U/L   ALT 20 0 - 44 U/L   Alkaline Phosphatase 49 38 - 126 U/L   Total Bilirubin 1.1 0.3 -  1.2 mg/dL   GFR calc non Af Amer 33 (L) >60 mL/min   GFR calc Af Amer 38 (L) >60 mL/min   Anion gap 4 (L) 5 - 15    Comment: Performed at [facility] [address] [place], [place] [address]  CBC     Status: Abnormal   Collection Time: 05/08/19  3:44 AM  Result Value Ref Range   WBC 5.2 4.0 - 10.5 K/uL   RBC 4.27 4.22 - 5.81 MIL/uL   Hemoglobin 8.8 (L) 13.0 - 17.0 g/dL    Comment: Reticulocyte Hemoglobin testing may be clinically indicated, consider ordering this additional test [id number]    HCT 28.5 (L) 39.0 - 52.0 %   MCV 66.7 (L) 80.0 - 100.0 fL   MCH 20.6 (L) 26.0 - 34.0 pg   MCHC 30.9 30.0 - 36.0 g/dL   RDW 17.4 (H) 11.5 - 15.5 %   Platelets 166 150 - 400 K/uL   nRBC 0.0 0.0 - 0.2 %    Comment: Performed at [facility], [place] [address], [place], [place] [address]  Glucose, capillary     Status: Abnormal   Collection Time: 05/08/19  4:33 AM  Result Value Ref Range   Glucose-Capillary 116 (H) 70 - 99 mg/dL  Glucose, capillary     Status: Abnormal   Collection Time: 05/08/19  7:54 AM  Result Value Ref Range   Glucose-Capillary 108 (H) 70 - 99 mg/dL  Glucose, capillary     Status: Abnormal   Collection Time: 05/08/19 11:57 AM  Result Value Ref Range   Glucose-Capillary 111 (H) 70 - 99 mg/dL  Glucose, capillary     Status: Abnormal   Collection Time: 05/08/19  5:12 PM  Result Value Ref Range    Glucose-Capillary 119 (H) 70 - 99 mg/dL  Glucose, capillary     Status: Abnormal   Collection Time: 05/08/19 10:02 PM  Result Value Ref Range   Glucose-Capillary 211 (H) 70 - 99 mg/dL   Comment 1 Notify RN    Comment 2 Document in Chart   Comprehensive metabolic panel     Status: Abnormal   Collection Time: 05/09/19  3:00 AM  Result Value Ref Range   Sodium 137 135 - 145 mmol/L   Potassium 3.9 3.5 - 5.1 mmol/L   Chloride 107 98 - 111 mmol/L   CO2 24 22 - 32 mmol/L   Glucose, Bld 233 (H) 70 - 99 mg/dL   BUN 30 (H) 8 - 23 mg/dL   Creatinine, Ser 2.16 (H) 0.61 - 1.24 mg/dL   Calcium 8.3 (L) 8.9 - 10.3 mg/dL   Total Protein 5.4 (L) 6.5 - 8.1 g/dL   Albumin 2.6 (L) 3.5 - 5.0 g/dL   AST 24 15 - 41 U/L   ALT 21 0 - 44 U/L   Alkaline Phosphatase 57 38 - 126 U/L   Total Bilirubin 0.5 0.3 - 1.2 mg/dL   GFR calc non Af Amer 30 (L) >60 mL/min   GFR calc Af Amer 35 (L) >60 mL/min   Anion gap 6 5 - 15    Comment: Performed at [facility], [address] [place], [place] [address]  CBC     Status: Abnormal   Collection Time: 05/09/19  3:00 AM  Result Value Ref Range   WBC 3.2 (L) 4.0 - 10.5 K/uL   RBC 4.15 (L) 4.22 - 5.81 MIL/uL   Hemoglobin 8.5 (L) 13.0 - 17.0 g/dL  Comment: Reticulocyte Hemoglobin testing may be clinically indicated, consider ordering this additional test [id number]    HCT 28.1 (L) 39.0 - 52.0 %   MCV 67.7 (L) 80.0 - 100.0 fL   MCH 20.5 (L) 26.0 - 34.0 pg   MCHC 30.2 30.0 - 36.0 g/dL   RDW 17.2 (H) 11.5 - 15.5 %   Platelets 148 (L) 150 - 400 K/uL    Comment: REPEATED TO VERIFY   nRBC 0.0 0.0 - 0.2 %    Comment: Performed at [facility] [address] [place] [address] [place], [place] [address]  Glucose, capillary     Status: Abnormal   Collection Time: 05/09/19  7:51 AM  Result Value Ref Range   Glucose-Capillary 199 (H) 70 - 99 mg/dL  Glucose, capillary     Status: Abnormal   Collection Time: 05/09/19 11:49 AM  Result Value Ref Range   Glucose-Capillary 136  (H) 70 - 99 mg/dL  Glucose, capillary     Status: Abnormal   Collection Time: 05/09/19  4:22 PM  Result Value Ref Range   Glucose-Capillary 143 (H) 70 - 99 mg/dL  POCT glucose (manual entry)     Status: Abnormal   Collection Time: 05/17/19 10:13 AM  Result Value Ref Range   POC Glucose 56 (A) 70 - 99 mg/dl  POCT glucose (manual entry)     Status: Abnormal   Collection Time: 05/17/19 10:36 AM  Result Value Ref Range   POC Glucose 63 (A) 70 - 99 mg/dl  POCT glucose (manual entry)     Status: Abnormal   Collection Time: 05/17/19 11:10 AM  Result Value Ref Range   POC Glucose 61 (A) 70 - 99 mg/dl  Glucose, random     Status: Abnormal   Collection Time: 05/17/19 11:10 AM  Result Value Ref Range   Glucose 53 (L) 65 - 99 mg/dL  CBC     Status: Abnormal   Collection Time: 05/17/19 11:12 AM  Result Value Ref Range   WBC 3.6 3.4 - 10.8 x10E3/uL   RBC 5.04 4.14 - 5.80 x10E6/uL   Hemoglobin 9.9 (L) 13.0 - 17.7 g/dL   Hematocrit 33.4 (L) 37.5 - 51.0 %   MCV 66 (L) 79 - 97 fL   MCH 19.6 (L) 26.6 - 33.0 pg   MCHC 29.6 (L) 31.5 - 35.7 g/dL   RDW 18.7 (H) 11.6 - 15.4 %   Platelets 156 150 - 450 x10E3/uL  Comprehensive metabolic panel     Status: Abnormal   Collection Time: 05/17/19 11:12 AM  Result Value Ref Range   Glucose 50 (L) 65 - 99 mg/dL   BUN 14 8 - 27 mg/dL   Creatinine, Ser 1.66 (H) 0.76 - 1.27 mg/dL   GFR calc non Af Amer 41 (L) >59 mL/min/1.73   GFR calc Af Amer 48 (L) >59 mL/min/1.73   BUN/Creatinine Ratio 8 (L) 10 - 24   Sodium 142 134 - 144 mmol/L   Potassium 4.8 3.5 - 5.2 mmol/L   Chloride 106 96 - 106 mmol/L   CO2 24 20 - 29 mmol/L   Calcium 9.3 8.6 - 10.2 mg/dL   Total Protein 6.4 6.0 - 8.5 g/dL   Albumin 3.6 (L) 3.8 - 4.8 g/dL   Globulin, Total 2.8 1.5 - 4.5 g/dL   Albumin/Globulin Ratio 1.3 1.2 - 2.2   Bilirubin Total 0.8 0.0 - 1.2 mg/dL   Alkaline Phosphatase 84 39 - 117 IU/L   AST 30 0 - 40 IU/L  ALT 31 0 - 44 IU/L  POCT glucose (manual entry)     Status:  Abnormal   Collection Time: 05/17/19 11:43 AM  Result Value Ref Range   POC Glucose 65 (A) 70 - 99 mg/dl  CBC with Differential     Status: Abnormal   Collection Time: 05/17/19  1:04 PM  Result Value Ref Range   WBC 3.6 (L) 4.0 - 10.5 K/uL   RBC 4.99 4.22 - 5.81 MIL/uL   Hemoglobin 10.5 (L) 13.0 - 17.0 g/dL   HCT 34.0 (L) 39.0 - 52.0 %   MCV 68.1 (L) 80.0 - 100.0 fL   MCH 21.0 (L) 26.0 - 34.0 pg   MCHC 30.9 30.0 - 36.0 g/dL   RDW 17.9 (H) 11.5 - 15.5 %   Platelets 136 (L) 150 - 400 K/uL   nRBC 0.0 0.0 - 0.2 %   Neutrophils Relative % 62 %   Neutro Abs 2.3 1.7 - 7.7 K/uL   Lymphocytes Relative 21 %   Lymphs Abs 0.7 0.7 - 4.0 K/uL   Monocytes Relative 13 %   Monocytes Absolute 0.5 0.1 - 1.0 K/uL   Eosinophils Relative 3 %   Eosinophils Absolute 0.1 0.0 - 0.5 K/uL   Basophils Relative 1 %   Basophils Absolute 0.0 0.0 - 0.1 K/uL   Immature Granulocytes 0 %   Abs Immature Granulocytes 0.01 0.00 - 0.07 K/uL    Comment: Performed at [facility], [address] [place], [place] [address]  Basic metabolic panel     Status: Abnormal   Collection Time: 05/17/19  1:04 PM  Result Value Ref Range   Sodium 139 135 - 145 mmol/L   Potassium 4.1 3.5 - 5.1 mmol/L   Chloride 106 98 - 111 mmol/L   CO2 25 22 - 32 mmol/L   Glucose, Bld 78 70 - 99 mg/dL   BUN 12 8 - 23 mg/dL   Creatinine, Ser 1.55 (H) 0.61 - 1.24 mg/dL   Calcium 9.2 8.9 - 10.3 mg/dL   GFR calc non Af Amer 45 (L) >60 mL/min   GFR calc Af Amer 52 (L) >60 mL/min   Anion gap 8 5 - 15    Comment: Performed at [facility] [address] [place], [place] [address]  SARS Coronavirus 2 by RT PCR (hospital order, performed in [facility] hospital lab) Nasopharyngeal Nasopharyngeal Swab     Status: None   Collection Time: 05/30/19  6:44 AM   Specimen: Nasopharyngeal Swab  Result Value Ref Range   SARS Coronavirus 2 NEGATIVE NEGATIVE    Comment: (NOTE) If result is NEGATIVE SARS-CoV-2 target nucleic acids are NOT  DETECTED. The SARS-CoV-2 RNA is generally detectable in upper and lower  respiratory specimens during the acute phase of infection. The lowest  concentration of SARS-CoV-2 viral copies this assay can detect is 250  copies / mL. A negative result does not preclude SARS-CoV-2 infection  and should not be used as the sole basis for treatment or other  patient management decisions.  A negative result may occur with  improper specimen collection / handling, submission of specimen other  than nasopharyngeal swab, presence of viral mutation(s) within the  areas targeted by this assay, and inadequate number of viral copies  (<250 copies / mL). A negative result must be combined with clinical  observations, patient history, and epidemiological information. If result is POSITIVE SARS-CoV-2 target nucleic acids are DETECTED. The SARS-CoV-2 RNA is generally detectable in  upper and lower  respiratory specimens dur ing the acute phase of infection.  Positive  results are indicative of active infection with SARS-CoV-2.  Clinical  correlation with patient history and other diagnostic information is  necessary to determine patient infection status.  Positive results do  not rule out bacterial infection or co-infection with other viruses. If result is PRESUMPTIVE POSTIVE SARS-CoV-2 nucleic acids MAY BE PRESENT.   A presumptive positive result was obtained on the submitted specimen  and confirmed on repeat testing.  While 2019 novel coronavirus  (SARS-CoV-2) nucleic acids may be present in the submitted sample  additional confirmatory testing may be necessary for epidemiological  and / or clinical management purposes  to differentiate between  SARS-CoV-2 and other Sarbecovirus currently known to infect humans.  If clinically indicated additional testing with an alternate test  methodology [phone]) is advised. The SARS-CoV-2 RNA is generally  detectable in upper and lower respiratory sp ecimens during  the acute  phase of infection. The expected result is Negative. Fact Sheet for Patients:  StrictlyIdeas.no Fact Sheet for Healthcare Providers: BankingDealers.co.za This test is not yet approved or cleared by the [place] FDA and has been authorized for detection and/or diagnosis of SARS-CoV-2 by FDA under an Emergency Use Authorization (EUA).  This EUA will remain in effect (meaning this test can be used) for the duration of the COVID-19 declaration under Section 564(b)(1) of the Act, 21 U.S.C. section 360bbb-3(b)(1), unless the authorization is terminated or revoked sooner. Performed at [facility], [place] [address], [place], [place] [address]   I-STAT, [place] 8     Status: Abnormal   Collection Time: 05/30/19  6:56 AM  Result Value Ref Range   Sodium 140 135 - 145 mmol/L   Potassium 3.9 3.5 - 5.1 mmol/L   Chloride 101 98 - 111 mmol/L   BUN 19 8 - 23 mg/dL   Creatinine, Ser 1.70 (H) 0.61 - 1.24 mg/dL   Glucose, Bld 128 (H) 70 - 99 mg/dL   Calcium, Ion 1.22 1.15 - 1.40 mmol/L   TCO2 29 22 - 32 mmol/L   Hemoglobin 12.2 (L) 13.0 - 17.0 g/dL   HCT 36.0 (L) 39.0 - 52.0 %  Glucose, capillary     Status: Abnormal   Collection Time: 05/30/19 11:32 AM  Result Value Ref Range   Glucose-Capillary 123 (H) 70 - 99 mg/dL  Glucose, capillary     Status: Abnormal   Collection Time: 05/30/19  5:23 PM  Result Value Ref Range   Glucose-Capillary 119 (H) 70 - 99 mg/dL   Comment 1 Notify RN    Comment 2 Document in Chart   Glucose, capillary     Status: Abnormal   Collection Time: 05/30/19  9:00 PM  Result Value Ref Range   Glucose-Capillary 225 (H) 70 - 99 mg/dL  Glucose, capillary     Status: Abnormal   Collection Time: 05/31/19  5:58 AM  Result Value Ref Range   Glucose-Capillary 204 (H) 70 - 99 mg/dL  Glucose, capillary     Status: Abnormal   Collection Time: 05/31/19  7:53 AM  Result Value Ref Range   Glucose-Capillary 194 (H)  70 - 99 mg/dL  Glucose, capillary     Status: Abnormal   Collection Time: 05/31/19 11:45 AM  Result Value Ref Range   Glucose-Capillary 129 (H) 70 - 99 mg/dL  Basic metabolic panel     Status: Abnormal   Collection Time: 06/24/19 12:00 PM  Result Value  Ref Range   Glucose 252 (H) 65 - 99 mg/dL   BUN 23 8 - 27 mg/dL   Creatinine, Ser 2.06 (H) 0.76 - 1.27 mg/dL   GFR calc non Af Amer 32 (L) >59 mL/min/1.73   GFR calc Af Amer 37 (L) >59 mL/min/1.73   BUN/Creatinine Ratio 11 10 - 24   Sodium 139 134 - 144 mmol/L   Potassium 5.9 (HH) 3.5 - 5.2 mmol/L    Comment:                   Client Requested Flag   Chloride 99 96 - 106 mmol/L   CO2 23 20 - 29 mmol/L   Calcium 9.3 8.6 - 10.2 mg/dL  Brain natriuretic peptide     Status: Abnormal   Collection Time: 06/24/19 12:00 PM  Result Value Ref Range   BNP 273.1 (H) 0.0 - 100.0 pg/mL  Basic metabolic panel     Status: Abnormal   Collection Time: 06/25/19 10:38 AM  Result Value Ref Range   Sodium 134 (L) 135 - 145 mmol/L   Potassium 5.4 (H) 3.5 - 5.1 mmol/L   Chloride 100 98 - 111 mmol/L   CO2 24 22 - 32 mmol/L   Glucose, Bld 223 (H) 70 - 99 mg/dL   BUN 19 8 - 23 mg/dL   Creatinine, Ser 1.89 (H) 0.61 - 1.24 mg/dL   Calcium 8.7 (L) 8.9 - 10.3 mg/dL   GFR calc non Af Amer 35 (L) >60 mL/min   GFR calc Af Amer 41 (L) >60 mL/min   Anion gap 10 5 - 15    Comment: Performed at [facility], [address], [place], [place] [address]  Magnesium     Status: None   Collection Time: 06/25/19 10:38 AM  Result Value Ref Range   Magnesium 2.1 1.7 - 2.4 mg/dL    Comment: Performed at [facility] [address] [place], [place] [address]  Brain natriuretic peptide (order ONLY if patient c/o SOB)     Status: Abnormal   Collection Time: 06/25/19 10:38 AM  Result Value Ref Range   B Natriuretic Peptide 294.1 (H) 0.0 - 100.0 pg/mL    Comment: Performed at [facility] [address], [place], [place] [address]  CBC     Status:  Abnormal   Collection Time: 06/25/19 10:38 AM  Result Value Ref Range   WBC 4.7 4.0 - 10.5 K/uL   RBC 4.51 4.22 - 5.81 MIL/uL   Hemoglobin 9.5 (L) 13.0 - 17.0 g/dL   HCT 30.5 (L) 39.0 - 52.0 %   MCV 67.6 (L) 80.0 - 100.0 fL   MCH 21.1 (L) 26.0 - 34.0 pg   MCHC 31.1 30.0 - 36.0 g/dL   RDW 16.0 (H) 11.5 - 15.5 %   Platelets 229 150 - 400 K/uL    Comment: REPEATED TO VERIFY   nRBC 0.0 0.0 - 0.2 %    Comment: Performed at [facility], [place] [address], [place], [place] [address]  SARS CORONAVIRUS 2 (TAT 6-24 HRS) Nasopharyngeal Nasopharyngeal Swab     Status: None   Collection Time: 06/25/19  7:45 PM   Specimen: Nasopharyngeal Swab  Result Value Ref Range   SARS Coronavirus 2 NEGATIVE NEGATIVE    Comment: (NOTE) SARS-CoV-2 target nucleic acids are NOT DETECTED. The SARS-CoV-2 RNA is generally detectable in upper and lower respiratory specimens during the acute phase of infection. Negative results do not  preclude SARS-CoV-2 infection, do not rule out co-infections with other pathogens, and should not be used as the sole basis for treatment or other patient management decisions. Negative results must be combined with clinical observations, patient history, and epidemiological information. The expected result is Negative. Fact Sheet for Patients: SugarRoll.be Fact Sheet for Healthcare Providers: https://www.woods-mathews.com/ This test is not yet approved or cleared by the [place] FDA and  has been authorized for detection and/or diagnosis of SARS-CoV-2 by FDA under an Emergency Use Authorization (EUA). This EUA will remain  in effect (meaning this test can be used) for the duration of the COVID-19 declaration under Section 56 4(b)(1) of the Act, 21 U.S.C. section 360bbb-3(b)(1), unless the authorization is terminated or revoked sooner. Performed at [facility], [place] [address], [place], [place] [address]   CBG monitoring,  ED     Status: Abnormal   Collection Time: 06/25/19 11:03 PM  Result Value Ref Range   Glucose-Capillary 297 (H) 70 - 99 mg/dL  Glucose, capillary     Status: Abnormal   Collection Time: 06/26/19  2:56 AM  Result Value Ref Range   Glucose-Capillary 336 (H) 70 - 99 mg/dL   Comment 1 Notify RN    Comment 2 Document in Chart   Glucose, capillary     Status: Abnormal   Collection Time: 06/26/19  7:45 AM  Result Value Ref Range   Glucose-Capillary 169 (H) 70 - 99 mg/dL  Glucose, capillary     Status: Abnormal   Collection Time: 06/26/19 12:47 PM  Result Value Ref Range   Glucose-Capillary 180 (H) 70 - 99 mg/dL   Comment 1 Notify RN    Comment 2 Document in Chart   Basic metabolic panel     Status: Abnormal   Collection Time: 06/26/19  2:22 PM  Result Value Ref Range   Sodium 138 135 - 145 mmol/L   Potassium 3.8 3.5 - 5.1 mmol/L   Chloride 103 98 - 111 mmol/L   CO2 27 22 - 32 mmol/L   Glucose, Bld 208 (H) 70 - 99 mg/dL   BUN 17 8 - 23 mg/dL   Creatinine, Ser 1.89 (H) 0.61 - 1.24 mg/dL   Calcium 8.6 (L) 8.9 - 10.3 mg/dL   GFR calc non Af Amer 35 (L) >60 mL/min   GFR calc Af Amer 41 (L) >60 mL/min   Anion gap 8 5 - 15    Comment: Performed at [facility], [place] [address], [place], [place] [address]  CBC     Status: Abnormal   Collection Time: 06/26/19  2:22 PM  Result Value Ref Range   WBC 2.9 (L) 4.0 - 10.5 K/uL   RBC 4.32 4.22 - 5.81 MIL/uL   Hemoglobin 8.8 (L) 13.0 - 17.0 g/dL    Comment: Reticulocyte Hemoglobin testing may be clinically indicated, consider ordering this additional test [id number]    HCT 28.7 (L) 39.0 - 52.0 %   MCV 66.4 (L) 80.0 - 100.0 fL   MCH 20.4 (L) 26.0 - 34.0 pg   MCHC 30.7 30.0 - 36.0 g/dL   RDW 15.8 (H) 11.5 - 15.5 %   Platelets 224 150 - 400 K/uL    Comment: REPEATED TO VERIFY   nRBC 0.0 0.0 - 0.2 %    Comment: Performed at [facility], [place] [address], [place], [place] [address]  Glucose, capillary     Status: Abnormal    Collection Time: 06/26/19  5:20 PM  Result Value Ref Range   Glucose-Capillary 224 (H) 70 - 99 mg/dL   Comment 1 Notify RN    Comment 2 Document in Chart   Glucose, capillary     Status: Abnormal   Collection Time: 06/26/19  9:17 PM  Result Value Ref Range   Glucose-Capillary 167 (H) 70 - 99 mg/dL  Basic metabolic panel     Status: Abnormal   Collection Time: 06/27/19  4:38 AM  Result Value Ref Range   Sodium 140 135 - 145 mmol/L   Potassium 3.6 3.5 - 5.1 mmol/L   Chloride 104 98 - 111 mmol/L   CO2 26 22 - 32 mmol/L   Glucose, Bld 131 (H) 70 - 99 mg/dL   BUN 17 8 - 23 mg/dL   Creatinine, Ser 1.92 (H) 0.61 - 1.24 mg/dL   Calcium 8.5 (L) 8.9 - 10.3 mg/dL   GFR calc non Af Amer 35 (L) >60 mL/min   GFR calc Af Amer 40 (L) >60 mL/min   Anion gap 10 5 - 15    Comment: Performed at [facility], [place] [address], [place], [place] [address]  CBC     Status: Abnormal   Collection Time: 06/27/19  4:38 AM  Result Value Ref Range   WBC 5.4 4.0 - 10.5 K/uL   RBC 4.17 (L) 4.22 - 5.81 MIL/uL   Hemoglobin 8.5 (L) 13.0 - 17.0 g/dL    Comment: Reticulocyte Hemoglobin testing may be clinically indicated, consider ordering this additional test [id number]    HCT 27.5 (L) 39.0 - 52.0 %   MCV 65.9 (L) 80.0 - 100.0 fL   MCH 20.4 (L) 26.0 - 34.0 pg   MCHC 30.9 30.0 - 36.0 g/dL   RDW 15.8 (H) 11.5 - 15.5 %   Platelets 233 150 - 400 K/uL    Comment: REPEATED TO VERIFY   nRBC 0.0 0.0 - 0.2 %    Comment: Performed at [facility], [place] [address], [place], [place] [address]  Glucose, capillary     Status: Abnormal   Collection Time: 06/27/19  5:49 AM  Result Value Ref Range   Glucose-Capillary 119 (H) 70 - 99 mg/dL  Glucose, capillary     Status: Abnormal   Collection Time: 06/27/19  8:32 AM  Result Value Ref Range   Glucose-Capillary 143 (H) 70 - 99 mg/dL  Glucose, capillary     Status: Abnormal   Collection Time: 06/27/19 11:46 AM  Result Value Ref Range   Glucose-Capillary 134 (H)  70 - 99 mg/dL  Glucose, capillary     Status: Abnormal   Collection Time: 06/27/19  4:29 PM  Result Value Ref Range   Glucose-Capillary 119 (H) 70 - 99 mg/dL  Glucose, capillary     Status: Abnormal   Collection Time: 06/27/19  9:11 PM  Result Value Ref Range   Glucose-Capillary 179 (H) 70 - 99 mg/dL   Comment 1 Notify RN    Comment 2 Document in Chart   Basic metabolic panel     Status: Abnormal   Collection Time: 06/28/19  5:33 AM  Result Value Ref Range   Sodium 139 135 - 145 mmol/L   Potassium 4.0 3.5 - 5.1 mmol/L   Chloride 100 98 - 111 mmol/L   CO2 27 22 - 32 mmol/L   Glucose, Bld 121 (H) 70 - 99 mg/dL   BUN 21 8 - 23 mg/dL   Creatinine, Ser 1.85 (H) 0.61 - 1.24 mg/dL  Calcium 8.9 8.9 - 10.3 mg/dL   GFR calc non Af Amer 36 (L) >60 mL/min   GFR calc Af Amer 42 (L) >60 mL/min   Anion gap 12 5 - 15    Comment: Performed at [facility] [address] [place], [place] [address]  Glucose, capillary     Status: Abnormal   Collection Time: 06/28/19  6:10 AM  Result Value Ref Range   Glucose-Capillary 114 (H) 70 - 99 mg/dL   Comment 1 Notify RN    Comment 2 Document in Chart   Brain natriuretic peptide     Status: Abnormal   Collection Time: 06/28/19  7:48 AM  Result Value Ref Range   B Natriuretic Peptide 306.1 (H) 0.0 - 100.0 pg/mL    Comment: Performed at [facility] [address], [place], [place] [address]  Glucose, capillary     Status: Abnormal   Collection Time: 06/28/19 12:23 PM  Result Value Ref Range   Glucose-Capillary 157 (H) 70 - 99 mg/dL  Glucose, capillary     Status: Abnormal   Collection Time: 06/28/19  3:31 PM  Result Value Ref Range   Glucose-Capillary 54 (L) 70 - 99 mg/dL  Glucose, capillary     Status: None   Collection Time: 06/28/19  3:55 PM  Result Value Ref Range   Glucose-Capillary 89 70 - 99 mg/dL  Occult blood card to lab, stool     Status: None   Collection Time: 06/28/19  6:48 PM  Result Value Ref Range   Fecal Occult  Bld NEGATIVE NEGATIVE    Comment: Performed at [facility], [place] [address] [place], [place] [address]  Glucose, capillary     Status: Abnormal   Collection Time: 06/28/19  9:09 PM  Result Value Ref Range   Glucose-Capillary 328 (H) 70 - 99 mg/dL  Basic metabolic panel     Status: Abnormal   Collection Time: 06/29/19  5:44 AM  Result Value Ref Range   Sodium 137 135 - 145 mmol/L   Potassium 4.0 3.5 - 5.1 mmol/L   Chloride 95 (L) 98 - 111 mmol/L   CO2 30 22 - 32 mmol/L   Glucose, Bld 218 (H) 70 - 99 mg/dL   BUN 25 (H) 8 - 23 mg/dL   Creatinine, Ser 2.06 (H) 0.61 - 1.24 mg/dL   Calcium 8.8 (L) 8.9 - 10.3 mg/dL   GFR calc non Af Amer 32 (L) >60 mL/min   GFR calc Af Amer 37 (L) >60 mL/min   Anion gap 12 5 - 15    Comment: Performed at [facility] [address] [place], [place] [address]  Glucose, capillary     Status: Abnormal   Collection Time: 06/29/19  6:50 AM  Result Value Ref Range   Glucose-Capillary 194 (H) 70 - 99 mg/dL  Glucose, capillary     Status: Abnormal   Collection Time: 06/29/19  7:32 AM  Result Value Ref Range   Glucose-Capillary 214 (H) 70 - 99 mg/dL  Glucose, capillary     Status: Abnormal   Collection Time: 06/29/19 12:05 PM  Result Value Ref Range   Glucose-Capillary 107 (H) 70 - 99 mg/dL    Objective: There were no vitals filed for this visit.  General: Patient is awake, alert, oriented x 3 and in no acute distress.  Dermatology: Skin is warm and dry bilateral with a new blister with bogginess and drainage at plantar left lateral  midfoot with mild reactive keratosis. There is malodor, bloody active drainage, no erythema, no edema. No other acute signs of infection.   Vascular: Dorsalis Pedis pulse = 1/4 Bilateral,  Posterior Tibial pulse = 1/4 Bilateral,  Capillary Fill Time < 5 seconds  Neurologic: Protective sensation diminished bilateral.  Musculosketal: There is Charcot deformity noted on left foot.  Mild pain to previous  ulceration area.   dNo results for input(s): GRAMSTAIN, LABORGA in the last 8760 hours.  Assessment and Plan:  Problem List Items Addressed This Visit      Endocrine   Diabetic foot ulcer ([place]) - Primary   Relevant Orders   WOUND CULTURE   Charcot foot due to diabetes mellitus ([place])    Other Visit Diagnoses    PAD (peripheral artery disease) ([place])         -Examined patient and discussed the progression of the wound and treatment alternatives. -Blood blister debrided using a tissue nipper and wound culture obtained -Rx Bactrim -Applied betadine and iosorb and patient to do the same at least every other day -Continue with postop shoe with Pegasys Plastizote offloading insole, as dispensed today  -Continue with vascular follow-up patient is status post angiogram -Awaiting CROW boot we will wait until healed - Advised patient to go to the ER or return to office if the wound worsens or if constitutional symptoms are present. -Patient to return to the office in 2 weeks for wound care Landis Martins, DPM

## 2019-07-09 NOTE — Telephone Encounter (Signed)
Attempted to call patient regarding upcoming appointment, no answer, unable to leave a message.  [name] [name] ? ?

## 2019-07-13 LAB — WOUND CULTURE
MICRO NUMBER:: 1134682
SPECIMEN QUALITY:: ADEQUATE

## 2019-07-15 ENCOUNTER — Ambulatory Visit (HOSPITAL_COMMUNITY): Payer: Medicare Other | Attending: Cardiology

## 2019-07-15 ENCOUNTER — Telehealth: Payer: Self-pay

## 2019-07-15 ENCOUNTER — Other Ambulatory Visit: Payer: Self-pay

## 2019-07-15 VITALS — Ht 66.0 in | Wt 166.0 lb

## 2019-07-15 DIAGNOSIS — I440 Atrioventricular block, first degree: Secondary | ICD-10-CM | POA: Insufficient documentation

## 2019-07-15 DIAGNOSIS — R079 Chest pain, unspecified: Secondary | ICD-10-CM | POA: Insufficient documentation

## 2019-07-15 DIAGNOSIS — R9431 Abnormal electrocardiogram [ECG] [EKG]: Secondary | ICD-10-CM | POA: Insufficient documentation

## 2019-07-15 DIAGNOSIS — I5032 Chronic diastolic (congestive) heart failure: Secondary | ICD-10-CM

## 2019-07-15 DIAGNOSIS — I739 Peripheral vascular disease, unspecified: Secondary | ICD-10-CM

## 2019-07-15 DIAGNOSIS — I10 Essential (primary) hypertension: Secondary | ICD-10-CM | POA: Diagnosis present

## 2019-07-15 LAB — MYOCARDIAL PERFUSION IMAGING
LV dias vol: 123 mL (ref 62–150)
LV sys vol: 51 mL
Peak HR: 70 {beats}/min
Rest HR: 65 {beats}/min
SDS: 2
SRS: 0
SSS: 2
TID: 0.97

## 2019-07-15 MED ORDER — TECHNETIUM TC 99M TETROFOSMIN IV KIT
31.4000 | PACK | Freq: Once | INTRAVENOUS | Status: AC | PRN
  Administered 2019-07-15: 31.4 via INTRAVENOUS
  Filled 2019-07-15: qty 32

## 2019-07-15 MED ORDER — TECHNETIUM TC 99M TETROFOSMIN IV KIT
9.8000 | PACK | Freq: Once | INTRAVENOUS | Status: AC | PRN
  Administered 2019-07-15: 9.8 via INTRAVENOUS
  Filled 2019-07-15: qty 10

## 2019-07-15 MED ORDER — REGADENOSON 0.4 MG/5ML IV SOLN
0.4000 mg | Freq: Once | INTRAVENOUS | Status: AC
  Administered 2019-07-15: 0.4 mg via INTRAVENOUS

## 2019-07-15 NOTE — Telephone Encounter (Signed)
LVM to Pt stating to give [place] a  Call back to go over his culture results and the Dr.'s instructions.

## 2019-07-15 NOTE — Telephone Encounter (Signed)
-----   Message from [name], [place] sent at 07/14/2019  8:43 PM EST ----- Let patient know that wound culture was + for bacteria. He should continue taking the Bactrim antibiotic that I sent to pharmacy last week Thanks Dr. [name]

## 2019-07-16 ENCOUNTER — Telehealth: Payer: Self-pay

## 2019-07-16 ENCOUNTER — Ambulatory Visit (INDEPENDENT_AMBULATORY_CARE_PROVIDER_SITE_OTHER): Payer: Medicare Other | Admitting: Podiatry

## 2019-07-16 ENCOUNTER — Other Ambulatory Visit: Payer: Self-pay

## 2019-07-16 ENCOUNTER — Ambulatory Visit: Payer: Medicare Other | Admitting: Sports Medicine

## 2019-07-16 DIAGNOSIS — L97422 Non-pressure chronic ulcer of left heel and midfoot with fat layer exposed: Secondary | ICD-10-CM | POA: Diagnosis not present

## 2019-07-16 DIAGNOSIS — E11610 Type 2 diabetes mellitus with diabetic neuropathic arthropathy: Secondary | ICD-10-CM

## 2019-07-16 DIAGNOSIS — E11621 Type 2 diabetes mellitus with foot ulcer: Secondary | ICD-10-CM | POA: Diagnosis not present

## 2019-07-16 NOTE — Telephone Encounter (Signed)
-----   Message from Landis Martins, [place] sent at 07/14/2019  8:43 PM EST ----- Let patient know that wound culture was + for bacteria. He should continue taking the Bactrim antibiotic that I sent to pharmacy last week Thanks Dr. [name]

## 2019-07-16 NOTE — Telephone Encounter (Signed)
Called Pt again to go over results, Pt responded. I went over with Pt about his wound cx results and the Dr's medication instructions. Pt stated understanding. Pt. States he will see Dr. Jacqualyn Posey today at 27

## 2019-07-17 ENCOUNTER — Encounter (HOSPITAL_COMMUNITY): Payer: Medicare Other

## 2019-07-23 ENCOUNTER — Encounter: Payer: Self-pay | Admitting: Sports Medicine

## 2019-07-23 ENCOUNTER — Ambulatory Visit (INDEPENDENT_AMBULATORY_CARE_PROVIDER_SITE_OTHER): Payer: Medicare Other | Admitting: Sports Medicine

## 2019-07-23 ENCOUNTER — Other Ambulatory Visit: Payer: Self-pay

## 2019-07-23 DIAGNOSIS — L97422 Non-pressure chronic ulcer of left heel and midfoot with fat layer exposed: Secondary | ICD-10-CM | POA: Diagnosis not present

## 2019-07-23 DIAGNOSIS — E11621 Type 2 diabetes mellitus with foot ulcer: Secondary | ICD-10-CM | POA: Diagnosis not present

## 2019-07-23 DIAGNOSIS — I739 Peripheral vascular disease, unspecified: Secondary | ICD-10-CM

## 2019-07-23 DIAGNOSIS — E11610 Type 2 diabetes mellitus with diabetic neuropathic arthropathy: Secondary | ICD-10-CM

## 2019-07-23 DIAGNOSIS — T148XXA Other injury of unspecified body region, initial encounter: Secondary | ICD-10-CM

## 2019-07-23 NOTE — Progress Notes (Signed)
Subjective: [name] is a 68 y.o. male patient seen in office for follow-up evaluation of ulceration of the left foot.  Patient reports that it looks about the same.  Last A1c was recorded at 10 and last blood sugar not recorded. Denies nausea/fever/vomiting/chills/night sweats/shortness of breath/pain. Patient has no other pedal complaints at this time.  Patient Active Problem List   Diagnosis Date Noted  . Uncontrolled type 2 diabetes mellitus with hyperglycemia ([place])   . Hyperkalemia   . Acute on chronic diastolic CHF (congestive heart failure), NYHA class 3 ([place])   . Heart block AV complete ([place])   . Acute renal failure with acute tubular necrosis superimposed on stage 4 chronic kidney disease ([place])   . H/O small bowel obstruction 05/17/2019  . PVD (peripheral vascular disease) ([place]) 05/14/2019  . Ulcer of left foot due to type 2 diabetes mellitus ([place])   . Skin ulcer of left foot, limited to breakdown of skin ([place])   . Charcot foot due to diabetes mellitus ([place])   . Charcot's joint of foot, left   . SBO (small bowel obstruction) ([place]) 05/06/2019  . Complete heart block ([place]) 05/06/2019  . Moderate protein-calorie malnutrition ([place])   . Diabetic foot ulcer ([place]) 10/19/2018  . Diabetic foot infection ([place])   . Pseudophakia, both eyes 10/04/2018  . Stage 3 chronic kidney disease 09/27/2018  . Adrenal insufficiency ([place]) 04/09/2015  . Peripheral edema 09/17/2014  . Shortness of breath 09/17/2014  . Bronchospasm, acute 09/16/2014  . Partial small bowel obstruction ([place]) 09/16/2014  . Hyperglycemia   . Diabetic retinopathy associated with type 2 diabetes mellitus ([place]) 09/13/2014  . Abdominal pain 09/13/2014  . Cough 11/15/2012  . Pharyngitis 09/14/2012  . Fluid overload 09/03/2012  . Ileus following gastrointestinal surgery ([place]) 09/03/2012  . Hypokalemia 08/30/2012  . Small bowel obstruction s/p LOA [id number] 08/21/2012  . Abnormal EKG 08/21/2012  . Chest pain  08/21/2012  . Macular degeneration (senile) of retina 12/15/2011  . Lens replaced 12/15/2011  . Vitreous hemorrhage ([place]) 12/15/2011  . Essential hypertension 08/19/2008  . Regional enteritis of small intestine ([place]) 01/03/2008  . ERECTILE DYSFUNCTION, ORGANIC 01/03/2008  . Sarcoidosis 07/19/2007  . Diabetes mellitus ([place]) 07/19/2007  . HYPERLIPIDEMIA, MIXED 07/19/2007  . CERUMEN IMPACTION, BILATERAL 07/19/2007  . URINARY INCONTINENCE, STRESS, MALE 08/17/2006  . PROSTATE CANCER, HX OF 08/17/2006  . DIABETIC  RETINOPATHY 01/09/2002   Current Outpatient Medications on File Prior to Visit  Medication Sig Dispense Refill  . acetaminophen (TYLENOL) 650 MG CR tablet Take 650 mg by mouth every 8 (eight) hours as needed for pain.    [name] amLODipine (NORVASC) 10 MG tablet Take 1 tablet (10 mg total) by mouth daily. 90 tablet 3  . aspirin 81 MG chewable tablet Chew 81 mg by mouth every morning.     [name] atorvastatin (LIPITOR) 20 MG tablet Take 20 mg by mouth every morning.     . blood glucose meter kit and supplies KIT Dispense based on patient and insurance preference. Use up to four times daily as directed. (FOR ICD-9 250.00, 250.01). 1 each 1  . clopidogrel (PLAVIX) 75 MG tablet Take 1 tablet (75 mg total) by mouth daily with breakfast. 30 tablet 3  . docusate sodium (COLACE) 100 MG capsule Take 100 mg by mouth daily as needed for mild constipation.    . gabapentin (NEURONTIN) 300 MG capsule TAKE 1 CAPSULE(300 MG) BY MOUTH THREE TIMES DAILY (Patient taking differently: Take 300 mg by mouth 2 (  two) times daily. Take 300 mg by mouth in the morning and 300 mg at bedtime- may take an additional 300 mg once a day as needed/as directed) 90 capsule 0  . guaiFENesin (MUCINEX) 600 MG 12 hr tablet Take 600 mg by mouth 2 (two) times daily as needed for to loosen phlegm.    . Insulin Isophane & Regular Human (HUMULIN 70/30 MIX) (70-30) 100 UNIT/ML PEN Inject 16-20 Units into the skin daily before breakfast AND  16-20 Units daily before supper. (Patient taking differently: Inject 16-20 Units into the skin See admin instructions. Inject 16-20 Units into the skin daily before breakfast AND 16-20 Units daily before supper) 15 mL   . Insulin Pen Needle (PEN NEEDLES 31GX5/16") 31G X 8 MM MISC Use new needle with each insulin injection, twice a day. Dx E11.65, Z79.4 100 each 5  . Lancets (FREESTYLE) lancets Use as instructed 100 each 0  . Lancets 30G MISC     . Multiple Vitamins-Minerals (CENTRUM SILVER 50+MEN) TABS Take 1 tablet by mouth daily with breakfast.    . mupirocin ointment (BACTROBAN) 2 % Apply to left foot once daily 22 g 0  . Polyethyl Glycol-Propyl Glycol (SYSTANE OP) Place 1 drop into both eyes as needed (for dryness).     [name] sulfamethoxazole-trimethoprim (BACTRIM) 400-80 MG tablet Take 1 tablet by mouth 2 (two) times daily. 28 tablet 0  . torsemide (DEMADEX) 20 MG tablet Take 2 tablets (40 mg total) by mouth every other day. 180 tablet 3   No current facility-administered medications on file prior to visit.    Allergies  Allergen Reactions  . Ace Inhibitors Itching and Cough  . Naproxen Anaphylaxis, Shortness Of Breath and Other (See Comments)    Throat swells. cannot breathe, and causes GI distress    Recent Results (from the past 2160 hour(s))  WOUND CULTURE     Status: Abnormal   Collection Time: 04/30/19 11:57 AM   Specimen: Wound  Result Value Ref Range   MICRO NUMBER: [id number]    SPECIMEN QUALITY: Adequate    SOURCE: WOUND (SITE NOT SPECIFIED)    STATUS: ADDENDUM - FINAL    GRAM STAIN: Gram positive cocci in chains     Comment: No white blood cells seen No epithelial cells seen Moderate Gram positive cocci in chains Few Gram positive bacilli   ISOLATE 1: Streptococcus agalactiae (A)     Comment: Heavy growth of Group B Streptococcus isolated      Susceptibility   Streptococcus agalactiae - AEROBIC CULT, GRAM STAIN POSITIVE 1    AMPICILLIN <=0.25 Sensitive     CEFOTAXIME  <=0.12 Sensitive     CEFTRIAXONE <=0.12 Sensitive     VANCOMYCIN 0.5 Sensitive     CLINDAMYCIN <=0.25 Sensitive     LEVOFLOXACIN 1 Sensitive     PENO - penicillin* 0.12 Sensitive      * Legend:S = Susceptible  I = IntermediateR = Resistant  NS = Not susceptible* = Not tested  NR = Not reported**NN = See antimicrobic comments  CBC with Differential     Status: Abnormal   Collection Time: 05/05/19  9:30 PM  Result Value Ref Range   WBC 3.6 (L) 4.0 - 10.5 K/uL   RBC 5.14 4.22 - 5.81 MIL/uL   Hemoglobin 10.9 (L) 13.0 - 17.0 g/dL   HCT 34.8 (L) 39.0 - 52.0 %   MCV 67.7 (L) 80.0 - 100.0 fL   MCH 21.2 (L) 26.0 - 34.0 pg   MCHC 31.3  30.0 - 36.0 g/dL   RDW 17.7 (H) 11.5 - 15.5 %   Platelets 202 150 - 400 K/uL   nRBC 0.0 0.0 - 0.2 %   Neutrophils Relative % 52 %   Neutro Abs 1.9 1.7 - 7.7 K/uL   Lymphocytes Relative 29 %   Lymphs Abs 1.1 0.7 - 4.0 K/uL   Monocytes Relative 14 %   Monocytes Absolute 0.5 0.1 - 1.0 K/uL   Eosinophils Relative 4 %   Eosinophils Absolute 0.2 0.0 - 0.5 K/uL   Basophils Relative 1 %   Basophils Absolute 0.0 0.0 - 0.1 K/uL   Immature Granulocytes 0 %   Abs Immature Granulocytes 0.01 0.00 - 0.07 K/uL    Comment: Performed at [facility] [address] [place], [place] [address]  Comprehensive metabolic panel     Status: Abnormal   Collection Time: 05/05/19  9:30 PM  Result Value Ref Range   Sodium 133 (L) 135 - 145 mmol/L   Potassium 4.2 3.5 - 5.1 mmol/L   Chloride 102 98 - 111 mmol/L   CO2 23 22 - 32 mmol/L   Glucose, Bld 127 (H) 70 - 99 mg/dL   BUN 23 8 - 23 mg/dL   Creatinine, Ser 1.69 (H) 0.61 - 1.24 mg/dL   Calcium 8.8 (L) 8.9 - 10.3 mg/dL   Total Protein 6.9 6.5 - 8.1 g/dL   Albumin 3.4 (L) 3.5 - 5.0 g/dL   AST 33 15 - 41 U/L   ALT 28 0 - 44 U/L   Alkaline Phosphatase 77 38 - 126 U/L   Total Bilirubin 1.1 0.3 - 1.2 mg/dL   GFR calc non Af Amer 41 (L) >60 mL/min   GFR calc Af Amer 47 (L) >60 mL/min   Anion gap 8 5 - 15    Comment:  Performed at [facility] [address], [place], [place] [address]  Magnesium     Status: None   Collection Time: 05/05/19  9:30 PM  Result Value Ref Range   Magnesium 1.9 1.7 - 2.4 mg/dL    Comment: Performed at [facility], [place] [address], [place], [place] [address]  Lipase, blood     Status: None   Collection Time: 05/05/19  9:30 PM  Result Value Ref Range   Lipase 19 11 - 51 U/L    Comment: Performed at [facility] [address] [place], [place] [address]  SARS CORONAVIRUS 2 (TAT 6-24 HRS) Nasopharyngeal Nasopharyngeal Swab     Status: None   Collection Time: 05/05/19 10:47 PM   Specimen: Nasopharyngeal Swab  Result Value Ref Range   SARS Coronavirus 2 NEGATIVE NEGATIVE    Comment: (NOTE) SARS-CoV-2 target nucleic acids are NOT DETECTED. The SARS-CoV-2 RNA is generally detectable in upper and lower respiratory specimens during the acute phase of infection. Negative results do not preclude SARS-CoV-2 infection, do not rule out co-infections with other pathogens, and should not be used as the sole basis for treatment or other patient management decisions. Negative results must be combined with clinical observations, patient history, and epidemiological information. The expected result is Negative. Fact Sheet for Patients: SugarRoll.be Fact Sheet for Healthcare Providers: https://www.woods-mathews.com/ This test is not yet approved or cleared by the [place] FDA and  has been authorized for detection and/or diagnosis of SARS-CoV-2 by FDA under an Emergency Use Authorization (EUA). This EUA will remain  in effect (meaning this test can be used) for the  duration of the COVID-19 declaration under Section 56 4(b)(1) of the Act, 21 U.S.C. section 360bbb-3(b)(1), unless the authorization is terminated or revoked sooner. Performed at [facility], [place] [address] [place], [place] [address]   Troponin I  (High Sensitivity)     Status: None   Collection Time: 05/06/19  2:20 AM  Result Value Ref Range   Troponin I (High Sensitivity) 9 <18 ng/L    Comment: (NOTE) Elevated high sensitivity troponin I (hsTnI) values and significant  changes across serial measurements may suggest ACS but many other  chronic and acute conditions are known to elevate hsTnI results.  Refer to the "Links" section for chest pain algorithms and additional  guidance. Performed at [facility], [place] [address], [place], [place] [address]   Cortisol-am, blood     Status: Abnormal   Collection Time: 05/06/19  2:20 AM  Result Value Ref Range   Cortisol - AM 25.6 (H) 6.7 - 22.6 ug/dL    Comment: Performed at [facility] [address] [place], [place] [address]  Culture, blood (Routine X 2) w Reflex to ID Panel     Status: None   Collection Time: 05/06/19  5:00 AM   Specimen: BLOOD LEFT ARM  Result Value Ref Range   Specimen Description BLOOD LEFT ARM    Special Requests      BOTTLES DRAWN AEROBIC AND ANAEROBIC Blood Culture results may not be optimal due to an excessive volume of blood received in culture bottles   Culture      NO GROWTH 5 DAYS Performed at [facility], [place] [address] [place], [place] [address]    Report Status 05/11/2019 FINAL   Cortisol-am, blood     Status: Abnormal   Collection Time: 05/06/19  5:20 AM  Result Value Ref Range   Cortisol - AM 49.1 (H) 6.7 - 22.6 ug/dL    Comment: Performed at [facility], [place] [address], [place], [place] [address]  Troponin I (High Sensitivity)     Status: None   Collection Time: 05/06/19  5:20 AM  Result Value Ref Range   Troponin I (High Sensitivity) 12 <18 ng/L    Comment: (NOTE) Elevated high sensitivity troponin I (hsTnI) values and significant  changes across serial measurements may suggest ACS but many other  chronic and acute conditions are known to elevate hsTnI results.  Refer to the "Links" section for chest pain  algorithms and additional  guidance. Performed at [facility], [place] [address], [place], [name] [address]   Brain natriuretic peptide     Status: Abnormal   Collection Time: 05/06/19  5:20 AM  Result Value Ref Range   B Natriuretic Peptide 133.6 (H) 0.0 - 100.0 pg/mL    Comment: Performed at [facility] [address] [place], [place] [address]  Hemoglobin A1c     Status: Abnormal   Collection Time: 05/06/19  5:20 AM  Result Value Ref Range   Hgb A1c MFr Bld 10.3 (H) 4.8 - 5.6 %    Comment: (NOTE) Pre diabetes:          5.7%-6.4% Diabetes:              >6.4% Glycemic control for   <7.0% adults with diabetes    Mean Plasma Glucose 248.91 mg/dL    Comment: Performed at [facility] [address], [place], [place] [address]  Lipid panel  Status: None   Collection Time: 05/06/19  5:20 AM  Result Value Ref Range   Cholesterol 136 0 - 200 mg/dL   Triglycerides 47 <150 mg/dL   HDL 53 >40 mg/dL   Total CHOL/HDL Ratio 2.6 RATIO   VLDL 9 0 - 40 mg/dL   LDL Cholesterol 74 0 - 99 mg/dL    Comment:        Total Cholesterol/HDL:CHD Risk Coronary Heart Disease Risk Table                     Men   Women  1/2 Average Risk   3.4   3.3  Average Risk       5.0   4.4  2 X Average Risk   9.6   7.1  3 X Average Risk  23.4   11.0        Use the calculated Patient Ratio above and the CHD Risk Table to determine the patient's CHD Risk.        ATP III CLASSIFICATION (LDL):  <100     mg/dL   Optimal  100-129  mg/dL   Near or Above                    Optimal  130-159  mg/dL   Borderline  160-189  mg/dL   High  >190     mg/dL   Very High Performed at [facility] [address], [place], [place] [address]   TSH     Status: None   Collection Time: 05/06/19  5:20 AM  Result Value Ref Range   TSH 1.370 0.350 - 4.500 uIU/mL    Comment: Performed by a 3rd Generation assay with a functional sensitivity of <=0.01 uIU/mL. Performed at [facility],  [place] [address] [place], [place] [address]   Basic metabolic panel     Status: Abnormal   Collection Time: 05/06/19  5:20 AM  Result Value Ref Range   Sodium 136 135 - 145 mmol/L   Potassium 4.8 3.5 - 5.1 mmol/L   Chloride 103 98 - 111 mmol/L   CO2 24 22 - 32 mmol/L   Glucose, Bld 143 (H) 70 - 99 mg/dL   BUN 20 8 - 23 mg/dL   Creatinine, Ser 1.63 (H) 0.61 - 1.24 mg/dL   Calcium 8.8 (L) 8.9 - 10.3 mg/dL   GFR calc non Af Amer 42 (L) >60 mL/min   GFR calc Af Amer 49 (L) >60 mL/min   Anion gap 9 5 - 15    Comment: Performed at [facility] [address] [place], [place] [address]  CBC     Status: Abnormal   Collection Time: 05/06/19  5:20 AM  Result Value Ref Range   WBC 4.7 4.0 - 10.5 K/uL   RBC 5.20 4.22 - 5.81 MIL/uL   Hemoglobin 10.5 (L) 13.0 - 17.0 g/dL   HCT 35.2 (L) 39.0 - 52.0 %   MCV 67.7 (L) 80.0 - 100.0 fL   MCH 20.2 (L) 26.0 - 34.0 pg   MCHC 29.8 (L) 30.0 - 36.0 g/dL   RDW 17.3 (H) 11.5 - 15.5 %   Platelets 210 150 - 400 K/uL    Comment: REPEATED TO VERIFY   nRBC 0.0 0.0 - 0.2 %    Comment: Performed at [facility], [place] [address], [place], [place] [address]  Culture, blood (Routine X 2) w Reflex to ID Panel  Status: None   Collection Time: 05/06/19  5:20 AM   Specimen: BLOOD LEFT HAND  Result Value Ref Range   Specimen Description BLOOD LEFT HAND    Special Requests      BOTTLES DRAWN AEROBIC ONLY Blood Culture results may not be optimal due to an excessive volume of blood received in culture bottles   Culture      NO GROWTH 5 DAYS Performed at [facility] [address] [place], [place] [address]    Report Status 05/11/2019 FINAL   Protime-INR     Status: None   Collection Time: 05/06/19  7:48 AM  Result Value Ref Range   Prothrombin Time 14.7 11.4 - 15.2 seconds   INR 1.2 0.8 - 1.2    Comment: (NOTE) INR goal varies based on device and disease states. Performed at [facility], [place] [address] [place], [place] [address]    APTT     Status: None   Collection Time: 05/06/19  7:48 AM  Result Value Ref Range   aPTT 30 24 - 36 seconds    Comment: Performed at [facility] [address], [place], [place] [address]  Type and screen [place]     Status: None   Collection Time: 05/06/19  7:48 AM  Result Value Ref Range   ABO/RH(D) O POS    Antibody Screen NEG    Sample Expiration      05/09/2019,2359 Performed at [facility], [place] [address], [place], [place] [address]   ABO/Rh     Status: None   Collection Time: 05/06/19  7:48 AM  Result Value Ref Range   ABO/RH(D)      O POS Performed at [facility] [address] [place], [place] [address]   CBG monitoring, ED     Status: Abnormal   Collection Time: 05/06/19  9:30 AM  Result Value Ref Range   Glucose-Capillary 156 (H) 70 - 99 mg/dL  ECHOCARDIOGRAM COMPLETE     Status: None   Collection Time: 05/06/19 10:40 AM  Result Value Ref Range   Weight 2,821.89 oz   Height 67 in   BP 180/79 mmHg  CBG monitoring, ED     Status: None   Collection Time: 05/06/19  1:08 PM  Result Value Ref Range   Glucose-Capillary 91 70 - 99 mg/dL  Glucose, capillary     Status: Abnormal   Collection Time: 05/06/19  6:56 PM  Result Value Ref Range   Glucose-Capillary 63 (L) 70 - 99 mg/dL  Glucose, capillary     Status: Abnormal   Collection Time: 05/06/19  8:04 PM  Result Value Ref Range   Glucose-Capillary 68 (L) 70 - 99 mg/dL  Glucose, capillary     Status: Abnormal   Collection Time: 05/06/19  9:10 PM  Result Value Ref Range   Glucose-Capillary 117 (H) 70 - 99 mg/dL  Glucose, capillary     Status: None   Collection Time: 05/07/19 12:09 AM  Result Value Ref Range   Glucose-Capillary 91 70 - 99 mg/dL  Comprehensive metabolic panel     Status: Abnormal   Collection Time: 05/07/19  3:16 AM  Result Value Ref Range   Sodium 141 135 - 145 mmol/L   Potassium 4.1 3.5 - 5.1 mmol/L   Chloride 110 98 - 111 mmol/L   CO2 21 (L) 22  - 32 mmol/L   Glucose, Bld 86 70 - 99 mg/dL  BUN 21 8 - 23 mg/dL   Creatinine, Ser 1.82 (H) 0.61 - 1.24 mg/dL   Calcium 8.9 8.9 - 10.3 mg/dL   Total Protein 6.3 (L) 6.5 - 8.1 g/dL   Albumin 3.2 (L) 3.5 - 5.0 g/dL   AST 30 15 - 41 U/L   ALT 22 0 - 44 U/L   Alkaline Phosphatase 59 38 - 126 U/L   Total Bilirubin 1.2 0.3 - 1.2 mg/dL   GFR calc non Af Amer 37 (L) >60 mL/min   GFR calc Af Amer 43 (L) >60 mL/min   Anion gap 10 5 - 15    Comment: Performed at [facility] [address] [place], [place] [address]  CBC     Status: Abnormal   Collection Time: 05/07/19  3:16 AM  Result Value Ref Range   WBC 4.7 4.0 - 10.5 K/uL   RBC 5.12 4.22 - 5.81 MIL/uL   Hemoglobin 10.5 (L) 13.0 - 17.0 g/dL   HCT 34.8 (L) 39.0 - 52.0 %   MCV 68.0 (L) 80.0 - 100.0 fL   MCH 20.5 (L) 26.0 - 34.0 pg   MCHC 30.2 30.0 - 36.0 g/dL   RDW 17.4 (H) 11.5 - 15.5 %   Platelets 184 150 - 400 K/uL    Comment: REPEATED TO VERIFY   nRBC 0.0 0.0 - 0.2 %    Comment: Performed at [facility], [place] [address], [place], [place] [address]  Glucose, capillary     Status: None   Collection Time: 05/07/19  4:36 AM  Result Value Ref Range   Glucose-Capillary 87 70 - 99 mg/dL  Glucose, capillary     Status: Abnormal   Collection Time: 05/07/19  8:34 AM  Result Value Ref Range   Glucose-Capillary 107 (H) 70 - 99 mg/dL  Cortisol     Status: None   Collection Time: 05/07/19  9:07 AM  Result Value Ref Range   Cortisol, Plasma 17.7 ug/dL    Comment: (NOTE) AM    6.7 - 22.6 ug/dL PM   <10.0       ug/dL Performed at [facility] [address] [place], [place] [address]   Glucose, capillary     Status: Abnormal   Collection Time: 05/07/19 12:41 PM  Result Value Ref Range   Glucose-Capillary 139 (H) 70 - 99 mg/dL  Glucose, capillary     Status: Abnormal   Collection Time: 05/07/19  4:39 PM  Result Value Ref Range   Glucose-Capillary 148 (H) 70 - 99 mg/dL  Glucose, capillary     Status: Abnormal    Collection Time: 05/07/19  8:06 PM  Result Value Ref Range   Glucose-Capillary 163 (H) 70 - 99 mg/dL  Glucose, capillary     Status: Abnormal   Collection Time: 05/08/19 12:29 AM  Result Value Ref Range   Glucose-Capillary 130 (H) 70 - 99 mg/dL  Comprehensive metabolic panel     Status: Abnormal   Collection Time: 05/08/19  3:44 AM  Result Value Ref Range   Sodium 138 135 - 145 mmol/L   Potassium 3.7 3.5 - 5.1 mmol/L   Chloride 109 98 - 111 mmol/L   CO2 25 22 - 32 mmol/L   Glucose, Bld 119 (H) 70 - 99 mg/dL   BUN 26 (H) 8 - 23 mg/dL   Creatinine, Ser 2.01 (H) 0.61 - 1.24 mg/dL   Calcium 8.5 (L) 8.9 - 10.3 mg/dL   Total Protein 5.4 (  L) 6.5 - 8.1 g/dL   Albumin 2.7 (L) 3.5 - 5.0 g/dL   AST 22 15 - 41 U/L   ALT 20 0 - 44 U/L   Alkaline Phosphatase 49 38 - 126 U/L   Total Bilirubin 1.1 0.3 - 1.2 mg/dL   GFR calc non Af Amer 33 (L) >60 mL/min   GFR calc Af Amer 38 (L) >60 mL/min   Anion gap 4 (L) 5 - 15    Comment: Performed at [facility] [address] [place], [place] [address]  CBC     Status: Abnormal   Collection Time: 05/08/19  3:44 AM  Result Value Ref Range   WBC 5.2 4.0 - 10.5 K/uL   RBC 4.27 4.22 - 5.81 MIL/uL   Hemoglobin 8.8 (L) 13.0 - 17.0 g/dL    Comment: Reticulocyte Hemoglobin testing may be clinically indicated, consider ordering this additional test [id number]    HCT 28.5 (L) 39.0 - 52.0 %   MCV 66.7 (L) 80.0 - 100.0 fL   MCH 20.6 (L) 26.0 - 34.0 pg   MCHC 30.9 30.0 - 36.0 g/dL   RDW 17.4 (H) 11.5 - 15.5 %   Platelets 166 150 - 400 K/uL   nRBC 0.0 0.0 - 0.2 %    Comment: Performed at [facility], [place] [address] [place], [place] [address]  Glucose, capillary     Status: Abnormal   Collection Time: 05/08/19  4:33 AM  Result Value Ref Range   Glucose-Capillary 116 (H) 70 - 99 mg/dL  Glucose, capillary     Status: Abnormal   Collection Time: 05/08/19  7:54 AM  Result Value Ref Range   Glucose-Capillary 108 (H) 70 - 99 mg/dL  Glucose,  capillary     Status: Abnormal   Collection Time: 05/08/19 11:57 AM  Result Value Ref Range   Glucose-Capillary 111 (H) 70 - 99 mg/dL  Glucose, capillary     Status: Abnormal   Collection Time: 05/08/19  5:12 PM  Result Value Ref Range   Glucose-Capillary 119 (H) 70 - 99 mg/dL  Glucose, capillary     Status: Abnormal   Collection Time: 05/08/19 10:02 PM  Result Value Ref Range   Glucose-Capillary 211 (H) 70 - 99 mg/dL   Comment 1 Notify RN    Comment 2 Document in Chart   Comprehensive metabolic panel     Status: Abnormal   Collection Time: 05/09/19  3:00 AM  Result Value Ref Range   Sodium 137 135 - 145 mmol/L   Potassium 3.9 3.5 - 5.1 mmol/L   Chloride 107 98 - 111 mmol/L   CO2 24 22 - 32 mmol/L   Glucose, Bld 233 (H) 70 - 99 mg/dL   BUN 30 (H) 8 - 23 mg/dL   Creatinine, Ser 2.16 (H) 0.61 - 1.24 mg/dL   Calcium 8.3 (L) 8.9 - 10.3 mg/dL   Total Protein 5.4 (L) 6.5 - 8.1 g/dL   Albumin 2.6 (L) 3.5 - 5.0 g/dL   AST 24 15 - 41 U/L   ALT 21 0 - 44 U/L   Alkaline Phosphatase 57 38 - 126 U/L   Total Bilirubin 0.5 0.3 - 1.2 mg/dL   GFR calc non Af Amer 30 (L) >60 mL/min   GFR calc Af Amer 35 (L) >60 mL/min   Anion gap 6 5 - 15    Comment: Performed at [facility], [address] [place], [place] [address]  CBC  Status: Abnormal   Collection Time: 05/09/19  3:00 AM  Result Value Ref Range   WBC 3.2 (L) 4.0 - 10.5 K/uL   RBC 4.15 (L) 4.22 - 5.81 MIL/uL   Hemoglobin 8.5 (L) 13.0 - 17.0 g/dL    Comment: Reticulocyte Hemoglobin testing may be clinically indicated, consider ordering this additional test [id number]    HCT 28.1 (L) 39.0 - 52.0 %   MCV 67.7 (L) 80.0 - 100.0 fL   MCH 20.5 (L) 26.0 - 34.0 pg   MCHC 30.2 30.0 - 36.0 g/dL   RDW 17.2 (H) 11.5 - 15.5 %   Platelets 148 (L) 150 - 400 K/uL    Comment: REPEATED TO VERIFY   nRBC 0.0 0.0 - 0.2 %    Comment: Performed at [facility], [place] [address], [place], [place] [address]  Glucose, capillary     Status:  Abnormal   Collection Time: 05/09/19  7:51 AM  Result Value Ref Range   Glucose-Capillary 199 (H) 70 - 99 mg/dL  Glucose, capillary     Status: Abnormal   Collection Time: 05/09/19 11:49 AM  Result Value Ref Range   Glucose-Capillary 136 (H) 70 - 99 mg/dL  Glucose, capillary     Status: Abnormal   Collection Time: 05/09/19  4:22 PM  Result Value Ref Range   Glucose-Capillary 143 (H) 70 - 99 mg/dL  POCT glucose (manual entry)     Status: Abnormal   Collection Time: 05/17/19 10:13 AM  Result Value Ref Range   POC Glucose 56 (A) 70 - 99 mg/dl  POCT glucose (manual entry)     Status: Abnormal   Collection Time: 05/17/19 10:36 AM  Result Value Ref Range   POC Glucose 63 (A) 70 - 99 mg/dl  POCT glucose (manual entry)     Status: Abnormal   Collection Time: 05/17/19 11:10 AM  Result Value Ref Range   POC Glucose 61 (A) 70 - 99 mg/dl  Glucose, random     Status: Abnormal   Collection Time: 05/17/19 11:10 AM  Result Value Ref Range   Glucose 53 (L) 65 - 99 mg/dL  CBC     Status: Abnormal   Collection Time: 05/17/19 11:12 AM  Result Value Ref Range   WBC 3.6 3.4 - 10.8 x10E3/uL   RBC 5.04 4.14 - 5.80 x10E6/uL   Hemoglobin 9.9 (L) 13.0 - 17.7 g/dL   Hematocrit 33.4 (L) 37.5 - 51.0 %   MCV 66 (L) 79 - 97 fL   MCH 19.6 (L) 26.6 - 33.0 pg   MCHC 29.6 (L) 31.5 - 35.7 g/dL   RDW 18.7 (H) 11.6 - 15.4 %   Platelets 156 150 - 450 x10E3/uL  Comprehensive metabolic panel     Status: Abnormal   Collection Time: 05/17/19 11:12 AM  Result Value Ref Range   Glucose 50 (L) 65 - 99 mg/dL   BUN 14 8 - 27 mg/dL   Creatinine, Ser 1.66 (H) 0.76 - 1.27 mg/dL   GFR calc non Af Amer 41 (L) >59 mL/min/1.73   GFR calc Af Amer 48 (L) >59 mL/min/1.73   BUN/Creatinine Ratio 8 (L) 10 - 24   Sodium 142 134 - 144 mmol/L   Potassium 4.8 3.5 - 5.2 mmol/L   Chloride 106 96 - 106 mmol/L   CO2 24 20 - 29 mmol/L   Calcium 9.3 8.6 - 10.2 mg/dL   Total Protein 6.4 6.0 - 8.5 g/dL   Albumin 3.6 (L) 3.8 -  4.8 g/dL    Globulin, Total 2.8 1.5 - 4.5 g/dL   Albumin/Globulin Ratio 1.3 1.2 - 2.2   Bilirubin Total 0.8 0.0 - 1.2 mg/dL   Alkaline Phosphatase 84 39 - 117 IU/L   AST 30 0 - 40 IU/L   ALT 31 0 - 44 IU/L  POCT glucose (manual entry)     Status: Abnormal   Collection Time: 05/17/19 11:43 AM  Result Value Ref Range   POC Glucose 65 (A) 70 - 99 mg/dl  CBC with Differential     Status: Abnormal   Collection Time: 05/17/19  1:04 PM  Result Value Ref Range   WBC 3.6 (L) 4.0 - 10.5 K/uL   RBC 4.99 4.22 - 5.81 MIL/uL   Hemoglobin 10.5 (L) 13.0 - 17.0 g/dL   HCT 34.0 (L) 39.0 - 52.0 %   MCV 68.1 (L) 80.0 - 100.0 fL   MCH 21.0 (L) 26.0 - 34.0 pg   MCHC 30.9 30.0 - 36.0 g/dL   RDW 17.9 (H) 11.5 - 15.5 %   Platelets 136 (L) 150 - 400 K/uL   nRBC 0.0 0.0 - 0.2 %   Neutrophils Relative % 62 %   Neutro Abs 2.3 1.7 - 7.7 K/uL   Lymphocytes Relative 21 %   Lymphs Abs 0.7 0.7 - 4.0 K/uL   Monocytes Relative 13 %   Monocytes Absolute 0.5 0.1 - 1.0 K/uL   Eosinophils Relative 3 %   Eosinophils Absolute 0.1 0.0 - 0.5 K/uL   Basophils Relative 1 %   Basophils Absolute 0.0 0.0 - 0.1 K/uL   Immature Granulocytes 0 %   Abs Immature Granulocytes 0.01 0.00 - 0.07 K/uL    Comment: Performed at [facility], [address], [place], [place] [address]  Basic metabolic panel     Status: Abnormal   Collection Time: 05/17/19  1:04 PM  Result Value Ref Range   Sodium 139 135 - 145 mmol/L   Potassium 4.1 3.5 - 5.1 mmol/L   Chloride 106 98 - 111 mmol/L   CO2 25 22 - 32 mmol/L   Glucose, Bld 78 70 - 99 mg/dL   BUN 12 8 - 23 mg/dL   Creatinine, Ser 1.55 (H) 0.61 - 1.24 mg/dL   Calcium 9.2 8.9 - 10.3 mg/dL   GFR calc non Af Amer 45 (L) >60 mL/min   GFR calc Af Amer 52 (L) >60 mL/min   Anion gap 8 5 - 15    Comment: Performed at [facility] [address], [place], [place] [address]  SARS Coronavirus 2 by RT PCR (hospital order, performed in [facility] hospital lab) Nasopharyngeal Nasopharyngeal  Swab     Status: None   Collection Time: 05/30/19  6:44 AM   Specimen: Nasopharyngeal Swab  Result Value Ref Range   SARS Coronavirus 2 NEGATIVE NEGATIVE    Comment: (NOTE) If result is NEGATIVE SARS-CoV-2 target nucleic acids are NOT DETECTED. The SARS-CoV-2 RNA is generally detectable in upper and lower  respiratory specimens during the acute phase of infection. The lowest  concentration of SARS-CoV-2 viral copies this assay can detect is 250  copies / mL. A negative result does not preclude SARS-CoV-2 infection  and should not be used as the sole basis for treatment or other  patient management decisions.  A negative result may occur with  improper specimen collection / handling, submission of specimen other  than nasopharyngeal swab, presence of viral mutation(s) within the  areas  targeted by this assay, and inadequate number of viral copies  (<250 copies / mL). A negative result must be combined with clinical  observations, patient history, and epidemiological information. If result is POSITIVE SARS-CoV-2 target nucleic acids are DETECTED. The SARS-CoV-2 RNA is generally detectable in upper and lower  respiratory specimens dur ing the acute phase of infection.  Positive  results are indicative of active infection with SARS-CoV-2.  Clinical  correlation with patient history and other diagnostic information is  necessary to determine patient infection status.  Positive results do  not rule out bacterial infection or co-infection with other viruses. If result is PRESUMPTIVE POSTIVE SARS-CoV-2 nucleic acids MAY BE PRESENT.   A presumptive positive result was obtained on the submitted specimen  and confirmed on repeat testing.  While 2019 novel coronavirus  (SARS-CoV-2) nucleic acids may be present in the submitted sample  additional confirmatory testing may be necessary for epidemiological  and / or clinical management purposes  to differentiate between  SARS-CoV-2 and other  Sarbecovirus currently known to infect humans.  If clinically indicated additional testing with an alternate test  methodology [phone] is advised. The SARS-CoV-2 RNA is generally  detectable in upper and lower respiratory sp ecimens during the acute  phase of infection. The expected result is Negative. Fact Sheet for Patients:  StrictlyIdeas.no Fact Sheet for Healthcare Providers: BankingDealers.co.za This test is not yet approved or cleared by the [place] FDA and has been authorized for detection and/or diagnosis of SARS-CoV-2 by FDA under an Emergency Use Authorization (EUA).  This EUA will remain in effect (meaning this test can be used) for the duration of the COVID-19 declaration under Section 564(b)(1) of the Act, 21 U.S.C. section 360bbb-3(b)(1), unless the authorization is terminated or revoked sooner. Performed at [facility], [place] [address], [place], [place] [address]   I-STAT, [place] 8     Status: Abnormal   Collection Time: 05/30/19  6:56 AM  Result Value Ref Range   Sodium 140 135 - 145 mmol/L   Potassium 3.9 3.5 - 5.1 mmol/L   Chloride 101 98 - 111 mmol/L   BUN 19 8 - 23 mg/dL   Creatinine, Ser 1.70 (H) 0.61 - 1.24 mg/dL   Glucose, Bld 128 (H) 70 - 99 mg/dL   Calcium, Ion 1.22 1.15 - 1.40 mmol/L   TCO2 29 22 - 32 mmol/L   Hemoglobin 12.2 (L) 13.0 - 17.0 g/dL   HCT 36.0 (L) 39.0 - 52.0 %  Glucose, capillary     Status: Abnormal   Collection Time: 05/30/19 11:32 AM  Result Value Ref Range   Glucose-Capillary 123 (H) 70 - 99 mg/dL  Glucose, capillary     Status: Abnormal   Collection Time: 05/30/19  5:23 PM  Result Value Ref Range   Glucose-Capillary 119 (H) 70 - 99 mg/dL   Comment 1 Notify RN    Comment 2 Document in Chart   Glucose, capillary     Status: Abnormal   Collection Time: 05/30/19  9:00 PM  Result Value Ref Range   Glucose-Capillary 225 (H) 70 - 99 mg/dL  Glucose, capillary     Status:  Abnormal   Collection Time: 05/31/19  5:58 AM  Result Value Ref Range   Glucose-Capillary 204 (H) 70 - 99 mg/dL  Glucose, capillary     Status: Abnormal   Collection Time: 05/31/19  7:53 AM  Result Value Ref Range   Glucose-Capillary 194 (H) 70 - 99 mg/dL  Glucose, capillary  Status: Abnormal   Collection Time: 05/31/19 11:45 AM  Result Value Ref Range   Glucose-Capillary 129 (H) 70 - 99 mg/dL  Basic metabolic panel     Status: Abnormal   Collection Time: 06/24/19 12:00 PM  Result Value Ref Range   Glucose 252 (H) 65 - 99 mg/dL   BUN 23 8 - 27 mg/dL   Creatinine, Ser 2.06 (H) 0.76 - 1.27 mg/dL   GFR calc non Af Amer 32 (L) >59 mL/min/1.73   GFR calc Af Amer 37 (L) >59 mL/min/1.73   BUN/Creatinine Ratio 11 10 - 24   Sodium 139 134 - 144 mmol/L   Potassium 5.9 (HH) 3.5 - 5.2 mmol/L    Comment:                   Client Requested Flag   Chloride 99 96 - 106 mmol/L   CO2 23 20 - 29 mmol/L   Calcium 9.3 8.6 - 10.2 mg/dL  Brain natriuretic peptide     Status: Abnormal   Collection Time: 06/24/19 12:00 PM  Result Value Ref Range   BNP 273.1 (H) 0.0 - 100.0 pg/mL  Basic metabolic panel     Status: Abnormal   Collection Time: 06/25/19 10:38 AM  Result Value Ref Range   Sodium 134 (L) 135 - 145 mmol/L   Potassium 5.4 (H) 3.5 - 5.1 mmol/L   Chloride 100 98 - 111 mmol/L   CO2 24 22 - 32 mmol/L   Glucose, Bld 223 (H) 70 - 99 mg/dL   BUN 19 8 - 23 mg/dL   Creatinine, Ser 1.89 (H) 0.61 - 1.24 mg/dL   Calcium 8.7 (L) 8.9 - 10.3 mg/dL   GFR calc non Af Amer 35 (L) >60 mL/min   GFR calc Af Amer 41 (L) >60 mL/min   Anion gap 10 5 - 15    Comment: Performed at [facility], [place] [address], [place], [place] [address]  Magnesium     Status: None   Collection Time: 06/25/19 10:38 AM  Result Value Ref Range   Magnesium 2.1 1.7 - 2.4 mg/dL    Comment: Performed at [facility] [address], [place], [place] [address]  Brain natriuretic peptide (order ONLY if patient c/o  SOB)     Status: Abnormal   Collection Time: 06/25/19 10:38 AM  Result Value Ref Range   B Natriuretic Peptide 294.1 (H) 0.0 - 100.0 pg/mL    Comment: Performed at [facility] [address] [place], [place] [address]  CBC     Status: Abnormal   Collection Time: 06/25/19 10:38 AM  Result Value Ref Range   WBC 4.7 4.0 - 10.5 K/uL   RBC 4.51 4.22 - 5.81 MIL/uL   Hemoglobin 9.5 (L) 13.0 - 17.0 g/dL   HCT 30.5 (L) 39.0 - 52.0 %   MCV 67.6 (L) 80.0 - 100.0 fL   MCH 21.1 (L) 26.0 - 34.0 pg   MCHC 31.1 30.0 - 36.0 g/dL   RDW 16.0 (H) 11.5 - 15.5 %   Platelets 229 150 - 400 K/uL    Comment: REPEATED TO VERIFY   nRBC 0.0 0.0 - 0.2 %    Comment: Performed at [facility], [place] [name] [place], [place] [address]  SARS CORONAVIRUS 2 (TAT 6-24 HRS) Nasopharyngeal Nasopharyngeal Swab     Status: None   Collection Time: 06/25/19  7:45 PM   Specimen: Nasopharyngeal Swab  Result Value  Ref Range   SARS Coronavirus 2 NEGATIVE NEGATIVE    Comment: (NOTE) SARS-CoV-2 target nucleic acids are NOT DETECTED. The SARS-CoV-2 RNA is generally detectable in upper and lower respiratory specimens during the acute phase of infection. Negative results do not preclude SARS-CoV-2 infection, do not rule out co-infections with other pathogens, and should not be used as the sole basis for treatment or other patient management decisions. Negative results must be combined with clinical observations, patient history, and epidemiological information. The expected result is Negative. Fact Sheet for Patients: SugarRoll.be Fact Sheet for Healthcare Providers: https://www.woods-mathews.com/ This test is not yet approved or cleared by the [place] FDA and  has been authorized for detection and/or diagnosis of SARS-CoV-2 by FDA under an Emergency Use Authorization (EUA). This EUA will remain  in effect (meaning this test can be used) for the duration of  the COVID-19 declaration under Section 56 4(b)(1) of the Act, 21 U.S.C. section 360bbb-3(b)(1), unless the authorization is terminated or revoked sooner. Performed at [facility], [place] [address] [place], [place] [address]   CBG monitoring, ED     Status: Abnormal   Collection Time: 06/25/19 11:03 PM  Result Value Ref Range   Glucose-Capillary 297 (H) 70 - 99 mg/dL  Glucose, capillary     Status: Abnormal   Collection Time: 06/26/19  2:56 AM  Result Value Ref Range   Glucose-Capillary 336 (H) 70 - 99 mg/dL   Comment 1 Notify RN    Comment 2 Document in Chart   Glucose, capillary     Status: Abnormal   Collection Time: 06/26/19  7:45 AM  Result Value Ref Range   Glucose-Capillary 169 (H) 70 - 99 mg/dL  Glucose, capillary     Status: Abnormal   Collection Time: 06/26/19 12:47 PM  Result Value Ref Range   Glucose-Capillary 180 (H) 70 - 99 mg/dL   Comment 1 Notify RN    Comment 2 Document in Chart   Basic metabolic panel     Status: Abnormal   Collection Time: 06/26/19  2:22 PM  Result Value Ref Range   Sodium 138 135 - 145 mmol/L   Potassium 3.8 3.5 - 5.1 mmol/L   Chloride 103 98 - 111 mmol/L   CO2 27 22 - 32 mmol/L   Glucose, Bld 208 (H) 70 - 99 mg/dL   BUN 17 8 - 23 mg/dL   Creatinine, Ser 1.89 (H) 0.61 - 1.24 mg/dL   Calcium 8.6 (L) 8.9 - 10.3 mg/dL   GFR calc non Af Amer 35 (L) >60 mL/min   GFR calc Af Amer 41 (L) >60 mL/min   Anion gap 8 5 - 15    Comment: Performed at [facility], [place] [address], [place] [address]  CBC     Status: Abnormal   Collection Time: 06/26/19  2:22 PM  Result Value Ref Range   WBC 2.9 (L) 4.0 - 10.5 K/uL   RBC 4.32 4.22 - 5.81 MIL/uL   Hemoglobin 8.8 (L) 13.0 - 17.0 g/dL    Comment: Reticulocyte Hemoglobin testing may be clinically indicated, consider ordering this additional test [id number]    HCT 28.7 (L) 39.0 - 52.0 %   MCV 66.4 (L) 80.0 - 100.0 fL   MCH 20.4 (L) 26.0 - 34.0 pg   MCHC 30.7 30.0 - 36.0 g/dL    RDW 15.8 (H) 11.5 - 15.5 %   Platelets 224 150 - 400 K/uL    Comment: REPEATED TO  VERIFY   nRBC 0.0 0.0 - 0.2 %    Comment: Performed at [facility], [place] [address], [place], [place] [address]  Glucose, capillary     Status: Abnormal   Collection Time: 06/26/19  5:20 PM  Result Value Ref Range   Glucose-Capillary 224 (H) 70 - 99 mg/dL   Comment 1 Notify RN    Comment 2 Document in Chart   Glucose, capillary     Status: Abnormal   Collection Time: 06/26/19  9:17 PM  Result Value Ref Range   Glucose-Capillary 167 (H) 70 - 99 mg/dL  Basic metabolic panel     Status: Abnormal   Collection Time: 06/27/19  4:38 AM  Result Value Ref Range   Sodium 140 135 - 145 mmol/L   Potassium 3.6 3.5 - 5.1 mmol/L   Chloride 104 98 - 111 mmol/L   CO2 26 22 - 32 mmol/L   Glucose, Bld 131 (H) 70 - 99 mg/dL   BUN 17 8 - 23 mg/dL   Creatinine, Ser 1.92 (H) 0.61 - 1.24 mg/dL   Calcium 8.5 (L) 8.9 - 10.3 mg/dL   GFR calc non Af Amer 35 (L) >60 mL/min   GFR calc Af Amer 40 (L) >60 mL/min   Anion gap 10 5 - 15    Comment: Performed at [facility], [place] [address] [place], [place] [address]  CBC     Status: Abnormal   Collection Time: 06/27/19  4:38 AM  Result Value Ref Range   WBC 5.4 4.0 - 10.5 K/uL   RBC 4.17 (L) 4.22 - 5.81 MIL/uL   Hemoglobin 8.5 (L) 13.0 - 17.0 g/dL    Comment: Reticulocyte Hemoglobin testing may be clinically indicated, consider ordering this additional test [id number]    HCT 27.5 (L) 39.0 - 52.0 %   MCV 65.9 (L) 80.0 - 100.0 fL   MCH 20.4 (L) 26.0 - 34.0 pg   MCHC 30.9 30.0 - 36.0 g/dL   RDW 15.8 (H) 11.5 - 15.5 %   Platelets 233 150 - 400 K/uL    Comment: REPEATED TO VERIFY   nRBC 0.0 0.0 - 0.2 %    Comment: Performed at [facility], [place] [address] [place], [place] [address]  Glucose, capillary     Status: Abnormal   Collection Time: 06/27/19  5:49 AM  Result Value Ref Range   Glucose-Capillary 119 (H) 70 - 99 mg/dL  Glucose, capillary     Status:  Abnormal   Collection Time: 06/27/19  8:32 AM  Result Value Ref Range   Glucose-Capillary 143 (H) 70 - 99 mg/dL  Glucose, capillary     Status: Abnormal   Collection Time: 06/27/19 11:46 AM  Result Value Ref Range   Glucose-Capillary 134 (H) 70 - 99 mg/dL  Glucose, capillary     Status: Abnormal   Collection Time: 06/27/19  4:29 PM  Result Value Ref Range   Glucose-Capillary 119 (H) 70 - 99 mg/dL  Glucose, capillary     Status: Abnormal   Collection Time: 06/27/19  9:11 PM  Result Value Ref Range   Glucose-Capillary 179 (H) 70 - 99 mg/dL   Comment 1 Notify RN    Comment 2 Document in Chart   Basic metabolic panel     Status: Abnormal   Collection Time: 06/28/19  5:33 AM  Result Value Ref Range   Sodium 139 135 - 145 mmol/L   Potassium 4.0 3.5 - 5.1 mmol/L  Chloride 100 98 - 111 mmol/L   CO2 27 22 - 32 mmol/L   Glucose, Bld 121 (H) 70 - 99 mg/dL   BUN 21 8 - 23 mg/dL   Creatinine, Ser 1.85 (H) 0.61 - 1.24 mg/dL   Calcium 8.9 8.9 - 10.3 mg/dL   GFR calc non Af Amer 36 (L) >60 mL/min   GFR calc Af Amer 42 (L) >60 mL/min   Anion gap 12 5 - 15    Comment: Performed at [facility] [address], [place], [place] [address]  Glucose, capillary     Status: Abnormal   Collection Time: 06/28/19  6:10 AM  Result Value Ref Range   Glucose-Capillary 114 (H) 70 - 99 mg/dL   Comment 1 Notify RN    Comment 2 Document in Chart   Brain natriuretic peptide     Status: Abnormal   Collection Time: 06/28/19  7:48 AM  Result Value Ref Range   B Natriuretic Peptide 306.1 (H) 0.0 - 100.0 pg/mL    Comment: Performed at [facility] [address] [place], [place] [address]  Glucose, capillary     Status: Abnormal   Collection Time: 06/28/19 12:23 PM  Result Value Ref Range   Glucose-Capillary 157 (H) 70 - 99 mg/dL  Glucose, capillary     Status: Abnormal   Collection Time: 06/28/19  3:31 PM  Result Value Ref Range   Glucose-Capillary 54 (L) 70 - 99 mg/dL  Glucose, capillary      Status: None   Collection Time: 06/28/19  3:55 PM  Result Value Ref Range   Glucose-Capillary 89 70 - 99 mg/dL  Occult blood card to lab, stool     Status: None   Collection Time: 06/28/19  6:48 PM  Result Value Ref Range   Fecal Occult Bld NEGATIVE NEGATIVE    Comment: Performed at [facility], [place] [address], [place], [place] [address]  Glucose, capillary     Status: Abnormal   Collection Time: 06/28/19  9:09 PM  Result Value Ref Range   Glucose-Capillary 328 (H) 70 - 99 mg/dL  Basic metabolic panel     Status: Abnormal   Collection Time: 06/29/19  5:44 AM  Result Value Ref Range   Sodium 137 135 - 145 mmol/L   Potassium 4.0 3.5 - 5.1 mmol/L   Chloride 95 (L) 98 - 111 mmol/L   CO2 30 22 - 32 mmol/L   Glucose, Bld 218 (H) 70 - 99 mg/dL   BUN 25 (H) 8 - 23 mg/dL   Creatinine, Ser 2.06 (H) 0.61 - 1.24 mg/dL   Calcium 8.8 (L) 8.9 - 10.3 mg/dL   GFR calc non Af Amer 32 (L) >60 mL/min   GFR calc Af Amer 37 (L) >60 mL/min   Anion gap 12 5 - 15    Comment: Performed at [facility] [address], [place], [place] [address]  Glucose, capillary     Status: Abnormal   Collection Time: 06/29/19  6:50 AM  Result Value Ref Range   Glucose-Capillary 194 (H) 70 - 99 mg/dL  Glucose, capillary     Status: Abnormal   Collection Time: 06/29/19  7:32 AM  Result Value Ref Range   Glucose-Capillary 214 (H) 70 - 99 mg/dL  Glucose, capillary     Status: Abnormal   Collection Time: 06/29/19 12:05 PM  Result Value Ref Range   Glucose-Capillary 107 (H) 70 - 99 mg/dL  WOUND  CULTURE     Status: Abnormal   Collection Time: 07/09/19 10:15 AM   Specimen: Abscess; Wound  Result Value Ref Range   MICRO NUMBER: [id number]    SPECIMEN QUALITY: Adequate    SOURCE: WOUND (SITE NOT SPECIFIED)    STATUS: FINAL    GRAM STAIN:      No white blood cells seen Few epithelial cells Many Gram negative bacilli Many Gram positive cocci in pairs Few Gram positive cocci in clusters   ISOLATE 1:  Escherichia coli (A)     Comment: Heavy growth of Escherichia coli   ISOLATE 2: Pseudomonas aeruginosa (A)     Comment: Heavy growth of Pseudomonas aeruginosa      Susceptibility   Escherichia coli - AEROBIC CULT, GRAM STAIN NEGATIVE 1    AMOX/CLAVULANIC 16 Intermediate     AMPICILLIN >=32 Resistant     AMPICILLIN/SULBACTAM >=32 Resistant     CEFAZOLIN* <=4 Not Reportable      * For infections other than uncomplicated UTIcaused by E. coli, K. pneumoniae or P. mirabilis:Cefazolin is resistant if MIC > or = 8 mcg/mL.(Distinguishing susceptible versus intermediatefor isolates with MIC < or = 4 mcg/mL requiresadditional testing.)    CEFEPIME <=1 Sensitive     CEFTRIAXONE <=1 Sensitive     CIPROFLOXACIN <=0.25 Sensitive     LEVOFLOXACIN <=0.12 Sensitive     ERTAPENEM <=0.5 Sensitive     GENTAMICIN <=1 Sensitive     IMIPENEM <=0.25 Sensitive     PIP/TAZO <=4 Sensitive     TOBRAMYCIN <=1 Sensitive     TRIMETH/SULFA <=20 Sensitive    Pseudomonas aeruginosa - AEROBIC CULT, GRAM STAIN NEGATIVE 2    CEFTAZIDIME 4 Sensitive     CEFEPIME 2 Sensitive     CIPROFLOXACIN <=0.25 Sensitive     LEVOFLOXACIN 1 Sensitive     GENTAMICIN <=1 Sensitive     IMIPENEM 2 Sensitive     PIP/TAZO 8 Sensitive     TOBRAMYCIN* <=1 Sensitive      * For infections other than uncomplicated UTIcaused by E. coli, K. pneumoniae or P. mirabilis:Cefazolin is resistant if MIC > or = 8 mcg/mL.(Distinguishing susceptible versus intermediatefor isolates with MIC < or = 4 mcg/mL requiresadditional testing.)Legend:S = Susceptible  I = IntermediateR = Resistant  NS = Not susceptible* = Not tested  NR = Not reported**NN = See antimicrobic comments  Myocardial Perfusion Imaging     Status: None   Collection Time: 07/15/19  1:03 PM  Result Value Ref Range   Rest HR 65 bpm   Rest BP 203/88 mmHg   Peak HR 70 bpm   Peak BP 173/74 mmHg   SSS 2    SRS 0    SDS 2    TID 0.97    LV sys vol 51 mL   LV dias vol 123 62 - 150 mL     Objective: There were no vitals filed for this visit.  General: Patient is awake, alert, oriented x 3 and in no acute distress.  Dermatology: Skin is warm and dry bilateral with a blister with bogginess and drainage at plantar left lateral midfoot with mild reactive keratosis after debrided revealed at pinpoint opening plantarlly and a dry blood blister to medial heel on left. There is no malodor, minimal bloody active drainage, no erythema, no edema. No other acute signs of infection.   Vascular: Dorsalis Pedis pulse = 1/4 Bilateral,  Posterior Tibial pulse = 1/4 Bilateral,  Capillary Fill Time < 5 seconds  Neurologic: Protective sensation diminished bilateral.  Musculosketal: There is Charcot deformity noted on left foot.  Mild pain to previous ulceration area.   dNo results for input(s): GRAMSTAIN, LABORGA in the last 8760 hours.  Assessment and Plan:  Problem List Items Addressed This Visit      Endocrine   Diabetic foot ulcer ([place]) - Primary   Charcot foot due to diabetes mellitus ([place])    Other Visit Diagnoses    PAD (peripheral artery disease) ([place])       Blood blister         -Examined patient and re-discussed the progression of the wound and treatment alternatives. -Blood blister debrided using a tissue nipper and debrided wound plantar forefoot using sterile chisel blade revealing macerated skin and bleeding dermis.  -Continue with Bactrim until completed -Applied betadine and iosorb and patient to do the same at least every other day as instructed  -Continue with postop shoe with Pegasys Plastizote offloading  -Continue with vascular follow-up patient is status post angiogram -Awaiting CROW boot we will wait until healed like previous noted  - Advised patient to go to the ER or return to office if the wound worsens or if constitutional symptoms are present. -Patient to return to the office in 2 weeks for wound care Landis Martins, DPM

## 2019-07-24 NOTE — Progress Notes (Signed)
Subjective: 68 year old male presents outside of evaluation of ulceration left foot.  He states that wound has been getting better.  He has been keeping Iodosorb on the wound as well as offloading shoe.  He denies any swelling or redness or any drainage of pus.  No pain.Denies any systemic complaints such as fevers, chills, nausea, vomiting. No acute changes since last appointment, and no other complaints at this time.   Objective: AAO x3, NAD DP/PT pulses palpable bilaterally, CRT less than 3 seconds Charcot deformity is present.  There is hyperkeratotic tissue present on the plantar lateral midfoot.  Upon debridement small superficial area of skin breakdown but otherwise appears to be much better from what I can say.  There is no drainage or pus there is no swelling or redness or any drainage or pus or any signs of infection. No pain with calf compression, swelling, warmth, erythema  Assessment: Healing wound left foot  Plan: -All treatment options discussed with the patient including all alternatives, risks, complications.  -The risks of the hyperkeratotic tissue for any complications of bleeding today. Continue with iodoform dressing changes daily and continue offloading.  Follow-up with Dr. [name] scheduled.  Monitor closely for any signs or symptoms of infection. -Patient encouraged to call the office with any questions, concerns, change in symptoms.   Trula Slade DPM

## 2019-08-02 ENCOUNTER — Ambulatory Visit: Payer: Medicare Other | Admitting: Internal Medicine

## 2019-08-06 ENCOUNTER — Other Ambulatory Visit: Payer: Self-pay

## 2019-08-06 ENCOUNTER — Ambulatory Visit (INDEPENDENT_AMBULATORY_CARE_PROVIDER_SITE_OTHER): Payer: Medicare Other | Admitting: Sports Medicine

## 2019-08-06 ENCOUNTER — Encounter: Payer: Self-pay | Admitting: Sports Medicine

## 2019-08-06 DIAGNOSIS — E11610 Type 2 diabetes mellitus with diabetic neuropathic arthropathy: Secondary | ICD-10-CM

## 2019-08-06 DIAGNOSIS — L97422 Non-pressure chronic ulcer of left heel and midfoot with fat layer exposed: Secondary | ICD-10-CM

## 2019-08-06 DIAGNOSIS — E11621 Type 2 diabetes mellitus with foot ulcer: Secondary | ICD-10-CM | POA: Diagnosis not present

## 2019-08-06 DIAGNOSIS — T148XXA Other injury of unspecified body region, initial encounter: Secondary | ICD-10-CM

## 2019-08-06 DIAGNOSIS — I739 Peripheral vascular disease, unspecified: Secondary | ICD-10-CM

## 2019-08-06 MED ORDER — SULFAMETHOXAZOLE-TRIMETHOPRIM 400-80 MG PO TABS: 1.0000 | ORAL_TABLET | Freq: Two times a day (BID) | ORAL | 0 refills | Status: DC

## 2019-08-06 NOTE — Progress Notes (Signed)
Subjective: [name] is a 68 y.o. male patient seen in office for follow-up evaluation of ulceration of the left foot.  Patient reports that it looks about the same.  Last A1c was recorded at 10 and last blood sugar not recorded like previous. Admits so pain on lateral side of left foot 6/10. Denies nausea/fever/vomiting/chills/night sweats/shortness of breath. Patient has no other pedal complaints at this time.  Patient Active Problem List   Diagnosis Date Noted  . Uncontrolled type 2 diabetes mellitus with hyperglycemia ([place])   . Hyperkalemia   . Acute on chronic diastolic CHF (congestive heart failure), NYHA class 3 ([place])   . Heart block AV complete ([place])   . Acute renal failure with acute tubular necrosis superimposed on stage 4 chronic kidney disease ([place])   . H/O small bowel obstruction 05/17/2019  . PVD (peripheral vascular disease) ([place]) 05/14/2019  . Ulcer of left foot due to type 2 diabetes mellitus ([place])   . Skin ulcer of left foot, limited to breakdown of skin ([place])   . Charcot foot due to diabetes mellitus ([place])   . Charcot's joint of foot, left   . SBO (small bowel obstruction) ([place]) 05/06/2019  . Complete heart block ([place]) 05/06/2019  . Moderate protein-calorie malnutrition ([place])   . Diabetic foot ulcer ([place]) 10/19/2018  . Diabetic foot infection ([place])   . Pseudophakia, both eyes 10/04/2018  . Stage 3 chronic kidney disease 09/27/2018  . Adrenal insufficiency ([place]) 04/09/2015  . Peripheral edema 09/17/2014  . Shortness of breath 09/17/2014  . Bronchospasm, acute 09/16/2014  . Partial small bowel obstruction ([place]) 09/16/2014  . Hyperglycemia   . Diabetic retinopathy associated with type 2 diabetes mellitus ([place]) 09/13/2014  . Abdominal pain 09/13/2014  . Cough 11/15/2012  . Pharyngitis 09/14/2012  . Fluid overload 09/03/2012  . Ileus following gastrointestinal surgery ([place]) 09/03/2012  . Hypokalemia 08/30/2012  . Small bowel obstruction s/p LOA [id number]  08/21/2012  . Abnormal EKG 08/21/2012  . Chest pain 08/21/2012  . Macular degeneration (senile) of retina 12/15/2011  . Lens replaced 12/15/2011  . Vitreous hemorrhage ([place]) 12/15/2011  . Essential hypertension 08/19/2008  . Regional enteritis of small intestine ([place]) 01/03/2008  . ERECTILE DYSFUNCTION, ORGANIC 01/03/2008  . Sarcoidosis 07/19/2007  . Diabetes mellitus ([place]) 07/19/2007  . HYPERLIPIDEMIA, MIXED 07/19/2007  . CERUMEN IMPACTION, BILATERAL 07/19/2007  . URINARY INCONTINENCE, STRESS, MALE 08/17/2006  . PROSTATE CANCER, HX OF 08/17/2006  . DIABETIC  RETINOPATHY 01/09/2002   Current Outpatient Medications on File Prior to Visit  Medication Sig Dispense Refill  . acetaminophen (TYLENOL) 650 MG CR tablet Take 650 mg by mouth every 8 (eight) hours as needed for pain.    [name] amLODipine (NORVASC) 10 MG tablet Take 1 tablet (10 mg total) by mouth daily. 90 tablet 3  . aspirin 81 MG chewable tablet Chew 81 mg by mouth every morning.     [name] atorvastatin (LIPITOR) 20 MG tablet Take 20 mg by mouth every morning.     . blood glucose meter kit and supplies KIT Dispense based on patient and insurance preference. Use up to four times daily as directed. (FOR ICD-9 250.00, 250.01). 1 each 1  . clopidogrel (PLAVIX) 75 MG tablet Take 1 tablet (75 mg total) by mouth daily with breakfast. 30 tablet 3  . docusate sodium (COLACE) 100 MG capsule Take 100 mg by mouth daily as needed for mild constipation.    . gabapentin (NEURONTIN) 300 MG capsule TAKE 1 CAPSULE(300 MG) BY MOUTH  THREE TIMES DAILY (Patient taking differently: Take 300 mg by mouth 2 (two) times daily. Take 300 mg by mouth in the morning and 300 mg at bedtime- may take an additional 300 mg once a day as needed/as directed) 90 capsule 0  . guaiFENesin (MUCINEX) 600 MG 12 hr tablet Take 600 mg by mouth 2 (two) times daily as needed for to loosen phlegm.    . Insulin Isophane & Regular Human (HUMULIN 70/30 MIX) (70-30) 100 UNIT/ML PEN Inject  16-20 Units into the skin daily before breakfast AND 16-20 Units daily before supper. (Patient taking differently: Inject 16-20 Units into the skin See admin instructions. Inject 16-20 Units into the skin daily before breakfast AND 16-20 Units daily before supper) 15 mL   . Insulin Pen Needle (PEN NEEDLES 31GX5/16") 31G X 8 MM MISC Use new needle with each insulin injection, twice a day. Dx E11.65, Z79.4 100 each 5  . Lancets (FREESTYLE) lancets Use as instructed 100 each 0  . Lancets 30G MISC     . Multiple Vitamins-Minerals (CENTRUM SILVER 50+MEN) TABS Take 1 tablet by mouth daily with breakfast.    . mupirocin ointment (BACTROBAN) 2 % Apply to left foot once daily 22 g 0  . Polyethyl Glycol-Propyl Glycol (SYSTANE OP) Place 1 drop into both eyes as needed (for dryness).     . torsemide (DEMADEX) 20 MG tablet Take 2 tablets (40 mg total) by mouth every other day. 180 tablet 3   No current facility-administered medications on file prior to visit.   Allergies  Allergen Reactions  . Ace Inhibitors Itching and Cough  . Naproxen Anaphylaxis, Shortness Of Breath and Other (See Comments)    Throat swells. cannot breathe, and causes GI distress    Recent Results (from the past 2160 hour(s))  Glucose, capillary     Status: Abnormal   Collection Time: 05/08/19 11:57 AM  Result Value Ref Range   Glucose-Capillary 111 (H) 70 - 99 mg/dL  Glucose, capillary     Status: Abnormal   Collection Time: 05/08/19  5:12 PM  Result Value Ref Range   Glucose-Capillary 119 (H) 70 - 99 mg/dL  Glucose, capillary     Status: Abnormal   Collection Time: 05/08/19 10:02 PM  Result Value Ref Range   Glucose-Capillary 211 (H) 70 - 99 mg/dL   Comment 1 Notify RN    Comment 2 Document in Chart   Comprehensive metabolic panel     Status: Abnormal   Collection Time: 05/09/19  3:00 AM  Result Value Ref Range   Sodium 137 135 - 145 mmol/L   Potassium 3.9 3.5 - 5.1 mmol/L   Chloride 107 98 - 111 mmol/L   CO2 24 22 -  32 mmol/L   Glucose, Bld 233 (H) 70 - 99 mg/dL   BUN 30 (H) 8 - 23 mg/dL   Creatinine, Ser 2.16 (H) 0.61 - 1.24 mg/dL   Calcium 8.3 (L) 8.9 - 10.3 mg/dL   Total Protein 5.4 (L) 6.5 - 8.1 g/dL   Albumin 2.6 (L) 3.5 - 5.0 g/dL   AST 24 15 - 41 U/L   ALT 21 0 - 44 U/L   Alkaline Phosphatase 57 38 - 126 U/L   Total Bilirubin 0.5 0.3 - 1.2 mg/dL   GFR calc non Af Amer 30 (L) >60 mL/min   GFR calc Af Amer 35 (L) >60 mL/min   Anion gap 6 5 - 15    Comment: Performed at [facility],  [address], [place], [place] [address]  CBC     Status: Abnormal   Collection Time: 05/09/19  3:00 AM  Result Value Ref Range   WBC 3.2 (L) 4.0 - 10.5 K/uL   RBC 4.15 (L) 4.22 - 5.81 MIL/uL   Hemoglobin 8.5 (L) 13.0 - 17.0 g/dL    Comment: Reticulocyte Hemoglobin testing may be clinically indicated, consider ordering this additional test [id number]    HCT 28.1 (L) 39.0 - 52.0 %   MCV 67.7 (L) 80.0 - 100.0 fL   MCH 20.5 (L) 26.0 - 34.0 pg   MCHC 30.2 30.0 - 36.0 g/dL   RDW 17.2 (H) 11.5 - 15.5 %   Platelets 148 (L) 150 - 400 K/uL    Comment: REPEATED TO VERIFY   nRBC 0.0 0.0 - 0.2 %    Comment: Performed at [facility], [place] [address] [place],  [address]  Glucose, capillary     Status: Abnormal   Collection Time: 05/09/19  7:51 AM  Result Value Ref Range   Glucose-Capillary 199 (H) 70 - 99 mg/dL  Glucose, capillary     Status: Abnormal   Collection Time: 05/09/19 11:49 AM  Result Value Ref Range   Glucose-Capillary 136 (H) 70 - 99 mg/dL  Glucose, capillary     Status: Abnormal   Collection Time: 05/09/19  4:22 PM  Result Value Ref Range   Glucose-Capillary 143 (H) 70 - 99 mg/dL  POCT glucose (manual entry)     Status: Abnormal   Collection Time: 05/17/19 10:13 AM  Result Value Ref Range   POC Glucose 56 (A) 70 - 99 mg/dl  POCT glucose (manual entry)     Status: Abnormal   Collection Time: 05/17/19 10:36 AM  Result Value Ref Range   POC Glucose 63 (A) 70 - 99 mg/dl   POCT glucose (manual entry)     Status: Abnormal   Collection Time: 05/17/19 11:10 AM  Result Value Ref Range   POC Glucose 61 (A) 70 - 99 mg/dl  Glucose, random     Status: Abnormal   Collection Time: 05/17/19 11:10 AM  Result Value Ref Range   Glucose 53 (L) 65 - 99 mg/dL  CBC     Status: Abnormal   Collection Time: 05/17/19 11:12 AM  Result Value Ref Range   WBC 3.6 3.4 - 10.8 x10E3/uL   RBC 5.04 4.14 - 5.80 x10E6/uL   Hemoglobin 9.9 (L) 13.0 - 17.7 g/dL   Hematocrit 33.4 (L) 37.5 - 51.0 %   MCV 66 (L) 79 - 97 fL   MCH 19.6 (L) 26.6 - 33.0 pg   MCHC 29.6 (L) 31.5 - 35.7 g/dL   RDW 18.7 (H) 11.6 - 15.4 %   Platelets 156 150 - 450 x10E3/uL  Comprehensive metabolic panel     Status: Abnormal   Collection Time: 05/17/19 11:12 AM  Result Value Ref Range   Glucose 50 (L) 65 - 99 mg/dL   BUN 14 8 - 27 mg/dL   Creatinine, Ser 1.66 (H) 0.76 - 1.27 mg/dL   GFR calc non Af Amer 41 (L) >59 mL/min/1.73   GFR calc Af Amer 48 (L) >59 mL/min/1.73   BUN/Creatinine Ratio 8 (L) 10 - 24   Sodium 142 134 - 144 mmol/L   Potassium 4.8 3.5 - 5.2 mmol/L   Chloride 106 96 - 106 mmol/L   CO2 24 20 - 29 mmol/L   Calcium 9.3 8.6 - 10.2 mg/dL   Total  Protein 6.4 6.0 - 8.5 g/dL   Albumin 3.6 (L) 3.8 - 4.8 g/dL   Globulin, Total 2.8 1.5 - 4.5 g/dL   Albumin/Globulin Ratio 1.3 1.2 - 2.2   Bilirubin Total 0.8 0.0 - 1.2 mg/dL   Alkaline Phosphatase 84 39 - 117 IU/L   AST 30 0 - 40 IU/L   ALT 31 0 - 44 IU/L  POCT glucose (manual entry)     Status: Abnormal   Collection Time: 05/17/19 11:43 AM  Result Value Ref Range   POC Glucose 65 (A) 70 - 99 mg/dl  CBC with Differential     Status: Abnormal   Collection Time: 05/17/19  1:04 PM  Result Value Ref Range   WBC 3.6 (L) 4.0 - 10.5 K/uL   RBC 4.99 4.22 - 5.81 MIL/uL   Hemoglobin 10.5 (L) 13.0 - 17.0 g/dL   HCT 34.0 (L) 39.0 - 52.0 %   MCV 68.1 (L) 80.0 - 100.0 fL   MCH 21.0 (L) 26.0 - 34.0 pg   MCHC 30.9 30.0 - 36.0 g/dL   RDW 17.9 (H) 11.5 -  15.5 %   Platelets 136 (L) 150 - 400 K/uL   nRBC 0.0 0.0 - 0.2 %   Neutrophils Relative % 62 %   Neutro Abs 2.3 1.7 - 7.7 K/uL   Lymphocytes Relative 21 %   Lymphs Abs 0.7 0.7 - 4.0 K/uL   Monocytes Relative 13 %   Monocytes Absolute 0.5 0.1 - 1.0 K/uL   Eosinophils Relative 3 %   Eosinophils Absolute 0.1 0.0 - 0.5 K/uL   Basophils Relative 1 %   Basophils Absolute 0.0 0.0 - 0.1 K/uL   Immature Granulocytes 0 %   Abs Immature Granulocytes 0.01 0.00 - 0.07 K/uL    Comment: Performed at [facility], [address], [place], [place] [address]  Basic metabolic panel     Status: Abnormal   Collection Time: 05/17/19  1:04 PM  Result Value Ref Range   Sodium 139 135 - 145 mmol/L   Potassium 4.1 3.5 - 5.1 mmol/L   Chloride 106 98 - 111 mmol/L   CO2 25 22 - 32 mmol/L   Glucose, Bld 78 70 - 99 mg/dL   BUN 12 8 - 23 mg/dL   Creatinine, Ser 1.55 (H) 0.61 - 1.24 mg/dL   Calcium 9.2 8.9 - 10.3 mg/dL   GFR calc non Af Amer 45 (L) >60 mL/min   GFR calc Af Amer 52 (L) >60 mL/min   Anion gap 8 5 - 15    Comment: Performed at [facility] [address], [place], [place] [address]  SARS Coronavirus 2 by RT PCR (hospital order, performed in [facility] hospital lab) Nasopharyngeal Nasopharyngeal Swab     Status: None   Collection Time: 05/30/19  6:44 AM   Specimen: Nasopharyngeal Swab  Result Value Ref Range   SARS Coronavirus 2 NEGATIVE NEGATIVE    Comment: (NOTE) If result is NEGATIVE SARS-CoV-2 target nucleic acids are NOT DETECTED. The SARS-CoV-2 RNA is generally detectable in upper and lower  respiratory specimens during the acute phase of infection. The lowest  concentration of SARS-CoV-2 viral copies this assay can detect is 250  copies / mL. A negative result does not preclude SARS-CoV-2 infection  and should not be used as the sole basis for treatment or other  patient management decisions.  A negative result may occur with  improper specimen collection / handling,  submission of specimen  other  than nasopharyngeal swab, presence of viral mutation(s) within the  areas targeted by this assay, and inadequate number of viral copies  (<250 copies / mL). A negative result must be combined with clinical  observations, patient history, and epidemiological information. If result is POSITIVE SARS-CoV-2 target nucleic acids are DETECTED. The SARS-CoV-2 RNA is generally detectable in upper and lower  respiratory specimens dur ing the acute phase of infection.  Positive  results are indicative of active infection with SARS-CoV-2.  Clinical  correlation with patient history and other diagnostic information is  necessary to determine patient infection status.  Positive results do  not rule out bacterial infection or co-infection with other viruses. If result is PRESUMPTIVE POSTIVE SARS-CoV-2 nucleic acids MAY BE PRESENT.   A presumptive positive result was obtained on the submitted specimen  and confirmed on repeat testing.  While 2019 novel coronavirus  (SARS-CoV-2) nucleic acids may be present in the submitted sample  additional confirmatory testing may be necessary for epidemiological  and / or clinical management purposes  to differentiate between  SARS-CoV-2 and other Sarbecovirus currently known to infect humans.  If clinically indicated additional testing with an alternate test  methodology [phone]) is advised. The SARS-CoV-2 RNA is generally  detectable in upper and lower respiratory sp ecimens during the acute  phase of infection. The expected result is Negative. Fact Sheet for Patients:  StrictlyIdeas.no Fact Sheet for Healthcare Providers: BankingDealers.co.za This test is not yet approved or cleared by the [place] FDA and has been authorized for detection and/or diagnosis of SARS-CoV-2 by FDA under an Emergency Use Authorization (EUA).  This EUA will remain in effect (meaning this test can be  used) for the duration of the COVID-19 declaration under Section 564(b)(1) of the Act, 21 U.S.C. section 360bbb-3(b)(1), unless the authorization is terminated or revoked sooner. Performed at [facility], [place] [address], [place], [place] [address]   I-STAT, [place] 8     Status: Abnormal   Collection Time: 05/30/19  6:56 AM  Result Value Ref Range   Sodium 140 135 - 145 mmol/L   Potassium 3.9 3.5 - 5.1 mmol/L   Chloride 101 98 - 111 mmol/L   BUN 19 8 - 23 mg/dL   Creatinine, Ser 1.70 (H) 0.61 - 1.24 mg/dL   Glucose, Bld 128 (H) 70 - 99 mg/dL   Calcium, Ion 1.22 1.15 - 1.40 mmol/L   TCO2 29 22 - 32 mmol/L   Hemoglobin 12.2 (L) 13.0 - 17.0 g/dL   HCT 36.0 (L) 39.0 - 52.0 %  Glucose, capillary     Status: Abnormal   Collection Time: 05/30/19 11:32 AM  Result Value Ref Range   Glucose-Capillary 123 (H) 70 - 99 mg/dL  Glucose, capillary     Status: Abnormal   Collection Time: 05/30/19  5:23 PM  Result Value Ref Range   Glucose-Capillary 119 (H) 70 - 99 mg/dL   Comment 1 Notify RN    Comment 2 Document in Chart   Glucose, capillary     Status: Abnormal   Collection Time: 05/30/19  9:00 PM  Result Value Ref Range   Glucose-Capillary 225 (H) 70 - 99 mg/dL  Glucose, capillary     Status: Abnormal   Collection Time: 05/31/19  5:58 AM  Result Value Ref Range   Glucose-Capillary 204 (H) 70 - 99 mg/dL  Glucose, capillary     Status: Abnormal   Collection Time: 05/31/19  7:53 AM  Result Value Ref Range  Glucose-Capillary 194 (H) 70 - 99 mg/dL  Glucose, capillary     Status: Abnormal   Collection Time: 05/31/19 11:45 AM  Result Value Ref Range   Glucose-Capillary 129 (H) 70 - 99 mg/dL  Basic metabolic panel     Status: Abnormal   Collection Time: 06/24/19 12:00 PM  Result Value Ref Range   Glucose 252 (H) 65 - 99 mg/dL   BUN 23 8 - 27 mg/dL   Creatinine, Ser 2.06 (H) 0.76 - 1.27 mg/dL   GFR calc non Af Amer 32 (L) >59 mL/min/1.73   GFR calc Af Amer 37 (L) >59 mL/min/1.73    BUN/Creatinine Ratio 11 10 - 24   Sodium 139 134 - 144 mmol/L   Potassium 5.9 (HH) 3.5 - 5.2 mmol/L    Comment:                   Client Requested Flag   Chloride 99 96 - 106 mmol/L   CO2 23 20 - 29 mmol/L   Calcium 9.3 8.6 - 10.2 mg/dL  Brain natriuretic peptide     Status: Abnormal   Collection Time: 06/24/19 12:00 PM  Result Value Ref Range   BNP 273.1 (H) 0.0 - 100.0 pg/mL  Basic metabolic panel     Status: Abnormal   Collection Time: 06/25/19 10:38 AM  Result Value Ref Range   Sodium 134 (L) 135 - 145 mmol/L   Potassium 5.4 (H) 3.5 - 5.1 mmol/L   Chloride 100 98 - 111 mmol/L   CO2 24 22 - 32 mmol/L   Glucose, Bld 223 (H) 70 - 99 mg/dL   BUN 19 8 - 23 mg/dL   Creatinine, Ser 1.89 (H) 0.61 - 1.24 mg/dL   Calcium 8.7 (L) 8.9 - 10.3 mg/dL   GFR calc non Af Amer 35 (L) >60 mL/min   GFR calc Af Amer 41 (L) >60 mL/min   Anion gap 10 5 - 15    Comment: Performed at [facility], [place] [address] [place], [place] [address]  Magnesium     Status: None   Collection Time: 06/25/19 10:38 AM  Result Value Ref Range   Magnesium 2.1 1.7 - 2.4 mg/dL    Comment: Performed at [facility] [address] [place], [place] [address]  Brain natriuretic peptide (order ONLY if patient c/o SOB)     Status: Abnormal   Collection Time: 06/25/19 10:38 AM  Result Value Ref Range   B Natriuretic Peptide 294.1 (H) 0.0 - 100.0 pg/mL    Comment: Performed at [facility] [address], [place], [place] [address]  CBC     Status: Abnormal   Collection Time: 06/25/19 10:38 AM  Result Value Ref Range   WBC 4.7 4.0 - 10.5 K/uL   RBC 4.51 4.22 - 5.81 MIL/uL   Hemoglobin 9.5 (L) 13.0 - 17.0 g/dL   HCT 30.5 (L) 39.0 - 52.0 %   MCV 67.6 (L) 80.0 - 100.0 fL   MCH 21.1 (L) 26.0 - 34.0 pg   MCHC 31.1 30.0 - 36.0 g/dL   RDW 16.0 (H) 11.5 - 15.5 %   Platelets 229 150 - 400 K/uL    Comment: REPEATED TO VERIFY   nRBC 0.0 0.0 - 0.2 %    Comment: Performed at [facility], [place]  [address] [place], [place] [address]  SARS CORONAVIRUS 2 (TAT 6-24 HRS) Nasopharyngeal Nasopharyngeal Swab     Status: None  Collection Time: 06/25/19  7:45 PM   Specimen: Nasopharyngeal Swab  Result Value Ref Range   SARS Coronavirus 2 NEGATIVE NEGATIVE    Comment: (NOTE) SARS-CoV-2 target nucleic acids are NOT DETECTED. The SARS-CoV-2 RNA is generally detectable in upper and lower respiratory specimens during the acute phase of infection. Negative results do not preclude SARS-CoV-2 infection, do not rule out co-infections with other pathogens, and should not be used as the sole basis for treatment or other patient management decisions. Negative results must be combined with clinical observations, patient history, and epidemiological information. The expected result is Negative. Fact Sheet for Patients: SugarRoll.be Fact Sheet for Healthcare Providers: https://www.woods-mathews.com/ This test is not yet approved or cleared by the [place] FDA and  has been authorized for detection and/or diagnosis of SARS-CoV-2 by FDA under an Emergency Use Authorization (EUA). This EUA will remain  in effect (meaning this test can be used) for the duration of the COVID-19 declaration under Section 56 4(b)(1) of the Act, 21 U.S.C. section 360bbb-3(b)(1), unless the authorization is terminated or revoked sooner. Performed at [facility], [place] [address] [place], [place] [address]   CBG monitoring, ED     Status: Abnormal   Collection Time: 06/25/19 11:03 PM  Result Value Ref Range   Glucose-Capillary 297 (H) 70 - 99 mg/dL  Glucose, capillary     Status: Abnormal   Collection Time: 06/26/19  2:56 AM  Result Value Ref Range   Glucose-Capillary 336 (H) 70 - 99 mg/dL   Comment 1 Notify RN    Comment 2 Document in Chart   Glucose, capillary     Status: Abnormal   Collection Time: 06/26/19  7:45 AM  Result Value Ref Range   Glucose-Capillary 169 (H)  70 - 99 mg/dL  Glucose, capillary     Status: Abnormal   Collection Time: 06/26/19 12:47 PM  Result Value Ref Range   Glucose-Capillary 180 (H) 70 - 99 mg/dL   Comment 1 Notify RN    Comment 2 Document in Chart   Basic metabolic panel     Status: Abnormal   Collection Time: 06/26/19  2:22 PM  Result Value Ref Range   Sodium 138 135 - 145 mmol/L   Potassium 3.8 3.5 - 5.1 mmol/L   Chloride 103 98 - 111 mmol/L   CO2 27 22 - 32 mmol/L   Glucose, Bld 208 (H) 70 - 99 mg/dL   BUN 17 8 - 23 mg/dL   Creatinine, Ser 1.89 (H) 0.61 - 1.24 mg/dL   Calcium 8.6 (L) 8.9 - 10.3 mg/dL   GFR calc non Af Amer 35 (L) >60 mL/min   GFR calc Af Amer 41 (L) >60 mL/min   Anion gap 8 5 - 15    Comment: Performed at [facility], [place] [address] [place] [address]  CBC     Status: Abnormal   Collection Time: 06/26/19  2:22 PM  Result Value Ref Range   WBC 2.9 (L) 4.0 - 10.5 K/uL   RBC 4.32 4.22 - 5.81 MIL/uL   Hemoglobin 8.8 (L) 13.0 - 17.0 g/dL    Comment: Reticulocyte Hemoglobin testing may be clinically indicated, consider ordering this additional test [id number]    HCT 28.7 (L) 39.0 - 52.0 %   MCV 66.4 (L) 80.0 - 100.0 fL   MCH 20.4 (L) 26.0 - 34.0 pg   MCHC 30.7 30.0 - 36.0 g/dL   RDW 15.8 (H) 11.5 - 15.5 %  Platelets 224 150 - 400 K/uL    Comment: REPEATED TO VERIFY   nRBC 0.0 0.0 - 0.2 %    Comment: Performed at [facility], [place] [address], [place], [place] [address]  Glucose, capillary     Status: Abnormal   Collection Time: 06/26/19  5:20 PM  Result Value Ref Range   Glucose-Capillary 224 (H) 70 - 99 mg/dL   Comment 1 Notify RN    Comment 2 Document in Chart   Glucose, capillary     Status: Abnormal   Collection Time: 06/26/19  9:17 PM  Result Value Ref Range   Glucose-Capillary 167 (H) 70 - 99 mg/dL  Basic metabolic panel     Status: Abnormal   Collection Time: 06/27/19  4:38 AM  Result Value Ref Range   Sodium 140 135 - 145 mmol/L   Potassium 3.6 3.5 - 5.1  mmol/L   Chloride 104 98 - 111 mmol/L   CO2 26 22 - 32 mmol/L   Glucose, Bld 131 (H) 70 - 99 mg/dL   BUN 17 8 - 23 mg/dL   Creatinine, Ser 1.92 (H) 0.61 - 1.24 mg/dL   Calcium 8.5 (L) 8.9 - 10.3 mg/dL   GFR calc non Af Amer 35 (L) >60 mL/min   GFR calc Af Amer 40 (L) >60 mL/min   Anion gap 10 5 - 15    Comment: Performed at [facility], [place] [address], [place], [place] [address]  CBC     Status: Abnormal   Collection Time: 06/27/19  4:38 AM  Result Value Ref Range   WBC 5.4 4.0 - 10.5 K/uL   RBC 4.17 (L) 4.22 - 5.81 MIL/uL   Hemoglobin 8.5 (L) 13.0 - 17.0 g/dL    Comment: Reticulocyte Hemoglobin testing may be clinically indicated, consider ordering this additional test [id number]    HCT 27.5 (L) 39.0 - 52.0 %   MCV 65.9 (L) 80.0 - 100.0 fL   MCH 20.4 (L) 26.0 - 34.0 pg   MCHC 30.9 30.0 - 36.0 g/dL   RDW 15.8 (H) 11.5 - 15.5 %   Platelets 233 150 - 400 K/uL    Comment: REPEATED TO VERIFY   nRBC 0.0 0.0 - 0.2 %    Comment: Performed at [facility], [place] [address] [place], [place] [address]  Glucose, capillary     Status: Abnormal   Collection Time: 06/27/19  5:49 AM  Result Value Ref Range   Glucose-Capillary 119 (H) 70 - 99 mg/dL  Glucose, capillary     Status: Abnormal   Collection Time: 06/27/19  8:32 AM  Result Value Ref Range   Glucose-Capillary 143 (H) 70 - 99 mg/dL  Glucose, capillary     Status: Abnormal   Collection Time: 06/27/19 11:46 AM  Result Value Ref Range   Glucose-Capillary 134 (H) 70 - 99 mg/dL  Glucose, capillary     Status: Abnormal   Collection Time: 06/27/19  4:29 PM  Result Value Ref Range   Glucose-Capillary 119 (H) 70 - 99 mg/dL  Glucose, capillary     Status: Abnormal   Collection Time: 06/27/19  9:11 PM  Result Value Ref Range   Glucose-Capillary 179 (H) 70 - 99 mg/dL   Comment 1 Notify RN    Comment 2 Document in Chart   Basic metabolic panel     Status: Abnormal   Collection Time: 06/28/19  5:33 AM  Result Value Ref  Range   Sodium 139  135 - 145 mmol/L   Potassium 4.0 3.5 - 5.1 mmol/L   Chloride 100 98 - 111 mmol/L   CO2 27 22 - 32 mmol/L   Glucose, Bld 121 (H) 70 - 99 mg/dL   BUN 21 8 - 23 mg/dL   Creatinine, Ser 1.85 (H) 0.61 - 1.24 mg/dL   Calcium 8.9 8.9 - 10.3 mg/dL   GFR calc non Af Amer 36 (L) >60 mL/min   GFR calc Af Amer 42 (L) >60 mL/min   Anion gap 12 5 - 15    Comment: Performed at [facility] [address] [place], [place] [address]  Glucose, capillary     Status: Abnormal   Collection Time: 06/28/19  6:10 AM  Result Value Ref Range   Glucose-Capillary 114 (H) 70 - 99 mg/dL   Comment 1 Notify RN    Comment 2 Document in Chart   Brain natriuretic peptide     Status: Abnormal   Collection Time: 06/28/19  7:48 AM  Result Value Ref Range   B Natriuretic Peptide 306.1 (H) 0.0 - 100.0 pg/mL    Comment: Performed at [facility] [address] [place], [place] [address]  Glucose, capillary     Status: Abnormal   Collection Time: 06/28/19 12:23 PM  Result Value Ref Range   Glucose-Capillary 157 (H) 70 - 99 mg/dL  Glucose, capillary     Status: Abnormal   Collection Time: 06/28/19  3:31 PM  Result Value Ref Range   Glucose-Capillary 54 (L) 70 - 99 mg/dL  Glucose, capillary     Status: None   Collection Time: 06/28/19  3:55 PM  Result Value Ref Range   Glucose-Capillary 89 70 - 99 mg/dL  Occult blood card to lab, stool     Status: None   Collection Time: 06/28/19  6:48 PM  Result Value Ref Range   Fecal Occult Bld NEGATIVE NEGATIVE    Comment: Performed at [facility], [place] [address], [place], [place] [address]  Glucose, capillary     Status: Abnormal   Collection Time: 06/28/19  9:09 PM  Result Value Ref Range   Glucose-Capillary 328 (H) 70 - 99 mg/dL  Basic metabolic panel     Status: Abnormal   Collection Time: 06/29/19  5:44 AM  Result Value Ref Range   Sodium 137 135 - 145 mmol/L   Potassium 4.0 3.5 - 5.1 mmol/L   Chloride 95 (L) 98 - 111 mmol/L    CO2 30 22 - 32 mmol/L   Glucose, Bld 218 (H) 70 - 99 mg/dL   BUN 25 (H) 8 - 23 mg/dL   Creatinine, Ser 2.06 (H) 0.61 - 1.24 mg/dL   Calcium 8.8 (L) 8.9 - 10.3 mg/dL   GFR calc non Af Amer 32 (L) >60 mL/min   GFR calc Af Amer 37 (L) >60 mL/min   Anion gap 12 5 - 15    Comment: Performed at [facility] [address] [place], [place] [address]  Glucose, capillary     Status: Abnormal   Collection Time: 06/29/19  6:50 AM  Result Value Ref Range   Glucose-Capillary 194 (H) 70 - 99 mg/dL  Glucose, capillary     Status: Abnormal   Collection Time: 06/29/19  7:32 AM  Result Value Ref Range   Glucose-Capillary 214 (H) 70 - 99 mg/dL  Glucose, capillary     Status: Abnormal   Collection Time: 06/29/19 12:05 PM  Result  Value Ref Range   Glucose-Capillary 107 (H) 70 - 99 mg/dL  WOUND CULTURE     Status: Abnormal   Collection Time: 07/09/19 10:15 AM   Specimen: Abscess; Wound  Result Value Ref Range   MICRO NUMBER: [id number]    SPECIMEN QUALITY: Adequate    SOURCE: WOUND (SITE NOT SPECIFIED)    STATUS: FINAL    GRAM STAIN:      No white blood cells seen Few epithelial cells Many Gram negative bacilli Many Gram positive cocci in pairs Few Gram positive cocci in clusters   ISOLATE 1: Escherichia coli (A)     Comment: Heavy growth of Escherichia coli   ISOLATE 2: Pseudomonas aeruginosa (A)     Comment: Heavy growth of Pseudomonas aeruginosa      Susceptibility   Escherichia coli - AEROBIC CULT, GRAM STAIN NEGATIVE 1    AMOX/CLAVULANIC 16 Intermediate     AMPICILLIN >=32 Resistant     AMPICILLIN/SULBACTAM >=32 Resistant     CEFAZOLIN* <=4 Not Reportable      * For infections other than uncomplicated UTIcaused by E. coli, K. pneumoniae or P. mirabilis:Cefazolin is resistant if MIC > or = 8 mcg/mL.(Distinguishing susceptible versus intermediatefor isolates with MIC < or = 4 mcg/mL requiresadditional testing.)    CEFEPIME <=1 Sensitive     CEFTRIAXONE <=1 Sensitive      CIPROFLOXACIN <=0.25 Sensitive     LEVOFLOXACIN <=0.12 Sensitive     ERTAPENEM <=0.5 Sensitive     GENTAMICIN <=1 Sensitive     IMIPENEM <=0.25 Sensitive     PIP/TAZO <=4 Sensitive     TOBRAMYCIN <=1 Sensitive     TRIMETH/SULFA <=20 Sensitive    Pseudomonas aeruginosa - AEROBIC CULT, GRAM STAIN NEGATIVE 2    CEFTAZIDIME 4 Sensitive     CEFEPIME 2 Sensitive     CIPROFLOXACIN <=0.25 Sensitive     LEVOFLOXACIN 1 Sensitive     GENTAMICIN <=1 Sensitive     IMIPENEM 2 Sensitive     PIP/TAZO 8 Sensitive     TOBRAMYCIN* <=1 Sensitive      * For infections other than uncomplicated UTIcaused by E. coli, K. pneumoniae or P. mirabilis:Cefazolin is resistant if MIC > or = 8 mcg/mL.(Distinguishing susceptible versus intermediatefor isolates with MIC < or = 4 mcg/mL requiresadditional testing.)Legend:S = Susceptible  I = IntermediateR = Resistant  NS = Not susceptible* = Not tested  NR = Not reported**NN = See antimicrobic comments  Myocardial Perfusion Imaging     Status: None   Collection Time: 07/15/19  1:03 PM  Result Value Ref Range   Rest HR 65 bpm   Rest BP 203/88 mmHg   Peak HR 70 bpm   Peak BP 173/74 mmHg   SSS 2    SRS 0    SDS 2    TID 0.97    LV sys vol 51 mL   LV dias vol 123 62 - 150 mL    Objective: There were no vitals filed for this visit.  General: Patient is awake, alert, oriented x 3 and in no acute distress.  Dermatology: Skin is warm and dry bilateral with a blister with bogginess and drainage at plantar left lateral midfoot with mild reactive keratosis after debrided revealed at a opening that measures 0.3x0.5cm plantarlly with a severely keratotic with underlying macerated border and a dry blood blister to medial heel on left. There is no malodor, minimal bloody active drainage, no erythema, no edema. No other acute signs of  infection.   Vascular: Dorsalis Pedis pulse = 1/4 Bilateral,  Posterior Tibial pulse = 1/4 Bilateral,  Capillary Fill Time < 5  seconds  Neurologic: Protective sensation diminished bilateral.  Musculosketal: There is Charcot deformity noted on left foot.  Mild pain to previous ulceration at lateral aspect on left.   dNo results for input(s): GRAMSTAIN, LABORGA in the last 8760 hours.  Assessment and Plan:  Problem List Items Addressed This Visit      Endocrine   Diabetic foot ulcer ([place]) - Primary   Charcot foot due to diabetes mellitus ([place])    Other Visit Diagnoses    PAD (peripheral artery disease) ([place])       Blood blister         -Examined patient and re-discussed the progression of the wound and treatment alternatives. -Blood blister debrided using a tissue nipper and debrided wound plantar forefoot using sterile chisel blade revealing macerated skin and bleeding dermis.  -Refilled Bactrim -Applied Iododine cream and patient to do the same at least daily since there is maceration  -Continue with postop shoe with Pegasys Plastizote offloading and applied moleskin to post op shoe -Continue with vascular follow-up patient is status post angiogram -Awaiting CROW boot we will wait until healed like previous noted  - Advised patient to go to the ER or return to office if the wound worsens or if constitutional symptoms are present. -Work excuse given; unable to work at this time due to foot ulcer _Advised walker or cane to help keep pressure off ulceration  -Patient to return to the office in 2 weeks for wound care Landis Martins, DPM

## 2019-08-11 NOTE — Progress Notes (Deleted)
Cardiology Office Note:   Date:  08/11/2019  NAME:  [name]    MRN: [id number] DOB:  25-Sep-1950   PCP:  [name], MD  Cardiologist:  Evalina Field, MD  Electrophysiologist:  None   Referring MD: [name], MD   No chief complaint on file. ***  History of Present Illness:   [name] is a 68 y.o. male with a hx of HFpEF, 1AVB, DM, HTN, PAD s/p left anterior tibial angioplasty 05/30/2019, pulmonary sarcoidosis, CKD who presents for follow-up of HFpEF.  Recently diagnosed with heart failure with preserved ejection fraction.  He was admitted in November for aggressive diuresis.  He also had a concern for complete heart block during hospitalization in October.  He has a very long first-degree AV block at baseline, up to 350 ms.  It is possible he was just having winky block given the irregular nature of the rhythm.  He has tried to complete a monitor at home but has had issues with this due to social situations.  Nonetheless, he reports no episodes of dizziness or syncope while walking.  No strong indication for beta-blocker and no pacing therapy was pursued.  Past Medical History: Past Medical History:  Diagnosis Date  . Cellulitis and abscess of face 10/11/2007   Qualifier: Diagnosis of  By: Amil Amen MD, Benjamine Mola    . Diabetic retinopathy ([place])   . Diabetic retinopathy associated with type 2 diabetes mellitus ([place]) 09/13/2014   He works for [employer]  . Fluid overload 09/03/2012   Post op  . GERD (gastroesophageal reflux disease)   . Visual impairment     Past Surgical History: Past Surgical History:  Procedure Laterality Date  . ABDOMINAL AORTOGRAM W/LOWER EXTREMITY Left 05/30/2019   Procedure: ABDOMINAL AORTOGRAM W/LOWER EXTREMITY;  Surgeon: Marty Heck, MD;  Location: [facility];  Service: Cardiovascular;  Laterality: Left;  . CATARACT EXTRACTION W/ INTRAOCULAR LENS  IMPLANT, BILATERAL    . EYE SURGERY Bilateral    "laser OR for diabetic retinopathy"  . INGUINAL HERNIA REPAIR     [name] 07/12/2010), "don't remember which side"  . LAPAROTOMY  08/23/2012   Procedure: EXPLORATORY LAPAROTOMY;  Surgeon: [name], DO;  Location: [facility];  Service: General;  Laterality: N/A;  exploratory laparotomy with lysis of adhesions  . LYSIS OF ADHESION  08/23/2012   Procedure: LYSIS OF ADHESION;  Surgeon: [name], DO;  Location: [facility];  Service: General;;  . PERIPHERAL VASCULAR BALLOON ANGIOPLASTY  05/30/2019   Procedure: PERIPHERAL VASCULAR BALLOON ANGIOPLASTY;  Surgeon: Marty Heck, MD;  Location: [facility];  Service: Cardiovascular;;  left anterior tibial  . ROBOT ASSISTED LAPAROSCOPIC RADICAL PROSTATECTOMY  2000's   "had to finish manually after machine broke"  . SHOULDER SURGERY  1970's   separation; from playing football"    Current Medications: No outpatient medications have been marked as taking for the 08/12/19 encounter (Appointment) with [name], [name] Freer, MD.     Allergies:    Ace inhibitors and Naproxen   Social History: Social History   Socioeconomic History  . Marital status: Married    Spouse name: Not on file  . Number of children: Not on file  . Years of education: Not on file  . Highest education level: Not on file  Occupational History  . Not on file  Tobacco Use  . Smoking status: Former Smoker    Packs/day: 2.50    Years: 3.00  Pack years: 7.50    Types: Cigarettes    Quit date: 11/09/1966    Years since quitting: 52.7  . Smokeless tobacco: Never Used  Substance and Sexual Activity  . Alcohol use: Yes    Comment: 04/08/2015 "maybe a beer/ or 2 or a glass of wine monthly"  . Drug use: No  . Sexual activity: Yes  Other Topics Concern  . Not on file  Social History Narrative  . Not on file   Social Determinants of Health   Financial Resource Strain:   . Difficulty of Paying Living Expenses: Not on file  Food Insecurity:   . Worried About  Charity fundraiser in the Last Year: Not on file  . Ran Out of Food in the Last Year: Not on file  Transportation Needs:   . Lack of Transportation (Medical): Not on file  . Lack of Transportation (Non-Medical): Not on file  Physical Activity:   . Days of Exercise per Week: Not on file  . Minutes of Exercise per Session: Not on file  Stress:   . Feeling of Stress : Not on file  Social Connections:   . Frequency of Communication with Friends and Family: Not on file  . Frequency of Social Gatherings with Friends and Family: Not on file  . Attends Religious Services: Not on file  . Active Member of Clubs or Organizations: Not on file  . Attends Archivist Meetings: Not on file  . Marital Status: Not on file     Family History: The patient's ***family history includes Diabetes Mellitus II in his mother. There is no history of Colon cancer.  ROS:   All other ROS reviewed and negative. Pertinent positives noted in the HPI.     EKGs/Labs/Other Studies Reviewed:   The following studies were personally reviewed by me today:  EKG:  EKG is *** ordered today.  The ekg ordered today demonstrates ***, and was personally reviewed by me.   NM 07/15/2019  The left ventricular ejection fraction is normal (55-65%).  Nuclear stress EF: 58%. No wall motion abnormalities noted  There was no ST segment deviation noted during stress.  No significant perfusion defects noted. Overall low risk study.  This is a low risk study.  Recent Labs: 05/06/2019: TSH 1.370 05/17/2019: ALT 31 06/25/2019: Magnesium 2.1 06/27/2019: Hemoglobin 8.5; Platelets 233 06/28/2019: B Natriuretic Peptide 306.1 06/29/2019: BUN 25; Creatinine, Ser 2.06; Potassium 4.0; Sodium 137   Recent Lipid Panel    Component Value Date/Time   CHOL 136 05/06/2019 0520   CHOL 158 04/19/2019 1013   TRIG 47 05/06/2019 0520   HDL 53 05/06/2019 0520   HDL 53 04/19/2019 1013   CHOLHDL 2.6 05/06/2019 0520   VLDL 9  05/06/2019 0520   LDLCALC 74 05/06/2019 0520   LDLCALC 87 04/19/2019 1013    Physical Exam:   VS:  There were no vitals taken for this visit.   Wt Readings from Last 3 Encounters:  07/15/19 166 lb (75.3 kg)  07/02/19 166 lb (75.3 kg)  07/02/19 166 lb (75.3 kg)    General: Well nourished, well developed, in no acute distress Heart: Atraumatic, normal size  Eyes: PEERLA, EOMI  Neck: Supple, no JVD Endocrine: No thryomegaly Cardiac: Normal S1, S2; RRR; no murmurs, rubs, or gallops Lungs: Clear to auscultation bilaterally, no wheezing, rhonchi or rales  Abd: Soft, nontender, no hepatomegaly  Ext: No edema, pulses 2+ Musculoskeletal: No deformities, BUE and BLE strength normal and equal Skin:  Warm and dry, no rashes   Neuro: Alert and oriented to person, place, time, and situation, CNII-XII grossly intact, no focal deficits  Psych: Normal mood and affect   ASSESSMENT:   [name] is a 68 y.o. male who presents for the following: No diagnosis found.  PLAN:   There are no diagnoses linked to this encounter.  Disposition: No follow-ups on file.  Medication Adjustments/Labs and Tests Ordered: Current medicines are reviewed at length with the patient today.  Concerns regarding medicines are outlined above.  No orders of the defined types were placed in this encounter.  No orders of the defined types were placed in this encounter.   There are no Patient Instructions on file for this visit.   Signed, Addison Naegeli. Audie Box, [place]  [address] [place], [place] [address] [phone]  08/11/2019 10:48 AM

## 2019-08-12 ENCOUNTER — Telehealth: Payer: Self-pay | Admitting: Cardiovascular Disease

## 2019-08-12 ENCOUNTER — Ambulatory Visit: Payer: Medicare Other | Admitting: Cardiovascular Disease

## 2019-08-12 NOTE — Telephone Encounter (Signed)
Called Mr. Gentle about missed appointment. Left message for him to call and re-schedule.   [place] T. Audie Box, [place]  [address] [place], [place] [address] [phone]  12:50 PM

## 2019-08-13 ENCOUNTER — Telehealth: Payer: Self-pay | Admitting: Cardiovascular Disease

## 2019-08-13 NOTE — Telephone Encounter (Signed)
Left message for patient to call and rescheduled missed appointment with Dr. Audie Box on 08/12/19--per 08/13/19 staff message

## 2019-08-19 ENCOUNTER — Ambulatory Visit (INDEPENDENT_AMBULATORY_CARE_PROVIDER_SITE_OTHER): Payer: Medicare Other | Admitting: Internal Medicine

## 2019-08-19 ENCOUNTER — Other Ambulatory Visit: Payer: Self-pay

## 2019-08-19 ENCOUNTER — Encounter: Payer: Self-pay | Admitting: Internal Medicine

## 2019-08-19 VITALS — BP 130/80 | HR 68 | Ht 66.0 in | Wt 167.0 lb

## 2019-08-19 DIAGNOSIS — E785 Hyperlipidemia, unspecified: Secondary | ICD-10-CM | POA: Diagnosis not present

## 2019-08-19 DIAGNOSIS — E1165 Type 2 diabetes mellitus with hyperglycemia: Secondary | ICD-10-CM | POA: Diagnosis not present

## 2019-08-19 DIAGNOSIS — IMO0002 Reserved for concepts with insufficient information to code with codable children: Secondary | ICD-10-CM

## 2019-08-19 DIAGNOSIS — Z91120 Patient's intentional underdosing of medication regimen due to financial hardship: Secondary | ICD-10-CM | POA: Diagnosis not present

## 2019-08-19 DIAGNOSIS — E11319 Type 2 diabetes mellitus with unspecified diabetic retinopathy without macular edema: Secondary | ICD-10-CM | POA: Diagnosis not present

## 2019-08-19 LAB — POCT GLYCOSYLATED HEMOGLOBIN (HGB A1C): Hemoglobin A1C: 13 % — AB (ref 4.0–5.6)

## 2019-08-19 MED ORDER — INSULIN ISOPHANE & REGULAR (HUMAN 70-30)100 UNIT/ML KWIKPEN: PEN_INJECTOR | SUBCUTANEOUS | Status: DC

## 2019-08-19 MED ORDER — ATORVASTATIN CALCIUM 20 MG PO TABS: 20.0000 mg | ORAL_TABLET | Freq: Every morning | ORAL | 3 refills | Status: DC

## 2019-08-19 MED ORDER — "INSULIN SYRINGE-NEEDLE U-100 31G X 5/16"" 0.5 ML MISC": 5 refills | Status: DC

## 2019-08-19 NOTE — Progress Notes (Signed)
Patient ID: [name], male   DOB: [birth year], 69 y.o.   MRN: [id number]   This visit occurred during the SARS-CoV-2 public health emergency.  Safety protocols were in place, including screening questions prior to the visit, additional usage of staff PPE, and extensive cleaning of exam room while observing appropriate contact time as indicated for disinfecting solutions.   HPI: [name] is a 69 y.o.-year-old male, initially referred by his PCP, Dr. Pamella Pert, returning for follow-up DM2, dx in 1990s, insulin-dependent since 2000, very poorly controlled, with many complications (CKD, diabetic retinopathy, peripheral neuropathy-Charcot foot, diabetic ulcer, erectile dysfunction).  Last visit 3.5 months ago.  He was off insulin completely since last visit as he was homeless. Now back in his home.  Reviewed his HbA1c levels: Lab Results  Component Value Date   HGBA1C 10.3 (H) 05/06/2019   HGBA1C 11.7 (A) 04/19/2019   HGBA1C 12.0 (A) 02/07/2019   HGBA1C 13.9 (H) 10/19/2018   HGBA1C 11.9 (H) 07/18/2018   HGBA1C 16.0 (H) 09/13/2014   HGBA1C 16.3 (H) 08/22/2012   HGBA1C (H) 07/12/2010    16.4 (NOTE)                                                                       According to the ADA Clinical Practice Recommendations for 2011, when HbA1c is used as a screening test:   >=6.5%   Diagnostic of Diabetes Mellitus           (if abnormal result  is confirmed)  5.7-6.4%   Increased risk of developing Diabetes Mellitus  References:Diagnosis and Classification of Diabetes Mellitus,Diabetes XBWI,2035,59(RCBUL 1):S62-S69 and Standards of Medical Care in         Diabetes - 2011,Diabetes Care,2011,34  (Suppl 1):S11-S61.   HGBA1C 10.5 08/19/2008   HGBA1C 12.7 01/03/2008   He is on: - Humulin 70/30 (pens) 16 to 20 units before breakfast and dinner He is not working >> very limited resources.  At last visit, he was telling me that she was stretching his insulin and not take it daily.  At last  visit, he was not checking his sugars and I advised him to obtain the [place].  Now checking once a day: - am: n/c - 2h after b'fast: n/c - before lunch: n/c - 2h after lunch: n/c - before dinner: n/c - 2h after dinner: n/c - bedtime: n/c - nighttime: n/c Lowest sugar was 70 >> ?; he has hypoglycemia awareness in the 70s.  Highest sugar was ?.  Glucometer: ReliOn  Pt's meals are: - Breakfast: bran cereal + almond milk - vanilla - Lunch:  fruit - Dinner: fish, veggies and beans - Snacks: no snacks except popcorn He drinks diet drinks.  -+ CKD, last BUN/creatinine:  Lab Results  Component Value Date   BUN 25 (H) 06/29/2019   BUN 21 06/28/2019   CREATININE 2.06 (H) 06/29/2019   CREATININE 1.85 (H) 06/28/2019  Off losartan 25.  -+ HL; last set of lipids: Lab Results  Component Value Date   CHOL 136 05/06/2019   HDL 53 05/06/2019   LDLCALC 74 05/06/2019   TRIG 47 05/06/2019   CHOLHDL 2.6 05/06/2019  On Lipitor 20, but ran out.   - last eye exam  was in summer 2020: + DR  - he denies numbness and tingling in his feet.  He does have a history of peripheral neuropathy with left Charcot foot.  He is on Neurontin 300 mg 3 times a day.  He sees Dr. [name] and Dr. Adah Perl.  Pt has FH of DM in sisters.  He also has a history of HTN, sarcoidosis, complete heart block, SBO, adrenal insufficiency -however, this is resolved, currently not on steroids.  Recent cortisol level (9 AM) reviewed >> normal: Component     Latest Ref Rng & Units 05/07/2019  Cortisol, Plasma     ug/dL 17.7    ROS: Constitutional: no weight gain/no weight loss, no fatigue, no subjective hyperthermia, no subjective hypothermia Eyes: no blurry vision, no xerophthalmia ENT: no sore throat, no nodules palpated in neck, no dysphagia, no odynophagia, no hoarseness, + tinnitus Cardiovascular: no CP/+ SOB with exertion/no palpitations/+ leg swelling Respiratory: no cough/+ SOB with exertion/no  wheezing Gastrointestinal: no N/no V/no D/ C/no acid reflux Musculoskeletal: no muscle aches/no joint aches Skin: no rashes, no hair loss Neurological: no tremors/no numbness/no tingling/no dizziness  I reviewed pt's medications, allergies, PMH, social hx, family hx, and changes were documented in the history of present illness. Otherwise, unchanged from my initial visit note.  Past Medical History:  Diagnosis Date  . Cellulitis and abscess of face 10/11/2007   Qualifier: Diagnosis of  By: Amil Amen MD, Benjamine Mola    . Diabetic retinopathy ([place])   . Diabetic retinopathy associated with type 2 diabetes mellitus ([place]) 09/13/2014   He works for [employer]  . Fluid overload 09/03/2012   Post op  . GERD (gastroesophageal reflux disease)   . Visual impairment    Past Surgical History:  Procedure Laterality Date  . ABDOMINAL AORTOGRAM W/LOWER EXTREMITY Left 05/30/2019   Procedure: ABDOMINAL AORTOGRAM W/LOWER EXTREMITY;  Surgeon: Marty Heck, MD;  Location: [facility];  Service: Cardiovascular;  Laterality: Left;  . CATARACT EXTRACTION W/ INTRAOCULAR LENS  IMPLANT, BILATERAL    . EYE SURGERY Bilateral    "laser OR for diabetic retinopathy"  . INGUINAL HERNIA REPAIR     [name] 07/12/2010), "don't remember which side"  . LAPAROTOMY  08/23/2012   Procedure: EXPLORATORY LAPAROTOMY;  Surgeon: [name], DO;  Location: [facility];  Service: General;  Laterality: N/A;  exploratory laparotomy with lysis of adhesions  . LYSIS OF ADHESION  08/23/2012   Procedure: LYSIS OF ADHESION;  Surgeon: [name], DO;  Location: [facility];  Service: General;;  . PERIPHERAL VASCULAR BALLOON ANGIOPLASTY  05/30/2019   Procedure: PERIPHERAL VASCULAR BALLOON ANGIOPLASTY;  Surgeon: Marty Heck, MD;  Location: [facility];  Service: Cardiovascular;;  left anterior tibial  . ROBOT ASSISTED LAPAROSCOPIC RADICAL PROSTATECTOMY  2000's   "had to finish manually after machine broke"  .  SHOULDER SURGERY  1970's   separation; from playing football"   Social History   Socioeconomic History  . Marital status: Married    Spouse name: Not on file  . Number of children: 1  . Years of education: Not on file  . Highest education level: Not on file  Occupational History  .  Glass blower/designer  Social Needs  . Financial resource strain: Not on file  . Food insecurity    Worry: Not on file    Inability: Not on file  . Transportation needs    Medical: Not on file    Non-medical: Not on file  Tobacco Use  .  Smoking status: Former Smoker    Packs/day: 2.50    Years: 3.00    Pack years: 7.50    Types: Cigarettes    Quit date: 11/09/1966    Years since quitting: 52.5  . Smokeless tobacco: Never Used  Substance and Sexual Activity  . Alcohol use: Yes    Comment: 04/08/2015 "maybe a beer/ or 2 or a glass of wine monthly"  . Drug use: No   Current Outpatient Medications on File Prior to Visit  Medication Sig Dispense Refill  . acetaminophen (TYLENOL) 650 MG CR tablet Take 650 mg by mouth every 8 (eight) hours as needed for pain.    [name] amLODipine (NORVASC) 10 MG tablet Take 1 tablet (10 mg total) by mouth daily. 90 tablet 3  . aspirin 81 MG chewable tablet Chew 81 mg by mouth every morning.     [name] atorvastatin (LIPITOR) 20 MG tablet Take 20 mg by mouth every morning.     . blood glucose meter kit and supplies KIT Dispense based on patient and insurance preference. Use up to four times daily as directed. (FOR ICD-9 250.00, 250.01). 1 each 1  . clopidogrel (PLAVIX) 75 MG tablet Take 1 tablet (75 mg total) by mouth daily with breakfast. 30 tablet 3  . docusate sodium (COLACE) 100 MG capsule Take 100 mg by mouth daily as needed for mild constipation.    . gabapentin (NEURONTIN) 300 MG capsule TAKE 1 CAPSULE(300 MG) BY MOUTH THREE TIMES DAILY (Patient taking differently: Take 300 mg by mouth 2 (two) times daily. Take 300 mg by mouth in the morning and 300 mg at bedtime- may take an  additional 300 mg once a day as needed/as directed) 90 capsule 0  . guaiFENesin (MUCINEX) 600 MG 12 hr tablet Take 600 mg by mouth 2 (two) times daily as needed for to loosen phlegm.    . Insulin Isophane & Regular Human (HUMULIN 70/30 MIX) (70-30) 100 UNIT/ML PEN Inject 16-20 Units into the skin daily before breakfast AND 16-20 Units daily before supper. (Patient taking differently: Inject 16-20 Units into the skin See admin instructions. Inject 16-20 Units into the skin daily before breakfast AND 16-20 Units daily before supper) 15 mL   . Insulin Pen Needle (PEN NEEDLES 31GX5/16") 31G X 8 MM MISC Use new needle with each insulin injection, twice a day. Dx E11.65, Z79.4 100 each 5  . Lancets (FREESTYLE) lancets Use as instructed 100 each 0  . Lancets 30G MISC     . Multiple Vitamins-Minerals (CENTRUM SILVER 50+MEN) TABS Take 1 tablet by mouth daily with breakfast.    . mupirocin ointment (BACTROBAN) 2 % Apply to left foot once daily 22 g 0  . Polyethyl Glycol-Propyl Glycol (SYSTANE OP) Place 1 drop into both eyes as needed (for dryness).     [name] sulfamethoxazole-trimethoprim (BACTRIM) 400-80 MG tablet Take 1 tablet by mouth 2 (two) times daily. 28 tablet 0  . torsemide (DEMADEX) 20 MG tablet Take 2 tablets (40 mg total) by mouth every other day. 180 tablet 3   No current facility-administered medications on file prior to visit.   Allergies  Allergen Reactions  . Ace Inhibitors Itching and Cough  . Naproxen Anaphylaxis, Shortness Of Breath and Other (See Comments)    Throat swells. cannot breathe, and causes GI distress   Family History  Problem Relation Age of Onset  . Diabetes Mellitus II Mother   . Colon cancer Neg Hx     PE: BP 130/80  Pulse 68   Ht 5' 6" (1.676 m)   Wt 167 lb (75.8 kg)   SpO2 99%   BMI 26.95 kg/m  Wt Readings from Last 3 Encounters:  08/19/19 167 lb (75.8 kg)  07/15/19 166 lb (75.3 kg)  07/02/19 166 lb (75.3 kg)   Constitutional: Slightly overweight, in  NAD Eyes: PERRLA, EOMI, no exophthalmos ENT: moist mucous membranes, no thyromegaly, no cervical lymphadenopathy Cardiovascular: RRR, No MRG, + bilateral LE edema-pitting Respiratory: CTA B Gastrointestinal: abdomen soft, NT, ND, BS+ Musculoskeletal: no deformities, strength intact in all 4 Skin: moist, warm, no rashes Neurological: no tremor with outstretched hands, DTR normal in all 4  ASSESSMENT: 1. DM2, insulin-dependent, uncontrolled, with complications - CKD stage 3 - DR - L foot diabetic ulcer - PN with Charcot foot - ED  2. HL  3.  Financial difficulties  PLAN:  1. Patient with longstanding, very uncontrolled diabetes, noncompliant with his regimen of premixed insulin due to many factors including finances.  At last visit he was telling me that he had very limited resources and he could not afford other types of diabetic medications and he had to stretch his premixed insulin and missed doses.  At last visit, he was given 70/30 insulin pens from the hospital but he cannot afford these.  We discussed about vials as being more for the ways to get his insulin.  At that time, we also discussed about the importance of taking the insulin as prescribed and not missing doses.  We also discussed about improving his diet especially changing breakfast, to help with controlling blood sugars later in the day.  I did not change his 70/30 insulin doses significant at last visit since we discussed about focusing on getting the insulin doses in consistently first, but we discussed about dosing insulin based on the size of his meals. -At this visit, he was completely off insulin and was homeless since last visit as he had no income other than the stimulus check which she now used to get back into his house.  Today he will go to the pharmacy to get his insulin.  I sent a new prescription for 70/30 insulin and also syringes to [facility], but I advised him that, if this is too expensive, to go to [facility]  and get these without prescription. We will restart at the same doses as suggested at last visit and I discussed with him how to use vials.  I also gave him written instructions for this. -I advised him to let me know getting his insulin. - I suggested to:  Patient Instructions  Please continue: - 70/30 (ReliOn) insulin 16 to 20 units before breakfast and 16 to 20 units before dinner  Please return in 3 months with your sugar log.   - we checked his HbA1c: 13% (higher) - advised to check sugars at different times of the day - 2x a day, rotating check times - advised for yearly eye exams >> he is UTD - return to clinic in 3 months  2. HL - Reviewed latest lipid panel from 04/2019: Fractions at goal Lab Results  Component Value Date   CHOL 136 05/06/2019   HDL 53 05/06/2019   LDLCALC 74 05/06/2019   TRIG 47 05/06/2019   CHOLHDL 2.6 05/06/2019  -Previously on Lipitor 20 without side effects, but ran out >> refilled it today >> advised to restart.  3.  Financial difficulties - leading to underdosing of insulin or being completely off insulin, as happened  since last visit - Please see problem #1  Philemon Kingdom, MD PhD [facility] Endocrinology

## 2019-08-19 NOTE — Addendum Note (Signed)
Addended by: Cardell Peach I on: 08/19/2019 08:31 AM   Modules accepted: Orders

## 2019-08-19 NOTE — Patient Instructions (Addendum)
Please restart: - 70/30 (ReliOn) insulin 16 to 20 units before breakfast and 16 to 20 units before dinner. Please inject insulin 30 min before a meal.  Please buy a ReliOn glucometer and strips from [facility] and check sugars 2x a day.  Please return in 3 months with your sugar log.

## 2019-08-20 ENCOUNTER — Encounter: Payer: Self-pay | Admitting: Sports Medicine

## 2019-08-20 ENCOUNTER — Ambulatory Visit: Payer: Medicare Other | Admitting: Sports Medicine

## 2019-08-20 VITALS — BP 141/79 | HR 58 | Temp 97.5°F

## 2019-08-20 DIAGNOSIS — E11610 Type 2 diabetes mellitus with diabetic neuropathic arthropathy: Secondary | ICD-10-CM

## 2019-08-20 DIAGNOSIS — E11621 Type 2 diabetes mellitus with foot ulcer: Secondary | ICD-10-CM

## 2019-08-20 DIAGNOSIS — I739 Peripheral vascular disease, unspecified: Secondary | ICD-10-CM

## 2019-08-20 DIAGNOSIS — L97422 Non-pressure chronic ulcer of left heel and midfoot with fat layer exposed: Secondary | ICD-10-CM | POA: Diagnosis not present

## 2019-08-20 MED ORDER — SULFAMETHOXAZOLE-TRIMETHOPRIM 400-80 MG PO TABS: 1.0000 | ORAL_TABLET | Freq: Two times a day (BID) | ORAL | 0 refills | Status: DC

## 2019-08-20 NOTE — Progress Notes (Signed)
Subjective: [name] is a 69 y.o. male patient seen in office for follow-up evaluation of ulceration of the left foot.  Patient reports that he didn't change it dressing because wife could not help for a few days.  Last A1c was recorded at 10 and last blood sugar not recorded like previous. Admits so pain on lateral side of left foot 6/10. Denies nausea/fever/vomiting/chills/night sweats/shortness of breath. Patient has no other pedal complaints at this time.  Patient Active Problem List   Diagnosis Date Noted  . Dyslipidemia 08/19/2019  . Uncontrolled type 2 diabetes mellitus with hyperglycemia ([place])   . Hyperkalemia   . Acute on chronic diastolic CHF (congestive heart failure), NYHA class 3 ([place])   . Heart block AV complete ([place])   . Acute renal failure with acute tubular necrosis superimposed on stage 4 chronic kidney disease ([place])   . H/O small bowel obstruction 05/17/2019  . PVD (peripheral vascular disease) ([place]) 05/14/2019  . Ulcer of left foot due to type 2 diabetes mellitus ([place])   . Skin ulcer of left foot, limited to breakdown of skin ([place])   . Charcot foot due to diabetes mellitus ([place])   . Charcot's joint of foot, left   . SBO (small bowel obstruction) ([place]) 05/06/2019  . Complete heart block ([place]) 05/06/2019  . Moderate protein-calorie malnutrition ([place])   . Diabetic foot ulcer ([place]) 10/19/2018  . Diabetic foot infection ([place])   . Pseudophakia, both eyes 10/04/2018  . Stage 3 chronic kidney disease 09/27/2018  . Adrenal insufficiency ([place]) 04/09/2015  . Peripheral edema 09/17/2014  . Shortness of breath 09/17/2014  . Bronchospasm, acute 09/16/2014  . Partial small bowel obstruction ([place]) 09/16/2014  . Hyperglycemia   . Diabetic retinopathy associated with type 2 diabetes mellitus ([place]) 09/13/2014  . Abdominal pain 09/13/2014  . Cough 11/15/2012  . Pharyngitis 09/14/2012  . Fluid overload 09/03/2012  . Ileus following gastrointestinal surgery ([place])  09/03/2012  . Hypokalemia 08/30/2012  . Small bowel obstruction s/p LOA [id number] 08/21/2012  . Abnormal EKG 08/21/2012  . Chest pain 08/21/2012  . Macular degeneration (senile) of retina 12/15/2011  . Lens replaced 12/15/2011  . Vitreous hemorrhage ([place]) 12/15/2011  . Essential hypertension 08/19/2008  . Regional enteritis of small intestine ([place]) 01/03/2008  . ERECTILE DYSFUNCTION, ORGANIC 01/03/2008  . Sarcoidosis 07/19/2007  . Type 2 diabetes, uncontrolled, with retinopathy ([place]) 07/19/2007  . HYPERLIPIDEMIA, MIXED 07/19/2007  . CERUMEN IMPACTION, BILATERAL 07/19/2007  . URINARY INCONTINENCE, STRESS, MALE 08/17/2006  . PROSTATE CANCER, HX OF 08/17/2006  . DIABETIC  RETINOPATHY 01/09/2002   Current Outpatient Medications on File Prior to Visit  Medication Sig Dispense Refill  . acetaminophen (TYLENOL) 650 MG CR tablet Take 650 mg by mouth every 8 (eight) hours as needed for pain.    [name] amLODipine (NORVASC) 10 MG tablet Take 1 tablet (10 mg total) by mouth daily. 90 tablet 3  . aspirin 81 MG chewable tablet Chew 81 mg by mouth every morning.     [name] atorvastatin (LIPITOR) 20 MG tablet Take 1 tablet (20 mg total) by mouth every morning. 90 tablet 3  . blood glucose meter kit and supplies KIT Dispense based on patient and insurance preference. Use up to four times daily as directed. (FOR ICD-9 250.00, 250.01). 1 each 1  . clopidogrel (PLAVIX) 75 MG tablet Take 1 tablet (75 mg total) by mouth daily with breakfast. 30 tablet 3  . docusate sodium (COLACE) 100 MG capsule Take 100 mg by mouth daily  as needed for mild constipation.    . gabapentin (NEURONTIN) 300 MG capsule TAKE 1 CAPSULE(300 MG) BY MOUTH THREE TIMES DAILY (Patient taking differently: Take 300 mg by mouth 2 (two) times daily. Take 300 mg by mouth in the morning and 300 mg at bedtime- may take an additional 300 mg once a day as needed/as directed) 90 capsule 0  . guaiFENesin (MUCINEX) 600 MG 12 hr tablet Take 600 mg by mouth 2 (two)  times daily as needed for to loosen phlegm.    . Insulin Isophane & Regular Human (HUMULIN 70/30 MIX) (70-30) 100 UNIT/ML PEN Inject 16-20 Units into the skin daily before breakfast AND 16-20 Units daily before supper. 15 mL   . Insulin Syringe-Needle U-100 (BD INSULIN SYRINGE ULTRAFINE) 31G X 5/16" 0.5 ML MISC Use 2x a day for insulin 100 each 5  . Lancets (FREESTYLE) lancets Use as instructed 100 each 0  . Lancets 30G MISC     . Multiple Vitamins-Minerals (CENTRUM SILVER 50+MEN) TABS Take 1 tablet by mouth daily with breakfast.    . mupirocin ointment (BACTROBAN) 2 % Apply to left foot once daily 22 g 0  . Polyethyl Glycol-Propyl Glycol (SYSTANE OP) Place 1 drop into both eyes as needed (for dryness).     . torsemide (DEMADEX) 20 MG tablet Take 2 tablets (40 mg total) by mouth every other day. 180 tablet 3   No current facility-administered medications on file prior to visit.   Allergies  Allergen Reactions  . Ace Inhibitors Itching and Cough  . Naproxen Anaphylaxis, Shortness Of Breath and Other (See Comments)    Throat swells. cannot breathe, and causes GI distress      Objective: There were no vitals filed for this visit.  General: Patient is awake, alert, oriented x 3 and in no acute distress.  Dermatology: Skin is warm and dry bilateral with a blister with bogginess and drainage at plantar left lateral midfoot with mild reactive keratosis after debrided revealed at a opening that measures 0.9x0.5cm plantarlly with a severely keratotic with underlying macerated border. There is no malodor, minimal bloody active drainage, no erythema, no edema. No other acute signs of infection.   Vascular: Dorsalis Pedis pulse = 1/4 Bilateral,  Posterior Tibial pulse = 1/4 Bilateral,  Capillary Fill Time < 5 seconds  Neurologic: Protective sensation diminished bilateral.  Musculosketal: There is Charcot deformity noted on left foot.  Mild pain to previous ulceration at lateral aspect on  left.   dNo results for input(s): GRAMSTAIN, LABORGA in the last 8760 hours.  Assessment and Plan:  Problem List Items Addressed This Visit      Endocrine   Diabetic foot ulcer ([place]) - Primary   Charcot foot due to diabetes mellitus ([place])    Other Visit Diagnoses    PAD (peripheral artery disease) ([place])         -Examined patient and re-discussed the progression of the wound and treatment alternatives. -Mechanically debrided wound plantar forefoot using sterile chisel blade revealing macerated skin and bleeding dermis.  -Refilled Bactrim since patient can not remember if he took the medication  -Applied Medihoney to wound and advised patient that we will order home nurse since he is unable to change dressing himself and wife does not help consistently -Continue with postop shoe with Pegasys Plastizote offloading and applied moleskin and additional padding  to post op shoe -Continue with vascular follow-up patient is status post angiogram -Awaiting CROW boot we will wait until healed like previous  noted and I reminded patient of this again at this visit  - Advised patient to go to the ER or return to office if the wound worsens or if constitutional symptoms are present. -Advised walker or cane or electric wheelchair to help keep pressure off ulceration  -Patient to return to the office in 2 weeks for wound care Landis Martins, DPM

## 2019-08-21 ENCOUNTER — Telehealth: Payer: Self-pay | Admitting: *Deleted

## 2019-08-21 MED ORDER — MEDIHONEY WOUND/BURN DRESSING EX GEL: CUTANEOUS | 1 refills | Status: AC

## 2019-08-21 NOTE — Telephone Encounter (Signed)
-----   Message from Landis Martins, [place] sent at 08/20/2019  9:31 PM EST ----- Regarding: Home nursing wound care Medihoney and dry dressing 2-3x per week

## 2019-08-21 NOTE — Telephone Encounter (Signed)
Yes his wife can help change the dressing

## 2019-08-21 NOTE — Telephone Encounter (Signed)
Faxed required form, clinicals and demographics to [facility]. 

## 2019-08-21 NOTE — Progress Notes (Deleted)
Cardiology Office Note:   Date:  08/21/2019  NAME:  [name]    MRN: [id number] DOB:  01-14-1951   PCP:  [name], MD  Cardiologist:  Evalina Field, MD  Electrophysiologist:  None   Referring MD: [name], MD   No chief complaint on file. ***  History of Present Illness:   [name] is a 69 y.o. male with a hx of PAD, HFpEF, hypertension, CKD, first-degree block who presents for follow-up of HFpEF.   Problem List 1. PAD s/p left anterior tibial angioplasty (05/30/2019) 2. HFpEF (06/25/2019) 3. CKD 3/4 4. 1AVB (350 ms, questionable CHB in past but likely Wenkebach) 5. Negative NM MPI (07/15/2019) 6. HTN  LDL 74 HDL 53 T chol 136 TG 47 (04/2019) A1c 13 (08/2019)  Past Medical History: Past Medical History:  Diagnosis Date  . Cellulitis and abscess of face 10/11/2007   Qualifier: Diagnosis of  By: Amil Amen MD, Benjamine Mola    . Diabetic retinopathy ([place])   . Diabetic retinopathy associated with type 2 diabetes mellitus ([place]) 09/13/2014   He works for [employer]  . Fluid overload 09/03/2012   Post op  . GERD (gastroesophageal reflux disease)   . Visual impairment     Past Surgical History: Past Surgical History:  Procedure Laterality Date  . ABDOMINAL AORTOGRAM W/LOWER EXTREMITY Left 05/30/2019   Procedure: ABDOMINAL AORTOGRAM W/LOWER EXTREMITY;  Surgeon: Marty Heck, MD;  Location: [facility];  Service: Cardiovascular;  Laterality: Left;  . CATARACT EXTRACTION W/ INTRAOCULAR LENS  IMPLANT, BILATERAL    . EYE SURGERY Bilateral    "laser OR for diabetic retinopathy"  . INGUINAL HERNIA REPAIR     [name] 07/12/2010), "don't remember which side"  . LAPAROTOMY  08/23/2012   Procedure: EXPLORATORY LAPAROTOMY;  Surgeon: [name], DO;  Location: [facility];  Service: General;  Laterality: N/A;  exploratory laparotomy with lysis of adhesions  . LYSIS OF ADHESION  08/23/2012   Procedure: LYSIS OF ADHESION;  Surgeon: [name], DO;  Location: [facility];  Service: General;;  . PERIPHERAL VASCULAR BALLOON ANGIOPLASTY  05/30/2019   Procedure: PERIPHERAL VASCULAR BALLOON ANGIOPLASTY;  Surgeon: Marty Heck, MD;  Location: [facility];  Service: Cardiovascular;;  left anterior tibial  . ROBOT ASSISTED LAPAROSCOPIC RADICAL PROSTATECTOMY  2000's   "had to finish manually after machine broke"  . SHOULDER SURGERY  1970's   separation; from playing football"    Current Medications: No outpatient medications have been marked as taking for the 08/22/19 encounter (Appointment) with O'Neal, Cassie Freer, MD.     Allergies:    Ace inhibitors and Naproxen   Social History: Social History   Socioeconomic History  . Marital status: Married    Spouse name: Not on file  . Number of children: Not on file  . Years of education: Not on file  . Highest education level: Not on file  Occupational History  . Not on file  Tobacco Use  . Smoking status: Former Smoker    Packs/day: 2.50    Years: 3.00    Pack years: 7.50    Types: Cigarettes    Quit date: 11/09/1966    Years since quitting: 52.8  . Smokeless tobacco: Never Used  Substance and Sexual Activity  . Alcohol use: Yes    Comment: 04/08/2015 "maybe a beer/ or 2 or a glass of wine monthly"  . Drug use: No  . Sexual activity: Yes  Other Topics Concern  . Not on file  Social History Narrative  . Not on file   Social Determinants of Health   Financial Resource Strain:   . Difficulty of Paying Living Expenses: Not on file  Food Insecurity:   . Worried About Charity fundraiser in the Last Year: Not on file  . Ran Out of Food in the Last Year: Not on file  Transportation Needs:   . Lack of Transportation (Medical): Not on file  . Lack of Transportation (Non-Medical): Not on file  Physical Activity:   . Days of Exercise per Week: Not on file  . Minutes of Exercise per Session: Not on file  Stress:   . Feeling of Stress : Not on file  Social  Connections:   . Frequency of Communication with Friends and Family: Not on file  . Frequency of Social Gatherings with Friends and Family: Not on file  . Attends Religious Services: Not on file  . Active Member of Clubs or Organizations: Not on file  . Attends Archivist Meetings: Not on file  . Marital Status: Not on file     Family History: The patient's ***family history includes Diabetes Mellitus II in his mother. There is no history of Colon cancer.  ROS:   All other ROS reviewed and negative. Pertinent positives noted in the HPI.     EKGs/Labs/Other Studies Reviewed:   The following studies were personally reviewed by me today:  EKG:  EKG is *** ordered today.  The ekg ordered today demonstrates ***, and was personally reviewed by me.   TTE 05/06/2019 1. Left ventricular ejection fraction, by visual estimation, is 60 to 65%. The left ventricle has normal function. Normal left ventricular size. Mildly increased left ventricular posterior wall thickness. There is no left ventricular hypertrophy. 2. Left ventricular diastolic Doppler parameters are consistent with impaired relaxation pattern of LV diastolic filling. 3. Global right ventricle has normal systolic function.The right ventricular size is normal. No increase in right ventricular wall thickness. 4. Left atrial size was normal. 5. Right atrial size was normal. 6. The mitral valve is normal in structure. Mild mitral valve regurgitation. No evidence of mitral stenosis. 7. The tricuspid valve is normal in structure. Tricuspid valve regurgitation was not visualized by color flow Doppler. 8. The aortic valve is normal in structure. Aortic valve regurgitation is mild by color flow Doppler. Structurally normal aortic valve, with no evidence of sclerosis or stenosis. 9. The pulmonic valve was normal in structure. Pulmonic valve regurgitation is not visualized by color flow Doppler. 10. The inferior vena cava is  normal in size with greater than 50% respiratory variability, suggesting right atrial pressure of 3 mmHg.  Recent Labs: 05/06/2019: TSH 1.370 05/17/2019: ALT 31 06/25/2019: Magnesium 2.1 06/27/2019: Hemoglobin 8.5; Platelets 233 06/28/2019: B Natriuretic Peptide 306.1 06/29/2019: BUN 25; Creatinine, Ser 2.06; Potassium 4.0; Sodium 137   Recent Lipid Panel    Component Value Date/Time   CHOL 136 05/06/2019 0520   CHOL 158 04/19/2019 1013   TRIG 47 05/06/2019 0520   HDL 53 05/06/2019 0520   HDL 53 04/19/2019 1013   CHOLHDL 2.6 05/06/2019 0520   VLDL 9 05/06/2019 0520   LDLCALC 74 05/06/2019 0520   LDLCALC 87 04/19/2019 1013    Physical Exam:   VS:  There were no vitals taken for this visit.   Wt Readings from Last 3 Encounters:  08/19/19 167 lb (75.8 kg)  07/15/19 166 lb (75.3 kg)  07/02/19 166 lb (75.3 kg)    General: Well nourished, well developed, in no acute distress Heart: Atraumatic, normal size  Eyes: PEERLA, EOMI  Neck: Supple, no JVD Endocrine: No thryomegaly Cardiac: Normal S1, S2; RRR; no murmurs, rubs, or gallops Lungs: Clear to auscultation bilaterally, no wheezing, rhonchi or rales  Abd: Soft, nontender, no hepatomegaly  Ext: No edema, pulses 2+ Musculoskeletal: No deformities, BUE and BLE strength normal and equal Skin: Warm and dry, no rashes   Neuro: Alert and oriented to person, place, time, and situation, CNII-XII grossly intact, no focal deficits  Psych: Normal mood and affect   ASSESSMENT:   [name] is a 69 y.o. male who presents for the following: No diagnosis found.  PLAN:   There are no diagnoses linked to this encounter.  Disposition: No follow-ups on file.  Medication Adjustments/Labs and Tests Ordered: Current medicines are reviewed at length with the patient today.  Concerns regarding medicines are outlined above.  No orders of the defined types were placed in this encounter.  No orders of the defined types were placed in this  encounter.   There are no Patient Instructions on file for this visit.   Signed, Addison Naegeli. Audie Box, [facility]  [address] [place], [place] [address] [phone]  08/21/2019 9:34 PM

## 2019-08-21 NOTE — Telephone Encounter (Signed)
Pt states he does not have a current home health care agency.

## 2019-08-22 ENCOUNTER — Ambulatory Visit: Payer: Medicare Other | Admitting: Cardiovascular Disease

## 2019-08-23 NOTE — Telephone Encounter (Signed)
[facility] - tiffany Andree Elk states pt's current [employer] group number is currently out of network, [place] or [employer] may take.

## 2019-08-23 NOTE — Telephone Encounter (Signed)
Faxed required form, clinicals and demographics to [facility]. Left message informing pt we were continuing to search for [facility] agency and to continue performing the dressing changes until contacted by [facility].

## 2019-09-03 ENCOUNTER — Ambulatory Visit: Payer: Medicare Other | Admitting: Cardiovascular Disease

## 2019-09-03 ENCOUNTER — Other Ambulatory Visit: Payer: Self-pay

## 2019-09-03 ENCOUNTER — Encounter: Payer: Self-pay | Admitting: Sports Medicine

## 2019-09-03 ENCOUNTER — Ambulatory Visit (INDEPENDENT_AMBULATORY_CARE_PROVIDER_SITE_OTHER): Payer: Medicare Other | Admitting: Sports Medicine

## 2019-09-03 VITALS — Temp 98.8°F

## 2019-09-03 DIAGNOSIS — E11610 Type 2 diabetes mellitus with diabetic neuropathic arthropathy: Secondary | ICD-10-CM

## 2019-09-03 DIAGNOSIS — T148XXA Other injury of unspecified body region, initial encounter: Secondary | ICD-10-CM

## 2019-09-03 DIAGNOSIS — E11621 Type 2 diabetes mellitus with foot ulcer: Secondary | ICD-10-CM

## 2019-09-03 DIAGNOSIS — I739 Peripheral vascular disease, unspecified: Secondary | ICD-10-CM

## 2019-09-03 DIAGNOSIS — L97422 Non-pressure chronic ulcer of left heel and midfoot with fat layer exposed: Secondary | ICD-10-CM | POA: Diagnosis not present

## 2019-09-03 MED ORDER — SULFAMETHOXAZOLE-TRIMETHOPRIM 400-80 MG PO TABS: 1.0000 | ORAL_TABLET | Freq: Two times a day (BID) | ORAL | 0 refills | Status: DC

## 2019-09-03 NOTE — Progress Notes (Signed)
Subjective: [name] is a 69 y.o. male patient seen in office for follow-up evaluation of ulceration of the left foot.  Patient reports that he has home nurse help with changing dressing 3 times per week.  Last A1c was recorded at 1 and last blood sugar not recorded like previous. Admits so pain on lateral side of left foot that is slowly getting better.  Patient denies nausea/fever/vomiting/chills/night sweats/shortness of breath. Patient has no other pedal complaints at this time.  Patient Active Problem List   Diagnosis Date Noted  . Dyslipidemia 08/19/2019  . Uncontrolled type 2 diabetes mellitus with hyperglycemia ([place])   . Hyperkalemia   . Acute on chronic diastolic CHF (congestive heart failure), NYHA class 3 ([place])   . Heart block AV complete ([place])   . Acute renal failure with acute tubular necrosis superimposed on stage 4 chronic kidney disease ([place])   . H/O small bowel obstruction 05/17/2019  . PVD (peripheral vascular disease) ([place]) 05/14/2019  . Ulcer of left foot due to type 2 diabetes mellitus ([place])   . Skin ulcer of left foot, limited to breakdown of skin ([place])   . Charcot foot due to diabetes mellitus ([place])   . Charcot's joint of foot, left   . SBO (small bowel obstruction) ([place]) 05/06/2019  . Complete heart block ([place]) 05/06/2019  . Moderate protein-calorie malnutrition ([place])   . Diabetic foot ulcer ([place]) 10/19/2018  . Diabetic foot infection ([place])   . Pseudophakia, both eyes 10/04/2018  . Stage 3 chronic kidney disease 09/27/2018  . Adrenal insufficiency ([place]) 04/09/2015  . Peripheral edema 09/17/2014  . Shortness of breath 09/17/2014  . Bronchospasm, acute 09/16/2014  . Partial small bowel obstruction ([place]) 09/16/2014  . Hyperglycemia   . Diabetic retinopathy associated with type 2 diabetes mellitus ([place]) 09/13/2014  . Abdominal pain 09/13/2014  . Cough 11/15/2012  . Pharyngitis 09/14/2012  . Fluid overload 09/03/2012  . Ileus following gastrointestinal  surgery ([place]) 09/03/2012  . Hypokalemia 08/30/2012  . Small bowel obstruction s/p LOA [id number] 08/21/2012  . Abnormal EKG 08/21/2012  . Chest pain 08/21/2012  . Macular degeneration (senile) of retina 12/15/2011  . Lens replaced 12/15/2011  . Vitreous hemorrhage ([place]) 12/15/2011  . Essential hypertension 08/19/2008  . Regional enteritis of small intestine ([place]) 01/03/2008  . ERECTILE DYSFUNCTION, ORGANIC 01/03/2008  . Sarcoidosis 07/19/2007  . Type 2 diabetes, uncontrolled, with retinopathy ([place]) 07/19/2007  . HYPERLIPIDEMIA, MIXED 07/19/2007  . CERUMEN IMPACTION, BILATERAL 07/19/2007  . URINARY INCONTINENCE, STRESS, MALE 08/17/2006  . PROSTATE CANCER, HX OF 08/17/2006  . DIABETIC  RETINOPATHY 01/09/2002   Current Outpatient Medications on File Prior to Visit  Medication Sig Dispense Refill  . acetaminophen (TYLENOL) 650 MG CR tablet Take 650 mg by mouth every 8 (eight) hours as needed for pain.    [name] amLODipine (NORVASC) 10 MG tablet Take 1 tablet (10 mg total) by mouth daily. 90 tablet 3  . aspirin 81 MG chewable tablet Chew 81 mg by mouth every morning.     [name] atorvastatin (LIPITOR) 20 MG tablet Take 1 tablet (20 mg total) by mouth every morning. 90 tablet 3  . blood glucose meter kit and supplies KIT Dispense based on patient and insurance preference. Use up to four times daily as directed. (FOR ICD-9 250.00, 250.01). 1 each 1  . clopidogrel (PLAVIX) 75 MG tablet Take 1 tablet (75 mg total) by mouth daily with breakfast. 30 tablet 3  . docusate sodium (COLACE) 100 MG capsule Take 100  mg by mouth daily as needed for mild constipation.    . gabapentin (NEURONTIN) 300 MG capsule TAKE 1 CAPSULE(300 MG) BY MOUTH THREE TIMES DAILY (Patient taking differently: Take 300 mg by mouth 2 (two) times daily. Take 300 mg by mouth in the morning and 300 mg at bedtime- may take an additional 300 mg once a day as needed/as directed) 90 capsule 0  . guaiFENesin (MUCINEX) 600 MG 12 hr tablet Take 600 mg by  mouth 2 (two) times daily as needed for to loosen phlegm.    . Insulin Isophane & Regular Human (HUMULIN 70/30 MIX) (70-30) 100 UNIT/ML PEN Inject 16-20 Units into the skin daily before breakfast AND 16-20 Units daily before supper. 15 mL   . Insulin Syringe-Needle U-100 (BD INSULIN SYRINGE ULTRAFINE) 31G X 5/16" 0.5 ML MISC Use 2x a day for insulin 100 each 5  . Lancets (FREESTYLE) lancets Use as instructed 100 each 0  . Lancets 30G MISC     . Multiple Vitamins-Minerals (CENTRUM SILVER 50+MEN) TABS Take 1 tablet by mouth daily with breakfast.    . mupirocin ointment (BACTROBAN) 2 % Apply to left foot once daily 22 g 0  . Polyethyl Glycol-Propyl Glycol (SYSTANE OP) Place 1 drop into both eyes as needed (for dryness).     . torsemide (DEMADEX) 20 MG tablet Take 2 tablets (40 mg total) by mouth every other day. 180 tablet 3  . Wound Dressings (MEDIHONEY WOUND/BURN DRESSING) GEL Apply to affected are 3 times a week, and cover with sterile dressing. 30 mL 1   No current facility-administered medications on file prior to visit.   Allergies  Allergen Reactions  . Ace Inhibitors Itching and Cough  . Naproxen Anaphylaxis, Shortness Of Breath and Other (See Comments)    Throat swells. cannot breathe, and causes GI distress      Objective: There were no vitals filed for this visit.  General: Patient is awake, alert, oriented x 3 and in no acute distress.  Dermatology: Skin is warm and dry bilateral with a blister with bogginess and drainage at plantar left lateral midfoot with mild reactive keratosis after debrided revealed at a opening that measures 1x0.5cm plantarlly with a decreased keratotic with underlying macerated border. There is no malodor, minimal bloody active drainage, no erythema, no edema. No other acute signs of infection.   Vascular: Dorsalis Pedis pulse = 1/4 Bilateral,  Posterior Tibial pulse = 1/4 Bilateral,  Capillary Fill Time < 5 seconds  Neurologic: Protective sensation  diminished bilateral.  Musculosketal: There is Charcot deformity noted on left foot.  Mild pain to previous ulceration at lateral aspect on left.   dNo results for input(s): GRAMSTAIN, LABORGA in the last 8760 hours.  Assessment and Plan:  Problem List Items Addressed This Visit      Endocrine   Diabetic foot ulcer ([place]) - Primary   Charcot foot due to diabetes mellitus ([place])    Other Visit Diagnoses    PAD (peripheral artery disease) ([place])       Blood blister         -Examined patient and re-discussed the progression of the wound and treatment alternatives. -Mechanically debrided wound plantar forefoot using sterile chisel blade revealing macerated skin and bleeding dermis.  -Refilled Bactrim since patient reports that he did not pick up the antibiotic -Applied Medihoney to wound and advised patient to continue with home nursing changing the dressing 3 times per week -Continue with postop shoe with Pegasys Plastizote and offloading padding -  Continue with vascular follow-up patient is status post angiogram - Advised patient to go to the ER or return to office if the wound worsens or if constitutional symptoms are present. -Advised walker or cane or electric wheelchair to help keep pressure off ulceration like before and to limit activity until he can get his Milinda Cave boot once his wound is healed -Patient to return to the office in 2 weeks for wound care Landis Martins, DPM

## 2019-09-05 ENCOUNTER — Ambulatory Visit: Payer: Medicare Other | Admitting: Family Medicine

## 2019-09-05 ENCOUNTER — Telehealth: Payer: Self-pay | Admitting: Family Medicine

## 2019-09-05 NOTE — Telephone Encounter (Signed)
Pt called in to say they were not able to make appt today  Mother has been hospitalized they are on their way out of town

## 2019-09-06 ENCOUNTER — Ambulatory Visit: Payer: Medicare Other | Admitting: Cardiovascular Disease

## 2019-09-06 NOTE — Progress Notes (Deleted)
Cardiology Office Note:   Date:  09/06/2019  NAME:  [name]    MRN: [id number] DOB:  [birth year]   PCP:  [name], MD  Cardiologist:  Evalina Field, MD  Electrophysiologist:  None   Referring MD: [name], MD   No chief complaint on file. ***  History of Present Illness:   [name] is a 69 y.o. male with a hx of HFpEF, PAD, pulmonary sarcoidosis, CKD 4, first-degree block who presents for follow-up.   Problem List 1. HFpEF, 55-60% -admit 06/25/2019-06/29/2019 2. PAD s/p L anterior tibial angioplasty 3. Pulmonary sarcoidosis  4. CKD IV 5. 1AVB (350 ms) -concerns for CHB 04/2019 but likely Wenkebach as this was in setting of bowel obstruction    Past Medical History: Past Medical History:  Diagnosis Date  . Cellulitis and abscess of face 10/11/2007   Qualifier: Diagnosis of  By: Amil Amen MD, Benjamine Mola    . Diabetic retinopathy ([place])   . Diabetic retinopathy associated with type 2 diabetes mellitus ([place]) 09/13/2014   He works for [employer]  . Fluid overload 09/03/2012   Post op  . GERD (gastroesophageal reflux disease)   . Visual impairment     Past Surgical History: Past Surgical History:  Procedure Laterality Date  . ABDOMINAL AORTOGRAM W/LOWER EXTREMITY Left 05/30/2019   Procedure: ABDOMINAL AORTOGRAM W/LOWER EXTREMITY;  Surgeon: Marty Heck, MD;  Location: [facility];  Service: Cardiovascular;  Laterality: Left;  . CATARACT EXTRACTION W/ INTRAOCULAR LENS  IMPLANT, BILATERAL    . EYE SURGERY Bilateral    "laser OR for diabetic retinopathy"  . INGUINAL HERNIA REPAIR     [name] 07/12/2010), "don't remember which side"  . LAPAROTOMY  08/23/2012   Procedure: EXPLORATORY LAPAROTOMY;  Surgeon: [name], DO;  Location: [facility];  Service: General;  Laterality: N/A;  exploratory laparotomy with lysis of adhesions  . LYSIS OF ADHESION  08/23/2012   Procedure: LYSIS OF ADHESION;  Surgeon: [name], DO;   Location: [facility];  Service: General;;  . PERIPHERAL VASCULAR BALLOON ANGIOPLASTY  05/30/2019   Procedure: PERIPHERAL VASCULAR BALLOON ANGIOPLASTY;  Surgeon: Marty Heck, MD;  Location: [facility];  Service: Cardiovascular;;  left anterior tibial  . ROBOT ASSISTED LAPAROSCOPIC RADICAL PROSTATECTOMY  2000's   "had to finish manually after machine broke"  . SHOULDER SURGERY  1970's   separation; from playing football"    Current Medications: No outpatient medications have been marked as taking for the 09/06/19 encounter (Appointment) with O'Neal, Cassie Freer, MD.     Allergies:    Ace inhibitors and Naproxen   Social History: Social History   Socioeconomic History  . Marital status: Married    Spouse name: Not on file  . Number of children: Not on file  . Years of education: Not on file  . Highest education level: Not on file  Occupational History  . Not on file  Tobacco Use  . Smoking status: Former Smoker    Packs/day: 2.50    Years: 3.00    Pack years: 7.50    Types: Cigarettes    Quit date: 11/09/1966    Years since quitting: 52.8  . Smokeless tobacco: Never Used  Substance and Sexual Activity  . Alcohol use: Yes    Comment: 04/08/2015 "maybe a beer/ or 2 or a glass of wine monthly"  . Drug use: No  . Sexual activity: Yes  Other Topics Concern  . Not  on file  Social History Narrative  . Not on file   Social Determinants of Health   Financial Resource Strain:   . Difficulty of Paying Living Expenses: Not on file  Food Insecurity:   . Worried About Charity fundraiser in the Last Year: Not on file  . Ran Out of Food in the Last Year: Not on file  Transportation Needs:   . Lack of Transportation (Medical): Not on file  . Lack of Transportation (Non-Medical): Not on file  Physical Activity:   . Days of Exercise per Week: Not on file  . Minutes of Exercise per Session: Not on file  Stress:   . Feeling of Stress : Not on file  Social  Connections:   . Frequency of Communication with Friends and Family: Not on file  . Frequency of Social Gatherings with Friends and Family: Not on file  . Attends Religious Services: Not on file  . Active Member of Clubs or Organizations: Not on file  . Attends Archivist Meetings: Not on file  . Marital Status: Not on file     Family History: The patient's ***family history includes Diabetes Mellitus II in his mother. There is no history of Colon cancer.  ROS:   All other ROS reviewed and negative. Pertinent positives noted in the HPI.     EKGs/Labs/Other Studies Reviewed:   The following studies were personally reviewed by me today:  EKG:  EKG is *** ordered today.  The ekg ordered today demonstrates ***, and was personally reviewed by me.  TTE 05/06/2019  1. Left ventricular ejection fraction, by visual estimation, is 60 to 65%. The left ventricle has normal function. Normal left ventricular size. Mildly increased left ventricular posterior wall thickness. There is no left ventricular hypertrophy.  2. Left ventricular diastolic Doppler parameters are consistent with impaired relaxation pattern of LV diastolic filling.  3. Global right ventricle has normal systolic function.The right ventricular size is normal. No increase in right ventricular wall thickness.  4. Left atrial size was normal.  5. Right atrial size was normal.  6. The mitral valve is normal in structure. Mild mitral valve regurgitation. No evidence of mitral stenosis.  7. The tricuspid valve is normal in structure. Tricuspid valve regurgitation was not visualized by color flow Doppler.  8. The aortic valve is normal in structure. Aortic valve regurgitation is mild by color flow Doppler. Structurally normal aortic valve, with no evidence of sclerosis or stenosis.  9. The pulmonic valve was normal in structure. Pulmonic valve regurgitation is not visualized by color flow Doppler. 10. The inferior vena cava is  normal in size with greater than 50% respiratory variability, suggesting right atrial pressure of 3 mmHg.  NM Stress 07/15/2019  The left ventricular ejection fraction is normal (55-65%).  Nuclear stress EF: 58%. No wall motion abnormalities noted  There was no ST segment deviation noted during stress.  No significant perfusion defects noted. Overall low risk study.  This is a low risk study.      Recent Labs: 05/06/2019: TSH 1.370 05/17/2019: ALT 31 06/25/2019: Magnesium 2.1 06/27/2019: Hemoglobin 8.5; Platelets 233 06/28/2019: B Natriuretic Peptide 306.1 06/29/2019: BUN 25; Creatinine, Ser 2.06; Potassium 4.0; Sodium 137   Recent Lipid Panel    Component Value Date/Time   CHOL 136 05/06/2019 0520   CHOL 158 04/19/2019 1013   TRIG 47 05/06/2019 0520   HDL 53 05/06/2019 0520   HDL 53 04/19/2019 1013   CHOLHDL 2.6 05/06/2019 0520  VLDL 9 05/06/2019 0520   LDLCALC 74 05/06/2019 0520   LDLCALC 87 04/19/2019 1013    Physical Exam:   VS:  There were no vitals taken for this visit.   Wt Readings from Last 3 Encounters:  08/19/19 167 lb (75.8 kg)  07/15/19 166 lb (75.3 kg)  07/02/19 166 lb (75.3 kg)    General: Well nourished, well developed, in no acute distress Heart: Atraumatic, normal size  Eyes: PEERLA, EOMI  Neck: Supple, no JVD Endocrine: No thryomegaly Cardiac: Normal S1, S2; RRR; no murmurs, rubs, or gallops Lungs: Clear to auscultation bilaterally, no wheezing, rhonchi or rales  Abd: Soft, nontender, no hepatomegaly  Ext: No edema, pulses 2+ Musculoskeletal: No deformities, BUE and BLE strength normal and equal Skin: Warm and dry, no rashes   Neuro: Alert and oriented to person, place, time, and situation, CNII-XII grossly intact, no focal deficits  Psych: Normal mood and affect   ASSESSMENT:   [name] is a 69 y.o. male who presents for the following: No diagnosis found.  PLAN:   There are no diagnoses linked to this  encounter.  Disposition: No follow-ups on file.  Medication Adjustments/Labs and Tests Ordered: Current medicines are reviewed at length with the patient today.  Concerns regarding medicines are outlined above.  No orders of the defined types were placed in this encounter.  No orders of the defined types were placed in this encounter.   There are no Patient Instructions on file for this visit.   Signed, Addison Naegeli. Audie Box, [place]  [address], [place], [place] [address] [phone]  09/06/2019 9:15 AM

## 2019-09-09 NOTE — Telephone Encounter (Signed)
FYI to provider

## 2019-09-10 ENCOUNTER — Telehealth: Payer: Self-pay

## 2019-09-10 MED ORDER — BLOOD GLUCOSE MONITOR KIT: PACK | 1 refills | Status: AC

## 2019-09-10 MED ORDER — RELION CONFIRM GLUCOSE MONITOR W/DEVICE KIT: PACK | 0 refills | Status: AC

## 2019-09-10 MED ORDER — BLOOD GLUCOSE MONITOR KIT: PACK | 1 refills | Status: DC

## 2019-09-10 NOTE — Telephone Encounter (Signed)
[name] from [facility] calling to request meter Rx be sent in for this patient he has misplaced it

## 2019-09-10 NOTE — Addendum Note (Signed)
Addended by: Cardell Peach I on: 09/10/2019 11:48 AM   Modules accepted: Orders

## 2019-09-10 NOTE — Telephone Encounter (Signed)
New meter sent 

## 2019-09-17 ENCOUNTER — Other Ambulatory Visit: Payer: Self-pay

## 2019-09-17 ENCOUNTER — Ambulatory Visit (INDEPENDENT_AMBULATORY_CARE_PROVIDER_SITE_OTHER): Payer: Medicare Other | Admitting: Sports Medicine

## 2019-09-17 ENCOUNTER — Encounter: Payer: Self-pay | Admitting: Sports Medicine

## 2019-09-17 VITALS — Temp 97.8°F

## 2019-09-17 DIAGNOSIS — L97422 Non-pressure chronic ulcer of left heel and midfoot with fat layer exposed: Secondary | ICD-10-CM

## 2019-09-17 DIAGNOSIS — E11610 Type 2 diabetes mellitus with diabetic neuropathic arthropathy: Secondary | ICD-10-CM

## 2019-09-17 DIAGNOSIS — E11621 Type 2 diabetes mellitus with foot ulcer: Secondary | ICD-10-CM | POA: Diagnosis not present

## 2019-09-17 DIAGNOSIS — I739 Peripheral vascular disease, unspecified: Secondary | ICD-10-CM

## 2019-09-17 NOTE — Progress Notes (Signed)
Subjective: [name] is a 69 y.o. male patient seen in office for follow-up evaluation of ulceration of the left foot.  Patient reports that he has home nurse help with changing dressing 3 times per week like before he just came out yesterday.  Last A1c and blood sugar was not recorded like previous. Admits so pain on lateral side of left foot that is slowly getting better but still sometimes is very sore and touchy with some bleeding.  Reports that he picked up his Bactrim antibiotics without any issues.  Patient denies nausea/fever/vomiting/chills/night sweats/shortness of breath. Patient has no other pedal complaints at this time.  Patient Active Problem List   Diagnosis Date Noted  . Dyslipidemia 08/19/2019  . Uncontrolled type 2 diabetes mellitus with hyperglycemia ([facility])   . Hyperkalemia   . Acute on chronic diastolic CHF (congestive heart failure), NYHA class 3 ([place])   . Heart block AV complete ([place])   . Acute renal failure with acute tubular necrosis superimposed on stage 4 chronic kidney disease ([place])   . Optic atrophy of both eyes 06/13/2019  . H/O small bowel obstruction 05/17/2019  . PVD (peripheral vascular disease) ([place]) 05/14/2019  . Ulcer of left foot due to type 2 diabetes mellitus ([place])   . Skin ulcer of left foot, limited to breakdown of skin ([place])   . Charcot foot due to diabetes mellitus ([place])   . Charcot's joint of foot, left   . SBO (small bowel obstruction) ([place]) 05/06/2019  . Complete heart block ([place]) 05/06/2019  . Moderate protein-calorie malnutrition ([place])   . Diabetic foot ulcer ([place]) 10/19/2018  . Diabetic foot infection ([place])   . Pseudophakia, both eyes 10/04/2018  . Stage 3 chronic kidney disease 09/27/2018  . Adrenal insufficiency ([place]) 04/09/2015  . Peripheral edema 09/17/2014  . Shortness of breath 09/17/2014  . Bronchospasm, acute 09/16/2014  . Partial small bowel obstruction ([place]) 09/16/2014  . Hyperglycemia   . Diabetic retinopathy  associated with type 2 diabetes mellitus ([place]) 09/13/2014  . Abdominal pain 09/13/2014  . Cough 11/15/2012  . Pharyngitis 09/14/2012  . Fluid overload 09/03/2012  . Ileus following gastrointestinal surgery ([place]) 09/03/2012  . Hypokalemia 08/30/2012  . Small bowel obstruction s/p LOA [id number] 08/21/2012  . Abnormal EKG 08/21/2012  . Chest pain 08/21/2012  . Macular degeneration (senile) of retina 12/15/2011  . Lens replaced 12/15/2011  . Vitreous hemorrhage ([place]) 12/15/2011  . Essential hypertension 08/19/2008  . Regional enteritis of small intestine ([place]) 01/03/2008  . ERECTILE DYSFUNCTION, ORGANIC 01/03/2008  . Sarcoidosis 07/19/2007  . Type 2 diabetes, uncontrolled, with retinopathy ([place]) 07/19/2007  . HYPERLIPIDEMIA, MIXED 07/19/2007  . CERUMEN IMPACTION, BILATERAL 07/19/2007  . URINARY INCONTINENCE, STRESS, MALE 08/17/2006  . PROSTATE CANCER, HX OF 08/17/2006  . DIABETIC  RETINOPATHY 01/09/2002   Current Outpatient Medications on File Prior to Visit  Medication Sig Dispense Refill  . acetaminophen (TYLENOL) 650 MG CR tablet Take 650 mg by mouth every 8 (eight) hours as needed for pain.    [name] amLODipine (NORVASC) 10 MG tablet Take 1 tablet (10 mg total) by mouth daily. 90 tablet 3  . aspirin 81 MG chewable tablet Chew 81 mg by mouth every morning.     [name] atorvastatin (LIPITOR) 20 MG tablet Take 1 tablet (20 mg total) by mouth every morning. 90 tablet 3  . blood glucose meter kit and supplies KIT Dispense based on patient and insurance preference. Use up to four times daily as directed. 1 each 1  .  Blood Glucose Monitoring Suppl (RELION CONFIRM GLUCOSE MONITOR) w/Device KIT Use to check blood sugar 3 times a day. 1 kit 0  . clopidogrel (PLAVIX) 75 MG tablet Take 1 tablet (75 mg total) by mouth daily with breakfast. 30 tablet 3  . docusate sodium (COLACE) 100 MG capsule Take 100 mg by mouth daily as needed for mild constipation.    . gabapentin (NEURONTIN) 300 MG capsule TAKE 1  CAPSULE(300 MG) BY MOUTH THREE TIMES DAILY (Patient taking differently: Take 300 mg by mouth 2 (two) times daily. Take 300 mg by mouth in the morning and 300 mg at bedtime- may take an additional 300 mg once a day as needed/as directed) 90 capsule 0  . guaiFENesin (MUCINEX) 600 MG 12 hr tablet Take 600 mg by mouth 2 (two) times daily as needed for to loosen phlegm.    . Insulin Isophane & Regular Human (HUMULIN 70/30 MIX) (70-30) 100 UNIT/ML PEN Inject 16-20 Units into the skin daily before breakfast AND 16-20 Units daily before supper. 15 mL   . Insulin Syringe-Needle U-100 (BD INSULIN SYRINGE ULTRAFINE) 31G X 5/16" 0.5 ML MISC Use 2x a day for insulin 100 each 5  . Lancets (FREESTYLE) lancets Use as instructed 100 each 0  . Lancets 30G MISC     . Multiple Vitamins-Minerals (CENTRUM SILVER 50+MEN) TABS Take 1 tablet by mouth daily with breakfast.    . mupirocin ointment (BACTROBAN) 2 % Apply to left foot once daily 22 g 0  . Polyethyl Glycol-Propyl Glycol (SYSTANE OP) Place 1 drop into both eyes as needed (for dryness).     [name] sulfamethoxazole-trimethoprim (BACTRIM) 400-80 MG tablet Take 1 tablet by mouth 2 (two) times daily. 28 tablet 0  . torsemide (DEMADEX) 20 MG tablet Take 2 tablets (40 mg total) by mouth every other day. 180 tablet 3  . Wound Dressings (MEDIHONEY WOUND/BURN DRESSING) GEL Apply to affected are 3 times a week, and cover with sterile dressing. 30 mL 1   No current facility-administered medications on file prior to visit.   Allergies  Allergen Reactions  . Ace Inhibitors Itching and Cough  . Naproxen Anaphylaxis, Shortness Of Breath and Other (See Comments)    Throat swells. cannot breathe, and causes GI distress      Objective: There were no vitals filed for this visit.  General: Patient is awake, alert, oriented x 3 and in no acute distress.  Dermatology: Skin is warm and dry bilateral with a blister with bogginess and drainage at plantar left lateral midfoot with  mild reactive keratosis after debrided revealed at a opening that measures 0.5x0.5cm plantarlly and at the lateral ankle there is a wound that measures 0.5 x 1 cm with eschar base and with a decreased keratotic with underlying macerated border surrounding both wounds. There is no malodor, minimal bloody active drainage, no erythema, no edema. No other acute signs of infection.   Vascular: Dorsalis Pedis pulse = 1/4 Bilateral,  Posterior Tibial pulse = 1/4 Bilateral,  Capillary Fill Time < 5 seconds  Neurologic: Protective sensation diminished bilateral.  Musculosketal: There is Charcot deformity noted on left foot.  Mild pain to previous ulceration at lateral aspect on left.   dNo results for input(s): GRAMSTAIN, LABORGA in the last 8760 hours.  Assessment and Plan:  Problem List Items Addressed This Visit      Endocrine   Diabetic foot ulcer ([place]) - Primary   Charcot foot due to diabetes mellitus ([place])    Other Visit Diagnoses  PAD (peripheral artery disease) ([place])         -Examined patient and re-discussed the progression of the wound and treatment alternatives. -Mechanically debrided wound plantar forefoot using sterile chisel blade revealing macerated skin and bleeding dermis.  -Continue with Bactrim until completed -Applied Medihoney to wound and advised patient to continue with home nursing changing the dressing 3 times per week -Continue with postop shoe with Pegasys Plastizote and offloading padding -Continue with vascular follow-up patient is status post angiogram - Advised patient to go to the ER or return to office if the wound worsens or if constitutional symptoms are present. -Advised walker or cane or electric wheelchair to help keep pressure off ulceration like before and to limit activity until he can get his Milinda Cave boot once his wound is healed like before -Patient to return to the office in 2 weeks for wound care.  Advised patient if wound still fails to heal may benefit  from referral to wound care center. Landis Martins, DPM

## 2019-09-24 NOTE — Progress Notes (Signed)
Cardiology Office Note:   Date:  09/25/2019  NAME:  [name]    MRN: [id number] DOB:  May 16, 1951   PCP:  [name], MD  Cardiologist:  Evalina Field, MD  Electrophysiologist:  None   Referring MD: [name], MD   Chief Complaint  Patient presents with  . Congestive Heart Failure    History of Present Illness:   [name] is a 69 y.o. male with a hx of HFpEF, PAD, CKD, diabetes who presents for follow-up of HFpEF.  He reports has been doing well.  However, he has been out of medications.  Apparently there are financial issues here.  He is working with the Chemical engineer to see if they can help get medications.  His most recent A1c is 13.0 which is not controlled.  He has a chronic left lower extremity diabetic foot ulcer that is not healing.  He reports has been out of his torsemide as well.  He does have 1+ lower extremity edema but he reports no shortness of breath.  He reports that he can climb stairs with some difficulty but this is mainly due to the left lower extremity.  His blood pressure is not that bad today on no medication.  He apparently has also stopped his Plavix.  I informed him given his recent lower extremity angioplasty he should be on aspirin and Plavix.  He is also on atorvastatin.  He denies any chest pain, shortness of breath, worsening heart failure symptoms today.  Regarding his heart rate.  His EKG today demonstrates significant first-degree AV block around 350 ms.  He reports no lightheadedness or dizziness with doing activities.  He has no difficulties getting up and moving around.  He appears to be asymptomatic from this.  Problem List 1. HFpEF 2. PAD -s/p L anterior tibial angioplasty 05/30/2019 3. Pulmonary sarcoidosis  4. CKD  -baseline Cr ~1.9 5. Hypertension  6. Diabetes  -A1c 13.0 7. 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 HLD -T chol 136, HDL 53, LDL 74, TG 47  Past Medical History: Past Medical  History:  Diagnosis Date  . Cellulitis and abscess of face 10/11/2007   Qualifier: Diagnosis of  By: Amil Amen MD, Benjamine Mola    . Diabetic retinopathy ([place])   . Diabetic retinopathy associated with type 2 diabetes mellitus ([place]) 09/13/2014   He works for [employer]  . Fluid overload 09/03/2012   Post op  . GERD (gastroesophageal reflux disease)   . Visual impairment     Past Surgical History: Past Surgical History:  Procedure Laterality Date  . ABDOMINAL AORTOGRAM W/LOWER EXTREMITY Left 05/30/2019   Procedure: ABDOMINAL AORTOGRAM W/LOWER EXTREMITY;  Surgeon: Marty Heck, MD;  Location: [facility];  Service: Cardiovascular;  Laterality: Left;  . CATARACT EXTRACTION W/ INTRAOCULAR LENS  IMPLANT, BILATERAL    . EYE SURGERY Bilateral    "laser OR for diabetic retinopathy"  . INGUINAL HERNIA REPAIR     [name] 07/12/2010), "don't remember which side"  . LAPAROTOMY  08/23/2012   Procedure: EXPLORATORY LAPAROTOMY;  Surgeon: [name], DO;  Location: [facility];  Service: General;  Laterality: N/A;  exploratory laparotomy with lysis of adhesions  . LYSIS OF ADHESION  08/23/2012   Procedure: LYSIS OF ADHESION;  Surgeon: [name], DO;  Location: [facility];  Service: General;;  . PERIPHERAL VASCULAR BALLOON ANGIOPLASTY  05/30/2019   Procedure: PERIPHERAL VASCULAR BALLOON ANGIOPLASTY;  Surgeon: Marty Heck, MD;  Location: [facility];  Service: Cardiovascular;;  left anterior tibial  . ROBOT ASSISTED LAPAROSCOPIC RADICAL PROSTATECTOMY  2000's   "had to finish manually after machine broke"  . SHOULDER SURGERY  1970's   separation; from playing football"    Current Medications: Current Meds  Medication Sig  . amLODipine (NORVASC) 10 MG tablet Take 1 tablet (10 mg total) by mouth daily.  [name] aspirin 81 MG chewable tablet Chew 1 tablet (81 mg total) by mouth every morning.  . [DISCONTINUED] amLODipine (NORVASC) 10 MG tablet Take 1 tablet (10 mg total) by  mouth daily.  . [DISCONTINUED] aspirin 81 MG chewable tablet Chew 81 mg by mouth every morning.      Allergies:    Ace inhibitors and Naproxen   Social History: Social History   Socioeconomic History  . Marital status: Married    Spouse name: Not on file  . Number of children: Not on file  . Years of education: Not on file  . Highest education level: Not on file  Occupational History  . Not on file  Tobacco Use  . Smoking status: Former Smoker    Packs/day: 2.50    Years: 3.00    Pack years: 7.50    Types: Cigarettes    Quit date: 11/09/1966    Years since quitting: 52.9  . Smokeless tobacco: Never Used  Substance and Sexual Activity  . Alcohol use: Yes    Comment: 04/08/2015 "maybe a beer/ or 2 or a glass of wine monthly"  . Drug use: No  . Sexual activity: Yes  Other Topics Concern  . Not on file  Social History Narrative  . Not on file   Social Determinants of Health   Financial Resource Strain:   . Difficulty of Paying Living Expenses: Not on file  Food Insecurity:   . Worried About Charity fundraiser in the Last Year: Not on file  . Ran Out of Food in the Last Year: Not on file  Transportation Needs:   . Lack of Transportation (Medical): Not on file  . Lack of Transportation (Non-Medical): Not on file  Physical Activity:   . Days of Exercise per Week: Not on file  . Minutes of Exercise per Session: Not on file  Stress:   . Feeling of Stress : Not on file  Social Connections:   . Frequency of Communication with Friends and Family: Not on file  . Frequency of Social Gatherings with Friends and Family: Not on file  . Attends Religious Services: Not on file  . Active Member of Clubs or Organizations: Not on file  . Attends Archivist Meetings: Not on file  . Marital Status: Not on file     Family History: The patient's family history includes Diabetes Mellitus II in his mother. There is no history of Colon cancer.  ROS:   All other ROS  reviewed and negative. Pertinent positives noted in the HPI.     EKGs/Labs/Other Studies Reviewed:   The following studies were personally reviewed by me today:  EKG:  EKG is ordered today.  The ekg ordered today demonstrates normal sinus rhythm, heart rate 74, first-degree AV block, 348 ms, LVH with repolarization abnormality, and was personally reviewed by me.   NM MPI 07/15/2019 - normal  TTE 05/06/2019 1. Left ventricular ejection fraction, by visual estimation, is 60 to  65%. The left ventricle has normal function. Normal left ventricular size.  Mildly increased left ventricular posterior wall thickness.  There is no  left ventricular hypertrophy.  2. Left ventricular diastolic Doppler parameters are consistent with  impaired relaxation pattern of LV diastolic filling.  3. Global right ventricle has normal systolic function.The right  ventricular size is normal. No increase in right ventricular wall  thickness.  4. Left atrial size was normal.  5. Right atrial size was normal.  6. The mitral valve is normal in structure. Mild mitral valve  regurgitation. No evidence of mitral stenosis.  7. The tricuspid valve is normal in structure. Tricuspid valve  regurgitation was not visualized by color flow Doppler.  8. The aortic valve is normal in structure. Aortic valve regurgitation is  mild by color flow Doppler. Structurally normal aortic valve, with no  evidence of sclerosis or stenosis.  9. The pulmonic valve was normal in structure. Pulmonic valve  regurgitation is not visualized by color flow Doppler.  10. The inferior vena cava is normal in size with greater than 50%  respiratory variability, suggesting right atrial pressure of 3 mmHg.    Recent Labs: 05/06/2019: TSH 1.370 05/17/2019: ALT 31 06/25/2019: Magnesium 2.1 06/27/2019: Hemoglobin 8.5; Platelets 233 06/28/2019: B Natriuretic Peptide 306.1 06/29/2019: BUN 25; Creatinine, Ser 2.06; Potassium 4.0; Sodium 137    Recent Lipid Panel    Component Value Date/Time   CHOL 136 05/06/2019 0520   CHOL 158 04/19/2019 1013   TRIG 47 05/06/2019 0520   HDL 53 05/06/2019 0520   HDL 53 04/19/2019 1013   CHOLHDL 2.6 05/06/2019 0520   VLDL 9 05/06/2019 0520   LDLCALC 74 05/06/2019 0520   LDLCALC 87 04/19/2019 1013    Physical Exam:   VS:  BP 140/68   Pulse 74   Temp (!) 97 F (36.1 C)   Ht 5\' 7"  (1.702 m)   Wt 172 lb 3.2 oz (78.1 kg)   SpO2 97%   BMI 26.97 kg/m    Wt Readings from Last 3 Encounters:  09/25/19 172 lb 3.2 oz (78.1 kg)  08/19/19 167 lb (75.8 kg)  07/15/19 166 lb (75.3 kg)    General: Well nourished, well developed, in no acute distress Heart: Atraumatic, normal size  Eyes: PEERLA, EOMI  Neck: Supple, no JVD Endocrine: No thryomegaly Cardiac: Normal S1, S2; RRR; no murmurs, rubs, or gallops Lungs: Clear to auscultation bilaterally, no wheezing, rhonchi or rales  Abd: Soft, nontender, no hepatomegaly  Ext: Left lower extremity noted to be in a boot, 1+ pitting edema up to the mid shins Musculoskeletal: No deformities, BUE and BLE strength normal and equal Skin: Warm and dry, no rashes   Neuro: Alert and oriented to person, place, time, and situation, CNII-XII grossly intact, no focal deficits  Psych: Normal mood and affect   ASSESSMENT:   [name] is a 69 y.o. male who presents for the following: 1. Chronic heart failure with preserved ejection fraction ([place])   2. Essential hypertension   3. Mixed hyperlipidemia   4. 1st degree AV block   5. Bradycardia     PLAN:   1. Chronic heart failure with preserved ejection fraction (HCC) -Recent nuclear medicine stress test negative.  Suspect this is related to diabetes, hypertension, CKD. -We will represcribe his amlodipine 10 mg daily -We will restart his torsemide 50 mg daily.  He does have evidence of lower extremity edema.  He has not been taking his torsemide.  I counseled him extensively on diet and exercise.  He  will reduce his salt intake and continue to take his  medications.  2. Essential hypertension -Restart amlodipine.  Blood pressure goal less than 130/80  3. Mixed hyperlipidemia -Due to CKD, PAD, diabetes we will continue his Lipitor 20 mg daily.  Most recent LDL 74.  4. 1st degree AV block -Rather impressive first-degree AV block around 350 ms.  He was admitted in September and there was concerns for complete heart block.  I suspect this was winky block.  That rhythm was irregular.  He really has no symptoms from this.  There is no symptoms suggest chronotropic incompetence or worsening conduction disease.  We will avoid any AV nodal agents moving forward.  Given his lack of symptoms, we will continue to monitor this.  I did order a cardiac monitor but he has been unable to get this due to financial reasons.  We will hold for now.  5. Bradycardia -Asymptomatic.  Continue to monitor.  Disposition: Return in about 2 months (around 11/23/2019).  Medication Adjustments/Labs and Tests Ordered: Current medicines are reviewed at length with the patient today.  Concerns regarding medicines are outlined above.  Orders Placed This Encounter  Procedures  . EKG 12-Lead   Meds ordered this encounter  Medications  . aspirin 81 MG chewable tablet    Sig: Chew 1 tablet (81 mg total) by mouth every morning.    Dispense:  90 tablet    Refill:  3  . amLODipine (NORVASC) 10 MG tablet    Sig: Take 1 tablet (10 mg total) by mouth daily.    Dispense:  90 tablet    Refill:  3  . clopidogrel (PLAVIX) 75 MG tablet    Sig: Take 1 tablet (75 mg total) by mouth daily with breakfast.    Dispense:  30 tablet    Refill:  3  . torsemide (DEMADEX) 100 MG tablet    Sig: Take 0.5 tablets (50 mg total) by mouth daily.    Dispense:  60 tablet    Refill:  2  . atorvastatin (LIPITOR) 20 MG tablet    Sig: Take 1 tablet (20 mg total) by mouth every morning.    Dispense:  90 tablet    Refill:  3    Patient  Instructions  Medication Instructions:  The current medical regimen is effective;  continue present plan and medications.  *If you need a refill on your cardiac medications before your next appointment, please call your pharmacy*  Follow-Up: At [facility], you and your health needs are our priority.  As part of our continuing mission to provide you with exceptional heart care, we have created designated Provider Care Teams.  These Care Teams include your primary Cardiologist (physician) and Advanced Practice Providers (APPs -  Physician Assistants and Nurse Practitioners) who all work together to provide you with the care you need, when you need it.  Your next appointment:   2 month(s)  The format for your next appointment:   In Person  Provider:   Eleonore Chiquito, MD        Signed, Addison Naegeli. Audie Box, [place]  [address], [place], [place] [address] [phone]  09/25/2019 12:26 PM

## 2019-09-25 ENCOUNTER — Ambulatory Visit (INDEPENDENT_AMBULATORY_CARE_PROVIDER_SITE_OTHER): Payer: Medicare Other | Admitting: Cardiovascular Disease

## 2019-09-25 ENCOUNTER — Other Ambulatory Visit: Payer: Self-pay

## 2019-09-25 ENCOUNTER — Encounter: Payer: Self-pay | Admitting: Cardiovascular Disease

## 2019-09-25 VITALS — BP 140/68 | HR 74 | Temp 97.0°F | Ht 67.0 in | Wt 172.2 lb

## 2019-09-25 DIAGNOSIS — R001 Bradycardia, unspecified: Secondary | ICD-10-CM

## 2019-09-25 DIAGNOSIS — I5032 Chronic diastolic (congestive) heart failure: Secondary | ICD-10-CM | POA: Diagnosis not present

## 2019-09-25 DIAGNOSIS — I10 Essential (primary) hypertension: Secondary | ICD-10-CM | POA: Diagnosis not present

## 2019-09-25 DIAGNOSIS — E782 Mixed hyperlipidemia: Secondary | ICD-10-CM | POA: Diagnosis not present

## 2019-09-25 DIAGNOSIS — I440 Atrioventricular block, first degree: Secondary | ICD-10-CM | POA: Diagnosis not present

## 2019-09-25 MED ORDER — CLOPIDOGREL BISULFATE 75 MG PO TABS: 75.0000 mg | ORAL_TABLET | Freq: Every day | ORAL | 3 refills | Status: DC

## 2019-09-25 MED ORDER — ASPIRIN 81 MG PO CHEW: 81.0000 mg | CHEWABLE_TABLET | Freq: Every morning | ORAL | 3 refills | Status: AC

## 2019-09-25 MED ORDER — TORSEMIDE 100 MG PO TABS: 50.0000 mg | ORAL_TABLET | Freq: Every day | ORAL | 2 refills | Status: DC

## 2019-09-25 MED ORDER — ATORVASTATIN CALCIUM 20 MG PO TABS: 20.0000 mg | ORAL_TABLET | Freq: Every morning | ORAL | 3 refills | Status: DC

## 2019-09-25 MED ORDER — AMLODIPINE BESYLATE 10 MG PO TABS: 10.0000 mg | ORAL_TABLET | Freq: Every day | ORAL | 3 refills | Status: DC

## 2019-09-25 NOTE — Patient Instructions (Signed)
Medication Instructions:  The current medical regimen is effective;  continue present plan and medications.  *If you need a refill on your cardiac medications before your next appointment, please call your pharmacy*  Follow-Up: At [facility], you and your health needs are our priority.  As part of our continuing mission to provide you with exceptional heart care, we have created designated Provider Care Teams.  These Care Teams include your primary Cardiologist (physician) and Advanced Practice Providers (APPs -  Physician Assistants and Nurse Practitioners) who all work together to provide you with the care you need, when you need it.  Your next appointment:   2 month(s)  The format for your next appointment:   In Person  Provider:   Eleonore Chiquito, MD

## 2019-09-30 ENCOUNTER — Telehealth: Payer: Self-pay | Admitting: *Deleted

## 2019-09-30 NOTE — Telephone Encounter (Signed)
Patients mom is in hospital he will call back when he can schedule

## 2019-10-01 ENCOUNTER — Ambulatory Visit (INDEPENDENT_AMBULATORY_CARE_PROVIDER_SITE_OTHER): Payer: Medicare Other | Admitting: Sports Medicine

## 2019-10-01 ENCOUNTER — Other Ambulatory Visit: Payer: Self-pay

## 2019-10-01 ENCOUNTER — Telehealth: Payer: Self-pay | Admitting: *Deleted

## 2019-10-01 ENCOUNTER — Encounter: Payer: Self-pay | Admitting: Sports Medicine

## 2019-10-01 VITALS — Temp 98.2°F

## 2019-10-01 DIAGNOSIS — I739 Peripheral vascular disease, unspecified: Secondary | ICD-10-CM

## 2019-10-01 DIAGNOSIS — E11621 Type 2 diabetes mellitus with foot ulcer: Secondary | ICD-10-CM

## 2019-10-01 DIAGNOSIS — E11610 Type 2 diabetes mellitus with diabetic neuropathic arthropathy: Secondary | ICD-10-CM

## 2019-10-01 DIAGNOSIS — L97422 Non-pressure chronic ulcer of left heel and midfoot with fat layer exposed: Secondary | ICD-10-CM | POA: Diagnosis not present

## 2019-10-01 NOTE — Progress Notes (Signed)
Subjective: [name] is a 69 y.o. male patient seen in office for follow-up evaluation of ulceration of the left foot.  Patient reports that he is doing ok but is concerned about mom who is in ICU in [place]. Reports that he did have an episode of bleeding when he did too much walking at [facility] but otherwise no issues. Has home nurse changing dressings 2-3x week, completed bactrim.  Patient denies nausea/fever/vomiting/chills/night sweats/shortness of breath. Patient has no other pedal complaints at this time.  Last A1c 13.0  FBS not recorded  Patient Active Problem List   Diagnosis Date Noted  . Dyslipidemia 08/19/2019  . Uncontrolled type 2 diabetes mellitus with hyperglycemia ([place])   . Hyperkalemia   . Acute on chronic diastolic CHF (congestive heart failure), NYHA class 3 ([place])   . Heart block AV complete ([place])   . Acute renal failure with acute tubular necrosis superimposed on stage 4 chronic kidney disease ([place])   . Optic atrophy of both eyes 06/13/2019  . H/O small bowel obstruction 05/17/2019  . PVD (peripheral vascular disease) ([place]) 05/14/2019  . Ulcer of left foot due to type 2 diabetes mellitus ([place])   . Skin ulcer of left foot, limited to breakdown of skin ([place])   . Charcot foot due to diabetes mellitus ([place])   . Charcot's joint of foot, left   . SBO (small bowel obstruction) ([place]) 05/06/2019  . Complete heart block ([place]) 05/06/2019  . Moderate protein-calorie malnutrition ([place])   . Diabetic foot ulcer ([place]) 10/19/2018  . Diabetic foot infection ([place])   . Pseudophakia, both eyes 10/04/2018  . Stage 3 chronic kidney disease 09/27/2018  . Adrenal insufficiency ([place]) 04/09/2015  . Peripheral edema 09/17/2014  . Shortness of breath 09/17/2014  . Bronchospasm, acute 09/16/2014  . Partial small bowel obstruction ([place]) 09/16/2014  . Hyperglycemia   . Diabetic retinopathy associated with type 2 diabetes mellitus ([place]) 09/13/2014  . Abdominal pain 09/13/2014  .  Cough 11/15/2012  . Pharyngitis 09/14/2012  . Fluid overload 09/03/2012  . Ileus following gastrointestinal surgery ([place]) 09/03/2012  . Hypokalemia 08/30/2012  . Small bowel obstruction s/p LOA [id number] 08/21/2012  . Abnormal EKG 08/21/2012  . Chest pain 08/21/2012  . Macular degeneration (senile) of retina 12/15/2011  . Lens replaced 12/15/2011  . Vitreous hemorrhage ([place]) 12/15/2011  . Essential hypertension 08/19/2008  . Regional enteritis of small intestine ([place]) 01/03/2008  . ERECTILE DYSFUNCTION, ORGANIC 01/03/2008  . Sarcoidosis 07/19/2007  . Type 2 diabetes, uncontrolled, with retinopathy ([place]) 07/19/2007  . HYPERLIPIDEMIA, MIXED 07/19/2007  . CERUMEN IMPACTION, BILATERAL 07/19/2007  . URINARY INCONTINENCE, STRESS, MALE 08/17/2006  . PROSTATE CANCER, HX OF 08/17/2006  . DIABETIC  RETINOPATHY 01/09/2002   Current Outpatient Medications on File Prior to Visit  Medication Sig Dispense Refill  . acetaminophen (TYLENOL) 650 MG CR tablet Take 650 mg by mouth every 8 (eight) hours as needed for pain.    [name] amLODipine (NORVASC) 10 MG tablet Take 1 tablet (10 mg total) by mouth daily. 90 tablet 3  . aspirin 81 MG chewable tablet Chew 1 tablet (81 mg total) by mouth every morning. 90 tablet 3  . atorvastatin (LIPITOR) 20 MG tablet Take 1 tablet (20 mg total) by mouth every morning. 90 tablet 3  . blood glucose meter kit and supplies KIT Dispense based on patient and insurance preference. Use up to four times daily as directed. 1 each 1  . Blood Glucose Monitoring Suppl (RELION CONFIRM GLUCOSE MONITOR) w/Device KIT  Use to check blood sugar 3 times a day. 1 kit 0  . clopidogrel (PLAVIX) 75 MG tablet Take 1 tablet (75 mg total) by mouth daily with breakfast. 30 tablet 3  . docusate sodium (COLACE) 100 MG capsule Take 100 mg by mouth daily as needed for mild constipation.    . gabapentin (NEURONTIN) 300 MG capsule TAKE 1 CAPSULE(300 MG) BY MOUTH THREE TIMES DAILY (Patient taking differently:  Take 300 mg by mouth 2 (two) times daily. Take 300 mg by mouth in the morning and 300 mg at bedtime- may take an additional 300 mg once a day as needed/as directed) 90 capsule 0  . Insulin Isophane & Regular Human (HUMULIN 70/30 MIX) (70-30) 100 UNIT/ML PEN Inject 16-20 Units into the skin daily before breakfast AND 16-20 Units daily before supper. 15 mL   . Insulin Syringe-Needle U-100 (BD INSULIN SYRINGE ULTRAFINE) 31G X 5/16" 0.5 ML MISC Use 2x a day for insulin 100 each 5  . Lancets (FREESTYLE) lancets Use as instructed 100 each 0  . Lancets 30G MISC     . Multiple Vitamins-Minerals (CENTRUM SILVER 50+MEN) TABS Take 1 tablet by mouth daily with breakfast.    . Polyethyl Glycol-Propyl Glycol (SYSTANE OP) Place 1 drop into both eyes as needed (for dryness).     [name] sulfamethoxazole-trimethoprim (BACTRIM) 400-80 MG tablet Take 1 tablet by mouth 2 (two) times daily. 28 tablet 0  . torsemide (DEMADEX) 100 MG tablet Take 0.5 tablets (50 mg total) by mouth daily. 60 tablet 2  . Wound Dressings (MEDIHONEY WOUND/BURN DRESSING) GEL Apply to affected are 3 times a week, and cover with sterile dressing. 30 mL 1   No current facility-administered medications on file prior to visit.   Allergies  Allergen Reactions  . Ace Inhibitors Itching and Cough  . Naproxen Anaphylaxis, Shortness Of Breath and Other (See Comments)    Throat swells. cannot breathe, and causes GI distress      Objective: There were no vitals filed for this visit.  General: Patient is awake, alert, oriented x 3 and in no acute distress.  Dermatology: Skin is warm and dry bilateral with a blister with bogginess and drainage at plantar left lateral midfoot with mild reactive keratosis after debrided revealed at a opening that measures 0.5x0.5cm plantarlly and at the lateral ankle there is a wound that measures 0.5 x 0.6 cm with eschar base and with a decreased keratotic with underlying macerated border surrounding both wounds. There is  no malodor, minimal bloody active drainage, no erythema, no edema. No other acute signs of infection.   Vascular: Dorsalis Pedis pulse = 1/4 Bilateral,  Posterior Tibial pulse = 1/4 Bilateral,  Capillary Fill Time < 5 seconds  Neurologic: Protective sensation diminished bilateral.  Musculosketal: There is Charcot deformity noted on left foot.  Mild pain to previous ulceration at lateral aspect on left.   dNo results for input(s): GRAMSTAIN, LABORGA in the last 8760 hours.  Assessment and Plan:  Problem List Items Addressed This Visit      Endocrine   Diabetic foot ulcer ([place]) - Primary   Charcot foot due to diabetes mellitus ([place])    Other Visit Diagnoses    PAD (peripheral artery disease) ([place])         -Examined patient and re-discussed the progression of the wound and treatment alternatives. -Mechanically debrided wound plantar forefoot using sterile chisel blade revealing macerated skin and bleeding dermis.  -Applied Medihoney to wound and advised patient to continue with  home nursing changing the dressing 3 times per week -Continue with postop shoe with Pegasys Plastizote and offloading padding -Continue with vascular follow-up patient is status post angiogram - Advised patient to go to the ER or return to office if the wound worsens or if constitutional symptoms are present. -Advised walker for stability and to help with offloading ulcer -Referred patient to wound center for adjunctive treatment like HBO to help with wound healing -Patient to return to the office in 2 weeks for wound care or after seen by wound care center. Landis Martins, DPM

## 2019-10-01 NOTE — Telephone Encounter (Signed)
Called and spoke with the [facility] wound care center and faxed all the necessary paper work to the facility today. Lattie Haw

## 2019-10-01 NOTE — Telephone Encounter (Signed)
-----   Message from Landis Martins, [place] sent at 10/01/2019 10:33 AM EST ----- Regarding: Referral to wound center Refer to wound center for possible hyperbaric treatment to help with wound healing

## 2019-10-08 ENCOUNTER — Inpatient Hospital Stay (HOSPITAL_COMMUNITY): Admission: RE | Admit: 2019-10-08 | Payer: Medicare Other | Source: Ambulatory Visit

## 2019-10-08 ENCOUNTER — Ambulatory Visit: Payer: Medicare Other | Admitting: Vascular Surgery

## 2019-10-08 ENCOUNTER — Encounter (HOSPITAL_COMMUNITY): Payer: Medicare Other

## 2019-10-09 ENCOUNTER — Telehealth: Payer: Self-pay | Admitting: Internal Medicine

## 2019-10-09 NOTE — Telephone Encounter (Signed)
Sonja White F-NP from [facility]) representative went on a patient home visit  on 10/08/2019. Wanted to advise that he has not filled his insulin since 01/2019 and has not picked up meter that was sent over 09/10/2019.  She confirmed that his cost for insulin was 47.00 a

## 2019-10-11 ENCOUNTER — Telehealth: Payer: Self-pay | Admitting: Sports Medicine

## 2019-10-11 NOTE — Telephone Encounter (Signed)
error 

## 2019-10-11 NOTE — Telephone Encounter (Signed)
You can put this information on letterhead and I will sign it for the patient to pick up when I am there in office in [place] on Tuesday.  Thanks Dr. Chauncey Cruel

## 2019-10-11 NOTE — Telephone Encounter (Signed)
Patient wants a letter for his unemployment case, stating that he is unable to work due to [employer]. He worked at the [employer] and He said that is has been shut down several times due to [employer].  He had exausted his FMLA and didn't feel comfortable going back to work due to his wound and contracting covid. So he needs a letter stating that from our office. Please advise, or speak with patient? Thank you

## 2019-10-15 ENCOUNTER — Encounter: Payer: Self-pay | Admitting: Sports Medicine

## 2019-10-15 ENCOUNTER — Ambulatory Visit: Payer: Medicare Other | Admitting: Sports Medicine

## 2019-10-16 ENCOUNTER — Other Ambulatory Visit: Payer: Self-pay

## 2019-10-16 ENCOUNTER — Ambulatory Visit (INDEPENDENT_AMBULATORY_CARE_PROVIDER_SITE_OTHER): Payer: Medicare Other | Admitting: Sports Medicine

## 2019-10-16 DIAGNOSIS — L97401 Non-pressure chronic ulcer of unspecified heel and midfoot limited to breakdown of skin: Secondary | ICD-10-CM

## 2019-10-16 DIAGNOSIS — E11610 Type 2 diabetes mellitus with diabetic neuropathic arthropathy: Secondary | ICD-10-CM

## 2019-10-16 DIAGNOSIS — I739 Peripheral vascular disease, unspecified: Secondary | ICD-10-CM

## 2019-10-16 DIAGNOSIS — E08621 Diabetes mellitus due to underlying condition with foot ulcer: Secondary | ICD-10-CM

## 2019-10-16 NOTE — Progress Notes (Signed)
Subjective: [name] is a 69 y.o. male patient seen in office for follow-up evaluation of ulceration of the left foot.  Patient reports that he is doing ok and has a wound care appointment on Friday.  Patient has home nurse changing dressings 2-3x week, without any issues and reports that the nurse thinks that it is getting smaller.  Patient denies nausea/fever/vomiting/chills/night sweats/shortness of breath. Patient has no other pedal complaints at this time.  Last A1c 13.0  FBS not recorded patient does not routinely check  Patient Active Problem List   Diagnosis Date Noted  . Dyslipidemia 08/19/2019  . Uncontrolled type 2 diabetes mellitus with hyperglycemia ([place])   . Hyperkalemia   . Acute on chronic diastolic CHF (congestive heart failure), NYHA class 3 ([place])   . Heart block AV complete ([place])   . Acute renal failure with acute tubular necrosis superimposed on stage 4 chronic kidney disease ([place])   . Optic atrophy of both eyes 06/13/2019  . H/O small bowel obstruction 05/17/2019  . PVD (peripheral vascular disease) ([place]) 05/14/2019  . Ulcer of left foot due to type 2 diabetes mellitus ([place])   . Skin ulcer of left foot, limited to breakdown of skin ([place])   . Charcot foot due to diabetes mellitus ([place])   . Charcot's joint of foot, left   . SBO (small bowel obstruction) ([place]) 05/06/2019  . Complete heart block ([place]) 05/06/2019  . Moderate protein-calorie malnutrition ([place])   . Diabetic foot ulcer ([place]) 10/19/2018  . Diabetic foot infection ([place])   . Pseudophakia, both eyes 10/04/2018  . Stage 3 chronic kidney disease 09/27/2018  . Adrenal insufficiency ([place]) 04/09/2015  . Peripheral edema 09/17/2014  . Shortness of breath 09/17/2014  . Bronchospasm, acute 09/16/2014  . Partial small bowel obstruction ([place]) 09/16/2014  . Hyperglycemia   . Diabetic retinopathy associated with type 2 diabetes mellitus ([place]) 09/13/2014  . Abdominal pain 09/13/2014  . Cough 11/15/2012  .  Pharyngitis 09/14/2012  . Fluid overload 09/03/2012  . Ileus following gastrointestinal surgery ([place]) 09/03/2012  . Hypokalemia 08/30/2012  . Small bowel obstruction s/p LOA [id number] 08/21/2012  . Abnormal EKG 08/21/2012  . Chest pain 08/21/2012  . Macular degeneration (senile) of retina 12/15/2011  . Lens replaced 12/15/2011  . Vitreous hemorrhage ([place]) 12/15/2011  . Essential hypertension 08/19/2008  . Regional enteritis of small intestine ([place]) 01/03/2008  . ERECTILE DYSFUNCTION, ORGANIC 01/03/2008  . Sarcoidosis 07/19/2007  . Type 2 diabetes, uncontrolled, with retinopathy ([place]) 07/19/2007  . HYPERLIPIDEMIA, MIXED 07/19/2007  . CERUMEN IMPACTION, BILATERAL 07/19/2007  . URINARY INCONTINENCE, STRESS, MALE 08/17/2006  . PROSTATE CANCER, HX OF 08/17/2006  . DIABETIC  RETINOPATHY 01/09/2002   Current Outpatient Medications on File Prior to Visit  Medication Sig Dispense Refill  . acetaminophen (TYLENOL) 650 MG CR tablet Take 650 mg by mouth every 8 (eight) hours as needed for pain.    [name] amLODipine (NORVASC) 10 MG tablet Take 1 tablet (10 mg total) by mouth daily. 90 tablet 3  . aspirin 81 MG chewable tablet Chew 1 tablet (81 mg total) by mouth every morning. 90 tablet 3  . atorvastatin (LIPITOR) 20 MG tablet Take 1 tablet (20 mg total) by mouth every morning. 90 tablet 3  . blood glucose meter kit and supplies KIT Dispense based on patient and insurance preference. Use up to four times daily as directed. 1 each 1  . Blood Glucose Monitoring Suppl (RELION CONFIRM GLUCOSE MONITOR) w/Device KIT Use to check blood sugar  3 times a day. 1 kit 0  . clopidogrel (PLAVIX) 75 MG tablet Take 1 tablet (75 mg total) by mouth daily with breakfast. 30 tablet 3  . docusate sodium (COLACE) 100 MG capsule Take 100 mg by mouth daily as needed for mild constipation.    . gabapentin (NEURONTIN) 300 MG capsule TAKE 1 CAPSULE(300 MG) BY MOUTH THREE TIMES DAILY (Patient taking differently: Take 300 mg by mouth  2 (two) times daily. Take 300 mg by mouth in the morning and 300 mg at bedtime- may take an additional 300 mg once a day as needed/as directed) 90 capsule 0  . Insulin Isophane & Regular Human (HUMULIN 70/30 MIX) (70-30) 100 UNIT/ML PEN Inject 16-20 Units into the skin daily before breakfast AND 16-20 Units daily before supper. 15 mL   . Insulin Syringe-Needle U-100 (BD INSULIN SYRINGE ULTRAFINE) 31G X 5/16" 0.5 ML MISC Use 2x a day for insulin 100 each 5  . Lancets (FREESTYLE) lancets Use as instructed 100 each 0  . Lancets 30G MISC     . Multiple Vitamins-Minerals (CENTRUM SILVER 50+MEN) TABS Take 1 tablet by mouth daily with breakfast.    . Polyethyl Glycol-Propyl Glycol (SYSTANE OP) Place 1 drop into both eyes as needed (for dryness).     [name] sulfamethoxazole-trimethoprim (BACTRIM) 400-80 MG tablet Take 1 tablet by mouth 2 (two) times daily. 28 tablet 0  . torsemide (DEMADEX) 100 MG tablet Take 0.5 tablets (50 mg total) by mouth daily. 60 tablet 2  . Wound Dressings (MEDIHONEY WOUND/BURN DRESSING) GEL Apply to affected are 3 times a week, and cover with sterile dressing. 30 mL 1   No current facility-administered medications on file prior to visit.   Allergies  Allergen Reactions  . Ace Inhibitors Itching and Cough  . Naproxen Anaphylaxis, Shortness Of Breath and Other (See Comments)    Throat swells. cannot breathe, and causes GI distress      Objective: There were no vitals filed for this visit.  General: Patient is awake, alert, oriented x 3 and in no acute distress.  Dermatology: Skin is warm and dry bilateral with a blister with bogginess and drainage at plantar left lateral midfoot with mild reactive keratosis after debrided revealed at a opening that measures 0.5x0.6cm plantarlly and at the lateral ankle there is a wound that measures 0.5 x 0.7 cm with now granular base eschar appears to be resolved  with a decreased keratotic with underlying macerated border surrounding both  wounds. There is no malodor, minimal bloody active drainage, no erythema, no edema. No other acute signs of infection.   Vascular: Dorsalis Pedis pulse = 1/4 Bilateral,  Posterior Tibial pulse = 1/4 Bilateral,  Capillary Fill Time < 5 seconds  Neurologic: Protective sensation diminished bilateral.  Musculosketal: There is Charcot deformity noted on left foot.  Mild pain to previous ulceration at lateral aspect on left.   dNo results for input(s): GRAMSTAIN, LABORGA in the last 8760 hours.  Assessment and Plan:  Problem List Items Addressed This Visit      Endocrine   Diabetic foot ulcer ([place]) - Primary   Charcot foot due to diabetes mellitus ([place])    Other Visit Diagnoses    PAD (peripheral artery disease) ([place])         -Examined patient and re-discussed the progression of the wound and treatment alternatives. -Mechanically debrided wound plantar forefoot using sterile chisel blade revealing macerated skin and bleeding dermis.  -Applied Medihoney to wound and advised patient to continue  with home nursing changing the dressing 3 times per week -Continue with postop shoe with Pegasys Plastizote and offloading padding  -Continue with vascular follow-up patient is status post angiogram - Advised patient to go to the ER or return to office if the wound worsens or if constitutional symptoms are present. -Advised walker for stability and to help with offloading ulcer like previous and advised patient that once we get this wound he will benefit from a Milinda Cave walker/Crow boot -Patient is awaiting adjunctive treatment at wound care center has appointment on Friday for initial evaluation -Patient to return to the office after wound care center visit or sooner if problems or issues arise  Landis Martins, DPM

## 2019-10-18 ENCOUNTER — Telehealth: Payer: Self-pay | Admitting: Sports Medicine

## 2019-10-18 ENCOUNTER — Encounter (HOSPITAL_BASED_OUTPATIENT_CLINIC_OR_DEPARTMENT_OTHER): Payer: Medicare Other | Attending: Internal Medicine | Admitting: Internal Medicine

## 2019-10-18 ENCOUNTER — Other Ambulatory Visit: Payer: Self-pay

## 2019-10-18 DIAGNOSIS — E1122 Type 2 diabetes mellitus with diabetic chronic kidney disease: Secondary | ICD-10-CM | POA: Diagnosis not present

## 2019-10-18 DIAGNOSIS — E11621 Type 2 diabetes mellitus with foot ulcer: Secondary | ICD-10-CM | POA: Diagnosis not present

## 2019-10-18 DIAGNOSIS — I509 Heart failure, unspecified: Secondary | ICD-10-CM | POA: Diagnosis not present

## 2019-10-18 DIAGNOSIS — I130 Hypertensive heart and chronic kidney disease with heart failure and stage 1 through stage 4 chronic kidney disease, or unspecified chronic kidney disease: Secondary | ICD-10-CM | POA: Diagnosis not present

## 2019-10-18 DIAGNOSIS — Z8546 Personal history of malignant neoplasm of prostate: Secondary | ICD-10-CM | POA: Diagnosis not present

## 2019-10-18 DIAGNOSIS — K219 Gastro-esophageal reflux disease without esophagitis: Secondary | ICD-10-CM | POA: Insufficient documentation

## 2019-10-18 DIAGNOSIS — E1151 Type 2 diabetes mellitus with diabetic peripheral angiopathy without gangrene: Secondary | ICD-10-CM | POA: Diagnosis not present

## 2019-10-18 DIAGNOSIS — E1142 Type 2 diabetes mellitus with diabetic polyneuropathy: Secondary | ICD-10-CM | POA: Diagnosis not present

## 2019-10-18 DIAGNOSIS — L97522 Non-pressure chronic ulcer of other part of left foot with fat layer exposed: Secondary | ICD-10-CM | POA: Diagnosis present

## 2019-10-18 DIAGNOSIS — E11610 Type 2 diabetes mellitus with diabetic neuropathic arthropathy: Secondary | ICD-10-CM | POA: Insufficient documentation

## 2019-10-18 DIAGNOSIS — N189 Chronic kidney disease, unspecified: Secondary | ICD-10-CM | POA: Insufficient documentation

## 2019-10-18 DIAGNOSIS — E11319 Type 2 diabetes mellitus with unspecified diabetic retinopathy without macular edema: Secondary | ICD-10-CM | POA: Insufficient documentation

## 2019-10-18 NOTE — Telephone Encounter (Signed)
Return patient phone call.  Patient reports that he went to the wound care center today and want me to be updated on their plan of care patient reports that his visit was very pleasant and they were very thorough he reports that he was told that he needs to get updated foot x-rays to be done at [facility] as well as have his home nurse to discontinue using Medihoney and to use a healing/collagen strip of dressing to the area as well as next visit they may discuss putting him in a special healing cast with a boot.  Patient reports that he has a follow-up appointment with them on 10/25/2019.  I advised patient to keep this follow-up with the wound center for them to help with healing his wound.  Advised patient that once his wound is healed he can no later follow-up with me for our office to fabricate him a custom [employer].  Patient thanked me for returning his phone call. -Dr.Charissa Knowles

## 2019-10-18 NOTE — Telephone Encounter (Signed)
Returned patient call.

## 2019-10-21 ENCOUNTER — Telehealth: Payer: Self-pay

## 2019-10-22 ENCOUNTER — Other Ambulatory Visit: Payer: Self-pay

## 2019-10-22 ENCOUNTER — Ambulatory Visit (HOSPITAL_COMMUNITY)
Admission: RE | Admit: 2019-10-22 | Discharge: 2019-10-22 | Disposition: A | Discharge status: Home / Self Care | Payer: Medicare Other | Source: Ambulatory Visit | Attending: Internal Medicine | Admitting: Internal Medicine

## 2019-10-22 ENCOUNTER — Other Ambulatory Visit (HOSPITAL_COMMUNITY): Payer: Self-pay | Admitting: Internal Medicine

## 2019-10-22 DIAGNOSIS — M86172 Other acute osteomyelitis, left ankle and foot: Secondary | ICD-10-CM

## 2019-10-22 NOTE — Telephone Encounter (Signed)
Yes lets process application and please have him schedule to see me.

## 2019-10-23 ENCOUNTER — Encounter: Payer: Self-pay | Admitting: Cardiovascular Disease

## 2019-10-23 NOTE — Progress Notes (Signed)
Jons, Viola ([id number]) Visit Report for 10/18/2019 Chief Complaint Document Details Patient Name: Date of Service: JARY, LOUVIER 10/18/2019 10:30 AM Medical Record [id number] Patient Account Number: [id number] Date of Birth/Sex: Treating RN: 03/12/1951 (69 y.o. [name] Primary Care Provider: [name] Other Clinician: Referring Provider: Treating Provider/Extender:Rosette Bellavance, Wayland Denis, Clelia Schaumann in Treatment: 0 Information Obtained from: Patient Chief Complaint 10/18/2019; patient is here for review of wounds on the plantar left foot and on the lateral left foot Electronic Signature(s) Signed: 10/18/2019 5:21:33 PM By: Linton Ham MD Entered By: Linton Ham on 10/18/2019 12:44:56 -------------------------------------------------------------------------------- Debridement Details Patient Name: Date of Service: [name] 10/18/2019 10:30 AM Medical Record [id number] Patient Account Number: [id number] Date of Birth/Sex: Treating RN: Apr 07, 1951 (69 y.o. [name] Primary Care Provider: [name] Other Clinician: Referring Provider: Treating Provider/Extender:Shantoria Ellwood, Wayland Denis, Clelia Schaumann in Treatment: 0 Debridement Performed for Wound #1 Left,Lateral Foot Assessment: Performed By: Physician Ricard Dillon., MD Debridement Type: Debridement Severity of Tissue Pre Fat layer exposed Debridement: Level of Consciousness (Pre- Awake and Alert procedure): Pre-procedure Verification/Time Out Taken: Yes - 12:05 Start Time: 12:05 Pain Control: Other : benzocaine Total Area Debrided (L x W): 2.5 (cm) x 2.5 (cm) = 6.25 (cm) Tissue and other material Viable, Non-Viable, Callus, Slough, Subcutaneous, Skin: Epidermis, Slough debrided: Level: Skin/Subcutaneous Tissue Debridement Description: Excisional Instrument: Blade, Forceps Bleeding: Minimum Hemostasis Achieved: Pressure End Time: 12:12 Procedural  Pain: 0 Post Procedural Pain: 0 Response to Treatment: Procedure was tolerated well Level of Consciousness Awake and Alert (Post-procedure): Post Debridement Measurements of Total Wound Length: (cm) 2 Width: (cm) 1.8 Depth: (cm) 0.1 Volume: (cm) 0.283 Character of Wound/Ulcer Post Improved Debridement: Severity of Tissue Post Debridement: Fat layer exposed Post Procedure Diagnosis Same as Pre-procedure Electronic Signature(s) Signed: 10/18/2019 5:21:33 PM By: Linton Ham MD Signed: 10/21/2019 5:31:36 PM By: Kela Millin Entered By: Linton Ham on 10/18/2019 12:44:26 -------------------------------------------------------------------------------- Debridement Details Patient Name: Date of Service: [name] 10/18/2019 10:30 AM Medical Record [id number] Patient Account Number: [id number] Date of Birth/Sex: Treating RN: 02-26-1951 (69 y.o. [name] Primary Care Provider: [name] Other Clinician: Referring Provider: Treating Provider/Extender:Jariah Jarmon, Wayland Denis, Clelia Schaumann in Treatment: 0 Debridement Performed for Wound #2 Left,Plantar Foot Assessment: Performed By: Physician Ricard Dillon., MD Debridement Type: Debridement Severity of Tissue Pre Fat layer exposed Debridement: Level of Consciousness (Pre- Awake and Alert procedure): Pre-procedure Verification/Time Out Taken: Yes - 12:05 Start Time: 12:05 Pain Control: Other : benzocaine Total Area Debrided (L x W): 3 (cm) x 3.5 (cm) = 10.5 (cm) Tissue and other material Viable, Non-Viable, Callus, Slough, Subcutaneous, Skin: Epidermis, Slough Viable, Non-Viable, Callus, Slough, Subcutaneous, Skin: Epidermis, Slough debrided: Level: Skin/Subcutaneous Tissue Debridement Description: Excisional Instrument: Blade, Forceps Bleeding: Minimum Hemostasis Achieved: Pressure End Time: 12:12 Procedural Pain: 0 Post Procedural Pain: 0 Response to Treatment: Procedure was  tolerated well Level of Consciousness Awake and Alert (Post-procedure): Post Debridement Measurements of Total Wound Length: (cm) 0.6 Width: (cm) 0.7 Depth: (cm) 0.2 Volume: (cm) 0.066 Character of Wound/Ulcer Post Improved Debridement: Severity of Tissue Post Debridement: Fat layer exposed Post Procedure Diagnosis Same as Pre-procedure Electronic Signature(s) Signed: 10/18/2019 5:21:33 PM By: Linton Ham MD Signed: 10/21/2019 5:31:36 PM By: Kela Millin Entered By: Linton Ham on 10/18/2019 12:44:33 -------------------------------------------------------------------------------- HPI Details Patient Name: Date of Service: [name] 10/18/2019 10:30 AM Medical Record [id number] Patient Account Number: [id number] Date of Birth/Sex: Treating RN: [birth year] (69 y.o. [name] Primary Care Provider: [name]  Other Clinician: Referring Provider: Treating Provider/Extender:Tamarra Geiselman, Wayland Denis, Clelia Schaumann in Treatment: 0 History of Present Illness HPI Description: ADMISSION 10/18/2019 Patient is a 69 year old man with type 2 diabetes peripheral neuropathy and a Charcot foot. The problem I think began in March 2020 just about a year ago when he was admitted to hospital from 10/19/2018 through 10/24/2018 with a wound on his left lateral foot and intense cellulitis. MRI did not suggest osteomyelitis but did do note a draining sinus. He apparently received a prolonged course of antibiotics. His hemoglobin A1c at that time was 13.9. He was referred to Dr. [name] where he appears to been followed for probably 5 or 6 months. He was subsequently referred himself to Dr. [name] of podiatry. He has been offloaded in a surgical shoe with a Pegasys sole. He has been mostly using Medihoney and episodic debridement of intense surrounding callus. He has been referred here for further management of this difficult the heel wound area and consideration of  hyperbaric oxygen The patient had an angiogram in November. He did not have any proximal stenoses but he did have a 70 to 80% peroneal artery occlusion as well as an 80 to 95% stenosis of the anterior tibia which underwent an angioplasty. Follow-up ABIs were done on 07/02/2019 showed an ABI on the right of 1.26 on the left at 1.28 with TBI's of 0.65 and 0.67 respectively. Waveforms were triphasic Past medical history includes type 2 diabetes with neuropathy, PAD and retinopathy, prostate cancer, gastroesophageal reflux disease, chronic kidney disease, small bowel obstruction Electronic Signature(s) Signed: 10/18/2019 5:21:33 PM By: Linton Ham MD Entered By: Linton Ham on 10/18/2019 12:51:14 -------------------------------------------------------------------------------- Physical Exam Details Patient Name: Date of Service: [name] 10/18/2019 10:30 AM Medical Record [id number] Patient Account Number: [id number] Date of Birth/Sex: Treating RN: 02/16/1951 (69 y.o. [name] Primary Care Provider: [name] Other Clinician: Referring Provider: Treating Provider/Extender:Ellan Tess, Wayland Denis, Clelia Schaumann in Treatment: 0 Constitutional Sitting or standing Blood Pressure is within target range for patient.. Pulse regular and within target range for patient.[name] Respirations regular, non-labored and within target range.. Temperature is normal and within the target range for the patient.. Cardiovascular Needle and dorsalis pedis pulses are faintly palpable. Neurological Diabetic insensate neuropathy. Psychiatric appears at normal baseline. Notes Wound exam; the patient had a small dime sized area on the left midfoot surrounded by copious amounts of callus. This was undermined under the callus. Using pickups and a #10 scalpel large amounts of the callus were removed to hopefully allow a healing surface for the wound. On the left lateral foot there is a  quarter sized area of again with debris on the surface and circumference this is also debrided with pickups and a #10 scalpel. Electronic Signature(s) Signed: 10/18/2019 5:21:33 PM By: Linton Ham MD Entered By: Linton Ham on 10/18/2019 12:54:19 -------------------------------------------------------------------------------- Physician Orders Details Patient Name: Date of Service: DEVARIS, QUIRK 10/18/2019 10:30 AM Medical Record [id number] Patient Account Number: [id number] Date of Birth/Sex: Treating RN: 1951-02-10 (69 y.o. [name] Primary Care Provider: [name] Other Clinician: Referring Provider: Treating Provider/Extender:Joseeduardo Brix, Wayland Denis, Clelia Schaumann in Treatment: 0 Verbal / Phone Orders: No Diagnosis Coding Follow-up Appointments Return Appointment in 1 week. Dressing Change Frequency Wound #1 Left,Lateral Foot Change Dressing every other day. Wound #2 Left,Plantar Foot Change Dressing every other day. Wound Cleansing Wound #1 Left,Lateral Foot May shower and wash wound with soap and water. Wound #2 Left,Plantar Foot May shower and wash wound with soap and water.  Primary Wound Dressing Wound #1 Left,Lateral Foot Calcium Alginate with Silver Wound #2 Left,Plantar Foot Calcium Alginate with Silver Secondary Dressing Wound #1 Left,Lateral Foot Foam - donut Kerlix/Rolled Gauze Dry Gauze Wound #2 Left,Plantar Foot Foam - donut Kerlix/Rolled Gauze Dry Gauze Off-Loading Other: - minimal weight bearing left foot [place] skilled nursing for wound care. - [facility] X-ray, foot left complete view - diabetic foot ulcer left lateral foot and left plantar foot CPT 73630 ICD-10 Electronic Signature(s) Signed: 10/18/2019 5:21:33 PM By: Linton Ham MD Signed: 10/18/2019 5:33:53 PM By: Baruch Gouty RN, BSN Entered By: Baruch Gouty on 10/18/2019  12:37:04 -------------------------------------------------------------------------------- Prescription 10/18/2019 Patient Name: Hurwitz, Pradyun L. Provider: Linton Ham MD Date of Birth: 08/11/1951 NPI#: [id number] Sex: [name] DEA#: [id number] Phone #: [phone] License #: [id number] Patient Address: [address] [place], [place] [address] [address], [place] [address] [phone] Allergies Naprosyn Provider's Orders X-ray, foot left complete view - diabetic foot ulcer left lateral foot and left plantar foot CPT 73630 ICD-10 Signature(s): Date(s): Electronic Signature(s) Signed: 10/18/2019 5:21:33 PM By: Linton Ham MD Signed: 10/18/2019 5:33:53 PM By: Baruch Gouty RN, BSN Entered By: Baruch Gouty on 10/18/2019 12:37:04 --------------------------------------------------------------------------------  Problem List Details Patient Name: Date of Service: BRYTON, ROMAGNOLI 10/18/2019 10:30 AM Medical Record [id number] Patient Account Number: [id number] Date of Birth/Sex: Treating RN: 1951/01/30 (69 y.o. [name] Primary Care Provider: [name] Other Clinician: Referring Provider: Treating Provider/Extender:Alaisa Moffitt, Wayland Denis, Clelia Schaumann in Treatment: 0 Active Problems ICD-10 Evaluated Encounter Code Description Active Date Today Diagnosis L97.522 Non-pressure chronic ulcer of other part of left foot 10/18/2019 No Yes with fat layer exposed E11.621 Type 2 diabetes mellitus with foot ulcer 10/18/2019 No Yes E11.51 Type 2 diabetes mellitus with diabetic peripheral 10/18/2019 No Yes angiopathy without gangrene M14.672 Charcot's joint, left ankle and foot 10/18/2019 No Yes E11.40 Type 2 diabetes mellitus with diabetic neuropathy, 10/18/2019 No Yes unspecified Inactive Problems Resolved Problems Electronic Signature(s) Signed: 10/18/2019 5:21:33 PM By: Linton Ham  MD Entered By: Linton Ham on 10/18/2019 12:38:25 -------------------------------------------------------------------------------- Progress Note Details Patient Name: Date of Service: [name] 10/18/2019 10:30 AM Medical Record [id number] Patient Account Number: [id number] Date of Birth/Sex: Treating RN: December 03, 1950 (69 y.o. [name] Primary Care Provider: [name] Other Clinician: Referring Provider: Treating Provider/Extender:Lamae Fosco, Wayland Denis, Clelia Schaumann in Treatment: 0 Subjective Chief Complaint Information obtained from Patient 10/18/2019; patient is here for review of wounds on the plantar left foot and on the lateral left foot History of Present Illness (HPI) ADMISSION 10/18/2019 Patient is a 69 year old man with type 2 diabetes peripheral neuropathy and a Charcot foot. The problem I think began in March 2020 just about a year ago when he was admitted to hospital from 10/19/2018 through 10/24/2018 with a wound on his left lateral foot and intense cellulitis. MRI did not suggest osteomyelitis but did do note a draining sinus. He apparently received a prolonged course of antibiotics. His hemoglobin A1c at that time was 13.9. He was referred to Dr. [name] where he appears to been followed for probably 5 or 6 months. He was subsequently referred himself to Dr. [name] of podiatry. He has been offloaded in a surgical shoe with a Pegasys sole. He has been mostly using Medihoney and episodic debridement of intense surrounding callus. He has been referred here for further management of this difficult the heel wound area and consideration of hyperbaric oxygen  The patient had an angiogram in November. He did not have any proximal stenoses but he did have a 70 to 80% peroneal artery occlusion as well as an 80 to 95% stenosis of the anterior tibia which underwent an angioplasty. Follow-up ABIs were done on 07/02/2019 showed an ABI on the right of 1.26 on the  left at 1.28 with TBI's of 0.65 and 0.67 respectively. Waveforms were triphasic Past medical history includes type 2 diabetes with neuropathy, PAD and retinopathy, prostate cancer, gastroesophageal reflux disease, chronic kidney disease, small bowel obstruction Patient History Information obtained from Patient. Allergies Naprosyn Family History Diabetes - Siblings, Heart Disease - Mother, Hypertension - Siblings, No family history of Cancer, Hereditary Spherocytosis, Kidney Disease, Lung Disease, Seizures, Stroke, Thyroid Problems, Tuberculosis. Social History Former smoker, Marital Status - Married, Alcohol Use - Moderate, Drug Use - No History, Caffeine Use - Moderate. Medical History Eyes Denies history of Cataracts, Glaucoma, Optic Neuritis Ear/Nose/Mouth/Throat Denies history of Chronic sinus problems/congestion, Middle ear problems Hematologic/Lymphatic Denies history of Anemia, Hemophilia, Human Immunodeficiency Virus, Lymphedema, Sickle Cell Disease Respiratory Denies history of Aspiration, Asthma, Chronic Obstructive Pulmonary Disease (COPD), Pneumothorax, Sleep Apnea, Tuberculosis Cardiovascular Patient has history of Congestive Heart Failure, Hypertension, Peripheral Venous Disease Denies history of Angina, Arrhythmia, Coronary Artery Disease, Deep Vein Thrombosis, Hypotension, Myocardial Infarction, Peripheral Arterial Disease, Phlebitis, Vasculitis Gastrointestinal Denies history of Cirrhosis , Colitis, Crohnoos, Hepatitis A, Hepatitis B, Hepatitis C Endocrine Patient has history of Type II Diabetes Denies history of Type I Diabetes Genitourinary Denies history of End Stage Renal Disease Immunological Denies history of Lupus Erythematosus, Raynaudoos, Scleroderma Integumentary (Skin) Denies history of History of Burn Musculoskeletal Denies history of Gout, Rheumatoid Arthritis, Osteoarthritis, Osteomyelitis Neurologic Denies history of Dementia, Neuropathy,  Quadriplegia, Paraplegia, Seizure Disorder Oncologic Denies history of Received Chemotherapy, Received Radiation Psychiatric Denies history of Anorexia/bulimia, Confinement Anxiety Patient is treated with Insulin, Oral Agents. Review of Systems (ROS) Constitutional Symptoms (General Health) Denies complaints or symptoms of Fatigue, Fever, Chills, Marked Weight Change. Eyes Denies complaints or symptoms of Dry Eyes, Vision Changes, Glasses / Contacts. Ear/Nose/Mouth/Throat Denies complaints or symptoms of Chronic sinus problems or rhinitis. Respiratory Denies complaints or symptoms of Chronic or frequent coughs, Shortness of Breath. Cardiovascular Denies complaints or symptoms of Chest pain. Gastrointestinal Denies complaints or symptoms of Frequent diarrhea, Nausea, Vomiting. Endocrine Denies complaints or symptoms of Heat/cold intolerance. Genitourinary Denies complaints or symptoms of Frequent urination. Integumentary (Skin) Complains or has symptoms of Wounds. Musculoskeletal Denies complaints or symptoms of Muscle Pain, Muscle Weakness. Neurologic Denies complaints or symptoms of Numbness/parasthesias. Oncologic history of prostate ca Psychiatric Denies complaints or symptoms of Claustrophobia, Suicidal. Objective Constitutional Sitting or standing Blood Pressure is within target range for patient.. Pulse regular and within target range for patient.[name] Respirations regular, non-labored and within target range.. Temperature is normal and within the target range for the patient.. Vitals Time Taken: 11:26 AM, Height: 67 in, Source: Stated, Weight: 173 lbs, Source: Stated, BMI: 27.1, Temperature: 97.9 F, Pulse: 61 bpm, Respiratory Rate: 18 breaths/min, Blood Pressure: 140/73 mmHg. Cardiovascular Needle and dorsalis pedis pulses are faintly palpable. Neurological Diabetic insensate neuropathy. Psychiatric appears at normal baseline. General Notes: Wound exam; the patient  had a small dime sized area on the left midfoot surrounded by copious amounts of callus. This was undermined under the callus. Using pickups and a #10 scalpel large amounts of the callus were removed to hopefully allow a healing surface for the wound. ooOn the left lateral foot there is a quarter  sized area of again with debris on the surface and circumference this is also debrided with pickups and a #10 scalpel. Integumentary (Hair, Skin) Wound #1 status is Open. Original cause of wound was Gradually Appeared. The wound is located on the Left,Lateral Foot. The wound measures 2cm length x 1.8cm width x 0.5cm depth; 2.827cm^2 area and 1.414cm^3 volume. There is Fat Layer (Subcutaneous Tissue) Exposed exposed. There is no tunneling noted, however, there is undermining starting at 9:00 and ending at 11:00 with a maximum distance of 1cm. There is a medium amount of serosanguineous drainage noted. There is large (67-100%) red, pink, pale granulation within the wound bed. There is a small (1-33%) amount of necrotic tissue within the wound bed including Adherent Slough. Wound #2 status is Open. Original cause of wound was Gradually Appeared. The wound is located on the [place]. The wound measures 0.6cm length x 0.7cm width x 0.2cm depth; 0.33cm^2 area and 0.066cm^3 volume. There is Fat Layer (Subcutaneous Tissue) Exposed exposed. There is no tunneling or undermining noted. There is a medium amount of serosanguineous drainage noted. The wound margin is flat and intact. There is medium (34-66%) pink granulation within the wound bed. There is a medium (34-66%) amount of necrotic tissue within the wound bed including Adherent Slough. Assessment Active Problems ICD-10 Non-pressure chronic ulcer of other part of left foot with fat layer exposed Type 2 diabetes mellitus with foot ulcer Type 2 diabetes mellitus with diabetic peripheral angiopathy without gangrene Charcot's joint, left ankle and  foot Type 2 diabetes mellitus with diabetic neuropathy, unspecified Procedures Wound #1 Pre-procedure diagnosis of Wound #1 is a Diabetic Wound/Ulcer of the Lower Extremity located on the Left,Lateral Foot .Severity of Tissue Pre Debridement is: Fat layer exposed. There was a Excisional Skin/Subcutaneous Tissue Debridement with a total area of 6.25 sq cm performed by Ricard Dillon., MD. With the following instrument(s): Blade, and Forceps to remove Viable and Non-Viable tissue/material. Material removed includes Callus, Subcutaneous Tissue, Slough, and Skin: Epidermis after achieving pain control using Other (benzocaine). No specimens were taken. A time out was conducted at 12:05, prior to the start of the procedure. A Minimum amount of bleeding was controlled with Pressure. The procedure was tolerated well with a pain level of 0 throughout and a pain level of 0 following the procedure. Post Debridement Measurements: 2cm length x 1.8cm width x 0.1cm depth; 0.283cm^3 volume. Character of Wound/Ulcer Post Debridement is improved. Severity of Tissue Post Debridement is: Fat layer exposed. Post procedure Diagnosis Wound #1: Same as Pre-Procedure Wound #2 Pre-procedure diagnosis of Wound #2 is a Diabetic Wound/Ulcer of the Lower Extremity located on the Left,Plantar Foot .Severity of Tissue Pre Debridement is: Fat layer exposed. There was a Excisional Skin/Subcutaneous Tissue Debridement with a total area of 10.5 sq cm performed by Ricard Dillon., MD. With the following instrument(s): Blade, and Forceps to remove Viable and Non-Viable tissue/material. Material removed includes Callus, Subcutaneous Tissue, Slough, and Skin: Epidermis after achieving pain control using Other (benzocaine). No specimens were taken. A time out was conducted at 12:05, prior to the start of the procedure. A Minimum amount of bleeding was controlled with Pressure. The procedure was tolerated well with a pain level  of 0 throughout and a pain level of 0 following the procedure. Post Debridement Measurements: 0.6cm length x 0.7cm width x 0.2cm depth; 0.066cm^3 volume. Character of Wound/Ulcer Post Debridement is improved. Severity of Tissue Post Debridement is: Fat layer exposed. Post procedure Diagnosis Wound #2: Same as  Pre-Procedure Plan Follow-up Appointments: Return Appointment in 1 week. Dressing Change Frequency: Wound #1 Left,Lateral Foot: Change Dressing every other day. Wound #2 Left,Plantar Foot: Change Dressing every other day. Wound Cleansing: Wound #1 Left,Lateral Foot: May shower and wash wound with soap and water. Wound #2 Left,Plantar Foot: May shower and wash wound with soap and water. Primary Wound Dressing: Wound #1 Left,Lateral Foot: Calcium Alginate with Silver Wound #2 Left,Plantar Foot: Calcium Alginate with Silver Secondary Dressing: Wound #1 Left,Lateral Foot: Foam - donut Kerlix/Rolled Gauze Dry Gauze Wound #2 Left,Plantar Foot: Foam - donut Kerlix/Rolled Gauze Dry Gauze Off-Loading: Other: - minimal weight bearing left foot Home Health: [place] [facility] skilled nursing for wound care. - [facility] ordered were: X-ray, foot left complete view - diabetic foot ulcer left lateral foot and left plantar foot CPT 73630 ICD-10 1. We use silver alginate, foam doughnut, Kerlix and rolled gauze 2. We continued in his surgical shoe at least for another week 3. I would like to get an x-ray of this foot. Previous MRI in March of last year did not show osteomyelitis however he has had this for a long period of time now. He says that he had a course of antibiotics about 2 months ago. 4. He has had an aggressive revascularization in November and things seem to be satisfactory currently. His pulses were palpable foot was warm. 5. I see this as an offloading issue at least that first pass. I told him that I thought he would need a total contact cast to properly  offload this area. I will look forward to that next week after he has had the x-ray 6. I did not see any current evidence of infection no cultures were done. 7. I am uncertain about his diabetic control at least for the moment. Previously this was very poor 8. I did see him walk out of the clinic. Very unsteady gait. I am hopeful that we will not preclude a cast I spent 40 minutes in review of this patient's records, face-to-face and physical examination and preparation of this record Electronic Signature(s) Signed: 10/18/2019 5:21:33 PM By: Linton Ham MD Entered By: Linton Ham on 10/18/2019 12:58:01 -------------------------------------------------------------------------------- HxROS Details Patient Name: Date of Service: [name] 10/18/2019 10:30 AM Medical Record [id number] Patient Account Number: [id number] Date of Birth/Sex: Treating RN: 1951/03/29 (68 y.o. [name] Primary Care Provider: [name] Other Clinician: Referring Provider: Treating Provider/Extender:Draden Cottingham, Wayland Denis, Clelia Schaumann in Treatment: 0 Information Obtained From Patient Constitutional Symptoms (General Health) Complaints and Symptoms: Negative for: Fatigue; Fever; Chills; Marked Weight Change Eyes Complaints and Symptoms: Negative for: Dry Eyes; Vision Changes; Glasses / Contacts Medical History: Negative for: Cataracts; Glaucoma; Optic Neuritis Ear/Nose/Mouth/Throat Complaints and Symptoms: Negative for: Chronic sinus problems or rhinitis Medical History: Negative for: Chronic sinus problems/congestion; Middle ear problems Respiratory Complaints and Symptoms: Negative for: Chronic or frequent coughs; Shortness of Breath Medical History: Negative for: Aspiration; Asthma; Chronic Obstructive Pulmonary Disease (COPD); Pneumothorax; Sleep Apnea; Tuberculosis Cardiovascular Complaints and Symptoms: Negative for: Chest pain Medical History: Positive for:  Congestive Heart Failure; Hypertension; Peripheral Venous Disease Negative for: Angina; Arrhythmia; Coronary Artery Disease; Deep Vein Thrombosis; Hypotension; Myocardial Infarction; Peripheral Arterial Disease; Phlebitis; Vasculitis Gastrointestinal Complaints and Symptoms: Negative for: Frequent diarrhea; Nausea; Vomiting Medical History: Negative for: Cirrhosis ; Colitis; Crohns; Hepatitis A; Hepatitis B; Hepatitis C Endocrine Complaints and Symptoms: Negative for: Heat/cold intolerance Medical History: Positive for: Type II Diabetes Negative for: Type I Diabetes Time with diabetes: 24 Treated with:  Insulin, Oral agents Genitourinary Complaints and Symptoms: Negative for: Frequent urination Medical History: Negative for: End Stage Renal Disease Integumentary (Skin) Complaints and Symptoms: Positive for: Wounds Medical History: Negative for: History of Burn Musculoskeletal Complaints and Symptoms: Negative for: Muscle Pain; Muscle Weakness Medical History: Negative for: Gout; Rheumatoid Arthritis; Osteoarthritis; Osteomyelitis Neurologic Complaints and Symptoms: Negative for: Numbness/parasthesias Medical History: Negative for: Dementia; Neuropathy; Quadriplegia; Paraplegia; Seizure Disorder Psychiatric Complaints and Symptoms: Negative for: Claustrophobia; Suicidal Medical History: Negative for: Anorexia/bulimia; Confinement Anxiety Hematologic/Lymphatic Medical History: Negative for: Anemia; Hemophilia; Human Immunodeficiency Virus; Lymphedema; Sickle Cell Disease Immunological Medical History: Negative for: Lupus Erythematosus; Raynauds; Scleroderma Oncologic Complaints and Symptoms: Review of System Notes: history of prostate ca Medical History: Negative for: Received Chemotherapy; Received Radiation Immunizations Pneumococcal Vaccine: Received Pneumococcal Vaccination: No Implantable Devices None Family and Social History Cancer: No; Diabetes: Yes -  Siblings; Heart Disease: Yes - Mother; Hereditary Spherocytosis: No; Hypertension: Yes - Siblings; Kidney Disease: No; Lung Disease: No; Seizures: No; Stroke: No; Thyroid Problems: No; Tuberculosis: No; Former smoker; Marital Status - Married; Alcohol Use: Moderate; Drug Use: No History; Caffeine Use: Moderate; Financial Concerns: No; Food, Clothing or Shelter Needs: No; Support System Lacking: No Electronic Signature(s) Signed: 10/18/2019 5:21:33 PM By: Linton Ham MD Signed: 10/23/2019 7:20:02 AM By: Carlene Coria RN Entered By: Carlene Coria on 10/18/2019 11:33:11 -------------------------------------------------------------------------------- SuperBill Details Patient Name: Date of Service: MARWAN, LIPE 10/18/2019 Medical Record [id number] Patient Account Number: [id number] Date of Birth/Sex: Treating RN: [birth year] (69 y.o. [name] Primary Care Provider: [name] Other Clinician: Referring Provider: Treating Provider/Extender:Dequan Kindred, Wayland Denis, Clelia Schaumann in Treatment: 0 Diagnosis Coding ICD-10 Codes Code Description [phone] Non-pressure chronic ulcer of other part of left foot with fat layer exposed E11.621 Type 2 diabetes mellitus with foot ulcer E11.51 Type 2 diabetes mellitus with diabetic peripheral angiopathy without gangrene M14.672 Charcot's joint, left ankle and foot E11.40 Type 2 diabetes mellitus with diabetic neuropathy, unspecified Facility Procedures CPT4 Code Description: [id number] - WOUND CARE VISIT-LEV 3 EST PT Modifier: 25 Quantity: 1 CPT4 Code Description: [id number] - DEB SUBQ TISSUE 20 SQ CM/< ICD-10 Diagnosis Description L97.522 Non-pressure chronic ulcer of other part of left foot wit Modifier: h fat layer exp Quantity: 1 osed Physician Procedures CPT4 Code Description: [id number] WC PHYS LEVEL 3 NEW PT ICD-10 Diagnosis Description L97.522 Non-pressure chronic ulcer of other part of left foot E11.51 Type 2  diabetes mellitus with diabetic peripheral angi E11.621 Type 2 diabetes mellitus with foot  ulcer E11.40 Type 2 diabetes mellitus with diabetic neuropathy, uns Modifier: 25 with fat layer opathy without pecified Quantity: 1 exposed gangrene CPT4 Code Description: [id number] - WC PHYS SUBQ TISS 20 SQ CM ICD-10 Diagnosis Description L97.522 Non-pressure chronic ulcer of other part of left foot wit Modifier: h fat layer exp Quantity: 1 osed Electronic Signature(s) Signed: 10/18/2019 5:21:33 PM By: Linton Ham MD Signed: 10/18/2019 5:33:53 PM By: Baruch Gouty RN, BSN Entered By: Baruch Gouty on 10/18/2019 16:56:04

## 2019-10-23 NOTE — Progress Notes (Signed)
[name] ([id number]) Visit Report for 10/18/2019 Allergy List Details Patient Name: Date of Service: [name], [name] 10/18/2019 10:30 AM Medical Record [id number] Patient Account Number: [id number] Date of Birth/Sex: Treating RN: [birth year][name] (68 y.o. [name]) Carlene Coria Primary Care Cale Decarolis: [name] Other Clinician: Referring Saket Hellstrom: Treating Ananth Fiallos/Extender:Robson, Wayland Denis, Irma Weeks in Treatment: 0 Allergies Active Allergies Naprosyn Allergy Notes Electronic Signature(s) Signed: 10/23/2019 7:20:02 AM By: Carlene Coria RN Entered By: Carlene Coria on 10/18/2019 11:28:02 -------------------------------------------------------------------------------- Welsh Details Patient Name: Date of Service: [name] 10/18/2019 10:30 AM Medical Record [id number] Patient Account Number: [id number] Date of Birth/Sex: Treating RN: [name][birth year] (68 y.o. [name] Primary Care Akiera Allbaugh: [name] Other Clinician: Referring Ellenora Talton: Treating Hayley Horn/Extender:Robson, Wayland Denis, Clelia Schaumann in Treatment: 0 Visit Information Patient Arrived: Ambulatory Arrival Time: 11:22 Accompanied By: self Transfer Assistance: None Patient Identification Verified: Yes Secondary Verification Process Yes Completed: Patient Requires Transmission-Based No Precautions: Patient Has Alerts: Yes Patient Alerts: Patient on Blood Thinner Electronic Signature(s) Signed: 10/23/2019 7:20:02 AM By: Carlene Coria RN Entered By: Carlene Coria on 10/18/2019 11:26:27 -------------------------------------------------------------------------------- Clinic Level of Care Assessment Details Patient Name: Date of Service: [name], [name] 10/18/2019 10:30 AM Medical Record [id number] Patient Account Number: [id number] Date of Birth/Sex: Treating RN: [name][birth year] (69 y.o. [name] Primary Care Shaquira Moroz: [name] Other  Clinician: Referring Tabbetha Kutscher: Treating Anothy Bufano/Extender:Robson, Wayland Denis, Clelia Schaumann in Treatment: 0 Clinic Level of Care Assessment Items TOOL 1 Quantity Score []  - Use when EandM and Procedure is performed on INITIAL visit 0 ASSESSMENTS - Nursing Assessment / Reassessment X - General Physical Exam (combine w/ comprehensive assessment (listed just below) 1 20 when performed on new pt. evals) X - Comprehensive Assessment (HX, ROS, Risk Assessments, Wounds Hx, etc.) 1 25 ASSESSMENTS - Wound and Skin Assessment / Reassessment []  - Dermatologic / Skin Assessment (not related to wound area) 0 ASSESSMENTS - Ostomy and/or Continence Assessment and Care []  - Incontinence Assessment and Management 0 []  - Ostomy Care Assessment and Management (repouching, etc.) 0 PROCESS - Coordination of Care X - Simple Patient / Family Education for ongoing care 1 15 []  - Complex (extensive) Patient / Family Education for ongoing care 0 X - Staff obtains Programmer, systems, Records, Test Results / Process Orders 1 10 []  - Staff telephones HHA, Nursing Homes / Clarify orders / etc 0 []  - Routine Transfer to another Facility (non-emergent condition) 0 []  - Routine Hospital Admission (non-emergent condition) 0 X - New Admissions / Biomedical engineer / Ordering NPWT, Apligraf, etc. 1 15 []  - Emergency Hospital Admission (emergent condition) 0 PROCESS - Special Needs []  - Pediatric / Minor Patient Management 0 []  - Isolation Patient Management 0 []  - Hearing / Language / Visual special needs 0 []  - Assessment of Community assistance (transportation, D/C planning, etc.) 0 []  - Additional assistance / Altered mentation 0 []  - Support Surface(s) Assessment (bed, cushion, seat, etc.) 0 INTERVENTIONS - Miscellaneous []  - External ear exam 0 []  - Patient Transfer (multiple staff / Civil Service fast streamer / Similar devices) 0 []  - Simple Staple / Suture removal (25 or less) 0 []  - Complex Staple / Suture removal (26 or  more) 0 []  - Hypo/Hyperglycemic Management (do not check if billed separately) 0 []  - Ankle / Brachial Index (ABI) - do not check if billed separately 0 Has the patient been seen at the hospital within the last three years: Yes Total Score: 85 Level Of Care: New/Established - Level 3 Electronic Signature(s) Signed: 10/18/2019  5:33:53 PM By: Baruch Gouty RN, BSN Entered By: Baruch Gouty on 10/18/2019 12:17:40 -------------------------------------------------------------------------------- Encounter Discharge Information Details Patient Name: Date of Service: [name], [name] 10/18/2019 10:30 AM Medical Record [id number] Patient Account Number: [id number] Date of Birth/Sex: Treating RN: [name], [name] (69 y.o. [name] Primary Care Beyonca Wisz: [name] Other Clinician: Referring Mckenzye Cutright: Treating Delane Stalling/Extender:Robson, Wayland Denis, Clelia Schaumann in Treatment: 0 Encounter Discharge Information Items Post Procedure Vitals Discharge Condition: Stable Temperature (F): 97.9 Ambulatory Status: Ambulatory Pulse (bpm): 61 Discharge Destination: Home Respiratory Rate (breaths/min): 18 Transportation: Private Auto Blood Pressure (mmHg): 140/73 Accompanied By: alone Schedule Follow-up Appointment: Yes Clinical Summary of Care: Patient Declined Electronic Signature(s) Signed: 10/21/2019 5:59:01 PM By: Levan Hurst RN, BSN Entered By: Levan Hurst on 10/18/2019 16:40:59 -------------------------------------------------------------------------------- Lower Extremity Assessment Details Patient Name: Date of Service: [name] 10/18/2019 10:30 AM Medical Record [id number] Patient Account Number: [id number] Date of Birth/Sex: Treating RN: [birth year], [name] (68 y.o. [name]) Carlene Coria Primary Care Lorriane Dehart: [name] Other Clinician: Referring Shyrl Obi: Treating Sona Nations/Extender:Robson, Wayland Denis, Irma Weeks in Treatment: 0 Edema  Assessment Assessed: [Left: No] [Right: No] E[Left: dema] [Right: :] Calf Left: Right: Point of Measurement: 41 cm From Medial Instep 31 cm cm Ankle Left: Right: Point of Measurement: 9 cm From Medial Instep 21 cm cm Electronic Signature(s) Signed: 10/23/2019 7:20:02 AM By: Carlene Coria RN Entered By: Carlene Coria on 10/18/2019 11:41:33 -------------------------------------------------------------------------------- Multi Wound Chart Details Patient Name: Date of Service: [name] 10/18/2019 10:30 AM Medical Record [id number] Patient Account Number: [id number] Date of Birth/Sex: Treating RN: [name][birth year] (69 y.o. [name] Primary Care Vani Gunner: [name] Other Clinician: Referring Deagen Krass: Treating Antiono Ettinger/Extender:Robson, Wayland Denis, Clelia Schaumann in Treatment: 0 Vital Signs Height(in): 13 Pulse(bpm): 51 Weight(lbs): 173 Blood Pressure(mmHg): 140/73 Body Mass Index(BMI): 27 Temperature(F): 97.9 Respiratory 18 Rate(breaths/min): Photos: [1:No Photos] [2:No Photos] [N/A:N/A] Wound Location: [1:Left, Lateral Foot] [2:Left, Posterior Foot] [N/A:N/A] Wounding Event: [1:Gradually Appeared] [2:Gradually Appeared] [N/A:N/A] Primary Etiology: [1:Diabetic Wound/Ulcer of the Diabetic Wound/Ulcer of the N/A Lower Extremity] [2:Lower Extremity] Comorbid History: [1:Congestive Heart Failure, Congestive Heart Failure, N/A Hypertension, Peripheral Hypertension, Peripheral Venous Disease, Type II Venous Disease, Type II Diabetes] [2:Diabetes] Date Acquired: [1:08/15/2018] [2:10/15/2018] [N/A:N/A] Weeks of Treatment: [1:0] [2:0] [N/A:N/A] Wound Status: [1:Open] [2:Open] [N/A:N/A] Measurements L x W x D 2x1.8x0.5 [2:0.6x0.7x0.2] [N/A:N/A] (cm) Area (cm) : [1:2.827] [2:0.33] [N/A:N/A] Volume (cm) : [1:1.414] [2:0.066] [N/A:N/A] % Reduction in Area: [1:0.00%] [2:0.00%] [N/A:N/A] % Reduction in Volume: 0.00% [2:0.00%] [N/A:N/A] Starting Position 1  [1:9] (o'clock): Ending Position 1 [1:11] (o'clock): Maximum Distance 1 [1:1] (cm): Undermining: [1:Yes] [2:No] [N/A:N/A] Classification: [1:Grade 2] [2:Grade 2] [N/A:N/A] Exudate Amount: [1:Medium] [2:Medium] [N/A:N/A] Exudate Type: [1:Serosanguineous] [2:Serosanguineous] [N/A:N/A] Exudate Color: [1:red, brown] [2:red, brown] [N/A:N/A] Wound Margin: [1:N/A] [2:Flat and Intact] [N/A:N/A] Granulation Amount: [1:Large (67-100%)] [2:Medium (34-66%)] [N/A:N/A] Granulation Quality: [1:Red, Pink, Pale] [2:Pink] [N/A:N/A] Necrotic Amount: [1:Small (1-33%)] [2:Medium (34-66%)] [N/A:N/A] Exposed Structures: [1:Fat Layer (Subcutaneous Fat Layer (Subcutaneous N/A Tissue) Exposed: Yes Fascia: No Tendon: No Muscle: No Joint: No Bone: No] [2:Tissue) Exposed: Yes Fascia: No Tendon: No Muscle: No Joint: No Bone: No] Epithelialization: [1:N/A] [2:None] [N/A:N/A] Debridement: [1:Debridement - Excisional Debridement - Excisional N/A] Pre-procedure [1:12:05] [2:12:05] [N/A:N/A] Verification/Time Out Taken: Pain Control: [1:Other] [2:Other] [N/A:N/A] Tissue Debrided: [1:Callus, Subcutaneous, Slough] [2:Callus, Subcutaneous, Slough] [N/A:N/A] Level: [1:Skin/Subcutaneous Tissue Skin/Subcutaneous Tissue N/A] Debridement Area (sq cm):6.25 [2:10.5] [N/A:N/A] Instrument: [1:Blade, Forceps] [2:Blade, Forceps] [N/A:N/A] Bleeding: [1:Minimum] [2:Minimum] [N/A:N/A] Hemostasis Achieved: [1:Pressure] [2:Pressure] [N/A:N/A] Procedural Pain: [1:0] [2:0] [N/A:N/A] Post Procedural Pain: [1:0] [2:0] [N/A:N/A]  Debridement Treatment Procedure was tolerated [2:Procedure was tolerated] [N/A:N/A] Response: [1:well] [2:well] Post Debridement [1:2x1.8x0.1] [2:0.6x0.7x0.2] [N/A:N/A] Measurements L x W x D (cm) Post Debridement [1:0.283] [2:0.066] Volume: (cm) Procedures Performed: Debridement [2:Debridement] Treatment Notes Electronic Signature(s) Signed: 10/18/2019 5:21:33 PM By: Linton Ham MD Signed: 10/21/2019  5:31:36 PM By: Kela Millin Entered By: Linton Ham on 10/18/2019 12:44:16 -------------------------------------------------------------------------------- Multi-Disciplinary Care Plan Details Patient Name: Date of Service: [name], [name] 10/18/2019 10:30 AM Medical Record [id number] Patient Account Number: [id number] Date of Birth/Sex: Treating RN: [birth year], [name] (69 y.o. [name] Primary Care Julietta Batterman: [name] Other Clinician: Referring Elayne Gruver: Treating Kenyona Rena/Extender:Robson, Wayland Denis, Clelia Schaumann in Treatment: 0 Active Inactive Nutrition Nursing Diagnoses: Impaired glucose control: actual or potential Potential for alteratiion in Nutrition/Potential for imbalanced nutrition Goals: Patient/caregiver will maintain therapeutic glucose control Date Initiated: 10/18/2019 Target Resolution Date: 11/15/2019 Goal Status: Active Interventions: Assess HgA1c results as ordered upon admission and as needed Assess patient nutrition upon admission and as needed per policy Provide education on elevated blood sugars and impact on wound healing Treatment Activities: Patient referred to Primary Care Physician for further nutritional evaluation : 10/18/2019 Notes: Wound/Skin Impairment Nursing Diagnoses: Impaired tissue integrity Knowledge deficit related to ulceration/compromised skin integrity Goals: Patient/caregiver will verbalize understanding of skin care regimen Date Initiated: 10/18/2019 Target Resolution Date: 11/15/2019 Goal Status: Active Ulcer/skin breakdown will have a volume reduction of 30% by week 4 Date Initiated: 10/18/2019 Target Resolution Date: 11/15/2019 Goal Status: Active Interventions: Assess patient/caregiver ability to obtain necessary supplies Assess patient/caregiver ability to perform ulcer/skin care regimen upon admission and as needed Assess ulceration(s) every visit Provide education on ulcer and skin care Treatment  Activities: Skin care regimen initiated : 10/18/2019 Topical wound management initiated : 10/18/2019 Notes: Electronic Signature(s) Signed: 10/18/2019 5:33:53 PM By: Baruch Gouty RN, BSN Entered By: Baruch Gouty on 10/18/2019 11:53:08 -------------------------------------------------------------------------------- Pain Assessment Details Patient Name: Date of Service: [name] 10/18/2019 10:30 AM Medical Record [id number] Patient Account Number: [id number] Date of Birth/Sex: Treating RN: [birth year] (68 y.o. [name] Primary Care Astha Probasco: [name] Other Clinician: Referring Leetta Hendriks: Treating Mirielle Byrum/Extender:Robson, Wayland Denis, Clelia Schaumann in Treatment: 0 Active Problems Location of Pain Severity and Description of Pain Patient Has Paino No Site Locations Pain Management and Medication Current Pain Management: Electronic Signature(s) Signed: 10/23/2019 7:20:02 AM By: Carlene Coria RN Entered By: Carlene Coria on 10/18/2019 11:55:14 -------------------------------------------------------------------------------- Patient/Caregiver Education Details Patient Name: Date of Service: [name] 3/5/2021andnbsp10:30 AM Medical Record [id number] Patient Account Number: [id number] Date of Birth/Gender: Treating RN: [birth year][name] (69 y.o. [name] Primary Care Physician: [name] Other Clinician: Referring Physician: Treating Physician/Extender:Robson, Wayland Denis, Clelia Schaumann in Treatment: 0 Education Assessment Education Provided To: Patient Education Topics Provided Elevated Blood Sugar/ Impact on Healing: Handouts: Elevated Blood Sugars: How Do They Affect Wound Healing Methods: Explain/Verbal, Printed Responses: Reinforcements needed, State content correctly Offloading: Handouts: What is Offloadingo Methods: Explain/Verbal, Printed Responses: Reinforcements needed, State content correctly Welcome To [facility]: Handouts: Welcome [facility] Methods: Explain/Verbal, Printed Responses: Reinforcements needed, State content correctly Wound/Skin Impairment: Handouts: Caring for Your Ulcer, Skin Care Do's and Dont's Methods: Explain/Verbal, Printed Responses: Reinforcements needed, State content correctly Electronic Signature(s) Signed: 10/18/2019 5:33:53 PM By: Baruch Gouty RN, BSN Entered By: Baruch Gouty on 10/18/2019 11:54:10 -------------------------------------------------------------------------------- Wound Assessment Details Patient Name: Date of Service: [name] 10/18/2019 10:30 AM Medical Record [id number] Patient Account Number: [id number] Date of Birth/Sex: Treating RN: [birth year] [name] (69 y.o. M) [name],  [name] Primary Care Zykia Walla: [name] Other Clinician: Referring Jeff Frieden: Treating Schawn Byas/Extender:Robson, Wayland Denis, Clelia Schaumann in Treatment: 0 Wound Status Wound Number: 1 Primary Diabetic Wound/Ulcer of the Lower Extremity Etiology: Wound Location: Left Foot - Lateral Wound Open Wounding Event: Gradually Appeared Status: Date Acquired: 08/15/2018 Comorbid Congestive Heart Failure, Hypertension, Weeks Of Treatment: 0 History: Peripheral Venous Disease, Type II Diabetes Clustered Wound: No Photos Wound Measurements Length: (cm) 2 % Reduction in Are Width: (cm) 1.8 % Reduction in Vol Depth: (cm) 0.5 Tunneling: Area: (cm) 2.827 Undermining: Volume: (cm) 1.414 Starting Posit Ending Position Maximum Distanc a: 0% ume: 0% No Yes ion (o'clock): 9 (o'clock): 11 e: (cm) 1 Wound Description Classification: Grade 2 Foul Odor After Cl Exudate Amount: Medium Slough/Fibrino Exudate Type: Serosanguineous Exudate Color: red, brown Wound Bed Granulation Amount: Large (67-100%) Granulation Quality: Red, Pink, Pale Fascia Exposed: Necrotic Amount: Small (1-33%) Fat Layer [place] Flakes Necrotic Quality: Adherent  Slough Tendon Exposed: Muscle Exposed: Joint Exposed: Bone Exposed: Treatment Notes Wound #1 (Left, Lateral Foot) 1. Cleanse With Wound Cleanser 3. Primary Dressing Applied Calcium Alginate Ag 4. Secondary Dressing Dry Gauze Roll Gauze Foam 5. Secured With Tape eansing: No Yes Exposed Structure No neous Tissue) Exposed: Yes No No No No Electronic Signature(s) Signed: 10/18/2019 4:01:29 PM By: Mikeal Hawthorne EMT/HBOT Signed: 10/23/2019 7:20:02 AM By: Carlene Coria RN Entered By: Mikeal Hawthorne on 10/18/2019 15:36:56 -------------------------------------------------------------------------------- Wound Assessment Details Patient Name: Date of Service: [name]. 10/18/2019 10:30 AM Medical Record [id number] Patient Account Number: [id number] Date of Birth/Sex: Treating RN: [name]-17-[name] (69 y.o. [name] Primary Care Hildreth Robart: [name] Other Clinician: Referring Jarelly Rinck: Treating Launi Asencio/Extender:Robson, Wayland Denis, Clelia Schaumann in Treatment: 0 Wound Status Wound Number: 2 Primary Diabetic Wound/Ulcer of the Lower Extremity Etiology: Wound Location: Left Foot - Plantar Wound Open Wounding Event: Gradually Appeared Status: Date Acquired: 10/15/2018 Comorbid Congestive Heart Failure, Hypertension, Weeks Of Treatment: 0 History: Peripheral Venous Disease, Type II Diabetes Clustered Wound: No Photos Wound Measurements Length: (cm) 0.6 Width: (cm) 0.7 Depth: (cm) 0.2 Area: (cm) 0.33 Volume: (cm) 0.066 Wound Description Classification: Grade 2 Wound Margin: Flat and Intact Exudate Amount: Medium Exudate Type: Serosanguineous Exudate Color: red, brown Wound Bed Granulation Amount: Medium (34-66%) Granulation Quality: Pink Necrotic Amount: Medium (34-66%) Necrotic Quality: Adherent Slough fter Cleansing: No ino Yes Exposed Structure ed: No ubcutaneous Tissue) Exposed: Yes ed: No ed: No d: No : No % Reduction in Area:  0% % Reduction in Volume: 0% Epithelialization: None Tunneling: No Undermining: No Foul Odor A Slough/Fibr Fascia Expos Fat Layer (S Tendon Expos Muscle Expos Joint Expose Bone Exposed Treatment Notes Wound #2 (Left, Plantar Foot) 1. Cleanse With Wound Cleanser 3. Primary Dressing Applied Calcium Alginate Ag 4. Secondary Dressing Dry Gauze Roll Gauze Foam 5. Secured With Recruitment consultant) Signed: 10/18/2019 4:01:29 PM By: Mikeal Hawthorne EMT/HBOT Signed: 10/21/2019 5:31:36 PM By: Kela Millin Entered By: Mikeal Hawthorne on 10/18/2019 15:37:20 -------------------------------------------------------------------------------- Vitals Details Patient Name: Date of Service: [name] 10/18/2019 10:30 AM Medical Record [id number] Patient Account Number: [id number] Date of Birth/Sex: Treating RN: [birth year]-[name] (68 y.o. [name]) Carlene Coria Primary Care Shaune Westfall: [name] Other Clinician: Referring Graceann Boileau: Treating Kalynne Womac/Extender:Robson, Wayland Denis, Clelia Schaumann in Treatment: 0 Vital Signs Time Taken: 11:26 Temperature (F): 97.9 Height (in): 67 Pulse (bpm): 61 Source: Stated Respiratory Rate (breaths/min): 18 Weight (lbs): 173 Blood Pressure (mmHg): 140/73 Source: Stated Reference Range: 80 - 120 mg / dl Body Mass Index (BMI): 27.1 Electronic Signature(s) Signed: 10/23/2019 7:20:02 AM  By: Carlene Coria RN Entered By: Carlene Coria on 10/18/2019 11:27:16

## 2019-10-23 NOTE — Progress Notes (Signed)
[name] ([id number]) Visit Report for 10/18/2019 Abuse/Suicide Risk Screen Details Patient Name: Date of Service: [name], [name] 10/18/2019 10:30 AM Medical Record [id number] Patient Account Number: [id number] Date of Birth/Sex: Treating RN: 1951/03/28 (68 y.o. [name]) Carlene Coria Primary Care Demitri Kucinski: [name] Other Clinician: Referring Kaydin Karbowski: Treating Shakedra Beam/Extender:Robson, Wayland Denis, Clelia Schaumann in Treatment: 0 Abuse/Suicide Risk Screen Items Answer ABUSE RISK SCREEN: Has anyone close to you tried to hurt or harm you recentlyo No Do you feel uncomfortable with anyone in your familyo No Has anyone forced you do things that you didnt want to doo No Electronic Signature(s) Signed: 10/23/2019 7:20:02 AM By: Carlene Coria RN Entered By: Carlene Coria on 10/18/2019 11:33:18 -------------------------------------------------------------------------------- Activities of Daily Living Details Patient Name: Date of Service: [name], [name] 10/18/2019 10:30 AM Medical Record [id number] Patient Account Number: [id number] Date of Birth/Sex: Treating RN: 06-15-1951 (68 y.o. [name]) Carlene Coria Primary Care Jayziah Bankhead: [name] Other Clinician: Referring Saveon Plant: Treating Jian Hodgman/Extender:Robson, Wayland Denis, Clelia Schaumann in Treatment: 0 Activities of Daily Living Items Answer Activities of Daily Living (Please select one for each item) Drive Automobile Completely Able Take Medications Completely Able Use Telephone Completely [place] for Appearance Completely Able Use Toilet Completely Able Bath / Shower Completely Able Dress Self Completely Able Feed Self Completely Able Walk Completely Able Get In / Out Bed Completely Able Housework Completely Able Prepare Meals Completely Able Handle Money Completely Able Shop for Self Completely Able Electronic Signature(s) Signed: 10/23/2019 7:20:02 AM By: Carlene Coria RN Entered By: Carlene Coria on  10/18/2019 11:33:44 -------------------------------------------------------------------------------- Education Screening Details Patient Name: Date of Service: [name] 10/18/2019 10:30 AM Medical Record [id number] Patient Account Number: [id number] Date of Birth/Sex: Treating RN: 08/27/1950 (68 y.o. [name] Primary Care Zania Kalisz: [name] Other Clinician: Referring Markala Sitts: Treating Amai Cappiello/Extender:Robson, Wayland Denis, Clelia Schaumann in Treatment: 0 Primary Learner Assessed: Patient Learning Preferences/Education Level/Primary Language Learning Preference: Explanation Highest Education Level: High School Preferred Language: English Cognitive Barrier Language Barrier: No Translator Needed: No Memory Deficit: No Emotional Barrier: No Cultural/Religious Beliefs Affecting Medical Care: No Physical Barrier Impaired Vision: No Impaired Hearing: No Decreased Hand dexterity: No Knowledge/Comprehension Knowledge Level: Medium Comprehension Level: High Ability to understand written High instructions: Ability to understand verbal High instructions: Motivation Anxiety Level: Calm Cooperation: Cooperative Education Importance: Acknowledges Need Interest in Health Problems: Asks Questions Perception: Coherent Willingness to Engage in Self- High Management Activities: Readiness to Engage in Self- High Management Activities: Electronic Signature(s) Signed: 10/23/2019 7:20:02 AM By: Carlene Coria RN Entered By: Carlene Coria on 10/18/2019 11:34:13 -------------------------------------------------------------------------------- Fall Risk Assessment Details Patient Name: Date of Service: [name] 10/18/2019 10:30 AM Medical Record [id number] Patient Account Number: [id number] Date of Birth/Sex: Treating RN: 04/17/1951 (68 y.o. [name]) Carlene Coria Primary Care Janetta Vandoren: [name] Other Clinician: Referring Vandana Haman: Treating  [name]/Extender:Robson, Wayland Denis, Clelia Schaumann in Treatment: 0 Fall Risk Assessment Items Have you had 2 or more falls in the last 12 monthso 0 No Have you had any fall that resulted in injury in the last 12 monthso 0 No FALLS RISK SCREEN History of falling - immediate or within 3 months 0 No Secondary diagnosis (Do you have 2 or more medical diagnoseso) 0 No Ambulatory aid None/bed rest/wheelchair/nurse 0 No Crutches/cane/walker 0 No Furniture 0 No Intravenous therapy Access/Saline/Heparin Lock 0 No Weak (short steps with or without shuffle, stooped but able to lift head 0 No while walking, may seek support from furniture) Impaired (short steps with shuffle, may have difficulty  arising from chair, 0 No head down, impaired balance) Mental Status Oriented to own ability 0 No Overestimates or forgets limitations 0 No Risk Level: Low Risk Score: 0 Electronic Signature(s) Signed: 10/23/2019 7:20:02 AM By: Carlene Coria RN Entered By: Carlene Coria on 10/18/2019 11:34:20 -------------------------------------------------------------------------------- Foot Assessment Details Patient Name: Date of Service: [name] 10/18/2019 10:30 AM Medical Record [id number] Patient Account Number: [id number] Date of Birth/Sex: Treating RN: 1951-03-17 (68 y.o. [name]) Carlene Coria Primary Care Jung Yurchak: [name] Other Clinician: Referring Naziyah Tieszen: Treating Jarrell Armond/Extender:Robson, Wayland Denis, Clelia Schaumann in Treatment: 0 Foot Assessment Items Site Locations + = Sensation present, - = Sensation absent, C = Callus, U = Ulcer R = Redness, W = Warmth, M = Maceration, PU = Pre-ulcerative lesion F = Fissure, S = Swelling, D = Dryness Assessment Right: Left: Other Deformity: No No Prior Foot Ulcer: No No Prior Amputation: No No Charcot Joint: No No Ambulatory Status: Ambulatory Without Help Gait: Steady Electronic Signature(s) Signed: 10/23/2019 7:20:02 AM By: Carlene Coria RN Entered By: Carlene Coria on 10/18/2019 11:40:38 -------------------------------------------------------------------------------- Nutrition Risk Screening Details Patient Name: Date of Service: [name], [name] 10/18/2019 10:30 AM Medical Record [id number] Patient Account Number: [id number] Date of Birth/Sex: Treating RN: 1950/08/24 (68 y.o. [name]) Carlene Coria Primary Care Decker Cogdell: [name] Other Clinician: Referring Johnay Mano: Treating Glennis Borger/Extender:Robson, Wayland Denis, Irma Weeks in Treatment: 0 Height (in): 67 Weight (lbs): 173 Body Mass Index (BMI): 27.1 Nutrition Risk Screening Items Score Screening NUTRITION RISK SCREEN: I have an illness or condition that made me change the kind and/or 0 No amount of food I eat I eat fewer than two meals per day 0 No I eat few fruits and vegetables, or milk products 0 No I have three or more drinks of beer, liquor or wine almost every day 0 No I have tooth or mouth problems that make it hard for me to eat 0 No I don't always have enough money to buy the food I need 0 No I eat alone most of the time 0 No I take three or more different prescribed or over-the-counter drugs a day 1 Yes 0 No Without wanting to, I have lost or gained 10 pounds in the last six months I am not always physically able to shop, cook and/or feed myself 0 No Nutrition Protocols Good Risk Protocol 0 No interventions needed Moderate Risk Protocol High Risk Proctocol Risk Level: Good Risk Score: 1 Electronic Signature(s) Signed: 10/23/2019 7:20:02 AM By: Carlene Coria RN Entered By: Carlene Coria on 10/18/2019 11:34:34

## 2019-10-25 ENCOUNTER — Other Ambulatory Visit: Payer: Self-pay

## 2019-10-25 ENCOUNTER — Encounter (HOSPITAL_BASED_OUTPATIENT_CLINIC_OR_DEPARTMENT_OTHER): Payer: Medicare Other | Admitting: Internal Medicine

## 2019-10-25 DIAGNOSIS — E11621 Type 2 diabetes mellitus with foot ulcer: Secondary | ICD-10-CM | POA: Diagnosis not present

## 2019-10-27 DIAGNOSIS — I739 Peripheral vascular disease, unspecified: Secondary | ICD-10-CM

## 2019-10-27 DIAGNOSIS — E08621 Diabetes mellitus due to underlying condition with foot ulcer: Secondary | ICD-10-CM

## 2019-10-27 DIAGNOSIS — L97401 Non-pressure chronic ulcer of unspecified heel and midfoot limited to breakdown of skin: Secondary | ICD-10-CM

## 2019-10-28 ENCOUNTER — Other Ambulatory Visit: Payer: Self-pay

## 2019-10-28 ENCOUNTER — Encounter (HOSPITAL_BASED_OUTPATIENT_CLINIC_OR_DEPARTMENT_OTHER): Payer: Medicare Other | Admitting: Internal Medicine

## 2019-10-28 DIAGNOSIS — E11621 Type 2 diabetes mellitus with foot ulcer: Secondary | ICD-10-CM | POA: Diagnosis not present

## 2019-10-28 NOTE — Progress Notes (Signed)
Min, [name] ([id number]) Visit Report for 10/25/2019 Debridement Details Patient Name: Date of Service: [name], [name] 10/25/2019 8:15 AM Medical Record [id number] Patient Account Number: [id number] Date of Birth/Sex: Treating RN: Sep 06, 1950 (68 y.o. [name] Primary Care Provider: Grant [name] Other Clinician: Referring Provider: Treating Provider/Extender:[name], [name], [name] in Treatment: 1 Debridement Performed for Wound #1 Left,Lateral Foot Assessment: Performed By: Physician [name]., MD Debridement Type: Debridement Severity of Tissue Pre Fat layer exposed Debridement: Level of Consciousness (Pre- Awake and Alert procedure): Pre-procedure Verification/Time Out Taken: Yes - 08:50 Start Time: 08:50 Pain Control: Other : benzocaine, 20% Total Area Debrided (L x W): 1.5 (cm) x 1.7 (cm) = 2.55 (cm) Tissue and other material Viable, Non-Viable, Callus, Subcutaneous, Skin: Dermis debrided: Level: Skin/Subcutaneous Tissue Debridement Description: Excisional Instrument: Curette Bleeding: Minimum Hemostasis Achieved: Pressure End Time: 08:51 Procedural Pain: 0 Post Procedural Pain: 0 Response to Treatment: Procedure was tolerated well Level of Consciousness Awake and Alert (Post-procedure): Post Debridement Measurements of Total Wound Length: (cm) 1.5 Width: (cm) 1.7 Depth: (cm) 0.2 Volume: (cm) 0.401 Character of Wound/Ulcer Post Improved Debridement: Severity of Tissue Post Debridement: Fat layer exposed Post Procedure Diagnosis Same as Pre-procedure Electronic Signature(s) Signed: 10/25/2019 5:31:36 PM By: [name] Signed: 10/28/2019 8:43:01 AM By: [name] Ham MD Entered By: [name] on 10/25/2019 09:50:43 -------------------------------------------------------------------------------- HPI Details Patient Name: Date of Service: [name] 10/25/2019 8:15 AM Medical Record  [id number] Patient Account Number: [id number] Date of Birth/Sex: Treating RN: [birth year] (69 y.o. [name] Primary Care Provider: Grant [name] Other Clinician: Referring Provider: Treating Provider/Extender:[name], [name], [name] in Treatment: 1 History of Present Illness HPI Description: ADMISSION 10/18/2019 Patient is a 69 year old man with type 2 diabetes peripheral neuropathy and a Charcot foot. The problem I think began in March 2020 just about a year ago when he was admitted to hospital from 10/19/2018 through 10/24/2018 with a wound on his left lateral foot and intense cellulitis. MRI did not suggest osteomyelitis but did do note a draining sinus. He apparently received a prolonged course of antibiotics. His hemoglobin A1c at that time was 13.9. He was referred to Dr. Sharol [name] where he appears to been followed for probably 5 or 6 months. He was subsequently referred himself to Dr. Cannon [name] of podiatry. He has been offloaded in a surgical shoe with a [name] sole. He has been mostly using Medihoney and episodic debridement of intense surrounding callus. He has been referred here for further management of this difficult the heel wound area and consideration of hyperbaric oxygen The patient had an angiogram in November. He did not have any proximal stenoses but he did have a 70 to 80% peroneal artery occlusion as well as an 80 to 95% stenosis of the anterior tibia which underwent an angioplasty. Follow-up ABIs were done on 07/02/2019 showed an ABI on the right of 1.26 on the left at 1.28 with TBI's of 0.65 and 0.67 respectively. Waveforms were triphasic Past medical history includes type 2 diabetes with neuropathy, PAD and retinopathy, prostate cancer, gastroesophageal reflux disease, chronic kidney disease, small bowel obstruction 10/25/2019. Patient's x-ray of the left foot from last time showed no evidence of osteomyelitis. Chronic Charcot/neuropathic changes as  expected. Notable soft tissue ulcer involving the plantar aspect underlying the forefoot. The patient actually has 2 wounds. One on the mid forefoot limited to breakdown of skin. The larger area is actually on the lateral foot. We have been using silver alginate to both wound areas.  Electronic Signature(s) Signed: 10/28/2019 8:43:01 AM By: [name] Ham MD Entered By: [name] on 10/25/2019 10:03:13 -------------------------------------------------------------------------------- Physical Exam Details Patient Name: Date of Service: [name], [name] 10/25/2019 8:15 AM Medical Record [id number] Patient Account Number: [id number] Date of Birth/Sex: Treating RN: Feb 18, 1951 (69 y.o. [name] Primary Care Provider: Other Clinician: Grant [name] Referring Provider: Treating Provider/Extender:[name], [name], [name] in Treatment: 1 Constitutional Sitting or standing Blood Pressure is within target range for patient.. Pulse regular and within target range for patient.[name] Respirations regular, non-labored and within target range.. Temperature is normal and within the target range for the patient.[name] Appears in no distress. Notes Wound exam; the patient has 2 wounds. One on the left plantar midfoot limited to breakdown of skin. I removed callus from around the edges last week and this seems to really help this there is epithelialization. The deeper areas on the left lateral foot clearly through the complete dermis. This requires debridement with a #5 curette removing necrotic surface debris. The wound cleans up quite nicely hemostasis with direct pressure. Electronic Signature(s) Signed: 10/28/2019 8:43:01 AM By: [name] Ham MD Entered By: [name] on 10/25/2019 10:05:07 -------------------------------------------------------------------------------- Physician Orders Details Patient Name: Date of Service: [name], [name] 10/25/2019 8:15  AM Medical Record [id number] Patient Account Number: [id number] Date of Birth/Sex: Treating RN: 24-Jul-1951 (69 y.o. [name] Primary Care Provider: Grant [name] Other Clinician: Referring Provider: Treating Provider/Extender:Levonne Carreras, [name], [name] in Treatment: 1 Verbal / Phone Orders: No Diagnosis Coding ICD-10 Coding Code Description [phone] Non-pressure chronic ulcer of other part of left foot with fat layer exposed E11.621 Type 2 diabetes mellitus with foot ulcer E11.51 Type 2 diabetes mellitus with diabetic peripheral angiopathy without gangrene M14.672 Charcot's joint, left ankle and foot E11.40 Type 2 diabetes mellitus with diabetic neuropathy, unspecified Follow-up Appointments Return Appointment in 1 week. - Friday Return Appointment in: - Return Tuesday for 1st cast change. Dressing Change Frequency Wound #1 Left,Lateral Foot Do not change entire dressing for one week. Wound #2 Left,Plantar Foot Do not change entire dressing for one week. Wound Cleansing Wound #1 Left,Lateral Foot May shower with protection. - cast protector Wound #2 Left,Plantar Foot May shower with protection. - cast protector Primary Wound Dressing Wound #1 Left,Lateral Foot Calcium Alginate with Silver Wound #2 Left,Plantar Foot Calcium Alginate with Silver Secondary Dressing Wound #1 Left,Lateral Foot Foam - donut Dry Gauze - tape Wound #2 Left,Plantar Foot Foam - donut Dry Gauze - tape Off-Loading Total Contact Cast to Left [place] skilled nursing for wound care. - [facility], HOLD for wound care due to cast placed on. Electronic Signature(s) Signed: 10/25/2019 5:31:36 PM By: [name] Signed: 10/28/2019 8:43:01 AM By: [name] Ham MD Entered By: [name] on 10/25/2019 09:02:36 -------------------------------------------------------------------------------- Problem List Details Patient  Name: Date of Service: [name], [name] 10/25/2019 8:15 AM Medical Record [id number] Patient Account Number: [id number] Date of Birth/Sex: Treating RN: 10-28-1950 (69 y.o. [name] Primary Care Provider: Grant [name] Other Clinician: Referring Provider: Treating Provider/Extender:Dale Strausser, [name], [name] in Treatment: 1 Active Problems ICD-10 Evaluated Encounter Code Description Active Date Today Diagnosis L97.522 Non-pressure chronic ulcer of other part of left foot 10/18/2019 No Yes with fat layer exposed E11.51 Type 2 diabetes mellitus with diabetic peripheral 10/18/2019 No Yes angiopathy without gangrene E11.621 Type 2 diabetes mellitus with foot ulcer 10/18/2019 No Yes L97.521 Non-pressure chronic ulcer of other part of left foot 10/25/2019 No Yes limited to breakdown of skin  P29.518 Charcot's joint, left ankle and foot 10/18/2019 No Yes E11.40 Type 2 diabetes mellitus with diabetic neuropathy, 10/18/2019 No Yes unspecified Inactive Problems Resolved Problems Electronic Signature(s) Signed: 10/28/2019 8:43:01 AM By: [name] Ham MD Entered By: [name] on 10/25/2019 10:01:45 -------------------------------------------------------------------------------- Progress Note Details Patient Name: Date of Service: [name] 10/25/2019 8:15 AM Medical Record [id number] Patient Account Number: [id number] Date of Birth/Sex: Treating RN: [birth year] (69 y.o. [name] Primary Care Provider: Grant [name] Other Clinician: Referring Provider: Treating Provider/Extender:Tymere Depuy, [name], [name] in Treatment: 1 Subjective History of Present Illness (HPI) ADMISSION 10/18/2019 Patient is a 69 year old man with type 2 diabetes peripheral neuropathy and a Charcot foot. The problem I think began in March 2020 just about a year ago when he was admitted to hospital from 10/19/2018 through 10/24/2018 with a wound on his left  lateral foot and intense cellulitis. MRI did not suggest osteomyelitis but did do note a draining sinus. He apparently received a prolonged course of antibiotics. His hemoglobin A1c at that time was 13.9. He was referred to Dr. Sharol [name] where he appears to been followed for probably 5 or 6 months. He was subsequently referred himself to Dr. Cannon [name] of podiatry. He has been offloaded in a surgical shoe with a [name] sole. He has been mostly using Medihoney and episodic debridement of intense surrounding callus. He has been referred here for further management of this difficult the heel wound area and consideration of hyperbaric oxygen The patient had an angiogram in November. He did not have any proximal stenoses but he did have a 70 to 80% peroneal artery occlusion as well as an 80 to 95% stenosis of the anterior tibia which underwent an angioplasty. Follow-up ABIs were done on 07/02/2019 showed an ABI on the right of 1.26 on the left at 1.28 with TBI's of 0.65 and 0.67 respectively. Waveforms were triphasic Past medical history includes type 2 diabetes with neuropathy, PAD and retinopathy, prostate cancer, gastroesophageal reflux disease, chronic kidney disease, small bowel obstruction 10/25/2019. Patient's x-ray of the left foot from last time showed no evidence of osteomyelitis. Chronic Charcot/neuropathic changes as expected. Notable soft tissue ulcer involving the plantar aspect underlying the forefoot. The patient actually has 2 wounds. One on the mid forefoot limited to breakdown of skin. The larger area is actually on the lateral foot. We have been using silver alginate to both wound areas. Objective Constitutional Sitting or standing Blood Pressure is within target range for patient.. Pulse regular and within target range for patient.[name] Respirations regular, non-labored and within target range.. Temperature is normal and within the target range for the patient.[name] Appears in no distress. Vitals  Time Taken: 8:36 AM, Height: 67 in, Weight: 173 lbs, BMI: 27.1, Temperature: 98.6 F, Pulse: 67 bpm, Respiratory Rate: 18 breaths/min, Blood Pressure: 152/48 mmHg. General Notes: Wound exam; the patient has 2 wounds. One on the left plantar midfoot limited to breakdown of skin. I removed callus from around the edges last week and this seems to really help this there is epithelialization. ooThe deeper areas on the left lateral foot clearly through the complete dermis. This requires debridement with a #5 curette removing necrotic surface debris. The wound cleans up quite nicely hemostasis with direct pressure. Integumentary (Hair, Skin) Wound #1 status is Open. Original cause of wound was Gradually Appeared. The wound is located on the Left,Lateral Foot. The wound measures 1.5cm length x 1.7cm width x 0.2cm depth; 2.003cm^2 area and 0.401cm^3 volume. There is Fat Layer (Subcutaneous  Tissue) Exposed exposed. There is no tunneling noted, however, there is undermining starting at 7:00 and ending at 11:00 with a maximum distance of 0.8cm. There is a medium amount of serosanguineous drainage noted. The wound margin is flat and intact. There is large (67-100%) red, pink, pale granulation within the wound bed. There is a small (1-33%) amount of necrotic tissue within the wound bed including Adherent Slough. Wound #2 status is Open. Original cause of wound was Gradually Appeared. The wound is located on the [place]. The wound measures 0.7cm length x 1.4cm width x 0.2cm depth; 0.77cm^2 area and 0.154cm^3 volume. There is Fat Layer (Subcutaneous Tissue) Exposed exposed. There is no tunneling or undermining noted. There is a medium amount of serosanguineous drainage noted. The wound margin is flat and intact. There is medium (34-66%) pink granulation within the wound bed. There is a medium (34-66%) amount of necrotic tissue within the wound bed including Adherent Slough. Assessment Active  Problems ICD-10 Non-pressure chronic ulcer of other part of left foot with fat layer exposed Type 2 diabetes mellitus with diabetic peripheral angiopathy without gangrene Type 2 diabetes mellitus with foot ulcer Non-pressure chronic ulcer of other part of left foot limited to breakdown of skin Charcot's joint, left ankle and foot Type 2 diabetes mellitus with diabetic neuropathy, unspecified Procedures Wound #1 Pre-procedure diagnosis of Wound #1 is a Diabetic Wound/Ulcer of the Lower Extremity located on the Left,Lateral Foot .Severity of Tissue Pre Debridement is: Fat layer exposed. There was a Excisional Skin/Subcutaneous Tissue Debridement with a total area of 2.55 sq cm performed by [name]., MD. With the following instrument(s): Curette to remove Viable and Non-Viable tissue/material. Material removed includes Callus, Subcutaneous Tissue, and Skin: Dermis after achieving pain control using Other (benzocaine, 20%). No specimens were taken. A time out was conducted at 08:50, prior to the start of the procedure. A Minimum amount of bleeding was controlled with Pressure. The procedure was tolerated well with a pain level of 0 throughout and a pain level of 0 following the procedure. Post Debridement Measurements: 1.5cm length x 1.7cm width x 0.2cm depth; 0.401cm^3 volume. Character of Wound/Ulcer Post Debridement is improved. Severity of Tissue Post Debridement is: Fat layer exposed. Post procedure Diagnosis Wound #1: Same as Pre-Procedure Pre-procedure diagnosis of Wound #1 is a Diabetic Wound/Ulcer of the Lower Extremity located on the Left,Lateral Foot . There was a Total Contact Cast Procedure by [name]., MD. Post procedure Diagnosis Wound #1: Same as Pre-Procedure Wound #2 Pre-procedure diagnosis of Wound #2 is a Diabetic Wound/Ulcer of the Lower Extremity located on the [place] . There was a Total Contact Cast Procedure by [name].,  MD. Post procedure Diagnosis Wound #2: Same as Pre-Procedure Plan Follow-up Appointments: Return Appointment in 1 week. - Friday Return Appointment in: - Return Tuesday for 1st cast change. Dressing Change Frequency: Wound #1 Left,Lateral Foot: Do not change entire dressing for one week. Wound #2 Left,Plantar Foot: Do not change entire dressing for one week. Wound Cleansing: Wound #1 Left,Lateral Foot: May shower with protection. - cast protector Wound #2 Left,Plantar Foot: May shower with protection. - cast protector Primary Wound Dressing: Wound #1 Left,Lateral Foot: Calcium Alginate with Silver Wound #2 Left,Plantar Foot: Calcium Alginate with Silver Secondary Dressing: Wound #1 Left,Lateral Foot: Foam - donut Dry Gauze - tape Wound #2 Left,Plantar Foot: Foam - donut Dry Gauze - tape Off-Loading: Total Contact Cast to Left Lower Extremity Home Health: [facility] skilled nursing for  wound care. - [facility], HOLD for wound care due to cast placed on. 1. Silver alginate to both wounds. 2. The lateral foot wound is actually the larger wound here that underwent debridement. 3. Both wounds dressed with silver alginate and put in a total contact cast 4. I had some concerns about his balance although he seemed to do well with his walker with a total contact cast. He will be back next week for the obligatory 3-day change Electronic Signature(s) Signed: 10/28/2019 8:43:01 AM By: [name] Ham MD Entered By: [name] on 10/25/2019 10:06:07 -------------------------------------------------------------------------------- Total Contact Cast Details Patient Name: Date of Service: [name], [name] 10/25/2019 8:15 AM Medical Record [id number] Patient Account Number: [id number] Date of Birth/Sex: Treating RN: 08-08-1951 (69 y.o. [name] Primary Care Provider: Grant [name] Other Clinician: Referring Provider: Treating Provider/Extender:Shoji Pertuit,  [name], [name] in Treatment: 1 Total Contact Cast Applied for Wound Assessment: Wound #1 Left,Lateral Foot Performed By: Physician [name]., MD Post Procedure Diagnosis Same as Pre-procedure Electronic Signature(s) Signed: 10/25/2019 5:31:36 PM By: [name] Signed: 10/28/2019 8:43:01 AM By: [name] Ham MD Entered By: [name] on 10/25/2019 09:05:01 -------------------------------------------------------------------------------- Total Contact Cast Details Patient Name: Date of Service: [name], [name] 10/25/2019 8:15 AM Medical Record [id number] Patient Account Number: [id number] Date of Birth/Sex: Treating RN: 02/27/1951 (69 y.o. [name] Primary Care Provider: Grant [name] Other Clinician: Referring Provider: Treating Provider/Extender:Illianna Paschal, [name], [name] in Treatment: 1 Total Contact Cast Applied for Wound Assessment: Wound #2 Left,Plantar Foot Performed By: Physician [name]., MD Post Procedure Diagnosis Same as Pre-procedure Electronic Signature(s) Signed: 10/28/2019 8:43:01 AM By: [name] Ham MD Entered By: [name] on 10/25/2019 10:00:16 -------------------------------------------------------------------------------- SuperBill Details Patient Name: Date of Service: [name] 10/25/2019 Medical Record [id number] Patient Account Number: [id number] Date of Birth/Sex: Treating RN: 1951-04-24 (69 y.o. [name] Primary Care Provider: Grant [name] Other Clinician: Referring Provider: Treating Provider/Extender:Nissi Doffing, [name], [name] in Treatment: 1 Diagnosis Coding ICD-10 Codes Code Description [phone] Non-pressure chronic ulcer of other part of left foot with fat layer exposed E11.51 Type 2 diabetes mellitus with diabetic peripheral angiopathy without gangrene E11.621 Type 2 diabetes mellitus with foot ulcer L97.521 Non-pressure  chronic ulcer of other part of left foot limited to breakdown of skin M14.672 Charcot's joint, left ankle and foot E11.40 Type 2 diabetes mellitus with diabetic neuropathy, unspecified Facility Procedures CPT4 Code Description: [id number] 11042 - DEB SUBQ TISSUE 20 SQ CM/< ICD-10 Diagnosis Description L97.522 Non-pressure chronic ulcer of other part of left foot with f Modifier: at layer exp Quantity: 1 osed Physician Procedures CPT4 Code Description: [id number] 11042 - WC PHYS SUBQ TISS 20 SQ CM ICD-10 Diagnosis Description L97.522 Non-pressure chronic ulcer of other part of left foot with Modifier: fat layer expo Quantity: 1 sed CPT4 Code Description: [id number] - WC PHYS APPLY TOTAL CONTACT CAST ICD-10 Diagnosis Description L97.521 Non-pressure chronic ulcer of other part of left foot limit Modifier: 59 ed to breakdow Quantity: 1 n of skin Electronic Signature(s) Signed: 10/28/2019 8:43:01 AM By: [name] Ham MD Entered By: [name] on 10/25/2019 10:07:39

## 2019-10-28 NOTE — Progress Notes (Addendum)
Toth, Pettibone ([id number]) Visit Report for 10/25/2019 Arrival Information Details Patient Name: Date of Service: [name], [name] 10/25/2019 8:15 AM Medical Record [id number] Patient Account Number: [id number] Date of Birth/Sex: Treating RN: 1950/08/28 (68 y.o. [name]) Carlene Coria Primary Care Keila Turan: [name] Other Clinician: Referring Denaya Horn: Treating Druanne Bosques/Extender:Robson, Wayland Denis, Clelia Schaumann in Treatment: 1 Visit Information History Since Last Visit All ordered tests and consults were completed: No Patient Arrived: [name] Added or deleted any medications: No Arrival Time: 08:36 Any new allergies or adverse reactions: No Accompanied By: self Had a fall or experienced change in No Transfer Assistance: None activities of daily living that may affect Patient Identification Verified: Yes risk of falls: Secondary Verification Process Yes Signs or symptoms of abuse/neglect since last No Completed: visito Patient Requires Transmission- No Hospitalized since last visit: No Based Precautions: Implantable device outside of the clinic excluding No Patient Has Alerts: Yes cellular tissue based products placed in the center Patient Alerts: Patient on Blood since last visit: Thinner Has Dressing in Place as Prescribed: Yes Pain Present Now: No Electronic Signature(s) Signed: 10/25/2019 5:30:50 PM By: Carlene Coria RN Entered By: Carlene Coria on 10/25/2019 08:36:37 -------------------------------------------------------------------------------- Encounter Discharge Information Details Patient Name: Date of Service: [name] 10/25/2019 8:15 AM Medical Record [id number] Patient Account Number: [id number] Date of Birth/Sex: Treating RN: 1950-11-29 (69 y.o. [name] Primary Care Linzie Criss: [name] Other Clinician: Referring Donnella Morford: Treating Kieron Kantner/Extender:Robson, Wayland Denis, Clelia Schaumann in Treatment: 1 Encounter  Discharge Information Items Post Procedure Vitals Discharge Condition: Stable Temperature (F): 98.6 Ambulatory Status: Walker Pulse (bpm): 67 Discharge Destination: Home Respiratory Rate (breaths/min): 18 Transportation: Private Auto Blood Pressure (mmHg): 152/48 Accompanied By: self Schedule Follow-up Appointment: Yes Clinical Summary of Care: Electronic Signature(s) Signed: 10/25/2019 5:45:00 PM By: Deon Pilling Entered By: Deon Pilling on 10/25/2019 09:34:24 -------------------------------------------------------------------------------- Lower Extremity Assessment Details Patient Name: Date of Service: [name] 10/25/2019 8:15 AM Medical Record [id number] Patient Account Number: [id number] Date of Birth/Sex: Treating RN: [birth year] (68 y.o. [name]) Carlene Coria Primary Care Chin Wachter: [name] Other Clinician: Referring Renae Mottley: Treating Chardae Mulkern/Extender:Robson, Wayland Denis, Clelia Schaumann in Treatment: 1 Edema Assessment Assessed: [Left: No] [Right: No] E[Left: dema] [Right: :] Calf Left: Right: Point of Measurement: 41 cm From Medial Instep 31 cm cm Ankle Left: Right: Point of Measurement: 9 cm From Medial Instep 21 cm cm Electronic Signature(s) Signed: 10/25/2019 5:30:50 PM By: Carlene Coria RN Entered By: Carlene Coria on 10/25/2019 08:37:31 -------------------------------------------------------------------------------- Multi Wound Chart Details Patient Name: Date of Service: [name] 10/25/2019 8:15 AM Medical Record [id number] Patient Account Number: [id number] Date of Birth/Sex: Treating RN: [birth year] (69 y.o. 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] [2:Lower Extremity] Comorbid History: [1:Congestive Heart Failure, Congestive Heart Failure, N/A Hypertension, Peripheral Hypertension, Peripheral Venous Disease, Type II Venous Disease, Type II Diabetes] [2:Diabetes] Date Acquired: [1:08/15/2018] [2:10/15/2018] [N/A:N/A] Weeks of Treatment: [1:1] [2:1] [N/A:N/A] Wound Status: [1:Open] [2:Open] [N/A:N/A] Measurements L x W x D 1.5x1.7x0.2 [2:0.7x1.4x0.2] [N/A:N/A] (cm) Area (cm) : [1:2.003] [2:0.77] [N/A:N/A] Volume (cm) : [1:0.401] [2:0.154] [N/A:N/A] % Reduction in Area: [1:29.10%] [2:-133.30%] [N/A:N/A] %  Reduction in Volume: 71.60% [2:-133.30%] [N/A:N/A] Starting Position 1 [1:7] (o'clock): Ending Position 1 [1:11] (o'clock): Maximum Distance 1 [1:0.8] (cm): Undermining: [1:Yes] [2:No] [N/A:N/A] Classification: [1:Grade 2] [2:Grade 2] [N/A:N/A] Exudate Amount: [1:Medium] [2:Medium] [N/A:N/A] Exudate Type: [1:Serosanguineous] [2:Serosanguineous] [N/A:N/A] Exudate Color: [1:red, brown] [2:red, brown] [N/A:N/A] Wound Margin: [1:Flat and Intact] [2:Flat and Intact] [N/A:N/A] Granulation Amount: [1:Large (67-100%)] [2:Medium (34-66%)] [N/A:N/A] Granulation Quality: [1:Red, Pink, Pale] [2:Pink] [N/A:N/A] Necrotic Amount: [1:Small (1-33%)] [2:Medium (34-66%)] [N/A:N/A] Exposed Structures: [1:Fat Layer (Subcutaneous Fat Layer (Subcutaneous N/A Tissue) Exposed: Yes Fascia: No Tendon: No Muscle: No Joint: No Bone: No] [2:Tissue) Exposed: Yes Fascia: No Tendon: No Muscle: No Joint: No Bone: No] Epithelialization: [1:None] [2:None] [N/A:N/A] Debridement: [1:Debridement - Excisional N/A] [N/A:N/A] Pre-procedure  [1:08:50] [2:N/A] [N/A:N/A] Verification/Time Out Taken: Pain Control: [1:Other] [2:N/A] [N/A:N/A] Tissue Debrided: [1:Callus, Subcutaneous] [2:N/A] [N/A:N/A] Level: [1:Skin/Subcutaneous Tissue N/A] [N/A:N/A] Debridement Area (sq cm):2.55 [2:N/A] [N/A:N/A] Instrument: [1:Curette] [2:N/A] [N/A:N/A] Bleeding: [1:Minimum] [2:N/A] [N/A:N/A] Hemostasis Achieved: [1:Pressure] [2:N/A] [N/A:N/A] Procedural Pain: [1:0] [2:N/A] [N/A:N/A] Post Procedural Pain: [1:0] [2:N/A] [N/A:N/A] Debridement Treatment [1:Procedure was tolerated] [2:N/A] [N/A:N/A] Response: [1:well] Post Debridement [1:1.5x1.7x0.2] [2:N/A] [N/A:N/A] Measurements L x W x D (cm) Post Debridement [1:0.401] [2:N/A] [N/A:N/A] Volume: (cm) Procedures Performed: [1:Debridement Total Contact Cast] [N/A:N/A] Treatment Notes Wound #1 (Left, Lateral Foot) 1. Cleanse With Wound Cleanser 3. Primary Dressing Applied Calcium Alginate Ag 4. Secondary Dressing Dry Gauze Foam 5. Secured With Medipore tape 7. Footwear/Offloading device applied Total Contact Cast Notes educated patient on TCC and how to care for it. Patient in agreement. TCC applied by MD. Wound #2 (Left, Plantar Foot) 1. Cleanse With Wound Cleanser 3. Primary Dressing Applied Calcium Alginate Ag 4. Secondary Dressing Dry Gauze Foam 5. Secured With Medipore tape 7. Footwear/Offloading device applied Total Contact Cast Notes educated patient on TCC and how to care for it. Patient in agreement. TCC applied by MD. Electronic Signature(s) Signed: 10/25/2019 5:31:36 PM By: Kela Millin Signed: 10/28/2019 8:43:01 AM By: Linton Ham MD Entered By: Linton Ham on 10/25/2019 10:02:06 -------------------------------------------------------------------------------- [name] Details Patient Name: Date of Service: HAMZEH, TALL 10/25/2019 8:15 AM Medical Record [id number] Patient Account Number: [id number] Date of  Birth/Sex: Treating RN: 19-Apr-1951 (69 y.o. [name] Primary Care Teneisha Gignac: [name] Other Clinician: Referring Alleen Kehm: Treating Timberlyn Pickford/Extender:Robson, Wayland Denis, Clelia Schaumann in Treatment: 1 Active Inactive Nutrition Nursing Diagnoses: Impaired glucose control: actual or potential Potential for alteratiion in Nutrition/Potential for imbalanced nutrition Goals: Patient/caregiver will maintain therapeutic glucose control Date Initiated: 10/18/2019 Target Resolution Date: 11/15/2019 Goal Status: Active Interventions: Assess HgA1c results as ordered upon admission and as needed Assess patient nutrition upon admission and as needed per policy Provide education on elevated blood sugars and impact on wound healing Treatment Activities: Patient referred to Primary Care Physician for further nutritional evaluation : 10/18/2019 Notes: Wound/Skin Impairment Nursing Diagnoses: Impaired tissue integrity Knowledge deficit related to ulceration/compromised skin integrity Goals: Patient/caregiver will verbalize understanding of skin care regimen Date Initiated: 10/18/2019 Target Resolution Date: 11/15/2019 Goal Status: Active Ulcer/skin breakdown will have a volume reduction of 30% by week 4 Date Initiated: 10/18/2019 Target Resolution Date: 11/15/2019 Goal Status: Active Interventions: Assess patient/caregiver ability to obtain necessary supplies Assess patient/caregiver ability to perform ulcer/skin care regimen upon admission and as needed Assess ulceration(s) every visit Provide education on ulcer and skin care Treatment Activities: Skin care regimen initiated : 10/18/2019 Topical wound management initiated : 10/18/2019 Notes: Electronic Signature(s) Signed: 10/25/2019 5:31:36 PM By: Kela Millin Entered By: Kela Millin on 10/25/2019 08:43:57 -------------------------------------------------------------------------------- Pain  Assessment Details Patient  Name: Date of Service: SIGIFREDO, PIGNATO 10/25/2019 8:15 AM Medical Record [id number] Patient Account Number: [id number] Date of Birth/Sex: Treating RN: [birth year] (68 y.o. [name]) Carlene Coria Primary Care Anaiya Wisinski: [name] Other Clinician: Referring Puneet Selden: Treating Doneshia Hill/Extender:Robson, Wayland Denis, Clelia Schaumann in Treatment: 1 Active Problems Location of Pain Severity and Description of Pain Patient Has Paino No Site Locations Pain Management and Medication Current Pain Management: Electronic Signature(s) Signed: 10/25/2019 5:30:50 PM By: Carlene Coria RN Entered By: Carlene Coria on 10/25/2019 08:37:20 -------------------------------------------------------------------------------- Patient/Caregiver Education Details Patient Name: Gram, Cruz L. Date of Service: 3/12/2021andnbsp8:15 AM Medical Record [id number] Patient Account Number: [id number] Date of Birth/Gender: 25-May-1951 (69 y.o. M) Treating RN: Kela Millin Primary Care Physician: [name] Other Clinician: Referring Physician: Treating Physician/Extender:Robson, Wayland Denis, Clelia Schaumann in Treatment: 1 Education Assessment Education Provided To: Patient Education Topics Provided Elevated Blood Sugar/ Impact on Healing: Methods: Explain/Verbal Responses: State content correctly Wound/Skin Impairment: Methods: Explain/Verbal Responses: State content correctly Electronic Signature(s) Signed: 10/25/2019 5:31:36 PM By: Kela Millin Entered By: Kela Millin on 10/25/2019 08:44:17 -------------------------------------------------------------------------------- Wound Assessment Details Patient Name: Date of Service: [name] 10/25/2019 8:15 AM Medical Record [id number] Patient Account Number: [id number] Date of Birth/Sex: Treating RN: 11-19-1950 (68 y.o. [name]) Carlene Coria Primary Care Satoshi Kalas: [name] Other Clinician: Referring Damarian Priola:  Treating Verdun Rackley/Extender:Robson, Wayland Denis, Clelia Schaumann in Treatment: 1 Wound Status Wound Number: 1 Primary Diabetic Wound/Ulcer of the Lower Extremity Etiology: Wound Location: Left Foot - Lateral Wound Open Wounding Event: Gradually Appeared Status: Date Acquired: 08/15/2018 Comorbid Congestive Heart Failure, Hypertension, Weeks Of Treatment: 1 History: Peripheral Venous Disease, Type II Diabetes Clustered Wound: No Photos Wound Measurements Length: (cm) 1.5 Width: (cm) 1.7 Depth: (cm) 0.2 Area: (cm) 2.003 Volume: (cm) 0.401 % Reduction in Area: 29.1% % Reduction in Volume: 71.6% Epithelialization: None Tunneling: No Undermining: Yes Starting Position (o'clock): 7 Ending Position (o'clock): 11 Maximum Distance: (cm) 0.8 Wound Description Classification: Grade 2 Wound Margin: Flat and Intact Exudate Amount: Medium Exudate Type: Serosanguineous Exudate Color: red, brown Wound Bed Granulation Amount: Large (67-100%) Granulation Quality: Red, Pink, Pale Necrotic Amount: Small (1-33%) Necrotic Quality: Adherent Slough Foul Odor After Cleansing: No Slough/Fibrino Yes Exposed Structure Fascia Exposed: No Fat Layer (Subcutaneous Tissue) Exposed: Yes Tendon Exposed: No Muscle Exposed: No Joint Exposed: No Bone Exposed: No Treatment Notes Wound #1 (Left, Lateral Foot) 1. Cleanse With Wound Cleanser 3. Primary Dressing Applied Calcium Alginate Ag 4. Secondary Dressing Dry Gauze Foam 5. Secured With Medipore tape 7. Footwear/Offloading device applied Total Contact Cast Notes educated patient on TCC and how to care for it. Patient in agreement. TCC applied by MD. Electronic Signature(s) Signed: 10/28/2019 5:11:29 PM By: Mikeal Hawthorne EMT/HBOT Signed: 10/28/2019 5:26:01 PM By: Carlene Coria RN Previous Signature: 10/25/2019 5:30:50 PM Version By: Carlene Coria RN Entered By: Mikeal Hawthorne on 10/28/2019  13:50:17 -------------------------------------------------------------------------------- Wound Assessment Details Patient Name: Date of Service: JAKORY, MATSUO 10/25/2019 8:15 AM Medical Record [id number] Patient Account Number: [id number] Date of Birth/Sex: Treating RN: 11-07-1950 (68 y.o. [name] Primary Care Nataki Mccrumb: [name] Other Clinician: Referring Laney Bagshaw: Treating Jocelyn Nold/Extender:Robson, Wayland Denis, Clelia Schaumann in Treatment: 1 Wound Status Wound Number: 2 Primary Diabetic Wound/Ulcer of the Lower Extremity Etiology: Wound Location: Left Foot - Plantar Wound Open Wounding Event: Gradually Appeared Status: Date Acquired: 10/15/2018 Comorbid Congestive Heart Failure, Hypertension, Weeks Of Treatment: 1 History: Peripheral Venous Disease, Type II Diabetes Clustered Wound: No Photos Wound Measurements Length: (cm) 0.7 % Reduction in Are  Width: (cm) 1.4 % Reduction in Vol Depth: (cm) 0.2 Epithelialization: Area: (cm) 0.77 Tunneling: Volume: (cm) 0.154 Undermining: Wound Description Classification: Grade 2 Foul Odor After Cl Wound Margin: Flat and Intact Slough/Fibrino Exudate Amount: Medium Exudate Type: Serosanguineous Exudate Color: red, brown Wound Bed Granulation Amount: Medium (34-66%) E Granulation Quality: Pink Fascia Exposed: Necrotic Amount: Medium (34-66%) Fat Layer [place] Necrotic Quality: Adherent Slough Tendon Exposed: Muscle Exposed: Joint Exposed: Bone Exposed: eansing: No Yes xposed Structure No neous Tissue) Exposed: Yes No No No No a: -133.3% ume: -133.3% None No No Treatment Notes Wound #2 (Left, Plantar Foot) 1. Cleanse With Wound Cleanser 3. Primary Dressing Applied Calcium Alginate Ag 4. Secondary Dressing Dry Gauze Foam 5. Secured With Medipore tape 7. Footwear/Offloading device applied Total Contact Cast Notes educated patient on TCC and how to care for it. Patient in  agreement. TCC applied by MD. Electronic Signature(s) Signed: 10/28/2019 5:11:29 PM By: Mikeal Hawthorne EMT/HBOT Signed: 10/28/2019 5:26:01 PM By: Carlene Coria RN Previous Signature: 10/25/2019 5:30:50 PM Version By: Carlene Coria RN Entered By: Mikeal Hawthorne on 10/28/2019 13:46:59 -------------------------------------------------------------------------------- Vitals Details Patient Name: Date of Service: SHAFIQ, LARCH 10/25/2019 8:15 AM Medical Record [id number] Patient Account Number: [id number] Date of Birth/Sex: Treating RN: 01-26-1951 (68 y.o. [name]) Carlene Coria Primary Care Nykerria Macconnell: [name] Other Clinician: Referring Jonisha Kindig: Treating Adelia Baptista/Extender:Robson, Wayland Denis, Clelia Schaumann in Treatment: 1 Vital Signs Time Taken: 08:36 Temperature (F): 98.6 Height (in): 67 Pulse (bpm): 67 Weight (lbs): 173 Respiratory Rate (breaths/min): 18 Body Mass Index (BMI): 27.1 Blood Pressure (mmHg): 152/48 Reference Range: 80 - 120 mg / dl Electronic Signature(s) Signed: 10/25/2019 5:30:50 PM By: Carlene Coria RN Entered By: Carlene Coria on 10/25/2019 08:37:07

## 2019-10-29 ENCOUNTER — Ambulatory Visit: Payer: Self-pay | Admitting: Family Medicine

## 2019-10-29 ENCOUNTER — Encounter (HOSPITAL_BASED_OUTPATIENT_CLINIC_OR_DEPARTMENT_OTHER): Payer: Medicare Other | Admitting: Internal Medicine

## 2019-10-29 NOTE — Progress Notes (Signed)
[place] ([id number]) Visit Report for 10/28/2019 Arrival Information Details Patient Name: Date of Service: JOUSHA, SCHWANDT 10/28/2019 10:45 AM Medical Record [id number] Patient Account Number: [id number] Date of Birth/Sex: Treating RN: 1950-12-10 (69 y.o. [name] Primary Care Marthann Abshier: [name] Other Clinician: Referring Sly Parlee: Treating Jadeyn Hargett/Extender:Robson, Wayland Denis, Clelia Schaumann in Treatment: 1 Visit Information History Since Last Visit Added or deleted any medications: No Patient Arrived: [name] Any new allergies or adverse reactions: No Arrival Time: 09:40 Had a fall or experienced change in No Accompanied By: self activities of daily living that may affect Transfer Assistance: None risk of falls: Patient Identification Verified: Yes Signs or symptoms of abuse/neglect since No Secondary Verification Process Yes last visito Completed: Hospitalized since last visit: No Patient Requires Transmission- No Implantable device outside of the clinic No Based Precautions: excluding Patient Has Alerts: Yes cellular tissue based products placed in Patient Alerts: Patient on Blood the center Thinner since last visit: Has Dressing in Place as Prescribed: Yes Has Footwear/Offloading in Place as Yes Prescribed: Left: Total Contact Cast Pain Present Now: No Electronic Signature(s) Signed: 10/28/2019 5:25:45 PM By: Kela Millin Entered By: Kela Millin on 10/28/2019 09:42:35 -------------------------------------------------------------------------------- Encounter Discharge Information Details Patient Name: Date of Service: [name] 10/28/2019 10:45 AM Medical Record [id number] Patient Account Number: [id number] Date of Birth/Sex: Treating RN: 07-20-1951 (69 y.o. [name] Primary Care Trevor Duty: [name] Other Clinician: Referring Rayshawn Maney: Treating Prerna Harold/Extender:Robson,  Wayland Denis, Clelia Schaumann in Treatment: 1 Encounter Discharge Information Items Discharge Condition: Stable Ambulatory Status: Walker Discharge Destination: Home Transportation: Private Auto Accompanied By: self Schedule Follow-up Appointment: Yes Clinical Summary of Care: Electronic Signature(s) Signed: 10/28/2019 5:25:09 PM By: Deon Pilling Entered By: Deon Pilling on 10/28/2019 10:17:02 -------------------------------------------------------------------------------- Lower Extremity Assessment Details Patient Name: Date of Service: MONTREAL, STEIDLE 10/28/2019 10:45 AM Medical Record [id number] Patient Account Number: [id number] Date of Birth/Sex: Treating RN: Aug 24, 1950 (69 y.o. [name] Primary Care Crickett Abbett: [name] Other Clinician: Referring Yvana Samonte: Treating Elvena Oyer/Extender:Robson, Wayland Denis, Clelia Schaumann in Treatment: 1 Edema Assessment Assessed: [Left: No] [Right: No] Edema: [Left: N] [Right: o] Calf Left: Right: Point of Measurement: 41 cm From Medial Instep 31 cm cm Ankle Left: Right: Point of Measurement: 9 cm From Medial Instep 21 cm cm Vascular Assessment Pulses: Dorsalis Pedis Palpable: [Left:Yes] Electronic Signature(s) Signed: 10/28/2019 5:25:45 PM By: Kela Millin Entered By: Kela Millin on 10/28/2019 09:50:44 -------------------------------------------------------------------------------- Multi Wound Chart Details Patient Name: Date of Service: [name] 10/28/2019 10:45 AM Medical Record [id number] Patient Account Number: [id number] Date of Birth/Sex: Treating RN: [birth year] (69 y.o. [name] Primary Care Liyanna Cartwright: [name] Other Clinician: Referring [place] Souter: Treating Sidnie Swalley/Extender:Robson, Wayland Denis, Clelia Schaumann in Treatment: 1 Vital Signs Height(in): 5 Pulse(bpm): 38 Weight(lbs): 173 Blood Pressure(mmHg): 143/61 Body Mass Index(BMI):  27 Temperature(F): 98 Respiratory 16 Rate(breaths/min): Photos: [1:No Photos] [2:No Photos] [N/A:N/A] Wound Location: [1:Left Foot - Lateral] [2:Left Foot - Plantar] [N/A:N/A] Wounding Event: [1:Gradually Appeared] [2:Gradually Appeared] [N/A:N/A] Primary Etiology: [1:Diabetic Wound/Ulcer of the Diabetic Wound/Ulcer of the N/A Lower Extremity] [2:Lower Extremity] Comorbid History: [1:Congestive Heart Failure, Congestive Heart Failure, N/A Hypertension, Peripheral Hypertension, Peripheral Venous Disease, Type II Venous Disease, Type II Diabetes] [2:Diabetes] Date Acquired: [1:08/15/2018] [2:10/15/2018] [N/A:N/A] Weeks of Treatment: [1:1] [2:1] [N/A:N/A] Wound Status: [1:Open] [2:Open] [N/A:N/A] Measurements L x W x D 1.5x1.7x0.2 [2:0.7x1.4x0.2] [N/A:N/A] (cm) Area (cm) : [1:2.003] [2:0.77] [N/A:N/A] Volume (cm) : [1:0.401] [2:0.154] [N/A:N/A] % Reduction in Area: [1:29.10%] [2:-133.30%] [N/A:N/A] % Reduction in Volume: 71.60% [2:-133.30%] [  N/A:N/A] Classification: [1:Grade 2] [2:Grade 2] [N/A:N/A] Exudate Amount: [1:Medium] [2:Medium] [N/A:N/A] Exudate Type: [1:Serosanguineous] [2:Serosanguineous] [N/A:N/A] Exudate Color: [1:red, brown] [2:red, brown] [N/A:N/A] Wound Margin: [1:Flat and Intact] [2:Flat and Intact] [N/A:N/A] Granulation Amount: [1:Large (67-100%)] [2:Medium (34-66%)] [N/A:N/A] Granulation Quality: [1:Red, Pink, Pale] [2:Pink] [N/A:N/A] Necrotic Amount: [1:Small (1-33%)] [2:Medium (34-66%)] [N/A:N/A] Exposed Structures: [1:Fat Layer (Subcutaneous Fat Layer (Subcutaneous N/A Tissue) Exposed: Yes Fascia: No Tendon: No Muscle: No Joint: No Bone: No] [2:Tissue) Exposed: Yes Fascia: No Tendon: No Muscle: No Joint: No Bone: No] Epithelialization: [1:None] [2:None] [N/A:N/A] Procedures Performed: Total Contact Cast [2:Total Contact Cast] [N/A:N/A] Treatment Notes Wound #1 (Left, Lateral Foot) [1:1. Cleanse With Wound Cleanser Soap and water 3. Primary Dressing Applied Calcium  Alginate Ag 4. Secondary Dressing Dry Gauze Foam 5. Secured With Medipore tape 7. Footwear/Offloading device applied Total Contact Cast] Notes foam donut. TCC applied by MD. Wound #2 (Left, Plantar Foot) 1. Cleanse With Wound Cleanser Soap and water 3. Primary Dressing Applied Calcium Alginate Ag 4. Secondary Dressing Dry Gauze Foam 5. Secured With Medipore tape 7. Footwear/Offloading device applied Total Contact Cast Notes foam donut. TCC applied by MD. Electronic Signature(s) Signed: 10/28/2019 5:44:18 PM By: Levan Hurst RN, BSN Signed: 10/29/2019 10:32:20 AM By: Linton Ham MD Entered By: Linton Ham on 10/28/2019 10:16:59 -------------------------------------------------------------------------------- Multi-Disciplinary Care Plan Details Patient Name: Date of Service: RYVER, POBLETE 10/28/2019 10:45 AM Medical Record [id number] Patient Account Number: [id number] Date of Birth/Sex: Treating RN: June 27, 1951 (69 y.o. [name] Primary Care Chloe Flis: [name] Other Clinician: Referring Jackelin Correia: Treating Britania Shreeve/Extender:Robson, Wayland Denis, Clelia Schaumann in Treatment: 1 Active Inactive Nutrition Nursing Diagnoses: Impaired glucose control: actual or potential Potential for alteratiion in Nutrition/Potential for imbalanced nutrition Goals: Patient/caregiver will maintain therapeutic glucose control Date Initiated: 10/18/2019 Target Resolution Date: 11/15/2019 Goal Status: Active Interventions: Assess HgA1c results as ordered upon admission and as needed Assess patient nutrition upon admission and as needed per policy Provide education on elevated blood sugars and impact on wound healing Treatment Activities: Patient referred to Primary Care Physician for further nutritional evaluation : 10/18/2019 Notes: Wound/Skin Impairment Nursing Diagnoses: Impaired tissue integrity Knowledge deficit related to ulceration/compromised skin  integrity Goals: Patient/caregiver will verbalize understanding of skin care regimen Date Initiated: 10/18/2019 Target Resolution Date: 11/15/2019 Goal Status: Active Ulcer/skin breakdown will have a volume reduction of 30% by week 4 Date Initiated: 10/18/2019 Target Resolution Date: 11/15/2019 Goal Status: Active Interventions: Assess patient/caregiver ability to obtain necessary supplies Assess patient/caregiver ability to perform ulcer/skin care regimen upon admission and as needed Assess ulceration(s) every visit Provide education on ulcer and skin care Treatment Activities: Skin care regimen initiated : 10/18/2019 Topical wound management initiated : 10/18/2019 Notes: Electronic Signature(s) Signed: 10/28/2019 5:25:09 PM By: Deon Pilling Entered By: Deon Pilling on 10/28/2019 10:15:19 -------------------------------------------------------------------------------- Pain Assessment Details Patient Name: Date of Service: DOMINQUE, MARLIN 10/28/2019 10:45 AM Medical Record [id number] Patient Account Number: [id number] Date of Birth/Sex: Treating RN: [birth year] (69 y.o. [name] Primary Care Myrah Strawderman: [name] Other Clinician: Referring Krissia Schreier: Treating Erica Richwine/Extender:Robson, Wayland Denis, Clelia Schaumann in Treatment: 1 Active Problems Location of Pain Severity and Description of Pain Patient Has Paino No Site Locations Rate the pain. Current Pain Level: 0 Pain Management and Medication Current Pain Management: Medication: No Cold Application: No Rest: No Massage: No Activity: No T.E.N.S.: No Heat Application: No Leg drop or elevation: No Is the Current Pain Management Adequate: Adequate How does your wound impact your activities of daily livingo Sleep: No Bathing: No Appetite: No  Relationship With Others: No Bladder Continence: No Emotions: No Bowel Continence: No Work: No Toileting: No Drive: No Dressing: No Hobbies: No Electronic  Signature(s) Signed: 10/28/2019 5:25:45 PM By: Kela Millin Entered By: Kela Millin on 10/28/2019 09:46:09 -------------------------------------------------------------------------------- Patient/Caregiver Education Details Patient Name: Date of Service: [name] 3/15/2021andnbsp10:45 AM Medical Record [id number] Patient Account Number: [id number] Date of Birth/Gender: Treating RN: 1951-05-11 (69 y.o. [name] Primary Care Physician: [name] Other Clinician: Referring Physician: Treating Physician/Extender:Robson, Wayland Denis, Clelia Schaumann in Treatment: 1 Education Assessment Education Provided To: Patient Education Topics Provided Wound/Skin Impairment: Handouts: Skin Care Do's and Dont's Methods: Explain/Verbal Responses: Reinforcements needed Electronic Signature(s) Signed: 10/28/2019 5:25:09 PM By: Deon Pilling Entered By: Deon Pilling on 10/28/2019 10:15:33 -------------------------------------------------------------------------------- Wound Assessment Details Patient Name: Date of Service: DELSIN, COPEN 10/28/2019 10:45 AM Medical Record [id number] Patient Account Number: [id number] Date of Birth/Sex: Treating RN: 02-20-1951 (69 y.o. [name] Primary Care Arvind Mexicano: [name] Other Clinician: Referring Abron Neddo: Treating Lanell Carpenter/Extender:Robson, Wayland Denis, Clelia Schaumann in Treatment: 1 Wound Status Wound Number: 1 Primary Diabetic Wound/Ulcer of the Lower Extremity Etiology: Wound Location: Left Foot - Lateral Wound Open Wounding Event: Gradually Appeared Status: Date Acquired: 08/15/2018 Comorbid Congestive Heart Failure, Hypertension, Weeks Of Treatment: 1 History: Peripheral Venous Disease, Type II Diabetes Clustered Wound: No Wound Measurements Length: (cm) 1.5 Width: (cm) 1.7 Depth: (cm) 0.2 Area: (cm) 2.003 Volume: (cm) 0.401 Wound Description Classification: Grade  2 Wound Margin: Flat and Intact Exudate Amount: Medium Exudate Type: Serosanguineous Exudate Color: red, brown Wound Bed Granulation Amount: Large (67-100%) Granulation Quality: Red, Pink, Pale Necrotic Amount: Small (1-33%) Necrotic Quality: Adherent Slough After Cleansing: No brino Yes Exposed Structure sed: No Subcutaneous Tissue) Exposed: Yes sed: No sed: No ed: No d: No % Reduction in Area: 29.1% % Reduction in Volume: 71.6% Epithelialization: None Tunneling: No Undermining: No Foul Odor Slough/Fi Fascia Expo Fat Layer ( Tendon Expo Muscle Expo Joint Expos Bone Expose Electronic Signature(s) Signed: 10/28/2019 5:25:45 PM By: Kela Millin Entered By: Kela Millin on 10/28/2019 09:51:08 -------------------------------------------------------------------------------- Wound Assessment Details Patient Name: Date of Service: GOKUL, WAYBRIGHT 10/28/2019 10:45 AM Medical Record [id number] Patient Account Number: [id number] Date of Birth/Sex: Treating RN: Jun 27, 1951 (69 y.o. [name] Primary Care Felicidad Sugarman: [name] Other Clinician: Referring Darci Lykins: Treating Marrie Chandra/Extender:Robson, Wayland Denis, Clelia Schaumann in Treatment: 1 Wound Status Wound Number: 2 Primary Diabetic Wound/Ulcer of the Lower Extremity Etiology: Wound Location: Left Foot - Plantar Wound Open Wounding Event: Gradually Appeared Status: Date Acquired: 10/15/2018 Comorbid Congestive Heart Failure, Hypertension, Weeks Of Treatment: 1 History: Peripheral Venous Disease, Type II Diabetes Clustered Wound: No Wound Measurements Length: (cm) 0.7 % Reduction Width: (cm) 1.4 % Reduction Depth: (cm) 0.2 Epithelializ Area: (cm) 0.77 Tunneling: Volume: (cm) 0.154 Undermining Wound Description Classification: Grade 2 Wound Margin: Flat and Intact Exudate Amount: Medium Exudate Type: Serosanguineous Exudate Color: red, brown Wound Bed Granulation Amount:  Medium (34-66%) Granulation Quality: Pink Necrotic Amount: Medium (34-66%) Necrotic Quality: Adherent Slough Foul Odor After Cleansing: No Slough/Fibrino Yes Exposed Structure Fascia Exposed: No Fat Layer (Subcutaneous Tissue) Exposed: Yes Tendon Exposed: No Muscle Exposed: No Joint Exposed: No Bone Exposed: No in Area: -133.3% in Volume: -133.3% ation: None No : No Electronic Signature(s) Signed: 10/28/2019 5:25:45 PM By: Kela Millin Entered By: Kela Millin on 10/28/2019 09:51:18 -------------------------------------------------------------------------------- [place] Details Patient Name: Date of Service: [name] 10/28/2019 10:45 AM Medical Record [id number] Patient Account Number: [id number] Date of Birth/Sex: Treating RN: 19-Oct-1950 (69 y.o. M)  Kela Millin Primary Care Arly Salminen: [name] Other Clinician: Referring Deepika Dec[place]: Treating Brighten Buzzelli/Extender:Robson, Wayland Denis, Clelia Schaumann in Treatment: 1 Vital Signs Time Taken: 09:40 Temperature (F): 98 Height (in): 67 Pulse (bpm): 64 Weight (lbs): 173 Respiratory Rate (breaths/min): 16 Body Mass Index (BMI): 27.1 Blood Pressure (mmHg): 143/61 Reference Range: 80 - 120 mg / dl Electronic Signature(s) Signed: 10/28/2019 5:25:45 PM By: Kela Millin Entered By: Kela Millin on 10/28/2019 09:45:43

## 2019-10-29 NOTE — Progress Notes (Signed)
[place], [place] ([id number]) Visit Report for 10/28/2019 HPI Details Patient Name: Date of Service: [name], [name] 10/28/2019 10:45 AM Medical Record [id number] Patient Account Number: [id number] Date of Birth/Sex: Treating RN: October 23, 1950 (69 y.o. [name] Primary Care Provider: [name] Other Clinician: Referring Provider: Treating Provider/Extender:Ronita Hargreaves, Wayland Denis, Clelia Schaumann in Treatment: 1 History of Present Illness HPI Description: ADMISSION 10/18/2019 Patient is a 69 year old man with type 2 diabetes peripheral neuropathy and a Charcot foot. The problem I think began in March 2020 just about a year ago when he was admitted to hospital from 10/19/2018 through 10/24/2018 with a wound on his left lateral foot and intense cellulitis. MRI did not suggest osteomyelitis but did do note a draining sinus. He apparently received a prolonged course of antibiotics. His hemoglobin A1c at that time was 13.9. He was referred to Dr. [name] where he appears to been followed for probably 5 or 6 months. He was subsequently referred himself to Dr. [name] of podiatry. He has been offloaded in a surgical shoe with a Pegasys sole. He has been mostly using Medihoney and episodic debridement of intense surrounding callus. He has been referred here for further management of this difficult the heel wound area and consideration of hyperbaric oxygen The patient had an angiogram in November. He did not have any proximal stenoses but he did have a 70 to 80% peroneal artery occlusion as well as an 80 to 95% stenosis of the anterior tibia which underwent an angioplasty. Follow-up ABIs were done on 07/02/2019 showed an ABI on the right of 1.26 on the left at 1.28 with TBI's of 0.65 and 0.67 respectively. Waveforms were triphasic Past medical history includes type 2 diabetes with neuropathy, PAD and retinopathy, prostate cancer, gastroesophageal reflux disease, chronic kidney disease,  small bowel obstruction 10/25/2019. Patient's x-ray of the left foot from last time showed no evidence of osteomyelitis. Chronic Charcot/neuropathic changes as expected. Notable soft tissue ulcer involving the plantar aspect underlying the forefoot. The patient actually has 2 wounds. One on the mid forefoot limited to breakdown of skin. The larger area is actually on the lateral foot. We have been using silver alginate to both wound areas. 3/15; patient came back for the obligatory first total contact cast change. He was apparently walking on the cast without the boot but otherwise tolerated it well and per our intake nurse the wounds look satisfactory. He will be back later in the week for his wound care visit Electronic Signature(s) Signed: 10/29/2019 10:32:20 AM By: Linton Ham MD Entered By: Linton Ham on 10/28/2019 10:17:52 -------------------------------------------------------------------------------- Physical Exam Details Patient Name: Date of Service: [name], [name] 10/28/2019 10:45 AM Medical Record [id number] Patient Account Number: [id number] Date of Birth/Sex: Treating RN: 05-14-1951 (69 y.o. [name] Primary Care Provider: [name] Other Clinician: Referring Provider: Treating Provider/Extender:Genelle Economou, Wayland Denis, Clelia Schaumann in Treatment: 1 Notes Wound exam; the patient was here for an obligatory total contact cast change. This was done in the standard fashion he tolerated this well. Electronic Signature(s) Signed: 10/29/2019 10:32:20 AM By: Linton Ham MD Entered By: Linton Ham on 10/28/2019 10:18:18 -------------------------------------------------------------------------------- Physician Orders Details Patient Name: Date of Service: PEARSON, PICOU 10/28/2019 10:45 AM Medical Record [id number] Patient Account Number: [id number] Date of Birth/Sex: Treating RN: 1950-10-25 (69 y.o. [name] Primary Care  Provider: [name] Other Clinician: Referring Provider: Treating Provider/Extender:Shaquela Weichert, Wayland Denis, Clelia Schaumann in Treatment: 1 Verbal / Phone Orders: No Diagnosis Coding Follow-up Appointments Return Appointment in 1 week. -  Friday Dressing Change Frequency Wound #1 Left,Lateral Foot Do not change entire dressing for one week. Wound #2 Left,Plantar Foot Do not change entire dressing for one week. Wound Cleansing Wound #1 Left,Lateral Foot May shower with protection. - cast protector Wound #2 Left,Plantar Foot May shower with protection. - cast protector Primary Wound Dressing Wound #1 Left,Lateral Foot Calcium Alginate with Silver Wound #2 Left,Plantar Foot Calcium Alginate with Silver Secondary Dressing Wound #1 Left,Lateral Foot Foam - donut Dry Gauze - tape Wound #2 Left,Plantar Foot Foam - donut Dry Gauze - tape Off-Loading Total Contact Cast to Left [facility] skilled nursing for wound care. - [facility], HOLD for wound care due to cast placed on. Electronic Signature(s) Signed: 10/28/2019 5:25:09 PM By: Deon Pilling Signed: 10/29/2019 10:32:20 AM By: Linton Ham MD Entered By: Deon Pilling on 10/28/2019 10:15:11 -------------------------------------------------------------------------------- Problem List Details Patient Name: Date of Service: [name], [name] 10/28/2019 10:45 AM Medical Record [id number] Patient Account Number: [id number] Date of Birth/Sex: Treating RN: [birth year] (69 y.o. Lorette Ang, Tammi Klippel Primary Care Provider: [name] Other Clinician: Referring Provider: Treating Provider/Extender:Keelyn Fjelstad, Wayland Denis, Clelia Schaumann in Treatment: 1 Active Problems ICD-10 Evaluated Encounter Code Description Active Date Today Diagnosis L97.522 Non-pressure chronic ulcer of other part of left foot 10/18/2019 No Yes with fat layer exposed E11.51 Type 2 diabetes mellitus with diabetic  peripheral 10/18/2019 No Yes angiopathy without gangrene E11.621 Type 2 diabetes mellitus with foot ulcer 10/18/2019 No Yes L97.521 Non-pressure chronic ulcer of other part of left foot 10/25/2019 No Yes limited to breakdown of skin M14.672 Charcot's joint, left ankle and foot 10/18/2019 No Yes E11.40 Type 2 diabetes mellitus with diabetic neuropathy, 10/18/2019 No Yes unspecified Inactive Problems Resolved Problems Electronic Signature(s) Signed: 10/29/2019 10:32:20 AM By: Linton Ham MD Entered By: Linton Ham on 10/28/2019 10:16:46 -------------------------------------------------------------------------------- Progress Note Details Patient Name: Date of Service: [name] 10/28/2019 10:45 AM Medical Record [id number] Patient Account Number: [id number] Date of Birth/Sex: Treating RN: 1951/06/28 (69 y.o. [name] Primary Care Provider: [name] Other Clinician: Referring Provider: Treating Provider/Extender:Amarii Bordas, Wayland Denis, Clelia Schaumann in Treatment: 1 Subjective History of Present Illness (HPI) ADMISSION 10/18/2019 Patient is a 69 year old man with type 2 diabetes peripheral neuropathy and a Charcot foot. The problem I think began in March 2020 just about a year ago when he was admitted to hospital from 10/19/2018 through 10/24/2018 with a wound on his left lateral foot and intense cellulitis. MRI did not suggest osteomyelitis but did do note a draining sinus. He apparently received a prolonged course of antibiotics. His hemoglobin A1c at that time was 13.9. He was referred to Dr. [name] where he appears to been followed for probably 5 or 6 months. He was subsequently referred himself to Dr. [name] of podiatry. He has been offloaded in a surgical shoe with a Pegasys sole. He has been mostly using Medihoney and episodic debridement of intense surrounding callus. He has been referred here for further management of this difficult the heel wound area and  consideration of hyperbaric oxygen The patient had an angiogram in November. He did not have any proximal stenoses but he did have a 70 to 80% peroneal artery occlusion as well as an 80 to 95% stenosis of the anterior tibia which underwent an angioplasty. Follow-up ABIs were done on 07/02/2019 showed an ABI on the right of 1.26 on the left at 1.28 with TBI's of 0.65 and 0.67 respectively. Waveforms were triphasic Past medical history includes type 2  diabetes with neuropathy, PAD and retinopathy, prostate cancer, gastroesophageal reflux disease, chronic kidney disease, small bowel obstruction 10/25/2019. Patient's x-ray of the left foot from last time showed no evidence of osteomyelitis. Chronic Charcot/neuropathic changes as expected. Notable soft tissue ulcer involving the plantar aspect underlying the forefoot. The patient actually has 2 wounds. One on the mid forefoot limited to breakdown of skin. The larger area is actually on the lateral foot. We have been using silver alginate to both wound areas. 3/15; patient came back for the obligatory first total contact cast change. He was apparently walking on the cast without the boot but otherwise tolerated it well and per our intake nurse the wounds look satisfactory. He will be back later in the week for his wound care visit Objective Constitutional Vitals Time Taken: 9:40 AM, Height: 67 in, Weight: 173 lbs, BMI: 27.1, Temperature: 98 F, Pulse: 64 bpm, Respiratory Rate: 16 breaths/min, Blood Pressure: 143/61 mmHg. Integumentary (Hair, Skin) Wound #1 status is Open. Original cause of wound was Gradually Appeared. The wound is located on the Left,Lateral Foot. The wound measures 1.5cm length x 1.7cm width x 0.2cm depth; 2.003cm^2 area and 0.401cm^3 volume. There is Fat Layer (Subcutaneous Tissue) Exposed exposed. There is no tunneling or undermining noted. There is a medium amount of serosanguineous drainage noted. The wound margin is flat and  intact. There is large (67-100%) red, pink, pale granulation within the wound bed. There is a small (1-33%) amount of necrotic tissue within the wound bed including Adherent Slough. Wound #2 status is Open. Original cause of wound was Gradually Appeared. The wound is located on the [place]. The wound measures 0.7cm length x 1.4cm width x 0.2cm depth; 0.77cm^2 area and 0.154cm^3 volume. There is Fat Layer (Subcutaneous Tissue) Exposed exposed. There is no tunneling or undermining noted. There is a medium amount of serosanguineous drainage noted. The wound margin is flat and intact. There is medium (34-66%) pink granulation within the wound bed. There is a medium (34-66%) amount of necrotic tissue within the wound bed including Adherent Slough. Assessment Active Problems ICD-10 Non-pressure chronic ulcer of other part of left foot with fat layer exposed Type 2 diabetes mellitus with diabetic peripheral angiopathy without gangrene Type 2 diabetes mellitus with foot ulcer Non-pressure chronic ulcer of other part of left foot limited to breakdown of skin Charcot's joint, left ankle and foot Type 2 diabetes mellitus with diabetic neuropathy, unspecified Procedures Wound #1 Pre-procedure diagnosis of Wound #1 is a Diabetic Wound/Ulcer of the Lower Extremity located on the Left,Lateral Foot . There was a Total Contact Cast Procedure by Ricard Dillon., MD. Post procedure Diagnosis Wound #1: Same as Pre-Procedure Wound #2 Pre-procedure diagnosis of Wound #2 is a Diabetic Wound/Ulcer of the Lower Extremity located on the [place] . There was a Total Contact Cast Procedure by Ricard Dillon., MD. Post procedure Diagnosis Wound #2: Same as Pre-Procedure Plan Follow-up Appointments: Return Appointment in 1 week. - Friday Dressing Change Frequency: Wound #1 Left,Lateral Foot: Do not change entire dressing for one week. Wound #2 Left,Plantar Foot: Do not change entire  dressing for one week. Wound Cleansing: Wound #1 Left,Lateral Foot: May shower with protection. - cast protector Wound #2 Left,Plantar Foot: May shower with protection. - cast protector Primary Wound Dressing: Wound #1 Left,Lateral Foot: Calcium Alginate with Silver Wound #2 Left,Plantar Foot: Calcium Alginate with Silver Secondary Dressing: Wound #1 Left,Lateral Foot: Foam - donut Dry Gauze - tape Wound #2 Left,Plantar Foot: Foam - donut Dry  Gauze - tape Off-Loading: Total Contact Cast to Left Lower Extremity Home Health: [place] skilled nursing for wound care. - [facility], HOLD for wound care due to cast placed on. 1. We will continue with silver alginate to the wounds. Total contact cast change today follow-up later in the week Electronic Signature(s) Signed: 10/29/2019 10:32:20 AM By: Linton Ham MD Entered By: Linton Ham on 10/28/2019 10:19:00 -------------------------------------------------------------------------------- Total Contact Cast Details Patient Name: Date of Service: [name], [name] 10/28/2019 10:45 AM Medical Record [id number] Patient Account Number: [id number] Date of Birth/Sex: Treating RN: 30-Apr-1951 (69 y.o. [name] Primary Care Provider: [name] Other Clinician: Referring Provider: Treating Provider/Extender:Tiyonna Sardinha, Wayland Denis, Clelia Schaumann in Treatment: 1 Total Contact Cast Applied for Wound Assessment: Wound #1 Left,Lateral Foot Performed By: Physician Ricard Dillon., MD Post Procedure Diagnosis Same as Pre-procedure Electronic Signature(s) Signed: 10/29/2019 10:32:20 AM By: Linton Ham MD Entered By: Linton Ham on 10/28/2019 10:17:08 -------------------------------------------------------------------------------- Total Contact Cast Details Patient Name: Date of Service: [name], [name] 10/28/2019 10:45 AM Medical Record [id number] Patient Account Number: [id number] Date of  Birth/Sex: 1950/08/23 (68 y.o. M) Treating RN: Levan Hurst Primary Care Provider: [name] Other Clinician: Referring Provider: Treating Provider/Extender:Jaylon Boylen, Wayland Denis, Clelia Schaumann in Treatment: 1 Total Contact Cast Applied for Wound Assessment: Wound #2 Left,Plantar Foot Performed By: Physician Ricard Dillon., MD Post Procedure Diagnosis Same as Pre-procedure Electronic Signature(s) Signed: 10/29/2019 10:32:20 AM By: Linton Ham MD Entered By: Linton Ham on 10/28/2019 10:17:16 -------------------------------------------------------------------------------- SuperBill Details Patient Name: Date of Service: [name] 10/28/2019 Medical Record [id number] Patient Account Number: [id number] Date of Birth/Sex: Treating RN: 1951/05/12 (69 y.o. [name] Primary Care Provider: [name] Other Clinician: Referring Provider: Treating Provider/Extender:Breelynn Bankert, Wayland Denis, Clelia Schaumann in Treatment: 1 Diagnosis Coding ICD-10 Codes Code Description [phone] Non-pressure chronic ulcer of other part of left foot with fat layer exposed E11.51 Type 2 diabetes mellitus with diabetic peripheral angiopathy without gangrene E11.621 Type 2 diabetes mellitus with foot ulcer L97.521 Non-pressure chronic ulcer of other part of left foot limited to breakdown of skin M14.672 Charcot's joint, left ankle and foot E11.40 Type 2 diabetes mellitus with diabetic neuropathy, unspecified Facility Procedures CPT4 Code Description: [id number] - APPLY TOTAL CONTACT LEG CAST ICD-10 Diagnosis Description L97.522 Non-pressure chronic ulcer of other part of left foot with Modifier: fat layer exp Quantity: 1 osed Physician Procedures CPT4 Code Description: [id number] - WC PHYS APPLY TOTAL CONTACT CAST ICD-10 Diagnosis Description L97.522 Non-pressure chronic ulcer of other part of left foot with f Modifier: at layer exp Quantity: 1 osed Electronic  Signature(s) Signed: 10/29/2019 10:32:20 AM By: Linton Ham MD Entered By: Linton Ham on 10/28/2019 10:19:21

## 2019-10-30 ENCOUNTER — Encounter: Payer: Self-pay | Admitting: Family Medicine

## 2019-10-31 ENCOUNTER — Encounter (HOSPITAL_BASED_OUTPATIENT_CLINIC_OR_DEPARTMENT_OTHER): Payer: Medicare Other | Admitting: Internal Medicine

## 2019-10-31 ENCOUNTER — Other Ambulatory Visit: Payer: Self-pay

## 2019-10-31 DIAGNOSIS — E11621 Type 2 diabetes mellitus with foot ulcer: Secondary | ICD-10-CM | POA: Diagnosis not present

## 2019-10-31 NOTE — Progress Notes (Signed)
Cessna, Anna ([id number]) Visit Report for 10/31/2019 Debridement Details Patient Name: Date of Service: ZADE, FALKNER 10/31/2019 10:15 AM Medical Record [id number] Patient Account Number: [id number] Date of Birth/Sex: 07/17/1951 (68 y.o. M) Treating RN: Deon Pilling Primary Care Provider: [name] Other Clinician: Referring Provider: Treating Provider/Extender:Amarian Botero, Wayland Denis, Clelia Schaumann in Treatment: 1 Debridement Performed for Wound #1 Left,Lateral Foot Assessment: Performed By: Physician Ricard Dillon., MD Debridement Type: Debridement Severity of Tissue Pre Fat layer exposed Debridement: Level of Consciousness (Pre- Awake and Alert procedure): Pre-procedure Verification/Time Out Taken: Yes - 11:15 Start Time: 11:16 Pain Control: Lidocaine 4% Topical Solution Total Area Debrided (L x W): 1.5 (cm) x 1.7 (cm) = 2.55 (cm) Tissue and other material Non-Viable, Callus, Subcutaneous, Fibrin/Exudate debrided: Level: Skin/Subcutaneous Tissue Debridement Description: Excisional Instrument: Curette Bleeding: None End Time: 11:19 Procedural Pain: 0 Post Procedural Pain: 0 Response to Treatment: Procedure was tolerated well Level of Consciousness Awake and Alert (Post-procedure): Post Debridement Measurements of Total Wound Length: (cm) 1.5 Width: (cm) 1.7 Depth: (cm) 0.1 Volume: (cm) 0.2 Character of Wound/Ulcer Post Improved Debridement: Severity of Tissue Post Debridement: Fat layer exposed Post Procedure Diagnosis Same as Pre-procedure Electronic Signature(s) Signed: 10/31/2019 4:55:35 PM By: Deon Pilling Signed: 10/31/2019 7:02:26 PM By: Linton Ham MD Entered By: Deon Pilling on 10/31/2019 11:21:03 -------------------------------------------------------------------------------- Debridement Details Patient Name: Date of Service: [name] 10/31/2019 10:15 AM Medical Record [id number] Patient Account Number:  [id number] Date of Birth/Sex: Jul 31, 1951 (68 y.o. M) Treating RN: Deon Pilling Primary Care Provider: [name] Other Clinician: Referring Provider: Treating Provider/Extender:Anam Bobby, Wayland Denis, Clelia Schaumann in Treatment: 1 Debridement Performed for Wound #2 Left,Plantar Foot Assessment: Performed By: Physician Ricard Dillon., MD Debridement Type: Debridement Severity of Tissue Pre Fat layer exposed Debridement: Level of Consciousness (Pre- Awake and Alert procedure): Pre-procedure Yes - 11:15 Verification/Time Out Taken: Start Time: 11:16 Pain Control: Lidocaine 4% Topical Solution Total Area Debrided (L x W): 2 (cm) x 2 (cm) = 4 (cm) Tissue and other material Non-Viable, Callus, Fibrin/Exudate debrided: Level: Non-Viable Tissue Debridement Description: Selective/Open Wound Instrument: Curette Bleeding: None End Time: 11:19 Procedural Pain: 0 Post Procedural Pain: 0 Response to Treatment: Procedure was tolerated well Level of Consciousness Awake and Alert (Post-procedure): Post Debridement Measurements of Total Wound Length: (cm) 0.2 Width: (cm) 0.2 Depth: (cm) 0.1 Volume: (cm) 0.003 Character of Wound/Ulcer Post Improved Debridement: Severity of Tissue Post Debridement: Fat layer exposed Post Procedure Diagnosis Same as Pre-procedure Electronic Signature(s) Signed: 10/31/2019 4:55:35 PM By: Deon Pilling Signed: 10/31/2019 7:02:26 PM By: Linton Ham MD Entered By: Linton Ham on 10/31/2019 12:47:48 -------------------------------------------------------------------------------- HPI Details Patient Name: Date of Service: [name] 10/31/2019 10:15 AM Medical Record [id number] Patient Account Number: [id number] Date of Birth/Sex: Treating RN: 06/02/1951 (69 y.o. [name] Primary Care Provider: [name] Other Clinician: Referring Provider: Treating Provider/Extender:Sindee Stucker, Wayland Denis, Clelia Schaumann in  Treatment: 1 History of Present Illness HPI Description: ADMISSION 10/18/2019 Patient is a 69 year old man with type 2 diabetes peripheral neuropathy and a Charcot foot. The problem I think began in March 2020 just about a year ago when he was admitted to hospital from 10/19/2018 through 10/24/2018 with a wound on his left lateral foot and intense cellulitis. MRI did not suggest osteomyelitis but did do note a draining sinus. He apparently received a prolonged course of antibiotics. His hemoglobin A1c at that time was 13.9. He was referred to Dr. [name] where he appears to been followed for probably 5 or 6 months.  He was subsequently referred himself to Dr. [name] of podiatry. He has been offloaded in a surgical shoe with a Pegasys sole. He has been mostly using Medihoney and episodic debridement of intense surrounding callus. He has been referred here for further management of this difficult the heel wound area and consideration of hyperbaric oxygen The patient had an angiogram in November. He did not have any proximal stenoses but he did have a 70 to 80% peroneal artery occlusion as well as an 80 to 95% stenosis of the anterior tibia which underwent an angioplasty. Follow-up ABIs were done on 07/02/2019 showed an ABI on the right of 1.26 on the left at 1.28 with TBI's of 0.65 and 0.67 respectively. Waveforms were triphasic Past medical history includes type 2 diabetes with neuropathy, PAD and retinopathy, prostate cancer, gastroesophageal reflux disease, chronic kidney disease, small bowel obstruction 10/25/2019. Patient's x-ray of the left foot from last time showed no evidence of osteomyelitis. Chronic Charcot/neuropathic changes as expected. Notable soft tissue ulcer involving the plantar aspect underlying the forefoot. The patient actually has 2 wounds. One on the mid forefoot limited to breakdown of skin. The larger area is actually on the lateral foot. We have been using silver alginate to both  wound areas. 3/15; patient came back for the obligatory first total contact cast change. He was apparently walking on the cast without the boot but otherwise tolerated it well and per our intake nurse the wounds look satisfactory. He will be back later in the week for his wound care visit 3/18; patient's wound on the plantar foot looks smaller of lateral foot about the same. Both required debridement. There was some macerated tissue between the 2 wounds which were also removed. We have been using silver alginate. Electronic Signature(s) Signed: 10/31/2019 7:02:26 PM By: Linton Ham MD Entered By: Linton Ham on 10/31/2019 12:48:40 -------------------------------------------------------------------------------- Physical Exam Details Patient Name: Date of Service: LASALLE, ABEE 10/31/2019 10:15 AM Medical Record [id number] Patient Account Number: [id number] Date of Birth/Sex: Treating RN: 1951/04/11 (69 y.o. [name] Primary Care Provider: [name] Other Clinician: Referring Provider: Treating Provider/Extender:Rashawn Rayman, Wayland Denis, Clelia Schaumann in Treatment: 1 Constitutional Patient is hypertensive.. Pulse regular and within target range for patient.[name] Respirations regular, non-labored and within target range.. Temperature is normal and within the target range for the patient.[name] Appears in no distress. Notes Wound exam; wounds were slightly smaller on the plantar foot although he had some macerated tissue between the 2 wounds. Lateral foot wound also requires some debridement but looks very healthy. Electronic Signature(s) Signed: 10/31/2019 7:02:26 PM By: Linton Ham MD Entered By: Linton Ham on 10/31/2019 12:49:19 -------------------------------------------------------------------------------- Physician Orders Details Patient Name: Date of Service: HASEEB, FIALLOS 10/31/2019 10:15 AM Medical Record [id number] Patient Account Number:  [id number] Date of Birth/Sex: Treating RN: 1951-04-30 (69 y.o. [name] Primary Care Provider: [name] Other Clinician: Referring Provider: Treating Provider/Extender:Dianca Owensby, Wayland Denis, Clelia Schaumann in Treatment: 1 Verbal / Phone Orders: No Diagnosis Coding ICD-10 Coding Code Description [phone] Non-pressure chronic ulcer of other part of left foot with fat layer exposed E11.51 Type 2 diabetes mellitus with diabetic peripheral angiopathy without gangrene E11.621 Type 2 diabetes mellitus with foot ulcer L97.521 Non-pressure chronic ulcer of other part of left foot limited to breakdown of skin M14.672 Charcot's joint, left ankle and foot E11.40 Type 2 diabetes mellitus with diabetic neuropathy, unspecified Follow-up Appointments Return Appointment in 1 week. - Friday Dressing Change Frequency Wound #1 Left,Lateral Foot Do not change entire dressing  for one week. Wound #2 Left,Plantar Foot Do not change entire dressing for one week. Skin Barriers/Peri-Wound Care Barrier cream - to macerated periwound area. Wound Cleansing Wound #1 Left,Lateral Foot May shower with protection. - cast protector Wound #2 Left,Plantar Foot May shower with protection. - cast protector Primary Wound Dressing Wound #1 Left,Lateral Foot Calcium Alginate with Silver Wound #2 Left,Plantar Foot Calcium Alginate with Silver Secondary Dressing Wound #1 Left,Lateral Foot Foam - donut Dry Gauze - tape Wound #2 Left,Plantar Foot Foam - donut Dry Gauze - tape Off-Loading Total Contact Cast to Left [place] skilled nursing for wound care. - [facility], HOLD for wound care due to cast placed on. Electronic Signature(s) Signed: 10/31/2019 4:55:35 PM By: Deon Pilling Signed: 10/31/2019 7:02:26 PM By: Linton Ham MD Entered By: Deon Pilling on 10/31/2019  11:19:04 -------------------------------------------------------------------------------- Problem List Details Patient Name: Date of Service: FINLEE, MILO 10/31/2019 10:15 AM Medical Record [id number] Patient Account Number: [id number] Date of Birth/Sex: Treating RN: May 15, 1951 (69 y.o. Lorette Ang, Tammi Klippel Primary Care Provider: [name] Other Clinician: Referring Provider: Treating Provider/Extender:Nailani Full, Wayland Denis, Clelia Schaumann in Treatment: 1 Active Problems ICD-10 Evaluated Encounter Code Description Active Date Today Diagnosis L97.522 Non-pressure chronic ulcer of other part of left foot 10/18/2019 No Yes with fat layer exposed E11.51 Type 2 diabetes mellitus with diabetic peripheral 10/18/2019 No Yes angiopathy without gangrene E11.621 Type 2 diabetes mellitus with foot ulcer 10/18/2019 No Yes L97.521 Non-pressure chronic ulcer of other part of left foot 10/25/2019 No Yes limited to breakdown of skin M14.672 Charcot's joint, left ankle and foot 10/18/2019 No Yes E11.40 Type 2 diabetes mellitus with diabetic neuropathy, 10/18/2019 No Yes unspecified Inactive Problems Resolved Problems Electronic Signature(s) Signed: 10/31/2019 7:02:26 PM By: Linton Ham MD Entered By: Linton Ham on 10/31/2019 12:45:47 -------------------------------------------------------------------------------- Progress Note Details Patient Name: Date of Service: [name] 10/31/2019 10:15 AM Medical Record [id number] Patient Account Number: [id number] Date of Birth/Sex: Treating RN: 1950/12/26 (69 y.o. [name] Primary Care Provider: [name] Other Clinician: Referring Provider: Treating Provider/Extender:Dathan Attia, Wayland Denis, Clelia Schaumann in Treatment: 1 Subjective History of Present Illness (HPI) ADMISSION 10/18/2019 Patient is a 69 year old man with type 2 diabetes peripheral neuropathy and a Charcot foot. The problem I think began in March  2020 just about a year ago when he was admitted to hospital from 10/19/2018 through 10/24/2018 with a wound on his left lateral foot and intense cellulitis. MRI did not suggest osteomyelitis but did do note a draining sinus. He apparently received a prolonged course of antibiotics. His hemoglobin A1c at that time was 13.9. He was referred to Dr. [name] where he appears to been followed for probably 5 or 6 months. He was subsequently referred himself to Dr. [name] of podiatry. He has been offloaded in a surgical shoe with a Pegasys sole. He has been mostly using Medihoney and episodic debridement of intense surrounding callus. He has been referred here for further management of this difficult the heel wound area and consideration of hyperbaric oxygen The patient had an angiogram in November. He did not have any proximal stenoses but he did have a 70 to 80% peroneal artery occlusion as well as an 80 to 95% stenosis of the anterior tibia which underwent an angioplasty. Follow-up ABIs were done on 07/02/2019 showed an ABI on the right of 1.26 on the left at 1.28 with TBI's of 0.65 and 0.67 respectively. Waveforms were triphasic Past medical history includes type 2 diabetes with neuropathy,  PAD and retinopathy, prostate cancer, gastroesophageal reflux disease, chronic kidney disease, small bowel obstruction 10/25/2019. Patient's x-ray of the left foot from last time showed no evidence of osteomyelitis. Chronic Charcot/neuropathic changes as expected. Notable soft tissue ulcer involving the plantar aspect underlying the forefoot. The patient actually has 2 wounds. One on the mid forefoot limited to breakdown of skin. The larger area is actually on the lateral foot. We have been using silver alginate to both wound areas. 3/15; patient came back for the obligatory first total contact cast change. He was apparently walking on the cast without the boot but otherwise tolerated it well and per our intake nurse the  wounds look satisfactory. He will be back later in the week for his wound care visit 3/18; patient's wound on the plantar foot looks smaller of lateral foot about the same. Both required debridement. There was some macerated tissue between the 2 wounds which were also removed. We have been using silver alginate. Objective Constitutional Patient is hypertensive.. Pulse regular and within target range for patient.[name] Respirations regular, non-labored and within target range.. Temperature is normal and within the target range for the patient.[name] Appears in no distress. Vitals Time Taken: 10:35 AM, Height: 67 in, Weight: 173 lbs, BMI: 27.1, Temperature: 98.3 F, Pulse: 67 bpm, Respiratory Rate: 16 breaths/min, Blood Pressure: 183/64 mmHg. General Notes: Wound exam; wounds were slightly smaller on the plantar foot although he had some macerated tissue between the 2 wounds. Lateral foot wound also requires some debridement but looks very healthy. Integumentary (Hair, Skin) Wound #1 status is Open. Original cause of wound was Gradually Appeared. The wound is located on the Left,Lateral Foot. The wound measures 1.5cm length x 1.7cm width x 0.1cm depth; 2.003cm^2 area and 0.2cm^3 volume. There is Fat Layer (Subcutaneous Tissue) Exposed exposed. There is no tunneling or undermining noted. There is a medium amount of purulent drainage noted. The wound margin is flat and intact. There is medium (34- 66%) red, pink, pale granulation within the wound bed. There is a medium (34-66%) amount of necrotic tissue within the wound bed including Adherent Slough. Wound #2 status is Open. Original cause of wound was Gradually Appeared. The wound is located on the [place]. The wound measures 0.2cm length x 0.2cm width x 0.1cm depth; 0.031cm^2 area and 0.003cm^3 volume. There is Fat Layer (Subcutaneous Tissue) Exposed exposed. There is no tunneling or undermining noted. There is a medium amount of  serosanguineous drainage noted. The wound margin is flat and intact. There is large (67-100%) red granulation within the wound bed. There is no necrotic tissue within the wound bed. Assessment Active Problems ICD-10 Non-pressure chronic ulcer of other part of left foot with fat layer exposed Type 2 diabetes mellitus with diabetic peripheral angiopathy without gangrene Type 2 diabetes mellitus with foot ulcer Non-pressure chronic ulcer of other part of left foot limited to breakdown of skin Charcot's joint, left ankle and foot Type 2 diabetes mellitus with diabetic neuropathy, unspecified Procedures Wound #1 Pre-procedure diagnosis of Wound #1 is a Diabetic Wound/Ulcer of the Lower Extremity located on the Left,Lateral Foot .Severity of Tissue Pre Debridement is: Fat layer exposed. There was a Excisional Skin/Subcutaneous Tissue Debridement with a total area of 2.55 sq cm performed by Ricard Dillon., MD. With the following instrument(s): Curette to remove Non-Viable tissue/material. Material removed includes Callus, Subcutaneous Tissue, and Fibrin/Exudate after achieving pain control using Lidocaine 4% Topical Solution. A time out was conducted at 11:15, prior to the start of the  procedure. There was no bleeding. The procedure was tolerated well with a pain level of 0 throughout and a pain level of 0 following the procedure. Post Debridement Measurements: 1.5cm length x 1.7cm width x 0.1cm depth; 0.2cm^3 volume. Character of Wound/Ulcer Post Debridement is improved. Severity of Tissue Post Debridement is: Fat layer exposed. Post procedure Diagnosis Wound #1: Same as Pre-Procedure Pre-procedure diagnosis of Wound #1 is a Diabetic Wound/Ulcer of the Lower Extremity located on the Left,Lateral Foot . There was a Total Contact Cast Procedure by Ricard Dillon., MD. Post procedure Diagnosis Wound #1: Same as Pre-Procedure Wound #2 Pre-procedure diagnosis of Wound #2 is a Diabetic  Wound/Ulcer of the Lower Extremity located on the [place] .Severity of Tissue Pre Debridement is: Fat layer exposed. There was a Selective/Open Wound Non-Viable Tissue Debridement with a total area of 4 sq cm performed by Ricard Dillon., MD. With the following instrument(s): Curette to remove Non-Viable tissue/material. Material removed includes Callus and Fibrin/Exudate and after achieving pain control using Lidocaine 4% Topical Solution. A time out was conducted at 11:15, prior to the start of the procedure. There was no bleeding. The procedure was tolerated well with a pain level of 0 throughout and a pain level of 0 following the procedure. Post Debridement Measurements: 0.2cm length x 0.2cm width x 0.1cm depth; 0.003cm^3 volume. Character of Wound/Ulcer Post Debridement is improved. Severity of Tissue Post Debridement is: Fat layer exposed. Post procedure Diagnosis Wound #2: Same as Pre-Procedure Pre-procedure diagnosis of Wound #2 is a Diabetic Wound/Ulcer of the Lower Extremity located on the Left,Plantar Foot . There was a Total Contact Cast Procedure by Ricard Dillon., MD. Post procedure Diagnosis Wound #2: Same as Pre-Procedure Plan Follow-up Appointments: Return Appointment in 1 week. - Friday Dressing Change Frequency: Wound #1 Left,Lateral Foot: Do not change entire dressing for one week. Wound #2 Left,Plantar Foot: Do not change entire dressing for one week. Skin Barriers/Peri-Wound Care: Barrier cream - to macerated periwound area. Wound Cleansing: Wound #1 Left,Lateral Foot: May shower with protection. - cast protector Wound #2 Left,Plantar Foot: May shower with protection. - cast protector Primary Wound Dressing: Wound #1 Left,Lateral Foot: Calcium Alginate with Silver Wound #2 Left,Plantar Foot: Calcium Alginate with Silver Secondary Dressing: Wound #1 Left,Lateral Foot: Foam - donut Dry Gauze - tape Wound #2 Left,Plantar Foot: Foam -  donut Dry Gauze - tape Off-Loading: Total Contact Cast to Left Lower Extremity Home Health: [place] [facility] for wound care. - [facility], HOLD for wound care due to cast placed on. 1. We used silver alginate continuing/gauze 2. Somewhat concerned about the macerated tissue we will have to be careful and we see this again next week. 3. Total contact cast reapplied Electronic Signature(s) Signed: 10/31/2019 7:02:26 PM By: Linton Ham MD Entered By: Linton Ham on 10/31/2019 12:51:27 -------------------------------------------------------------------------------- Total Contact Cast Details Patient Name: Date of Service: KAICEN, DESENA 10/31/2019 10:15 AM Medical Record [id number] Patient Account Number: [id number] Date of Birth/Sex: Treating RN: [birth year] (69 y.o. [name] Primary Care Provider: [name] Other Clinician: Referring Provider: Treating Provider/Extender:Mohamed Portlock, Wayland Denis, Clelia Schaumann in Treatment: 1 Total Contact Cast Applied for Wound Assessment: Wound #2 [place] Performed By: Physician Ricard Dillon., MD Post Procedure Diagnosis Same as Pre-procedure Electronic Signature(s) Signed: 10/31/2019 4:55:35 PM By: Deon Pilling Signed: 10/31/2019 7:02:26 PM By: Linton Ham MD Entered By: Deon Pilling on 10/31/2019 11:20:39 -------------------------------------------------------------------------------- Total Contact Cast Details Patient Name: Date of Service: [name] 10/31/2019  10:15 AM Medical Record [id number] Patient Account Number: [id number] Date of Birth/Sex: April 08, 1951 (69 y.o. M) Treating RN: Deon Pilling Primary Care Provider: [name] Other Clinician: Referring Provider: Treating Provider/Extender:Lewin Pellow, Wayland Denis, Clelia Schaumann in Treatment: 1 Total Contact Cast Applied for Wound Assessment: Wound #1 Left,Lateral Foot Performed By: Physician Ricard Dillon.,  MD Post Procedure Diagnosis Same as Pre-procedure Electronic Signature(s) Signed: 10/31/2019 7:02:26 PM By: Linton Ham MD Entered By: Linton Ham on 10/31/2019 12:47:31 -------------------------------------------------------------------------------- SuperBill Details Patient Name: Date of Service: [name] 10/31/2019 Medical Record [id number] Patient Account Number: [id number] Date of Birth/Sex: Treating RN: 1951-05-23 (69 y.o. [name] Primary Care Provider: [name] Other Clinician: Referring Provider: Treating Provider/Extender:Ramonita Koenig, Wayland Denis, Clelia Schaumann in Treatment: 1 Diagnosis Coding ICD-10 Codes Code Description [id number] Non-pressure chronic ulcer of other part of left foot with fat layer exposed E11.51 Type 2 diabetes mellitus with diabetic peripheral angiopathy without gangrene E11.621 Type 2 diabetes mellitus with foot ulcer L97.521 Non-pressure chronic ulcer of other part of left foot limited to breakdown of skin M14.672 Charcot's joint, left ankle and foot E11.40 Type 2 diabetes mellitus with diabetic neuropathy, unspecified Facility Procedures CPT4 Code Description: [id number] 11042 - DEB SUBQ TISSUE 20 SQ CM/< ICD-10 Diagnosis Description L97.521 Non-pressure chronic ulcer of other part of left foot limited Modifier: to breakdo Quantity: 1 wn of skin CPT4 Code Description: [id number] 97597 - DEBRIDE WOUND 1ST 20 SQ CM OR < ICD-10 Diagnosis Description L97.522 Non-pressure chronic ulcer of other part of left foot with fa Modifier: 63 t layer exp Quantity: 1 osed Physician Procedures CPT4 Code Description: [id number] 11042 - WC PHYS SUBQ TISS 20 SQ CM ICD-10 Diagnosis Description L97.521 Non-pressure chronic ulcer of other part of left foot limited Modifier: to breakdow Quantity: 1 n of skin CPT4 Code Description: [id number] 07622 - WC PHYS DEBR WO ANESTH 20 SQ CM ICD-10 Diagnosis Description L97.522 Non-pressure chronic ulcer of  other part of left foot with fa Modifier: 59 t layer expo Quantity: 1 sed Electronic Signature(s) Signed: 10/31/2019 7:02:26 PM By: Linton Ham MD Entered By: Linton Ham on 10/31/2019 12:52:02

## 2019-11-08 ENCOUNTER — Encounter (HOSPITAL_BASED_OUTPATIENT_CLINIC_OR_DEPARTMENT_OTHER): Payer: Medicare Other | Admitting: Internal Medicine

## 2019-11-08 ENCOUNTER — Other Ambulatory Visit: Payer: Self-pay

## 2019-11-08 DIAGNOSIS — E11621 Type 2 diabetes mellitus with foot ulcer: Secondary | ICD-10-CM | POA: Diagnosis not present

## 2019-11-08 NOTE — Progress Notes (Signed)
Slocumb, Vandemere ([id number]) Visit Report for 11/08/2019 Arrival Information Details Patient Name: Date of Service: [name], [name] 11/08/2019 8:00 AM Medical Record [id number] Patient Account Number: [id number] Date of Birth/Sex: Treating RN: 11/06/1950 (69 y.o. [name] Primary Care Khristian Seals: [name] Other Clinician: Referring Nishawn Rotan: Treating Anastyn Ayars/Extender:Robson, Wayland Denis, Clelia Schaumann in Treatment: 3 Visit Information History Since Last Visit Added or deleted any medications: No Patient Arrived: Ambulatory Any new allergies or adverse reactions: No Arrival Time: 08:10 Had a fall or experienced change in No Accompanied By: self activities of daily living that may affect Transfer Assistance: None risk of falls: Patient Identification Verified: Yes Signs or symptoms of abuse/neglect No Secondary Verification Process Yes since last visito Completed: Hospitalized since last visit: No Patient Requires Transmission- No Implantable device outside of the clinic No Based Precautions: excluding Patient Has Alerts: Yes cellular tissue based products placed in Patient Alerts: Patient on Blood the center Thinner since last visit: Has Dressing in Place as Prescribed: Yes Has Footwear/Offloading in Place as Yes Prescribed: Left: Total Contact Cast Pain Present Now: No Electronic Signature(s) Signed: 11/08/2019 5:42:35 PM By: Deon Pilling Entered By: Deon Pilling on 11/08/2019 08:17:28 -------------------------------------------------------------------------------- Encounter Discharge Information Details Patient Name: Date of Service: [name] 11/08/2019 8:00 AM Medical Record [id number] Patient Account Number: [id number] Date of Birth/Sex: Treating RN: 1950/10/11 (69 y.o. [name] Primary Care Dung Salinger: [name] Other Clinician: Referring Havier Deeb: Treating Kytzia Gienger/Extender:Robson, Wayland Denis,  Clelia Schaumann in Treatment: 3 Encounter Discharge Information Items Post Procedure Vitals Discharge Condition: Stable Temperature (F): 98.7 Ambulatory Status: Ambulatory Pulse (bpm): 81 Discharge Destination: Home Respiratory Rate (breaths/min): 18 Transportation: Private Auto Blood Pressure (mmHg): 188/72 Accompanied By: self Schedule Follow-up Appointment: Yes Clinical Summary of Care: Electronic Signature(s) Signed: 11/08/2019 5:42:35 PM By: Deon Pilling Entered By: Deon Pilling on 11/08/2019 09:29:06 -------------------------------------------------------------------------------- Lower Extremity Assessment Details Patient Name: Date of Service: [name], [name] 11/08/2019 8:00 AM Medical Record [id number] Patient Account Number: [id number] Date of Birth/Sex: Treating RN: March 09, 1951 (69 y.o. [name] Primary Care Aja Bolander: [name] Other Clinician: Referring Quoc Tome: Treating Ladonne Sharples/Extender:Robson, Wayland Denis, Clelia Schaumann in Treatment: 3 Edema Assessment Assessed: [Left: Yes] [Right: No] Edema: [Left: N] [Right: o] Calf Left: Right: Point of Measurement: 41 cm From Medial Instep 33.5 cm cm Ankle Left: Right: Point of Measurement: 9 cm From Medial Instep 22 cm cm Vascular Assessment Pulses: Dorsalis Pedis Palpable: [Left:Yes] Electronic Signature(s) Signed: 11/08/2019 5:42:35 PM By: Deon Pilling Entered By: Deon Pilling on 11/08/2019 08:19:42 -------------------------------------------------------------------------------- [place] Details Patient Name: Date of Service: [name] 11/08/2019 8:00 AM Medical Record [id number] Patient Account Number: [id number] Date of Birth/Sex: Treating RN: [birth year] (69 y.o. [name] Primary Care Ravon Mcilhenny: [name] Other Clinician: Referring Lis Savitt: Treating Cassidi Modesitt/Extender:Robson, Wayland Denis, Clelia Schaumann in Treatment: 3 Active  Inactive Nutrition Nursing Diagnoses: Impaired glucose control: actual or potential Potential for alteratiion in Nutrition/Potential for imbalanced nutrition Goals: Patient/caregiver will maintain therapeutic glucose control Date Initiated: 10/18/2019 Target Resolution Date: 11/15/2019 Goal Status: Active Interventions: Assess HgA1c results as ordered upon admission and as needed Assess patient nutrition upon admission and as needed per policy Provide education on elevated blood sugars and impact on wound healing Treatment Activities: Patient referred to Primary Care Physician for further nutritional evaluation : 10/18/2019 Notes: Wound/Skin Impairment Nursing Diagnoses: Impaired tissue integrity Knowledge deficit related to ulceration/compromised skin integrity Goals: Patient/caregiver will verbalize understanding of skin care regimen Date Initiated: 10/18/2019 Target Resolution Date: 11/15/2019 Goal Status: Active  Ulcer/skin breakdown will have a volume reduction of 30% by week 4 Date Initiated: 10/18/2019 Target Resolution Date: 11/15/2019 Goal Status: Active Interventions: Assess patient/caregiver ability to obtain necessary supplies Assess patient/caregiver ability to perform ulcer/skin care regimen upon admission and as needed Assess ulceration(s) every visit Provide education on ulcer and skin care Treatment Activities: Skin care regimen initiated : 10/18/2019 Topical wound management initiated : 10/18/2019 Notes: Electronic Signature(s) Signed: 11/08/2019 5:41:24 PM By: Kela Millin Entered By: Kela Millin on 11/08/2019 08:32:04 -------------------------------------------------------------------------------- Pain Assessment Details Patient Name: Date of Service: [name] 11/08/2019 8:00 AM Medical Record [id number] Patient Account Number: [id number] Date of Birth/Sex: Treating RN: [birth year] (68 y.o. [name] Primary Care Arkin Imran: [name] Other Clinician: Referring Keala Drum: Treating Deidrea Gaetz/Extender:Robson, Wayland Denis, Clelia Schaumann in Treatment: 3 Active Problems Location of Pain Severity and Description of Pain Patient Has Paino No Site Locations Rate the pain. Current Pain Level: 0 Pain Management and Medication Current Pain Management: Medication: No Cold Application: No Rest: No Massage: No Activity: No T.E.N.S.: No Heat Application: No Leg drop or elevation: No Is the Current Pain Management Adequate: Adequate How does your wound impact your activities of daily livingo Sleep: No Bathing: No Appetite: No Relationship With Others: No Bladder Continence: No Emotions: No Bowel Continence: No Work: No Toileting: No Drive: No Dressing: No Hobbies: No Electronic Signature(s) Signed: 11/08/2019 5:42:35 PM By: Deon Pilling Entered By: Deon Pilling on 11/08/2019 08:19:30 -------------------------------------------------------------------------------- Patient/Caregiver Education Details Patient Name: Date of Service: [name], [name] L. 3/26/2021andnbsp8:00 AM Medical Record [id number] Patient Account Number: [id number] Date of Birth/Gender: Treating RN: 07/08/1951 (69 y.o. [name] Primary Care Physician: [name] Other Clinician: Referring Physician: Treating Physician/Extender:Robson, Wayland Denis, Clelia Schaumann in Treatment: 3 Education Assessment Education Provided To: Patient Education Topics Provided Elevated Blood Sugar/ Impact on Healing: Methods: Explain/Verbal Responses: State content correctly Wound/Skin Impairment: Methods: Explain/Verbal Responses: State content correctly Electronic Signature(s) Signed: 11/08/2019 5:41:24 PM By: Kela Millin Entered By: Kela Millin on 11/08/2019 08:32:57 -------------------------------------------------------------------------------- Wound Assessment Details Patient Name: Date of Service: [name]. 11/08/2019 8:00 AM Medical Record [id number] Patient Account Number: [id number] Date of Birth/Sex: Treating RN: Jun 01, 1951 (69 y.o. [name] Primary Care Aracelli Woloszyn: [name] Other Clinician: Referring Miria Cappelli: Treating Amear Strojny/Extender:Robson, Wayland Denis, Clelia Schaumann in Treatment: 3 Wound Status Wound Number: 1 Primary Diabetic Wound/Ulcer of the Lower Extremity Etiology: Wound Location: Left Foot - Lateral Wound Open Wounding Event: Gradually Appeared Status: Date Acquired: 08/15/2018 Comorbid Congestive Heart Failure, Hypertension, Weeks Of Treatment: 3 History: Peripheral Venous Disease, Type II Diabetes Clustered Wound: No Wound Measurements Length: (cm) 1.4 Width: (cm) 1.6 Depth: (cm) 0.2 Area: (cm) 1.759 Volume: (cm) 0.352 Wound Description Classification: Grade 2 Wound Margin: Flat and Intact Exudate Amount: Medium Exudate Type: Serosanguineous Exudate Color: red, brown Wound Bed Granulation Amount: Large (67-100%) Granulation Quality: Red Necrotic Amount: Small (1-33%) Necrotic Quality: Adherent Slough fter Cleansing: No ino Yes Exposed Structure sed: No Subcutaneous Tissue) Exposed: Yes sed: No sed: No ed: No d: No % Reduction in Area: 37.8% % Reduction in Volume: 75.1% Epithelialization: Small (1-33%) Tunneling: No Undermining: No Foul Odor A Slough/Fibr Fascia Expo Fat Layer ( Tendon Expo Muscle Expo Joint Expos Bone Expose Treatment Notes Wound #1 (Left, Lateral Foot) 1. Cleanse With Wound Cleanser Soap and water 3. Primary Dressing Applied Calcium Alginate Ag 4. Secondary Dressing Dry Gauze Foam 5. Secured With Medipore tape 7. Footwear/Offloading device applied Total Contact Cast Notes foam donut. TCC applied by MD.  Electronic Signature(s) Signed: 11/08/2019 5:42:35 PM By: Deon Pilling Entered By: Deon Pilling on 11/08/2019  08:20:19 -------------------------------------------------------------------------------- Wound Assessment Details Patient Name: Date of Service: [name], [name] 11/08/2019 8:00 AM Medical Record [id number] Patient Account Number: [id number] Date of Birth/Sex: Treating RN: 07/23/1951 (69 y.o. [name] Primary Care Shreeya Recendiz: [name] Other Clinician: Referring Sholanda Croson: Treating Luva Metzger/Extender:Robson, Wayland Denis, Clelia Schaumann in Treatment: 3 Wound Status Wound Number: 2 Primary Diabetic Wound/Ulcer of the Lower Etiology: Extremity Wound Location: Left, Plantar Foot Wound Status: Healed - Epithelialized Wounding Event: Gradually Appeared Date Acquired: 10/15/2018 Weeks Of Treatment: 3 Clustered Wound: No Wound Measurements Length: (cm) 0 % Reduction Width: (cm) 0 % Reduction Depth: (cm) 0 Area: (cm) 0 Volume: (cm) 0 Wound Description Classification: Grade 2 in Area: 100% in Volume: 100% Electronic Signature(s) Signed: 11/08/2019 5:42:35 PM By: Deon Pilling Entered By: Deon Pilling on 11/08/2019 08:19:52 -------------------------------------------------------------------------------- Vitals Details Patient Name: Date of Service: [name] 11/08/2019 8:00 AM Medical Record [id number] Patient Account Number: [id number] Date of Birth/Sex: Treating RN: April 19, 1951 (69 y.o. [name] Primary Care Dmonte Maher: [name] Other Clinician: Referring Jyl [place]: Treating Nyomie Ehrlich/Extender:Robson, Wayland Denis, Clelia Schaumann in Treatment: 3 Vital Signs Time Taken: 08:13 Temperature (F): 98.7 Height (in): 67 Pulse (bpm): 81 Weight (lbs): 173 Respiratory Rate (breaths/min): 18 Body Mass Index (BMI): 27.1 Blood Pressure (mmHg): 188/72 Reference Range: 80 - 120 mg / dl Electronic Signature(s) Signed: 11/08/2019 5:42:35 PM By: Deon Pilling Entered By: Deon Pilling on 11/08/2019 08:18:05

## 2019-11-09 NOTE — Progress Notes (Signed)
[name], [name] ([id number]) Visit Report for 11/08/2019 Debridement Details Patient Name: Date of Service: [name] [name] 11/08/2019 8:00 AM Medical Record [id number] Patient Account Number: [id number] Date of Birth/Sex: Treating RN: [birth year] (69 y.o. [name] [name] Primary Care Provider: [name] Other Clinician: Referring Provider: Treating Provider/Extender:Mickeal Daws, Wayland Denis, Clelia Schaumann in Treatment: 3 Debridement Performed for Wound #1 Left,Lateral Foot Assessment: Performed By: Physician Ricard Dillon., MD Debridement Type: Debridement Severity of Tissue Pre Fat layer exposed Debridement: Level of Consciousness (Pre- Awake and Alert procedure): Pre-procedure Verification/Time Out Taken: Yes - 08:38 Start Time: 08:38 Pain Control: Other : benzocaine, 20% Total Area Debrided (L x W): 1.4 (cm) x 1.6 (cm) = 2.24 (cm) Tissue and other material Viable, Non-Viable, Slough, Subcutaneous, Slough debrided: Level: Skin/Subcutaneous Tissue Debridement Description: Excisional Instrument: Curette Bleeding: Minimum Hemostasis Achieved: Pressure End Time: 08:39 Procedural Pain: 0 Post Procedural Pain: 0 Response to Treatment: Procedure was tolerated well Level of Consciousness Awake and Alert (Post-procedure): Post Debridement Measurements of Total Wound Length: (cm) 1.4 Width: (cm) 1.6 Depth: (cm) 0.2 Volume: (cm) 0.352 Character of Wound/Ulcer Post Improved Debridement: Severity of Tissue Post Debridement: Fat layer exposed Post Procedure Diagnosis Same as Pre-procedure Electronic Signature(s) Signed: 11/08/2019 5:41:24 PM By: Kela Millin Signed: 11/09/2019 8:02:09 AM By: Linton Ham MD Entered By: Kela Millin on 11/08/2019 08:39:55 -------------------------------------------------------------------------------- HPI Details Patient Name: Date of Service: [name] [name] 11/08/2019 8:00 AM Medical Record  [id number] Patient Account Number: [id number] Date of Birth/Sex: Treating RN: 08-13-1951 (69 y.o. [name] [name] Primary Care Provider: [name] Other Clinician: Referring Provider: Treating Provider/Extender:Kyani Simkin, Wayland Denis, Clelia Schaumann in Treatment: 3 History of Present Illness HPI Description: ADMISSION 10/18/2019 Patient is a 69 year old man with type 2 diabetes peripheral neuropathy and a Charcot foot. The problem I think began in March 2020 just about a year ago when he was admitted to hospital from 10/19/2018 through 10/24/2018 with a wound on his left lateral foot and intense cellulitis. MRI did not suggest osteomyelitis but did do note a draining sinus. He apparently received a prolonged course of antibiotics. His hemoglobin A1c at that time was 13.9. He was referred to Dr. [name] where he appears to been followed for probably 5 or 6 months. He was subsequently referred himself to Dr. [name] of podiatry. He has been offloaded in a surgical shoe with a Pegasys sole. He has been mostly using Medihoney and episodic debridement of intense surrounding callus. He has been referred here for further management of this difficult the heel wound area and consideration of hyperbaric oxygen The patient had an angiogram in November. He did not have any proximal stenoses but he did have a 70 to 80% peroneal artery occlusion as well as an 80 to 95% stenosis of the anterior tibia which underwent an angioplasty. Follow-up ABIs were done on 07/02/2019 showed an ABI on the right of 1.26 on the left at 1.28 with TBI's of 0.65 and 0.67 respectively. Waveforms were triphasic Past medical history includes type 2 diabetes with neuropathy, PAD and retinopathy, prostate cancer, gastroesophageal reflux disease, chronic kidney disease, small bowel obstruction 10/25/2019. Patient's x-ray of the left foot from last time showed no evidence of osteomyelitis. Chronic Charcot/neuropathic changes as  expected. Notable soft tissue ulcer involving the plantar aspect underlying the forefoot. The patient actually has 2 wounds. One on the mid forefoot limited to breakdown of skin. The larger area is actually on the lateral foot. We have been using silver alginate to both wound areas. 3/15;  patient came back for the obligatory first total contact cast change. He was apparently walking on the cast without the boot but otherwise tolerated it well and per our intake nurse the wounds look satisfactory. He will be back later in the week for his wound care visit 3/18; patient's wound on the plantar foot looks smaller of lateral foot about the same. Both required debridement. There was some macerated tissue between the 2 wounds which were also removed. We have been using silver alginate. 3/26 1 week follow-up. Total contact cast. Plantar foot wound has closed over. The area on the lateral part of his foot is still open but smaller. Using silver alginate on the wounds Electronic Signature(s) Signed: 11/09/2019 8:02:09 AM By: Linton Ham MD Entered By: Linton Ham on 11/08/2019 09:19:36 -------------------------------------------------------------------------------- Physical Exam Details Patient Name: Date of Service: [name] [name] 11/08/2019 8:00 AM Medical Record [id number] Patient Account Number: [id number] Date of Birth/Sex: Treating RN: October 15, 1950 (69 y.o. [name] [name] Primary Care Provider: [name] Other Clinician: Referring Provider: Treating Provider/Extender:Angelin Cutrone, Wayland Denis, Clelia Schaumann in Treatment: 3 Constitutional Patient is hypertensive.. Pulse regular and within target range for patient.[name] Respirations regular, non-labored and within target range.. Temperature is normal and within the target range for the patient.[name] Appears in no distress. Notes Wound exam; the wound on the plantar part of his foot is healed over. The lateral foot wound requires  debridement with a #3 curette hemostasis with direct pressure I removed necrotic material from the wound surface. Otherwise this is epithelialized and looking smaller Electronic Signature(s) Signed: 11/09/2019 8:02:09 AM By: Linton Ham MD Entered By: Linton Ham on 11/08/2019 09:20:56 -------------------------------------------------------------------------------- Physician Orders Details Patient Name: Date of Service: [name] [name]. 11/08/2019 8:00 AM Medical Record [id number] Patient Account Number: [id number] Date of Birth/Sex: Treating RN: 08-May-1951 (69 y.o. [name] [name] Primary Care Provider: [name] Other Clinician: Referring Provider: Treating Provider/Extender:Ani Deoliveira, Wayland Denis, Clelia Schaumann in Treatment: 3 Verbal / Phone Orders: No Diagnosis Coding ICD-10 Coding Code Description [phone] Non-pressure chronic ulcer of other part of left foot with fat layer exposed E11.51 Type 2 diabetes mellitus with diabetic peripheral angiopathy without gangrene E11.621 Type 2 diabetes mellitus with foot ulcer L97.521 Non-pressure chronic ulcer of other part of left foot limited to breakdown of skin M14.672 Charcot's joint, left ankle and foot E11.40 Type 2 diabetes mellitus with diabetic neuropathy, unspecified Follow-up Appointments Return Appointment in 1 week. - Friday afternoon Dressing Change Frequency Wound #1 Left,Lateral Foot Do not change entire dressing for one week. Skin Barriers/Peri-Wound Care Barrier cream - to macerated periwound area. Wound Cleansing Wound #1 Left,Lateral Foot May shower with protection. - cast protector Primary Wound Dressing Wound #1 Left,Lateral Foot Calcium Alginate with Silver Secondary Dressing Wound #1 Left,Lateral Foot Foam - donut Dry Gauze - tape Other: - cushion area to plantar foot Off-Loading Total Contact Cast to Left [place] skilled nursing for wound  care. - [facility], HOLD for wound care due to cast placed on. Electronic Signature(s) Signed: 11/08/2019 5:41:24 PM By: Kela Millin Signed: 11/09/2019 8:02:09 AM By: Linton Ham MD Entered By: Kela Millin on 11/08/2019 08:41:16 -------------------------------------------------------------------------------- Problem List Details Patient Name: Date of Service: [name] [name] 11/08/2019 8:00 AM Medical Record [id number] Patient Account Number: [id number] Date of Birth/Sex: Treating RN: Oct 27, 1950 (69 y.o. [name] [name] Primary Care Provider: [name] Other Clinician: Referring Provider: Treating Provider/Extender:Selvin Yun, Wayland Denis, Clelia Schaumann in Treatment: 3 Active Problems ICD-10 Evaluated Encounter Code Description Active  Date Today Diagnosis L97.522 Non-pressure chronic ulcer of other part of left foot 10/18/2019 No Yes with fat layer exposed E11.51 Type 2 diabetes mellitus with diabetic peripheral 10/18/2019 No Yes angiopathy without gangrene E11.621 Type 2 diabetes mellitus with foot ulcer 10/18/2019 No Yes L97.521 Non-pressure chronic ulcer of other part of left foot 10/25/2019 No Yes limited to breakdown of skin M14.672 Charcot's joint, left ankle and foot 10/18/2019 No Yes E11.40 Type 2 diabetes mellitus with diabetic neuropathy, 10/18/2019 No Yes unspecified Inactive Problems Resolved Problems Electronic Signature(s) Signed: 11/08/2019 5:41:24 PM By: Kela Millin Signed: 11/09/2019 8:02:09 AM By: Linton Ham MD Entered By: Kela Millin on 11/08/2019 08:31:43 -------------------------------------------------------------------------------- Progress Note Details Patient Name: Date of Service: [name] [name] 11/08/2019 8:00 AM Medical Record [id number] Patient Account Number: [id number] Date of Birth/Sex: Treating RN: [birth year] (69 y.o. [name] [name] Primary Care Provider: [name] Other  Clinician: Referring Provider: Treating Provider/Extender:Saheed Carrington, Wayland Denis, Clelia Schaumann in Treatment: 3 Subjective History of Present Illness (HPI) ADMISSION 10/18/2019 Patient is a 69 year old man with type 2 diabetes peripheral neuropathy and a Charcot foot. The problem I think began in March 2020 just about a year ago when he was admitted to hospital from 10/19/2018 through 10/24/2018 with a wound on his left lateral foot and intense cellulitis. MRI did not suggest osteomyelitis but did do note a draining sinus. He apparently received a prolonged course of antibiotics. His hemoglobin A1c at that time was 13.9. He was referred to Dr. [name] where he appears to been followed for probably 5 or 6 months. He was subsequently referred himself to Dr. [name] of podiatry. He has been offloaded in a surgical shoe with a Pegasys sole. He has been mostly using Medihoney and episodic debridement of intense surrounding callus. He has been referred here for further management of this difficult the heel wound area and consideration of hyperbaric oxygen The patient had an angiogram in November. He did not have any proximal stenoses but he did have a 70 to 80% peroneal artery occlusion as well as an 80 to 95% stenosis of the anterior tibia which underwent an angioplasty. Follow-up ABIs were done on 07/02/2019 showed an ABI on the right of 1.26 on the left at 1.28 with TBI's of 0.65 and 0.67 respectively. Waveforms were triphasic Past medical history includes type 2 diabetes with neuropathy, PAD and retinopathy, prostate cancer, gastroesophageal reflux disease, chronic kidney disease, small bowel obstruction 10/25/2019. Patient's x-ray of the left foot from last time showed no evidence of osteomyelitis. Chronic Charcot/neuropathic changes as expected. Notable soft tissue ulcer involving the plantar aspect underlying the forefoot. The patient actually has 2 wounds. One on the mid forefoot limited to breakdown  of skin. The larger area is actually on the lateral foot. We have been using silver alginate to both wound areas. 3/15; patient came back for the obligatory first total contact cast change. He was apparently walking on the cast without the boot but otherwise tolerated it well and per our intake nurse the wounds look satisfactory. He will be back later in the week for his wound care visit 3/18; patient's wound on the plantar foot looks smaller of lateral foot about the same. Both required debridement. There was some macerated tissue between the 2 wounds which were also removed. We have been using silver alginate. 3/26 1 week follow-up. Total contact cast. Plantar foot wound has closed over. The area on the lateral part of his foot is still open but smaller. Using silver alginate on  the wounds Objective Constitutional Patient is hypertensive.. Pulse regular and within target range for patient.[name] Respirations regular, non-labored and within target range.. Temperature is normal and within the target range for the patient.[name] Appears in no distress. Vitals Time Taken: 8:13 AM, Height: 67 in, Weight: 173 lbs, BMI: 27.1, Temperature: 98.7 F, Pulse: 81 bpm, Respiratory Rate: 18 breaths/min, Blood Pressure: 188/72 mmHg. General Notes: Wound exam; the wound on the plantar part of his foot is healed over. The lateral foot wound requires debridement with a #3 curette hemostasis with direct pressure I removed necrotic material from the wound surface. Otherwise this is epithelialized and looking smaller Integumentary (Hair, Skin) Wound #1 status is Open. Original cause of wound was Gradually Appeared. The wound is located on the Left,Lateral Foot. The wound measures 1.4cm length x 1.6cm width x 0.2cm depth; 1.759cm^2 area and 0.352cm^3 volume. There is Fat Layer (Subcutaneous Tissue) Exposed exposed. There is no tunneling or undermining noted. There is a medium amount of serosanguineous drainage noted. The  wound margin is flat and intact. There is large (67-100%) red granulation within the wound bed. There is a small (1-33%) amount of necrotic tissue within the wound bed including Adherent Slough. Wound #2 status is Healed - Epithelialized. Original cause of wound was Gradually Appeared. The wound is located on the [place]. The wound measures 0cm length x 0cm width x 0cm depth; 0cm^2 area and 0cm^3 volume. Assessment Active Problems ICD-10 Non-pressure chronic ulcer of other part of left foot with fat layer exposed Type 2 diabetes mellitus with diabetic peripheral angiopathy without gangrene Type 2 diabetes mellitus with foot ulcer Non-pressure chronic ulcer of other part of left foot limited to breakdown of skin Charcot's joint, left ankle and foot Type 2 diabetes mellitus with diabetic neuropathy, unspecified Procedures Wound #1 Pre-procedure diagnosis of Wound #1 is a Diabetic Wound/Ulcer of the Lower Extremity located on the Left,Lateral Foot .Severity of Tissue Pre Debridement is: Fat layer exposed. There was a Excisional Skin/Subcutaneous Tissue Debridement with a total area of 2.24 sq cm performed by Ricard Dillon., MD. With the following instrument(s): Curette to remove Viable and Non-Viable tissue/material. Material removed includes Subcutaneous Tissue and Slough and after achieving pain control using Other (benzocaine, 20%). No specimens were taken. A time out was conducted at 08:38, prior to the start of the procedure. A Minimum amount of bleeding was controlled with Pressure. The procedure was tolerated well with a pain level of 0 throughout and a pain level of 0 following the procedure. Post Debridement Measurements: 1.4cm length x 1.6cm width x 0.2cm depth; 0.352cm^3 volume. Character of Wound/Ulcer Post Debridement is improved. Severity of Tissue Post Debridement is: Fat layer exposed. Post procedure Diagnosis Wound #1: Same as Pre-Procedure Pre-procedure  diagnosis of Wound #1 is a Diabetic Wound/Ulcer of the Lower Extremity located on the Left,Lateral Foot . There was a Total Contact Cast Procedure by Ricard Dillon., MD. Post procedure Diagnosis Wound #1: Same as Pre-Procedure Plan Follow-up Appointments: Return Appointment in 1 week. - Friday afternoon Dressing Change Frequency: Wound #1 Left,Lateral Foot: Do not change entire dressing for one week. Skin Barriers/Peri-Wound Care: Barrier cream - to macerated periwound area. Wound Cleansing: Wound #1 Left,Lateral Foot: May shower with protection. - cast protector Primary Wound Dressing: Wound #1 Left,Lateral Foot: Calcium Alginate with Silver Secondary Dressing: Wound #1 Left,Lateral Foot: Foam - donut Dry Gauze - tape Other: - cushion area to plantar foot Off-Loading: Total Contact Cast to Left Lower Extremity Home Health: Continue  Home Health skilled nursing for wound care. - [facility], HOLD for wound care due to cast placed on. 1. Silver alginate to the left lateral foot 2. Cushion the plantar foot wound 3. I put him back in a total contact cast. We may be able to transition him into his surgical shoe next week even if the area on the lateral foot is not closed. I asked him to bring his surgical shoe for next week Electronic Signature(s) Signed: 11/09/2019 8:02:09 AM By: Linton Ham MD Entered By: Linton Ham on 11/08/2019 09:22:03 -------------------------------------------------------------------------------- Total Contact Cast Details Patient Name: Date of Service: [name] [name] 11/08/2019 8:00 AM Medical Record [id number] Patient Account Number: [id number] Date of Birth/Sex: Treating RN: 1951-02-09 (69 y.o. [name] [name] Primary Care Provider: [name] Other Clinician: Referring Provider: Treating Provider/Extender:Arielys Wandersee, Wayland Denis, Clelia Schaumann in Treatment: 3 Total Contact Cast Applied for Wound Assessment: Wound #1  Left,Lateral Foot Performed By: Physician Ricard Dillon., MD Post Procedure Diagnosis Same as Pre-procedure Electronic Signature(s) Signed: 11/08/2019 5:41:24 PM By: Kela Millin Signed: 11/09/2019 8:02:09 AM By: Linton Ham MD Entered By: Kela Millin on 11/08/2019 08:38:00 -------------------------------------------------------------------------------- SuperBill Details Patient Name: Date of Service: GREYDEN, BESECKER 11/08/2019 Medical Record [id number] Patient Account Number: [id number] Date of Birth/Sex: Treating RN: 08-11-1951 (69 y.o. [name] [name] Primary Care Provider: [name] Other Clinician: Referring Provider: Treating Provider/Extender:Jamin Humphries, Wayland Denis, Clelia Schaumann in Treatment: 3 Diagnosis Coding ICD-10 Codes Code Description [phone] Non-pressure chronic ulcer of other part of left foot with fat layer exposed E11.51 Type 2 diabetes mellitus with diabetic peripheral angiopathy without gangrene E11.621 Type 2 diabetes mellitus with foot ulcer L97.521 Non-pressure chronic ulcer of other part of left foot limited to breakdown of skin M14.672 Charcot's joint, left ankle and foot E11.40 Type 2 diabetes mellitus with diabetic neuropathy, unspecified Facility Procedures CPT4 Code Description: [id number] - DEB SUBQ TISSUE 20 SQ CM/< ICD-10 Diagnosis Description E11.40 Type 2 diabetes mellitus with diabetic neuropathy, unspecif L97.522 Non-pressure chronic ulcer of other part of left foot with Modifier: ied fat layer ex Quantity: 1 posed Physician Procedures CPT4 Code Description: [id number] - WC PHYS SUBQ TISS 20 SQ CM ICD-10 Diagnosis Description E11.40 Type 2 diabetes mellitus with diabetic neuropathy, unspecif L97.522 Non-pressure chronic ulcer of other part of left foot with Modifier: ied fat layer exp Quantity: 1 osed Electronic Signature(s) Signed: 11/09/2019 8:02:09 AM By: Linton Ham MD Entered By: Linton Ham on 11/08/2019 09:22:26

## 2019-11-11 NOTE — Telephone Encounter (Signed)
Event Monitor cancelled - switched to ZIO - No baseline - monitor returned 07/08/19

## 2019-11-15 ENCOUNTER — Other Ambulatory Visit: Payer: Self-pay

## 2019-11-15 ENCOUNTER — Encounter (HOSPITAL_BASED_OUTPATIENT_CLINIC_OR_DEPARTMENT_OTHER): Payer: Medicare Other | Attending: Physician Assistant | Admitting: Physician Assistant

## 2019-11-15 DIAGNOSIS — N189 Chronic kidney disease, unspecified: Secondary | ICD-10-CM | POA: Diagnosis not present

## 2019-11-15 DIAGNOSIS — L97522 Non-pressure chronic ulcer of other part of left foot with fat layer exposed: Secondary | ICD-10-CM | POA: Diagnosis not present

## 2019-11-15 DIAGNOSIS — E1122 Type 2 diabetes mellitus with diabetic chronic kidney disease: Secondary | ICD-10-CM | POA: Diagnosis not present

## 2019-11-15 DIAGNOSIS — E11621 Type 2 diabetes mellitus with foot ulcer: Secondary | ICD-10-CM | POA: Insufficient documentation

## 2019-11-15 NOTE — Progress Notes (Addendum)
Allegretto, [place] ([id number]) Visit Report for 11/15/2019 Chief Complaint Document Details Patient Name: Date of Service: [name], [name] 11/15/2019 1:00 PM Medical Record [id number] Patient Account Number: [id number] Date of Birth/Sex: Treating RN: September 16, 1950 (69 y.o. [name] Primary Care Provider: [name] Other Clinician: Referring Provider: Treating Provider/Extender:Stone III, Jeanette Caprice, Irma Weeks in Treatment: 4 Information Obtained from: Patient Chief Complaint 10/18/2019; patient is here for review of wounds on the plantar left foot and on the lateral left foot Electronic Signature(s) Signed: 11/15/2019 1:32:08 PM By: Worthy Keeler PA-C Entered By: Worthy Keeler on 11/15/2019 13:32:07 -------------------------------------------------------------------------------- HPI Details Patient Name: Date of Service: [name] Anes. 11/15/2019 1:00 PM Medical Record [id number] Patient Account Number: [id number] Date of Birth/Sex: Treating RN: 01/04/1951 (69 y.o. [name] Primary Care Provider: [name] Other Clinician: Referring Provider: Treating Provider/Extender:Stone III, Jeanette Caprice, Irma Weeks in Treatment: 4 History of Present Illness HPI Description: ADMISSION 10/18/2019 Patient is a 69 year old man with type 2 diabetes peripheral neuropathy and a Charcot foot. The problem I think began in March 2020 just about a year ago when he was admitted to hospital from 10/19/2018 through 10/24/2018 with a wound on his left lateral foot and intense cellulitis. MRI did not suggest osteomyelitis but did do note a draining sinus. He apparently received a prolonged course of antibiotics. His hemoglobin A1c at that time was 13.9. He was referred to Dr. [name] where he appears to been followed for probably 5 or 6 months. He was subsequently referred himself to Dr. [name] of podiatry. He has been offloaded in a surgical shoe with a Pegasys  sole. He has been mostly using Medihoney and episodic debridement of intense surrounding callus. He has been referred here for further management of this difficult the heel wound area and consideration of hyperbaric oxygen The patient had an angiogram in November. He did not have any proximal stenoses but he did have a 70 to 80% peroneal artery occlusion as well as an 80 to 95% stenosis of the anterior tibia which underwent an angioplasty. Follow-up ABIs were done on 07/02/2019 showed an ABI on the right of 1.26 on the left at 1.28 with TBI's of 0.65 and 0.67 respectively. Waveforms were triphasic Past medical history includes type 2 diabetes with neuropathy, PAD and retinopathy, prostate cancer, gastroesophageal reflux disease, chronic kidney disease, small bowel obstruction 10/25/2019. Patient's x-ray of the left foot from last time showed no evidence of osteomyelitis. Chronic Charcot/neuropathic changes as expected. Notable soft tissue ulcer involving the plantar aspect underlying the forefoot. The patient actually has 2 wounds. One on the mid forefoot limited to breakdown of skin. The larger area is actually on the lateral foot. We have been using silver alginate to both wound areas. 3/15; patient came back for the obligatory first total contact cast change. He was apparently walking on the cast without the boot but otherwise tolerated it well and per our intake nurse the wounds look satisfactory. He will be back later in the week for his wound care visit 3/18; patient's wound on the plantar foot looks smaller of lateral foot about the same. Both required debridement. There was some macerated tissue between the 2 wounds which were also removed. We have been using silver alginate. 3/26 1 week follow-up. Total contact cast. Plantar foot wound has closed over. The area on the lateral part of his foot is still open but smaller. Using silver alginate on the wounds 11/15/2019 upon evaluation today  patient appears to  be doing well with regard to his wound. He is making progress and fortunately the wound is measuring smaller and looking better at this point. Fortunately there is no signs of active infection which is also good news we been using a total contact cast up to this point. Electronic Signature(s) Signed: 11/15/2019 3:09:58 PM By: Worthy Keeler PA-C Entered By: Worthy Keeler on 11/15/2019 15:09:58 -------------------------------------------------------------------------------- Physical Exam Details Patient Name: Date of Service: [name], [name] 11/15/2019 1:00 PM Medical Record [id number] Patient Account Number: [id number] Date of Birth/Sex: Treating RN: 06/26/1951 (69 y.o. [name] Primary Care Provider: [name] Other Clinician: Referring Provider: Treating Provider/Extender:Stone III, Jeanette Caprice, Irma Weeks in Treatment: 4 Constitutional Well-nourished and well-hydrated in no acute distress. Respiratory normal breathing without difficulty. Psychiatric this patient is able to make decisions and demonstrates good insight into disease process. Alert and Oriented x 3. pleasant and cooperative. Notes Patient's wound bed currently showed signs of good granulation at this time. No sharp debridement was necessary today with regard to the wound bed which is excellent news. I do believe continuation of the total contact cast would be appropriate. I do believe as well we can consider continuing the alginate at this point. Electronic Signature(s) Signed: 11/15/2019 3:10:30 PM By: Worthy Keeler PA-C Entered By: Worthy Keeler on 11/15/2019 15:10:30 -------------------------------------------------------------------------------- Physician Orders Details Patient Name: Date of Service: [name], [name] 11/15/2019 1:00 PM Medical Record [id number] Patient Account Number: [id number] Date of Birth/Sex: Treating RN: November 02, 1950 (69 y.o. [name] Primary Care Provider: [name] Other Clinician: Referring Provider: Treating Provider/Extender:Stone III, Jeanette Caprice, Irma Weeks in Treatment: 4 Verbal / Phone Orders: No Diagnosis Coding ICD-10 Coding Code Description [phone] Non-pressure chronic ulcer of other part of left foot with fat layer exposed E11.51 Type 2 diabetes mellitus with diabetic peripheral angiopathy without gangrene E11.621 Type 2 diabetes mellitus with foot ulcer L97.521 Non-pressure chronic ulcer of other part of left foot limited to breakdown of skin M14.672 Charcot's joint, left ankle and foot E11.40 Type 2 diabetes mellitus with diabetic neuropathy, unspecified Follow-up Appointments Return Appointment in 1 week. - Friday afternoon Dressing Change Frequency Wound #1 Left,Lateral Foot Do not change entire dressing for one week. Skin Barriers/Peri-Wound Care Barrier cream - to macerated periwound area. Wound Cleansing Wound #1 Left,Lateral Foot May shower with protection. - cast protector Primary Wound Dressing Wound #1 Left,Lateral Foot Calcium Alginate with Silver Secondary Dressing Wound #1 Left,Lateral Foot Foam - donut Dry Gauze - tape Other: - cushion area to plantar foot Off-Loading Total Contact Cast to Left [facility] skilled nursing for wound care. - [facility], HOLD for wound care due to cast placed on. Electronic Signature(s) Signed: 11/15/2019 4:46:14 PM By: Worthy Keeler PA-C Signed: 11/18/2019 4:55:48 PM By: Kela Millin Entered By: Kela Millin on 11/15/2019 14:32:41 -------------------------------------------------------------------------------- Problem List Details Patient Name: Date of Service: [name] Anes 11/15/2019 1:00 PM Medical Record [id number] Patient Account Number: [id number] Date of Birth/Sex: Treating RN: 14-Dec-1950 (69 y.o. [name] Primary Care Provider: [name] Other Clinician: Referring Provider: Treating Provider/Extender:Stone III, Jeanette Caprice, Irma Weeks in Treatment: 4 Active Problems ICD-10 Evaluated Encounter Code Description Active Date Today Diagnosis L97.522 Non-pressure chronic ulcer of other part of left foot 10/18/2019 No Yes with fat layer exposed E11.51 Type 2 diabetes mellitus with diabetic peripheral 10/18/2019 No Yes angiopathy without gangrene E11.621 Type 2 diabetes mellitus with foot ulcer 10/18/2019 No Yes L97.521 Non-pressure chronic  ulcer of other part of left foot 10/25/2019 No Yes limited to breakdown of skin M14.672 Charcot's joint, left ankle and foot 10/18/2019 No Yes E11.40 Type 2 diabetes mellitus with diabetic neuropathy, 10/18/2019 No Yes unspecified Inactive Problems Resolved Problems Electronic Signature(s) Signed: 11/15/2019 4:46:14 PM By: Worthy Keeler PA-C Signed: 11/18/2019 4:55:48 PM By: Kela Millin Previous Signature: 11/15/2019 1:32:02 PM Version By: Worthy Keeler PA-C Entered By: Kela Millin on 11/15/2019 14:32:25 -------------------------------------------------------------------------------- Progress Note Details Patient Name: Date of Service: [name] Anes 11/15/2019 1:00 PM Medical Record [id number] Patient Account Number: [id number] Date of Birth/Sex: Treating RN: [birth year] (69 y.o. [name] Primary Care Provider: [name] Other Clinician: Referring Provider: Treating Provider/Extender:Stone III, Jeanette Caprice, Irma Weeks in Treatment: 4 Subjective Chief Complaint Information obtained from Patient 10/18/2019; patient is here for review of wounds on the plantar left foot and on the lateral left foot History of Present Illness (HPI) ADMISSION 10/18/2019 Patient is a 69 year old man with type 2 diabetes peripheral neuropathy and a Charcot foot. The problem I think began in March 2020 just about a year ago when he was admitted to hospital from 10/19/2018  through 10/24/2018 with a wound on his left lateral foot and intense cellulitis. MRI did not suggest osteomyelitis but did do note a draining sinus. He apparently received a prolonged course of antibiotics. His hemoglobin A1c at that time was 13.9. He was referred to Dr. [name] where he appears to been followed for probably 5 or 6 months. He was subsequently referred himself to Dr. [name] of podiatry. He has been offloaded in a surgical shoe with a Pegasys sole. He has been mostly using Medihoney and episodic debridement of intense surrounding callus. He has been referred here for further management of this difficult the heel wound area and consideration of hyperbaric oxygen The patient had an angiogram in November. He did not have any proximal stenoses but he did have a 70 to 80% peroneal artery occlusion as well as an 80 to 95% stenosis of the anterior tibia which underwent an angioplasty. Follow-up ABIs were done on 07/02/2019 showed an ABI on the right of 1.26 on the left at 1.28 with TBI's of 0.65 and 0.67 respectively. Waveforms were triphasic Past medical history includes type 2 diabetes with neuropathy, PAD and retinopathy, prostate cancer, gastroesophageal reflux disease, chronic kidney disease, small bowel obstruction 10/25/2019. Patient's x-ray of the left foot from last time showed no evidence of osteomyelitis. Chronic Charcot/neuropathic changes as expected. Notable soft tissue ulcer involving the plantar aspect underlying the forefoot. The patient actually has 2 wounds. One on the mid forefoot limited to breakdown of skin. The larger area is actually on the lateral foot. We have been using silver alginate to both wound areas. 3/15; patient came back for the obligatory first total contact cast change. He was apparently walking on the cast without the boot but otherwise tolerated it well and per our intake nurse the wounds look satisfactory. He will be back later in the week for his wound  care visit 3/18; patient's wound on the plantar foot looks smaller of lateral foot about the same. Both required debridement. There was some macerated tissue between the 2 wounds which were also removed. We have been using silver alginate. 3/26 1 week follow-up. Total contact cast. Plantar foot wound has closed over. The area on the lateral part of his foot is still open but smaller. Using silver alginate on the wounds 11/15/2019 upon evaluation today patient appears to be  doing well with regard to his wound. He is making progress and fortunately the wound is measuring smaller and looking better at this point. Fortunately there is no signs of active infection which is also good news we been using a total contact cast up to this point. Objective Constitutional Well-nourished and well-hydrated in no acute distress. Vitals Time Taken: 1:51 PM, Height: 67 in, Source: Stated, Weight: 173 lbs, Source: Stated, BMI: 27.1, Temperature: 98.4 F, Pulse: 70 bpm, Respiratory Rate: 18 breaths/min, Blood Pressure: 180/70 mmHg. Respiratory normal breathing without difficulty. Psychiatric this patient is able to make decisions and demonstrates good insight into disease process. Alert and Oriented x 3. pleasant and cooperative. General Notes: Patient's wound bed currently showed signs of good granulation at this time. No sharp debridement was necessary today with regard to the wound bed which is excellent news. I do believe continuation of the total contact cast would be appropriate. I do believe as well we can consider continuing the alginate at this point. Integumentary (Hair, Skin) Wound #1 status is Open. Original cause of wound was Gradually Appeared. The wound is located on the Left,Lateral Foot. The wound measures 1.1cm length x 1.3cm width x 0.2cm depth; 1.123cm^2 area and 0.225cm^3 volume. There is Fat Layer (Subcutaneous Tissue) Exposed exposed. There is no tunneling or undermining noted. There is a  medium amount of serosanguineous drainage noted. The wound margin is flat and intact. There is large (67-100%) red granulation within the wound bed. There is a small (1-33%) amount of necrotic tissue within the wound bed including Adherent Slough. Assessment Active Problems ICD-10 Non-pressure chronic ulcer of other part of left foot with fat layer exposed Type 2 diabetes mellitus with diabetic peripheral angiopathy without gangrene Type 2 diabetes mellitus with foot ulcer Non-pressure chronic ulcer of other part of left foot limited to breakdown of skin Charcot's joint, left ankle and foot Type 2 diabetes mellitus with diabetic neuropathy, unspecified Procedures Wound #1 Pre-procedure diagnosis of Wound #1 is a Diabetic Wound/Ulcer of the Lower Extremity located on the Left,Lateral Foot . There was a Total Contact Cast Procedure by Worthy Keeler, PA. Post procedure Diagnosis Wound #1: Same as Pre-Procedure Plan Follow-up Appointments: Return Appointment in 1 week. - Friday afternoon Dressing Change Frequency: Wound #1 Left,Lateral Foot: Do not change entire dressing for one week. Skin Barriers/Peri-Wound Care: Barrier cream - to macerated periwound area. Wound Cleansing: Wound #1 Left,Lateral Foot: May shower with protection. - cast protector Primary Wound Dressing: Wound #1 Left,Lateral Foot: Calcium Alginate with Silver Secondary Dressing: Wound #1 Left,Lateral Foot: Foam - donut Dry Gauze - tape Other: - cushion area to plantar foot Off-Loading: Total Contact Cast to Left Lower Extremity Home Health: [facility] skilled nursing for wound care. - [facility], HOLD for wound care due to cast placed on. 1. I would recommend currently that we go ahead and again continue with the silver alginate dressing the patient is in agreement with the plan. 2. We will also continue with his total contact cast which I did reapply today in the office as well. 3. I am also can  recommend that he continue to limit activity as much as possible. With that being said he can walk with the cast but he needs to again not be excessive with this. We will see patient back for reevaluation in 1 week here in the clinic. If anything worsens or changes patient will contact our office for additional recommendations. Electronic Signature(s) Signed: 11/15/2019 3:11:04 PM By: Melburn Hake,  Chyrel Taha PA-C Entered By: Worthy Keeler on 11/15/2019 15:11:03 -------------------------------------------------------------------------------- Total Contact Cast Details Patient Name: Date of Service: SHAKIM, FAITH 11/15/2019 1:00 PM Medical Record [id number] Patient Account Number: [id number] Date of Birth/Sex: Treating RN: 09-23-1950 (69 y.o. [name] Primary Care Provider: [name] Other Clinician: Referring Provider: Treating Provider/Extender:Stone III, Jeanette Caprice, Irma Weeks in Treatment: 4 Total Contact Cast Applied for Wound Assessment: Wound #1 Left,Lateral Foot Performed By: Physician Worthy Keeler, PA Post Procedure Diagnosis Same as Pre-procedure Electronic Signature(s) Signed: 11/15/2019 4:46:14 PM By: Worthy Keeler PA-C Signed: 11/18/2019 4:55:48 PM By: Kela Millin Entered By: Kela Millin on 11/15/2019 14:34:44 -------------------------------------------------------------------------------- SuperBill Details Patient Name: Date of Service: [name] Anes 11/15/2019 Medical Record [id number] Patient Account Number: [id number] Date of Birth/Sex: Treating RN: April 18, 1951 (69 y.o. 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 exposed E11.51 Type 2 diabetes mellitus with diabetic  peripheral angiopathy without gangrene E11.621 Type 2 diabetes mellitus with foot ulcer L97.521 Non-pressure chronic ulcer of other part of left foot limited to breakdown of skin M14.672 Charcot's joint, left ankle and foot E11.40 Type 2 diabetes mellitus with diabetic neuropathy, unspecified Facility Procedures CPT4 Code Description: [id number] - APPLY TOTAL CONTACT LEG CAST ICD-10 Diagnosis Description L97.522 Non-pressure chronic ulcer of other part of left foot with Modifier: fat layer exp Quantity: 1 osed Physician Procedures Electronic Signature(s) Signed: 11/15/2019 3:11:21 PM By: Worthy Keeler PA-C Entered By: Worthy Keeler on 11/15/2019 15:11:21

## 2019-11-18 ENCOUNTER — Other Ambulatory Visit: Payer: Self-pay

## 2019-11-20 ENCOUNTER — Ambulatory Visit (INDEPENDENT_AMBULATORY_CARE_PROVIDER_SITE_OTHER): Payer: Medicare Other | Admitting: Internal Medicine

## 2019-11-20 ENCOUNTER — Other Ambulatory Visit: Payer: Self-pay

## 2019-11-20 ENCOUNTER — Encounter: Payer: Self-pay | Admitting: Internal Medicine

## 2019-11-20 VITALS — BP 138/78 | HR 69 | Wt 169.6 lb

## 2019-11-20 DIAGNOSIS — E11319 Type 2 diabetes mellitus with unspecified diabetic retinopathy without macular edema: Secondary | ICD-10-CM

## 2019-11-20 DIAGNOSIS — E1165 Type 2 diabetes mellitus with hyperglycemia: Secondary | ICD-10-CM | POA: Diagnosis not present

## 2019-11-20 DIAGNOSIS — IMO0002 Reserved for concepts with insufficient information to code with codable children: Secondary | ICD-10-CM

## 2019-11-20 LAB — POCT GLYCOSYLATED HEMOGLOBIN (HGB A1C): HbA1c POC (<> result, manual entry): >15 % (ref 4.0–5.6)

## 2019-11-20 NOTE — Patient Instructions (Addendum)
Please restart: - 70/30 (ReliOn) insulin 16-20 units before breakfast and 16-20 units before dinner  Try to check sugars 2x a day.  Please return in 3 months with your sugar log.

## 2019-11-20 NOTE — Progress Notes (Signed)
Patient ID: [name], male   DOB: 18-Dec-1950, 69 y.o.   MRN: [id number]   This visit occurred during the SARS-CoV-2 public health emergency.  Safety protocols were in place, including screening questions prior to the visit, additional usage of staff PPE, and extensive cleaning of exam room while observing appropriate contact time as indicated for disinfecting solutions.   HPI: [name] is a 69 y.o.-year-old male, initially referred by his PCP, Dr. Pamella Pert, returning for follow-up DM2, dx in 1990s, insulin-dependent since 2000, very poorly controlled, with many complications (CKD, diabetic retinopathy, peripheral neuropathy-Charcot foot, diabetic ulcer, erectile dysfunction).  Last visit 4 months ago.  He was off insulin completely before last visit as he was homeless.  Before last visit he got back to his home.  I advised him that he cannot pay for his insulin to let me know so I can help him.  He is now off his insulin completely again.  He did not call me to let me know about running out of insulin  Reviewed his HbA1c levels: Lab Results  Component Value Date   HGBA1C  11/20/2019     Comment:     >15   HGBA1C >15 11/20/2019   HGBA1C 13.0 (A) 08/19/2019   HGBA1C 10.3 (H) 05/06/2019   HGBA1C 11.7 (A) 04/19/2019   HGBA1C 12.0 (A) 02/07/2019   HGBA1C 13.9 (H) 10/19/2018   HGBA1C 11.9 (H) 07/18/2018   HGBA1C 16.0 (H) 09/13/2014   HGBA1C 16.3 (H) 08/22/2012   He is on: - Humulin 70/30 (pens) 16 to 20 units before breakfast and dinner >> now off He is not working >> very limited resources.  At last visit, he was telling me that she was stretching his insulin and not take it daily.  He is not checking blood sugars -did not pick up the glucometer prescription resent in his pharmacy. - am: n/c - 2h after b'fast: n/c - before lunch: n/c - 2h after lunch: n/c - before dinner: n/c - 2h after dinner: n/c - bedtime: n/c - nighttime: n/c Lowest sugar was 70 >> ?; he has  hypoglycemia awareness in the 70s.  Highest sugar was ?.  Glucometer: ReliOn  Pt's meals are: - Breakfast: bran cereal + almond milk - vanilla - Lunch:  fruit - Dinner: fish, veggies and beans - Snacks: no snacks except popcorn He drinks diet drinks.  -+ CKD, last BUN/creatinine:  Lab Results  Component Value Date   BUN 25 (H) 06/29/2019   BUN 21 06/28/2019   CREATININE 2.06 (H) 06/29/2019   CREATININE 1.85 (H) 06/28/2019  He was on losartan 25 but stopped before last visit  -+ HL; last set of lipids: Lab Results  Component Value Date   CHOL 136 05/06/2019   HDL 53 05/06/2019   LDLCALC 74 05/06/2019   TRIG 47 05/06/2019   CHOLHDL 2.6 05/06/2019  On Lipitor 20.   - last eye exam was in 11/2019: normal, previously: + DR  -No numbness and tingling in his feet.  He does have history of peripheral neuropathy with left Charcot foot.  On Neurontin.  He sees Dr. [name] and Dr. Adah Perl.  Pt has FH of DM in sisters.  He also has a history of HTN, sarcoidosis, complete heart block, SBO, adrenal insufficiency -however, this is resolved, currently not on steroids.  Cortisol level obtained at 9 AM was normal: Component     Latest Ref Rng & Units 05/07/2019  Cortisol, Plasma     ug/dL  17.7    ROS: Constitutional: no weight gain/no weight loss, no fatigue, no subjective hyperthermia, no subjective hypothermia Eyes: no blurry vision, no xerophthalmia ENT: no sore throat, no nodules palpated in neck, no dysphagia, no odynophagia, no hoarseness Cardiovascular: no CP/+ exertional SOB/no palpitations/no leg swelling Respiratory: no cough/+ exertional SOB/no wheezing Gastrointestinal: no N/no V/no D/no C/no acid reflux Musculoskeletal: no muscle aches/no joint aches Skin: no rashes, no hair loss Neurological: no tremors/no numbness/no tingling/no dizziness  I reviewed pt's medications, allergies, PMH, social hx, family hx, and changes were documented in the history of present  illness. Otherwise, unchanged from my initial visit note.  Past Medical History:  Diagnosis Date  . Cellulitis and abscess of face 10/11/2007   Qualifier: Diagnosis of  By: Amil Amen MD, Benjamine Mola    . Diabetic retinopathy ([place])   . Diabetic retinopathy associated with type 2 diabetes mellitus ([place]) 09/13/2014   He works for [employer]  . Fluid overload 09/03/2012   Post op  . GERD (gastroesophageal reflux disease)   . Visual impairment    Past Surgical History:  Procedure Laterality Date  . ABDOMINAL AORTOGRAM W/LOWER EXTREMITY Left 05/30/2019   Procedure: ABDOMINAL AORTOGRAM W/LOWER EXTREMITY;  Surgeon: Marty Heck, MD;  Location: [facility];  Service: Cardiovascular;  Laterality: Left;  . CATARACT EXTRACTION W/ INTRAOCULAR LENS  IMPLANT, BILATERAL    . EYE SURGERY Bilateral    "laser OR for diabetic retinopathy"  . INGUINAL HERNIA REPAIR     [name] 07/12/2010), "don't remember which side"  . LAPAROTOMY  08/23/2012   Procedure: EXPLORATORY LAPAROTOMY;  Surgeon: [name], DO;  Location: [facility];  Service: General;  Laterality: N/A;  exploratory laparotomy with lysis of adhesions  . LYSIS OF ADHESION  08/23/2012   Procedure: LYSIS OF ADHESION;  Surgeon: [name], DO;  Location: [facility];  Service: General;;  . PERIPHERAL VASCULAR BALLOON ANGIOPLASTY  05/30/2019   Procedure: PERIPHERAL VASCULAR BALLOON ANGIOPLASTY;  Surgeon: Marty Heck, MD;  Location: [facility];  Service: Cardiovascular;;  left anterior tibial  . ROBOT ASSISTED LAPAROSCOPIC RADICAL PROSTATECTOMY  2000's   "had to finish manually after machine broke"  . SHOULDER SURGERY  1970's   separation; from playing football"   Social History   Socioeconomic History  . Marital status: Married    Spouse name: Not on file  . Number of children: 1  . Years of education: Not on file  . Highest education level: Not on file  Occupational History  .  Glass blower/designer  Social Needs   . Financial resource strain: Not on file  . Food insecurity    Worry: Not on file    Inability: Not on file  . Transportation needs    Medical: Not on file    Non-medical: Not on file  Tobacco Use  . Smoking status: Former Smoker    Packs/day: 2.50    Years: 3.00    Pack years: 7.50    Types: Cigarettes    Quit date: 11/09/1966    Years since quitting: 52.5  . Smokeless tobacco: Never Used  Substance and Sexual Activity  . Alcohol use: Yes    Comment: 04/08/2015 "maybe a beer/ or 2 or a glass of wine monthly"  . Drug use: No   Current Outpatient Medications on File Prior to Visit  Medication Sig Dispense Refill  . acetaminophen (TYLENOL) 650 MG CR tablet Take 650 mg by mouth every 8 (eight) hours as needed  for pain.    [name] amLODipine (NORVASC) 10 MG tablet Take 1 tablet (10 mg total) by mouth daily. 90 tablet 3  . aspirin 81 MG chewable tablet Chew 1 tablet (81 mg total) by mouth every morning. 90 tablet 3  . atorvastatin (LIPITOR) 20 MG tablet Take 1 tablet (20 mg total) by mouth every morning. 90 tablet 3  . blood glucose meter kit and supplies KIT Dispense based on patient and insurance preference. Use up to four times daily as directed. 1 each 1  . Blood Glucose Monitoring Suppl (RELION CONFIRM GLUCOSE MONITOR) w/Device KIT Use to check blood sugar 3 times a day. 1 kit 0  . clopidogrel (PLAVIX) 75 MG tablet Take 1 tablet (75 mg total) by mouth daily with breakfast. 30 tablet 3  . docusate sodium (COLACE) 100 MG capsule Take 100 mg by mouth daily as needed for mild constipation.    . gabapentin (NEURONTIN) 300 MG capsule TAKE 1 CAPSULE(300 MG) BY MOUTH THREE TIMES DAILY (Patient taking differently: Take 300 mg by mouth 2 (two) times daily. Take 300 mg by mouth in the morning and 300 mg at bedtime- may take an additional 300 mg once a day as needed/as directed) 90 capsule 0  . Insulin Isophane & Regular Human (HUMULIN 70/30 MIX) (70-30) 100 UNIT/ML PEN Inject 16-20 Units into the skin  daily before breakfast AND 16-20 Units daily before supper. 15 mL   . Insulin Syringe-Needle U-100 (BD INSULIN SYRINGE ULTRAFINE) 31G X 5/16" 0.5 ML MISC Use 2x a day for insulin 100 each 5  . Lancets (FREESTYLE) lancets Use as instructed 100 each 0  . Lancets 30G MISC     . Multiple Vitamins-Minerals (CENTRUM SILVER 50+MEN) TABS Take 1 tablet by mouth daily with breakfast.    . Polyethyl Glycol-Propyl Glycol (SYSTANE OP) Place 1 drop into both eyes as needed (for dryness).     . torsemide (DEMADEX) 100 MG tablet Take 0.5 tablets (50 mg total) by mouth daily. 60 tablet 2  . Wound Dressings (MEDIHONEY WOUND/BURN DRESSING) GEL Apply to affected are 3 times a week, and cover with sterile dressing. 30 mL 1  . sulfamethoxazole-trimethoprim (BACTRIM) 400-80 MG tablet Take 1 tablet by mouth 2 (two) times daily. (Patient not taking: Reported on 11/20/2019) 28 tablet 0   No current facility-administered medications on file prior to visit.   Allergies  Allergen Reactions  . Ace Inhibitors Itching and Cough  . Naproxen Anaphylaxis, Shortness Of Breath and Other (See Comments)    Throat swells. cannot breathe, and causes GI distress   Family History  Problem Relation Age of Onset  . Diabetes Mellitus II Mother   . Colon cancer Neg Hx     PE: BP 138/78 (BP Location: Left Arm, Patient Position: Sitting, Cuff Size: Normal)   Pulse 69   Wt 169 lb 9.6 oz (76.9 kg)   SpO2 98%   BMI 26.56 kg/m  Wt Readings from Last 3 Encounters:  11/20/19 169 lb 9.6 oz (76.9 kg)  09/25/19 172 lb 3.2 oz (78.1 kg)  08/19/19 167 lb (75.8 kg)   Constitutional: overweight, in NAD Eyes: PERRLA, EOMI, no exophthalmos ENT: moist mucous membranes, no thyromegaly, no cervical lymphadenopathy Cardiovascular: RRR, No MRG, + B LE edema, L foot in boot Respiratory: CTA B Gastrointestinal: abdomen soft, NT, ND, BS+ Musculoskeletal: no deformities, strength intact in all 4 Skin: moist, warm, no rashes Neurological: no tremor  with outstretched hands, DTR normal in all 4  ASSESSMENT: 1.  DM2, insulin-dependent, uncontrolled, with complications - CKD stage 3 - DR - L foot diabetic ulcer - PN with Charcot foot - ED  2. HL  3.  Financial difficulties  PLAN:  1. Patient with longstanding, very uncontrolled diabetes, noncompliant with his regimen of premixed insulin due to many factors, including finances.  He was off insulin at last visit and only restarted it right before last visit.  I advised him not to run out again, but let me know if he is in danger of running out so I can try to help him.  She did not do so.  Also, we discussed about getting ReliOn insulin from [place], which is without prescription and the cheapest insulin available.  He did not get this.  He is now off insulin for weeks.  He is also not checking his sugars. -Unfortunately, if he is not taking his insulin and not checking sugars I cannot help him. -For now, he tells me that he put some money a size of a cane get his insulin and also Lipitor (which he did not start yet).  However, we discussed that he can get financial assistance from pharmaceutical companies but for now, I placed a referral to [facility] to get more help with his medicines. -We will restart the 70/30 insulin at the previous doses for now -I advised him to get a new meter from the pharmacy and check sugars twice a day - I suggested to:  Patient Instructions  Please restart: - 70/30 (ReliOn) insulin 16-20 units before breakfast and 16-20 units before dinner  Try to check sugars 2x a day.  Please return in 3 months with your sugar log.   - we checked his HbA1c: >15% (undetectable high) - advised to check sugars at different times of the day - 2x a day, rotating check times - advised for yearly eye exams >> he is UTD - return to clinic in 3 months  2. HL -Reviewed latest lipid panel from 04/2019: All fractions at goal Lab Results  Component Value Date   CHOL 136 05/06/2019    HDL 53 05/06/2019   LDLCALC 74 05/06/2019   TRIG 47 05/06/2019   CHOLHDL 2.6 05/06/2019  -Previously on Lipitor 20 without side effects but ran out before last visit.  I have refilled it then.  He did not start it yet but plans to do so today  3.  Financial difficulties -Running out of insulin frequently. -HbA1c >15% as a consequence - Please see problem #1 - referred to [name].  -he will be out of town in few days as his mother died and he will go to the funeral.  Philemon Kingdom, MD PhD [facility] Endocrinology

## 2019-11-20 NOTE — Progress Notes (Signed)
[name], [name] ([id number]) Visit Report for 11/15/2019 Arrival Information Details Patient Name: Date of Service: [name], [name] 11/15/2019 1:00 PM Medical Record [id number] Patient Account Number: [id number] Date of Birth/Sex: Treating RN: 02-14-1951 (69 y.o. [name] Primary Care Tylicia Sherman: [name] Other Clinician: Referring Erinn Huskins: Treating Chrystian Cupples/Extender:Stone III, Jeanette Caprice, Irma Weeks in Treatment: 4 Visit Information History Since Last Visit Added or deleted any medications: No Patient Arrived: Ambulatory Any new allergies or adverse reactions: No Arrival Time: 13:50 Had a fall or experienced change in No Accompanied By: self activities of daily living that may affect Transfer Assistance: None risk of falls: Patient Identification Verified: Yes Signs or symptoms of abuse/neglect No Secondary Verification Process Yes since last visito Completed: Hospitalized since last visit: No Patient Requires Transmission- No Implantable device outside of the clinic No Based Precautions: excluding Patient Has Alerts: Yes cellular tissue based products placed in Patient Alerts: Patient on Blood the center Thinner since last visit: Has Dressing in Place as Prescribed: Yes Has Footwear/Offloading in Place as Yes Prescribed: Left: Total Contact Cast Pain Present Now: No Electronic Signature(s) Signed: 11/19/2019 6:21:28 PM By: Baruch Gouty RN, BSN Entered By: Baruch Gouty on 11/15/2019 13:51:34 -------------------------------------------------------------------------------- Encounter Discharge Information Details Patient Name: Date of Service: [name] 11/15/2019 1:00 PM Medical Record [id number] Patient Account Number: [id number] Date of Birth/Sex: Treating RN: [birth year] (69 y.o. [name] Primary Care Milagro Belmares: [name] Other Clinician: Referring Myeisha Kruser: Treating Wilberth Damon/Extender:Stone III,  Jeanette Caprice, Irma Weeks in Treatment: 4 Encounter Discharge Information Items Discharge Condition: Stable Ambulatory Status: Ambulatory Discharge Destination: Home Transportation: Private Auto Accompanied By: self Schedule Follow-up Appointment: Yes Clinical Summary of Care: Electronic Signature(s) Signed: 11/15/2019 4:58:22 PM By: Deon Pilling Entered By: Deon Pilling on 11/15/2019 14:56:59 -------------------------------------------------------------------------------- Lower Extremity Assessment Details Patient Name: Date of Service: [name] 11/15/2019 1:00 PM Medical Record [id number] Patient Account Number: [id number] Date of Birth/Sex: Treating RN: Nov 20, 1950 (69 y.o. [name] Primary Care Kamoni Depree: [name] Other Clinician: Referring Fatmata Legere: Treating Ysidra Sopher/Extender:Stone III, Jeanette Caprice, Irma Weeks in Treatment: 4 Edema Assessment Assessed: [Left: No] [Right: No] Edema: [Left: N] [Right: o] Calf Left: Right: Point of Measurement: 41 cm From Medial Instep 33.5 cm cm Ankle Left: Right: Point of Measurement: 9 cm From Medial Instep 22 cm cm Vascular Assessment Pulses: Dorsalis Pedis Palpable: [Left:No] Electronic Signature(s) Signed: 11/19/2019 6:21:28 PM By: Baruch Gouty RN, BSN Entered By: Baruch Gouty on 11/15/2019 14:04:19 -------------------------------------------------------------------------------- Multi-Disciplinary Care Plan Details Patient Name: Date of Service: [name] 11/15/2019 1:00 PM Medical Record [id number] Patient Account Number: [id number] Date of Birth/Sex: Treating RN: 02-Jan-1951 (69 y.o. [name] Primary Care [name]: [name] Other Clinician: Referring Decorian Schuenemann: Treating Remi Rester/Extender:Stone III, Jeanette Caprice, Irma Weeks in Treatment: 4 Active Inactive Nutrition Nursing Diagnoses: Impaired glucose control: actual or potential Potential for alteratiion in  Nutrition/Potential for imbalanced nutrition Goals: Patient/caregiver will maintain therapeutic glucose control Date Initiated: 10/18/2019 Target Resolution Date: 12/13/2019 Goal Status: Active Interventions: Assess HgA1c results as ordered upon admission and as needed Assess patient nutrition upon admission and as needed per policy Provide education on elevated blood sugars and impact on wound healing Treatment Activities: Patient referred to Primary Care Physician for further nutritional evaluation : 10/18/2019 Notes: Wound/Skin Impairment Nursing Diagnoses: Impaired tissue integrity Knowledge deficit related to ulceration/compromised skin integrity Goals: Patient/caregiver will verbalize understanding of skin care regimen Date Initiated: 10/18/2019 Target Resolution Date: 12/13/2019 Goal Status: Active Ulcer/skin breakdown will have a volume reduction of 30%  by week 4 Date Initiated: 10/18/2019 Target Resolution Date: 12/13/2019 Goal Status: Active Interventions: Assess patient/caregiver ability to obtain necessary supplies Assess patient/caregiver ability to perform ulcer/skin care regimen upon admission and as needed Assess ulceration(s) every visit Provide education on ulcer and skin care Treatment Activities: Skin care regimen initiated : 10/18/2019 Topical wound management initiated : 10/18/2019 Notes: Electronic Signature(s) Signed: 11/18/2019 4:55:48 PM By: Kela Millin Entered By: Kela Millin on 11/15/2019 14:33:04 -------------------------------------------------------------------------------- Pain Assessment Details Patient Name: Date of Service: [name] 11/15/2019 1:00 PM Medical Record [id number] Patient Account Number: [id number] Date of Birth/Sex: Treating RN: Nov 16, 1950 (70 y.o. [name] Primary Care Caroll Cunnington: [name] Other Clinician: Referring Kerrianne Jeng: Treating Dolton Shaker/Extender:Stone III, Jeanette Caprice, Irma Weeks in  Treatment: 4 Active Problems Location of Pain Severity and Description of Pain Patient Has Paino Yes Site Locations Pain Location: Pain in Ulcers With Dressing Change: Yes Duration of the Pain. Constant / Intermittento Intermittent Rate the pain. Current Pain Level: 0 Worst Pain Level: 10 Least Pain Level: 0 Character of Pain Describe the Pain: Sharp, Tender Pain Management and Medication Current Pain Management: Other: time Is the Current Pain Management Adequate: Adequate How does your wound impact your activities of daily livingo Sleep: Yes Bathing: No Appetite: No Relationship With Others: No Bladder Continence: No Emotions: No Bowel Continence: No Drive: No Toileting: No Hobbies: No Dressing: No Electronic Signature(s) Signed: 11/19/2019 6:21:28 PM By: Baruch Gouty RN, BSN Entered By: Baruch Gouty on 11/15/2019 14:04:11 -------------------------------------------------------------------------------- Patient/Caregiver Education Details Patient Name: Date of Service: Bertholf, Kainen L. 4/2/2021andnbsp1:00 PM Medical Record [id number] Patient Account Number: [id number] Date of Birth/Gender: Treating RN: 05/16/1951 (69 y.o. [name] Primary Care Physician: [name] Other Clinician: Referring Physician: Treating Physician/Extender:Stone III, Jeanette Caprice, Clelia Schaumann in Treatment: 4 Education Assessment Education Provided To: Patient Education Topics Provided Elevated Blood Sugar/ Impact on Healing: Methods: Explain/Verbal Responses: State content correctly Wound/Skin Impairment: Methods: Explain/Verbal Responses: State content correctly Electronic Signature(s) Signed: 11/18/2019 4:55:48 PM By: Kela Millin Entered By: Kela Millin on 11/15/2019 14:33:21 -------------------------------------------------------------------------------- Wound Assessment Details Patient Name: Date of Service: ANTWIAN, SANTAANA 11/15/2019  1:00 PM Medical Record [id number] Patient Account Number: [id number] Date of Birth/Sex: Treating RN: 03-29-1951 (69 y.o. [name] Primary Care Johnhenry Tippin: [name] Other Clinician: Referring Camreigh Michie: Treating Vrinda Heckstall/Extender:Stone III, Jeanette Caprice, Irma Weeks in Treatment: 4 Wound Status Wound Number: 1 Primary Diabetic Wound/Ulcer of the Lower Extremity Etiology: Wound Location: Left, Lateral Foot Wound Open Wounding Event: Gradually Appeared Status: Date Acquired: 08/15/2018 Comorbid Congestive Heart Failure, Hypertension, Weeks Of Treatment: 4 History: Peripheral Venous Disease, Type II Diabetes Clustered Wound: No Wound Measurements Length: (cm) 1.1 Width: (cm) 1.3 Depth: (cm) 0.2 Area: (cm) 1.123 Volume: (cm) 0.225 Wound Description Classification: Grade 2 Wound Margin: Flat and Intact Exudate Amount: Medium Exudate Type: Serosanguineous Exudate Color: red, brown Wound Bed Granulation Amount: Large (67-100%) Granulation Quality: Red Necrotic Amount: Small (1-33%) Necrotic Quality: Adherent Slough fter Cleansing: No ino Yes Exposed Structure sed: No Subcutaneous Tissue) Exposed: Yes sed: No sed: No ed: No d: No % Reduction in Area: 60.3% % Reduction in Volume: 84.1% Epithelialization: Small (1-33%) Tunneling: No Undermining: No Foul Odor A Slough/Fibr Fascia Expo Fat Layer ( Tendon Expo Muscle Expo Joint Expos Bone Expose Treatment Notes Wound #1 (Left, Lateral Foot) 1. Cleanse With Wound Cleanser Soap and water 2. Periwound Care Moisturizing lotion 3. Primary Dressing Applied Calcium Alginate Ag 4. Secondary Dressing Dry Gauze Roll Gauze Foam 5. Secured With Medipore tape  7. Footwear/Offloading device applied Total Contact Cast Notes foam donut. TCC applied by PA. Electronic Signature(s) Signed: 11/19/2019 6:21:28 PM By: Baruch Gouty RN, BSN Entered By: Baruch Gouty on 11/15/2019  14:07:35 -------------------------------------------------------------------------------- [place] Details Patient Name: Date of Service: [name] 11/15/2019 1:00 PM Medical Record [id number] Patient Account Number: [id number] Date of Birth/Sex: Treating RN: 28-Jun-1951 (69 y.o. [name] Primary Care Adewale Pucillo: [name] Other Clinician: Referring Xyon Lukasik: Treating Maekayla Giorgio/Extender:Stone III, Jeanette Caprice, Irma Weeks in Treatment: 4 Vital Signs Time Taken: 13:51 Temperature (F): 98.4 Height (in): 67 Pulse (bpm): 70 Source: Stated Respiratory Rate (breaths/min): 18 Weight (lbs): 173 Blood Pressure (mmHg): 180/70 Source: Stated Reference Range: 80 - 120 mg / dl Body Mass Index (BMI): 27.1 Electronic Signature(s) Signed: 11/19/2019 6:21:28 PM By: Baruch Gouty RN, BSN Entered By: Baruch Gouty on 11/15/2019 13:52:21

## 2019-11-21 ENCOUNTER — Telehealth: Payer: Self-pay | Admitting: *Deleted

## 2019-11-21 NOTE — Telephone Encounter (Signed)
Spoke to pt told him to contact [facility] and establish care with them so you can get your medications per Dr.Gherghe. Phone number given to pt [phone]. Pt verbalized understanding.

## 2019-11-21 NOTE — Telephone Encounter (Signed)
-----   Message from Philemon Kingdom, MD sent at 11/21/2019 11:02 AM EDT ----- Butch Penny, Here is the patient that I was talking to you about.  Can you please give him the information for the [facility].  He needs to call and establish care with them to be able to obtain his medicines. Ty, C

## 2019-11-22 ENCOUNTER — Encounter (HOSPITAL_BASED_OUTPATIENT_CLINIC_OR_DEPARTMENT_OTHER): Payer: Medicare Other | Admitting: Internal Medicine

## 2019-11-22 ENCOUNTER — Other Ambulatory Visit: Payer: Self-pay

## 2019-11-22 DIAGNOSIS — E11621 Type 2 diabetes mellitus with foot ulcer: Secondary | ICD-10-CM | POA: Diagnosis not present

## 2019-11-25 NOTE — Progress Notes (Signed)
[name], [name] ([id number]) Visit Report for 11/22/2019 HPI Details Patient Name: Date of Service: [name], [name] 11/22/2019 3:45 PM Medical Record [id number] Patient Account Number: [id number] Date of Birth/Sex: Treating RN: 30-Apr-1951 (69 y.o. [name] Primary Care Provider: [name] Other Clinician: Referring Provider: Treating Provider/Extender:Curlie Macken, Wayland Denis, Clelia Schaumann in Treatment: 5 History of Present Illness HPI Description: ADMISSION 10/18/2019 Patient is a 69 year old man with type 2 diabetes peripheral neuropathy and a Charcot foot. The problem I think began in March 2020 just about a year ago when he was admitted to hospital from 10/19/2018 through 10/24/2018 with a wound on his left lateral foot intense cellulitis. MRI did not suggest osteomyelitis but did note a draining sinus. He apparently received a prolonged course of antibiotics. His hemoglobin A1c at that time was 13.9. He was referred to Dr. [name] where he appears to been followed for probably 5 or 6 months. He was subsequently referred himself to Dr. [name] of podiatry. He has been offloaded in a surgical shoe with a Pegasys sole. He has been mostly using Medihoney and episodic debridement of intense surrounding callus. He has been referred here for further management of this difficult the heel wound area and consideration of hyperbaric oxygen The patient had an angiogram in November. He did not have any proximal stenoses but he did have a 70 to 80% peroneal artery occlusion as well as an 80 to 95% stenosis of the anterior tibia which underwent an angioplasty. Follow-up ABIs were done on 07/02/2019 showed an ABI on the right of 1.26 on the left at 1.28 with TBI's of 0.65 and 0.67 respectively. Waveforms were triphasic Past medical history includes type 2 diabetes with neuropathy, PAD and retinopathy, prostate cancer, gastroesophageal reflux disease, chronic kidney disease, small bowel  obstruction 10/25/2019. Patient's x-ray of the left foot from last time showed no evidence of osteomyelitis. Chronic Charcot/neuropathic changes as expected. Notable soft tissue ulcer involving the plantar aspect underlying the forefoot. The patient actually has 2 wounds. One on the mid forefoot limited to breakdown of skin. The larger area is actually on the lateral foot. We have been using silver alginate to both wound areas. 3/15; patient came back for the obligatory first total contact cast change. He was apparently walking on the cast without the boot but otherwise tolerated it well and per our intake nurse the wounds look satisfactory. He will be back later in the week for his wound care visit 3/18; patient's wound on the plantar foot looks smaller of lateral foot about the same. Both required debridement. There was some macerated tissue between the 2 wounds which were also removed. We have been using silver alginate. 3/26 1 week follow-up. Total contact cast. Plantar foot wound has closed over. The area on the lateral part of his foot is still open but smaller. Using silver alginate on the wounds 11/15/2019 upon evaluation today patient appears to be doing well with regard to his wound. He is making progress and fortunately the wound is measuring smaller and looking better at this point. Fortunately there is no signs of active infection which is also good news we been using a total contact cast up to this point. 4/9; small wound now on the left lateral foot not technically on the plantar surface although I think he may be somebody who walks partially on the outside of his foot. The more extensive wound on the plantar foot healed 2 weeks ago. The patient tells me that his mother is recently  passed away. He wants the total contact cast off Week in preparation for a funeral in [place]. We'll bring him back in on Wednesday to try and accommodate this. We also changed the Hydrofera Blue  might give [place] a quick chance to have a look at this on that date Electronic Signature(s) Signed: 11/25/2019 9:07:11 AM By: Linton Ham MD Entered By: Linton Ham on 11/24/2019 08:50:10 -------------------------------------------------------------------------------- Physical Exam Details Patient Name: Date of Service: [name] 11/22/2019 3:45 PM Medical Record [id number] Patient Account Number: [id number] Date of Birth/Sex: Treating RN: 1950/09/02 (69 y.o. [name] Primary Care Provider: [name] Other Clinician: Referring Provider: Treating Provider/Extender:Maysun Meditz, Wayland Denis, Clelia Schaumann in Treatment: 5 Constitutional Patient is hypertensive.. Pulse regular and within target range for patient.[name] Respirations regular, non-labored and within target range.. Temperature is normal and within the target range for the patient.[name] Appears in no distress. Notes wound exam; the patient's wound actually looks quite healthy even under direct illumination. No debridement was felt to be necessary. There is no evidence of infection. The wound that was on the plantar part of his Charcot foot has continued to be closed Electronic Signature(s) Signed: 11/25/2019 9:07:11 AM By: Linton Ham MD Entered By: Linton Ham on 11/24/2019 08:51:13 -------------------------------------------------------------------------------- Physician Orders Details Patient Name: Date of Service: [name] 11/22/2019 3:45 PM Medical Record [id number] Patient Account Number: [id number] Date of Birth/Sex: Treating RN: 01-28-1951 (69 y.o. [name] Primary Care Provider: [name] Other Clinician: Referring Provider: Treating Provider/Extender:Marvelous Woolford, Wayland Denis, Clelia Schaumann in Treatment: 5 Verbal / Phone Orders: No Diagnosis Coding ICD-10 Coding Code Description [phone] Non-pressure chronic ulcer of other part of left foot with fat  layer exposed E11.51 Type 2 diabetes mellitus with diabetic peripheral angiopathy without gangrene E11.621 Type 2 diabetes mellitus with foot ulcer L97.521 Non-pressure chronic ulcer of other part of left foot limited to breakdown of skin M14.672 Charcot's joint, left ankle and foot E11.40 Type 2 diabetes mellitus with diabetic neuropathy, unspecified Follow-up Appointments Return Appointment in 1 week. - Friday Dressing Change Frequency Wound #1 Left,Lateral Foot Do not change entire dressing for one week. Skin Barriers/Peri-Wound Care Barrier cream - to macerated periwound area. Wound Cleansing Wound #1 Left,Lateral Foot May shower with protection. - cast protector Primary Wound Dressing Wound #1 Left,Lateral Foot Hydrofera Blue Secondary Dressing Wound #1 Left,Lateral Foot Foam - donut Dry Gauze - tape Other: - cushion area to plantar foot Off-Loading Total Contact Cast to Left [facility] for wound care. - [facility], HOLD for wound care due to cast placed on. Electronic Signature(s) Signed: 11/22/2019 6:04:52 PM By: Kela Millin Signed: 11/25/2019 9:07:11 AM By: Linton Ham MD Entered By: Kela Millin on 11/22/2019 17:15:10 -------------------------------------------------------------------------------- Problem List Details Patient Name: Date of Service: [name], [name] 11/22/2019 3:45 PM Medical Record [id number] Patient Account Number: [id number] Date of Birth/Sex: Treating RN: [birth year] (69 y.o. [name] Primary Care Provider: [name] Other Clinician: Referring Provider: Treating Provider/Extender:Ki Corbo, Wayland Denis, Clelia Schaumann in Treatment: 5 Active Problems ICD-10 Evaluated Encounter Code Description Active Date Today Diagnosis L97.522 Non-pressure chronic ulcer of other part of left foot 10/18/2019 No Yes with fat layer exposed E11.51 Type 2 diabetes mellitus  with diabetic peripheral 10/18/2019 No Yes angiopathy without gangrene E11.621 Type 2 diabetes mellitus with foot ulcer 10/18/2019 No Yes L97.521 Non-pressure chronic ulcer of other part of left foot 10/25/2019 No Yes limited to breakdown of skin M14.672 Charcot's joint, left ankle and  foot 10/18/2019 No Yes E11.40 Type 2 diabetes mellitus with diabetic neuropathy, 10/18/2019 No Yes unspecified Inactive Problems Resolved Problems Electronic Signature(s) Signed: 11/22/2019 6:04:52 PM By: Kela Millin Signed: 11/25/2019 9:07:11 AM By: Linton Ham MD Entered By: Kela Millin on 11/22/2019 17:13:21 -------------------------------------------------------------------------------- Progress Note Details Patient Name: Date of Service: [name] 11/22/2019 3:45 PM Medical Record [id number] Patient Account Number: [id number] Date of Birth/Sex: Treating RN: Oct 31, 1950 (69 y.o. [name] Primary Care Provider: [name] Other Clinician: Referring Provider: Treating Provider/Extender:Joetta Delprado, Wayland Denis, Clelia Schaumann in Treatment: 5 Subjective History of Present Illness (HPI) ADMISSION 10/18/2019 Patient is a 69 year old man with type 2 diabetes peripheral neuropathy and a Charcot foot. The problem I think began in March 2020 just about a year ago when he was admitted to hospital from 10/19/2018 through 10/24/2018 with a wound on his left lateral foot intense cellulitis. MRI did not suggest osteomyelitis but did note a draining sinus. He apparently received a prolonged course of antibiotics. His hemoglobin A1c at that time was 13.9. He was referred to Dr. [name] where he appears to been followed for probably 5 or 6 months. He was subsequently referred himself to Dr. [name] of podiatry. He has been offloaded in a surgical shoe with a Pegasys sole. He has been mostly using Medihoney and episodic debridement of intense surrounding callus. He has been referred here for  further management of this difficult the heel wound area and consideration of hyperbaric oxygen The patient had an angiogram in November. He did not have any proximal stenoses but he did have a 70 to 80% peroneal artery occlusion as well as an 80 to 95% stenosis of the anterior tibia which underwent an angioplasty. Follow-up ABIs were done on 07/02/2019 showed an ABI on the right of 1.26 on the left at 1.28 with TBI's of 0.65 and 0.67 respectively. Waveforms were triphasic Past medical history includes type 2 diabetes with neuropathy, PAD and retinopathy, prostate cancer, gastroesophageal reflux disease, chronic kidney disease, small bowel obstruction 10/25/2019. Patient's x-ray of the left foot from last time showed no evidence of osteomyelitis. Chronic Charcot/neuropathic changes as expected. Notable soft tissue ulcer involving the plantar aspect underlying the forefoot. The patient actually has 2 wounds. One on the mid forefoot limited to breakdown of skin. The larger area is actually on the lateral foot. We have been using silver alginate to both wound areas. 3/15; patient came back for the obligatory first total contact cast change. He was apparently walking on the cast without the boot but otherwise tolerated it well and per our intake nurse the wounds look satisfactory. He will be back later in the week for his wound care visit 3/18; patient's wound on the plantar foot looks smaller of lateral foot about the same. Both required debridement. There was some macerated tissue between the 2 wounds which were also removed. We have been using silver alginate. 3/26 1 week follow-up. Total contact cast. Plantar foot wound has closed over. The area on the lateral part of his foot is still open but smaller. Using silver alginate on the wounds 11/15/2019 upon evaluation today patient appears to be doing well with regard to his wound. He is making progress and fortunately the wound is measuring smaller  and looking better at this point. Fortunately there is no signs of active infection which is also good news we been using a total contact cast up to this point. 4/9; small wound now on the left lateral foot not technically on the  plantar surface although I think he may be somebody who walks partially on the outside of his foot. The more extensive wound on the plantar foot healed 2 weeks ago. The patient tells me that his mother is recently passed away. He wants the total contact cast off Week in preparation for a funeral in [place]. We'll bring him back in on Wednesday to try and accommodate this. We also changed the Hydrofera Blue might give [place] a quick chance to have a look at this on that date Objective Constitutional Patient is hypertensive.. Pulse regular and within target range for patient.[name] Respirations regular, non-labored and within target range.. Temperature is normal and within the target range for the patient.[name] Appears in no distress. Vitals Time Taken: 4:35 PM, Height: 67 in, Weight: 173 lbs, BMI: 27.1, Temperature: 98.3 F, Pulse: 65 bpm, Respiratory Rate: 18 breaths/min, Blood Pressure: 148/58 mmHg. General Notes: wound exam; the patient's wound actually looks quite healthy even under direct illumination. No debridement was felt to be necessary. There is no evidence of infection. The wound that was on the plantar part of his Charcot foot has continued to be closed Integumentary (Hair, Skin) Wound #1 status is Open. Original cause of wound was Gradually Appeared. The wound is located on the Left,Lateral Foot. The wound measures 0.9cm length x 1cm width x 0.2cm depth; 0.707cm^2 area and 0.141cm^3 volume. There is Fat Layer (Subcutaneous Tissue) Exposed exposed. There is no tunneling or undermining noted. There is a small amount of serosanguineous drainage noted. The wound margin is flat and intact. There is large (67- 100%) red granulation within the wound bed. There is no  necrotic tissue within the wound bed. Assessment Active Problems ICD-10 Non-pressure chronic ulcer of other part of left foot with fat layer exposed Type 2 diabetes mellitus with diabetic peripheral angiopathy without gangrene Type 2 diabetes mellitus with foot ulcer Non-pressure chronic ulcer of other part of left foot limited to breakdown of skin Charcot's joint, left ankle and foot Type 2 diabetes mellitus with diabetic neuropathy, unspecified Procedures Wound #1 Pre-procedure diagnosis of Wound #1 is a Diabetic Wound/Ulcer of the Lower Extremity located on the Left,Lateral Foot . There was a Total Contact Cast Procedure by Ricard Dillon., MD. Post procedure Diagnosis Wound #1: Same as Pre-Procedure Plan Follow-up Appointments: Return Appointment in 1 week. - Friday Dressing Change Frequency: Wound #1 Left,Lateral Foot: Do not change entire dressing for one week. Skin Barriers/Peri-Wound Care: Barrier cream - to macerated periwound area. Wound Cleansing: Wound #1 Left,Lateral Foot: May shower with protection. - cast protector Primary Wound Dressing: Wound #1 Left,Lateral Foot: Hydrofera Blue Secondary Dressing: Wound #1 Left,Lateral Foot: Foam - donut Dry Gauze - tape Other: - cushion area to plantar foot Off-Loading: Total Contact Cast to Left Lower Extremity Home Health: [facility] for wound care. - [facility], HOLD for wound care due to cast placed on. #1 I think the patient is doing relatively well #2 I continue to total contact cast under the premise that he walks somewhat on the outside of his foot. The wound seems to have responded. #3change the primary dressing Hydrofera Blue. There is no relative depth to the wound current Electronic Signature(s) Signed: 11/25/2019 9:07:11 AM By: Linton Ham MD Entered By: Linton Ham on 11/24/2019  08:52:11 -------------------------------------------------------------------------------- Total Contact Cast Details Patient Name: Date of Service: [name], [name] 11/22/2019 3:45 PM Medical Record [id number] Patient Account Number: [id number] Date of Birth/Sex: Treating RN: 03-31-1951 (69 y.o. Gay Filler, [name]  Primary Care Provider: [name] Other Clinician: Referring Provider: Treating Provider/Extender:Harding Thomure, Wayland Denis, Clelia Schaumann in Treatment: 5 Total Contact Cast Applied for Wound Assessment: Wound #1 Left,Lateral Foot Performed By: Physician Ricard Dillon., MD Post Procedure Diagnosis Same as Pre-procedure Electronic Signature(s) Signed: 11/25/2019 9:07:11 AM By: Linton Ham MD Previous Signature: 11/22/2019 6:04:52 PM Version By: Kela Millin Entered By: Linton Ham on 11/24/2019 08:47:51 -------------------------------------------------------------------------------- SuperBill Details Patient Name: Date of Service: [name] 11/22/2019 Medical Record [id number] Patient Account Number: [id number] Date of Birth/Sex: Treating RN: 1951-03-22 (69 y.o. [name] Primary Care Provider: [name] Other Clinician: Referring Provider: Treating Provider/Extender:Ouita Nish, Wayland Denis, Clelia Schaumann in Treatment: 5 Diagnosis Coding ICD-10 Codes Code Description [phone] Non-pressure chronic ulcer of other part of left foot with fat layer exposed E11.51 Type 2 diabetes mellitus with diabetic peripheral angiopathy without gangrene E11.621 Type 2 diabetes mellitus with foot ulcer L97.521 Non-pressure chronic ulcer of other part of left foot limited to breakdown of skin M14.672 Charcot's joint, left ankle and foot E11.40 Type 2 diabetes mellitus with diabetic neuropathy, unspecified Facility Procedures CPT4 Code Description: [id number] - APPLY TOTAL CONTACT LEG CAST ICD-10 Diagnosis Description L97.522 Non-pressure  chronic ulcer of other part of left foot with Modifier: fat layer ex Quantity: 1 posed Physician Procedures CPT4 Code Description: [id number] - WC PHYS APPLY TOTAL CONTACT CAST ICD-10 Diagnosis Description L97.522 Non-pressure chronic ulcer of other part of left foot with fa Modifier: t layer expo Quantity: 1 sed Electronic Signature(s) Signed: 11/25/2019 9:07:11 AM By: Linton Ham MD Previous Signature: 11/22/2019 6:04:52 PM Version By: Kela Millin Entered By: Linton Ham on 11/24/2019 08:52:56

## 2019-11-26 ENCOUNTER — Ambulatory Visit (HOSPITAL_COMMUNITY): Payer: Medicare Other

## 2019-11-26 ENCOUNTER — Encounter (HOSPITAL_BASED_OUTPATIENT_CLINIC_OR_DEPARTMENT_OTHER): Payer: Medicare Other | Admitting: Internal Medicine

## 2019-11-26 ENCOUNTER — Other Ambulatory Visit: Payer: Self-pay

## 2019-11-26 ENCOUNTER — Ambulatory Visit: Payer: Medicare Other | Admitting: Vascular Surgery

## 2019-11-26 ENCOUNTER — Telehealth: Payer: Self-pay | Admitting: *Deleted

## 2019-11-26 DIAGNOSIS — E11621 Type 2 diabetes mellitus with foot ulcer: Secondary | ICD-10-CM | POA: Diagnosis not present

## 2019-11-26 NOTE — Progress Notes (Signed)
[name], [name] ([id number]) Visit Report for 11/26/2019 HPI Details Patient Name: Date of Service: [name], [name] 11/26/2019 7:30 AM Medical Record [id number] Patient Account Number: [id number] Date of Birth/Sex: Treating RN: 03-Sep-1950 (69 y.o. [name] Primary Care Provider: [name] Other Clinician: Referring Provider: Treating Provider/Extender:Hero Kulish, Wayland Denis, Clelia Schaumann in Treatment: 5 History of Present Illness HPI Description: ADMISSION 10/18/2019 Patient is a 69 year old man with type 2 diabetes peripheral neuropathy and a Charcot foot. The problem I think began in March 2020 just about a year ago when he was admitted to hospital from 10/19/2018 through 10/24/2018 with a wound on his left lateral foot intense cellulitis. MRI did not suggest osteomyelitis but did note a draining sinus. He apparently received a prolonged course of antibiotics. His hemoglobin A1c at that time was 13.9. He was referred to Dr. [name] where he appears to been followed for probably 5 or 6 months. He was subsequently referred himself to Dr. [name] of podiatry. He has been offloaded in a surgical shoe with a Pegasys sole. He has been mostly using Medihoney and episodic debridement of intense surrounding callus. He has been referred here for further management of this difficult the heel wound area and consideration of hyperbaric oxygen The patient had an angiogram in November. He did not have any proximal stenoses but he did have a 70 to 80% peroneal artery occlusion as well as an 80 to 95% stenosis of the anterior tibia which underwent an angioplasty. Follow-up ABIs were done on 07/02/2019 showed an ABI on the right of 1.26 on the left at 1.28 with TBI's of 0.65 and 0.67 respectively. Waveforms were triphasic Past medical history includes type 2 diabetes with neuropathy, PAD and retinopathy, prostate cancer, gastroesophageal reflux disease, chronic kidney disease, small bowel  obstruction 10/25/2019. Patient's x-ray of the left foot from last time showed no evidence of osteomyelitis. Chronic Charcot/neuropathic changes as expected. Notable soft tissue ulcer involving the plantar aspect underlying the forefoot. The patient actually has 2 wounds. One on the mid forefoot limited to breakdown of skin. The larger area is actually on the lateral foot. We have been using silver alginate to both wound areas. 3/15; patient came back for the obligatory first total contact cast change. He was apparently walking on the cast without the boot but otherwise tolerated it well and per our intake nurse the wounds look satisfactory. He will be back later in the week for his wound care visit 3/18; patient's wound on the plantar foot looks smaller of lateral foot about the same. Both required debridement. There was some macerated tissue between the 2 wounds which were also removed. We have been using silver alginate. 3/26 1 week follow-up. Total contact cast. Plantar foot wound has closed over. The area on the lateral part of his foot is still open but smaller. Using silver alginate on the wounds 11/15/2019 upon evaluation today patient appears to be doing well with regard to his wound. He is making progress and fortunately the wound is measuring smaller and looking better at this point. Fortunately there is no signs of active infection which is also good news we been using a total contact cast up to this point. 4/9; small wound now on the left lateral foot not technically on the plantar surface although I think he may be somebody who walks partially on the outside of his foot. The more extensive wound on the plantar foot healed 2 weeks ago. The patient tells me that his mother is recently  passed away. He wants the total contact cast off Week in preparation for a funeral in [place]. We'll bring him back in on Wednesday to try and accommodate this. We also changed the [facility]  might give [place] a quick chance to have a look at this on that date 4/13; patient is here for a total contact cast change. The wound looks quite healthy we reapplied Hydrofera Blue. He will be back next week for review Electronic Signature(s) Signed: 11/26/2019 5:51:00 PM By: Linton Ham MD Entered By: Linton Ham on 11/26/2019 08:12:00 -------------------------------------------------------------------------------- Physical Exam Details Patient Name: Date of Service: [name], [name] 11/26/2019 7:30 AM Medical Record [id number] Patient Account Number: [id number] Date of Birth/Sex: Treating RN: 1950-11-28 (69 y.o. [name] Primary Care Provider: [name] Other Clinician: Referring Provider: Treating Provider/Extender:Caryl Manas, Wayland Denis, Clelia Schaumann in Treatment: 5 Constitutional Patient is hypertensive.. Pulse regular and within target range for patient.[name] Respirations regular, non-labored and within target range.. Temperature is normal and within the target range for the patient.[name] Appears in no distress. Notes Wound exam; per our intake nurse the wound looks very healthy on the lateral foot. We have reapplied total contact cast in the standard fashion Electronic Signature(s) Signed: 11/26/2019 5:51:00 PM By: Linton Ham MD Entered By: Linton Ham on 11/26/2019 08:12:43 -------------------------------------------------------------------------------- Physician Orders Details Patient Name: Date of Service: [name] 11/26/2019 7:30 AM Medical Record [id number] Patient Account Number: [id number] Date of Birth/Sex: Treating RN: March 07, 1951 (69 y.o. [name] Primary Care Provider: [name] Other Clinician: Referring Provider: Treating Provider/Extender:Jacob Cicero, Wayland Denis, Clelia Schaumann in Treatment: 5 Verbal / Phone Orders: No Diagnosis Coding ICD-10 Coding Code Description [phone] Non-pressure chronic ulcer of  other part of left foot with fat layer exposed E11.51 Type 2 diabetes mellitus with diabetic peripheral angiopathy without gangrene E11.621 Type 2 diabetes mellitus with foot ulcer L97.521 Non-pressure chronic ulcer of other part of left foot limited to breakdown of skin M14.672 Charcot's joint, left ankle and foot E11.40 Type 2 diabetes mellitus with diabetic neuropathy, unspecified Follow-up Appointments Return Appointment in 1 week. Dressing Change Frequency Wound #1 Left,Lateral Foot Do not change entire dressing for one week. Skin Barriers/Peri-Wound Care Barrier cream - to macerated periwound area. Wound Cleansing Wound #1 Left,Lateral Foot May shower with protection. - cast protector Primary Wound Dressing Wound #1 Left,Lateral Foot Hydrofera Blue Secondary Dressing Wound #1 Left,Lateral Foot Foam - donut Dry Gauze - tape Other: - cushion area to plantar foot Off-Loading Total Contact Cast to Left [facility] skilled nursing for wound care. - [facility], HOLD for wound care due to cast placed on. Electronic Signature(s) Signed: 11/26/2019 5:51:00 PM By: Linton Ham MD Signed: 11/26/2019 6:03:05 PM By: Deon Pilling Entered By: Deon Pilling on 11/26/2019 07:52:36 -------------------------------------------------------------------------------- Problem List Details Patient Name: Date of Service: [name], [name] 11/26/2019 7:30 AM Medical Record [id number] Patient Account Number: [id number] Date of Birth/Sex: Treating RN: 1950-11-30 (68 y.o. [name] Primary Care Provider: [name] Other Clinician: Referring Provider: Treating Provider/Extender:Mushka Laconte, Wayland Denis, Clelia Schaumann in Treatment: 5 Active Problems ICD-10 Evaluated Encounter Code Description Active Date Today Diagnosis L97.522 Non-pressure chronic ulcer of other part of left foot 10/18/2019 No Yes with fat layer exposed E11.51 Type 2 diabetes  mellitus with diabetic peripheral 10/18/2019 No Yes angiopathy without gangrene E11.621 Type 2 diabetes mellitus with foot ulcer 10/18/2019 No Yes L97.521 Non-pressure chronic ulcer of other part of left foot 10/25/2019 No Yes limited to breakdown of  skin [id number] Charcot's joint, left ankle and foot 10/18/2019 No Yes E11.40 Type 2 diabetes mellitus with diabetic neuropathy, 10/18/2019 No Yes unspecified Inactive Problems Resolved Problems Electronic Signature(s) Signed: 11/26/2019 5:51:00 PM By: Linton Ham MD Entered By: Linton Ham on 11/26/2019 08:11:20 -------------------------------------------------------------------------------- Progress Note Details Patient Name: Date of Service: [name] 11/26/2019 7:30 AM Medical Record [id number] Patient Account Number: [id number] Date of Birth/Sex: Treating RN: 1950-09-19 (69 y.o. [name] Primary Care Provider: [name] Other Clinician: Referring Provider: Treating Provider/Extender:Alfonzo Arca, Wayland Denis, Clelia Schaumann in Treatment: 5 Subjective History of Present Illness (HPI) ADMISSION 10/18/2019 Patient is a 69 year old man with type 2 diabetes peripheral neuropathy and a Charcot foot. The problem I think began in March 2020 just about a year ago when he was admitted to hospital from 10/19/2018 through 10/24/2018 with a wound on his left lateral foot intense cellulitis. MRI did not suggest osteomyelitis but did note a draining sinus. He apparently received a prolonged course of antibiotics. His hemoglobin A1c at that time was 13.9. He was referred to Dr. [name] where he appears to been followed for probably 5 or 6 months. He was subsequently referred himself to Dr. [name] of podiatry. He has been offloaded in a surgical shoe with a Pegasys sole. He has been mostly using Medihoney and episodic debridement of intense surrounding callus. He has been referred here for further management of this difficult the heel  wound area and consideration of hyperbaric oxygen The patient had an angiogram in November. He did not have any proximal stenoses but he did have a 70 to 80% peroneal artery occlusion as well as an 80 to 95% stenosis of the anterior tibia which underwent an angioplasty. Follow-up ABIs were done on 07/02/2019 showed an ABI on the right of 1.26 on the left at 1.28 with TBI's of 0.65 and 0.67 respectively. Waveforms were triphasic Past medical history includes type 2 diabetes with neuropathy, PAD and retinopathy, prostate cancer, gastroesophageal reflux disease, chronic kidney disease, small bowel obstruction 10/25/2019. Patient's x-ray of the left foot from last time showed no evidence of osteomyelitis. Chronic Charcot/neuropathic changes as expected. Notable soft tissue ulcer involving the plantar aspect underlying the forefoot. The patient actually has 2 wounds. One on the mid forefoot limited to breakdown of skin. The larger area is actually on the lateral foot. We have been using silver alginate to both wound areas. 3/15; patient came back for the obligatory first total contact cast change. He was apparently walking on the cast without the boot but otherwise tolerated it well and per our intake nurse the wounds look satisfactory. He will be back later in the week for his wound care visit 3/18; patient's wound on the plantar foot looks smaller of lateral foot about the same. Both required debridement. There was some macerated tissue between the 2 wounds which were also removed. We have been using silver alginate. 3/26 1 week follow-up. Total contact cast. Plantar foot wound has closed over. The area on the lateral part of his foot is still open but smaller. Using silver alginate on the wounds 11/15/2019 upon evaluation today patient appears to be doing well with regard to his wound. He is making progress and fortunately the wound is measuring smaller and looking better at this point. Fortunately  there is no signs of active infection which is also good news we been using a total contact cast up to this point. 4/9; small wound now on the left lateral foot not technically on the  plantar surface although I think he may be somebody who walks partially on the outside of his foot. The more extensive wound on the plantar foot healed 2 weeks ago. The patient tells me that his mother is recently passed away. He wants the total contact cast off Week in preparation for a funeral in [place]. We'll bring him back in on Wednesday to try and accommodate this. We also changed the [facility] might give [place] a quick chance to have a look at this on that date 4/13; patient is here for a total contact cast change. The wound looks quite healthy we reapplied Hydrofera Blue. He will be back next week for review Objective Constitutional Patient is hypertensive.. Pulse regular and within target range for patient.[name] Respirations regular, non-labored and within target range.. Temperature is normal and within the target range for the patient.[name] Appears in no distress. Vitals Time Taken: 7:43 AM, Height: 67 in, Weight: 173 lbs, BMI: 27.1, Temperature: 98.3 F, Pulse: 74 bpm, Respiratory Rate: 18 breaths/min, Blood Pressure: 187/64 mmHg. General Notes: Wound exam; per our intake nurse the wound looks very healthy on the lateral foot. We have reapplied total contact cast in the standard fashion Integumentary (Hair, Skin) Wound #1 status is Open. Original cause of wound was Gradually Appeared. The wound is located on the Left,Lateral Foot. The wound measures 0.9cm length x 1cm width x 0.2cm depth; 0.707cm^2 area and 0.141cm^3 volume. There is Fat Layer (Subcutaneous Tissue) Exposed exposed. There is no tunneling or undermining noted. There is a small amount of serosanguineous drainage noted. The wound margin is flat and intact. There is large (67- 100%) red granulation within the wound bed. There is no  necrotic tissue within the wound bed. Assessment Active Problems ICD-10 Non-pressure chronic ulcer of other part of left foot with fat layer exposed Type 2 diabetes mellitus with diabetic peripheral angiopathy without gangrene Type 2 diabetes mellitus with foot ulcer Non-pressure chronic ulcer of other part of left foot limited to breakdown of skin Charcot's joint, left ankle and foot Type 2 diabetes mellitus with diabetic neuropathy, unspecified Procedures Wound #1 Pre-procedure diagnosis of Wound #1 is a Diabetic Wound/Ulcer of the Lower Extremity located on the Left,Lateral Foot . There was a Total Contact Cast Procedure by Ricard Dillon., MD. Post procedure Diagnosis Wound #1: Same as Pre-Procedure Plan Follow-up Appointments: Return Appointment in 1 week. Dressing Change Frequency: Wound #1 Left,Lateral Foot: Do not change entire dressing for one week. Skin Barriers/Peri-Wound Care: Barrier cream - to macerated periwound area. Wound Cleansing: Wound #1 Left,Lateral Foot: May shower with protection. - cast protector Primary Wound Dressing: Wound #1 Left,Lateral Foot: Hydrofera Blue Secondary Dressing: Wound #1 Left,Lateral Foot: Foam - donut Dry Gauze - tape Other: - cushion area to plantar foot Off-Loading: Total Contact Cast to Left Lower Extremity Home Health: [facility] skilled nursing for wound care. - [facility], HOLD for wound care due to cast placed on. 1. Continue Hydrofera Blue with a total contact cast Electronic Signature(s) Signed: 11/26/2019 5:51:00 PM By: Linton Ham MD Entered By: Linton Ham on 11/26/2019 08:13:07 -------------------------------------------------------------------------------- Total Contact Cast Details Patient Name: Date of Service: [name], [name] 11/26/2019 7:30 AM Medical Record [id number] Patient Account Number: [id number] Date of Birth/Sex: Treating RN: 1951/08/01 (69 y.o. [name] Primary  Care Provider: [name] Other Clinician: Referring Provider: Treating Provider/Extender:[place], Wayland Denis, Clelia Schaumann in Treatment: 5 Total Contact Cast Applied for Wound Assessment: Wound #1 Left,Lateral Foot Performed By: Physician Linton Ham  G., MD Post Procedure Diagnosis Same as Pre-procedure Electronic Signature(s) Signed: 11/26/2019 5:51:00 PM By: Linton Ham MD Entered By: Linton Ham on 11/26/2019 08:11:35 -------------------------------------------------------------------------------- SuperBill Details Patient Name: Date of Service: [name] 11/26/2019 Medical Record [id number] Patient Account Number: [id number] Date of Birth/Sex: Treating RN: 1950-12-22 (68 y.o. [name] Primary Care Provider: [name] Other Clinician: Referring Provider: Treating Provider/Extender:Keshon Markovitz, Wayland Denis, Clelia Schaumann in Treatment: 5 Diagnosis Coding ICD-10 Codes Code Description [phone] Non-pressure chronic ulcer of other part of left foot with fat layer exposed E11.51 Type 2 diabetes mellitus with diabetic peripheral angiopathy without gangrene E11.621 Type 2 diabetes mellitus with foot ulcer L97.521 Non-pressure chronic ulcer of other part of left foot limited to breakdown of skin M14.672 Charcot's joint, left ankle and foot E11.40 Type 2 diabetes mellitus with diabetic neuropathy, unspecified Facility Procedures CPT4 Code Description: [id number] - APPLY TOTAL CONTACT LEG CAST ICD-10 Diagnosis Description L97.522 Non-pressure chronic ulcer of other part of left foot with E11.621 Type 2 diabetes mellitus with foot ulcer Modifier: fat layer exp Quantity: 1 osed Physician Procedures CPT4 Code Description: [id number] - WC PHYS APPLY TOTAL CONTACT CAST ICD-10 Diagnosis Description L97.522 Non-pressure chronic ulcer of other part of left foot with f E11.621 Type 2 diabetes mellitus with foot ulcer Modifier: at layer  exp Quantity: 1 osed Electronic Signature(s) Signed: 11/26/2019 5:51:00 PM By: Linton Ham MD Entered By: Linton Ham on 11/26/2019 08:13:15

## 2019-11-26 NOTE — Telephone Encounter (Signed)
Pt presents to office for handicap sticker. Pt is being seen by [place]. Dr. Marcene Duos 3 month handicap sticker, future handicap sticker will need to be issued by the physician his wound is being treated.

## 2019-11-27 NOTE — Progress Notes (Signed)
[place], [place] ([id number]) Visit Report for 10/31/2019 Arrival Information Details Patient Name: Date of Service: GUSTIN, ZOBRIST 10/31/2019 10:15 AM Medical Record [id number] Patient Account Number: [id number] Date of Birth/Sex: Treating RN: [birth year] (69 y.o. [name] Primary Care Danni Leabo: [name] Other Clinician: Referring Jalila Goodnough: Treating Jacksyn Beeks/Extender:Robson, Wayland Denis, Clelia Schaumann in Treatment: 1 Visit Information History Since Last Visit Added or deleted any medications: No Patient Arrived: Wheel Chair Any new allergies or adverse reactions: No Arrival Time: 10:35 Had a fall or experienced change in No Accompanied By: self activities of daily living that may affect Transfer Assistance: Manual risk of falls: Patient Identification Verified: Yes Signs or symptoms of abuse/neglect since last No Secondary Verification Process Yes visito Completed: Hospitalized since last visit: No Patient Requires Transmission- No Implantable device outside of the clinic excluding No Based Precautions: cellular tissue based products placed in the center Patient Has Alerts: Yes since last visit: Patient Alerts: Patient on Blood Has Dressing in Place as Prescribed: Yes Thinner Pain Present Now: No Electronic Signature(s) Signed: 11/27/2019 9:21:08 AM By: Sandre Kitty Entered By: Sandre Kitty on 10/31/2019 10:35:52 -------------------------------------------------------------------------------- Lower Extremity Assessment Details Patient Name: Date of Service: JARRIEL, PAPILLION 10/31/2019 10:15 AM Medical Record [id number] Patient Account Number: [id number] Date of Birth/Sex: Treating RN: 1951-03-02 (69 y.o. [name] Primary Care Thaily Hackworth: [name] Other Clinician: Referring Sunya Humbarger: Treating Merian Wroe/Extender:Robson, Wayland Denis, Clelia Schaumann in Treatment: 1 Edema Assessment Assessed: [Left: No] [Right:  No] Edema: [Left: N] [Right: o] Calf Left: Right: Point of Measurement: 41 cm From Medial Instep 31 cm cm Ankle Left: Right: Point of Measurement: 9 cm From Medial Instep 21 cm cm Vascular Assessment Pulses: Dorsalis Pedis Palpable: [Left:Yes] Electronic Signature(s) Signed: 10/31/2019 4:45:01 PM By: Baruch Gouty RN, BSN Entered By: Baruch Gouty on 10/31/2019 11:07:49 -------------------------------------------------------------------------------- Multi Wound Chart Details Patient Name: Date of Service: [name] 10/31/2019 10:15 AM Medical Record [id number] Patient Account Number: [id number] Date of Birth/Sex: Treating RN: 04-12-1951 (69 y.o. [name] Primary Care Shikita Vaillancourt: [name] Other Clinician: Referring Javari Bufkin: Treating Arlisha Patalano/Extender:Robson, Wayland Denis, Clelia Schaumann in Treatment: 1 Vital Signs Height(in): 35 Pulse(bpm): 60 Weight(lbs): 173 Blood Pressure(mmHg): 183/64 Body Mass Index(BMI): 27 Temperature(F): 98.3 Respiratory 16 Rate(breaths/min): Photos: [1:No Photos] [2:No Photos] [N/A:N/A] Wound Location: [1:Left Foot - Lateral] [2:Left Foot - Plantar] [N/A:N/A] Wounding Event: [1:Gradually Appeared] [2:Gradually Appeared] [N/A:N/A] Primary Etiology: [1:Diabetic Wound/Ulcer of the Diabetic Wound/Ulcer of the N/A Lower Extremity] [2:Lower Extremity] Comorbid History: [1:Congestive Heart Failure, Congestive Heart Failure, N/A Hypertension, Peripheral Hypertension, Peripheral Venous Disease, Type II Venous Disease, Type II Diabetes] [2:Diabetes] Date Acquired: [1:08/15/2018] [2:10/15/2018] [N/A:N/A] Weeks of Treatment: [1:1] [2:1] [N/A:N/A] Wound Status: [1:Open] [2:Open] [N/A:N/A] Measurements L x W x D 1.5x1.7x0.1 [2:0.2x0.2x0.1] [N/A:N/A] (cm) Area (cm) : [1:2.003] [2:0.031] [N/A:N/A] Volume (cm) : [1:0.2] [2:0.003] [N/A:N/A] % Reduction in Area: [1:29.10%] [2:90.60%] [N/A:N/A] % Reduction in Volume: [1:85.90%]  [2:95.50%] [N/A:N/A] Classification: [1:Grade 2] [2:Grade 2] [N/A:N/A] Exudate Amount: [1:Medium] [2:Medium] [N/A:N/A] Exudate Type: [1:Purulent] [2:Serosanguineous] [N/A:N/A] Exudate Color: [1:yellow, brown, green] [2:red, brown] [N/A:N/A] Wound Margin: [1:Flat and Intact] [2:Flat and Intact] [N/A:N/A] Granulation Amount: [1:Medium (34-66%)] [2:Large (67-100%)] [N/A:N/A] Granulation Quality: [1:Red, Pink, Pale] [2:Red] [N/A:N/A] Necrotic Amount: [1:Medium (34-66%)] [2:None Present (0%)] [N/A:N/A] Exposed Structures: [1:Fat Layer (Subcutaneous Tissue) Exposed: Yes Fascia: No Tendon: No Muscle: No Joint: No Bone: No] [2:Fat Layer (Subcutaneous Tissue) Exposed: Yes Fascia: No Tendon: No Muscle: No Joint: No Bone: No] [N/A:N/A] Epithelialization: [1:None] [2:Medium (34-66%)] [N/A:N/A] Debridement: [1:Debridement - Excisional] [2:Debridement - Selective/Open  Wound] [N/A:N/A] Pre-procedure [1:11:15] [2:11:15] [N/A:N/A] Verification/Time Out Taken: Pain Control: [1:Lidocaine 4% Topical Solution] [2:Lidocaine 4% Topical Solution] [N/A:N/A] Tissue Debrided: [1:Callus, Subcutaneous] [2:Callus] [N/A:N/A] Level: [1:Skin/Subcutaneous Tissue] [2:Non-Viable Tissue] [N/A:N/A] Debridement Area (sq cm):2.55 [2:4] [N/A:N/A] Instrument: [1:Curette] [2:Curette] [N/A:N/A] Bleeding: [1:None] [2:None] [N/A:N/A] Procedural Pain: [1:0] [2:0] [N/A:N/A] Post Procedural Pain: [1:0] [2:0] [N/A:N/A] Debridement Treatment Procedure was tolerated [2:Procedure was tolerated] [N/A:N/A] Response: [1:well] [2:well] Post Debridement [1:1.5x1.7x0.1] [2:0.2x0.2x0.1] [N/A:N/A] Measurements L x W x D (cm) Post Debridement [1:0.2] [2:0.003] [N/A:N/A] Volume: (cm) Procedures Performed: Debridement [1:Total Contact Cast] [2:Debridement Total Contact Cast] [N/A:N/A] Treatment Notes Electronic Signature(s) Signed: 10/31/2019 4:55:35 PM By: Deon Pilling Signed: 10/31/2019 7:02:26 PM By: Linton Ham MD Entered By: Linton Ham on 10/31/2019 12:45:55 -------------------------------------------------------------------------------- Multi-Disciplinary Care Plan Details Patient Name: Date of Service: HATEM, CULL 10/31/2019 10:15 AM Medical Record [id number] Patient Account Number: [id number] Date of Birth/Sex: Treating RN: 1950-08-16 (69 y.o. [name] Primary Care Circe Chilton: [name] Other Clinician: Referring Nyeshia Mysliwiec: Treating Chequita Mofield/Extender:Robson, Wayland Denis, Clelia Schaumann in Treatment: 1 Active Inactive Nutrition Nursing Diagnoses: Impaired glucose control: actual or potential Potential for alteratiion in Nutrition/Potential for imbalanced nutrition Goals: Patient/caregiver will maintain therapeutic glucose control Date Initiated: 10/18/2019 Target Resolution Date: 11/15/2019 Goal Status: Active Interventions: Assess HgA1c results as ordered upon admission and as needed Assess patient nutrition upon admission and as needed per policy Provide education on elevated blood sugars and impact on wound healing Treatment Activities: Patient referred to Primary Care Physician for further nutritional evaluation : 10/18/2019 Notes: Wound/Skin Impairment Nursing Diagnoses: Impaired tissue integrity Knowledge deficit related to ulceration/compromised skin integrity Goals: Patient/caregiver will verbalize understanding of skin care regimen Date Initiated: 10/18/2019 Target Resolution Date: 11/15/2019 Goal Status: Active Ulcer/skin breakdown will have a volume reduction of 30% by week 4 Date Initiated: 10/18/2019 Target Resolution Date: 11/15/2019 Goal Status: Active Interventions: Assess patient/caregiver ability to obtain necessary supplies Assess patient/caregiver ability to perform ulcer/skin care regimen upon admission and as needed Assess ulceration(s) every visit Provide education on ulcer and skin care Treatment Activities: Skin care regimen initiated : 10/18/2019 Topical  wound management initiated : 10/18/2019 Notes: Electronic Signature(s) Signed: 10/31/2019 4:55:35 PM By: Deon Pilling Entered By: Deon Pilling on 10/31/2019 10:46:04 -------------------------------------------------------------------------------- Pain Assessment Details Patient Name: Date of Service: LANIER, MILLON 10/31/2019 10:15 AM Medical Record [id number] Patient Account Number: [id number] Date of Birth/Sex: Treating RN: 1950/10/20 (69 y.o. [name] Primary Care Adylin Hankey: [name] Other Clinician: Referring Briley Bumgarner: Treating Caedon Bond/Extender:Robson, Wayland Denis, Clelia Schaumann in Treatment: 1 Active Problems Location of Pain Severity and Description of Pain Patient Has Paino No Site Locations Pain Management and Medication Current Pain Management: Electronic Signature(s) Signed: 10/31/2019 4:55:35 PM By: Deon Pilling Signed: 11/27/2019 9:21:08 AM By: Sandre Kitty Entered By: Sandre Kitty on 10/31/2019 10:36:15 -------------------------------------------------------------------------------- Patient/Caregiver Education Details Patient Name: Date of Service: [name] 3/18/2021andnbsp10:15 AM Medical Record [id number] Patient Account Number: [id number] Date of Birth/Gender: Treating RN: 07-Aug-1951 (69 y.o. [name] Primary Care Physician: [name] Other Clinician: Referring Physician: Treating Physician/Extender:Robson, Wayland Denis, Clelia Schaumann in Treatment: 1 Education Assessment Education Provided To: Patient Education Topics Provided Wound/Skin Impairment: Handouts: Skin Care Do's and Dont's Methods: Explain/Verbal Responses: Reinforcements needed Electronic Signature(s) Signed: 10/31/2019 4:55:35 PM By: Deon Pilling Entered By: Deon Pilling on 10/31/2019 10:46:14 -------------------------------------------------------------------------------- Wound Assessment Details Patient Name: Date of  Service: RAIYAN, DALESANDRO 10/31/2019 10:15 AM Medical Record [id number] Patient Account Number: [id number] Date of Birth/Sex: Treating RN: 03-May-1951 (69 y.o. Lorette Ang, Tammi Klippel Primary  Care Samari Gorby: [name] Other Clinician: Referring Zaide Mcclenahan: Treating Phoua Hoadley/Extender:Robson, Wayland Denis, Clelia Schaumann in Treatment: 1 Wound Status Wound Number: 1 Primary Diabetic Wound/Ulcer of the Lower Extremity Etiology: Wound Location: Left Foot - Lateral Wound Open Wounding Event: Gradually Appeared Status: Date Acquired: 08/15/2018 Comorbid Congestive Heart Failure, Hypertension, Weeks Of Treatment: 1 History: Peripheral Venous Disease, Type II Diabetes Clustered Wound: No Photos Photo Uploaded By: Mikeal Hawthorne on 11/01/2019 15:53:11 Wound Measurements Length: (cm) 1.5 Width: (cm) 1.7 Depth: (cm) 0.1 Area: (cm) 2.003 Volume: (cm) 0.2 Wound Description Classification: Grade 2 Wound Margin: Flat and Intact Exudate Amount: Medium Exudate Type: Purulent Exudate Color: yellow, brown, green Wound Bed Granulation Amount: Medium (34-66%) Granulation Quality: Red, Pink, Pale Necrotic Amount: Medium (34-66%) Necrotic Quality: Adherent Slough After Cleansing: No rino Yes Exposed Structure osed: No (Subcutaneous Tissue) Exposed: Yes osed: No osed: No sed: No ed: No % Reduction in Area: 29.1% % Reduction in Volume: 85.9% Epithelialization: None Tunneling: No Undermining: No Foul Odor Slough/Fib Fascia Exp Fat Layer Tendon Exp Muscle Exp Joint Expo Bone Expos Electronic Signature(s) Signed: 10/31/2019 4:45:01 PM By: Baruch Gouty RN, BSN Signed: 10/31/2019 4:55:35 PM By: Deon Pilling Entered By: Baruch Gouty on 10/31/2019 11:08:20 -------------------------------------------------------------------------------- Wound Assessment Details Patient Name: Date of Service: REMBERT, BROWE 10/31/2019 10:15 AM Medical Record [id number] Patient  Account Number: [id number] Date of Birth/Sex: Treating RN: [birth year] (69 y.o. [name] Primary Care Sondra Blixt: [name] Other Clinician: Referring Ademide Schaberg: Treating Jahseh Lucchese/Extender:Robson, Wayland Denis, Clelia Schaumann in Treatment: 1 Wound Status Wound Number: 2 Primary Diabetic Wound/Ulcer of the Lower Extremity Etiology: Wound Location: Left Foot - Plantar Wound Open Wounding Event: Gradually Appeared Status: Date Acquired: 10/15/2018 Comorbid Congestive Heart Failure, Hypertension, Weeks Of Treatment: 1 History: Peripheral Venous Disease, Type II Diabetes Clustered Wound: No Photos Photo Uploaded By: Mikeal Hawthorne on 11/01/2019 15:53:25 Wound Measurements Length: (cm) 0.2 Width: (cm) 0.2 Depth: (cm) 0.1 Area: (cm) 0.031 Volume: (cm) 0.003 Wound Description Classification: Grade 2 Wound Margin: Flat and Intact Exudate Amount: Medium Exudate Type: Serosanguineous Exudate Color: red, brown Wound Bed Granulation Amount: Large (67-100%) Granulation Quality: Red Necrotic Amount: None Present (0%) After Cleansing: No rino Yes Exposed Structure osed: No (Subcutaneous Tissue) Exposed: Yes osed: No osed: No sed: No ed: No % Reduction in Area: 90.6% % Reduction in Volume: 95.5% Epithelialization: Medium (34-66%) Tunneling: No Undermining: No Foul Odor Slough/Fib Fascia Exp Fat Layer Tendon Exp Muscle Exp Joint Expo Bone Expos Electronic Signature(s) Signed: 10/31/2019 4:45:01 PM By: Baruch Gouty RN, BSN Signed: 10/31/2019 4:55:35 PM By: Deon Pilling Entered By: Baruch Gouty on 10/31/2019 11:08:46 -------------------------------------------------------------------------------- [place] Details Patient Name: Date of Service: [name] 10/31/2019 10:15 AM Medical Record [id number] Patient Account Number: [id number] Date of Birth/Sex: Treating RN: 1950/11/02 (69 y.o. [name] Primary Care Jala Dundon: [name] Other Clinician: Referring Emmanual Gauthreaux: Treating Pia Jedlicka/Extender:Robson, Wayland Denis, Clelia Schaumann in Treatment: 1 Vital Signs Time Taken: 10:35 Temperature (F): 98.3 Height (in): 67 Pulse (bpm): 67 Weight (lbs): 173 Respiratory Rate (breaths/min): 16 Body Mass Index (BMI): 27.1 Blood Pressure (mmHg): 183/64 Reference Range: 80 - 120 mg / dl Electronic Signature(s) Signed: 11/27/2019 9:21:08 AM By: Sandre Kitty Entered By: Sandre Kitty on 10/31/2019 10:36:09

## 2019-11-27 NOTE — Progress Notes (Signed)
[name], [name] ([id number]) Visit Report for 11/22/2019 Arrival Information Details Patient Name: Date of Service: [name], [name] 11/22/2019 3:45 PM Medical Record [id number] Patient Account Number: [id number] Date of Birth/Sex: Treating [name]: March 07, 1951 (69 y.o. [name] Primary Care [name]: [name] Other Clinician: Referring [name]: Treating [name]/Extender:[name], [name], [name] in Treatment: 5 Visit Information History Since Last Visit Added or deleted any medications: No Patient Arrived: Ambulatory Any [name] allergies or adverse reactions: No Arrival Time: 16:33 Had a fall or experienced change in No Accompanied By: self activities of daily living that may affect Transfer Assistance: None risk of falls: Patient Identification Verified: Yes Signs or symptoms of abuse/neglect since last No Secondary Verification Process Yes visito Completed: Hospitalized since last visit: No Patient Requires Transmission- No Implantable device outside of the clinic excluding No Based Precautions: cellular tissue based products placed in the center Patient Has Alerts: Yes since last visit: Patient Alerts: Patient on Blood Has Dressing in Place as Prescribed: Yes Thinner Pain Present Now: Yes Electronic Signature(s) Signed: 11/27/2019 9:19:04 AM By: [name] Entered By: [name] on 11/22/2019 16:34:02 -------------------------------------------------------------------------------- Encounter Discharge Information Details Patient Name: Date of Service: [name] 11/22/2019 3:45 PM Medical Record [id number] Patient Account Number: [id number] Date of Birth/Sex: Treating [name]: 28-Feb-1951 (69 y.o. [name] Primary Care [name]: [name] Other Clinician: Referring [name]: Treating [name]/Extender:[name], [name], [name] in Treatment: 5 Encounter Discharge Information Items Discharge  Condition: Stable Ambulatory Status: Ambulatory Discharge Destination: Home Transportation: Private Auto Accompanied By: self Schedule Follow-up Appointment: Yes Clinical Summary of Care: Electronic Signature(s) Signed: 11/22/2019 5:55:58 PM By: [name] Entered By: [name] on 11/22/2019 17:55:40 -------------------------------------------------------------------------------- Lower Extremity Assessment Details Patient Name: Date of Service: [name] 11/22/2019 3:45 PM Medical Record [id number] Patient Account Number: [id number] Date of Birth/Sex: Treating [name]: October 28, 1950 (69 y.o. [name] Primary Care Rhandi Despain: [name] Other Clinician: Referring Salia Cangemi: Treating [name]/Extender:[name], [name], [name] in Treatment: 5 Edema Assessment Assessed: [Left: No] [Right: No] Edema: [Left: N] [Right: o] Calf Left: Right: Point of Measurement: 41 cm From Medial Instep 33.5 cm cm Ankle Left: Right: Point of Measurement: 9 cm From Medial Instep 22 cm cm Vascular Assessment Pulses: Dorsalis Pedis Palpable: [Left:Yes] Electronic Signature(s) Signed: 11/22/2019 6:10:03 PM By: [name], BSN Entered By: [name] Gouty on 11/22/2019 17:02:34 -------------------------------------------------------------------------------- Multi Wound Chart Details Patient Name: Date of Service: [name] 11/22/2019 3:45 PM Medical Record [id number] Patient Account Number: [id number] Date of Birth/Sex: Treating [name]: September 19, 1950 (69 y.o. 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] Comorbid History: [1:Congestive Heart Failure, N/A Hypertension, Peripheral Venous Disease, Type II Diabetes] Date Acquired: [1:08/15/2018] [N/A:N/A] Weeks of Treatment: [1:5] [N/A:N/A] Wound Status: [1:Open] [N/A:N/A] Measurements L x W x D 0.9x1x0.2 [N/A:N/A] (cm) Area (cm) : [1:0.707] [N/A:N/A] Volume (cm) : [1:0.141] [N/A:N/A] % Reduction in Area: [1:75.00%] [N/A:N/A] % Reduction in Volume: 90.00% [N/A:N/A] Classification: [1:Grade 2] [N/A:N/A] Exudate Amount: [1:Small] [N/A:N/A] Exudate Type: [1:Serosanguineous] [N/A:N/A] Exudate Color: [1:red, brown] [N/A:N/A] Wound Margin: [1:Flat and Intact] [N/A:N/A] Granulation Amount: [1:Large (67-100%)] [N/A:N/A] Granulation Quality: [1:Red] [N/A:N/A] Necrotic Amount: [1:None Present (0%)] [N/A:N/A] Exposed Structures: [1:Fat  Layer (Subcutaneous N/A Tissue) Exposed: Yes Fascia: No Tendon: No Muscle: No Joint: No Bone: No] Epithelialization: [1:Small (1-33%)] [N/A:N/A N/A] Treatment Notes Wound #1 (Left, Lateral Foot) 1. Cleanse With Wound Cleanser Soap and water 3. Primary Dressing Applied Hydrofera Blue 4. Secondary Dressing Dry Gauze Foam 5. Secured With Medipore tape 7. Footwear/Offloading device applied Total Contact Cast Notes foam donut. TCC applied by [name]. Electronic Signature(s) Signed: 11/25/2019 9:07:11 AM By: [name] Signed: 11/25/2019 5:40:42 PM By: [name] Entered By: [name] Ham on 11/24/2019 08:47:36 -------------------------------------------------------------------------------- Multi-Disciplinary Care Plan Details Patient Name: Date of Service: [name] 11/22/2019 3:45 PM Medical Record [id number] Patient Account Number: [id number] Date of Birth/Sex: Treating [name]: 1951-03-15 (69 y.o. [name] Primary Care Starlette Thurow: [name] Other Clinician: Referring Leshawn [name]: Treating [name]/Extender:[name], [name], [name] in Treatment: 5 Active Inactive Nutrition Nursing Diagnoses: Impaired glucose control: actual or potential Potential for alteratiion in Nutrition/Potential for imbalanced nutrition Goals: Patient/caregiver will maintain therapeutic glucose control Date Initiated: 10/18/2019 Target Resolution Date: 12/13/2019 Goal Status: Active Interventions: Assess HgA1c results as ordered upon admission and as needed Assess patient nutrition upon admission and as needed per policy Provide education on elevated blood sugars and impact on wound healing Treatment Activities: Patient referred to Primary Care Physician for further nutritional evaluation : 10/18/2019 Notes: Wound/Skin Impairment Nursing Diagnoses: Impaired tissue integrity Knowledge deficit related to ulceration/compromised skin integrity Goals: Patient/caregiver will verbalize understanding of skin care regimen Date Initiated: 10/18/2019 Target Resolution Date: 12/13/2019 Goal Status: Active Ulcer/skin breakdown will have a volume reduction of 30% by week 4 Date Initiated: 10/18/2019 Target Resolution Date: 12/13/2019 Goal Status: Active Interventions: Assess patient/caregiver ability to obtain necessary supplies Assess patient/caregiver ability to perform ulcer/skin care regimen upon admission and as needed Assess ulceration(s) every visit Provide education on ulcer and skin care Treatment Activities: Skin care regimen initiated : 10/18/2019 Topical wound management initiated : 10/18/2019 Notes: Electronic Signature(s) Signed: 11/22/2019 6:04:52 PM By: [name] Entered By: [name] on 11/22/2019 17:14:30 -------------------------------------------------------------------------------- Pain Assessment Details Patient Name: Date of Service: WHITMAN, MEINHARDT 11/22/2019 3:45  PM Medical Record [id number] Patient Account Number: [id number] Date of Birth/Sex: Treating [name]: May 17, 1951 (69 y.o. [name] Primary Care Maze Corniel: [name] Other Clinician: Referring Meade Hogeland: Treating Mekhai Venuto/Extender:[name], [name], [name] in Treatment: 5 Active Problems Location of Pain Severity and Description of Pain Patient Has Paino Yes Site Locations Rate the pain. Current Pain Level: 4 Pain Management and Medication Current Pain Management: Electronic Signature(s) Signed: 11/22/2019 6:04:52 PM By: [name] Signed: 11/27/2019 9:19:04 AM By: [name] Entered By: [name] on 11/22/2019 16:36:21 -------------------------------------------------------------------------------- Patient/Caregiver Education Details Patient Name: Date of Service: [name] 4/9/2021andnbsp3:45 PM Medical Record [id number] Patient Account Number: [id number] Date of Birth/Gender: Treating [name]: [birth year] (69 y.o. [name] Primary Care Physician: [name] Other Clinician: Referring Physician: Treating Physician/Extender:[name], [name], [name] in Treatment: 5 Education Assessment Education Provided To: Patient Education Topics Provided Elevated Blood Sugar/ Impact on Healing: Methods: Explain/Verbal Responses: State content correctly Wound/Skin Impairment: Methods: Explain/Verbal Responses: State content correctly Electronic Signature(s) Signed: 11/22/2019 6:04:52 PM By: [name] Entered By: [name] on 11/22/2019 17:14:50 -------------------------------------------------------------------------------- Wound Assessment Details Patient Name: Date of Service: MARKAS, ALDREDGE 11/22/2019 3:45 PM Medical Record [id number] Patient Account Number: [id number] Date of Birth/Sex: Treating [name]: 07-Sep-1950 (69 y.o. [name] Primary Care Teala Daffron: [name] Other Clinician: Referring Jamol Ginyard: Treating Ansley Stanwood/Extender:[name], [name], [name] in Treatment: 5 Wound Status Wound  Number: 1 Primary Diabetic Wound/Ulcer of the Lower Extremity Etiology: Wound Location: Left, Lateral Foot Wound Open Wounding Event: Gradually Appeared Status: Date Acquired: 08/15/2018 Comorbid Congestive Heart Failure, Hypertension, Weeks Of Treatment: 5 History: Peripheral Venous Disease, Type II Diabetes Clustered Wound: No Wound Measurements Length: (cm) 0.9 % Reduction Width: (cm) 1 % Reduction Depth: (cm) 0.2 Epitheliali Area: (cm) 0.707 Tunneling: Volume: (cm) 0.141 Underminin Wound Description Classification: Grade 2 Wound Margin: Flat and Intact Exudate Amount: Small Exudate Type: Serosanguineous Exudate Color: red, brown Wound Bed Granulation Amount: Large (67-100%) Granulation Quality: Red Necrotic Amount: None Present (0%) Foul Odor After Cleansing: No Slough/Fibrino Yes Exposed Structure Fascia Exposed: No Fat Layer (Subcutaneous Tissue) Exposed: Yes Tendon Exposed: No Muscle Exposed: No Joint Exposed: No Bone Exposed: No in Area: 75% in Volume: 90% zation: Small (1-33%) No g: No Electronic Signature(s) Signed: 11/22/2019 6:04:52 PM By: [name] Signed: 11/22/2019 6:10:03 PM By: [name], BSN Entered By: [name] Gouty on 11/22/2019 17:03:18 -------------------------------------------------------------------------------- [place] Details Patient Name: Date of Service: [name] 11/22/2019 3:45 PM Medical Record [id number] Patient Account Number: [id number] Date of Birth/Sex: Treating [name]: 1951/07/30 (69 y.o. [name] Primary Care Jumana Paccione: [name] Other Clinician: Referring Trejan Buda: Treating Eloyse Causey/Extender:[name], [name], [name] in Treatment: 5 Vital Signs Time Taken: 16:35 Temperature (F): 98.3 Height (in): 67 Pulse (bpm):  65 Weight (lbs): 173 Respiratory Rate (breaths/min): 18 Body Mass Index (BMI): 27.1 Blood Pressure (mmHg): 148/58 Reference Range: 80 - 120 mg / dl Electronic Signature(s) Signed: 11/27/2019 9:19:04 AM By: [name] Entered By: [name] on 11/22/2019 16:36:08

## 2019-11-27 NOTE — Progress Notes (Signed)
Mckeever, [place] ([id number]) Visit Report for 11/26/2019 Arrival Information Details Patient Name: Date of Service: LEHMAN, WHITELEY 11/26/2019 7:30 AM Medical Record [id number] Patient Account Number: [id number] Date of Birth/Sex: Treating RN: August 04, 1951 (69 y.o. [name] Primary Care Laddie Naeem: [name] Other Clinician: Referring Winslow Ederer: Treating Jahvier Aldea/Extender:Robson, Wayland Denis, Clelia Schaumann in Treatment: 5 Visit Information History Since Last Visit Added or deleted any medications: No Patient Arrived: Ambulatory Any new allergies or adverse reactions: No Arrival Time: 07:42 Had a fall or experienced change in No Accompanied By: self activities of daily living that may affect Transfer Assistance: None risk of falls: Patient Identification Verified: Yes Signs or symptoms of abuse/neglect since last No Secondary Verification Process Yes visito Completed: Hospitalized since last visit: No Patient Requires Transmission- No Implantable device outside of the clinic excluding No Based Precautions: cellular tissue based products placed in the center Patient Has Alerts: Yes since last visit: Patient Alerts: Patient on Blood Has Dressing in Place as Prescribed: Yes Thinner Pain Present Now: No Electronic Signature(s) Signed: 11/27/2019 9:19:04 AM By: Sandre Kitty Entered By: Sandre Kitty on 11/26/2019 07:43:51 -------------------------------------------------------------------------------- Encounter Discharge Information Details Patient Name: Date of Service: [name] 11/26/2019 7:30 AM Medical Record [id number] Patient Account Number: [id number] Date of Birth/Sex: Treating RN: Sep 14, 1950 (69 y.o. [name] Primary Care Rishon Thilges: [name] Other Clinician: Referring Jamaya Sleeth: Treating Brilyn Tuller/Extender:Robson, Wayland Denis, Clelia Schaumann in Treatment: 5 Encounter Discharge Information Items Discharge  Condition: Stable Ambulatory Status: Ambulatory Discharge Destination: Home Transportation: Private Auto Accompanied By: self Schedule Follow-up Appointment: Yes Clinical Summary of Care: Patient Declined Electronic Signature(s) Signed: 11/26/2019 5:52:37 PM By: Kela Millin Entered By: Kela Millin on 11/26/2019 08:05:30 -------------------------------------------------------------------------------- Lower Extremity Assessment Details Patient Name: Date of Service: POLO, MCMARTIN 11/26/2019 7:30 AM Medical Record [id number] Patient Account Number: [id number] Date of Birth/Sex: Treating RN: 08-17-1950 (69 y.o. [name] Primary Care Bayleigh Loflin: [name] Other Clinician: Referring Jarius Dieudonne: Treating [place]il[place] Mcphee/Extender:Robson, Wayland Denis, Clelia Schaumann in Treatment: 5 Edema Assessment Assessed: [Left: No] [Right: No] Edema: [Left: N] [Right: o] Calf Left: Right: Point of Measurement: 41 cm From Medial Instep 33.5 cm cm Ankle Left: Right: Point of Measurement: 9 cm From Medial Instep 22 cm cm Vascular Assessment Pulses: Dorsalis Pedis Palpable: [Left:Yes] Electronic Signature(s) Signed: 11/26/2019 5:52:37 PM By: Kela Millin Entered By: Kela Millin on 11/26/2019 07:46:44 -------------------------------------------------------------------------------- Multi Wound Chart Details Patient Name: Date of Service: [name] 11/26/2019 7:30 AM Medical Record [id number] Patient Account Number: [id number] Date of Birth/Sex: Treating RN: May 30, 1951 (69 y.o. [name] Primary Care Ladarrion Telfair: [name] Other Clinician: Referring Makailyn Mccormick: Treating Timothea Bodenheimer/Extender:Robson, Wayland Denis, Clelia Schaumann in Treatment: 5 Vital Signs Height(in): 7 Pulse(bpm): 30 Weight(lbs): 173 Blood Pressure(mmHg): 187/64 Body Mass Index(BMI): 27 Temperature(F): 98.3 Respiratory 18 Rate(breaths/min): Photos: [1:No Photos]  [N/A:N/A] Wound Location: [1:Left, Lateral Foot] [N/A:N/A] Wounding Event: [1:Gradually Appeared] [N/A:N/A] Primary Etiology: [1:Diabetic Wound/Ulcer of the N/A Lower Extremity] Comorbid History: [1:Congestive Heart Failure, N/A Hypertension, Peripheral Venous Disease, Type II Diabetes] Date Acquired: [1:08/15/2018] [N/A:N/A] Weeks of Treatment: [1:5] [N/A:N/A] Wound Status: [1:Open] [N/A:N/A] Measurements L x W x D 0.9x1x0.2 [N/A:N/A] (cm) Area (cm) : [1:0.707] [N/A:N/A] Volume (cm) : [1:0.141] [N/A:N/A] % Reduction in Area: [1:75.00%] [N/A:N/A] % Reduction in Volume: 90.00% [N/A:N/A] Classification: [1:Grade 2] [N/A:N/A] Exudate Amount: [1:Small] [N/A:N/A] Exudate Type: [1:Serosanguineous] [N/A:N/A] Exudate Color: [1:red, brown] [N/A:N/A] Wound Margin: [1:Flat and Intact] [N/A:N/A] Granulation Amount: [1:Large (67-100%)] [N/A:N/A] Granulation Quality: [1:Red] [N/A:N/A] Necrotic Amount: [1:None Present (0%)] [N/A:N/A] Exposed Structures: [1:Fat  Layer (Subcutaneous N/A Tissue) Exposed: Yes Fascia: No Tendon: No Muscle: No Joint: No Bone: No] Epithelialization: [1:Small (1-33%)] [N/A:N/A N/A] Treatment Notes Wound #1 (Left, Lateral Foot) 1. Cleanse With Wound Cleanser Soap and water 2. Periwound Care Barrier cream 3. Primary Dressing Applied Hydrofera Blue 4. Secondary Dressing Dry Gauze Foam 5. Secured With Tape 7. Footwear/Offloading device applied Total Contact Cast Notes foam donut. TCC applied by MD. Electronic Signature(s) Signed: 11/26/2019 5:51:00 PM By: Linton Ham MD Signed: 11/26/2019 6:03:05 PM By: Deon Pilling Entered By: Linton Ham on 11/26/2019 08:11:28 -------------------------------------------------------------------------------- Multi-Disciplinary Care Plan Details Patient Name: Date of Service: REED, DADY 11/26/2019 7:30 AM Medical Record [id number] Patient Account Number: [id number] Date of Birth/Sex: Treating  RN: [birth year] (69 y.o. [name] Primary Care Cana Mignano: [name] Other Clinician: Referring Robley Matassa: Treating Carrieanne Kleen/Extender:Robson, Wayland Denis, Clelia Schaumann in Treatment: 5 Active Inactive Nutrition Nursing Diagnoses: Impaired glucose control: actual or potential Potential for alteratiion in Nutrition/Potential for imbalanced nutrition Goals: Patient/caregiver will maintain therapeutic glucose control Date Initiated: 10/18/2019 Target Resolution Date: 12/13/2019 Goal Status: Active Interventions: Assess HgA1c results as ordered upon admission and as needed Assess patient nutrition upon admission and as needed per policy Provide education on elevated blood sugars and impact on wound healing Treatment Activities: Patient referred to Primary Care Physician for further nutritional evaluation : 10/18/2019 Notes: Wound/Skin Impairment Nursing Diagnoses: Impaired tissue integrity Knowledge deficit related to ulceration/compromised skin integrity Goals: Patient/caregiver will verbalize understanding of skin care regimen Date Initiated: 10/18/2019 Target Resolution Date: 12/13/2019 Goal Status: Active Ulcer/skin breakdown will have a volume reduction of 30% by week 4 Date Initiated: 10/18/2019 Target Resolution Date: 12/13/2019 Goal Status: Active Interventions: Assess patient/caregiver ability to obtain necessary supplies Assess patient/caregiver ability to perform ulcer/skin care regimen upon admission and as needed Assess ulceration(s) every visit Provide education on ulcer and skin care Treatment Activities: Skin care regimen initiated : 10/18/2019 Topical wound management initiated : 10/18/2019 Notes: Electronic Signature(s) Signed: 11/26/2019 6:03:05 PM By: Deon Pilling Entered By: Deon Pilling on 11/26/2019 07:45:29 -------------------------------------------------------------------------------- Pain Assessment Details Patient Name: Date of Service: FREEMON, BINFORD 11/26/2019 7:30 AM Medical Record [id number] Patient Account Number: [id number] Date of Birth/Sex: Treating RN: 30-Jun-1951 (69 y.o. [name] Primary Care Glenora Morocho: [name] Other Clinician: Referring Ophia Shamoon: Treating Eren Puebla/Extender:Robson, Wayland Denis, Clelia Schaumann in Treatment: 5 Active Problems Location of Pain Severity and Description of Pain Patient Has Paino No Site Locations Pain Management and Medication Current Pain Management: Electronic Signature(s) Signed: 11/26/2019 6:03:05 PM By: Deon Pilling Signed: 11/27/2019 9:19:04 AM By: Sandre Kitty Entered By: Sandre Kitty on 11/26/2019 07:43:42 -------------------------------------------------------------------------------- Patient/Caregiver Education Details Patient Name: Date of Service: [name] 4/13/2021andnbsp7:30 AM Medical Record [id number] Patient Account Number: [id number] Date of Birth/Gender: Treating RN: September 02, 1950 (69 y.o. [name] Primary Care Physician: [name] Other Clinician: Referring Physician: Treating Physician/Extender:Robson, Wayland Denis, Clelia Schaumann in Treatment: 5 Education Assessment Education Provided To: Patient Education Topics Provided Elevated Blood Sugar/ Impact on Healing: Handouts: Elevated Blood Sugars: How Do They Affect Wound Healing Methods: Explain/Verbal Responses: Reinforcements needed Electronic Signature(s) Signed: 11/26/2019 6:03:05 PM By: Deon Pilling Entered By: Deon Pilling on 11/26/2019 07:45:41 -------------------------------------------------------------------------------- Wound Assessment Details Patient Name: Date of Service: AMEIR, FARIA 11/26/2019 7:30 AM Medical Record [id number] Patient Account Number: [id number] Date of Birth/Sex: Treating RN: 11/15/1950 (69 y.o. [name] Primary Care Sholanda Croson: [name] Other Clinician: Referring Fate Caster: Treating  Kyce Ging/Extender:Robson, Wayland Denis, Clelia Schaumann in Treatment: 5 Wound Status Wound Number: 1  Primary Diabetic Wound/Ulcer of the Lower Extremity Etiology: Wound Location: Left, Lateral Foot Wound Open Wounding Event: Gradually Appeared Status: Date Acquired: 08/15/2018 Comorbid Congestive Heart Failure, Hypertension, Weeks Of Treatment: 5 History: Peripheral Venous Disease, Type II Diabetes Clustered Wound: No Wound Measurements Length: (cm) 0.9 % Reduction in Are Width: (cm) 1 % Reduction in Vol Depth: (cm) 0.2 Epithelialization: Area: (cm) 0.707 Tunneling: Volume: (cm) 0.141 Undermining: Wound Description Classification: Grade 2 Foul Odor After Cl Wound Margin: Flat and Intact Slough/Fibrino Exudate Amount: Small Exudate Type: Serosanguineous Exudate Color: red, brown Wound Bed Granulation Amount: Large (67-100%) E Granulation Quality: Red Fascia Exposed: Necrotic Amount: None Present (0%) Fat Layer (Subcuta Tendon Exposed: Muscle Exposed: Joint Exposed: Bone Exposed: eansing: No No xposed Structure No neous Tissue) Exposed: Yes No No No No a: 75% ume: 90% Small (1-33%) No No Treatment Notes Wound #1 (Left, Lateral Foot) 1. Cleanse With Wound Cleanser Soap and water 2. Periwound Care Barrier cream 3. Primary Dressing Applied Hydrofera Blue 4. Secondary Dressing Dry Gauze Foam 5. Secured With Tape 7. Footwear/Offloading device applied Total Contact Cast Notes foam donut. TCC applied by MD. Electronic Signature(s) Signed: 11/26/2019 5:52:37 PM By: Kela Millin Entered By: Kela Millin on 11/26/2019 07:47:21 -------------------------------------------------------------------------------- Vitals Details Patient Name: Date of Service: [name] 11/26/2019 7:30 AM Medical Record [id number] Patient Account Number: [id number] Date of Birth/Sex: Treating RN: Jan 10, 1951 (69 y.o. [name] Primary Care  Winford Hehn: [name] Other Clinician: Referring Reita Shindler: Treating Baily Serpe/Extender:Robson, Wayland Denis, Clelia Schaumann in Treatment: 5 Vital Signs Time Taken: 07:43 Temperature (F): 98.3 Height (in): 67 Pulse (bpm): 74 Weight (lbs): 173 Respiratory Rate (breaths/min): 18 Body Mass Index (BMI): 27.1 Blood Pressure (mmHg): 187/64 Reference Range: 80 - 120 mg / dl Electronic Signature(s) Signed: 11/27/2019 9:19:04 AM By: Sandre Kitty Entered By: Sandre Kitty on 11/26/2019 07:44:41

## 2019-12-01 NOTE — Progress Notes (Deleted)
Cardiology Office Note:   Date:  12/01/2019  NAME:  [name]    MRN: [id number] DOB:  04/13/1951   PCP:  [name], MD  Cardiologist:  Evalina Field, MD  Electrophysiologist:  None   Referring MD: [name], MD   No chief complaint on file. ***  History of Present Illness:   [name] is a 69 y.o. male with a hx of HFpEF, PAD, CKD3, DM, 1AVB who presents for follow-up of HFpEF.   Problem List 1. HFpEF, 60-65%, G1DD 2. PAD -s/p L anterior tibial angioplasty 05/30/2019 3. Pulmonary sarcoidosis  4. CKD 3 -baseline Cr ~1.9 5. Hypertension  6. Diabetes  -A1c 13.0 7. 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 HLD -T chol 136, HDL 53, LDL 74, TG 47  Past Medical History: Past Medical History:  Diagnosis Date  . Cellulitis and abscess of face 10/11/2007   Qualifier: Diagnosis of  By: Amil Amen MD, Benjamine Mola    . Diabetic retinopathy ([place])   . Diabetic retinopathy associated with type 2 diabetes mellitus ([place]) 09/13/2014   He works for [employer]  . Fluid overload 09/03/2012   Post op  . GERD (gastroesophageal reflux disease)   . Visual impairment     Past Surgical History: Past Surgical History:  Procedure Laterality Date  . ABDOMINAL AORTOGRAM W/LOWER EXTREMITY Left 05/30/2019   Procedure: ABDOMINAL AORTOGRAM W/LOWER EXTREMITY;  Surgeon: Marty Heck, MD;  Location: [facility];  Service: Cardiovascular;  Laterality: Left;  . CATARACT EXTRACTION W/ INTRAOCULAR LENS  IMPLANT, BILATERAL    . EYE SURGERY Bilateral    "laser OR for diabetic retinopathy"  . INGUINAL HERNIA REPAIR     [name] 07/12/2010), "don't remember which side"  . LAPAROTOMY  08/23/2012   Procedure: EXPLORATORY LAPAROTOMY;  Surgeon: [name], DO;  Location: [facility];  Service: General;  Laterality: N/A;  exploratory laparotomy with lysis of adhesions  . LYSIS OF ADHESION  08/23/2012   Procedure: LYSIS OF ADHESION;  Surgeon: [name], DO;   Location: [facility];  Service: General;;  . PERIPHERAL VASCULAR BALLOON ANGIOPLASTY  05/30/2019   Procedure: PERIPHERAL VASCULAR BALLOON ANGIOPLASTY;  Surgeon: Marty Heck, MD;  Location: [facility];  Service: Cardiovascular;;  left anterior tibial  . ROBOT ASSISTED LAPAROSCOPIC RADICAL PROSTATECTOMY  2000's   "had to finish manually after machine broke"  . SHOULDER SURGERY  1970's   separation; from playing football"    Current Medications: No outpatient medications have been marked as taking for the 12/02/19 encounter (Appointment) with O'Neal, Cassie Freer, MD.     Allergies:    Ace inhibitors and Naproxen   Social History: Social History   Socioeconomic History  . Marital status: Married    Spouse name: Not on file  . Number of children: Not on file  . Years of education: Not on file  . Highest education level: Not on file  Occupational History  . Not on file  Tobacco Use  . Smoking status: Former Smoker    Packs/day: 2.50    Years: 3.00    Pack years: 7.50    Types: Cigarettes    Quit date: 11/09/1966    Years since quitting: 53.0  . Smokeless tobacco: Never Used  Substance and Sexual Activity  . Alcohol use: Yes    Comment: 04/08/2015 "maybe a beer/ or 2 or a glass of wine monthly"  . Drug use: No  . Sexual activity:  Yes  Other Topics Concern  . Not on file  Social History Narrative  . Not on file   Social Determinants of Health   Financial Resource Strain:   . Difficulty of Paying Living Expenses:   Food Insecurity:   . Worried About Charity fundraiser in the Last Year:   . Arboriculturist in the Last Year:   Transportation Needs:   . Film/video editor (Medical):   [name] Lack of Transportation (Non-Medical):   Physical Activity:   . Days of Exercise per Week:   . Minutes of Exercise per Session:   Stress:   . Feeling of Stress :   Social Connections:   . Frequency of Communication with Friends and Family:   . Frequency of Social  Gatherings with Friends and Family:   . Attends Religious Services:   . Active Member of Clubs or Organizations:   . Attends Archivist Meetings:   [name] Marital Status:      Family History: The patient's ***family history includes Diabetes Mellitus II in his mother. There is no history of Colon cancer.  ROS:   All other ROS reviewed and negative. Pertinent positives noted in the HPI.     EKGs/Labs/Other Studies Reviewed:   The following studies were personally reviewed by me today:  EKG:  EKG is *** ordered today.  The ekg ordered today demonstrates ***, and was personally reviewed by me.    NM MPI 07/15/2019 - normal  TTE 05/06/2019 1. Left ventricular ejection fraction, by visual estimation, is 60 to  65%. The left ventricle has normal function. Normal left ventricular size.  Mildly increased left ventricular posterior wall thickness. There is no  left ventricular hypertrophy.  2. Left ventricular diastolic Doppler parameters are consistent with  impaired relaxation pattern of LV diastolic filling.  3. Global right ventricle has normal systolic function.The right  ventricular size is normal. No increase in right ventricular wall  thickness.  4. Left atrial size was normal.  5. Right atrial size was normal.  6. The mitral valve is normal in structure. Mild mitral valve  regurgitation. No evidence of mitral stenosis.  7. The tricuspid valve is normal in structure. Tricuspid valve  regurgitation was not visualized by color flow Doppler.  8. The aortic valve is normal in structure. Aortic valve regurgitation is  mild by color flow Doppler. Structurally normal aortic valve, with no  evidence of sclerosis or stenosis.  9. The pulmonic valve was normal in structure. Pulmonic valve  regurgitation is not visualized by color flow Doppler.  10. The inferior vena cava is normal in size with greater than 50%  respiratory variability, suggesting right atrial pressure  of 3 mmHg.   Recent Labs: 05/06/2019: TSH 1.370 05/17/2019: ALT 31 06/25/2019: Magnesium 2.1 06/27/2019: Hemoglobin 8.5; Platelets 233 06/28/2019: B Natriuretic Peptide 306.1 06/29/2019: BUN 25; Creatinine, Ser 2.06; Potassium 4.0; Sodium 137   Recent Lipid Panel    Component Value Date/Time   CHOL 136 05/06/2019 0520   CHOL 158 04/19/2019 1013   TRIG 47 05/06/2019 0520   HDL 53 05/06/2019 0520   HDL 53 04/19/2019 1013   CHOLHDL 2.6 05/06/2019 0520   VLDL 9 05/06/2019 0520   LDLCALC 74 05/06/2019 0520   LDLCALC 87 04/19/2019 1013    Physical Exam:   VS:  There were no vitals taken for this visit.   Wt Readings from Last 3 Encounters:  11/20/19 169 lb 9.6 oz (76.9 kg)  09/25/19  172 lb 3.2 oz (78.1 kg)  08/19/19 167 lb (75.8 kg)    General: Well nourished, well developed, in no acute distress Heart: Atraumatic, normal size  Eyes: PEERLA, EOMI  Neck: Supple, no JVD Endocrine: No thryomegaly Cardiac: Normal S1, S2; RRR; no murmurs, rubs, or gallops Lungs: Clear to auscultation bilaterally, no wheezing, rhonchi or rales  Abd: Soft, nontender, no hepatomegaly  Ext: No edema, pulses 2+ Musculoskeletal: No deformities, BUE and BLE strength normal and equal Skin: Warm and dry, no rashes   Neuro: Alert and oriented to person, place, time, and situation, CNII-XII grossly intact, no focal deficits  Psych: Normal mood and affect   ASSESSMENT:   GRAE CANNATA is a 69 y.o. male who presents for the following: No diagnosis found.  PLAN:   There are no diagnoses linked to this encounter.  Disposition: No follow-ups on file.  Medication Adjustments/Labs and Tests Ordered: Current medicines are reviewed at length with the patient today.  Concerns regarding medicines are outlined above.  No orders of the defined types were placed in this encounter.  No orders of the defined types were placed in this encounter.   There are no Patient Instructions on file for this visit.    Time Spent with Patient: I have spent a total of *** minutes with patient reviewing hospital notes, telemetry, EKGs, labs and examining the patient as well as establishing an assessment and plan that was discussed with the patient.  > 50% of time was spent in direct patient care.  Signed, Addison Naegeli. Audie Box, [place]  [address], [place], [place] [address] [phone]  12/01/2019 4:03 PM

## 2019-12-02 ENCOUNTER — Ambulatory Visit: Payer: Medicare Other | Admitting: Cardiovascular Disease

## 2019-12-02 DIAGNOSIS — I739 Peripheral vascular disease, unspecified: Secondary | ICD-10-CM

## 2019-12-02 DIAGNOSIS — E782 Mixed hyperlipidemia: Secondary | ICD-10-CM

## 2019-12-02 DIAGNOSIS — I5032 Chronic diastolic (congestive) heart failure: Secondary | ICD-10-CM

## 2019-12-02 DIAGNOSIS — I440 Atrioventricular block, first degree: Secondary | ICD-10-CM

## 2019-12-02 DIAGNOSIS — I10 Essential (primary) hypertension: Secondary | ICD-10-CM

## 2019-12-03 ENCOUNTER — Encounter (HOSPITAL_BASED_OUTPATIENT_CLINIC_OR_DEPARTMENT_OTHER): Payer: Medicare Other | Admitting: Internal Medicine

## 2019-12-03 ENCOUNTER — Other Ambulatory Visit: Payer: Self-pay

## 2019-12-03 DIAGNOSIS — E11621 Type 2 diabetes mellitus with foot ulcer: Secondary | ICD-10-CM | POA: Diagnosis not present

## 2019-12-03 NOTE — Progress Notes (Signed)
[name], [name] ([id number]) Visit Report for 12/03/2019 Arrival Information Details Patient Name: Date of Service: [name], [name] 12/03/2019 7:30 AM Medical Record [id number] Patient Account Number: [id number] Date of Birth/Sex: Treating RN: October 12, 1950 (68 y.o. [name]) Carlene Coria Primary Care Jayen Bromwell: [name] Other Clinician: Referring Leeta Grimme: Treating Towanda Hornstein/Extender:Robson, Wayland Denis, Clelia Schaumann in Treatment: 6 Visit Information History Since Last Visit Added or deleted any medications: No Patient Arrived: Ambulatory Any new allergies or adverse reactions: No Arrival Time: 07:45 Had a fall or experienced change in No Accompanied By: self activities of daily living that may affect Transfer Assistance: None risk of falls: Patient Identification Verified: Yes Signs or symptoms of abuse/neglect since No Secondary Verification Process Yes last visito Completed: Hospitalized since last visit: No Patient Requires Transmission- No Implantable device outside of the clinic No Based Precautions: excluding Patient Has Alerts: Yes cellular tissue based products placed in Patient Alerts: Patient on Blood the center Thinner since last visit: Has Dressing in Place as Prescribed: Yes Has Footwear/Offloading in Place as Yes Prescribed: Left: Total Contact Cast Pain Present Now: No Electronic Signature(s) Signed: 12/03/2019 6:13:22 PM By: Baruch Gouty RN, BSN Entered By: Baruch Gouty on 12/03/2019 08:02:39 -------------------------------------------------------------------------------- Encounter Discharge Information Details Patient Name: Date of Service: [name] 12/03/2019 7:30 AM Medical Record [id number] Patient Account Number: [id number] Date of Birth/Sex: Treating RN: 22-Jul-1951 (69 y.o. [name] Primary Care Tytan Sandate: [name] Other Clinician: Referring Jori Thrall: Treating Skyra Crichlow/Extender:Robson,  Wayland Denis, Clelia Schaumann in Treatment: 6 Encounter Discharge Information Items Post Procedure Vitals Discharge Condition: Stable Temperature (F): 98.6 Ambulatory Status: Ambulatory Pulse (bpm): 67 Discharge Destination: Home Respiratory Rate (breaths/min): 18 Transportation: Private Auto Blood Pressure (mmHg): 109/71 Accompanied By: self Schedule Follow-up Appointment: Yes Clinical Summary of Care: Patient Declined Electronic Signature(s) Signed: 12/03/2019 5:57:12 PM By: Kela Millin Entered By: Kela Millin on 12/03/2019 08:51:28 -------------------------------------------------------------------------------- Lower Extremity Assessment Details Patient Name: Date of Service: [name], [name] 12/03/2019 7:30 AM Medical Record [id number] Patient Account Number: [id number] Date of Birth/Sex: Treating RN: July 03, 1951 (69 y.o. [name] Primary Care Lakely Elmendorf: [name] Other Clinician: Referring Hillari Zumwalt: Treating Kynslee Baham/Extender:Robson, Wayland Denis, Clelia Schaumann in Treatment: 6 Edema Assessment Assessed: [Left: No] [Right: No] Edema: [Left: N] [Right: o] Calf Left: Right: Point of Measurement: 41 cm From Medial Instep 31.6 cm cm Ankle Left: Right: Point of Measurement: 9 cm From Medial Instep 23 cm cm Vascular Assessment Pulses: Dorsalis Pedis Palpable: [Left:Yes] Electronic Signature(s) Signed: 12/03/2019 6:13:22 PM By: Baruch Gouty RN, BSN Entered By: Baruch Gouty on 12/03/2019 08:04:26 -------------------------------------------------------------------------------- Multi Wound Chart Details Patient Name: Date of Service: [name] 12/03/2019 7:30 AM Medical Record [id number] Patient Account Number: [id number] Date of Birth/Sex: Treating RN: June 27, 1951 (68 y.o. [name]) Carlene Coria Primary Care Tajay Muzzy: [name] Other Clinician: Referring Zollie Ellery: Treating Tanairi Cypert/Extender:Robson, Wayland Denis,  Clelia Schaumann in Treatment: 6 Vital Signs Height(in): 67 Pulse(bpm): 30 Weight(lbs): 173 Blood Pressure(mmHg): 109/71 Body Mass Index(BMI): 27 Temperature(F): 98.6 Respiratory 18 Rate(breaths/min): Photos: [1:No Photos] [N/A:N/A] Wound Location: [1:Left, Lateral Foot] [N/A:N/A] Wounding Event: [1:Gradually Appeared] [N/A:N/A] Primary Etiology: [1:Diabetic Wound/Ulcer of the N/A Lower Extremity] Comorbid History: [1:Congestive Heart Failure, N/A Hypertension, Peripheral Venous Disease, Type II Diabetes] Date Acquired: [1:08/15/2018] [N/A:N/A] Weeks of Treatment: [1:6] [N/A:N/A] Wound Status: [1:Open] [N/A:N/A] Measurements L x W x D 0.5x0.6x0.2 [N/A:N/A] (cm) Area (cm) : [1:0.236] [N/A:N/A] Volume (cm) : [1:0.047] [N/A:N/A] % Reduction in Area: [1:91.70%] [N/A:N/A] % Reduction in Volume: 96.70% [N/A:N/A] Classification: [1:Grade 2] [N/A:N/A] Exudate Amount: [1:Small] [N/A:N/A] Exudate  Type: [1:Serosanguineous] [N/A:N/A] Exudate Color: [1:red, brown] [N/A:N/A] Wound Margin: [1:Flat and Intact] [N/A:N/A] Granulation Amount: [1:Large (67-100%)] [N/A:N/A] Granulation Quality: [1:Red] [N/A:N/A] Necrotic Amount: [1:None Present (0%)] [N/A:N/A] Exposed Structures: [1:Fat Layer (Subcutaneous N/A Tissue) Exposed: Yes Fascia: No Tendon: No Muscle: No Joint: No Bone: No] Epithelialization: [1:Small (1-33%)] [N/A:N/A] Debridement: [1:Debridement - Selective/Open Wound] [N/A:N/A] Pre-procedure [1:08:30] [N/A:N/A] Verification/Time Out Taken: Pain Control: [1:Lidocaine 5% topical ointment] [N/A:N/A] Tissue Debrided: [1:Slough] [N/A:N/A] Level: [1:Skin/Epidermis] [N/A:N/A] Debridement Area (sq cm):0.3 [N/A:N/A] Instrument: [1:Curette] [N/A:N/A] Bleeding: [1:Minimum] [N/A:N/A] Hemostasis Achieved: [1:Pressure] [N/A:N/A] Procedural Pain: [1:0] [N/A:N/A] Post Procedural Pain: [1:0] [N/A:N/A] Debridement Treatment Procedure was tolerated [N/A:N/A] Response: [1:well] Post Debridement  [1:0.5x0.6x0.2] [N/A:N/A] Measurements L x W x D (cm) Post Debridement [1:0.047] [N/A:N/A] Volume: (cm) Procedures Performed: Debridement [1:Total Contact Cast] [N/A:N/A] Treatment Notes Wound #1 (Left, Lateral Foot) 1. Cleanse With Wound Cleanser Soap and water 3. Primary Dressing Applied Hydrofera Blue 4. Secondary Dressing Dry Gauze Foam 7. Footwear/Offloading device applied Total Contact Cast Notes foam donut. TCC applied by MD. Electronic Signature(s) Signed: 12/03/2019 5:39:56 PM By: Carlene Coria RN Signed: 12/03/2019 5:47:38 PM By: Linton Ham MD Entered By: Linton Ham on 12/03/2019 09:04:14 -------------------------------------------------------------------------------- Multi-Disciplinary Care Plan Details Patient Name: Date of Service: [name], [name] 12/03/2019 7:30 AM Medical Record [id number] Patient Account Number: [id number] Date of Birth/Sex: Treating RN: 08-10-1951 (68 y.o. [name] Primary Care Westley Blass: [name] Other Clinician: Referring Crysta Gulick: Treating Melton Walls/Extender:Robson, Wayland Denis, Clelia Schaumann in Treatment: 6 Active Inactive Nutrition Nursing Diagnoses: Impaired glucose control: actual or potential Potential for alteratiion in Nutrition/Potential for imbalanced nutrition Goals: Patient/caregiver will maintain therapeutic glucose control Date Initiated: 10/18/2019 Target Resolution Date: 12/13/2019 Goal Status: Active Interventions: Assess HgA1c results as ordered upon admission and as needed Assess patient nutrition upon admission and as needed per policy Provide education on elevated blood sugars and impact on wound healing Treatment Activities: Patient referred to Primary Care Physician for further nutritional evaluation : 10/18/2019 Notes: Wound/Skin Impairment Nursing Diagnoses: Impaired tissue integrity Knowledge deficit related to ulceration/compromised skin integrity Goals: Patient/caregiver  will verbalize understanding of skin care regimen Date Initiated: 10/18/2019 Target Resolution Date: 12/13/2019 Goal Status: Active Ulcer/skin breakdown will have a volume reduction of 30% by week 4 Date Initiated: 10/18/2019 Target Resolution Date: 12/13/2019 Goal Status: Active Interventions: Assess patient/caregiver ability to obtain necessary supplies Assess patient/caregiver ability to perform ulcer/skin care regimen upon admission and as needed Assess ulceration(s) every visit Provide education on ulcer and skin care Treatment Activities: Skin care regimen initiated : 10/18/2019 Topical wound management initiated : 10/18/2019 Notes: Electronic Signature(s) Signed: 12/03/2019 5:39:56 PM By: Carlene Coria RN Entered By: Carlene Coria on 12/03/2019 07:54:47 -------------------------------------------------------------------------------- Pain Assessment Details Patient Name: Date of Service: [name], [name] 12/03/2019 7:30 AM Medical Record [id number] Patient Account Number: [id number] Date of Birth/Sex: Treating RN: March 23, 1951 (68 y.o. [name] Primary Care Ana Liaw: [name] Other Clinician: Referring Khari Mally: Treating Chadwick Reiswig/Extender:Robson, Wayland Denis, Clelia Schaumann in Treatment: 6 Active Problems Location of Pain Severity and Description of Pain Patient Has Paino No Site Locations Pain Management and Medication Current Pain Management: Electronic Signature(s) Signed: 12/03/2019 1:29:28 PM By: Sandre Kitty Signed: 12/03/2019 5:39:56 PM By: Carlene Coria RN Entered By: Sandre Kitty on 12/03/2019 07:49:16 -------------------------------------------------------------------------------- Patient/Caregiver Education Details Patient Name: Date of Service: [name] 4/20/2021andnbsp7:30 AM Medical Record [id number] Patient Account Number: [id number] Date of Birth/Gender: Treating RN: 09/24/1950 (68 y.o. [name] Primary Care  Physician: [name] Other Clinician: Referring Physician: Treating Physician/Extender:Robson, Wayland Denis,  Irma Weeks in Treatment: 6 Education Assessment Education Provided To: Patient Education Topics Provided Wound/Skin Impairment: Methods: Explain/Verbal Responses: State content correctly Electronic Signature(s) Signed: 12/03/2019 5:39:56 PM By: Carlene Coria RN Entered By: Carlene Coria on 12/03/2019 07:55:04 -------------------------------------------------------------------------------- Wound Assessment Details Patient Name: Date of Service: [name], [name] 12/03/2019 7:30 AM Medical Record [id number] Patient Account Number: [id number] Date of Birth/Sex: Treating RN: [birth year] (68 y.o. [name]) Carlene Coria Primary Care Ady Heimann: [name] Other Clinician: Referring Braidyn Scorsone: Treating Jabier Deese/Extender:Robson, Wayland Denis, Clelia Schaumann in Treatment: 6 Wound Status Wound Number: 1 Primary Diabetic Wound/Ulcer of the Lower Extremity Etiology: Wound Location: Left, Lateral Foot Wound Open Wounding Event: Gradually Appeared Status: Date Acquired: 08/15/2018 Comorbid Congestive Heart Failure, Hypertension, Weeks Of Treatment: 6 History: Peripheral Venous Disease, Type II Diabetes Clustered Wound: No Wound Measurements Length: (cm) 0.5 % Reducti Width: (cm) 0.6 % Reducti Depth: (cm) 0.2 Epithelia Area: (cm) 0.236 Tunnelin Volume: (cm) 0.047 Undermin Wound Description Classification: Grade 2 Wound Margin: Flat and Intact Exudate Amount: Small Exudate Type: Serosanguineous Exudate Color: red, brown Wound Bed Granulation Amount: Large (67-100%) Granulation Quality: Red Necrotic Amount: None Present (0%) Treatment Notes Wound #1 (Left, Lateral Foot) 1. Cleanse With Wound Cleanser Soap and water 3. Primary Dressing Applied Hydrofera Blue 4. Secondary Dressing Dry Gauze Foam 7. Footwear/Offloading device applied Total Contact Cast Foul  Odor After Cleansing: No Slough/Fibrino No Exposed Structure Fascia Exposed: No Fat Layer (Subcutaneous Tissue) Exposed: Yes Tendon Exposed: No Muscle Exposed: No Joint Exposed: No Bone Exposed: No on in Area: 91.7% on in Volume: 96.7% lization: Small (1-33%) g: No ing: No Notes foam donut. TCC applied by MD. Electronic Signature(s) Signed: 12/03/2019 5:39:56 PM By: Carlene Coria RN Signed: 12/03/2019 6:13:22 PM By: Baruch Gouty RN, BSN Entered By: Baruch Gouty on 12/03/2019 08:04:51 -------------------------------------------------------------------------------- [place] Details Patient Name: Date of Service: [name] 12/03/2019 7:30 AM Medical Record [id number] Patient Account Number: [id number] Date of Birth/Sex: Treating RN: 07-12-1951 (68 y.o. [name] Primary Care Lawayne Hartig: [name] Other Clinician: Referring Aiesha Leland: Treating Quanah Majka/Extender:Robson, Wayland Denis, Clelia Schaumann in Treatment: 6 Vital Signs Time Taken: 07:46 Temperature (F): 98.6 Height (in): 67 Pulse (bpm): 67 Weight (lbs): 173 Respiratory Rate (breaths/min): 18 Body Mass Index (BMI): 27.1 Blood Pressure (mmHg): 109/71 Reference Range: 80 - 120 mg / dl Electronic Signature(s) Signed: 12/03/2019 1:29:28 PM By: Sandre Kitty Entered By: Sandre Kitty on 12/03/2019 07:49:11

## 2019-12-03 NOTE — Progress Notes (Signed)
[place], [place] ([id number]) Visit Report for 12/03/2019 Debridement Details Patient Name: Date of Service: [name], [name] 12/03/2019 7:30 AM Medical Record [id number] Patient Account Number: [id number] Date of Birth/Sex: Treating RN: [birth year] (68 y.o. [name]) Carlene Coria Primary Care Provider: [name] Other Clinician: Referring Provider: Treating Provider/Extender:Bruce Churilla, Wayland Denis, Clelia Schaumann in Treatment: 6 Debridement Performed for Wound #1 Left,Lateral Foot Assessment: Performed By: Physician Ricard Dillon., MD Debridement Type: Debridement Severity of Tissue Pre Fat layer exposed Debridement: Level of Consciousness (Pre- Awake and Alert procedure): Pre-procedure Verification/Time Out Taken: Yes - 08:30 Start Time: 08:30 Pain Control: Lidocaine 5% topical ointment Total Area Debrided (L x W): 0.5 (cm) x 0.6 (cm) = 0.3 (cm) Tissue and other material Viable, Non-Viable, Slough, Skin: Epidermis, Slough debrided: Level: Skin/Epidermis Debridement Description: Selective/Open Wound Instrument: Curette Bleeding: Minimum Hemostasis Achieved: Pressure End Time: 08:33 Procedural Pain: 0 Post Procedural Pain: 0 Response to Treatment: Procedure was tolerated well Level of Consciousness Awake and Alert (Post-procedure): Post Debridement Measurements of Total Wound Length: (cm) 0.5 Width: (cm) 0.6 Depth: (cm) 0.2 Volume: (cm) 0.047 Character of Wound/Ulcer Post Improved Debridement: Severity of Tissue Post Debridement: Fat layer exposed Post Procedure Diagnosis Same as Pre-procedure Electronic Signature(s) Signed: 12/03/2019 5:39:56 PM By: Carlene Coria RN Signed: 12/03/2019 5:47:38 PM By: Linton Ham MD Entered By: Linton Ham on 12/03/2019 09:04:31 -------------------------------------------------------------------------------- HPI Details Patient Name: Date of Service: [name] 12/03/2019 7:30 AM Medical Record  [id number] Patient Account Number: [id number] Date of Birth/Sex: Treating RN: 09-03-1950 (68 y.o. [name] Primary Care Provider: [name] Other Clinician: Referring Provider: Treating Provider/Extender:Whitnee Orzel, Wayland Denis, Clelia Schaumann in Treatment: 6 History of Present Illness HPI Description: ADMISSION 10/18/2019 Patient is a 69 year old man with type 2 diabetes peripheral neuropathy and a Charcot foot. The problem I think began in March 2020 just about a year ago when he was admitted to hospital from 10/19/2018 through 10/24/2018 with a wound on his left lateral foot intense cellulitis. MRI did not suggest osteomyelitis but did note a draining sinus. He apparently received a prolonged course of antibiotics. His hemoglobin A1c at that time was 13.9. He was referred to Dr. [name] where he appears to been followed for probably 5 or 6 months. He was subsequently referred himself to Dr. [name] of podiatry. He has been offloaded in a surgical shoe with a Pegasys sole. He has been mostly using Medihoney and episodic debridement of intense surrounding callus. He has been referred here for further management of this difficult the heel wound area and consideration of hyperbaric oxygen The patient had an angiogram in November. He did not have any proximal stenoses but he did have a 70 to 80% peroneal artery occlusion as well as an 80 to 95% stenosis of the anterior tibia which underwent an angioplasty. Follow-up ABIs were done on 07/02/2019 showed an ABI on the right of 1.26 on the left at 1.28 with TBI's of 0.65 and 0.67 respectively. Waveforms were triphasic Past medical history includes type 2 diabetes with neuropathy, PAD and retinopathy, prostate cancer, gastroesophageal reflux disease, chronic kidney disease, small bowel obstruction 10/25/2019. Patient's x-ray of the left foot from last time showed no evidence of osteomyelitis. Chronic Charcot/neuropathic changes as expected.  Notable soft tissue ulcer involving the plantar aspect underlying the forefoot. The patient actually has 2 wounds. One on the mid forefoot limited to breakdown of skin. The larger area is actually on the lateral foot. We have been using silver alginate to both wound areas. 3/15;  patient came back for the obligatory first total contact cast change. He was apparently walking on the cast without the boot but otherwise tolerated it well and per our intake nurse the wounds look satisfactory. He will be back later in the week for his wound care visit 3/18; patient's wound on the plantar foot looks smaller of lateral foot about the same. Both required debridement. There was some macerated tissue between the 2 wounds which were also removed. We have been using silver alginate. 3/26 1 week follow-up. Total contact cast. Plantar foot wound has closed over. The area on the lateral part of his foot is still open but smaller. Using silver alginate on the wounds 11/15/2019 upon evaluation today patient appears to be doing well with regard to his wound. He is making progress and fortunately the wound is measuring smaller and looking better at this point. Fortunately there is no signs of active infection which is also good news we been using a total contact cast up to this point. 4/9; small wound now on the left lateral foot not technically on the plantar surface although I think he may be somebody who walks partially on the outside of his foot. The more extensive wound on the plantar foot healed 2 weeks ago. The patient tells me that his mother is recently passed away. He wants the total contact cast off Week in preparation for a funeral in [place]. We'll bring him back in on Wednesday to try and accommodate this. We also changed the [facility] might give [place] a quick chance to have a look at this on that date 4/13; patient is here for a total contact cast change. The wound looks quite healthy we  reapplied Hydrofera Blue. He will be back next week for review 4/20; the patient's wound is slightly smaller. The area on the plantar foot remains healed. We have been using Hydrofera Blue Electronic Signature(s) Signed: 12/03/2019 5:47:38 PM By: Linton Ham MD Entered By: Linton Ham on 12/03/2019 09:05:11 -------------------------------------------------------------------------------- Physical Exam Details Patient Name: Date of Service: [name] 12/03/2019 7:30 AM Medical Record [id number] Patient Account Number: [id number] Date of Birth/Sex: Treating RN: Mar 01, 1951 (68 y.o. [name] Primary Care Provider: [name] Other Clinician: Referring Provider: Treating Provider/Extender:Emina Ribaudo, Wayland Denis, Clelia Schaumann in Treatment: 6 Constitutional Sitting or standing Blood Pressure is within target range for patient.. Pulse regular and within target range for patient.[name] Respirations regular, non-labored and within target range.. Temperature is normal and within the target range for the patient.[name] Appears in no distress. Notes Wound exam; the wound surface itself looked healthy. However the edges are raised and somewhat senescent looking. Using a #3 curette I removed callus skin from the edges. This enabled me to get down to a better looking wound circumference. Hopefully this will allow epithelialization there is no evidence of infection Electronic Signature(s) Signed: 12/03/2019 5:47:38 PM By: Linton Ham MD Entered By: Linton Ham on 12/03/2019 09:06:05 -------------------------------------------------------------------------------- Physician Orders Details Patient Name: Date of Service: [name], [name] 12/03/2019 7:30 AM Medical Record [id number] Patient Account Number: [id number] Date of Birth/Sex: Treating RN: [birth year] (68 y.o. [name] Primary Care Provider: [name] Other Clinician: Referring Provider:  Treating Provider/Extender:Dorn Hartshorne, Wayland Denis, Clelia Schaumann in Treatment: 6 Verbal / Phone Orders: No Diagnosis Coding ICD-10 Coding Code Description [phone] Non-pressure chronic ulcer of other part of left foot with fat layer exposed E11.51 Type 2 diabetes mellitus with diabetic peripheral angiopathy without gangrene E11.621 Type 2 diabetes mellitus with foot  ulcer L97.521 Non-pressure chronic ulcer of other part of left foot limited to breakdown of skin M14.672 Charcot's joint, left ankle and foot E11.40 Type 2 diabetes mellitus with diabetic neuropathy, unspecified Follow-up Appointments Return Appointment in 1 week. Dressing Change Frequency Wound #1 Left,Lateral Foot Do not change entire dressing for one week. Skin Barriers/Peri-Wound Care Barrier cream - to macerated periwound area. Wound Cleansing Wound #1 Left,Lateral Foot May shower with protection. - cast protector Primary Wound Dressing Wound #1 Left,Lateral Foot Hydrofera Blue Secondary Dressing Wound #1 Left,Lateral Foot Foam - donut Dry Gauze - tape Other: - cushion area to plantar foot Off-Loading Total Contact Cast to Left [facility] skilled nursing for wound care. - [facility], HOLD for wound care due to cast placed on. Electronic Signature(s) Signed: 12/03/2019 5:39:56 PM By: Carlene Coria RN Signed: 12/03/2019 5:47:38 PM By: Linton Ham MD Entered By: Carlene Coria on 12/03/2019 07:54:37 -------------------------------------------------------------------------------- Problem List Details Patient Name: Date of Service: [name] 12/03/2019 7:30 AM Medical Record [id number] Patient Account Number: [id number] Date of Birth/Sex: Treating RN: [birth year] (68 y.o. [name]) Dolores Lory, Morey Hummingbird Primary Care Provider: [name] Other Clinician: Referring Provider: Treating Provider/Extender:Derrall Hicks, Wayland Denis, Clelia Schaumann in Treatment: 6 Active  Problems ICD-10 Evaluated Encounter Code Description Active Date Today Diagnosis L97.522 Non-pressure chronic ulcer of other part of left foot 10/18/2019 No Yes with fat layer exposed E11.51 Type 2 diabetes mellitus with diabetic peripheral 10/18/2019 No Yes angiopathy without gangrene E11.621 Type 2 diabetes mellitus with foot ulcer 10/18/2019 No Yes L97.521 Non-pressure chronic ulcer of other part of left foot 10/25/2019 No Yes limited to breakdown of skin M14.672 Charcot's joint, left ankle and foot 10/18/2019 No Yes E11.40 Type 2 diabetes mellitus with diabetic neuropathy, 10/18/2019 No Yes unspecified Inactive Problems Resolved Problems Electronic Signature(s) Signed: 12/03/2019 5:47:38 PM By: Linton Ham MD Entered By: Linton Ham on 12/03/2019 09:04:05 -------------------------------------------------------------------------------- Progress Note Details Patient Name: Date of Service: [name] 12/03/2019 7:30 AM Medical Record [id number] Patient Account Number: [id number] Date of Birth/Sex: Treating RN: [birth year] (68 y.o. [name] Primary Care Provider: [name] Other Clinician: Referring Provider: Treating Provider/Extender:Geramy Lamorte, Wayland Denis, Clelia Schaumann in Treatment: 6 Subjective History of Present Illness (HPI) ADMISSION 10/18/2019 Patient is a 69 year old man with type 2 diabetes peripheral neuropathy and a Charcot foot. The problem I think began in March 2020 just about a year ago when he was admitted to hospital from 10/19/2018 through 10/24/2018 with a wound on his left lateral foot intense cellulitis. MRI did not suggest osteomyelitis but did note a draining sinus. He apparently received a prolonged course of antibiotics. His hemoglobin A1c at that time was 13.9. He was referred to Dr. [name] where he appears to been followed for probably 5 or 6 months. He was subsequently referred himself to Dr. [name] of podiatry. He has been offloaded  in a surgical shoe with a Pegasys sole. He has been mostly using Medihoney and episodic debridement of intense surrounding callus. He has been referred here for further management of this difficult the heel wound area and consideration of hyperbaric oxygen The patient had an angiogram in November. He did not have any proximal stenoses but he did have a 70 to 80% peroneal artery occlusion as well as an 80 to 95% stenosis of the anterior tibia which underwent an angioplasty. Follow-up ABIs were done on 07/02/2019 showed an ABI on the right of 1.26 on the left at 1.28 with TBI's of 0.65 and 0.67  respectively. Waveforms were triphasic Past medical history includes type 2 diabetes with neuropathy, PAD and retinopathy, prostate cancer, gastroesophageal reflux disease, chronic kidney disease, small bowel obstruction 10/25/2019. Patient's x-ray of the left foot from last time showed no evidence of osteomyelitis. Chronic Charcot/neuropathic changes as expected. Notable soft tissue ulcer involving the plantar aspect underlying the forefoot. The patient actually has 2 wounds. One on the mid forefoot limited to breakdown of skin. The larger area is actually on the lateral foot. We have been using silver alginate to both wound areas. 3/15; patient came back for the obligatory first total contact cast change. He was apparently walking on the cast without the boot but otherwise tolerated it well and per our intake nurse the wounds look satisfactory. He will be back later in the week for his wound care visit 3/18; patient's wound on the plantar foot looks smaller of lateral foot about the same. Both required debridement. There was some macerated tissue between the 2 wounds which were also removed. We have been using silver alginate. 3/26 1 week follow-up. Total contact cast. Plantar foot wound has closed over. The area on the lateral part of his foot is still open but smaller. Using silver alginate on the  wounds 11/15/2019 upon evaluation today patient appears to be doing well with regard to his wound. He is making progress and fortunately the wound is measuring smaller and looking better at this point. Fortunately there is no signs of active infection which is also good news we been using a total contact cast up to this point. 4/9; small wound now on the left lateral foot not technically on the plantar surface although I think he may be somebody who walks partially on the outside of his foot. The more extensive wound on the plantar foot healed 2 weeks ago. The patient tells me that his mother is recently passed away. He wants the total contact cast off Week in preparation for a funeral in [place]. We'll bring him back in on Wednesday to try and accommodate this. We also changed the [facility] might give [place] a quick chance to have a look at this on that date 4/13; patient is here for a total contact cast change. The wound looks quite healthy we reapplied Hydrofera Blue. He will be back next week for review 4/20; the patient's wound is slightly smaller. The area on the plantar foot remains healed. We have been using Hydrofera Blue Objective Constitutional Sitting or standing Blood Pressure is within target range for patient.. Pulse regular and within target range for patient.[name] Respirations regular, non-labored and within target range.. Temperature is normal and within the target range for the patient.[name] Appears in no distress. Vitals Time Taken: 7:46 AM, Height: 67 in, Weight: 173 lbs, BMI: 27.1, Temperature: 98.6 F, Pulse: 67 bpm, Respiratory Rate: 18 breaths/min, Blood Pressure: 109/71 mmHg. General Notes: Wound exam; the wound surface itself looked healthy. However the edges are raised and somewhat senescent looking. Using a #3 curette I removed callus skin from the edges. This enabled me to get down to a better looking wound circumference. Hopefully this will allow  epithelialization there is no evidence of infection Integumentary (Hair, Skin) Wound #1 status is Open. Original cause of wound was Gradually Appeared. The wound is located on the Left,Lateral Foot. The wound measures 0.5cm length x 0.6cm width x 0.2cm depth; 0.236cm^2 area and 0.047cm^3 volume. There is Fat Layer (Subcutaneous Tissue) Exposed exposed. There is no tunneling or undermining noted. There is  a small amount of serosanguineous drainage noted. The wound margin is flat and intact. There is large (67- 100%) red granulation within the wound bed. There is no necrotic tissue within the wound bed. Assessment Active Problems ICD-10 Non-pressure chronic ulcer of other part of left foot with fat layer exposed Type 2 diabetes mellitus with diabetic peripheral angiopathy without gangrene Type 2 diabetes mellitus with foot ulcer Non-pressure chronic ulcer of other part of left foot limited to breakdown of skin Charcot's joint, left ankle and foot Type 2 diabetes mellitus with diabetic neuropathy, unspecified Procedures Wound #1 Pre-procedure diagnosis of Wound #1 is a Diabetic Wound/Ulcer of the Lower Extremity located on the Left,Lateral Foot .Severity of Tissue Pre Debridement is: Fat layer exposed. There was a Selective/Open Wound Skin/Epidermis Debridement with a total area of 0.3 sq cm performed by Ricard Dillon., MD. With the following instrument(s): Curette to remove Viable and Non-Viable tissue/material. Material removed includes Slough and Skin: Epidermis and after achieving pain control using Lidocaine 5% topical ointment. No specimens were taken. A time out was conducted at 08:30, prior to the start of the procedure. A Minimum amount of bleeding was controlled with Pressure. The procedure was tolerated well with a pain level of 0 throughout and a pain level of 0 following the procedure. Post Debridement Measurements: 0.5cm length x 0.6cm width x 0.2cm depth;  0.047cm^3 volume. Character of Wound/Ulcer Post Debridement is improved. Severity of Tissue Post Debridement is: Fat layer exposed. Post procedure Diagnosis Wound #1: Same as Pre-Procedure Pre-procedure diagnosis of Wound #1 is a Diabetic Wound/Ulcer of the Lower Extremity located on the Left,Lateral Foot . There was a Total Contact Cast Procedure by Ricard Dillon., MD. Post procedure Diagnosis Wound #1: Same as Pre-Procedure Plan Follow-up Appointments: Return Appointment in 1 week. Dressing Change Frequency: Wound #1 Left,Lateral Foot: Do not change entire dressing for one week. Skin Barriers/Peri-Wound Care: Barrier cream - to macerated periwound area. Wound Cleansing: Wound #1 Left,Lateral Foot: May shower with protection. - cast protector Primary Wound Dressing: Wound #1 Left,Lateral Foot: Hydrofera Blue Secondary Dressing: Wound #1 Left,Lateral Foot: Foam - donut Dry Gauze - tape Other: - cushion area to plantar foot Off-Loading: Total Contact Cast to Left Lower Extremity Home Health: [place] [facility] skilled nursing for wound care. - [facility], HOLD for wound care due to cast placed on. #1 continue with Hydrofera Blue. I am hopeful that the debridement of the wound circumference will allow for better epithelialization 2. Puzzling while the wound on the plantar foot healed but this is not. There is no evidence of infection or ischemia. I had thought that this was a weightbearing surface i.e. everts at the ankle that is the reason for continuing the total contact cast Electronic Signature(s) Signed: 12/03/2019 5:47:38 PM By: Linton Ham MD Entered By: Linton Ham on 12/03/2019 09:07:26 -------------------------------------------------------------------------------- Total Contact Cast Details Patient Name: Date of Service: [name], [name] 12/03/2019 7:30 AM Medical Record [id number] Patient Account Number: [id number] Date of Birth/Sex: Treating  RN: [birth year] (68 y.o. [name] Primary Care Provider: [name] Other Clinician: Referring Provider: Treating Provider/Extender:Tekoa Hamor, Wayland Denis, Clelia Schaumann in Treatment: 6 Total Contact Cast Applied for Wound Assessment: Wound #1 Left,Lateral Foot Performed By: Physician Ricard Dillon., MD Post Procedure Diagnosis Same as Pre-procedure Electronic Signature(s) Signed: 12/03/2019 5:47:38 PM By: Linton Ham MD Entered By: Linton Ham on 12/03/2019 09:04:38 -------------------------------------------------------------------------------- SuperBill Details Patient Name: Date of Service: [name] 12/03/2019 Medical Record [id number] Patient Account Number: [id number]  Date of Birth/Sex: Treating RN: [birth year] (68 y.o. [name]) Carlene Coria Primary Care Provider: [name] Other Clinician: Referring Provider: Treating Provider/Extender:Shevawn Langenberg, Wayland Denis, Clelia Schaumann in Treatment: 6 Diagnosis Coding ICD-10 Codes Code Description [phone] Non-pressure chronic ulcer of other part of left foot with fat layer exposed E11.51 Type 2 diabetes mellitus with diabetic peripheral angiopathy without gangrene E11.621 Type 2 diabetes mellitus with foot ulcer L97.521 Non-pressure chronic ulcer of other part of left foot limited to breakdown of skin M14.672 Charcot's joint, left ankle and foot E11.40 Type 2 diabetes mellitus with diabetic neuropathy, unspecified Facility Procedures CPT4 Code Description: [id number] - DEBRIDE WOUND 1ST 20 SQ CM OR < ICD-10 Diagnosis Description L97.522 Non-pressure chronic ulcer of other part of left foot with f Modifier: at layer exp Quantity: 1 osed Physician Procedures CPT4 Code Description: [id number] - WC PHYS DEBR WO ANESTH 20 SQ CM ICD-10 Diagnosis Description L97.522 Non-pressure chronic ulcer of other part of left foot with f Modifier: at layer exp Quantity: 1 osed Electronic Signature(s) Signed:  12/03/2019 5:47:38 PM By: Linton Ham MD Entered By: Linton Ham on 12/03/2019 09:07:43

## 2019-12-10 ENCOUNTER — Other Ambulatory Visit: Payer: Self-pay

## 2019-12-10 ENCOUNTER — Encounter (HOSPITAL_BASED_OUTPATIENT_CLINIC_OR_DEPARTMENT_OTHER): Payer: Medicare Other | Admitting: Internal Medicine

## 2019-12-10 DIAGNOSIS — E11621 Type 2 diabetes mellitus with foot ulcer: Secondary | ICD-10-CM | POA: Diagnosis not present

## 2019-12-10 IMAGING — DX DG CHEST 2V
2 series · 2 of 2 positions shown · non-contrast
Comparison: May 09, 2019

CLINICAL DATA: CHF

EXAM:
CHEST - 2 VIEW

[chest pa]
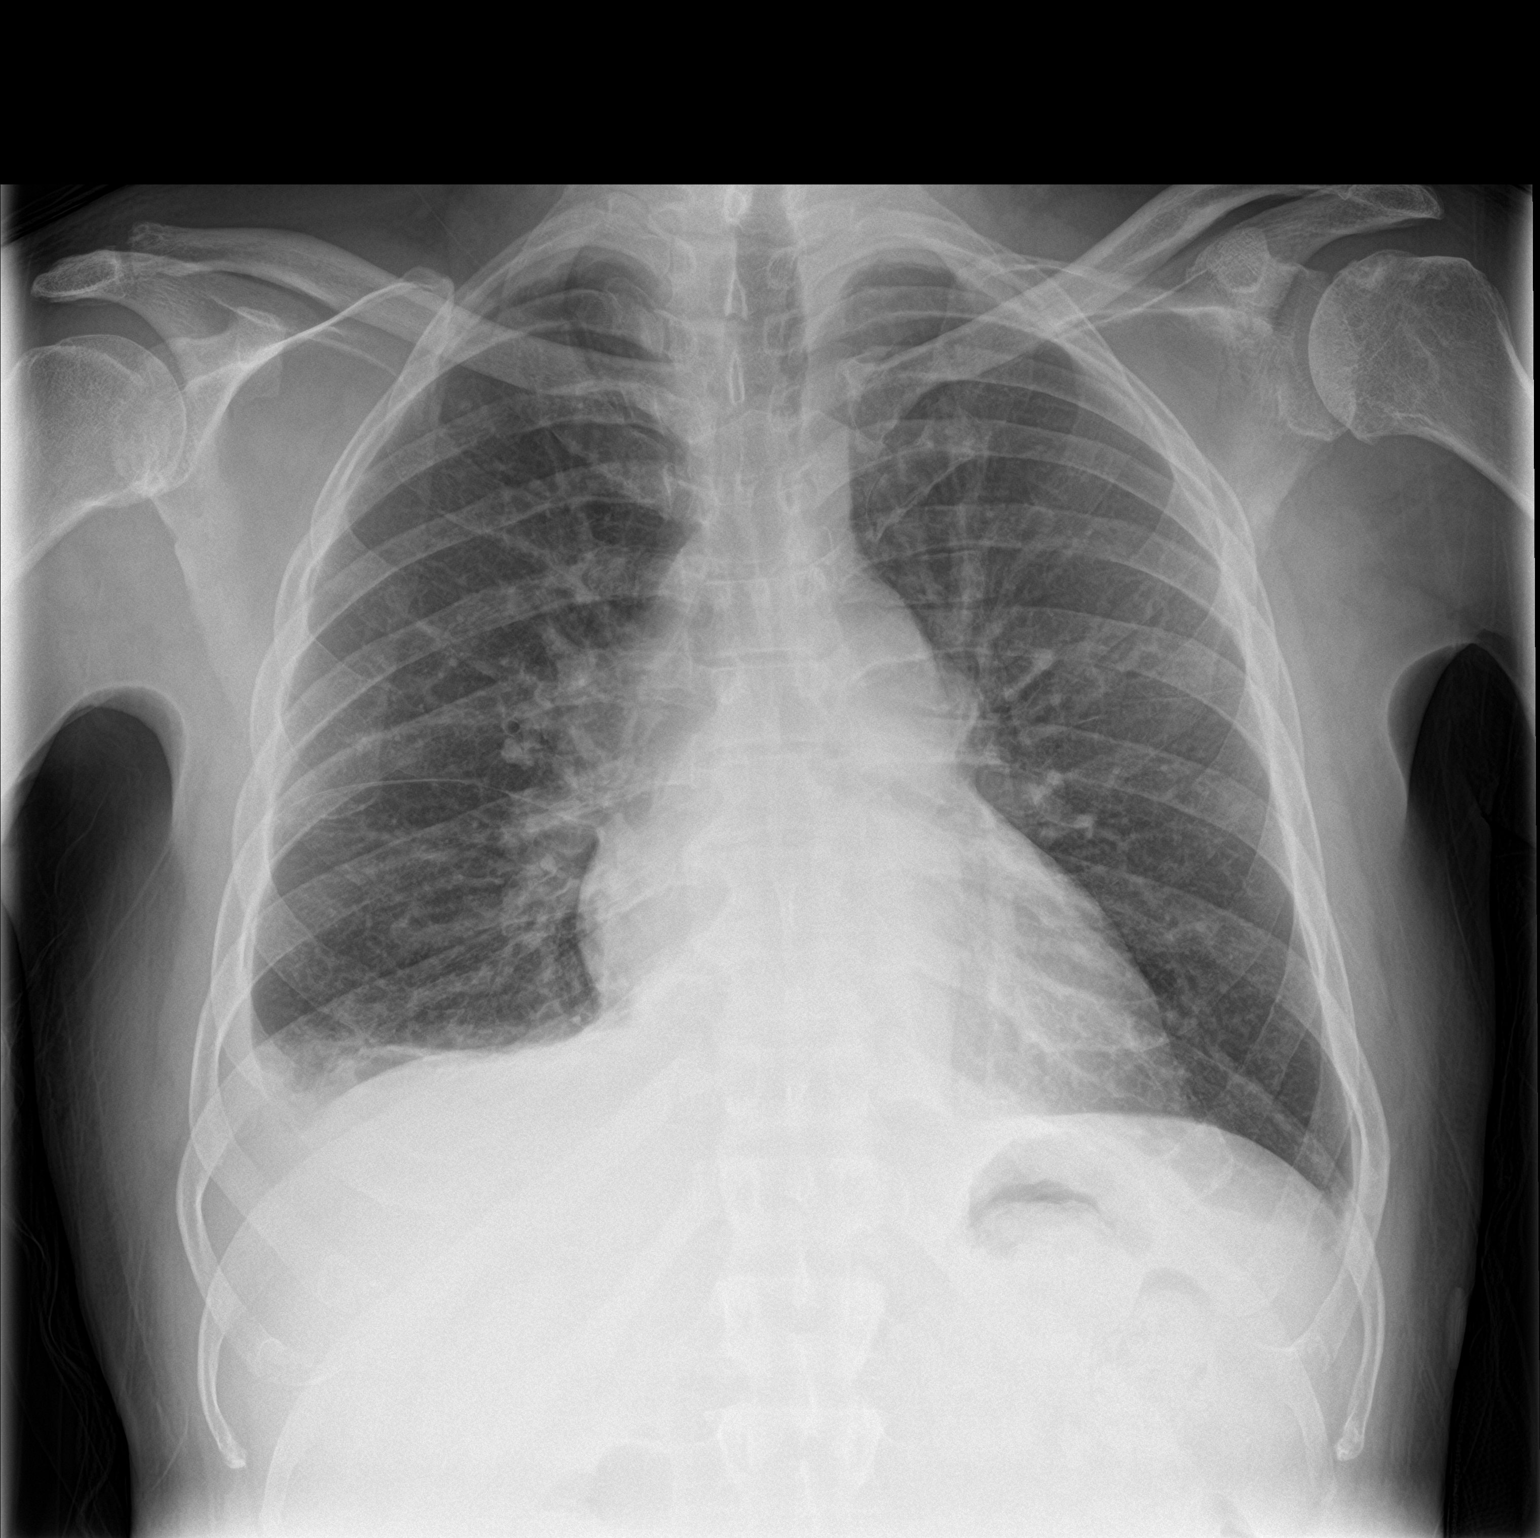

[chest lat]
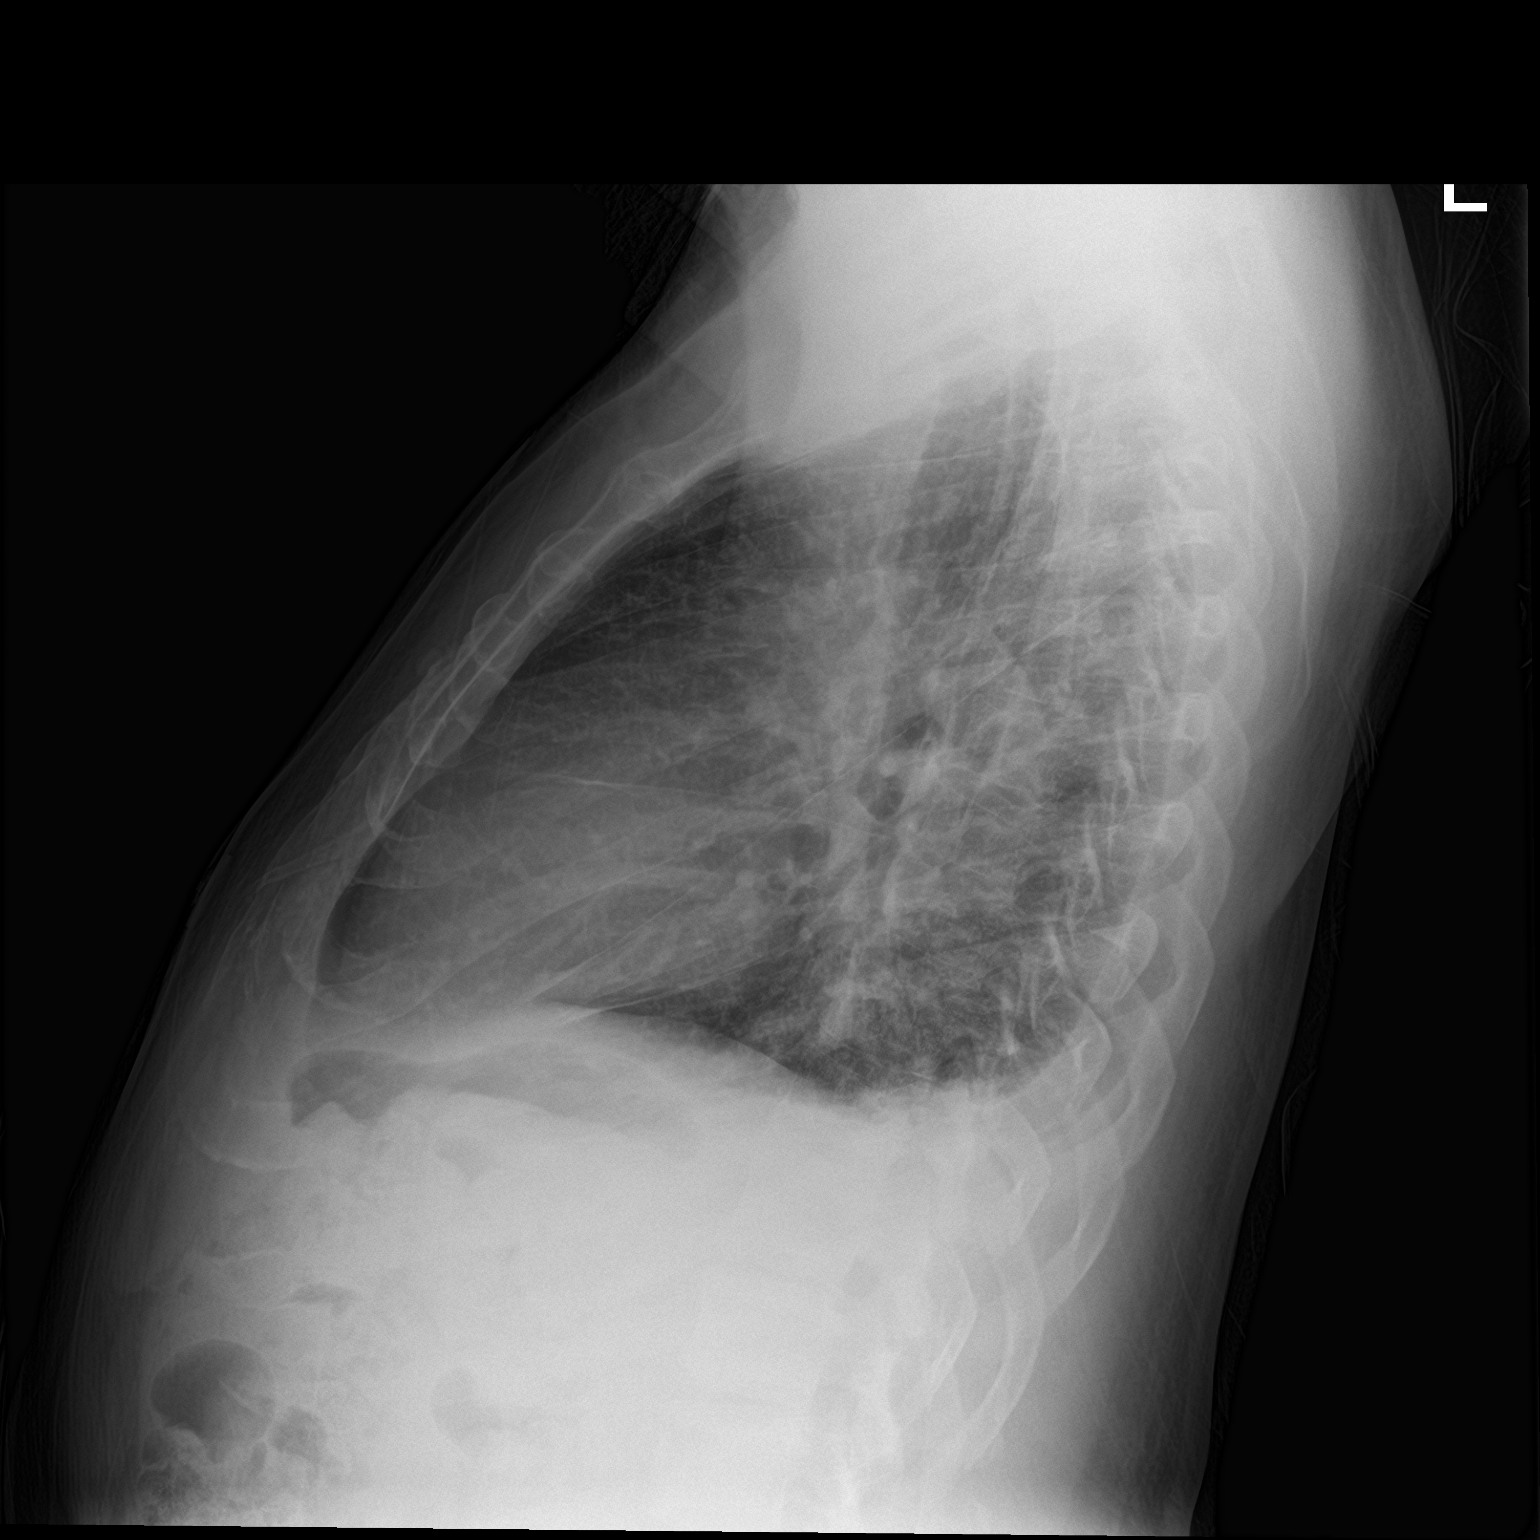

[2 of 2 positions shown; findings below may reference images not displayed]

FINDINGS: Small bilateral pleural effusions, right greater than left.
Associated basilar atelectasis. Normal heart size. No acute osseous
abnormality.
IMPRESSION: Small bilateral pleural effusions and associated basilar
atelectasis.

## 2019-12-10 NOTE — Progress Notes (Signed)
Dority, Fulton ([id number]) Visit Report for 12/10/2019 Arrival Information Details Patient Name: Date of Service: [name], [name] 12/10/2019 7:30 AM Medical Record [id number] Patient Account Number: [id number] Date of Birth/Sex: Treating RN: Jun 24, 1951 (68 y.o. [name]) Carlene Coria Primary Care Ridley Dileo: [name] Other Clinician: Referring Marvon Shillingburg: Treating Seymore Brodowski/Extender:Robson, Wayland Denis, Clelia Schaumann in Treatment: 7 Visit Information History Since Last Visit Added or deleted any medications: No Patient Arrived: Ambulatory Any new allergies or adverse reactions: No Arrival Time: 07:51 Had a fall or experienced change in No Accompanied By: self activities of daily living that may affect Transfer Assistance: None risk of falls: Patient Identification Verified: Yes Signs or symptoms of abuse/neglect since No Secondary Verification Process Yes last visito Completed: Hospitalized since last visit: No Patient Requires Transmission-Based No Implantable device outside of the clinic No Precautions: excluding Patient Has Alerts: Yes cellular tissue based products placed in the Patient Alerts: Patient on Blood center Thinner since last visit: Has Dressing in Place as Prescribed: Yes Has Footwear/Offloading in Place as Yes Prescribed: Left: Total Contact Cast Pain Present Now: No Electronic Signature(s) Signed: 12/10/2019 5:27:53 PM By: Deon Pilling Entered By: Deon Pilling on 12/10/2019 07:53:40 -------------------------------------------------------------------------------- Encounter Discharge Information Details Patient Name: Date of Service: [name] 12/10/2019 7:30 AM Medical Record [id number] Patient Account Number: [id number] Date of Birth/Sex: Treating RN: 03/03/1951 (68 y.o. [name] Primary Care Euan Wandler: [name] Other Clinician: Referring Simrin Vegh: Treating Phiona Ramnauth/Extender:Robson, Wayland Denis,  Clelia Schaumann in Treatment: 7 Encounter Discharge Information Items Discharge Condition: Stable Ambulatory Status: Ambulatory Discharge Destination: Home Transportation: Private Auto Accompanied By: self Schedule Follow-up Appointment: Yes Clinical Summary of Care: Notes rechecked BP 205/70. MD made aware of recheck. Again Advised patient to take BP medication. Electronic Signature(s) Signed: 12/10/2019 5:27:53 PM By: Deon Pilling Entered By: Deon Pilling on 12/10/2019 08:28:46 -------------------------------------------------------------------------------- Lower Extremity Assessment Details Patient Name: Date of Service: [name], [name] 12/10/2019 7:30 AM Medical Record [id number] Patient Account Number: [id number] Date of Birth/Sex: Treating RN: Dec 31, 1950 (68 y.o. [name]) Carlene Coria Primary Care Karysa Heft: [name] Other Clinician: Referring Lemar Bakos: Treating Camry Robello/Extender:Robson, Wayland Denis, Clelia Schaumann in Treatment: 7 Edema Assessment Assessed: [Left: No] [Right: No] Edema: [Left: N] [Right: o] Calf Left: Right: Point of Measurement: 41 cm From Medial Instep 32 cm cm Ankle Left: Right: Point of Measurement: 9 cm From Medial Instep 23 cm cm Vascular Assessment Pulses: Dorsalis Pedis Palpable: [Left:Yes] Electronic Signature(s) Signed: 12/10/2019 5:27:53 PM By: Deon Pilling Signed: 12/10/2019 5:28:23 PM By: Carlene Coria RN Entered By: Deon Pilling on 12/10/2019 07:59:01 -------------------------------------------------------------------------------- Multi Wound Chart Details Patient Name: Date of Service: [name] 12/10/2019 7:30 AM Medical Record [id number] Patient Account Number: [id number] Date of Birth/Sex: Treating RN: 11-20-1950 (68 y.o. [name] Primary Care Nerissa Constantin: [name] Other Clinician: Referring Lyle Niblett: Treating Batya Citron/Extender:Robson, Wayland Denis, Clelia Schaumann in Treatment: 7 Vital  Signs Height(in): 67 Pulse(bpm): 59 Weight(lbs): 173 Blood Pressure(mmHg):211/64 Body Mass Index(BMI): 27 Temperature(F): 98.4 Respiratory 18 Rate(breaths/min): Photos: [1:No Photos] [N/A:N/A] Wound Location: [1:Left, Lateral Foot] [N/A:N/A] Wounding Event: [1:Gradually Appeared] [N/A:N/A] Primary Etiology: [1:Diabetic Wound/Ulcer of the N/A Lower Extremity] Comorbid History: [1:Congestive Heart Failure, N/A Hypertension, Peripheral Venous Disease, Type II Diabetes] Date Acquired: [1:08/15/2018] [N/A:N/A] Weeks of Treatment: [1:7] [N/A:N/A] Wound Status: [1:Open] [N/A:N/A] Measurements L x W x D 0.2x0.4x0.1 [N/A:N/A] (cm) Area (cm) : [1:0.063] [N/A:N/A] Volume (cm) : [1:0.006] [N/A:N/A] % Reduction in Area: [1:97.80%] [N/A:N/A] % Reduction in Volume: 99.60% [N/A:N/A] Classification: [1:Grade 2] [N/A:N/A] Exudate Amount: [1:Small] [N/A:N/A] Exudate Type: [  1:Serosanguineous] [N/A:N/A] Exudate Color: [1:red, brown] [N/A:N/A] Wound Margin: [1:Flat and Intact] [N/A:N/A] Granulation Amount: [1:Large (67-100%)] [N/A:N/A] Granulation Quality: [1:Red] [N/A:N/A] Necrotic Amount: [1:None Present (0%)] [N/A:N/A] Exposed Structures: [1:Fat Layer (Subcutaneous Tissue) Exposed: Yes Fascia: No Tendon: No Muscle: No Joint: No Bone: No Small (1-33%)] [N/A:N/A N/A] Treatment Notes Electronic Signature(s) Signed: 12/10/2019 5:28:23 PM By: Carlene Coria RN Signed: 12/10/2019 5:41:23 PM By: Linton Ham MD Entered By: Linton Ham on 12/10/2019 08:17:44 -------------------------------------------------------------------------------- Multi-Disciplinary Care Plan Details Patient Name: Date of Service: [name] 12/10/2019 7:30 AM Medical Record [id number] Patient Account Number: [id number] Date of Birth/Sex: Treating RN: 1950-08-26 (68 y.o. [name] Primary Care Allex Madia: [name] Other Clinician: Referring Shina Wass: Treating Gali Spinney/Extender:Robson,  Wayland Denis, Clelia Schaumann in Treatment: 7 Active Inactive Nutrition Nursing Diagnoses: Impaired glucose control: actual or potential Potential for alteratiion in Nutrition/Potential for imbalanced nutrition Goals: Patient/caregiver will maintain therapeutic glucose control Date Initiated: 10/18/2019 Target Resolution Date: 12/13/2019 Goal Status: Active Interventions: Assess HgA1c results as ordered upon admission and as needed Assess patient nutrition upon admission and as needed per policy Provide education on elevated blood sugars and impact on wound healing Treatment Activities: Patient referred to Primary Care Physician for further nutritional evaluation : 10/18/2019 Notes: Wound/Skin Impairment Nursing Diagnoses: Impaired tissue integrity Knowledge deficit related to ulceration/compromised skin integrity Goals: Patient/caregiver will verbalize understanding of skin care regimen Date Initiated: 10/18/2019 Target Resolution Date: 12/13/2019 Goal Status: Active Ulcer/skin breakdown will have a volume reduction of 30% by week 4 Date Initiated: 10/18/2019 Target Resolution Date: 12/13/2019 Goal Status: Active Interventions: Assess patient/caregiver ability to obtain necessary supplies Assess patient/caregiver ability to perform ulcer/skin care regimen upon admission and as needed Assess ulceration(s) every visit Provide education on ulcer and skin care Treatment Activities: Skin care regimen initiated : 10/18/2019 Topical wound management initiated : 10/18/2019 Notes: Electronic Signature(s) Signed: 12/10/2019 5:28:23 PM By: Carlene Coria RN Entered By: Carlene Coria on 12/10/2019 07:56:21 -------------------------------------------------------------------------------- Pain Assessment Details Patient Name: Date of Service: [name], [name] 12/10/2019 7:30 AM Medical Record [id number] Patient Account Number: [id number] Date of Birth/Sex: Treating RN: 06-May-1951 (68 y.o. [name] Primary Care Dillon Livermore: [name] Other Clinician: Referring Solan Vosler: Treating Tiernan Suto/Extender:Robson, Wayland Denis, Clelia Schaumann in Treatment: 7 Active Problems Location of Pain Severity and Description of Pain Patient Has Paino No Site Locations Pain Management and Medication Current Pain Management: Electronic Signature(s) Signed: 12/10/2019 5:27:53 PM By: Deon Pilling Signed: 12/10/2019 5:28:23 PM By: Carlene Coria RN Entered By: Deon Pilling on 12/10/2019 07:56:22 -------------------------------------------------------------------------------- Patient/Caregiver Education Details Patient Name: Date of Service: [name] 4/27/2021andnbsp7:30 AM Medical Record [id number] Patient Account Number: [id number] Date of Birth/Gender: 06-Feb-1951 (68 y.o. M) Treating RN: Carlene Coria Primary Care Physician: [name] Other Clinician: Referring Physician: Treating Physician/Extender:Robson, Wayland Denis, Clelia Schaumann in Treatment: 7 Education Assessment Education Provided To: Patient Education Topics Provided Elevated Blood Sugar/ Impact on Healing: Methods: Explain/Verbal Responses: State content correctly Electronic Signature(s) Signed: 12/10/2019 5:28:23 PM By: Carlene Coria RN Entered By: Carlene Coria on 12/10/2019 07:56:42 -------------------------------------------------------------------------------- Wound Assessment Details Patient Name: Date of Service: [name], [name] 12/10/2019 7:30 AM Medical Record [id number] Patient Account Number: [id number] Date of Birth/Sex: Treating RN: 08-26-1950 (68 y.o. [name] Primary Care Larissa Pegg: [name] Other Clinician: Referring Audriana Aldama: Treating Nestor Wieneke/Extender:Robson, Wayland Denis, Clelia Schaumann in Treatment: 7 Wound Status Wound Number: 1 Primary Diabetic Wound/Ulcer of the Lower Extremity Wound Location: Left, Lateral Foot Etiology: Wounding Event: Gradually  Appeared Wound Open Date Acquired: 08/15/2018 Status: [place]  Of Treatment:7 Comorbid Congestive Heart Failure, Hypertension, Clustered Wound: No History: Peripheral Venous Disease, Type II Diabetes Wound Measurements Length: (cm) 0.2 Width: (cm) 0.4 Depth: (cm) 0.1 Area: (cm) 0.063 Volume: (cm) 0.006 Wound Description Classification: Grade 2 Wound Margin: Flat and Intact Exudate Amount: Small Exudate Type: Serosanguineous Exudate Color: red, brown Wound Bed Granulation Amount: Large (67-100%) Granulation Quality: Red Necrotic Amount: None Present (0%) ter Cleansing: No no No Exposed Structure ed: No ubcutaneous Tissue) Exposed: Yes ed: No ed: No d: No : No % Reduction in Area: 97.8% % Reduction in Volume: 99.6% Epithelialization: Small (1-33%) Tunneling: No Undermining: No Foul Odor Af Slough/Fibri Fascia Expos Fat Layer (S Tendon Expos Muscle Expos Joint Expose Bone Exposed Treatment Notes Wound #1 (Left, Lateral Foot) 1. Cleanse With Wound Cleanser Soap and water 2. Periwound Care Barrier cream 3. Primary Dressing Applied Hydrofera Blue 4. Secondary Dressing Dry Gauze Foam 5. Secured With Medipore tape 7. Footwear/Offloading device applied Total Contact Cast Notes foam donut. TCC applied by MD. Electronic Signature(s) Signed: 12/10/2019 5:27:53 PM By: Deon Pilling Signed: 12/10/2019 5:28:23 PM By: Carlene Coria RN Entered By: Deon Pilling on 12/10/2019 08:00:29 -------------------------------------------------------------------------------- Vitals Details Patient Name: Date of Service: [name] 12/10/2019 7:30 AM Medical Record [id number] Patient Account Number: [id number] Date of Birth/Sex: Treating RN: [birth year] (68 y.o. [name]) Carlene Coria Primary Care Jerrine Urschel: [name] Other Clinician: Referring Sky Primo: Treating Mckoy Bhakta/Extender:Robson, Wayland Denis, Clelia Schaumann in Treatment: 7 Vital Signs Time Taken:  07:53 Temperature (F): 98.4 Height (in): 67 Pulse (bpm): 65 Weight (lbs): 173 Respiratory Rate (breaths/min): 18 Body Mass Index (BMI): 27.1 Blood Pressure (mmHg): 211/64 Reference Range: 80 - 120 mg / dl Notes per patient ran out of BP medications related to being out of town. Patient has not taken BP medication. Advised patient to pick up and start BP med/Explained the importance of BP medication and lowering the BP. Patient in agreement. Electronic Signature(s) Signed: 12/10/2019 5:27:53 PM By: Deon Pilling Entered By: Deon Pilling on 12/10/2019 26:83:41

## 2019-12-10 NOTE — Progress Notes (Signed)
[name], [place] ([id number]) Visit Report for 12/10/2019 HPI Details Patient Name: Date of Service: [name], PURDY 12/10/2019 7:30 AM Medical Record [id number] Patient Account Number: [id number] Date of Birth/Sex: Treating RN: [birth year] (69 y.o. [name]) Carlene Coria Primary Care Provider: [name] Other Clinician: Referring Provider: Treating Provider/Extender:Romyn Boswell, Wayland Denis, Clelia Schaumann in Treatment: 7 History of Present Illness HPI Description: ADMISSION 10/18/2019 Patient is a 69 year old man with type 2 diabetes peripheral neuropathy and a Charcot foot. The problem I think began in March 2020 just about a year ago when he was admitted to hospital from 10/19/2018 through 10/24/2018 with a wound on his left lateral foot intense cellulitis. MRI did not suggest osteomyelitis but did note a draining sinus. He apparently received a prolonged course of antibiotics. His hemoglobin A1c at that time was 13.9. He was referred to Dr. [name] where he appears to been followed for probably 5 or 6 months. He was subsequently referred himself to Dr. [name] of podiatry. He has been offloaded in a surgical shoe with a Pegasys sole. He has been mostly using Medihoney and episodic debridement of intense surrounding callus. He has been referred here for further management of this difficult the heel wound area and consideration of hyperbaric oxygen The patient had an angiogram in November. He did not have any proximal stenoses but he did have a 70 to 80% peroneal artery occlusion as well as an 80 to 95% stenosis of the anterior tibia which underwent an angioplasty. Follow-up ABIs were done on 07/02/2019 showed an ABI on the right of 1.26 on the left at 1.28 with TBI's of 0.65 and 0.67 respectively. Waveforms were triphasic Past medical history includes type 2 diabetes with neuropathy, PAD and retinopathy, prostate cancer, gastroesophageal reflux disease, chronic kidney disease, small bowel  obstruction 10/25/2019. Patient's x-ray of the left foot from last time showed no evidence of osteomyelitis. Chronic Charcot/neuropathic changes as expected. Notable soft tissue ulcer involving the plantar aspect underlying the forefoot. The patient actually has 2 wounds. One on the mid forefoot limited to breakdown of skin. The larger area is actually on the lateral foot. We have been using silver alginate to both wound areas. 3/15; patient came back for the obligatory first total contact cast change. He was apparently walking on the cast without the boot but otherwise tolerated it well and per our intake nurse the wounds look satisfactory. He will be back later in the week for his wound care visit 3/18; patient's wound on the plantar foot looks smaller of lateral foot about the same. Both required debridement. There was some macerated tissue between the 2 wounds which were also removed. We have been using silver alginate. 3/26 1 week follow-up. Total contact cast. Plantar foot wound has closed over. The area on the lateral part of his foot is still open but smaller. Using silver alginate on the wounds 11/15/2019 upon evaluation today patient appears to be doing well with regard to his wound. He is making progress and fortunately the wound is measuring smaller and looking better at this point. Fortunately there is no signs of active infection which is also good news we been using a total contact cast up to this point. 4/9; small wound now on the left lateral foot not technically on the plantar surface although I think he may be somebody who walks partially on the outside of his foot. The more extensive wound on the plantar foot healed 2 weeks ago. The patient tells me that his mother is recently  passed away. He wants the total contact cast off Week in preparation for a funeral in [place]. We'll bring him back in on Wednesday to try and accommodate this. We also changed the [facility]  might give [place] a quick chance to have a look at this on that date 4/13; patient is here for a total contact cast change. The wound looks quite healthy we reapplied Hydrofera Blue. He will be back next week for review 4/20; the patient's wound is slightly smaller. The area on the plantar foot remains healed. We have been using Hydrofera Blue 4/27 patient's wound is about half the size. Using Hydrofera Blue the plantar foot remains healed. Blood pressure very high today Electronic Signature(s) Signed: 12/10/2019 5:41:23 PM By: Linton Ham MD Entered By: Linton Ham on 12/10/2019 08:19:37 -------------------------------------------------------------------------------- Physical Exam Details Patient Name: Date of Service: [name] 12/10/2019 7:30 AM Medical Record [id number] Patient Account Number: [id number] Date of Birth/Sex: Treating RN: 20-Jul-1951 (68 y.o. [name] Primary Care Provider: [name] Other Clinician: Referring Provider: Treating Provider/Extender:Jaquala Fuller, Wayland Denis, Clelia Schaumann in Treatment: 7 Constitutional Patient is hypertensive.. Pulse regular and within target range for patient.[name] Respirations regular, non-labored and within target range.. Temperature is normal and within the target range for the patient.[name] Appears in no distress. Notes Wound exam; the wound surface itself looks healthy. Raised edges of thick skin around this however the surface area of the wound is about half the size. No mechanical debridement was done. Surface of the wound looks healthy Electronic Signature(s) Signed: 12/10/2019 5:41:23 PM By: Linton Ham MD Entered By: Linton Ham on 12/10/2019 08:20:20 -------------------------------------------------------------------------------- Physician Orders Details Patient Name: Date of Service: [name] 12/10/2019 7:30 AM Medical Record [id number] Patient Account Number: [id number] Date of  Birth/Sex: Treating RN: [birth year] (69 y.o. [name]) Carlene Coria Primary Care Provider: [name] Other Clinician: Referring Provider: Treating Provider/Extender:Delia Sitar, Wayland Denis, Clelia Schaumann in Treatment: 7 Verbal / Phone Orders: No Diagnosis Coding ICD-10 Coding Code Description [phone] Non-pressure chronic ulcer of other part of left foot with fat layer exposed E11.51 Type 2 diabetes mellitus with diabetic peripheral angiopathy without gangrene E11.621 Type 2 diabetes mellitus with foot ulcer L97.521 Non-pressure chronic ulcer of other part of left foot limited to breakdown of skin M14.672 Charcot's joint, left ankle and foot E11.40 Type 2 diabetes mellitus with diabetic neuropathy, unspecified Follow-up Appointments Return Appointment in 1 week. Dressing Change Frequency Wound #1 Left,Lateral Foot Do not change entire dressing for one week. Skin Barriers/Peri-Wound Care Barrier cream - to macerated periwound area. Wound Cleansing Wound #1 Left,Lateral Foot May shower with protection. - cast protector Primary Wound Dressing Wound #1 Left,Lateral Foot Hydrofera Blue Secondary Dressing Wound #1 Left,Lateral Foot Foam - donut Dry Gauze - tape Other: - cushion area to plantar foot Off-Loading Total Contact Cast to Left [place] skilled [facility] for wound care. - [facility], HOLD for wound care due to cast placed on. Notes instructed patient to take blood pressure medications as ordered , instructed to obtain blood pressure cuff and monitor blood pressure, report abnormal findings to PCP, instructed patient to follow up with PCP ASAP, patient reports he will drop by PCP office when he leaves this office Electronic Signature(s) Signed: 12/10/2019 5:28:23 PM By: Carlene Coria RN Signed: 12/10/2019 5:41:23 PM By: Linton Ham MD Entered By: Carlene Coria on 12/10/2019  08:15:31 -------------------------------------------------------------------------------- Problem List Details Patient Name: Date of Service: [name] 12/10/2019 7:30 AM Medical Record [id number]  Patient Account Number: [id number] Date of Birth/Sex: Treating RN: Oct 21, 1950 (68 y.o. [name]) Carlene Coria Primary Care Provider: [name] Other Clinician: Referring Provider: Treating Provider/Extender:Lenore Moyano, Wayland Denis, Clelia Schaumann in Treatment: 7 Active Problems ICD-10 Encounter Code Description Active Date MDM Diagnosis L97.522 Non-pressure chronic ulcer of other part of left foot with 10/18/2019 No Yes fat layer exposed E11.51 Type 2 diabetes mellitus with diabetic peripheral 10/18/2019 No Yes angiopathy without gangrene E11.621 Type 2 diabetes mellitus with foot ulcer 10/18/2019 No Yes L97.521 Non-pressure chronic ulcer of other part of left foot 10/25/2019 No Yes limited to breakdown of skin M14.672 Charcot's joint, left ankle and foot 10/18/2019 No Yes E11.40 Type 2 diabetes mellitus with diabetic neuropathy, 10/18/2019 No Yes unspecified Inactive Problems Resolved Problems Electronic Signature(s) Signed: 12/10/2019 5:41:23 PM By: Linton Ham MD Entered By: Linton Ham on 12/10/2019 08:17:37 -------------------------------------------------------------------------------- Progress Note Details Patient Name: Date of Service: [name] 12/10/2019 7:30 AM Medical Record [id number] Patient Account Number: [id number] Date of Birth/Sex: Treating RN: 07-02-1951 (68 y.o. [name] Primary Care Provider: [name] Other Clinician: Referring Provider: Treating Provider/Extender:Kewanna Kasprzak, Wayland Denis, Clelia Schaumann in Treatment: 7 Subjective History of Present Illness (HPI) ADMISSION 10/18/2019 Patient is a 69 year old man with type 2 diabetes peripheral neuropathy and a Charcot foot. The problem I think began in March 2020 just about a  year ago when he was admitted to hospital from 10/19/2018 through 10/24/2018 with a wound on his left lateral foot intense cellulitis. MRI did not suggest osteomyelitis but did note a draining sinus. He apparently received a prolonged course of antibiotics. His hemoglobin A1c at that time was 13.9. He was referred to Dr. [name] where he appears to been followed for probably 5 or 6 months. He was subsequently referred himself to Dr. [name] of podiatry. He has been offloaded in a surgical shoe with a Pegasys sole. He has been mostly using Medihoney and episodic debridement of intense surrounding callus. He has been referred here for further management of this difficult the heel wound area and consideration of hyperbaric oxygen The patient had an angiogram in November. He did not have any proximal stenoses but he did have a 70 to 80% peroneal artery occlusion as well as an 80 to 95% stenosis of the anterior tibia which underwent an angioplasty. Follow-up ABIs were done on 07/02/2019 showed an ABI on the right of 1.26 on the left at 1.28 with TBI's of 0.65 and 0.67 respectively. Waveforms were triphasic Past medical history includes type 2 diabetes with neuropathy, PAD and retinopathy, prostate cancer, gastroesophageal reflux disease, chronic kidney disease, small bowel obstruction 10/25/2019. Patient's x-ray of the left foot from last time showed no evidence of osteomyelitis. Chronic Charcot/neuropathic changes as expected. Notable soft tissue ulcer involving the plantar aspect underlying the forefoot. The patient actually has 2 wounds. One on the mid forefoot limited to breakdown of skin. The larger area is actually on the lateral foot. We have been using silver alginate to both wound areas. 3/15; patient came back for the obligatory first total contact cast change. He was apparently walking on the cast without the boot but otherwise tolerated it well and per our intake nurse the wounds look satisfactory.  He will be back later in the week for his wound care visit 3/18; patient's wound on the plantar foot looks smaller of lateral foot about the same. Both required debridement. There was some macerated tissue between the 2 wounds which were also removed. We have been using silver alginate.  3/26 1 week follow-up. Total contact cast. Plantar foot wound has closed over. The area on the lateral part of his foot is still open but smaller. Using silver alginate on the wounds 11/15/2019 upon evaluation today patient appears to be doing well with regard to his wound. He is making progress and fortunately the wound is measuring smaller and looking better at this point. Fortunately there is no signs of active infection which is also good news we been using a total contact cast up to this point. 4/9; small wound now on the left lateral foot not technically on the plantar surface although I think he may be somebody who walks partially on the outside of his foot. The more extensive wound on the plantar foot healed 2 weeks ago. The patient tells me that his mother is recently passed away. He wants the total contact cast off Week in preparation for a funeral in [place]. We'll bring him back in on Wednesday to try and accommodate this. We also changed the [facility] might give [place] a quick chance to have a look at this on that date 4/13; patient is here for a total contact cast change. The wound looks quite healthy we reapplied Hydrofera Blue. He will be back next week for review 4/20; the patient's wound is slightly smaller. The area on the plantar foot remains healed. We have been using Hydrofera Blue 4/27 patient's wound is about half the size. Using Hydrofera Blue the plantar foot remains healed. Blood pressure very high today Objective Constitutional Patient is hypertensive.. Pulse regular and within target range for patient.[name] Respirations regular, non-labored and within target range..  Temperature is normal and within the target range for the patient.[name] Appears in no distress. Vitals Time Taken: 7:53 AM, Height: 67 in, Weight: 173 lbs, BMI: 27.1, Temperature: 98.4 F, Pulse: 65 bpm, Respiratory Rate: 18 breaths/min, Blood Pressure: 211/64 mmHg. General Notes: Wound exam; the wound surface itself looks healthy. Raised edges of thick skin around this however the surface area of the wound is about half the size. No mechanical debridement was done. Surface of the wound looks healthy Integumentary (Hair, Skin) Wound #1 status is Open. Original cause of wound was Gradually Appeared. The wound is located on the Left,Lateral Foot. The wound measures 0.2cm length x 0.4cm width x 0.1cm depth; 0.063cm^2 area and 0.006cm^3 volume. There is Fat Layer (Subcutaneous Tissue) Exposed exposed. There is no tunneling or undermining noted. There is a small amount of serosanguineous drainage noted. The wound margin is flat and intact. There is large (67- 100%) red granulation within the wound bed. There is no necrotic tissue within the wound bed. Assessment Active Problems ICD-10 Non-pressure chronic ulcer of other part of left foot with fat layer exposed Type 2 diabetes mellitus with diabetic peripheral angiopathy without gangrene Type 2 diabetes mellitus with foot ulcer Non-pressure chronic ulcer of other part of left foot limited to breakdown of skin Charcot's joint, left ankle and foot Type 2 diabetes mellitus with diabetic neuropathy, unspecified Plan Follow-up Appointments: Return Appointment in 1 week. Dressing Change Frequency: Wound #1 Left,Lateral Foot: Do not change entire dressing for one week. Skin Barriers/Peri-Wound Care: Barrier cream - to macerated periwound area. Wound Cleansing: Wound #1 Left,Lateral Foot: May shower with protection. - cast protector Primary Wound Dressing: Wound #1 Left,Lateral Foot: Hydrofera Blue Secondary Dressing: Wound #1 Left,Lateral  Foot: Foam - donut Dry Gauze - tape Other: - cushion area to plantar foot Off-Loading: Total Contact Cast to Left Lower Extremity  Home Health: [facility] skilled [facility] for wound care. - [facility], HOLD for wound care due to cast placed on. General Notes: instructed patient to take blood pressure medications as ordered , instructed to obtain blood pressure cuff and monitor blood pressure, report abnormal findings to PCP, instructed patient to follow up with PCP ASAP, patient reports he will drop by PCP office when he leaves this office 1. I am continue with Hydrofera Blue under total contact cast 2. This should be healed by next week 3. He says he has specialized shoes that he bought at [employer] foot. I have asked him to bring these in next week 4. I have spoken to him about his high blood pressure asked him to make a appointment with his primary doctor. He is not taking his prescribed antihypertensive. I am really not sure what the issue is Electronic Signature(s) Signed: 12/10/2019 5:41:23 PM By: Linton Ham MD Entered By: Linton Ham on 12/10/2019 08:22:00 -------------------------------------------------------------------------------- SuperBill Details Patient Name: Date of Service: [name] 12/10/2019 Medical Record [id number] Patient Account Number: [id number] Date of Birth/Sex: Treating RN: 06/03/1951 (68 y.o. [name] Primary Care Provider: [name] Other Clinician: Referring Provider: Treating Provider/Extender:Lovette Merta, Wayland Denis, Clelia Schaumann in Treatment: 7 Diagnosis Coding ICD-10 Codes Code Description [phone] Non-pressure chronic ulcer of other part of left foot with fat layer exposed E11.51 Type 2 diabetes mellitus with diabetic peripheral angiopathy without gangrene E11.621 Type 2 diabetes mellitus with foot ulcer L97.521 Non-pressure chronic ulcer of other part of left foot limited to breakdown of skin M14.672  Charcot's joint, left ankle and foot E11.40 Type 2 diabetes mellitus with diabetic neuropathy, unspecified Facility Procedures CPT4 Code: [id number] Description: 29445 - APPLY TOTAL CONTACT LEG CAST ICD-10 Diagnosis Description E11.621 Type 2 diabetes mellitus with foot ulcer Modifier: Quantity: 1 Physician Procedures CPT4 Code: [id number] Description: 88757 - WC PHYS APPLY TOTAL CONTACT CAST ICD-10 Diagnosis Description E11.621 Type 2 diabetes mellitus with foot ulcer Modifier: Quantity: 1 Electronic Signature(s) Signed: 12/10/2019 5:41:23 PM By: Linton Ham MD Entered By: Linton Ham on 12/10/2019 08:43:27

## 2019-12-17 ENCOUNTER — Encounter (HOSPITAL_BASED_OUTPATIENT_CLINIC_OR_DEPARTMENT_OTHER): Payer: Medicare Other | Attending: Internal Medicine | Admitting: Internal Medicine

## 2019-12-17 DIAGNOSIS — M14672 Charcot's joint, left ankle and foot: Secondary | ICD-10-CM | POA: Insufficient documentation

## 2019-12-17 DIAGNOSIS — L97521 Non-pressure chronic ulcer of other part of left foot limited to breakdown of skin: Secondary | ICD-10-CM | POA: Insufficient documentation

## 2019-12-17 DIAGNOSIS — N189 Chronic kidney disease, unspecified: Secondary | ICD-10-CM | POA: Diagnosis not present

## 2019-12-17 DIAGNOSIS — E1122 Type 2 diabetes mellitus with diabetic chronic kidney disease: Secondary | ICD-10-CM | POA: Diagnosis not present

## 2019-12-17 DIAGNOSIS — E11621 Type 2 diabetes mellitus with foot ulcer: Secondary | ICD-10-CM | POA: Insufficient documentation

## 2019-12-17 DIAGNOSIS — Z8546 Personal history of malignant neoplasm of prostate: Secondary | ICD-10-CM | POA: Diagnosis not present

## 2019-12-17 DIAGNOSIS — K219 Gastro-esophageal reflux disease without esophagitis: Secondary | ICD-10-CM | POA: Insufficient documentation

## 2019-12-17 DIAGNOSIS — E1151 Type 2 diabetes mellitus with diabetic peripheral angiopathy without gangrene: Secondary | ICD-10-CM | POA: Diagnosis not present

## 2019-12-17 DIAGNOSIS — E1140 Type 2 diabetes mellitus with diabetic neuropathy, unspecified: Secondary | ICD-10-CM | POA: Insufficient documentation

## 2019-12-17 DIAGNOSIS — E1142 Type 2 diabetes mellitus with diabetic polyneuropathy: Secondary | ICD-10-CM | POA: Diagnosis not present

## 2019-12-17 DIAGNOSIS — L97522 Non-pressure chronic ulcer of other part of left foot with fat layer exposed: Secondary | ICD-10-CM | POA: Diagnosis present

## 2019-12-18 NOTE — Progress Notes (Signed)
[name], [name] ([id number]) Visit Report for 12/17/2019 HPI Details Patient Name: Date of Service: [name] [facility] [place] L. 12/17/2019 7:30 A M Medical Record Number: [id number] Patient Account Number: [id number] Date of Birth/Sex: Treating RN: 03-21-1951 (69 y.o. [name]) Carlene Coria Primary Care Provider: [name] Other Clinician: Referring Provider: Treating Provider/Extender: [name]: 8 History of Present Illness HPI Description: ADMISSION 10/18/2019 Patient is a 69 year old man with type 2 diabetes peripheral neuropathy and a Charcot foot. The problem I think began in March 2020 just about a year ago when he was admitted to hospital from 10/19/2018 through 10/24/2018 with a wound on his left lateral foot intense cellulitis. MRI did not suggest osteomyelitis but did note a draining sinus. He apparently received a prolonged course of antibiotics. His hemoglobin A1c at that time was 13.9. He was referred to Dr. [name] where he appears to been followed for probably 5 or 6 months. He was subsequently referred himself to Dr. [name] of podiatry. He has been offloaded in a surgical shoe with a Pegasys sole. He has been mostly using Medihoney and episodic debridement of intense surrounding callus. He has been referred here for further management of this difficult the heel wound area and consideration of hyperbaric oxygen The patient had an angiogram in November. He did not have any proximal stenoses but he did have a 70 to 80% peroneal artery occlusion as well as an 80 to 95% stenosis of the anterior tibia which underwent an angioplasty. Follow-up ABIs were done on 07/02/2019 showed an ABI on the right of 1.26 on the left at 1.28 with TBI's of 0.65 and 0.67 respectively. Waveforms were triphasic Past medical history includes type 2 diabetes with neuropathy, PAD and retinopathy, prostate cancer, gastroesophageal reflux disease, chronic kidney disease, small bowel  obstruction 10/25/2019. Patient's x-ray of the left foot from last time showed no evidence of osteomyelitis. Chronic Charcot/neuropathic changes as expected. Notable soft tissue ulcer involving the plantar aspect underlying the forefoot. The patient actually has 2 wounds. One on the mid forefoot limited to breakdown of skin. The larger area is actually on the lateral foot. We have been using silver alginate to both wound areas. 3/15; patient came back for the obligatory first total contact cast change. He was apparently walking on the cast without the boot but otherwise tolerated it well and per our intake nurse the wounds look satisfactory. He will be back later in the week for his wound care visit 3/18; patient's wound on the plantar foot looks smaller of lateral foot about the same. Both required debridement. There was some macerated tissue between the 2 wounds which were also removed. We have been using silver alginate. 3/26 1 week follow-up. T contact cast. Plantar foot wound has closed over. The area on the lateral part of his foot is still open but smaller. Using silver otal alginate on the wounds 11/15/2019 upon evaluation today patient appears to be doing well with regard to his wound. He is making progress and fortunately the wound is measuring smaller and looking better at this point. Fortunately there is no signs of active infection which is also good news we been using a total contact cast up to this point. 4/9; small wound now on the left lateral foot not technically on the plantar surface although I think he may be somebody who walks partially on the outside of his foot. The more extensive wound on the plantar foot healed 2 weeks ago. The patient  tells me that his mother is recently passed away. He wants the total contact cast off Week in preparation for a funeral in [place]. We'll bring him back in on Wednesday to try and accommodate this. We also changed the [facility]  might give [place] a quick chance to have a look at this on that date 4/13; patient is here for a total contact cast change. The wound looks quite healthy we reapplied Hydrofera Blue. He will be back next week for review 4/20; the patient's wound is slightly smaller. The area on the plantar foot remains healed. We have been using Hydrofera Blue 4/27 patient's wound is about half the size. Using Hydrofera Blue the plantar foot remains healed. Blood pressure very high today 5/4; the patient's wound is totally healed. Type 2 diabetes with a Charcot foot. Healed out in a total contact cast. He has a diabetic shoe with custom inserts that he got at [facility]. 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 Electronic Signature(s) Signed: 12/17/2019 5:25:34 PM By: Linton Ham MD Entered By: Linton Ham on 12/17/2019 08:23:17 -------------------------------------------------------------------------------- Physical Exam Details Patient Name: Date of Service: [name] ND L. 12/17/2019 7:30 A M Medical Record Number: [id number] Patient Account Number: [id number] Date of Birth/Sex: Treating RN: 1950-08-23 (69 y.o. [name] Primary Care Provider: [name] Other Clinician: Referring Provider: Treating Provider/Extender: [name]: 8 Notes Wound exam; the surface of the wound itself looks healthy. Fully epithelialized. This is healed with skin over bone in some places. There is not any subcutaneous fibrotic tissue. Nevertheless the entire area looks healthy. Electronic Signature(s) Signed: 12/17/2019 5:25:34 PM By: Linton Ham MD Entered By: Linton Ham on 12/17/2019 08:23:55 -------------------------------------------------------------------------------- Physician Orders  Details Patient Name: Date of Service: [name] ND L. 12/17/2019 7:30 A M Medical Record Number: [id number] Patient Account Number: [id number] Date of Birth/Sex: Treating RN: May 26, 1951 (69 y.o. [name]) Carlene Coria Primary Care Provider: [name] Other Clinician: Referring Provider: Treating Provider/Extender: [name]: 8 Verbal / Phone Orders: No Diagnosis Coding ICD-10 Coding Code Description [phone] Non-pressure chronic ulcer of other part of left foot with fat layer exposed E11.51 Type 2 diabetes mellitus with diabetic peripheral angiopathy without gangrene E11.621 Type 2 diabetes mellitus with foot ulcer L97.521 Non-pressure chronic ulcer of other part of left foot limited to breakdown of skin M14.672 Charcot's joint, left ankle and foot E11.40 Type 2 diabetes mellitus with diabetic neuropathy, unspecified Discharge From [facility] Services Discharge from [place] - patient to pad healed area daily , patient to wear shoes at all times, DO NOT go bare footed. Electronic Signature(s) Signed: 12/17/2019 5:25:34 PM By: Linton Ham MD Signed: 12/18/2019 4:37:47 PM By: Carlene Coria RN Entered By: Carlene Coria on 12/17/2019 08:14:16 -------------------------------------------------------------------------------- Problem List Details Patient Name: Date of Service: [name] ND L. 12/17/2019 7:30 A M Medical Record Number: [id number] Patient Account Number: [id number] Date of Birth/Sex: Treating RN: [birth year] (68 y.o. [name] Primary Care Provider: [name] Other Clinician: Referring Provider: Treating Provider/Extender: [name]: 8 Active Problems ICD-10 Encounter Code Description Active Date MDM Diagnosis L97.522 Non-pressure chronic ulcer of other part of left foot with fat layer  exposed 10/18/2019 No Yes E11.51 Type 2 diabetes mellitus with diabetic peripheral  angiopathy without gangrene 10/18/2019 No Yes E11.621 Type 2 diabetes mellitus with foot ulcer 10/18/2019 No Yes L97.521 Non-pressure chronic ulcer of other part of left foot limited to breakdown of 10/25/2019 No Yes skin M14.672 Charcot's joint, left ankle and foot 10/18/2019 No Yes E11.40 Type 2 diabetes mellitus with diabetic neuropathy, unspecified 10/18/2019 No Yes Inactive Problems Resolved Problems Electronic Signature(s) Signed: 12/17/2019 5:25:34 PM By: Linton Ham MD Entered By: Linton Ham on 12/17/2019 08:22:14 -------------------------------------------------------------------------------- Progress Note Details Patient Name: Date of Service: [name] ND L. 12/17/2019 7:30 A M Medical Record Number: [id number] Patient Account Number: [id number] Date of Birth/Sex: Treating RN: September 08, 1950 (68 y.o. [name] Primary Care Provider: [name] Other Clinician: Referring Provider: Treating Provider/Extender: Aundra Dubin, Clelia Schaumann in Treatment: 8 Subjective History of Present Illness (HPI) ADMISSION 10/18/2019 Patient is a 68 year old man with type 2 diabetes peripheral neuropathy and a Charcot foot. The problem I think began in March 2020 just about a year ago when he was admitted to hospital from 10/19/2018 through 10/24/2018 with a wound on his left lateral foot intense cellulitis. MRI did not suggest osteomyelitis but did note a draining sinus. He apparently received a prolonged course of antibiotics. His hemoglobin A1c at that time was 13.9. He was referred to Dr. [name] where he appears to been followed for probably 5 or 6 months. He was subsequently referred himself to Dr. [name] of podiatry. He has been offloaded in a surgical shoe with a Pegasys sole. He has been mostly using Medihoney and episodic debridement of intense surrounding callus. He has been referred here for further management of this difficult the heel wound area and consideration of  hyperbaric oxygen The patient had an angiogram in November. He did not have any proximal stenoses but he did have a 70 to 80% peroneal artery occlusion as well as an 80 to 95% stenosis of the anterior tibia which underwent an angioplasty. Follow-up ABIs were done on 07/02/2019 showed an ABI on the right of 1.26 on the left at 1.28 with TBI's of 0.65 and 0.67 respectively. Waveforms were triphasic Past medical history includes type 2 diabetes with neuropathy, PAD and retinopathy, prostate cancer, gastroesophageal reflux disease, chronic kidney disease, small bowel obstruction 10/25/2019. Patient's x-ray of the left foot from last time showed no evidence of osteomyelitis. Chronic Charcot/neuropathic changes as expected. Notable soft tissue ulcer involving the plantar aspect underlying the forefoot. The patient actually has 2 wounds. One on the mid forefoot limited to breakdown of skin. The larger area is actually on the lateral foot. We have been using silver alginate to both wound areas. 3/15; patient came back for the obligatory first total contact cast change. He was apparently walking on the cast without the boot but otherwise tolerated it well and per our intake nurse the wounds look satisfactory. He will be back later in the week for his wound care visit 3/18; patient's wound on the plantar foot looks smaller of lateral foot about the same. Both required debridement. There was some macerated tissue between the 2 wounds which were also removed. We have been using silver alginate. 3/26 1 week follow-up. T contact cast. Plantar foot wound has closed over. The area on the lateral part of his foot is still open but smaller. Using silver otal alginate on the wounds 11/15/2019 upon evaluation today patient appears to be doing well with regard to his wound. He  is making progress and fortunately the wound is measuring smaller and looking better at this point. Fortunately there is no signs of active  infection which is also good news we been using a total contact cast up to this point. 4/9; small wound now on the left lateral foot not technically on the plantar surface although I think he may be somebody who walks partially on the outside of his foot. The more extensive wound on the plantar foot healed 2 weeks ago. The patient tells me that his mother is recently passed away. He wants the total contact cast off Week in preparation for a funeral in [place]. We'll bring him back in on Wednesday to try and accommodate this. We also changed the [facility] might give [place] a quick chance to have a look at this on that date 4/13; patient is here for a total contact cast change. The wound looks quite healthy we reapplied Hydrofera Blue. He will be back next week for review 4/20; the patient's wound is slightly smaller. The area on the plantar foot remains healed. We have been using Hydrofera Blue 4/27 patient's wound is about half the size. Using Hydrofera Blue the plantar foot remains healed. Blood pressure very high today 5/4; the patient's wound is totally healed. Type 2 diabetes with a Charcot foot. Healed out in a total contact cast. He has a diabetic shoe with custom inserts that he got at [facility]. 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 Objective Constitutional Vitals Time Taken: 7:49 AM, Height: 67 in, Weight: 173 lbs, BMI: 27.1, Temperature: 98.6 F, Pulse: 63 bpm, Respiratory Rate: 18 breaths/min, Blood Pressure: 190/70 mmHg. Integumentary (Hair, Skin) Wound #1 status is Healed - Epithelialized. Original cause of wound was Gradually Appeared. The wound is located on the Left,Lateral Foot. The wound measures 0cm length x 0cm width x 0cm depth; 0cm^2 area and 0cm^3 volume. There is no tunneling or undermining noted.  There is a none present amount of drainage noted. The wound margin is flat and intact. There is no granulation within the wound bed. There is no necrotic tissue within the wound bed. Assessment Active Problems ICD-10 Non-pressure chronic ulcer of other part of left foot with fat layer exposed Type 2 diabetes mellitus with diabetic peripheral angiopathy without gangrene Type 2 diabetes mellitus with foot ulcer Non-pressure chronic ulcer of other part of left foot limited to breakdown of skin Charcot's joint, left ankle and foot Type 2 diabetes mellitus with diabetic neuropathy, unspecified Plan Discharge From [facility] Services: Discharge from [place] - patient to pad healed area daily , patient to wear shoes at all times, DO NOT go bare footed. 1. The patient can be discharged from the wound care center 2. I recommended an adhesive foam to protect this area even in his diabetic shoes 3. I counseled against walking barefoot in the home or even with stockings. 4. Continued inspection of the feet on a several times a week basis presumably by his wife 5. He will also need follow-up with vascular surgery who did his original angioplasties for noninvasive study Electronic Signature(s) Signed: 12/17/2019 5:25:34 PM By: Linton Ham MD Entered By: Linton Ham on 12/17/2019 08:27:29 -------------------------------------------------------------------------------- SuperBill Details Patient Name: Date of Service: Granville Lewis, RA YMO ND L. 12/17/2019 Medical  Record Number: [id number] Patient Account Number: [id number] Date of Birth/Sex: Treating RN: 1951-07-24 (68 y.o. [name]) Carlene Coria Primary Care Provider: [name] Other Clinician: Referring Provider: Treating Provider/Extender: Aundra Dubin, Clelia Schaumann in Treatment: 8 Diagnosis Coding ICD-10 Codes Code Description [phone] Non-pressure chronic ulcer of other part of left foot with fat layer exposed E11.51 Type 2  diabetes mellitus with diabetic peripheral angiopathy without gangrene E11.621 Type 2 diabetes mellitus with foot ulcer L97.521 Non-pressure chronic ulcer of other part of left foot limited to breakdown of skin M14.672 Charcot's joint, left ankle and foot E11.40 Type 2 diabetes mellitus with diabetic neuropathy, unspecified Facility Procedures CPT4 Code: [id number] Description: [phone] - WOUND CARE VISIT-LEV 2 EST PT Modifier: Quantity: 1 Physician Procedures : CPT4 Code Description Modifier [id number] - WC PHYS LEVEL 3 - EST PT ICD-10 Diagnosis Description L97.522 Non-pressure chronic ulcer of other part of left foot with fat layer exposed E11.51 Type 2 diabetes mellitus with diabetic peripheral  angiopathy without gangrene Quantity: 1 Electronic Signature(s) Signed: 12/17/2019 5:25:34 PM By: Linton Ham MD Entered By: Linton Ham on 12/17/2019 08:25:35

## 2019-12-24 ENCOUNTER — Encounter: Payer: Self-pay | Admitting: Sports Medicine

## 2019-12-24 ENCOUNTER — Ambulatory Visit (INDEPENDENT_AMBULATORY_CARE_PROVIDER_SITE_OTHER): Payer: Medicare Other | Admitting: Sports Medicine

## 2019-12-24 ENCOUNTER — Ambulatory Visit: Payer: Medicare Other | Admitting: Emergency Medicine

## 2019-12-24 ENCOUNTER — Other Ambulatory Visit: Payer: Self-pay

## 2019-12-24 VITALS — Temp 98.1°F

## 2019-12-24 DIAGNOSIS — E1142 Type 2 diabetes mellitus with diabetic polyneuropathy: Secondary | ICD-10-CM

## 2019-12-24 DIAGNOSIS — E11610 Type 2 diabetes mellitus with diabetic neuropathic arthropathy: Secondary | ICD-10-CM

## 2019-12-24 DIAGNOSIS — L84 Corns and callosities: Secondary | ICD-10-CM | POA: Diagnosis not present

## 2019-12-24 DIAGNOSIS — I739 Peripheral vascular disease, unspecified: Secondary | ICD-10-CM | POA: Diagnosis not present

## 2019-12-24 NOTE — Progress Notes (Signed)
Subjective: [name] is a 69 y.o. male patient seen in office for follow-up evaluation of ulceration of the left foot.  Patient reports that the wound has healed with the help of the wound care center however still has some pain along the lateral side of the foot when he is in a shoe reports that he feels like the shoe is rubbing and making the lateral side of his foot feel worse.  Patient has no other pedal complaints at this time.  Last A1c 13.0  FBS not recorded patient does not routinely check like before.  Patient Active Problem List   Diagnosis Date Noted  . Dyslipidemia 08/19/2019  . Uncontrolled type 2 diabetes mellitus with hyperglycemia ([place])   . Hyperkalemia   . Acute on chronic diastolic CHF (congestive heart failure), NYHA class 3 ([place])   . Heart block AV complete ([place])   . Acute renal failure with acute tubular necrosis superimposed on stage 4 chronic kidney disease ([place])   . Optic atrophy of both eyes 06/13/2019  . H/O small bowel obstruction 05/17/2019  . PVD (peripheral vascular disease) ([place]) 05/14/2019  . Ulcer of left foot due to type 2 diabetes mellitus ([place])   . Skin ulcer of left foot, limited to breakdown of skin ([place])   . Charcot foot due to diabetes mellitus ([place])   . Charcot's joint of foot, left   . SBO (small bowel obstruction) ([place]) 05/06/2019  . Complete heart block ([place]) 05/06/2019  . Moderate protein-calorie malnutrition ([place])   . Diabetic foot ulcer ([place]) 10/19/2018  . Diabetic foot infection ([place])   . Pseudophakia, both eyes 10/04/2018  . Stage 3 chronic kidney disease 09/27/2018  . Adrenal insufficiency ([place]) 04/09/2015  . Peripheral edema 09/17/2014  . Shortness of breath 09/17/2014  . Bronchospasm, acute 09/16/2014  . Partial small bowel obstruction ([place]) 09/16/2014  . Hyperglycemia   . Diabetic retinopathy associated with type 2 diabetes mellitus ([place]) 09/13/2014  . Abdominal pain 09/13/2014  . Cough 11/15/2012  . Pharyngitis  09/14/2012  . Fluid overload 09/03/2012  . Ileus following gastrointestinal surgery ([place]) 09/03/2012  . Hypokalemia 08/30/2012  . Small bowel obstruction s/p LOA [id number] 08/21/2012  . Abnormal EKG 08/21/2012  . Chest pain 08/21/2012  . Macular degeneration (senile) of retina 12/15/2011  . Lens replaced 12/15/2011  . Vitreous hemorrhage ([place]) 12/15/2011  . Essential hypertension 08/19/2008  . Regional enteritis of small intestine ([place]) 01/03/2008  . ERECTILE DYSFUNCTION, ORGANIC 01/03/2008  . Sarcoidosis 07/19/2007  . Type 2 diabetes, uncontrolled, with retinopathy ([place]) 07/19/2007  . HYPERLIPIDEMIA, MIXED 07/19/2007  . CERUMEN IMPACTION, BILATERAL 07/19/2007  . URINARY INCONTINENCE, STRESS, MALE 08/17/2006  . PROSTATE CANCER, HX OF 08/17/2006  . DIABETIC  RETINOPATHY 01/09/2002   Current Outpatient Medications on File Prior to Visit  Medication Sig Dispense Refill  . acetaminophen (TYLENOL) 650 MG CR tablet Take 650 mg by mouth every 8 (eight) hours as needed for pain.    [name] amLODipine (NORVASC) 10 MG tablet Take 1 tablet (10 mg total) by mouth daily. 90 tablet 3  . aspirin 81 MG chewable tablet Chew 1 tablet (81 mg total) by mouth every morning. 90 tablet 3  . atorvastatin (LIPITOR) 20 MG tablet Take 1 tablet (20 mg total) by mouth every morning. 90 tablet 3  . blood glucose meter kit and supplies KIT Dispense based on patient and insurance preference. Use up to four times daily as directed. 1 each 1  . Blood Glucose Monitoring Suppl (RELION  CONFIRM GLUCOSE MONITOR) w/Device KIT Use to check blood sugar 3 times a day. 1 kit 0  . clopidogrel (PLAVIX) 75 MG tablet Take 1 tablet (75 mg total) by mouth daily with breakfast. 30 tablet 3  . docusate sodium (COLACE) 100 MG capsule Take 100 mg by mouth daily as needed for mild constipation.    . gabapentin (NEURONTIN) 300 MG capsule TAKE 1 CAPSULE(300 MG) BY MOUTH THREE TIMES DAILY (Patient taking differently: Take 300 mg by mouth 2 (two)  times daily. Take 300 mg by mouth in the morning and 300 mg at bedtime- may take an additional 300 mg once a day as needed/as directed) 90 capsule 0  . Insulin Isophane & Regular Human (HUMULIN 70/30 MIX) (70-30) 100 UNIT/ML PEN Inject 16-20 Units into the skin daily before breakfast AND 16-20 Units daily before supper. 15 mL   . Insulin Syringe-Needle U-100 (BD INSULIN SYRINGE ULTRAFINE) 31G X 5/16" 0.5 ML MISC Use 2x a day for insulin 100 each 5  . Lancets (FREESTYLE) lancets Use as instructed 100 each 0  . Lancets 30G MISC     . Multiple Vitamins-Minerals (CENTRUM SILVER 50+MEN) TABS Take 1 tablet by mouth daily with breakfast.    . Polyethyl Glycol-Propyl Glycol (SYSTANE OP) Place 1 drop into both eyes as needed (for dryness).     . torsemide (DEMADEX) 100 MG tablet Take 0.5 tablets (50 mg total) by mouth daily. 60 tablet 2  . Wound Dressings (MEDIHONEY WOUND/BURN DRESSING) GEL Apply to affected are 3 times a week, and cover with sterile dressing. 30 mL 1   No current facility-administered medications on file prior to visit.   Allergies  Allergen Reactions  . Ace Inhibitors Itching and Cough  . Naproxen Anaphylaxis, Shortness Of Breath and Other (See Comments)    Throat swells. cannot breathe, and causes GI distress      Objective: There were no vitals filed for this visit.  General: Patient is awake, alert, oriented x 3 and in no acute distress.  Dermatology: Skin is warm and dry bilateral with a now healed ulcer with mild reactive keratosis plantar and lateral aspect of the left foot. There is no malodor, no drainage, no erythema, 1+ pitting edema lower extremity. No other acute signs of infection.   Vascular: Dorsalis Pedis pulse = 1/4 Bilateral,  Posterior Tibial pulse = 1/4 Bilateral,  Capillary Fill Time < 5 seconds  Neurologic: Protective sensation diminished bilateral.  Musculosketal: There is Charcot deformity noted on left foot.  Mild pain to previous ulceration that is  now healed at lateral aspect on left.   dNo results for input(s): GRAMSTAIN, LABORGA in the last 8760 hours.  Assessment and Plan:  Problem List Items Addressed This Visit      Endocrine   Charcot foot due to diabetes mellitus ([place])    Other Visit Diagnoses    Pre-ulcerative calluses    -  Primary   PAD (peripheral artery disease) ([place])       Diabetic peripheral neuropathy associated with type 2 diabetes mellitus ([place])         -Examined patient and re-discussed the progression of the now healed wound -Applied protective offloading padding dressing of Mepilex border to the area and advised patient to do the same daily until he can be fitted for [employer] or offloading insole for the left foot -Patient to continue to limit activities to tolerance advised patient that since his job is not a desk job do not recommend going  back to work until we get him in the proper fitting shoe that will not rub or irritate the area that could cause reulceration -Patient to return to the office risk for [facility] boot or custom offloading insole to the lateral foot foot on the left or sooner if problems or issues arise  Landis Martins, DPM

## 2019-12-25 NOTE — Progress Notes (Signed)
Cardiology Office Note:   Date:  12/26/2019  NAME:  [name]    MRN: [id number] DOB:  12/04/1950   PCP:  [name], MD  Cardiologist:  Evalina Field, MD   Referring MD: [name], MD   Chief Complaint  Patient presents with  . Follow-up   History of Present Illness:   [name] is a 69 y.o. male with a hx of HFpEF, PAD, CKD, DM, HTN, 1AVB who presents for follow-up of HFpEF.  He reports that he has been out of his medications for nearly 1 month.  He apparently recently lost his mother as she died from complications of kidney failure.  He also reports that recently after the funeral his cousin died.  He reports he was in the dumps and just not able to take care of himself like he should.  He stopped taking all his medications.  His most recent A1c is greater than 15 which also signifies he is not been taking his medications.  He reports increased lower extremity edema for the past 4 weeks.  He also reports shortness of breath with minimal activity such as walking short distances.  Symptoms are getting worse.  He has been out of his torsemide and not taking any of his blood pressure medications.  His blood pressure today is 157/64.  He has volume overload on examination.  He also reports that he is not been working on his diet as he should.  He does endorse high salty food intake.  He denies any chest pain or palpitations.  All other vital signs are unremarkable.  Problem List 1. HFpEF 2. PAD -s/p L anterior tibial angioplasty 05/30/2019 3. Pulmonary sarcoidosis  4. CKD  -baseline Cr ~1.9 5. Hypertension  6. Diabetes  -A1c >15.0 7. 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 HLD -T chol 136, HDL 53, LDL 74, TG 47  Past Medical History: Past Medical History:  Diagnosis Date  . Cellulitis and abscess of face 10/11/2007   Qualifier: Diagnosis of  By: Amil Amen MD, Benjamine Mola    . Diabetic retinopathy ([place])   . Diabetic retinopathy associated with type 2  diabetes mellitus ([place]) 09/13/2014   He works for [employer]  . Fluid overload 09/03/2012   Post op  . GERD (gastroesophageal reflux disease)   . Visual impairment     Past Surgical History: Past Surgical History:  Procedure Laterality Date  . ABDOMINAL AORTOGRAM W/LOWER EXTREMITY Left 05/30/2019   Procedure: ABDOMINAL AORTOGRAM W/LOWER EXTREMITY;  Surgeon: Marty Heck, MD;  Location: [facility];  Service: Cardiovascular;  Laterality: Left;  . CATARACT EXTRACTION W/ INTRAOCULAR LENS  IMPLANT, BILATERAL    . EYE SURGERY Bilateral    "laser OR for diabetic retinopathy"  . INGUINAL HERNIA REPAIR     [name] 07/12/2010), "don't remember which side"  . LAPAROTOMY  08/23/2012   Procedure: EXPLORATORY LAPAROTOMY;  Surgeon: [name], DO;  Location: [facility];  Service: General;  Laterality: N/A;  exploratory laparotomy with lysis of adhesions  . LYSIS OF ADHESION  08/23/2012   Procedure: LYSIS OF ADHESION;  Surgeon: [name], DO;  Location: [facility];  Service: General;;  . PERIPHERAL VASCULAR BALLOON ANGIOPLASTY  05/30/2019   Procedure: PERIPHERAL VASCULAR BALLOON ANGIOPLASTY;  Surgeon: Marty Heck, MD;  Location: [facility];  Service: Cardiovascular;;  left anterior tibial  . ROBOT ASSISTED LAPAROSCOPIC RADICAL PROSTATECTOMY  2000's   "had to finish manually after  machine broke"  . SHOULDER SURGERY  1970's   separation; from playing football"    Current Medications: Current Meds  Medication Sig  . acetaminophen (TYLENOL) 650 MG CR tablet Take 650 mg by mouth every 8 (eight) hours as needed for pain.  [name] amLODipine (NORVASC) 10 MG tablet Take 1 tablet (10 mg total) by mouth daily.  [name] aspirin 81 MG chewable tablet Chew 1 tablet (81 mg total) by mouth every morning.  [name] atorvastatin (LIPITOR) 20 MG tablet Take 1 tablet (20 mg total) by mouth every morning.  . blood glucose meter kit and supplies KIT Dispense based on patient and insurance  preference. Use up to four times daily as directed.  . Blood Glucose Monitoring Suppl (RELION CONFIRM GLUCOSE MONITOR) w/Device KIT Use to check blood sugar 3 times a day.  . clopidogrel (PLAVIX) 75 MG tablet Take 1 tablet (75 mg total) by mouth daily with breakfast.  . docusate sodium (COLACE) 100 MG capsule Take 100 mg by mouth daily as needed for mild constipation.  . gabapentin (NEURONTIN) 300 MG capsule TAKE 1 CAPSULE(300 MG) BY MOUTH THREE TIMES DAILY (Patient taking differently: Take 300 mg by mouth 2 (two) times daily. Take 300 mg by mouth in the morning and 300 mg at bedtime- may take an additional 300 mg once a day as needed/as directed)  . Insulin Isophane & Regular Human (HUMULIN 70/30 MIX) (70-30) 100 UNIT/ML PEN Inject 16-20 Units into the skin daily before breakfast AND 16-20 Units daily before supper.  . Insulin Syringe-Needle U-100 (BD INSULIN SYRINGE ULTRAFINE) 31G X 5/16" 0.5 ML MISC Use 2x a day for insulin  . Lancets (FREESTYLE) lancets Use as instructed  . Lancets 30G MISC   . Multiple Vitamins-Minerals (CENTRUM SILVER 50+MEN) TABS Take 1 tablet by mouth daily with breakfast.  . Polyethyl Glycol-Propyl Glycol (SYSTANE OP) Place 1 drop into both eyes as needed (for dryness).   . torsemide (DEMADEX) 100 MG tablet Take 0.5 tablets (50 mg total) by mouth daily.  . Wound Dressings (MEDIHONEY WOUND/BURN DRESSING) GEL Apply to affected are 3 times a week, and cover with sterile dressing.     Allergies:    Ace inhibitors and Naproxen   Social History: Social History   Socioeconomic History  . Marital status: Married    Spouse name: Not on file  . Number of children: Not on file  . Years of education: Not on file  . Highest education level: Not on file  Occupational History  . Not on file  Tobacco Use  . Smoking status: Former Smoker    Packs/day: 2.50    Years: 3.00    Pack years: 7.50    Types: Cigarettes    Quit date: 11/09/1966    Years since quitting: 53.1  .  Smokeless tobacco: Never Used  Substance and Sexual Activity  . Alcohol use: Yes    Comment: 04/08/2015 "maybe a beer/ or 2 or a glass of wine monthly"  . Drug use: No  . Sexual activity: Yes  Other Topics Concern  . Not on file  Social History Narrative  . Not on file   Social Determinants of Health   Financial Resource Strain:   . Difficulty of Paying Living Expenses:   Food Insecurity:   . Worried About Charity fundraiser in the Last Year:   . Arboriculturist in the Last Year:   Transportation Needs:   . Film/video editor (Medical):   [name] Lack of  Transportation (Non-Medical):   Physical Activity:   . Days of Exercise per Week:   . Minutes of Exercise per Session:   Stress:   . Feeling of Stress :   Social Connections:   . Frequency of Communication with Friends and Family:   . Frequency of Social Gatherings with Friends and Family:   . Attends Religious Services:   . Active Member of Clubs or Organizations:   . Attends Archivist Meetings:   [name] Marital Status:      Family History: The patient's family history includes Diabetes Mellitus II in his mother. There is no history of Colon cancer.  ROS:   All other ROS reviewed and negative. Pertinent positives noted in the HPI.     EKGs/Labs/Other Studies Reviewed:   The following studies were personally reviewed by me today:  Recent Labs: 05/06/2019: TSH 1.370 05/17/2019: ALT 31 06/25/2019: Magnesium 2.1 06/27/2019: Hemoglobin 8.5; Platelets 233 06/28/2019: B Natriuretic Peptide 306.1 06/29/2019: BUN 25; Creatinine, Ser 2.06; Potassium 4.0; Sodium 137   Recent Lipid Panel    Component Value Date/Time   CHOL 136 05/06/2019 0520   CHOL 158 04/19/2019 1013   TRIG 47 05/06/2019 0520   HDL 53 05/06/2019 0520   HDL 53 04/19/2019 1013   CHOLHDL 2.6 05/06/2019 0520   VLDL 9 05/06/2019 0520   LDLCALC 74 05/06/2019 0520   LDLCALC 87 04/19/2019 1013    Physical Exam:   VS:  BP (!) 157/64   Pulse 64    Ht _0  (1.702 m)   Wt 172 lb (78 kg)   SpO2 100%   BMI 26.94 kg/m    Wt Readings from Last 3 Encounters:  12/26/19 172 lb (78 kg)  11/20/19 169 lb 9.6 oz (76.9 kg)  09/25/19 172 lb 3.2 oz (78.1 kg)    General: Well nourished, well developed, in no acute distress Heart: Atraumatic, normal size  Eyes: PEERLA, EOMI  Neck: Supple, JVD 10 cm of water Endocrine: No thryomegaly Cardiac: Normal S1, S2; RRR; no murmurs, rubs, or gallops Lungs: Clear to auscultation bilaterally, no wheezing, rhonchi or rales  Abd: Soft, nontender, no hepatomegaly  Ext: 2+ pitting edema up to the knees Musculoskeletal: No deformities, BUE and BLE strength normal and equal Skin: Warm and dry, no rashes   Neuro: Alert and oriented to person, place, time, and situation, CNII-XII grossly intact, no focal deficits  Psych: Normal mood and affect   ASSESSMENT:   TRUTH BAROT is a 69 y.o. male who presents for the following: 1. Chronic diastolic heart failure ([place])   2. Essential hypertension   3. 1st degree AV block   4. Bradycardia   5. PAD (peripheral artery disease) ([place])     PLAN:   1. Chronic diastolic heart failure ([place]) -He has been out of all of his medications for 1 month.  He is recently gone through several losses in the family and this resulted in him not taking his medications.  He does have evidence of increased volume today.  I do not think he needs to go to the hospital at this point.  We will plan to put him on torsemide 100 mg daily for 3 days and then reduce the dose to 50 mg daily.  He has been advised to avoid high salty foods.  His blood pressure is elevated today likely due to increased volume.  I will also check a BMP just to make sure his kidney function has not worsened.  We  will also refill his amlodipine 10 mg daily.  2. Essential hypertension -Refill amlodipine.  3. 1st degree AV block 4. Bradycardia -Avoid beta-blockers.  5. PAD (peripheral artery disease) ([place]) -Status  post angioplasty in the right leg.  We will continue his aspirin as well as Plavix for now.  He will follow with vascular surgery.  I will go ahead and increase his Lipitor to 40 mg daily.  Most recent LDL was around 74.   Disposition: Return in about 1 week (around 01/02/2020).  Medication Adjustments/Labs and Tests Ordered: Current medicines are reviewed at length with the patient today.  Concerns regarding medicines are outlined above.  No orders of the defined types were placed in this encounter.  No orders of the defined types were placed in this encounter.   Patient Instructions  Medication Instructions:  Take Torsemide 100 mg tablet for 3 days- then take 0.5 tablet (50 mg) every day after.  *If you need a refill on your cardiac medications before your next appointment, please call your pharmacy*   Lab Work: BMET today  If you have labs (blood work) drawn today and your tests are completely normal, you will receive your results only by: [name] MyChart Message (if you have MyChart) OR . A paper copy in the mail If you have any lab test that is abnormal or we need to change your treatment, we will call you to review the results.   Follow-Up: At [facility], you and your health needs are our priority.  As part of our continuing mission to provide you with exceptional heart care, we have created designated Provider Care Teams.  These Care Teams include your primary Cardiologist (physician) and Advanced Practice Providers (APPs -  Physician Assistants and Nurse Practitioners) who all work together to provide you with the care you need, when you need it.  We recommend signing up for the patient portal called "MyChart".  Sign up information is provided on this After Visit Summary.  MyChart is used to connect with patients for Virtual Visits (Telemedicine).  Patients are able to view lab/test results, encounter notes, upcoming appointments, etc.  Non-urgent messages can be sent to your  provider as well.   To learn more about what you can do with MyChart, go to [email or web address].    Your next appointment:   Next week  The format for your next appointment:   In Person  Provider:   Eleonore Chiquito, MD         Time Spent with Patient: I have spent a total of 35 minutes with patient reviewing hospital notes, telemetry, EKGs, labs and examining the patient as well as establishing an assessment and plan that was discussed with the patient.  > 50% of time was spent in direct patient care.  Signed, Addison Naegeli. Audie Box, [place]  [address] [place], [place] [address] [phone]  12/26/2019 10:27 AM

## 2019-12-26 ENCOUNTER — Other Ambulatory Visit: Payer: Self-pay

## 2019-12-26 ENCOUNTER — Ambulatory Visit (INDEPENDENT_AMBULATORY_CARE_PROVIDER_SITE_OTHER): Payer: Medicare Other | Admitting: Cardiovascular Disease

## 2019-12-26 ENCOUNTER — Encounter: Payer: Self-pay | Admitting: Cardiovascular Disease

## 2019-12-26 VITALS — BP 157/64 | HR 64 | Ht 67.0 in | Wt 172.0 lb

## 2019-12-26 DIAGNOSIS — I5032 Chronic diastolic (congestive) heart failure: Secondary | ICD-10-CM | POA: Diagnosis not present

## 2019-12-26 DIAGNOSIS — R001 Bradycardia, unspecified: Secondary | ICD-10-CM | POA: Diagnosis not present

## 2019-12-26 DIAGNOSIS — I739 Peripheral vascular disease, unspecified: Secondary | ICD-10-CM

## 2019-12-26 DIAGNOSIS — I10 Essential (primary) hypertension: Secondary | ICD-10-CM

## 2019-12-26 DIAGNOSIS — I440 Atrioventricular block, first degree: Secondary | ICD-10-CM | POA: Diagnosis not present

## 2019-12-26 LAB — BASIC METABOLIC PANEL
BUN/Creatinine Ratio: 8 — ABNORMAL LOW (ref 10–24)
BUN: 15 mg/dL (ref 8–27)
CO2: 23 mmol/L (ref 20–29)
Calcium: 9.4 mg/dL (ref 8.6–10.2)
Chloride: 99 mmol/L (ref 96–106)
Creatinine, Ser: 2 mg/dL — ABNORMAL HIGH (ref 0.76–1.27)
GFR calc Af Amer: 38 mL/min/{1.73_m2} — ABNORMAL LOW (ref >59)
GFR calc non Af Amer: 33 mL/min/{1.73_m2} — ABNORMAL LOW (ref >59)
Glucose: 182 mg/dL — ABNORMAL HIGH (ref 65–99)
Potassium: 5.5 mmol/L — ABNORMAL HIGH (ref 3.5–5.2)
Sodium: 138 mmol/L (ref 134–144)

## 2019-12-26 MED ORDER — CLOPIDOGREL BISULFATE 75 MG PO TABS: 75.0000 mg | ORAL_TABLET | Freq: Every day | ORAL | 3 refills | Status: DC

## 2019-12-26 MED ORDER — TORSEMIDE 100 MG PO TABS: 50.0000 mg | ORAL_TABLET | Freq: Every day | ORAL | 2 refills | Status: DC

## 2019-12-26 MED ORDER — AMLODIPINE BESYLATE 10 MG PO TABS: 10.0000 mg | ORAL_TABLET | Freq: Every day | ORAL | 3 refills | Status: AC

## 2019-12-26 MED ORDER — ATORVASTATIN CALCIUM 40 MG PO TABS: 40.0000 mg | ORAL_TABLET | Freq: Every morning | ORAL | 1 refills | Status: DC

## 2019-12-26 NOTE — Patient Instructions (Signed)
Medication Instructions:  Take Torsemide 100 mg tablet for 3 days- then take 0.5 tablet (50 mg) every day after.  *If you need a refill on your cardiac medications before your next appointment, please call your pharmacy*   Lab Work: BMET today  If you have labs (blood work) drawn today and your tests are completely normal, you will receive your results only by: [name] MyChart Message (if you have MyChart) OR . A paper copy in the mail If you have any lab test that is abnormal or we need to change your treatment, we will call you to review the results.   Follow-Up: At [facility], you and your health needs are our priority.  As part of our continuing mission to provide you with exceptional heart care, we have created designated Provider Care Teams.  These Care Teams include your primary Cardiologist (physician) and Advanced Practice Providers (APPs -  Physician Assistants and Nurse Practitioners) who all work together to provide you with the care you need, when you need it.  We recommend signing up for the patient portal called "MyChart".  Sign up information is provided on this After Visit Summary.  MyChart is used to connect with patients for Virtual Visits (Telemedicine).  Patients are able to view lab/test results, encounter notes, upcoming appointments, etc.  Non-urgent messages can be sent to your provider as well.   To learn more about what you can do with MyChart, go to [email or web address].    Your next appointment:   Next week  The format for your next appointment:   In Person  Provider:   Eleonore Chiquito, MD

## 2019-12-31 ENCOUNTER — Inpatient Hospital Stay (HOSPITAL_COMMUNITY)
Admission: AD | Admit: 2019-12-31 | Discharge: 2020-01-03 | DRG: 291 | Disposition: A | Discharge status: Home / Self Care | Payer: Medicare Other | Source: Ambulatory Visit | Attending: Cardiovascular Disease | Admitting: Cardiovascular Disease

## 2019-12-31 ENCOUNTER — Other Ambulatory Visit: Payer: Self-pay

## 2019-12-31 ENCOUNTER — Telehealth: Payer: Self-pay | Admitting: Family Medicine

## 2019-12-31 ENCOUNTER — Ambulatory Visit: Payer: Medicare Other | Admitting: Cardiovascular Disease

## 2019-12-31 ENCOUNTER — Inpatient Hospital Stay (HOSPITAL_COMMUNITY): Payer: Medicare Other

## 2019-12-31 ENCOUNTER — Encounter: Payer: Self-pay | Admitting: Cardiovascular Disease

## 2019-12-31 VITALS — BP 144/62 | HR 70 | Ht 66.0 in | Wt 169.2 lb

## 2019-12-31 DIAGNOSIS — Z9114 Patient's other noncompliance with medication regimen: Secondary | ICD-10-CM | POA: Diagnosis not present

## 2019-12-31 DIAGNOSIS — H547 Unspecified visual loss: Secondary | ICD-10-CM | POA: Diagnosis present

## 2019-12-31 DIAGNOSIS — R001 Bradycardia, unspecified: Secondary | ICD-10-CM

## 2019-12-31 DIAGNOSIS — Z79899 Other long term (current) drug therapy: Secondary | ICD-10-CM

## 2019-12-31 DIAGNOSIS — K219 Gastro-esophageal reflux disease without esophagitis: Secondary | ICD-10-CM | POA: Diagnosis present

## 2019-12-31 DIAGNOSIS — I5033 Acute on chronic diastolic (congestive) heart failure: Secondary | ICD-10-CM | POA: Diagnosis present

## 2019-12-31 DIAGNOSIS — Z794 Long term (current) use of insulin: Secondary | ICD-10-CM

## 2019-12-31 DIAGNOSIS — Z886 Allergy status to analgesic agent status: Secondary | ICD-10-CM

## 2019-12-31 DIAGNOSIS — L97428 Non-pressure chronic ulcer of left heel and midfoot with other specified severity: Secondary | ICD-10-CM | POA: Diagnosis present

## 2019-12-31 DIAGNOSIS — D860 Sarcoidosis of lung: Secondary | ICD-10-CM | POA: Diagnosis present

## 2019-12-31 DIAGNOSIS — I10 Essential (primary) hypertension: Secondary | ICD-10-CM | POA: Diagnosis present

## 2019-12-31 DIAGNOSIS — Z7982 Long term (current) use of aspirin: Secondary | ICD-10-CM

## 2019-12-31 DIAGNOSIS — R609 Edema, unspecified: Secondary | ICD-10-CM | POA: Diagnosis present

## 2019-12-31 DIAGNOSIS — E11621 Type 2 diabetes mellitus with foot ulcer: Secondary | ICD-10-CM | POA: Diagnosis present

## 2019-12-31 DIAGNOSIS — E782 Mixed hyperlipidemia: Secondary | ICD-10-CM

## 2019-12-31 DIAGNOSIS — Z7289 Other problems related to lifestyle: Secondary | ICD-10-CM

## 2019-12-31 DIAGNOSIS — E11319 Type 2 diabetes mellitus with unspecified diabetic retinopathy without macular edema: Secondary | ICD-10-CM | POA: Diagnosis present

## 2019-12-31 DIAGNOSIS — L03116 Cellulitis of left lower limb: Secondary | ICD-10-CM | POA: Diagnosis present

## 2019-12-31 DIAGNOSIS — IMO0002 Reserved for concepts with insufficient information to code with codable children: Secondary | ICD-10-CM | POA: Diagnosis present

## 2019-12-31 DIAGNOSIS — E8770 Fluid overload, unspecified: Secondary | ICD-10-CM | POA: Diagnosis not present

## 2019-12-31 DIAGNOSIS — E1151 Type 2 diabetes mellitus with diabetic peripheral angiopathy without gangrene: Secondary | ICD-10-CM | POA: Diagnosis present

## 2019-12-31 DIAGNOSIS — I509 Heart failure, unspecified: Secondary | ICD-10-CM

## 2019-12-31 DIAGNOSIS — I5031 Acute diastolic (congestive) heart failure: Secondary | ICD-10-CM | POA: Diagnosis not present

## 2019-12-31 DIAGNOSIS — R0602 Shortness of breath: Secondary | ICD-10-CM | POA: Diagnosis present

## 2019-12-31 DIAGNOSIS — Z9111 Patient's noncompliance with dietary regimen: Secondary | ICD-10-CM

## 2019-12-31 DIAGNOSIS — I440 Atrioventricular block, first degree: Secondary | ICD-10-CM | POA: Diagnosis present

## 2019-12-31 DIAGNOSIS — Z7902 Long term (current) use of antithrombotics/antiplatelets: Secondary | ICD-10-CM | POA: Diagnosis not present

## 2019-12-31 DIAGNOSIS — N184 Chronic kidney disease, stage 4 (severe): Secondary | ICD-10-CM | POA: Diagnosis present

## 2019-12-31 DIAGNOSIS — I739 Peripheral vascular disease, unspecified: Secondary | ICD-10-CM

## 2019-12-31 DIAGNOSIS — E785 Hyperlipidemia, unspecified: Secondary | ICD-10-CM | POA: Diagnosis present

## 2019-12-31 DIAGNOSIS — Z833 Family history of diabetes mellitus: Secondary | ICD-10-CM

## 2019-12-31 DIAGNOSIS — Z9079 Acquired absence of other genital organ(s): Secondary | ICD-10-CM | POA: Diagnosis not present

## 2019-12-31 DIAGNOSIS — I130 Hypertensive heart and chronic kidney disease with heart failure and stage 1 through stage 4 chronic kidney disease, or unspecified chronic kidney disease: Secondary | ICD-10-CM | POA: Diagnosis present

## 2019-12-31 DIAGNOSIS — N179 Acute kidney failure, unspecified: Secondary | ICD-10-CM | POA: Diagnosis present

## 2019-12-31 DIAGNOSIS — E1165 Type 2 diabetes mellitus with hyperglycemia: Secondary | ICD-10-CM | POA: Diagnosis not present

## 2019-12-31 DIAGNOSIS — Z888 Allergy status to other drugs, medicaments and biological substances status: Secondary | ICD-10-CM

## 2019-12-31 DIAGNOSIS — E875 Hyperkalemia: Secondary | ICD-10-CM | POA: Diagnosis present

## 2019-12-31 DIAGNOSIS — N183 Chronic kidney disease, stage 3 unspecified: Secondary | ICD-10-CM | POA: Diagnosis not present

## 2019-12-31 DIAGNOSIS — E1122 Type 2 diabetes mellitus with diabetic chronic kidney disease: Secondary | ICD-10-CM | POA: Diagnosis present

## 2019-12-31 DIAGNOSIS — Z20822 Contact with and (suspected) exposure to covid-19: Secondary | ICD-10-CM | POA: Diagnosis present

## 2019-12-31 DIAGNOSIS — Z87891 Personal history of nicotine dependence: Secondary | ICD-10-CM

## 2019-12-31 DIAGNOSIS — I5032 Chronic diastolic (congestive) heart failure: Secondary | ICD-10-CM

## 2019-12-31 LAB — COMPREHENSIVE METABOLIC PANEL
ALT: 21 U/L (ref 0–44)
AST: 25 U/L (ref 15–41)
Albumin: 3.3 g/dL — ABNORMAL LOW (ref 3.5–5.0)
Alkaline Phosphatase: 110 U/L (ref 38–126)
Anion gap: 13 (ref 5–15)
BUN: 26 mg/dL — ABNORMAL HIGH (ref 8–23)
CO2: 30 mmol/L (ref 22–32)
Calcium: 9.6 mg/dL (ref 8.9–10.3)
Chloride: 95 mmol/L — ABNORMAL LOW (ref 98–111)
Creatinine, Ser: 2.65 mg/dL — ABNORMAL HIGH (ref 0.61–1.24)
GFR calc Af Amer: 27 mL/min — ABNORMAL LOW (ref >60)
GFR calc non Af Amer: 23 mL/min — ABNORMAL LOW (ref >60)
Glucose, Bld: 271 mg/dL — ABNORMAL HIGH (ref 70–99)
Potassium: 5.3 mmol/L — ABNORMAL HIGH (ref 3.5–5.1)
Sodium: 138 mmol/L (ref 135–145)
Total Bilirubin: 1.2 mg/dL (ref 0.3–1.2)
Total Protein: 7.2 g/dL (ref 6.5–8.1)

## 2019-12-31 LAB — CBC WITH DIFFERENTIAL/PLATELET
Abs Immature Granulocytes: 0.02 10*3/uL (ref 0.00–0.07)
Basophils Absolute: 0 10*3/uL (ref 0.0–0.1)
Basophils Relative: 1 %
Eosinophils Absolute: 0.2 10*3/uL (ref 0.0–0.5)
Eosinophils Relative: 4 %
HCT: 36.2 % — ABNORMAL LOW (ref 39.0–52.0)
Hemoglobin: 11.1 g/dL — ABNORMAL LOW (ref 13.0–17.0)
Immature Granulocytes: 1 %
Lymphocytes Relative: 26 %
Lymphs Abs: 1 10*3/uL (ref 0.7–4.0)
MCH: 20.7 pg — ABNORMAL LOW (ref 26.0–34.0)
MCHC: 30.7 g/dL (ref 30.0–36.0)
MCV: 67.7 fL — ABNORMAL LOW (ref 80.0–100.0)
Monocytes Absolute: 0.5 10*3/uL (ref 0.1–1.0)
Monocytes Relative: 14 %
Neutro Abs: 2 10*3/uL (ref 1.7–7.7)
Neutrophils Relative %: 54 %
Platelets: 209 10*3/uL (ref 150–400)
RBC: 5.35 MIL/uL (ref 4.22–5.81)
RDW: 15.1 % (ref 11.5–15.5)
WBC: 3.8 10*3/uL — ABNORMAL LOW (ref 4.0–10.5)
nRBC: 0 % (ref 0.0–0.2)

## 2019-12-31 LAB — BRAIN NATRIURETIC PEPTIDE: B Natriuretic Peptide: 168.3 pg/mL — ABNORMAL HIGH (ref 0.0–100.0)

## 2019-12-31 LAB — SARS CORONAVIRUS 2 BY RT PCR (HOSPITAL ORDER, PERFORMED IN ~~LOC~~ HOSPITAL LAB): SARS Coronavirus 2: NEGATIVE

## 2019-12-31 LAB — HIV ANTIBODY (ROUTINE TESTING W REFLEX): HIV Screen 4th Generation wRfx: NONREACTIVE

## 2019-12-31 LAB — GLUCOSE, CAPILLARY
Glucose-Capillary: 290 mg/dL — ABNORMAL HIGH (ref 70–99)
Glucose-Capillary: 267 mg/dL — ABNORMAL HIGH (ref 70–99)

## 2019-12-31 MED ORDER — ONDANSETRON HCL 4 MG/2ML IJ SOLN: 4.0000 mg | Freq: Four times a day (QID) | INTRAMUSCULAR | Status: DC | PRN

## 2019-12-31 MED ORDER — INSULIN ASPART 100 UNIT/ML ~~LOC~~ SOLN
0.0000 [IU] | Freq: Three times a day (TID) | SUBCUTANEOUS | Status: DC
  Administered 2020-01-01: 3 [IU] via SUBCUTANEOUS
  Administered 2020-01-01: 5 [IU] via SUBCUTANEOUS
  Administered 2020-01-02 – 2020-01-03 (×2): 3 [IU] via SUBCUTANEOUS
  Administered 2020-01-03: 2 [IU] via SUBCUTANEOUS

## 2019-12-31 MED ORDER — ENOXAPARIN SODIUM 40 MG/0.4ML ~~LOC~~ SOLN
40.0000 mg | SUBCUTANEOUS | Status: DC
  Administered 2019-12-31: 40 mg via SUBCUTANEOUS
  Filled 2019-12-31: qty 0.4

## 2019-12-31 MED ORDER — CENTRUM SILVER 50+MEN PO TABS: 1.0000 | ORAL_TABLET | Freq: Every day | ORAL | Status: DC

## 2019-12-31 MED ORDER — CLOPIDOGREL BISULFATE 75 MG PO TABS
75.0000 mg | ORAL_TABLET | Freq: Every day | ORAL | Status: DC
  Administered 2020-01-01 – 2020-01-03 (×4): 75 mg via ORAL
  Filled 2019-12-31 (×4): qty 1

## 2019-12-31 MED ORDER — FUROSEMIDE 10 MG/ML IJ SOLN
100.0000 mg | Freq: Two times a day (BID) | INTRAVENOUS | Status: DC
  Administered 2020-01-01 – 2020-01-02 (×3): 100 mg via INTRAVENOUS
  Filled 2019-12-31 (×7): qty 10

## 2019-12-31 MED ORDER — DOCUSATE SODIUM 100 MG PO CAPS: 100.0000 mg | ORAL_CAPSULE | Freq: Every day | ORAL | Status: DC | PRN

## 2019-12-31 MED ORDER — INSULIN ASPART PROT & ASPART (70-30 MIX) 100 UNIT/ML ~~LOC~~ SUSP
10.0000 [IU] | Freq: Two times a day (BID) | SUBCUTANEOUS | Status: DC
  Administered 2019-12-31 – 2020-01-03 (×5): 10 [IU] via SUBCUTANEOUS
  Filled 2019-12-31: qty 10

## 2019-12-31 MED ORDER — POLYETHYL GLYCOL-PROPYL GLYCOL 0.4-0.3 % OP GEL: OPHTHALMIC | Status: DC | PRN

## 2019-12-31 MED ORDER — ASPIRIN 81 MG PO CHEW
81.0000 mg | CHEWABLE_TABLET | Freq: Every morning | ORAL | Status: DC
  Administered 2020-01-01 – 2020-01-03 (×3): 81 mg via ORAL
  Filled 2019-12-31 (×3): qty 1

## 2019-12-31 MED ORDER — AMLODIPINE BESYLATE 10 MG PO TABS
10.0000 mg | ORAL_TABLET | Freq: Every day | ORAL | Status: DC
  Administered 2019-12-31 – 2020-01-03 (×4): 10 mg via ORAL
  Filled 2019-12-31 (×4): qty 1

## 2019-12-31 MED ORDER — ADULT MULTIVITAMIN W/MINERALS CH
1.0000 | ORAL_TABLET | Freq: Every day | ORAL | Status: DC
  Administered 2020-01-01 – 2020-01-03 (×4): 1 via ORAL
  Filled 2019-12-31 (×4): qty 1

## 2019-12-31 MED ORDER — NITROGLYCERIN 0.4 MG SL SUBL: 0.4000 mg | SUBLINGUAL_TABLET | SUBLINGUAL | Status: DC | PRN

## 2019-12-31 MED ORDER — ACETAMINOPHEN 325 MG PO TABS
650.0000 mg | ORAL_TABLET | ORAL | Status: DC | PRN
  Administered 2020-01-01: 650 mg via ORAL
  Filled 2019-12-31: qty 2

## 2019-12-31 MED ORDER — ALPRAZOLAM 0.25 MG PO TABS
0.2500 mg | ORAL_TABLET | Freq: Two times a day (BID) | ORAL | Status: DC | PRN
  Administered 2020-01-01: 0.25 mg via ORAL
  Filled 2019-12-31: qty 1

## 2019-12-31 MED ORDER — GABAPENTIN 300 MG PO CAPS
300.0000 mg | ORAL_CAPSULE | Freq: Two times a day (BID) | ORAL | Status: DC
  Administered 2019-12-31 – 2020-01-03 (×6): 300 mg via ORAL
  Filled 2019-12-31 (×6): qty 1

## 2019-12-31 MED ORDER — ZOLPIDEM TARTRATE 5 MG PO TABS
5.0000 mg | ORAL_TABLET | Freq: Every evening | ORAL | Status: DC | PRN
  Administered 2019-12-31 – 2020-01-03 (×4): 5 mg via ORAL
  Filled 2019-12-31 (×3): qty 1

## 2019-12-31 MED ORDER — SODIUM CHLORIDE 0.9% FLUSH
3.0000 mL | Freq: Two times a day (BID) | INTRAVENOUS | Status: DC
  Administered 2019-12-31 – 2020-01-03 (×4): 3 mL via INTRAVENOUS

## 2019-12-31 MED ORDER — POLYVINYL ALCOHOL 1.4 % OP SOLN
1.0000 [drp] | OPHTHALMIC | Status: DC | PRN
  Filled 2019-12-31: qty 15

## 2019-12-31 MED ORDER — SODIUM CHLORIDE 0.9 % IV SOLN: 250.0000 mL | INTRAVENOUS | Status: DC | PRN

## 2019-12-31 MED ORDER — SODIUM CHLORIDE 0.9% FLUSH: 3.0000 mL | INTRAVENOUS | Status: DC | PRN

## 2019-12-31 MED ORDER — FUROSEMIDE 10 MG/ML IJ SOLN: 100.0000 mg | Freq: Two times a day (BID) | INTRAMUSCULAR | Status: DC

## 2019-12-31 MED ORDER — ATORVASTATIN CALCIUM 40 MG PO TABS
40.0000 mg | ORAL_TABLET | Freq: Every morning | ORAL | Status: DC
  Administered 2020-01-01 – 2020-01-03 (×3): 40 mg via ORAL
  Filled 2019-12-31 (×3): qty 1

## 2019-12-31 MED ORDER — INSULIN ASPART 100 UNIT/ML ~~LOC~~ SOLN
0.0000 [IU] | Freq: Every day | SUBCUTANEOUS | Status: DC
  Administered 2019-12-31: 3 [IU] via SUBCUTANEOUS

## 2019-12-31 NOTE — Progress Notes (Signed)
Cardiology Office Note:   Date:  12/31/2019  NAME:  [name]    MRN: [id number] DOB:  [birth year]   PCP:  [name], MD  Cardiologist:  Evalina Field, MD   Referring MD: [name], MD   Chief Complaint  Patient presents with  . Congestive Heart Failure   History of Present Illness:   [name] is a 69 y.o. male with a hx of heart failure with preserved ejection fraction, PAD, CKD stage III, uncontrolled diabetes, hypertension, hyperlipidemia who presents for follow-up of heart failure preserved ejection fraction.  He was seen last week after a hiatus of taking his medications.  He did have some family issues which she has been going through.  We did get him back on medications but have not made headway in terms of diuresis.  He did take 100 mg of torsemide for 2 days and then resume 50 mg daily.  His weights are still up.  He still remains grossly volume overloaded.  He has significant JVD 12-15 cm of water.  He has 2+ pitting edema left greater than right.  He does report he is profoundly short of breath with such activities as walking 10 to 15 feet.  I think he has failed outpatient therapy.  We do need to admit him for IV diuretic therapy.  He is in agreement with this.  Problem List 1. HFpEF 2. PAD -s/p L anterior tibial angioplasty 05/30/2019 3. Pulmonary sarcoidosis  4. CKD  -baseline Cr ~1.9 5. Hypertension  6. Diabetes  -A1c >15.0 7. 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 HLD -T chol 136, HDL 53, LDL 74, TG 47  Past Medical History: Past Medical History:  Diagnosis Date  . Cellulitis and abscess of face 10/11/2007   Qualifier: Diagnosis of  By: Amil Amen MD, Benjamine Mola    . Diabetic retinopathy ([place])   . Diabetic retinopathy associated with type 2 diabetes mellitus ([place]) 09/13/2014   He works for [employer]  . Fluid overload 09/03/2012   Post op  . GERD (gastroesophageal reflux disease)   . Visual impairment     Past  Surgical History: Past Surgical History:  Procedure Laterality Date  . ABDOMINAL AORTOGRAM W/LOWER EXTREMITY Left 05/30/2019   Procedure: ABDOMINAL AORTOGRAM W/LOWER EXTREMITY;  Surgeon: Marty Heck, MD;  Location: [facility];  Service: Cardiovascular;  Laterality: Left;  . CATARACT EXTRACTION W/ INTRAOCULAR LENS  IMPLANT, BILATERAL    . EYE SURGERY Bilateral    "laser OR for diabetic retinopathy"  . INGUINAL HERNIA REPAIR     [name] 07/12/2010), "don't remember which side"  . LAPAROTOMY  08/23/2012   Procedure: EXPLORATORY LAPAROTOMY;  Surgeon: [name], DO;  Location: [facility];  Service: General;  Laterality: N/A;  exploratory laparotomy with lysis of adhesions  . LYSIS OF ADHESION  08/23/2012   Procedure: LYSIS OF ADHESION;  Surgeon: [name], DO;  Location: [facility];  Service: General;;  . PERIPHERAL VASCULAR BALLOON ANGIOPLASTY  05/30/2019   Procedure: PERIPHERAL VASCULAR BALLOON ANGIOPLASTY;  Surgeon: Marty Heck, MD;  Location: [facility];  Service: Cardiovascular;;  left anterior tibial  . ROBOT ASSISTED LAPAROSCOPIC RADICAL PROSTATECTOMY  2000's   "had to finish manually after machine broke"  . SHOULDER SURGERY  1970's   separation; from playing football"    Current Medications: Current Meds  Medication Sig  . acetaminophen (TYLENOL) 650 MG CR tablet Take 650 mg by mouth every 8 (  eight) hours as needed for pain.  [name] amLODipine (NORVASC) 10 MG tablet Take 1 tablet (10 mg total) by mouth daily.  [name] aspirin 81 MG chewable tablet Chew 1 tablet (81 mg total) by mouth every morning.  [name] atorvastatin (LIPITOR) 40 MG tablet Take 1 tablet (40 mg total) by mouth every morning.  . blood glucose meter kit and supplies KIT Dispense based on patient and insurance preference. Use up to four times daily as directed.  . Blood Glucose Monitoring Suppl (RELION CONFIRM GLUCOSE MONITOR) w/Device KIT Use to check blood sugar 3 times a day.  . clopidogrel (PLAVIX) 75  MG tablet Take 1 tablet (75 mg total) by mouth daily with breakfast.  . docusate sodium (COLACE) 100 MG capsule Take 100 mg by mouth daily as needed for mild constipation.  . furosemide (LASIX) 40 MG tablet Take 40 mg by mouth daily.  [name] gabapentin (NEURONTIN) 300 MG capsule TAKE 1 CAPSULE(300 MG) BY MOUTH THREE TIMES DAILY (Patient taking differently: Take 300 mg by mouth 2 (two) times daily. Take 300 mg by mouth in the morning and 300 mg at bedtime- may take an additional 300 mg once a day as needed/as directed)  . Insulin Isophane & Regular Human (HUMULIN 70/30 MIX) (70-30) 100 UNIT/ML PEN Inject 16-20 Units into the skin daily before breakfast AND 16-20 Units daily before supper.  . Insulin Syringe-Needle U-100 (BD INSULIN SYRINGE ULTRAFINE) 31G X 5/16" 0.5 ML MISC Use 2x a day for insulin  . Lancets (FREESTYLE) lancets Use as instructed  . Lancets 30G MISC   . Multiple Vitamins-Minerals (CENTRUM SILVER 50+MEN) TABS Take 1 tablet by mouth daily with breakfast.  . Polyethyl Glycol-Propyl Glycol (SYSTANE OP) Place 1 drop into both eyes as needed (for dryness).   . torsemide (DEMADEX) 100 MG tablet Take 0.5 tablets (50 mg total) by mouth daily.  . Wound Dressings (MEDIHONEY WOUND/BURN DRESSING) GEL Apply to affected are 3 times a week, and cover with sterile dressing.     Allergies:    Ace inhibitors and Naproxen   Social History: Social History   Socioeconomic History  . Marital status: Married    Spouse name: Not on file  . Number of children: Not on file  . Years of education: Not on file  . Highest education level: Not on file  Occupational History  . Not on file  Tobacco Use  . Smoking status: Former Smoker    Packs/day: 2.50    Years: 3.00    Pack years: 7.50    Types: Cigarettes    Quit date: 11/09/1966    Years since quitting: 53.1  . Smokeless tobacco: Never Used  Substance and Sexual Activity  . Alcohol use: Yes    Comment: 04/08/2015 "maybe a beer/ or 2 or a glass of  wine monthly"  . Drug use: No  . Sexual activity: Yes  Other Topics Concern  . Not on file  Social History Narrative  . Not on file   Social Determinants of Health   Financial Resource Strain:   . Difficulty of Paying Living Expenses:   Food Insecurity:   . Worried About Charity fundraiser in the Last Year:   . Arboriculturist in the Last Year:   Transportation Needs:   . Film/video editor (Medical):   [name] Lack of Transportation (Non-Medical):   Physical Activity:   . Days of Exercise per Week:   . Minutes of Exercise per Session:   Stress:   .  Feeling of Stress :   Social Connections:   . Frequency of Communication with Friends and Family:   . Frequency of Social Gatherings with Friends and Family:   . Attends Religious Services:   . Active Member of Clubs or Organizations:   . Attends Archivist Meetings:   [name] Marital Status:      Family History: The patient's family history includes Diabetes Mellitus II in his mother. There is no history of Colon cancer.  ROS:   All other ROS reviewed and negative. Pertinent positives noted in the HPI.     EKGs/Labs/Other Studies Reviewed:   The following studies were personally reviewed by me today:  Recent Labs: 05/06/2019: TSH 1.370 05/17/2019: ALT 31 06/25/2019: Magnesium 2.1 06/27/2019: Hemoglobin 8.5; Platelets 233 06/28/2019: B Natriuretic Peptide 306.1 12/26/2019: BUN 15; Creatinine, Ser 2.00; Potassium 5.5; Sodium 138   Recent Lipid Panel    Component Value Date/Time   CHOL 136 05/06/2019 0520   CHOL 158 04/19/2019 1013   TRIG 47 05/06/2019 0520   HDL 53 05/06/2019 0520   HDL 53 04/19/2019 1013   CHOLHDL 2.6 05/06/2019 0520   VLDL 9 05/06/2019 0520   LDLCALC 74 05/06/2019 0520   LDLCALC 87 04/19/2019 1013    Physical Exam:   VS:  BP (!) 144/62   Pulse 70   Ht '5\' 6"'  (1.676 m)   Wt 169 lb 3.2 oz (76.7 kg)   SpO2 99%   BMI 27.31 kg/m    Wt Readings from Last 3 Encounters:  12/31/19 169 lb 3.2  oz (76.7 kg)  12/26/19 172 lb (78 kg)  11/20/19 169 lb 9.6 oz (76.9 kg)    General: Well nourished, well developed, in no acute distress Heart: Atraumatic, normal size  Eyes: PEERLA, EOMI  Neck: Supple, JVD 12 to 15 cm water Endocrine: No thryomegaly Cardiac: Normal S1, S2; RRR; no murmurs, rubs, or gallops Lungs: Clear to auscultation bilaterally, no wheezing, rhonchi or rales  Abd: Soft, nontender, no hepatomegaly  Ext: 2+ pitting edema, left > right, up to knees Musculoskeletal: No deformities, BUE and BLE strength normal and equal Skin: Warm and dry, no rashes   Neuro: Alert and oriented to person, place, time, and situation, CNII-XII grossly intact, no focal deficits  Psych: Normal mood and affect   ASSESSMENT:   [name] is a 69 y.o. male who presents for the following: 1. Chronic diastolic heart failure ([place])   2. Essential hypertension   3. 1st degree AV block   4. Bradycardia   5. PAD (peripheral artery disease) ([place])   6. Mixed hyperlipidemia     PLAN:   1. Chronic diastolic heart failure ([name]) -He presents for follow-up of HFpEF.  We did try to get him back on medications last week.  We have not made an appropriate response despite high-dose outpatient diuretic therapy.  I recommended direct admission to the hospital.  He is in agreement. -He will likely need on 100 mg of IV Lasix twice daily.  He will also need a chest x-ray and I would like for him to obtain venous duplex of the lower extremities to exclude DVT. -He is profoundly short of breath and I see no other option other than to admitted to the hospital.  He will need close monitoring of kidney function in addition to high-dose diuretic therapy.  2. Essential hypertension -Better on current medications.  Still volume overloaded.  See above.  He will be directly admitted.  3. 1st  degree AV block 4. Bradycardia -No issues with bradycardia.  Telemetry in hospital.  5. PAD (peripheral artery disease)  ([place]) -He will follow with vascular surgery.  No major issues here today.  6. Mixed hyperlipidemia -Continue statin therapy. Most recent LDL cholesterol 74  Disposition: Return in about 2 weeks (around 01/14/2020).  Medication Adjustments/Labs and Tests Ordered: Current medicines are reviewed at length with the patient today.  Concerns regarding medicines are outlined above.  No orders of the defined types were placed in this encounter.  No orders of the defined types were placed in this encounter.   Patient Instructions  HOSPITAL WILL CALL YOU FOR BED AT [place]  Medication Instructions:  The current medical regimen is effective;  continue present plan and medications.  *If you need a refill on your cardiac medications before your next appointment, please call your pharmacy*   Follow-Up: At [facility], you and your health needs are our priority.  As part of our continuing mission to provide you with exceptional heart care, we have created designated Provider Care Teams.  These Care Teams include your primary Cardiologist (physician) and Advanced Practice Providers (APPs -  Physician Assistants and Nurse Practitioners) who all work together to provide you with the care you need, when you need it.  We recommend signing up for the patient portal called "MyChart".  Sign up information is provided on this After Visit Summary.  MyChart is used to connect with patients for Virtual Visits (Telemedicine).  Patients are able to view lab/test results, encounter notes, upcoming appointments, etc.  Non-urgent messages can be sent to your provider as well.   To learn more about what you can do with MyChart, go to [email or web address].    Your next appointment:   2 week(s) after hospital   The format for your next appointment:   In Person  Provider:   Eleonore Chiquito, MD        Time Spent with Patient: I have spent a total of 35 minutes with patient reviewing hospital notes,  telemetry, EKGs, labs and examining the patient as well as establishing an assessment and plan that was discussed with the patient.  > 50% of time was spent in direct patient care.  Signed, Addison Naegeli. Audie Box, [place]  [address], [place], [place] [address] [phone]  12/31/2019 12:06 PM

## 2019-12-31 NOTE — Patient Instructions (Signed)
HOSPITAL WILL CALL YOU FOR BED AT [facility]  Medication Instructions:  The current medical regimen is effective;  continue present plan and medications.  *If you need a refill on your cardiac medications before your next appointment, please call your pharmacy*   Follow-Up: At [facility], you and your health needs are our priority.  As part of our continuing mission to provide you with exceptional heart care, we have created designated Provider Care Teams.  These Care Teams include your primary Cardiologist (physician) and Advanced Practice Providers (APPs -  Physician Assistants and Nurse Practitioners) who all work together to provide you with the care you need, when you need it.  We recommend signing up for the patient portal called "MyChart".  Sign up information is provided on this After Visit Summary.  MyChart is used to connect with patients for Virtual Visits (Telemedicine).  Patients are able to view lab/test results, encounter notes, upcoming appointments, etc.  Non-urgent messages can be sent to your provider as well.   To learn more about what you can do with MyChart, go to [email or web address].    Your next appointment:   2 week(s) after hospital   The format for your next appointment:   In Person  Provider:   Eleonore Chiquito, MD

## 2019-12-31 NOTE — H&P (Signed)
Cardiology Admission History and Physical:  Patient ID: [name] MRN: [id number] DOB: December 03, 1950  Admit date: 12/31/2019  Primary Care Provider: [name], MD Primary Cardiologist: Evalina Field, MD   Chief Complaint:  SOB  Patient Profile:  [name] is a 69 y.o. male with a hx of heart failure with preserved ejection fraction, PAD, CKD stage III, uncontrolled diabetes, hypertension, hyperlipidemia who is admitted for acute decompensated diastolic HF.     Problem List 1. HFpEF 2. PAD -s/p L anterior tibial angioplasty 05/30/2019 3. Pulmonary sarcoidosis  4. CKD  -baseline Cr ~1.9 5. Hypertension  6. Diabetes  -A1c>15.0 7. 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 HLD -T chol 136, HDL 53, LDL 74, TG 47  History of Present Illness:  He was seen last week in our office. He had stopped taking medications for 1 month. This included diuretics. We did start his torsemide back but he has only lost 3 lbs. He remains grossly volume overloaded. He is only able to walk 10-15 ft before becoming SOB. Associated symptoms included LE edema. He does have L > R LE edema. He endorses symptoms of PND and orthopnea. He denies CP or palpitations. He was evaluated in clinic and has failed to tolerate appropriate outpatient therapy. He will be admitted for IV diuretic therapy.   Heart Pathway Score:       Past Medical History: Past Medical History:  Diagnosis Date  . Cellulitis and abscess of face 10/11/2007   Qualifier: Diagnosis of  By: Amil Amen MD, Benjamine Mola    . Diabetic retinopathy ([place])   . Diabetic retinopathy associated with type 2 diabetes mellitus ([place]) 09/13/2014   He works for [name]  . Fluid overload 09/03/2012   Post op  . GERD (gastroesophageal reflux disease)   . Visual impairment     Past Surgical History: Past Surgical History:  Procedure Laterality Date  . ABDOMINAL AORTOGRAM W/LOWER EXTREMITY Left 05/30/2019   Procedure:  ABDOMINAL AORTOGRAM W/LOWER EXTREMITY;  Surgeon: Marty Heck, MD;  Location: [facility];  Service: Cardiovascular;  Laterality: Left;  . CATARACT EXTRACTION W/ INTRAOCULAR LENS  IMPLANT, BILATERAL    . EYE SURGERY Bilateral    "laser OR for diabetic retinopathy"  . INGUINAL HERNIA REPAIR     [name] 07/12/2010), "don't remember which side"  . LAPAROTOMY  08/23/2012   Procedure: EXPLORATORY LAPAROTOMY;  Surgeon: [name], DO;  Location: [facility];  Service: General;  Laterality: N/A;  exploratory laparotomy with lysis of adhesions  . LYSIS OF ADHESION  08/23/2012   Procedure: LYSIS OF ADHESION;  Surgeon: [name], DO;  Location: [facility];  Service: General;;  . PERIPHERAL VASCULAR BALLOON ANGIOPLASTY  05/30/2019   Procedure: PERIPHERAL VASCULAR BALLOON ANGIOPLASTY;  Surgeon: Marty Heck, MD;  Location: [facility];  Service: Cardiovascular;;  left anterior tibial  . ROBOT ASSISTED LAPAROSCOPIC RADICAL PROSTATECTOMY  2000's   "had to finish manually after machine broke"  . SHOULDER SURGERY  1970's   separation; from playing football"     Medications Prior to Admission: Prior to Admission medications   Medication Sig Start Date End Date Taking? Authorizing Provider  acetaminophen (TYLENOL) 650 MG CR tablet Take 650 mg by mouth every 8 (eight) hours as needed for pain.    [provider]  amLODipine (NORVASC) 10 MG tablet Take 1 tablet (10 mg total) by mouth daily. 12/26/19   O'NealCassie Freer, MD  aspirin 81 MG  chewable tablet Chew 1 tablet (81 mg total) by mouth every morning. 09/25/19   O'NealCassie Freer, MD  atorvastatin (LIPITOR) 40 MG tablet Take 1 tablet (40 mg total) by mouth every morning. 12/26/19   O'Neal, Cassie Freer, MD  blood glucose meter kit and supplies KIT Dispense based on patient and insurance preference. Use up to four times daily as directed. 09/10/19   Philemon Kingdom, MD  Blood Glucose Monitoring Suppl (RELION CONFIRM  GLUCOSE MONITOR) w/Device KIT Use to check blood sugar 3 times a day. 09/10/19   Philemon Kingdom, MD  clopidogrel (PLAVIX) 75 MG tablet Take 1 tablet (75 mg total) by mouth daily with breakfast. 12/26/19   O'Neal, Cassie Freer, MD  docusate sodium (COLACE) 100 MG capsule Take 100 mg by mouth daily as needed for mild constipation.    [provider]  furosemide (LASIX) 40 MG tablet Take 40 mg by mouth daily. 12/25/19   [provider]  gabapentin (NEURONTIN) 300 MG capsule TAKE 1 CAPSULE(300 MG) BY MOUTH THREE TIMES DAILY Patient taking differently: Take 300 mg by mouth 2 (two) times daily. Take 300 mg by mouth in the morning and 300 mg at bedtime- may take an additional 300 mg once a day as needed/as directed 02/12/19   [name], MD  Insulin Isophane & Regular Human (HUMULIN 70/30 MIX) (70-30) 100 UNIT/ML PEN Inject 16-20 Units into the skin daily before breakfast AND 16-20 Units daily before supper. 08/19/19   Philemon Kingdom, MD  Insulin Syringe-Needle U-100 (BD INSULIN SYRINGE ULTRAFINE) 31G X 5/16" 0.5 ML MISC Use 2x a day for insulin 08/19/19   Philemon Kingdom, MD  Lancets (FREESTYLE) lancets Use as instructed 09/18/14   Delfina Redwood, MD  Lancets 30G MISC  04/06/16   [provider]  Multiple Vitamins-Minerals (CENTRUM SILVER 50+MEN) TABS Take 1 tablet by mouth daily with breakfast.    [provider]  Polyethyl Glycol-Propyl Glycol (SYSTANE OP) Place 1 drop into both eyes as needed (for dryness).     [provider]  torsemide (DEMADEX) 100 MG tablet Take 0.5 tablets (50 mg total) by mouth daily. 12/26/19   O'Neal, Cassie Freer, MD  Wound Dressings (MEDIHONEY WOUND/BURN DRESSING) GEL Apply to affected are 3 times a week, and cover with sterile dressing. 08/21/19   Landis Martins, DPM     Allergies:    Allergies  Allergen Reactions  . Ace Inhibitors Itching and Cough  . Naproxen Anaphylaxis, Shortness Of Breath and Other (See Comments)      Throat swells. cannot breathe, and causes GI distress    Social History:   Social History   Socioeconomic History  . Marital status: Married    Spouse name: Not on file  . Number of children: Not on file  . Years of education: Not on file  . Highest education level: Not on file  Occupational History  . Not on file  Tobacco Use  . Smoking status: Former Smoker    Packs/day: 2.50    Years: 3.00    Pack years: 7.50    Types: Cigarettes    Quit date: 11/09/1966    Years since quitting: 53.1  . Smokeless tobacco: Never Used  Substance and Sexual Activity  . Alcohol use: Yes    Comment: 04/08/2015 "maybe a beer/ or 2 or a glass of wine monthly"  . Drug use: No  . Sexual activity: Yes  Other Topics Concern  . Not on file  Social History Narrative  .  Not on file   Social Determinants of Health   Financial Resource Strain:   . Difficulty of Paying Living Expenses:   Food Insecurity:   . Worried About Charity fundraiser in the Last Year:   . Arboriculturist in the Last Year:   Transportation Needs:   . Film/video editor (Medical):   [name] Lack of Transportation (Non-Medical):   Physical Activity:   . Days of Exercise per Week:   . Minutes of Exercise per Session:   Stress:   . Feeling of Stress :   Social Connections:   . Frequency of Communication with Friends and Family:   . Frequency of Social Gatherings with Friends and Family:   . Attends Religious Services:   . Active Member of Clubs or Organizations:   . Attends Archivist Meetings:   [name] Marital Status:   Intimate Partner Violence:   . Fear of Current or Ex-Partner:   . Emotionally Abused:   [name] Physically Abused:   . Sexually Abused:      Family History:  The patient's family history includes Diabetes Mellitus II in his mother. There is no history of Colon cancer.    ROS:  All other ROS reviewed and negative. Pertinent positives noted in the HPI.     Physical Exam/Data:  There were no  vitals filed for this visit. No intake or output data in the 24 hours ending 12/31/19 1715  Last 3 Weights 12/31/2019 12/26/2019 11/20/2019  Weight (lbs) 169 lb 3.2 oz 172 lb 169 lb 9.6 oz  Weight (kg) 76.749 kg 78.019 kg 76.93 kg    There is no height or weight on file to calculate BMI.   General: Well nourished, well developed, in no acute distress Heart: Atraumatic, normal size  Eyes: PEERLA, EOMI  Neck: Supple, JVD 12-15 cm H20 Endocrine: No thryomegaly Cardiac: Normal S1, S2; RRR; no murmurs, rubs, or gallops Lungs: Clear to auscultation bilaterally, no wheezing, rhonchi or rales  Abd: Soft, nontender, no hepatomegaly  Ext: 2+ pitting edema L > R Musculoskeletal: No deformities, BUE and BLE strength normal and equal Skin: Warm and dry, no rashes   Neuro: Alert and oriented to person, place, time, and situation, CNII-XII grossly intact, no focal deficits  Psych: Normal mood and affect   Relevant CV Studies: TTE 05/06/2019 1. Left ventricular ejection fraction, by visual estimation, is 60 to  65%. The left ventricle has normal function. Normal left ventricular size.  Mildly increased left ventricular posterior wall thickness. There is no  left ventricular hypertrophy.  2. Left ventricular diastolic Doppler parameters are consistent with  impaired relaxation pattern of LV diastolic filling.  3. Global right ventricle has normal systolic function.The right  ventricular size is normal. No increase in right ventricular wall  thickness.  4. Left atrial size was normal.  5. Right atrial size was normal.  6. The mitral valve is normal in structure. Mild mitral valve  regurgitation. No evidence of mitral stenosis.  7. The tricuspid valve is normal in structure. Tricuspid valve  regurgitation was not visualized by color flow Doppler.  8. The aortic valve is normal in structure. Aortic valve regurgitation is  mild by color flow Doppler. Structurally normal aortic valve, with no    evidence of sclerosis or stenosis.  9. The pulmonic valve was normal in structure. Pulmonic valve  regurgitation is not visualized by color flow Doppler.  10. The inferior vena cava is normal in size with greater  than 50%  respiratory variability, suggesting right atrial pressure of 3 mmHg.   Laboratory Data: High Sensitivity Troponin:  No results for input(s): TROPONINIHS in the last 720 hours.    Cardiac EnzymesNo results for input(s): TROPONINI in the last 168 hours. No results for input(s): TROPIPOC in the last 168 hours.  Chemistry Recent Labs  Lab 12/26/19 1046  NA 138  K 5.5*  CL 99  CO2 23  GLUCOSE 182*  BUN 15  CREATININE 2.00*  CALCIUM 9.4  GFRNONAA 33*  GFRAA 38*    No results for input(s): PROT, ALBUMIN, AST, ALT, ALKPHOS, BILITOT in the last 168 hours. HematologyNo results for input(s): WBC, RBC, HGB, HCT, MCV, MCH, MCHC, RDW, PLT in the last 168 hours. BNPNo results for input(s): BNP, PROBNP in the last 168 hours.  DDimer No results for input(s): DDIMER in the last 168 hours.  Radiology/Studies:  No results found.  Assessment and Plan:  1. Acute Diastolic HF/Volume overload/SOB: Was out of medications for one month. Seen in clinic last week and restarted on medications including diuretic. He has not improved. Will need to be admitted for diuresis. Will obtain CMP, BNP, CXR, and repeat echocardiogram. Will start with 100 mg IV lasix BID. Daily BMP. Strict I/Os. Close monitoring of Cr while in-house 2. LE edema, L >R: will obtain venous US of LEs to exclude DVT.  3. CKD 3: Close monitoring of Cr while in house.  4. DM: A1c >15. Has been off insulin. SSI while in-house. Will benefit for diabetes nursing education if possible.  5. HTN: resume home meds. No ACE/ARB due to CKD.  6. 1AVB: No issues. Avoid AV nodal agents. Had admission with concerns for CHB but was probably Wenckebach. No symptoms. Tele bed.  7. PAD: L anterior tibial angioplasty 05/2019.  ASA/plavix/statin.   FEN -no IVF -strict I/Os -salt restricted diet -dvt ppx: heparin  -code: full   Severity of Illness: The appropriate patient status for this patient is INPATIENT. Inpatient status is judged to be reasonable and necessary in order to provide the required intensity of service to ensure the patient's safety. The patient's presenting symptoms, physical exam findings, and initial radiographic and laboratory data in the context of their chronic comorbidities is felt to place them at high risk for further clinical deterioration. Furthermore, it is not anticipated that the patient will be medically stable for discharge from the hospital within 2 midnights of admission. The following factors support the patient status of inpatient.   " The patient's presenting symptoms include volume overload, SOB, diastolic HF " The worrisome physical exam findings include LE edema, elevated JVD. " The initial radiographic and laboratory data are worrisome because of pending. " The chronic co-morbidities include HFpEF, CKD, DM, HTN, PAD.   * I certify that at the point of admission it is my clinical judgment that the patient will require inpatient hospital care spanning beyond 2 midnights from the point of admission due to high intensity of service, high risk for further deterioration and high frequency of surveillance required.*    For questions or updates, please contact [place] Please consult [email or web address] for contact info under     Signed, [place] T. Audie Box, [employer]  12/31/2019 5:15 PM

## 2019-12-31 NOTE — Telephone Encounter (Signed)
FYI

## 2019-12-31 NOTE — Telephone Encounter (Signed)
Patient wanted to let Dr, Pamella Pert know that he is beinf admitted to [facility] for Fluid in legs and [place] . Ordered by Dr. Gus Height Heart care .

## 2019-12-31 NOTE — Progress Notes (Signed)
Patient has arrived onto the unit and the patient is alert and oriented x 4. CCMD has been notified and tele monitor has been applied. Patient dinner tray has been ordered,.

## 2020-01-01 ENCOUNTER — Inpatient Hospital Stay (HOSPITAL_COMMUNITY): Payer: Medicare Other

## 2020-01-01 ENCOUNTER — Other Ambulatory Visit: Payer: Medicare Other

## 2020-01-01 DIAGNOSIS — I10 Essential (primary) hypertension: Secondary | ICD-10-CM

## 2020-01-01 DIAGNOSIS — N183 Chronic kidney disease, stage 3 unspecified: Secondary | ICD-10-CM

## 2020-01-01 DIAGNOSIS — R609 Edema, unspecified: Secondary | ICD-10-CM

## 2020-01-01 DIAGNOSIS — E1122 Type 2 diabetes mellitus with diabetic chronic kidney disease: Secondary | ICD-10-CM

## 2020-01-01 DIAGNOSIS — I5031 Acute diastolic (congestive) heart failure: Secondary | ICD-10-CM

## 2020-01-01 DIAGNOSIS — E8770 Fluid overload, unspecified: Secondary | ICD-10-CM

## 2020-01-01 LAB — GLUCOSE, CAPILLARY
Glucose-Capillary: 81 mg/dL (ref 70–99)
Glucose-Capillary: 92 mg/dL (ref 70–99)
Glucose-Capillary: 185 mg/dL — ABNORMAL HIGH (ref 70–99)
Glucose-Capillary: 232 mg/dL — ABNORMAL HIGH (ref 70–99)
Glucose-Capillary: 83 mg/dL (ref 70–99)

## 2020-01-01 LAB — BASIC METABOLIC PANEL
Anion gap: 9 (ref 5–15)
Anion gap: 12 (ref 5–15)
BUN: 26 mg/dL — ABNORMAL HIGH (ref 8–23)
BUN: 27 mg/dL — ABNORMAL HIGH (ref 8–23)
CO2: 28 mmol/L (ref 22–32)
CO2: 29 mmol/L (ref 22–32)
Calcium: 9.2 mg/dL (ref 8.9–10.3)
Calcium: 9.1 mg/dL (ref 8.9–10.3)
Chloride: 100 mmol/L (ref 98–111)
Chloride: 94 mmol/L — ABNORMAL LOW (ref 98–111)
Creatinine, Ser: 2.54 mg/dL — ABNORMAL HIGH (ref 0.61–1.24)
Creatinine, Ser: 2.5 mg/dL — ABNORMAL HIGH (ref 0.61–1.24)
GFR calc Af Amer: 29 mL/min — ABNORMAL LOW (ref >60)
GFR calc Af Amer: 29 mL/min — ABNORMAL LOW (ref >60)
GFR calc non Af Amer: 25 mL/min — ABNORMAL LOW (ref >60)
GFR calc non Af Amer: 25 mL/min — ABNORMAL LOW (ref >60)
Glucose, Bld: 76 mg/dL (ref 70–99)
Glucose, Bld: 214 mg/dL — ABNORMAL HIGH (ref 70–99)
Potassium: 3.8 mmol/L (ref 3.5–5.1)
Potassium: 4.3 mmol/L (ref 3.5–5.1)
Sodium: 137 mmol/L (ref 135–145)
Sodium: 135 mmol/L (ref 135–145)

## 2020-01-01 LAB — HEMOGLOBIN A1C
Hgb A1c MFr Bld: 12.8 % — ABNORMAL HIGH (ref 4.8–5.6)
Mean Plasma Glucose: 321 mg/dL

## 2020-01-01 LAB — ECHOCARDIOGRAM COMPLETE
Height: 66 in
Weight: 2600 oz

## 2020-01-01 MED ORDER — HEPARIN BOLUS VIA INFUSION
4500.0000 [IU] | Freq: Once | INTRAVENOUS | Status: AC
  Administered 2020-01-01: 4500 [IU] via INTRAVENOUS
  Filled 2020-01-01: qty 4500

## 2020-01-01 MED ORDER — ENOXAPARIN SODIUM 30 MG/0.3ML ~~LOC~~ SOLN: 30.0000 mg | SUBCUTANEOUS | Status: DC

## 2020-01-01 MED ORDER — HEPARIN (PORCINE) 25000 UT/250ML-% IV SOLN
1300.0000 [IU]/h | INTRAVENOUS | Status: DC
  Administered 2020-01-01: 1300 [IU]/h via INTRAVENOUS
  Filled 2020-01-01: qty 250

## 2020-01-01 MED ORDER — JUVEN PO PACK
1.0000 | PACK | Freq: Two times a day (BID) | ORAL | Status: DC
  Administered 2020-01-02 – 2020-01-03 (×4): 1 via ORAL
  Filled 2020-01-01 (×3): qty 1

## 2020-01-01 NOTE — Plan of Care (Signed)
  Problem: Clinical Measurements: Goal: Respiratory complications will improve Outcome: Progressing   Problem: Activity: Goal: Risk for activity intolerance will decrease Outcome: Progressing   Problem: Safety: Goal: Ability to remain free from injury will improve Outcome: Progressing   

## 2020-01-01 NOTE — Progress Notes (Signed)
Two unsucceessful attempts for IV access, placed an ordered for IV team to come assess the patient's peripheral veins. IV lasix is on hold until IV access is established.

## 2020-01-01 NOTE — Progress Notes (Signed)
CCMD called and relayed that patient had a 9 beat of nonsustained VTach. Patient is alert and oriented x 4 sitting in the recliner with NT and Lab present. Patient expresses no discomfort or chest pain. Will continue to monitor.

## 2020-01-01 NOTE — TOC Progression Note (Signed)
Transition of Care [facility]) - Progression Note    Patient Details  Name: [name] MRN: [id number] Date of Birth: [birth year]  Transition of [facility]) CM/SW Contact  Zenon Mayo, RN Phone Number: 01/01/2020, 4:29 PM  Clinical Narrative:    NCM received consult for patient having hard time affording insulin , he has insurance, NCM informed Staff RN to consult Diabetes educator , insulin may need to be changed to an affordable insulin.          Expected Discharge Plan and Services                                                 Social Determinants of Health (SDOH) Interventions    Readmission Risk Interventions Readmission Risk Prevention Plan 06/27/2019 05/09/2019  Transportation Screening Complete Complete  PCP or Specialist Appt within 3-5 Days - Complete  HRI or [place] Complete Complete  Social Work Consult for [place] Planning/Counseling Complete Complete  Palliative Care Screening Not Applicable Not Applicable  Medication Review Press photographer) Complete Complete  Some recent data might be hidden

## 2020-01-01 NOTE — Progress Notes (Signed)
  Echocardiogram 2D Echocardiogram has been performed.  [name] 01/01/2020, 2:21 PM

## 2020-01-01 NOTE — Progress Notes (Signed)
[place] for heparin Indication: r/o DVT  Heparin Dosing Weight: 75.4 kg  Labs: Recent Labs    12/31/19 2108 01/01/20 0516  HGB 11.1*  --   HCT 36.2*  --   PLT 209  --   CREATININE 2.65* 2.54*    Assessment: 1 yom admitted with acute CHF, concern for DVT. Pharmacy consulted to dose heparin for VTE treatment. Patient is not on anticoagulation PTA. Hg 11.1, plt wnl, noted AKI. No active bleed issues reported. Patient previously on Lovenox prophylactic dosing this admit - last dose 5/18 PM.  Goal of Therapy:  Heparin level 0.3-0.7 units/ml Monitor platelets by anticoagulation protocol: Yes   Plan:  D/c Lovenox order Heparin 4500 unit bolus Start heparin at 1300 units/h 8h heparin level Monitor daily heparin level and CBC, s/sx bleeding F/u VTE r/o   Elicia Lamp, PharmD, BCPS Clinical Pharmacist 01/01/2020 1:58 PM

## 2020-01-01 NOTE — Progress Notes (Signed)
Initial Nutrition Assessment  DOCUMENTATION CODES:   Not applicable  INTERVENTION:   -MVI with minerals daily -1 packet Juven BID, each packet provides 95 calories, 2.5 grams of protein (collagen), and 9.8 grams of carbohydrate (3 grams sugar); also contains 7 grams of L-arginine and L-glutamine, 300 mg vitamin C, 15 mg vitamin E, 1.2 mcg vitamin B-12, 9.5 mg zinc, 200 mg calcium, and 1.5 g  Calcium Beta-hydroxy-Beta-methylbutyrate to support wound healing -Provided "Heart Healthy/ Consistent Carbohydrate Nutrition Therapy" handout from [facility] Nutrition Care Manual; provided pt with hard copy and attached another copy to AVS/ discharge summary  NUTRITION DIAGNOSIS:   Increased nutrient needs related to wound healing as evidenced by estimated needs.  GOAL:   Patient will meet greater than or equal to 90% of their needs  MONITOR:   PO intake, Supplement acceptance, Labs, Weight trends, Skin, I & O's  REASON FOR ASSESSMENT:   Consult Diet education  ASSESSMENT:   [name] is a 69 y.o. male with a hx of heart failure with preserved ejection fraction, PAD, CKD stage III, uncontrolled diabetes, hypertension, hyperlipidemia who is admitted for acute decompensated diastolic HF.  Pt admitted with CHF, volume overload, and SOB.   Reviewed I/O's: -1.1 L x 24 hours  UOP: 1.6 L x 24 hours  Per CWOCN notes, pt with dry yellow intact callous on lt outer foot and full thickness wound of lt plantar foot with previous blister.   Pt just finished eating lunch at time of visit. Pt familiar to this RD, as RD educated pt extensively during prior hospitalization in November 2020.   Pt pleasant and good spirits, laughing and joking with staff. Pt was very tangential and very difficult to redirect. He reports good appetite, consumed almost all of his lunch tray. Documented meal completion 75%.   Per chart review, pt with difficulty obtaining medications, which is likely contributing to  volume overload and uncontrolled DM (per H&P, pt stopped taking medications approximately one month ago). Case Management consult pending.   RD briefly started education, however, pt had to leave to echocardiogram. Attempted to speak with pt x 3 later in the day, however, unable to reach pt. RD left handouts with pt and asked to review as able. RD will follow-up with education as time allows.   Reviewed wt hx; wt has been stable over the past 6 months.   Medications reviewed and include IV lasix.   Lab Results  Component Value Date   HGBA1C  11/20/2019     Comment:     >15   HGBA1C >15 11/20/2019   PTA DM medications are 16-20 units insulin isophane and regular human insulin before breakfast and supper.   Labs reviewed: CBGS: 81-185 (inpatient orders for glycemic control are 0-15 units insulin aspart TID with meals, 0-5 units insulin aspart q HS, and 10 units insulin aspart protamine-aspart BID).   NUTRITION - FOCUSED PHYSICAL EXAM:    Most Recent Value  Orbital Region  No depletion  Upper Arm Region  No depletion  Thoracic and Lumbar Region  No depletion  Buccal Region  No depletion  Temple Region  No depletion  Clavicle Bone Region  No depletion  Clavicle and Acromion Bone Region  No depletion  Scapular Bone Region  No depletion  Dorsal Hand  No depletion  Patellar Region  No depletion  Anterior Thigh Region  No depletion  Posterior Calf Region  No depletion  Edema (RD Assessment)  Mild  Hair  Reviewed  Eyes  Reviewed  Mouth  Reviewed  Skin  Reviewed  Nails  Reviewed       Diet Order:   Diet Order            Diet 2 gram sodium Room service appropriate? Yes; Fluid consistency: Thin  Diet effective now              EDUCATION NEEDS:   Education needs have been addressed  Skin:  Skin Assessment: Skin Integrity Issues: Skin Integrity Issues:: Other (Comment) Other: dry yellow intact callous on lt outer foot and full thickness wound of lt plantar foot with  previous blister  Last BM:  12/31/19  Height:   Ht Readings from Last 1 Encounters:  12/31/19 5\' 6"  (1.676 m)    Weight:   Wt Readings from Last 1 Encounters:  01/01/20 73.7 kg    Ideal Body Weight:  64.5 kg  BMI:  Body mass index is 26.23 kg/m.  Estimated Nutritional Needs:   Kcal:  1900-2100  Protein:  95-110 grams  Fluid:  > 1.9 L    Loistine Chance, RD, LDN, [place] Registered Dietitian II Certified Diabetes Care and Education Specialist Please refer to [facility] for RD and/or RD on-call/weekend/after hours pager

## 2020-01-01 NOTE — Progress Notes (Addendum)
IV RN on the floor to insert PIV. Informed this RN to put in new order for PIV in am as pts. IV Lasix is due at 8am.

## 2020-01-01 NOTE — Progress Notes (Deleted)
RN notified of 7 beat run of Vtach by CCMD. Pt. Alert and stable. No distress noted. Pt. Denies discomfort. VSS. Pt. Resting in bed. RN will continue to monitor.

## 2020-01-01 NOTE — Consult Note (Signed)
[facility] Nurse Consult Note: Reason for Consult: Consult requested for left foot.  Pt is familiar to the [facility] team from a previous admission.  He states he has been followed by the outpatient wound care center in the past and is also due to have orthotics made at an orthopedic clinic soon.  Wound type: Left outer foot with dry yellow intact callous; 1X1cm; no open wound, fluctuance, or drainage Left plantar foot with previous blister which has ruptured and evolved into a full thickness wound; blistered edges involve an entire area which is 4.5X5.5cm, yellow moist edges below skin level and dry and calloused on the skin level.  In the center is a full thickness wound; 1X.8X.3cm, red and moist, small amt tan drainage, no odor.  Dressing procedure/placement/frequency: Topical treatment orders provided for bedside nurses to perform 3 times a week as follows to promote moist healing and protect left outer foot from further injury:  Change dressing to left plantar foot Q Mon/Wed/Fri as follows:  Apply moist gauze 2X2 packing in wound, then cover with 4X4s and kerlex.  Pad outer foot with 4X4 over foam dressing.  Hold in place with kerlex. Pt should resume follow-up with the outpatient wound care center after discharge.  Please re-consult if further assistance is needed.  Thank-you,  Julien Girt MSN, [place], [place], [place], [place]

## 2020-01-01 NOTE — Progress Notes (Signed)
Lower venous duplex       has been completed. Preliminary results can be found under CV proc through chart review. Shields Pautz, BS, RDMS, RVT   

## 2020-01-01 NOTE — Plan of Care (Signed)

## 2020-01-01 NOTE — Discharge Instructions (Signed)

## 2020-01-01 NOTE — Progress Notes (Signed)
PT. CBG 81. Pt. Diaphotic. Carbs given. Will monitor.

## 2020-01-01 NOTE — Progress Notes (Addendum)
The patient has been seen in conjunction with Sande Rives, PA-C. All aspects of care have been considered and discussed. The patient has been personally interviewed, examined, and all clinical data has been reviewed.   Kidney function is worse than prior data when hemodynamically stable.  Did not get Lasix until earlier this morning because of IV access issues.  Left>Right edema. Left leg is warm. DVT needs to be excluded. PE could be causing dyspnea.  Will follow kidney function closely.  Use IV Lasix to challenge kidneys and diurese the patient.   Progress Note  Patient Name: [name] Date of Encounter: 01/01/2020  Primary Cardiologist: Evalina Field, MD   Subjective   Patient admitted from office yesterday for acute CHF. Unfortunately, there was trouble getting IV access last night and so IV Lasix was just started. He notes intermittent "labored" breathing. He describes a very vague chest discomfort that he describes as "tickling" sensation. No angina.   Inpatient Medications    Scheduled Meds: . amLODipine  10 mg Oral Daily  . aspirin  81 mg Oral q morning - 10a  . atorvastatin  40 mg Oral q morning - 10a  . clopidogrel  75 mg Oral Q breakfast  . enoxaparin (LOVENOX) injection  30 mg Subcutaneous Q24H  . gabapentin  300 mg Oral BID  . insulin aspart  0-15 Units Subcutaneous TID WC  . insulin aspart  0-5 Units Subcutaneous QHS  . insulin aspart protamine- aspart  10 Units Subcutaneous BID AC  . multivitamin with minerals  1 tablet Oral Q breakfast  . sodium chloride flush  3 mL Intravenous Q12H   Continuous Infusions: . sodium chloride    . furosemide     PRN Meds: sodium chloride, acetaminophen, ALPRAZolam, docusate sodium, nitroGLYCERIN, ondansetron (ZOFRAN) IV, polyvinyl alcohol, sodium chloride flush, zolpidem   Vital Signs    Vitals:   01/01/20 0757 01/01/20 0757 01/01/20 1005 01/01/20 1130  BP: 106/86  (!) 155/70 (!) 154/60  Pulse:  (!) 54 61  (!) 58  Resp:  18 16 18   Temp:  98.2 F (36.8 C) 97.7 F (36.5 C) (!) 97.3 F (36.3 C)  TempSrc:  Oral Oral Oral  SpO2:  97% 100% 97%  Weight:      Height:        Intake/Output Summary (Last 24 hours) at 01/01/2020 1217 Last data filed at 01/01/2020 1005 Gross per 24 hour  Intake 600 ml  Output 1575 ml  Net -975 ml   Last 3 Weights 01/01/2020 12/31/2019 12/31/2019  Weight (lbs) 162 lb 8 oz 166 lb 3.2 oz 169 lb 3.2 oz  Weight (kg) 73.71 kg 75.388 kg 76.749 kg      Telemetry    Normal sinus rhythm with rates ranging from the 50's to 80's. - Personally Reviewed  ECG    No new ECG tracing today. - Personally Reviewed  Physical Exam   GEN: No acute distress.   Neck: Supple. No obvious JVD with patient sitting completely upright. Cardiac: RRR. No murmurs, rubs, or gallops.  Respiratory: Clear to auscultation bilaterally. GI: Soft, non-distended, and non-tender. MS: 2+ pitting edema of bilateral lower extremities (left > right). No deformity. Skin: Warm and dry. Neuro:  No focal deficits. Psych: Normal affect   Labs    High Sensitivity Troponin:  No results for input(s): TROPONINIHS in the last 720 hours.    Chemistry Recent Labs  Lab 12/26/19 1046 12/31/19 2108 01/01/20 0516  NA 138 138  137  K 5.5* 5.3* 3.8  CL 99 95* 100  CO2 23 30 28   GLUCOSE 182* 271* 76  BUN 15 26* 26*  CREATININE 2.00* 2.65* 2.54*  CALCIUM 9.4 9.6 9.2  PROT  --  7.2  --   ALBUMIN  --  3.3*  --   AST  --  25  --   ALT  --  21  --   ALKPHOS  --  110  --   BILITOT  --  1.2  --   GFRNONAA 33* 23* 25*  GFRAA 38* 27* 29*  ANIONGAP  --  13 9     Hematology Recent Labs  Lab 12/31/19 2108  WBC 3.8*  RBC 5.35  HGB 11.1*  HCT 36.2*  MCV 67.7*  MCH 20.7*  MCHC 30.7  RDW 15.1  PLT 209    BNP Recent Labs  Lab 12/31/19 2108  BNP 168.3*     DDimer No results for input(s): DDIMER in the last 168 hours.   Radiology    DG Chest 2 View  Result Date: 12/31/2019 CLINICAL DATA:   Lower extremity swelling EXAM: CHEST - 2 VIEW COMPARISON:  06/25/2019 FINDINGS: The heart size and mediastinal contours are within normal limits. Both lungs are clear. The visualized skeletal structures are unremarkable. IMPRESSION: No active cardiopulmonary disease. Electronically Signed   By: Donavan Foil M.D.   On: 12/31/2019 20:37    Cardiac Studies   Echo pending.  Patient Profile     69 y.o. male with a history of chronic diastolic CHF, PAD s/p left anterior tibial angioplasty in 05/2019, 1st degree AV block, hypertension, hyperlipidemia, uncontrolled diabetes mellitus, CKD stage III, and pulmonary sarcoidosis who was admitted from the office on 12/31/2019 for acute on chronic diastolic CHF that did not respond to increase in outpatient therapy.   Assessment & Plan    Acute on Chronic Diastolic CHF - Chest x-ray showed no overt edema. - BNP mildly elevated at 168.  - LVEF of 60-65% in 04/2019. Repeat Echo pending.  - Dr. Audie Box ordered IV Lasix 120mg  twice daily. Unfortunately, there was trouble getting IV access last night so first dose is just now getting started.  - Lower extremity dopplers also ordered to exclude DVT due to asymetric lower extremity edema (left > right) - Will need to monitor renal function closely. Continue to monitor daily weights and strict I/O's.  - Will ask Nutritionist to come and speak with patient as it sounds like patient is non-compliant with low sodium diet.   PAD - S/p left anterior tibial angioplasty in 05/2019.  - Continue Aspirin, Plavix, statin.  Hypertension - BP elevated. - Continue Amlodipine 10mg  daily.  - Suspect BP will improve some with diuresis.  Hyperlipidemia - Continue home Lipitor 40mg  daily.   Uncontrolled Diabetes Mellitus - Hemoglobin A1c 13.0. He has been off insulin at home due to trouble affording it.  - Placed on sliding scale insulin here.  - Will consult Diabetes Coordinator for assistance. Will also consult Case  Management to about with affording insulin.   CKD Stage IV - Creatinine 2.54 today. Baseline around 1.7 to 2.0.  - Continue to monitor closely with diuresis.  - Will recheck BMET later today to make sure creatinine and potassium are stable.  Hyperkalemia - Potassium 5.3 yesterday but 3.8 today.  - Continue to monitor closely.   For questions or updates, please contact [place] Please consult [email or web address] for contact info under  Signed, Darreld Mclean, PA-C  01/01/2020, 12:17 PM

## 2020-01-01 NOTE — Plan of Care (Signed)
°  Problem: Clinical Measurements: °Goal: Will remain free from infection °Outcome: Progressing °  °Problem: Activity: °Goal: Risk for activity intolerance will decrease °Outcome: Progressing °  °Problem: Nutrition: °Goal: Adequate nutrition will be maintained °Outcome: Progressing °  °Problem: Elimination: °Goal: Will not experience complications related to bowel motility °Outcome: Progressing °  °

## 2020-01-02 LAB — BASIC METABOLIC PANEL
Anion gap: 11 (ref 5–15)
BUN: 27 mg/dL — ABNORMAL HIGH (ref 8–23)
CO2: 31 mmol/L (ref 22–32)
Calcium: 9.3 mg/dL (ref 8.9–10.3)
Chloride: 96 mmol/L — ABNORMAL LOW (ref 98–111)
Creatinine, Ser: 2.65 mg/dL — ABNORMAL HIGH (ref 0.61–1.24)
GFR calc Af Amer: 27 mL/min — ABNORMAL LOW (ref >60)
GFR calc non Af Amer: 23 mL/min — ABNORMAL LOW (ref >60)
Glucose, Bld: 110 mg/dL — ABNORMAL HIGH (ref 70–99)
Potassium: 4.6 mmol/L (ref 3.5–5.1)
Sodium: 138 mmol/L (ref 135–145)

## 2020-01-02 LAB — GLUCOSE, CAPILLARY
Glucose-Capillary: 95 mg/dL (ref 70–99)
Glucose-Capillary: 116 mg/dL — ABNORMAL HIGH (ref 70–99)
Glucose-Capillary: 62 mg/dL — ABNORMAL LOW (ref 70–99)
Glucose-Capillary: 170 mg/dL — ABNORMAL HIGH (ref 70–99)
Glucose-Capillary: 139 mg/dL — ABNORMAL HIGH (ref 70–99)

## 2020-01-02 MED ORDER — CEFAZOLIN SODIUM-DEXTROSE 1-4 GM/50ML-% IV SOLN
1.0000 g | Freq: Two times a day (BID) | INTRAVENOUS | Status: DC
  Administered 2020-01-02 (×2): 1 g via INTRAVENOUS
  Filled 2020-01-02 (×4): qty 50

## 2020-01-02 MED ORDER — HYDRALAZINE HCL 25 MG PO TABS
25.0000 mg | ORAL_TABLET | Freq: Three times a day (TID) | ORAL | Status: DC
  Administered 2020-01-02 – 2020-01-03 (×4): 25 mg via ORAL
  Filled 2020-01-02 (×4): qty 1

## 2020-01-02 NOTE — Progress Notes (Signed)
Brief Nutrition Follow-Up Note  RD briefly assessed pt yesterday; please refer to progress note dated 01/01/20 for further details.   Attempted to revisit pt to reinforce diet restrictions, however, pt receiving nursing care at time of visit. Per DM coordinator note, pt unable to afford medications and stopped taking them due to this. Case management consult pending for benefits check on medications. Diet is high in baked proteins and fresh fruits and vegetables. Highly suspect volume overload and uncontrolled DM is due to inability to access medications.   Pt has both hard copy of educational handouts and an additional copy in AVS/ discharge summary. RD contact information provided in the event that pt has additional questions or concerns.   Loistine Chance, RD, LDN, [place] Registered Dietitian II Certified Diabetes Care and Education Specialist Please refer to [facility] for RD and/or RD on-call/weekend/after hours pager

## 2020-01-02 NOTE — Progress Notes (Signed)
Inpatient Diabetes Program Recommendations  AACE/ADA: New Consensus Statement on Inpatient Glycemic Control (2015)  Target Ranges:  Prepandial:   less than 140 mg/dL      Peak postprandial:   less than 180 mg/dL (1-2 hours)      Critically ill patients:  140 - 180 mg/dL   Lab Results  Component Value Date   GLUCAP 116 (H) 01/02/2020   HGBA1C 12.8 (H) 12/31/2019    Review of Glycemic Control Results for [name], [name] (MRN [id number]) as of 01/02/2020 11:21  Ref. Range 01/01/2020 16:16 01/01/2020 22:05 01/02/2020 06:07 01/02/2020 10:46 01/02/2020 11:17  Glucose-Capillary Latest Ref Range: 70 - 99 mg/dL 232 (H) 83 95 62 (L) 116 (H)   Results for [name], [name] (MRN [id number]) as of 01/02/2020 11:21  Ref. Range 11/20/2019 08:52 12/31/2019 21:08  Hemoglobin A1C Latest Ref Range: 4.8 - 5.6 % >15 12.8 (H)   Diabetes history:  DM2  Outpatient Diabetes medications:  70/30 16-20 units BID (has not been taking for several months)  Current orders for Inpatient glycemic control:  Novolog 0-15 units tid + 0-5 qhs + 70/30 10 units BID  Note: Spoke with patient at bedside.  He was eating his breakfast around 10:45 and stated he was feeling weak.  Notified RN and CBG was 62 mg/dl.  Likely low due to morning insulin dose of 70/30 given at 0640 and not with his meal.    Patient is current with [name] for PCP.  He has not worked in several months because of his foot ulcer and outbreak of COVID at his facility.  He on short term disability but this ran out a couple of months ago.  He has difficulty affording his insulins and has had his insulin for several months.   Will contact financial counseling for assistance with disability information.  Will also ask TOC to speak with patient and run a benefits check for insulin.  If he does not have prescription drug coverage he may qualify for medication assistance with [place].    Patient states he has not been checking his blood sugar.  His meter is very  old and he does not have strips for it.  MD please prescribe glucometer at discharge-Order # [id number].  Encouraged him to check his CBG's every morning before breakfast, mid afternoon and at bedtime.  If his blood sugar is below 100 mg/dl he should have a small snack.  He should call his PCP for consistent CBG's <100 or >200 mg/dl.  Reviewed hypoglycemia, symptoms and treatment.    His A1C has decreased over the last several weeks from 15% to 12.8%.  His wife has adjusted his diet to more fresh vegetables and lean meats and fish.  He does not drink any sugary drinks.  Explained he does need to start taking his insulins.  Discussed long term and short term complications of uncontrolled blood sugars.  All questions answered and will continue to follow.  Thank you, Reche Dixon, RN, BSN Diabetes Coordinator Inpatient Diabetes Program [phone] (team pager from 8a-5p)

## 2020-01-02 NOTE — TOC Benefit Eligibility Note (Signed)
Transition of Care [facility]) Benefit Eligibility Note    Patient Details  Name: [name] MRN: [id number] Date of Birth: 12-25-1950   Medication/Dose: 70/30 INSULIN  PEN OR VIAL  Covered?: Yes  Tier: 3 Drug  Prescription Coverage Preferred Pharmacy: [facility]  and   [facility]  Spoke with Person/Company/Phone Number:: MARIE  @  [employer] # [phone]  Co-Pay: $47.00  Prior Approval: No  Deductible: Unmet(OUT-OF-POCKET : [place] Phone Number: 01/02/2020, 12:50 PM

## 2020-01-02 NOTE — Progress Notes (Addendum)
.   The patient has been seen in conjunction with Vin Bhagat, PAC. All aspects of care have been considered and discussed. The patient has been personally interviewed, examined, and all clinical data has been reviewed.   Kidney function is worse than baseline.  Breathing is back to baseline.  Neck veins are flat.  Lungs are clear.  Left lower extremity edema has improved but left lower extremity still swollen and somewhat warm.  Given findings of the [place] "Left plantar foot with previous blister which has ruptured and evolved into a full thickness wound" I am concerned that persistent swelling may be related to infection and will add an appropriate antibiotic.  Will discontinue IV Lasix and repeat laboratory data in a.m. to make a decision about oral diuretic regimen.  Probable discharge tomorrow depending upon kidney function.  Will need follow-up in the wound clinic.   Progress Note  Patient Name: [name] Date of Encounter: 01/02/2020  Primary Cardiologist: Evalina Field, MD   Subjective   Breathing improving.  Not using urinal plan. So I & O inaccurate.   Inpatient Medications    Scheduled Meds: . amLODipine  10 mg Oral Daily  . aspirin  81 mg Oral q morning - 10a  . atorvastatin  40 mg Oral q morning - 10a  . clopidogrel  75 mg Oral Q breakfast  . gabapentin  300 mg Oral BID  . hydrALAZINE  25 mg Oral Q8H  . insulin aspart  0-15 Units Subcutaneous TID WC  . insulin aspart  0-5 Units Subcutaneous QHS  . insulin aspart protamine- aspart  10 Units Subcutaneous BID AC  . multivitamin with minerals  1 tablet Oral Q breakfast  . nutrition supplement (JUVEN)  1 packet Oral BID BM  . sodium chloride flush  3 mL Intravenous Q12H   Continuous Infusions: . sodium chloride    . furosemide 100 mg (01/02/20 0853)   PRN Meds: sodium chloride, acetaminophen, ALPRAZolam, docusate sodium, nitroGLYCERIN, ondansetron (ZOFRAN) IV, polyvinyl alcohol, sodium chloride flush,  zolpidem   Vital Signs    Vitals:   01/02/20 0025 01/02/20 0430 01/02/20 0528 01/02/20 0729  BP: (!) 140/59  (!) 146/66 (!) 155/62  Pulse: (!) 55  (!) 56 64  Resp: 14  17 18   Temp: 97.6 F (36.4 C)  (!) 97.4 F (36.3 C) (!) 97.4 F (36.3 C)  TempSrc: Oral  Oral Oral  SpO2: 97%  100% 100%  Weight:  73.4 kg    Height:        Intake/Output Summary (Last 24 hours) at 01/02/2020 0937 Last data filed at 01/02/2020 0300 Gross per 24 hour  Intake 1132.09 ml  Output 900 ml  Net 232.09 ml   Last 3 Weights 01/02/2020 01/01/2020 12/31/2019  Weight (lbs) 161 lb 14.4 oz 162 lb 8 oz 166 lb 3.2 oz  Weight (kg) 73.437 kg 73.71 kg 75.388 kg      Telemetry    Sinus bradycardia/rhythm at rate of 50-60 with intermittent bradycardic pain high 30s- Personally Reviewed  ECG    No new tracing  Physical Exam   GEN: No acute distress.   Neck:  JVD difficult to assess due to beard Cardiac: RRR, no murmurs, rubs, or gallops.  Respiratory: Clear to auscultation bilaterally. GI: Soft, nontender, non-distended  MS: 1+ RLE and 2+ LLE edema; No deformity. Neuro:  Nonfocal  Psych: Normal affect   Labs    High Sensitivity Troponin:  No results for input(s): TROPONINIHS in  the last 720 hours.    Chemistry Recent Labs  Lab 12/31/19 2108 12/31/19 2108 01/01/20 0516 01/01/20 1644 01/02/20 0534  NA 138   < > 137 135 138  K 5.3*   < > 3.8 4.3 4.6  CL 95*   < > 100 94* 96*  CO2 30   < > 28 29 31   GLUCOSE 271*   < > 76 214* 110*  BUN 26*   < > 26* 27* 27*  CREATININE 2.65*   < > 2.54* 2.50* 2.65*  CALCIUM 9.6   < > 9.2 9.1 9.3  PROT 7.2  --   --   --   --   ALBUMIN 3.3*  --   --   --   --   AST 25  --   --   --   --   ALT 21  --   --   --   --   ALKPHOS 110  --   --   --   --   BILITOT 1.2  --   --   --   --   GFRNONAA 23*   < > 25* 25* 23*  GFRAA 27*   < > 29* 29* 27*  ANIONGAP 13   < > 9 12 11    < > = values in this interval not displayed.     Hematology Recent Labs  Lab  12/31/19 2108  WBC 3.8*  RBC 5.35  HGB 11.1*  HCT 36.2*  MCV 67.7*  MCH 20.7*  MCHC 30.7  RDW 15.1  PLT 209    BNP Recent Labs  Lab 12/31/19 2108  BNP 168.3*     DDimer No results for input(s): DDIMER in the last 168 hours.   Radiology    DG Chest 2 View  Result Date: 12/31/2019 CLINICAL DATA:  Lower extremity swelling EXAM: CHEST - 2 VIEW COMPARISON:  06/25/2019 FINDINGS: The heart size and mediastinal contours are within normal limits. Both lungs are clear. The visualized skeletal structures are unremarkable. IMPRESSION: No active cardiopulmonary disease. Electronically Signed   By: Donavan Foil M.D.   On: 12/31/2019 20:37   ECHOCARDIOGRAM COMPLETE  Result Date: 01/01/2020    ECHOCARDIOGRAM REPORT   Patient Name:   [name] Date of Exam: 01/01/2020 Medical Rec #:  [id number]        Height:       66.0 in Accession #:    [id number]       Weight:       162.5 lb Date of Birth:  1950-09-04        BSA:          1.831 m Patient Age:    69 years         BP:           159/60 mmHg Patient Gender: M                HR:           58 bpm. Exam Location:  Inpatient Procedure: 2D Echo, Cardiac Doppler and Color Doppler Indications:    CHF  History:        Patient has prior history of Echocardiogram examinations, most                 recent 05/06/2019. CHF, PAD, Arrythmias:1st degree AV block; Risk                 Factors:Hypertension, Dyslipidemia and Diabetes. CKD,.  Sonographer:    Dustin Flock Referring Phys: [place]  1. Left ventricular ejection fraction, by estimation, is 60 to 65%. The left ventricle has normal function. The left ventricle has no regional wall motion abnormalities. There is mild left ventricular hypertrophy. Left ventricular diastolic parameters are consistent with Grade II diastolic dysfunction (pseudonormalization). Elevated left ventricular end-diastolic pressure.  2. Right ventricular systolic function is normal. The right ventricular  size is normal.  3. The mitral valve is normal in structure. Mild mitral valve regurgitation. No evidence of mitral stenosis.  4. The aortic valve is tricuspid. Aortic valve regurgitation is mild. No aortic stenosis is present.  5. The inferior vena cava is normal in size with greater than 50% respiratory variability, suggesting right atrial pressure of 3 mmHg. FINDINGS  Left Ventricle: Left ventricular ejection fraction, by estimation, is 60 to 65%. The left ventricle has normal function. The left ventricle has no regional wall motion abnormalities. The left ventricular internal cavity size was normal in size. There is  mild left ventricular hypertrophy. Left ventricular diastolic parameters are consistent with Grade II diastolic dysfunction (pseudonormalization). Elevated left ventricular end-diastolic pressure. Right Ventricle: The right ventricular size is normal. No increase in right ventricular wall thickness. Right ventricular systolic function is normal. Left Atrium: Left atrial size was normal in size. Right Atrium: Right atrial size was normal in size. Pericardium: There is no evidence of pericardial effusion. Mitral Valve: The mitral valve is normal in structure. There is mild thickening of the mitral valve leaflet(s). Normal mobility of the mitral valve leaflets. Mild mitral valve regurgitation. No evidence of mitral valve stenosis. Tricuspid Valve: The tricuspid valve is normal in structure. Tricuspid valve regurgitation is trivial. No evidence of tricuspid stenosis. Aortic Valve: The aortic valve is tricuspid. Aortic valve regurgitation is mild. No aortic stenosis is present. Pulmonic Valve: The pulmonic valve was normal in structure. Pulmonic valve regurgitation is mild. No evidence of pulmonic stenosis. Aorta: The aortic root is normal in size and structure. Venous: The inferior vena cava is normal in size with greater than 50% respiratory variability, suggesting right atrial pressure of 3 mmHg.  IAS/Shunts: No atrial level shunt detected by color flow Doppler.  LEFT VENTRICLE PLAX 2D LVIDd:         4.10 cm  Diastology LVIDs:         3.00 cm  LV e' lateral:   6.20 cm/s LV PW:         1.20 cm  LV E/e' lateral: 14.9 LV IVS:        1.20 cm  LV e' medial:    5.44 cm/s LVOT diam:     2.10 cm  LV E/e' medial:  16.9 LV SV:         63 LV SV Index:   35 LVOT Area:     3.46 cm  RIGHT VENTRICLE RV Basal diam:  2.90 cm RV S prime:     6.31 cm/s TAPSE (M-mode): 2.0 cm LEFT ATRIUM             Index       RIGHT ATRIUM           Index LA diam:        3.70 cm 2.02 cm/m  RA Area:     10.30 cm LA Vol (A2C):   29.2 ml 15.95 ml/m RA Volume:   21.30 ml  11.63 ml/m LA Vol (A4C):   36.4 ml 19.88 ml/m LA Biplane Vol:  33.7 ml 18.41 ml/m  AORTIC VALVE LVOT Vmax:   84.90 cm/s LVOT Vmean:  57.300 cm/s LVOT VTI:    0.183 m  AORTA Ao Root diam: 3.00 cm MITRAL VALVE MV Area (PHT): 4.15 cm    SHUNTS MV Decel Time: 183 msec    Systemic VTI:  0.18 m MV E velocity: 92.10 cm/s  Systemic Diam: 2.10 cm MV A velocity: 68.60 cm/s MV E/A ratio:  1.34 Jenkins Rouge MD Electronically signed by Jenkins Rouge MD Signature Date/Time: 01/01/2020/3:49:03 PM    Final    VAS [place] LOWER EXTREMITY VENOUS (DVT)  Result Date: 01/01/2020  Lower Venous DVTStudy Indications: Swelling, and Erythema.  Limitations: Edema. Comparison Study: 05/16/19 negative Performing Technologist: June Leap RDMS, RVT  Examination Guidelines: A complete evaluation includes B-mode imaging, spectral Doppler, color Doppler, and power Doppler as needed of all accessible portions of each vessel. Bilateral testing is considered an integral part of a complete examination. Limited examinations for reoccurring indications may be performed as noted. The reflux portion of the exam is performed with the patient in reverse Trendelenburg.  +-----+---------------+---------+-----------+----------+--------------+ RIGHTCompressibilityPhasicitySpontaneityPropertiesThrombus Aging  +-----+---------------+---------+-----------+----------+--------------+ CFV  Full           Yes      Yes                                 +-----+---------------+---------+-----------+----------+--------------+   +---------+---------------+---------+-----------+----------+-------------------+ LEFT     CompressibilityPhasicitySpontaneityPropertiesThrombus Aging      +---------+---------------+---------+-----------+----------+-------------------+ CFV      Full           Yes      Yes                                      +---------+---------------+---------+-----------+----------+-------------------+ SFJ      Full                                                             +---------+---------------+---------+-----------+----------+-------------------+ FV Prox  Full                                                             +---------+---------------+---------+-----------+----------+-------------------+ FV Mid   Full                                                             +---------+---------------+---------+-----------+----------+-------------------+ FV DistalFull                                                             +---------+---------------+---------+-----------+----------+-------------------+ PFV      Full                                                             +---------+---------------+---------+-----------+----------+-------------------+  POP      Full           Yes      Yes                                      +---------+---------------+---------+-----------+----------+-------------------+ PTV      Full                    Yes                  not well visualized +---------+---------------+---------+-----------+----------+-------------------+ PERO     Full                                         not well visualized +---------+---------------+---------+-----------+----------+-------------------+     Summary: RIGHT: - No  evidence of common femoral vein obstruction.  LEFT: - There is no evidence of deep vein thrombosis in the lower extremity.  - No cystic structure found in the popliteal fossa.  *See table(s) above for measurements and observations. Electronically signed by Harold Barban MD on 01/01/2020 at 4:52:44 PM.    Final     Cardiac Studies   Echo 01/01/20 MPRESSIONS  1. Left ventricular ejection fraction, by estimation, is 60 to 65%. The left ventricle has normal function. The left ventricle has no regional wall motion abnormalities. There is mild left ventricular hypertrophy. Left ventricular diastolic parameters are consistent with Grade II diastolic dysfunction (pseudonormalization). Elevated left ventricular end-diastolic pressure.  2. Right ventricular systolic function is normal. The right ventricular size is normal.  3. The mitral valve is normal in structure. Mild mitral valve regurgitation. No evidence of mitral stenosis.  4. The aortic valve is tricuspid. Aortic valve regurgitation is mild. No aortic stenosis is present.  5. The inferior vena cava is normal in size with greater than 50% respiratory variability, suggesting right atrial pressure of 3 mmHg.  Patient Profile     69 y.o. male with a history of chronic diastolic CHF, PAD s/p left anterior tibial angioplasty in 05/2019, 1st degree AV block, hypertension, hyperlipidemia, uncontrolled diabetes mellitus, CKD stage III, and pulmonary sarcoidosis who was admitted from the office on 12/31/2019 for acute on chronic diastolic CHF that did not respond to increase in outpatient therapy.   Assessment & Plan    1.  Acute on chronic diastolic heart failure -BNP 168.  Chest x-ray without overt edema.  Echocardiogram showed LV function of 60 to 65% and grade 2 diastolic dysfunction. -Initially had IV access now on IV Lasix 120 mg twice daily>> I & O not been accurately recorded.  Patient reports improvement in breathing. -Continue diuresis and watch renal  function very closely - Add hydralazine for afterload reduction - No ACE/ARB due to CKD - No BB due to baseline bradycardia - ? Amlodipine causing LE edema however would no cause asymmetric edema.  No DVT on Doppler.  2.  Acute on chronic kidney disease stage IV -Baseline Creatinine 1.7 to 2 - Scr increased to 2.65  To 2.5   3.  Hypertension -Elevated -Continue amlodipine 10 mg daily -Add hydralazine as above  4.  Uncontrolled diabetes mellitus -Hemoglobin A1c 13 -Cannot afford home insulin >> CM help -Seen by nutritionist   For questions or updates, please contact [facility] Please consult [email or web address] for contact info  under        SignedLeanor Kail, PA  01/02/2020, 9:37 AM

## 2020-01-03 ENCOUNTER — Ambulatory Visit: Payer: Medicare Other | Admitting: Family Medicine

## 2020-01-03 ENCOUNTER — Telehealth: Payer: Self-pay | Admitting: Cardiovascular Disease

## 2020-01-03 DIAGNOSIS — R609 Edema, unspecified: Secondary | ICD-10-CM

## 2020-01-03 DIAGNOSIS — E11319 Type 2 diabetes mellitus with unspecified diabetic retinopathy without macular edema: Secondary | ICD-10-CM

## 2020-01-03 DIAGNOSIS — E1165 Type 2 diabetes mellitus with hyperglycemia: Secondary | ICD-10-CM

## 2020-01-03 LAB — GLUCOSE, CAPILLARY
Glucose-Capillary: 172 mg/dL — ABNORMAL HIGH (ref 70–99)
Glucose-Capillary: 145 mg/dL — ABNORMAL HIGH (ref 70–99)

## 2020-01-03 LAB — BASIC METABOLIC PANEL
Anion gap: 9 (ref 5–15)
BUN: 32 mg/dL — ABNORMAL HIGH (ref 8–23)
CO2: 32 mmol/L (ref 22–32)
Calcium: 9.2 mg/dL (ref 8.9–10.3)
Chloride: 95 mmol/L — ABNORMAL LOW (ref 98–111)
Creatinine, Ser: 2.63 mg/dL — ABNORMAL HIGH (ref 0.61–1.24)
GFR calc Af Amer: 27 mL/min — ABNORMAL LOW (ref >60)
GFR calc non Af Amer: 24 mL/min — ABNORMAL LOW (ref >60)
Glucose, Bld: 180 mg/dL — ABNORMAL HIGH (ref 70–99)
Potassium: 4.3 mmol/L (ref 3.5–5.1)
Sodium: 136 mmol/L (ref 135–145)

## 2020-01-03 MED ORDER — TORSEMIDE 20 MG PO TABS: 20.0000 mg | ORAL_TABLET | Freq: Every day | ORAL | 3 refills | Status: DC

## 2020-01-03 MED ORDER — DOXYCYCLINE HYCLATE 50 MG PO CAPS
100.0000 mg | ORAL_CAPSULE | Freq: Two times a day (BID) | ORAL | 0 refills | Status: AC
Start: 2020-01-03 — End: 2020-01-17

## 2020-01-03 MED ORDER — SODIUM CHLORIDE 0.9 % IV SOLN
2.0000 g | INTRAVENOUS | Status: DC
  Administered 2020-01-03: 2 g via INTRAVENOUS
  Filled 2020-01-03: qty 2

## 2020-01-03 MED ORDER — VANCOMYCIN HCL 750 MG/150ML IV SOLN
750.0000 mg | INTRAVENOUS | Status: DC
  Administered 2020-01-03: 750 mg via INTRAVENOUS
  Filled 2020-01-03: qty 150

## 2020-01-03 MED ORDER — INSULIN PEN NEEDLE 32G X 4 MM MISC: 1.0000 | 1 refills | Status: AC

## 2020-01-03 MED ORDER — BLOOD GLUCOSE METER KIT: PACK | 0 refills | Status: AC

## 2020-01-03 MED ORDER — HYDRALAZINE HCL 25 MG PO TABS: 25.0000 mg | ORAL_TABLET | Freq: Three times a day (TID) | ORAL | 6 refills | Status: DC

## 2020-01-03 MED ORDER — NOVOLIN 70/30 FLEXPEN RELION (70-30) 100 UNIT/ML ~~LOC~~ SUPN: 10.0000 [IU] | PEN_INJECTOR | Freq: Two times a day (BID) | SUBCUTANEOUS | 1 refills | Status: DC

## 2020-01-03 MED ORDER — CEFDINIR 300 MG PO CAPS
300.0000 mg | ORAL_CAPSULE | Freq: Every day | ORAL | 0 refills | Status: AC
Start: 2020-01-03 — End: 2020-01-17

## 2020-01-03 MED FILL — CEFDINIR 300 MG CAPSULE: 300 | 14 days supply | Qty: 14 | Fill #0

## 2020-01-03 MED FILL — DOXYCYCLINE HYCLATE 100 MG: 100 | 14 days supply | Qty: 28 | Fill #0

## 2020-01-03 NOTE — Telephone Encounter (Signed)
TOC per Vin Bhagat with Kerin Ransom on 01/08/20 at 2:15pm

## 2020-01-03 NOTE — Discharge Summary (Addendum)
The patient has been seen in conjunction with Vin Bhagat, PAC. All aspects of care have been considered and discussed. The patient has been personally interviewed, examined, and all clinical data has been reviewed.  We have great concern about the long-term outlook for this patient due to medication noncompliance, poorly controlled diabetes, PAD, recurrent left lower extremity ulcer with associated pain and warmth suggesting cellulitis or deeper infection. Diuretic therapy was reinstituted.  Limitations occurred due to CKD stage IV.  Discharge diuretic regimen was torsemide 20 mg daily based on the absence of consistent diuretic use as an outpatient.  He will have a basic metabolic panel performed at [facility] in 7 days. He has been arranged to follow-up in the wound clinic.  He was seen by the [facility] nurse who gave recommendations for local management.  Also needs to follow-up in the foot and ankle clinic although apparently has had multiple missed visits. He is high risk for poor outcome.    Discharge Summary    Patient ID: [name] MRN: [id number]; DOB: June 26, 1951  Admit date: 12/31/2019 Discharge date: 01/03/2020  Primary Care Provider: [name], MD  Primary Cardiologist: [name] Field, MD    Discharge Diagnoses    Principal Problem:   Acute diastolic HF (heart failure) [facility]) Active Problems:   Type 2 diabetes, uncontrolled, with retinopathy ([place])   Essential hypertension   Fluid overload   Peripheral edema   Shortness of breath   CKD stage 3 due to type 2 diabetes mellitus ([place])   Acute CHF (congestive heart failure) ([place])   LLE cellulitis   Left planter foot wound   Diagnostic Studies/Procedures   Echo 01/01/20 MPRESSIONS  1. Left ventricular ejection fraction, by estimation, is 60 to 65%. The left ventricle has normal function. The left ventricle has no regional wall motion abnormalities. There is mild left ventricular hypertrophy. Left ventricular diastolic  parameters are consistent with Grade II diastolic dysfunction (pseudonormalization). Elevated left ventricular end-diastolic pressure.  2. Right ventricular systolic function is normal. The right ventricular size is normal.  3. The mitral valve is normal in structure. Mild mitral valve regurgitation. No evidence of mitral stenosis.  4. The aortic valve is tricuspid. Aortic valve regurgitation is mild. No aortic stenosis is present.  5. The inferior vena cava is normal in size with greater than 50% respiratory variability, suggesting right atrial pressure of 3 mmHg.   History of Present Illness     69 y.o. male with a history of chronic diastolic CHF, PAD s/p left anterior tibial angioplasty in 05/2019, 1st degree AV block, hypertension, hyperlipidemia, uncontrolled diabetes mellitus, CKD stage III, and pulmonary sarcoidosis who was admitted from the office on 12/31/2019 for acute on chronic diastolic CHF that did not respond to increase in outpatient therapy.   Hospital Course     Consultants: None  1.  Acute on chronic diastolic heart failure -BNP 168.  Chest x-ray without overt edema.  Echocardiogram showed LV function of 60 to 65% and grade 2 diastolic dysfunction. Initially had IV access now on IV Lasix 120 mg twice daily>> I & O not been accurately recorded. Lost 6lb (166>>>160).  Added hydralazine for afterload reduction and elevated blood pressure.  - No ACE/ARB due to CKD - No BB due to baseline bradycardia - Start Torsemide 2m daily.    2. LLE cellulitis with wound - No DVT on doppler - His LLE was warm to touch and had Left planter foot blister evolved into wound>> followed  by [place] - Given concern for cellulitis >> started on Ancef after discussion with pharmacy. His diabetics is not well controlled >> concern for diabetic foot ulcer with prior hx of osteomyelitis>> Lower extremity edema warmth improved after starting ancef. He will be discharged on doxycycline 177m BID + cefdinir 3041m q24h x14 days. - He will follow up in wound and ankle/foot clinic to get an appropriate shoe - Felt his lower extremity edema related to deep infection rather than heart failure - he was follow up with Dr. ClCarlis Abbottext week.  - concern about left leg decreased blood flow and potential critical limb ischemia  3.  Acute on chronic kidney disease stage IV -Baseline Creatinine consistently elevated in 2.2-2.7 range.  - Will need nephrology evaluation as outpatient.    4.  Uncontrolled diabetes mellitus -Hemoglobin A1c 13 -Cannot afford home insulin. He used all his saving for his mom's funeral. He has insurance.  -Seen by nutritionist and diabetic coordinator  -started on Relion Novolin insulin pen 10 unit BID   5.  Hypertension - BP normalized  -Continue amlodipine 10 mg daily and  hydralazine 254mID as above    Did the patient have an acute coronary syndrome (MI, NSTEMI, STEMI, etc) this admission?:  No                               Did the patient have a percutaneous coronary intervention (stent / angioplasty)?:  No.   _____________  Discharge Vitals Blood pressure 139/63, pulse 73, temperature 98.4 F (36.9 C), temperature source Oral, resp. rate 17, height '5\' 6"'  (1.676 m), weight 72.9 kg, SpO2 100 %.  Filed Weights   01/01/20 0459 01/02/20 0430 01/03/20 0349  Weight: 73.7 kg 73.4 kg 72.9 kg    Labs & Radiologic Studies    CBC Recent Labs    12/31/19 2108  WBC 3.8*  NEUTROABS 2.0  HGB 11.1*  HCT 36.2*  MCV 67.7*  PLT [id number] Metabolic Panel Recent Labs    01/02/20 0534 01/03/20 0405  NA 138 136  K 4.6 4.3  CL 96* 95*  CO2 31 32  GLUCOSE 110* 180*  BUN 27* 32*  CREATININE 2.65* 2.63*  CALCIUM 9.3 9.2   Liver Function Tests Recent Labs    12/31/19 2108  AST 25  ALT 21  ALKPHOS 110  BILITOT 1.2  PROT 7.2  ALBUMIN 3.3*   Hemoglobin A1C Recent Labs    12/31/19 2108  HGBA1C 12.8*   _____________  DG Chest 2 View  Result Date:  12/31/2019 CLINICAL DATA:  Lower extremity swelling EXAM: CHEST - 2 VIEW COMPARISON:  06/25/2019 FINDINGS: The heart size and mediastinal contours are within normal limits. Both lungs are clear. The visualized skeletal structures are unremarkable. IMPRESSION: No active cardiopulmonary disease. Electronically Signed   By: KimDonavan FoilD.   On: 12/31/2019 20:37   ECHOCARDIOGRAM COMPLETE  Result Date: 01/01/2020    ECHOCARDIOGRAM REPORT   Patient Name:   [name] DETWILERte of Exam: 01/01/2020 Medical Rec #:  [id number]     Height:       66.0 in Accession #:    [id number]    Weight:       162.5 lb Date of Birth:  10/April 24, 1951     BSA:          1.831 m Patient Age:  68 years         BP:           159/60 mmHg Patient Gender: M                HR:           58 bpm. Exam Location:  Inpatient Procedure: 2D Echo, Cardiac Doppler and Color Doppler Indications:    CHF  History:        Patient has prior history of Echocardiogram examinations, most                 recent 05/06/2019. CHF, PAD, Arrythmias:1st degree AV block; Risk                 Factors:Hypertension, Dyslipidemia and Diabetes. CKD,.  Sonographer:    Dustin Flock Referring Phys: Portal  1. Left ventricular ejection fraction, by estimation, is 60 to 65%. The left ventricle has normal function. The left ventricle has no regional wall motion abnormalities. There is mild left ventricular hypertrophy. Left ventricular diastolic parameters are consistent with Grade II diastolic dysfunction (pseudonormalization). Elevated left ventricular end-diastolic pressure.  2. Right ventricular systolic function is normal. The right ventricular size is normal.  3. The mitral valve is normal in structure. Mild mitral valve regurgitation. No evidence of mitral stenosis.  4. The aortic valve is tricuspid. Aortic valve regurgitation is mild. No aortic stenosis is present.  5. The inferior vena cava is normal in size with greater than 50%  respiratory variability, suggesting right atrial pressure of 3 mmHg. FINDINGS  Left Ventricle: Left ventricular ejection fraction, by estimation, is 60 to 65%. The left ventricle has normal function. The left ventricle has no regional wall motion abnormalities. The left ventricular internal cavity size was normal in size. There is  mild left ventricular hypertrophy. Left ventricular diastolic parameters are consistent with Grade II diastolic dysfunction (pseudonormalization). Elevated left ventricular end-diastolic pressure. Right Ventricle: The right ventricular size is normal. No increase in right ventricular wall thickness. Right ventricular systolic function is normal. Left Atrium: Left atrial size was normal in size. Right Atrium: Right atrial size was normal in size. Pericardium: There is no evidence of pericardial effusion. Mitral Valve: The mitral valve is normal in structure. There is mild thickening of the mitral valve leaflet(s). Normal mobility of the mitral valve leaflets. Mild mitral valve regurgitation. No evidence of mitral valve stenosis. Tricuspid Valve: The tricuspid valve is normal in structure. Tricuspid valve regurgitation is trivial. No evidence of tricuspid stenosis. Aortic Valve: The aortic valve is tricuspid. Aortic valve regurgitation is mild. No aortic stenosis is present. Pulmonic Valve: The pulmonic valve was normal in structure. Pulmonic valve regurgitation is mild. No evidence of pulmonic stenosis. Aorta: The aortic root is normal in size and structure. Venous: The inferior vena cava is normal in size with greater than 50% respiratory variability, suggesting right atrial pressure of 3 mmHg. IAS/Shunts: No atrial level shunt detected by color flow Doppler.  LEFT VENTRICLE PLAX 2D LVIDd:         4.10 cm  Diastology LVIDs:         3.00 cm  LV e' lateral:   6.20 cm/s LV PW:         1.20 cm  LV E/e' lateral: 14.9 LV IVS:        1.20 cm  LV e' medial:    5.44 cm/s LVOT diam:     2.10 cm  LV  E/e' medial:  16.9 LV SV:         63 LV SV Index:   35 LVOT Area:     3.46 cm  RIGHT VENTRICLE RV Basal diam:  2.90 cm RV S prime:     6.31 cm/s TAPSE (M-mode): 2.0 cm LEFT ATRIUM             Index       RIGHT ATRIUM           Index LA diam:        3.70 cm 2.02 cm/m  RA Area:     10.30 cm LA Vol (A2C):   29.2 ml 15.95 ml/m RA Volume:   21.30 ml  11.63 ml/m LA Vol (A4C):   36.4 ml 19.88 ml/m LA Biplane Vol: 33.7 ml 18.41 ml/m  AORTIC VALVE LVOT Vmax:   84.90 cm/s LVOT Vmean:  57.300 cm/s LVOT VTI:    0.183 m  AORTA Ao Root diam: 3.00 cm MITRAL VALVE MV Area (PHT): 4.15 cm    SHUNTS MV Decel Time: 183 msec    Systemic VTI:  0.18 m MV E velocity: 92.10 cm/s  Systemic Diam: 2.10 cm MV A velocity: 68.60 cm/s MV E/A ratio:  1.34 Jenkins Rouge MD Electronically signed by Jenkins Rouge MD Signature Date/Time: 01/01/2020/3:49:03 PM    Final    VAS [place] LOWER EXTREMITY VENOUS (DVT)  Result Date: 01/01/2020  Lower Venous DVTStudy Indications: Swelling, and Erythema.  Limitations: Edema. Comparison Study: 05/16/19 negative Performing Technologist: June Leap RDMS, RVT  Examination Guidelines: A complete evaluation includes B-mode imaging, spectral Doppler, color Doppler, and power Doppler as needed of all accessible portions of each vessel. Bilateral testing is considered an integral part of a complete examination. Limited examinations for reoccurring indications may be performed as noted. The reflux portion of the exam is performed with the patient in reverse Trendelenburg.  +-----+---------------+---------+-----------+----------+--------------+ RIGHTCompressibilityPhasicitySpontaneityPropertiesThrombus Aging +-----+---------------+---------+-----------+----------+--------------+ CFV  Full           Yes      Yes                                 +-----+---------------+---------+-----------+----------+--------------+   +---------+---------------+---------+-----------+----------+-------------------+ LEFT      CompressibilityPhasicitySpontaneityPropertiesThrombus Aging      +---------+---------------+---------+-----------+----------+-------------------+ CFV      Full           Yes      Yes                                      +---------+---------------+---------+-----------+----------+-------------------+ SFJ      Full                                                             +---------+---------------+---------+-----------+----------+-------------------+ FV Prox  Full                                                             +---------+---------------+---------+-----------+----------+-------------------+ FV Mid   Full                                                             +---------+---------------+---------+-----------+----------+-------------------+  FV DistalFull                                                             +---------+---------------+---------+-----------+----------+-------------------+ PFV      Full                                                             +---------+---------------+---------+-----------+----------+-------------------+ POP      Full           Yes      Yes                                      +---------+---------------+---------+-----------+----------+-------------------+ PTV      Full                    Yes                  not well visualized +---------+---------------+---------+-----------+----------+-------------------+ PERO     Full                                         not well visualized +---------+---------------+---------+-----------+----------+-------------------+     Summary: RIGHT: - No evidence of common femoral vein obstruction.  LEFT: - There is no evidence of deep vein thrombosis in the lower extremity.  - No cystic structure found in the popliteal fossa.  *See table(s) above for measurements and observations. Electronically signed by Harold Barban MD on 01/01/2020 at 4:52:44 PM.    Final     Disposition   Pt is being discharged home today in good condition.  Follow-up Plans & Appointments    Follow-up Information     [place]. Go on 01/16/2020.   Why: '@9' :50am Contact information: [place] [address] [phone]        Landis Martins, DPM Follow up on 01/14/2020.   Specialty: Podiatry Why: 1:30 for follow up apt Contact information: [place] [address] [phone]         [facility]              Follow up on 01/09/2020.   Why: 9:30 Contact information: [address] [phone]        Erlene Quan, PA-C. Go on 01/08/2020.   Specialties: Cardiology, Radiology Why: '@2' :15pm for cardiology hospital follow up  Contact information: [place] [address] [phone]         Marty Heck, MD. Go on 01/07/2020.   Specialty: Vascular Surgery Why: '@1pm'  for LE arterial study and appointment with Dr. Carlis Abbott '@2' :20 Contact information: [address] [place] [address] [phone]           Discharge Instructions     Diet - low sodium heart healthy   Complete by: As directed  Increase activity slowly   Complete by: As directed        Discharge Medications   Allergies as of 01/03/2020       Reactions   Ace Inhibitors Itching, Cough   Naproxen Anaphylaxis, Shortness Of Breath, Other (See Comments)   Throat swells. cannot breathe, and causes GI distress        Medication List     STOP taking these medications    docusate sodium 100 MG capsule Commonly known as: COLACE   Insulin Syringe-Needle U-100 31G X 5/16" 0.5 ML Misc Commonly known as: BD Insulin Syringe Ultrafine       TAKE these medications    amLODipine 10 MG tablet Commonly known as: NORVASC Take 1 tablet (10 mg total) by mouth daily.    aspirin 81 MG chewable tablet Chew 1 tablet (81 mg total) by mouth every morning.   atorvastatin 40 MG tablet Commonly known as: LIPITOR Take 1 tablet (40 mg total) by mouth every morning.   blood glucose meter kit and supplies Dispense based on patient and insurance preference. Use up to four times daily as directed. (FOR ICD-10 E10.9, E11.9).   blood glucose meter kit and supplies Kit Dispense based on patient and insurance preference. Use up to four times daily as directed.   cefdinir 300 MG capsule Commonly known as: OMNICEF Take 1 capsule (300 mg total) by mouth daily for 14 days.   Centrum Silver 50+Men Tabs Take 1 tablet by mouth daily with breakfast.   clopidogrel 75 MG tablet Commonly known as: PLAVIX Take 1 tablet (75 mg total) by mouth daily with breakfast.   doxycycline 50 MG capsule Commonly known as: VIBRAMYCIN Take 2 capsules (100 mg total) by mouth 2 (two) times daily for 14 days.   freestyle lancets Use as instructed   Lancets 30G Misc   gabapentin 300 MG capsule Commonly known as: NEURONTIN TAKE 1 CAPSULE(300 MG) BY MOUTH THREE TIMES DAILY What changed: See the new instructions.   hydrALAZINE 25 MG tablet Commonly known as: APRESOLINE Take 1 tablet (25 mg total) by mouth every 8 (eight) hours.   Insulin Pen Needle 32G X 4 MM Misc 1 each by Does not apply route 1 day or 1 dose for 1 dose. Further refills by PCP   Medihoney Wound/Burn Dressing Gel Apply to affected are 3 times a week, and cover with sterile dressing.   NovoLIN 70/30 FlexPen Relion (70-30) 100 UNIT/ML KwikPen Generic drug: insulin isophane & regular human Inject 10 Units into the skin 2 (two) times daily. Further refills by PCP What changed:  how much to take how to take this when to take this additional instructions   ReliOn Confirm Glucose Monitor w/Device Kit Use to check blood sugar 3 times a day.   SYSTANE OP Place 1 drop into both eyes as needed (for dryness).    torsemide 20 MG tablet Commonly known as: DEMADEX Take 1 tablet (20 mg total) by mouth daily. What changed:  medication strength how much to take           Outstanding Labs/Studies   BMET in one week  Duration of Discharge Encounter   Greater than 30 minutes including physician time.  Jarrett Soho, PA 01/03/2020, 1:59 PM

## 2020-01-03 NOTE — Progress Notes (Addendum)
The patient has been seen in conjunction with [facility]. All aspects of care have been considered and discussed. The patient has been personally interviewed, examined, and all clinical data has been reviewed.   This is a difficult clinical scenario.  He still has dyspnea on exertion, and difficulty with ambulation due to an open wound on the base of his heel.  Left lower extremity swelling is markedly improved and warmth is less.  He is now on Keflex and doxycycline.  He is not volume overloaded.  BUN and creatinine are consistently elevated at the 2.2-2.7 range.  We will plan Demadex 20 mg daily.  He will have follow-up TOC visit in 1 week.  My overall feeling is lower extremity edema is related in part to deep infection rather than heart failure.  Needs an appointment set for the wound clinic and for the ankle and foot clinic to get an appropriate shoe made that will allow ambulation.  He is also set to follow-up in the vascular vein clinic (Dr. Carlis Abbott).  I am concerned about decreased left leg blood flow and potential critical limb ischemia.   Progress Note  Patient Name: [name]  Date of Encounter: 01/03/2020  Primary Cardiologist: Evalina Field, MD   Subjective   Left leg edema improved but pain occurred. Shortness of breath with walking here.   Inpatient Medications    Scheduled Meds: . amLODipine  10 mg Oral Daily  . aspirin  81 mg Oral q morning - 10a  . atorvastatin  40 mg Oral q morning - 10a  . clopidogrel  75 mg Oral Q breakfast  . gabapentin  300 mg Oral BID  . hydrALAZINE  25 mg Oral Q8H  . insulin aspart  0-15 Units Subcutaneous TID WC  . insulin aspart  0-5 Units Subcutaneous QHS  . insulin aspart protamine- aspart  10 Units Subcutaneous BID AC  . multivitamin with minerals  1 tablet Oral Q breakfast  . nutrition supplement (JUVEN)  1 packet Oral BID BM  . sodium chloride flush  3 mL Intravenous Q12H   Continuous Infusions: . sodium chloride     .  ceFAZolin (ANCEF) IV 1 g (01/02/20 2300)   PRN Meds: sodium chloride, acetaminophen, ALPRAZolam, docusate sodium, nitroGLYCERIN, ondansetron (ZOFRAN) IV, polyvinyl alcohol, sodium chloride flush, zolpidem   Vital Signs    Vitals:   01/03/20 0011 01/03/20 0340 01/03/20 0349 01/03/20 0741  BP: (!) 148/63 139/65  139/63  Pulse: 74 67  73  Resp: 18 16  17   Temp: 97.6 F (36.4 C) 98 F (36.7 C)  98.4 F (36.9 C)  TempSrc: Oral Oral  Oral  SpO2: 98% 97%  100%  Weight:   72.9 kg   Height:        Intake/Output Summary (Last 24 hours) at 01/03/2020 1020 Last data filed at 01/03/2020 0700 Gross per 24 hour  Intake 620 ml  Output 0 ml  Net 620 ml   Last 3 Weights 01/03/2020 01/02/2020 01/01/2020  Weight (lbs) 160 lb 11.2 oz 161 lb 14.4 oz 162 lb 8 oz  Weight (kg) 72.893 kg 73.437 kg 73.71 kg      Telemetry    NSR - Personally Reviewed  ECG    N/A  Physical Exam   GEN: No acute distress.   Neck: No JVD Cardiac: RRR, no murmurs, rubs, or gallops.  Respiratory: Clear to auscultation bilaterally. GI: Soft, nontender, non-distended  MS: Trace edema. LLE warm and has wound  Neuro:  Nonfocal  Psych: Normal affect   Labs     Chemistry Recent Labs  Lab 12/31/19 2108 01/01/20 0516 01/01/20 1644 01/02/20 0534 01/03/20 0405  NA 138   < > 135 138 136  K 5.3*   < > 4.3 4.6 4.3  CL 95*   < > 94* 96* 95*  CO2 30   < > 29 31 32  GLUCOSE 271*   < > 214* 110* 180*  BUN 26*   < > 27* 27* 32*  CREATININE 2.65*   < > 2.50* 2.65* 2.63*  CALCIUM 9.6   < > 9.1 9.3 9.2  PROT 7.2  --   --   --   --   ALBUMIN 3.3*  --   --   --   --   AST 25  --   --   --   --   ALT 21  --   --   --   --   ALKPHOS 110  --   --   --   --   BILITOT 1.2  --   --   --   --   GFRNONAA 23*   < > 25* 23* 24*  GFRAA 27*   < > 29* 27* 27*  ANIONGAP 13   < > 12 11 9    < > = values in this interval not displayed.     Hematology Recent Labs  Lab 12/31/19 2108  WBC 3.8*  RBC 5.35  HGB 11.1*    HCT 36.2*  MCV 67.7*  MCH 20.7*  MCHC 30.7  RDW 15.1  PLT 209    BNP Recent Labs  Lab 12/31/19 2108  BNP 168.3*     DDimer No results for input(s): DDIMER in the last 168 hours.   Radiology    ECHOCARDIOGRAM COMPLETE  Result Date: 01/01/2020    ECHOCARDIOGRAM REPORT   Patient Name:   [name] Date of Exam: 01/01/2020 Medical Rec #:  [id number]        Height:       66.0 in Accession #:    [id number]       Weight:       162.5 lb Date of Birth:  07/31/1951        BSA:          1.831 m Patient Age:    69 years         BP:           159/60 mmHg Patient Gender: M                HR:           58 bpm. Exam Location:  Inpatient Procedure: 2D Echo, Cardiac Doppler and Color Doppler Indications:    CHF  History:        Patient has prior history of Echocardiogram examinations, most                 recent 05/06/2019. CHF, PAD, Arrythmias:1st degree AV block; Risk                 Factors:Hypertension, Dyslipidemia and Diabetes. CKD,.  Sonographer:    Dustin Flock Referring Phys: [place]  1. Left ventricular ejection fraction, by estimation, is 60 to 65%. The left ventricle has normal function. The left ventricle has no regional wall motion abnormalities. There is mild left ventricular hypertrophy. Left ventricular diastolic parameters are consistent with Grade II diastolic  dysfunction (pseudonormalization). Elevated left ventricular end-diastolic pressure.  2. Right ventricular systolic function is normal. The right ventricular size is normal.  3. The mitral valve is normal in structure. Mild mitral valve regurgitation. No evidence of mitral stenosis.  4. The aortic valve is tricuspid. Aortic valve regurgitation is mild. No aortic stenosis is present.  5. The inferior vena cava is normal in size with greater than 50% respiratory variability, suggesting right atrial pressure of 3 mmHg. FINDINGS  Left Ventricle: Left ventricular ejection fraction, by estimation, is 60 to  65%. The left ventricle has normal function. The left ventricle has no regional wall motion abnormalities. The left ventricular internal cavity size was normal in size. There is  mild left ventricular hypertrophy. Left ventricular diastolic parameters are consistent with Grade II diastolic dysfunction (pseudonormalization). Elevated left ventricular end-diastolic pressure. Right Ventricle: The right ventricular size is normal. No increase in right ventricular wall thickness. Right ventricular systolic function is normal. Left Atrium: Left atrial size was normal in size. Right Atrium: Right atrial size was normal in size. Pericardium: There is no evidence of pericardial effusion. Mitral Valve: The mitral valve is normal in structure. There is mild thickening of the mitral valve leaflet(s). Normal mobility of the mitral valve leaflets. Mild mitral valve regurgitation. No evidence of mitral valve stenosis. Tricuspid Valve: The tricuspid valve is normal in structure. Tricuspid valve regurgitation is trivial. No evidence of tricuspid stenosis. Aortic Valve: The aortic valve is tricuspid. Aortic valve regurgitation is mild. No aortic stenosis is present. Pulmonic Valve: The pulmonic valve was normal in structure. Pulmonic valve regurgitation is mild. No evidence of pulmonic stenosis. Aorta: The aortic root is normal in size and structure. Venous: The inferior vena cava is normal in size with greater than 50% respiratory variability, suggesting right atrial pressure of 3 mmHg. IAS/Shunts: No atrial level shunt detected by color flow Doppler.  LEFT VENTRICLE PLAX 2D LVIDd:         4.10 cm  Diastology LVIDs:         3.00 cm  LV e' lateral:   6.20 cm/s LV PW:         1.20 cm  LV E/e' lateral: 14.9 LV IVS:        1.20 cm  LV e' medial:    5.44 cm/s LVOT diam:     2.10 cm  LV E/e' medial:  16.9 LV SV:         63 LV SV Index:   35 LVOT Area:     3.46 cm  RIGHT VENTRICLE RV Basal diam:  2.90 cm RV S prime:     6.31 cm/s TAPSE  (M-mode): 2.0 cm LEFT ATRIUM             Index       RIGHT ATRIUM           Index LA diam:        3.70 cm 2.02 cm/m  RA Area:     10.30 cm LA Vol (A2C):   29.2 ml 15.95 ml/m RA Volume:   21.30 ml  11.63 ml/m LA Vol (A4C):   36.4 ml 19.88 ml/m LA Biplane Vol: 33.7 ml 18.41 ml/m  AORTIC VALVE LVOT Vmax:   84.90 cm/s LVOT Vmean:  57.300 cm/s LVOT VTI:    0.183 m  AORTA Ao Root diam: 3.00 cm MITRAL VALVE MV Area (PHT): 4.15 cm    SHUNTS MV Decel Time: 183 msec    Systemic VTI:  0.18 m  MV E velocity: 92.10 cm/s  Systemic Diam: 2.10 cm MV A velocity: 68.60 cm/s MV E/A ratio:  1.34 Jenkins Rouge MD Electronically signed by Jenkins Rouge MD Signature Date/Time: 01/01/2020/3:49:03 PM    Final    VAS [place] LOWER EXTREMITY VENOUS (DVT)  Result Date: 01/01/2020  Lower Venous DVTStudy Indications: Swelling, and Erythema.  Limitations: Edema. Comparison Study: 05/16/19 negative Performing Technologist: June Leap RDMS, RVT  Examination Guidelines: A complete evaluation includes B-mode imaging, spectral Doppler, color Doppler, and power Doppler as needed of all accessible portions of each vessel. Bilateral testing is considered an integral part of a complete examination. Limited examinations for reoccurring indications may be performed as noted. The reflux portion of the exam is performed with the patient in reverse Trendelenburg.  +-----+---------------+---------+-----------+----------+--------------+ RIGHTCompressibilityPhasicitySpontaneityPropertiesThrombus Aging +-----+---------------+---------+-----------+----------+--------------+ CFV  Full           Yes      Yes                                 +-----+---------------+---------+-----------+----------+--------------+   +---------+---------------+---------+-----------+----------+-------------------+ LEFT     CompressibilityPhasicitySpontaneityPropertiesThrombus Aging      +---------+---------------+---------+-----------+----------+-------------------+  CFV      Full           Yes      Yes                                      +---------+---------------+---------+-----------+----------+-------------------+ SFJ      Full                                                             +---------+---------------+---------+-----------+----------+-------------------+ FV Prox  Full                                                             +---------+---------------+---------+-----------+----------+-------------------+ FV Mid   Full                                                             +---------+---------------+---------+-----------+----------+-------------------+ FV DistalFull                                                             +---------+---------------+---------+-----------+----------+-------------------+ PFV      Full                                                             +---------+---------------+---------+-----------+----------+-------------------+ POP  Full           Yes      Yes                                      +---------+---------------+---------+-----------+----------+-------------------+ PTV      Full                    Yes                  not well visualized +---------+---------------+---------+-----------+----------+-------------------+ PERO     Full                                         not well visualized +---------+---------------+---------+-----------+----------+-------------------+     Summary: RIGHT: - No evidence of common femoral vein obstruction.  LEFT: - There is no evidence of deep vein thrombosis in the lower extremity.  - No cystic structure found in the popliteal fossa.  *See table(s) above for measurements and observations. Electronically signed by Harold Barban MD on 01/01/2020 at 4:52:44 PM.    Final     Cardiac Studies   Echo 01/01/20 MPRESSIONS  1. Left ventricular ejection fraction, by estimation, is 60 to 65%. The left ventricle has normal  function. The left ventricle has no regional wall motion abnormalities. There is mild left ventricular hypertrophy. Left ventricular diastolic parameters are consistent with Grade II diastolic dysfunction (pseudonormalization). Elevated left ventricular end-diastolic pressure.  2. Right ventricular systolic function is normal. The right ventricular size is normal.  3. The mitral valve is normal in structure. Mild mitral valve regurgitation. No evidence of mitral stenosis.  4. The aortic valve is tricuspid. Aortic valve regurgitation is mild. No aortic stenosis is present.  5. The inferior vena cava is normal in size with greater than 50% respiratory variability, suggesting right atrial pressure of 3 mmHg.  Patient Profile     69 y.o. male with a history of chronic diastolic CHF, PAD s/p left anterior tibial angioplasty in 05/2019, 1st degree AV block, hypertension, hyperlipidemia, uncontrolled diabetes mellitus, CKD stage III, and pulmonary sarcoidosis who was admitted from the office on 12/31/2019 for acute on chronic diastolic CHF that did not respond to increase in outpatient therapy.  Assessment & Plan    1.  Acute on chronic diastolic heart failure -BNP 168.  Chest x-ray without overt edema.  Echocardiogram showed LV function of 60 to 65% and grade 2 diastolic dysfunction. -Initially had IV access now on IV Lasix 120 mg twice daily>> I & O not been accurately recorded. Lost 6lb (166>>>160).  - Stop IV lasix >> start home torsemide vs lasix (will review with MD) - Added hydralazine for afterload reduction - No ACE/ARB due to CKD - No BB due to baseline bradycardia  2. LLE cellulitis with would - No DVT on doppler - His LLE was warm to touch and had Left planter foot blister evolved into wound>> followed by [facility] - Given concern for cellulitis >> started on Ancef after discussion with pharmacy. His diabetics is not well controlled >> concern for diabetic foot ulcer with prior hx of  osteomyelitis>> will add Vancomycin and plan to discharge on doxycycline (see pharmacy note).  3.  Acute on chronic kidney disease stage IV -Baseline Creatinine 1.7 to 2 - Scr stable today  4.  Uncontrolled diabetes mellitus -Hemoglobin A1c 13 -Cannot afford home insulin. He used all his saving for his mom's funeral  >> CM help -Seen by nutritionist and diabetic coordinator  -Appreciate any assistant   4.  Hypertension - BP normalized  -Continue amlodipine 10 mg daily and  hydralazine 25mg  TID as above    For questions or updates, please contact [place] Please consult [email or web address] for contact info under        SignedLeanor Kail, PA  01/03/2020, 10:21 AM

## 2020-01-03 NOTE — Progress Notes (Signed)
Inpatient Diabetes Program Recommendations  AACE/ADA: New Consensus Statement on Inpatient Glycemic Control (2015)  Target Ranges:  Prepandial:   less than 140 mg/dL      Peak postprandial:   less than 180 mg/dL (1-2 hours)      Critically ill patients:  140 - 180 mg/dL   Lab Results  Component Value Date   GLUCAP 145 (H) 01/03/2020   HGBA1C 12.8 (H) 12/31/2019    Review of Glycemic Control  Diabetes history: DM 2 Outpatient Diabetes medications: 70/30 16-20 units bid   Spoke with pt again. Spoke about the [employer] ReliOn insulin. Gave pt ReliOn product list. Pt reiterated he needs a glucometer at time of d/c.  Spoke with pt about glucose control and wound healing. Pt prefers 70/30 relion insulin pen. Pt to check glucose at home and follow up with PCP.  Novolin ReliOn 70/30 flexpen order # [id number] Insulin pen needles order # [id number] Blood glucose meter kit order # [id number]  Thanks,  Tama Headings RN, MSN, BC-ADM Inpatient Diabetes Coordinator Team Pager [phone] (8a-5p)

## 2020-01-03 NOTE — Progress Notes (Signed)
Pharmacy Antibiotic Note  [name] is a 69 y.o. male admitted on 12/31/2019 with volume overload. Pt has hx uncontrolled DM and hx osteomyelitis (10/2018), now with concerns for infected foot ulcer although it improved with 2 doses of cefazolin. Pharmacy has been consulted for antimicrobial recommendations. Pt previously on vancomycin and ceftriaxone so will broaden to this regimen given uncontrolled DM and ulceration.  Plan: -Vancomycin 750mg  IV q24h -Ceftriaxone 2g IV q24h -If discharged tomorrow - recommend doxycycline 100mg  BID + cefdinir 300mg  q24h x14 days    Height: 5\' 6"  (167.6 cm) Weight: 72.9 kg (160 lb 11.2 oz) IBW/kg (Calculated) : 63.8  Temp (24hrs), Avg:98.1 F (36.7 C), Min:97.6 F (36.4 C), Max:98.4 F (36.9 C)  Recent Labs  Lab 12/31/19 2108 01/01/20 0516 01/01/20 1644 01/02/20 0534 01/03/20 0405  WBC 3.8*  --   --   --   --   CREATININE 2.65* 2.54* 2.50* 2.65* 2.63*    Estimated Creatinine Clearance: 23.6 mL/min (A) (by C-G formula based on SCr of 2.63 mg/dL (H)).    Allergies  Allergen Reactions  . Ace Inhibitors Itching and Cough  . Naproxen Anaphylaxis, Shortness Of Breath and Other (See Comments)    Throat swells. cannot breathe, and causes GI distress    Antimicrobials this admission: Cefazolin 5/20 >> 5/21 Vancomycin 5/21 >> Ceftriaxone 5/21 >>  Dose adjustments this admission: none   Thank you for allowing pharmacy to be a part of this patient's care.   Arrie Senate, PharmD, BCPS Clinical Pharmacist [phone] Please check AMION for all [place] numbers 01/03/2020

## 2020-01-03 NOTE — Care Management Important Message (Signed)
Important Message  Patient Details  Name: BOLDEN HAGERMAN MRN: [id number] Date of Birth: [birth year]   Medicare Important Message Given:  Yes     Shelda Altes 01/03/2020, 10:44 AM

## 2020-01-03 NOTE — TOC Transition Note (Signed)
Transition of Care [facility]) - CM/SW Discharge Note   Patient Details  Name: [name] MRN: [id number] Date of Birth: 09-12-1950  [facility] CM/SW Contact:  Zenon Mayo, RN Phone Number: 01/03/2020, 12:51 PM   Clinical Narrative:    NCM spoke with patient, he has insurance and he states he can not afford the 47.00 for the insulin, NCM informed MD and Diabetes coordinator.  Patient states he may can afford the reli- on inuslin.  MD to give script to patient to take to [facility] , this insulin is over the counter.  Also TOC is filling his abx medications.  NCM made a follow up apt for patient at the [facility] and also Secretary made follow up for the [facility] foot center.  He is for dc today per MD.    Final next level of care: Home/Self Care Barriers to Discharge: No Barriers Identified   Patient Goals and CMS Choice Patient states their goals for this hospitalization and ongoing recovery are:: get better   Choice offered to / list presented to : NA  Discharge Placement                       Discharge Plan and Services                  DME Agency: NA       HH Arranged: NA          Social Determinants of Health (SDOH) Interventions     Readmission Risk Interventions Readmission Risk Prevention Plan 06/27/2019 05/09/2019  Transportation Screening Complete Complete  PCP or Specialist Appt within 3-5 Days - Complete  HRI or [place] Complete Complete  Social Work Consult for [place] Planning/Counseling Complete Complete  Palliative Care Screening Not Applicable Not Applicable  Medication Review Press photographer) Complete Complete  Some recent data might be hidden

## 2020-01-03 NOTE — Plan of Care (Signed)

## 2020-01-06 ENCOUNTER — Emergency Department (HOSPITAL_COMMUNITY)
Admission: EM | Admit: 2020-01-06 | Discharge: 2020-01-07 | Disposition: A | Discharge status: Home / Self Care | Payer: Medicare Other | Attending: Emergency Medicine | Admitting: Emergency Medicine

## 2020-01-06 ENCOUNTER — Encounter (HOSPITAL_COMMUNITY): Payer: Self-pay | Admitting: Emergency Medicine

## 2020-01-06 ENCOUNTER — Other Ambulatory Visit: Payer: Self-pay

## 2020-01-06 DIAGNOSIS — X58XXXA Exposure to other specified factors, initial encounter: Secondary | ICD-10-CM | POA: Insufficient documentation

## 2020-01-06 DIAGNOSIS — I130 Hypertensive heart and chronic kidney disease with heart failure and stage 1 through stage 4 chronic kidney disease, or unspecified chronic kidney disease: Secondary | ICD-10-CM | POA: Diagnosis not present

## 2020-01-06 DIAGNOSIS — N183 Chronic kidney disease, stage 3 unspecified: Secondary | ICD-10-CM | POA: Insufficient documentation

## 2020-01-06 DIAGNOSIS — S0920XA Traumatic rupture of unspecified ear drum, initial encounter: Secondary | ICD-10-CM | POA: Diagnosis not present

## 2020-01-06 DIAGNOSIS — H9202 Otalgia, left ear: Secondary | ICD-10-CM | POA: Diagnosis present

## 2020-01-06 DIAGNOSIS — I5032 Chronic diastolic (congestive) heart failure: Secondary | ICD-10-CM | POA: Insufficient documentation

## 2020-01-06 DIAGNOSIS — Y9222 Religious institution as the place of occurrence of the external cause: Secondary | ICD-10-CM | POA: Diagnosis not present

## 2020-01-06 DIAGNOSIS — Y999 Unspecified external cause status: Secondary | ICD-10-CM | POA: Diagnosis not present

## 2020-01-06 DIAGNOSIS — Z794 Long term (current) use of insulin: Secondary | ICD-10-CM | POA: Diagnosis not present

## 2020-01-06 DIAGNOSIS — Z7982 Long term (current) use of aspirin: Secondary | ICD-10-CM | POA: Diagnosis not present

## 2020-01-06 DIAGNOSIS — E1122 Type 2 diabetes mellitus with diabetic chronic kidney disease: Secondary | ICD-10-CM | POA: Diagnosis not present

## 2020-01-06 DIAGNOSIS — Y939 Activity, unspecified: Secondary | ICD-10-CM | POA: Insufficient documentation

## 2020-01-06 DIAGNOSIS — Z87891 Personal history of nicotine dependence: Secondary | ICD-10-CM | POA: Diagnosis not present

## 2020-01-06 DIAGNOSIS — H7292 Unspecified perforation of tympanic membrane, left ear: Secondary | ICD-10-CM

## 2020-01-06 NOTE — ED Triage Notes (Signed)
Pt reports he was listening to live loud music last night, woke up today unable to hear anything other than ringing in his hear.

## 2020-01-07 ENCOUNTER — Ambulatory Visit: Payer: Medicare Other | Admitting: Family Medicine

## 2020-01-07 ENCOUNTER — Ambulatory Visit (HOSPITAL_COMMUNITY)
Admission: RE | Admit: 2020-01-07 | Discharge: 2020-01-07 | Disposition: A | Discharge status: Home / Self Care | Payer: Medicare Other | Source: Ambulatory Visit | Attending: Vascular Surgery | Admitting: Vascular Surgery

## 2020-01-07 ENCOUNTER — Ambulatory Visit (HOSPITAL_COMMUNITY): Payer: Medicare Other

## 2020-01-07 ENCOUNTER — Ambulatory Visit: Payer: Medicare Other | Admitting: Vascular Surgery

## 2020-01-07 DIAGNOSIS — I739 Peripheral vascular disease, unspecified: Secondary | ICD-10-CM

## 2020-01-07 MED ORDER — OFLOXACIN 0.3 % OT SOLN: 5.0000 [drp] | Freq: Every day | OTIC | 0 refills | Status: AC

## 2020-01-07 MED ORDER — OFLOXACIN 0.3 % OP SOLN
5.0000 [drp] | Freq: Every day | OPHTHALMIC | Status: DC
  Administered 2020-01-07: 5 [drp] via OTIC
  Filled 2020-01-07: qty 5

## 2020-01-07 NOTE — Telephone Encounter (Signed)
Patient contacted regarding discharge from [place] on 01/04/20.  Patient understands to follow up with Kerin Ransom PA on 5/26 at 2:15 pm at [facility] location. Patient understands discharge instructions. Patient understands medications and regiment. Patient understands to bring all medications to this visit.

## 2020-01-07 NOTE — ED Provider Notes (Signed)
[facility] EMERGENCY DEPARTMENT Provider Note   CSN: [id number] Arrival date & time: 01/06/20  2207     History Chief Complaint  Patient presents with  . Otalgia    [name] is a 69 y.o. male.  HPI     This is a 69 year old male with history of diabetes, CHF who presents with hearing loss.  Patient reports that he was in church on Sunday and was sitting directly beside the band.  He states that the music was being played very loud.  He states that he woke up on Monday morning and could not hear out of his left ear.  He states he does have some ringing.  No significant pain.  Has not noted any drainage.  Denies any other symptoms.  Past Medical History:  Diagnosis Date  . Cellulitis and abscess of face 10/11/2007   Qualifier: Diagnosis of  By: Amil Amen MD, Benjamine Mola    . Diabetic retinopathy ([name])   . Diabetic retinopathy associated with type 2 diabetes mellitus ([place]) 09/13/2014   He works for [employer]  . Fluid overload 09/03/2012   Post op  . GERD (gastroesophageal reflux disease)   . Visual impairment     Patient Active Problem List   Diagnosis Date Noted  . Acute CHF (congestive heart failure) ([place]) 12/31/2019  . Dyslipidemia 08/19/2019  . Uncontrolled type 2 diabetes mellitus with hyperglycemia ([place])   . Hyperkalemia   . Acute diastolic HF (heart failure) ([place])   . Heart block AV complete ([place])   . Acute renal failure with acute tubular necrosis superimposed on stage 4 chronic kidney disease ([place])   . Optic atrophy of both eyes 06/13/2019  . H/O small bowel obstruction 05/17/2019  . PVD (peripheral vascular disease) ([place]) 05/14/2019  . Ulcer of left foot due to type 2 diabetes mellitus ([place])   . Skin ulcer of left foot, limited to breakdown of skin ([place])   . Charcot foot due to diabetes mellitus ([place])   . Charcot's joint of foot, left   . SBO (small bowel obstruction) ([place]) 05/06/2019  . Complete heart block ([place])  05/06/2019  . Moderate protein-calorie malnutrition ([place])   . Diabetic foot ulcer ([place]) 10/19/2018  . Diabetic foot infection ([place])   . Pseudophakia, both eyes 10/04/2018  . CKD stage 3 due to type 2 diabetes mellitus ([place]) 09/27/2018  . Adrenal insufficiency ([place]) 04/09/2015  . Peripheral edema 09/17/2014  . Shortness of breath 09/17/2014  . Bronchospasm, acute 09/16/2014  . Partial small bowel obstruction ([place]) 09/16/2014  . Hyperglycemia   . Diabetic retinopathy associated with type 2 diabetes mellitus ([place]) 09/13/2014  . Abdominal pain 09/13/2014  . Cough 11/15/2012  . Pharyngitis 09/14/2012  . Fluid overload 09/03/2012  . Ileus following gastrointestinal surgery ([place]) 09/03/2012  . Hypokalemia 08/30/2012  . Small bowel obstruction s/p LOA [id number] 08/21/2012  . Abnormal EKG 08/21/2012  . Chest pain 08/21/2012  . Macular degeneration (senile) of retina 12/15/2011  . Lens replaced 12/15/2011  . Vitreous hemorrhage ([place]) 12/15/2011  . Essential hypertension 08/19/2008  . Regional enteritis of small intestine ([place]) 01/03/2008  . ERECTILE DYSFUNCTION, ORGANIC 01/03/2008  . Sarcoidosis 07/19/2007  . Type 2 diabetes, uncontrolled, with retinopathy ([place]) 07/19/2007  . HYPERLIPIDEMIA, MIXED 07/19/2007  . CERUMEN IMPACTION, BILATERAL 07/19/2007  . URINARY INCONTINENCE, STRESS, MALE 08/17/2006  . PROSTATE CANCER, HX OF 08/17/2006  . DIABETIC  RETINOPATHY 01/09/2002    Past Surgical History:  Procedure Laterality Date  .  ABDOMINAL AORTOGRAM W/LOWER EXTREMITY Left 05/30/2019   Procedure: ABDOMINAL AORTOGRAM W/LOWER EXTREMITY;  Surgeon: Marty Heck, MD;  Location: [facility];  Service: Cardiovascular;  Laterality: Left;  . CATARACT EXTRACTION W/ INTRAOCULAR LENS  IMPLANT, BILATERAL    . EYE SURGERY Bilateral    "laser OR for diabetic retinopathy"  . INGUINAL HERNIA REPAIR     [name] 07/12/2010), "don't remember which side"  . LAPAROTOMY  08/23/2012   Procedure:  EXPLORATORY LAPAROTOMY;  Surgeon: [name], DO;  Location: [facility];  Service: General;  Laterality: N/A;  exploratory laparotomy with lysis of adhesions  . LYSIS OF ADHESION  08/23/2012   Procedure: LYSIS OF ADHESION;  Surgeon: [name], DO;  Location: [facility];  Service: General;;  . PERIPHERAL VASCULAR BALLOON ANGIOPLASTY  05/30/2019   Procedure: PERIPHERAL VASCULAR BALLOON ANGIOPLASTY;  Surgeon: Marty Heck, MD;  Location: [facility];  Service: Cardiovascular;;  left anterior tibial  . ROBOT ASSISTED LAPAROSCOPIC RADICAL PROSTATECTOMY  2000's   "had to finish manually after machine broke"  . SHOULDER SURGERY  1970's   separation; from playing football"       Family History  Problem Relation Age of Onset  . Diabetes Mellitus II Mother   . Colon cancer Neg Hx     Social History   Tobacco Use  . Smoking status: Former Smoker    Packs/day: 2.50    Years: 3.00    Pack years: 7.50    Types: Cigarettes    Quit date: 11/09/1966    Years since quitting: 53.1  . Smokeless tobacco: Never Used  Substance Use Topics  . Alcohol use: Yes    Comment: 04/08/2015 "maybe a beer/ or 2 or a glass of wine monthly"  . Drug use: No    Home Medications Prior to Admission medications   Medication Sig Start Date End Date Taking? Authorizing Provider  amLODipine (NORVASC) 10 MG tablet Take 1 tablet (10 mg total) by mouth daily. 12/26/19   O'NealCassie Freer, MD  aspirin 81 MG chewable tablet Chew 1 tablet (81 mg total) by mouth every morning. 09/25/19   O'NealCassie Freer, MD  atorvastatin (LIPITOR) 40 MG tablet Take 1 tablet (40 mg total) by mouth every morning. 12/26/19   O'Neal, Cassie Freer, MD  blood glucose meter kit and supplies KIT Dispense based on patient and insurance preference. Use up to four times daily as directed. 09/10/19   Philemon Kingdom, MD  blood glucose meter kit and supplies Dispense based on patient and insurance preference. Use up to four times daily  as directed. (FOR ICD-10 E10.9, E11.9). 01/03/20   Bhagat, Crista Luria, PA  Blood Glucose Monitoring Suppl (RELION CONFIRM GLUCOSE MONITOR) w/Device KIT Use to check blood sugar 3 times a day. 09/10/19   Philemon Kingdom, MD  cefdinir (OMNICEF) 300 MG capsule Take 1 capsule (300 mg total) by mouth daily for 14 days. 01/03/20 01/17/20  Leanor Kail, PA  clopidogrel (PLAVIX) 75 MG tablet Take 1 tablet (75 mg total) by mouth daily with breakfast. 12/26/19   O'Neal, Cassie Freer, MD  doxycycline (VIBRAMYCIN) 50 MG capsule Take 2 capsules (100 mg total) by mouth 2 (two) times daily for 14 days. 01/03/20 01/17/20  Bhagat, Crista Luria, PA  gabapentin (NEURONTIN) 300 MG capsule TAKE 1 CAPSULE(300 MG) BY MOUTH THREE TIMES DAILY Patient taking differently: Take 300 mg by mouth 2 (two) times daily. Take 300 mg by mouth in the morning and 300 mg at bedtime- may take an  additional 300 mg once a day as needed/as directed 02/12/19   [name], MD  hydrALAZINE (APRESOLINE) 25 MG tablet Take 1 tablet (25 mg total) by mouth every 8 (eight) hours. 01/03/20   Bhagat, Bhavinkumar, PA  insulin isophane & regular human (NOVOLIN 70/30 FLEXPEN RELION) (70-30) 100 UNIT/ML KwikPen Inject 10 Units into the skin 2 (two) times daily. Further refills by PCP 01/03/20   Leanor Kail, PA  Lancets (FREESTYLE) lancets Use as instructed 09/18/14   Delfina Redwood, MD  Lancets 30G MISC  04/06/16   [provider]  Multiple Vitamins-Minerals (CENTRUM SILVER 50+MEN) TABS Take 1 tablet by mouth daily with breakfast.    [provider]  ofloxacin (FLOXIN) 0.3 % OTIC solution Place 5 drops into the left ear daily. 01/07/20   Trinton Prewitt, Barbette Hair, MD  Polyethyl Glycol-Propyl Glycol (SYSTANE OP) Place 1 drop into both eyes as needed (for dryness).     [provider]  torsemide (DEMADEX) 20 MG tablet Take 1 tablet (20 mg total) by mouth daily. 01/03/20   Leanor Kail, PA  Wound Dressings (MEDIHONEY  WOUND/BURN DRESSING) GEL Apply to affected are 3 times a week, and cover with sterile dressing. 08/21/19   Landis Martins, DPM    Allergies    Ace inhibitors and Naproxen  Review of Systems   Review of Systems  Constitutional: Negative for fever.  HENT: Positive for hearing loss and tinnitus. Negative for ear discharge and ear pain.   All other systems reviewed and are negative.   Physical Exam Updated Vital Signs BP (!) 190/72 (BP Location: Right Arm)   Pulse 68   Temp 98 F (36.7 C) (Oral)   Resp 16   Ht 1.676 m ('5\' 6"' )   Wt 75.8 kg   SpO2 100%   BMI 26.95 kg/m   Physical Exam Vitals and nursing note reviewed.  Constitutional:      Appearance: He is well-developed.  HENT:     Head: Normocephalic and atraumatic.     Ears:     Comments: Right TM clear, left TM erythematous with defect noted in the 1 to 2 o'clock position, cerumen noted    Mouth/Throat:     Mouth: Mucous membranes are moist.  Cardiovascular:     Rate and Rhythm: Normal rate and regular rhythm.     Heart sounds: No murmur.  Pulmonary:     Effort: Pulmonary effort is normal. No respiratory distress.  Musculoskeletal:     Cervical back: Neck supple.     Right lower leg: No edema.     Left lower leg: No edema.  Lymphadenopathy:     Cervical: No cervical adenopathy.  Skin:    General: Skin is warm and dry.  Neurological:     Mental Status: He is alert and oriented to person, place, and time.  Psychiatric:        Mood and Affect: Mood normal.     ED Results / Procedures / Treatments   Labs (all labs ordered are listed, but only abnormal results are displayed) Labs Reviewed - No data to display  EKG None  Radiology No results found.  Procedures Procedures (including critical care time)  Medications Ordered in ED Medications  ofloxacin (OCUFLOX) 0.3 % ophthalmic solution 5 drop (5 drops Left EAR Given 01/07/20 0458)    ED Course  I have reviewed the triage vital signs and the nursing  notes.  Pertinent labs & imaging results that were available during my care of  the patient were reviewed by me and considered in my medical decision making (see chart for details).    MDM Rules/Calculators/A&P                       Patient presents with hearing loss in the left ear.  Exam is suggestive of a tympanic membrane rupture.  May be related to sitting beside loud music and trauma.  He is otherwise nontoxic.  Patient given ofloxacin drops and told to avoid water.  He will be given ENT referral given his significant hearing loss.  Likely needs audiology testing.  After history, exam, and medical workup I feel the patient has been appropriately medically screened and is safe for discharge home. Pertinent diagnoses were discussed with the patient. Patient was given return precautions.   Final Clinical Impression(s) / ED Diagnoses Final diagnoses:  Ruptured tympanic membrane, left    Rx / DC Orders ED Discharge Orders         Ordered    ofloxacin (FLOXIN) 0.3 % OTIC solution  Daily     01/07/20 0502           Merryl Hacker, MD 01/07/20 [phone]

## 2020-01-07 NOTE — Discharge Instructions (Addendum)
You are found to have a rupture of your left eardrum.  Follow-up with ENT for further evaluation and audiology evaluation.  Take ofloxacin as directed until follow-up

## 2020-01-08 ENCOUNTER — Encounter: Payer: Self-pay | Admitting: Cardiology

## 2020-01-08 ENCOUNTER — Ambulatory Visit (INDEPENDENT_AMBULATORY_CARE_PROVIDER_SITE_OTHER): Payer: Medicare Other | Admitting: Cardiology

## 2020-01-08 ENCOUNTER — Other Ambulatory Visit: Payer: Self-pay

## 2020-01-08 VITALS — BP 150/66 | HR 70 | Temp 97.2°F | Ht 66.0 in | Wt 170.0 lb

## 2020-01-08 DIAGNOSIS — L97521 Non-pressure chronic ulcer of other part of left foot limited to breakdown of skin: Secondary | ICD-10-CM

## 2020-01-08 DIAGNOSIS — I5031 Acute diastolic (congestive) heart failure: Secondary | ICD-10-CM

## 2020-01-08 DIAGNOSIS — IMO0002 Reserved for concepts with insufficient information to code with codable children: Secondary | ICD-10-CM

## 2020-01-08 DIAGNOSIS — H7290 Unspecified perforation of tympanic membrane, unspecified ear: Secondary | ICD-10-CM | POA: Insufficient documentation

## 2020-01-08 DIAGNOSIS — E785 Hyperlipidemia, unspecified: Secondary | ICD-10-CM

## 2020-01-08 DIAGNOSIS — N184 Chronic kidney disease, stage 4 (severe): Secondary | ICD-10-CM | POA: Diagnosis not present

## 2020-01-08 DIAGNOSIS — E11319 Type 2 diabetes mellitus with unspecified diabetic retinopathy without macular edema: Secondary | ICD-10-CM

## 2020-01-08 DIAGNOSIS — I10 Essential (primary) hypertension: Secondary | ICD-10-CM

## 2020-01-08 DIAGNOSIS — H7292 Unspecified perforation of tympanic membrane, left ear: Secondary | ICD-10-CM

## 2020-01-08 DIAGNOSIS — E1165 Type 2 diabetes mellitus with hyperglycemia: Secondary | ICD-10-CM

## 2020-01-08 DIAGNOSIS — I739 Peripheral vascular disease, unspecified: Secondary | ICD-10-CM

## 2020-01-08 NOTE — Progress Notes (Signed)
Cardiology Office Note:    Date:  01/08/2020   ID:  [name], DOB 15-Dec-1950, MRN [id number]  PCP:  [name], MD  Cardiologist:  Evalina Field, MD  Electrophysiologist:  None   Referring MD: [name], MD   Chief Complaint  Patient presents with  . Follow-up  Post hospital follow up  History of Present Illness:    [name] is a 69 y.o. male with a hx of hypertension, insulin-dependent diabetes, chronic renal insufficiency stage III-IV, PAD, and noncompliance with medications.  He did have a Myoview study November 2020 that was low risk.  He presented to our office a couple weeks ago grossly volume overloaded after he stopped taking his medications.  He then was placed back on his torsemide.  He continued to have edema and shortness of breath and presented to the emergency room 12/31/2019.  He was admitted and diuresed.  He also had what appeared to be cellulitis of his left lower extremity with a heel ulcer.  He has had vascular disease and had an anterior tibial angioplasty by Dr. Carlis Abbott in October 2020.  It is felt that the left leg cellulitis was secondary to a diabetic issue as opposed to vascular insufficiency.  The patient gradually stabilized and was discharged 01/03/2020.  Discharge weight was 160 pounds.  He presents to the office today for follow-up.  Overall he feels like he is doing better.  He did have an incident where he was listening to a church band and apparently had a left tympanic membrane rupture.  He was seen in the emergency room yesterday for this.  He has an ENT visit scheduled for later in June.  Right now this seems to be his primary concern.  The swelling in his left leg is still there but it is improved.  He has only trace edema in his right leg.  His lungs are clear on exam.  Past Medical History:  Diagnosis Date  . Cellulitis and abscess of face 10/11/2007   Qualifier: Diagnosis of  By: Amil Amen MD, Benjamine Mola    . Diabetic  retinopathy ([place])   . Diabetic retinopathy associated with type 2 diabetes mellitus ([place]) 09/13/2014   He works for [employer]  . Fluid overload 09/03/2012   Post op  . GERD (gastroesophageal reflux disease)   . Visual impairment     Past Surgical History:  Procedure Laterality Date  . ABDOMINAL AORTOGRAM W/LOWER EXTREMITY Left 05/30/2019   Procedure: ABDOMINAL AORTOGRAM W/LOWER EXTREMITY;  Surgeon: Marty Heck, MD;  Location: [facility];  Service: Cardiovascular;  Laterality: Left;  . CATARACT EXTRACTION W/ INTRAOCULAR LENS  IMPLANT, BILATERAL    . EYE SURGERY Bilateral    "laser OR for diabetic retinopathy"  . INGUINAL HERNIA REPAIR     [name] 07/12/2010), "don't remember which side"  . LAPAROTOMY  08/23/2012   Procedure: EXPLORATORY LAPAROTOMY;  Surgeon: [name], DO;  [facility];  Service: General;  Laterality: N/A;  exploratory laparotomy with lysis of adhesions  . LYSIS OF ADHESION  08/23/2012   Procedure: LYSIS OF ADHESION;  Surgeon: [name], DO;  [facility];  Service: General;;  . PERIPHERAL VASCULAR BALLOON ANGIOPLASTY  05/30/2019   Procedure: PERIPHERAL VASCULAR BALLOON ANGIOPLASTY;  Surgeon: Marty Heck, MD;  Location: [facility];  Service: Cardiovascular;;  left anterior tibial  . ROBOT ASSISTED LAPAROSCOPIC RADICAL PROSTATECTOMY  2000's   "had to finish manually after machine  broke"  . SHOULDER SURGERY  1970's   separation; from playing football"    Current Medications: Current Meds  Medication Sig  . amLODipine (NORVASC) 10 MG tablet Take 1 tablet (10 mg total) by mouth daily.  [name] aspirin 81 MG chewable tablet Chew 1 tablet (81 mg total) by mouth every morning.  [name] atorvastatin (LIPITOR) 40 MG tablet Take 1 tablet (40 mg total) by mouth every morning.  . blood glucose meter kit and supplies KIT Dispense based on patient and insurance preference. Use up to four times daily as directed.  . blood glucose  meter kit and supplies Dispense based on patient and insurance preference. Use up to four times daily as directed. (FOR ICD-10 E10.9, E11.9).  [name] Blood Glucose Monitoring Suppl (RELION CONFIRM GLUCOSE MONITOR) w/Device KIT Use to check blood sugar 3 times a day.  . cefdinir (OMNICEF) 300 MG capsule Take 1 capsule (300 mg total) by mouth daily for 14 days.  . clopidogrel (PLAVIX) 75 MG tablet Take 1 tablet (75 mg total) by mouth daily with breakfast.  . doxycycline (VIBRAMYCIN) 50 MG capsule Take 2 capsules (100 mg total) by mouth 2 (two) times daily for 14 days.  [name] gabapentin (NEURONTIN) 300 MG capsule TAKE 1 CAPSULE(300 MG) BY MOUTH THREE TIMES DAILY (Patient taking differently: Take 300 mg by mouth 2 (two) times daily. Take 300 mg by mouth in the morning and 300 mg at bedtime- may take an additional 300 mg once a day as needed/as directed)  . hydrALAZINE (APRESOLINE) 25 MG tablet Take 1 tablet (25 mg total) by mouth every 8 (eight) hours.  . insulin isophane & regular human (NOVOLIN 70/30 FLEXPEN RELION) (70-30) 100 UNIT/ML KwikPen Inject 10 Units into the skin 2 (two) times daily. Further refills by PCP  . Lancets (FREESTYLE) lancets Use as instructed  . Lancets 30G MISC   . Multiple Vitamins-Minerals (CENTRUM SILVER 50+MEN) TABS Take 1 tablet by mouth daily with breakfast.  . ofloxacin (FLOXIN) 0.3 % OTIC solution Place 5 drops into the left ear daily.  Vladimir Faster Glycol-Propyl Glycol (SYSTANE OP) Place 1 drop into both eyes as needed (for dryness).   . torsemide (DEMADEX) 20 MG tablet Take 1 tablet (20 mg total) by mouth daily.  . Wound Dressings (MEDIHONEY WOUND/BURN DRESSING) GEL Apply to affected are 3 times a week, and cover with sterile dressing.     Allergies:   Ace inhibitors and Naproxen   Social History   Socioeconomic History  . Marital status: Married    Spouse name: Not on file  . Number of children: Not on file  . Years of education: Not on file  . Highest education level:  Not on file  Occupational History  . Not on file  Tobacco Use  . Smoking status: Former Smoker    Packs/day: 2.50    Years: 3.00    Pack years: 7.50    Types: Cigarettes    Quit date: 11/09/1966    Years since quitting: 53.2  . Smokeless tobacco: Never Used  Substance and Sexual Activity  . Alcohol use: Yes    Comment: 04/08/2015 "maybe a beer/ or 2 or a glass of wine monthly"  . Drug use: No  . Sexual activity: Yes  Other Topics Concern  . Not on file  Social History Narrative  . Not on file   Social Determinants of Health   Financial Resource Strain:   . Difficulty of Paying Living Expenses:   Food Insecurity:   .  Worried About Charity fundraiser in the Last Year:   . Arboriculturist in the Last Year:   Transportation Needs:   . Film/video editor (Medical):   [name] Lack of Transportation (Non-Medical):   Physical Activity:   . Days of Exercise per Week:   . Minutes of Exercise per Session:   Stress:   . Feeling of Stress :   Social Connections:   . Frequency of Communication with Friends and Family:   . Frequency of Social Gatherings with Friends and Family:   . Attends Religious Services:   . Active Member of Clubs or Organizations:   . Attends Archivist Meetings:   [name] Marital Status:      Family History: The patient's family history includes Diabetes Mellitus II in his mother. There is no history of Colon cancer.  ROS:   Please see the history of present illness.     All other systems reviewed and are negative.  EKGs/Labs/Other Studies Reviewed:    The following studies were reviewed today: Echo 01/01/2020- IMPRESSIONS    1. Left ventricular ejection fraction, by estimation, is 60 to 65%. The  left ventricle has normal function. The left ventricle has no regional  wall motion abnormalities. There is mild left ventricular hypertrophy.  Left ventricular diastolic parameters  are consistent with Grade II diastolic dysfunction  (pseudonormalization).  Elevated left ventricular end-diastolic pressure.  2. Right ventricular systolic function is normal. The right ventricular  size is normal.  3. The mitral valve is normal in structure. Mild mitral valve  regurgitation. No evidence of mitral stenosis.  4. The aortic valve is tricuspid. Aortic valve regurgitation is mild. No  aortic stenosis is present.  5. The inferior vena cava is normal in size with greater than 50%  respiratory variability, suggesting right atrial pressure of 3 mmHg.   EKG:  EKG is not ordered today.  The ekg ordered 09/25/2019 demonstrates NSR, 1st AVB- PR 323m, LVH noted  Recent Labs: 05/06/2019: TSH 1.370 06/25/2019: Magnesium 2.1 12/31/2019: ALT 21; B Natriuretic Peptide 168.3; Hemoglobin 11.1; Platelets 209 01/03/2020: BUN 32; Creatinine, Ser 2.63; Potassium 4.3; Sodium 136  Recent Lipid Panel    Component Value Date/Time   CHOL 136 05/06/2019 0520   CHOL 158 04/19/2019 1013   TRIG 47 05/06/2019 0520   HDL 53 05/06/2019 0520   HDL 53 04/19/2019 1013   CHOLHDL 2.6 05/06/2019 0520   VLDL 9 05/06/2019 0520   LDLCALC 74 05/06/2019 0520   LDLCALC 87 04/19/2019 1013    Physical Exam:    VS:  BP (!) 150/66 (BP Location: Left Arm, Patient Position: Sitting, Cuff Size: Normal)   Pulse 70   Temp (!) 97.2 F (36.2 C)   Ht '5\' 6"'  (1.676 m)   Wt 170 lb (77.1 kg)   BMI 27.44 kg/m     Wt Readings from Last 3 Encounters:  01/08/20 170 lb (77.1 kg)  01/06/20 167 lb (75.8 kg)  01/03/20 160 lb 11.2 oz (72.9 kg)     GEN:  Well nourished, well developed AA male,  in no acute distress HEENT: Normal NECK: No JVD; No carotid bruits CARDIAC: RRR, no murmurs, rubs, gallops RESPIRATORY:  Clear to auscultation without rales, wheezing or rhonchi  ABDOMEN: Soft, non-tender, non-distended MUSCULOSKELETAL:  1+ LLE edema, warm to touch. RLE trace edema. No deformity  SKIN: Warm and dry NEUROLOGIC:  Alert and oriented x 3 PSYCHIATRIC:  Normal  affect   ASSESSMENT:  Acute diastolic HF (heart failure) ([place]) Admitted 5/18-/01/03/2020. DC weight 167 lbs (down 2 lbs) He appears compensated today in the office  CRI (chronic renal insufficiency), stage 4 (severe) (HCC) GFR 27-check BMP today, torsemide is new  Essential hypertension Echo 01/01/2020- EF 60-65% with grade 2 DD Fair control of B/P  PVD (peripheral vascular disease) ([place]) H/O Lt anterior tib PTA OCT 2020-Dr Clark  Type 2 diabetes, uncontrolled, with retinopathy (HCC) IDDM with retinopathy, nephropathy, neuropathy  Uncontrolled on admission secondary to non compliance with insulin  Skin ulcer of left foot, limited to breakdown of skin ([place]) Wound center appointment this week  Tympanic membrane rupture Awaiting ENT appointment  PLAN:    Check BMP today- continue current medications. F/U with PCP, Wound Center, ENT, and Dr Carlis Abbott.  Dr Audie Box in 6-8 weeks.    Medication Adjustments/Labs and Tests Ordered: Current medicines are reviewed at length with the patient today.  Concerns regarding medicines are outlined above.  Orders Placed This Encounter  Procedures  . Basic metabolic panel   No orders of the defined types were placed in this encounter.   Patient Instructions  Medication Instructions:  Your physician recommends that you continue on your current medications as directed. Please refer to the Current Medication list given to you today.  *If you need a refill on your cardiac medications before your next appointment, please call your pharmacy*   Lab Work: Your physician recommends that you return for lab work today: BMET  If you have labs (blood work) drawn today and your tests are completely normal, you will receive your results only by: [name] MyChart Message (if you have MyChart) OR . A paper copy in the mail If you have any lab test that is abnormal or we need to change your treatment, we will call you to review the results.   Follow-Up: At  [facility], you and your health needs are our priority.  As part of our continuing mission to provide you with exceptional heart care, we have created designated Provider Care Teams.  These Care Teams include your primary Cardiologist (physician) and Advanced Practice Providers (APPs -  Physician Assistants and Nurse Practitioners) who all work together to provide you with the care you need, when you need it.  We recommend signing up for the patient portal called "MyChart".  Sign up information is provided on this After Visit Summary.  MyChart is used to connect with patients for Virtual Visits (Telemedicine).  Patients are able to view lab/test results, encounter notes, upcoming appointments, etc.  Non-urgent messages can be sent to your provider as well.   To learn more about what you can do with MyChart, go to [email or web address].    Your next appointment:   6 week(s)  The format for your next appointment:   In Person  Provider:   Eleonore Chiquito, MD      Signed, Kerin Ransom, PA-C  01/08/2020 3:29 PM    [place]

## 2020-01-08 NOTE — Assessment & Plan Note (Signed)
Echo 01/01/2020- EF 60-65% with grade 2 DD Fair control of B/P

## 2020-01-08 NOTE — Assessment & Plan Note (Signed)
IDDM with retinopathy, nephropathy, neuropathy  Uncontrolled on admission secondary to non compliance with insulin

## 2020-01-08 NOTE — Assessment & Plan Note (Signed)
GFR 27-check BMP today, torsemide is new

## 2020-01-08 NOTE — Assessment & Plan Note (Signed)
Awaiting ENT appointment

## 2020-01-08 NOTE — Assessment & Plan Note (Signed)
Admitted 5/18-/01/03/2020. DC weight 167 lbs (down 2 lbs) He appears compensated today in the office

## 2020-01-08 NOTE — Patient Instructions (Signed)
Medication Instructions:  Your physician recommends that you continue on your current medications as directed. Please refer to the Current Medication list given to you today.  *If you need a refill on your cardiac medications before your next appointment, please call your pharmacy*   Lab Work: Your physician recommends that you return for lab work today: BMET  If you have labs (blood work) drawn today and your tests are completely normal, you will receive your results only by: [name] MyChart Message (if you have MyChart) OR . A paper copy in the mail If you have any lab test that is abnormal or we need to change your treatment, we will call you to review the results.   Follow-Up: At [facility], you and your health needs are our priority.  As part of our continuing mission to provide you with exceptional heart care, we have created designated Provider Care Teams.  These Care Teams include your primary Cardiologist (physician) and Advanced Practice Providers (APPs -  Physician Assistants and Nurse Practitioners) who all work together to provide you with the care you need, when you need it.  We recommend signing up for the patient portal called "MyChart".  Sign up information is provided on this After Visit Summary.  MyChart is used to connect with patients for Virtual Visits (Telemedicine).  Patients are able to view lab/test results, encounter notes, upcoming appointments, etc.  Non-urgent messages can be sent to your provider as well.   To learn more about what you can do with MyChart, go to [email or web address].    Your next appointment:   6 week(s)  The format for your next appointment:   In Person  Provider:   Eleonore Chiquito, MD

## 2020-01-08 NOTE — Assessment & Plan Note (Signed)
Wound center appointment this week

## 2020-01-08 NOTE — Assessment & Plan Note (Signed)
H/O Lt anterior tib PTA OCT 2020-Dr Carlis Abbott

## 2020-01-09 ENCOUNTER — Encounter: Payer: Self-pay | Admitting: Family Medicine

## 2020-01-09 ENCOUNTER — Ambulatory Visit (INDEPENDENT_AMBULATORY_CARE_PROVIDER_SITE_OTHER): Payer: Medicare Other | Admitting: Family Medicine

## 2020-01-09 ENCOUNTER — Telehealth: Payer: Self-pay | Admitting: Family Medicine

## 2020-01-09 ENCOUNTER — Encounter (HOSPITAL_BASED_OUTPATIENT_CLINIC_OR_DEPARTMENT_OTHER): Payer: Medicare Other | Admitting: Internal Medicine

## 2020-01-09 VITALS — BP 195/75 | HR 71 | Temp 98.6°F | Ht 66.0 in | Wt 171.0 lb

## 2020-01-09 DIAGNOSIS — H7292 Unspecified perforation of tympanic membrane, left ear: Secondary | ICD-10-CM | POA: Diagnosis not present

## 2020-01-09 DIAGNOSIS — E1165 Type 2 diabetes mellitus with hyperglycemia: Secondary | ICD-10-CM

## 2020-01-09 DIAGNOSIS — M14672 Charcot's joint, left ankle and foot: Secondary | ICD-10-CM | POA: Diagnosis not present

## 2020-01-09 DIAGNOSIS — N184 Chronic kidney disease, stage 4 (severe): Secondary | ICD-10-CM

## 2020-01-09 DIAGNOSIS — F5104 Psychophysiologic insomnia: Secondary | ICD-10-CM

## 2020-01-09 DIAGNOSIS — N189 Chronic kidney disease, unspecified: Secondary | ICD-10-CM | POA: Diagnosis not present

## 2020-01-09 DIAGNOSIS — K219 Gastro-esophageal reflux disease without esophagitis: Secondary | ICD-10-CM | POA: Diagnosis not present

## 2020-01-09 DIAGNOSIS — E1122 Type 2 diabetes mellitus with diabetic chronic kidney disease: Secondary | ICD-10-CM | POA: Diagnosis not present

## 2020-01-09 DIAGNOSIS — I10 Essential (primary) hypertension: Secondary | ICD-10-CM | POA: Diagnosis not present

## 2020-01-09 DIAGNOSIS — Z597 Insufficient social insurance and welfare support: Secondary | ICD-10-CM

## 2020-01-09 DIAGNOSIS — Z8546 Personal history of malignant neoplasm of prostate: Secondary | ICD-10-CM | POA: Diagnosis not present

## 2020-01-09 DIAGNOSIS — E1151 Type 2 diabetes mellitus with diabetic peripheral angiopathy without gangrene: Secondary | ICD-10-CM | POA: Diagnosis not present

## 2020-01-09 DIAGNOSIS — E11621 Type 2 diabetes mellitus with foot ulcer: Secondary | ICD-10-CM | POA: Diagnosis not present

## 2020-01-09 DIAGNOSIS — E1140 Type 2 diabetes mellitus with diabetic neuropathy, unspecified: Secondary | ICD-10-CM | POA: Diagnosis not present

## 2020-01-09 DIAGNOSIS — E1142 Type 2 diabetes mellitus with diabetic polyneuropathy: Secondary | ICD-10-CM | POA: Diagnosis not present

## 2020-01-09 DIAGNOSIS — L97522 Non-pressure chronic ulcer of other part of left foot with fat layer exposed: Secondary | ICD-10-CM | POA: Diagnosis present

## 2020-01-09 DIAGNOSIS — L97521 Non-pressure chronic ulcer of other part of left foot limited to breakdown of skin: Secondary | ICD-10-CM | POA: Diagnosis not present

## 2020-01-09 LAB — BASIC METABOLIC PANEL
BUN/Creatinine Ratio: 16 (ref 10–24)
BUN: 40 mg/dL — ABNORMAL HIGH (ref 8–27)
CO2: 26 mmol/L (ref 20–29)
Calcium: 9.6 mg/dL (ref 8.6–10.2)
Chloride: 98 mmol/L (ref 96–106)
Creatinine, Ser: 2.54 mg/dL — ABNORMAL HIGH (ref 0.76–1.27)
GFR calc Af Amer: 28 mL/min/{1.73_m2} — ABNORMAL LOW (ref >59)
GFR calc non Af Amer: 25 mL/min/{1.73_m2} — ABNORMAL LOW (ref >59)
Glucose: 415 mg/dL — ABNORMAL HIGH (ref 65–99)
Potassium: 5.1 mmol/L (ref 3.5–5.2)
Sodium: 138 mmol/L (ref 134–144)

## 2020-01-09 MED ORDER — TRAZODONE HCL 50 MG PO TABS: 25.0000 mg | ORAL_TABLET | Freq: Every evening | ORAL | 3 refills | Status: DC | PRN

## 2020-01-09 MED ORDER — "INSULIN SYRINGE 31G X 5/16"" 0.5 ML MISC": 3 refills | Status: DC

## 2020-01-09 MED ORDER — INSULIN NPH ISOPHANE & REGULAR (70-30) 100 UNIT/ML ~~LOC~~ SUSP: 10.0000 [IU] | Freq: Two times a day (BID) | SUBCUTANEOUS | 11 refills | Status: DC

## 2020-01-09 MED ORDER — HYDRALAZINE HCL 25 MG PO TABS: 25.0000 mg | ORAL_TABLET | Freq: Three times a day (TID) | ORAL | 6 refills | Status: DC

## 2020-01-09 MED ORDER — GABAPENTIN 300 MG PO CAPS: 300.0000 mg | ORAL_CAPSULE | Freq: Two times a day (BID) | ORAL | 5 refills | Status: DC

## 2020-01-09 MED ORDER — CLOPIDOGREL BISULFATE 75 MG PO TABS: 75.0000 mg | ORAL_TABLET | Freq: Every day | ORAL | 3 refills | Status: DC

## 2020-01-09 NOTE — Patient Instructions (Signed)
° ° ° °  If you have lab work done today you will be contacted with your lab results within the next 2 weeks.  If you have not heard from us then please contact us. The fastest way to get your results is to register for My Chart. ° ° °IF you received an x-ray today, you will receive an invoice from [facility]. Please contact [facility] at [phone] with questions or concerns regarding your invoice.  ° °IF you received labwork today, you will receive an invoice from [facility]. Please contact [facility] at [phone] with questions or concerns regarding your invoice.  ° °Our billing staff will not be able to assist you with questions regarding bills from these companies. ° °You will be contacted with the lab results as soon as they are available. The fastest way to get your results is to activate your My Chart account. Instructions are located on the last page of this paperwork. If you have not heard from us regarding the results in 2 weeks, please contact this office. °  ° ° ° °

## 2020-01-09 NOTE — Telephone Encounter (Signed)
[facility]  is calling please resend with amended instructions  gabapentin (NEURONTIN) 300 MG capsule [[id number]]   Order Details Dose: 300 mg Route: Oral Frequency: 2 times daily  Dispense Quantity: 90 capsule Refills: 5       Sig: Take 1 capsule (300 mg total) by mouth 2 (two) times daily. TAKE 1 CAPSULE(300 MG) BY MOUTH THREE TIMES DAILY        Please resend asap

## 2020-01-09 NOTE — Telephone Encounter (Signed)
Sent message to pcp to clarify the sig on gabapentin rx

## 2020-01-09 NOTE — Progress Notes (Signed)
[place], [place] ([id number]) Visit Report for 01/09/2020 Debridement Details Patient Name: Date of Service: [name] [facility] [place] L. 01/09/2020 9:30 A M Medical Record Number: [id number] Patient Account Number: [id number] Date of Birth/Sex: Treating RN: 06/24/1951 (69 y.o. [name] Primary Care Provider: [name] Other Clinician: Referring Provider: Treating Provider/Extender: Mitchell Heir Weeks in Treatment: 11 Debridement Performed for Assessment: Wound #3 Left,Plantar Foot Performed By: Physician Tobi Bastos, MD Debridement Type: Debridement Severity of Tissue Pre Debridement: Fat layer exposed Level of Consciousness (Pre-procedure): Awake and Alert Pre-procedure Verification/Time Out Yes - 10:00 Taken: Start Time: 10:01 Pain Control: Lidocaine 4% T opical Solution T Area Debrided (L x W): otal 0.5 (cm) x 0.5 (cm) = 0.25 (cm) Tissue and other material debrided: Non-Viable, Callus Level: Non-Viable Tissue Debridement Description: Selective/Open Wound Instrument: Curette Bleeding: None End Time: 10:06 Procedural Pain: 0 Post Procedural Pain: 0 Response to Treatment: Procedure was tolerated well Level of Consciousness (Post- Awake and Alert procedure): Post Debridement Measurements of Total Wound Length: (cm) 1.5 Width: (cm) 1.7 Depth: (cm) 0.1 Volume: (cm) 0.2 Character of Wound/Ulcer Post Debridement: Improved Severity of Tissue Post Debridement: Fat layer exposed Post Procedure Diagnosis Same as Pre-procedure Electronic Signature(s) Signed: 01/09/2020 4:57:23 PM By: Tobi Bastos MD, MBA Signed: 01/09/2020 5:42:52 PM By: Deon Pilling Entered By: Deon Pilling on 01/09/2020 10:06:19 -------------------------------------------------------------------------------- Debridement Details Patient Name: Date of Service: [name] ND L. 01/09/2020 9:30 A M Medical Record Number: [id number] Patient Account Number: [id number] Date of  Birth/Sex: Treating RN: 10-19-1950 (69 y.o. [name] Primary Care Provider: [name] Other Clinician: Referring Provider: Treating Provider/Extender: Mitchell Heir Weeks in Treatment: 11 Debridement Performed for Assessment: Wound #4 Left,Lateral Foot Performed By: Physician Tobi Bastos, MD Debridement Type: Debridement Severity of Tissue Pre Debridement: Fat layer exposed Level of Consciousness (Pre-procedure): Awake and Alert Pre-procedure Verification/Time Out Yes - 10:00 Taken: Start Time: 10:01 Pain Control: Lidocaine 4% T opical Solution T Area Debrided (L x W): otal 0.5 (cm) x 0.5 (cm) = 0.25 (cm) Tissue and other material debrided: Non-Viable, Callus Level: Non-Viable Tissue Debridement Description: Selective/Open Wound Instrument: Curette Bleeding: None End Time: 10:06 Procedural Pain: 0 Post Procedural Pain: 0 Response to Treatment: Procedure was tolerated well Level of Consciousness (Post- Awake and Alert procedure): Post Debridement Measurements of Total Wound Length: (cm) 0.4 Width: (cm) 0.5 Depth: (cm) 0.3 Volume: (cm) 0.047 Character of Wound/Ulcer Post Debridement: Improved Severity of Tissue Post Debridement: Fat layer exposed Post Procedure Diagnosis Same as Pre-procedure Electronic Signature(s) Signed: 01/09/2020 4:57:23 PM By: Tobi Bastos MD, MBA Signed: 01/09/2020 5:42:52 PM By: Deon Pilling Entered By: Deon Pilling on 01/09/2020 10:07:38 -------------------------------------------------------------------------------- HPI Details Patient Name: Date of Service: [name] [facility] ND L. 01/09/2020 9:30 A M Medical Record Number: [id number] Patient Account Number: [id number] Date of Birth/Sex: Treating RN: May 15, 1951 (69 y.o. [name] Primary Care Provider: [name] Other Clinician: Referring Provider: Treating Provider/Extender: Mitchell Heir Weeks in Treatment: 11 History of  Present Illness HPI Description: ADMISSION 10/18/2019 Patient is a 69 year old man with type 2 diabetes peripheral neuropathy and a Charcot foot. The problem I think began in March 2020 just about a year ago when he was admitted to hospital from 10/19/2018 through 10/24/2018 with a wound on his left lateral foot intense cellulitis. MRI did not suggest osteomyelitis but did note a draining sinus. He apparently received a prolonged course of antibiotics. His hemoglobin A1c at that time was 13.9.  He was referred to Dr. [name] where he appears to been followed for probably 5 or 6 months. He was subsequently referred himself to Dr. [name] of podiatry. He has been offloaded in a surgical shoe with a Pegasys sole. He has been mostly using Medihoney and episodic debridement of intense surrounding callus. He has been referred here for further management of this difficult the heel wound area and consideration of hyperbaric oxygen The patient had an angiogram in November. He did not have any proximal stenoses but he did have a 70 to 80% peroneal artery occlusion as well as an 80 to 95% stenosis of the anterior tibia which underwent an angioplasty. Follow-up ABIs were done on 07/02/2019 showed an ABI on the right of 1.26 on the left at 1.28 with TBI's of 0.65 and 0.67 respectively. Waveforms were triphasic Past medical history includes type 2 diabetes with neuropathy, PAD and retinopathy, prostate cancer, gastroesophageal reflux disease, chronic kidney disease, small bowel obstruction 10/25/2019. Patient's x-ray of the left foot from last time showed no evidence of osteomyelitis. Chronic Charcot/neuropathic changes as expected. Notable soft tissue ulcer involving the plantar aspect underlying the forefoot. The patient actually has 2 wounds. One on the mid forefoot limited to breakdown of skin. The larger area is actually on the lateral foot. We have been using silver alginate to both wound areas. 3/15; patient came  back for the obligatory first total contact cast change. He was apparently walking on the cast without the boot but otherwise tolerated it well and per our intake nurse the wounds look satisfactory. He will be back later in the week for his wound care visit 3/18; patient's wound on the plantar foot looks smaller of lateral foot about the same. Both required debridement. There was some macerated tissue between the 2 wounds which were also removed. We have been using silver alginate. 3/26 1 week follow-up. T contact cast. Plantar foot wound has closed over. The area on the lateral part of his foot is still open but smaller. Using silver otal alginate on the wounds 11/15/2019 upon evaluation today patient appears to be doing well with regard to his wound. He is making progress and fortunately the wound is measuring smaller and looking better at this point. Fortunately there is no signs of active infection which is also good news we been using a total contact cast up to this point. 4/9; small wound now on the left lateral foot not technically on the plantar surface although I think he may be somebody who walks partially on the outside of his foot. The more extensive wound on the plantar foot healed 2 weeks ago. The patient tells me that his mother is recently passed away. He wants the total contact cast off Week in preparation for a funeral in [place]. We'll bring him back in on Wednesday to try and accommodate this. We also changed the [facility] might give [place] a quick chance to have a look at this on that date 4/13; patient is here for a total contact cast change. The wound looks quite healthy we reapplied Hydrofera Blue. He will be back next week for review 4/20; the patient's wound is slightly smaller. The area on the plantar foot remains healed. We have been using Hydrofera Blue 4/27 patient's wound is about half the size. Using Hydrofera Blue the plantar foot remains healed. Blood  pressure very high today 5/4; the patient's wound is totally healed. Type 2 diabetes with a Charcot foot. Healed out in a  total contact cast. He has a diabetic shoe with custom inserts that he got at [facility]. 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 -------------------------------------------------------------------------------------- [place] 01/09/20 -Patient's wound has opened up again on his left plantar foot with extension of hematoma to the left lateral edge. Patient denies any fevers or chills. It appears that he has been wearing regular shoes although he states most of the time he is using his custom fitted boot that he has had for 7 months Electronic Signature(s) Signed: 01/09/2020 10:11:02 AM By: Tobi Bastos MD, MBA Entered By: Tobi Bastos on 01/09/2020 10:11:01 -------------------------------------------------------------------------------- Physical Exam Details Patient Name: Date of Service: [name] ND L. 01/09/2020 9:30 A M Medical Record Number: [id number] Patient Account Number: [id number] Date of Birth/Sex: Treating RN: 05-29-1951 (69 y.o. [name] Primary Care Provider: [name] Other Clinician: Referring Provider: Treating Provider/Extender: Mitchell Heir Weeks in Treatment: 11 Constitutional alert and oriented x 3. sitting or standing blood pressure is within target range for patient.. supine blood pressure is within target range for patient.. pulse regular and within target range for patient.[name] respirations regular, non-labored and within target range for patient.[name] temperature within target range for patient.. . . Well- nourished and well-hydrated in no acute distress. Notes Left plantar foot wound clean base, surrounding macerated tissue some of this was  debrided, wound base has no slough, there is devitalized skin with hematoma at the lateral edge, some of this area was debrided off macerated tissue there is a very superficial area open here this area also is tender on the lateral foot Electronic Signature(s) Signed: 01/09/2020 10:12:01 AM By: Tobi Bastos MD, MBA Entered By: Tobi Bastos on 01/09/2020 10:12:01 -------------------------------------------------------------------------------- Physician Orders Details Patient Name: Date of Service: [name] ND L. 01/09/2020 9:30 A M Medical Record Number: [id number] Patient Account Number: [id number] Date of Birth/Sex: Treating RN: [birth year] (69 y.o. [name] Primary Care Provider: [name] Other Clinician: Referring Provider: Treating Provider/Extender: Mitchell Heir Weeks in Treatment: 33 Verbal / Phone Orders: No Diagnosis Coding ICD-10 Coding Code Description [phone] Non-pressure chronic ulcer of other part of left foot with fat layer exposed E11.51 Type 2 diabetes mellitus with diabetic peripheral angiopathy without gangrene E11.621 Type 2 diabetes mellitus with foot ulcer L97.521 Non-pressure chronic ulcer of other part of left foot limited to breakdown of skin M14.672 Charcot's joint, left ankle and foot E11.40 Type 2 diabetes mellitus with diabetic neuropathy, unspecified Follow-up Appointments ppointment in 1 week. - T Contact Cast otal Return A Dressing Change Frequency Do not change entire dressing for one week. Wound Cleansing May shower with protection. Primary Wound Dressing Wound #3 Left,Plantar Foot Calcium Alginate with Silver Wound #4 Left,Lateral Foot Calcium Alginate with Silver Secondary Dressing Foam - foam donut Dry Gauze Edema Control Avoid standing for long periods of time Elevate legs to the level of the heart or above for 30 minutes daily and/or when sitting, a frequency of: - throughout the  day. Off-Loading Total Contact Cast to Left Lower Extremity Electronic Signature(s) Signed: 01/09/2020 4:57:23 PM By: Tobi Bastos MD, MBA Signed: 01/09/2020 5:42:52 PM By: Deon Pilling Entered By: Deon Pilling on 01/09/2020 10:08:56 -------------------------------------------------------------------------------- Problem List Details Patient Name: Date of Service: Granville Lewis, RA YMO ND L. 01/09/2020  9:30 A M Medical Record Number: [id number] Patient Account Number: [id number] Date of Birth/Sex: Treating RN: [birth year] (69 y.o. Lorette Ang, Tammi Klippel Primary Care Provider: [name] Other Clinician: Referring Provider: Treating Provider/Extender: Mitchell Heir Weeks in Treatment: 11 Active Problems ICD-10 Encounter Code Description Active Date MDM Diagnosis L97.522 Non-pressure chronic ulcer of other part of left foot with fat layer exposed 10/18/2019 No Yes E11.51 Type 2 diabetes mellitus with diabetic peripheral angiopathy without gangrene 10/18/2019 No Yes E11.621 Type 2 diabetes mellitus with foot ulcer 10/18/2019 No Yes L97.521 Non-pressure chronic ulcer of other part of left foot limited to breakdown of 10/25/2019 No Yes skin M14.672 Charcot's joint, left ankle and foot 10/18/2019 No Yes E11.40 Type 2 diabetes mellitus with diabetic neuropathy, unspecified 10/18/2019 No Yes Inactive Problems Resolved Problems Electronic Signature(s) Signed: 01/09/2020 4:57:23 PM By: Tobi Bastos MD, MBA Signed: 01/09/2020 5:42:52 PM By: Deon Pilling Entered By: Deon Pilling on 01/09/2020 10:02:24 -------------------------------------------------------------------------------- Progress Note Details Patient Name: Date of Service: [name] ND L. 01/09/2020 9:30 A M Medical Record Number: [id number] Patient Account Number: [id number] Date of Birth/Sex: Treating RN: 1951/02/26 (69 y.o. [name] Primary Care Provider: [name] Other Clinician: Referring  Provider: Treating Provider/Extender: Mitchell Heir Weeks in Treatment: 11 Subjective History of Present Illness (HPI) ADMISSION 10/18/2019 Patient is a 69 year old man with type 2 diabetes peripheral neuropathy and a Charcot foot. The problem I think began in March 2020 just about a year ago when he was admitted to hospital from 10/19/2018 through 10/24/2018 with a wound on his left lateral foot intense cellulitis. MRI did not suggest osteomyelitis but did note a draining sinus. He apparently received a prolonged course of antibiotics. His hemoglobin A1c at that time was 13.9. He was referred to Dr. [name] where he appears to been followed for probably 5 or 6 months. He was subsequently referred himself to Dr. [name] of podiatry. He has been offloaded in a surgical shoe with a Pegasys sole. He has been mostly using Medihoney and episodic debridement of intense surrounding callus. He has been referred here for further management of this difficult the heel wound area and consideration of hyperbaric oxygen The patient had an angiogram in November. He did not have any proximal stenoses but he did have a 70 to 80% peroneal artery occlusion as well as an 80 to 95% stenosis of the anterior tibia which underwent an angioplasty. Follow-up ABIs were done on 07/02/2019 showed an ABI on the right of 1.26 on the left at 1.28 with TBI's of 0.65 and 0.67 respectively. Waveforms were triphasic Past medical history includes type 2 diabetes with neuropathy, PAD and retinopathy, prostate cancer, gastroesophageal reflux disease, chronic kidney disease, small bowel obstruction 10/25/2019. Patient's x-ray of the left foot from last time showed no evidence of osteomyelitis. Chronic Charcot/neuropathic changes as expected. Notable soft tissue ulcer involving the plantar aspect underlying the forefoot. The patient actually has 2 wounds. One on the mid forefoot limited to breakdown of skin. The larger area is  actually on the lateral foot. We have been using silver alginate to both wound areas. 3/15; patient came back for the obligatory first total contact cast change. He was apparently walking on the cast without the boot but otherwise tolerated it well and per our intake nurse the wounds look satisfactory. He will be back later in the week for his wound care visit 3/18; patient's wound on the plantar foot looks smaller of lateral foot about the same.  Both required debridement. There was some macerated tissue between the 2 wounds which were also removed. We have been using silver alginate. 3/26 1 week follow-up. T contact cast. Plantar foot wound has closed over. The area on the lateral part of his foot is still open but smaller. Using silver otal alginate on the wounds 11/15/2019 upon evaluation today patient appears to be doing well with regard to his wound. He is making progress and fortunately the wound is measuring smaller and looking better at this point. Fortunately there is no signs of active infection which is also good news we been using a total contact cast up to this point. 4/9; small wound now on the left lateral foot not technically on the plantar surface although I think he may be somebody who walks partially on the outside of his foot. The more extensive wound on the plantar foot healed 2 weeks ago. The patient tells me that his mother is recently passed away. He wants the total contact cast off Week in preparation for a funeral in [place]. We'll bring him back in on Wednesday to try and accommodate this. We also changed the [facility] might give [place] a quick chance to have a look at this on that date 4/13; patient is here for a total contact cast change. The wound looks quite healthy we reapplied Hydrofera Blue. He will be back next week for review 4/20; the patient's wound is slightly smaller. The area on the plantar foot remains healed. We have been using Hydrofera Blue 4/27  patient's wound is about half the size. Using Hydrofera Blue the plantar foot remains healed. Blood pressure very high today 5/4; the patient's wound is totally healed. Type 2 diabetes with a Charcot foot. Healed out in a total contact cast. He has a diabetic shoe with custom inserts that he got at [facility]. 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 -------------------------------------------------------------------------------------- [place] 01/09/20 -Patient's wound has opened up again on his left plantar foot with extension of hematoma to the left lateral edge. Patient denies any fevers or chills. It appears that he has been wearing regular shoes although he states most of the time he is using his custom fitted boot that he has had for 7 months Objective Constitutional alert and oriented x 3. sitting or standing blood pressure is within target range for patient.. supine blood pressure is within target range for patient.. pulse regular and within target range for patient.[name] respirations regular, non-labored and within target range for patient.[name] temperature within target range for patient.. Well- nourished and well-hydrated in no acute distress. Vitals Time Taken: 9:38 AM, Height: 67 in, Weight: 173 lbs, BMI: 27.1, Temperature: 98.2 F, Pulse: 72 bpm, Respiratory Rate: 18 breaths/min, Blood Pressure: 180/64 mmHg. General Notes: Left plantar foot wound clean base, surrounding macerated tissue some of this was debrided, wound base has no slough, there is devitalized skin with hematoma at the lateral edge, some of this area was debrided off macerated tissue there is a very superficial area open here this area also is tender on the lateral foot Integumentary (Hair, Skin) Wound #3 status is Open. Original cause of wound was Gradually  Appeared. The wound is located on the [place]. The wound measures 1.5cm length x 1.7cm width x 0.1cm depth; 2.003cm^2  area and 0.2cm^3 volume. There is Fat Layer (Subcutaneous Tissue) Exposed exposed. There is no tunneling or undermining noted. There is a small amount of serosanguineous drainage noted. The wound margin is distinct with the outline attached to the wound base. There is large (67-100%) red granulation within the wound bed. General Notes: periwound macerated Wound #4 status is Open. Original cause of wound was Gradually Appeared. The wound is located on the Left,Lateral Foot. The wound measures 0.4cm length x 0.5cm width x 0.3cm depth; 0.157cm^2 area and 0.047cm^3 volume. There is Fat Layer (Subcutaneous Tissue) Exposed exposed. There is no tunneling noted, however, there is undermining starting at 12:00 and ending at 12:00 with a maximum distance of 1.2cm. There is a medium amount of serosanguineous drainage noted. The wound margin is well defined and not attached to the wound base. There is medium (34-66%) pink granulation within the wound bed. There is a medium (34-66%) amount of necrotic tissue within the wound bed including Adherent Slough. Assessment Active Problems ICD-10 Non-pressure chronic ulcer of other part of left foot with fat layer exposed Type 2 diabetes mellitus with diabetic peripheral angiopathy without gangrene Type 2 diabetes mellitus with foot ulcer Non-pressure chronic ulcer of other part of left foot limited to breakdown of skin Charcot's joint, left ankle and foot Type 2 diabetes mellitus with diabetic neuropathy, unspecified Procedures Wound #3 Pre-procedure diagnosis of Wound #3 is a Diabetic Wound/Ulcer of the Lower Extremity located on the Left,Plantar Foot .Severity of Tissue Pre Debridement is: Fat layer exposed. There was a Selective/Open Wound Non-Viable Tissue Debridement with a total area of 0.25 sq cm performed by Tobi Bastos,  MD. With the following instrument(s): Curette to remove Non-Viable tissue/material. Material removed includes Callus after achieving pain control using Lidocaine 4% T opical Solution. A time out was conducted at 10:00, prior to the start of the procedure. There was no bleeding. The procedure was tolerated well with a pain level of 0 throughout and a pain level of 0 following the procedure. Post Debridement Measurements: 1.5cm length x 1.7cm width x 0.1cm depth; 0.2cm^3 volume. Character of Wound/Ulcer Post Debridement is improved. Severity of Tissue Post Debridement is: Fat layer exposed. Post procedure Diagnosis Wound #3: Same as Pre-Procedure Pre-procedure diagnosis of Wound #3 is a Diabetic Wound/Ulcer of the Lower Extremity located on the Left,Plantar Foot . There was a T Contact Cast otal Procedure by Tobi Bastos, MD. Post procedure Diagnosis Wound #3: Same as Pre-Procedure Wound #4 Pre-procedure diagnosis of Wound #4 is a Diabetic Wound/Ulcer of the Lower Extremity located on the Left,Lateral Foot .Severity of Tissue Pre Debridement is: Fat layer exposed. There was a Selective/Open Wound Non-Viable Tissue Debridement with a total area of 0.25 sq cm performed by Tobi Bastos, MD. With the following instrument(s): Curette to remove Non-Viable tissue/material. Material removed includes Callus after achieving pain control using Lidocaine 4% T opical Solution. A time out was conducted at 10:00, prior to the start of the procedure. There was no bleeding. The procedure was tolerated well with a pain level of 0 throughout and a pain level of 0 following the procedure. Post Debridement Measurements: 0.4cm length x 0.5cm width x 0.3cm depth; 0.047cm^3 volume. Character of Wound/Ulcer Post Debridement is improved. Severity of Tissue Post Debridement is: Fat layer exposed. Post procedure Diagnosis Wound #4: Same as Pre-Procedure Pre-procedure diagnosis of Wound #4 is a Diabetic Wound/Ulcer of  the Lower Extremity located on the Left,Lateral Foot . There was a T Contact Cast otal Procedure by Jaquarious Grey,  Odelia Gage, MD. Post procedure Diagnosis Wound #4: Same as Pre-Procedure Plan Follow-up Appointments: Return Appointment in 1 week. - T Contact Cast otal Dressing Change Frequency: Do not change entire dressing for one week. Wound Cleansing: May shower with protection. Primary Wound Dressing: Wound #3 Left,Plantar Foot: Calcium Alginate with Silver Wound #4 Left,Lateral Foot: Calcium Alginate with Silver Secondary Dressing: Foam - foam donut Dry Gauze Edema Control: Avoid standing for long periods of time Elevate legs to the level of the heart or above for 30 minutes daily and/or when sitting, a frequency of: - throughout the day. Off-Loading: T Contact Cast to Left Lower Extremity otal -Unfortunately wound is opened up again and patient had healed up nicely while in a total contact cast -We will start with silver alginate packing to the new wounds and placement of total contact cast again, patient has no signs of infection in the foot, the hematoma is contained to the left lateral edge of the foot, and seems to be marginal -Patient will be seen next week in the clinic Electronic Signature(s) Signed: 01/09/2020 10:13:01 AM By: Tobi Bastos MD, MBA Entered By: Tobi Bastos on 01/09/2020 10:13:01 -------------------------------------------------------------------------------- Total Contact Cast Details Patient Name: Date of Service: [name] ND L. 01/09/2020 9:30 A M Medical Record Number: [id number] Patient Account Number: [id number] Date of Birth/Sex: Treating RN: 1951/06/02 (69 y.o. [name] Primary Care Provider: [name] Other Clinician: Referring Provider: Treating Provider/Extender: Mitchell Heir Weeks in Treatment: 11 T Contact Cast Applied for Wound Assessment: otal Wound #3 Left,Plantar Foot Performed By: Physician  Tobi Bastos, MD Post Procedure Diagnosis Same as Pre-procedure Electronic Signature(s) Signed: 01/09/2020 4:57:23 PM By: Tobi Bastos MD, MBA Signed: 01/09/2020 5:42:52 PM By: Deon Pilling Entered By: Deon Pilling on 01/09/2020 10:07:49 -------------------------------------------------------------------------------- Total Contact Cast Details Patient Name: Date of Service: [name] ND L. 01/09/2020 9:30 A M Medical Record Number: [id number] Patient Account Number: [id number] Date of Birth/Sex: Treating RN: 22-Jul-1951 (69 y.o. [name] Primary Care Provider: [name] Other Clinician: Referring Provider: Treating Provider/Extender: Mitchell Heir Weeks in Treatment: 11 T Contact Cast Applied for Wound Assessment: otal Wound #4 Left,Lateral Foot Performed By: Physician Tobi Bastos, MD Post Procedure Diagnosis Same as Pre-procedure Electronic Signature(s) Signed: 01/09/2020 4:57:23 PM By: Tobi Bastos MD, MBA Signed: 01/09/2020 5:42:52 PM By: Deon Pilling Entered By: Deon Pilling on 01/09/2020 10:07:50 -------------------------------------------------------------------------------- SuperBill Details Patient Name: Date of Service: [name] ND L. 01/09/2020 Medical Record Number: [id number] Patient Account Number: [id number] Date of Birth/Sex: Treating RN: 1950-09-04 (69 y.o. [name] Primary Care Provider: [name] Other Clinician: Referring Provider: Treating Provider/Extender: Mitchell Heir Weeks in Treatment: 11 Diagnosis Coding ICD-10 Codes Code Description [phone] Non-pressure chronic ulcer of other part of left foot with fat layer exposed E11.51 Type 2 diabetes mellitus with diabetic peripheral angiopathy without gangrene E11.621 Type 2 diabetes mellitus with foot ulcer L97.521 Non-pressure chronic ulcer of other part of left foot limited to breakdown of skin M14.672 Charcot's  joint, left ankle and foot E11.40 Type 2 diabetes mellitus with diabetic neuropathy, unspecified Facility Procedures CPT4 Code: [id number] Description: [phone] - DEBRIDE WOUND 1ST 20 SQ CM OR < ICD-10 Diagnosis Description L97.522 Non-pressure chronic ulcer of other part of left foot with fat layer exposed Modifier: Quantity: 1 Physician Procedures : CPT4 Code Description Modifier [id number] - WC PHYS DEBR WO ANESTH 20 SQ CM ICD-10 Diagnosis Description L97.522 Non-pressure chronic ulcer of other  part of left foot with fat layer exposed Quantity: 1 Electronic Signature(s) Signed: 01/09/2020 10:13:13 AM By: Tobi Bastos MD, MBA Entered By: Tobi Bastos on 01/09/2020 10:13:12

## 2020-01-09 NOTE — Progress Notes (Signed)
5/27/20212:21 PM  [name] April 12, 1951, 69 y.o., male [id number]  Chief Complaint  Patient presents with  . Diabetes    rx needles for novolin 70/30 kiwkpen/ L foot DM ulcer  . ER follow up    ruptured tympanic mem. L ear  . Hypertension    HPI:   Patient is a 69 y.o. male with past medical history significant for HTN, dCHF, NW2NFAO complications,CKD4, HLP, PAD  who presents today for hosp followup  Last OV Nov 2020  Patient with very limited social support network and finances Mother passed away, back with his wife, not working due to nonhealing DM foot ulcer, not sleeping well, anxious, requesting medication to help with sleep  hosp 5/18-21 for dCHF exacerbation, LLE cellulitis Echo EF 60-65%, G2DD Changes lasix to torsemide, added hydralazine No ACE or ARB due to CKD4 Doxy and cefdinir Dc weight 167 Restarted 70/30 due to cost Saw cards yesterday A1c 13.0, crt 2.54, GFR 28, K 5.1, glc 415  ER 5/24 - ruptured L ear TM Started otic abx Has appt with ENT on June 14th  Patient is not doing well Very frustrated with hearing loss and pain from ruptured TM He is doing drops  He continues to see cards, endo, podiatry, wound care He has not seen vasc surg since late 2020  He continues to struggle with finances for meds He has a vial of insulin but no syringes, so has not been using any DM meds  He has not been using plavix, hydralazine nor gabapentin He is doing torsemide 155m 1/2 tab daily  He has a new boot from wound care   [facility]2019 06/21/2019 06/03/2019  Decreased Interest 0 0 0  Down, Depressed, Hopeless 1 0 0  PHQ - 2 Score 1 0 0  Some recent data might be hidden    Fall Risk  06/21/2019 06/03/2019 05/17/2019 04/19/2019 02/07/2019  Falls in the past year? 0 0 0 0 0  Number falls in past yr: 0 0 0 0 0  Injury with Fall? 0 0 0 0 0  Follow up Falls evaluation completed - - - -     Allergies  Allergen Reactions  . Ace  Inhibitors Itching and Cough  . Naproxen Anaphylaxis, Shortness Of Breath and Other (See Comments)    Throat swells. cannot breathe, and causes GI distress    Prior to Admission medications   Medication Sig Start Date End Date Taking? Authorizing Provider  amLODipine (NORVASC) 10 MG tablet Take 1 tablet (10 mg total) by mouth daily. 12/26/19  Yes O'Neal, WCassie Freer MD  aspirin 81 MG chewable tablet Chew 1 tablet (81 mg total) by mouth every morning. 09/25/19  Yes O'Neal, WCassie Freer MD  atorvastatin (LIPITOR) 40 MG tablet Take 1 tablet (40 mg total) by mouth every morning. 12/26/19  Yes O'Neal, WCassie Freer MD  blood glucose meter kit and supplies KIT Dispense based on patient and insurance preference. Use up to four times daily as directed. 09/10/19  Yes GPhilemon Kingdom MD  blood glucose meter kit and supplies Dispense based on patient and insurance preference. Use up to four times daily as directed. (FOR ICD-10 E10.9, E11.9). 01/03/20  Yes Bhagat, Bhavinkumar, PA  Blood Glucose Monitoring Suppl (RELION CONFIRM GLUCOSE MONITOR) w/Device KIT Use to check blood sugar 3 times a day. 09/10/19  Yes GPhilemon Kingdom MD  cefdinir (OMNICEF) 300 MG capsule Take 1 capsule (300 mg total) by mouth daily for 14 days. 01/03/20  01/17/20 Yes Bhagat, Bhavinkumar, PA  clopidogrel (PLAVIX) 75 MG tablet Take 1 tablet (75 mg total) by mouth daily with breakfast. 12/26/19  Yes O'Neal, Cassie Freer, MD  doxycycline (VIBRAMYCIN) 50 MG capsule Take 2 capsules (100 mg total) by mouth 2 (two) times daily for 14 days. 01/03/20 01/17/20 Yes Bhagat, Bhavinkumar, PA  gabapentin (NEURONTIN) 300 MG capsule TAKE 1 CAPSULE(300 MG) BY MOUTH THREE TIMES DAILY Patient taking differently: Take 300 mg by mouth 2 (two) times daily. Take 300 mg by mouth in the morning and 300 mg at bedtime- may take an additional 300 mg once a day as needed/as directed 02/12/19  Yes [name], MD  hydrALAZINE (APRESOLINE) 25 MG tablet Take 1  tablet (25 mg total) by mouth every 8 (eight) hours. 01/03/20  Yes Bhagat, Crista Luria, PA  Lancets (FREESTYLE) lancets Use as instructed 09/18/14  Yes Delfina Redwood, MD  Lancets 30G MISC  04/06/16  Yes [provider]  Multiple Vitamins-Minerals (CENTRUM SILVER 50+MEN) TABS Take 1 tablet by mouth daily with breakfast.   Yes [provider]  ofloxacin (FLOXIN) 0.3 % OTIC solution Place 5 drops into the left ear daily. 01/07/20  Yes Horton, Barbette Hair, MD  Polyethyl Glycol-Propyl Glycol (SYSTANE OP) Place 1 drop into both eyes as needed (for dryness).    Yes [provider]  torsemide (DEMADEX) 20 MG tablet Take 1 tablet (20 mg total) by mouth daily. 01/03/20  Yes Bhagat, Bhavinkumar, PA  Wound Dressings (MEDIHONEY WOUND/BURN DRESSING) GEL Apply to affected are 3 times a week, and cover with sterile dressing. 08/21/19  Yes Stover, Titorya, DPM  insulin isophane & regular human (NOVOLIN 70/30 FLEXPEN RELION) (70-30) 100 UNIT/ML KwikPen Inject 10 Units into the skin 2 (two) times daily. Further refills by PCP Patient not taking: Reported on 01/09/2020 01/03/20   Leanor Kail, PA    Past Medical History:  Diagnosis Date  . Cellulitis and abscess of face 10/11/2007   Qualifier: Diagnosis of  By: Amil Amen MD, Benjamine Mola    . Diabetic retinopathy ([place])   . Diabetic retinopathy associated with type 2 diabetes mellitus ([place]) 09/13/2014   He works for [employer]  . Fluid overload 09/03/2012   Post op  . GERD (gastroesophageal reflux disease)   . Visual impairment     Past Surgical History:  Procedure Laterality Date  . ABDOMINAL AORTOGRAM W/LOWER EXTREMITY Left 05/30/2019   Procedure: ABDOMINAL AORTOGRAM W/LOWER EXTREMITY;  Surgeon: Marty Heck, MD;  Location: [facility];  Service: Cardiovascular;  Laterality: Left;  . CATARACT EXTRACTION W/ INTRAOCULAR LENS  IMPLANT, BILATERAL    . EYE SURGERY Bilateral    "laser OR for diabetic  retinopathy"  . INGUINAL HERNIA REPAIR     [name] 07/12/2010), "don't remember which side"  . LAPAROTOMY  08/23/2012   Procedure: EXPLORATORY LAPAROTOMY;  Surgeon: [name], DO;  Location: [facility];  Service: General;  Laterality: N/A;  exploratory laparotomy with lysis of adhesions  . LYSIS OF ADHESION  08/23/2012   Procedure: LYSIS OF ADHESION;  Surgeon: [name], DO;  Location: [facility];  Service: General;;  . PERIPHERAL VASCULAR BALLOON ANGIOPLASTY  05/30/2019   Procedure: PERIPHERAL VASCULAR BALLOON ANGIOPLASTY;  Surgeon: Marty Heck, MD;  Location: [facility];  Service: Cardiovascular;;  left anterior tibial  . ROBOT ASSISTED LAPAROSCOPIC RADICAL PROSTATECTOMY  2000's   "had to finish manually after machine broke"  . SHOULDER SURGERY  1970's   separation; from playing football"  Social History   Tobacco Use  . Smoking status: Former Smoker    Packs/day: 2.50    Years: 3.00    Pack years: 7.50    Types: Cigarettes    Quit date: 11/09/1966    Years since quitting: 53.2  . Smokeless tobacco: Never Used  Substance Use Topics  . Alcohol use: Yes    Comment: 04/08/2015 "maybe a beer/ or 2 or a glass of wine monthly"    Family History  Problem Relation Age of Onset  . Diabetes Mellitus II Mother   . Colon cancer Neg Hx     Review of Systems  Constitutional: Negative for chills and fever.  Respiratory: Negative for cough and shortness of breath.   Cardiovascular: Negative for chest pain, palpitations and leg swelling.  Gastrointestinal: Negative for abdominal pain, nausea and vomiting.  per hpi   OBJECTIVE:  Today's Vitals   01/09/20 1403  BP: (!) 195/75  Pulse: 71  Temp: 98.6 F (37 C)  SpO2: 98%  Weight: 171 lb (77.6 kg)  Height: _0  (1.676 m)   Body mass index is 27.6 kg/m.   Physical Exam Vitals and nursing note reviewed.  Constitutional:      Appearance: He is well-developed.  HENT:     Head: Normocephalic and atraumatic.      Right Ear: Hearing, tympanic membrane, ear canal and external ear normal.     Left Ear: Ear canal and external ear normal. Decreased hearing noted. Tympanic membrane is perforated.     Mouth/Throat:     Pharynx: No oropharyngeal exudate.  Eyes:     Conjunctiva/sclera: Conjunctivae normal.     Pupils: Pupils are equal, round, and reactive to light.  Cardiovascular:     Rate and Rhythm: Normal rate and regular rhythm.     Heart sounds: No murmur. No friction rub. No gallop.   Pulmonary:     Effort: Pulmonary effort is normal.     Breath sounds: Normal breath sounds. No wheezing or rales.  Musculoskeletal:     Cervical back: Neck supple.  Lymphadenopathy:     Cervical: No cervical adenopathy.  Skin:    General: Skin is warm and dry.  Neurological:     Mental Status: He is alert and oriented to person, place, and time.     Results for orders placed or performed in visit on 01/08/20 (from the past 24 hour(s))  Basic metabolic panel     Status: Abnormal   Collection Time: 01/08/20  3:34 PM  Result Value Ref Range   Glucose 415 (H) 65 - 99 mg/dL   BUN 40 (H) 8 - 27 mg/dL   Creatinine, Ser 2.54 (H) 0.76 - 1.27 mg/dL   GFR calc non Af Amer 25 (L) >59 mL/min/1.73   GFR calc Af Amer 28 (L) >59 mL/min/1.73   BUN/Creatinine Ratio 16 10 - 24   Sodium 138 134 - 144 mmol/L   Potassium 5.1 3.5 - 5.2 mmol/L   Chloride 98 96 - 106 mmol/L   CO2 26 20 - 29 mmol/L   Calcium 9.6 8.6 - 10.2 mg/dL   Narrative   Performed at:  [address] [place], [place]  [id number] Lab Director: Rush Farmer MD, Phone:  [phone]    No results found.   ASSESSMENT and PLAN  1. Uncontrolled type 2 diabetes mellitus with hyperglycemia ([place]) rx for syringes given. Again limited by finances (despite insurance) and CKD4, managed by endo  2. Essential hypertension  Not controlled. Restart hydralazine  3. Perforation of left tympanic membrane Has upcoming appt with ENT, cont ggt,  keep dry instructions reviewed  4. CRI (chronic renal insufficiency), stage 4 (severe) ([facility]) Declines referral to renal at this time, gabapentin renally dose. Discussed importance of DM and HTN control.   5. Psychophysiological insomnia rx for trazodone given  6. Insufficient social insurance and welfare support  Other orders - clopidogrel (PLAVIX) 75 MG tablet; Take 1 tablet (75 mg total) by mouth daily with breakfast. - gabapentin (NEURONTIN) 300 MG capsule; Take 1 capsule (300 mg total) by mouth 2 (two) times daily. TAKE 1 CAPSULE(300 MG) BY MOUTH THREE TIMES DAILY - hydrALAZINE (APRESOLINE) 25 MG tablet; Take 1 tablet (25 mg total) by mouth every 8 (eight) hours. - insulin NPH-regular Human (70-30) 100 UNIT/ML injection; Inject 10 Units into the skin 2 (two) times daily with a meal. - Insulin Syringe-Needle U-100 (INSULIN SYRINGE .5CC/31GX5/16") 31G X 5/16" 0.5 ML MISC; Use to inject insulin twice a day - traZODone (DESYREL) 50 MG tablet; Take 0.5-1 tablets (25-50 mg total) by mouth at bedtime as needed for sleep.  Return in about 3 weeks (around 01/30/2020).    [name], MD [facility] [place], [place] [address] Ph.  [phone] Fax [phone]

## 2020-01-10 ENCOUNTER — Other Ambulatory Visit: Payer: Self-pay | Admitting: Family Medicine

## 2020-01-10 ENCOUNTER — Encounter: Payer: Self-pay | Admitting: *Deleted

## 2020-01-10 MED ORDER — GABAPENTIN 300 MG PO CAPS: 300.0000 mg | ORAL_CAPSULE | Freq: Two times a day (BID) | ORAL | 5 refills | Status: DC

## 2020-01-10 NOTE — Progress Notes (Signed)
[name], [place] ([id number]) Visit Report for 01/09/2020 Arrival Information Details Patient Name: Date of Service: [name] [facility] [place] L. 01/09/2020 9:30 A M Medical Record Number: [id number] Patient Account Number: [id number] Date of Birth/Sex: Treating RN: 12/14/1950 (69 y.o. [name] Primary Care Kamden Stanislaw: [name] Other Clinician: Referring Acquanetta Cabanilla: Treating Glory Graefe/Extender: Mitchell Heir Weeks in Treatment: 11 Visit Information History Since Last Visit Pain Present Now: No Patient Arrived: Ambulatory Arrival Time: 09:37 Accompanied By: self Transfer Assistance: None Patient Identification Verified: Yes Secondary Verification Process Completed: Yes Patient Requires Transmission-Based Precautions: No Patient Has Alerts: Yes Patient Alerts: Patient on Blood Thinner Electronic Signature(s) Signed: 01/10/2020 4:59:31 PM By: Kela Millin Entered By: Kela Millin on 01/09/2020 09:38:10 -------------------------------------------------------------------------------- Encounter Discharge Information Details Patient Name: Date of Service: [name] ND L. 01/09/2020 9:30 A M Medical Record Number: [id number] Patient Account Number: [id number] Date of Birth/Sex: Treating RN: [birth year] (69 y.o. [name] Primary Care Mozell Haber: [name] Other Clinician: Referring Aiva Miskell: Treating Arshad Oberholzer/Extender: Mitchell Heir Weeks in Treatment: 11 Encounter Discharge Information Items Post Procedure Vitals Discharge Condition: Stable Temperature (F): 98.2 Ambulatory Status: Ambulatory Pulse (bpm): 72 Discharge Destination: Home Respiratory Rate (breaths/min): 18 Transportation: Private Auto Blood Pressure (mmHg): 180/64 Accompanied By: self Schedule Follow-up Appointment: Yes Clinical Summary of Care: Patient Declined Electronic Signature(s) Signed: 01/10/2020 4:59:31 PM By: Kela Millin Entered By:  Kela Millin on 01/09/2020 10:28:56 -------------------------------------------------------------------------------- Lower Extremity Assessment Details Patient Name: Date of Service: [name] [place] L. 01/09/2020 9:30 A M Medical Record Number: [id number] Patient Account Number: [id number] Date of Birth/Sex: Treating RN: 1951/04/13 (69 y.o. [name] Primary Care Clarabell Matsuoka: [name] Other Clinician: Referring Amerika Nourse: Treating Jennifr Gaeta/Extender: Mitchell Heir Weeks in Treatment: 11 Edema Assessment Assessed: [Left: No] [Right: No] Edema: [Left: N] [Right: o] Calf Left: Right: Point of Measurement: 41 cm From Medial Instep 40.5 cm cm Ankle Left: Right: Point of Measurement: 9 cm From Medial Instep 26 cm cm Vascular Assessment Pulses: Dorsalis Pedis Palpable: [Left:No] Electronic Signature(s) Signed: 01/10/2020 4:59:31 PM By: Kela Millin Entered By: Kela Millin on 01/09/2020 09:39:46 -------------------------------------------------------------------------------- [place] Details Patient Name: Date of Service: [name] [place] L. 01/09/2020 9:30 A M Medical Record Number: [id number] Patient Account Number: [id number] Date of Birth/Sex: Treating RN: April 24, 1951 (69 y.o. [name] Primary Care Linley Moskal: [name] Other Clinician: Referring Malic Rosten: Treating Sheriden Archibeque/Extender: Mitchell Heir Weeks in Treatment: 11 Active Inactive Pain, Acute or Chronic Nursing Diagnoses: Pain, acute or chronic: actual or potential Goals: Patient/caregiver will verbalize adequate pain control between visits Date Initiated: 01/09/2020 Target Resolution Date: 01/17/2020 Goal Status: Active Interventions: Provide education on pain management Provision of support: recognize patient pain, provide comfort and support as needed Treatment Activities: Administer pain control measures as ordered :  01/09/2020 Notes: Electronic Signature(s) Signed: 01/09/2020 5:42:52 PM By: Deon Pilling Entered By: Deon Pilling on 01/09/2020 10:02:47 -------------------------------------------------------------------------------- Pain Assessment Details Patient Name: Date of Service: [name] [facility] [place] L. 01/09/2020 9:30 A M Medical Record Number: [id number] Patient Account Number: [id number] Date of Birth/Sex: Treating RN: April 02, 1951 (69 y.o. [name] Primary Care Gokul Waybright: [name] Other Clinician: Referring Ramey Ketcherside: Treating Jcion Buddenhagen/Extender: Mitchell Heir Weeks in Treatment: 11 Active Problems Location of Pain Severity and Description of Pain Patient Has Paino No Site Locations Pain Management and Medication Current Pain Management: Electronic Signature(s) Signed: 01/10/2020 4:59:31 PM By: Kela Millin Entered By: Kela Millin on  01/09/2020 09:38:46 -------------------------------------------------------------------------------- Patient/Caregiver Education Details Patient Name: Date of Service: [name] ND L. 5/27/2021andnbsp9:30 A M Medical Record Number: [id number] Patient Account Number: [id number] Date of Birth/Gender: Treating RN: 08/20/1950 (69 y.o. [name] Primary Care Physician: [name] Other Clinician: Referring Physician: Treating Physician/Extender: Arther Dames in Treatment: 11 Education Assessment Education Provided To: Patient Education Topics Provided Pain: Handouts: A Guide to Pain Control Methods: Explain/Verbal Responses: Reinforcements needed Electronic Signature(s) Signed: 01/09/2020 5:42:52 PM By: Deon Pilling Entered By: Deon Pilling on 01/09/2020 10:03:02 -------------------------------------------------------------------------------- Wound Assessment Details Patient Name: Date of Service: [name] ND L. 01/09/2020 9:30 A M Medical Record Number:  [id number] Patient Account Number: [id number] Date of Birth/Sex: Treating RN: 08/06/1951 (69 y.o. [name] Primary Care Jaydon Soroka: [name] Other Clinician: Referring Jabar Krysiak: Treating Zamere Pasternak/Extender: Mitchell Heir Weeks in Treatment: 11 Wound Status Wound Number: 3 Primary Diabetic Wound/Ulcer of the Lower Extremity Etiology: Wound Location: Left, Plantar Foot Wound Open Wounding Event: Gradually Appeared Status: Date Acquired: 12/23/2019 Comorbid Congestive Heart Failure, Hypertension, Peripheral Venous Weeks Of Treatment: 0 History: Disease, Type II Diabetes Clustered Wound: No Photos Photo Uploaded By: Mikeal Hawthorne on 01/10/2020 08:33:35 Wound Measurements Length: (cm) 1.5 Width: (cm) 1.7 Depth: (cm) 0.1 Area: (cm) 2.003 Volume: (cm) 0.2 % Reduction in Area: % Reduction in Volume: Epithelialization: None Tunneling: No Undermining: No Wound Description Classification: Grade 2 Wound Margin: Distinct, outline attached Exudate Amount: Small Exudate Type: Serosanguineous Exudate Color: red, brown Foul Odor After Cleansing: No Slough/Fibrino No Wound Bed Granulation Amount: Large (67-100%) Exposed Structure Granulation Quality: Red Fascia Exposed: No Fat Layer (Subcutaneous Tissue) Exposed: Yes Tendon Exposed: No Muscle Exposed: No Joint Exposed: No Bone Exposed: No Assessment Notes periwound macerated Treatment Notes Wound #3 (Left, Plantar Foot) 1. Cleanse With Wound Cleanser 2. Periwound Care Skin Prep 3. Primary Dressing Applied Calcium Alginate Ag 4. Secondary Dressing Foam 5. Secured With Tape 7. Footwear/Offloading device applied T Contact Cast otal Electronic Signature(s) Signed: 01/10/2020 4:59:31 PM By: Kela Millin Entered By: Kela Millin on 01/09/2020 09:45:37 -------------------------------------------------------------------------------- Wound Assessment Details Patient Name: Date  of Service: [name] ND L. 01/09/2020 9:30 A M Medical Record Number: [id number] Patient Account Number: [id number] Date of Birth/Sex: Treating RN: 1950-11-10 (69 y.o. [name] Primary Care Raynelle Fujikawa: [name] Other Clinician: Referring Shirlene Andaya: Treating Addilyn Satterwhite/Extender: Mitchell Heir Weeks in Treatment: 11 Wound Status Wound Number: 4 Primary Diabetic Wound/Ulcer of the Lower Extremity Etiology: Wound Location: Left, Lateral Foot Wound Open Wounding Event: Gradually Appeared Status: Date Acquired: 12/23/2019 Comorbid Congestive Heart Failure, Hypertension, Peripheral Venous Weeks Of Treatment: 0 History: Disease, Type II Diabetes Clustered Wound: No Photos Photo Uploaded By: Mikeal Hawthorne on 01/10/2020 08:33:36 Wound Measurements Length: (cm) 0.4 Width: (cm) 0.5 Depth: (cm) 0.3 Area: (cm) 0.157 Volume: (cm) 0.047 % Reduction in Area: % Reduction in Volume: Epithelialization: None Tunneling: No Undermining: Yes Starting Position (o'clock): 12 Ending Position (o'clock): 12 Maximum Distance: (cm) 1.2 Wound Description Classification: Grade 2 Wound Margin: Well defined, not attached Exudate Amount: Medium Exudate Type: Serosanguineous Exudate Color: red, brown Foul Odor After Cleansing: No Slough/Fibrino Yes Wound Bed Granulation Amount: Medium (34-66%) Exposed Structure Granulation Quality: Pink Fascia Exposed: No Necrotic Amount: Medium (34-66%) Fat Layer (Subcutaneous Tissue) Exposed: Yes Necrotic Quality: Adherent Slough Tendon Exposed: No Muscle Exposed: No Joint Exposed: No Bone Exposed: No Treatment Notes Wound #4 (Left, Lateral Foot) 1. Cleanse With Wound Cleanser 2. Periwound Care Skin  Prep 3. Primary Dressing Applied Calcium Alginate Ag 4. Secondary Dressing Foam 5. Secured With Tape 7. Footwear/Offloading device applied T Contact Cast otal Electronic Signature(s) Signed: 01/10/2020 4:59:31 PM  By: Kela Millin Entered By: Kela Millin on 01/09/2020 09:47:49 -------------------------------------------------------------------------------- Vitals Details Patient Name: Date of Service: [name] ND L. 01/09/2020 9:30 A M Medical Record Number: [id number] Patient Account Number: [id number] Date of Birth/Sex: Treating RN: 20-Apr-1951 (69 y.o. [name] Primary Care Pamila Mendibles: [name] Other Clinician: Referring Nero Sawatzky: Treating Cresta Riden/Extender: Mitchell Heir Weeks in Treatment: 11 Vital Signs Time Taken: 09:38 Temperature (F): 98.2 Height (in): 67 Pulse (bpm): 72 Weight (lbs): 173 Respiratory Rate (breaths/min): 18 Body Mass Index (BMI): 27.1 Blood Pressure (mmHg): 180/64 Reference Range: 80 - 120 mg / dl Electronic Signature(s) Signed: 01/10/2020 4:59:31 PM By: Kela Millin Entered By: Kela Millin on 01/09/2020 09:38:40

## 2020-01-14 ENCOUNTER — Ambulatory Visit: Payer: Medicare Other | Admitting: Orthotics

## 2020-01-14 ENCOUNTER — Other Ambulatory Visit: Payer: Self-pay

## 2020-01-14 DIAGNOSIS — E11610 Type 2 diabetes mellitus with diabetic neuropathic arthropathy: Secondary | ICD-10-CM

## 2020-01-14 DIAGNOSIS — L84 Corns and callosities: Secondary | ICD-10-CM

## 2020-01-14 DIAGNOSIS — I739 Peripheral vascular disease, unspecified: Secondary | ICD-10-CM

## 2020-01-14 NOTE — Progress Notes (Signed)
Unable to cast for CROW walker due to having cast on from wound care center.

## 2020-01-16 ENCOUNTER — Encounter: Payer: Self-pay | Admitting: Family Medicine

## 2020-01-16 ENCOUNTER — Ambulatory Visit: Payer: Medicare Other

## 2020-01-16 ENCOUNTER — Ambulatory Visit (INDEPENDENT_AMBULATORY_CARE_PROVIDER_SITE_OTHER): Payer: Medicare Other | Admitting: Family Medicine

## 2020-01-16 ENCOUNTER — Encounter (HOSPITAL_BASED_OUTPATIENT_CLINIC_OR_DEPARTMENT_OTHER): Payer: Medicare Other | Attending: Internal Medicine | Admitting: Internal Medicine

## 2020-01-16 ENCOUNTER — Other Ambulatory Visit: Payer: Self-pay

## 2020-01-16 VITALS — BP 148/78 | HR 66 | Temp 98.4°F | Resp 14 | Ht 66.0 in | Wt 171.0 lb

## 2020-01-16 DIAGNOSIS — H6121 Impacted cerumen, right ear: Secondary | ICD-10-CM

## 2020-01-16 DIAGNOSIS — K219 Gastro-esophageal reflux disease without esophagitis: Secondary | ICD-10-CM | POA: Insufficient documentation

## 2020-01-16 DIAGNOSIS — E1151 Type 2 diabetes mellitus with diabetic peripheral angiopathy without gangrene: Secondary | ICD-10-CM | POA: Insufficient documentation

## 2020-01-16 DIAGNOSIS — I509 Heart failure, unspecified: Secondary | ICD-10-CM | POA: Insufficient documentation

## 2020-01-16 DIAGNOSIS — I130 Hypertensive heart and chronic kidney disease with heart failure and stage 1 through stage 4 chronic kidney disease, or unspecified chronic kidney disease: Secondary | ICD-10-CM | POA: Diagnosis not present

## 2020-01-16 DIAGNOSIS — E1165 Type 2 diabetes mellitus with hyperglycemia: Secondary | ICD-10-CM

## 2020-01-16 DIAGNOSIS — E1142 Type 2 diabetes mellitus with diabetic polyneuropathy: Secondary | ICD-10-CM | POA: Insufficient documentation

## 2020-01-16 DIAGNOSIS — E11621 Type 2 diabetes mellitus with foot ulcer: Secondary | ICD-10-CM | POA: Insufficient documentation

## 2020-01-16 DIAGNOSIS — N189 Chronic kidney disease, unspecified: Secondary | ICD-10-CM | POA: Insufficient documentation

## 2020-01-16 DIAGNOSIS — L97522 Non-pressure chronic ulcer of other part of left foot with fat layer exposed: Secondary | ICD-10-CM | POA: Insufficient documentation

## 2020-01-16 DIAGNOSIS — I10 Essential (primary) hypertension: Secondary | ICD-10-CM

## 2020-01-16 DIAGNOSIS — E1122 Type 2 diabetes mellitus with diabetic chronic kidney disease: Secondary | ICD-10-CM | POA: Diagnosis not present

## 2020-01-16 DIAGNOSIS — Z8546 Personal history of malignant neoplasm of prostate: Secondary | ICD-10-CM | POA: Diagnosis not present

## 2020-01-16 LAB — GLUCOSE, POCT (MANUAL RESULT ENTRY): POC Glucose: 329 mg/dl — AB (ref 70–99)

## 2020-01-16 MED ORDER — TRULICITY 0.75 MG/0.5ML ~~LOC~~ SOAJ: 0.7500 mg | SUBCUTANEOUS | 0 refills | Status: AC

## 2020-01-16 MED ORDER — BASAGLAR KWIKPEN 100 UNIT/ML ~~LOC~~ SOPN: 30.0000 [IU] | PEN_INJECTOR | Freq: Every day | SUBCUTANEOUS | 0 refills | Status: DC

## 2020-01-16 NOTE — Progress Notes (Signed)
6/3/20213:31 PM  [name] 26-Dec-1950, 69 y.o., male [id number]  Chief Complaint  Patient presents with  . Diabetes    pt notes he has been eating healthier, pt was able to find his meters but pt does not know how to use them does not know a recent sugar. pt feels well    HPI:   Patient is a 69 y.o. male with past medical history significant for HTN, dCHF, ML4YTKP complications,CKD4, HLP, PAD  who presents today for routine followup  Seen may 27th - restarted hydralazine  Patient is overall doing ok Paperwork for MAP needs to be completed Had visit with wound care and disappointed that wounds have worsened, dx with charcot foot, remains on boot He needs instruction on how to use insulin, vial, and meter He prefers pens He has not started hydralazine yet He has not started insulin 70/30 yet Requesting to have right earwax removed   Lab Results  Component Value Date   HGBA1C 12.8 (H) 12/31/2019   HGBA1C  11/20/2019     Comment:     >15   HGBA1C >15 11/20/2019   Lab Results  Component Value Date   MICROALBUR 6.46 (H) 08/19/2008   LDLCALC 74 05/06/2019   CREATININE 2.54 (H) 01/08/2020    Depression screen PHQ 2/9 06/21/2019 06/21/2019 06/03/2019  Decreased Interest 0 0 0  Down, Depressed, Hopeless 1 0 0  PHQ - 2 Score 1 0 0  Some recent data might be hidden    Fall Risk  06/21/2019 06/03/2019 05/17/2019 04/19/2019 02/07/2019  Falls in the past year? 0 0 0 0 0  Number falls in past yr: 0 0 0 0 0  Injury with Fall? 0 0 0 0 0  Follow up Falls evaluation completed - - - -     Allergies  Allergen Reactions  . Ace Inhibitors Itching and Cough  . Naproxen Anaphylaxis, Shortness Of Breath and Other (See Comments)    Throat swells. cannot breathe, and causes GI distress    Prior to Admission medications   Medication Sig Start Date End Date Taking? Authorizing Provider  amLODipine (NORVASC) 10 MG tablet Take 1 tablet (10 mg total) by mouth daily. 12/26/19    O'NealCassie Freer, MD  aspirin 81 MG chewable tablet Chew 1 tablet (81 mg total) by mouth every morning. 09/25/19   O'NealCassie Freer, MD  atorvastatin (LIPITOR) 40 MG tablet Take 1 tablet (40 mg total) by mouth every morning. 12/26/19   O'Neal, Cassie Freer, MD  blood glucose meter kit and supplies KIT Dispense based on patient and insurance preference. Use up to four times daily as directed. 09/10/19   Philemon Kingdom, MD  blood glucose meter kit and supplies Dispense based on patient and insurance preference. Use up to four times daily as directed. (FOR ICD-10 E10.9, E11.9). 01/03/20   Bhagat, Crista Luria, PA  Blood Glucose Monitoring Suppl (RELION CONFIRM GLUCOSE MONITOR) w/Device KIT Use to check blood sugar 3 times a day. 09/10/19   Philemon Kingdom, MD  cefdinir (OMNICEF) 300 MG capsule Take 1 capsule (300 mg total) by mouth daily for 14 days. 01/03/20 01/17/20  Leanor Kail, PA  clopidogrel (PLAVIX) 75 MG tablet Take 1 tablet (75 mg total) by mouth daily with breakfast. 01/09/20   [name], MD  doxycycline (VIBRAMYCIN) 50 MG capsule Take 2 capsules (100 mg total) by mouth 2 (two) times daily for 14 days. 01/03/20 01/17/20  Leanor Kail, PA  gabapentin (NEURONTIN) 300 MG capsule  Take 1 capsule (300 mg total) by mouth 2 (two) times daily. 01/10/20   [name], MD  hydrALAZINE (APRESOLINE) 25 MG tablet Take 1 tablet (25 mg total) by mouth every 8 (eight) hours. 01/09/20   [name], MD  insulin NPH-regular Human (70-30) 100 UNIT/ML injection Inject 10 Units into the skin 2 (two) times daily with a meal. 01/09/20   [name], MD  Insulin Syringe-Needle U-100 (INSULIN SYRINGE .5CC/31GX5/16") 31G X 5/16" 0.5 ML MISC Use to inject insulin twice a day 01/09/20   [name], MD  Lancets (FREESTYLE) lancets Use as instructed 09/18/14   Delfina Redwood, MD  Lancets 30G MISC  04/06/16   [provider]  Multiple Vitamins-Minerals (CENTRUM SILVER  50+MEN) TABS Take 1 tablet by mouth daily with breakfast.    [provider]  ofloxacin (FLOXIN) 0.3 % OTIC solution Place 5 drops into the left ear daily. 01/07/20   Horton, Barbette Hair, MD  Polyethyl Glycol-Propyl Glycol (SYSTANE OP) Place 1 drop into both eyes as needed (for dryness).     [provider]  torsemide (DEMADEX) 20 MG tablet Take 1 tablet (20 mg total) by mouth daily. 01/03/20   Leanor Kail, PA  traZODone (DESYREL) 50 MG tablet Take 0.5-1 tablets (25-50 mg total) by mouth at bedtime as needed for sleep. 01/09/20   [name], MD  Wound Dressings (MEDIHONEY WOUND/BURN DRESSING) GEL Apply to affected are 3 times a week, and cover with sterile dressing. 08/21/19   Landis Martins, DPM    Past Medical History:  Diagnosis Date  . Cellulitis and abscess of face 10/11/2007   Qualifier: Diagnosis of  By: Amil Amen MD, Benjamine Mola    . Diabetic retinopathy ([place])   . Diabetic retinopathy associated with type 2 diabetes mellitus ([place]) 09/13/2014   He works for [employer]  . Fluid overload 09/03/2012   Post op  . GERD (gastroesophageal reflux disease)   . Visual impairment     Past Surgical History:  Procedure Laterality Date  . ABDOMINAL AORTOGRAM W/LOWER EXTREMITY Left 05/30/2019   Procedure: ABDOMINAL AORTOGRAM W/LOWER EXTREMITY;  Surgeon: Marty Heck, MD;  Location: [facility];  Service: Cardiovascular;  Laterality: Left;  . CATARACT EXTRACTION W/ INTRAOCULAR LENS  IMPLANT, BILATERAL    . EYE SURGERY Bilateral    "laser OR for diabetic retinopathy"  . INGUINAL HERNIA REPAIR     [name] 07/12/2010), "don't remember which side"  . LAPAROTOMY  08/23/2012   Procedure: EXPLORATORY LAPAROTOMY;  Surgeon: [name], DO;  Location: [facility];  Service: General;  Laterality: N/A;  exploratory laparotomy with lysis of adhesions  . LYSIS OF ADHESION  08/23/2012   Procedure: LYSIS OF ADHESION;  Surgeon: [name], DO;  Location: [facility];   Service: General;;  . PERIPHERAL VASCULAR BALLOON ANGIOPLASTY  05/30/2019   Procedure: PERIPHERAL VASCULAR BALLOON ANGIOPLASTY;  Surgeon: Marty Heck, MD;  Location: [facility];  Service: Cardiovascular;;  left anterior tibial  . ROBOT ASSISTED LAPAROSCOPIC RADICAL PROSTATECTOMY  2000's   "had to finish manually after machine broke"  . SHOULDER SURGERY  1970's   separation; from playing football"    Social History   Tobacco Use  . Smoking status: Former Smoker    Packs/day: 2.50    Years: 3.00    Pack years: 7.50    Types: Cigarettes    Quit date: 11/09/1966    Years since quitting: 53.2  . Smokeless tobacco:  Never Used  Substance Use Topics  . Alcohol use: Yes    Comment: 04/08/2015 "maybe a beer/ or 2 or a glass of wine monthly"    Family History  Problem Relation Age of Onset  . Diabetes Mellitus II Mother   . Colon cancer Neg Hx     Review of Systems  Constitutional: Negative for chills and fever.  Respiratory: Negative for cough and shortness of breath.   Cardiovascular: Negative for chest pain, palpitations and leg swelling.  Gastrointestinal: Negative for abdominal pain, nausea and vomiting.     OBJECTIVE:  Today's Vitals   01/16/20 1603 01/16/20 1608  BP: (!) 213/83 (!) 148/78  Pulse: 66   Resp: 14   Temp: 98.4 F (36.9 C)   TempSrc: Temporal   SpO2: 99%   Weight: 171 lb (77.6 kg)   Height: _0  (1.676 m)    Body mass index is 27.6 kg/m.   Physical Exam Vitals and nursing note reviewed.  Constitutional:      Appearance: He is well-developed.  HENT:     Head: Normocephalic and atraumatic.     Right Ear: Tympanic membrane, ear canal and external ear normal. There is impacted cerumen.     Ears:     Comments: Know LEFT TM perforation, sees ENT on the 14th of June Eyes:     Conjunctiva/sclera: Conjunctivae normal.     Pupils: Pupils are equal, round, and reactive to light.  Cardiovascular:     Rate and Rhythm: Normal rate and  regular rhythm.     Heart sounds: No murmur. No friction rub. No gallop.   Pulmonary:     Effort: Pulmonary effort is normal.     Breath sounds: Normal breath sounds. No wheezing or rales.  Musculoskeletal:     Cervical back: Neck supple.  Skin:    General: Skin is warm and dry.  Neurological:     Mental Status: He is alert and oriented to person, place, and time.     Results for orders placed or performed in visit on 01/16/20 (from the past 24 hour(s))  POCT glucose (manual entry)     Status: Abnormal   Collection Time: 01/16/20  4:15 PM  Result Value Ref Range   POC Glucose 329 (A) 70 - 99 mg/dl    No results found.   ASSESSMENT and PLAN  1. Uncontrolled type 2 diabetes mellitus with hyperglycemia ([place]) LPN provided education on insulin and glucometer use. Start 70/30 10 units BID. In the meanwhile will request basaglar and trulicity thru [facility]. Discussed importance of glucose control.  - POCT glucose (manual entry)  2. Essential hypertension Improved. Start hydralazine  3. Impacted cerumen of right ear Lavage performed by CMA wo issue  Compliance with mediations affected by finances   Other orders - Insulin Glargine (BASAGLAR KWIKPEN) 100 UNIT/ML; Inject 0.3 mLs (30 Units total) into the skin at bedtime. - Dulaglutide (TRULICITY) 7.82 NF/6.2ZH SOPN; Inject 0.5 mLs (0.75 mg total) into the skin once a week.  Return in about 2 weeks (around 01/30/2020).    [name], MD [facility] [place], [place] [address] Ph.  [phone] Fax [phone]

## 2020-01-16 NOTE — Patient Instructions (Addendum)
  Start insulin 70/30 10 units twice a day 30 minutes before breakfast and dinner Check blood glucose before use of insulin. Bring your glucometer to your appointments  We will be submitting paperwork to see if we can get basaglar and trulicity covered by the pharmaceutical.      If you have lab work done today you will be contacted with your lab results within the next 2 weeks.  If you have not heard from [place] then please contact us. The fastest way to get your results is to register for My Chart.   IF you received an x-ray today, you will receive an invoice from [facility]. Please contact [facility] at [phone] with questions or concerns regarding your invoice.   IF you received labwork today, you will receive an invoice from [place]. Please contact [facility] at [phone] with questions or concerns regarding your invoice.   Our billing staff will not be able to assist you with questions regarding bills from these companies.  You will be contacted with the lab results as soon as they are available. The fastest way to get your results is to activate your My Chart account. Instructions are located on the last page of this paperwork. If you have not heard from [place] regarding the results in 2 weeks, please contact this office.

## 2020-01-17 NOTE — Progress Notes (Signed)
Abel, Swink ([id number]) Visit Report for 01/16/2020 HPI Details Patient Name: Date of Service: [name] [place] L. 01/16/2020 8:45 A M Medical Record Number: [id number] Patient Account Number: [id number] Date of Birth/Sex: Treating RN: 09/23/1950 (69 y.o. [name] Primary Care Provider: [name] Other Clinician: Referring Provider: Treating Provider/Extender: [name]: 12 History of Present Illness HPI Description: ADMISSION 10/18/2019 Patient is a 69 year old man with type 2 diabetes peripheral neuropathy and a Charcot foot. The problem I think began in March 2020 just about a year ago when he was admitted to hospital from 10/19/2018 through 10/24/2018 with a wound on his left lateral foot intense cellulitis. MRI did not suggest osteomyelitis but did note a draining sinus. He apparently received a prolonged course of antibiotics. His hemoglobin A1c at that time was 13.9. He was referred to Dr. [name] where he appears to been followed for probably 5 or 6 months. He was subsequently referred himself to Dr. [name] of podiatry. He has been offloaded in a surgical shoe with a Pegasys sole. He has been mostly using Medihoney and episodic debridement of intense surrounding callus. He has been referred here for further management of this difficult the heel wound area and consideration of hyperbaric oxygen The patient had an angiogram in November. He did not have any proximal stenoses but he did have a 70 to 80% peroneal artery occlusion as well as an 80 to 95% stenosis of the anterior tibia which underwent an angioplasty. Follow-up ABIs were done on 07/02/2019 showed an ABI on the right of 1.26 on the left at 1.28 with TBI's of 0.65 and 0.67 respectively. Waveforms were triphasic Past medical history includes type 2 diabetes with neuropathy, PAD and retinopathy, prostate cancer, gastroesophageal reflux disease, chronic kidney disease, small bowel  obstruction 10/25/2019. Patient's x-ray of the left foot from last time showed no evidence of osteomyelitis. Chronic Charcot/neuropathic changes as expected. Notable soft tissue ulcer involving the plantar aspect underlying the forefoot. The patient actually has 2 wounds. One on the mid forefoot limited to breakdown of skin. The larger area is actually on the lateral foot. We have been using silver alginate to both wound areas. 3/15; patient came back for the obligatory first total contact cast change. He was apparently walking on the cast without the boot but otherwise tolerated it well and per our intake nurse the wounds look satisfactory. He will be back later in the week for his wound care visit 3/18; patient's wound on the plantar foot looks smaller of lateral foot about the same. Both required debridement. There was some macerated tissue between the 2 wounds which were also removed. We have been using silver alginate. 3/26 1 week follow-up. T contact cast. Plantar foot wound has closed over. The area on the lateral part of his foot is still open but smaller. Using silver otal alginate on the wounds 11/15/2019 upon evaluation today patient appears to be doing well with regard to his wound. He is making progress and fortunately the wound is measuring smaller and looking better at this point. Fortunately there is no signs of active infection which is also good news we been using a total contact cast up to this point. 4/9; small wound now on the left lateral foot not technically on the plantar surface although I think he may be somebody who walks partially on the outside of his foot. The more extensive wound on the plantar foot healed 2 weeks ago. The patient  tells me that his mother is recently passed away. He wants the total contact cast off Week in preparation for a funeral in [place]. We'll bring him back in on Wednesday to try and accommodate this. We also changed the [facility]  might give [place] a quick chance to have a look at this on that date 4/13; patient is here for a total contact cast change. The wound looks quite healthy we reapplied Hydrofera Blue. He will be back next week for review 4/20; the patient's wound is slightly smaller. The area on the plantar foot remains healed. We have been using Hydrofera Blue 4/27 patient's wound is about half the size. Using Hydrofera Blue the plantar foot remains healed. Blood pressure very high today 5/4; the patient's wound is totally healed. Type 2 diabetes with a Charcot foot. Healed out in a total contact cast. He has a diabetic shoe with custom inserts that he got at [facility]. 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 -------------------------------------------------------------------------------------- [place] 01/09/20 -Patient's wound has opened up again on his left plantar foot with extension of hematoma to the left lateral edge. Patient denies any fevers or chills. It appears that he has been wearing regular shoes although he states most of the time he is using his custom fitted boot that he has had for 7 months 6/3; I am disturbed to see that the patient had to be readmitted to the clinic last week 3 weeks after we had healed him out with a total contact cast. He had diabetic shoes with custom inserts although he tells me he was only wearing them half the time. He has been to see Liliane Channel who is the foot where orthotist [facility] and he is recommending a bubble pocket offload which I am really fine with but I emphasized to the patient that he needs to wear these ALLTHE TIME. Suggested Bubble pocket off load [id number] Electronic Signature(s) Signed: 01/17/2020 8:05:25 AM By: Linton Ham MD Entered By: Linton Ham on 01/16/2020  09:43:01 -------------------------------------------------------------------------------- Physical Exam Details Patient Name: Date of Service: [name] [place] ND L. 01/16/2020 8:45 A M Medical Record Number: [id number] Patient Account Number: [id number] Date of Birth/Sex: Treating RN: 03-06-1951 (69 y.o. [name] Primary Care Provider: [name] Other Clinician: Referring Provider: Treating Provider/Extender: [name]: 12 Constitutional Patient is hypertensive.. Pulse regular and within target range for patient.[name] Respirations regular, non-labored and within target range.. Temperature is normal and within the target range for the patient.[name] Appears in no distress. Notes Left foot mid aspect same place as last time. Clean superficial wound no evidence of surrounding infection. The area on the left lateral foot appears to be epithelialized over already. His peripheral pulses are palpable he has a Charcot foot deformity Electronic Signature(s) Signed: 01/17/2020 8:05:25 AM By: Linton Ham MD Entered By: Linton Ham on 01/16/2020 09:39:17 -------------------------------------------------------------------------------- Physician Orders Details Patient Name: Date of Service: [name] [place] ND L. 01/16/2020 8:45 A M Medical Record Number: [id number] Patient Account Number: [id number] Date of Birth/Sex: Treating RN: [birth year] (69 y.o. [name] Primary Care Provider: [name] Other Clinician: Referring Provider: Treating Provider/Extender: [name]: 12 Verbal / Phone Orders: No Diagnosis Coding ICD-10 Coding Code  Description [phone] Non-pressure chronic ulcer of other part of left foot with fat layer exposed E11.51 Type 2 diabetes mellitus with diabetic peripheral angiopathy without gangrene E11.621 Type 2 diabetes mellitus with foot ulcer L97.521 Non-pressure chronic ulcer of  other part of left foot limited to breakdown of skin M14.672 Charcot's joint, left ankle and foot E11.40 Type 2 diabetes mellitus with diabetic neuropathy, unspecified Follow-up Appointments ppointment in 1 week. - T Contact Cast otal Return A Dressing Change Frequency Do not change entire dressing for one week. Wound Cleansing May shower with protection. Primary Wound Dressing Wound #3 Left,Plantar Foot Calcium Alginate with Silver Secondary Dressing Foam - foam donut Dry Gauze Other: - please pad the left lateral foot for protection. Edema Control Avoid standing for long periods of time Elevate legs to the level of the heart or above for 30 minutes daily and/or when sitting, a frequency of: - throughout the day. Off-Loading Total Contact Cast to Left Lower Extremity Electronic Signature(s) Signed: 01/16/2020 6:01:01 PM By: Deon Pilling Signed: 01/17/2020 8:05:25 AM By: Linton Ham MD Entered By: Deon Pilling on 01/16/2020 09:33:57 -------------------------------------------------------------------------------- Problem List Details Patient Name: Date of Service: [name] [place] ND L. 01/16/2020 8:45 A M Medical Record Number: [id number] Patient Account Number: [id number] Date of Birth/Sex: Treating RN: Jun 23, 1951 (69 y.o. [name] Primary Care Provider: [name] Other Clinician: Referring Provider: Treating Provider/Extender: [name]: 12 Active Problems ICD-10 Encounter Code Description Active Date MDM Diagnosis [phone] Non-pressure chronic ulcer of other part of left foot with fat layer exposed 10/18/2019 No Yes E11.51 Type 2 diabetes mellitus with diabetic peripheral angiopathy without gangrene 10/18/2019 No Yes E11.621 Type 2 diabetes mellitus with foot ulcer 10/18/2019 No Yes L97.521 Non-pressure chronic ulcer of other part of left foot limited to breakdown of 10/25/2019 No Yes skin M14.672 Charcot's joint, left  ankle and foot 10/18/2019 No Yes E11.40 Type 2 diabetes mellitus with diabetic neuropathy, unspecified 10/18/2019 No Yes Inactive Problems Resolved Problems Electronic Signature(s) Signed: 01/17/2020 8:05:25 AM By: Linton Ham MD Entered By: Linton Ham on 01/16/2020 09:36:15 -------------------------------------------------------------------------------- Progress Note Details Patient Name: Date of Service: [name] [place] ND L. 01/16/2020 8:45 A M Medical Record Number: [id number] Patient Account Number: [id number] Date of Birth/Sex: Treating RN: 1950/08/28 (69 y.o. [name] Primary Care Provider: [name] Other Clinician: Referring Provider: Treating Provider/Extender: [name]: 12 Subjective History of Present Illness (HPI) ADMISSION 10/18/2019 Patient is a 69 year old man with type 2 diabetes peripheral neuropathy and a Charcot foot. The problem I think began in March 2020 just about a year ago when he was admitted to hospital from 10/19/2018 through 10/24/2018 with a wound on his left lateral foot intense cellulitis. MRI did not suggest osteomyelitis but did note a draining sinus. He apparently received a prolonged course of antibiotics. His hemoglobin A1c at that time was 13.9. He was referred to Dr. [name] where he appears to been followed for probably 5 or 6 months. He was subsequently referred himself to Dr. [name] of podiatry. He has been offloaded in a surgical shoe with a Pegasys sole. He has been mostly using Medihoney and episodic debridement of intense surrounding callus. He has been referred here for further management of this difficult the heel wound area and consideration of hyperbaric oxygen The patient had an angiogram in November. He did not have any proximal stenoses but he did have a 70 to 80% peroneal artery occlusion as  well as an 80 to 95% stenosis of the anterior tibia which underwent an angioplasty. Follow-up ABIs  were done on 07/02/2019 showed an ABI on the right of 1.26 on the left at 1.28 with TBI's of 0.65 and 0.67 respectively. Waveforms were triphasic Past medical history includes type 2 diabetes with neuropathy, PAD and retinopathy, prostate cancer, gastroesophageal reflux disease, chronic kidney disease, small bowel obstruction 10/25/2019. Patient's x-ray of the left foot from last time showed no evidence of osteomyelitis. Chronic Charcot/neuropathic changes as expected. Notable soft tissue ulcer involving the plantar aspect underlying the forefoot. The patient actually has 2 wounds. One on the mid forefoot limited to breakdown of skin. The larger area is actually on the lateral foot. We have been using silver alginate to both wound areas. 3/15; patient came back for the obligatory first total contact cast change. He was apparently walking on the cast without the boot but otherwise tolerated it well and per our intake nurse the wounds look satisfactory. He will be back later in the week for his wound care visit 3/18; patient's wound on the plantar foot looks smaller of lateral foot about the same. Both required debridement. There was some macerated tissue between the 2 wounds which were also removed. We have been using silver alginate. 3/26 1 week follow-up. T contact cast. Plantar foot wound has closed over. The area on the lateral part of his foot is still open but smaller. Using silver otal alginate on the wounds 11/15/2019 upon evaluation today patient appears to be doing well with regard to his wound. He is making progress and fortunately the wound is measuring smaller and looking better at this point. Fortunately there is no signs of active infection which is also good news we been using a total contact cast up to this point. 4/9; small wound now on the left lateral foot not technically on the plantar surface although I think he may be somebody who walks partially on the outside of his foot. The  more extensive wound on the plantar foot healed 2 weeks ago. The patient tells me that his mother is recently passed away. He wants the total contact cast off Week in preparation for a funeral in [place]. We'll bring him back in on Wednesday to try and accommodate this. We also changed the [facility] might give [place] a quick chance to have a look at this on that date 4/13; patient is here for a total contact cast change. The wound looks quite healthy we reapplied Hydrofera Blue. He will be back next week for review 4/20; the patient's wound is slightly smaller. The area on the plantar foot remains healed. We have been using Hydrofera Blue 4/27 patient's wound is about half the size. Using Hydrofera Blue the plantar foot remains healed. Blood pressure very high today 5/4; the patient's wound is totally healed. Type 2 diabetes with a Charcot foot. Healed out in a total contact cast. He has a diabetic shoe with custom inserts that he got at [facility]. 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 -------------------------------------------------------------------------------------- [place] 01/09/20 -Patient's wound has opened up again on his left plantar foot with extension of hematoma to the left lateral edge. Patient denies any fevers or chills. It appears that he has been wearing regular shoes although  he states most of the time he is using his custom fitted boot that he has had for 7 months 6/3; I am disturbed to see that the patient had to be readmitted to the clinic last week 3 weeks after we had healed him out with a total contact cast. He had diabetic shoes with custom inserts although he tells me he was only wearing them half the time. He has been to see Liliane Channel who is the foot where orthotist [facility] and he is  recommending a bubble pocket offload which I am really fine with but I emphasized to the patient that he needs to wear these ALLTHE TIME Objective Constitutional Patient is hypertensive.. Pulse regular and within target range for patient.[name] Respirations regular, non-labored and within target range.. Temperature is normal and within the target range for the patient.[name] Appears in no distress. Vitals Time Taken: 8:46 AM, Height: 67 in, Weight: 173 lbs, BMI: 27.1, Temperature: 98.3 F, Pulse: 66 bpm, Respiratory Rate: 18 breaths/min, Blood Pressure: 189/65 mmHg. General Notes: Left foot mid aspect same place as last time. Clean superficial wound no evidence of surrounding infection. The area on the left lateral foot appears to be epithelialized over already. His peripheral pulses are palpable he has a Charcot foot deformity Integumentary (Hair, Skin) Wound #3 status is Open. Original cause of wound was Gradually Appeared. The wound is located on the [place]. The wound measures 1.2cm length x 1.2cm width x 0.1cm depth; 1.131cm^2 area and 0.113cm^3 volume. There is Fat Layer (Subcutaneous Tissue) Exposed exposed. There is no tunneling or undermining noted. There is a medium amount of serosanguineous drainage noted. The wound margin is distinct with the outline attached to the wound base. There is large (67-100%) red granulation within the wound bed. Wound #4 status is Healed - Epithelialized. Original cause of wound was Gradually Appeared. The wound is located on the Left,Lateral Foot. The wound measures 0cm length x 0cm width x 0cm depth; 0cm^2 area and 0cm^3 volume. There is no tunneling or undermining noted. There is a none present amount of drainage noted. The wound margin is well defined and not attached to the wound base. There is no granulation within the wound bed. There is no necrotic tissue within the wound bed. Assessment Active Problems ICD-10 Non-pressure chronic ulcer of other  part of left foot with fat layer exposed Type 2 diabetes mellitus with diabetic peripheral angiopathy without gangrene Type 2 diabetes mellitus with foot ulcer Non-pressure chronic ulcer of other part of left foot limited to breakdown of skin Charcot's joint, left ankle and foot Type 2 diabetes mellitus with diabetic neuropathy, unspecified Procedures Wound #3 Pre-procedure diagnosis of Wound #3 is a Diabetic Wound/Ulcer of the Lower Extremity located on the Left,Plantar Foot . There was a T Contact Cast otal Procedure by Ricard Dillon., MD. Post procedure Diagnosis Wound #3: Same as Pre-Procedure Plan Follow-up Appointments: Return Appointment in 1 week. - T Contact Cast otal Dressing Change Frequency: Do not change entire dressing for one week. Wound Cleansing: May shower with protection. Primary Wound Dressing: Wound #3 Left,Plantar Foot: Calcium Alginate with Silver Secondary Dressing: Foam - foam donut Dry Gauze Other: - please pad the left lateral foot for protection. Edema Control: Avoid standing for long periods of time Elevate legs to the level of the heart or above for 30 minutes daily and/or when sitting, a frequency of: - throughout the day. Off-Loading: T Contact Cast to Left Lower Extremity otal 1. Continue with  silver alginate under the total contact cast 2. We will communicate with Liliane Channel at [facility]. 3. I emphasized to the patient that whenever we come up with for a shoe with an insole needs to be used continuously not episodically or 50% of the time Electronic Signature(s) Signed: 01/17/2020 8:05:25 AM By: Linton Ham MD Entered By: Linton Ham on 01/16/2020 09:40:05 -------------------------------------------------------------------------------- Total Contact Cast Details Patient Name: Date of Service: [name] [place] ND L. 01/16/2020 8:45 A M Medical Record Number: [id number] Patient Account Number: [id number] Date of  Birth/Sex: Treating RN: 09/14/1950 (69 y.o. [name] Primary Care Provider: [name] Other Clinician: Referring Provider: Treating Provider/Extender: [name]: 12 T Contact Cast Applied for Wound Assessment: otal Wound #3 Left,Plantar Foot Performed By: Physician Ricard Dillon., MD Post Procedure Diagnosis Same as Pre-procedure Electronic Signature(s) Signed: 01/17/2020 8:05:25 AM By: Linton Ham MD Entered By: Linton Ham on 01/16/2020 09:36:37 -------------------------------------------------------------------------------- SuperBill Details Patient Name: Date of Service: [name] [place] ND L. 01/16/2020 Medical Record Number: [id number] Patient Account Number: [id number] Date of Birth/Sex: Treating RN: Feb 26, 1951 (69 y.o. [name] Primary Care Provider: [name] Other Clinician: Referring Provider: Treating Provider/Extender: [name]: 12 Diagnosis Coding ICD-10 Codes Code Description [phone] Non-pressure chronic ulcer of other part of left foot with fat layer exposed E11.51 Type 2 diabetes mellitus with diabetic peripheral angiopathy without gangrene E11.621 Type 2 diabetes mellitus with foot ulcer L97.521 Non-pressure chronic ulcer of other part of left foot limited to breakdown of skin M14.672 Charcot's joint, left ankle and foot E11.40 Type 2 diabetes mellitus with diabetic neuropathy, unspecified Facility Procedures CPT4 Code: [id number] 2 Description: 9445 - APPLY TOTAL CONTACT LEG CAST ICD-10 Diagnosis Description L97.522 Non-pressure chronic ulcer of other part of left foot with fat layer exposed Modifier: Quantity: 1 Physician Procedures : CPT4 Code Description Modifier [id number] - WC PHYS APPLY TOTAL CONTACT CAST ICD-10 Diagnosis Description L97.522 Non-pressure chronic ulcer of other part of left foot with fat layer exposed Quantity:  1 Electronic Signature(s) Signed: 01/16/2020 6:01:01 PM By: Deon Pilling Signed: 01/17/2020 8:05:25 AM By: Linton Ham MD Entered By: Deon Pilling on 01/16/2020 09:34:35

## 2020-01-20 ENCOUNTER — Telehealth: Payer: Self-pay

## 2020-01-20 NOTE — Progress Notes (Signed)
[place], [place] ([id number]) Visit Report for 01/16/2020 Arrival Information Details Patient Name: Date of Service: Andy Gauss [facility] ND L. 01/16/2020 8:45 A M Medical Record Number: [id number] Patient Account Number: [id number] Date of Birth/Sex: Treating RN: Aug 22, 1950 (69 y.o. [name] Primary Care Aneesa Romey: [name] Other Clinician: Referring Dawaun Brancato: Treating Tierany Appleby/Extender: [name]: 12 Visit Information History Since Last Visit Added or deleted any medications: No Patient Arrived: Ambulatory Any new allergies or adverse reactions: No Arrival Time: 08:44 Had a fall or experienced change in No Accompanied By: self activities of daily living that may affect Transfer Assistance: None risk of falls: Patient Identification Verified: Yes Signs or symptoms of abuse/neglect since last visito No Secondary Verification Process Completed: Yes Hospitalized since last visit: No Patient Requires Transmission-Based Precautions: No Implantable device outside of the clinic excluding No Patient Has Alerts: Yes cellular tissue based products placed in the center Patient Alerts: Patient on Blood Thinner since last visit: Has Dressing in Place as Prescribed: Yes Pain Present Now: Yes Electronic Signature(s) Signed: 01/16/2020 9:12:52 AM By: Sandre Kitty Entered By: Sandre Kitty on 01/16/2020 08:46:23 -------------------------------------------------------------------------------- Lower Extremity Assessment Details Patient Name: Date of Service: [name] L. 01/16/2020 8:45 A M Medical Record Number: [id number] Patient Account Number: [id number] Date of Birth/Sex: Treating RN: [birth year] (69 y.o. [name] Primary Care Danee Soller: [name] Other Clinician: Referring Basem Yannuzzi: Treating Teran Knittle/Extender: [name]: 12 Edema Assessment Assessed: Shirlyn Goltz: No] Patrice Paradise:  No] Edema: [Left: N] [Right: o] Calf Left: Right: Point of Measurement: 41 cm From Medial Instep 36 cm cm Ankle Left: Right: Point of Measurement: 9 cm From Medial Instep 20 cm cm Vascular Assessment Pulses: Dorsalis Pedis Palpable: [Left:Yes] Electronic Signature(s) Signed: 01/20/2020 6:28:13 PM By: Levan Hurst RN, BSN Entered By: Levan Hurst on 01/16/2020 09:13:17 -------------------------------------------------------------------------------- Multi Wound Chart Details Patient Name: Date of Service: [name] ND L. 01/16/2020 8:45 A M Medical Record Number: [id number] Patient Account Number: [id number] Date of Birth/Sex: Treating RN: 03-05-1951 (69 y.o. [name] Primary Care Jaison Petraglia: [name] Other Clinician: Referring Jalynn Waddell: Treating Ileanna Gemmill/Extender: Aundra Dubin, Clelia Schaumann in Treatment: 12 Vital Signs Height(in): 67 Pulse(bpm): 67 Weight(lbs): 173 Blood Pressure(mmHg): 189/65 Body Mass Index(BMI): 27 Temperature(F): 98.3 Respiratory Rate(breaths/min): 18 Photos: [3:No Photos Left, Plantar Foot] [4:No Photos Left, Lateral Foot] [N/A:N/A N/A] Wound Location: [3:Gradually Appeared] [4:Gradually Appeared] [N/A:N/A] Wounding Event: [3:Diabetic Wound/Ulcer of the Lower] [4:Diabetic Wound/Ulcer of the Lower] [N/A:N/A] Primary Etiology: [3:Extremity Congestive Heart Failure,] [4:Extremity Congestive Heart Failure,] [N/A:N/A] Comorbid History: [3:Hypertension, Peripheral Venous Disease, Type II Diabetes 12/23/2019] [4:Hypertension, Peripheral Venous Disease, Type II Diabetes 12/23/2019] [N/A:N/A] Date Acquired: [3:1] [4:1] [N/A:N/A] Weeks of Treatment: [3:Open] [4:Healed - Epithelialized] [N/A:N/A] Wound Status: [3:1.2x1.2x0.1] [4:0x0x0] [N/A:N/A] Measurements L x W x D (cm) [3:1.131] [4:0] [N/A:N/A] A (cm) : rea [3:0.113] [4:0] [N/A:N/A] Volume (cm) : [3:43.50%] [4:100.00%] [N/A:N/A] % Reduction in A rea: [3:43.50%] [4:100.00%]  [N/A:N/A] % Reduction in Volume: [3:Grade 2] [4:Grade 2] [N/A:N/A] Classification: [3:Medium] [4:None Present] [N/A:N/A] Exudate A mount: [3:Serosanguineous] [4:N/A] [N/A:N/A] Exudate Type: [3:red, brown] [4:N/A] [N/A:N/A] Exudate Color: [3:Distinct, outline attached] [4:Well defined, not attached] [N/A:N/A] Wound Margin: [3:Large (67-100%)] [4:None Present (0%)] [N/A:N/A] Granulation A mount: [3:Red] [4:N/A] [N/A:N/A] Granulation Quality: [3:N/A] [4:None Present (0%)] [N/A:N/A] Necrotic A mount: [3:Fat Layer (Subcutaneous Tissue)] [4:Fascia: No] [N/A:N/A] Exposed Structures: [3:Exposed: Yes Fascia: No Tendon: No Muscle: No Joint: No Bone: No Small (1-33%)] [4:Fat Layer (Subcutaneous Tissue) Exposed:  No Tendon: No Muscle: No Joint: No Bone: No Large (67-100%)] [N/A:N/A] Epithelialization: [3:T Contact Cast otal] [4:N/A] [N/A:N/A] Treatment Notes Electronic Signature(s) Signed: 01/16/2020 6:01:01 PM By: Deon Pilling Signed: 01/17/2020 8:05:25 AM By: Linton Ham MD Entered By: Linton Ham on 01/16/2020 09:36:26 -------------------------------------------------------------------------------- Multi-Disciplinary Care Plan Details Patient Name: Date of Service: [name] [place] L. 01/16/2020 8:45 A M Medical Record Number: [id number] Patient Account Number: [id number] Date of Birth/Sex: Treating RN: [birth year] (69 y.o. [name] Primary Care Loletha Bertini: [name] Other Clinician: Referring Keatyn Luck: Treating Nathaneil Feagans/Extender: [name]: 12 Active Inactive Pain, Acute or Chronic Nursing Diagnoses: Pain, acute or chronic: actual or potential Goals: Patient/caregiver will verbalize adequate pain control between visits Date Initiated: 01/09/2020 Target Resolution Date: 03/13/2020 Goal Status: Active Interventions: Provide education on pain management Provision of support: recognize patient pain, provide comfort and support as  needed Treatment Activities: Administer pain control measures as ordered : 01/09/2020 Notes: Electronic Signature(s) Signed: 01/16/2020 6:01:01 PM By: Deon Pilling Entered By: Deon Pilling on 01/16/2020 09:23:35 -------------------------------------------------------------------------------- Pain Assessment Details Patient Name: Date of Service: [name] ND L. 01/16/2020 8:45 A M Medical Record Number: [id number] Patient Account Number: [id number] Date of Birth/Sex: Treating RN: 1951-02-10 (69 y.o. [name] Primary Care Yobani Schertzer: [name] Other Clinician: Referring Breylen Agyeman: Treating Ariella Voit/Extender: [name]: 12 Active Problems Location of Pain Severity and Description of Pain Patient Has Paino Yes Site Locations Rate the pain. Rate the pain. Current Pain Level: 8 Pain Management and Medication Current Pain Management: Electronic Signature(s) Signed: 01/16/2020 9:12:52 AM By: Sandre Kitty Signed: 01/16/2020 6:01:01 PM By: Deon Pilling Entered By: Sandre Kitty on 01/16/2020 08:50:19 -------------------------------------------------------------------------------- Patient/Caregiver Education Details Patient Name: Date of Service: [name] ND L. 6/3/2021andnbsp8:45 A M Medical Record Number: [id number] Patient Account Number: [id number] Date of Birth/Gender: Treating RN: 1950-10-16 (69 y.o. [name] Primary Care Physician: [name] Other Clinician: Referring Physician: Treating Physician/Extender: [name]: 12 Education Assessment Education Provided To: Patient Education Topics Provided Pain: Handouts: A Guide to Pain Control Methods: Explain/Verbal Responses: Reinforcements needed Electronic Signature(s) Signed: 01/16/2020 6:01:01 PM By: Deon Pilling Entered By: Deon Pilling on 01/16/2020  09:24:08 -------------------------------------------------------------------------------- Wound Assessment Details Patient Name: Date of Service: [name] ND L. 01/16/2020 8:45 A M Medical Record Number: [id number] Patient Account Number: [id number] Date of Birth/Sex: Treating RN: 04/01/1951 (69 y.o. [name] Primary Care Padme Arriaga: [name] Other Clinician: Referring Mitchel Delduca: Treating Carlita Whitcomb/Extender: [name]: 12 Wound Status Wound Number: 3 Primary Diabetic Wound/Ulcer of the Lower Extremity Etiology: Wound Location: Left, Plantar Foot Wound Open Wounding Event: Gradually Appeared Status: Date Acquired: 12/23/2019 Comorbid Congestive Heart Failure, Hypertension, Peripheral Venous Weeks Of Treatment: 1 History: Disease, Type II Diabetes Clustered Wound: No Photos Photo Uploaded By: Mikeal Hawthorne on 01/17/2020 08:44:00 Wound Measurements Length: (cm) 1.2 Width: (cm) 1.2 Depth: (cm) 0.1 Area: (cm) 1.131 Volume: (cm) 0.113 % Reduction in Area: 43.5% % Reduction in Volume: 43.5% Epithelialization: Small (1-33%) Tunneling: No Undermining: No Wound Description Classification: Grade 2 Wound Margin: Distinct, outline attached Exudate Amount: Medium Exudate Type: Serosanguineous Exudate Color: red, brown Foul Odor After Cleansing: No Slough/Fibrino No Wound Bed Granulation Amount: Large (67-100%) Exposed Structure Granulation Quality: Red Fascia Exposed: No Fat Layer (Subcutaneous Tissue) Exposed: Yes Tendon Exposed: No Muscle Exposed: No Joint Exposed: No Bone Exposed: No Electronic Signature(s) Signed: 01/16/2020  6:01:01 PM By: Deon Pilling Signed: 01/20/2020 6:28:13 PM By: Levan Hurst RN, BSN Previous Signature: 01/16/2020 9:12:52 AM Version By: Sandre Kitty Entered By: Levan Hurst on 01/16/2020 09:13:36 -------------------------------------------------------------------------------- Wound  Assessment Details Patient Name: Date of Service: [name] ND L. 01/16/2020 8:45 A M Medical Record Number: [id number] Patient Account Number: [id number] Date of Birth/Sex: Treating RN: 07/28/1951 (69 y.o. [name] Primary Care Lyndsee Casa: [name] Other Clinician: Referring Avaneesh Pepitone: Treating Warrick Llera/Extender: [name]: 12 Wound Status Wound Number: 4 Primary Diabetic Wound/Ulcer of the Lower Extremity Etiology: Wound Location: Left, Lateral Foot Wound Healed - Epithelialized Wounding Event: Gradually Appeared Status: Date Acquired: 12/23/2019 Comorbid Congestive Heart Failure, Hypertension, Peripheral Venous Weeks Of Treatment: 1 History: Disease, Type II Diabetes Clustered Wound: No Photos Photo Uploaded By: Mikeal Hawthorne on 01/17/2020 08:44:01 Wound Measurements Length: (cm) Width: (cm) Depth: (cm) Area: (cm) Volume: (cm) 0 % Reduction in Area: 100% 0 % Reduction in Volume: 100% 0 Epithelialization: Large (67-100%) 0 Tunneling: No 0 Undermining: No Wound Description Classification: Grade 2 Wound Margin: Well defined, not attached Exudate Amount: None Present Foul Odor After Cleansing: No Slough/Fibrino No Wound Bed Granulation Amount: None Present (0%) Exposed Structure Necrotic Amount: None Present (0%) Fascia Exposed: No Fat Layer (Subcutaneous Tissue) Exposed: No Tendon Exposed: No Muscle Exposed: No Joint Exposed: No Bone Exposed: No Electronic Signature(s) Signed: 01/16/2020 6:01:01 PM By: Deon Pilling Signed: 01/20/2020 6:28:13 PM By: Levan Hurst RN, BSN Previous Signature: 01/16/2020 9:12:52 AM Version By: Sandre Kitty Entered By: Levan Hurst on 01/16/2020 09:13:57 -------------------------------------------------------------------------------- Vitals Details Patient Name: Date of Service: Andy Gauss [facility] [place] L. 01/16/2020 8:45 A M Medical Record Number: [id number] Patient Account  Number: [id number] Date of Birth/Sex: Treating RN: 09/21/1950 (69 y.o. [name] Primary Care Earnestine Tuohey: [name] Other Clinician: Referring Denzil Mceachron: Treating Reveca Desmarais/Extender: [name]: 12 Vital Signs Time Taken: 08:46 Temperature (F): 98.3 Height (in): 67 Pulse (bpm): 66 Weight (lbs): 173 Respiratory Rate (breaths/min): 18 Body Mass Index (BMI): 27.1 Blood Pressure (mmHg): 189/65 Reference Range: 80 - 120 mg / dl Electronic Signature(s) Signed: 01/16/2020 9:12:52 AM By: Sandre Kitty Entered By: Sandre Kitty on 01/16/2020 08:50:14

## 2020-01-20 NOTE — Telephone Encounter (Signed)
Received doc for [employer], pt is to send in proof of income, application is pending. Spoke with pt, gave pt the fax to submit info requested

## 2020-01-23 ENCOUNTER — Encounter (HOSPITAL_BASED_OUTPATIENT_CLINIC_OR_DEPARTMENT_OTHER): Payer: Medicare Other | Admitting: Internal Medicine

## 2020-01-23 ENCOUNTER — Other Ambulatory Visit: Payer: Self-pay

## 2020-01-23 DIAGNOSIS — E11621 Type 2 diabetes mellitus with foot ulcer: Secondary | ICD-10-CM | POA: Diagnosis not present

## 2020-01-27 DIAGNOSIS — H9122 Sudden idiopathic hearing loss, left ear: Secondary | ICD-10-CM | POA: Insufficient documentation

## 2020-01-27 NOTE — Progress Notes (Signed)
[name], [name] ([id number]) Visit Report for 01/23/2020 HPI Details Patient Name: Date of Service: [name] [facility] [place] L. 01/23/2020 8:45 A M Medical Record Number: [id number] Patient Account Number: [id number] Date of Birth/Sex: Treating RN: Oct 11, 1950 (69 y.o. [name] Primary Care Provider: Grant [name] Other Clinician: Referring Provider: Treating Provider/Extender: [name] in Treatment: 13 History of Present Illness HPI Description: ADMISSION 10/18/2019 Patient is a 69 year old man with type 2 diabetes peripheral neuropathy and a Charcot foot. The problem I think began in March 2020 just about a year ago when he was admitted to hospital from 10/19/2018 through 10/24/2018 with a wound on his left lateral foot intense cellulitis. MRI did not suggest osteomyelitis but did note a draining sinus. He apparently received a prolonged course of antibiotics. His hemoglobin A1c at that time was 13.9. He was referred to [name] where he appears to been followed for probably 5 or 6 months. He was subsequently referred himself to [name] of podiatry. He has been offloaded in a surgical shoe with a Pegasys sole. He has been mostly using Medihoney and episodic debridement of intense surrounding callus. He has been referred here for further management of this difficult the heel wound area and consideration of hyperbaric oxygen The patient had an angiogram in November. He did not have any proximal stenoses but he did have a 70 to 80% peroneal artery occlusion as well as an 80 to 95% stenosis of the anterior tibia which underwent an angioplasty. Follow-up ABIs were done on 07/02/2019 showed an ABI on the right of 1.26 on the left at 1.28 with TBI's of 0.65 and 0.67 respectively. Waveforms were triphasic Past medical history includes type 2 diabetes with neuropathy, PAD and retinopathy, prostate cancer, gastroesophageal reflux disease, chronic kidney disease, small  bowel obstruction 10/25/2019. Patient's x-ray of the left foot from last time showed no evidence of osteomyelitis. Chronic Charcot/neuropathic changes as expected. Notable soft tissue ulcer involving the plantar aspect underlying the forefoot. The patient actually has 2 wounds. One on the mid forefoot limited to breakdown of skin. The larger area is actually on the lateral foot. We have been using silver alginate to both wound areas. 3/15; patient came back for the obligatory first total contact cast change. He was apparently walking on the cast without the boot but otherwise tolerated it well and per our intake nurse the wounds look satisfactory. He will be back later in the week for his wound care visit 3/18; patient's wound on the plantar foot looks smaller of lateral foot about the same. Both required debridement. There was some macerated tissue between the 2 wounds which were also removed. We have been using silver alginate. 3/26 1 week follow-up. T contact cast. Plantar foot wound has closed over. The area on the lateral part of his foot is still open but smaller. Using silver otal alginate on the wounds 11/15/2019 upon evaluation today patient appears to be doing well with regard to his wound. He is making progress and fortunately the wound is measuring smaller and looking better at this point. Fortunately there is no signs of active infection which is also good news we been using a total contact cast up to this point. 4/9; small wound now on the left lateral foot not technically on the plantar surface although I think he may be somebody who walks partially on the outside of his foot. The more extensive wound on the plantar foot healed 2 weeks ago. The patient  tells me that his mother is recently passed away. He wants the total contact cast off Week in preparation for a funeral in [place]. We'll bring him back in on Wednesday to try and accommodate this. We also changed the [facility] might give [place] a quick chance to have a look at this on that date 4/13; patient is here for a total contact cast change. The wound looks quite healthy we reapplied Hydrofera Blue. He will be back next week for review 4/20; the patient's wound is slightly smaller. The area on the plantar foot remains healed. We have been using Hydrofera Blue 4/27 patient's wound is about half the size. Using Hydrofera Blue the plantar foot remains healed. Blood pressure very high today 5/4; the patient's wound is totally healed. Type 2 diabetes with a Charcot foot. Healed out in a total contact cast. He has a diabetic shoe with custom inserts that he got at [facility]. 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 -------------------------------------------------------------------------------------- [place] 01/09/20 -Patient's wound has opened up again on his left plantar foot with extension of hematoma to the left lateral edge. Patient denies any fevers or chills. It appears that he has been wearing regular shoes although he states most of the time he is using his custom fitted boot that he has had for 7 months 6/3; I am disturbed to see that the patient had to be readmitted to the clinic last week 3 weeks after we had healed him out with a total contact cast. He had diabetic shoes with custom inserts although he tells me he was only wearing them half the time. He has been to see [name] who is the foot where orthotist [facility] and he is recommending a bubble pocket offload which I am really fine with but I emphasized to the patient that he needs to wear these ALLTHE TIME. Suggested Bubble pocket off load MEZZ0 6/10; patient is in a total contact cast for a wound on the left midfoot. Dimensions are smaller. Wound surface is still friable. We  are using Hydrofera Blue under the total contact cast Electronic Signature(s) Signed: 01/27/2020 8:32:12 AM By: Linton Ham MD Entered By: Linton Ham on 01/23/2020 11:10:16 -------------------------------------------------------------------------------- Chemical Cauterization Details Patient Name: Date of Service: [name] ND L. 01/23/2020 8:45 A M Medical Record Number: [id number] Patient Account Number: [id number] Date of Birth/Sex: Treating RN: Jun 27, 1951 (69 y.o. [name] Primary Care Provider: Grant [name] Other Clinician: Referring Provider: Treating Provider/Extender: [name] in Treatment: 13 Procedure Performed for: Wound #3 [place] Performed By: Physician Ricard Dillon., MD Post Procedure Diagnosis Same as Pre-procedure Notes silver nitrate used. Electronic Signature(s) Signed: 01/27/2020 8:32:12 AM By: Linton Ham MD Entered By: Linton Ham on 01/23/2020 11:09:16 -------------------------------------------------------------------------------- Physical Exam Details Patient Name: Date of Service: [name] ND L. 01/23/2020 8:45 A M Medical Record Number: [id number] Patient Account Number: [id number] Date of Birth/Sex: Treating RN: [birth year] (69 y.o. [name] Primary Care Provider: Grant [name] Other Clinician: Referring Provider: Treating Provider/Extender: [name] in Treatment: 52 Constitutional Patient is hypertensive.. Pulse regular and within target range for patient.[name] Respirations regular, non-labored and within target range.. Temperature is normal and within the target range for the  patient.[name] Appears in no distress. Cardiovascular Pedal pulses palpable. Notes Wound exam; left midfoot. Wound is smaller epithelialization. The wound surface looks of healthy but very friable bleeds easily even when washing off the wound surface I used silver nitrate  on this Electronic Signature(s) Signed: 01/27/2020 8:32:12 AM By: Linton Ham MD Entered By: Linton Ham on 01/23/2020 11:11:14 -------------------------------------------------------------------------------- Physician Orders Details Patient Name: Date of Service: [name] [place] L. 01/23/2020 8:45 A M Medical Record Number: [id number] Patient Account Number: [id number] Date of Birth/Sex: Treating RN: [birth year] (69 y.o. [name] Primary Care Provider: Grant [name] Other Clinician: Referring Provider: Treating Provider/Extender: [name] in Treatment: 13 Verbal / Phone Orders: No Diagnosis Coding ICD-10 Coding Code Description [phone] Non-pressure chronic ulcer of other part of left foot with fat layer exposed E11.51 Type 2 diabetes mellitus with diabetic peripheral angiopathy without gangrene E11.621 Type 2 diabetes mellitus with foot ulcer L97.521 Non-pressure chronic ulcer of other part of left foot limited to breakdown of skin M14.672 Charcot's joint, left ankle and foot E11.40 Type 2 diabetes mellitus with diabetic neuropathy, unspecified Follow-up Appointments ppointment in 1 week. - T Contact Cast otal Return A Dressing Change Frequency Do not change entire dressing for one week. Wound Cleansing May shower with protection. Primary Wound Dressing Wound #3 Left,Plantar Foot Hydrofera Blue Secondary Dressing Foam - foam donut Dry Gauze Other: - please pad the left lateral foot for protection. Edema Control Avoid standing for long periods of time Elevate legs to the level of the heart or above for 30 minutes daily and/or when sitting, a frequency of: - throughout the day. Off-Loading Total Contact Cast to Left Lower Extremity Electronic Signature(s) Signed: 01/23/2020 5:25:48 PM By: Deon Pilling Signed: 01/27/2020 8:32:12 AM By: Linton Ham MD Entered By: Deon Pilling on 01/23/2020  09:53:09 -------------------------------------------------------------------------------- Problem List Details Patient Name: Date of Service: [name] [place] L. 01/23/2020 8:45 A M Medical Record Number: [id number] Patient Account Number: [id number] Date of Birth/Sex: Treating RN: 12-30-1950 (69 y.o. [name] Primary Care Provider: Grant [name] Other Clinician: Referring Provider: Treating Provider/Extender: [name] in Treatment: 13 Active Problems ICD-10 Encounter Code Description Active Date MDM Diagnosis [phone] Non-pressure chronic ulcer of other part of left foot with fat layer exposed 10/18/2019 No Yes E11.51 Type 2 diabetes mellitus with diabetic peripheral angiopathy without gangrene 10/18/2019 No Yes E11.621 Type 2 diabetes mellitus with foot ulcer 10/18/2019 No Yes L97.521 Non-pressure chronic ulcer of other part of left foot limited to breakdown of 10/25/2019 No Yes skin M14.672 Charcot's joint, left ankle and foot 10/18/2019 No Yes E11.40 Type 2 diabetes mellitus with diabetic neuropathy, unspecified 10/18/2019 No Yes Inactive Problems Resolved Problems Electronic Signature(s) Signed: 01/27/2020 8:32:12 AM By: Linton Ham MD Entered By: Linton Ham on 01/23/2020 11:08:38 -------------------------------------------------------------------------------- Progress Note Details Patient Name: Date of Service: [name] ND L. 01/23/2020 8:45 A M Medical Record Number: [id number] Patient Account Number: [id number] Date of Birth/Sex: Treating RN: 03-Aug-1951 (69 y.o. [name] Primary Care Provider: Grant [name] Other Clinician: Referring Provider: Treating Provider/Extender: [name] in Treatment: 13 Subjective History of Present Illness (HPI) ADMISSION 10/18/2019 Patient is a 69 year old man with type 2 diabetes peripheral neuropathy and a Charcot foot. The problem I think began in March 2020  just about a year ago when he was admitted to hospital from 10/19/2018 through 10/24/2018 with a wound on his left lateral foot intense cellulitis. MRI  did not suggest osteomyelitis but did note a draining sinus. He apparently received a prolonged course of antibiotics. His hemoglobin A1c at that time was 13.9. He was referred to [name] where he appears to been followed for probably 5 or 6 months. He was subsequently referred himself to [name] of podiatry. He has been offloaded in a surgical shoe with a Pegasys sole. He has been mostly using Medihoney and episodic debridement of intense surrounding callus. He has been referred here for further management of this difficult the heel wound area and consideration of hyperbaric oxygen The patient had an angiogram in November. He did not have any proximal stenoses but he did have a 70 to 80% peroneal artery occlusion as well as an 80 to 95% stenosis of the anterior tibia which underwent an angioplasty. Follow-up ABIs were done on 07/02/2019 showed an ABI on the right of 1.26 on the left at 1.28 with TBI's of 0.65 and 0.67 respectively. Waveforms were triphasic Past medical history includes type 2 diabetes with neuropathy, PAD and retinopathy, prostate cancer, gastroesophageal reflux disease, chronic kidney disease, small bowel obstruction 10/25/2019. Patient's x-ray of the left foot from last time showed no evidence of osteomyelitis. Chronic Charcot/neuropathic changes as expected. Notable soft tissue ulcer involving the plantar aspect underlying the forefoot. The patient actually has 2 wounds. One on the mid forefoot limited to breakdown of skin. The larger area is actually on the lateral foot. We have been using silver alginate to both wound areas. 3/15; patient came back for the obligatory first total contact cast change. He was apparently walking on the cast without the boot but otherwise tolerated it well and per our intake nurse the wounds look  satisfactory. He will be back later in the week for his wound care visit 3/18; patient's wound on the plantar foot looks smaller of lateral foot about the same. Both required debridement. There was some macerated tissue between the 2 wounds which were also removed. We have been using silver alginate. 3/26 1 week follow-up. T contact cast. Plantar foot wound has closed over. The area on the lateral part of his foot is still open but smaller. Using silver otal alginate on the wounds 11/15/2019 upon evaluation today patient appears to be doing well with regard to his wound. He is making progress and fortunately the wound is measuring smaller and looking better at this point. Fortunately there is no signs of active infection which is also good news we been using a total contact cast up to this point. 4/9; small wound now on the left lateral foot not technically on the plantar surface although I think he may be somebody who walks partially on the outside of his foot. The more extensive wound on the plantar foot healed 2 weeks ago. The patient tells me that his mother is recently passed away. He wants the total contact cast off Week in preparation for a funeral in [place]. We'll bring him back in on Wednesday to try and accommodate this. We also changed the [facility] might give [place] a quick chance to have a look at this on that date 4/13; patient is here for a total contact cast change. The wound looks quite healthy we reapplied Hydrofera Blue. He will be back next week for review 4/20; the patient's wound is slightly smaller. The area on the plantar foot remains healed. We have been using Hydrofera Blue 4/27 patient's wound is about half the size. Using Hydrofera Blue the plantar foot  remains healed. Blood pressure very high today 5/4; the patient's wound is totally healed. Type 2 diabetes with a Charcot foot. Healed out in a total contact cast. He has a diabetic shoe with custom inserts that  he got at [facility]. 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 -------------------------------------------------------------------------------------- [place] 01/09/20 -Patient's wound has opened up again on his left plantar foot with extension of hematoma to the left lateral edge. Patient denies any fevers or chills. It appears that he has been wearing regular shoes although he states most of the time he is using his custom fitted boot that he has had for 7 months 6/3; I am disturbed to see that the patient had to be readmitted to the clinic last week 3 weeks after we had healed him out with a total contact cast. He had diabetic shoes with custom inserts although he tells me he was only wearing them half the time. He has been to see [name] who is the foot where orthotist [facility] and he is recommending a bubble pocket offload which I am really fine with but I emphasized to the patient that he needs to wear these ALLTHE TIME. Suggested Bubble pocket off load MEZZ0 6/10; patient is in a total contact cast for a wound on the left midfoot. Dimensions are smaller. Wound surface is still friable. We are using Hydrofera Blue under the total contact cast Objective Constitutional Patient is hypertensive.. Pulse regular and within target range for patient.[name] Respirations regular, non-labored and within target range.. Temperature is normal and within the target range for the patient.[name] Appears in no distress. Vitals Time Taken: 9:35 AM, Height: 67 in, Weight: 173 lbs, BMI: 27.1, Temperature: 98.5 F, Pulse: 65 bpm, Respiratory Rate: 16 breaths/min, Blood Pressure: 187/78 mmHg, Capillary Blood Glucose: 80 mg/dl. General Notes: glucose per pt report Cardiovascular Pedal pulses palpable. General Notes: Wound exam; left midfoot. Wound  is smaller epithelialization. The wound surface looks of healthy but very friable bleeds easily even when washing off the wound surface I used silver nitrate on this Integumentary (Hair, Skin) Wound #3 status is Open. Original cause of wound was Gradually Appeared. The wound is located on the [place]. The wound measures 1.3cm length x 0.8cm width x 0.1cm depth; 0.817cm^2 area and 0.082cm^3 volume. There is Fat Layer (Subcutaneous Tissue) Exposed exposed. There is no tunneling or undermining noted. There is a medium amount of serosanguineous drainage noted. The wound margin is distinct with the outline attached to the wound base. There is large (67-100%) pink granulation within the wound bed. Assessment Active Problems ICD-10 Non-pressure chronic ulcer of other part of left foot with fat layer exposed Type 2 diabetes mellitus with diabetic peripheral angiopathy without gangrene Type 2 diabetes mellitus with foot ulcer Non-pressure chronic ulcer of other part of left foot limited to breakdown of skin Charcot's joint, left ankle and foot Type 2 diabetes mellitus with diabetic neuropathy, unspecified Procedures Wound #3 Pre-procedure diagnosis of Wound #3 is a Diabetic Wound/Ulcer of the Lower Extremity located on the Left,Plantar Foot . There was a T Contact Cast otal Procedure by Ricard Dillon., MD. Post procedure Diagnosis Wound #3: Same as Pre-Procedure Pre-procedure diagnosis of Wound #3 is a Diabetic Wound/Ulcer of the Lower Extremity located on the Left,Plantar Foot .  An Chemical Cauterization procedure was performed by Ricard Dillon., MD. Post procedure Diagnosis Wound #3: Same as Pre-Procedure Notes: silver nitrate used. Plan Follow-up Appointments: Return Appointment in 1 week. - T Contact Cast otal Dressing Change Frequency: Do not change entire dressing for one week. Wound Cleansing: May shower with protection. Primary Wound Dressing: Wound #3 Left,Plantar  Foot: Hydrofera Blue Secondary Dressing: Foam - foam donut Dry Gauze Other: - please pad the left lateral foot for protection. Edema Control: Avoid standing for long periods of time Elevate legs to the level of the heart or above for 30 minutes daily and/or when sitting, a frequency of: - throughout the day. Off-Loading: T Contact Cast to Left Lower Extremity otal 1. Hydrofera Blue still is the primary dressing 2. Continue total contact cast we are making progress 3. Awaiting hospiceoothe patient has a appointment with [name] at [facility] for foot wear orthotics Electronic Signature(s) Signed: 01/27/2020 8:32:12 AM By: Linton Ham MD Entered By: Linton Ham on 01/23/2020 11:12:13 -------------------------------------------------------------------------------- Total Contact Cast Details Patient Name: Date of Service: [name] ND L. 01/23/2020 8:45 A M Medical Record Number: [id number] Patient Account Number: [id number] Date of Birth/Sex: Treating RN: 1951-06-27 (69 y.o. [name] Primary Care Provider: Grant [name] Other Clinician: Referring Provider: Treating Provider/Extender: [name] in Treatment: 13 T Contact Cast Applied for Wound Assessment: otal Wound #3 Left,Plantar Foot Performed By: Physician Ricard Dillon., MD Post Procedure Diagnosis Same as Pre-procedure Electronic Signature(s) Signed: 01/27/2020 8:32:12 AM By: Linton Ham MD Entered By: Linton Ham on 01/23/2020 11:08:54 -------------------------------------------------------------------------------- SuperBill Details Patient Name: Date of Service: [name] ND L. 01/23/2020 Medical Record Number: [id number] Patient Account Number: [id number] Date of Birth/Sex: Treating RN: 02-25-1951 (69 y.o. [name] Primary Care Provider: Grant [name] Other Clinician: Referring Provider: Treating Provider/Extender: [name] in Treatment: 13 Diagnosis Coding ICD-10 Codes Code Description [phone] Non-pressure chronic ulcer of other part of left foot with fat layer exposed E11.51 Type 2 diabetes mellitus with diabetic peripheral angiopathy without gangrene E11.621 Type 2 diabetes mellitus with foot ulcer L97.521 Non-pressure chronic ulcer of other part of left foot limited to breakdown of skin M14.672 Charcot's joint, left ankle and foot E11.40 Type 2 diabetes mellitus with diabetic neuropathy, unspecified Facility Procedures CPT4 Code: [id number] Description: 68341 - CHEM CAUT GRANULATION TISS ICD-10 Diagnosis Description L97.522 Non-pressure chronic ulcer of other part of left foot with fat layer exposed Modifier: 59 Quantity: 1 CPT4 Code: [id number] Description: 89211 - APPLY TOTAL CONTACT LEG CAST ICD-10 Diagnosis Description L97.522 Non-pressure chronic ulcer of other part of left foot with fat layer exposed E11.621 Type 2 diabetes mellitus with foot ulcer Modifier: Quantity: 1 Physician Procedures : CPT4 Code Description Modifier [id number] - WC PHYS CHEM CAUT GRAN TISSUE 59 ICD-10 Diagnosis Description L97.522 Non-pressure chronic ulcer of other part of left foot with fat layer exposed Quantity: 1 : [id number] - WC PHYS APPLY TOTAL CONTACT CAST ICD-10 Diagnosis Description L97.522 Non-pressure chronic ulcer of other part of left foot with fat layer exposed E11.621 Type 2 diabetes mellitus with foot ulcer Quantity: 1 Electronic Signature(s) Signed: 01/23/2020 5:25:48 PM By: Deon Pilling Signed: 01/27/2020 8:32:12 AM By: Linton Ham MD Entered By: Deon Pilling on 01/23/2020 16:48:01

## 2020-01-28 ENCOUNTER — Other Ambulatory Visit: Payer: Medicare Other | Admitting: Orthotics

## 2020-01-29 NOTE — Progress Notes (Signed)
[place], [place] ([id number]) Visit Report for 01/23/2020 Arrival Information Details Patient Name: Date of Service: [name] [facility] [place] L. 01/23/2020 8:45 A M Medical Record Number: [id number] Patient Account Number: [id number] Date of Birth/Sex: Treating RN: 08/08/1951 (69 y.o. [name] Primary Care Tiant Peixoto: [name] Other Clinician: Referring Shahzaib Azevedo: Treating Walter Min/Extender: [name]: 13 Visit Information History Since Last Visit Added or deleted any medications: No Patient Arrived: Ambulatory Any new allergies or adverse reactions: No Arrival Time: 09:34 Had a fall or experienced change in No Accompanied By: alone activities of daily living that may affect Transfer Assistance: None risk of falls: Patient Identification Verified: Yes Signs or symptoms of abuse/neglect since last visito No Secondary Verification Process Completed: Yes Hospitalized since last visit: No Patient Requires Transmission-Based Precautions: No Implantable device outside of the clinic excluding No Patient Has Alerts: Yes cellular tissue based products placed in the center Patient Alerts: Patient on Blood Thinner since last visit: Has Dressing in Place as Prescribed: Yes Has Footwear/Offloading in Place as Prescribed: Yes Left: T Contact Cast otal Pain Present Now: No Electronic Signature(s) Signed: 01/23/2020 5:18:16 PM By: Levan Hurst RN, BSN Entered By: Levan Hurst on 01/23/2020 09:45:07 -------------------------------------------------------------------------------- Encounter Discharge Information Details Patient Name: Date of Service: [name] [place] L. 01/23/2020 8:45 A M Medical Record Number: [id number] Patient Account Number: [id number] Date of Birth/Sex: Treating RN: 04-13-1951 (68 y.o. [name] Primary Care Beryl Balz: [name] Other Clinician: Referring Vivek Grealish: Treating Tiasia Weberg/Extender: [name]: 13 Encounter Discharge Information Items Discharge Condition: Stable Ambulatory Status: Ambulatory Discharge Destination: Home Transportation: Private Auto Accompanied By: self Schedule Follow-up Appointment: Yes Clinical Summary of Care: Patient Declined Electronic Signature(s) Signed: 01/29/2020 9:54:21 AM By: Carlene Coria RN Entered By: Carlene Coria on 01/23/2020 10:29:40 -------------------------------------------------------------------------------- Lower Extremity Assessment Details Patient Name: Date of Service: [name] [facility] [place] L. 01/23/2020 8:45 A M Medical Record Number: [id number] Patient Account Number: [id number] Date of Birth/Sex: Treating RN: [birth year] (69 y.o. [name] Primary Care Kortne All: [name] Other Clinician: Referring Brelan Hannen: Treating Tabita Corbo/Extender: [name]: 13 Edema Assessment Assessed: Shirlyn Goltz: No] Patrice Paradise: No] Edema: [Left: N] [Right: o] Calf Left: Right: Point of Measurement: 41 cm From Medial Instep 36 cm cm Ankle Left: Right: Point of Measurement: 9 cm From Medial Instep 20 cm cm Vascular Assessment Pulses: Dorsalis Pedis Palpable: [Left:Yes] Electronic Signature(s) Signed: 01/23/2020 5:18:16 PM By: Levan Hurst RN, BSN Entered By: Levan Hurst on 01/23/2020 09:45:52 -------------------------------------------------------------------------------- Multi Wound Chart Details Patient Name: Date of Service: [name] [place] L. 01/23/2020 8:45 A M Medical Record Number: [id number] Patient Account Number: [id number] Date of Birth/Sex: Treating RN: [birth year] (69 y.o. [name] Primary Care Mayford Alberg: [name] Other Clinician: Referring Aala Ransom: Treating Aariyana Manz/Extender: [name]: 13 Vital Signs Height(in): 67 Capillary Blood Glucose(mg/dl): 80 Weight(lbs): 173 Pulse(bpm):  28 Body Mass Index(BMI): 27 Blood Pressure(mmHg): 187/78 Temperature(F): 98.5 Respiratory Rate(breaths/min): 16 Photos: [3:No Photos Left, Plantar Foot] [N/A:N/A N/A] Wound Location: [3:Gradually Appeared] [N/A:N/A] Wounding Event: [3:Diabetic Wound/Ulcer of the Lower] [N/A:N/A] Primary Etiology: [3:Extremity Congestive Heart Failure,] [N/A:N/A] Comorbid History: [3:Hypertension, Peripheral Venous Disease, Type II Diabetes 12/23/2019] [N/A:N/A] Date Acquired: [3:2] [N/A:N/A] Weeks of Treatment: [3:Open] [N/A:N/A] Wound Status: [3:1.3x0.8x0.1] [N/A:N/A] Measurements L x W x D (cm) [3:0.817] [N/A:N/A] A (cm) : rea [3:0.082] [N/A:N/A] Volume (cm) : [3:59.20%] [N/A:N/A] % Reduction in A rea: [3:59.00%] [  N/A:N/A] % Reduction in Volume: [3:Grade 2] [N/A:N/A] Classification: [3:Medium] [N/A:N/A] Exudate A mount: [3:Serosanguineous] [N/A:N/A] Exudate Type: [3:red, brown] [N/A:N/A] Exudate Color: [3:Distinct, outline attached] [N/A:N/A] Wound Margin: [3:Large (67-100%)] [N/A:N/A] Granulation A mount: [3:Pink] [N/A:N/A] Granulation Quality: [3:Fat Layer (Subcutaneous Tissue)] [N/A:N/A] Exposed Structures: [3:Exposed: Yes Fascia: No Tendon: No Muscle: No Joint: No Bone: No Medium (34-66%)] [N/A:N/A] Epithelialization: [3:Chemical Cauterization] [N/A:N/A] Procedures Performed: [3:T Contact Cast otal] Treatment Notes Wound #3 (Left, Plantar Foot) 1. Cleanse With Wound Cleanser Soap and water 2. Periwound Care Moisturizing lotion 3. Primary Dressing Applied Hydrofera Blue 4. Secondary Dressing Dry Gauze Foam 5. Secured With Tape 7. Footwear/Offloading device applied T Contact Cast otal Electronic Signature(s) Signed: 01/23/2020 5:25:48 PM By: Deon Pilling Signed: 01/27/2020 8:32:12 AM By: Linton Ham MD Entered By: Linton Ham on 01/23/2020 11:08:46 -------------------------------------------------------------------------------- Multi-Disciplinary Care Plan  Details Patient Name: Date of Service: [name] [place] L. 01/23/2020 8:45 A M Medical Record Number: [id number] Patient Account Number: [id number] Date of Birth/Sex: Treating RN: 01-26-1951 (69 y.o. [name] Primary Care Kaito Schulenburg: [name] Other Clinician: Referring Deborah Lazcano: Treating Gabriel Paulding/Extender: [name]: 13 Active Inactive Pain, Acute or Chronic Nursing Diagnoses: Pain, acute or chronic: actual or potential Goals: Patient/caregiver will verbalize adequate pain control between visits Date Initiated: 01/09/2020 Target Resolution Date: 03/13/2020 Goal Status: Active Interventions: Provide education on pain management Provision of support: recognize patient pain, provide comfort and support as needed Treatment Activities: Administer pain control measures as ordered : 01/09/2020 Notes: Electronic Signature(s) Signed: 01/23/2020 5:25:48 PM By: Deon Pilling Entered By: Deon Pilling on 01/23/2020 09:15:06 -------------------------------------------------------------------------------- Pain Assessment Details Patient Name: Date of Service: [name] [facility] ND L. 01/23/2020 8:45 A M Medical Record Number: [id number] Patient Account Number: [id number] Date of Birth/Sex: Treating RN: 1951-07-30 (69 y.o. [name] Primary Care Shavelle Runkel: [name] Other Clinician: Referring Anihya Tuma: Treating Aissata Wilmore/Extender: [name]: 13 Active Problems Location of Pain Severity and Description of Pain Patient Has Paino No Site Locations Pain Management and Medication Current Pain Management: Electronic Signature(s) Signed: 01/23/2020 5:18:16 PM By: Levan Hurst RN, BSN Entered By: Levan Hurst on 01/23/2020 09:45:48 -------------------------------------------------------------------------------- Patient/Caregiver Education Details Patient Name: Date of Service: [name] [place] L. 6/10/2021andnbsp8:45 A M Medical Record Number: [id number] Patient Account Number: [id number] Date of Birth/Gender: Treating RN: [birth year] (69 y.o. [name] Primary Care Physician: [name] Other Clinician: Referring Physician: Treating Physician/Extender: [name]: 13 Education Assessment Education Provided To: Patient Education Topics Provided Pain: Handouts: A Guide to Pain Control Methods: Explain/Verbal Responses: Reinforcements needed Electronic Signature(s) Signed: 01/23/2020 5:25:48 PM By: Deon Pilling Entered By: Deon Pilling on 01/23/2020 09:15:17 -------------------------------------------------------------------------------- Wound Assessment Details Patient Name: Date of Service: [name] [place] L. 01/23/2020 8:45 A M Medical Record Number: [id number] Patient Account Number: [id number] Date of Birth/Sex: Treating RN: February 20, 1951 (69 y.o. [name] Primary Care Ingram Onnen: [name] Other Clinician: Referring Kenasia Scheller: Treating Aleicia Kenagy/Extender: [name]: 13 Wound Status Wound Number: 3 Primary Diabetic Wound/Ulcer of the Lower Extremity Etiology: Wound Location: Left, Plantar Foot Wound Open Wounding Event: Gradually Appeared Status: Date Acquired: 12/23/2019 Comorbid Congestive Heart Failure, Hypertension, Peripheral Venous Weeks Of Treatment: 2 History: Disease, Type II Diabetes Clustered Wound: No Photos Photo Uploaded By: Mikeal Hawthorne on 01/27/2020 15:12:03 Wound Measurements Length: (cm) 1.3 Width: (cm) 0.8 Depth: (cm) 0.1 Area: (cm) 0.817 Volume: (cm) 0.082 Wound  Description Classification: Grade 2 Wound Margin: Distinct, outline attached Exudate Amount: Medium Exudate Type: Serosanguineous Exudate Color: red, brown Foul Odor After Cleansing: Slough/Fibrino % Reduction in Area: 59.2% % Reduction in Volume:  59% Epithelialization: Medium (34-66%) Tunneling: No Undermining: No No No Wound Bed Granulation Amount: Large (67-100%) Exposed Structure Granulation Quality: Pink Fascia Exposed: No Fat Layer (Subcutaneous Tissue) Exposed: Yes Tendon Exposed: No Muscle Exposed: No Joint Exposed: No Bone Exposed: No Treatment Notes Wound #3 (Left, Plantar Foot) 1. Cleanse With Wound Cleanser Soap and water 2. Periwound Care Moisturizing lotion 3. Primary Dressing Applied Hydrofera Blue 4. Secondary Dressing Dry Gauze Foam 5. Secured With Tape 7. Footwear/Offloading device applied T Contact Cast otal Electronic Signature(s) Signed: 01/23/2020 5:18:16 PM By: Levan Hurst RN, BSN Entered By: Levan Hurst on 01/23/2020 09:46:16 -------------------------------------------------------------------------------- Vitals Details Patient Name: Date of Service: [name] [facility] ND L. 01/23/2020 8:45 A M Medical Record Number: [id number] Patient Account Number: [id number] Date of Birth/Sex: Treating RN: 12-12-1950 (69 y.o. [name] Primary Care Guerry Covington: [name] Other Clinician: Referring Elizabeht Suto: Treating Layani Foronda/Extender: [name]: 13 Vital Signs Time Taken: 09:35 Temperature (F): 98.5 Height (in): 67 Pulse (bpm): 65 Weight (lbs): 173 Respiratory Rate (breaths/min): 16 Body Mass Index (BMI): 27.1 Blood Pressure (mmHg): 187/78 Capillary Blood Glucose (mg/dl): 80 Reference Range: 80 - 120 mg / dl Notes glucose per pt report Electronic Signature(s) Signed: 01/23/2020 5:18:16 PM By: Levan Hurst RN, BSN Entered By: Levan Hurst on 01/23/2020 09:45:42

## 2020-01-30 ENCOUNTER — Telehealth: Payer: Self-pay | Admitting: Family Medicine

## 2020-01-30 ENCOUNTER — Other Ambulatory Visit: Payer: Self-pay

## 2020-01-30 ENCOUNTER — Encounter (HOSPITAL_BASED_OUTPATIENT_CLINIC_OR_DEPARTMENT_OTHER): Payer: Medicare Other | Admitting: Internal Medicine

## 2020-01-30 ENCOUNTER — Ambulatory Visit: Payer: Medicare Other | Admitting: Family Medicine

## 2020-01-30 DIAGNOSIS — E11621 Type 2 diabetes mellitus with foot ulcer: Secondary | ICD-10-CM | POA: Diagnosis not present

## 2020-01-30 NOTE — Telephone Encounter (Signed)
Pt is wanting if a nurse could call or if he can come in a show pt how to administered the trulicity because he doesn't know how to use them. Please advise.

## 2020-01-31 ENCOUNTER — Other Ambulatory Visit: Payer: Self-pay | Admitting: Physician Assistant

## 2020-01-31 ENCOUNTER — Ambulatory Visit (INDEPENDENT_AMBULATORY_CARE_PROVIDER_SITE_OTHER): Payer: Medicare Other | Admitting: Registered Nurse

## 2020-01-31 ENCOUNTER — Other Ambulatory Visit: Payer: Self-pay

## 2020-01-31 DIAGNOSIS — E1169 Type 2 diabetes mellitus with other specified complication: Secondary | ICD-10-CM

## 2020-01-31 DIAGNOSIS — H9120 Sudden idiopathic hearing loss, unspecified ear: Secondary | ICD-10-CM

## 2020-01-31 DIAGNOSIS — Z794 Long term (current) use of insulin: Secondary | ICD-10-CM

## 2020-01-31 NOTE — Progress Notes (Signed)
Strohmeier, [place] ([id number]) Visit Report for 01/30/2020 Arrival Information Details Patient Name: Date of Service: [name] [facility] ND L. 01/30/2020 10:45 A M Medical Record Number: [id number] Patient Account Number: [id number] Date of Birth/Sex: Treating RN: 08-01-1951 (69 y.o. [name] Primary Care Allani Reber: [name] Other Clinician: Referring Braylee Bosher: Treating Ishana Blades/Extender: [name]: 14 Visit Information History Since Last Visit Added or deleted any medications: No Patient Arrived: Ambulatory Any new allergies or adverse reactions: No Arrival Time: 11:20 Had a fall or experienced change in No Accompanied By: self activities of daily living that may affect Transfer Assistance: None risk of falls: Patient Identification Verified: Yes Signs or symptoms of abuse/neglect since last visito No Secondary Verification Process Completed: Yes Hospitalized since last visit: No Patient Requires Transmission-Based Precautions: No Implantable device outside of the clinic excluding No Patient Has Alerts: Yes cellular tissue based products placed in the center Patient Alerts: Patient on Blood Thinner since last visit: Has Dressing in Place as Prescribed: Yes Pain Present Now: No Electronic Signature(s) Signed: 01/30/2020 12:45:44 PM By: Sandre Kitty Entered By: Sandre Kitty on 01/30/2020 11:21:16 -------------------------------------------------------------------------------- Encounter Discharge Information Details Patient Name: Date of Service: [name] ND L. 01/30/2020 10:45 A M Medical Record Number: [id number] Patient Account Number: [id number] Date of Birth/Sex: Treating RN: 02/08/1951 (69 y.o. [name] Primary Care Chrissi Crow: [name] Other Clinician: Referring Blakely Gluth: Treating Abel Hageman/Extender: [name]: 14 Encounter Discharge Information  Items Discharge Condition: Stable Ambulatory Status: Ambulatory Discharge Destination: Home Transportation: Private Auto Accompanied By: self Schedule Follow-up Appointment: Yes Clinical Summary of Care: Notes Educated patient at length about the use of table salt, diet, BP medication, and notifying PCP of high BP. Rechecked BP 213/85. MD made aware and also spoke with patient as well. patient in agreement to take BP medication and following up with PCP r/t BP. Electronic Signature(s) Signed: 01/30/2020 5:45:21 PM By: Deon Pilling Entered By: Deon Pilling on 01/30/2020 12:18:22 -------------------------------------------------------------------------------- Lower Extremity Assessment Details Patient Name: Date of Service: [name] [facility] ND L. 01/30/2020 10:45 A M Medical Record Number: [id number] Patient Account Number: [id number] Date of Birth/Sex: Treating RN: 24-Feb-1951 (69 y.o. [name] Primary Care Taijuan Serviss: [name] Other Clinician: Referring Shakirra Buehler: Treating Nashid Pellum/Extender: [name]: 14 Edema Assessment Assessed: Shirlyn Goltz: Yes] Patrice Paradise: No] Edema: [Left: Ye] [Right: s] Calf Left: Right: Point of Measurement: 41 cm From Medial Instep 35 cm cm Ankle Left: Right: Point of Measurement: 9 cm From Medial Instep 22 cm cm Vascular Assessment Pulses: Dorsalis Pedis Palpable: [Left:Yes] Electronic Signature(s) Signed: 01/30/2020 5:45:21 PM By: Deon Pilling Entered By: Deon Pilling on 01/30/2020 11:50:45 -------------------------------------------------------------------------------- Multi Wound Chart Details Patient Name: Date of Service: [name] ND L. 01/30/2020 10:45 A M Medical Record Number: [id number] Patient Account Number: [id number] Date of Birth/Sex: Treating RN: [birth year] (69 y.o. [name] Primary Care Isaah Furry: [name] Other Clinician: Referring Macgregor Aeschliman: Treating  Dalilah Curlin/Extender: [name]: 14 Vital Signs Height(in): 67 Capillary Blood Glucose(mg/dl): 152 Weight(lbs): 173 Pulse(bpm): 58 Body Mass Index(BMI): 27 Blood Pressure(mmHg): 200/81 Temperature(F): 98.1 Respiratory Rate(breaths/min): 16 Photos: [3:No Photos Left, Plantar Foot] [N/A:N/A N/A] Wound Location: [3:Gradually Appeared] [N/A:N/A] Wounding Event: [3:Diabetic Wound/Ulcer of the Lower] [N/A:N/A] Primary Etiology: [3:Extremity Congestive Heart Failure,] [N/A:N/A] Comorbid History: [3:Hypertension, Peripheral Venous Disease, Type II Diabetes 12/23/2019] [N/A:N/A] Date Acquired: [3:3] [N/A:N/A] Weeks of Treatment: [3:Open] [N/A:N/A] Wound Status: [  3:0.4x0.4x0.1] [N/A:N/A] Measurements L x W x D (cm) [3:0.126] [N/A:N/A] A (cm) : rea [3:0.013] [N/A:N/A] Volume (cm) : [3:93.70%] [N/A:N/A] % Reduction in A rea: [3:93.50%] [N/A:N/A] % Reduction in Volume: [3:Grade 2] [N/A:N/A] Classification: [3:Medium] [N/A:N/A] Exudate A mount: [3:Serosanguineous] [N/A:N/A] Exudate Type: [3:red, brown] [N/A:N/A] Exudate Color: [3:Distinct, outline attached] [N/A:N/A] Wound Margin: [3:Large (67-100%)] [N/A:N/A] Granulation A mount: [3:Pink] [N/A:N/A] Granulation Quality: [3:Fat Layer (Subcutaneous Tissue)] [N/A:N/A] Exposed Structures: [3:Exposed: Yes Fascia: No Tendon: No Muscle: No Joint: No Bone: No Large (67-100%)] [N/A:N/A] Epithelialization: [3:T Contact Cast otal] [N/A:N/A] Treatment Notes Wound #3 (Left, Plantar Foot) 1. Cleanse With Wound Cleanser Soap and water 3. Primary Dressing Applied Hydrofera Blue 4. Secondary Dressing Dry Gauze Foam 5. Secured With Medipore tape 7. Footwear/Offloading device applied T Contact Cast otal Notes TCC applied by MD. Electronic Signature(s) Signed: 01/30/2020 5:45:21 PM By: Deon Pilling Signed: 01/31/2020 7:45:08 AM By: Linton Ham MD Entered By: Linton Ham on 01/30/2020  12:24:28 -------------------------------------------------------------------------------- Multi-Disciplinary Care Plan Details Patient Name: Date of Service: [name] ND L. 01/30/2020 10:45 A M Medical Record Number: [id number] Patient Account Number: [id number] Date of Birth/Sex: Treating RN: 1951-01-24 (69 y.o. [name] Primary Care Toy Eisemann: [name] Other Clinician: Referring Lakiya Cottam: Treating Biddie Sebek/Extender: [name]: 14 Active Inactive Pain, Acute or Chronic Nursing Diagnoses: Pain, acute or chronic: actual or potential Goals: Patient/caregiver will verbalize adequate pain control between visits Date Initiated: 01/09/2020 Target Resolution Date: 03/13/2020 Goal Status: Active Interventions: Provide education on pain management Provision of support: recognize patient pain, provide comfort and support as needed Treatment Activities: Administer pain control measures as ordered : 01/09/2020 Notes: Electronic Signature(s) Signed: 01/30/2020 5:45:21 PM By: Deon Pilling Entered By: Deon Pilling on 01/30/2020 11:52:11 -------------------------------------------------------------------------------- Pain Assessment Details Patient Name: Date of Service: [name] [facility] [place] L. 01/30/2020 10:45 A M Medical Record Number: [id number] Patient Account Number: [id number] Date of Birth/Sex: Treating RN: 1951/04/14 (69 y.o. [name] Primary Care Takiya Belmares: [name] Other Clinician: Referring Rex Magee: Treating Sully Dyment/Extender: [name]: 14 Active Problems Location of Pain Severity and Description of Pain Patient Has Paino No Site Locations Pain Management and Medication Current Pain Management: Electronic Signature(s) Signed: 01/30/2020 12:45:44 PM By: Sandre Kitty Signed: 01/30/2020 5:45:21 PM By: Deon Pilling Entered By: Sandre Kitty on 01/30/2020  11:22:29 -------------------------------------------------------------------------------- Patient/Caregiver Education Details Patient Name: Date of Service: [name] ND L. 6/17/2021andnbsp10:45 A M Medical Record Number: [id number] Patient Account Number: [id number] Date of Birth/Gender: Treating RN: 12-11-1950 (69 y.o. [name] Primary Care Physician: [name] Other Clinician: Referring Physician: Treating Physician/Extender: [name]: 14 Education Assessment Education Provided To: Patient Education Topics Provided Pain: Handouts: A Guide to Pain Control Methods: Explain/Verbal Responses: Reinforcements needed Electronic Signature(s) Signed: 01/30/2020 5:45:21 PM By: Deon Pilling Entered By: Deon Pilling on 01/30/2020 11:52:23 -------------------------------------------------------------------------------- Wound Assessment Details Patient Name: Date of Service: [name] [facility] ND L. 01/30/2020 10:45 A M Medical Record Number: [id number] Patient Account Number: [id number] Date of Birth/Sex: Treating RN: [birth year] (69 y.o. [name] Primary Care Kaitlin Ardito: [name] Other Clinician: Referring Dezirea Mccollister: Treating Caetano Oberhaus/Extender: [name]: 14 Wound Status Wound Number: 3 Primary Diabetic Wound/Ulcer of the Lower Extremity Etiology: Wound Location: Left, Plantar Foot Wound Open Wounding Event: Gradually Appeared Status: Date Acquired: 12/23/2019 Comorbid Congestive Heart Failure, Hypertension, Peripheral Venous Weeks Of Treatment: 3 History: Disease, Type II Diabetes  Clustered Wound: No Wound Measurements Length: (cm) 0.4 Width: (cm) 0.4 Depth: (cm) 0.1 Area: (cm) 0.126 Volume: (cm) 0.013 % Reduction in Area: 93.7% % Reduction in Volume: 93.5% Epithelialization: Large (67-100%) Tunneling: No Undermining: No Wound Description Classification:  Grade 2 Wound Margin: Distinct, outline attached Exudate Amount: Medium Exudate Type: Serosanguineous Exudate Color: red, brown Foul Odor After Cleansing: No Slough/Fibrino No Wound Bed Granulation Amount: Large (67-100%) Exposed Structure Granulation Quality: Pink Fascia Exposed: No Fat Layer (Subcutaneous Tissue) Exposed: Yes Tendon Exposed: No Muscle Exposed: No Joint Exposed: No Bone Exposed: No Treatment Notes Wound #3 (Left, Plantar Foot) 1. Cleanse With Wound Cleanser Soap and water 3. Primary Dressing Applied Hydrofera Blue 4. Secondary Dressing Dry Gauze Foam 5. Secured With Medipore tape 7. Footwear/Offloading device applied T Contact Cast otal Notes TCC applied by MD. Electronic Signature(s) Signed: 01/30/2020 5:45:21 PM By: Deon Pilling Entered By: Deon Pilling on 01/30/2020 11:50:56 -------------------------------------------------------------------------------- Vitals Details Patient Name: Date of Service: [name] ND L. 01/30/2020 10:45 A M Medical Record Number: [id number] Patient Account Number: [id number] Date of Birth/Sex: Treating RN: 01-10-1951 (69 y.o. [name] Primary Care Paullette Mckain: [name] Other Clinician: Referring Bryony Kaman: Treating Josean Lycan/Extender: [name]: 14 Vital Signs Time Taken: 11:21 Temperature (F): 98.1 Height (in): 67 Pulse (bpm): 68 Weight (lbs): 173 Respiratory Rate (breaths/min): 16 Body Mass Index (BMI): 27.1 Blood Pressure (mmHg): 200/81 Capillary Blood Glucose (mg/dl): 152 Reference Range: 80 - 120 mg / dl Notes Per patient has not taken his BP medication today. Per patient ate an omelet with gauze and added salt. Educated patient at length about medication, lowering BP, and diet associated with HTN. MD made aware of patient's elevated BP. Electronic Signature(s) Signed: 01/30/2020 5:45:21 PM By: Deon Pilling Entered By: Deon Pilling on  01/30/2020 11:53:50

## 2020-01-31 NOTE — Progress Notes (Signed)
Showed [name] how to use is Trulicity. The pharmacy did not show him correctly and ended up wasting one of his pens. He has taken it himself today and verbally repeated the instructions back to me to confirm his understanding.   Thanks, [employer]

## 2020-01-31 NOTE — Telephone Encounter (Signed)
I have called pt and placed him on the nurse visit to learn how to use his Trulicity for today at 3pm.   Pt stated understanding.

## 2020-01-31 NOTE — Progress Notes (Signed)
Mckelvie, [place] ([id number]) Visit Report for 01/30/2020 HPI Details Patient Name: Date of Service: [name] [facility] [place] L. 01/30/2020 10:45 A M Medical Record Number: [id number] Patient Account Number: [id number] Date of Birth/Sex: Treating RN: September 12, 1950 (69 y.o. [name] Primary Care Provider: [name] Other Clinician: Referring Provider: Treating Provider/Extender: [name]: 14 History of Present Illness HPI Description: ADMISSION 10/18/2019 Patient is a 69 year old man with type 2 diabetes peripheral neuropathy and a Charcot foot. The problem I think began in March 2020 just about a year ago when he was admitted to hospital from 10/19/2018 through 10/24/2018 with a wound on his left lateral foot intense cellulitis. MRI did not suggest osteomyelitis but did note a draining sinus. He apparently received a prolonged course of antibiotics. His hemoglobin A1c at that time was 13.9. He was referred to Dr. [name] where he appears to been followed for probably 5 or 6 months. He was subsequently referred himself to Dr. [name] of podiatry. He has been offloaded in a surgical shoe with a Pegasys sole. He has been mostly using Medihoney and episodic debridement of intense surrounding callus. He has been referred here for further management of this difficult the heel wound area and consideration of hyperbaric oxygen The patient had an angiogram in November. He did not have any proximal stenoses but he did have a 70 to 80% peroneal artery occlusion as well as an 80 to 95% stenosis of the anterior tibia which underwent an angioplasty. Follow-up ABIs were done on 07/02/2019 showed an ABI on the right of 1.26 on the left at 1.28 with TBI's of 0.65 and 0.67 respectively. Waveforms were triphasic Past medical history includes type 2 diabetes with neuropathy, PAD and retinopathy, prostate cancer, gastroesophageal reflux disease, chronic kidney disease, small  bowel obstruction 10/25/2019. Patient's x-ray of the left foot from last time showed no evidence of osteomyelitis. Chronic Charcot/neuropathic changes as expected. Notable soft tissue ulcer involving the plantar aspect underlying the forefoot. The patient actually has 2 wounds. One on the mid forefoot limited to breakdown of skin. The larger area is actually on the lateral foot. We have been using silver alginate to both wound areas. 3/15; patient came back for the obligatory first total contact cast change. He was apparently walking on the cast without the boot but otherwise tolerated it well and per our intake nurse the wounds look satisfactory. He will be back later in the week for his wound care visit 3/18; patient's wound on the plantar foot looks smaller of lateral foot about the same. Both required debridement. There was some macerated tissue between the 2 wounds which were also removed. We have been using silver alginate. 3/26 1 week follow-up. T contact cast. Plantar foot wound has closed over. The area on the lateral part of his foot is still open but smaller. Using silver otal alginate on the wounds 11/15/2019 upon evaluation today patient appears to be doing well with regard to his wound. He is making progress and fortunately the wound is measuring smaller and looking better at this point. Fortunately there is no signs of active infection which is also good news we been using a total contact cast up to this point. 4/9; small wound now on the left lateral foot not technically on the plantar surface although I think he may be somebody who walks partially on the outside of his foot. The more extensive wound on the plantar foot healed 2 weeks ago. The patient  tells me that his mother is recently passed away. He wants the total contact cast off Week in preparation for a funeral in [place]. We'll bring him back in on Wednesday to try and accommodate this. We also changed the [facility] might give [place] a quick chance to have a look at this on that date 4/13; patient is here for a total contact cast change. The wound looks quite healthy we reapplied Hydrofera Blue. He will be back next week for review 4/20; the patient's wound is slightly smaller. The area on the plantar foot remains healed. We have been using Hydrofera Blue 4/27 patient's wound is about half the size. Using Hydrofera Blue the plantar foot remains healed. Blood pressure very high today 5/4; the patient's wound is totally healed. Type 2 diabetes with a Charcot foot. Healed out in a total contact cast. He has a diabetic shoe with custom inserts that he got at [facility]. 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 -------------------------------------------------------------------------------------- [name] 01/09/20 -Patient's wound has opened up again on his left plantar foot with extension of hematoma to the left lateral edge. Patient denies any fevers or chills. It appears that he has been wearing regular shoes although he states most of the time he is using his custom fitted boot that he has had for 7 months 6/3; I am disturbed to see that the patient had to be readmitted to the clinic last week 3 weeks after we had healed him out with a total contact cast. He had diabetic shoes with custom inserts although he tells me he was only wearing them half the time. He has been to see Liliane Channel who is the foot where orthotist [facility] and he is recommending a bubble pocket offload which I am really fine with but I emphasized to the patient that he needs to wear these ALLTHE TIME. Suggested Bubble pocket off load MEZZ0 6/10; patient is in a total contact cast for a wound on the left midfoot. Dimensions are smaller. Wound surface is still friable. We  are using Hydrofera Blue under the total contact cast 6/17; only a very small open area remains. The surface looks clean. Using Hydrofera Blue under the total contact cast. This very well could be closed again next week. He is always said that he has a scheduled appointment with [employer] in [place]. I have asked him to make this for next week under the premise that this actually may heal. It if is not healed then I will probably give him an offloading shoe bring him back to reapply the cast but at least given the opportunity to go to see about custom-made shoes. He has severe neuropathy Charcot deformity secondary diabetes Electronic Signature(s) Signed: 01/31/2020 7:45:08 AM By: Linton Ham MD Entered By: Linton Ham on 01/30/2020 12:26:12 -------------------------------------------------------------------------------- Physical Exam Details Patient Name: Date of Service: [name] ND L. 01/30/2020 10:45 A M Medical Record Number: [id number] Patient Account Number: [id number] Date of Birth/Sex: Treating RN: 12/20/1950 (69 y.o. [name] Primary Care Provider: [name] Other Clinician: Referring Provider: Treating Provider/Extender: [name]: 14 Constitutional Patient is hypertensive.. Pulse regular and within target range for patient.[name] Respirations regular, non-labored and within target range.. Temperature is normal  and within the target range for the patient.[name] Appears in no distress. Cardiovascular Pedal pulses are palpable. Notes Wound exam; left midfoot. Very small healthy looking wound with nice rims of epithelialization. No debridement is required. There is no evidence of infection. We have reapplied a total contact cast in the standard fashion Electronic Signature(s) Signed: 01/31/2020 7:45:08 AM By: Linton Ham MD Entered By: Linton Ham on 01/30/2020  12:27:06 -------------------------------------------------------------------------------- Physician Orders Details Patient Name: Date of Service: [name] ND L. 01/30/2020 10:45 A M Medical Record Number: [id number] Patient Account Number: [id number] Date of Birth/Sex: Treating RN: [birth year] (69 y.o. [name] Primary Care Provider: [name] Other Clinician: Referring Provider: Treating Provider/Extender: [name]: 14 Verbal / Phone Orders: No Diagnosis Coding ICD-10 Coding Code Description [phone] Non-pressure chronic ulcer of other part of left foot with fat layer exposed E11.51 Type 2 diabetes mellitus with diabetic peripheral angiopathy without gangrene E11.621 Type 2 diabetes mellitus with foot ulcer L97.521 Non-pressure chronic ulcer of other part of left foot limited to breakdown of skin M14.672 Charcot's joint, left ankle and foot E11.40 Type 2 diabetes mellitus with diabetic neuropathy, unspecified Follow-up Appointments ppointment in 1 week. - T Contact Cast Thursday afternoon otal Return A Dressing Change Frequency Do not change entire dressing for one week. Wound Cleansing May shower with protection. Primary Wound Dressing Wound #3 Left,Plantar Foot Hydrofera Blue Secondary Dressing Foam - foam donut Dry Gauze Other: - please pad the left lateral foot for protection. Edema Control Avoid standing for long periods of time Elevate legs to the level of the heart or above for 30 minutes daily and/or when sitting, a frequency of: - throughout the day. Off-Loading Total Contact Cast to Left Lower Extremity Electronic Signature(s) Signed: 01/30/2020 5:45:21 PM By: Deon Pilling Signed: 01/31/2020 7:45:08 AM By: Linton Ham MD Entered By: Deon Pilling on 01/30/2020 11:54:26 -------------------------------------------------------------------------------- Problem List Details Patient Name: Date of  Service: [name] ND L. 01/30/2020 10:45 A M Medical Record Number: [id number] Patient Account Number: [id number] Date of Birth/Sex: Treating RN: [birth year] (69 y.o. [name] Primary Care Provider: [name] Other Clinician: Referring Provider: Treating Provider/Extender: [name]: 14 Active Problems ICD-10 Encounter Code Description Active Date MDM Diagnosis [phone] Non-pressure chronic ulcer of other part of left foot with fat layer exposed 10/18/2019 No Yes E11.51 Type 2 diabetes mellitus with diabetic peripheral angiopathy without gangrene 10/18/2019 No Yes E11.621 Type 2 diabetes mellitus with foot ulcer 10/18/2019 No Yes L97.521 Non-pressure chronic ulcer of other part of left foot limited to breakdown of 10/25/2019 No Yes skin M14.672 Charcot's joint, left ankle and foot 10/18/2019 No Yes E11.40 Type 2 diabetes mellitus with diabetic neuropathy, unspecified 10/18/2019 No Yes Inactive Problems Resolved Problems Electronic Signature(s) Signed: 01/31/2020 7:45:08 AM By: Linton Ham MD Entered By: Linton Ham on 01/30/2020 12:24:15 -------------------------------------------------------------------------------- Progress Note Details Patient Name: Date of Service: [name] ND L. 01/30/2020 10:45 A M Medical Record Number: [id number] Patient Account Number: [id number] Date of Birth/Sex: Treating RN: 12-May-1951 (69 y.o. [name] Primary Care Provider: [name] Other Clinician: Referring Provider: Treating Provider/Extender: [name]: 14 Subjective History of Present Illness (HPI) ADMISSION 10/18/2019 Patient is a 69 year old man with type 2 diabetes peripheral neuropathy and a Charcot foot. The problem I think began in March 2020 just about a year ago when he was admitted to hospital from 10/19/2018 through  10/24/2018 with a wound on his left lateral foot intense  cellulitis. MRI did not suggest osteomyelitis but did note a draining sinus. He apparently received a prolonged course of antibiotics. His hemoglobin A1c at that time was 13.9. He was referred to Dr. [name] where he appears to been followed for probably 5 or 6 months. He was subsequently referred himself to Dr. [name] of podiatry. He has been offloaded in a surgical shoe with a Pegasys sole. He has been mostly using Medihoney and episodic debridement of intense surrounding callus. He has been referred here for further management of this difficult the heel wound area and consideration of hyperbaric oxygen The patient had an angiogram in November. He did not have any proximal stenoses but he did have a 70 to 80% peroneal artery occlusion as well as an 80 to 95% stenosis of the anterior tibia which underwent an angioplasty. Follow-up ABIs were done on 07/02/2019 showed an ABI on the right of 1.26 on the left at 1.28 with TBI's of 0.65 and 0.67 respectively. Waveforms were triphasic Past medical history includes type 2 diabetes with neuropathy, PAD and retinopathy, prostate cancer, gastroesophageal reflux disease, chronic kidney disease, small bowel obstruction 10/25/2019. Patient's x-ray of the left foot from last time showed no evidence of osteomyelitis. Chronic Charcot/neuropathic changes as expected. Notable soft tissue ulcer involving the plantar aspect underlying the forefoot. The patient actually has 2 wounds. One on the mid forefoot limited to breakdown of skin. The larger area is actually on the lateral foot. We have been using silver alginate to both wound areas. 3/15; patient came back for the obligatory first total contact cast change. He was apparently walking on the cast without the boot but otherwise tolerated it well and per our intake nurse the wounds look satisfactory. He will be back later in the week for his wound care visit 3/18; patient's wound on the plantar foot looks smaller of  lateral foot about the same. Both required debridement. There was some macerated tissue between the 2 wounds which were also removed. We have been using silver alginate. 3/26 1 week follow-up. T contact cast. Plantar foot wound has closed over. The area on the lateral part of his foot is still open but smaller. Using silver otal alginate on the wounds 11/15/2019 upon evaluation today patient appears to be doing well with regard to his wound. He is making progress and fortunately the wound is measuring smaller and looking better at this point. Fortunately there is no signs of active infection which is also good news we been using a total contact cast up to this point. 4/9; small wound now on the left lateral foot not technically on the plantar surface although I think he may be somebody who walks partially on the outside of his foot. The more extensive wound on the plantar foot healed 2 weeks ago. The patient tells me that his mother is recently passed away. He wants the total contact cast off Week in preparation for a funeral in [place]. We'll bring him back in on Wednesday to try and accommodate this. We also changed the [facility] might give [place] a quick chance to have a look at this on that date 4/13; patient is here for a total contact cast change. The wound looks quite healthy we reapplied Hydrofera Blue. He will be back next week for review 4/20; the patient's wound is slightly smaller. The area on the plantar foot remains healed. We have been using [facility] 4/27  patient's wound is about half the size. Using Hydrofera Blue the plantar foot remains healed. Blood pressure very high today 5/4; the patient's wound is totally healed. Type 2 diabetes with a Charcot foot. Healed out in a total contact cast. He has a diabetic shoe with custom inserts that he got at [facility]. The patient works on a machine he wants to be able to return to work at this point I do not know that  there is anything I can tell him other than to keep this area protected in his diabetic shoe. -------------------------------------------------------------------------------------- Solana [name] 01/09/20 -Patient's wound has opened up again on his left plantar foot with extension of hematoma to the left lateral edge. Patient denies any fevers or chills. It appears that he has been wearing regular shoes although he states most of the time he is using his custom fitted boot that he has had for 7 months 6/3; I am disturbed to see that the patient had to be readmitted to the clinic last week 3 weeks after we had healed him out with a total contact cast. He had diabetic shoes with custom inserts although he tells me he was only wearing them half the time. He has been to see Liliane Channel who is the foot where orthotist [facility] and he is recommending a bubble pocket offload which I am really fine with but I emphasized to the patient that he needs to wear these ALLTHE TIME. Suggested Bubble pocket off load MEZZ0 6/10; patient is in a total contact cast for a wound on the left midfoot. Dimensions are smaller. Wound surface is still friable. We are using Hydrofera Blue under the total contact cast 6/17; only a very small open area remains. The surface looks clean. Using Hydrofera Blue under the total contact cast. This very well could be closed again next week. He is always said that he has a scheduled appointment with [employer] in [place]. I have asked him to make this for next week under the premise that this actually may heal. It if is not healed then I will probably give him an offloading shoe bring him back to reapply the cast but at least given the opportunity to go to see about custom-made shoes. He has severe neuropathy Charcot deformity secondary diabetes Objective Constitutional Patient is hypertensive.. Pulse regular and within target range for patient.[name] Respirations regular, non-labored  and within target range.. Temperature is normal and within the target range for the patient.[name] Appears in no distress. Vitals Time Taken: 11:21 AM, Height: 67 in, Weight: 173 lbs, BMI: 27.1, Temperature: 98.1 F, Pulse: 68 bpm, Respiratory Rate: 16 breaths/min, Blood Pressure: 200/81 mmHg, Capillary Blood Glucose: 152 mg/dl. General Notes: Per patient has not taken his BP medication today. Per patient ate an omelet with gauze and added salt. Educated patient at length about medication, lowering BP, and diet associated with HTN. MD made aware of patient's elevated BP. Cardiovascular Pedal pulses are palpable. General Notes: Wound exam; left midfoot. Very small healthy looking wound with nice rims of epithelialization. No debridement is required. There is no evidence of infection. We have reapplied a total contact cast in the standard fashion Integumentary (Hair, Skin) Wound #3 status is Open. Original cause of wound was Gradually Appeared. The wound is located on the [place]. The wound measures 0.4cm length x 0.4cm width x 0.1cm depth; 0.126cm^2 area and 0.013cm^3 volume. There is Fat Layer (Subcutaneous Tissue) Exposed exposed. There is no tunneling or undermining  noted. There is a medium amount of serosanguineous drainage noted. The wound margin is distinct with the outline attached to the wound base. There is large (67-100%) pink granulation within the wound bed. Assessment Active Problems ICD-10 Non-pressure chronic ulcer of other part of left foot with fat layer exposed Type 2 diabetes mellitus with diabetic peripheral angiopathy without gangrene Type 2 diabetes mellitus with foot ulcer Non-pressure chronic ulcer of other part of left foot limited to breakdown of skin Charcot's joint, left ankle and foot Type 2 diabetes mellitus with diabetic neuropathy, unspecified Procedures Wound #3 Pre-procedure diagnosis of Wound #3 is a Diabetic Wound/Ulcer of the Lower Extremity located  on the Left,Plantar Foot . There was a T Contact Cast otal Procedure by Ricard Dillon., MD. Post procedure Diagnosis Wound #3: Same as Pre-Procedure Plan Follow-up Appointments: Return Appointment in 1 week. - T Contact Cast Thursday afternoon otal Dressing Change Frequency: Do not change entire dressing for one week. Wound Cleansing: May shower with protection. Primary Wound Dressing: Wound #3 Left,Plantar Foot: Hydrofera Blue Secondary Dressing: Foam - foam donut Dry Gauze Other: - please pad the left lateral foot for protection. Edema Control: Avoid standing for long periods of time Elevate legs to the level of the heart or above for 30 minutes daily and/or when sitting, a frequency of: - throughout the day. Off-Loading: T Contact Cast to Left Lower Extremity otal 1. Reapplied the Hydrofera Blue under a total contact cast 2. This should be healed by next week. If it is not then I will give him an offloading shoe with felt so he can go at least to have his foot looked at. He is going to need custom diabetic shoes with inserts. 3. He had severely high blood pressure today. He tells [place] he has not taken his blood pressure medications today. I have asked him to go back and see his primary physician. Electronic Signature(s) Signed: 01/31/2020 7:45:08 AM By: Linton Ham MD Entered By: Linton Ham on 01/30/2020 12:28:22 -------------------------------------------------------------------------------- Total Contact Cast Details Patient Name: Date of Service: [name] ND L. 01/30/2020 10:45 A M Medical Record Number: [id number] Patient Account Number: [id number] Date of Birth/Sex: Treating RN: July 21, 1951 (69 y.o. [name] Primary Care Provider: [name] Other Clinician: Referring Provider: Treating Provider/Extender: [name]: 46 T Contact Cast Applied for Wound Assessment: otal Wound #3 Left,Plantar  Foot Performed By: Physician Ricard Dillon., MD Post Procedure Diagnosis Same as Pre-procedure Electronic Signature(s) Signed: 01/31/2020 7:45:08 AM By: Linton Ham MD Entered By: Linton Ham on 01/30/2020 12:24:59 -------------------------------------------------------------------------------- SuperBill Details Patient Name: Date of Service: [name] YMO ND L. 01/30/2020 Medical Record Number: [id number] Patient Account Number: [id number] Date of Birth/Sex: Treating RN: May 10, 1951 (69 y.o. [name] Primary Care Provider: [name] Other Clinician: Referring Provider: Treating Provider/Extender: [name]: 14 Diagnosis Coding ICD-10 Codes Code Description [phone] Non-pressure chronic ulcer of other part of left foot with fat layer exposed E11.51 Type 2 diabetes mellitus with diabetic peripheral angiopathy without gangrene E11.621 Type 2 diabetes mellitus with foot ulcer L97.521 Non-pressure chronic ulcer of other part of left foot limited to breakdown of skin M14.672 Charcot's joint, left ankle and foot E11.40 Type 2 diabetes mellitus with diabetic neuropathy, unspecified Facility Procedures CPT4 Code: [id number] Description: [phone] - APPLY TOTAL CONTACT LEG CAST ICD-10 Diagnosis Description L97.522 Non-pressure chronic ulcer of other part of left foot with fat layer exposed Modifier: Quantity: 1  Physician Procedures Electronic Signature(s) Signed: 01/31/2020 7:45:08 AM By: Linton Ham MD Entered By: Linton Ham on 01/30/2020 22:48:25

## 2020-02-03 ENCOUNTER — Ambulatory Visit (INDEPENDENT_AMBULATORY_CARE_PROVIDER_SITE_OTHER): Payer: Medicare Other | Admitting: Emergency Medicine

## 2020-02-03 VITALS — BP 148/78 | Ht 66.0 in | Wt 171.0 lb

## 2020-02-03 DIAGNOSIS — Z0000 Encounter for general adult medical examination without abnormal findings: Secondary | ICD-10-CM | POA: Diagnosis not present

## 2020-02-03 NOTE — Patient Instructions (Signed)
Thank you for taking time to come for your Medicare Wellness Visit. I appreciate your ongoing commitment to your health goals. Please review the following plan we discussed and let me know if I can assist you in the future.  [name] Kennedy LPN  Preventive Care 71 Years and Older, Male Preventive care refers to lifestyle choices and visits with your health care provider that can promote health and wellness. This includes:  A yearly physical exam. This is also called an annual well check.  Regular dental and eye exams.  Immunizations.  Screening for certain conditions.  Healthy lifestyle choices, such as diet and exercise. What can I expect for my preventive care visit? Physical exam Your health care provider will check:  Height and weight. These may be used to calculate body mass index (BMI), which is a measurement that tells if you are at a healthy weight.  Heart rate and blood pressure.  Your skin for abnormal spots. Counseling Your health care provider may ask you questions about:  Alcohol, tobacco, and drug use.  Emotional well-being.  Home and relationship well-being.  Sexual activity.  Eating habits.  History of falls.  Memory and ability to understand (cognition).  Work and work Statistician. What immunizations do I need?  Influenza (flu) vaccine  This is recommended every year. Tetanus, diphtheria, and pertussis (Tdap) vaccine  You may need a Td booster every 10 years. Varicella (chickenpox) vaccine  You may need this vaccine if you have not already been vaccinated. Zoster (shingles) vaccine  You may need this after age 66. Pneumococcal conjugate (PCV13) vaccine  One dose is recommended after age 84. Pneumococcal polysaccharide (PPSV23) vaccine  One dose is recommended after age 88. Measles, mumps, and rubella (MMR) vaccine  You may need at least one dose of MMR if you were born in 1957 or later. You may also need a second dose. Meningococcal  conjugate (MenACWY) vaccine  You may need this if you have certain conditions. Hepatitis A vaccine  You may need this if you have certain conditions or if you travel or work in places where you may be exposed to hepatitis A. Hepatitis B vaccine  You may need this if you have certain conditions or if you travel or work in places where you may be exposed to hepatitis B. Haemophilus influenzae type b (Hib) vaccine  You may need this if you have certain conditions. You may receive vaccines as individual doses or as more than one vaccine together in one shot (combination vaccines). Talk with your health care provider about the risks and benefits of combination vaccines. What tests do I need? Blood tests  Lipid and cholesterol levels. These may be checked every 5 years, or more frequently depending on your overall health.  Hepatitis C test.  Hepatitis B test. Screening  Lung cancer screening. You may have this screening every year starting at age 24 if you have a 30-pack-year history of smoking and currently smoke or have quit within the past 15 years.  Colorectal cancer screening. All adults should have this screening starting at age 52 and continuing until age 61. Your health care provider may recommend screening at age 57 if you are at increased risk. You will have tests every 1-10 years, depending on your results and the type of screening test.  Prostate cancer screening. Recommendations will vary depending on your family history and other risks.  Diabetes screening. This is done by checking your blood sugar (glucose) after you have not eaten for  a while (fasting). You may have this done every 1-3 years.  Abdominal aortic aneurysm (AAA) screening. You may need this if you are a current or former smoker.  Sexually transmitted disease (STD) testing. Follow these instructions at home: Eating and drinking  Eat a diet that includes fresh fruits and vegetables, whole grains, lean  protein, and low-fat dairy products. Limit your intake of foods with high amounts of sugar, saturated fats, and salt.  Take vitamin and mineral supplements as recommended by your health care provider.  Do not drink alcohol if your health care provider tells you not to drink.  If you drink alcohol: ? Limit how much you have to 0-2 drinks a day. ? Be aware of how much alcohol is in your drink. In the [place] one drink equals one 12 oz bottle of beer (355 mL), one 5 oz glass of wine (148 mL), or one 1 oz glass of hard liquor (44 mL). Lifestyle  Take daily care of your teeth and gums.  Stay active. Exercise for at least 30 minutes on 5 or more days each week.  Do not use any products that contain nicotine or tobacco, such as cigarettes, e-cigarettes, and chewing tobacco. If you need help quitting, ask your health care provider.  If you are sexually active, practice safe sex. Use a condom or other form of protection to prevent STIs (sexually transmitted infections).  Talk with your health care provider about taking a low-dose aspirin or statin. What's next?  Visit your health care provider once a year for a well check visit.  Ask your health care provider how often you should have your eyes and teeth checked.  Stay up to date on all vaccines. This information is not intended to replace advice given to you by your health care provider. Make sure you discuss any questions you have with your health care provider. Document Revised: 07/26/2018 Document Reviewed: 07/26/2018 Elsevier Patient Education  2020 Elsevier Inc.  

## 2020-02-03 NOTE — Progress Notes (Signed)
Presents today for [employer] Visit   Date of last exam: 02-12-2020  Interpreter used for this visit?  No  I connected with  [name] on 02/03/20 by a telephone  and verified that I am speaking with the correct person using two identifiers.   I discussed the limitations of evaluation and management by telemedicine. The patient expressed understanding and agreed to proceed.    Patient Care Team: [name], MD as PCP - General (Family Medicine) O'Neal, Cassie Freer, MD as PCP - Cardiology (Internal Medicine) Marilynne Halsted, MD as Referring Physician (Ophthalmology) Gerarda Fraction, MD as Referring Physician (Ophthalmology)   Other items to address today:   [place] vaccination  Scheduled an Eye exam Follow up scheduled 7-8 @ 9:20 [place] recheck   Other Screening: Last screening for diabetes: 12-31-2019   ADVANCE DIRECTIVES: Discussed:yes On File: no Materials Provided: yes  Immunization status:  Immunization History  Administered Date(s) Administered  . H1N1 08/19/2008  . Influenza Split 08/22/2012  . Influenza Whole 07/19/2007, 08/19/2008  . Influenza,inj,Quad PF,6+ Mos 05/19/2016  . Influenza,inj,quad, With Preservative 05/31/2013  . Influenza-Unspecified 08/19/2008, 08/22/2012, 05/31/2013, 05/19/2016  . Pneumococcal Conjugate-13 11/05/2013, 11/05/2013  . Pneumococcal Polysaccharide-23 07/19/2007, 03/08/2016, 03/08/2016  . Td 08/15/2001  . Tdap 08/15/2001  . Tetanus 08/15/2001     Health Maintenance Due  Topic Date Due  . COVID-19 Vaccine (1) Never done  . TETANUS/TDAP  08/16/2011  . OPHTHALMOLOGY EXAM  07/19/2019     Functional Status Survey: Is the patient deaf or have difficulty hearing?: Yes (1 month ago blew out ear drum per patient. has had hearing test loss hearing.  CT scheduled.) Does the patient have difficulty seeing, even when wearing glasses/contacts?: No Does the patient have difficulty  concentrating, remembering, or making decisions?: No Does the patient have difficulty walking or climbing stairs?: Yes Does the patient have difficulty dressing or bathing?: No Does the patient have difficulty doing errands alone such as visiting a doctor's office or shopping?: No   6CIT Screen 02/03/2020  What Year? 0 points  What month? 0 points  What time? 0 points  Count back from 20 0 points  Months in reverse 0 points  Repeat phrase 2 points  Total Score 2        Clinical Support from 02/03/2020 in [place] at [place]  AUDIT-C Score 2       Home Environment:  Lives in a one story home Yes trouble climbing stairs ( in cast due to diabetic ulcer)  Patient uses a walker No scattered  Rugs Yes  grab bars Adequate lighting / no clutter    Patient Active Problem List   Diagnosis Date Noted  . Tympanic membrane rupture 01/08/2020  . Dyslipidemia 08/19/2019  . Uncontrolled type 2 diabetes mellitus with hyperglycemia ([place])   . Hyperkalemia   . Acute diastolic HF (heart failure) ([place])   . Heart block AV complete ([place])   . Acute renal failure with acute tubular necrosis superimposed on stage 4 chronic kidney disease ([place])   . Optic atrophy of both eyes 06/13/2019  . H/O small bowel obstruction 05/17/2019  . PVD (peripheral vascular disease) ([place]) 05/14/2019  . Ulcer of left foot due to type 2 diabetes mellitus ([place])   . Skin ulcer of left foot, limited to breakdown of skin ([place])   . Charcot foot due to diabetes mellitus ([place])   . Charcot's joint of foot, left   . SBO (small bowel  obstruction) ([place]) 05/06/2019  . Complete heart block ([place]) 05/06/2019  . Moderate protein-calorie malnutrition ([place])   . Diabetic foot ulcer ([place]) 10/19/2018  . Diabetic foot infection ([place])   . Pseudophakia, both eyes 10/04/2018  . CRI (chronic renal insufficiency), stage 4 (severe) ([place]) 09/27/2018  . Adrenal insufficiency ([place]) 04/09/2015  . Peripheral edema 09/17/2014  . Shortness  of breath 09/17/2014  . Bronchospasm, acute 09/16/2014  . Partial small bowel obstruction ([place]) 09/16/2014  . Hyperglycemia   . Diabetic retinopathy associated with type 2 diabetes mellitus ([place]) 09/13/2014  . Abdominal pain 09/13/2014  . Cough 11/15/2012  . Pharyngitis 09/14/2012  . Fluid overload 09/03/2012  . Ileus following gastrointestinal surgery ([place]) 09/03/2012  . Hypokalemia 08/30/2012  . Small bowel obstruction s/p LOA [id number] 08/21/2012  . Abnormal EKG 08/21/2012  . Chest pain 08/21/2012  . Macular degeneration (senile) of retina 12/15/2011  . Lens replaced 12/15/2011  . Vitreous hemorrhage ([place]) 12/15/2011  . Essential hypertension 08/19/2008  . Regional enteritis of small intestine ([place]) 01/03/2008  . ERECTILE DYSFUNCTION, ORGANIC 01/03/2008  . Sarcoidosis 07/19/2007  . Type 2 diabetes, uncontrolled, with retinopathy ([place]) 07/19/2007  . CERUMEN IMPACTION, BILATERAL 07/19/2007  . URINARY INCONTINENCE, STRESS, MALE 08/17/2006  . PROSTATE CANCER, HX OF 08/17/2006     Past Medical History:  Diagnosis Date  . Cellulitis and abscess of face 10/11/2007   Qualifier: Diagnosis of  By: Amil Amen MD, Benjamine Mola    . Diabetic retinopathy ([place])   . Diabetic retinopathy associated with type 2 diabetes mellitus ([place]) 09/13/2014   He works for [employer]  . Fluid overload 09/03/2012   Post op  . GERD (gastroesophageal reflux disease)   . Visual impairment      Past Surgical History:  Procedure Laterality Date  . ABDOMINAL AORTOGRAM W/LOWER EXTREMITY Left 05/30/2019   Procedure: ABDOMINAL AORTOGRAM W/LOWER EXTREMITY;  Surgeon: Marty Heck, MD;  Location: [facility];  Service: Cardiovascular;  Laterality: Left;  . CATARACT EXTRACTION W/ INTRAOCULAR LENS  IMPLANT, BILATERAL    . EYE SURGERY Bilateral    "laser OR for diabetic retinopathy"  . INGUINAL HERNIA REPAIR     [name] 07/12/2010), "don't remember which side"  . LAPAROTOMY  08/23/2012    Procedure: EXPLORATORY LAPAROTOMY;  Surgeon: [name], DO;  Location: [facility];  Service: General;  Laterality: N/A;  exploratory laparotomy with lysis of adhesions  . LYSIS OF ADHESION  08/23/2012   Procedure: LYSIS OF ADHESION;  Surgeon: [name], DO;  Location: [facility];  Service: General;;  . PERIPHERAL VASCULAR BALLOON ANGIOPLASTY  05/30/2019   Procedure: PERIPHERAL VASCULAR BALLOON ANGIOPLASTY;  Surgeon: Marty Heck, MD;  Location: [facility];  Service: Cardiovascular;;  left anterior tibial  . ROBOT ASSISTED LAPAROSCOPIC RADICAL PROSTATECTOMY  2000's   "had to finish manually after machine broke"  . SHOULDER SURGERY  1970's   separation; from playing football"     Family History  Problem Relation Age of Onset  . Diabetes Mellitus II Mother   . Colon cancer Neg Hx      Social History   Socioeconomic History  . Marital status: Married    Spouse name: Not on file  . Number of children: Not on file  . Years of education: Not on file  . Highest education level: Not on file  Occupational History  . Not on file  Tobacco Use  . Smoking status: Former Smoker    Packs/day: 2.50    Years:  3.00    Pack years: 7.50    Types: Cigarettes    Quit date: 11/09/1966    Years since quitting: 53.2  . Smokeless tobacco: Never Used  Substance and Sexual Activity  . Alcohol use: Yes    Comment: 04/08/2015 "maybe a beer/ or 2 or a glass of wine monthly"  . Drug use: No  . Sexual activity: Yes  Other Topics Concern  . Not on file  Social History Narrative  . Not on file   Social Determinants of Health   Financial Resource Strain:   . Difficulty of Paying Living Expenses:   Food Insecurity:   . Worried About Charity fundraiser in the Last Year:   . Arboriculturist in the Last Year:   Transportation Needs:   . Film/video editor (Medical):   [name] Lack of Transportation (Non-Medical):   Physical Activity:   . Days of Exercise per Week:   . Minutes of Exercise  per Session:   Stress:   . Feeling of Stress :   Social Connections:   . Frequency of Communication with Friends and Family:   . Frequency of Social Gatherings with Friends and Family:   . Attends Religious Services:   . Active Member of Clubs or Organizations:   . Attends Archivist Meetings:   [name] Marital Status:   Intimate Partner Violence:   . Fear of Current or Ex-Partner:   . Emotionally Abused:   [name] Physically Abused:   . Sexually Abused:      Allergies  Allergen Reactions  . Ace Inhibitors Itching and Cough  . Naproxen Anaphylaxis, Shortness Of Breath and Other (See Comments)    Throat swells. cannot breathe, and causes GI distress     Prior to Admission medications   Medication Sig Start Date End Date Taking? Authorizing Provider  amLODipine (NORVASC) 10 MG tablet Take 1 tablet (10 mg total) by mouth daily. 12/26/19  Yes O'Neal, Cassie Freer, MD  aspirin 81 MG chewable tablet Chew 1 tablet (81 mg total) by mouth every morning. 09/25/19  Yes O'Neal, Cassie Freer, MD  atorvastatin (LIPITOR) 40 MG tablet Take 1 tablet (40 mg total) by mouth every morning. 12/26/19  Yes O'Neal, Cassie Freer, MD  blood glucose meter kit and supplies KIT Dispense based on patient and insurance preference. Use up to four times daily as directed. 09/10/19  Yes Philemon Kingdom, MD  blood glucose meter kit and supplies Dispense based on patient and insurance preference. Use up to four times daily as directed. (FOR ICD-10 E10.9, E11.9). 01/03/20  Yes Bhagat, Bhavinkumar, PA  Blood Glucose Monitoring Suppl (RELION CONFIRM GLUCOSE MONITOR) w/Device KIT Use to check blood sugar 3 times a day. 09/10/19  Yes Philemon Kingdom, MD  clopidogrel (PLAVIX) 75 MG tablet Take 1 tablet (75 mg total) by mouth daily with breakfast. 01/09/20  Yes [name], MD  Dulaglutide (TRULICITY) 7.01 XB/9.3JQ SOPN Inject 0.5 mLs (0.75 mg total) into the skin once a week. 01/16/20 04/15/20 Yes [name], MD    gabapentin (NEURONTIN) 300 MG capsule Take 1 capsule (300 mg total) by mouth 2 (two) times daily. 01/10/20  Yes [name], MD  hydrALAZINE (APRESOLINE) 25 MG tablet Take 1 tablet (25 mg total) by mouth every 8 (eight) hours. 01/09/20  Yes [name], MD  Insulin Glargine [facility]) 100 UNIT/ML Inject 0.3 mLs (30 Units total) into the skin at bedtime. 01/16/20 04/15/20 Yes [name], MD  insulin NPH-regular Human (70-30) 100 UNIT/ML injection Inject 10 Units into the skin 2 (two) times daily with a meal. 01/09/20  Yes [name], MD  Insulin Syringe-Needle U-100 (INSULIN SYRINGE .5CC/31GX5/16") 31G X 5/16" 0.5 ML MISC Use to inject insulin twice a day 01/09/20  Yes [name], MD  Lancets (FREESTYLE) lancets Use as instructed 09/18/14  Yes Delfina Redwood, MD  Lancets 30G [place]  04/06/16  Yes [provider]  Multiple Vitamins-Minerals (CENTRUM SILVER 50+MEN) TABS Take 1 tablet by mouth daily with breakfast.   Yes [provider]  ofloxacin (FLOXIN) 0.3 % OTIC solution Place 5 drops into the left ear daily. 01/07/20  Yes Horton, Barbette Hair, MD  Polyethyl Glycol-Propyl Glycol (SYSTANE OP) Place 1 drop into both eyes as needed (for dryness).    Yes [provider]  torsemide (DEMADEX) 20 MG tablet Take 1 tablet (20 mg total) by mouth daily. 01/03/20  Yes Bhagat, Bhavinkumar, PA  traZODone (DESYREL) 50 MG tablet Take 0.5-1 tablets (25-50 mg total) by mouth at bedtime as needed for sleep. 01/09/20  Yes [name], MD  Wound Dressings (MEDIHONEY WOUND/BURN DRESSING) GEL Apply to affected are 3 times a week, and cover with sterile dressing. 08/21/19  Yes Landis Martins, DPM     Depression screen [facility] 2/9 02/03/2020 01/16/2020 06/21/2019 06/21/2019 06/03/2019  Decreased Interest 0 0 0 0 0  Down, Depressed, Hopeless 0 0 1 0 0  PHQ - 2 Score 0 0 1 0 0  Some recent data might be hidden     Fall Risk  02/03/2020 01/16/2020 06/21/2019 06/03/2019  05/17/2019  Falls in the past year? 0 0 0 0 0  Number falls in past yr: 0 - 0 0 0  Injury with Fall? 0 - 0 0 0  Follow up Falls evaluation completed;Education provided Falls evaluation completed Falls evaluation completed - -      PHYSICAL EXAM: BP (!) 148/78 Comment: not in clinic  Ht '5\' 6"'  (1.676 m)   Wt 171 lb (77.6 kg)   BMI 27.60 kg/m    Wt Readings from Last 3 Encounters:  02/03/20 171 lb (77.6 kg)  01/16/20 171 lb (77.6 kg)  01/09/20 171 lb (77.6 kg)       Education/Counseling provided regarding diet and exercise, prevention of chronic diseases, smoking/tobacco cessation, if applicable, and reviewed "Covered Medicare Preventive Services."

## 2020-02-06 ENCOUNTER — Other Ambulatory Visit: Payer: Self-pay

## 2020-02-06 ENCOUNTER — Encounter (HOSPITAL_BASED_OUTPATIENT_CLINIC_OR_DEPARTMENT_OTHER): Payer: Medicare Other | Admitting: Internal Medicine

## 2020-02-06 DIAGNOSIS — E11621 Type 2 diabetes mellitus with foot ulcer: Secondary | ICD-10-CM | POA: Diagnosis not present

## 2020-02-07 ENCOUNTER — Ambulatory Visit
Admission: RE | Admit: 2020-02-07 | Discharge: 2020-02-07 | Disposition: A | Discharge status: Home / Self Care | Payer: Medicare Other | Source: Ambulatory Visit | Attending: Physician Assistant | Admitting: Physician Assistant

## 2020-02-07 DIAGNOSIS — H9120 Sudden idiopathic hearing loss, unspecified ear: Secondary | ICD-10-CM

## 2020-02-07 NOTE — Progress Notes (Signed)
[name], [name] ([id number]) Visit Report for 02/06/2020 Arrival Information Details Patient Name: Date of Service: [name] [facility] [place] L. 02/06/2020 2:30 PM Medical Record Number: [id number] Patient Account Number: [id number] Date of Birth/Sex: Treating RN: [birth year] (69 y.o. [name] Primary Care [name]: [name] Other Clinician: Referring [name]: Treating [name]/Extender: [name]: 15 Visit Information History Since Last Visit Added or deleted any medications: No Patient Arrived: Ambulatory Any new allergies or adverse reactions: No Arrival Time: 15:00 Had a fall or experienced change in No Accompanied By: alone activities of daily living that may affect Transfer Assistance: None risk of falls: Patient Identification Verified: Yes Signs or symptoms of abuse/neglect since last visito No Secondary Verification Process Completed: Yes Hospitalized since last visit: No Patient Requires Transmission-Based Precautions: No Implantable device outside of the clinic excluding No Patient Has Alerts: Yes cellular tissue based products placed in the center Patient Alerts: Patient on Blood Thinner since last visit: Has Dressing in Place as Prescribed: Yes Has Footwear/Offloading in Place as Prescribed: Yes Left: T Contact Cast otal Pain Present Now: No Electronic Signature(s) Signed: 02/07/2020 5:45:26 PM By: [name] Hurst RN, BSN Entered By: [name] on 02/06/2020 15:01:50 -------------------------------------------------------------------------------- Clinic Level of Care Assessment Details Patient Name: Date of Service: [name] [facility] [place] L. 02/06/2020 2:30 PM Medical Record Number: [id number] Patient Account Number: [id number] Date of Birth/Sex: Treating RN: 1950-10-16 (69 y.o. [name] Primary Care [name]: [name] Other Clinician: Referring [name]: Treating [name]/Extender: [name]: 15 Clinic Level of Care Assessment Items TOOL 4 Quantity Score X- 1 0 Use when only an EandM is performed on FOLLOW-UP visit ASSESSMENTS - Nursing Assessment / Reassessment X- 1 10 Reassessment of Co-morbidities (includes updates in patient status) X- 1 5 Reassessment of Adherence to Treatment Plan ASSESSMENTS - Wound and Skin A ssessment / Reassessment X - Simple Wound Assessment / Reassessment - one wound 1 5 []  - 0 Complex Wound Assessment / Reassessment - multiple wounds X- 1 10 Dermatologic / Skin Assessment (not related to wound area) ASSESSMENTS - Focused Assessment X- 1 5 Circumferential Edema Measurements - multi extremities X- 1 10 Nutritional Assessment / Counseling / Intervention []  - 0 Lower Extremity Assessment (monofilament, tuning fork, pulses) []  - 0 Peripheral Arterial Disease Assessment (using hand held doppler) ASSESSMENTS - Ostomy and/or Continence Assessment and Care []  - 0 Incontinence Assessment and Management []  - 0 Ostomy Care Assessment and Management (repouching, etc.) PROCESS - Coordination of Care X - Simple Patient / Family Education for ongoing care 1 15 []  - 0 Complex (extensive) Patient / Family Education for ongoing care X- 1 10 Staff obtains Programmer, systems, Records, T Results / Process Orders est []  - 0 Staff telephones HHA, Nursing Homes / Clarify orders / etc []  - 0 Routine Transfer to another Facility (non-emergent condition) []  - 0 Routine Hospital Admission (non-emergent condition) []  - 0 New Admissions / Biomedical engineer / Ordering NPWT Apligraf, etc. , []  - 0 Emergency Hospital Admission (emergent condition) X- 1 10 Simple Discharge Coordination []  - 0 Complex (extensive) Discharge Coordination PROCESS - Special Needs []  - 0 Pediatric / Minor Patient Management []  - 0 Isolation Patient Management []  - 0 Hearing / Language / Visual special needs []  - 0 Assessment of  Community assistance (transportation, D/C planning, etc.) []  - 0 Additional assistance / Altered mentation []  - 0 Support Surface(s) Assessment (bed, cushion, seat, etc.) INTERVENTIONS - Wound  Cleansing / Measurement X - Simple Wound Cleansing - one wound 1 5 []  - 0 Complex Wound Cleansing - multiple wounds X- 1 5 Wound Imaging (photographs - any number of wounds) []  - 0 Wound Tracing (instead of photographs) X- 1 5 Simple Wound Measurement - one wound []  - 0 Complex Wound Measurement - multiple wounds INTERVENTIONS - Wound Dressings X - Small Wound Dressing one or multiple wounds 1 10 []  - 0 Medium Wound Dressing one or multiple wounds []  - 0 Large Wound Dressing one or multiple wounds []  - 0 Application of Medications - topical []  - 0 Application of Medications - injection INTERVENTIONS - Miscellaneous []  - 0 External ear exam []  - 0 Specimen Collection (cultures, biopsies, blood, body fluids, etc.) []  - 0 Specimen(s) / Culture(s) sent or taken to Lab for analysis []  - 0 Patient Transfer (multiple staff / Civil Service fast streamer / Similar devices) []  - 0 Simple Staple / Suture removal (25 or less) []  - 0 Complex Staple / Suture removal (26 or more) []  - 0 Hypo / Hyperglycemic Management (close monitor of Blood Glucose) []  - 0 Ankle / Brachial Index (ABI) - do not check if billed separately X- 1 5 Vital Signs Has the patient been seen at the hospital within the last three years: Yes Total Score: 110 Level Of Care: New/Established - Level 3 Electronic Signature(s) Signed: 02/06/2020 5:34:37 PM By: [name] Entered By: [name] on 02/06/2020 15:29:25 -------------------------------------------------------------------------------- Encounter Discharge Information Details Patient Name: Date of Service: [name] [place] L. 02/06/2020 2:30 PM Medical Record Number: [id number] Patient Account Number: [id number] Date of Birth/Sex: Treating RN: 1950/09/24 (69 y.o. [name] Primary Care [name]: [name] Other Clinician: Referring [name]: Treating [name]/Extender: [name]: 15 Encounter Discharge Information Items Discharge Condition: Stable Ambulatory Status: Ambulatory Discharge Destination: Home Transportation: Private Auto Accompanied By: self Schedule Follow-up Appointment: No Clinical Summary of Care: Notes educated patient to follow up next week with scheduled appointment time with podiatry related to special insert. padded left foot with bordered foam. Patient in agreement. Electronic Signature(s) Signed: 02/06/2020 5:34:37 PM By: [name] Entered By: [name] on 02/06/2020 15:37:19 -------------------------------------------------------------------------------- Lower Extremity Assessment Details Patient Name: Date of Service: [name] [name] [place] L. 02/06/2020 2:30 PM Medical Record Number: [id number] Patient Account Number: [id number] Date of Birth/Sex: Treating RN: Nov 26, 1950 (69 y.o. [name] Primary Care Trinidad Petron: [name] Other Clinician: Referring Coral Timme: Treating Merrisa Skorupski/Extender: [name]: 15 Edema Assessment Assessed: Shirlyn Goltz: No] Patrice Paradise: No] Edema: [Left: N] [Right: o] Calf Left: Right: Point of Measurement: 41 cm From Medial Instep 35 cm cm Ankle Left: Right: Point of Measurement: 9 cm From Medial Instep 22 cm cm Vascular Assessment Pulses: Dorsalis Pedis Palpable: [Left:Yes] Electronic Signature(s) Signed: 02/07/2020 5:45:26 PM By: [name] Hurst RN, BSN Entered By: [name] on 02/06/2020 15:12:38 -------------------------------------------------------------------------------- Multi Wound Chart Details Patient Name: Date of Service: [name] [place] L. 02/06/2020 2:30 PM Medical Record Number: [id number] Patient Account Number: [id number] Date of Birth/Sex: Treating RN: 11-Jun-1951  (69 y.o. [name] Primary Care Marton Malizia: [name] Other Clinician: Referring Ervine Witucki: Treating Troye Hiemstra/Extender: [name]: 15 Vital Signs Height(in): 67 Capillary Blood Glucose(mg/dl): 123 Weight(lbs): 173 Pulse(bpm): 71 Body Mass Index(BMI): 27 Blood Pressure(mmHg): 167/67 Temperature(F): 98.7 Respiratory Rate(breaths/min): 18 Photos: [3:No Photos Left, Plantar Foot] [N/A:N/A N/A] Wound Location: [3:Gradually Appeared] [N/A:N/A] Wounding Event: [3:Diabetic Wound/Ulcer of  the Lower] [N/A:N/A] Primary Etiology: [3:Extremity Congestive Heart Failure,] [N/A:N/A] Comorbid History: [3:Hypertension, Peripheral Venous Disease, Type II Diabetes 12/23/2019] [N/A:N/A] Date Acquired: [3:4] [N/A:N/A] Weeks of Treatment: [3:Healed - Epithelialized] [N/A:N/A] Wound Status: [3:0x0x0] [N/A:N/A] Measurements L x W x D (cm) [3:0] [N/A:N/A] A (cm) : rea [3:0] [N/A:N/A] Volume (cm) : [3:100.00%] [N/A:N/A] % Reduction in A rea: [3:100.00%] [N/A:N/A] % Reduction in Volume: [3:Grade 2] [N/A:N/A] Classification: [3:None Present] [N/A:N/A] Exudate A mount: [3:Distinct, outline attached] [N/A:N/A] Wound Margin: [3:None Present (0%)] [N/A:N/A] Granulation A mount: [3:Fascia: No] [N/A:N/A] Exposed Structures: [3:Fat Layer (Subcutaneous Tissue) Exposed: No Tendon: No Muscle: No Joint: No Bone: No Large (67-100%)] [N/A:N/A] Treatment Notes Electronic Signature(s) Signed: 02/06/2020 5:34:37 PM By: [name] Signed: 02/07/2020 5:27:17 PM By: Linton Ham MD Entered By: Linton Ham on 02/06/2020 15:34:03 -------------------------------------------------------------------------------- Multi-Disciplinary Care Plan Details Patient Name: Date of Service: [name] [place] L. 02/06/2020 2:30 PM Medical Record Number: [id number] Patient Account Number: [id number] Date of Birth/Sex: Treating RN: April 21, 1951 (69 y.o. [name] Primary  Care Seraphim Trow: [name] Other Clinician: Referring Janye Maynor: Treating Aniya Jolicoeur/Extender: [name]: 15 Active Inactive Electronic Signature(s) Signed: 02/06/2020 5:34:37 PM By: [name] Entered By: [name] on 02/06/2020 15:29:04 -------------------------------------------------------------------------------- Pain Assessment Details Patient Name: Date of Service: [name] [place] L. 02/06/2020 2:30 PM Medical Record Number: [id number] Patient Account Number: [id number] Date of Birth/Sex: Treating RN: 1951/07/17 (69 y.o. [name] Primary Care Keylani Perlstein: [name] Other Clinician: Referring Kamil Mchaffie: Treating Jonice Cerra/Extender: [name]: 15 Active Problems Location of Pain Severity and Description of Pain Patient Has Paino No Site Locations Pain Management and Medication Current Pain Management: Electronic Signature(s) Signed: 02/07/2020 5:45:26 PM By: [name] Hurst RN, BSN Entered By: [name] on 02/06/2020 15:02:27 -------------------------------------------------------------------------------- Patient/Caregiver Education Details Patient Name: Date of Service: [name] [place] L. 6/24/2021andnbsp2:30 PM Medical Record Number: [id number] Patient Account Number: [id number] Date of Birth/Gender: Treating RN: Jan 08, 1951 (69 y.o. [name] Primary Care Physician: [name] Other Clinician: Referring Physician: Treating Physician/Extender: [name]: 15 Education Assessment Education Provided To: Patient Education Topics Provided Wound/Skin Impairment: Handouts: Caring for Your Ulcer Methods: Explain/Verbal Responses: Reinforcements needed Electronic Signature(s) Signed: 02/06/2020 5:34:37 PM By: [name] Entered By: [name] on 02/06/2020  14:53:23 -------------------------------------------------------------------------------- Wound Assessment Details Patient Name: Date of Service: [name] [facility] [place] L. 02/06/2020 2:30 PM Medical Record Number: [id number] Patient Account Number: [id number] Date of Birth/Sex: Treating RN: Jul 09, 1951 (69 y.o. [name] Primary Care Markez Dowland: [name] Other Clinician: Referring Shekita Boyden: Treating Arseniy Toomey/Extender: [name]: 15 Wound Status Wound Number: 3 Primary Diabetic Wound/Ulcer of the Lower Extremity Etiology: Wound Location: Left, Plantar Foot Wound Healed - Epithelialized Wounding Event: Gradually Appeared Status: Date Acquired: 12/23/2019 Comorbid Congestive Heart Failure, Hypertension, Peripheral Venous Weeks Of Treatment: 4 History: Disease, Type II Diabetes Clustered Wound: No Wound Measurements Length: (cm) Width: (cm) Depth: (cm) Area: (cm) Volume: (cm) 0 % Reduction in Area: 100% 0 % Reduction in Volume: 100% 0 Epithelialization: Large (67-100%) 0 Tunneling: No 0 Undermining: No Wound Description Classification: Grade 2 Wound Margin: Distinct, outline attached Exudate Amount: None Present Foul Odor After Cleansing: No Slough/Fibrino No Wound Bed Granulation Amount: None Present (0%) Exposed Structure Fascia Exposed: No Fat Layer (Subcutaneous Tissue) Exposed: No Tendon Exposed: No Muscle Exposed: No Joint Exposed: No Bone Exposed: No Electronic Signature(s) Signed: 02/07/2020 5:45:26 PM  By: [name] Hurst RN, BSN Entered By: [name] on 02/06/2020 15:12:55 -------------------------------------------------------------------------------- Vitals Details Patient Name: Date of Service: [name] [facility] [place] L. 02/06/2020 2:30 PM Medical Record Number: [id number] Patient Account Number: [id number] Date of Birth/Sex: Treating RN: 12-25-1950 (69 y.o. [name] Primary Care Meron Bocchino:  [name] Other Clinician: Referring Awesome Jared: Treating Jaque Dacy/Extender: [name]: 15 Vital Signs Time Taken: 15:00 Temperature (F): 98.7 Height (in): 67 Pulse (bpm): 71 Weight (lbs): 173 Respiratory Rate (breaths/min): 18 Body Mass Index (BMI): 27.1 Blood Pressure (mmHg): 167/67 Capillary Blood Glucose (mg/dl): 123 Reference Range: 80 - 120 mg / dl Notes glucose per pt report Electronic Signature(s) Signed: 02/07/2020 5:45:26 PM By: [name] Hurst RN, BSN Entered By: [name] on 02/06/2020 15:02:22

## 2020-02-07 NOTE — Progress Notes (Signed)
[place], [place] ([id number]) Visit Report for 02/06/2020 HPI Details Patient Name: Date of Service: [name] [facility] [place] L. 02/06/2020 2:30 PM Medical Record Number: [id number] Patient Account Number: [id number] Date of Birth/Sex: Treating RN: [birth year] (69 y.o. [name] Primary Care Provider: Grant [name] Other Clinician: Referring Provider: Treating Provider/Extender: [name] in Treatment: 15 History of Present Illness HPI Description: ADMISSION 10/18/2019 Patient is a 69 year old man with type 2 diabetes peripheral neuropathy and a Charcot foot. The problem I think began in March 2020 just about a year ago when he was admitted to hospital from 10/19/2018 through 10/24/2018 with a wound on his left lateral foot intense cellulitis. MRI did not suggest osteomyelitis but did note a draining sinus. He apparently received a prolonged course of antibiotics. His hemoglobin A1c at that time was 13.9. He was referred to Dr. Sharol [name] where he appears to been followed for probably 5 or 6 months. He was subsequently referred himself to Dr. Cannon [name] of podiatry. He has been offloaded in a surgical shoe with a Pegasys sole. He has been mostly using Medihoney and episodic debridement of intense surrounding callus. He has been referred here for further management of this difficult the heel wound area and consideration of hyperbaric oxygen The patient had an angiogram in November. He did not have any proximal stenoses but he did have a 70 to 80% peroneal artery occlusion as well as an 80 to 95% stenosis of the anterior tibia which underwent an angioplasty. Follow-up ABIs were done on 07/02/2019 showed an ABI on the right of 1.26 on the left at 1.28 with TBI's of 0.65 and 0.67 respectively. Waveforms were triphasic Past medical history includes type 2 diabetes with neuropathy, PAD and retinopathy, prostate cancer, gastroesophageal reflux disease, chronic kidney disease, small bowel  obstruction 10/25/2019. Patient's x-ray of the left foot from last time showed no evidence of osteomyelitis. Chronic Charcot/neuropathic changes as expected. Notable soft tissue ulcer involving the plantar aspect underlying the forefoot. The patient actually has 2 wounds. One on the mid forefoot limited to breakdown of skin. The larger area is actually on the lateral foot. We have been using silver alginate to both wound areas. 3/15; patient came back for the obligatory first total contact cast change. He was apparently walking on the cast without the boot but otherwise tolerated it well and per our intake nurse the wounds look satisfactory. He will be back later in the week for his wound care visit 3/18; patient's wound on the plantar foot looks smaller of lateral foot about the same. Both required debridement. There was some macerated tissue between the 2 wounds which were also removed. We have been using silver alginate. 3/26 1 week follow-up. T contact cast. Plantar foot wound has closed over. The area on the lateral part of his foot is still open but smaller. Using silver otal alginate on the wounds 11/15/2019 upon evaluation today patient appears to be doing well with regard to his wound. He is making progress and fortunately the wound is measuring smaller and looking better at this point. Fortunately there is no signs of active infection which is also good news we been using a total contact cast up to this point. 4/9; small wound now on the left lateral foot not technically on the plantar surface although I think he may be somebody who walks partially on the outside of his foot. The more extensive wound on the plantar foot healed 2 weeks ago. The patient tells  me that his mother is recently passed away. He wants the total contact cast off Week in preparation for a funeral in [place]. We'll bring him back in on Wednesday to try and accommodate this. We also changed the [facility]  might give [place] a quick chance to have a look at this on that date 4/13; patient is here for a total contact cast change. The wound looks quite healthy we reapplied Hydrofera Blue. He will be back next week for review 4/20; the patient's wound is slightly smaller. The area on the plantar foot remains healed. We have been using Hydrofera Blue 4/27 patient's wound is about half the size. Using Hydrofera Blue the plantar foot remains healed. Blood pressure very high today 5/4; the patient's wound is totally healed. Type 2 diabetes with a Charcot foot. Healed out in a total contact cast. He has a diabetic shoe with custom inserts that he got at [facility]. 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 -------------------------------------------------------------------------------------- [place] 01/09/20 -Patient's wound has opened up again on his left plantar foot with extension of hematoma to the left lateral edge. Patient denies any fevers or chills. It appears that he has been wearing regular shoes although he states most of the time he is using his custom fitted boot that he has had for 7 months 6/3; I am disturbed to see that the patient had to be readmitted to the clinic last week 3 weeks after we had healed him out with a total contact cast. He had diabetic shoes with custom inserts although he tells me he was only wearing them half the time. He has been to see [name] who is the foot where orthotist [facility] and he is recommending a bubble pocket offload which I am really fine with but I emphasized to the patient that he needs to wear these ALLTHE TIME. Suggested Bubble pocket off load MEZZ0 6/10; patient is in a total contact cast for a wound on the left midfoot. Dimensions are smaller. Wound surface is still friable. We are  using Hydrofera Blue under the total contact cast 6/17; only a very small open area remains. The surface looks clean. Using Hydrofera Blue under the total contact cast. This very well could be closed again next week. He is always said that he has a scheduled appointment with [employer] in [place]. I have asked him to make this for next week under the premise that this actually may heal. It if is not healed then I will probably give him an offloading shoe bring him back to reapply the cast but at least [name] the opportunity to go to see about custom-made shoes. He has severe neuropathy Charcot deformity secondary diabetes 6/24; the patient's area is once again healed. He tells me he has an appointment with [name] at [place] for sometime next week. He has diabetic shoes with custom inserts. Electronic Signature(s) Signed: 02/07/2020 5:27:17 PM By: Linton Ham MD Entered By: Linton Ham on 02/06/2020 15:36:13 -------------------------------------------------------------------------------- Physical Exam Details Patient Name: Date of Service: [name] L. 02/06/2020 2:30 PM Medical Record Number: [id number] Patient Account Number: [id number] Date of Birth/Sex: Treating RN: 10/16/1950 (69 y.o. [name] Primary Care Provider: Grant [name] Other Clinician: Referring Provider: Treating Provider/Extender: Linton Ham  [name] Weeks in Treatment: 15 Constitutional Patient is hypertensive.. Pulse regular and within target range for patient.[name] Respirations regular, non-labored and within target range.. Temperature is normal and within the target range for the patient.[name] Appears in no distress. Notes Wound exam; left midfoot. This is fully epithelialized. There is no open area. Electronic Signature(s) Signed: 02/07/2020 5:27:17 PM By: Linton Ham MD Entered By: Linton Ham on 02/06/2020  15:36:54 -------------------------------------------------------------------------------- Physician Orders Details Patient Name: Date of Service: [name] ND L. 02/06/2020 2:30 PM Medical Record Number: [id number] Patient Account Number: [id number] Date of Birth/Sex: Treating RN: July 28, 1951 (69 y.o. [name] Primary Care Provider: Grant [name] Other Clinician: Referring Provider: Treating Provider/Extender: [name] in Treatment: 15 Verbal / Phone Orders: No Diagnosis Coding ICD-10 Coding Code Description [phone] Non-pressure chronic ulcer of other part of left foot with fat layer exposed E11.51 Type 2 diabetes mellitus with diabetic peripheral angiopathy without gangrene E11.621 Type 2 diabetes mellitus with foot ulcer L97.521 Non-pressure chronic ulcer of other part of left foot limited to breakdown of skin M14.672 Charcot's joint, left ankle and foot E11.40 Type 2 diabetes mellitus with diabetic neuropathy, unspecified Discharge From [facility] Services Discharge from [place] - Call if any future wound care needs. Patient to follow up with Podiatrist related to [facility] bubble pocket offloading insert. Pad left foot for protection. Electronic Signature(s) Signed: 02/06/2020 5:34:37 PM By: Deon Pilling Signed: 02/07/2020 5:27:17 PM By: Linton Ham MD Entered By: Deon Pilling on 02/06/2020 15:28:39 -------------------------------------------------------------------------------- Problem List Details Patient Name: Date of Service: [name] ND L. 02/06/2020 2:30 PM Medical Record Number: [id number] Patient Account Number: [id number] Date of Birth/Sex: Treating RN: Feb 18, 1951 (69 y.o. [name] Primary Care Provider: Grant [name] Other Clinician: Referring Provider: Treating Provider/Extender: [name] in Treatment: 15 Active Problems ICD-10 Encounter Code Description Active  Date MDM Diagnosis [phone] Non-pressure chronic ulcer of other part of left foot with fat layer exposed 10/18/2019 No Yes E11.51 Type 2 diabetes mellitus with diabetic peripheral angiopathy without gangrene 10/18/2019 No Yes E11.621 Type 2 diabetes mellitus with foot ulcer 10/18/2019 No Yes L97.521 Non-pressure chronic ulcer of other part of left foot limited to breakdown of 10/25/2019 No Yes skin M14.672 Charcot's joint, left ankle and foot 10/18/2019 No Yes E11.40 Type 2 diabetes mellitus with diabetic neuropathy, unspecified 10/18/2019 No Yes Inactive Problems Resolved Problems Electronic Signature(s) Signed: 02/07/2020 5:27:17 PM By: Linton Ham MD Entered By: Linton Ham on 02/06/2020 15:33:54 -------------------------------------------------------------------------------- Progress Note Details Patient Name: Date of Service: [name] ND L. 02/06/2020 2:30 PM Medical Record Number: [id number] Patient Account Number: [id number] Date of Birth/Sex: Treating RN: 07/17/1951 (69 y.o. [name] Primary Care Provider: Grant [name] Other Clinician: Referring Provider: Treating Provider/Extender: [name] in Treatment: 15 Subjective History of Present Illness (HPI) ADMISSION 10/18/2019 Patient is a 69 year old man with type 2 diabetes peripheral neuropathy and a Charcot foot. The problem I think began in March 2020 just about a year ago when he was admitted to hospital from 10/19/2018 through 10/24/2018 with a wound on his left lateral foot intense cellulitis. MRI did not suggest osteomyelitis but did note a draining sinus. He apparently received a prolonged course of antibiotics. His hemoglobin A1c at that time was 13.9. He was referred to Dr. Sharol [name] where he appears to been followed for probably 5 or 6 months. He was subsequently referred himself to Dr. Cannon [name] of podiatry.  He has been offloaded in a surgical shoe with a Pegasys sole. He has been mostly  using Medihoney and episodic debridement of intense surrounding callus. He has been referred here for further management of this difficult the heel wound area and consideration of hyperbaric oxygen The patient had an angiogram in November. He did not have any proximal stenoses but he did have a 70 to 80% peroneal artery occlusion as well as an 80 to 95% stenosis of the anterior tibia which underwent an angioplasty. Follow-up ABIs were done on 07/02/2019 showed an ABI on the right of 1.26 on the left at 1.28 with TBI's of 0.65 and 0.67 respectively. Waveforms were triphasic Past medical history includes type 2 diabetes with neuropathy, PAD and retinopathy, prostate cancer, gastroesophageal reflux disease, chronic kidney disease, small bowel obstruction 10/25/2019. Patient's x-ray of the left foot from last time showed no evidence of osteomyelitis. Chronic Charcot/neuropathic changes as expected. Notable soft tissue ulcer involving the plantar aspect underlying the forefoot. The patient actually has 2 wounds. One on the mid forefoot limited to breakdown of skin. The larger area is actually on the lateral foot. We have been using silver alginate to both wound areas. 3/15; patient came back for the obligatory first total contact cast change. He was apparently walking on the cast without the boot but otherwise tolerated it well and per our intake nurse the wounds look satisfactory. He will be back later in the week for his wound care visit 3/18; patient's wound on the plantar foot looks smaller of lateral foot about the same. Both required debridement. There was some macerated tissue between the 2 wounds which were also removed. We have been using silver alginate. 3/26 1 week follow-up. T contact cast. Plantar foot wound has closed over. The area on the lateral part of his foot is still open but smaller. Using silver otal alginate on the wounds 11/15/2019 upon evaluation today patient appears to be doing  well with regard to his wound. He is making progress and fortunately the wound is measuring smaller and looking better at this point. Fortunately there is no signs of active infection which is also good news we been using a total contact cast up to this point. 4/9; small wound now on the left lateral foot not technically on the plantar surface although I think he may be somebody who walks partially on the outside of his foot. The more extensive wound on the plantar foot healed 2 weeks ago. The patient tells me that his mother is recently passed away. He wants the total contact cast off Week in preparation for a funeral in [place]. We'll bring him back in on Wednesday to try and accommodate this. We also changed the [facility] might give [place] a quick chance to have a look at this on that date 4/13; patient is here for a total contact cast change. The wound looks quite healthy we reapplied Hydrofera Blue. He will be back next week for review 4/20; the patient's wound is slightly smaller. The area on the plantar foot remains healed. We have been using Hydrofera Blue 4/27 patient's wound is about half the size. Using Hydrofera Blue the plantar foot remains healed. Blood pressure very high today 5/4; the patient's wound is totally healed. Type 2 diabetes with a Charcot foot. Healed out in a total contact cast. He has a diabetic shoe with custom inserts that he got at [facility]. The patient works on a machine he wants to  be able to return to work at this point I do not know that there is anything I can tell him other than to keep this area protected in his diabetic shoe. -------------------------------------------------------------------------------------- [place]MISS[place] 01/09/20 -Patient's wound has opened up again on his left plantar foot with extension of hematoma to the left lateral edge. Patient denies any fevers or chills. It appears that he has been wearing regular shoes although  he states most of the time he is using his custom fitted boot that he has had for 7 months 6/3; I am disturbed to see that the patient had to be readmitted to the clinic last week 3 weeks after we had healed him out with a total contact cast. He had diabetic shoes with custom inserts although he tells me he was only wearing them half the time. He has been to see [name] who is the foot where orthotist [facility] and he is recommending a bubble pocket offload which I am really fine with but I emphasized to the patient that he needs to wear these ALLTHE TIME. Suggested Bubble pocket off load MEZZ0 6/10; patient is in a total contact cast for a wound on the left midfoot. Dimensions are smaller. Wound surface is still friable. We are using Hydrofera Blue under the total contact cast 6/17; only a very small open area remains. The surface looks clean. Using Hydrofera Blue under the total contact cast. This very well could be closed again next week. He is always said that he has a scheduled appointment with [employer] in [place]. I have asked him to make this for next week under the premise that this actually may heal. It if is not healed then I will probably give him an offloading shoe bring him back to reapply the cast but at least [name] the opportunity to go to see about custom-made shoes. He has severe neuropathy Charcot deformity secondary diabetes 6/24; the patient's area is once again healed. He tells me he has an appointment with [name] at [place] for sometime next week. He has diabetic shoes with custom inserts. Objective Constitutional Patient is hypertensive.. Pulse regular and within target range for patient.[name] Respirations regular, non-labored and within target range.. Temperature is normal and within the target range for the patient.[name] Appears in no distress. Vitals Time Taken: 3:00 PM, Height: 67 in, Weight: 173 lbs, BMI: 27.1, Temperature: 98.7 F, Pulse: 71 bpm, Respiratory Rate:  18 breaths/min, Blood Pressure: 167/67 mmHg, Capillary Blood Glucose: 123 mg/dl. General Notes: glucose per pt report General Notes: Wound exam; left midfoot. This is fully epithelialized. There is no open area. Integumentary (Hair, Skin) Wound #3 status is Healed - Epithelialized. Original cause of wound was Gradually Appeared. The wound is located on the [place]. The wound measures 0cm length x 0cm width x 0cm depth; 0cm^2 area and 0cm^3 volume. There is no tunneling or undermining noted. There is a none present amount of drainage noted. The wound margin is distinct with the outline attached to the wound base. There is no granulation within the wound bed. Assessment Active Problems ICD-10 Non-pressure chronic ulcer of other part of left foot with fat layer exposed Type 2 diabetes mellitus with diabetic peripheral angiopathy without gangrene Type 2 diabetes mellitus with foot ulcer Non-pressure chronic ulcer of other part of left foot limited to breakdown of skin Charcot's joint, left ankle and foot Type 2 diabetes mellitus with diabetic neuropathy, unspecified Plan Discharge From [facility] Services: Discharge from [place] -  Call if any future wound care needs. Patient to follow up with Podiatrist related to [facility] bubble pocket offloading insert. Pad left foot for protection. 1. We put border foam over the wound area and told the patient he would need to do something similar even in his current diabetic shoes 2. He sees Higher education careers adviser at [employer] next week for discussion about altering the diabetic shoes. 3. We have been down this road before with this patient hopefully this time we can maintain skin integrity. He does heal slowly with a total contact cast but we have not been able to transition this into of patient footwear Electronic Signature(s) Signed: 02/07/2020 5:27:17 PM By: Linton Ham MD Entered By: Linton Ham on 02/06/2020  15:37:52 -------------------------------------------------------------------------------- SuperBill Details Patient Name: Date of Service: [name] L. 02/06/2020 Medical Record Number: [id number] Patient Account Number: [id number] Date of Birth/Sex: Treating RN: 1951/05/27 (69 y.o. [name] Primary Care Provider: Grant [name] Other Clinician: Referring Provider: Treating Provider/Extender: [name] in Treatment: 15 Diagnosis Coding ICD-10 Codes Code Description [phone] Non-pressure chronic ulcer of other part of left foot with fat layer exposed E11.51 Type 2 diabetes mellitus with diabetic peripheral angiopathy without gangrene E11.621 Type 2 diabetes mellitus with foot ulcer L97.521 Non-pressure chronic ulcer of other part of left foot limited to breakdown of skin M14.672 Charcot's joint, left ankle and foot E11.40 Type 2 diabetes mellitus with diabetic neuropathy, unspecified Facility Procedures CPT4 Code: [id number] Description: [place] VISIT-LEV 3 EST PT Modifier: Quantity: 1 Physician Procedures Electronic Signature(s) Signed: 02/07/2020 5:27:17 PM By: Linton Ham MD Entered By: Linton Ham on 02/06/2020 15:38:12

## 2020-02-10 ENCOUNTER — Ambulatory Visit: Payer: Medicare Other | Admitting: Orthotics

## 2020-02-10 ENCOUNTER — Other Ambulatory Visit: Payer: Self-pay | Admitting: Physician Assistant

## 2020-02-10 ENCOUNTER — Other Ambulatory Visit: Payer: Self-pay

## 2020-02-10 DIAGNOSIS — L089 Local infection of the skin and subcutaneous tissue, unspecified: Secondary | ICD-10-CM

## 2020-02-10 DIAGNOSIS — E08621 Diabetes mellitus due to underlying condition with foot ulcer: Secondary | ICD-10-CM

## 2020-02-10 DIAGNOSIS — E11610 Type 2 diabetes mellitus with diabetic neuropathic arthropathy: Secondary | ICD-10-CM

## 2020-02-10 DIAGNOSIS — I739 Peripheral vascular disease, unspecified: Secondary | ICD-10-CM

## 2020-02-10 DIAGNOSIS — L84 Corns and callosities: Secondary | ICD-10-CM

## 2020-02-10 DIAGNOSIS — H9120 Sudden idiopathic hearing loss, unspecified ear: Secondary | ICD-10-CM

## 2020-02-10 DIAGNOSIS — L97401 Non-pressure chronic ulcer of unspecified heel and midfoot limited to breakdown of skin: Secondary | ICD-10-CM

## 2020-02-10 NOTE — Progress Notes (Signed)
Saw patient today for casting of CROW walker; previously we discussed the benefits/challenges using the CROW walker.  Patient wanted to know if there were any alternatives; I told him that I would discuss w. Dr. [name] the possibility of dispensing a Mezzo style brace with offload built in (bubble).  Waitng on direction from Dr. Chauncey Cruel.

## 2020-02-12 ENCOUNTER — Other Ambulatory Visit: Payer: Self-pay

## 2020-02-12 ENCOUNTER — Ambulatory Visit (INDEPENDENT_AMBULATORY_CARE_PROVIDER_SITE_OTHER): Payer: Medicare Other | Admitting: Sports Medicine

## 2020-02-12 ENCOUNTER — Encounter: Payer: Self-pay | Admitting: Sports Medicine

## 2020-02-12 DIAGNOSIS — L84 Corns and callosities: Secondary | ICD-10-CM

## 2020-02-12 DIAGNOSIS — E1142 Type 2 diabetes mellitus with diabetic polyneuropathy: Secondary | ICD-10-CM

## 2020-02-12 DIAGNOSIS — B351 Tinea unguium: Secondary | ICD-10-CM

## 2020-02-12 DIAGNOSIS — I739 Peripheral vascular disease, unspecified: Secondary | ICD-10-CM | POA: Diagnosis not present

## 2020-02-12 DIAGNOSIS — E11610 Type 2 diabetes mellitus with diabetic neuropathic arthropathy: Secondary | ICD-10-CM | POA: Diagnosis not present

## 2020-02-12 DIAGNOSIS — M79675 Pain in left toe(s): Secondary | ICD-10-CM

## 2020-02-12 DIAGNOSIS — M79674 Pain in right toe(s): Secondary | ICD-10-CM

## 2020-02-12 NOTE — Progress Notes (Signed)
Subjective: [name] is a 69 y.o. male patient seen in office for follow-up evaluation of ulceration of the left foot.  Patient reports that the wound center removed 2nd cast and he has been healed still at little soreness at lateral foot but denies warmth or drainage from plantar foot. Reports that he also want his nails trimmed.  Patient has no other pedal complaints at this time.  Last A1c 13.0 but thinks it should be less   FBS 123  Patient Active Problem List   Diagnosis Date Noted  . Tympanic membrane rupture 01/08/2020  . Dyslipidemia 08/19/2019  . Uncontrolled type 2 diabetes mellitus with hyperglycemia ([place])   . Hyperkalemia   . Acute diastolic HF (heart failure) ([name])   . Heart block AV complete ([name])   . Acute renal failure with acute tubular necrosis superimposed on stage 4 chronic kidney disease ([name])   . Optic atrophy of both eyes 06/13/2019  . H/O small bowel obstruction 05/17/2019  . PVD (peripheral vascular disease) ([name]) 05/14/2019  . Ulcer of left foot due to type 2 diabetes mellitus ([name])   . Skin ulcer of left foot, limited to breakdown of skin ([name])   . Charcot foot due to diabetes mellitus ([name])   . Charcot's joint of foot, left   . SBO (small bowel obstruction) ([name]) 05/06/2019  . Complete heart block ([name]) 05/06/2019  . Moderate protein-calorie malnutrition ([name])   . Diabetic foot ulcer ([name]) 10/19/2018  . Diabetic foot infection ([name])   . Pseudophakia, both eyes 10/04/2018  . CRI (chronic renal insufficiency), stage 4 (severe) ([name]) 09/27/2018  . Adrenal insufficiency ([name]) 04/09/2015  . Peripheral edema 09/17/2014  . Shortness of breath 09/17/2014  . Bronchospasm, acute 09/16/2014  . Partial small bowel obstruction ([name]) 09/16/2014  . Hyperglycemia   . Diabetic retinopathy associated with type 2 diabetes mellitus ([name]) 09/13/2014  . Abdominal pain 09/13/2014  . Cough 11/15/2012  . Pharyngitis 09/14/2012  . Fluid overload 09/03/2012  .  Ileus following gastrointestinal surgery ([name]) 09/03/2012  . Hypokalemia 08/30/2012  . Small bowel obstruction s/p LOA [id number] 08/21/2012  . Abnormal EKG 08/21/2012  . Chest pain 08/21/2012  . Macular degeneration (senile) of retina 12/15/2011  . Lens replaced 12/15/2011  . Vitreous hemorrhage ([name]) 12/15/2011  . Essential hypertension 08/19/2008  . Regional enteritis of small intestine ([name]) 01/03/2008  . ERECTILE DYSFUNCTION, ORGANIC 01/03/2008  . Sarcoidosis 07/19/2007  . Type 2 diabetes, uncontrolled, with retinopathy ([name]) 07/19/2007  . CERUMEN IMPACTION, BILATERAL 07/19/2007  . URINARY INCONTINENCE, STRESS, MALE 08/17/2006  . PROSTATE CANCER, HX OF 08/17/2006   Current Outpatient Medications on File Prior to Visit  Medication Sig Dispense Refill  . amLODipine (NORVASC) 10 MG tablet Take 1 tablet (10 mg total) by mouth daily. 90 tablet 3  . aspirin 81 MG chewable tablet Chew 1 tablet (81 mg total) by mouth every morning. 90 tablet 3  . atorvastatin (LIPITOR) 40 MG tablet Take 1 tablet (40 mg total) by mouth every morning. 90 tablet 1  . blood glucose meter kit and supplies KIT Dispense based on patient and insurance preference. Use up to four times daily as directed. 1 each 1  . blood glucose meter kit and supplies Dispense based on patient and insurance preference. Use up to four times daily as directed. (FOR ICD-10 E10.9, E11.9). 1 each 0  . Blood Glucose Monitoring Suppl (RELION CONFIRM GLUCOSE MONITOR) w/Device KIT Use to check blood sugar 3 times a day. 1 kit 0  .  clopidogrel (PLAVIX) 75 MG tablet Take 1 tablet (75 mg total) by mouth daily with breakfast. 30 tablet 3  . Dulaglutide (TRULICITY) 5.63 OV/5.6EP SOPN Inject 0.5 mLs (0.75 mg total) into the skin once a week. 6 mL 0  . gabapentin (NEURONTIN) 300 MG capsule Take 1 capsule (300 mg total) by mouth 2 (two) times daily. 60 capsule 5  . hydrALAZINE (APRESOLINE) 25 MG tablet Take 1 tablet (25 mg total) by mouth every 8  (eight) hours. 90 tablet 6  . Insulin Glargine (BASAGLAR KWIKPEN) 100 UNIT/ML Inject 0.3 mLs (30 Units total) into the skin at bedtime. 27 mL 0  . insulin NPH-regular Human (70-30) 100 UNIT/ML injection Inject 10 Units into the skin 2 (two) times daily with a meal. 10 mL 11  . Insulin Syringe-Needle U-100 (INSULIN SYRINGE .5CC/31GX5/16") 31G X 5/16" 0.5 ML MISC Use to inject insulin twice a day 100 each 3  . Lancets (FREESTYLE) lancets Use as instructed 100 each 0  . Lancets 30G MISC     . Multiple Vitamins-Minerals (CENTRUM SILVER 50+MEN) TABS Take 1 tablet by mouth daily with breakfast.    . ofloxacin (FLOXIN) 0.3 % OTIC solution Place 5 drops into the left ear daily. 5 mL 0  . Polyethyl Glycol-Propyl Glycol (SYSTANE OP) Place 1 drop into both eyes as needed (for dryness).     . torsemide (DEMADEX) 20 MG tablet Take 1 tablet (20 mg total) by mouth daily. 30 tablet 3  . traZODone (DESYREL) 50 MG tablet Take 0.5-1 tablets (25-50 mg total) by mouth at bedtime as needed for sleep. 30 tablet 3  . Wound Dressings (MEDIHONEY WOUND/BURN DRESSING) GEL Apply to affected are 3 times a week, and cover with sterile dressing. 30 mL 1   No current facility-administered medications on file prior to visit.   Allergies  Allergen Reactions  . Ace Inhibitors Itching and Cough  . Naproxen Anaphylaxis, Shortness Of Breath and Other (See Comments)    Throat swells. cannot breathe, and causes GI distress      Objective: There were no vitals filed for this visit.  General: Patient is awake, alert, oriented x 3 and in no acute distress.  Dermatology: Skin is warm and dry bilateral with a now healed ulcer with mild reactive keratosis plantar and lateral aspect of the left foot. There is no malodor, no drainage, no erythema, 1+ pitting edema lower extremity. No other acute signs of infection.  Nails x 10 thickened and mildly elongated with changes consistent with onychomycosis   Vascular: Dorsalis Pedis pulse  = 1/4 Bilateral,  Posterior Tibial pulse = 1/4 Bilateral,  Capillary Fill Time < 5 seconds 1+ pitting edema bilateral as noted above.  Neurologic: Protective sensation diminished bilateral.  Musculosketal: There is Charcot deformity noted on left foot.  Mild pain to previous ulceration that is now healed at lateral aspect on left like previous.   dNo results for input(s): GRAMSTAIN, LABORGA in the last 8760 hours.  Assessment and Plan:  Problem List Items Addressed This Visit      Endocrine   Charcot foot due to diabetes mellitus ([place])    Other Visit Diagnoses    Pre-ulcerative calluses    -  Primary   PAD (peripheral artery disease) ([place])       Pain due to onychomycosis of toenails of both feet       Diabetic peripheral neuropathy associated with type 2 diabetes mellitus ([place])         -Examined patient and  re-discussed the progression of the now healed wound and need for AFO versus Crow boot -Applied protective offloading padding dressing of Mepilex border to the area and advised patient to do the same daily until he can get his custom AFO with offloading insole -Patient to continue to limit activities to tolerance -Mechanically debrided nails x10 using a sterile nail nipper and filed using a rotary bur -Patient to return to office for pickup of AFO when called.  Landis Martins, DPM

## 2020-02-18 ENCOUNTER — Encounter (HOSPITAL_COMMUNITY): Payer: Medicare Other

## 2020-02-18 ENCOUNTER — Ambulatory Visit: Payer: Medicare Other | Admitting: Vascular Surgery

## 2020-02-20 ENCOUNTER — Other Ambulatory Visit: Payer: Self-pay

## 2020-02-20 ENCOUNTER — Encounter: Payer: Self-pay | Admitting: Family Medicine

## 2020-02-20 ENCOUNTER — Ambulatory Visit (INDEPENDENT_AMBULATORY_CARE_PROVIDER_SITE_OTHER): Payer: Medicare Other | Admitting: Family Medicine

## 2020-02-20 VITALS — BP 189/74 | HR 72 | Temp 98.4°F | Ht 66.0 in | Wt 166.8 lb

## 2020-02-20 DIAGNOSIS — H9122 Sudden idiopathic hearing loss, left ear: Secondary | ICD-10-CM

## 2020-02-20 DIAGNOSIS — E11610 Type 2 diabetes mellitus with diabetic neuropathic arthropathy: Secondary | ICD-10-CM | POA: Diagnosis not present

## 2020-02-20 DIAGNOSIS — I10 Essential (primary) hypertension: Secondary | ICD-10-CM | POA: Diagnosis not present

## 2020-02-20 DIAGNOSIS — E1165 Type 2 diabetes mellitus with hyperglycemia: Secondary | ICD-10-CM | POA: Diagnosis not present

## 2020-02-20 MED ORDER — HYDRALAZINE HCL 10 MG PO TABS: 10.0000 mg | ORAL_TABLET | Freq: Three times a day (TID) | ORAL | 1 refills | Status: DC

## 2020-02-20 MED ORDER — HYDRALAZINE HCL 10 MG PO TABS: 10.0000 mg | ORAL_TABLET | Freq: Three times a day (TID) | ORAL | 1 refills | Status: AC

## 2020-02-20 NOTE — Progress Notes (Signed)
7/8/20219:59 AM  [name] [birth year], 69 y.o., male [id number]  Chief Complaint  Patient presents with  . Follow-up    no cast on left foot anymore, wat the wound to be access. Not able to hear out of left ear. Has not taken bp pills in a few days    HPI:   Patient is a 69 y.o. male with past medical history significant for HTN, FYBO,FB5ZWCH complications,CKD4,HLP, PAD who presents today for routine followup  Last OV June 2021 - changed to basaglar/trulicity [facility], started hydralazine   Saw ent June 14, [facility], care everywhere, audiometric testing suggest severe sn sudden loss, plan for MRI brain and intraTM steroid injections   Saw podiatry June 30th - charcot foot, left, wound healed, AFO ordered, he is worried as starting to have irritation now that he is back on DM shoes waiting AFO  He reports he received trulicity,started using about 2 weeks ago He is tolerating trulicity well He reports he has not received basaglar He is not doing 70/30 Glucometer reviewed Checking cbgs on most day, once, random Range for past 2 weeks, 123-175 Denies any side effects  He did not take BP meds this morning Does not check at home He reports he has never been on hydralazine He is taking amlodipine and torsemide   Lab Results  Component Value Date   HGBA1C 12.8 (H) 12/31/2019   HGBA1C  11/20/2019     Comment:     >15   HGBA1C >15 11/20/2019   Lab Results  Component Value Date   MICROALBUR 6.46 (H) 08/19/2008   LDLCALC 74 05/06/2019   CREATININE 2.54 (H) 01/08/2020    Depression screen PHQ 2/9 02/03/2020 01/16/2020 06/21/2019  Decreased Interest 0 0 0  Down, Depressed, Hopeless 0 0 1  PHQ - 2 Score 0 0 1  Some recent data might be hidden    Fall Risk  02/03/2020 01/16/2020 06/21/2019 06/03/2019 05/17/2019  Falls in the past year? 0 0 0 0 0  Number falls in past yr: 0 - 0 0 0  Injury with Fall? 0 - 0 0 0  Follow up Falls evaluation completed;Education provided  Falls evaluation completed Falls evaluation completed - -     Allergies  Allergen Reactions  . Ace Inhibitors Itching and Cough  . Naproxen Anaphylaxis, Shortness Of Breath and Other (See Comments)    Throat swells. cannot breathe, and causes GI distress    Prior to Admission medications   Medication Sig Start Date End Date Taking? Authorizing Provider  amLODipine (NORVASC) 10 MG tablet Take 1 tablet (10 mg total) by mouth daily. 12/26/19  Yes O'Neal, Cassie Freer, MD  aspirin 81 MG chewable tablet Chew 1 tablet (81 mg total) by mouth every morning. 09/25/19  Yes O'Neal, Cassie Freer, MD  atorvastatin (LIPITOR) 40 MG tablet Take 1 tablet (40 mg total) by mouth every morning. 12/26/19  Yes O'Neal, Cassie Freer, MD  blood glucose meter kit and supplies KIT Dispense based on patient and insurance preference. Use up to four times daily as directed. 09/10/19  Yes Philemon Kingdom, MD  blood glucose meter kit and supplies Dispense based on patient and insurance preference. Use up to four times daily as directed. (FOR ICD-10 E10.9, E11.9). 01/03/20  Yes Bhagat, Bhavinkumar, PA  Blood Glucose Monitoring Suppl (RELION CONFIRM GLUCOSE MONITOR) w/Device KIT Use to check blood sugar 3 times a day. 09/10/19  Yes Philemon Kingdom, MD  clopidogrel (PLAVIX) 75 MG tablet Take 1 tablet (  75 mg total) by mouth daily with breakfast. 01/09/20  Yes [name], MD  Dulaglutide (TRULICITY) 9.17 HX/5.0VW SOPN Inject 0.5 mLs (0.75 mg total) into the skin once a week. 01/16/20 04/15/20 Yes [name], MD  gabapentin (NEURONTIN) 300 MG capsule Take 1 capsule (300 mg total) by mouth 2 (two) times daily. 01/10/20  Yes [name], MD  hydrALAZINE (APRESOLINE) 25 MG tablet Take 1 tablet (25 mg total) by mouth every 8 (eight) hours. 01/09/20  Yes [name], MD  Insulin Glargine [facility]) 100 UNIT/ML Inject 0.3 mLs (30 Units total) into the skin at bedtime. 01/16/20 04/15/20 Yes [name], MD    insulin NPH-regular Human (70-30) 100 UNIT/ML injection Inject 10 Units into the skin 2 (two) times daily with a meal. 01/09/20  Yes [name], MD  Insulin Syringe-Needle U-100 (INSULIN SYRINGE .5CC/31GX5/16") 31G X 5/16" 0.5 ML MISC Use to inject insulin twice a day 01/09/20  Yes [name], MD  Lancets (FREESTYLE) lancets Use as instructed 09/18/14  Yes Delfina Redwood, MD  Lancets 30G [place]  04/06/16  Yes [provider]  Multiple Vitamins-Minerals (CENTRUM SILVER 50+MEN) TABS Take 1 tablet by mouth daily with breakfast.   Yes [provider]  ofloxacin (FLOXIN) 0.3 % OTIC solution Place 5 drops into the left ear daily. 01/07/20  Yes Horton, Barbette Hair, MD  Polyethyl Glycol-Propyl Glycol (SYSTANE OP) Place 1 drop into both eyes as needed (for dryness).    Yes [provider]  torsemide (DEMADEX) 20 MG tablet Take 1 tablet (20 mg total) by mouth daily. 01/03/20  Yes Bhagat, Bhavinkumar, PA  traZODone (DESYREL) 50 MG tablet Take 0.5-1 tablets (25-50 mg total) by mouth at bedtime as needed for sleep. 01/09/20  Yes [name], MD  Wound Dressings (MEDIHONEY WOUND/BURN DRESSING) GEL Apply to affected are 3 times a week, and cover with sterile dressing. 08/21/19  Yes Landis Martins, DPM    Past Medical History:  Diagnosis Date  . Cellulitis and abscess of face 10/11/2007   Qualifier: Diagnosis of  By: Amil Amen MD, Benjamine Mola    . Diabetic retinopathy ([place])   . Diabetic retinopathy associated with type 2 diabetes mellitus ([place]) 09/13/2014   He works for [employer]  . Fluid overload 09/03/2012   Post op  . GERD (gastroesophageal reflux disease)   . Visual impairment     Past Surgical History:  Procedure Laterality Date  . ABDOMINAL AORTOGRAM W/LOWER EXTREMITY Left 05/30/2019   Procedure: ABDOMINAL AORTOGRAM W/LOWER EXTREMITY;  Surgeon: Marty Heck, MD;  Location: [facility];  Service: Cardiovascular;  Laterality: Left;  .  CATARACT EXTRACTION W/ INTRAOCULAR LENS  IMPLANT, BILATERAL    . EYE SURGERY Bilateral    "laser OR for diabetic retinopathy"  . INGUINAL HERNIA REPAIR     [name] 07/12/2010), "don't remember which side"  . LAPAROTOMY  08/23/2012   Procedure: EXPLORATORY LAPAROTOMY;  Surgeon: [name], DO;  Location: [facility];  Service: General;  Laterality: N/A;  exploratory laparotomy with lysis of adhesions  . LYSIS OF ADHESION  08/23/2012   Procedure: LYSIS OF ADHESION;  Surgeon: [name], DO;  Location: [facility];  Service: General;;  . PERIPHERAL VASCULAR BALLOON ANGIOPLASTY  05/30/2019   Procedure: PERIPHERAL VASCULAR BALLOON ANGIOPLASTY;  Surgeon: Marty Heck, MD;  Location: [facility];  Service: Cardiovascular;;  left anterior tibial  . ROBOT ASSISTED LAPAROSCOPIC RADICAL PROSTATECTOMY  2000's   "  had to finish manually after machine broke"  . SHOULDER SURGERY  1970's   separation; from playing football"    Social History   Tobacco Use  . Smoking status: Former Smoker    Packs/day: 2.50    Years: 3.00    Pack years: 7.50    Types: Cigarettes    Quit date: 11/09/1966    Years since quitting: 53.3  . Smokeless tobacco: Never Used  Substance Use Topics  . Alcohol use: Yes    Comment: 04/08/2015 "maybe a beer/ or 2 or a glass of wine monthly"    Family History  Problem Relation Age of Onset  . Diabetes Mellitus II Mother   . Colon cancer Neg Hx     Review of Systems  Constitutional: Negative for chills and fever.  Respiratory: Negative for cough and shortness of breath.   Cardiovascular: Negative for chest pain, palpitations and leg swelling.  Gastrointestinal: Negative for abdominal pain, nausea and vomiting.     OBJECTIVE:  Today's Vitals   02/20/20 0920  BP: (!) 189/74  Pulse: 72  Temp: 98.4 F (36.9 C)  SpO2: 100%  Weight: 166 lb 12.8 oz (75.7 kg)  Height: _0  (1.676 m)   Body mass index is 26.92 kg/m.   Physical Exam Vitals and nursing note  reviewed.  Constitutional:      Appearance: He is well-developed.  HENT:     Head: Normocephalic and atraumatic.  Eyes:     Conjunctiva/sclera: Conjunctivae normal.     Pupils: Pupils are equal, round, and reactive to light.  Cardiovascular:     Rate and Rhythm: Normal rate and regular rhythm.     Heart sounds: No murmur heard.  No friction rub. No gallop.   Pulmonary:     Effort: Pulmonary effort is normal.     Breath sounds: Normal breath sounds. No wheezing or rales.  Musculoskeletal:     Cervical back: Neck supple.  Skin:    General: Skin is warm and dry.  Neurological:     Mental Status: He is alert and oriented to person, place, and time.     No results found for this or any previous visit (from the past 24 hour(s)).  No results found.   ASSESSMENT and PLAN  1. Essential hypertension Not at goal. Not taking hydralazine. Restart at 59m TID, reviewed r/se/b  2. Uncontrolled type 2 diabetes mellitus with hyperglycemia ([place] Tolerating trulicity well, per [facility] basaglar was delivered as well, asked patient check back in his home, start at 10 units a day, cont trulicity once a week. Checking daily fasting cbgs  3. Sudden hearing loss, left Managed by ENT  4. Charcot foot due to diabetes mellitus [place] Managed by podiatry, advised reach out about current concerns  Other orders - hydrALAZINE (APRESOLINE) 10 MG tablet; Take 1 tablet (10 mg total) by mouth every 8 (eight) hours.  Return in about 4 weeks (around 03/19/2020).    IRutherford Guys MD [facility] [id number].  [phone] [phone]

## 2020-02-20 NOTE — Patient Instructions (Signed)
° ° ° °  If you have lab work done today you will be contacted with your lab results within the next 2 weeks.  If you have not heard from us then please contact us. The fastest way to get your results is to register for My Chart. ° ° °IF you received an x-ray today, you will receive an invoice from [facility]. Please contact [facility] at [phone] with questions or concerns regarding your invoice.  ° °IF you received labwork today, you will receive an invoice from [facility]. Please contact [facility] at [phone] with questions or concerns regarding your invoice.  ° °Our billing staff will not be able to assist you with questions regarding bills from these companies. ° °You will be contacted with the lab results as soon as they are available. The fastest way to get your results is to activate your My Chart account. Instructions are located on the last page of this paperwork. If you have not heard from us regarding the results in 2 weeks, please contact this office. °  ° ° ° °

## 2020-03-03 ENCOUNTER — Other Ambulatory Visit: Payer: Self-pay

## 2020-03-03 ENCOUNTER — Ambulatory Visit
Admission: RE | Admit: 2020-03-03 | Discharge: 2020-03-03 | Disposition: A | Discharge status: Home / Self Care | Payer: Medicare Other | Source: Ambulatory Visit | Attending: Physician Assistant | Admitting: Physician Assistant

## 2020-03-03 DIAGNOSIS — H9120 Sudden idiopathic hearing loss, unspecified ear: Secondary | ICD-10-CM

## 2020-03-03 MED ORDER — GADOBENATE DIMEGLUMINE 529 MG/ML IV SOLN
15.0000 mL | Freq: Once | INTRAVENOUS | Status: AC | PRN
  Administered 2020-03-03: 15 mL via INTRAVENOUS

## 2020-03-05 ENCOUNTER — Other Ambulatory Visit: Payer: Medicare Other

## 2020-03-19 ENCOUNTER — Other Ambulatory Visit: Payer: Self-pay

## 2020-03-19 ENCOUNTER — Encounter: Payer: Self-pay | Admitting: Sports Medicine

## 2020-03-19 ENCOUNTER — Ambulatory Visit (INDEPENDENT_AMBULATORY_CARE_PROVIDER_SITE_OTHER): Payer: Medicare Other | Admitting: Sports Medicine

## 2020-03-19 ENCOUNTER — Ambulatory Visit: Payer: Medicare Other | Admitting: Orthotics

## 2020-03-19 DIAGNOSIS — L84 Corns and callosities: Secondary | ICD-10-CM

## 2020-03-19 DIAGNOSIS — E11610 Type 2 diabetes mellitus with diabetic neuropathic arthropathy: Secondary | ICD-10-CM | POA: Diagnosis not present

## 2020-03-19 DIAGNOSIS — E1142 Type 2 diabetes mellitus with diabetic polyneuropathy: Secondary | ICD-10-CM | POA: Diagnosis not present

## 2020-03-19 DIAGNOSIS — I739 Peripheral vascular disease, unspecified: Secondary | ICD-10-CM

## 2020-03-19 NOTE — Progress Notes (Signed)
Patient didn't pick up brace as shoes didn't accomodate; dawn ordered extra wide.

## 2020-03-19 NOTE — Progress Notes (Signed)
Subjective: [name] is a 69 y.o. male patient seen in office for follow-up evaluation of ulceration of the left foot.  Patient reports that ulcer is healed and states that he was fitted for shoes today with Liliane Channel but they were too small.  Patient has no other pedal complaints at this time.  Last A1c not recorded    FBS not recorded   Patient Active Problem List   Diagnosis Date Noted  . Sudden hearing loss, left 01/27/2020  . Tympanic membrane rupture 01/08/2020  . Dyslipidemia 08/19/2019  . Uncontrolled type 2 diabetes mellitus with hyperglycemia ([place])   . Hyperkalemia   . Acute diastolic HF (heart failure) ([place])   . Heart block AV complete ([place])   . Acute renal failure with acute tubular necrosis superimposed on stage 4 chronic kidney disease ([place])   . Optic atrophy of both eyes 06/13/2019  . H/O small bowel obstruction 05/17/2019  . PVD (peripheral vascular disease) ([place]) 05/14/2019  . Ulcer of left foot due to type 2 diabetes mellitus ([place])   . Skin ulcer of left foot, limited to breakdown of skin ([place])   . Charcot foot due to diabetes mellitus ([place])   . Charcot's joint of foot, left   . SBO (small bowel obstruction) ([place]) 05/06/2019  . Complete heart block ([place]) 05/06/2019  . Moderate protein-calorie malnutrition ([place])   . Diabetic foot ulcer ([place]) 10/19/2018  . Diabetic foot infection ([place])   . Pseudophakia, both eyes 10/04/2018  . CRI (chronic renal insufficiency), stage 4 (severe) ([place]) 09/27/2018  . Adrenal insufficiency ([place]) 04/09/2015  . Peripheral edema 09/17/2014  . Shortness of breath 09/17/2014  . Bronchospasm, acute 09/16/2014  . Partial small bowel obstruction ([place]) 09/16/2014  . Hyperglycemia   . Diabetic retinopathy associated with type 2 diabetes mellitus ([place]) 09/13/2014  . Abdominal pain 09/13/2014  . Cough 11/15/2012  . Pharyngitis 09/14/2012  . Fluid overload 09/03/2012  . Ileus following gastrointestinal surgery ([place]) 09/03/2012  .  Hypokalemia 08/30/2012  . Small bowel obstruction s/p LOA [id number] 08/21/2012  . Abnormal EKG 08/21/2012  . Chest pain 08/21/2012  . Macular degeneration (senile) of retina 12/15/2011  . Lens replaced 12/15/2011  . Vitreous hemorrhage ([place]) 12/15/2011  . Essential hypertension 08/19/2008  . Regional enteritis of small intestine ([place]) 01/03/2008  . ERECTILE DYSFUNCTION, ORGANIC 01/03/2008  . Sarcoidosis 07/19/2007  . Type 2 diabetes, uncontrolled, with retinopathy ([place]) 07/19/2007  . CERUMEN IMPACTION, BILATERAL 07/19/2007  . URINARY INCONTINENCE, STRESS, MALE 08/17/2006  . PROSTATE CANCER, HX OF 08/17/2006   Current Outpatient Medications on File Prior to Visit  Medication Sig Dispense Refill  . amLODipine (NORVASC) 10 MG tablet Take 1 tablet (10 mg total) by mouth daily. 90 tablet 3  . aspirin 81 MG chewable tablet Chew 1 tablet (81 mg total) by mouth every morning. 90 tablet 3  . atorvastatin (LIPITOR) 40 MG tablet Take 1 tablet (40 mg total) by mouth every morning. 90 tablet 1  . blood glucose meter kit and supplies KIT Dispense based on patient and insurance preference. Use up to four times daily as directed. 1 each 1  . blood glucose meter kit and supplies Dispense based on patient and insurance preference. Use up to four times daily as directed. (FOR ICD-10 E10.9, E11.9). 1 each 0  . Blood Glucose Monitoring Suppl (RELION CONFIRM GLUCOSE MONITOR) w/Device KIT Use to check blood sugar 3 times a day. 1 kit 0  . clopidogrel (PLAVIX) 75 MG tablet Take 1 tablet (  75 mg total) by mouth daily with breakfast. 30 tablet 3  . Dulaglutide (TRULICITY) 6.86 HU/8.3FG SOPN Inject 0.5 mLs (0.75 mg total) into the skin once a week. 6 mL 0  . gabapentin (NEURONTIN) 300 MG capsule Take 1 capsule (300 mg total) by mouth 2 (two) times daily. 60 capsule 5  . hydrALAZINE (APRESOLINE) 10 MG tablet Take 1 tablet (10 mg total) by mouth every 8 (eight) hours. 270 tablet 1  . Insulin Glargine (BASAGLAR KWIKPEN)  100 UNIT/ML Inject 0.3 mLs (30 Units total) into the skin at bedtime. 27 mL 0  . Insulin Syringe-Needle U-100 (INSULIN SYRINGE .5CC/31GX5/16") 31G X 5/16" 0.5 ML MISC Use to inject insulin twice a day 100 each 3  . Lancets (FREESTYLE) lancets Use as instructed 100 each 0  . Lancets 30G MISC     . Multiple Vitamins-Minerals (CENTRUM SILVER 50+MEN) TABS Take 1 tablet by mouth daily with breakfast.    . ofloxacin (FLOXIN) 0.3 % OTIC solution Place 5 drops into the left ear daily. 5 mL 0  . Polyethyl Glycol-Propyl Glycol (SYSTANE OP) Place 1 drop into both eyes as needed (for dryness).     . torsemide (DEMADEX) 20 MG tablet Take 1 tablet (20 mg total) by mouth daily. 30 tablet 3  . traZODone (DESYREL) 50 MG tablet Take 0.5-1 tablets (25-50 mg total) by mouth at bedtime as needed for sleep. 30 tablet 3  . Wound Dressings (MEDIHONEY WOUND/BURN DRESSING) GEL Apply to affected are 3 times a week, and cover with sterile dressing. 30 mL 1   No current facility-administered medications on file prior to visit.   Allergies  Allergen Reactions  . Ace Inhibitors Itching and Cough  . Naproxen Anaphylaxis, Shortness Of Breath and Other (See Comments)    Throat swells. cannot breathe, and causes GI distress      Objective: There were no vitals filed for this visit.  General: Patient is awake, alert, oriented x 3 and in no acute distress.  Dermatology: Skin is warm and dry bilateral with a continued healed ulcer with mild reactive keratosis plantar and lateral aspect of the left foot and dry blood blister at left 1st toe with no signs of infection. There is no malodor, no drainage, no erythema, no other acute signs of infection.  Nails x 10 thickened and minimally elongated with changes consistent with onychomycosis   Vascular: Dorsalis Pedis pulse = 1/4 Bilateral,  Posterior Tibial pulse = 1/4 Bilateral,  Capillary Fill Time < 5 seconds 1+ pitting edema bilateral as noted above.  Neurologic:  Protective sensation diminished bilateral.  Musculosketal: There is Charcot deformity noted on left foot.  Mild pain at lateral aspect on left foot unchanged from prior.    dNo results for input(s): GRAMSTAIN, LABORGA in the last 8760 hours.  Assessment and Plan:  Problem List Items Addressed This Visit      Endocrine   Charcot foot due to diabetes mellitus ([place])    Other Visit Diagnoses    Pre-ulcerative calluses    -  Primary   PAD (peripheral artery disease) ([place])       Diabetic peripheral neuropathy associated with type 2 diabetes mellitus ([place])         -Examined patient -Advised patient to refrain from picking at dry skin to foot/toe -Encouraged daily foot inspection -Return to see Liliane Channel to pick up shoes  Landis Martins, DPM

## 2020-03-20 ENCOUNTER — Ambulatory Visit: Payer: Medicare Other | Admitting: Family Medicine

## 2020-03-20 ENCOUNTER — Encounter: Payer: Self-pay | Admitting: Family Medicine

## 2020-03-20 ENCOUNTER — Other Ambulatory Visit: Payer: Self-pay

## 2020-03-20 VITALS — BP 142/78 | HR 69 | Temp 98.3°F | Resp 15 | Ht 66.0 in | Wt 163.4 lb

## 2020-03-20 DIAGNOSIS — E11610 Type 2 diabetes mellitus with diabetic neuropathic arthropathy: Secondary | ICD-10-CM

## 2020-03-20 DIAGNOSIS — E1165 Type 2 diabetes mellitus with hyperglycemia: Secondary | ICD-10-CM | POA: Diagnosis not present

## 2020-03-20 DIAGNOSIS — IMO0002 Reserved for concepts with insufficient information to code with codable children: Secondary | ICD-10-CM

## 2020-03-20 DIAGNOSIS — I739 Peripheral vascular disease, unspecified: Secondary | ICD-10-CM

## 2020-03-20 DIAGNOSIS — I10 Essential (primary) hypertension: Secondary | ICD-10-CM

## 2020-03-20 DIAGNOSIS — E11319 Type 2 diabetes mellitus with unspecified diabetic retinopathy without macular edema: Secondary | ICD-10-CM

## 2020-03-20 DIAGNOSIS — N184 Chronic kidney disease, stage 4 (severe): Secondary | ICD-10-CM

## 2020-03-20 LAB — POCT GLYCOSYLATED HEMOGLOBIN (HGB A1C): Hemoglobin A1C: 9.2 % — AB (ref 4.0–5.6)

## 2020-03-20 MED ORDER — "INSULIN SYRINGE 31G X 5/16"" 0.5 ML MISC": 3 refills | Status: DC

## 2020-03-20 MED ORDER — INSULIN PEN NEEDLE 32G X 6 MM MISC: 11 refills | Status: AC

## 2020-03-20 MED ORDER — GABAPENTIN 300 MG PO CAPS: 300.0000 mg | ORAL_CAPSULE | Freq: Two times a day (BID) | ORAL | 5 refills | Status: AC

## 2020-03-20 MED ORDER — BASAGLAR KWIKPEN 100 UNIT/ML ~~LOC~~ SOPN: 5.0000 [IU] | PEN_INJECTOR | Freq: Every day | SUBCUTANEOUS | 0 refills | Status: AC

## 2020-03-20 NOTE — Progress Notes (Signed)
8/6/20218:52 AM  [name] Mar 13, 1951, 69 y.o., male [id number]  Chief Complaint  Patient presents with  . Diabetes    pt has been checking BG running about 130, pt has not been exercising but has been working on his diet decreasing junk foods, sugars, and processed foods, eating more fish    HPI:   Patient is a 69 y.o. male with past medical history significant for HTN, PPJK,DT2IZTI complications,CKD4,HLP, PADwho presents today for routine followup  Last ov July 2021 - started hydralazine 31m TID  Patient overall doing ok Another family death Has been doing well [facility]icity [facility] Has not been doing basaglar as did not have pen needles Has been working on dLand[employer]in glucometer: reading for July 2021 Bedtime: 156, 125, 169, 114, 128 Afternoon: 114, 153, 175, 123 Morning: 158, 166, 131, 143, 141 Middle night: 75, symptomatic 7 day avg 156 podiarty ; foot almost healed, getting prosthetics to wear inside shoe Saw ENT - no structural damage based on imagining, plans for intra ear steroid injection, hearing has not returned  Lab Results  Component Value Date   HGBA1C 12.8 (H) 12/31/2019   HGBA1C  11/20/2019     Comment:     >15   HGBA1C >15 11/20/2019   Lab Results  Component Value Date   MICROALBUR 6.46 (H) 08/19/2008   LDLCALC 74 05/06/2019   CREATININE 2.54 (H) 01/08/2020    Depression screen PHQ 2/9 03/20/2020 02/03/2020 01/16/2020  Decreased Interest 0 0 0  Down, Depressed, Hopeless 0 0 0  PHQ - 2 Score 0 0 0  Some recent data might be hidden    Fall Risk  03/20/2020 02/03/2020 01/16/2020 06/21/2019 06/03/2019  Falls in the past year? 0 0 0 0 0  Number falls in past yr: 0 0 - 0 0  Injury with Fall? 0 0 - 0 0  Risk for fall due to : No Fall Risks - - - -  Follow up Falls evaluation completed Falls evaluation completed;Education provided Falls evaluation completed Falls evaluation completed -     Allergies  Allergen Reactions    . Ace Inhibitors Itching and Cough  . Naproxen Anaphylaxis, Shortness Of Breath and Other (See Comments)    Throat swells. cannot breathe, and causes GI distress    Prior to Admission medications   Medication Sig Start Date End Date Taking? Authorizing Provider  amLODipine (NORVASC) 10 MG tablet Take 1 tablet (10 mg total) by mouth daily. 12/26/19  Yes O'Neal, WCassie Freer MD  aspirin 81 MG chewable tablet Chew 1 tablet (81 mg total) by mouth every morning. 09/25/19  Yes O'Neal, WCassie Freer MD  atorvastatin (LIPITOR) 40 MG tablet Take 1 tablet (40 mg total) by mouth every morning. 12/26/19  Yes O'Neal, WCassie Freer MD  blood glucose meter kit and supplies Dispense based on patient and insurance preference. Use up to four times daily as directed. (FOR ICD-10 E10.9, E11.9). 01/03/20  Yes Bhagat, Bhavinkumar, PA  Blood Glucose Monitoring Suppl (RELION CONFIRM GLUCOSE MONITOR) w/Device KIT Use to check blood sugar 3 times a day. 09/10/19  Yes GPhilemon Kingdom MD  clopidogrel (PLAVIX) 75 MG tablet Take 1 tablet (75 mg total) by mouth daily with breakfast. 01/09/20  Yes SRutherford Guys MD  Dulaglutide (TRULICITY) 04.58MKD/9.8PJSOPN Inject 0.5 mLs (0.75 mg total) into the skin once a week. 01/16/20 04/15/20 Yes SRutherford Guys MD  gabapentin (NEURONTIN) 300 MG capsule Take 1 capsule (300  mg total) by mouth 2 (two) times daily. 01/10/20  Yes [name], MD  hydrALAZINE (APRESOLINE) 10 MG tablet Take 1 tablet (10 mg total) by mouth every 8 (eight) hours. 02/20/20  Yes [name], MD  Insulin Glargine [facility]) 100 UNIT/ML Inject 0.3 mLs (30 Units total) into the skin at bedtime. 01/16/20 04/15/20 Yes [name], MD  Insulin Syringe-Needle U-100 (INSULIN SYRINGE .5CC/31GX5/16") 31G X 5/16" 0.5 ML MISC Use to inject insulin twice a day 01/09/20  Yes [name], MD  Lancets (FREESTYLE) lancets Use as instructed 09/18/14  Yes Delfina Redwood, MD  Multiple Vitamins-Minerals  (CENTRUM SILVER 50+MEN) TABS Take 1 tablet by mouth daily with breakfast.   Yes [provider]  Polyethyl Glycol-Propyl Glycol (SYSTANE OP) Place 1 drop into both eyes as needed (for dryness).    Yes [provider]  torsemide (DEMADEX) 20 MG tablet Take 1 tablet (20 mg total) by mouth daily. 01/03/20  Yes Bhagat, Bhavinkumar, PA  traZODone (DESYREL) 50 MG tablet Take 0.5-1 tablets (25-50 mg total) by mouth at bedtime as needed for sleep. 01/09/20  Yes [name], MD  Wound Dressings (MEDIHONEY WOUND/BURN DRESSING) GEL Apply to affected are 3 times a week, and cover with sterile dressing. 08/21/19  Yes Stover, Titorya, DPM  blood glucose meter kit and supplies KIT Dispense based on patient and insurance preference. Use up to four times daily as directed. Patient not taking: Reported on 03/20/2020 09/10/19   Philemon Kingdom, MD  Lancets 30G [place]  04/06/16   [provider]  ofloxacin (FLOXIN) 0.3 % OTIC solution Place 5 drops into the left ear daily. Patient not taking: Reported on 03/20/2020 01/07/20   Horton, Barbette Hair, MD    Past Medical History:  Diagnosis Date  . Cellulitis and abscess of face 10/11/2007   Qualifier: Diagnosis of  By: Amil Amen MD, Benjamine Mola    . Diabetic retinopathy ([place])   . Diabetic retinopathy associated with type 2 diabetes mellitus ([place]) 09/13/2014   He works for [employer]  . Fluid overload 09/03/2012   Post op  . GERD (gastroesophageal reflux disease)   . Visual impairment     Past Surgical History:  Procedure Laterality Date  . ABDOMINAL AORTOGRAM W/LOWER EXTREMITY Left 05/30/2019   Procedure: ABDOMINAL AORTOGRAM W/LOWER EXTREMITY;  Surgeon: Marty Heck, MD;  Location: [facility];  Service: Cardiovascular;  Laterality: Left;  . CATARACT EXTRACTION W/ INTRAOCULAR LENS  IMPLANT, BILATERAL    . EYE SURGERY Bilateral    "laser OR for diabetic retinopathy"  . INGUINAL HERNIA REPAIR     [name] 07/12/2010),  "don't remember which side"  . LAPAROTOMY  08/23/2012   Procedure: EXPLORATORY LAPAROTOMY;  Surgeon: [name], DO;  Location: [facility];  Service: General;  Laterality: N/A;  exploratory laparotomy with lysis of adhesions  . LYSIS OF ADHESION  08/23/2012   Procedure: LYSIS OF ADHESION;  Surgeon: [name], DO;  Location: [facility];  Service: General;;  . PERIPHERAL VASCULAR BALLOON ANGIOPLASTY  05/30/2019   Procedure: PERIPHERAL VASCULAR BALLOON ANGIOPLASTY;  Surgeon: Marty Heck, MD;  Location: [facility];  Service: Cardiovascular;;  left anterior tibial  . ROBOT ASSISTED LAPAROSCOPIC RADICAL PROSTATECTOMY  2000's   "had to finish manually after machine broke"  . SHOULDER SURGERY  1970's   separation; from playing football"    Social History   Tobacco Use  . Smoking status: Former Smoker    Packs/day:  2.50    Years: 3.00    Pack years: 7.50    Types: Cigarettes    Quit date: 11/09/1966    Years since quitting: 53.3  . Smokeless tobacco: Never Used  Substance Use Topics  . Alcohol use: Yes    Comment: 04/08/2015 "maybe a beer/ or 2 or a glass of wine monthly"    Family History  Problem Relation Age of Onset  . Diabetes Mellitus II Mother   . Colon cancer Neg Hx     Review of Systems  Constitutional: Negative for chills and fever.  Respiratory: Negative for cough and shortness of breath.   Cardiovascular: Negative for chest pain, palpitations and leg swelling.  Gastrointestinal: Negative for abdominal pain, nausea and vomiting.     OBJECTIVE:  Today's Vitals   03/20/20 0821 03/20/20 0827 03/20/20 0937  BP: (!) 190/80 (!) 155/78 (!) 142/78  Pulse: 69    Resp: 15    Temp: 98.3 F (36.8 C)    TempSrc: Temporal    SpO2: 98%    Weight: 163 lb 6.4 oz (74.1 kg)    Height: _0  (1.676 m)     Body mass index is 26.37 kg/m.   Physical Exam Vitals and nursing note reviewed.  Constitutional:      Appearance: He is well-developed.  HENT:     Head:  Normocephalic and atraumatic.  Eyes:     Extraocular Movements: Extraocular movements intact.     Conjunctiva/sclera: Conjunctivae normal.     Pupils: Pupils are equal, round, and reactive to light.  Cardiovascular:     Rate and Rhythm: Normal rate and regular rhythm.     Heart sounds: No murmur heard.  No friction rub. No gallop.   Pulmonary:     Effort: Pulmonary effort is normal.     Breath sounds: Normal breath sounds. No wheezing, rhonchi or rales.  Musculoskeletal:     Cervical back: Neck supple.     Right lower leg: Edema (trace ptting bilaterally) present.     Left lower leg: Edema present.  Skin:    General: Skin is warm and dry.  Neurological:     Mental Status: He is alert and oriented to person, place, and time.     Results for orders placed or performed in visit on 03/20/20 (from the past 24 hour(s))  POCT A1C     Status: Abnormal   Collection Time: 03/20/20  9:18 AM  Result Value Ref Range   Hemoglobin A1C 9.2 (A) 4.0 - 5.6 %   HbA1c POC (<> result, manual entry)     HbA1c, POC (prediabetic range)     HbA1c, POC (controlled diabetic range)      No results found.   ASSESSMENT and PLAN  1. Type 2 diabetes, uncontrolled, with retinopathy ([place]) a1c and cbgs sign improved. Start lantus 5 units at bedtime. Reviewed r/se/b. Reviewed ssx/mgt of hypoglycemia. - POCT A1C - Comprehensive metabolic panel - Lipid panel - TSH - Microalbumin/Creatinine Ratio, Urine  2. Essential hypertension Controlled. Continue current regime.   3. Charcot foot due to diabetes mellitus ([place]) Managed by podiatry.   Other orders - gabapentin (NEURONTIN) 300 MG capsule; Take 1 capsule (300 mg total) by mouth 2 (two) times daily. - Insulin Glargine (BASAGLAR KWIKPEN) 100 UNIT/ML; Inject 0.05 mLs (5 Units total) into the skin at bedtime.  Return in about 4 weeks (around 04/17/2020).    [name], MD [facility] at [place], [place] [address]  Ph.   [phone] Fax [phone]

## 2020-03-20 NOTE — Patient Instructions (Addendum)
Start basaglar at 5 units every night. basaglar control fasting (morning sugars). Goal 90-130 and no middle night low sugars    If you have lab work done today you will be contacted with your lab results within the next 2 weeks.  If you have not heard from [place] then please contact us. The fastest way to get your results is to register for My Chart.   IF you received an x-ray today, you will receive an invoice from [facility]. Please contact [facility] at [phone] with questions or concerns regarding your invoice.   IF you received labwork today, you will receive an invoice from [facility]. Please contact [facility] at [phone] with questions or concerns regarding your invoice.   Our billing staff will not be able to assist you with questions regarding bills from these companies.  You will be contacted with the lab results as soon as they are available. The fastest way to get your results is to activate your My Chart account. Instructions are located on the last page of this paperwork. If you have not heard from [place] regarding the results in 2 weeks, please contact this office.

## 2020-03-21 LAB — COMPREHENSIVE METABOLIC PANEL
ALT: 23 IU/L (ref 0–44)
AST: 21 IU/L (ref 0–40)
Albumin/Globulin Ratio: 1.4 (ref 1.2–2.2)
Albumin: 3.7 g/dL — ABNORMAL LOW (ref 3.8–4.8)
Alkaline Phosphatase: 109 IU/L (ref 48–121)
BUN/Creatinine Ratio: 13 (ref 10–24)
BUN: 34 mg/dL — ABNORMAL HIGH (ref 8–27)
Bilirubin Total: 0.7 mg/dL (ref 0.0–1.2)
CO2: 25 mmol/L (ref 20–29)
Calcium: 9.8 mg/dL (ref 8.6–10.2)
Chloride: 101 mmol/L (ref 96–106)
Creatinine, Ser: 2.64 mg/dL — ABNORMAL HIGH (ref 0.76–1.27)
GFR calc Af Amer: 27 mL/min/{1.73_m2} — ABNORMAL LOW (ref >59)
GFR calc non Af Amer: 23 mL/min/{1.73_m2} — ABNORMAL LOW (ref >59)
Globulin, Total: 2.7 g/dL (ref 1.5–4.5)
Glucose: 142 mg/dL — ABNORMAL HIGH (ref 65–99)
Potassium: 4 mmol/L (ref 3.5–5.2)
Sodium: 140 mmol/L (ref 134–144)
Total Protein: 6.4 g/dL (ref 6.0–8.5)

## 2020-03-21 LAB — LIPID PANEL
Chol/HDL Ratio: 3.3 ratio (ref 0.0–5.0)
Cholesterol, Total: 169 mg/dL (ref 100–199)
HDL: 52 mg/dL (ref >39)
LDL Chol Calc (NIH): 94 mg/dL (ref 0–99)
Triglycerides: 133 mg/dL (ref 0–149)
VLDL Cholesterol Cal: 23 mg/dL (ref 5–40)

## 2020-03-21 LAB — TSH: TSH: 1.56 u[IU]/mL (ref 0.450–4.500)

## 2020-03-21 LAB — MICROALBUMIN / CREATININE URINE RATIO
Creatinine, Urine: 35.9 mg/dL
Microalb/Creat Ratio: 2261 mg/g creat — ABNORMAL HIGH (ref 0–29)
Microalbumin, Urine: 811.8 ug/mL

## 2020-03-26 ENCOUNTER — Ambulatory Visit: Payer: Medicare Other | Admitting: Orthotics

## 2020-03-26 ENCOUNTER — Other Ambulatory Visit: Payer: Self-pay

## 2020-03-26 DIAGNOSIS — E11610 Type 2 diabetes mellitus with diabetic neuropathic arthropathy: Secondary | ICD-10-CM

## 2020-03-26 NOTE — Progress Notes (Signed)
Shoes didn't come in, on b/o until 8/21

## 2020-03-30 ENCOUNTER — Other Ambulatory Visit: Payer: Self-pay

## 2020-03-30 NOTE — Telephone Encounter (Signed)
When I tried to refill this medication it asking me to pick one of the alternate med on the list.

## 2020-03-30 NOTE — Telephone Encounter (Signed)
Pt called and stated his provider can do a verbal request with the assistance program. Pt states if provider does this then he can get supplies expedited and to him tomorrow. Please advise.

## 2020-03-30 NOTE — Telephone Encounter (Signed)
Pt. Called requesting refill on Trulicity and Basaglar. Pt. Asserts he requested this refill about a week ago but there does not seem to be a record on file.   Considering the pt. Has been seen recently and asserts they had a previous call, I have made this high priority.

## 2020-03-30 NOTE — Telephone Encounter (Signed)
Please advise on med refill. Pt gets these medication through patient assistance program

## 2020-03-31 ENCOUNTER — Telehealth: Payer: Self-pay

## 2020-03-31 NOTE — Telephone Encounter (Signed)
Pt rx [facility] for both insulins

## 2020-03-31 NOTE — Telephone Encounter (Signed)
[place] and they stated patient is too early for a refill and patient meds are on automatic refill. [facility] will reach out to [place] if something change or when they need an updated order.

## 2020-03-31 NOTE — Telephone Encounter (Signed)
Please address as discussed, gets meds from [facility] care

## 2020-04-07 ENCOUNTER — Other Ambulatory Visit: Payer: Self-pay

## 2020-04-07 ENCOUNTER — Encounter: Payer: Self-pay | Admitting: Vascular Surgery

## 2020-04-07 ENCOUNTER — Ambulatory Visit (INDEPENDENT_AMBULATORY_CARE_PROVIDER_SITE_OTHER): Payer: Medicare Other | Admitting: Vascular Surgery

## 2020-04-07 ENCOUNTER — Ambulatory Visit (INDEPENDENT_AMBULATORY_CARE_PROVIDER_SITE_OTHER)
Admission: RE | Admit: 2020-04-07 | Discharge: 2020-04-07 | Disposition: A | Discharge status: Home / Self Care | Payer: Medicare Other | Source: Ambulatory Visit | Attending: Family Medicine | Admitting: Family Medicine

## 2020-04-07 ENCOUNTER — Ambulatory Visit (HOSPITAL_COMMUNITY)
Admission: RE | Admit: 2020-04-07 | Discharge: 2020-04-07 | Disposition: A | Discharge status: Home / Self Care | Payer: Medicare Other | Source: Ambulatory Visit | Attending: Family Medicine | Admitting: Family Medicine

## 2020-04-07 VITALS — BP 135/62 | HR 62 | Temp 97.0°F | Resp 18 | Ht 66.0 in | Wt 167.0 lb

## 2020-04-07 DIAGNOSIS — I739 Peripheral vascular disease, unspecified: Secondary | ICD-10-CM | POA: Insufficient documentation

## 2020-04-07 NOTE — Progress Notes (Signed)
Patient name: [name] MRN: [id number] DOB: [birth year] Sex: male  REASON FOR VISIT: Follow-up of left lower extremity, left foot wound  HPI: [name] is a 69 y.o. male with history of diabetes, hypertension, hyperlipidemia, stage III chronic kidney disease that presents for scheduled follow-up of his left lower extremity.  He previously underwent left anterior tibial angioplasty on 05/30/2019 for CLI with tissue loss.  He was not seen in follow-up that time.  Ultimately states that this wound healed but he is now being seen for another wound on the plantar surface of his left foot.  He feels the wound is making significant progress and is being followed by podiatry.  No significant rest pain.  Still taking plavix.    Past Medical History:  Diagnosis Date  . Cellulitis and abscess of face 10/11/2007   Qualifier: Diagnosis of  By: Amil Amen MD, Benjamine Mola    . Diabetic retinopathy ([place])   . Diabetic retinopathy associated with type 2 diabetes mellitus ([place]) 09/13/2014   He works for [employer]  . Fluid overload 09/03/2012   Post op  . GERD (gastroesophageal reflux disease)   . Visual impairment     Past Surgical History:  Procedure Laterality Date  . ABDOMINAL AORTOGRAM W/LOWER EXTREMITY Left 05/30/2019   Procedure: ABDOMINAL AORTOGRAM W/LOWER EXTREMITY;  Surgeon: Marty Heck, MD;  Location: [facility];  Service: Cardiovascular;  Laterality: Left;  . CATARACT EXTRACTION W/ INTRAOCULAR LENS  IMPLANT, BILATERAL    . EYE SURGERY Bilateral    "laser OR for diabetic retinopathy"  . INGUINAL HERNIA REPAIR     [name] 07/12/2010), "don't remember which side"  . LAPAROTOMY  08/23/2012   Procedure: EXPLORATORY LAPAROTOMY;  Surgeon: [name], DO;  Location: [facility];  Service: General;  Laterality: N/A;  exploratory laparotomy with lysis of adhesions  . LYSIS OF ADHESION  08/23/2012   Procedure: LYSIS OF ADHESION;  Surgeon: [name], DO;  [facility];  Service: General;;  . PERIPHERAL VASCULAR BALLOON ANGIOPLASTY  05/30/2019   Procedure: PERIPHERAL VASCULAR BALLOON ANGIOPLASTY;  Surgeon: Marty Heck, MD;  Location: [facility];  Service: Cardiovascular;;  left anterior tibial  . ROBOT ASSISTED LAPAROSCOPIC RADICAL PROSTATECTOMY  2000's   "had to finish manually after machine broke"  . SHOULDER SURGERY  1970's   separation; from playing football"    Family History  Problem Relation Age of Onset  . Diabetes Mellitus II Mother   . Colon cancer Neg Hx     SOCIAL HISTORY: Social History   Tobacco Use  . Smoking status: Former Smoker    Packs/day: 2.50    Years: 3.00    Pack years: 7.50    Types: Cigarettes    Quit date: 11/09/1966    Years since quitting: 53.4  . Smokeless tobacco: Never Used  Substance Use Topics  . Alcohol use: Yes    Comment: 04/08/2015 "maybe a beer/ or 2 or a glass of wine monthly"    Allergies  Allergen Reactions  . Ace Inhibitors Itching and Cough  . Naproxen Anaphylaxis, Shortness Of Breath and Other (See Comments)    Throat swells. cannot breathe, and causes GI distress    Current Outpatient Medications  Medication Sig Dispense Refill  . amLODipine (NORVASC) 10 MG tablet Take 1 tablet (10 mg total) by mouth daily. 90 tablet 3  . aspirin 81 MG chewable tablet Chew 1 tablet (81 mg total) by mouth every morning. Fisher  tablet 3  . atorvastatin (LIPITOR) 40 MG tablet Take 1 tablet (40 mg total) by mouth every morning. 90 tablet 1  . blood glucose meter kit and supplies KIT Dispense based on patient and insurance preference. Use up to four times daily as directed. 1 each 1  . blood glucose meter kit and supplies Dispense based on patient and insurance preference. Use up to four times daily as directed. (FOR ICD-10 E10.9, E11.9). 1 each 0  . Blood Glucose Monitoring Suppl (RELION CONFIRM GLUCOSE MONITOR) w/Device KIT Use to check blood sugar 3 times a day. 1 kit 0  . clopidogrel (PLAVIX)  75 MG tablet Take 1 tablet (75 mg total) by mouth daily with breakfast. 30 tablet 3  . Dulaglutide (TRULICITY) 0.94 MH/6.8GS SOPN Inject 0.5 mLs (0.75 mg total) into the skin once a week. 6 mL 0  . gabapentin (NEURONTIN) 300 MG capsule Take 1 capsule (300 mg total) by mouth 2 (two) times daily. 60 capsule 5  . hydrALAZINE (APRESOLINE) 10 MG tablet Take 1 tablet (10 mg total) by mouth every 8 (eight) hours. 270 tablet 1  . Insulin Glargine (BASAGLAR KWIKPEN) 100 UNIT/ML Inject 0.05 mLs (5 Units total) into the skin at bedtime. 4.5 mL 0  . Insulin Pen Needle 32G X 6 MM MISC Use new needle with each basaglar injection. Dx E11.9, Z79.4 50 each 11  . Lancets (FREESTYLE) lancets Use as instructed 100 each 0  . Lancets 30G MISC     . Multiple Vitamins-Minerals (CENTRUM SILVER 50+MEN) TABS Take 1 tablet by mouth daily with breakfast.    . ofloxacin (FLOXIN) 0.3 % OTIC solution Place 5 drops into the left ear daily. 5 mL 0  . Polyethyl Glycol-Propyl Glycol (SYSTANE OP) Place 1 drop into both eyes as needed (for dryness).     . torsemide (DEMADEX) 20 MG tablet Take 1 tablet (20 mg total) by mouth daily. 30 tablet 3  . traZODone (DESYREL) 50 MG tablet Take 0.5-1 tablets (25-50 mg total) by mouth at bedtime as needed for sleep. 30 tablet 3  . Wound Dressings (MEDIHONEY WOUND/BURN DRESSING) GEL Apply to affected are 3 times a week, and cover with sterile dressing. 30 mL 1   No current facility-administered medications for this visit.    REVIEW OF SYSTEMS:  '[X]'  denotes positive finding, '[ ]'  denotes negative finding Cardiac  Comments:  Chest pain or chest pressure:    Shortness of breath upon exertion:    Short of breath when lying flat:    Irregular heart rhythm:        Vascular    Pain in calf, thigh, or hip brought on by ambulation:    Pain in feet at night that wakes you up from your sleep:     Blood clot in your veins:    Leg swelling:         Pulmonary    Oxygen at home:    Productive cough:      Wheezing:         Neurologic    Sudden weakness in arms or legs:     Sudden numbness in arms or legs:     Sudden onset of difficulty speaking or slurred speech:    Temporary loss of vision in one eye:     Problems with dizziness:         Gastrointestinal    Blood in stool:     Vomited blood:         Genitourinary  Burning when urinating:     Blood in urine:        Psychiatric    Major depression:         Hematologic    Bleeding problems:    Problems with blood clotting too easily:        Skin    Rashes or ulcers:        Constitutional    Fever or chills:      PHYSICAL EXAM: Vitals:   04/07/20 1149  BP: 135/62  Pulse: 62  Resp: 18  Temp: (!) 97 F (36.1 C)  TempSrc: Temporal  SpO2: 100%  Weight: 167 lb (75.8 kg)  Height: '5\' 6"'  (1.676 m)    GENERAL: The patient is a well-nourished male, in no acute distress. The vital signs are documented above. CARDIAC: There is a regular rate and rhythm.  VASCULAR:  Palpable femoral pulses bilateral groins Palpable right DP/PT No palpable left pedal pulses Large left plantar ulcer as pictured below PULMONARY: There is good air exchange bilaterally without wheezing or rales. ABDOMEN: Soft and non-tender with normal pitched bowel sounds.  MUSCULOSKELETAL: There are no major deformities or cyanosis. NEUROLOGIC: No focal weakness or paresthesias are detected.       DATA:   ABIs today show 1.17 on the right triphasic and 1.09 on the left but monophasic  Left leg arterial duplex shows a high-grade distal SFA stenosis with a velocity of 476 as well as a distal pop stenosis of 50 to 74% velocity 298 and monophasic runoff in the tibials   Assessment/Plan:  69 year old male presents with critical limb ischemia left lower extremity with tissue loss.  He previously underwent a left AT angioplasty last year on 05/30/2019.  I have not seen him in follow-up this year or last year after his intervention and he now has a  large ulcer on the bottom of the left foot as pictured above.  I have advised that we proceed with another left lower extremity arteriogram and possible intervention given the size of the ulcer as noted above.  He has a high-grade SFA stenosis in the setting of known tibial disease.  We will get him scheduled soon as possible for left leg intervention.  Risks and benefits were also discussed in detail including risk of anesthesia, groin access, vessel injury, limb loss etc.  Discussed that I think intervention would give him the best chance of healing the wound.  He should continue follow-up with podiatry.   Marty Heck, MD [facility] Office: [phone]

## 2020-04-08 ENCOUNTER — Ambulatory Visit (INDEPENDENT_AMBULATORY_CARE_PROVIDER_SITE_OTHER): Payer: Medicare Other | Admitting: Internal Medicine

## 2020-04-08 ENCOUNTER — Encounter: Payer: Self-pay | Admitting: Internal Medicine

## 2020-04-08 DIAGNOSIS — Z5329 Procedure and treatment not carried out because of patient's decision for other reasons: Secondary | ICD-10-CM

## 2020-04-08 NOTE — Progress Notes (Signed)
Patient ID: [name], male   DOB: 1950/08/24, 69 y.o.   MRN: [id number]   NO SHOW  HPI: [name] is a 69 y.o.-year-old male, initially referred by his PCP, Dr. Pamella Pert, returning for follow-up DM2, dx in 1990s, insulin-dependent since 2000, very poorly controlled, with many complications (CKD, diabetic retinopathy, peripheral neuropathy-Charcot foot, diabetic ulcer, erectile dysfunction).  Last visit 4 months ago.  He was frequently off insulin completely in the past due to financial difficulties/being homeless.  This situation resolved and, at last visit, I referred him to [facility] and for help with medications.  We restarted his premixed insulin at that time.  Reviewed HbA1c levels: Lab Results  Component Value Date   HGBA1C 9.2 (A) 03/20/2020   HGBA1C 12.8 (H) 12/31/2019   HGBA1C  11/20/2019     Comment:     >15   HGBA1C >15 11/20/2019   HGBA1C 13.0 (A) 08/19/2019   HGBA1C 10.3 (H) 05/06/2019   HGBA1C 11.7 (A) 04/19/2019   HGBA1C 12.0 (A) 02/07/2019   HGBA1C 13.9 (H) 10/19/2018   HGBA1C 11.9 (H) 07/18/2018   He is on: - Humulin 70/30 (pens) 16 to 20 units before breakfast and dinner >> restarted at last visit He is not working >> very limited resources.  At last visit, he was telling me that she was stretching his insulin and not take it daily.  He checks his sugars 0 to once a day: - am: n/c - 2h after b'fast: n/c - before lunch: n/c - 2h after lunch: n/c - before dinner: n/c - 2h after dinner: n/c - bedtime: n/c - nighttime: n/c Lowest sugar was 70 >> ?; he has hypoglycemia awareness in the 70s.  Highest sugar was ?.  Glucometer: ReliOn  Pt's meals are: - Breakfast: bran cereal + almond milk - vanilla - Lunch:  fruit - Dinner: fish, veggies and beans - Snacks: no snacks except popcorn He drinks diet drinks.  -+ CKD, last BUN/creatinine:  Lab Results  Component Value Date   BUN 34 (H) 03/20/2020   BUN 40 (H) 01/08/2020   CREATININE 2.64 (H)  03/20/2020   CREATININE 2.54 (H) 01/08/2020  He was on losartan 25 but stopped before last visit  -+ HL; last set of lipids: Lab Results  Component Value Date   CHOL 169 03/20/2020   HDL 52 03/20/2020   LDLCALC 94 03/20/2020   TRIG 133 03/20/2020   CHOLHDL 3.3 03/20/2020  On Lipitor 20-restarted at last visit.   - last eye exam was in 11/2019: Normal.  He previously had retinopathy  -no numbness and tingling in his feet.  He does have a history of peripheral neuropathy with left Charcot foot.  On gabapentin.  He sees Dr. [name] and Dr. Adah Perl.  Pt has FH of DM in sisters.  He also has a history of HTN, sarcoidosis, complete heart block, SBO, adrenal insufficiency -however, this is resolved, currently not on steroids.  Cortisol obtained at 9 AM was normal: Component     Latest Ref Rng & Units 05/07/2019  Cortisol, Plasma     ug/dL 17.7    ROS: Constitutional: no weight gain/no weight loss, no fatigue, no subjective hyperthermia, no subjective hypothermia Eyes: no blurry vision, no xerophthalmia ENT: no sore throat, no nodules palpated in neck, no dysphagia, no odynophagia, no hoarseness Cardiovascular: no CP/no SOB/no palpitations/no leg swelling Respiratory: no cough/no SOB/no wheezing Gastrointestinal: no N/no V/no D/no C/no acid reflux Musculoskeletal: no muscle aches/no joint aches Skin: no  rashes, no hair loss Neurological: no tremors/no numbness/no tingling/no dizziness  I reviewed pt's medications, allergies, PMH, social hx, family hx, and changes were documented in the history of present illness. Otherwise, unchanged from my initial visit note.  Past Medical History:  Diagnosis Date  . Cellulitis and abscess of face 10/11/2007   Qualifier: Diagnosis of  By: Amil Amen MD, Benjamine Mola    . Diabetic retinopathy ([place])   . Diabetic retinopathy associated with type 2 diabetes mellitus ([place]) 09/13/2014   He works for [employer]  . Fluid overload 09/03/2012    Post op  . GERD (gastroesophageal reflux disease)   . Visual impairment    Past Surgical History:  Procedure Laterality Date  . ABDOMINAL AORTOGRAM W/LOWER EXTREMITY Left 05/30/2019   Procedure: ABDOMINAL AORTOGRAM W/LOWER EXTREMITY;  Surgeon: Marty Heck, MD;  Location: [facility];  Service: Cardiovascular;  Laterality: Left;  . CATARACT EXTRACTION W/ INTRAOCULAR LENS  IMPLANT, BILATERAL    . EYE SURGERY Bilateral    "laser OR for diabetic retinopathy"  . INGUINAL HERNIA REPAIR     [name] 07/12/2010), "don't remember which side"  . LAPAROTOMY  08/23/2012   Procedure: EXPLORATORY LAPAROTOMY;  Surgeon: [name], DO;  [facility];  Service: General;  Laterality: N/A;  exploratory laparotomy with lysis of adhesions  . LYSIS OF ADHESION  08/23/2012   Procedure: LYSIS OF ADHESION;  Surgeon: [name], DO;  [facility];  Service: General;;  . PERIPHERAL VASCULAR BALLOON ANGIOPLASTY  05/30/2019   Procedure: PERIPHERAL VASCULAR BALLOON ANGIOPLASTY;  Surgeon: Marty Heck, MD;  Location: [facility];  Service: Cardiovascular;;  left anterior tibial  . ROBOT ASSISTED LAPAROSCOPIC RADICAL PROSTATECTOMY  2000's   "had to finish manually after machine broke"  . SHOULDER SURGERY  1970's   separation; from playing football"   Social History   Socioeconomic History  . Marital status: Married    Spouse name: Not on file  . Number of children: 1  . Years of education: Not on file  . Highest education level: Not on file  Occupational History  .  Glass blower/designer  Social Needs  . Financial resource strain: Not on file  . Food insecurity    Worry: Not on file    Inability: Not on file  . Transportation needs    Medical: Not on file    Non-medical: Not on file  Tobacco Use  . Smoking status: Former Smoker    Packs/day: 2.50    Years: 3.00    Pack years: 7.50    Types: Cigarettes    Quit date: 11/09/1966    Years since quitting: 52.5  . Smokeless  tobacco: Never Used  Substance and Sexual Activity  . Alcohol use: Yes    Comment: 04/08/2015 "maybe a beer/ or 2 or a glass of wine monthly"  . Drug use: No   Current Outpatient Medications on File Prior to Visit  Medication Sig Dispense Refill  . amLODipine (NORVASC) 10 MG tablet Take 1 tablet (10 mg total) by mouth daily. 90 tablet 3  . aspirin 81 MG chewable tablet Chew 1 tablet (81 mg total) by mouth every morning. 90 tablet 3  . atorvastatin (LIPITOR) 40 MG tablet Take 1 tablet (40 mg total) by mouth every morning. 90 tablet 1  . blood glucose meter kit and supplies KIT Dispense based on patient and insurance preference. Use up to four times daily as directed. 1 each 1  . blood  glucose meter kit and supplies Dispense based on patient and insurance preference. Use up to four times daily as directed. (FOR ICD-10 E10.9, E11.9). 1 each 0  . Blood Glucose Monitoring Suppl (RELION CONFIRM GLUCOSE MONITOR) w/Device KIT Use to check blood sugar 3 times a day. 1 kit 0  . clopidogrel (PLAVIX) 75 MG tablet Take 1 tablet (75 mg total) by mouth daily with breakfast. 30 tablet 3  . Dulaglutide (TRULICITY) 8.67 JQ/4.9EE SOPN Inject 0.5 mLs (0.75 mg total) into the skin once a week. 6 mL 0  . gabapentin (NEURONTIN) 300 MG capsule Take 1 capsule (300 mg total) by mouth 2 (two) times daily. 60 capsule 5  . hydrALAZINE (APRESOLINE) 10 MG tablet Take 1 tablet (10 mg total) by mouth every 8 (eight) hours. 270 tablet 1  . Insulin Glargine (BASAGLAR KWIKPEN) 100 UNIT/ML Inject 0.05 mLs (5 Units total) into the skin at bedtime. 4.5 mL 0  . Insulin Pen Needle 32G X 6 MM MISC Use new needle with each basaglar injection. Dx E11.9, Z79.4 50 each 11  . Lancets (FREESTYLE) lancets Use as instructed 100 each 0  . Lancets 30G MISC     . Multiple Vitamins-Minerals (CENTRUM SILVER 50+MEN) TABS Take 1 tablet by mouth daily with breakfast.    . ofloxacin (FLOXIN) 0.3 % OTIC solution Place 5 drops into the left ear daily. 5  mL 0  . Polyethyl Glycol-Propyl Glycol (SYSTANE OP) Place 1 drop into both eyes as needed (for dryness).     . torsemide (DEMADEX) 20 MG tablet Take 1 tablet (20 mg total) by mouth daily. 30 tablet 3  . traZODone (DESYREL) 50 MG tablet Take 0.5-1 tablets (25-50 mg total) by mouth at bedtime as needed for sleep. 30 tablet 3  . Wound Dressings (MEDIHONEY WOUND/BURN DRESSING) GEL Apply to affected are 3 times a week, and cover with sterile dressing. 30 mL 1   No current facility-administered medications on file prior to visit.   Allergies  Allergen Reactions  . Ace Inhibitors Itching and Cough  . Naproxen Anaphylaxis, Shortness Of Breath and Other (See Comments)    Throat swells. cannot breathe, and causes GI distress   Family History  Problem Relation Age of Onset  . Diabetes Mellitus II Mother   . Colon cancer Neg Hx     PE: There were no vitals taken for this visit. Wt Readings from Last 3 Encounters:  04/07/20 167 lb (75.8 kg)  03/20/20 163 lb 6.4 oz (74.1 kg)  02/20/20 166 lb 12.8 oz (75.7 kg)   Constitutional: normal weight, in NAD Eyes: PERRLA, EOMI, no exophthalmos ENT: moist mucous membranes, no thyromegaly, no cervical lymphadenopathy Cardiovascular: RRR, No MRG, + BLE edema, L Charcot foot Respiratory: CTA B Gastrointestinal: abdomen soft, NT, ND, BS+ Musculoskeletal: no deformities, strength intact in all 4 Skin: moist, warm, no rashes Neurological: no tremor with outstretched hands, DTR normal in all 4  ASSESSMENT: 1. DM2, insulin-dependent, uncontrolled, with complications - CKD stage 3 - DR - L foot diabetic ulcer - PN with Charcot foot - ED  2. HL  3.  Financial difficulties  PLAN:  1. Patient with longstanding, very uncontrolled diabetes, noncompliant with his regimen of premixed insulin due to many factors, including finances.  At last visit, he was off insulin but we restarted at that time.  He was able to continue it and his sugars started to  improve.  In fact, his latest HbA1c obtained at the beginning of this month was  dramatically better, at 9.2%, decreased from >15%!  - I suggested to:  Patient Instructions  Please continue: - 70/30 (ReliOn) insulin 16-20 units before breakfast and 16-20 units before dinner  Try to check sugars 2x a day.  Please return in 3 months with your sugar log.   - advised to check sugars at different times of the day - 2x a day, rotating check times - advised for yearly eye exams >> he is UTD - return to clinic in 3 months  2. HL -Reviewed latest lipid panel from 03/2020: LDL above our goal of less than 70 for him, the rest of the fractions at goal: Lab Results  Component Value Date   CHOL 169 03/20/2020   HDL 52 03/20/2020   LDLCALC 94 03/20/2020   TRIG 133 03/20/2020   CHOLHDL 3.3 03/20/2020  -On Lipitor 20, of which he was about at last visit but I refilled it then  3.  Financial difficulties -He was running out of insulin frequently and his HbA1c levels were very high as a consequence. -At last visit I referred him to [facility] to help with his medications   Philemon Kingdom, MD PhD [facility] Endocrinology

## 2020-04-09 ENCOUNTER — Ambulatory Visit: Payer: Medicare Other | Admitting: Sports Medicine

## 2020-04-13 ENCOUNTER — Other Ambulatory Visit (HOSPITAL_COMMUNITY): Payer: Medicare Other

## 2020-04-14 NOTE — Addendum Note (Signed)
Addended by: [name] on: 04/14/2020 01:54 PM   Modules accepted: Orders

## 2020-04-15 ENCOUNTER — Telehealth: Payer: Self-pay

## 2020-04-15 ENCOUNTER — Other Ambulatory Visit (HOSPITAL_COMMUNITY)
Admission: RE | Admit: 2020-04-15 | Discharge: 2020-04-15 | Disposition: A | Discharge status: Home / Self Care | Payer: Medicare Other | Source: Ambulatory Visit | Attending: Vascular Surgery | Admitting: Vascular Surgery

## 2020-04-15 DIAGNOSIS — Z20822 Contact with and (suspected) exposure to covid-19: Secondary | ICD-10-CM | POA: Diagnosis not present

## 2020-04-15 DIAGNOSIS — Z01812 Encounter for preprocedural laboratory examination: Secondary | ICD-10-CM | POA: Insufficient documentation

## 2020-04-15 LAB — SARS CORONAVIRUS 2 (TAT 6-24 HRS): SARS Coronavirus 2: NEGATIVE

## 2020-04-15 NOTE — Telephone Encounter (Signed)
Opened in error

## 2020-04-15 NOTE — Telephone Encounter (Signed)
Pt called triage this afternoon to let us know he has a "fifty cent piece" sized blister on his R LE. He is scheduled for AGM tomorrow. I told him he should still report to [facility] at [place]. Pt verbalized understanding.

## 2020-04-15 NOTE — Telephone Encounter (Signed)
Pt called to get details on his procedure. We went over all pre-op instructions and he has been set up for a COVID test. He had one done a week ago at [place] and thought that would suffice but he is having one [facility] testing site today. Pt verbalized understanding.

## 2020-04-16 ENCOUNTER — Ambulatory Visit (HOSPITAL_COMMUNITY)
Admission: RE | Admit: 2020-04-16 | Discharge: 2020-04-16 | Disposition: A | Discharge status: Home / Self Care | Payer: Medicare Other | Attending: Vascular Surgery | Admitting: Vascular Surgery

## 2020-04-16 ENCOUNTER — Other Ambulatory Visit: Payer: Self-pay

## 2020-04-16 ENCOUNTER — Other Ambulatory Visit: Payer: Self-pay | Admitting: Sports Medicine

## 2020-04-16 ENCOUNTER — Encounter (HOSPITAL_COMMUNITY): Payer: Self-pay | Admitting: Vascular Surgery

## 2020-04-16 ENCOUNTER — Encounter (HOSPITAL_COMMUNITY)
Admission: RE | Disposition: A | Discharge status: Home / Self Care | Payer: Self-pay | Source: Home / Self Care | Attending: Vascular Surgery

## 2020-04-16 ENCOUNTER — Ambulatory Visit (INDEPENDENT_AMBULATORY_CARE_PROVIDER_SITE_OTHER): Payer: Medicare Other | Admitting: Sports Medicine

## 2020-04-16 DIAGNOSIS — E11621 Type 2 diabetes mellitus with foot ulcer: Secondary | ICD-10-CM

## 2020-04-16 DIAGNOSIS — L97421 Non-pressure chronic ulcer of left heel and midfoot limited to breakdown of skin: Secondary | ICD-10-CM | POA: Diagnosis not present

## 2020-04-16 DIAGNOSIS — S90822A Blister (nonthermal), left foot, initial encounter: Secondary | ICD-10-CM

## 2020-04-16 DIAGNOSIS — Z7902 Long term (current) use of antithrombotics/antiplatelets: Secondary | ICD-10-CM | POA: Insufficient documentation

## 2020-04-16 DIAGNOSIS — L97529 Non-pressure chronic ulcer of other part of left foot with unspecified severity: Secondary | ICD-10-CM | POA: Insufficient documentation

## 2020-04-16 DIAGNOSIS — E1122 Type 2 diabetes mellitus with diabetic chronic kidney disease: Secondary | ICD-10-CM | POA: Insufficient documentation

## 2020-04-16 DIAGNOSIS — K219 Gastro-esophageal reflux disease without esophagitis: Secondary | ICD-10-CM | POA: Diagnosis not present

## 2020-04-16 DIAGNOSIS — I70245 Atherosclerosis of native arteries of left leg with ulceration of other part of foot: Secondary | ICD-10-CM | POA: Diagnosis not present

## 2020-04-16 DIAGNOSIS — N183 Chronic kidney disease, stage 3 unspecified: Secondary | ICD-10-CM | POA: Diagnosis not present

## 2020-04-16 DIAGNOSIS — Z7982 Long term (current) use of aspirin: Secondary | ICD-10-CM | POA: Diagnosis not present

## 2020-04-16 DIAGNOSIS — Z886 Allergy status to analgesic agent status: Secondary | ICD-10-CM | POA: Diagnosis not present

## 2020-04-16 DIAGNOSIS — E785 Hyperlipidemia, unspecified: Secondary | ICD-10-CM | POA: Insufficient documentation

## 2020-04-16 DIAGNOSIS — Z794 Long term (current) use of insulin: Secondary | ICD-10-CM | POA: Insufficient documentation

## 2020-04-16 DIAGNOSIS — E1151 Type 2 diabetes mellitus with diabetic peripheral angiopathy without gangrene: Secondary | ICD-10-CM | POA: Insufficient documentation

## 2020-04-16 DIAGNOSIS — Z79899 Other long term (current) drug therapy: Secondary | ICD-10-CM | POA: Diagnosis not present

## 2020-04-16 DIAGNOSIS — Z87891 Personal history of nicotine dependence: Secondary | ICD-10-CM | POA: Diagnosis not present

## 2020-04-16 DIAGNOSIS — E11610 Type 2 diabetes mellitus with diabetic neuropathic arthropathy: Secondary | ICD-10-CM

## 2020-04-16 DIAGNOSIS — I129 Hypertensive chronic kidney disease with stage 1 through stage 4 chronic kidney disease, or unspecified chronic kidney disease: Secondary | ICD-10-CM | POA: Diagnosis not present

## 2020-04-16 DIAGNOSIS — I70244 Atherosclerosis of native arteries of left leg with ulceration of heel and midfoot: Secondary | ICD-10-CM

## 2020-04-16 DIAGNOSIS — I739 Peripheral vascular disease, unspecified: Secondary | ICD-10-CM

## 2020-04-16 DIAGNOSIS — L089 Local infection of the skin and subcutaneous tissue, unspecified: Secondary | ICD-10-CM

## 2020-04-16 HISTORY — PX: ABDOMINAL AORTOGRAM W/LOWER EXTREMITY: CATH118223

## 2020-04-16 LAB — POCT I-STAT, CHEM 8
BUN: 48 mg/dL — ABNORMAL HIGH (ref 8–23)
Calcium, Ion: 1.1 mmol/L — ABNORMAL LOW (ref 1.15–1.40)
Chloride: 104 mmol/L (ref 98–111)
Creatinine, Ser: 2.9 mg/dL — ABNORMAL HIGH (ref 0.61–1.24)
Glucose, Bld: 149 mg/dL — ABNORMAL HIGH (ref 70–99)
HCT: 29 % — ABNORMAL LOW (ref 39.0–52.0)
Hemoglobin: 9.9 g/dL — ABNORMAL LOW (ref 13.0–17.0)
Potassium: 5 mmol/L (ref 3.5–5.1)
Sodium: 142 mmol/L (ref 135–145)
TCO2: 28 mmol/L (ref 22–32)

## 2020-04-16 LAB — GLUCOSE, CAPILLARY
Glucose-Capillary: 145 mg/dL — ABNORMAL HIGH (ref 70–99)
Glucose-Capillary: 115 mg/dL — ABNORMAL HIGH (ref 70–99)

## 2020-04-16 SURGERY — ABDOMINAL AORTOGRAM W/LOWER EXTREMITY
Anesthesia: LOCAL

## 2020-04-16 MED ORDER — FENTANYL CITRATE (PF) 100 MCG/2ML IJ SOLN
INTRAMUSCULAR | Status: AC
Start: 2020-04-16 — End: ?
  Filled 2020-04-16: qty 2

## 2020-04-16 MED ORDER — HEPARIN (PORCINE) IN NACL 1000-0.9 UT/500ML-% IV SOLN
INTRAVENOUS | Status: AC
  Filled 2020-04-16: qty 1000

## 2020-04-16 MED ORDER — SODIUM CHLORIDE 0.9 % IV SOLN: 250.0000 mL | INTRAVENOUS | Status: DC | PRN

## 2020-04-16 MED ORDER — SODIUM CHLORIDE 0.9% FLUSH: 3.0000 mL | INTRAVENOUS | Status: DC | PRN

## 2020-04-16 MED ORDER — IODIXANOL 320 MG/ML IV SOLN
INTRAVENOUS | Status: DC | PRN
  Administered 2020-04-16: 12 mL via INTRA_ARTERIAL

## 2020-04-16 MED ORDER — LABETALOL HCL 5 MG/ML IV SOLN: 10.0000 mg | INTRAVENOUS | Status: DC | PRN

## 2020-04-16 MED ORDER — SODIUM CHLORIDE 0.9 % IV SOLN: INTRAVENOUS | Status: DC

## 2020-04-16 MED ORDER — HYDRALAZINE HCL 20 MG/ML IJ SOLN: 5.0000 mg | INTRAMUSCULAR | Status: DC | PRN

## 2020-04-16 MED ORDER — FENTANYL CITRATE (PF) 100 MCG/2ML IJ SOLN
INTRAMUSCULAR | Status: DC | PRN
Start: 2020-04-16 — End: 2020-04-16
  Administered 2020-04-16: 25 ug via INTRAVENOUS

## 2020-04-16 MED ORDER — MIDAZOLAM HCL 2 MG/2ML IJ SOLN
INTRAMUSCULAR | Status: DC | PRN
  Administered 2020-04-16: 0.5 mg via INTRAVENOUS

## 2020-04-16 MED ORDER — SODIUM CHLORIDE 0.9% FLUSH: 3.0000 mL | Freq: Two times a day (BID) | INTRAVENOUS | Status: DC

## 2020-04-16 MED ORDER — LIDOCAINE HCL (PF) 1 % IJ SOLN
INTRAMUSCULAR | Status: AC
  Filled 2020-04-16: qty 30

## 2020-04-16 MED ORDER — ONDANSETRON HCL 4 MG/2ML IJ SOLN: 4.0000 mg | Freq: Four times a day (QID) | INTRAMUSCULAR | Status: DC | PRN

## 2020-04-16 MED ORDER — MIDAZOLAM HCL 2 MG/2ML IJ SOLN
INTRAMUSCULAR | Status: AC
  Filled 2020-04-16: qty 2

## 2020-04-16 MED ORDER — LIDOCAINE HCL (PF) 1 % IJ SOLN
INTRAMUSCULAR | Status: DC | PRN
  Administered 2020-04-16: 20 mL via INTRADERMAL

## 2020-04-16 MED ORDER — AMOXICILLIN-POT CLAVULANATE 875-125 MG PO TABS: 1.0000 | ORAL_TABLET | Freq: Two times a day (BID) | ORAL | 0 refills | Status: AC

## 2020-04-16 MED ORDER — HEPARIN (PORCINE) IN NACL 1000-0.9 UT/500ML-% IV SOLN
INTRAVENOUS | Status: DC | PRN
  Administered 2020-04-16 (×2): 500 mL

## 2020-04-16 MED ORDER — HEPARIN SODIUM (PORCINE) 1000 UNIT/ML IJ SOLN
INTRAMUSCULAR | Status: AC
  Filled 2020-04-16: qty 1

## 2020-04-16 MED ORDER — ACETAMINOPHEN 325 MG PO TABS: 650.0000 mg | ORAL_TABLET | ORAL | Status: DC | PRN

## 2020-04-16 SURGICAL SUPPLY — 16 items
CATH OMNI FLUSH 5F 65CM (CATHETERS) ×2 IMPLANT
CATH SOFT-VU 4F 65 STRAIGHT (CATHETERS) ×1 IMPLANT
CATH SOFT-VU STRAIGHT 4F 65CM (CATHETERS) ×2
DEVICE CLOSURE MYNXGRIP 5F (Vascular Products) ×2 IMPLANT
FILTER CO2 0.2 MICRON (VASCULAR PRODUCTS) ×2 IMPLANT
GUIDEWIRE ANGLED .035X150CM (WIRE) ×2 IMPLANT
KIT MICROPUNCTURE NIT STIFF (SHEATH) ×2 IMPLANT
KIT PV (KITS) ×2 IMPLANT
RESERVOIR CO2 (VASCULAR PRODUCTS) ×2 IMPLANT
SET FLUSH CO2 (MISCELLANEOUS) ×2 IMPLANT
SHEATH PINNACLE 5F 10CM (SHEATH) ×2 IMPLANT
SHEATH PROBE COVER 6X72 (BAG) ×2 IMPLANT
SYR MEDRAD MARK V 150ML (SYRINGE) ×2 IMPLANT
TRANSDUCER W/STOPCOCK (MISCELLANEOUS) ×2 IMPLANT
TRAY PV CATH (CUSTOM PROCEDURE TRAY) ×2 IMPLANT
WIRE BENTSON .035X145CM (WIRE) ×2 IMPLANT

## 2020-04-16 NOTE — Op Note (Signed)
Patient name: [name] MRN: [id number] DOB: 05/26/1951 Sex: male  04/16/2020 Pre-operative Diagnosis: Left plantar foot wound Post-operative diagnosis:  Same Surgeon:  Marty Heck, MD Procedure Performed: 1.  Ultrasound-guided access of right common femoral artery 2.  CO2 aortogram including catheter selection of the aorta 3.  Bilateral lower extremity arteriogram with selection of second-order branches in the left lower extremity 4.  Mynx closure of the right common femoral artery 5.  29 minutes of monitored moderate conscious sedation time  Indications: Patient is a 69 year old male who previously underwent a left anterior tibial artery angioplasty in 2020.  He was recently seen in clinic for follow-up and had a wound on the plantar surface of his left foot.  Duplex suggested a distal SFA high-grade stenosis with velocity of 476 and some moderate disease in the popliteal artery.  He presents today for left leg arteriogram and possible intervention after risks and benefits discussed.  Findings:   Aortogram showed no flow-limiting stenosis in the aortoiliac segment.  He has a very steep aortic bifurcation as previously documented.  Left lower extremity arteriogram shows a patent common femoral and profunda with a moderately diseased SFA but no focal flow-limiting stenosis to correlate with his duplex findings (only only focal area approximately 50% that did not appear flow limiting).  Popliteal artery was also patent without flow-limiting stenosis.  He had two-vessel runoff via peroneal and posterior tibial artery in the left leg that was very brisk down the left lower extremity.  There was stenosis in the ostium of the peroneal but again given very brisk flow down the foot and he feels the wound is healing, I elected not to treat this.   Right lower extremity arteriogram showed a patent common femoral, profunda, SFA, above and below-knee popliteal artery and apparent  three-vessel runoff.  Distal runoff was difficult to completely evaluate given use of CO2 for chronic kidney disease.   Procedure:  The patient was identified in the holding area and taken to room 8.  The patient was then placed supine on the table and prepped and draped in the usual sterile fashion.  A time out was called.  Ultrasound was used to evaluate the right common femoral artery.  It was patent .  A digital ultrasound image was acquired.  A micropuncture needle was used to access the right common femoral artery under ultrasound guidance.  An 018 wire was advanced without resistance and a micropuncture sheath was placed.  The 018 wire was removed and a benson wire was placed.  The micropuncture sheath was exchanged for a 5 french sheath.  An omniflush catheter was advanced over the wire to the level of L-1.  An abdominal angiogram was obtained with CO2.  Next the catheter was pulled down and bilateral lower extremity runoff was obtained with CO2.  All pertinent findings are noted above.  Ultimately to further evaluate the left SFA popliteal and tibial vessels elected to cross the aortic bifurcation.  Used Omni Flush catheter with a soft angled glide to get a wire down into the left SFA.  Given very steep aortic bifurcation used a 4 French straight catheter to get my catheter down the into the left common femoral.  We then got some very limited contrast shots of the left SFA popliteal and tibial vessels.  Total contrast in the case was 12 mL.  I did not see a high-grade stenosis in the SFA as demonstrated on duplex with very brisk flow.  He has two-vessel runoff via posterior tibial and peroneal artery.  He feels the wound is making progress so given the improvement I elected not to intervene on a less than 50% SFA stenosis and a stenosis in the proximal peroneal.  He has inline flow into the foot.  Wire and catheter were removed.  A mynx closure was deployed in the right common femoral artery.  Plan:  Patient will continue aspirin Plavix.  He has very brisk inflow into the left foot.  He needs to continue going to the wound clinic as instructed today.  I will have him follow-up with me in 1 month for wound check.   Marty Heck, MD [facility] Office: [phone]

## 2020-04-16 NOTE — Discharge Instructions (Signed)
Femoral Site Care This sheet gives you information about how to care for yourself after your procedure. Your health care provider may also give you more specific instructions. If you have problems or questions, contact your health care provider. What can I expect after the procedure?  After the procedure, it is common to have:  Bruising that usually fades within 1-2 weeks.  Tenderness at the site. Follow these instructions at home: Wound care 1. Follow instructions from your health care provider about how to take care of your insertion site. Make sure you: ? Wash your hands with soap and water before you change your bandage (dressing). If soap and water are not available, use hand sanitizer. ? Remove your dressing as told by your health care provider. In 24 hours 2. Do not take baths, swim, or use a hot tub until your health care provider approves. 3. You may shower 24-48 hours after the procedure or as told by your health care provider. ? Gently wash the site with plain soap and water. ? Pat the area dry with a clean towel. ? Do not rub the site. This may cause bleeding. 4. Do not apply powder or lotion to the site. Keep the site clean and dry. 5. Check your femoral site every day for signs of infection. Check for: ? Redness, swelling, or pain. ? Fluid or blood. ? Warmth. ? Pus or a bad smell. Activity 1. For the first 2-3 days after your procedure, or as long as directed: ? Avoid climbing stairs as much as possible. ? Do not squat. 2. Do not lift anything that is heavier than 10 lb (4.5 kg), or the limit that you are told, until your health care provider says that it is safe. For 5 days 3. Rest as directed. ? Avoid sitting for a long time without moving. Get up to take short walks every 1-2 hours. 4. Do not drive for 24 hours if you were given a medicine to help you relax (sedative). General instructions  Take over-the-counter and prescription medicines only as told by your  health care provider.  Keep all follow-up visits as told by your health care provider. This is important. Contact a health care provider if you have:  A fever or chills.  You have redness, swelling, or pain around your insertion site. Get help right away if:  The catheter insertion area swells very fast.  You pass out.  You suddenly start to sweat or your skin gets clammy.  The catheter insertion area is bleeding, and the bleeding does not stop when you hold steady pressure on the area.  The area near or just beyond the catheter insertion site becomes pale, cool, tingly, or numb. These symptoms may represent a serious problem that is an emergency. Do not wait to see if the symptoms will go away. Get medical help right away. Call your local emergency services (911 in the [place] Do not drive yourself to the hospital. Summary  After the procedure, it is common to have bruising that usually fades within 1-2 weeks.  Check your femoral site every day for signs of infection.  Do not lift anything that is heavier than 10 lb (4.5 kg), or the limit that you are told, until your health care provider says that it is safe. This information is not intended to replace advice given to you by your health care provider. Make sure you discuss any questions you have with your health care provider. Document Revised: 08/14/2017 Document Reviewed: 08/14/2017   Elsevier Patient Education  2020 [employer]   

## 2020-04-16 NOTE — Progress Notes (Signed)
Subjective: [name] is a 69 y.o. male patient seen in office for follow-up evaluation of ulceration of the left foot.  Patient reports that he had to do some moving and did a lot of walking and standing and noticed a blood blister on the left foot.  Patient also reports that he left the hospital today after having an angiogram.  Patient has no other pedal complaints at this time.  Last A1c not recorded    FBS not recorded   Patient Active Problem List   Diagnosis Date Noted  . Sudden hearing loss, left 01/27/2020  . Tympanic membrane rupture 01/08/2020  . Dyslipidemia 08/19/2019  . Hyperkalemia   . Acute diastolic HF (heart failure) ([place])   . Heart block AV complete ([place])   . Acute renal failure with acute tubular necrosis superimposed on stage 4 chronic kidney disease ([place])   . Optic atrophy of both eyes 06/13/2019  . H/O small bowel obstruction 05/17/2019  . PVD (peripheral vascular disease) ([place]) 05/14/2019  . Skin ulcer of left foot, limited to breakdown of skin ([place])   . Charcot foot due to diabetes mellitus ([place])   . Charcot's joint of foot, left   . SBO (small bowel obstruction) ([place]) 05/06/2019  . Complete heart block ([place]) 05/06/2019  . Moderate protein-calorie malnutrition ([place])   . Diabetic foot ulcer ([place]) 10/19/2018  . Diabetic foot infection ([place])   . Pseudophakia, both eyes 10/04/2018  . CRI (chronic renal insufficiency), stage 4 (severe) ([place]) 09/27/2018  . Adrenal insufficiency ([place]) 04/09/2015  . Peripheral edema 09/17/2014  . Shortness of breath 09/17/2014  . Bronchospasm, acute 09/16/2014  . Partial small bowel obstruction ([place]) 09/16/2014  . Hyperglycemia   . Diabetic retinopathy associated with type 2 diabetes mellitus ([place]) 09/13/2014  . Abdominal pain 09/13/2014  . Cough 11/15/2012  . Pharyngitis 09/14/2012  . Fluid overload 09/03/2012  . Ileus following gastrointestinal surgery ([place]) 09/03/2012  . Hypokalemia 08/30/2012  . Small bowel  obstruction s/p LOA [id number] 08/21/2012  . Abnormal EKG 08/21/2012  . Chest pain 08/21/2012  . Macular degeneration (senile) of retina 12/15/2011  . Lens replaced 12/15/2011  . Vitreous hemorrhage ([place]) 12/15/2011  . Essential hypertension 08/19/2008  . Regional enteritis of small intestine ([place]) 01/03/2008  . ERECTILE DYSFUNCTION, ORGANIC 01/03/2008  . Sarcoidosis 07/19/2007  . Type 2 diabetes, uncontrolled, with retinopathy ([place]) 07/19/2007  . CERUMEN IMPACTION, BILATERAL 07/19/2007  . URINARY INCONTINENCE, STRESS, MALE 08/17/2006  . PROSTATE CANCER, HX OF 08/17/2006   Current Outpatient Medications on File Prior to Visit  Medication Sig Dispense Refill  . amLODipine (NORVASC) 10 MG tablet Take 1 tablet (10 mg total) by mouth daily. 90 tablet 3  . aspirin 81 MG chewable tablet Chew 1 tablet (81 mg total) by mouth every morning. (Patient taking differently: Chew 81 mg by mouth daily. ) 90 tablet 3  . atorvastatin (LIPITOR) 40 MG tablet Take 1 tablet (40 mg total) by mouth every morning. 90 tablet 1  . blood glucose meter kit and supplies KIT Dispense based on patient and insurance preference. Use up to four times daily as directed. 1 each 1  . blood glucose meter kit and supplies Dispense based on patient and insurance preference. Use up to four times daily as directed. (FOR ICD-10 E10.9, E11.9). 1 each 0  . Blood Glucose Monitoring Suppl (RELION CONFIRM GLUCOSE MONITOR) w/Device KIT Use to check blood sugar 3 times a day. 1 kit 0  . clopidogrel (PLAVIX) 75 MG tablet  Take 1 tablet (75 mg total) by mouth daily with breakfast. 30 tablet 3  . gabapentin (NEURONTIN) 300 MG capsule Take 1 capsule (300 mg total) by mouth 2 (two) times daily. 60 capsule 5  . hydrALAZINE (APRESOLINE) 10 MG tablet Take 1 tablet (10 mg total) by mouth every 8 (eight) hours. 270 tablet 1  . Insulin Glargine (BASAGLAR KWIKPEN) 100 UNIT/ML Inject 0.05 mLs (5 Units total) into the skin at bedtime. 4.5 mL 0  . Insulin  Pen Needle 32G X 6 MM MISC Use new needle with each basaglar injection. Dx E11.9, Z79.4 50 each 11  . Lancets (FREESTYLE) lancets Use as instructed 100 each 0  . Lancets 30G MISC     . Multiple Vitamin (MULTIVITAMIN WITH MINERALS) TABS tablet Take 1 tablet by mouth daily. Centrum    . ofloxacin (FLOXIN) 0.3 % OTIC solution Place 5 drops into the left ear daily. 5 mL 0  . Polyethyl Glycol-Propyl Glycol (SYSTANE OP) Place 1 drop into both eyes as needed (for dryness).     . torsemide (DEMADEX) 100 MG tablet Take 50 mg by mouth daily.    [name] torsemide (DEMADEX) 20 MG tablet Take 1 tablet (20 mg total) by mouth daily. (Patient not taking: Reported on 04/09/2020) 30 tablet 3  . traZODone (DESYREL) 50 MG tablet Take 0.5-1 tablets (25-50 mg total) by mouth at bedtime as needed for sleep. 30 tablet 3  . Wound Dressings (MEDIHONEY WOUND/BURN DRESSING) GEL Apply to affected are 3 times a week, and cover with sterile dressing. 30 mL 1   No current facility-administered medications on file prior to visit.   Allergies  Allergen Reactions  . Ace Inhibitors Itching and Cough  . Naproxen Anaphylaxis, Shortness Of Breath and Other (See Comments)    Throat swells. cannot breathe, and causes GI distress      Objective: There were no vitals filed for this visit.  General: Patient is awake, alert, oriented x 3 and in no acute distress.  Dermatology: Skin is warm and dry bilateral with a dry blood blister at left plantar midfoot at area of previous ulceration with mild edema at the affected area. There is no malodor.  There is also clear blisters noted to the right anterior shin with no signs of infection.  Nails x 10 thickened and with changes consistent with onychomycosis   Vascular: Dorsalis Pedis pulse = 1/4 Bilateral,  Posterior Tibial pulse = 1/4 Bilateral,  Capillary Fill Time < 5 seconds 1+ pitting edema bilateral as noted above.  Neurologic: Protective sensation diminished  bilateral.  Musculosketal: There is Charcot deformity noted on left foot.  Mild pain at lateral aspect on left foot unchanged from prior.    dNo results for input(s): GRAMSTAIN, LABORGA in the last 8760 hours.  Assessment and Plan:  Problem List Items Addressed This Visit      Endocrine   Diabetic foot ulcer ([place]) - Primary   Relevant Orders   WOUND CULTURE   Charcot foot due to diabetes mellitus ([place])    Other Visit Diagnoses    PAD (peripheral artery disease) ([place])       Infected blister of left foot, initial encounter         -Examined patient -Using a 15 blade lanced blister/ulcerated area at the left plantar lateral midfoot and applied Betadine and dry dressing wound culture was obtained will call patient if need to change antibiotic however at this time sent Augmentin advised patient to continue with daily dressing changes  of Betadine to the area and to return to using his postoperative shoe or cam boot -Advised patient at this time we will hold off on dispensing any type of diabetic shoe or insole I also check with Dawn who reports that his shoes have not arrived yet and still are awaiting shipment -Return to office in 1 week for follow-up evaluation of left foot ulcer -As a courtesy I also evaluated the patient's right shin and there was some fluid blisters noted there that were lanced and applied antibiotic cream and Band-Aid and advised patient to continue with the same -Return in 1 week or sooner if any issues arise.   Landis Martins, DPM

## 2020-04-16 NOTE — H&P (Signed)
History and Physical Interval Note:  04/16/2020 7:19 AM  [name]  has presented today for surgery, with the diagnosis of pvd.  The various methods of treatment have been discussed with the patient and family. After consideration of risks, benefits and other options for treatment, the patient has consented to  Procedure(s): ABDOMINAL AORTOGRAM W/LOWER EXTREMITY (N/A) as a surgical intervention.  The patient's history has been reviewed, patient examined, no change in status, stable for surgery.  I have reviewed the patient's chart and labs.  Questions were answered to the patient's satisfaction.    Aortogram, left leg arteriogram with possible intervention.  Left plantar foot wound.   J   Patient name: [name]      MRN: [id number]        DOB: 02/16/1951          Sex: male  REASON FOR VISIT: Follow-up of left lower extremity, left foot wound  HPI: [name] is a 68 y.o. male with history of diabetes, hypertension,hyperlipidemia,stage III chronic kidney disease that presents for scheduled follow-up of his left lower extremity.  He previously underwent left anterior tibial angioplasty on 05/30/2019 for CLI with tissue loss.  He was not seen in follow-up that time.  Ultimately states that this wound healed but he is now being seen for another wound on the plantar surface of his left foot.  He feels the wound is making significant progress and is being followed by podiatry.  No significant rest pain.  Still taking plavix.        Past Medical History:  Diagnosis Date  . Cellulitis and abscess of face 10/11/2007   Qualifier: Diagnosis of  By: [place] MD, Elizabeth    . Diabetic retinopathy (HCC)   . Diabetic retinopathy associated with type 2 diabetes mellitus (HCC) 09/13/2014   He works for [employer]  . Fluid overload 09/03/2012   Post op  . GERD (gastroesophageal reflux disease)   . Visual impairment          Past Surgical  History:  Procedure Laterality Date  . ABDOMINAL AORTOGRAM W/LOWER EXTREMITY Left 05/30/2019   Procedure: ABDOMINAL AORTOGRAM W/LOWER EXTREMITY;  Surgeon: ,  J, MD;  Location: [facility];  Service: Cardiovascular;  Laterality: Left;  . CATARACT EXTRACTION W/ INTRAOCULAR LENS  IMPLANT, BILATERAL    . EYE SURGERY Bilateral    "laser OR for diabetic retinopathy"  . INGUINAL HERNIA REPAIR     /notes 07/12/2010), "don't remember which side"  . LAPAROTOMY  08/23/2012   Procedure: EXPLORATORY LAPAROTOMY;  Surgeon: Brian Layton, DO;  Location: [facility];  Service: General;  Laterality: N/A;  exploratory laparotomy with lysis of adhesions  . LYSIS OF ADHESION  08/23/2012   Procedure: LYSIS OF ADHESION;  Surgeon: Brian Layton, DO;  Location: [facility];  Service: General;;  . PERIPHERAL VASCULAR BALLOON ANGIOPLASTY  05/30/2019   Procedure: PERIPHERAL VASCULAR BALLOON ANGIOPLASTY;  Surgeon: ,  J, MD;  Location: [facility];  Service: Cardiovascular;;  left anterior tibial  . ROBOT ASSISTED LAPAROSCOPIC RADICAL PROSTATECTOMY  2000's   "had to finish manually after machine broke"  . SHOULDER SURGERY  1970's   separation; from playing football"         Family History  Problem Relation Age of Onset  . Diabetes Mellitus II Mother   . Colon cancer Neg Hx     SOCIAL HISTORY: Social History        Tobacco Use  .   Smoking status: Former Smoker    Packs/day: 2.50    Years: 3.00    Pack years: 7.50    Types: Cigarettes    Quit date: 11/09/1966    Years since quitting: 53.4  . Smokeless tobacco: Never Used  Substance Use Topics  . Alcohol use: Yes    Comment: 04/08/2015 "maybe a beer/ or 2 or a glass of wine monthly"         Allergies  Allergen Reactions  . Ace Inhibitors Itching and Cough  . Naproxen Anaphylaxis, Shortness Of Breath and Other (See Comments)    Throat swells. cannot breathe, and causes GI distress           Current Outpatient Medications  Medication Sig Dispense Refill  . amLODipine (NORVASC) 10 MG tablet Take 1 tablet (10 mg total) by mouth daily. 90 tablet 3  . aspirin 81 MG chewable tablet Chew 1 tablet (81 mg total) by mouth every morning. 90 tablet 3  . atorvastatin (LIPITOR) 40 MG tablet Take 1 tablet (40 mg total) by mouth every morning. 90 tablet 1  . blood glucose meter kit and supplies KIT Dispense based on patient and insurance preference. Use up to four times daily as directed. 1 each 1  . blood glucose meter kit and supplies Dispense based on patient and insurance preference. Use up to four times daily as directed. (FOR ICD-10 E10.9, E11.9). 1 each 0  . Blood Glucose Monitoring Suppl (RELION CONFIRM GLUCOSE MONITOR) w/Device KIT Use to check blood sugar 3 times a day. 1 kit 0  . clopidogrel (PLAVIX) 75 MG tablet Take 1 tablet (75 mg total) by mouth daily with breakfast. 30 tablet 3  . Dulaglutide (TRULICITY) 0.75 MG/0.5ML SOPN Inject 0.5 mLs (0.75 mg total) into the skin once a week. 6 mL 0  . gabapentin (NEURONTIN) 300 MG capsule Take 1 capsule (300 mg total) by mouth 2 (two) times daily. 60 capsule 5  . hydrALAZINE (APRESOLINE) 10 MG tablet Take 1 tablet (10 mg total) by mouth every 8 (eight) hours. 270 tablet 1  . Insulin Glargine (BASAGLAR KWIKPEN) 100 UNIT/ML Inject 0.05 mLs (5 Units total) into the skin at bedtime. 4.5 mL 0  . Insulin Pen Needle 32G X 6 MM MISC Use new needle with each basaglar injection. Dx E11.9, Z79.4 50 each 11  . Lancets (FREESTYLE) lancets Use as instructed 100 each 0  . Lancets 30G MISC     . Multiple Vitamins-Minerals (CENTRUM SILVER 50+MEN) TABS Take 1 tablet by mouth daily with breakfast.    . ofloxacin (FLOXIN) 0.3 % OTIC solution Place 5 drops into the left ear daily. 5 mL 0  . Polyethyl Glycol-Propyl Glycol (SYSTANE OP) Place 1 drop into both eyes as needed (for dryness).     . torsemide (DEMADEX) 20 MG tablet Take 1 tablet (20 mg  total) by mouth daily. 30 tablet 3  . traZODone (DESYREL) 50 MG tablet Take 0.5-1 tablets (25-50 mg total) by mouth at bedtime as needed for sleep. 30 tablet 3  . Wound Dressings (MEDIHONEY WOUND/BURN DRESSING) GEL Apply to affected are 3 times a week, and cover with sterile dressing. 30 mL 1   No current facility-administered medications for this visit.    REVIEW OF SYSTEMS:  [X] denotes positive finding, [ ] denotes negative finding Cardiac  Comments:  Chest pain or chest pressure:    Shortness of breath upon exertion:    Short of breath when lying flat:    Irregular heart   rhythm:        Vascular    Pain in calf, thigh, or hip brought on by ambulation:    Pain in feet at night that wakes you up from your sleep:     Blood clot in your veins:    Leg swelling:         Pulmonary    Oxygen at home:    Productive cough:     Wheezing:         Neurologic    Sudden weakness in arms or legs:     Sudden numbness in arms or legs:     Sudden onset of difficulty speaking or slurred speech:    Temporary loss of vision in one eye:     Problems with dizziness:         Gastrointestinal    Blood in stool:     Vomited blood:         Genitourinary    Burning when urinating:     Blood in urine:        Psychiatric    Major depression:         Hematologic    Bleeding problems:    Problems with blood clotting too easily:        Skin    Rashes or ulcers:        Constitutional    Fever or chills:      PHYSICAL EXAM:    Vitals:   04/07/20 1149  BP: 135/62  Pulse: 62  Resp: 18  Temp: (!) 97 F (36.1 C)  TempSrc: Temporal  SpO2: 100%  Weight: 167 lb (75.8 kg)  Height: 5' 6" (1.676 m)    GENERAL: The patient is a well-nourished male, in no acute distress. The vital signs are documented above. CARDIAC: There is a regular rate and rhythm.  VASCULAR:  Palpable femoral  pulses bilateral groins Palpable right DP/PT No palpable left pedal pulses Large left plantar ulcer as pictured below PULMONARY: There is good air exchange bilaterally without wheezing or rales. ABDOMEN: Soft and non-tender with normal pitched bowel sounds.  MUSCULOSKELETAL: There are no major deformities or cyanosis. NEUROLOGIC: No focal weakness or paresthesias are detected.       DATA:   ABIs today show 1.17 on the right triphasic and 1.09 on the left but monophasic  Left leg arterial duplex shows a high-grade distal SFA stenosis with a velocity of 476 as well as a distal pop stenosis of 50 to 74% velocity 298 and monophasic runoff in the tibials   Assessment/Plan:  69 year old male presents with critical limb ischemia left lower extremity with tissue loss.  He previously underwent a left AT angioplasty last year on 05/30/2019.  I have not seen him in follow-up this year or last year after his intervention and he now has a large ulcer on the bottom of the left foot as pictured above.  I have advised that we proceed with another left lower extremity arteriogram and possible intervention given the size of the ulcer as noted above.  He has a high-grade SFA stenosis in the setting of known tibial disease.  We will get him scheduled soon as possible for left leg intervention.  Risks and benefits were also discussed in detail including risk of anesthesia, groin access, vessel injury, limb loss etc.  Discussed that I think intervention would give him the best chance of healing the wound.  He should continue follow-up with podiatry.   Marty Heck, MD  [facility] Office: [phone]

## 2020-04-17 ENCOUNTER — Ambulatory Visit: Payer: Medicare Other | Admitting: Podiatry

## 2020-04-21 ENCOUNTER — Ambulatory Visit: Payer: Medicare Other | Admitting: Family Medicine

## 2020-04-22 ENCOUNTER — Encounter: Payer: Self-pay | Admitting: Family Medicine

## 2020-04-23 ENCOUNTER — Other Ambulatory Visit: Payer: Self-pay

## 2020-04-23 ENCOUNTER — Ambulatory Visit (INDEPENDENT_AMBULATORY_CARE_PROVIDER_SITE_OTHER): Payer: Medicare Other | Admitting: Sports Medicine

## 2020-04-23 ENCOUNTER — Encounter: Payer: Self-pay | Admitting: Sports Medicine

## 2020-04-23 VITALS — Temp 97.0°F

## 2020-04-23 DIAGNOSIS — E11621 Type 2 diabetes mellitus with foot ulcer: Secondary | ICD-10-CM

## 2020-04-23 DIAGNOSIS — L97421 Non-pressure chronic ulcer of left heel and midfoot limited to breakdown of skin: Secondary | ICD-10-CM

## 2020-04-23 DIAGNOSIS — E11610 Type 2 diabetes mellitus with diabetic neuropathic arthropathy: Secondary | ICD-10-CM

## 2020-04-23 DIAGNOSIS — I739 Peripheral vascular disease, unspecified: Secondary | ICD-10-CM | POA: Diagnosis not present

## 2020-04-23 NOTE — Progress Notes (Signed)
Subjective: [name] is a 69 y.o. male patient seen in office for follow-up evaluation of ulceration of the left foot.  Patient reports that things are still the same wife has been helping with dressing changes.  Patient has no other pedal complaints at this time.  Last A1c not recorded    FBS was not recorded   Admits that his PCP is working on trying to get his Kidneys under control.   Patient Active Problem List   Diagnosis Date Noted  . Sudden hearing loss, left 01/27/2020  . Tympanic membrane rupture 01/08/2020  . Dyslipidemia 08/19/2019  . Hyperkalemia   . Acute diastolic HF (heart failure) ([place])   . Heart block AV complete ([place])   . Acute renal failure with acute tubular necrosis superimposed on stage 4 chronic kidney disease ([place])   . Optic atrophy of both eyes 06/13/2019  . H/O small bowel obstruction 05/17/2019  . PVD (peripheral vascular disease) ([place]) 05/14/2019  . Skin ulcer of left foot, limited to breakdown of skin ([place])   . Charcot foot due to diabetes mellitus ([place])   . Charcot's joint of foot, left   . SBO (small bowel obstruction) ([place]) 05/06/2019  . Complete heart block ([place]) 05/06/2019  . Moderate protein-calorie malnutrition ([place])   . Diabetic foot ulcer ([place]) 10/19/2018  . Diabetic foot infection ([place])   . Pseudophakia, both eyes 10/04/2018  . CRI (chronic renal insufficiency), stage 4 (severe) ([place]) 09/27/2018  . Adrenal insufficiency ([place]) 04/09/2015  . Peripheral edema 09/17/2014  . Shortness of breath 09/17/2014  . Bronchospasm, acute 09/16/2014  . Partial small bowel obstruction ([place]) 09/16/2014  . Hyperglycemia   . Diabetic retinopathy associated with type 2 diabetes mellitus ([place]) 09/13/2014  . Abdominal pain 09/13/2014  . Cough 11/15/2012  . Pharyngitis 09/14/2012  . Fluid overload 09/03/2012  . Ileus following gastrointestinal surgery ([place]) 09/03/2012  . Hypokalemia 08/30/2012  . Small bowel obstruction s/p LOA [id number] 08/21/2012   . Abnormal EKG 08/21/2012  . Chest pain 08/21/2012  . Macular degeneration (senile) of retina 12/15/2011  . Lens replaced 12/15/2011  . Vitreous hemorrhage ([place]) 12/15/2011  . Essential hypertension 08/19/2008  . Regional enteritis of small intestine ([place]) 01/03/2008  . ERECTILE DYSFUNCTION, ORGANIC 01/03/2008  . Sarcoidosis 07/19/2007  . Type 2 diabetes, uncontrolled, with retinopathy ([place]) 07/19/2007  . CERUMEN IMPACTION, BILATERAL 07/19/2007  . URINARY INCONTINENCE, STRESS, MALE 08/17/2006  . PROSTATE CANCER, HX OF 08/17/2006   Current Outpatient Medications on File Prior to Visit  Medication Sig Dispense Refill  . amLODipine (NORVASC) 10 MG tablet Take 1 tablet (10 mg total) by mouth daily. 90 tablet 3  . amoxicillin-clavulanate (AUGMENTIN) 875-125 MG tablet Take 1 tablet by mouth 2 (two) times daily. 28 tablet 0  . aspirin 81 MG chewable tablet Chew 1 tablet (81 mg total) by mouth every morning. (Patient taking differently: Chew 81 mg by mouth daily. ) 90 tablet 3  . atorvastatin (LIPITOR) 40 MG tablet Take 1 tablet (40 mg total) by mouth every morning. 90 tablet 1  . blood glucose meter kit and supplies KIT Dispense based on patient and insurance preference. Use up to four times daily as directed. 1 each 1  . blood glucose meter kit and supplies Dispense based on patient and insurance preference. Use up to four times daily as directed. (FOR ICD-10 E10.9, E11.9). 1 each 0  . Blood Glucose Monitoring Suppl (RELION CONFIRM GLUCOSE MONITOR) w/Device KIT Use to check blood sugar 3 times a  day. 1 kit 0  . clopidogrel (PLAVIX) 75 MG tablet Take 1 tablet (75 mg total) by mouth daily with breakfast. 30 tablet 3  . gabapentin (NEURONTIN) 300 MG capsule Take 1 capsule (300 mg total) by mouth 2 (two) times daily. 60 capsule 5  . hydrALAZINE (APRESOLINE) 10 MG tablet Take 1 tablet (10 mg total) by mouth every 8 (eight) hours. 270 tablet 1  . Insulin Glargine (BASAGLAR KWIKPEN) 100 UNIT/ML Inject  0.05 mLs (5 Units total) into the skin at bedtime. 4.5 mL 0  . Insulin Pen Needle 32G X 6 MM MISC Use new needle with each basaglar injection. Dx E11.9, Z79.4 50 each 11  . Lancets (FREESTYLE) lancets Use as instructed 100 each 0  . Lancets 30G MISC     . Multiple Vitamin (MULTIVITAMIN WITH MINERALS) TABS tablet Take 1 tablet by mouth daily. Centrum    . ofloxacin (FLOXIN) 0.3 % OTIC solution Place 5 drops into the left ear daily. 5 mL 0  . Polyethyl Glycol-Propyl Glycol (SYSTANE OP) Place 1 drop into both eyes as needed (for dryness).     . torsemide (DEMADEX) 100 MG tablet Take 50 mg by mouth daily.    [name] torsemide (DEMADEX) 20 MG tablet Take 1 tablet (20 mg total) by mouth daily. (Patient not taking: Reported on 04/09/2020) 30 tablet 3  . traZODone (DESYREL) 50 MG tablet Take 0.5-1 tablets (25-50 mg total) by mouth at bedtime as needed for sleep. 30 tablet 3  . Wound Dressings (MEDIHONEY WOUND/BURN DRESSING) GEL Apply to affected are 3 times a week, and cover with sterile dressing. 30 mL 1   No current facility-administered medications on file prior to visit.   Allergies  Allergen Reactions  . Ace Inhibitors Itching and Cough  . Naproxen Anaphylaxis, Shortness Of Breath and Other (See Comments)    Throat swells. cannot breathe, and causes GI distress      Objective: There were no vitals filed for this visit.  General: Patient is awake, alert, oriented x 3 and in no acute distress.  Dermatology: Skin is warm and dry bilateral with a dry blood blister at left plantar midfoot at area of previous ulceration with mild edema at the affected area that measures plantarly 1x1cm with a granular base. There is no malodor. No acute signs of infection.   Nails x 10 thickened and with changes consistent with onychomycosis   Vascular: Dorsalis Pedis pulse = 1/4 Bilateral,  Posterior Tibial pulse = 1/4 Bilateral,  Capillary Fill Time < 5 seconds 1+ pitting edema bilateral as noted  above.  Neurologic: Protective sensation diminished bilateral.  Musculosketal: There is Charcot deformity noted on left foot.  Mild pain at lateral aspect on left foot unchanged from prior.    dNo results for input(s): GRAMSTAIN, LABORGA in the last 8760 hours.  Assessment and Plan:  Problem List Items Addressed This Visit      Endocrine   Diabetic foot ulcer ([place]) - Primary   Charcot foot due to diabetes mellitus ([place])    Other Visit Diagnoses    PAD (peripheral artery disease) ([place])         -Examined patient -Cleanse ulceration and apply Medihoney plantarly and Betadine to be lateral foot on the left covered with dry dressing advised patient to continue with the same at minimum every other day -Continue with Augmentin until completed -Advised patient at this time we will hold off on dispensing any type of diabetic shoe or insole since we are still  awaiting his shoes we will call him when things have arrived -Return to office if fails to continue to improve however patient is moving out of state and will plan to get care and [place] for his foot ulcer I advised patient to take my card and to have his new provider to contact me if they have any questions or concerns  Landis Martins, DPM

## 2020-04-24 ENCOUNTER — Other Ambulatory Visit: Payer: Self-pay

## 2020-04-24 ENCOUNTER — Encounter: Payer: Self-pay | Admitting: Family Medicine

## 2020-04-24 ENCOUNTER — Ambulatory Visit (INDEPENDENT_AMBULATORY_CARE_PROVIDER_SITE_OTHER): Payer: Medicare Other | Admitting: Family Medicine

## 2020-04-24 VITALS — BP 140/75 | HR 69 | Temp 98.8°F | Ht 66.0 in | Wt 158.5 lb

## 2020-04-24 DIAGNOSIS — E785 Hyperlipidemia, unspecified: Secondary | ICD-10-CM

## 2020-04-24 DIAGNOSIS — E11621 Type 2 diabetes mellitus with foot ulcer: Secondary | ICD-10-CM

## 2020-04-24 DIAGNOSIS — N184 Chronic kidney disease, stage 4 (severe): Secondary | ICD-10-CM

## 2020-04-24 DIAGNOSIS — H9122 Sudden idiopathic hearing loss, left ear: Secondary | ICD-10-CM

## 2020-04-24 DIAGNOSIS — E1165 Type 2 diabetes mellitus with hyperglycemia: Secondary | ICD-10-CM

## 2020-04-24 DIAGNOSIS — E11610 Type 2 diabetes mellitus with diabetic neuropathic arthropathy: Secondary | ICD-10-CM

## 2020-04-24 DIAGNOSIS — E11319 Type 2 diabetes mellitus with unspecified diabetic retinopathy without macular edema: Secondary | ICD-10-CM

## 2020-04-24 DIAGNOSIS — I739 Peripheral vascular disease, unspecified: Secondary | ICD-10-CM

## 2020-04-24 DIAGNOSIS — L97429 Non-pressure chronic ulcer of left heel and midfoot with unspecified severity: Secondary | ICD-10-CM

## 2020-04-24 DIAGNOSIS — IMO0002 Reserved for concepts with insufficient information to code with codable children: Secondary | ICD-10-CM

## 2020-04-24 MED ORDER — TRAZODONE HCL 50 MG PO TABS
25.0000 mg | ORAL_TABLET | Freq: Every evening | ORAL | 3 refills | Status: AC | PRN
Start: 2020-04-24 — End: ?

## 2020-04-24 MED ORDER — TORSEMIDE 20 MG PO TABS
20.0000 mg | ORAL_TABLET | Freq: Every day | ORAL | 3 refills | Status: DC
Start: 2020-04-24 — End: 2020-04-24

## 2020-04-24 MED ORDER — TORSEMIDE 20 MG PO TABS: 20.0000 mg | ORAL_TABLET | Freq: Every day | ORAL | 3 refills | Status: AC

## 2020-04-24 MED ORDER — CLOPIDOGREL BISULFATE 75 MG PO TABS: 75.0000 mg | ORAL_TABLET | Freq: Every day | ORAL | 1 refills | Status: AC

## 2020-04-24 MED ORDER — ATORVASTATIN CALCIUM 40 MG PO TABS: 40.0000 mg | ORAL_TABLET | Freq: Every morning | ORAL | 1 refills | Status: AC

## 2020-04-24 NOTE — Progress Notes (Signed)
9/10/20213:18 PM  [name] 1950/11/06, 69 y.o., male [id number]  Chief Complaint  Patient presents with   Diabetes    follow up, wants foot checked up    HPI:   Patient is a 69 y.o. male with past medical history significant for HTN, CZYS,[id number] complications,CKD4,HLP, PADwho presents today for routine followup  Last OV a month ago - started lantus 5 units daily, referred to renal  Had abd aortogram with LE on sept 2nd for critical limb ischemia - BLE no flow limiting stenosis seen, no interventions done, cont plavix and ASA  Had intraTM injection, LEFT, on sept 2nd, repeat hearing test in 2 weeks  Saw podiatry yesterday, on augmentin, using medihoney   Patient here to request refills of medications and medical records He has moved to [place] and will be establishing care there  Lab Results  Component Value Date   HGBA1C 9.2 (A) 03/20/2020   HGBA1C 12.8 (H) 12/31/2019   HGBA1C  11/20/2019     Comment:     >15   HGBA1C >15 11/20/2019   Lab Results  Component Value Date   MICROALBUR 6.46 (H) 08/19/2008   LDLCALC 94 03/20/2020   CREATININE 2.90 (H) 04/16/2020    Depression screen PHQ 2/9 03/20/2020 02/03/2020 01/16/2020  Decreased Interest 0 0 0  Down, Depressed, Hopeless 0 0 0  PHQ - 2 Score 0 0 0  Some recent data might be hidden    Fall Risk  03/20/2020 02/03/2020 01/16/2020 06/21/2019 06/03/2019  Falls in the past year? 0 0 0 0 0  Number falls in past yr: 0 0 - 0 0  Injury with Fall? 0 0 - 0 0  Risk for fall due to : No Fall Risks - - - -  Follow up Falls evaluation completed Falls evaluation completed;Education provided Falls evaluation completed Falls evaluation completed -     Allergies  Allergen Reactions   Ace Inhibitors Itching and Cough   Naproxen Anaphylaxis, Shortness Of Breath and Other (See Comments)    Throat swells. cannot breathe, and causes GI distress    Prior to Admission medications   Medication Sig Start Date End Date Taking?  Authorizing Provider  amLODipine (NORVASC) 10 MG tablet Take 1 tablet (10 mg total) by mouth daily. 12/26/19  Yes O'Neal, Cassie Freer, MD  amoxicillin-clavulanate (AUGMENTIN) 875-125 MG tablet Take 1 tablet by mouth 2 (two) times daily. 04/16/20  Yes Landis Martins, DPM  aspirin 81 MG chewable tablet Chew 1 tablet (81 mg total) by mouth every morning. Patient taking differently: Chew 81 mg by mouth daily.  09/25/19  Yes O'Neal, Cassie Freer, MD  atorvastatin (LIPITOR) 40 MG tablet Take 1 tablet (40 mg total) by mouth every morning. 12/26/19  Yes O'Neal, Cassie Freer, MD  blood glucose meter kit and supplies KIT Dispense based on patient and insurance preference. Use up to four times daily as directed. 09/10/19  Yes Philemon Kingdom, MD  blood glucose meter kit and supplies Dispense based on patient and insurance preference. Use up to four times daily as directed. (FOR ICD-10 E10.9, E11.9). 01/03/20  Yes Bhagat, Bhavinkumar, PA  Blood Glucose Monitoring Suppl (RELION CONFIRM GLUCOSE MONITOR) w/Device KIT Use to check blood sugar 3 times a day. 09/10/19  Yes Philemon Kingdom, MD  clopidogrel (PLAVIX) 75 MG tablet Take 1 tablet (75 mg total) by mouth daily with breakfast. 01/09/20  Yes [name], MD  gabapentin (NEURONTIN) 300 MG capsule Take 1 capsule (300 mg total) by mouth  2 (two) times daily. 03/20/20  Yes [name], MD  hydrALAZINE (APRESOLINE) 10 MG tablet Take 1 tablet (10 mg total) by mouth every 8 (eight) hours. 02/20/20  Yes [name], MD  Insulin Glargine [employer]estive [employer] Complexinc KWIKPEN) 100 UNIT/ML Inject 0.05 mLs (5 Units total) into the skin at bedtime. 03/20/20 06/18/20 Yes [name], MD  Insulin Pen Needle 32G X 6 MM MISC Use new needle with each basaglar injection. Dx E11.9, Z79.4 03/20/20  Yes [name], MD  Lancets (FREESTYLE) lancets Use as instructed 09/18/14  Yes Delfina Redwood, MD  Lancets 30G MISC  04/06/16  Yes [provider]  Multiple Vitamin (MULTIVITAMIN  WITH MINERALS) TABS tablet Take 1 tablet by mouth daily. Centrum   Yes [provider]  ofloxacin (FLOXIN) 0.3 % OTIC solution Place 5 drops into the left ear daily. 01/07/20  Yes Horton, Barbette Hair, MD  Polyethyl Glycol-Propyl Glycol (SYSTANE OP) Place 1 drop into both eyes as needed (for dryness).    Yes [provider]  torsemide (DEMADEX) 100 MG tablet Take 50 mg by mouth daily. 04/01/20  Yes [provider]  torsemide (DEMADEX) 20 MG tablet Take 1 tablet (20 mg total) by mouth daily. 01/03/20  Yes Bhagat, Bhavinkumar, PA  traZODone (DESYREL) 50 MG tablet Take 0.5-1 tablets (25-50 mg total) by mouth at bedtime as needed for sleep. 01/09/20  Yes [name], MD  Wound Dressings (MEDIHONEY WOUND/BURN DRESSING) GEL Apply to affected are 3 times a week, and cover with sterile dressing. 08/21/19  Yes Landis Martins, DPM    Past Medical History:  Diagnosis Date   Cellulitis and abscess of face 10/11/2007   Qualifier: Diagnosis of  By: Amil Amen MD, Elizabeth     Diabetic retinopathy [facility])    Diabetic retinopathy associated with type 2 diabetes mellitus ([place]) 09/13/2014   He works for [employer] for the [employer] overload 09/03/2012   Post op   GERD (gastroesophageal reflux disease)    Visual impairment     Past Surgical History:  Procedure Laterality Date   ABDOMINAL AORTOGRAM W/LOWER EXTREMITY Left 05/30/2019   Procedure: ABDOMINAL AORTOGRAM W/LOWER EXTREMITY;  Surgeon: Marty Heck, MD;  Location: [place] [facility];  Service: Cardiovascular;  Laterality: Left;   ABDOMINAL AORTOGRAM W/LOWER EXTREMITY N/A 04/16/2020   Procedure: ABDOMINAL AORTOGRAM W/LOWER EXTREMITY;  Surgeon: Marty Heck, MD;  Location: [place] [facility];  Service: Cardiovascular;  Laterality: N/A;   CATARACT EXTRACTION W/ INTRAOCULAR LENS  IMPLANT, BILATERAL     EYE SURGERY Bilateral    "laser OR for diabetic retinopathy"   INGUINAL HERNIA REPAIR     [name]  07/12/2010), "don't remember which side"   LAPAROTOMY  08/23/2012   Procedure: EXPLORATORY LAPAROTOMY;  Surgeon: [name], DO;  Location: [facility];  Service: General;  Laterality: N/A;  exploratory laparotomy with lysis of adhesions   LYSIS OF ADHESION  08/23/2012   Procedure: LYSIS OF ADHESION;  Surgeon: [name], DO;  Location: [facility];  Service: General;;   PERIPHERAL VASCULAR BALLOON ANGIOPLASTY  05/30/2019   Procedure: PERIPHERAL VASCULAR BALLOON ANGIOPLASTY;  Surgeon: Marty Heck, MD;  Location: [place] [facility];  Service: Cardiovascular;;  left anterior tibial   ROBOT ASSISTED LAPAROSCOPIC RADICAL PROSTATECTOMY  2000's   "had to finish manually after machine broke"   SHOULDER SURGERY  1970's   separation; from playing football"    Social History   Tobacco Use   Smoking status: Former  Smoker    Packs/day: 2.50    Years: 3.00    Pack years: 7.50    Types: Cigarettes    Quit date: 11/09/1966    Years since quitting: 53.4   Smokeless tobacco: Never Used  Substance Use Topics   Alcohol use: Yes    Comment: 04/08/2015 "maybe a beer/ or 2 or a glass of wine monthly"    Family History  Problem Relation Age of Onset   Diabetes Mellitus II Mother    Colon cancer Neg Hx     ROS Per hpi  OBJECTIVE:  Today's Vitals   04/24/20 1503  BP: 140/75  Pulse: 69  Temp: 98.8 F (37.1 C)  SpO2: 99%  Weight: 158 lb 8 oz (71.9 kg)  Height: '5\' 6"'  (1.676 m)   Body mass index is 25.58 kg/m.   Physical Exam  GEN: AAOX3, NAD      4x3cm, LEFT foot  No results found for this or any previous visit (from the past 24 hour(s)).  No results found.   ASSESSMENT and PLAN  1. Type 2 diabetes, uncontrolled, with retinopathy ([place])  2. CRI (chronic renal insufficiency), stage 4 (severe) (HCC)  3. Charcot foot due to diabetes mellitus ([place])  4. Diabetic ulcer of left midfoot associated with type 2 diabetes mellitus, unspecified ulcer stage ([place])  5. Sudden  hearing loss, left  6. Dyslipidemia  7. PVD (peripheral vascular disease) ([place])  Meds refilled. Advise needs to seek PCP, ENT, podiatry/ortho, renal.   Other orders - atorvastatin (LIPITOR) 40 MG tablet; Take 1 tablet (40 mg total) by mouth every morning. - clopidogrel (PLAVIX) 75 MG tablet; Take 1 tablet (75 mg total) by mouth daily with breakfast. - traZODone (DESYREL) 50 MG tablet; Take 0.5-1 tablets (25-50 mg total) by mouth at bedtime as needed for sleep. - torsemide (DEMADEX) 20 MG tablet; Take 1 tablet (20 mg total) by mouth daily.  No follow-ups on file.    [name], MD Primary Care at [facility], [place] [address] Ph.  [phone] Fax [phone]

## 2020-04-24 NOTE — Patient Instructions (Addendum)
  You will need to get a PCP, ENT, podiatrist or orthopedist, and nephrologist   If you have lab work done today you will be contacted with your lab results within the next 2 weeks.  If you have not heard from [place] then please contact us. The fastest way to get your results is to register for My Chart.   IF you received an x-ray today, you will receive an invoice from [facility]. Please contact [facility] at [phone] with questions or concerns regarding your invoice.   IF you received labwork today, you will receive an invoice from [facility]. Please contact [facility] at [phone] with questions or concerns regarding your invoice.   Our billing staff will not be able to assist you with questions regarding bills from these companies.  You will be contacted with the lab results as soon as they are available. The fastest way to get your results is to activate your My Chart account. Instructions are located on the last page of this paperwork. If you have not heard from [place] regarding the results in 2 weeks, please contact this office.

## 2020-05-14 ENCOUNTER — Telehealth: Payer: Self-pay | Admitting: Sports Medicine

## 2020-05-14 NOTE — Telephone Encounter (Signed)
Pt left message asking for Liliane Channel to call to discuss pts diabetic shoe.   I returned call and mailbox is full. Shoe is not in yet still on back order.  Pt returned my call and I explained shoe is still on back order and I would call when they come in.

## 2020-05-29 ENCOUNTER — Telehealth: Payer: Self-pay | Admitting: Sports Medicine

## 2020-05-29 NOTE — Telephone Encounter (Signed)
pts shoes finally arrived.  I called pt to schedule an appt to pick up the brace and shoes and [place] and I explained we could not do that per medicare quidelines. Pt is going to talk to wife and call back when he can schedule an appt to pick them up.Marland KitchenMarland Kitchen

## 2020-06-05 NOTE — Telephone Encounter (Signed)
error 

## 2020-06-12 LAB — WOUND CULTURE
MICRO NUMBER:: 10905633
SPECIMEN QUALITY:: ADEQUATE

## 2020-06-12 LAB — HOUSE ACCOUNT TRACKING

## 2020-06-22 ENCOUNTER — Ambulatory Visit: Payer: Self-pay | Admitting: Family Medicine

## 2020-07-15 ENCOUNTER — Ambulatory Visit: Payer: Medicare Other | Admitting: Podiatry

## 2020-07-22 ENCOUNTER — Encounter: Payer: MEDICARE | Attending: Family | Primary: Geriatric Medicine

## 2020-07-23 ENCOUNTER — Inpatient Hospital Stay: Admit: 2020-07-23 | Payer: MEDICARE | Primary: Geriatric Medicine

## 2020-07-23 ENCOUNTER — Ambulatory Visit
Admit: 2020-07-23 | Discharge: 2020-07-23 | Payer: MEDICARE | Attending: Geriatric Medicine | Primary: Geriatric Medicine

## 2020-07-23 ENCOUNTER — Ambulatory Visit: Attending: Geriatric Medicine | Primary: Geriatric Medicine

## 2020-07-23 DIAGNOSIS — E1121 Type 2 diabetes mellitus with diabetic nephropathy: Secondary | ICD-10-CM

## 2020-07-23 LAB — AMB POC HEMOGLOBIN A1C
Hemoglobin A1C, POC: 7.2 %
Hemoglobin A1c (POC): 7.2 %

## 2020-07-23 LAB — AMB POC GLUCOSE TEST: Glucose dose-GTT: 132

## 2020-07-23 MED ORDER — GABAPENTIN 300 MG CAP
300 mg | ORAL_CAPSULE | Freq: Three times a day (TID) | ORAL | 0 refills | Status: DC
Start: 2020-07-23 — End: 2020-10-13

## 2020-07-23 NOTE — Progress Notes (Signed)
Progress Note    Patient: Hunter Higgins               Sex: male                  Date of Birth:  1950-10-24      Age:  69 y.o.                    HPI:     Hunter Higgins is a 69 y.o. male who has been seen for establishment as new patient . He is treated for DM , hypertension , DM foot ulcer . He did not bring any records . He moved here from [facility] . He has seen podiatrist in [facility] in last week .[name]    Past Medical History:   Diagnosis Date   ??? CAD (coronary artery disease)    ??? Diabetes ([place])    ??? Diabetic foot ulcer ([place])     Left   ??? Hypertension    ??? Retinopathy    ??? Vascular complication    ??? Visual impairment        Past Surgical History:   Procedure Laterality Date   ??? PR DEBRIDEMENT OPEN WOUND 20 SQ CM< Left     foot       History reviewed. No pertinent family history.    Social History     Socioeconomic History   ??? Marital status: UNKNOWN   Tobacco Use   ??? Smoking status: Former Smoker   ??? Smokeless tobacco: Never Used   Vaping Use   ??? Vaping Use: Never used   Substance and Sexual Activity   ??? Alcohol use: Yes     Comment: occas   ??? Drug use: Never         Current Outpatient Medications:   ???  gabapentin (NEURONTIN) 300 mg capsule, Take 300 mg by mouth three (3) times daily., Disp: , Rfl:   ???  multivitamin (ONE A DAY) tablet, Take 1 Tablet by mouth daily., Disp: , Rfl:   ???  atorvastatin (LIPITOR) 20 mg tablet, Take  by mouth daily., Disp: , Rfl:   ???  levoFLOXacin (LEVAQUIN) 500 mg tablet, Take  by mouth daily., Disp: , Rfl:   ???  clopidogreL (PLAVIX) 75 mg tab, Take  by mouth., Disp: , Rfl:   ???  traZODone (DESYREL) 50 mg tablet, Take  by mouth nightly., Disp: , Rfl:   ???  torsemide (DEMADEX) 100 mg tablet, Take  by mouth daily., Disp: , Rfl:   ???  aspirin 81 mg chewable tablet, Take 81 mg by mouth daily., Disp: , Rfl:   ???  peg 400-propylene glycol (Systane, propylene glycoL,) 0.4-0.3 % drop, as needed., Disp: , Rfl:   ???  amLODIPine (NORVASC) 10 mg tablet, Take  by mouth daily., Disp: , Rfl:      Not on  File    Review of Systems   Constitutional: Positive for chills and malaise/fatigue. Negative for fever and weight loss.   Eyes:        Has a lot of  floaters  In L eye    Respiratory: Negative for cough and shortness of breath.    Cardiovascular: Negative for chest pain.   Gastrointestinal: Positive for constipation.   Genitourinary: Negative.    Neurological: Negative for dizziness and loss of consciousness.   Psychiatric/Behavioral: Positive for depression. The patient is not nervous/anxious.         Physical Exam:  Visit Vitals  BP (!) 182/78 (BP 1 Location: Right arm, BP Patient Position: Sitting)   Pulse 69   Temp 97.3 ??F (36.3 ??C) (Temporal)   Resp 18   Ht 5\' 7"  (1.702 m)   Wt 168 lb (76.2 kg)   SpO2 100%   BMI 26.31 kg/m??       Physical Exam  Constitutional:       Appearance: Normal appearance.   HENT:      Head: Normocephalic.   Cardiovascular:      Rate and Rhythm: Normal rate and regular rhythm.      Pulses: Normal pulses.      Heart sounds: Normal heart sounds. No murmur heard.  No friction rub.   Pulmonary:      Effort: Pulmonary effort is normal.      Breath sounds: Normal breath sounds.   Skin:     General: Skin is warm and dry.   Neurological:      General: No focal deficit present.      Mental Status: He is alert and oriented to person, place, and time.          Labs Reviewed:      Assessment/Plan       ICD-10-CM ICD-9-CM    1. Controlled type 2 diabetes mellitus with diabetic nephropathy, without long-term current use of insulin (HCC)  E11.21 250.40 REFERRAL TO OPHTHALMOLOGY     583.81 AMB POC HEMOGLOBIN A1C      AMB POC GLUCOSE TEST      METABOLIC PANEL, COMPREHENSIVE   2. Primary hypertension  H60 737.1 METABOLIC PANEL, COMPREHENSIVE   3. Diabetic ulcer of other part of foot associated with diabetes mellitus due to underlying condition, with fat layer exposed, unspecified laterality ([place])  E08.621 249.80 CBC WITH AUTOMATED DIFF    G62.694 854.62 METABOLIC PANEL, COMPREHENSIVE      URINALYSIS  W/ RFLX MICROSCOPIC      gabapentin (NEURONTIN) 300 mg capsule   4. Screening for prostate cancer  Z12.5 V76.44 PSA SCREENING (SCREENING)             Briant Cedar, MD

## 2020-07-23 NOTE — Progress Notes (Signed)
Progress  Notes by [place], Margarito Courser L at 07/23/20 1300                Author: Arther Dames  Service: --  Author Type: Technician       Filed: 07/23/20 1522  Encounter Date: 07/23/2020  Status: Signed          Editor: Arther Dames (Technician)               ROOM # [id number]   Identified pt with two pt identifiers(name and DOB). Reviewed record in preparation for visit and have obtained necessary documentation.     Chief Complaint       Patient presents with        ?  [place] in [place], [place] preferred language for health care discussion is Medical laboratory scientific officer.      Is the patient using any DME equipment during OV? YES-WALKING BOOT-LT FOOT      Hunter Higgins is due for:     Health Maintenance Due       Topic        ?  Hepatitis C Screening      ?  Lipid Screen      ?  COVID-19 Vaccine (1)     ?  DTaP/Tdap/Td series (1 - Tdap)     ?  Colorectal Cancer Screening Combo      ?  Shingrix Vaccine Age 75> (1 of 2)     ?  Pneumococcal 65+ years (1 of 1 - PPSV23)     ?  Flu Vaccine (1)        ?  Medicare Yearly Exam         Health Maintenance reviewed and discussed per provider   Please order/place referral if appropriate.   1. For patients aged 32-75: Has the patient had a colonoscopy? NO    If the patient is male:      2. For patients aged 63-74: Has the patient had a mammogram within the past 2 years? N/A      3. For patients aged 21-65: Has the patient had a pap smear? N/A   Advance Directive:   1. Do you have an advance directive in place? Patient Reply: NOT ASKED      2. If not, would you like material regarding how to put one in place?  NOT ASKED      Coordination of Care:   1. Have you been to the ER, urgent care clinic since your last visit?  Hospitalized since your last visit? NO      2. Have you seen or consulted any other health care providers outside of the [facility] since your last visit? Include any pap smears or colon screening.  NO      Patient is accompanied by HIMSELF I have received verbal consent from Hunter Higgins to  discuss any/all medical information while they are present in the room.      Learning Assessment:   No flowsheet data found.   Depression Screening:      3 most recent PHQ Screens  07/23/2020        Little interest or pleasure in doing things  Not at all     Feeling down, depressed, irritable, or hopeless  Not at all        Total  Score PHQ 2  0        Abuse Screening:   No flowsheet data found.   Fall Risk      Fall Risk Assessment, last 12 mths  07/23/2020        Able to walk?  Yes     Fall in past 12 months?  0     Do you feel unsteady?  0        Are you worried about falling  0        Recent Travel Screening and Travel History documentation           Travel Screening             Question     Response            In the last month, have you been in contact with someone who was confirmed or suspected to have Coronavirus / COVID-19?    No / Unsure       Have you had a COVID-19 viral test in the last 14 days?    No       Do you have any of the following new or worsening symptoms?    None of these            Have you traveled internationally or domestically in the last month?    No             Travel History    Travel since 06/23/20      No documented travel since 06/23/20

## 2020-07-24 ENCOUNTER — Encounter

## 2020-07-24 LAB — URINALYSIS W/ RFLX MICROSCOPIC
Bilirubin, Urine: NEGATIVE
Bilirubin: NEGATIVE
Blood, Urine: NEGATIVE
Blood: NEGATIVE
Glucose, Ur: NEGATIVE mg/dL
Glucose: NEGATIVE mg/dL
Ketone: NEGATIVE mg/dL
Ketones, Urine: NEGATIVE mg/dL
Leukocyte Esterase, Urine: NEGATIVE
Leukocyte Esterase: NEGATIVE
Nitrite, Urine: NEGATIVE
Nitrites: NEGATIVE
Protein, UA: 300 mg/dL — AB
Protein: 300 mg/dL — AB
Specific Gravity, UA: 1.015 (ref 1.005–1.030)
Specific gravity: 1.015 (ref 1.005–1.030)
Urobilinogen, UA, POCT: 0.2 EU/dL (ref 0.2–1.0)
Urobilinogen: 0.2 EU/dL (ref 0.2–1.0)
pH (UA): 6 (ref 5.0–8.0)
pH, UA: 6 (ref 5.0–8.0)

## 2020-07-24 LAB — METABOLIC PANEL, COMPREHENSIVE
A-G Ratio: 0.9 (ref 0.8–1.7)
ALT (SGPT): 29 U/L (ref 16–61)
AST (SGOT): 25 U/L (ref 10–38)
Albumin: 3.4 g/dL (ref 3.4–5.0)
Alk. phosphatase: 134 U/L — ABNORMAL HIGH (ref 45–117)
Anion gap: 8 mmol/L (ref 3.0–18)
BUN/Creatinine ratio: 11 — ABNORMAL LOW (ref 12–20)
BUN: 34 MG/DL — ABNORMAL HIGH (ref 7.0–18)
Bilirubin, total: 0.8 MG/DL (ref 0.2–1.0)
CO2: 26 mmol/L (ref 21–32)
Calcium: 9.5 MG/DL (ref 8.5–10.1)
Chloride: 107 mmol/L (ref 100–111)
Creatinine: 3.06 MG/DL — ABNORMAL HIGH (ref 0.6–1.3)
GFR est AA: 25 mL/min/{1.73_m2} — ABNORMAL LOW (ref >60)
GFR est non-AA: 20 mL/min/{1.73_m2} — ABNORMAL LOW (ref >60)
Globulin: 3.9 g/dL (ref 2.0–4.0)
Glucose: 105 mg/dL — ABNORMAL HIGH (ref 74–99)
Potassium: 4.2 mmol/L (ref 3.5–5.5)
Protein, total: 7.3 g/dL (ref 6.4–8.2)
Sodium: 141 mmol/L (ref 136–145)

## 2020-07-24 LAB — CBC WITH AUTOMATED DIFF
ABS. BASOPHILS: 0 10*3/uL (ref 0.0–0.1)
ABS. EOSINOPHILS: 0.2 10*3/uL (ref 0.0–0.4)
ABS. IMM. GRANS.: 0 10*3/uL (ref 0.00–0.04)
ABS. LYMPHOCYTES: 0.8 10*3/uL — ABNORMAL LOW (ref 0.9–3.6)
ABS. MONOCYTES: 0.6 10*3/uL (ref 0.05–1.2)
ABS. NEUTROPHILS: 2.7 10*3/uL (ref 1.8–8.0)
ABSOLUTE NRBC: 0 10*3/uL (ref 0.00–0.01)
BASOPHILS: 1 % (ref 0–2)
EOSINOPHILS: 5 % (ref 0–5)
HCT: 28.6 % — ABNORMAL LOW (ref 36.0–48.0)
HGB: 8.7 g/dL — ABNORMAL LOW (ref 13.0–16.0)
IMMATURE GRANULOCYTES: 0 % (ref 0.0–0.5)
LYMPHOCYTES: 19 % — ABNORMAL LOW (ref 21–52)
MCH: 20.2 PG — ABNORMAL LOW (ref 24.0–34.0)
MCHC: 30.4 g/dL — ABNORMAL LOW (ref 31.0–37.0)
MCV: 66.4 FL — ABNORMAL LOW (ref 78.0–100.0)
MONOCYTES: 15 % — ABNORMAL HIGH (ref 3–10)
MPV: 9.5 FL (ref 9.2–11.8)
NEUTROPHILS: 60 % (ref 40–73)
NRBC: 0 PER 100 WBC
PLATELET COMMENTS: ADEQUATE
PLATELET: 179 10*3/uL (ref 135–420)
RBC: 4.31 M/uL — ABNORMAL LOW (ref 4.35–5.65)
RDW: 16.3 % — ABNORMAL HIGH (ref 11.6–14.5)
WBC: 4.3 10*3/uL — ABNORMAL LOW (ref 4.6–13.2)

## 2020-07-24 LAB — URINE MICROSCOPIC ONLY
BACTERIA, URINE: NEGATIVE /hpf
Bacteria: NEGATIVE /hpf
RBC, UA: 0 /hpf (ref 0–5)
RBC: 0 /hpf (ref 0–5)
WBC, UA: 0 /hpf (ref 0–4)
WBC: 0 /hpf (ref 0–4)

## 2020-07-24 LAB — PSA SCREENING (SCREENING): Prostate Specific Ag: 0.1 ng/mL (ref 0.0–4.0)

## 2020-07-24 LAB — COMPREHENSIVE METABOLIC PANEL
ALT: 29 U/L (ref 16–61)
AST: 25 U/L (ref 10–38)
Albumin/Globulin Ratio: 0.9 (ref 0.8–1.7)
Albumin: 3.4 g/dL (ref 3.4–5.0)
Alkaline Phosphatase: 134 U/L — ABNORMAL HIGH (ref 45–117)
Anion Gap: 8 mmol/L (ref 3.0–18)
BUN: 34 MG/DL — ABNORMAL HIGH (ref 7.0–18)
Bun/Cre Ratio: 11 — ABNORMAL LOW (ref 12–20)
CO2: 26 mmol/L (ref 21–32)
Calcium: 9.5 MG/DL (ref 8.5–10.1)
Chloride: 107 mmol/L (ref 100–111)
Creatinine: 3.06 MG/DL — ABNORMAL HIGH (ref 0.6–1.3)
EGFR IF NonAfrican American: 20 mL/min/{1.73_m2} — ABNORMAL LOW (ref >60)
GFR African American: 25 mL/min/{1.73_m2} — ABNORMAL LOW (ref >60)
Globulin: 3.9 g/dL (ref 2.0–4.0)
Glucose: 105 mg/dL — ABNORMAL HIGH (ref 74–99)
Potassium: 4.2 mmol/L (ref 3.5–5.5)
Sodium: 141 mmol/L (ref 136–145)
Total Bilirubin: 0.8 MG/DL (ref 0.2–1.0)
Total Protein: 7.3 g/dL (ref 6.4–8.2)

## 2020-07-24 LAB — CBC WITH AUTO DIFFERENTIAL
Basophils %: 1 % (ref 0–2)
Basophils Absolute: 0 10*3/uL (ref 0.0–0.1)
Eosinophils %: 5 % (ref 0–5)
Eosinophils Absolute: 0.2 10*3/uL (ref 0.0–0.4)
Granulocyte Absolute Count: 0 10*3/uL (ref 0.00–0.04)
Hematocrit: 28.6 % — ABNORMAL LOW (ref 36.0–48.0)
Hemoglobin: 8.7 g/dL — ABNORMAL LOW (ref 13.0–16.0)
Immature Granulocytes: 0 % (ref 0.0–0.5)
Lymphocytes %: 19 % — ABNORMAL LOW (ref 21–52)
Lymphocytes Absolute: 0.8 10*3/uL — ABNORMAL LOW (ref 0.9–3.6)
MCH: 20.2 PG — ABNORMAL LOW (ref 24.0–34.0)
MCHC: 30.4 g/dL — ABNORMAL LOW (ref 31.0–37.0)
MCV: 66.4 FL — ABNORMAL LOW (ref 78.0–100.0)
MPV: 9.5 FL (ref 9.2–11.8)
Monocytes %: 15 % — ABNORMAL HIGH (ref 3–10)
Monocytes Absolute: 0.6 10*3/uL (ref 0.05–1.2)
NRBC Absolute: 0 10*3/uL (ref 0.00–0.01)
Neutrophils %: 60 % (ref 40–73)
Neutrophils Absolute: 2.7 10*3/uL (ref 1.8–8.0)
Nucleated RBCs: 0 PER 100 WBC
Platelet Comment: ADEQUATE
Platelets: 179 10*3/uL (ref 135–420)
RBC: 4.31 M/uL — ABNORMAL LOW (ref 4.35–5.65)
RDW: 16.3 % — ABNORMAL HIGH (ref 11.6–14.5)
WBC: 4.3 10*3/uL — ABNORMAL LOW (ref 4.6–13.2)

## 2020-07-24 LAB — PSA SCREENING: Pros. Spec. Antigen: 0.1 ng/mL (ref 0.0–4.0)

## 2020-07-24 NOTE — Telephone Encounter (Signed)
Advised has significant kidney disease and we are referring to nephrologist .

## 2020-08-18 IMAGING — MR MR BRAIN/IAC WO/W CM
11 of 12 series · 43 of 48 positions shown · IV contrast (multihance)
Comparison: None.

CLINICAL DATA: Sudden onset hearing loss on left. 4-6 weeks.
History prostate cancer.

EXAM:
MRI HEAD WITHOUT AND WITH CONTRAST
TECHNIQUE: Multiplanar, multiecho pulse sequences of the brain and surrounding
structures were obtained without and with intravenous contrast.
CONTRAST:  15mL MULTIHANCE GADOBENATE DIMEGLUMINE 529 MG/ML IV SOLN

[Series 5: T1 · sagittal · 4.0mm · 0.72mm/px · 3 of 27 slices shown (1 of 3)]
[im 1/27]
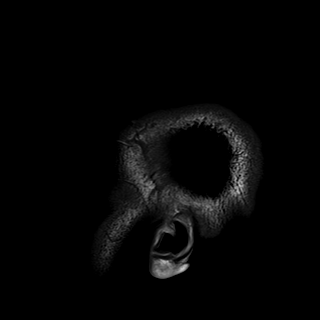
[im 14/27]
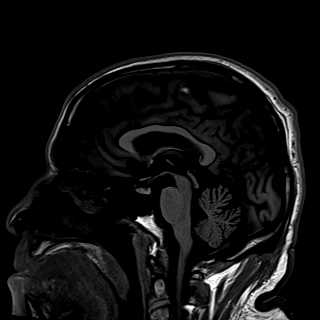
[im 27/27]
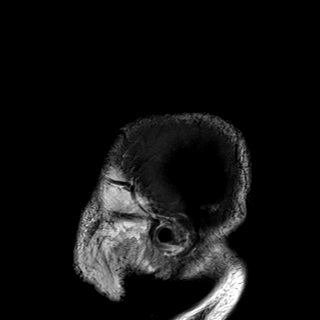

[Series 6: DWI · axial · 3.0mm · 1.44mm/px · z∈[-104,+63]mm · 7 of 88 slices shown (1 of 2)]
[im 1/88]
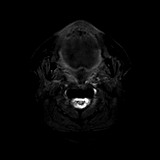
[im 15/88]
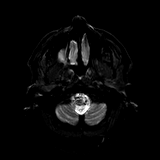
[im 30/88]
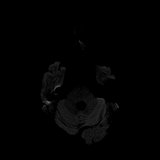
[im 44/88]
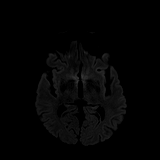
[im 59/88]
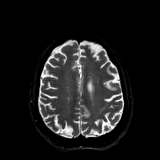
[im 73/88]
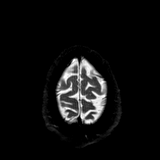
[im 88/88]
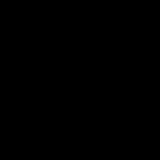

[Series 7: DWI · axial · 3.0mm · 1.44mm/px · z∈[-104,+63]mm · 4 of 44 slices shown (2 of 2)]
[im 1/44]
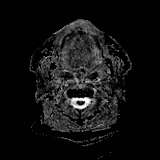
[im 15/44]
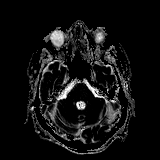
[im 29/44]
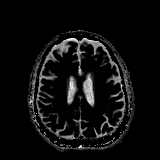
[im 44/44]
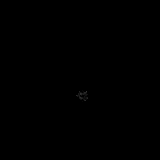

[Series 8: T2 · axial · 4.0mm · 0.36mm/px · z∈[-88,+47]mm · 2 of 27 slices shown]
[im 1/27]
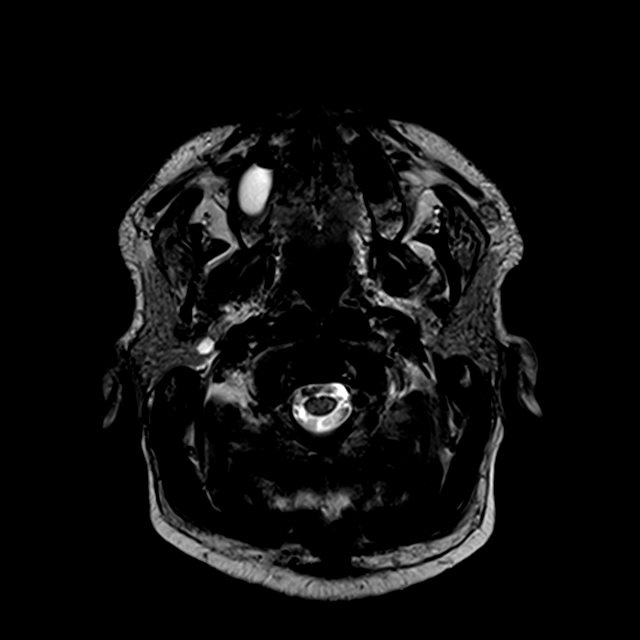
[im 27/27]
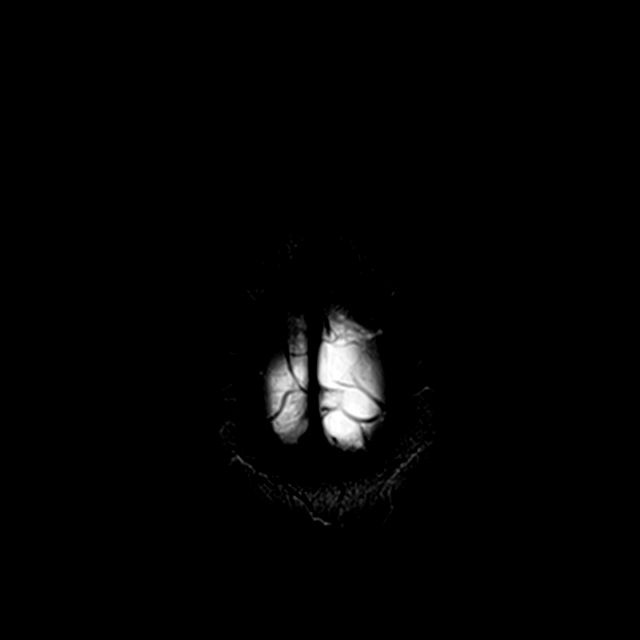

[Series 9: FLAIR · axial · 3.0mm · 0.72mm/px · z∈[-101,+60]mm · 2 of 28 slices shown]
[im 1/28]
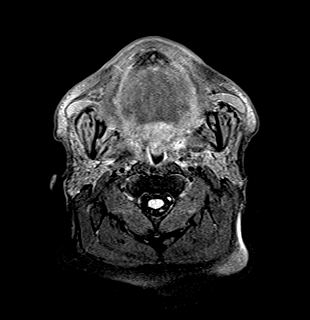
[im 28/28]
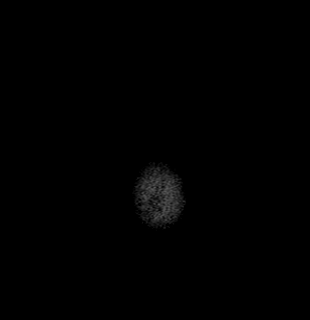

[Series 11: swi_images · axial · 1.5mm · 0.90mm/px · z∈[-94,+48]mm · 8 of 96 slices shown]
[im 1/96]
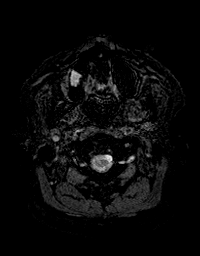
[im 14/96]
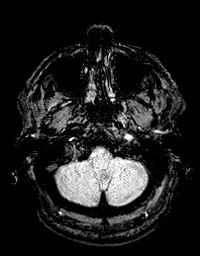
[im 28/96]
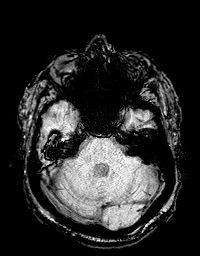
[im 41/96]
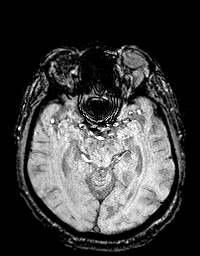
[im 55/96]
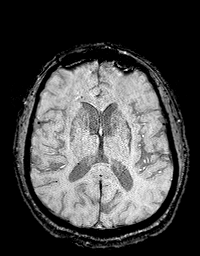
[im 68/96]
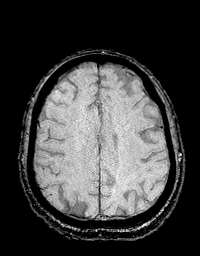
[im 82/96]
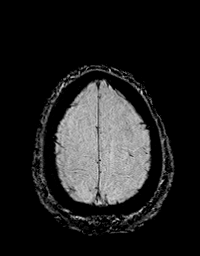
[im 96/96]
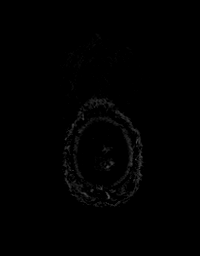

[Series 12: T1 · coronal · 2.5mm · 0.56mm/px · 1 of 15 slices shown (2 of 3)]
[im 1/15]
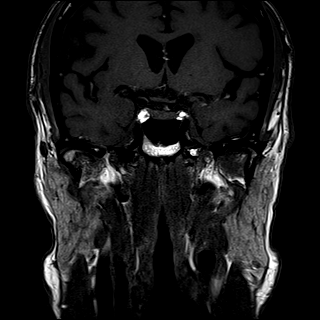

[Series 13: T1 · axial · 2.5mm · 0.50mm/px · 1 of 16 slices shown (3 of 3)]
[im 1/16]
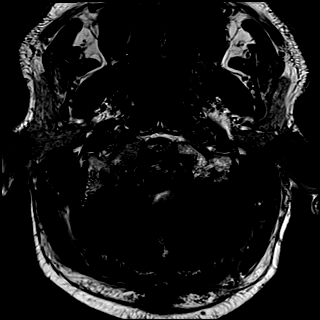

[Series 15: T1 post-contrast · coronal · 2.5mm · 0.56mm/px · 1 of 15 slices shown (1 of 3)]
[im 1/15]
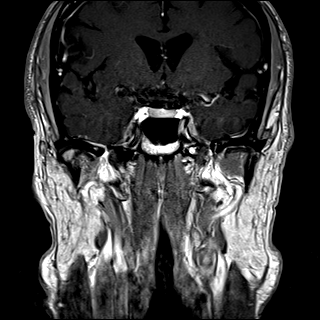

[Series 16: T1 post-contrast · axial · 2.5mm · 0.50mm/px · 1 of 16 slices shown (2 of 3)]
[im 1/16]
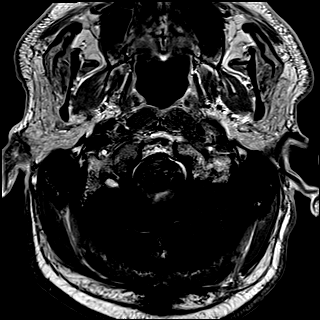

[Series 17: T1 post-contrast · axial · 1.0mm · 0.90mm/px · z∈[-101,+57]mm · 13 of 160 slices shown (3 of 3)]
[im 1/160]
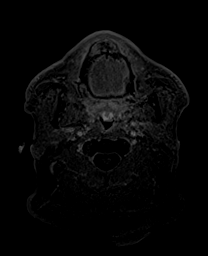
[im 14/160]
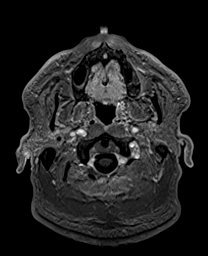
[im 27/160]
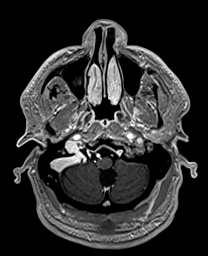
[im 40/160]
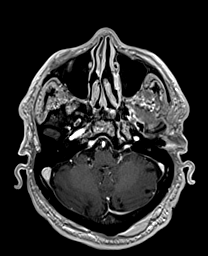
[im 54/160]
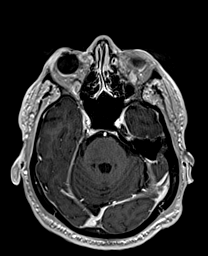
[im 67/160]
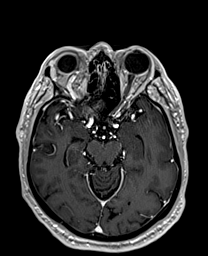
[im 80/160]
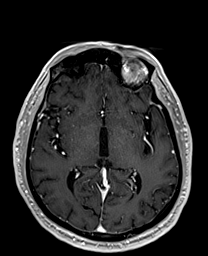
[im 93/160]
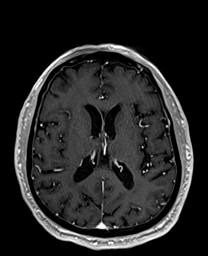
[im 107/160]
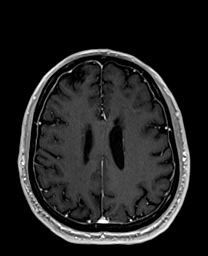
[im 120/160]
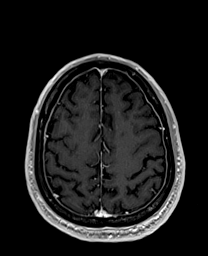
[im 133/160]
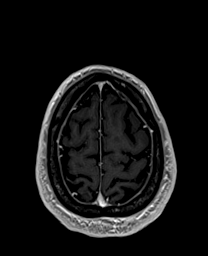
[im 146/160]
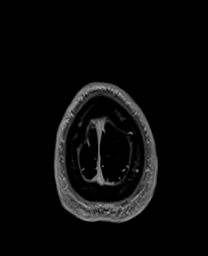
[im 160/160]
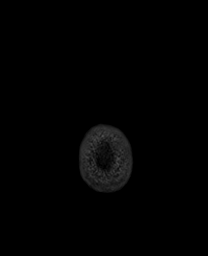

[43 of 48 positions shown; findings below may reference images not displayed]

FINDINGS: Brain: IAC protocol. Seventh and eighth cranial nerves normal.
Negative for vestibular schwannoma. Basilar cisterns normal.
Brainstem and cerebellum normal. Mastoid sinus clear bilaterally.
Normal enhancement of the temporal bone bilaterally.

Mild generalized atrophy. Mild white matter changes most consistent
with chronic microvascular ischemia. Small chronic infarct left
cerebellum. Negative for hemorrhage or mass.

Vascular: Normal arterial flow voids.

Skull and upper cervical spine: No focal skeletal lesion.

Sinuses/Orbits: Retention cyst right maxillary sinus with mild
mucosal edema in the ethmoid sinuses bilaterally. No orbital lesion.
Bilateral cataract extraction.

Other: None
IMPRESSION: No cause for hearing loss identified. Negative for vestibular
schwannoma

Mild atrophy and mild chronic ischemic changes.

## 2020-08-27 ENCOUNTER — Encounter

## 2020-09-10 ENCOUNTER — Encounter

## 2020-09-10 ENCOUNTER — Inpatient Hospital Stay: Admit: 2020-09-10 | Payer: MEDICARE | Attending: Podiatrist | Primary: Geriatric Medicine

## 2020-09-10 DIAGNOSIS — E11621 Type 2 diabetes mellitus with foot ulcer: Secondary | ICD-10-CM

## 2020-09-10 LAB — CREATININE, POC
Creatinine, POC: 3 MG/DL — ABNORMAL HIGH (ref 0.6–1.3)
Creatinine, POC: 3.1 MG/DL — ABNORMAL HIGH (ref 0.6–1.3)
GFRAA, POC: 24 mL/min/{1.73_m2} — ABNORMAL LOW (ref >60)
GFRAA, POC: 25 mL/min/{1.73_m2} — ABNORMAL LOW (ref >60)
GFRNA, POC: 20 mL/min/{1.73_m2} — ABNORMAL LOW (ref >60)
GFRNA, POC: 21 mL/min/{1.73_m2} — ABNORMAL LOW (ref >60)

## 2020-09-10 LAB — AMB POC CREATININE
Creatinine, POC: 3 MG/DL — ABNORMAL HIGH (ref 0.6–1.3)
Creatinine, POC: 3.1 MG/DL — ABNORMAL HIGH (ref 0.6–1.3)
GFR African American: 24 mL/min/{1.73_m2} — ABNORMAL LOW (ref >60)
GFR African American: 25 mL/min/{1.73_m2} — ABNORMAL LOW (ref >60)
GFR Non-African American: 20 mL/min/{1.73_m2} — ABNORMAL LOW (ref >60)
GFR Non-African American: 21 mL/min/{1.73_m2} — ABNORMAL LOW (ref >60)

## 2020-09-15 ENCOUNTER — Ambulatory Visit
Admit: 2020-09-15 | Discharge: 2020-09-15 | Payer: MEDICARE | Attending: Geriatric Medicine | Primary: Geriatric Medicine

## 2020-09-15 ENCOUNTER — Ambulatory Visit: Attending: Geriatric Medicine | Primary: Geriatric Medicine

## 2020-09-15 DIAGNOSIS — I10 Essential (primary) hypertension: Secondary | ICD-10-CM

## 2020-09-15 MED ORDER — HYDRALAZINE 25 MG TAB
25 mg | ORAL_TABLET | Freq: Three times a day (TID) | ORAL | 2 refills | Status: DC
Start: 2020-09-15 — End: 2020-11-03

## 2020-09-15 NOTE — Progress Notes (Signed)
 Hunter Higgins is a 70 y.o. male (DOB: [birth year]) presenting to address:    Chief Complaint   Patient presents with   . Diabetes   . Cholesterol Problem       Vitals:    09/15/20 1438   BP: (!) 206/85   Pulse: 69   Resp: 18   Temp: 97.1 F (36.2 C)   TempSrc: Temporal   SpO2: 98%   Weight: 165 lb (74.8 kg)   Height: 5' 7 (1.702 m)   PainSc:   7   PainLoc: Chest       Hearing/Vision:   No exam data present    Learning Assessment:   No flowsheet data found.  Depression Screening:     3 most recent PHQ Screens 09/15/2020   Little interest or pleasure in doing things Several days   Feeling down, depressed, irritable, or hopeless Several days   Total Score PHQ 2 2     Fall Risk Assessment:     Fall Risk Assessment, last 12 mths 09/15/2020   Able to walk? Yes   Fall in past 12 months? 0   Do you feel unsteady? -   Are you worried about falling -     Abuse Screening:   No flowsheet data found.  Coordination of Care Questionaire:     Advanced Directive:   1. Do you have an Advanced Directive? NO    2. Would you like information on Advanced Directives? NO    1. Have you been to the ER, urgent care clinic since your last visit?  Hospitalized since your last visit? Yes Where: [facility]    2. Have you seen or consulted any other health care providers outside of the [facility] since your last visit? No     3. For patients aged 7-75: Has the patient had a colonoscopy? No     If the patient is male:    4. For patients aged 70-74: Has the patient had a mammogram within the past 2 years? NA - based on age    24. For patients aged 21-65: Has the patient had a pap smear? NA - based on age

## 2020-09-15 NOTE — Progress Notes (Signed)
Progress Note    Patient: Hunter Higgins               Sex: male                  Date of Birth:  06/01/1951      Age:  70 y.o.                    HPI:     Hunter Higgins is a 70 y.o. male who has been seen for followup of hypertension , DM  , diabetic foot ulcer. He has seen podiatry and  Home care frequency has been  Increased to 3x a day . He had an MRI last week at [facility] . He had some breathing issues and was told he had some CHF     Past Medical History:   Diagnosis Date   ??? CAD (coronary artery disease)    ??? Diabetes ([place])    ??? Diabetic foot ulcer ([place])     Left   ??? Hypertension    ??? Retinopathy    ??? Vascular complication    ??? Visual impairment        Past Surgical History:   Procedure Laterality Date   ??? PR DEBRIDEMENT OPEN WOUND 20 SQ CM< Left     foot       No family history on file.    Social History     Socioeconomic History   ??? Marital status: UNKNOWN   Tobacco Use   ??? Smoking status: Former Smoker   ??? Smokeless tobacco: Never Used   Vaping Use   ??? Vaping Use: Never used   Substance and Sexual Activity   ??? Alcohol use: Yes     Comment: occas   ??? Drug use: Never         Current Outpatient Medications:   ???  multivitamin (ONE A DAY) tablet, Take 1 Tablet by mouth daily., Disp: , Rfl:   ???  atorvastatin (LIPITOR) 20 mg tablet, Take  by mouth daily., Disp: , Rfl:   ???  clopidogreL (PLAVIX) 75 mg tab, Take  by mouth., Disp: , Rfl:   ???  aspirin 81 mg chewable tablet, Take 81 mg by mouth daily., Disp: , Rfl:   ???  amLODIPine (NORVASC) 10 mg tablet, Take  by mouth daily., Disp: , Rfl:   ???  furosemide (LASIX) 20 mg tablet, Take  by mouth daily., Disp: , Rfl:   ???  levoFLOXacin (LEVAQUIN) 500 mg tablet, Take  by mouth daily., Disp: , Rfl:   ???  traZODone (DESYREL) 50 mg tablet, Take  by mouth nightly. (Patient not taking: Reported on 09/15/2020), Disp: , Rfl:   ???  torsemide (DEMADEX) 100 mg tablet, Take  by mouth daily., Disp: , Rfl:   ???  peg 400-propylene glycol (Systane, propylene glycoL,) 0.4-0.3 % drop, as needed.,  Disp: , Rfl:   ???  gabapentin (NEURONTIN) 300 mg capsule, Take 1 Capsule by mouth three (3) times daily. Max Daily Amount: 900 mg. Indications: neuropathic pain (Patient not taking: Reported on 09/15/2020), Disp: 100 Capsule, Rfl: 0     Allergies   Allergen Reactions   ??? Naproxen Swelling       ROS     Physical Exam:      Visit Vitals  BP (!) 206/85 (BP 1 Location: Right upper arm, BP Patient Position: Sitting, BP Cuff Size: Adult)   Pulse 69   Temp 97.1 ??F (36.2 ??C) (  Temporal)   Resp 18   Ht '5\' 7"'$  (1.702 m)   Wt 165 lb (74.8 kg)   SpO2 98%   BMI 25.84 kg/m??       Physical Exam  Constitutional:       Appearance: Normal appearance.   Cardiovascular:      Rate and Rhythm: Normal rate and regular rhythm.      Heart sounds: No murmur heard.  No friction rub. No gallop.    Pulmonary:      Effort: Pulmonary effort is normal.      Breath sounds: Normal breath sounds.   Musculoskeletal:      Right lower leg: No edema.      Left lower leg: No edema.   Skin:     General: Skin is warm.      Coloration: Skin is not jaundiced.   Neurological:      Mental Status: He is alert and oriented to person, place, and time.   Psychiatric:         Mood and Affect: Mood normal.         Behavior: Behavior normal.         Thought Content: Thought content normal.          Labs Reviewed:      Assessment/Plan       ICD-10-CM ICD-9-CM    1. Primary hypertension  I10 401.9 REFERRAL TO NEPHROLOGY   2. CKD (chronic kidney disease) stage 4, GFR 15-29 ml/min (HCC)  N18.4 585.4 REFERRAL TO NEPHROLOGY   3. Uncontrolled type 2 diabetes mellitus with hyperglycemia (HCC)  E11.65 250.02 REFERRAL TO NEPHROLOGY   4. Hearing loss of left ear, unspecified hearing loss type  H91.92 389.9 REFERRAL TO ENT-OTOLARYNGOLOGY             Briant Cedar, MD

## 2020-09-28 ENCOUNTER — Other Ambulatory Visit: Payer: Self-pay | Admitting: Internal Medicine

## 2020-10-13 ENCOUNTER — Inpatient Hospital Stay: Admit: 2020-10-13 | Payer: MEDICARE | Primary: Geriatric Medicine

## 2020-10-13 ENCOUNTER — Ambulatory Visit
Admit: 2020-10-13 | Discharge: 2020-10-13 | Payer: MEDICARE | Attending: Geriatric Medicine | Primary: Geriatric Medicine

## 2020-10-13 ENCOUNTER — Ambulatory Visit: Attending: Geriatric Medicine | Primary: Geriatric Medicine

## 2020-10-13 DIAGNOSIS — I10 Essential (primary) hypertension: Secondary | ICD-10-CM

## 2020-10-13 DIAGNOSIS — E0800 Diabetes mellitus due to underlying condition with hyperosmolarity without nonketotic hyperglycemic-hyperosmolar coma (NKHHC): Secondary | ICD-10-CM

## 2020-10-13 LAB — AMB POC HEMOGLOBIN A1C
Hemoglobin A1C, POC: 6.9 %
Hemoglobin A1c (POC): 6.9 %

## 2020-10-13 MED ORDER — GABAPENTIN 300 MG CAP
300 mg | ORAL_CAPSULE | Freq: Three times a day (TID) | ORAL | 0 refills | Status: DC
Start: 2020-10-13 — End: 2021-01-21

## 2020-10-13 NOTE — Progress Notes (Signed)
Spoke with patient and gave resulted note. Patient stated he thought he had appointment but he is not sure.    I gave patient number and told him to give them a call to schedule.

## 2020-10-13 NOTE — Progress Notes (Signed)
Progress Note    Patient: Hunter Higgins               Sex: male                  Date of Birth:  1951/07/27      Age:  70 y.o.                    HPI:     Hunter Higgins is a 69 y.o. male who has been seen for  hypertension , DM and CKD. He has a nephrology appt next week . He wants his gabapentin refilled     Past Medical History:   Diagnosis Date   ??? CAD (coronary artery disease)    ??? Diabetes ([place])    ??? Diabetic foot ulcer ([place])     Left   ??? Hypertension    ??? Retinopathy    ??? Vascular complication    ??? Visual impairment        Past Surgical History:   Procedure Laterality Date   ??? PR DEBRIDEMENT OPEN WOUND 20 SQ CM< Left     foot       No family history on file.    Social History     Socioeconomic History   ??? Marital status: UNKNOWN   Tobacco Use   ??? Smoking status: Former Smoker   ??? Smokeless tobacco: Never Used   Vaping Use   ??? Vaping Use: Never used   Substance and Sexual Activity   ??? Alcohol use: Yes     Comment: occas   ??? Drug use: Never         Current Outpatient Medications:   ???  hydrALAZINE (APRESOLINE) 25 mg tablet, Take 1 Tablet by mouth three (3) times daily., Disp: 120 Tablet, Rfl: 2  ???  multivitamin (ONE A DAY) tablet, Take 1 Tablet by mouth daily., Disp: , Rfl:   ???  atorvastatin (LIPITOR) 20 mg tablet, Take  by mouth daily., Disp: , Rfl:   ???  levoFLOXacin (LEVAQUIN) 500 mg tablet, Take  by mouth daily., Disp: , Rfl:   ???  clopidogreL (PLAVIX) 75 mg tab, Take  by mouth., Disp: , Rfl:   ???  traZODone (DESYREL) 50 mg tablet, Take  by mouth nightly. (Patient not taking: Reported on 09/15/2020), Disp: , Rfl:   ???  torsemide (DEMADEX) 100 mg tablet, Take  by mouth daily., Disp: , Rfl:   ???  aspirin 81 mg chewable tablet, Take 81 mg by mouth daily., Disp: , Rfl:   ???  peg 400-propylene glycol (Systane, propylene glycoL,) 0.4-0.3 % drop, as needed., Disp: , Rfl:   ???  amLODIPine (NORVASC) 10 mg tablet, Take  by mouth daily., Disp: , Rfl:   ???  gabapentin (NEURONTIN) 300 mg capsule, Take 1 Capsule by mouth three (3)  times daily. Max Daily Amount: 900 mg. Indications: neuropathic pain (Patient not taking: Reported on 09/15/2020), Disp: 100 Capsule, Rfl: 0  ???  furosemide (LASIX) 20 mg tablet, Take  by mouth daily., Disp: , Rfl:      Allergies   Allergen Reactions   ??? Naproxen Swelling       Review of Systems   Constitutional: Negative for chills and fever.        Feels cold all the time    Respiratory: Negative for cough.    Cardiovascular: Negative for chest pain.   Gastrointestinal: Negative.    Genitourinary: Negative.    Neurological:  Negative for dizziness.   Psychiatric/Behavioral: The patient is nervous/anxious.         Physical Exam:      There were no vitals taken for this visit.    Physical Exam  Constitutional:       Appearance: Normal appearance.   Cardiovascular:      Rate and Rhythm: Normal rate and regular rhythm.      Heart sounds: No murmur heard.  No gallop.    Pulmonary:      Effort: Pulmonary effort is normal. No respiratory distress.      Breath sounds: Normal breath sounds. No stridor. No wheezing or rhonchi.   Skin:     General: Skin is warm and dry.   Neurological:      Mental Status: He is alert.          Labs Reviewed:      Assessment/Plan       ICD-10-CM ICD-9-CM    1. Diabetes mellitus due to underlying condition with hyperosmolarity without coma, unspecified whether long term insulin use (HCC)  E08.00 249.20 AMB POC HEMOGLOBIN A1C   2. Ulcer of foot, limited to breakdown of skin, unspecified laterality (HCC)  L97.501 707.15 AMB POC HEMOGLOBIN A1C      Advised to be sure to keep his nephrology appt.       Briant Cedar, MD           Depression Risk Factor Screening                   Cognitive Screening            Health Maintenance Due         Patient Care Team       History                Huston Foley, MD

## 2020-10-13 NOTE — Progress Notes (Signed)
Attempted to contact pt at hm number, no answer. Lvm for pt to return call to office at [phone] . Will continue to try to contact pt.

## 2020-10-14 LAB — METABOLIC PANEL, BASIC
Anion gap: 8 mmol/L (ref 3.0–18)
BUN/Creatinine ratio: 15 (ref 12–20)
BUN: 54 MG/DL — ABNORMAL HIGH (ref 7.0–18)
CO2: 26 mmol/L (ref 21–32)
Calcium: 10.8 MG/DL — ABNORMAL HIGH (ref 8.5–10.1)
Chloride: 105 mmol/L (ref 100–111)
Creatinine: 3.68 MG/DL — ABNORMAL HIGH (ref 0.6–1.3)
GFR est AA: 20 mL/min/{1.73_m2} — ABNORMAL LOW (ref >60)
GFR est non-AA: 16 mL/min/{1.73_m2} — ABNORMAL LOW (ref >60)
Glucose: 153 mg/dL — ABNORMAL HIGH (ref 74–99)
Potassium: 4.9 mmol/L (ref 3.5–5.5)
Sodium: 139 mmol/L (ref 136–145)

## 2020-10-14 LAB — CBC WITH AUTOMATED DIFF
ABS. BASOPHILS: 0 10*3/uL (ref 0.0–0.1)
ABS. EOSINOPHILS: 0.3 10*3/uL (ref 0.0–0.4)
ABS. IMM. GRANS.: 0 10*3/uL (ref 0.00–0.04)
ABS. LYMPHOCYTES: 0.7 10*3/uL — ABNORMAL LOW (ref 0.9–3.6)
ABS. MONOCYTES: 0.8 10*3/uL (ref 0.05–1.2)
ABS. NEUTROPHILS: 2.6 10*3/uL (ref 1.8–8.0)
ABSOLUTE NRBC: 0 10*3/uL (ref 0.00–0.01)
BASOPHILS: 0 % (ref 0–2)
EOSINOPHILS: 6 % — ABNORMAL HIGH (ref 0–5)
HCT: 28.2 % — ABNORMAL LOW (ref 36.0–48.0)
HGB: 8.5 g/dL — ABNORMAL LOW (ref 13.0–16.0)
IMMATURE GRANULOCYTES: 0 % (ref 0.0–0.5)
LYMPHOCYTES: 16 % — ABNORMAL LOW (ref 21–52)
MCH: 19.1 PG — ABNORMAL LOW (ref 24.0–34.0)
MCHC: 30.1 g/dL — ABNORMAL LOW (ref 31.0–37.0)
MCV: 63.4 FL — ABNORMAL LOW (ref 78.0–100.0)
MONOCYTES: 18 % — ABNORMAL HIGH (ref 3–10)
NEUTROPHILS: 60 % (ref 40–73)
NRBC: 0 PER 100 WBC
PLATELET COMMENTS: ADEQUATE
PLATELET: 202 10*3/uL (ref 135–420)
RBC: 4.45 M/uL (ref 4.35–5.65)
RDW: 18.6 % — ABNORMAL HIGH (ref 11.6–14.5)
WBC: 4.4 10*3/uL — ABNORMAL LOW (ref 4.6–13.2)

## 2020-10-14 LAB — CBC WITH AUTO DIFFERENTIAL
Basophils %: 0 % (ref 0–2)
Basophils Absolute: 0 10*3/uL (ref 0.0–0.1)
Eosinophils %: 6 % — ABNORMAL HIGH (ref 0–5)
Eosinophils Absolute: 0.3 10*3/uL (ref 0.0–0.4)
Granulocyte Absolute Count: 0 10*3/uL (ref 0.00–0.04)
Hematocrit: 28.2 % — ABNORMAL LOW (ref 36.0–48.0)
Hemoglobin: 8.5 g/dL — ABNORMAL LOW (ref 13.0–16.0)
Immature Granulocytes: 0 % (ref 0.0–0.5)
Lymphocytes %: 16 % — ABNORMAL LOW (ref 21–52)
Lymphocytes Absolute: 0.7 10*3/uL — ABNORMAL LOW (ref 0.9–3.6)
MCH: 19.1 PG — ABNORMAL LOW (ref 24.0–34.0)
MCHC: 30.1 g/dL — ABNORMAL LOW (ref 31.0–37.0)
MCV: 63.4 FL — ABNORMAL LOW (ref 78.0–100.0)
Monocytes %: 18 % — ABNORMAL HIGH (ref 3–10)
Monocytes Absolute: 0.8 10*3/uL (ref 0.05–1.2)
NRBC Absolute: 0 10*3/uL (ref 0.00–0.01)
Neutrophils %: 60 % (ref 40–73)
Neutrophils Absolute: 2.6 10*3/uL (ref 1.8–8.0)
Nucleated RBCs: 0 PER 100 WBC
Platelet Comment: ADEQUATE
Platelets: 202 10*3/uL (ref 135–420)
RBC: 4.45 M/uL (ref 4.35–5.65)
RDW: 18.6 % — ABNORMAL HIGH (ref 11.6–14.5)
WBC: 4.4 10*3/uL — ABNORMAL LOW (ref 4.6–13.2)

## 2020-10-14 LAB — BASIC METABOLIC PANEL
Anion Gap: 8 mmol/L (ref 3.0–18)
BUN: 54 MG/DL — ABNORMAL HIGH (ref 7.0–18)
Bun/Cre Ratio: 15 (ref 12–20)
CO2: 26 mmol/L (ref 21–32)
Calcium: 10.8 MG/DL — ABNORMAL HIGH (ref 8.5–10.1)
Chloride: 105 mmol/L (ref 100–111)
Creatinine: 3.68 MG/DL — ABNORMAL HIGH (ref 0.6–1.3)
EGFR IF NonAfrican American: 16 mL/min/{1.73_m2} — ABNORMAL LOW (ref >60)
GFR African American: 20 mL/min/{1.73_m2} — ABNORMAL LOW (ref >60)
Glucose: 153 mg/dL — ABNORMAL HIGH (ref 74–99)
Potassium: 4.9 mmol/L (ref 3.5–5.5)
Sodium: 139 mmol/L (ref 136–145)

## 2020-10-27 ENCOUNTER — Encounter: Payer: MEDICARE | Attending: Geriatric Medicine | Primary: Geriatric Medicine

## 2020-10-29 NOTE — Telephone Encounter (Signed)
Advised should go to ER now   possible renal failure

## 2020-10-29 NOTE — Telephone Encounter (Signed)
S/w the pt and Relayed the PCP's notes, He should get repeat creatinine Stat . Go to ER per Dr. Valere Dross. Pt verbalizes understanding and states he will go to [facility] today.    Notified [facility] ER via telephone and faxed most recent labs and demographics to [phone].

## 2020-10-29 NOTE — Telephone Encounter (Signed)
 Received call from [place] at [facility] with [employer] Complaint.    Subjective: Caller states I haven't been eating like I should for the last few days. I've been sleeping and not wanting to get out of bed.     Current Symptoms: No appetite, fatigue, weakness     Onset: 10/24/20    Associated Symptoms: reduced appetite    Pain Severity: Denies pain     Temperature: Denies fever and feeling feverish/chills     What has been tried: Nothing     LMP: NA Pregnant: NA    Recommended disposition: See HCP within 4 Hours (or PCP triage)- Advised caller if unable to get an appointment in the suggested time frame to go to an [facility] or walk-in clinic, caller agreeable.    Care advice provided, patient verbalizes understanding; denies any other questions or concerns; instructed to call back for any new or worsening symptoms.    Patient/Caller agrees with recommended disposition; Clinical research associate provided warm transfer to D[place]  at [facility] for appointment scheduling.    Attention Provider:  Thank you for allowing me to participate in the care of your patient.  The patient was connected to triage in response to information provided to the ECC.  Please do not respond through this encounter as the response is not directed to a shared pool.    Reason for Disposition  . [1] MODERATE weakness (i.e., interferes with work, school, normal activities) AND [2] cause unknown  (Exceptions: weakness with acute minor illness, or weakness from poor fluid intake)    Protocols used: WEAKNESS (GENERALIZED) AND FATIGUE-ADULT-AH

## 2020-10-30 NOTE — Telephone Encounter (Signed)
Chart reviewed. Pt not seen at a local ER. Notified PCP.

## 2020-11-03 ENCOUNTER — Ambulatory Visit
Admit: 2020-11-03 | Discharge: 2020-11-03 | Payer: MEDICARE | Attending: Geriatric Medicine | Primary: Geriatric Medicine

## 2020-11-03 ENCOUNTER — Inpatient Hospital Stay: Admit: 2020-11-03 | Payer: MEDICARE | Primary: Geriatric Medicine

## 2020-11-03 ENCOUNTER — Ambulatory Visit: Attending: Geriatric Medicine | Primary: Geriatric Medicine

## 2020-11-03 DIAGNOSIS — E0800 Diabetes mellitus due to underlying condition with hyperosmolarity without nonketotic hyperglycemic-hyperosmolar coma (NKHHC): Secondary | ICD-10-CM

## 2020-11-03 DIAGNOSIS — N184 Chronic kidney disease, stage 4 (severe): Secondary | ICD-10-CM

## 2020-11-03 LAB — AMB POC GLUCOSE TEST: Glucose dose-GTT: 177

## 2020-11-03 MED ORDER — HYDRALAZINE 50 MG TAB
50 mg | ORAL_TABLET | Freq: Three times a day (TID) | ORAL | 5 refills | Status: DC
Start: 2020-11-03 — End: 2021-05-11

## 2020-11-03 MED ORDER — HYDRALAZINE 25 MG TAB
25 mg | ORAL_TABLET | Freq: Three times a day (TID) | ORAL | 2 refills | Status: DC
Start: 2020-11-03 — End: 2020-11-03

## 2020-11-03 NOTE — Progress Notes (Signed)
Spoke with pt in regards to lab results. Relayed the PCP's note. Pt states he is scheduled to see at 2:00pm with [facility] today. Pt voices no concerns at this time. Lab results faxed.

## 2020-11-03 NOTE — Progress Notes (Signed)
Progress Note    Patient: Hunter Higgins               Sex: male                  Date of Birth:  [birth year]      Age:  70 y.o.                    HPI:     Hunter Higgins is a 70 y.o. male who has been seen for followup of his hypertension , diabetes and CKD .  He is due to make a nephrology appt . [name] He did not  remember calling us last week re fatigue and poor appetite  Wife phone # [phone]. I spoke to  her about his situation . I encouraged her to help make the appt  with the nephrologist     Past Medical History:   Diagnosis Date   ??? CAD (coronary artery disease)    ??? Diabetes ([place])    ??? Diabetic foot ulcer ([place])     Left   ??? Hypertension    ??? Retinopathy    ??? Vascular complication    ??? Visual impairment        Past Surgical History:   Procedure Laterality Date   ??? PR DEBRIDEMENT OPEN WOUND 20 SQ CM< Left     foot       No family history on file.    Social History     Socioeconomic History   ??? Marital status: UNKNOWN   Tobacco Use   ??? Smoking status: Former Smoker     Packs/day: 0.00     Years: 1.00     Pack years: 0.00     Start date: 11/04/1967     Quit date: 11/03/1968     Years since quitting: 52.0   ??? Smokeless tobacco: Never Used   Vaping Use   ??? Vaping Use: Never used   Substance and Sexual Activity   ??? Alcohol use: Yes     Comment: occas   ??? Drug use: Never         Current Outpatient Medications:   ???  hydrALAZINE (APRESOLINE) 25 mg tablet, Take 1 Tablet by mouth three (3) times daily., Disp: 120 Tablet, Rfl: 2  ???  multivitamin (ONE A DAY) tablet, Take 1 Tablet by mouth daily., Disp: , Rfl:   ???  atorvastatin (LIPITOR) 20 mg tablet, Take  by mouth daily., Disp: , Rfl:   ???  clopidogreL (PLAVIX) 75 mg tab, Take  by mouth., Disp: , Rfl:   ???  torsemide (DEMADEX) 100 mg tablet, Take  by mouth daily., Disp: , Rfl:   ???  aspirin 81 mg chewable tablet, Take 81 mg by mouth daily., Disp: , Rfl:   ???  amLODIPine (NORVASC) 10 mg tablet, Take  by mouth daily., Disp: , Rfl:   ???  furosemide (LASIX) 20 mg tablet, Take  by  mouth daily., Disp: , Rfl:   ???  gabapentin (NEURONTIN) 300 mg capsule, Take 1 Capsule by mouth three (3) times daily. Max Daily Amount: 900 mg. Indications: neuropathic pain (Patient not taking: Reported on 11/03/2020), Disp: 100 Capsule, Rfl: 0  ???  levoFLOXacin (LEVAQUIN) 500 mg tablet, Take  by mouth daily. (Patient not taking: Reported on 10/13/2020), Disp: , Rfl:   ???  traZODone (DESYREL) 50 mg tablet, Take  by mouth nightly. (Patient not taking: Reported on 09/15/2020), Disp: , Rfl:   ???  peg 400-propylene glycol (Systane, propylene glycoL,) 0.4-0.3 % drop, as needed. (Patient not taking: Reported on 11/03/2020), Disp: , Rfl:      Allergies   Allergen Reactions   ??? Naproxen Swelling       Review of Systems   Constitutional: Positive for chills. Negative for fever and weight loss.   HENT: Positive for hearing loss.         He is due to see Hearing specialist in [place]    Respiratory: Negative for cough.    Cardiovascular: Negative for chest pain.   Gastrointestinal: Negative.    Genitourinary: Negative.    Neurological: Negative for dizziness and loss of consciousness.        Physical Exam:      There were no vitals taken for this visit.    Physical Exam  Constitutional:       Appearance: Normal appearance.   Cardiovascular:      Rate and Rhythm: Normal rate and regular rhythm.      Pulses: Normal pulses.      Heart sounds: Normal heart sounds.   Skin:     General: Skin is warm and dry.   Neurological:      Mental Status: He is alert.          Labs Reviewed:  creatinine 3.0     Assessment/Plan       ICD-10-CM ICD-9-CM    1. Diabetes mellitus due to underlying condition with hyperosmolarity without coma, unspecified whether long term insulin use (HCC)  E08.00 249.20 FRUCTOSAMINE      AMB POC GLUCOSE TEST   2. Hypertension, unspecified type  I10 401.9 hydrALAZINE (APRESOLINE) 50 mg tablet   3. CKD (chronic kidney disease) stage 4, GFR 15-29 ml/min (HCC)  N18.4 585.4 PTH INTACT      hydrALAZINE (APRESOLINE) 50 mg  tablet   4. Anemia due to stage 4 chronic kidney disease (HCC)  N18.4 285.21 IRON PROFILE    D63.1 585.4 VITAMIN B12 & FOLATE   will increase apresoline to 50 mg tid   POC glucose 177       Briant Cedar, MD

## 2020-11-03 NOTE — Progress Notes (Signed)
1. "Have you been to the ER, urgent care clinic since your last visit?  Hospitalized since your last visit?" no    2. "Have you seen or consulted any other health care providers outside of the [facility] since your last visit?" No     3. For patients aged 72-75: Has the patient had a colonoscopy / FIT/ Cologuard? No     If the patient is male:    4. For patients aged 74-74: Has the patient had a mammogram within the past 2 years? NA based on age or sex    16. For patients aged 21-65: Has the patient had a pap smear? NA based on age or sex

## 2020-11-04 LAB — IRON PROFILE
Iron % saturation: 22 % (ref 20–50)
Iron: 64 ug/dL (ref 50–175)
TIBC: 286 ug/dL (ref 250–450)

## 2020-11-04 LAB — PTH INTACT
Calcium: 12.2 MG/DL — ABNORMAL HIGH (ref 8.5–10.1)
PTH, Intact: 8.6 pg/mL — ABNORMAL LOW (ref 18.4–88.0)

## 2020-11-04 LAB — VITAMIN B12 & FOLATE
Folate: >20 ng/mL — ABNORMAL HIGH (ref 3.10–17.50)
Folate: >20 ng/mL — ABNORMAL HIGH (ref 3.10–17.50)
Vitamin B-12: 1200 pg/mL — ABNORMAL HIGH (ref 211–911)
Vitamin B12: 1200 pg/mL — ABNORMAL HIGH (ref 211–911)

## 2020-11-04 LAB — PTH, INTACT
Calcium: 12.2 MG/DL — ABNORMAL HIGH (ref 8.5–10.1)
PTH: 8.6 pg/mL — ABNORMAL LOW (ref 18.4–88.0)

## 2020-11-04 LAB — IRON AND TIBC
Iron Saturation: 22 % (ref 20–50)
Iron: 64 ug/dL (ref 50–175)
TIBC: 286 ug/dL (ref 250–450)

## 2020-11-05 LAB — FRUCTOSAMINE
Fructosamine: 314 umol/L — ABNORMAL HIGH (ref 0–285)
Fructosamine: 314 umol/L — ABNORMAL HIGH (ref 0–285)

## 2020-11-05 NOTE — Telephone Encounter (Signed)
Spoke to patient and his wife re high calcium . Advised needs urgent  treatment . Sent to [place]

## 2020-11-06 NOTE — Telephone Encounter (Signed)
-----   Message from Mickey Farber sent at 11/06/2020 11:34 AM EDT -----  Subject: Message to Provider    QUESTIONS  Information for Provider? PATIENT WANTED TO LET THE OFFICE KNOW HE WAS AT   THE EMERGENCY ROOM LAST NIGHT, WE SCHEDULED HIM A FOLLOW UP APPOINTMENT.   ---------------------------------------------------------------------------  --------------  CALL BACK INFO  What is the best way for the office to contact you? OK to leave message on   voicemail  Preferred Call Back Phone Number? [phone]  ---------------------------------------------------------------------------  --------------  SCRIPT ANSWERS  Relationship to Patient? Self

## 2020-11-11 NOTE — Telephone Encounter (Signed)
Confirmed upcoming appointment with Hunter Higgins for 11/12/2020. Patient also confirmed that he will continue with [facility].

## 2020-11-12 ENCOUNTER — Encounter: Payer: MEDICARE | Attending: Geriatric Medicine | Primary: Geriatric Medicine

## 2020-11-17 ENCOUNTER — Encounter: Payer: MEDICARE | Attending: Geriatric Medicine | Primary: Geriatric Medicine

## 2020-11-19 NOTE — Telephone Encounter (Signed)
-----   Message from [name] sent at 11/16/2020 12:54 PM EDT -----  Subject: Message to Provider    QUESTIONS  Information for Provider? Insurance company would like an updated dosage   and meds list. Patient reached out and meds were confusing to patient. Hunter Higgins   [id number]  ---------------------------------------------------------------------------  --------------  Rod Can INFO  What is the best way for the office to contact you? OK to leave message on   voicemail  Preferred Call Back Phone Number? [phone]  ---------------------------------------------------------------------------  --------------  SCRIPT ANSWERS  Relationship to Patient? Third Party  Third Party Type? Insurance?   Representative Name? [employer]

## 2020-12-12 LAB — HEMOGLOBIN A1C
Estimated Avg Glucose, External: 170 mg/dL — ABNORMAL HIGH (ref 91–123)
Hemoglobin A1C, External: 7.6 % — ABNORMAL HIGH (ref 4.8–5.6)

## 2020-12-24 LAB — EJECTION FRACTION PERCENTAGE, EXTERNAL: Left Ventricular Ejection Fraction, External: 51

## 2020-12-24 LAB — HEMOGLOBIN A1C
Estimated Avg Glucose, External: 173 mg/dL — ABNORMAL HIGH (ref 91–123)
Hemoglobin A1C, External: 7.7 % — ABNORMAL HIGH (ref 4.8–5.6)

## 2021-01-08 NOTE — Telephone Encounter (Signed)
Patient's wife is calling in  To request orders for home health.  States the patient was recently relaesed from the hospital and spent one day in rehab before he came home as the wife did not feel he was safe at that facility.  States the patient is very weak and had to crawl in the house when he got home as he was too weak to walk.  Informed her the patient will probably need an appt prior to orders being done and she said to just let he know so she can figure out how to get him here.

## 2021-01-09 ENCOUNTER — Encounter

## 2021-01-21 ENCOUNTER — Inpatient Hospital Stay: Admit: 2021-01-21 | Discharge: 2021-01-21 | Payer: MEDICARE | Primary: Geriatric Medicine

## 2021-01-21 ENCOUNTER — Ambulatory Visit
Admit: 2021-01-21 | Discharge: 2021-01-21 | Payer: MEDICARE | Attending: Geriatric Medicine | Primary: Geriatric Medicine

## 2021-01-21 ENCOUNTER — Ambulatory Visit: Attending: Geriatric Medicine | Primary: Geriatric Medicine

## 2021-01-21 DIAGNOSIS — I739 Peripheral vascular disease, unspecified: Secondary | ICD-10-CM

## 2021-01-21 DIAGNOSIS — J40 Bronchitis, not specified as acute or chronic: Secondary | ICD-10-CM

## 2021-01-21 MED ORDER — BENZONATATE 200 MG CAP
200 mg | ORAL_CAPSULE | Freq: Three times a day (TID) | ORAL | 0 refills | Status: AC | PRN
Start: 2021-01-21 — End: 2021-02-20

## 2021-01-21 MED ORDER — GABAPENTIN 300 MG CAP
300 mg | ORAL_CAPSULE | Freq: Three times a day (TID) | ORAL | 4 refills | Status: DC
Start: 2021-01-21 — End: 2021-08-26

## 2021-01-21 NOTE — Progress Notes (Signed)
Progress  Notes by [place], Margarito Courser L at 01/21/21 1000                Author: Arther Dames  Service: --  Author Type: Technician       Filed: 01/21/21 1206  Encounter Date: 01/21/2021  Status: Signed          Editor: Arther Dames (Technician)               ROOM # [id number]      Identified pt with two pt identifiers(name and DOB). Reviewed record in preparation for visit and have obtained necessary documentation.     Chief Complaint       Patient presents with        ?  Hospital Follow Up             SOB  12/23/20        ?  Cough             2 weeks        ?  Annual Wellness Visit         [name] preferred language for health care discussion is English/other.      Is the patient using any DME equipment during OV? LT WALKING BOOT, WALKER      Hunter Higgins is due for:     Health Maintenance Due       Topic        ?  Hepatitis C Screening      ?  COVID-19 Vaccine (1)     ?  Foot Exam Q1      ?  Eye Exam Retinal or Dilated         ?  Colorectal Cancer Screening Combo         Health Maintenance reviewed and discussed per provider   Please order/place referral if appropriate.      1. For patients aged 39-75: Has the patient had a colonoscopy? UNKNOWN          Advance Directive:   1. Do you have an advance directive in place? Patient Reply: NOT ASKED      2. If not, would you like material regarding how to put one in place?  NOT ASKED      Coordination of Care:   1. Have you been to the ER, urgent care clinic since your last visit?  Hospitalized since your last visit? YES-HOSPITAL 12/2020      2. Have you seen or consulted any other health care providers outside of the [place] since your last visit? Include any pap smears or colon screening. PODIATRY, DIALYSIS      Patient is accompanied by WIFE I have received verbal consent from Hunter Higgins to discuss  any/all medical information while they are present in the room.      Learning Assessment:   No flowsheet data found.   Depression  Screening:         3 most recent [facility] Screens  01/21/2021  10/13/2020  09/15/2020  07/23/2020           Little interest or pleasure in doing things  Not at all  Several days  Several days  Not at all           Feeling down, depressed, irritable, or hopeless  Not at all  Several days  Several days  Not at all           Total Score  PHQ 2  0  2  2  0        Abuse Screening:       Abuse Screening Questionnaire  01/21/2021  11/03/2020         Do you ever feel afraid of your partner?  N  N     Are you in a relationship with someone who physically or mentally threatens you?  N  N         Is it safe for you to go home?  Y  Y        Fall Risk         Fall Risk Assessment, last 12 mths  01/21/2021  11/03/2020  09/15/2020  07/23/2020           Able to walk?  Yes  Yes  Yes  Yes     Fall in past 12 months?  0  0  0  0     Do you feel unsteady?  1  1  -  0           Are you worried about falling  0  0  -  0        Recent Travel Screening and Travel History documentation           Travel Screening       No screening recorded since 01/20/21 0000             Travel History    Travel since 12/21/20      No documented travel since 12/21/20

## 2021-01-21 NOTE — Progress Notes (Signed)
Spoke with pt in regards to CXR. Two patient identifier's verified.  Relayed the PCP's note. Pt acknowledges understanding and voices no concerns at this time.

## 2021-01-21 NOTE — Progress Notes (Signed)
Progress Notes by Huston Foley, MD at 01/21/21 1000                Author: Huston Foley, MD  Service: --  Author Type: Physician       Filed: 02/25/21 1101  Encounter Date: 01/21/2021  Status: Addendum          Editor: Huston Foley, MD (Physician)          Related Notes: Original Note by Huston Foley, MD (Physician) filed at 01/21/21 1127               Progress Note      Patient: Hunter Higgins               Sex: male                     Date of Birth:  [birth year]      Age:  70 y.o.                        HPI:        Hunter Higgins is a 70 y.o. male who has been seen for followup of his  Recent Hospital stay for an NSTEMI . He has hypertension , DM , CKD  and diabetic foot ulcer      He has had access for dialysis inserted mid May . Last creatinine was  6 plus. He has started dialysis .     Past Medical History:        Diagnosis  Date         ?  CAD (coronary artery disease)       ?  Diabetes ([place])       ?  Diabetic foot ulcer ([place])            Left         ?  Hypertension       ?  Retinopathy       ?  Vascular complication           ?  Visual impairment               Past Surgical History:         Procedure  Laterality  Date          ?  PR DEBRIDEMENT OPEN WOUND 20 SQ CM<  Left            foot           No family history on file.        Social History          Socioeconomic History         ?  Marital status:  UNKNOWN       Tobacco Use         ?  Smoking status:  Former Smoker              Packs/day:  0.00         Years:  1.00         Pack years:  0.00         Start date:  11/04/1967         Quit date:  11/03/1968         Years since quitting:  52.2         ?  Smokeless tobacco:  Never Used       Vaping Use         ?  Vaping Use:  Never used       Substance and Sexual Activity         ?  Alcohol use:  Yes             Comment: occas         ?  Drug use:  Never              Current Outpatient Medications:    ?  glucometer,pre-loaded strips (GLUCOSE METER, DISP & STRIPS), Dispense based on patient and  insurance preference. Use up to four times daily as directed. (FOR ICD-10 E10.9, E11.9)., Disp: , Rfl:    ?  needles, insulin disposable (INSULIN PEN NEEDLE), Use new needle with each basaglar injection. Dx E11.9, Z79.4, Disp: , Rfl:    ?  multivitamin capsule, Take 1 Tablet by mouth daily., Disp: , Rfl:    ?  propylene glycol (SYSTANE BALANCE OP), Apply 1 Drop to eye as needed., Disp: , Rfl:    ?  blood-glucose meter (RELION ALL-IN-ONE METER), , Disp: , Rfl:    ?  insulin glargine (Basaglar KwikPen U-100 Insulin) 100 unit/mL (3 mL) inpn, 5 Units by SubCUTAneous route., Disp: , Rfl:    ?  melatonin 3 mg tablet, Take 3 mg by mouth., Disp: , Rfl:    ?  ofloxacin (FLOXIN) 0.3 % otic solution, 5 Drops by Otic route daily., Disp: , Rfl:    ?  hydrALAZINE (APRESOLINE) 10 mg tablet, Take 10 mg by mouth every eight (8) hours., Disp: , Rfl:    ?  ampicillin (PRINCIPEN) 500 mg capsule, , Disp: , Rfl:    ?  amoxicillin-clavulanate (AUGMENTIN) 875-125 mg per tablet, Take 1 Tablet by mouth two (2) times a day., Disp: , Rfl:    ?  hydrALAZINE (APRESOLINE) 50 mg tablet, Take 1 Tablet by mouth three (3) times daily., Disp: 180 Tablet, Rfl: 5   ?  gabapentin (NEURONTIN) 300 mg capsule, Take 1 Capsule by mouth three (3) times daily. Max Daily Amount: 900 mg. Indications: neuropathic pain (Patient not taking: Reported on 11/03/2020), Disp: 100 Capsule, Rfl: 0   ?  multivitamin (ONE A DAY) tablet, Take 1 Tablet by mouth daily., Disp: , Rfl:    ?  atorvastatin (LIPITOR) 20 mg tablet, Take  by mouth daily., Disp: , Rfl:    ?  levoFLOXacin (LEVAQUIN) 500 mg tablet, Take  by mouth daily. (Patient not taking: Reported on 10/13/2020), Disp: , Rfl:    ?  clopidogreL (PLAVIX) 75 mg tab, Take  by mouth., Disp: , Rfl:    ?  traZODone (DESYREL) 50 mg tablet, Take  by mouth nightly. (Patient not taking: Reported on 09/15/2020), Disp: , Rfl:    ?  torsemide (DEMADEX) 100 mg tablet, Take  by mouth daily., Disp: , Rfl:    ?  aspirin 81 mg chewable tablet,  Take 81 mg by mouth daily., Disp: , Rfl:    ?  peg 400-propylene glycol (Systane, propylene glycoL,) 0.4-0.3 % drop, as needed. (Patient not taking: Reported on 11/03/2020), Disp: , Rfl:    ?  amLODIPine (NORVASC) 10 mg tablet, Take  by mouth daily., Disp: , Rfl:    ?  furosemide (LASIX) 20 mg tablet, Take  by mouth daily., Disp: , Rfl:          Allergies        Allergen  Reactions         ?  Ace Inhibitors  Cough, Itching and  Other (comments)             cough   Other reaction(s): Cough   cough   cough            ?  Naproxen  Swelling, Anaphylaxis and Shortness of Breath             Other reaction(s): gi distress, Other, Other (See Comments)   Throat swells. cannot breathe, and causes GI distress   REACTION: Throat swells and cannot breathe   anaphylaxis   REACTION: Throat swells and cannot breathe   anaphylaxis   Throat swells. cannot breathe, and causes GI distress              ROS         Physical Exam:         Visit Vitals      BP  (!) 146/56 (BP 1 Location: Right arm, BP Patient Position: Sitting)     Pulse  61     Temp  98.2 ??F (36.8 ??C) (Temporal)     Resp  20     Ht  '5\' 7"'  (1.702 m)     Wt  144 lb 6.4 oz (65.5 kg)     SpO2  98%        BMI  22.62 kg/m??           Physical Exam       Labs Reviewed:   Renal Function Panel [[id number]] (Order [id number])   Lab        Date: 01/07/2021  Department: Generic External Data Department  Ordering: Background, Upgrade  Authorizing: Provider, Generic External Data                [place]     Outside Information     ??     ??(suggestion)?? Information displayed in this report will not trend and will  not trigger automated decision support.           ??Contains abnormal data??Renal Function Panel   Order: [id number]         Ref Range & Units  01/07/21 0611         Sodium  133 - 145 mmol/L  131??Low??          Potassium  3.5 - 5.5 mmol/L  3.9      Chloride  98 - 110 mmol/L  91??Low??      Glucose  70 - 99 mg/dL  112??High??      BUN  6 - 22 mg/dL  40??High??      CO2  20 - 32 mmol/L   24      Creatinine  0.8 - 1.6 mg/dL  6.4??High??      Calcium  8.4 - 10.5 mg/dL  8.1??Low??      Albumin  3.5 - 5.0 g/dL  2.6??Low??      Phosphorus  2.5 - 4.5 mg/dL  3.9      eGFR  >60.0 mL/min/1.73 sq.m.  8.9??Low??                 Assessment/Plan                  ICD-10-CM  ICD-9-CM             1.  PVD (peripheral vascular disease) (HCC)   I73.9  443.9       2.  Essential hypertension   I10  401.9       3.  NSTEMI (  non-ST elevated myocardial infarction) ([place])   I21.4  410.70  REFERRAL TO CARDIOLOGY     4.  Diabetes mellitus due to underlying condition with hyperosmolarity without coma, unspecified whether long term insulin use (HCC)   E08.00  249.20  dulaglutide (Trulicity) 1.60 VP/7.1 mL sub-q pen     5.  Ulcer of foot, limited to breakdown of skin, unspecified laterality ([place])   L97.501  707.15       6.  CKD (chronic kidney disease) stage 4, GFR 15-29 ml/min (HCC)   N18.4  585.4       7.  Anemia due to stage 4 chronic kidney disease ([place])   N18.4  285.21              D63.1  585.4             8.  Bronchitis   J40  490  XR CHEST PA LAT     9.  Medicare annual wellness visit, subsequent   Z00.00  V70.0       10.  Diabetic ulcer of other part of foot associated with diabetes mellitus due to underlying condition, with fat layer exposed, unspecified laterality  ([place])   G62.694  249.80  gabapentin (NEURONTIN) 300 mg capsule            L97.502  707.15             11.  Neuropathy   G62.9  355.9  REFERRAL TO PHYSICAL THERAPY                gabapentin (NEURONTIN) 300 mg capsule           12.  Sudden idiopathic hearing loss of left ear, unspecified hearing status on contralateral side   H91.22  388.2  REFERRAL TO AUDIOLOGY     13.  Weight loss, non-intentional   R63.4  783.21  REFERRAL TO PHYSICAL THERAPY           14.  Muscle atrophy of lower extremity   M62.58  728.2  REFERRAL TO PHYSICAL THERAPY                   Briant Cedar, MD   This is the Subsequent Medicare Annual Wellness Exam, performed 12 months or more after the  Initial AWV or the last Subsequent AWV      I have reviewed the patient's medical history in detail and updated the computerized patient record.            Assessment/Plan     Education and counseling provided:   Are appropriate based on today's review and evaluation      1. PVD (peripheral vascular disease) ([place])   2. Essential hypertension   3. NSTEMI (non-ST elevated myocardial infarction) ([place])   4. Diabetes mellitus due to underlying condition with hyperosmolarity without coma, unspecified whether long term insulin use ([place])   5. Ulcer of foot, limited to breakdown of skin, unspecified laterality ([place])   6. CKD (chronic kidney disease) stage 4, GFR 15-29 ml/min (HCC)   7. Anemia due to stage 4 chronic kidney disease (HCC)            Depression Risk Factor Screening           3 most recent PHQ Screens  10/13/2020        Little interest or pleasure in doing things  Several days     Feeling down, depressed, irritable, or hopeless  Several days  Total Score PHQ 2  2             Alcohol &  Drug Abuse Risk Screen      Do you average more than 1 drink per night or more than 7 drinks a week: No      In the past three months have you have had more than 4 drinks containing alcohol on one occasion: No               Functional Ability and Level of Safety      Hearing : The patient needs further evaluation.        Activities of Daily Living:   The home contains: grab bars   Patient needs help with:  shopping, preparing meals, laundry, managing medications, managing money, bathing and bathroom needs        Ambulation: with difficulty, uses a walker       Fall Risk:      Fall Risk Assessment, last 12 mths  11/03/2020        Able to walk?  Yes     Fall in past 12 months?  0     Do you feel unsteady?  1        Are you worried about falling  0         Abuse Screen:   Patient is not abused            Cognitive Screening      Has your family/caregiver stated any concerns about your memory:  no       Cognitive Screening: Normal  - Mini Cog Test        Health Maintenance Due          Health Maintenance Due        Topic  Date Due         ?  Hepatitis C Screening   Never done     ?  COVID-19 Vaccine (1)  Never done     ?  Foot Exam Q1   Never done     ?  MICROALBUMIN Q1   Never done     ?  Eye Exam Retinal or Dilated   Never done     ?  Colorectal Cancer Screening Combo   Never done         ?  Medicare Yearly Exam   Never done             Patient Care Team     Patient Care Team:   Huston Foley, MD as PCP - General (Internal Medicine Physician)   Huston Foley, MD as PCP - [facility] Empaneled Provider   Griffin Basil, DPM as Physician (Podiatry)   Benjie Karvonen, MD as Consulting Provider (Nephrology)        History          Patient Active Problem List        Diagnosis  Code         ?  Diabetes ([place])  E11.9     ?  Diabetic foot ulcer ([place])  E11.621, L97.509     ?  Visual impairment  H54.7     ?  Vascular complication  Z36.6     ?  Skin ulcer of left foot, limited to breakdown of skin ([place])  L97.521     ?  Diabetic foot infection ([place])  E11.628, L08.9     ?  Uncontrolled type 2 diabetes with retinopathy  ZOX0960     ?  Type 1 diabetes mellitus with proliferative retinopathy of both eyes without macular edema ([place])  A54.0981     ?  Hyperglycemia  R73.9     ?  Vitreous hemorrhage (HCC)  H43.10     ?  Urinary incontinence, stress, male  N39.3     ?  Tympanic membrane rupture  H72.90     ?  Sudden hearing loss, left  H91.22     ?  Status post intraocular lens implant  Z96.1     ?  Shortness of breath  R06.02     ?  Sarcoidosis  D86.9     ?  Regional enteritis of small intestine (HCC)  K50.00     ?  PVD (peripheral vascular disease) (HCC)  I73.9     ?  Pseudophakia, both eyes  Z96.1     ?  Macular atrophy, retinal  H35.89     ?  Macular degeneration (senile) of retina  H35.30     ?  Moderate protein-calorie malnutrition ([place])  E44.0     ?  Optic atrophy of both eyes  H47.20     ?  Partial small bowel obstruction (HCC)  K56.600     ?  Peripheral  edema  R60.9     ?  Pharyngitis  J02.9     ?  Fluid overload  E87.70     ?  Hyperkalemia  E87.5     ?  Hypokalemia  E87.6     ?  Ileus following gastrointestinal surgery (HCC)  K91.89, K56.7     ?  Impotence of organic origin  N52.9     ?  Lens replaced  Z96.1     ?  Essential hypertension  I10     ?  Esophageal reflux  K21.9     ?  Dyslipidemia  E78.5     ?  Diabetic retinopathy associated with type 2 diabetes mellitus ([place])  E11.319     ?  Stage 3 chronic kidney disease ([place])  N18.30     ?  CRI (chronic renal insufficiency), stage 4 (severe) (HCC)  N18.4     ?  Cough  R05.9     ?  Complete atrioventricular block (HCC)  I44.2     ?  Chest pain  R07.9     ?  Abdominal pain  R10.9     ?  Charcot's joint of foot, left  M14.672     ?  Charcot foot due to diabetes mellitus ([place])  E11.610     ?  Bronchospasm, acute  J98.01     ?  Bilateral impacted cerumen  H61.23     ?  Adrenal insufficiency (HCC)  E27.40     ?  Acute renal failure with acute tubular necrosis superimposed on stage 4 chronic kidney disease (HCC)  N17.0, N18.4     ?  Acute diastolic HF (heart failure) (HCC)  I50.31     ?  Abnormal EKG  R94.31     ?  NSTEMI (non-ST elevated myocardial infarction) ([place])  I21.4         ?  Severe protein-calorie malnutrition ([place])  E43          Past Medical History:        Diagnosis  Date         ?  CAD (coronary artery disease)       ?  Diabetes ([place])       ?  Diabetic foot ulcer ([place])            Left         ?  Hypertension       ?  Retinopathy       ?  Vascular complication           ?  Visual impairment             Past Surgical History:         Procedure  Laterality  Date          ?  PR DEBRIDEMENT OPEN WOUND 20 SQ CM<  Left            foot          Current Outpatient Medications          Medication  Sig  Dispense  Refill           ?  glucometer,pre-loaded strips (GLUCOSE METER, DISP & STRIPS)  Dispense based on patient and insurance preference. Use up to four times daily as directed. (FOR ICD-10 E10.9, E11.9).         ?   needles, insulin disposable (INSULIN PEN NEEDLE)  Use new needle with each basaglar injection. Dx E11.9, Z79.4         ?  propylene glycol (SYSTANE BALANCE OP)  Apply 1 Drop to eye as needed.         ?  insulin glargine (Basaglar KwikPen U-100 Insulin) 100 unit/mL (3 mL) inpn  5 Units by SubCUTAneous route.         ?  ofloxacin (FLOXIN) 0.3 % otic solution  5 Drops by Otic route daily.         ?  dulaglutide (Trulicity) 4.25 ZD/6.3 mL sub-q pen  0.75 mg by SubCUTAneous route every seven (7) days.         ?  hydrALAZINE (APRESOLINE) 50 mg tablet  Take 1 Tablet by mouth three (3) times daily.  180 Tablet  5     ?  atorvastatin (LIPITOR) 20 mg tablet  Take  by mouth daily.         ?  clopidogreL (PLAVIX) 75 mg tab  Take  by mouth.         ?  traZODone (DESYREL) 50 mg tablet  Take  by mouth nightly.         ?  torsemide (DEMADEX) 100 mg tablet  Take  by mouth daily.         ?  aspirin 81 mg chewable tablet  Take 81 mg by mouth daily.         ?  amLODIPine (NORVASC) 10 mg tablet  Take  by mouth daily.         ?  multivitamin capsule  Take 1 Tablet by mouth daily.         ?  blood-glucose meter (RELION ALL-IN-ONE METER)           ?  melatonin 3 mg tablet  Take 3 mg by mouth. (Patient not taking: Reported on 01/21/2021)               ?  furosemide (LASIX) 20 mg tablet  Take  by mouth daily.              Allergies        Allergen  Reactions         ?  Ace Inhibitors  Cough, Itching and Other (comments)  cough   Other reaction(s): Cough   cough   cough            ?  Naproxen  Swelling, Anaphylaxis and Shortness of Breath             Other reaction(s): gi distress, Other, Other (See Comments)   Throat swells. cannot breathe, and causes GI distress   REACTION: Throat swells and cannot breathe   anaphylaxis   REACTION: Throat swells and cannot breathe   anaphylaxis   Throat swells. cannot breathe, and causes GI distress              No family history on file.     Social History          Tobacco Use         ?  Smoking  status:  Former Smoker              Packs/day:  0.00         Years:  1.00         Pack years:  0.00         Start date:  11/04/1967         Quit date:  11/03/1968         Years since quitting:  52.2         ?  Smokeless tobacco:  Never Used       Substance Use Topics         ?  Alcohol use:  Yes             Comment: occas              Huston Foley, MD

## 2021-01-22 NOTE — Telephone Encounter (Signed)
Patient is calling in stating his Ins will not cover the Tessalon cough medication and he would have a $100 copay.  Asking if something different can please be called in to his pharmacy today.

## 2021-02-04 ENCOUNTER — Encounter

## 2021-02-04 ENCOUNTER — Encounter: Attending: Geriatric Medicine | Primary: Geriatric Medicine

## 2021-02-04 NOTE — Telephone Encounter (Signed)
Pt would like to have a rollator walker ordered for himself because walking with his 2 wheeler walker in the gravel at his Dialysis appt. Is causing problems. Send to DME company that works with his insurance  Pt. can be reached at [phone]

## 2021-02-18 ENCOUNTER — Encounter: Payer: MEDICARE | Primary: Geriatric Medicine

## 2021-02-18 DIAGNOSIS — M6281 Muscle weakness (generalized): Secondary | ICD-10-CM

## 2021-02-18 NOTE — Progress Notes (Signed)
[facility]   [address] [place], [place] [address]  Phone: [phone] Fax: [phone]  DISCHARGE SUMMARY FOR PHYSICAL THERAPY          Patient Name: Hunter Higgins DOB: 1951-02-23   Treatment/Medical Diagnosis: Muscle weakness (generalized) [M62.81]  Other abnormalities of gait and mobility [R26.89]   Onset Date:  2 months before IE    Referral Source: Greig Right, MD Start of Care [facility]): 02/18/21   Prior Hospitalization: See Medical History Provider #: [id number]   Prior Level of Function: WNL   Comorbidities: DM II, PVD, HTN, Low-vision; DM ulcers, DM neuropathy, DM nepropathy, L shoulder surgery with restricted ROM resulting; R chest dialysis port;   Medications: Verified on Patient Summary List   Visits from [facility]: 1 Missed Visits: 3       Key Functional Changes/Progress: pt was seen for his IE on 02/18/21 and given HEP and pt education. Prognosis was good for recovery due to motivation and normal PLOF with no limiations in physical function/ mobility. He did have a complex medical history and difficulty with transportation due to low vision. He did not return for treatment and will be DC at this time.     Goals for this period:  NO GOALS MET AS PT CANCELLED OR NO-SHOWED FOR 3 APPTS AND WAS REMOVED FROM SCHEDULE DUE TO OUR NON-COMPLIANCE POLICY    Short Term Goals: To be accomplished in  2  treatments:  1. Pt will be I and compliant with HEP to improve deficits and return pt to PLOF  Status at last assessment: initiate at IE  Long Term Goals: To be accomplished in  8-10  treatments:  1. Pt will be I and compliant with HEP to prepare for D/C  Status at last assessment: initiated at IE and will progress as needed until DC  2. Pt will have an increase in 2 min walk test to > or =  163 feet independently and safe to restore PLOF  Status at last assessment: 59' with FWW  3. Pt will have a decrease in TUG time, independently to < or = 12" to improve  return to PLOF  Status at last assessment: 19" with CAM boot   4. Pt will have an increase in 30" sit to stand test to > or = 13x with no UE assist and improved quad control (no hyperextension) to demo safety and increased functional ability   Status at last assessment: 11x with no UE assist, (B) knee hyperextension and use of table behind legs, does not fully stand  5. Pt will have an increase in gross UE and LE strength to > or = 4+/5 all planes to restore PLOF, gait, bed mobility, t/f  Status at last: 4/5   6. Pt will be able to squat to floor to pick up a small object and return to stand with 1 UE assist for balnace only to improve home care/self-care  Status at last: mod A for return to stand     Assessments/Recommendations: Discontinue therapy due to lack of attendance or compliance.  If you have any questions/comments please contact us directly at [phone]   Thank you for allowing [place] to assist in the care of your patient.  Therapist Signature: Ma Rings, DPT Date: 04/06/21   Reporting Period:  Certification Period: 02/18/21 to 02/18/21  02/18/21 to 05/19/10 Time: 8:13 AM     Greig Right, MD

## 2021-02-18 NOTE — Progress Notes (Signed)
 [facility] AT [facility]   [address] [place], [place] [address]  Phone: [phone] Fax: [phone]  PLAN OF CARE / STATEMENT OF MEDICAL NECESSITY FOR PHYSICAL THERAPY SERVICES  Patient Name: Hunter Higgins DOB: 11-04-1950   Medical   Diagnosis: Muscle weakness (generalized) [M62.81]  Other abnormalities of gait and mobility [R26.89] Treatment Diagnosis: Generalized full body weakness    Onset Date: 2 months ago      Referral Source: Jason Franky SQUIBB, MD Start of Care [facility]): 02/18/2021   Prior Hospitalization: See medical history Provider #: [id number]   Prior Level of Function: WNL   Comorbidities: DM II, PVD, HTN, Low-vision; DM ulcers, DM neuropathy, DM nepropathy, L shoulder surgery with restricted ROM resulting; R chest dialysis port;    Medications: Verified on Patient Summary List   The Plan of Care and following information is based on the information from the initial evaluation.   ========================================================================  Assessment / key information:  Pt is a 70 yo male with CC Of generalized weakness. He was seen in the hospital for CHF 2 months ago and then began dialysis noting a 25-35# weight loss and weakness. Pt presents using a FWW for all community ambulation. Gross strength in UE and LE is 4/5 but pt demo's generalized fatigue after light exercises.  ModA for squat to stand. TUG independent: 19. modCTSIB demo's impairments in EC situations. Balance is limited at this time due to use of CAM boot on the (L) for healing diabetic ulcers. Prognosis is good and pt motivation is good to DC the walker for all gait and increase all safety in home and community.  Pt will benefit from PT to address deficits, improve safety and allow for return to PLOF.    ========================================================================  Eval Complexity: History: HIGH Complexity :3+ comorbidities / personal factors will impact the  outcome/ POC Exam:HIGH Complexity : 4+ Standardized tests and measures addressing body structure, function, activity limitation and / or participation in recreation  Presentation: LOW Complexity : Stable, uncomplicated  Clinical Decision Making:MEDIUM Complexity : FOTO score of 26-74Overall Complexity:MEDIUM  Problem List: pain affecting function, decrease ROM, decrease strength, impaired gait/ balance, decrease ADL/ functional abilitiies, decrease activity tolerance, decrease flexibility/ joint mobility and decrease transfer abilities   Treatment Plan may include any combination of the following: Therapeutic exercise, Therapeutic activities, Neuromuscular re-education, Physical agent/modality, Gait/balance training, Manual therapy, Patient education, Self Care training, Functional mobility training, Home safety training and Stair training  Vasopnuematic compression justification:  Per bilateral girth measures taken and listed above the edema is considered significant and having an impact on the patient's strength, balance, gait and transfers  Patient / Family readiness to learn indicated by: asking questions, trying to perform skills and interest  Persons(s) to be included in education: patient (P)  Barriers to Learning/Limitations: yes;  sensory deficits-vision/hearing/speech  Measures taken:    Patient Goal (s): walk independently again; get strong   Patient self reported health status: poor  Rehabilitation Potential: good  . Short Term Goals: To be accomplished in  2  treatments:  1. Pt will be I and compliant with HEP to improve deficits and return pt to PLOF  Status at last assessment: initiate at IE  . Long Term Goals: To be accomplished in  8-10  treatments:  1. Pt will be I and compliant with HEP to prepare for D/C  Status at last assessment: initiated at IE and will progress as needed until  DC  2. Pt will have an increase in 2 min walk test to > or =  163 feet independently and safe to restore  PLOF  Status at last assessment: 50' with FWW  3. Pt will have a decrease in TUG time, independently to < or = 12 to improve return to PLOF  Status at last assessment: 19 with CAM boot   4. Pt will have an increase in 30 sit to stand test to > or = 13x with no UE assist and improved quad control (no hyperextension) to demo safety and increased functional ability   Status at last assessment: 11x with no UE assist, (B) knee hyperextension and use of table behind legs, does not fully stand  5. Pt will have an increase in gross UE and LE strength to > or = 4+/5 all planes to restore PLOF, gait, bed mobility, t/f  Status at last: 4/5   6. Pt will be able to squat to floor to pick up a small object and return to stand with 1 UE assist for balnace only to improve home care/self-care  Status at last: mod A for return to stand      Frequency / Duration:   Patient to be seen  2  times per week for 8-10  treatments:  (All LTG as above will be assessed and updated every 10 visits or 30 days and progressed as needed)  Patient / Caregiver education and instruction: self care, activity modification and exercises  Therapist Signature: Powell Shin, DPT Date: 02/18/2021   Certification Period: 02/18/21 to 05/19/21 Time: 11:14 AM   ========================================================================  I certify that the above Physical Therapy Services are being furnished while the patient is under my care.  I agree with the treatment plan and certify that this therapy is necessary.  Physician Signature:        Date:       Time:   Please sign and return to [facility] or you may fax the signed copy to [phone].  Thank you.  Jason Franky SQUIBB, MD

## 2021-02-18 NOTE — Progress Notes (Signed)
 PT DAILY TREATMENT NOTE 8-14    Patient Name: Hunter Higgins  Date:02/18/2021  DOB: 12-30-1950  [x]   Patient DOB Verified  Payor: Advertising copywriter MEDICARE / Plan: [employer] MEDICARE ADVANTAGE / Product Type: Managed Care Medicare /    In time:2:13  Out time:3:04  Total Treatment Time (min): 51  Total Timed Codes (min): 15  1:1 Treatment Time (min): 51   Visit #: 1 of 8-10    Treatment Area: Muscle weakness (generalized) [M62.81]  Other abnormalities of gait and mobility [R26.89]    SUBJECTIVE  Pain Level (0-10 scale):C: 0, B: 0, W: 0  [] constant [] intermittent [] improving [] worsening [] no change since onset  Worsens: no pain   Eases: no pain   Current symptoms/Complaints:  Pt was hospitalized 2 months ago secondary to CHF. He began Dialysis 1.5 months ago for DM nepropathy. Pt lost 25-35# and now presents with weakness and atrophy.  Pt also presents with L walking boot for the past (approx) 3 months due to DM foot ulcers.  Pt ambulates with FWW for all community ambulation or when rolling cart is not available.         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   Pt Goals: get stronger again; walk again without walker   Barriers: [] pain [] financial [] time [] transportation [] other Dialysis on M/W/F  Motivation: great motivation to recover function   Substance use: not specified    OBJECTIVE    Vasopnuematic compression justification:  Per bilateral girth measures taken and listed above the edema is considered significant and having an impact on the patient's functional mobility and self-care    15 min Therapeutic Exercise:  [x]  See flow sheet :   Rationale: increase ROM and  increase strength to improve the patient's ability to walk, stand, bed mobility, ADLS, self-care        x min Patient Education: [x]  Review HEP    []  Progressed/Changed HEP based on:    Initiate HEP  Discuss POC  NO balance at home  Given HEP and reviewed as much as possible  Pt with difficulty reading small print - prefers visual demonstrations.   QR code given to patient but he does not have internet on his phone until 7/13 when he pays his bill. Instructed him on how to visit the med bridge software online  -anticipate PT will need further cues NV on HEP.     []  positioning   []  body mechanics   []  transfers   []  heat/ice application        Other Objective/Functional Measures:     OBJECTIVE  Posture: T/S kyphosis/dowager's hump in upper T/S, Forward head,      Gait: slow with FWW; pt with walking boot on the L  Step through pattern;  Independent ambulation with imbalance due to walking boot on the L. Torso shift to account for imbalances.     Shoulder AROM: grossly WFL but at approx 150 deg   Elbow AROM: WNL  HIp AROM: WNL  Knee AROM : WNL  Ankle: CAM boot on the L; restricted gastroc (B)     Neuro Screen []  WNL  Sensation: NT  Reflexes: NT      Strength  UE:  Grossly 4/5   tricep push up: 15x  Gross LE strength 4/5  Bridge: 100%, reduced S&S w/ bridge      FUNCTIONAL TESTS:  30 sit to stand: 11x ewith no UE assist, (B) knee hyperextension and use of table behind legs, does not fully stand  Bed mobility: independent but slow; all rolling   TUG: 18, 19  Independently, min imbalance and able to self-correct with sbAx1; with CAM boot   2 min walk test ( ): 134 feet with FWW  Squat: forward weight shift, heels up due to gastroc tightness, pt unable to return to stand without modAx1    ModCTSIB:   ROM EO: 30   ROM EC: 14   ROM EO foam:  30  ROM EC foam:  6     Sh ROM: L 4, R 2    Other tests/comments:              FOTO: TC             Pain Level (0-10 scale) post treatment: 0    ASSESSMENT/Changes in  Function: see IE    Patient will continue to benefit from skilled PT services to modify and progress therapeutic interventions, address functional mobility deficits, address ROM deficits, address strength deficits, analyze and address soft tissue restrictions, analyze and cue movement patterns, analyze and modify body mechanics/ergonomics, assess and modify postural abnormalities, address imbalance/dizziness and instruct in home and community integration to attain remaining goals.     [x]   See Plan of Care  []   See progress note/recertification  []   See Discharge Summary         Progress towards goals / Updated goals:  SEE IE FOR GOALS     PLAN  []   Upgrade activities as tolerated     []   Continue plan of care  []   Update interventions per flow sheet       []   Discharge due to:_  [x]   Other:Initiate POC as stated in the IE      Justification for Eval Code Complexity:  Patient History : DM, CHF, dialysis, low vision, L hearing impaired, neuropathy, major weight loss, atrophy  Examination see IE  Clinical Presentation: stable - weakness; medically less stable however  Clinical Decision Making : FOTO NT due to time constraints; moderate complexity overall due to clinician judgement     Powell Shin, DPT 02/18/2021  11:14 AM    Future Appointments   Date Time Provider Department Center   02/18/2021  2:00 PM Shin Powell HERO, PT [facility]   02/25/2021 11:00 AM Jason Franky SQUIBB, MD [facility]

## 2021-02-23 ENCOUNTER — Encounter: Payer: MEDICARE | Primary: Geriatric Medicine

## 2021-02-25 ENCOUNTER — Inpatient Hospital Stay: Payer: MEDICARE | Primary: Geriatric Medicine

## 2021-02-25 ENCOUNTER — Encounter: Attending: Geriatric Medicine | Primary: Geriatric Medicine

## 2021-03-02 ENCOUNTER — Inpatient Hospital Stay: Payer: MEDICARE | Primary: Geriatric Medicine

## 2021-03-04 ENCOUNTER — Encounter: Payer: MEDICARE | Attending: Geriatric Medicine | Primary: Geriatric Medicine

## 2021-03-04 ENCOUNTER — Encounter: Payer: MEDICARE | Primary: Geriatric Medicine

## 2021-03-09 ENCOUNTER — Inpatient Hospital Stay: Payer: MEDICARE | Primary: Geriatric Medicine

## 2021-03-10 NOTE — Telephone Encounter (Signed)
[facility] called stating that they can not get in touch with the pt. The phone number is not working. It is urgent that the pt sets up an appt. with audiology, the pt would need to call [facility] to set up an appt.

## 2021-03-11 ENCOUNTER — Encounter: Payer: MEDICARE | Primary: Geriatric Medicine

## 2021-03-16 ENCOUNTER — Encounter: Payer: MEDICARE | Primary: Geriatric Medicine

## 2021-03-18 ENCOUNTER — Encounter: Payer: MEDICARE | Primary: Geriatric Medicine

## 2021-04-29 ENCOUNTER — Encounter: Attending: Cardiovascular Disease | Primary: Geriatric Medicine

## 2021-05-11 ENCOUNTER — Ambulatory Visit: Admit: 2021-05-11 | Payer: MEDICARE | Attending: Cardiovascular Disease | Primary: Geriatric Medicine

## 2021-05-11 ENCOUNTER — Ambulatory Visit: Attending: Cardiovascular Disease | Primary: Geriatric Medicine

## 2021-05-11 DIAGNOSIS — I10 Essential (primary) hypertension: Secondary | ICD-10-CM

## 2021-05-11 MED ORDER — ISOSORBIDE MONONITRATE SR 30 MG 24 HR TAB
30 mg | ORAL_TABLET | ORAL | 2 refills | Status: AC
Start: 2021-05-11 — End: ?

## 2021-05-11 MED ORDER — ASPIRIN 81 MG TAB, DELAYED RELEASE
81 mg | ORAL_TABLET | Freq: Every day | ORAL | 2 refills | Status: AC
Start: 2021-05-11 — End: ?

## 2021-05-11 MED ORDER — METOPROLOL SUCCINATE SR 25 MG 24 HR TAB
25 mg | ORAL_TABLET | Freq: Every day | ORAL | 2 refills | Status: AC
Start: 2021-05-11 — End: ?

## 2021-05-11 NOTE — Progress Notes (Signed)
Progress Notes by Leland Her, MD at 05/11/21 1545                Author: Leland Her, MD  Service: --  Author Type: Physician       Filed: 05/11/21 1621  Encounter Date: 05/11/2021  Status: Signed          Editor: Leland Her, MD (Physician)                                                                            [facility]      Hunter Higgins is 70 year old male with a history of CAD, diabetes, end-stage renal disease on dialysis, hypertension    Patient is here today for cardiac evaluation    Patient had history of non-STEMI in 12/2020.  During that hospitalization  patient also was found to have acute on chronic kidney disease requiring dialysis.  Patient had a cardiac catheterization in 12/2020 which showed severe two-vessel CAD involving RCA and circumflex which was diffuse in nature and multiple serial lesions  throughout the vessels as well as moderate LAD disease.  Lesions were not suitable for PCI or surgical revascularization.  Medical management was recommended.      Patient states that he has been taking his medication as prescribed.  He has ran out of aspirin.  He said he has constant pain under his left rib cage which is worse when he is pressing with the finger on rib.  No exertional symptoms.  He is on dialysis  3 times a week.  He denies any PND or lower extremity swelling   Denies any nausea, vomiting, abdominal pain, fever, chills, sputum production. No hematuria or other bleeding complaints        Past Medical History:        Diagnosis  Date         ?  CAD (coronary artery disease)       ?  Diabetes ([place])       ?  Diabetic foot ulcer ([place])            Left         ?  Dialysis patient [facility])       ?  ESRD (end stage renal disease) ([place])       ?  Foot ulcer ([place])       ?  Hypertension       ?  Retinopathy           ?  Visual impairment             Review of Systems:   Cardiac symptoms as noted above in HPI. All others negative.   Denies fatigue, malaise, skin rash,  joint pain, blurring vision, photophobia, neck pain, hemoptysis, chronic cough, nausea, vomiting, hematuria, burning micturition, BRBPR, chronic headaches.        Current Outpatient Medications        Medication  Sig         ?  glucometer,pre-loaded strips (GLUCOSE METER, DISP & STRIPS)  Dispense based on patient and insurance preference. Use up to four times daily as directed. (FOR ICD-10 E10.9, E11.9).     ?  needles, insulin  disposable (INSULIN PEN NEEDLE)  Use new needle with each basaglar injection. Dx E11.9, Z79.4     ?  multivitamin capsule  Take 1 Tablet by mouth daily.     ?  propylene glycol (SYSTANE BALANCE OP)  Apply 1 Drop to eye as needed.     ?  blood-glucose meter (RELION ALL-IN-ONE METER)       ?  insulin glargine (Basaglar KwikPen U-100 Insulin) 100 unit/mL (3 mL) inpn  5 Units by SubCUTAneous route.     ?  melatonin 3 mg tablet  Take 3 mg by mouth. (Patient not taking: Reported on 01/21/2021)     ?  ofloxacin (FLOXIN) 0.3 % otic solution  5 Drops by Otic route daily.     ?  dulaglutide (Trulicity) [id number] mL sub-q pen  0.75 mg by SubCUTAneous route every seven (7) days.     ?  gabapentin (NEURONTIN) 300 mg capsule  Take 1 Capsule by mouth three (3) times daily. Max Daily Amount: 900 mg. Indications: neuropathic pain     ?  hydrALAZINE (APRESOLINE) 50 mg tablet  Take 1 Tablet by mouth three (3) times daily.     ?  atorvastatin (LIPITOR) 20 mg tablet  Take  by mouth daily.     ?  clopidogreL (PLAVIX) 75 mg tab  Take  by mouth.     ?  traZODone (DESYREL) 50 mg tablet  Take  by mouth nightly.     ?  torsemide (DEMADEX) 100 mg tablet  Take  by mouth daily.     ?  aspirin 81 mg chewable tablet  Take 81 mg by mouth daily.     ?  amLODIPine (NORVASC) 10 mg tablet  Take  by mouth daily.         ?  furosemide (LASIX) 20 mg tablet  Take  by mouth daily.          No current facility-administered medications for this visit.             Past Surgical History:         Procedure  Laterality  Date          ?  HX  PROSTATE SURGERY         ?  PR DEBRIDEMENT OPEN WOUND 20 SQ CM<  Left            foot           Allergies and Sensitivities:     Allergies        Allergen  Reactions         ?  Ace Inhibitors  Cough, Itching and Other (comments)             cough   Other reaction(s): Cough   cough   cough            ?  Naproxen  Swelling, Anaphylaxis and Shortness of Breath             Other reaction(s): gi distress, Other, Other (See Comments)   Throat swells. cannot breathe, and causes GI distress   REACTION: Throat swells and cannot breathe   anaphylaxis   REACTION: Throat swells and cannot breathe   anaphylaxis   Throat swells. cannot breathe, and causes GI distress              Family History:   No family history on file.      Social History:     Social History  Tobacco Use         ?  Smoking status:  Former              Packs/day:  0.00         Years:  1.00         Pack years:  0.00         Types:  Cigarettes         Start date:  11/04/1967         Quit date:  11/03/1968         Years since quitting:  52.5         ?  Smokeless tobacco:  Never       Vaping Use         ?  Vaping Use:  Never used       Substance Use Topics         ?  Alcohol use:  Yes             Comment: occas         ?  Drug use:  Never        He  reports that he quit smoking about 52 years ago. He started smoking about 53 years ago. He has never used smokeless tobacco.  He  reports current alcohol use.      Physical Exam:     BP Readings from Last 3 Encounters:        05/11/21  138/88     01/21/21  (!) 143/57        11/03/20  (!) 188/74              Pulse Readings from Last 3 Encounters:        01/21/21  61     11/03/20  63        10/13/20  62               Wt Readings from Last 3 Encounters:        05/11/21  68 kg (150 lb)     01/21/21  65.5 kg (144 lb 6.4 oz)        11/03/20  74 kg (163 lb 3.2 oz)           Constitutional: Oriented to person, place, and time.    HENT: Head: Normocephalic and atraumatic.    Neck: No JVD present.    Cardiovascular:  Regular rhythm.   No murmur, gallop or rubs appreciated   Lung: Breath sounds normal. No respiratory distress. No ronchi or rales appreciated   Abdominal: No tenderness. No rebound and no guarding.   Musculoskeletal: There is no lower extremity edema. No cynosis  Lymphadenopathy:  No cervical or supraclavicular adenopathy appriciated.   Neurological: No gross motor deficit noted.   Skin: No visible skin rash noted.    No Ear discharge noted  Psychiatric: Normal mood and affect.       LABS:    @     Lab Results         Component  Value  Date/Time            WBC  4.4 (L)  10/13/2020 03:20 PM       HGB  8.5 (L)  10/13/2020 03:20 PM       HCT  28.2 (L)  10/13/2020 03:20 PM       PLATELET  202  10/13/2020 03:20 PM  MCV  63.4 (L)  10/13/2020 03:20 PM          Lab Results         Component  Value  Date/Time            Sodium  139  10/13/2020 03:20 PM       Potassium  4.9  10/13/2020 03:20 PM       Chloride  105  10/13/2020 03:20 PM       CO2  26  10/13/2020 03:20 PM       Glucose  153 (H)  10/13/2020 03:20 PM       BUN  54 (H)  10/13/2020 03:20 PM            Creatinine  3.68 (H)  10/13/2020 03:20 PM        No flowsheet data found.     Lab Results         Component  Value  Date/Time            ALT (SGPT)  29  07/23/2020 02:34 PM          Lab Results         Component  Value  Date/Time            Hemoglobin A1c (POC)  6.9  10/13/2020 02:40 PM        No results found for: TSH, TSH2, TSH3, TSHP, TSHEXT      EKG:   05/11/2021: Sinus rhythm at 66 bpm.  LVH with secondary ST-T changes.      ECHO (12/2020)   CONCLUSIONS     * Left ventricular chamber size is normal.     * Moderate pulmonary hypertension, estimated pulmonary arterial systolic pressure is 65 mmHg.      * Left ventricular systolic function is mildly reduced with an ejection fraction of 51 % by Simpson's biplane.     * There is mild posterior wall hypertrophy.     * Left ventricular diastolic function: Grade III diastolic dysfunction.      * Right  ventricular chamber size is normal with mildly reduced free wall systolic function. FAC is 29%.     * There is mild, eccentric aortic valve regurgitation.     * Definity used for delineation of endocardial borders.       CATHETERIZATION (12/2020)   Coronary Arteries:   LM Short in length and widely patent   LAD: Proximal segment contains mild luminal irregularities. Mid vessel contains a moderate diffuse 40%  stenosis. Distal vessel contains luminal irregularities. Large first diagonal branch. Proximal diffuse 80 to 90% stenosis.   LCx: Proximal hazy 90% stenosis. Very small first OMB with diffuse irregularities. Moderate to large second obtuse marginal  branch with diffuse disease as well as multiple 90% stenoses. Ongoing vessel in the AV groove contains a 90% stenosis.  RCA: Dominant, moderate size vessel. Proximal diffuse irregularities. Mid vessel 90% stenosis. Distal 90% stenosis just proximal  to the distal bifurcation. Posterior descending artery and posterolateral branches are relatively small and diffusely diseased.  Coronary Intervention: None   Left ventriculogram: EF 55% with mild diffuse hypokinesis. Mild mitral regurgitation      IMPRESSION & PLAN:   Hunter Higgins is 70 year old male    CAD:  Non-STEMI in 12/2020   LHC in 12/2020: Severe diffuse LCx and RCA disease and moderate LAD disease.  Because of diffuse nature, medical management was recommended.   Currently on aspirin and Plavix.  Ran out  of aspirin so I have asked him to start taking aspirin.  Currently on amlodipine.  Because of not intervene able CAD, I will start patient on Toprol-XL 25 g daily and Imdur 30 mg daily.   Does not appear to have any angina.  Left rib cage pain is reproducible on exam which is musculoskeletal in nature reassured patient   Continue statin    Hyperlipidemia: Continue atorvastatin 20 mg daily.  Will recommend increasing on next visit     Hypertension: BP 138/88.  Currently on hydralazine and amlodipine.  Will  discontinue hydralazine and starting Toprol-XL 25 gradually and Imdur 30 mg daily for CAD   BP check again in 3 weeks.  He was advised to keep checking blood pressure at home and during dialysis session    End-stage renal disease : On dialysis since 12/2020.  Defer to nephrology team      This plan was discussed with patient who is in agreement.      Thank you for allowing me to participate in patient care. Please feel free to call me if you have any question or concern.       Aileen Pilot, MD   Please note: This document has been produced using voice recognition software. Unrecognized errors in transcription may be present.

## 2021-05-11 NOTE — Progress Notes (Signed)
Identified pt with two pt identifiers(name and DOB). Reviewed record in preparation for visit and have obtained necessary documentation.    Tod Persia presents today for   Chief Complaint   Patient presents with    New Patient       Pt c/o  CHEST PAIN/ PRESSURE, headaches.             Tod Persia preferred language for health care discussion is english/other.    Personal Protective Equipment:   Tree surgeon was used including: mask-surgical and hands-gloves. Patient was placed on no precaution(s). Patient was masked.    Precautions:   Patient currently on None  Patient currently roomed with door closed.    Is someone accompanying this pt? no    Is the patient using any DME equipment during [place]? Yes, walker    Depression Screening:  3 most recent PHQ Screens 05/11/2021   Little interest or pleasure in doing things Not at all   Feeling down, depressed, irritable, or hopeless Not at all   Total Score PHQ 2 0       Learning Assessment:  No flowsheet data found.    Abuse Screening:  Abuse Screening Questionnaire 01/21/2021   Do you ever feel afraid of your partner? N   Are you in a relationship with someone who physically or mentally threatens you? N   Is it safe for you to go home? Y       Fall Risk  Fall Risk Assessment, last 12 mths 01/21/2021   Able to walk? Yes   Fall in past 12 months? 0   Do you feel unsteady? 1   Are you worried about falling 0       Pt currently taking Anticoagulant therapy? no  Pt currently taking Antiplatelet therapy? no    Coordination of Care:  1. Have you been to the ER, urgent care clinic since your last visit? Hospitalized since your last visit? no    2. Have you seen or consulted any other health care providers outside of the [facility] since your last visit? Include any pap smears or colon screening. no      Please see Red banners under Allergies and Med Rec to remove outside inquires. All correct information has been verified with patient and added to  chart.     Medication's patient's would liked removed has been marked not taking to be removed per Verbal order and read back per Leland Her, MD

## 2021-06-01 ENCOUNTER — Ambulatory Visit: Payer: MEDICARE | Primary: Geriatric Medicine

## 2021-06-04 ENCOUNTER — Encounter: Primary: Geriatric Medicine

## 2021-08-25 NOTE — Progress Notes (Signed)
Complex Case management Outreach    Ambulatory Care Coordination    1st attempt to reach patient.  Radene Knee, LPN

## 2021-08-26 ENCOUNTER — Encounter

## 2021-08-26 MED ORDER — GABAPENTIN 300 MG CAP
300 mg | ORAL_CAPSULE | Freq: Three times a day (TID) | ORAL | 0 refills | Status: AC
Start: 2021-08-26 — End: ?

## 2021-08-26 NOTE — Progress Notes (Signed)
Complex Case Management      Date/Time:  08/26/2021 3:55 PM    Method of communication with patient:phone    LPN Care Coordinator(LPN CC) contacted the patient by telephone to perform Ambulatory Care Coordination. Verified name and DOB (PHI) with patient as identifiers. Provided introduction to self, and explanation of the LPN CC's role.     Patient notified that prescriptions was sent to his pharmacy.    Top Challenges reviewed with the Provider   N/a  N/a     The patient agrees to contact the PCP office or the LPN CC for questions related to their healthcare. Provided contact information for future reference.         Current Outpatient Medications   Medication Sig    gabapentin (NEURONTIN) 300 mg capsule Take 1 Capsule by mouth three (3) times daily. Max Daily Amount: 900 mg. Indications: neuropathic pain    aspirin delayed-release 81 mg tablet Take 1 Tablet by mouth daily.    metoprolol succinate (TOPROL-XL) 25 mg XL tablet Take 1 Tablet by mouth daily.    isosorbide mononitrate ER (IMDUR) 30 mg tablet Take 1 Tablet by mouth every morning.    glucometer,pre-loaded strips (GLUCOSE METER, DISP & STRIPS) Dispense based on patient and insurance preference. Use up to four times daily as directed. (FOR ICD-10 E10.9, E11.9).    needles, insulin disposable (INSULIN PEN NEEDLE) Use new needle with each basaglar injection. Dx E11.9, Z79.4    multivitamin capsule Take 1 Tablet by mouth daily.    propylene glycol (SYSTANE BALANCE OP) Apply 1 Drop to eye as needed.    blood-glucose meter (RELION ALL-IN-ONE METER)     insulin glargine (LANTUS,BASAGLAR) 100 unit/mL (3 mL) inpn 5 Units by SubCUTAneous route.    melatonin 3 mg tablet Take 3 mg by mouth. (Patient not taking: No sig reported)    ofloxacin (FLOXIN) 0.3 % otic solution 5 Drops by Otic route daily.    dulaglutide (Trulicity) 9.32 TF/5.7 mL sub-q pen 0.75 mg by SubCUTAneous route every seven (7) days.    atorvastatin (LIPITOR) 20 mg tablet Take  by mouth daily.     clopidogreL (PLAVIX) 75 mg tab Take  by mouth.    traZODone (DESYREL) 50 mg tablet Take  by mouth nightly.    torsemide (DEMADEX) 100 mg tablet Take 100 mg by mouth daily. Pt takes occasionally    amLODIPine (NORVASC) 10 mg tablet Take  by mouth daily.    furosemide (LASIX) 20 mg tablet Take  by mouth daily. (Patient not taking: Reported on 05/11/2021)     No current facility-administered medications for this visit.       [facility] follow up appointment(s):   Future Appointments   Date Time Provider [place]   09/16/2021  9:30 AM Huston Foley, MD [facility] AMB   11/09/2021  2:45 PM Leland Her, MD [facility] AMB

## 2021-08-26 NOTE — Progress Notes (Signed)
 Complex Case Management      Date/Time:  08/26/2021 10:09 AM    Method of communication with patient:phone    LPN Care Coordinator(LPN CC) contacted the patient by telephone to perform Ambulatory Care Coordination. Verified name and DOB (PHI) with patient as identifiers. Provided introduction to self, and explanation of the LPN CC's role.     Reviewed most recent clinic visit w/ patient who verbalized understanding. Patient given an opportunity to ask questions.    Top Challenges reviewed with the Provider   Patient needs to start back on all his medications. States he does not have any medications at this time.  Patient needs refills for all medications.  Patient's wife has passed. Oneita is on 2021/09/05. Patient is grieving loss of wife.     The patient agrees to contact the PCP office or the LPN CC for questions related to their healthcare. Provided contact information for future reference.    Disease Specific:   N/A    Home Health Active: No    DME Active: No    Barriers to care? stages of grief    Advance Care Planning:   Does patient have an Advance Directive:  not on file   Advance Care Planning  Healthcare Decision Maker:         Click here to complete HealthCare Decision Makers including selection of the Healthcare Decision Maker Relationship (ie Primary)            Medication(s):   Medication reconciliation was performed with patient, who verbalizes understanding of administration of home medications. There were no barriers to obtaining medications identified at this time.    Referral to Pharm D needed: no     Current Outpatient Medications   Medication Sig    aspirin delayed-release 81 mg tablet Take 1 Tablet by mouth daily.    metoprolol  succinate (TOPROL -XL) 25 mg XL tablet Take 1 Tablet by mouth daily.    isosorbide mononitrate ER (IMDUR) 30 mg tablet Take 1 Tablet by mouth every morning.    glucometer,pre-loaded strips (GLUCOSE METER, DISP & STRIPS) Dispense based on patient and insurance preference.  Use up to four times daily as directed. (FOR ICD-10 E10.9, E11.9).    needles, insulin disposable (INSULIN PEN NEEDLE) Use new needle with each basaglar injection. Dx E11.9, Z79.4    multivitamin capsule Take 1 Tablet by mouth daily.    propylene glycol (SYSTANE BALANCE OP) Apply 1 Drop to eye as needed.    blood-glucose meter (RELION ALL-IN-ONE METER)     insulin glargine (LANTUS,BASAGLAR) 100 unit/mL (3 mL) inpn 5 Units by SubCUTAneous route.    melatonin 3 mg tablet Take 3 mg by mouth. (Patient not taking: No sig reported)    ofloxacin (FLOXIN) 0.3 % otic solution 5 Drops by Otic route daily.    dulaglutide  (Trulicity ) 0.75 mg/0.5 mL sub-q pen 0.75 mg by SubCUTAneous route every seven (7) days.    gabapentin  (NEURONTIN ) 300 mg capsule Take 1 Capsule by mouth three (3) times daily. Max Daily Amount: 900 mg. Indications: neuropathic pain    atorvastatin  (LIPITOR) 20 mg tablet Take  by mouth daily.    clopidogreL  (PLAVIX ) 75 mg tab Take  by mouth.    traZODone  (DESYREL ) 50 mg tablet Take  by mouth nightly.    torsemide (DEMADEX) 100 mg tablet Take 100 mg by mouth daily. Pt takes occasionally    amLODIPine (NORVASC) 10 mg tablet Take  by mouth daily.    furosemide (LASIX) 20 mg tablet  Take  by mouth daily. (Patient not taking: Reported on 05/11/2021)     No current facility-administered medications for this visit.       [facility] follow up appointment(s):   Future Appointments   Date Time Provider Department Center   11/09/2021  2:45 PM Tobie Glynis GAILS, MD [facility] BS AMB        Non-[facility] follow up appointment(s): Dialysis on M-W-F     Goals Addressed                   This Visit's Progress     Patient verbalizes understanding of self -management goals of living with Diabetes.        Take medications as prescribed  Eat a healthy die monitor carb and sugar intake  Check blood sugars everyday       Self-schedules and keeps appointments         Takes Medications as prescribed        Patient will start taking medications as  prescribed by 09/26/21

## 2021-08-27 ENCOUNTER — Encounter

## 2021-09-16 ENCOUNTER — Encounter: Payer: MEDICARE | Attending: Geriatric Medicine | Primary: Geriatric Medicine

## 2021-09-23 ENCOUNTER — Encounter: Attending: Geriatric Medicine | Primary: Geriatric Medicine

## 2021-09-30 ENCOUNTER — Ambulatory Visit: Attending: Geriatric Medicine | Primary: Geriatric Medicine

## 2021-11-09 ENCOUNTER — Ambulatory Visit: Attending: Cardiovascular Disease | Primary: Geriatric Medicine

## 2021-11-09 ENCOUNTER — Encounter: Attending: Cardiovascular Disease | Primary: Geriatric Medicine

## 2021-11-11 ENCOUNTER — Telehealth: Payer: Self-pay | Admitting: Sports Medicine

## 2021-11-11 NOTE — Telephone Encounter (Signed)
Pt called just got your letter that you are leaving and was asking if we could tell him where you were going as he would like to follow you He is now living in [place] but is planning on come back to the area soon  to visit and will call me next week to try to set up an appt to see you before you leave. He also said his wife passed away recently from heart failure and I did apologize for his loss. ?

## 2021-11-15 NOTE — Telephone Encounter (Signed)
Called pt and voicemail is full

## 2021-11-16 ENCOUNTER — Encounter

## 2021-11-16 ENCOUNTER — Ambulatory Visit
Admit: 2021-11-16 | Discharge: 2021-11-16 | Payer: MEDICARE | Attending: Geriatric Medicine | Primary: Geriatric Medicine

## 2021-11-16 ENCOUNTER — Inpatient Hospital Stay: Admit: 2021-11-16 | Payer: MEDICARE | Primary: Geriatric Medicine

## 2021-11-16 DIAGNOSIS — I10 Essential (primary) hypertension: Secondary | ICD-10-CM

## 2021-11-16 DIAGNOSIS — E0800 Diabetes mellitus due to underlying condition with hyperosmolarity without nonketotic hyperglycemic-hyperosmolar coma (NKHHC): Secondary | ICD-10-CM

## 2021-11-16 LAB — LIPID PANEL
Chol/HDL Ratio: 2.2 (ref 0–5.0)
Cholesterol, Total: 158 MG/DL (ref <200)
HDL: 72 MG/DL — ABNORMAL HIGH (ref 40–60)
LDL Calculated: 70.2 MG/DL (ref 0–100)
Triglycerides: 79 MG/DL (ref <150)
VLDL Cholesterol Calculated: 15.8 MG/DL

## 2021-11-16 LAB — COMPREHENSIVE METABOLIC PANEL
ALT: 36 U/L (ref 16–61)
AST: 19 U/L (ref 10–38)
Albumin/Globulin Ratio: 0.8 (ref 0.8–1.7)
Albumin: 3.3 g/dL — ABNORMAL LOW (ref 3.4–5.0)
Alk Phosphatase: 143 U/L — ABNORMAL HIGH (ref 45–117)
Anion Gap: 6 mmol/L (ref 3.0–18)
BUN: 47 MG/DL — ABNORMAL HIGH (ref 7.0–18)
Bun/Cre Ratio: 8 — ABNORMAL LOW (ref 12–20)
CO2: 31 mmol/L (ref 21–32)
Calcium: 8.5 MG/DL (ref 8.5–10.1)
Chloride: 107 mmol/L (ref 100–111)
Creatinine: 5.63 MG/DL — ABNORMAL HIGH (ref 0.6–1.3)
Est, Glom Filt Rate: 10 mL/min/{1.73_m2} — ABNORMAL LOW (ref >60)
Globulin: 3.9 g/dL (ref 2.0–4.0)
Glucose: 68 mg/dL — ABNORMAL LOW (ref 74–99)
Potassium: 4.3 mmol/L (ref 3.5–5.5)
Sodium: 144 mmol/L (ref 136–145)
Total Bilirubin: 0.5 MG/DL (ref 0.2–1.0)
Total Protein: 7.2 g/dL (ref 6.4–8.2)

## 2021-11-16 LAB — CBC
Hematocrit: 41.7 % (ref 36.0–48.0)
Hemoglobin: 12.6 g/dL — ABNORMAL LOW (ref 13.0–16.0)
MCH: 22 PG — ABNORMAL LOW (ref 24.0–34.0)
MCHC: 30.2 g/dL — ABNORMAL LOW (ref 31.0–37.0)
MCV: 72.8 FL — ABNORMAL LOW (ref 78.0–100.0)
Nucleated RBCs: 0 PER 100 WBC
Platelets: 173 10*3/uL (ref 135–420)
RBC: 5.73 M/uL — ABNORMAL HIGH (ref 4.35–5.65)
RDW: 15.5 % — ABNORMAL HIGH (ref 11.6–14.5)
WBC: 6.1 10*3/uL (ref 4.6–13.2)
nRBC: 0 10*3/uL (ref 0.00–0.01)

## 2021-11-16 MED ORDER — TRAZODONE HCL 50 MG PO TABS
50 MG | ORAL_TABLET | Freq: Every evening | ORAL | 3 refills | Status: DC
Start: 2021-11-16 — End: 2022-07-26

## 2021-11-16 MED ORDER — GABAPENTIN 300 MG PO CAPS
300 MG | ORAL_CAPSULE | Freq: Three times a day (TID) | ORAL | 0 refills | Status: AC
Start: 2021-11-16 — End: 2022-02-14

## 2021-11-16 NOTE — Progress Notes (Signed)
ROOM # 1  Identified pt with two pt identifiers(name and DOB). Reviewed record in preparation for visit and have obtained necessary documentation.  Chief Complaint   Patient presents with    Diabetes    Medication Refill      Hunter Higgins preferred language for health care discussion is english/other.  Is the patient using any DME equipment during OV? yes    Hunter Higgins is due for:  Health Maintenance Due   Topic    Diabetic foot exam     Lipids     Diabetic retinal exam     Hepatitis C screen     Shingles vaccine (1 of 2)    Hepatitis B vaccine (1 of 3 - Risk Dialysis 4-dose series)    Colorectal Cancer Screen     DTaP/Tdap/Td vaccine (2 - Td or Tdap)    AAA screen     A1C test (Diabetic or Prediabetic)        Health Maintenance reviewed and discussed per provider  Please order/place referral if appropriate.    1. For patients aged 21-75: Has the patient had a colonoscopy? no  If the patient is male:    2. For patients aged 38-74: Has the patient had a mammogram within the past 2 years? N/A    3. For patients aged 21-65: Has the patient had a pap smear? N/A    Advance Directive:  1. Do you have an advance directive in place? Patient Reply:no    2. If not, would you like material regarding how to put one in place? Patient Reply:no    Coordination of Care:  1. Have you been to the ER, urgent care clinic since your last visit?  Hospitalized since your last visit? Patient Reply:no    2. Have you seen or consulted any other health care providers outside of the [facility] since your last visit? Include any pap smears or colon. Patient Reply:no    Patient is accompanied by  I have received verbal consent from Hunter Higgins to discuss any/all medical information while they are present in the room.    Depression Screening:  PHQ-9  11/16/2021   Little interest or pleasure in doing things 1   Little interest or pleasure in doing things -   Feeling down, depressed, or hopeless 1   PHQ-2 Score 2   Total  Score PHQ 2 -   PHQ-9 Total Score 2           Recent Travel Screening and Travel History documentation     Travel Screening     No screening recorded since 11/15/21 0000       Travel History   Travel since 10/16/21    No documented travel since 10/16/21

## 2021-11-16 NOTE — Progress Notes (Signed)
Progress Note    Patient: Hunter Higgins               Sex: male                  Date of Birth:  [birth year]      Age:  71 y.o.                    HPI:     Hunter Higgins is a 71 y.o. male who has been seen for followup of DM and hypertension . He is on dialysis for ESRD     Past Medical History:   Diagnosis Date   . CAD (coronary artery disease)    . Diabetes ([place])    . Diabetic foot ulcer ([place])     Left   . Dialysis patient ([place])    . ESRD (end stage renal disease) ([place])    . Foot ulcer ([place])    . Hypertension    . Retinopathy    . Visual impairment        Past Surgical History:   Procedure Laterality Date   . DEBRIDEMENT OPEN WOUND 20 SQ CM< Left     foot   . PROSTATE SURGERY         No family history on file.      Current Outpatient Medications:   .  amLODIPine (NORVASC) 10 MG tablet, Take by mouth daily, Disp: , Rfl:   .  aspirin 81 MG EC tablet, Take 81 mg by mouth daily, Disp: , Rfl:   .  atorvastatin (LIPITOR) 20 MG tablet, Take by mouth daily, Disp: , Rfl:   .  clopidogrel (PLAVIX) 75 MG tablet, Take by mouth, Disp: , Rfl:   .  Dulaglutide (TRULICITY) 1.61 WR/6.0AV SOPN, Inject 0.75 mg into the skin every 7 days, Disp: , Rfl:   .  furosemide (LASIX) 20 MG tablet, Take by mouth daily, Disp: , Rfl:   .  gabapentin (NEURONTIN) 300 MG capsule, Take 300 mg by mouth 3 times daily., Disp: , Rfl:   .  insulin glargine (LANTUS SOLOSTAR) 100 UNIT/ML injection pen, Inject 5 Units into the skin, Disp: , Rfl:   .  isosorbide mononitrate (IMDUR) 30 MG extended release tablet, Take 30 mg by mouth, Disp: , Rfl:   .  melatonin 3 MG TABS tablet, Take 3 mg by mouth, Disp: , Rfl:   .  metoprolol succinate (TOPROL XL) 25 MG extended release tablet, Take 25 mg by mouth daily, Disp: , Rfl:   .  ofloxacin (FLOXIN) 0.3 % otic solution, Place 5 drops in ear(s) daily, Disp: , Rfl:   .  torsemide (DEMADEX) 100 MG tablet, Take 100 mg by mouth daily, Disp: , Rfl:   .  traZODone (DESYREL) 50 MG tablet, Take by mouth, Disp: , Rfl:       Allergies   Allergen Reactions   . Ace Inhibitors Cough, Itching and Other (See Comments)     cough  Other reaction(s): Cough  cough  cough   . Naproxen Anaphylaxis, Shortness Of Breath and Swelling     Other reaction(s): gi distress, Other, Other (See Comments)  Throat swells. cannot breathe, and causes GI distress  REACTION: Throat swells and cannot breathe  anaphylaxis  REACTION: Throat swells and cannot breathe  anaphylaxis  Throat swells. cannot breathe, and causes GI distress       Review of Systems       Physical Exam:  There were no vitals taken for this visit.    Physical Exam  Constitutional:       Appearance: Normal appearance.   HENT:      Right Ear: There is impacted cerumen.      Left Ear: There is impacted cerumen.   Cardiovascular:      Rate and Rhythm: Regular rhythm. Tachycardia present.      Heart sounds: Normal heart sounds. No murmur heard.  Pulmonary:      Effort: Pulmonary effort is normal. No respiratory distress.      Breath sounds: Normal breath sounds. No stridor. No wheezing or rales.   Skin:     General: Skin is warm and dry.   Neurological:      General: No focal deficit present.      Mental Status: He is alert and oriented to person, place, and time.   Psychiatric:         Mood and Affect: Mood normal.         Behavior: Behavior normal.        Labs Reviewed:      Assessment/Plan       ICD-10-CM    1. Diabetes mellitus due to underlying condition with hyperosmolarity without nonketotic hyperglycemic-hyperosmolar coma [facility]) (HCC)  E08.00 AMB POC HEMOGLOBIN A1C      2. Essential (primary) hypertension  I10 Comprehensive Metabolic Panel      3. Chronic kidney disease, stage 4 (severe) (HCC)  N18.4 Comprehensive Metabolic Panel      4. Anemia due to stage 4 chronic kidney disease (HCC)  N18.4 CANCELED: CBC    D63.1       5. Adjustment insomnia  F51.02 traZODone (DESYREL) 50 MG tablet      6. Other chronic pain  G89.29 gabapentin (NEURONTIN) 300 MG capsule      7. Bilateral  impacted cerumen  [id number] External Referral To ENT      Advised he check with his nephrologist at his dialysis  re .best way to manage his HBP . He has been advised to not take his BP meds by his dialysis  nurse .    Briant Cedar, MD

## 2021-11-25 ENCOUNTER — Encounter: Payer: MEDICARE | Primary: Geriatric Medicine

## 2021-11-30 ENCOUNTER — Inpatient Hospital Stay: Admit: 2021-11-30 | Payer: MEDICARE | Primary: Geriatric Medicine

## 2021-11-30 DIAGNOSIS — R531 Weakness: Secondary | ICD-10-CM

## 2021-11-30 NOTE — Progress Notes (Signed)
PT DAILY TREATMENT NOTE/NEURO EVAL 11-20    Patient Name: Hunter Higgins    Date: 11/30/2021    DOB: 1950/09/25  Insurance: Payor: UHC MEDICARE / Plan: UHC AARP MEDICARE ADVANTAGE / Product Type: *No Product type* /      Patient DOB verified yes     Visit #   Current / Total 1 10   Time   In / Out 1120 1208   Pain   In / Out 6 6   Subjective Functional Status/Changes: See POC   Changes to:  Meds, Allergies, Med Hx, Sx Hx?  If yes, update Summary List  See POC     Treatment Area: Weakness [R53.1]    SUBJECTIVE    Pain Level (0-10 scale):   Current: 6/10 Best: 3/10 Worst: 7/10  Alleviated By: medication  Aggravated By: some movements    PLOF: walking  Limitations to PLOF: denies  Mechanism of Injury:  Dialysis   Current symptoms/Complaints: "I feel physically weak since my dialysis, I lost 35 pounds. Started 8 months ago." Foot ulcer on L foot, is wearing foot on L foot, seeing podiatrist d/t calluses. Began walking with walker about 8 months ago. Has had recent falls last Saturday, "legs gave out", did not have walker, did not go to hospital. Has had pain in back since then.   Previous Treatment/Compliance: Has had PT before for walking.  PMHx/Surgical Hx: see chart  Diagnostic Tests: see chart  Work Hx: retired  Living Situation: living alone, is currently I with ADLs, apt is handicap equipped   Goals: walk without walker, run         40 min [x] Eval     - untimed          Therapeutic Procedures:  Tx Min Billable or 1:1 Min (if diff from Tx Min) Procedure, Rationale, Specifics   8  97530 Therapeutic Activity (timed):  use of dynamic activities replicating functional movements to increase ROM, strength, coordination, balance, and proprioception in order to improve patient's ability to progress to PLOF and address remaining functional goals.  (see flow sheet as applicable)     Details if applicable:           Details if applicable:     Total Total Reminder: [facility] bill using total billable min of TIMED therapeutic  procedures (example: do not include dry needle or estim unattended, both untimed codes, in totals to left)     [x]   Patient Education billed concurrently with other procedures     OBJECTIVE    Observation: pt wearing L ankle CAM boot arrives with Charcot boot in bag however unable to wear d/t not having even-up for L shoe  Gait:   -with FWW: poor AD management, AD 1-2 segments too tall, decr weight shift onto R LE and lack of rocker on R LE  -without FWW: UE in high guard, decr step length, near no weight shift onto R LE, antalgic although pt reports no pain with amb    Strength (MMT):  UE: WNL  LE:    L (1-5) R (1-5)   HIP Flexion 5 5    Ext 4- 4    ABD 4- 4    ADD      ER 5 5    IR 5 5   KNEE Flexion 5 5    Ext 5 5   ANKLE PF NT d/t boot NT    DF NT d/t boot  4  Tone: (-)   Motor Control: WNL  Sensation: intact to light touch at B UE/LE, pt denies neuropathy     Balance/ Equilibrium:       Sitting Balance:  Dynamic: absent weight shift with reaching, hesitant       Standing Balance:    Romberg on floor, EO: 30sec  Bunion on floor, EO: 30sec B  Romberg on floor, EC: 30sec           Single Leg Stance on floor, EO:  L: unable, R: 1-2 sec         Functional Mobility  Bed Mobility: WNL, Independent  30secSTS: 14sec with LE A at edge of plinth, no UE A  SLR: good   Bridge: 100%, good   Stairs: NT  TUG: with FWW: 25 sec. Without FWW: 24 sec     Behavior: [x]  Cooperative    Other:       Impaired Judgement: []  Y    [x]  N      Impaired Vision:  [x]  Y    []  N      Safety Awareness Deficits  [x]  Y    []  N      Impaired Hearing:  [x]  Y    []  N      Able to Express Needs: [x]  Y    []  N    Other test /comments:   FOTO: 48/100      ASSESSMENT/Changes in Function: See POC    Patient will continue to benefit from skilled PT services to modify and progress therapeutic interventions, analyze and address functional mobility deficits, analyze and address ROM deficits, analyze and address strength deficits, analyze and address  soft tissue restrictions, analyze and cue for proper movement patterns, analyze and modify for postural abnormalities, analyze and address imbalance/dizziness, and instruct in home and community integration to address functional deficits and attain remaining goals.      PLAN  [x]   Upgrade activities as tolerated     []   Continue plan of care  []   Update interventions per flow sheet       []   Other:_   2x/week for 4-8 weeks    Rickard Patience, PT 11/30/2021  11:21 AM      Justification for Eval Code Complexity:  Patient History: see above  Examination: see exam   Clinical Presentation: evolving  Clinical Decision Making : FOTO : 48 /100

## 2021-11-30 NOTE — Progress Notes (Addendum)
[facility]  [address] [place] [address] Phone: [phone] Fax [phone]  Plan of Care / Statement of Necessity for Physical Therapy Services     Patient Name: Hunter Higgins DOB: [birth year]   Medical   Diagnosis: Weakness [R53.1] Treatment Diagnosis: M62.81  GENERAL MUSCLE WEAKNESS     Onset Date: 8 months ago Payor: Payor: UHC MEDICARE / Plan: [facility] AARP MEDICARE ADVANTAGE / Product Type: *No Product type* /    Referral Source: Huston Foley, MD Start of Care [facility]): 11/30/2021   Prior Hospitalization: See medical history Provider #: [phone]   Prior Level of Function: Independent with ADLs, lives alone in handicap accessible apartment   Comorbidities: Renal Disease, CAD, DM, HTN, Visual/Hearing impaired    Medications: Verified on Patient Summary List     Assessment / key information:    Pt is a 71 yo male presenting with c/o generalized muscle weakness secondary to beginning dialysis 8 months ago and overall reduction in activity. Pt endorses recent fall while amb in community without FWW causing incr in back pain. Pt presents with good B LE strength, poor gait patterning with and without FWW, fair standing/sitting balance, and intact sensation. Back pain was not assessed at this time however pain was not exacerbated during testing at [facility]. Pt is wearing CAM boot on L foot at this time d/t having not transitioned to Charcot boot and lacking even-up for opposite foot. Functional deficits at this time include prolonged walking. Pt would benefit from skilled PT to address these deficits to assist pt to full return to PLOF activity. HEP initiated with good pt tolerance noted.    Evaluation Complexity:  History:  HIGH Complexity :3+ comorbidities / personal factors will impact the outcome/ POC ; Examination:  HIGH Complexity : 4+ Standardized tests and measures addressing body structure, function, activity limitation and / or participation in recreation   ;Presentation:  MEDIUM Complexity : Evolving with changing characteristics  ;Clinical Decision Making:  MEDIUM Complexity : FOTO score of 26-74 FOTO score = an established functional score where 100 = no disability  Overall Complexity Rating: MEDIUM  Problem List: pain affecting function, decrease strength, impaired gait/balance, decrease ADL/functional abilities, and decrease activity tolerance   Treatment Plan may include any combination of the following: 97110 Therapeutic Exercise, 97112 Neuromuscular Re-Education, 97140 Manual Therapy, 97530 Therapeutic Activity, 97535 Self Care/Home Management, [phone] Aquatic Therapy, and 97116 Gait Training If patient is receiving VASO: Vasopnuematic compression justification:  Per bilateral girth measures taken and listed above the edema is considered significant and having an impact on the patient's strength, balance, gait, transfers, self care, and ADL's  Patient / Family readiness to learn indicated by: asking questions, trying to perform skills, and interest  Persons(s) to be included in education: patient (P)  Barriers to Learning/Limitations: none  Patient Goal (s): "walk without my walker, more movement and flexibility without pain"  Patient Self Reported Health Status: good  Rehabilitation Potential: good    Short Term Goals: To be accomplished in  4  weeks:   1. Pt will be compliant with initial HEP to improve ability to independently manage symptoms.  Status at assessment: reviewed and initiated HEP  2.  Pt to demo TUG with FWW of no greater than 15 sec with normalized gait pattern for improved safety and speed with amb  Status at assessment: 25 sec, impaired gait pattern  3.  Pt to demo SLS of  at least 10 sec on level surface for improved ankle/hip stability and decr falls risk   Status at assessment: L: unable, R: 1-2 sec   Long Term Goals: To be accomplished in  8-12  weeks:  1. Pt to report FOTO score of at least 53/100 pts to demonstrate improved function and  quality of life.  Status at assessment: 48/100  2.  Pt to demo B hip ext MMT strength of at least 4+/5 for improved hip stability as needed for transfers and stairs  Status at assessment: L: 4-/5, R: 4/5  3.  Pt to be I with final HEP for self management of sx after PT.    Frequency / Duration: Patient would benefit from skilled PT 2 times per week for up to 36 sessions as needed in this certification period.  Goals will be assigned and reassessed every 10 visits/ 30 days per guidelines .  Patient/ Caregiver education and instruction: Diagnosis, prognosis, self care, activity modification, and exercises [x]   Plan of care has been reviewed with PTA    Certification Period: 11/30/21 - 02/26/22     Rickard Patience, PT       11/30/2021       8:04 AM  ===================================================================  I certify that the above Therapy Services are being furnished while the patient is under my care. I agree with the treatment plan and certify that this therapy is necessary.    Physician's Signature:_________________________   DATE:_________   TIME:________                           Huston Foley, MD    ** Signature, Date and Time must be completed for valid certification **  Please sign and return to [facility] or you may fax the signed copy to [phone].  Thank you.

## 2021-12-02 ENCOUNTER — Inpatient Hospital Stay: Admit: 2021-12-02 | Payer: MEDICARE | Primary: Geriatric Medicine

## 2021-12-02 NOTE — Progress Notes (Signed)
PHYSICAL / OCCUPATIONAL THERAPY - DAILY TREATMENT NOTE (updated 1/23)    Patient Name: Hunter Higgins    Date: 12/02/2021    DOB: Feb 06, 1951  Insurance: Payor: UHC MEDICARE / Plan: UHC AARP MEDICARE ADVANTAGE / Product Type: *No Product type* /      Patient DOB verified Yes     Visit #   Current / Total 2 10   Time   In / Out 10:40 am 11:04 pm   Pain   In / Out 0/10 0/10   Subjective Functional Status/Changes: "I am weak."   Changes to:  Meds, Allergies, Med Hx, Sx Hx?  If yes, update Summary List no       TREATMENT AREA =  Weakness [R53.1]    OBJECTIVE         Therapeutic Procedures:  Tx Min Billable or 1:1 Min (if diff from Tx Min) Procedure, Rationale, Specifics   14  [id number] Neuromuscular Re-Education (timed):  improve balance, coordination, kinesthetic sense, posture, core stability and proprioception to improve patient's ability to develop conscious control of individual muscles and awareness of position of extremities in order to progress to PLOF and address remaining functional goals. (see flow sheet as applicable)     Details if applicable:  limits of stability     10  [id number] Therapeutic Activity (timed):  use of dynamic activities replicating functional movements to increase ROM, strength, coordination, balance, and proprioception in order to improve patient's ability to progress to PLOF and address remaining functional goals.  (see flow sheet as applicable)     Details if applicable:  squats, step ups          Details if applicable:            Details if applicable:            Details if applicable:     24  [facility] BC Totals Reminder: bill using total billable min of TIMED therapeutic procedures (example: do not include dry needle or estim unattended, both untimed codes, in totals to left)  8-22 min = 1 unit; 23-37 min = 2 units; 38-52 min = 3 units; 53-67 min = 4 units; 68-82 min = 5 units   Total Total     [x]   Patient Education billed concurrently with other procedures   [x]  Review HEP    []  Progressed/Changed  HEP, detail:    []  Other detail:       Objective Information/Functional Measures/Assessment  Good tolerance to treatment today with patient req 100% verbal/tactile cueing asnd demo for proper form/technique with all newly introduced therex. Patient requires frequent standing breaks secondary to fatigue.    Patient will continue to benefit from skilled PT / OT services to modify and progress therapeutic interventions, analyze and address functional mobility deficits, analyze and address ROM deficits, analyze and address strength deficits, analyze and address soft tissue restrictions, analyze and cue for proper movement patterns, analyze and modify for postural abnormalities, and analyze and address imbalance/dizziness to address functional deficits and attain remaining goals.    Progress toward goals / Updated goals:  []   See Progress Note/Recertification  Short Term Goals: To be accomplished in  4  weeks:   1. Pt will be compliant with initial HEP to improve ability to independently manage symptoms.  Status at assessment: reviewed and initiated HEP  Current: goal progressing; pt informed to perform prone HEP standing (hip extension) to avoid compression to dialysis equipment (12/02/21)  2.  Pt to demo TUG  with FWW of no greater than 15 sec with normalized gait pattern for improved safety and speed with amb  Status at assessment: 25 sec, impaired gait pattern  3.  Pt to demo SLS of at least 10 sec on level surface for improved ankle/hip stability and decr falls risk   Status at assessment: L: unable, R: 1-2 sec   Long Term Goals: To be accomplished in  8-12  weeks:  1. Pt to report FOTO score of at least 53/100 pts to demonstrate improved function and quality of life.  Status at assessment: 48/100  2.  Pt to demo B hip ext MMT strength of at least 4+/5 for improved hip stability as needed for transfers and stairs  Status at assessment: L: 4-/5, R: 4/5  3.  Pt to be I with final HEP for self management of sx after  PT.       PLAN  Yes  Continue plan of care  []   Upgrade activities as tolerated  []   Discharge due to :  [x]   Other: assess response to treatment    Roanna Raider, PTA    12/02/2021      Future Appointments   Date Time Provider [place]   12/07/2021 10:20 AM Leisa Lenz, PT [facility]   12/09/2021  9:40 AM [place] PT [facility] 2 [facility] [place]   12/14/2021 11:40 AM [place] PT GHENT 3 [facility] [place]   12/16/2021 12:20 PM Roanna Raider, PTA [facility]   12/21/2021 10:20 AM Roanna Raider, PTA [facility]   12/23/2021 10:20 AM Leisa Lenz, PT [facility]   12/28/2021  8:00 AM Saumil Sherrye Payor, MD [place] [facility]   12/28/2021 11:40 AM Rickard Patience, PT [facility]   12/30/2021 10:20 AM Rickard Patience, PT [facility]   02/17/2022 11:30 AM Huston Foley, MD [facility]

## 2021-12-07 ENCOUNTER — Other Ambulatory Visit: Payer: Self-pay | Admitting: *Deleted

## 2021-12-07 ENCOUNTER — Ambulatory Visit: Payer: Medicare Other | Admitting: Sports Medicine

## 2021-12-07 ENCOUNTER — Ambulatory Visit
Admit: 2021-12-07 | Discharge: 2021-12-07 | Payer: MEDICARE | Attending: Geriatric Medicine | Primary: Geriatric Medicine

## 2021-12-07 ENCOUNTER — Inpatient Hospital Stay: Payer: MEDICARE | Primary: Geriatric Medicine

## 2021-12-07 ENCOUNTER — Inpatient Hospital Stay: Admit: 2021-12-07 | Payer: MEDICARE | Primary: Geriatric Medicine

## 2021-12-07 DIAGNOSIS — E0800 Diabetes mellitus due to underlying condition with hyperosmolarity without nonketotic hyperglycemic-hyperosmolar coma (NKHHC): Secondary | ICD-10-CM

## 2021-12-07 DIAGNOSIS — E1100 Type 2 diabetes mellitus with hyperosmolarity without nonketotic hyperglycemic-hyperosmolar coma (NKHHC): Secondary | ICD-10-CM

## 2021-12-07 DIAGNOSIS — H6123 Impacted cerumen, bilateral: Secondary | ICD-10-CM

## 2021-12-07 LAB — HEMOGLOBIN A1C
Hemoglobin A1C: 7.8 % — ABNORMAL HIGH (ref 4.2–5.6)
eAG: 177 mg/dL

## 2021-12-07 MED ORDER — TRULICITY 0.75 MG/0.5ML SC SOPN
0.750.5 MG/0.5ML | SUBCUTANEOUS | 3 refills | Status: AC
Start: 2021-12-07 — End: 2021-12-09

## 2021-12-07 MED ORDER — ATORVASTATIN CALCIUM 20 MG PO TABS
20 MG | ORAL_TABLET | Freq: Every day | ORAL | 2 refills | Status: AC
Start: 2021-12-07 — End: 2022-02-17

## 2021-12-07 MED ORDER — CLOPIDOGREL BISULFATE 75 MG PO TABS
75 MG | ORAL_TABLET | Freq: Every day | ORAL | 2 refills | Status: DC
Start: 2021-12-07 — End: 2022-02-17

## 2021-12-07 MED ORDER — METOPROLOL SUCCINATE ER 25 MG PO TB24
25 MG | ORAL_TABLET | Freq: Every day | ORAL | 2 refills | Status: DC
Start: 2021-12-07 — End: 2022-02-17

## 2021-12-07 NOTE — Telephone Encounter (Signed)
-----   Message from Reino Bellis sent at 12/03/2021  2:14 PM EDT -----  Subject: Referral Request    Reason for referral request? ENT   Provider patient wants to be referred to(if known):     Provider Phone Number(if known):    Additional Information for Provider? Patient wants the PCP to get him a   referral to a ENT asap he said he has lost his hearing in his left ear and   he does not want to lose it in his right ear  ---------------------------------------------------------------------------  --------------  [place]    [phone]; OK to leave message on voicemail,Do not leave any message,   patient will call back for answer  ---------------------------------------------------------------------------  --------------

## 2021-12-07 NOTE — Progress Notes (Signed)
Progress Note    Patient: Hunter Higgins               Sex: male                  Date of Birth:  [birth year]      Age:  71 y.o.                    HPI:     Hunter Higgins is a 71 y.o. male who has been seen for  evaluation for hearing loss . He is also followed for DM , ESRD, hypertension and hyperlipidemia . His BP and lipids are well controlled . He has had a lot of popping in his ears . He has ringing in his ears . He is deaf in his left ear he claims after being subject to very noisy music in church . He woke up next morning unable to hear out of his L ear .    Past Medical History:   Diagnosis Date    CAD (coronary artery disease)     Diabetes ([place])     Diabetic foot ulcer ([place])     Left    Dialysis patient ([place])     ESRD (end stage renal disease) ([place])     Foot ulcer ([place])     Hypertension     Retinopathy     Visual impairment        Past Surgical History:   Procedure Laterality Date    DEBRIDEMENT OPEN WOUND 20 SQ CM< Left     foot    PROSTATE SURGERY         No family history on file.      Current Outpatient Medications:     gabapentin (NEURONTIN) 300 MG capsule, Take 1 capsule by mouth 3 times daily for 90 days. Max Daily Amount: 900 mg, Disp: 270 capsule, Rfl: 0    traZODone (DESYREL) 50 MG tablet, Take 1 tablet by mouth nightly, Disp: 30 tablet, Rfl: 3    aspirin 81 MG EC tablet, Take 1 tablet by mouth daily, Disp: , Rfl:     atorvastatin (LIPITOR) 20 MG tablet, Take by mouth daily, Disp: , Rfl:     clopidogrel (PLAVIX) 75 MG tablet, Take by mouth, Disp: , Rfl:     Dulaglutide (TRULICITY) 8.18 EX/9.3ZJ SOPN, Inject 0.75 mg into the skin every 7 days, Disp: , Rfl:     isosorbide mononitrate (IMDUR) 30 MG extended release tablet, Take 1 tablet by mouth, Disp: , Rfl:     melatonin 3 MG TABS tablet, Take 1 tablet by mouth, Disp: , Rfl:     metoprolol succinate (TOPROL XL) 25 MG extended release tablet, Take 1 tablet by mouth daily, Disp: , Rfl:     torsemide (DEMADEX) 100 MG tablet, Take 1 tablet by mouth  daily, Disp: , Rfl:     amLODIPine (NORVASC) 10 MG tablet, Take by mouth daily (Patient not taking: Reported on 11/16/2021), Disp: , Rfl:     furosemide (LASIX) 20 MG tablet, Take by mouth daily (Patient not taking: Reported on 11/16/2021), Disp: , Rfl:     insulin glargine (LANTUS SOLOSTAR) 100 UNIT/ML injection pen, Inject 5 Units into the skin (Patient not taking: Reported on 11/16/2021), Disp: , Rfl:     ofloxacin (FLOXIN) 0.3 % otic solution, Place 5 drops in ear(s) daily (Patient not taking: Reported on 11/16/2021), Disp: , Rfl:      Allergies   Allergen  Reactions    Ace Inhibitors Cough, Itching and Other (See Comments)     cough  Other reaction(s): Cough  cough  cough    Naproxen Anaphylaxis, Shortness Of Breath and Swelling     Other reaction(s): gi distress, Other, Other (See Comments)  Throat swells. cannot breathe, and causes GI distress  REACTION: Throat swells and cannot breathe  anaphylaxis  REACTION: Throat swells and cannot breathe  anaphylaxis  Throat swells. cannot breathe, and causes GI distress       Review of Systems       Physical Exam:      Visit Vitals  Temp 97.5 F (36.4 C) (Temporal)   Resp 18   Ht 5\' 7"  (1.702 m)   Wt 157 lb (71.2 kg)   BMI 24.59 kg/m       Physical Exam  Constitutional:       Appearance: Normal appearance.   HENT:      Right Ear: There is impacted cerumen.      Left Ear: There is impacted cerumen.   Cardiovascular:      Rate and Rhythm: Normal rate and regular rhythm.      Pulses: Normal pulses.      Heart sounds: Normal heart sounds. No murmur heard.    No friction rub. No gallop.   Pulmonary:      Effort: Pulmonary effort is normal. No respiratory distress.      Breath sounds: Normal breath sounds. No stridor. No wheezing or rhonchi.   Neurological:      Mental Status: He is alert.        Labs Reviewed:      Assessment/Plan       ICD-10-CM    1. Bilateral impacted cerumen  H61.23       2. Diabetes mellitus due to underlying condition with hyperosmolarity without nonketotic  hyperglycemic-hyperosmolar coma [facility]) (HCC)  E08.00 Dulaglutide (TRULICITY) 8.93 YB/0.1BP SOPN     Hemoglobin A1C      3. Chronic kidney disease, stage 4 (severe) (HCC)  N18.4       4. Essential (primary) hypertension  I10 metoprolol succinate (TOPROL XL) 25 MG extended release tablet      5. Pure hypercholesterolemia  E78.00 atorvastatin (LIPITOR) 20 MG tablet      6. Peripheral vascular disease, unspecified (HCC)  I73.9 clopidogrel (PLAVIX) 75 MG tablet       Ears are now clear of cerumen . He advises he needs  a Rx filled for prefilled pens  ? Insulin .  He will call and let us know name . ? Trulicity ?     Briant Cedar, MD

## 2021-12-07 NOTE — Telephone Encounter (Signed)
Spoke with pt in regards to ENT referral request. Two patient identifier's verified.  Relayed the PCP's note, Could be a cerumen impaction . Can we see him per Dr. Valere Dross. Pt acknowledges understanding and states the last time he had an earwax problem, water was used only, he had difficulty with his ear not popped until two days later. Pt states he will attempt to have the ear cleaning done again. Pt is scheduled for 12/07/21. No further concerns were voiced at this time.

## 2021-12-07 NOTE — Progress Notes (Signed)
I performed a bilateral ear irrigation. Ear wax was removed from both ears. Patient tolerated the procedure well during and after.

## 2021-12-07 NOTE — Progress Notes (Signed)
ROOM # 1  Identified pt with two pt identifiers(name and DOB). Reviewed record in preparation for visit and have obtained necessary documentation.  Chief Complaint   Patient presents with    Cerumen Impaction      Hunter Higgins preferred language for health care discussion is english/other.  Is the patient using any DME equipment during OV? no    Hunter Higgins is due for:  Health Maintenance Due   Topic    Diabetic foot exam     Diabetic retinal exam     Hepatitis C screen     Shingles vaccine (1 of 2)    Hepatitis B vaccine (1 of 3 - Risk Dialysis 4-dose series)    Colorectal Cancer Screen     DTaP/Tdap/Td vaccine (2 - Td or Tdap)    AAA screen     A1C test (Diabetic or Prediabetic)        Health Maintenance reviewed and discussed per provider  Please order/place referral if appropriate.    1. For patients aged 13-75: Has the patient had a colonoscopy? no  If the patient is male:    2. For patients aged 53-74: Has the patient had a mammogram within the past 2 years? N/A    3. For patients aged 21-65: Has the patient had a pap smear? N/A    Advance Directive:  1. Do you have an advance directive in place? Patient Reply:no    2. If not, would you like material regarding how to put one in place? Patient Reply:no    Coordination of Care:  1. Have you been to the ER, urgent care clinic since your last visit?  Hospitalized since your last visit? Patient Reply:no    2. Have you seen or consulted any other health care providers outside of the [place] since your last visit? Include any pap smears or colon. Patient Reply:no    Patient is accompanied by  I have received verbal consent from Hunter Higgins to discuss any/all medical information while they are present in the room.    Depression Screening:  PHQ-9  11/16/2021   Little interest or pleasure in doing things 1   Little interest or pleasure in doing things -   Feeling down, depressed, or hopeless 1   PHQ-2 Score 2   Total Score PHQ 2 -   PHQ-9 Total  Score 2           Recent Travel Screening and Travel History documentation     Travel Screening     No screening recorded since 12/06/21 0000       Travel History   Travel since 11/06/21    No documented travel since 11/06/21

## 2021-12-09 ENCOUNTER — Inpatient Hospital Stay: Admit: 2021-12-09 | Payer: MEDICARE | Primary: Geriatric Medicine

## 2021-12-09 MED ORDER — TRULICITY 0.75 MG/0.5ML SC SOPN
0.750.5 MG/0.5ML | SUBCUTANEOUS | 3 refills | Status: DC
Start: 2021-12-09 — End: 2022-02-17

## 2021-12-09 NOTE — Addendum Note (Signed)
Addended by: Huston Foley on: 12/09/2021 02:46 PM     Modules accepted: Orders

## 2021-12-09 NOTE — Progress Notes (Addendum)
PHYSICAL / OCCUPATIONAL THERAPY - DAILY TREATMENT NOTE (updated 1/23)    Patient Name: Hunter Higgins    Date: 12/09/2021    DOB: [birth year]  Insurance: Payor: UHC MEDICARE / Plan: UHC AARP MEDICARE ADVANTAGE / Product Type: *No Product type* /      Patient DOB verified Yes     Visit #   Current / Total 3 10   Time   In / Out 9:05 9:53 (48)   Pain   In / Out 0 7   Subjective Functional Status/Changes: Pt late again for treatment. "I walked to and from my apt complex without my walker. People were very shocked. I'm willing myself to be independent. I can will myself not to fall"   Changes to:  Meds, Allergies, Med Hx, Sx Hx?  If yes, update Summary List no       TREATMENT AREA =  Weakness [R53.1]    OBJECTIVE      Therapeutic Procedures:  Tx Min Billable or 1:1 Min (if diff from Tx Min) Procedure, Rationale, Specifics   36 31 [id number] Neuromuscular Re-Education (timed):  improve balance, coordination, kinesthetic sense, posture, core stability and proprioception to improve patient's ability to develop conscious control of individual muscles and awareness of position of extremities in order to progress to PLOF and address remaining functional goals. (see flow sheet as applicable)     Details if applicable:  limits of stability with reaching performed in staggered stance; ball toss performed in step stance with L LE on BOSU ball  -      12 12 97530 Therapeutic Activity (timed):  use of dynamic activities replicating functional movements to increase ROM, strength, coordination, balance, and proprioception in order to improve patient's ability to progress to PLOF and address remaining functional goals.  (see flow sheet as applicable)     Details if applicable:  squats, step ups;   Gait in clinic with boot on the L and no AD; sbAx1 and           Details if applicable:            Details if applicable:            Details if applicable:     3 43 [facility] BC Totals Reminder: bill using total billable min of TIMED therapeutic  procedures (example: do not include dry needle or estim unattended, both untimed codes, in totals to left)  8-22 min = 1 unit; 23-37 min = 2 units; 38-52 min = 3 units; 53-67 min = 4 units; 68-82 min = 5 units   Total Total     [x]   Patient Education billed concurrently with other procedures   [x]  Review HEP    []  Progressed/Changed HEP, detail:  review safety with pt and advised not to perform outside independent gait for safety precautions, but to ambulate with increased distance with RW for safety.       []  Other detail:       Objective Information/Functional Measures/Assessment  Tandem stance approx 16", more challenged with L foot forward  Pt demo's instability with independent gait and does require sbAx1 however pt always able to self-correct for LOB during gait. He had several LOB during staggered stance or step stance that he was unable to self-correct for and required the parallel bars or PT assist to prevent fall.   Pt would benefit from being able to perform PT in (B) shoes. Advised him to ask his endocrinologist about the amt of time  he can spend out of the boot if any. However, pt notes that he is in charge of his body and he can do what he wants.       Patient will continue to benefit from skilled PT / OT services to modify and progress therapeutic interventions, analyze and address functional mobility deficits, analyze and address ROM deficits, analyze and address strength deficits, analyze and address soft tissue restrictions, analyze and cue for proper movement patterns, analyze and modify for postural abnormalities, and analyze and address imbalance/dizziness to address functional deficits and attain remaining goals.    Progress toward goals / Updated goals:  []   See Progress Note/Recertification  Short Term Goals: To be accomplished in  4  weeks:   1. Pt will be compliant with initial HEP to improve ability to independently manage symptoms.  Status at assessment: reviewed and initiated  HEP  Current: goal progressing; pt informed to perform prone HEP standing (hip extension) to avoid compression to dialysis equipment (12/02/21)  2.  Pt to demo TUG with FWW of no greater than 15 sec with normalized gait pattern for improved safety and speed with amb  Status at assessment: 25 sec, impaired gait pattern  3.  Pt to demo SLS of at least 10 sec on level surface for improved ankle/hip stability and decr falls risk   Status at assessment: L: unable, R: 1-2 sec   Current: progressing with Tandem stance up to 16" (12/09/21)  Long Term Goals: To be accomplished in  8-12  weeks:  1. Pt to report FOTO score of at least 53/100 pts to demonstrate improved function and quality of life.  Status at assessment: 48/100  2.  Pt to demo B hip ext MMT strength of at least 4+/5 for improved hip stability as needed for transfers and stairs  Status at assessment: L: 4-/5, R: 4/5  Current: added tband and did 3 way hip (12/09/21)  3.  Pt to be I with final HEP for self management of sx after PT.      PN due 1/91/47  [place] Recert due 04/13/55    PLAN  Yes  Continue plan of care  [x]   Upgrade activities as tolerated  []   Discharge due to :  []   Other    Sidney Ace, PT    12/09/2021      Future Appointments   Date Time Provider [place]   12/09/2021  9:00 AM Johnnye Lana, PT [facility]   12/14/2021 11:40 AM [facility] PT [facility] 3 [facility]   12/16/2021 12:20 PM Roanna Raider, PTA [facility]   12/16/2021  2:30 PM Huston Foley, MD [facility]   12/21/2021 10:20 AM Roanna Raider, PTA [facility]   12/23/2021 10:20 AM Leisa Lenz, PT [facility]   12/28/2021  8:00 AM Saumil Sherrye Payor, MD [facility] AMB   12/28/2021 11:40 AM Rickard Patience, PT [facility]   12/30/2021 10:20 AM Rickard Patience, PT [facility]   02/17/2022 11:30 AM Huston Foley, MD [facility]   04/12/2022  2:00 PM Huston Foley, MD [facility]

## 2021-12-09 NOTE — Progress Notes (Signed)
[facility]   [address] [place], [place] [address]  Phone: [phone] Fax [phone]  DISCHARGE SUMMARY  Patient Name: Hunter Higgins DOB: Nov 04, 1950   Treatment/Medical Diagnosis: Weakness [R53.1]   Referral Source: Huston Foley, MD     Date of Initial Visit: 11/30/21 Attended Visits: 3 Missed Visits: 3     SUMMARY OF TREATMENT  Pt is a 71 yo male presenting with c/o generalized muscle weakness secondary to beginning dialysis 8 months ago and overall reduction in activity. Pt endorses recent fall while amb in community without FWW causing incr in back pain. Pt was seen for 2 treatment and had 3 no-shows. Treatment consisted of warm-up, flexibility, strength, TherAct, NMREd for balance and safety, gait training, safety education, pt education, HEP progression     CURRENT STATUS  Pt was making good progress and motivated to con't with Gains in function and strength for improved ambulation and independence. Pt notes he was doing community level ambulation without walker. Pt con't to require frequent safety training throughout PT sessions as he is a fall risk esp in the community.     Objective/subjective from 12/09/21 session:  -Tandem stance approx 16", more challenged with L foot forward  -Pt demo's instability with independent gait and does require sbAx1 however pt always able to self-correct for LOB during gait. He had several LOB during staggered stance or step stance that he was unable to self-correct for and required the parallel bars or PT assist to prevent fall.   -Pt would benefit from being able to perform PT in (B) shoes. Advised him to ask his endocrinologist about the amt of time he can spend out of the boot if any. However, pt notes that he is in charge of his body and he can do what he wants.     GOALS FOR THIS PERIOD:  Short Term Goals: To be accomplished in  4  weeks:   1. Pt will be compliant with initial HEP to improve ability  to independently manage symptoms.  Status at assessment: reviewed and initiated HEP  Current: goal progressing; pt informed to perform prone HEP standing (hip extension) to avoid compression to dialysis equipment (12/02/21)  2.  Pt to demo TUG with FWW of no greater than 15 sec with normalized gait pattern for improved safety and speed with amb  Status at assessment: 25 sec, impaired gait pattern  3.  Pt to demo SLS of at least 10 sec on level surface for improved ankle/hip stability and decr falls risk   Status at assessment: L: unable, R: 1-2 sec   Current: progressing with Tandem stance up to 16" (12/09/21)  Long Term Goals: To be accomplished in  8-12  weeks:  1. Pt to report FOTO score of at least 53/100 pts to demonstrate improved function and quality of life.  Status at assessment: 48/100  2.  Pt to demo B hip ext MMT strength of at least 4+/5 for improved hip stability as needed for transfers and stairs  Status at assessment: L: 4-/5, R: 4/5  Current: added tband and did 3 way hip (12/09/21)  3.  Pt to be I with final HEP for self management of sx after PT.    RECOMMENDATIONS  Recommended con't with PT, however pt did not return after 12/09/21 and will be DC at this time. Thank you.     If you have any questions/comments please contact us  directly at [phone].   Thank you for allowing [place] to assist in the care of your patient.  PTA signature  Na      Therapist Signature: Sidney Ace, PT Date: 01/11/22   Reporting Period: 11/30/21 to 12/09/21 Time: 12:26 PM

## 2021-12-10 ENCOUNTER — Encounter: Payer: MEDICARE | Primary: Geriatric Medicine

## 2021-12-14 ENCOUNTER — Encounter: Payer: MEDICARE | Primary: Geriatric Medicine

## 2021-12-16 ENCOUNTER — Inpatient Hospital Stay: Payer: MEDICARE | Primary: Geriatric Medicine

## 2021-12-16 ENCOUNTER — Ambulatory Visit
Admit: 2021-12-16 | Discharge: 2021-12-16 | Payer: MEDICARE | Attending: Geriatric Medicine | Primary: Geriatric Medicine

## 2021-12-16 DIAGNOSIS — R221 Localized swelling, mass and lump, neck: Secondary | ICD-10-CM

## 2021-12-16 NOTE — Progress Notes (Signed)
ROOM # 1    Identified pt with two pt identifiers(name and DOB). Reviewed record in preparation for visit and have obtained necessary documentation.  Chief Complaint   Patient presents with    Mass     Large mass base of neck-painful    Gait Problem     Unsteady, is not using walker. Is currently doing PT    Home Visit     Requesting home health states he just can't get it together.      Hunter Higgins preferred language for health care discussion is Medical laboratory scientific officer.    Is the patient using any DME equipment during OV? NO    [name] is due for:  Health Maintenance Due   Topic    Diabetic foot exam     Diabetic retinal exam     Hepatitis C screen     Shingles vaccine (1 of 2)    Hepatitis B vaccine (1 of 3 - Risk Dialysis 4-dose series)    Colorectal Cancer Screen     DTaP/Tdap/Td vaccine (3 - Td or Tdap)    AAA screen      Health Maintenance reviewed and discussed per provider  Please order/place referral if appropriate.    1. For patients aged 68-75: Has the patient had a colonoscopy? CARE GAP PRESENT      Advance Directive:  1. Do you have an advance directive in place? Patient Reply: NOT ASKED    2. If not, would you like material regarding how to put one in place?  NOT ASKED    Coordination of Care:  1. Have you been to the ER, urgent care clinic since your last visit?  Hospitalized since your last visit? NO    2. Have you seen or consulted any other health care providers outside of the [facility] since your last visit? Include any pap smears or colon screening. NO    Patient is accompanied by HIMSELF I have received verbal consent from Hunter Higgins to discuss any/all medical information while they are present in the room.    Learning Assessment:  No flowsheet data found.  Depression Screening:  No flowsheet data found.  Abuse Screening:  No flowsheet data found.  Fall Risk  No flowsheet data found.  Recent Travel Screening and Travel History documentation     Travel Screening     No  screening recorded since 12/15/21 0000       Travel History   Travel since 11/16/21    No documented travel since 11/16/21

## 2021-12-16 NOTE — Progress Notes (Signed)
Progress Note    Patient: Hunter Higgins               Sex: male                  Date of Birth:  08/12/1951      Age:  71 y.o.                    HPI:     Hunter Higgins is a 71 y.o. male who has been seen for a possible large lipoma in posterior  neck . He has some pain  on turning his head    Past Medical History:   Diagnosis Date   . CAD (coronary artery disease)    . Diabetes ([place])    . Diabetic foot ulcer ([place])     Left   . Dialysis patient ([place])    . ESRD (end stage renal disease) ([place])    . Foot ulcer ([place])    . Hypertension    . Retinopathy    . Visual impairment        Past Surgical History:   Procedure Laterality Date   . DEBRIDEMENT OPEN WOUND 20 SQ CM< Left     foot   . PROSTATE SURGERY         No family history on file.      Current Outpatient Medications:   [name]  Multiple Vitamins-Minerals (MULTIVITAMIN MEN 50+ PO), Take 1 tablet by mouth daily, Disp: , Rfl:   .  acetaminophen (TYLENOL) 325 MG tablet, Take by mouth, Disp: , Rfl:   .  insulin lispro (HUMALOG) 100 UNIT/ML SOLN injection vial, Inject into the skin, Disp: , Rfl:   .  Dulaglutide (TRULICITY) 3.29 JJ/8.8CZ SOPN, Inject 0.75 mg into the skin every 7 days, Disp: 5 Adjustable Dose Pre-filled Pen Syringe, Rfl: 3  .  gabapentin (NEURONTIN) 300 MG capsule, Take 1 capsule by mouth 3 times daily for 90 days. Max Daily Amount: 900 mg, Disp: 270 capsule, Rfl: 0  .  traZODone (DESYREL) 50 MG tablet, Take 1 tablet by mouth nightly, Disp: 30 tablet, Rfl: 3  .  aspirin 81 MG EC tablet, Take 1 tablet by mouth daily, Disp: , Rfl:   .  hydrALAZINE (APRESOLINE) 100 MG tablet, Take by mouth (Patient not taking: Reported on 12/16/2021), Disp: , Rfl:   .  atorvastatin (LIPITOR) 20 MG tablet, Take 1 tablet by mouth daily (Patient not taking: Reported on 12/16/2021), Disp: 30 tablet, Rfl: 2  .  clopidogrel (PLAVIX) 75 MG tablet, Take 1 tablet by mouth daily (Patient not taking: Reported on 12/16/2021), Disp: 30 tablet, Rfl: 2  .  metoprolol succinate (TOPROL XL) 25 MG  extended release tablet, Take 1 tablet by mouth daily (Patient not taking: Reported on 12/16/2021), Disp: 30 tablet, Rfl: 2  .  furosemide (LASIX) 20 MG tablet, Take by mouth daily (Patient not taking: Reported on 11/16/2021), Disp: , Rfl:   .  insulin glargine (LANTUS SOLOSTAR) 100 UNIT/ML injection pen, Inject 5 Units into the skin (Patient not taking: Reported on 11/16/2021), Disp: , Rfl:   .  isosorbide mononitrate (IMDUR) 30 MG extended release tablet, Take 1 tablet by mouth (Patient not taking: Reported on 12/16/2021), Disp: , Rfl:   .  melatonin 3 MG TABS tablet, Take 1 tablet by mouth (Patient not taking: Reported on 12/16/2021), Disp: , Rfl:   .  ofloxacin (FLOXIN) 0.3 % otic solution, Place 5 drops in ear(s) daily (  Patient not taking: Reported on 11/16/2021), Disp: , Rfl:   .  torsemide (DEMADEX) 100 MG tablet, Take 1 tablet by mouth daily (Patient not taking: Reported on 12/16/2021), Disp: , Rfl:      Allergies   Allergen Reactions   . Ace Inhibitors Cough, Itching and Other (See Comments)     cough  Other reaction(s): Cough  cough  cough   . Naproxen Anaphylaxis, Shortness Of Breath and Swelling     Other reaction(s): gi distress, Other, Other (See Comments)  Throat swells. cannot breathe, and causes GI distress  REACTION: Throat swells and cannot breathe  anaphylaxis  REACTION: Throat swells and cannot breathe  anaphylaxis  Throat swells. cannot breathe, and causes GI distress       Review of Systems   Genitourinary:  Positive for penile pain.        Physical Exam:      Visit Vitals  BP (!) 181/79 (Site: Right Upper Arm, Position: Sitting, Cuff Size: Large Adult)   Pulse 66   Resp 22   Ht 5\' 7"  (1.702 m)   Wt 160 lb 9.6 oz (72.8 kg)   SpO2 100%   BMI 25.15 kg/m       Physical Exam  Constitutional:       Appearance: Normal appearance.   Skin:     Findings: Lesion present.      Comments: Possible  large Lipoma posterior neck    Neurological:      Mental Status: He is alert.        Labs Reviewed:      Assessment/Plan        ICD-10-CM    1. Mass in neck  R22.1 External Referral To Neurosurgery           Briant Cedar, MD

## 2021-12-17 ENCOUNTER — Emergency Department (HOSPITAL_COMMUNITY)
Admission: EM | Admit: 2021-12-17 | Discharge: 2021-12-18 | Disposition: A | Discharge status: Home / Self Care | Payer: Medicare Other | Attending: Emergency Medicine | Admitting: Emergency Medicine

## 2021-12-17 ENCOUNTER — Other Ambulatory Visit: Payer: Self-pay

## 2021-12-17 ENCOUNTER — Emergency Department (HOSPITAL_COMMUNITY): Payer: Medicare Other

## 2021-12-17 ENCOUNTER — Encounter (HOSPITAL_COMMUNITY): Payer: Self-pay

## 2021-12-17 DIAGNOSIS — Z794 Long term (current) use of insulin: Secondary | ICD-10-CM | POA: Insufficient documentation

## 2021-12-17 DIAGNOSIS — I10 Essential (primary) hypertension: Secondary | ICD-10-CM

## 2021-12-17 DIAGNOSIS — Z79899 Other long term (current) drug therapy: Secondary | ICD-10-CM | POA: Insufficient documentation

## 2021-12-17 DIAGNOSIS — E875 Hyperkalemia: Secondary | ICD-10-CM | POA: Insufficient documentation

## 2021-12-17 DIAGNOSIS — I120 Hypertensive chronic kidney disease with stage 5 chronic kidney disease or end stage renal disease: Secondary | ICD-10-CM | POA: Insufficient documentation

## 2021-12-17 DIAGNOSIS — Z992 Dependence on renal dialysis: Secondary | ICD-10-CM | POA: Diagnosis not present

## 2021-12-17 DIAGNOSIS — Z7902 Long term (current) use of antithrombotics/antiplatelets: Secondary | ICD-10-CM | POA: Diagnosis not present

## 2021-12-17 DIAGNOSIS — R0602 Shortness of breath: Secondary | ICD-10-CM | POA: Insufficient documentation

## 2021-12-17 DIAGNOSIS — E1122 Type 2 diabetes mellitus with diabetic chronic kidney disease: Secondary | ICD-10-CM | POA: Insufficient documentation

## 2021-12-17 DIAGNOSIS — Z7982 Long term (current) use of aspirin: Secondary | ICD-10-CM | POA: Insufficient documentation

## 2021-12-17 DIAGNOSIS — N186 End stage renal disease: Secondary | ICD-10-CM | POA: Diagnosis present

## 2021-12-17 LAB — I-STAT CHEM 8, ED
BUN: 78 mg/dL — ABNORMAL HIGH (ref 8–23)
Calcium, Ion: 1.09 mmol/L — ABNORMAL LOW (ref 1.15–1.40)
Chloride: 103 mmol/L (ref 98–111)
Creatinine, Ser: 8.8 mg/dL — ABNORMAL HIGH (ref 0.61–1.24)
Glucose, Bld: 129 mg/dL — ABNORMAL HIGH (ref 70–99)
HCT: 41 % (ref 39.0–52.0)
Hemoglobin: 13.9 g/dL (ref 13.0–17.0)
Potassium: 5.5 mmol/L — ABNORMAL HIGH (ref 3.5–5.1)
Sodium: 140 mmol/L (ref 135–145)
TCO2: 29 mmol/L (ref 22–32)

## 2021-12-17 LAB — CBC WITH DIFFERENTIAL/PLATELET
Abs Immature Granulocytes: 0.02 10*3/uL (ref 0.00–0.07)
Basophils Absolute: 0 10*3/uL (ref 0.0–0.1)
Basophils Relative: 1 %
Eosinophils Absolute: 0.2 10*3/uL (ref 0.0–0.5)
Eosinophils Relative: 5 %
HCT: 38.1 % — ABNORMAL LOW (ref 39.0–52.0)
Hemoglobin: 12 g/dL — ABNORMAL LOW (ref 13.0–17.0)
Immature Granulocytes: 1 %
Lymphocytes Relative: 23 %
Lymphs Abs: 1 10*3/uL (ref 0.7–4.0)
MCH: 22.2 pg — ABNORMAL LOW (ref 26.0–34.0)
MCHC: 31.5 g/dL (ref 30.0–36.0)
MCV: 70.4 fL — ABNORMAL LOW (ref 80.0–100.0)
Monocytes Absolute: 0.6 10*3/uL (ref 0.1–1.0)
Monocytes Relative: 13 %
Neutro Abs: 2.4 10*3/uL (ref 1.7–7.7)
Neutrophils Relative %: 57 %
Platelets: 162 10*3/uL (ref 150–400)
RBC: 5.41 MIL/uL (ref 4.22–5.81)
RDW: 15.3 % (ref 11.5–15.5)
WBC: 4.2 10*3/uL (ref 4.0–10.5)
nRBC: 0 % (ref 0.0–0.2)

## 2021-12-17 LAB — BASIC METABOLIC PANEL
Anion gap: 12 (ref 5–15)
BUN: 81 mg/dL — ABNORMAL HIGH (ref 8–23)
CO2: 27 mmol/L (ref 22–32)
Calcium: 8.6 mg/dL — ABNORMAL LOW (ref 8.9–10.3)
Chloride: 102 mmol/L (ref 98–111)
Creatinine, Ser: 8.53 mg/dL — ABNORMAL HIGH (ref 0.61–1.24)
GFR, Estimated: 6 mL/min — ABNORMAL LOW (ref >60)
Glucose, Bld: 131 mg/dL — ABNORMAL HIGH (ref 70–99)
Potassium: 5.4 mmol/L — ABNORMAL HIGH (ref 3.5–5.1)
Sodium: 141 mmol/L (ref 135–145)

## 2021-12-17 LAB — TROPONIN I (HIGH SENSITIVITY): Troponin I (High Sensitivity): 46 ng/L — ABNORMAL HIGH (ref <18)

## 2021-12-17 NOTE — ED Provider Triage Note (Signed)
Emergency Medicine Provider Triage Evaluation Note ? ?[name] , a 71 y.o. male  was evaluated in triage.  Pt complains of sob, weakness.ESRD-patient normally dialyzes Monday Wednesday Friday.  He lives in [place].  Patient states that he missed dialysis today because he had a court date in [place].  He was on his way to visit friends here in [place] on his way home when he started feeling extremely short of breath.  He has no other complaints at this time. ? ?Review of Systems  ?Positive: sob ?Negative: Feve, cp ? ?Physical Exam  ?Pulse 68   Resp 18  ?Gen:   Awake, no distress   ?Resp:  Normal effort  ?MSK:   Moves extremities without difficulty  ?Other:  sbo ? ?Medical Decision Making  ?Medically screening exam initiated at 9:05 PM.  Appropriate orders placed.  [name] was informed that the remainder of the evaluation will be completed by another provider, this initial triage assessment does not replace that evaluation, and the importance of remaining in the ED until their evaluation is complete. ? ?Work up initiated ?  ?Margarita Mail, PA-C ?12/17/21 2113 ? ?

## 2021-12-17 NOTE — ED Triage Notes (Signed)
Patient said he missed his dialysis today. He is supposed to go [facility]. Feeling very fatigued, no energy. He did go Wednesday.  ?

## 2021-12-18 LAB — HEPATITIS B SURFACE ANTIGEN: Hepatitis B Surface Ag: NONREACTIVE

## 2021-12-18 LAB — BASIC METABOLIC PANEL
Anion gap: 15 (ref 5–15)
BUN: 88 mg/dL — ABNORMAL HIGH (ref 8–23)
CO2: 24 mmol/L (ref 22–32)
Calcium: 8.5 mg/dL — ABNORMAL LOW (ref 8.9–10.3)
Chloride: 101 mmol/L (ref 98–111)
Creatinine, Ser: 8.77 mg/dL — ABNORMAL HIGH (ref 0.61–1.24)
GFR, Estimated: 6 mL/min — ABNORMAL LOW (ref >60)
Glucose, Bld: 123 mg/dL — ABNORMAL HIGH (ref 70–99)
Potassium: 4.5 mmol/L (ref 3.5–5.1)
Sodium: 140 mmol/L (ref 135–145)

## 2021-12-18 LAB — TROPONIN I (HIGH SENSITIVITY): Troponin I (High Sensitivity): 29 ng/L — ABNORMAL HIGH (ref <18)

## 2021-12-18 LAB — HEPATITIS B SURFACE ANTIBODY,QUALITATIVE: Hep B S Ab: NONREACTIVE

## 2021-12-18 LAB — BRAIN NATRIURETIC PEPTIDE: B Natriuretic Peptide: 449.7 pg/mL — ABNORMAL HIGH (ref 0.0–100.0)

## 2021-12-18 MED ORDER — CHLORHEXIDINE GLUCONATE CLOTH 2 % EX PADS: 6.0000 | MEDICATED_PAD | Freq: Every day | CUTANEOUS | Status: DC

## 2021-12-18 MED ORDER — HYDRALAZINE HCL 25 MG PO TABS
50.0000 mg | ORAL_TABLET | Freq: Once | ORAL | Status: AC
  Administered 2021-12-18: 50 mg via ORAL
  Filled 2021-12-18: qty 2

## 2021-12-18 MED ORDER — HEPARIN SODIUM (PORCINE) 1000 UNIT/ML DIALYSIS: 2000.0000 [IU] | INTRAMUSCULAR | Status: DC | PRN

## 2021-12-18 MED ORDER — HEPARIN SODIUM (PORCINE) 1000 UNIT/ML IJ SOLN
INTRAMUSCULAR | Status: AC
  Filled 2021-12-18: qty 4

## 2021-12-18 MED ORDER — SODIUM ZIRCONIUM CYCLOSILICATE 10 G PO PACK: 10.0000 g | PACK | Freq: Once | ORAL | Status: DC

## 2021-12-18 NOTE — ED Provider Notes (Signed)
Patient transferred back to [facility] after being dialyzed at [facility].  He states he feels much better.  He has no complaints of fatigue at this time. ? ?Blood pressure noted to be elevated given his hydralazine, patient states his pressure runs along these levels after dialysis most of the time.  Otherwise asymptomatic and discharged home.  Advised follow-up with his doctor in 2 or 3 days, advised return for any additional concerns fevers pain or additional questions. ?  ?Luna Fuse, MD ?12/18/21 1747 ? ?

## 2021-12-18 NOTE — ED Provider Notes (Signed)
?[facility] DEPT ?Provider Note ? ? ?CSN: [id number] ?Arrival date & time: 12/17/21  2055 ? ?  ? ?History ? ?Chief Complaint  ?Patient presents with  ? Needs Dialysis  ? ? ?[name] is a 71 y.o. male. With past medical history of T2DM, GERD, sarcoidosis, HTN, ESRD on HD MWF who presents to the emergency department requesting dialysis. ? ?States he was supposed to have dialysis on Friday however had to appear in court and so he missed his appointment.  He states that he usually gets dialysis in [place], where he is from, but was here in [place] for his court.  He states that his friend brought him here so that he could receive dialysis.  He states that last dialysis was Wednesday.  He feels fatigued and states his breathing has become slightly more labored.  He denies any chest pain, abdominal pain, nausea or vomiting. ? ?HPI ? ?  ? ?Home Medications ?Prior to Admission medications   ?Medication Sig Start Date End Date Taking? Authorizing Provider  ?acetaminophen (TYLENOL) 325 MG tablet Take by mouth. 01/01/21   [provider]  ?amLODipine (NORVASC) 10 MG tablet Take 1 tablet (10 mg total) by mouth daily. 12/26/19   O'NealCassie Freer, MD  ?amLODipine (NORVASC) 5 MG tablet Take by mouth. 01/08/21   [provider]  ?amoxicillin-clavulanate (AUGMENTIN) 875-125 MG tablet Take 1 tablet by mouth 2 (two) times daily. 04/16/20   Landis Martins, DPM  ?aspirin 81 MG chewable tablet Chew 1 tablet (81 mg total) by mouth every morning. ?Patient taking differently: Chew 81 mg by mouth daily.  09/25/19   O'NealCassie Freer, MD  ?aspirin 81 MG EC tablet Take 1 tablet by mouth daily. 07/03/15   [provider]  ?atorvastatin (LIPITOR) 20 MG tablet Take by mouth. 01/01/21   [provider]  ?atorvastatin (LIPITOR) 40 MG tablet Take 1 tablet (40 mg total) by mouth every morning. 04/24/20   Daleen Squibb, MD  ?B Complex-C-Folic Acid (RENAL)  1 MG CAPS Take by mouth. 01/01/21   [provider]  ?blood glucose meter kit and supplies KIT Dispense based on patient and insurance preference. Use up to four times daily as directed. 09/10/19   Philemon Kingdom, MD  ?blood glucose meter kit and supplies Dispense based on patient and insurance preference. Use up to four times daily as directed. (FOR ICD-10 E10.9, E11.9). 01/03/20   Leanor Kail, PA  ?Blood Glucose Monitoring Suppl (RELION CONFIRM GLUCOSE MONITOR) w/Device KIT Use to check blood sugar 3 times a day. 09/10/19   Philemon Kingdom, MD  ?carbamide peroxide (DEBROX) 6.5 % OTIC solution Instill 5 Drops in both ears Twice Daily. 07/25/21   [provider]  ?clopidogrel (PLAVIX) 75 MG tablet Take 1 tablet (75 mg total) by mouth daily with breakfast. 04/24/20   Jacelyn Pi, Lilia Argue, MD  ?clopidogrel (PLAVIX) 75 MG tablet Take by mouth. 01/01/21   [provider]  ?Dulaglutide (TRULICITY) 8.33 AS/5.0NL SOPN Inject into the skin.    [provider]  ?famotidine (PEPCID) 20 MG tablet Take by mouth. 01/01/21   [provider]  ?furosemide (LASIX) 20 MG tablet Take by mouth.    [provider]  ?gabapentin (NEURONTIN) 300 MG capsule Take 1 capsule (300 mg total) by mouth 2 (two) times daily. 03/20/20   Daleen Squibb, MD  ?gabapentin (NEURONTIN) 300 MG capsule Take by mouth. 11/16/21 02/14/22  [provider]  ?  hydrALAZINE (APRESOLINE) 10 MG tablet Take 1 tablet (10 mg total) by mouth every 8 (eight) hours. 02/20/20   Daleen Squibb, MD  ?hydrALAZINE (APRESOLINE) 100 MG tablet Take by mouth. 01/01/21   [provider]  ?Insulin Glargine (BASAGLAR KWIKPEN) 100 UNIT/ML Inject 0.05 mLs (5 Units total) into the skin at bedtime. 03/20/20 06/18/20  Daleen Squibb, MD  ?insulin lispro (HUMALOG) 100 UNIT/ML injection Inject into the skin. 01/07/21   [provider]  ?Insulin Pen Needle 32G X 6 MM MISC Use new needle with each  basaglar injection. Dx E11.9, Z79.4 03/20/20   Jacelyn Pi, Lilia Argue, MD  ?isosorbide mononitrate (IMDUR) 30 MG 24 hr tablet Take by mouth. 05/11/21   [provider]  ?Lancets (FREESTYLE) lancets Use as instructed 09/18/14   Delfina Redwood, MD  ?[place]  04/06/16   [provider]  ?losartan (COZAAR) 50 MG tablet Take 1 tablet by mouth daily. 02/02/18   [provider]  ?melatonin 3 MG TABS tablet Take by mouth. 01/07/21   [provider]  ?metoprolol succinate (TOPROL-XL) 25 MG 24 hr tablet Take 1 tablet by mouth daily. 05/11/21   [provider]  ?Multiple Vitamin (MULTIVITAMIN WITH MINERALS) TABS tablet Take 1 tablet by mouth daily. Centrum    [provider]  ?nitroGLYCERIN (NITROSTAT) 0.4 MG SL tablet Place under the tongue. 01/01/21   [provider]  ?ofloxacin (FLOXIN) 0.3 % OTIC solution Place 5 drops into the left ear daily. 01/07/20   Horton, Barbette Hair, MD  ?ofloxacin (FLOXIN) 0.3 % OTIC solution Place in ear(s). 01/07/20   [provider]  ?Polyethyl Glycol-Propyl Glycol (SYSTANE OP) Place 1 drop into both eyes as needed (for dryness).     [provider]  ?tamsulosin (FLOMAX) 0.4 MG CAPS capsule Take by mouth. 01/07/21   [provider]  ?torsemide (DEMADEX) 20 MG tablet Take 1 tablet (20 mg total) by mouth daily. 04/24/20   Daleen Squibb, MD  ?traZODone (DESYREL) 50 MG tablet Take 0.5-1 tablets (25-50 mg total) by mouth at bedtime as needed for sleep. 04/24/20   Daleen Squibb, MD  ?Wound Dressings (MEDIHONEY WOUND/BURN DRESSING) GEL Apply to affected are 3 times a week, and cover with sterile dressing. 08/21/19   Landis Martins, DPM  ?   ? ?Allergies    ?Ace inhibitors and Naproxen   ? ?Review of Systems   ?Review of Systems  ?Constitutional:  Positive for fatigue.  ?Respiratory:  Positive for shortness of breath.   ?All other systems reviewed and are negative. ? ?Physical Exam ?Updated Vital  Signs ?BP (!) 198/67   Pulse (!) 56   Temp 98.1 ?F (36.7 ?C) (Oral)   Resp 16   SpO2 99%  ?Physical Exam ?Vitals and nursing note reviewed.  ?Constitutional:   ?   General: He is not in acute distress. ?   Appearance: Normal appearance. He is not ill-appearing or toxic-appearing.  ?HENT:  ?   Head: Normocephalic and atraumatic.  ?   Mouth/Throat:  ?   Mouth: Mucous membranes are dry.  ?   Pharynx: Oropharynx is clear.  ?Eyes:  ?   General: No scleral icterus. ?   Extraocular Movements: Extraocular movements intact.  ?   Pupils: Pupils are equal, round, and reactive to light.  ?Cardiovascular:  ?   Rate and Rhythm: Normal rate and regular rhythm.  ?   Pulses: Normal pulses.  ?  Heart sounds: No murmur heard. ?Pulmonary:  ?   Effort: Pulmonary effort is normal. No respiratory distress.  ?   Breath sounds: Normal breath sounds. No rales.  ?Abdominal:  ?   General: Bowel sounds are normal. There is no distension.  ?   Palpations: Abdomen is soft.  ?   Tenderness: There is no abdominal tenderness.  ?Musculoskeletal:     ?   General: Normal range of motion.  ?   Cervical back: Neck supple.  ?   Right lower leg: 1+ Pitting Edema present.  ?   Left lower leg: 1+ Pitting Edema present.  ?Skin: ?   General: Skin is warm and dry.  ?   Capillary Refill: Capillary refill takes less than 2 seconds.  ?   Comments: Right chest port   ?Neurological:  ?   General: No focal deficit present.  ?   Mental Status: He is alert and oriented to person, place, and time. Mental status is at baseline.  ?Psychiatric:     ?   Mood and Affect: Mood normal.     ?   Behavior: Behavior normal.     ?   Thought Content: Thought content normal.     ?   Judgment: Judgment normal.  ? ? ?ED Results / Procedures / Treatments   ?Labs ?(all labs ordered are listed, but only abnormal results are displayed) ?Labs Reviewed  ?BASIC METABOLIC PANEL - Abnormal; Notable for the following components:  ?    Result Value  ? Potassium 5.4 (*)   ? Glucose, Bld 131  (*)   ? BUN 81 (*)   ? Creatinine, Ser 8.53 (*)   ? Calcium 8.6 (*)   ? GFR, Estimated 6 (*)   ? All other components within normal limits  ?CBC WITH DIFFERENTIAL/PLATELET - Abnormal; Notable for the following co

## 2021-12-18 NOTE — ED Notes (Signed)
[facility] has been called for pt transport to [facility] ?

## 2021-12-18 NOTE — ED Notes (Signed)
Pt states understanding of dc instructions, importance of follow up. Pt denies questions or concerns and provided wheelchair assistance upon dc. No belongings left in room upon dc. ? ?

## 2021-12-18 NOTE — Procedures (Signed)
? ?  I was present at this dialysis session, have reviewed the session itself and made  appropriate changes ?Kelly Splinter MD ?[employer] ?pager [phone]   ?12/18/2021, 10:50 AM ? ? ?

## 2021-12-18 NOTE — Progress Notes (Signed)
Asked to see this dialysis patient.  He missed Friday session yesterday because he was at a court appt in [place], [place].  He presented to ED c/o fatigue, no energy. Last HD Wed in [place], [place], [place] unit. Has been on HD for about 8 weeks, MWF schedule using R chest TDC.  Has DM and HTN as cause of ESRD.  No SOB or orthopnea. On exam no jvd, clear lungs, on RA, 2+ bilat pretib and pedal edema, no other edema. BP's on the high side. R chest TDC. Pt is alert and in no distress. Upset about having to wait so long for dialysis. Labs show k+ 5.5, repeat 4.5. CXR clear. Plan is for "ED HD" where pt is technically not admitted and remains an ED patient. He will go to the HD unit and get dialysis, then will return to ED for reassessment before dc home. Please call w/ any questions.  ? ?Kelly Splinter, MD ?12/18/2021, 8:41 AM ?[phone] ? ? ? ? ?

## 2021-12-18 NOTE — Discharge Instructions (Signed)
Call your primary care doctor or specialist as discussed in the next 2-3 days.   Return immediately back to the ER if:  Your symptoms worsen within the next 12-24 hours. You develop new symptoms such as new fevers, persistent vomiting, new pain, shortness of breath, or new weakness or numbness, or if you have any other concerns.  

## 2021-12-20 LAB — HEPATITIS B SURFACE ANTIBODY, QUANTITATIVE: Hep B S AB Quant (Post): <3.1 m[IU]/mL — ABNORMAL LOW (ref >9.9)

## 2021-12-21 ENCOUNTER — Encounter: Payer: MEDICARE | Primary: Geriatric Medicine

## 2021-12-23 ENCOUNTER — Encounter: Payer: MEDICARE | Primary: Geriatric Medicine

## 2021-12-23 NOTE — Telephone Encounter (Signed)
Patient called to check the status of the home health referral.  When I check the referral this is the note placed by Home Health.    Type Date User Summary Attachment   Referral Entry Note 12/16/2021  4:24 PM Knute Neu, LPN - -   Note:    [place] unable to do just a MSW and HHA must have a Skill. Thank you

## 2021-12-23 NOTE — Telephone Encounter (Signed)
Spoke to patient  and his case manager who happened to be there at the time . He needs personal help care and she is working on that . He does not have a skilled need for home health care at this time .

## 2021-12-28 ENCOUNTER — Encounter: Payer: MEDICARE | Primary: Geriatric Medicine

## 2021-12-28 ENCOUNTER — Ambulatory Visit: Payer: MEDICARE | Attending: Cardiovascular Disease | Primary: Geriatric Medicine

## 2021-12-30 ENCOUNTER — Encounter: Payer: MEDICARE | Primary: Geriatric Medicine

## 2022-02-17 ENCOUNTER — Ambulatory Visit
Admit: 2022-02-17 | Discharge: 2022-02-17 | Payer: MEDICARE | Attending: Geriatric Medicine | Primary: Geriatric Medicine

## 2022-02-17 DIAGNOSIS — E0800 Diabetes mellitus due to underlying condition with hyperosmolarity without nonketotic hyperglycemic-hyperosmolar coma (NKHHC): Secondary | ICD-10-CM

## 2022-02-17 LAB — AMB POC HEMOGLOBIN A1C: Hemoglobin A1C, POC: 7.2 %

## 2022-02-17 MED ORDER — TRULICITY 0.75 MG/0.5ML SC SOPN
0.750.5 MG/0.5ML | SUBCUTANEOUS | 3 refills | Status: AC
Start: 2022-02-17 — End: 2022-07-26

## 2022-02-17 NOTE — Progress Notes (Signed)
Progress Note    Patient: Hunter Higgins               Sex: male                  Date of Birth:  Oct 22, 1950      Age:  71 y.o.                    HPI:     Hunter Higgins is a 71 y.o. male who has been seen for  followup of hypertension , DM   He states  he needs help with diet at home. He requests "Meals on wheels"  Past Medical History:   Diagnosis Date    CAD (coronary artery disease)     Diabetes ([place])     Diabetic foot ulcer ([place])     Left    Dialysis patient ([place])     ESRD (end stage renal disease) ([place])     Foot ulcer ([place])     Hypertension     Retinopathy     Visual impairment        Past Surgical History:   Procedure Laterality Date    DEBRIDEMENT OPEN WOUND 20 SQ CM< Left     foot    PROSTATE SURGERY         No family history on file.      Current Outpatient Medications:     Dulaglutide (TRULICITY) 2.70 WC/3.7SE SOPN, Inject 0.75 mg into the skin every 7 days, Disp: 5 Adjustable Dose Pre-filled Pen Syringe, Rfl: 3    Multiple Vitamins-Minerals (MULTIVITAMIN MEN 50+ PO), Take 1 tablet by mouth daily, Disp: , Rfl:     acetaminophen (TYLENOL) 325 MG tablet, Take by mouth, Disp: , Rfl:     traZODone (DESYREL) 50 MG tablet, Take 1 tablet by mouth nightly, Disp: 30 tablet, Rfl: 3    aspirin 81 MG EC tablet, Take 1 tablet by mouth daily, Disp: , Rfl:     furosemide (LASIX) 20 MG tablet, Take by mouth daily, Disp: , Rfl:     torsemide (DEMADEX) 100 MG tablet, Take 1 tablet by mouth daily, Disp: , Rfl:     hydrALAZINE (APRESOLINE) 100 MG tablet, Take by mouth (Patient not taking: Reported on 12/16/2021), Disp: , Rfl:     insulin lispro (HUMALOG) 100 UNIT/ML SOLN injection vial, Inject into the skin, Disp: , Rfl:     atorvastatin (LIPITOR) 20 MG tablet, Take 1 tablet by mouth daily (Patient not taking: Reported on 12/16/2021), Disp: 30 tablet, Rfl: 2    clopidogrel (PLAVIX) 75 MG tablet, Take 1 tablet by mouth daily (Patient not taking: Reported on 12/16/2021), Disp: 30 tablet, Rfl: 2    metoprolol succinate (TOPROL XL) 25  MG extended release tablet, Take 1 tablet by mouth daily (Patient not taking: Reported on 12/16/2021), Disp: 30 tablet, Rfl: 2    gabapentin (NEURONTIN) 300 MG capsule, Take 1 capsule by mouth 3 times daily for 90 days. Max Daily Amount: 900 mg, Disp: 270 capsule, Rfl: 0    insulin glargine (LANTUS SOLOSTAR) 100 UNIT/ML injection pen, Inject 5 Units into the skin (Patient not taking: Reported on 11/16/2021), Disp: , Rfl:     isosorbide mononitrate (IMDUR) 30 MG extended release tablet, Take 1 tablet by mouth (Patient not taking: Reported on 12/16/2021), Disp: , Rfl:     melatonin 3 MG TABS tablet, Take 1 tablet by mouth (Patient not taking: Reported on 12/16/2021), Disp: , Rfl:  ofloxacin (FLOXIN) 0.3 % otic solution, Place 5 drops in ear(s) daily (Patient not taking: Reported on 11/16/2021), Disp: , Rfl:      Allergies   Allergen Reactions    Ace Inhibitors Cough, Itching and Other (See Comments)     cough  Other reaction(s): Cough  cough  cough    Naproxen Anaphylaxis, Shortness Of Breath and Swelling     Other reaction(s): gi distress, Other, Other (See Comments)  Throat swells. cannot breathe, and causes GI distress  REACTION: Throat swells and cannot breathe  anaphylaxis  REACTION: Throat swells and cannot breathe  anaphylaxis  Throat swells. cannot breathe, and causes GI distress       Review of Systems   Constitutional:  Positive for chills and fever.        Occas chills and fever    Respiratory: Negative.     Cardiovascular: Negative.    Genitourinary: Negative.    Neurological:  Positive for dizziness.        Physical Exam:      Visit Vitals  BP (!) 143/60 (Site: Left Upper Arm, Position: Sitting, Cuff Size: Medium Adult)   Pulse 66   Resp 14   Ht 5\' 7"  (1.702 m)   Wt 164 lb 4 oz (74.5 kg)   SpO2 99%   BMI 25.73 kg/m       Physical Exam  HENT:      Head: Normocephalic and atraumatic.   Cardiovascular:      Rate and Rhythm: Normal rate and regular rhythm.   Musculoskeletal:         General: Swelling present.       Comments: Large lipoma posterior neck .    Skin:     General: Skin is warm and dry.   Neurological:      Mental Status: He is alert.        Labs Reviewed:  HGBA1C is 7.0    Assessment/Plan       ICD-10-CM    1. Diabetes mellitus due to underlying condition with hyperosmolarity without nonketotic hyperglycemic-hyperosmolar coma [facility]) (HCC)  E08.00 AMB POC HEMOGLOBIN A1C     Dulaglutide (TRULICITY) 3.55 DD/2.2GU SOPN      2. At high risk for falls  Z91.81 [facility] [address]      3. Mass in neck  R22.1 [place] - Britt Bolognese, MD, General Surgery, [facility]      4. Lipoma of neck  D17.0 [place] - Britt Bolognese, MD, General Surgery, [facility]       Given info  on "meals on wheels "     Briant Cedar, MD    On the basis of positive falls risk screening, assessment and plan is as follows: referral to physical therapy provided for strength and balance training.

## 2022-02-24 ENCOUNTER — Ambulatory Visit: Admit: 2022-02-24 | Discharge: 2022-02-24 | Payer: MEDICARE | Attending: Surgery | Primary: Geriatric Medicine

## 2022-02-24 DIAGNOSIS — M7989 Other specified soft tissue disorders: Secondary | ICD-10-CM

## 2022-02-24 NOTE — Progress Notes (Signed)
General Surgery Consult    Hunter Higgins  Admit date: (Not on file)    MRN: [id number]     DOB: Oct 17, 1950     Age: 71 y.o.        Attending Physician: Earlyne Iba, MD, FACS      History of Present Illness:      Hunter Higgins is a 71 y.o. male who is referred to me by Dr. Valere Dross for evaluation of a large posterior neck mass.  The patient has multiple significant medical conditions including coronary artery disease and renal failure on dialysis in addition to other less serious comorbidities who is presenting with a posterior neck mass that has been present for years.  The patient stated that the mass has been increasing significantly in size over the last few years and it has been present for more than 20 years.  Though it is not causing significant pain it causes sometimes discomfort especially when he is doing dialysis.     Patient Active Problem List    Diagnosis Date Noted    Dialysis patient [facility])     Foot ulcer ([place])     Severe protein-calorie malnutrition ([place]) 12/31/2020    NSTEMI (non-ST elevated myocardial infarction) ([place]) 12/23/2020    Skin ulcer of left foot, limited to breakdown of skin ([place]) 11/12/2020    Diabetic foot infection ([place]) 11/12/2020    Hyperglycemia 11/12/2020    Moderate protein-calorie malnutrition ([place]) 11/12/2020    Hyperkalemia 11/12/2020    Charcot's joint of foot, left 11/12/2020    Charcot foot due to diabetes mellitus ([place]) 11/12/2020    Acute renal failure with acute tubular necrosis superimposed on stage 4 chronic kidney disease ([place]) 38/05/1750    Acute diastolic HF (heart failure) ([place]) 11/12/2020    Vascular complication     Visual impairment     Diabetes ([place])     Sudden hearing loss, left 01/27/2020    Tympanic membrane rupture 01/08/2020    Dyslipidemia 08/19/2019    Optic atrophy of both eyes 06/13/2019    PVD (peripheral vascular disease) ([place]) 05/14/2019    Complete atrioventricular block ([place]) 05/06/2019    Diabetic foot ulcer ([place]) 10/19/2018    Pseudophakia,  both eyes 10/04/2018    Stage 3 chronic kidney disease ([place]) 09/27/2018    CRI (chronic renal insufficiency), stage 4 (severe) ([place]) 09/27/2018    Adrenal insufficiency ([place]) 04/09/2015    Shortness of breath 09/17/2014    Peripheral edema 09/17/2014    Bronchospasm, acute 09/16/2014    Diabetic retinopathy associated with type 2 diabetes mellitus ([place]) 09/13/2014    Abdominal pain 09/13/2014    Esophageal reflux 05/10/2013    Pharyngitis 09/14/2012    Fluid overload 09/03/2012    Ileus following gastrointestinal surgery ([place]) 09/03/2012    Hypokalemia 08/30/2012    Partial small bowel obstruction ([place]) 08/21/2012    Chest pain 08/21/2012    Abnormal EKG 08/21/2012    Vitreous hemorrhage ([place]) 12/15/2011    Status post intraocular lens implant 12/15/2011    Macular atrophy, retinal 12/15/2011    Macular degeneration (senile) of retina 12/15/2011    Lens replaced 12/15/2011    Type 1 diabetes mellitus with proliferative retinopathy of both eyes without macular edema ([place]) 12/05/2011    Essential hypertension 08/19/2008    Regional enteritis of small intestine ([place]) 01/03/2008    Impotence of organic origin 01/03/2008    Uncontrolled type 2 diabetes with retinopathy 07/19/2007    Sarcoidosis 07/19/2007  Bilateral impacted cerumen 07/19/2007    Urinary incontinence, stress, male 08/17/2006     Past Medical History:   Diagnosis Date    CAD (coronary artery disease)     Diabetes ([place])     Diabetic foot ulcer ([place])     Left    Dialysis patient ([place])     ESRD (end stage renal disease) ([place])     Foot ulcer ([place])     Hypertension     Retinopathy     Visual impairment       Past Surgical History:   Procedure Laterality Date    DEBRIDEMENT OPEN WOUND 20 SQ CM< Left     foot    PROSTATE SURGERY        Social History     Tobacco Use    Smoking status: Former     Packs/day: 0.00     Years: 1.00     Pack years: 0.00     Types: Cigarettes     Start date: 11/04/1967     Quit date: 11/03/1968     Years since quitting: 53.3      Passive exposure: Past    Smokeless tobacco: Never   Substance Use Topics    Alcohol use: Yes      Social History     Tobacco Use   Smoking Status Former    Packs/day: 0.00    Years: 1.00    Pack years: 0.00    Types: Cigarettes    Start date: 11/04/1967    Quit date: 11/03/1968    Years since quitting: 53.3    Passive exposure: Past   Smokeless Tobacco Never     No family history on file.   Current Outpatient Medications   Medication Sig    Dulaglutide (TRULICITY) 5.40 JW/1.1BJ SOPN Inject 0.75 mg into the skin every 7 days    Multiple Vitamins-Minerals (MULTIVITAMIN MEN 50+ PO) Take 1 tablet by mouth daily    acetaminophen (TYLENOL) 325 MG tablet Take by mouth    insulin lispro (HUMALOG) 100 UNIT/ML SOLN injection vial Inject into the skin    traZODone (DESYREL) 50 MG tablet Take 1 tablet by mouth nightly    aspirin 81 MG EC tablet Take 1 tablet by mouth daily    furosemide (LASIX) 20 MG tablet Take by mouth daily    torsemide (DEMADEX) 100 MG tablet Take 1 tablet by mouth daily    gabapentin (NEURONTIN) 300 MG capsule Take 1 capsule by mouth 3 times daily for 90 days. Max Daily Amount: 900 mg     No current facility-administered medications for this visit.      Allergies   Allergen Reactions    Ace Inhibitors Cough, Itching and Other (See Comments)     cough  Other reaction(s): Cough  cough  cough    Naproxen Anaphylaxis, Shortness Of Breath and Swelling     Other reaction(s): gi distress, Other, Other (See Comments)  Throat swells. cannot breathe, and causes GI distress  REACTION: Throat swells and cannot breathe  anaphylaxis  REACTION: Throat swells and cannot breathe  anaphylaxis  Throat swells. cannot breathe, and causes GI distress          Review of Systems:  Pertinent items are noted in the History of Present Illness.    Objective:     Temp 97.2 F (36.2 C) (Temporal)   Ht 5\' 7"  (1.702 m)   Wt 162 lb (73.5 kg)   BMI 25.37 kg/m  Physical Exam:      General:  in no apparent distress, alert, oriented  times 3, and afebrile   Eyes:  conjunctivae and sclerae normal, pupils equal, round, reactive to light   Throat & Neck: Very large mass located on the posterior neck measuring more than 20 cm round.  It is soft and nontender and easily movable consistent with a very large lipoma.   Lungs:   clear to auscultation bilaterally   Heart:  Regular rate and rhythm   Abdomen:   flat, soft, nontender, nondistended, no masses or organomegaly   Extremities: extremities normal, atraumatic, no cyanosis or edema   Skin: Normal.       Imaging and Lab Review:     CBC:   Lab Results   Component Value Date/Time    WBC 6.1 11/16/2021 11:18 AM    RBC 5.73 11/16/2021 11:18 AM    HGB 12.6 11/16/2021 11:18 AM    HCT 41.7 11/16/2021 11:18 AM    PLT 173 11/16/2021 11:18 AM     BMP:   Lab Results   Component Value Date/Time    NA 144 11/16/2021 11:18 AM    K 4.3 11/16/2021 11:18 AM    CL 107 11/16/2021 11:18 AM    CO2 31 11/16/2021 11:18 AM    BUN 47 11/16/2021 11:18 AM     CMP:  Lab Results   Component Value Date/Time    NA 144 11/16/2021 11:18 AM    K 4.3 11/16/2021 11:18 AM    CL 107 11/16/2021 11:18 AM    CO2 31 11/16/2021 11:18 AM    BUN 47 11/16/2021 11:18 AM    GLOB 3.9 11/16/2021 11:18 AM       No results found for this or any previous visit (from the past 24 hour(s)).    images and reports reviewed    Assessment:   Hunter Higgins is a 71 y.o. male presenting with a posterior neck mass that has been present for years and on examination it is consistent with a large lipoma.  Given the fact that the patient has multiple significant medical conditions and the fact that this mass has been present for years and it is benign on examination I do not believe that is good idea to remove it.  We can order imaging studies to confirm the presence of the mass and that it is most likely benign and we can observe it but I advised the patient against removing it because of the risk of possible complication from the mass itself as well as from the  anesthesia.  However the patient is complaining of discomfort and the mass is increasing in size and he is interested in removing it.  I do understand because it is extremely large but I would like to get a CT scan of the neck first and then make a decision with him about the surgery.    Plan:     I placed an order for CT scan of the neck  Follow-up with me after the results    Please call me if you have any questions (cell phone: [phone])     Signed By: Earlyne Iba, MD     February 24, 2022

## 2022-02-24 NOTE — Progress Notes (Signed)
Hunter Higgins is a 71 y.o. male (DOB: April 18, 1951) presenting to address:    Chief Complaint   Patient presents with    New Patient     Posterior neck mass/ referred by Dr. Briant Cedar       Medication list and allergies have been reviewed with Tod Persia and updated as of today's date.     I have gone over all Medical, Surgical and Social History with Tod Persia and updated/added the information accordingly.

## 2022-03-17 ENCOUNTER — Inpatient Hospital Stay: Payer: MEDICARE | Primary: Geriatric Medicine

## 2022-03-22 ENCOUNTER — Ambulatory Visit
Admit: 2022-03-22 | Discharge: 2022-03-22 | Payer: MEDICARE | Attending: Geriatric Medicine | Primary: Geriatric Medicine

## 2022-03-22 DIAGNOSIS — E0800 Diabetes mellitus due to underlying condition with hyperosmolarity without nonketotic hyperglycemic-hyperosmolar coma (NKHHC): Secondary | ICD-10-CM

## 2022-03-22 MED ORDER — FREESTYLE FREEDOM KIT
PACK | Freq: Every day | 0 refills | Status: AC
Start: 2022-03-22 — End: 2022-03-24

## 2022-03-22 NOTE — Progress Notes (Signed)
ROOM # 1  Identified pt with two pt identifiers(name and DOB). Reviewed record in preparation for visit and have obtained necessary documentation.  Chief Complaint   Patient presents with    Diabetes      Hunter Higgins preferred language for health care discussion is english/other.  Is the patient using any DME equipment during OV? no    [name] is due for:  Health Maintenance Due   Topic    Diabetic foot exam     Diabetic retinal exam     Hepatitis C screen     Shingles vaccine (1 of 2)    Hepatitis B vaccine (1 of 3 - Risk Dialysis 4-dose series)    Colorectal Cancer Screen     DTaP/Tdap/Td vaccine (3 - Td or Tdap)    AAA screen     Annual Wellness Visit (AWV)     Flu vaccine (1)       Health Maintenance reviewed and discussed per provider  Please order/place referral if appropriate.    1. For patients aged 28-75: Has the patient had a colonoscopy? no  If the patient is male:    2. For patients aged 27-74: Has the patient had a mammogram within the past 2 years? N/A    3. For patients aged 21-65: Has the patient had a pap smear? N/A    Advance Directive:  1. Do you have an advance directive in place? Patient Reply:no    2. If not, would you like material regarding how to put one in place? Patient Reply:no    Coordination of Care:  1. Have you been to the ER, urgent care clinic since your last visit?  Hospitalized since your last visit? Patient Reply:no    2. Have you seen or consulted any other health care providers outside of the [place] since your last visit? Include any pap smears or colon. Patient Reply:no    Patient is accompanied by  I have received verbal consent from Hunter Higgins to discuss any/all medical information while they are present in the room.    Depression Screening:  PHQ-9  02/17/2022   Little interest or pleasure in doing things 0   Little interest or pleasure in doing things -   Feeling down, depressed, or hopeless 0   PHQ-2 Score 0   Total Score PHQ 2 -   PHQ-9  Total Score 0           Recent Travel Screening and Travel History documentation     Travel Screening     No screening recorded since 03/21/22 0000       Travel History   Travel since 02/19/22    No documented travel since 02/19/22

## 2022-03-22 NOTE — Progress Notes (Signed)
Progress Note    Patient: Hunter Higgins               Sex: male                  Date of Birth:  [birth year]      Age:  71 y.o.                    HPI:     Hunter Higgins is a 71 y.o. male who has been seen for followup of DM , hypertension and mass posterior neck .  Dr Inda Castle has ordered a CT scan  of the mass - probable lipoma . He did not get  a phone call re this he believes .He says he gets a lot of spam calls    He believes his DM  is well controlled .  But he has lost his meter.    Past Medical History:   Diagnosis Date    CAD (coronary artery disease)     Diabetes ([place])     Diabetic foot ulcer ([place])     Left    Dialysis patient ([place])     ESRD (end stage renal disease) ([place])     Foot ulcer ([place])     Hypertension     Retinopathy     Visual impairment        Past Surgical History:   Procedure Laterality Date    DEBRIDEMENT OPEN WOUND 20 SQ CM< Left     foot    PROSTATE SURGERY         No family history on file.      Current Outpatient Medications:     Dulaglutide (TRULICITY) 7.82 UM/3.5TI SOPN, Inject 0.75 mg into the skin every 7 days, Disp: 5 Adjustable Dose Pre-filled Pen Syringe, Rfl: 3    Multiple Vitamins-Minerals (MULTIVITAMIN MEN 50+ PO), Take 1 tablet by mouth daily, Disp: , Rfl:     acetaminophen (TYLENOL) 325 MG tablet, Take by mouth, Disp: , Rfl:     insulin lispro (HUMALOG) 100 UNIT/ML SOLN injection vial, Inject into the skin, Disp: , Rfl:     traZODone (DESYREL) 50 MG tablet, Take 1 tablet by mouth nightly, Disp: 30 tablet, Rfl: 3    aspirin 81 MG EC tablet, Take 1 tablet by mouth daily, Disp: , Rfl:     furosemide (LASIX) 20 MG tablet, Take by mouth daily, Disp: , Rfl:     torsemide (DEMADEX) 100 MG tablet, Take 1 tablet by mouth daily, Disp: , Rfl:     gabapentin (NEURONTIN) 300 MG capsule, Take 1 capsule by mouth 3 times daily for 90 days. Max Daily Amount: 900 mg, Disp: 270 capsule, Rfl: 0     Allergies   Allergen Reactions    Ace Inhibitors Cough, Itching and Other (See Comments)      cough  Other reaction(s): Cough  cough  cough    Naproxen Anaphylaxis, Shortness Of Breath and Swelling     Other reaction(s): gi distress, Other, Other (See Comments)  Throat swells. cannot breathe, and causes GI distress  REACTION: Throat swells and cannot breathe  anaphylaxis  REACTION: Throat swells and cannot breathe  anaphylaxis  Throat swells. cannot breathe, and causes GI distress       Review of Systems   Constitutional:  Positive for chills. Negative for fever.   Respiratory:  Negative for cough and shortness of breath.    Cardiovascular:  Negative for chest pain.  Gastrointestinal: Negative.    Neurological:  Negative for dizziness and syncope.   Psychiatric/Behavioral:  The patient is nervous/anxious.         Physical Exam:      Visit Vitals  BP (!) 179/68 (Site: Left Upper Arm, Position: Sitting)   Pulse 74   Resp 18   Ht 5\' 7"  (1.702 m)   Wt 163 lb (73.9 kg)   SpO2 99%   BMI 25.53 kg/m       Physical Exam  Constitutional:       Appearance: Normal appearance.   HENT:      Ears:      Comments: Rt auditory canal occluded with cerumen . L is clear .   Cardiovascular:      Rate and Rhythm: Normal rate and regular rhythm.      Heart sounds: Normal heart sounds. No murmur heard.    No gallop.   Pulmonary:      Effort: Pulmonary effort is normal.      Breath sounds: Normal breath sounds.   Neurological:      Mental Status: He is alert.        Labs Reviewed:      Assessment/Plan       ICD-10-CM    1. Diabetes mellitus due to underlying condition with hyperosmolarity without nonketotic hyperglycemic-hyperosmolar coma [facility]) ([place])  E08.00       2. Mass in neck  R22.1       3. Chronic kidney disease, stage 4 (severe) (HCC)  N18.4        Given phone number for  CT  scanning . Will get  Rt  ear syringed today .   Reordered glucometer .     Briant Cedar, MD

## 2022-03-24 ENCOUNTER — Encounter

## 2022-03-24 MED ORDER — GLUCOSE MONITORING KIT
PACK | Freq: Every day | 0 refills | Status: DC
Start: 2022-03-24 — End: 2022-09-29

## 2022-04-07 NOTE — Telephone Encounter (Signed)
Pt called in states he left medication in [place] ask for a new prescription    Dulaglutide (TRULICITY) 0.08 QP/6.1PJ SOPN [[id number]]     Order Details  Dose: 0.75 mg Route: SubCUTAneous Frequency: EVERY 7 DAYS   Dispense Quantity: 5 Adjustable Dose Pre-filled Pen Syringe Refills: 3          Sig: Inject 0.75 mg into the skin every 7 days

## 2022-04-12 ENCOUNTER — Ambulatory Visit: Payer: MEDICARE | Attending: Geriatric Medicine | Primary: Geriatric Medicine

## 2022-04-12 NOTE — Telephone Encounter (Signed)
Chart reviewed. The patient has refills left for Trulicity. Pt is encouraged to contact the pharmacy to see if he is eligible to get an earlier refill. Pt is scheduled to be seen in clinic for a follow up today.

## 2022-04-20 ENCOUNTER — Ambulatory Visit: Payer: MEDICARE | Primary: Geriatric Medicine

## 2022-04-20 NOTE — Telephone Encounter (Signed)
Per pharmacist please resend the Rx for the patients glucometer (glucose monitoring (FREESTYLE FREEDOM) kit)  with the Dx code on it for medicare purposes

## 2022-04-21 ENCOUNTER — Encounter

## 2022-04-21 MED ORDER — GLUCOSE MONITORING KIT
PACK | Freq: Every day | 0 refills | Status: DC
Start: 2022-04-21 — End: 2022-09-29

## 2022-04-26 ENCOUNTER — Ambulatory Visit: Payer: MEDICARE | Attending: Surgery | Primary: Geriatric Medicine

## 2022-05-31 ENCOUNTER — Ambulatory Visit: Payer: MEDICARE | Attending: Geriatric Medicine | Primary: Internal Medicine

## 2022-06-07 ENCOUNTER — Ambulatory Visit: Payer: MEDICARE | Attending: Geriatric Medicine | Primary: Internal Medicine

## 2022-06-08 NOTE — Telephone Encounter (Signed)
Unsuccessful attempt to contact Mr. Arcand (voicemail full) to discuss cancellation of CT scan Dr. Bridget Hartshorn ordered/assist with rescheduling procedure to evaluate mass.

## 2022-07-26 ENCOUNTER — Ambulatory Visit: Payer: MEDICARE | Attending: Geriatric Medicine | Primary: Internal Medicine

## 2022-07-26 ENCOUNTER — Ambulatory Visit: Admit: 2022-07-26 | Discharge: 2022-07-26 | Payer: MEDICARE | Attending: Internal Medicine | Primary: Internal Medicine

## 2022-07-26 DIAGNOSIS — Z0000 Encounter for general adult medical examination without abnormal findings: Secondary | ICD-10-CM

## 2022-07-26 LAB — AMB POC HEMOGLOBIN A1C: Hemoglobin A1C, POC: 8.1 %

## 2022-07-26 MED ORDER — GABAPENTIN 300 MG PO CAPS
300 MG | ORAL_CAPSULE | Freq: Two times a day (BID) | ORAL | 1 refills | Status: AC
Start: 2022-07-26 — End: 2023-01-22

## 2022-07-26 MED ORDER — TRULICITY 0.75 MG/0.5ML SC SOPN
0.75 MG/0.5ML | SUBCUTANEOUS | 3 refills | Status: AC
Start: 2022-07-26 — End: 2022-09-29

## 2022-07-26 NOTE — Addendum Note (Signed)
Addended by: Louann Sjogren on: 07/26/2022 12:03 PM     Modules accepted: Orders

## 2022-07-26 NOTE — Progress Notes (Addendum)
Subjective:      Patient ID: Hunter Higgins is a 71 y.o. male.    Patient comes to establish PCP.    Patient asks if I recommend lipoma removal from his back.  He also has a big callus on the left plantar sole.  Denies fever chills sweats chest pain shortness of breath.  He has no complains or concerns.  He forgot his Trulicity in another [place] when traveling on report for refills.      Check an A1c POC.  Medications Trulicity refilled.  Reconciled medication list.  Explained that removal of lipoma is optional since he is not giving him any issues.  Patient will think about having evaluation for lipoma removal but not today.  He will let me know.  Patient will call his podiatrist today for evaluation of the callus of the left foot.    Update: Patient came back to requesting evaluation by specialty for lipoma.  Referral sent to Dr. Bridget Hartshorn.      GERD  famotidine (PEPCID) 20 MG tablet, Take 1 tablet by mouth 2 times daily, Disp: , Rfl:     ondansetron (ZOFRAN-ODT) 4 MG disintegrating tablet, Take 1 tablet by mouth every 8 hours as needed, Disp: , Rfl:     Diabetes mellitus type 2  Dulaglutide (TRULICITY) 9.83 JA/2.5KN SOPN, Inject 0.75 mg into the skin every 7 days, Disp: 5 Adjustable Dose Pre-filled Pen Syringe, Rfl: 3  insulin lispro (HUMALOG) 100 UNIT/ML SOLN injection vial, Inject into the skin (Patient not taking: Reported on 07/26/2022), Disp: , Rfl:     Diabetic neuropathy  gabapentin (NEURONTIN) 300 MG capsule, Take 1 capsule by mouth 3 times daily for 90 days. Max Daily Amount: 900 mg, Disp: 270 capsule, Rfl: 0    ESRD  On dialysis 3 times a day.    OTC  acetaminophen (TYLENOL) 325 MG tablet, Take by mouth (Patient not taking: Reported on 07/26/2022), Disp: , Rfl:   aspirin 81 MG chewable tablet, Take 1 tablet by mouth daily, Disp: , Rfl:     traZODone (DESYREL) 50 MG tablet, Take 1 tablet by mouth nightly (Patient not taking: Reported on 07/26/2022), Disp: 30 tablet, Rfl: 3  furosemide (LASIX) 20 MG tablet,  Take by mouth daily (Patient not taking: Reported on 07/26/2022), Disp: , Rfl:   torsemide (DEMADEX) 100 MG tablet, Take 1 tablet by mouth daily (Patient not taking: Reported on 07/26/2022), Disp: , Rfl:                 Review of Systems   Constitutional:  Negative for chills and fever.   Respiratory:  Negative for shortness of breath.    Cardiovascular:  Negative for chest pain.   Gastrointestinal:  Negative for abdominal pain.       Objective:   Visit Vitals  BP 134/60 (Site: Left Upper Arm, Position: Sitting, Cuff Size: Medium Adult)   Pulse 69   Temp 97 F (36.1 C) (Temporal)   Resp 16   Ht 1.702 m (5\' 7" )   Wt 79.5 kg (175 lb 3.2 oz)   SpO2 99%   BMI 27.44 kg/m        Physical Exam  Cardiovascular:      Rate and Rhythm: Normal rate and regular rhythm.   Pulmonary:      Effort: Pulmonary effort is normal.      Breath sounds: Normal breath sounds.   Abdominal:      General: Abdomen is flat. Bowel sounds are normal.  Musculoskeletal:      Cervical back: Normal range of motion.        Feet:    Feet:      Comments: 2 x 2 inch callous, no tenderness, no secretions, no skin breaks, no swelling.  Neurological:      Mental Status: He is alert.         Assessment:       ICD-10-CM    1. Medicare annual wellness visit, subsequent  Z00.00       2. ACP (advance care planning)  Z71.89 PR Advanced Care Planning (16-30 minutes) [[id number]]      3. Diabetes mellitus due to underlying condition with hyperosmolarity without nonketotic hyperglycemic-hyperosmolar coma [facility]) (HCC)  E08.00 Dulaglutide (TRULICITY) 1.61 WR/6.0AV SOPN     AMB POC HEMOGLOBIN A1C      4. Other chronic pain  G89.29 gabapentin (NEURONTIN) 300 MG capsule      5. Fall, initial encounter  W19.XXXA [place] [facility] [place]:   Return in about 3 months (around 10/25/2022) for Treatment monitoring.      Louann Sjogren, MD

## 2022-07-26 NOTE — Patient Instructions (Signed)
Preventing Falls: Care Instructions  Injuries and health problems such as trouble walking or poor eyesight can increase your risk of falling. So can some medicines. But there are things you can do to help prevent falls. You can exercise to get stronger. You can also arrange your home to make it safer.    Talk to your doctor about the medicines you take. Ask if any of them increase the risk of falls and whether they can be changed or stopped.   Try to exercise regularly. It can help improve your strength and balance. This can help lower your risk of falling.     Practice fall safety and prevention.    Wear low-heeled shoes that fit well and give your feet good support. Talk to your doctor if you have foot problems that make this hard.  Carry a cellphone or wear a medical alert device that you can use to call for help.  Use stepladders instead of chairs to reach high objects. Don't climb if you're at risk for falls. Ask for help, if needed.  Wear the correct eyeglasses, if you need them.    Make your home safer.    Remove rugs, cords, clutter, and furniture from walkways.  Keep your house well lit. Use night-lights in hallways and bathrooms.  Install and use sturdy handrails on stairways.  Wear nonskid footwear, even inside. Don't walk barefoot or in socks without shoes.    Be safe outside.    Use handrails, curb cuts, and ramps whenever possible.  Keep your hands free by using a shoulder bag or backpack.  Try to walk in well-lit areas. Watch out for uneven ground, changes in pavement, and debris.  Be careful in the winter. Walk on the grass or gravel when sidewalks are slippery. Use de-icer on steps and walkways. Add non-slip devices to shoes.    Put grab bars and nonskid mats in your shower or tub and near the toilet. Try to use a shower chair or bath bench when bathing.   Get into a tub or shower by putting in your weaker leg first. Get out with your strong side first. Have a phone or medical alert device in  the bathroom with you.   Where can you learn more?  Go to https://www.bennett.info/ and enter G117 to learn more about "Preventing Falls: Care Instructions."  Current as of: July 18, 2023Content Version: 13.9   2006-2023 Healthwise, Incorporated.   Care instructions adapted under license by [facility]. If you have questions about a medical condition or this instruction, always ask your healthcare professional. [place] any warranty or liability for your use of this information.      For more information on your local Area Agency on Aging or Council on Aging please visit the appropriate web site below:    [place]: [email or web address]    [place]: [email or web address]://[email or web address]    [place]: [email or web address]-[email or web address]    [place]: [email or web address]://ward.info/           Learning About Being Active as an Older Adult  Why is being active important as you get older?     Being active is one of the best things you can do for your health. And it's never too late to start. Being active--or getting active, if you aren't already--has definite benefits. It can:  Give you more energy,  Keep your mind sharp.  Improve balance to reduce your risk of falls.  Help you manage chronic illness with fewer medicines.  No matter how  old you are, how fit you are, or what health problems you have, there is a form of activity that will work for you. And the more physical activity you can do, the better your overall health will be.  What kinds of activity can help you stay healthy?  Being more active will make your daily activities easier. Physical activity includes planned exercise and things you do in daily life. There are four types of activity:  Aerobic.  Doing aerobic activity makes your heart and lungs strong.  Includes walking, dancing, and gardening.  Aim for at least 2 hours spread throughout the week.  It improves your energy and can help you sleep  better.  Muscle-strengthening.  This type of activity can help maintain muscle and strengthen bones.  Includes climbing stairs, using resistance bands, and lifting or carrying heavy loads.  Aim for at least twice a week.  It can help protect the knees and other joints.  Stretching.  Stretching gives you better range of motion in joints and muscles.  Includes upper arm stretches, calf stretches, and gentle yoga.  Aim for at least twice a week, preferably after your muscles are warmed up from other activities.  It can help you function better in daily life.  Balancing.  This helps you stay coordinated and have good posture.  Includes heel-to-toe walking, tai chi, and certain types of yoga.  Aim for at least 3 days a week.  It can reduce your risk of falling.  Even if you have a hard time meeting the recommendations, it's better to be more active than less active. All activity done in each category counts toward your weekly total. You'd be surprised how daily things like carrying groceries, keeping up with grandchildren, and taking the stairs can add up.  What keeps you from being active?  If you've had a hard time being more active, you're not alone. Maybe you remember being able to do more. Or maybe you've never thought of yourself as being active. It's frustrating when you can't do the things you want. Being more active can help. What's holding you back?  Getting started.  Have a goal, but break it into easy tasks. Small steps build into big accomplishments.  Staying motivated.  If you feel like skipping your activity, remember your goal. Maybe you want to move better and stay independent. Every activity gets you one step closer.  Not feeling your best.  Start with 5 minutes of an activity you enjoy. Prove to yourself you can do it. As you get comfortable, increase your time.  You may not be where you want to be. But you're in the process of getting there. Everyone starts somewhere.  How can you find safe ways to  stay active?  Talk with your doctor about any physical challenges you're facing. Make a plan with your doctor if you have a health problem or aren't sure how to get started with activity.  If you're already active, ask your doctor if there is anything you should change to stay safe as your body and health change.  If you tend to feel dizzy after you take medicine, avoid activity at that time. Try being active before you take your medicine. This will reduce your risk of falls.  If you plan to be active at home, make sure to clear your space before you get started. Remove things like TV cords, coffee tables, and throw rugs. It's safest to have plenty of space to move freely.  The key to getting more active is to take it slow and steady. Try to improve only a little bit at a time. Pick just one area to improve on at first. And if an activity hurts, stop and talk to your doctor.  Where can you learn more?  Go to https://www.bennett.info/ and enter P600 to learn more about "Learning About Being Active as an Older Adult."  Current as of: June 6, 2023Content Version: 13.9   2006-2023 Healthwise, Incorporated.   Care instructions adapted under license by [facility]. If you have questions about a medical condition or this instruction, always ask your healthcare professional. [place] any warranty or liability for your use of this information.           Learning About Dental Care for Older Adults  Dental care for older adults: Overview  Dental care for older people is much the same as for younger adults. But older adults do have concerns that younger adults do not. Older adults may have problems with gum disease and decay on the roots of their teeth. They may need missing teeth replaced or broken fillings fixed. Or they may have dentures that need to be cared for. Some older adults may have trouble holding a toothbrush.  You can help remind the person you are caring for  to brush and floss their teeth or to clean their dentures. In some cases, you may need to do the brushing and other dental care tasks. People who have trouble using their hands or who have dementia may need this extra help.  How can you help with dental care?  Normal dental care  To keep the teeth and gums healthy:  Brush the teeth with fluoride toothpaste twice a day--in the morning and at night--and floss at least once a day. Plaque can quickly build up on the teeth of older adults.  Watch for the signs of gum disease. These signs include gums that bleed after brushing or after eating hard foods, such as apples.  See a dentist regularly. Many experts recommend checkups every 6 months.  Keep the dentist up to date on any new medications the person is taking.  Encourage a balanced diet that includes whole grains, vegetables, and fruits, and that is low in saturated fat and sodium.  Encourage the person you're caring for not to use tobacco products. They can affect dental and general health.  Many older adults have a fixed income and feel that they can't afford dental care. But most towns and cities have programs in which dentists help older adults by lowering fees. Contact your area's public health offices or social services for information about dental care in your area.  Using a toothbrush  Older adults with arthritis sometimes have trouble brushing their teeth because they can't easily hold the toothbrush. Their hands and fingers may be stiff, painful, or weak. If this is the case, you can:  Offer an IT trainer toothbrush.  Enlarge the handle of a non-electric toothbrush by wrapping a sponge, an elastic bandage, or adhesive tape around it.  Push the toothbrush handle through a ball made of rubber or soft foam.  Make the handle longer and thicker by taping Popsicle sticks or tongue depressors to it.  You may also be able to buy special toothbrushes, toothpaste dispensers, and floss holders.  Your doctor may  recommend a soft-bristle toothbrush if the person you care for bleeds easily. Bleeding can happen because of a health problem or from certain medicines.  A toothpaste for sensitive teeth may help if the person you care for has sensitive teeth.  How do you brush and floss someone's teeth?  If the person you are caring for has a hard time cleaning their teeth on their own, you may need to brush and floss their teeth for them. It may be easiest to have the person sit and face away from you, and to sit or stand behind them. That way you can steady their head against your arm as you reach around to floss and brush their teeth. Choose a place that has good lighting and is comfortable for both of you.  Before you begin, gather your supplies. You will need gloves, floss, a toothbrush, and a container to hold water if you are not near a sink. Wash and dry your hands well and put on gloves. Start by flossing:  Gently work a piece of floss between each of the teeth toward the gums. A plastic flossing tool may make this easier, and they are available at most drugstores.  Curve the floss around each tooth into a U-shape and gently slide it under the gum line.  Move the floss firmly up and down several times to scrape off the plaque.  After you've finished flossing, throw away the used floss and begin brushing:  Wet the brush and apply toothpaste.  Place the brush at a 45-degree angle where the teeth meet the gums. Press firmly, and move the brush in small circles over the surface of the teeth.  Be careful not to brush too hard. Vigorous brushing can make the gums pull away from the teeth and can scratch the tooth enamel.  Brush all surfaces of the teeth, on the tongue side and on the cheek side. Pay special attention to the front teeth and all surfaces of the back teeth.  Brush chewing surfaces with short back-and-forth strokes.  After you've finished, help the person rinse the remaining toothpaste from their mouth.  Where can  you learn more?  Go to https://www.bennett.info/ and enter F944 to learn more about "[place] for Older Adults."  Current as of: August 6, 2023Content Version: 13.9   2006-2023 Healthwise, Incorporated.   Care instructions adapted under license by [facility]. If you have questions about a medical condition or this instruction, always ask your healthcare professional. [place] any warranty or liability for your use of this information.           Hearing Loss: Care Instructions  Overview     Hearing loss is a sudden or slow decrease in how well you hear. It can range from slight to profound. Permanent hearing loss can occur with aging. It also can happen when you are exposed long-term to loud noise. Examples include listening to loud music, riding motorcycles, or being around other loud machines.  Hearing loss can affect your work and home life. It can make you feel lonely or depressed. You may feel that you have lost your independence. But hearing aids and other devices can help you hear better and feel connected to others.  Follow-up care is a key part of your treatment and safety. Be sure to make and go to all appointments, and call your doctor if you are having problems. It's also a good idea to know your test results and keep a list of the medicines you take.  How can you care for yourself at home?  Avoid loud noises whenever possible. This helps keep your  hearing from getting worse.  Always wear hearing protection around loud noises.  Wear a hearing aid as directed.  A professional can help you pick a hearing aid that will work best for you.  You can also get hearing aids over the counter for mild to moderate hearing loss.  Have hearing tests as your doctor suggests. They can show whether your hearing has changed. Your hearing aid may need to be adjusted.  Use other devices as needed. These may include:  Telephone amplifiers and hearing  aids that can connect to a television, stereo, radio, or microphone.  Devices that use lights or vibrations. These alert you to the doorbell, a ringing telephone, or a baby monitor.  Television closed-captioning. This shows the words at the bottom of the screen. Most new TVs can do this.  TTY (text telephone). This lets you type messages back and forth on the telephone instead of talking or listening. These devices are also called TDD. When messages are typed on the keyboard, they are sent over the phone line to a receiving TTY. The message is shown on a monitor.  Use text messaging, social media, and email if it is hard for you to communicate by telephone.  Try to learn a listening technique called speechreading. It is not lipreading. You pay attention to people's gestures, expressions, posture, and tone of voice. These clues can help you understand what a person is saying. Face the person you are talking to, and have them face you. Make sure the lighting is good. You need to see the other person's face clearly.  Think about counseling if you need help to adjust to your hearing loss.  When should you call for help?  Watch closely for changes in your health, and be sure to contact your doctor if:   You think your hearing is getting worse.    You have new symptoms, such as dizziness or nausea.   Where can you learn more?  Go to https://www.bennett.info/ and enter R798 to learn more about "Hearing Loss: Care Instructions."  Current as of: February 28, 2023Content Version: 13.9   2006-2023 Healthwise, Incorporated.   Care instructions adapted under license by [facility]. If you have questions about a medical condition or this instruction, always ask your healthcare professional. [place] any warranty or liability for your use of this information.           Learning About Vision Tests  What are vision tests?     The four most common vision tests are visual acuity  tests, refraction, visual field tests, and color vision tests.  Visual acuity (sharpness) tests  These tests are used:  To see if you need glasses or contact lenses.  To monitor an eye problem.  To check an eye injury.  Visual acuity tests are done as part of routine exams. You may also have this test when you get your driver's license or apply for some types of jobs.  Visual field tests  These tests are used:  To check for vision loss in any area of your range of vision.  To screen for certain eye diseases.  To look for nerve damage after a stroke, head injury, or other problem that could reduce blood flow to the brain.  Refraction and color tests  A refraction test is done to find the right prescription for glasses and contact lenses.  A color vision test is done to check for color blindness.  Color vision is often tested  as part of a routine exam. You may also have this test when you apply for a job where recognizing different colors is important, such as truck driving, Research officer, trade union, or the [employer].  How are vision tests done?  Visual acuity test   You cover one eye at a time.  You read aloud from a wall chart across the room.  You read aloud from a small card that you hold in your hand.  Refraction   You look into a special device.  The device puts lenses of different strengths in front of each eye to see how strong your glasses or contact lenses need to be.  Visual field tests   Your doctor may have you look through special machines.  Or your doctor may simply have you stare straight ahead while they move a finger into and out of your field of vision.  Color vision test   You look at pieces of printed test patterns in various colors. You say what number or symbol you see.  Your doctor may have you trace the number or symbol using a pointer.  How do these tests feel?  There is very little chance of having a problem from this test. If dilating drops are used for a vision test, they may make the eyes sting and  cause a medicine taste in the mouth.  Follow-up care is a key part of your treatment and safety. Be sure to make and go to all appointments, and call your doctor if you are having problems. It's also a good idea to know your test results and keep a list of the medicines you take.  Where can you learn more?  Go to https://www.bennett.info/ and enter G551 to learn more about "Learning About Vision Tests."  Current as of: June 6, 2023Content Version: 13.9   2006-2023 Healthwise, Incorporated.   Care instructions adapted under license by [facility]. If you have questions about a medical condition or this instruction, always ask your healthcare professional. [place] any warranty or liability for your use of this information.           Learning About Activities of Daily Living  What are activities of daily living?     Activities of daily living (ADLs) are the basic self-care tasks you do every day. These include eating, bathing, dressing, and moving around.  As you age, and if you have health problems, you may find that it's harder to do some of these tasks. If so, your doctor can suggest ideas that may help.  To measure what kind of help you may need, your doctor will ask how well you are able to do ADLs. Let your doctor know if there are any tasks that you are having trouble doing. This is an important first step to getting help. And when you have the help you need, you can stay as independent as possible.  How will a doctor assess your ADLs?  Asking about ADLs is part of a routine health checkup your doctor will likely do as you age. Your health check might be done in a doctor's office, in your home, or at a hospital. The goal is to find out if you are having any problems that could make it hard to care for yourself or that make it unsafe for you to be on your own.  To measure your ADLs, your doctor will ask how hard it is for you to do routine tasks. Your doctor  may also want to  know if you have changed the way you do a task because of a health problem. Your doctor may watch how you:  Walk back and forth.  Keep your balance while you stand or walk.  Move from sitting to standing or from a bed to a chair.  Button or unbutton a Printmaker.  Remove and put on your shoes.  It's common to feel a little worried or anxious if you find you can't do all the things you used to be able to do. Talking with your doctor about ADLs is a way to make sure you're as safe as possible and able to care for yourself as well as you can. You may want to bring a caregiver, friend, or family member to your checkup. They can help you talk to your doctor.  Follow-up care is a key part of your treatment and safety. Be sure to make and go to all appointments, and call your doctor if you are having problems. It's also a good idea to know your test results and keep a list of the medicines you take.  Current as of: February 26, 2023Content Version: 13.9   2006-2023 Healthwise, Incorporated.   Care instructions adapted under license by [facility]. If you have questions about a medical condition or this instruction, always ask your healthcare professional. [place] any warranty or liability for your use of this information.           Advance Directives: Care Instructions  Overview  An advance directive is a legal way to state your wishes at the end of your life. It tells your family and your doctor what to do if you can't say what you want.  There are two main types of advance directives. You can change them any time your wishes change.  Living will.  This form tells your family and your doctor your wishes about life support and other treatment. The form is also called a declaration.  Medical power of attorney.  This form lets you name a person to make treatment decisions for you when you can't speak for yourself. This person is called a health care agent  (health care proxy, health care surrogate). The form is also called a durable power of attorney for health care.  If you do not have an advance directive, decisions about your medical care may be made by a family member, or by a doctor or a judge who doesn't know you.  It may help to think of an advance directive as a gift to the people who care for you. If you have one, they won't have to make tough decisions by themselves.  For more information, including forms for your state, see the [place] website (RebankingSpace.hu).  Follow-up care is a key part of your treatment and safety. Be sure to make and go to all appointments, and call your doctor if you are having problems. It's also a good idea to know your test results and keep a list of the medicines you take.  What should you include in an advance directive?  Many states have a unique advance directive form. (It may ask you to address specific issues.) Or you might use a universal form that's approved by many states.  If your form doesn't tell you what to address, it may be hard to know what to include in your advance directive. Use the questions below to help you get started.  Who do you want to make decisions about your medical  care if you are not able to?  What life-support measures do you want if you have a serious illness that gets worse over time or can't be cured?  What are you most afraid of that might happen? (Maybe you're afraid of having pain, losing your independence, or being kept alive by machines.)  Where would you prefer to die? (Your home? A hospital? A nursing home?)  Do you want to donate your organs when you die?  Do you want certain religious practices performed before you die?  When should you call for help?  Be sure to contact your doctor if you have any questions.  Where can you learn more?  Go to https://www.bennett.info/ and enter R264 to learn more about "Advance Directives: Care  Instructions."  Current as of: March 26, 2023Content Version: 13.9   2006-2023 Healthwise, Incorporated.   Care instructions adapted under license by [facility]. If you have questions about a medical condition or this instruction, always ask your healthcare professional. [place] any warranty or liability for your use of this information.           A Healthy Heart: Care Instructions  Your Care Instructions     Coronary artery disease, also called heart disease, occurs when a substance called plaque builds up in the vessels that supply oxygen-rich blood to your heart muscle. This can narrow the blood vessels and reduce blood flow. A heart attack happens when blood flow is completely blocked. A high-fat diet, smoking, and other factors increase the risk of heart disease.  Your doctor has found that you have a chance of having heart disease. You can do lots of things to keep your heart healthy. It may not be easy, but you can change your diet, exercise more, and quit smoking. These steps really work to lower your chance of heart disease.  Follow-up care is a key part of your treatment and safety. Be sure to make and go to all appointments, and call your doctor if you are having problems. It's also a good idea to know your test results and keep a list of the medicines you take.  How can you care for yourself at home?  Diet   Use less salt when you cook and eat. This helps lower your blood pressure. Taste food before salting. Add only a little salt when you think you need it. With time, your taste buds will adjust to less salt.    Eat fewer snack items, fast foods, canned soups, and other high-salt, high-fat, processed foods.    Read food labels and try to avoid saturated and trans fats. They increase your risk of heart disease by raising cholesterol levels.    Limit the amount of solid fat-butter, margarine, and shortening-you eat. Use olive, peanut, or canola oil when you  cook. Bake, broil, and steam foods instead of frying them.    Eat a variety of fruit and vegetables every day. Dark green, deep orange, red, or yellow fruits and vegetables are especially good for you. Examples include spinach, carrots, peaches, and berries.    Foods high in fiber can reduce your cholesterol and provide important vitamins and minerals. High-fiber foods include whole-grain cereals and breads, oatmeal, beans, brown rice, citrus fruits, and apples.    Eat lean proteins. Heart-healthy proteins include seafood, lean meats and poultry, eggs, beans, peas, nuts, seeds, and soy products.    Limit drinks and foods with added sugar. These include candy, desserts, and soda pop.  Lifestyle changes   If your doctor recommends it, get more exercise. Walking is a good choice. Bit by bit, increase the amount you walk every day. Try for at least 30 minutes on most days of the week. You also may want to swim, bike, or do other activities.    Do not smoke. If you need help quitting, talk to your doctor about stop-smoking programs and medicines. These can increase your chances of quitting for good. Quitting smoking may be the most important step you can take to protect your heart. It is never too late to quit.    Limit alcohol to 2 drinks a day for men and 1 drink a day for women. Too much alcohol can cause health problems.    Manage other health problems such as diabetes, high blood pressure, and high cholesterol. If you think you may have a problem with alcohol or drug use, talk to your doctor.   Medicines   Take your medicines exactly as prescribed. Call your doctor if you think you are having a problem with your medicine.    If your doctor recommends aspirin, take the amount directed each day. Make sure you take aspirin and not another kind of pain reliever, such as acetaminophen (Tylenol).   When should you call for help?   Call 911 if you have symptoms of a heart attack. These may include:   Chest pain  or pressure, or a strange feeling in the chest.    Sweating.    Shortness of breath.    Pain, pressure, or a strange feeling in the back, neck, jaw, or upper belly or in one or both shoulders or arms.    Lightheadedness or sudden weakness.    A fast or irregular heartbeat.   After you call 911, the operator may tell you to chew 1 adult-strength or 2 to 4 low-dose aspirin. Wait for an ambulance. Do not try to drive yourself.  Watch closely for changes in your health, and be sure to contact your doctor if you have any problems.  Where can you learn more?  Go to https://www.bennett.info/ and enter F075 to learn more about "A Healthy Heart: Care Instructions."  Current as of: June 25, 2023Content Version: 13.9   2006-2023 [employer].   Care instructions adapted under license by [facility]. If you have questions about a medical condition or this instruction, always ask your healthcare professional. [name] any warranty or liability for your use of this information.      Personalized Preventive Plan for Hunter Higgins - 07/26/2022  Medicare offers a range of preventive health benefits. Some of the tests and screenings are paid in full while other may be subject to a deductible, co-insurance, and/or copay.    Some of these benefits include a comprehensive review of your medical history including lifestyle, illnesses that may run in your family, and various assessments and screenings as appropriate.    After reviewing your medical record and screening and assessments performed today your provider may have ordered immunizations, labs, imaging, and/or referrals for you.  A list of these orders (if applicable) as well as your Preventive Care list are included within your After Visit Summary for your review.    Other Preventive Recommendations:    A preventive eye exam performed by an eye specialist is recommended every 1-2 years to screen for glaucoma;  cataracts, macular degeneration, and other eye disorders.  A preventive dental visit is recommended every 6 months.  Try  to get at least 150 minutes of exercise per week or 10,000 steps per day on a pedometer .  Order or download the FREE "Exercise & Physical Activity: Your Everyday Guide" from [employer]. Call 715-778-0350 or search [employer] online.  You need 1200-1500 mg of calcium and 1000-2000 IU of vitamin D per day. It is possible to meet your calcium requirement with diet alone, but a vitamin D supplement is usually necessary to meet this goal.  When exposed to the sun, use a sunscreen that protects against both UVA and UVB radiation with an SPF of 30 or greater. Reapply every 2 to 3 hours or after sweating, drying off with a towel, or swimming.  Always wear a seat belt when traveling in a car. Always wear a helmet when riding a bicycle or motorcycle.

## 2022-07-26 NOTE — Progress Notes (Signed)
Medicare Annual Wellness Visit    Hunter Higgins is here for Medicare AWV    Assessment & Plan   Medicare annual wellness visit, subsequent  ACP (advance care planning)  -     PR Advanced Care Planning (16-30 minutes) [33295]  Recommendations for Preventive Services Due: see orders and patient instructions/AVS.  Recommended screening schedule for the next 5-10 years is provided to the patient in written form: see Patient Instructions/AVS.     No follow-ups on file.     Subjective       Patient's complete Health Risk Assessment and screening values have been reviewed and are found in Flowsheets. The following problems were reviewed today and where indicated follow up appointments were made and/or referrals ordered.    Positive Risk Factor Screenings with Interventions:    Fall Risk:  Do you feel unsteady or are you worried about falling? : (!) yes  2 or more falls in past year?: (!) yes  Fall with injury in past year?: (!) yes (Patient he hurt his head and back during his fall in november. He went on to his patio and he missed a step.)     Interventions:    See AVS for additional education material            General HRA Questions:  Select all that apply: (!) New or Increased Pain    Pain Interventions:  See AVS for additional education material      Social and Emotional Support:  Do you get the social and emotional support that you need?: (!) No    Interventions:  See AVS for additional education material    Weight and Activity:  Physical Activity: Inactive (07/26/2022)    Exercise Vital Sign     Days of Exercise per Week: 0 days     Minutes of Exercise per Session: 0 min     On average, how many days per week do you engage in moderate to strenuous exercise (like a brisk walk)?: 0 days  Have you lost any weight without trying in the past 3 months?: No  Body mass index is 27.44 kg/m.      Inactivity Interventions:  See AVS for additional education material      Dentist Screen:  Have you seen the dentist within the  past year?: (!) No    Intervention:  See AVS for additional education material    Hearing Screen:  Do you or your family notice any trouble with your hearing that hasn't been managed with hearing aids?: (!) Yes    Interventions:  See AVS for additional education material    Vision Screen:  Do you have difficulty driving, watching TV, or doing any of your daily activities because of your eyesight?: (!) Yes  Have you had an eye exam within the past year?: (!) No  No results found.    Interventions:   See AVS for additional education material    Safety:  Do you have any tripping hazards - clutter in doorways, halls, or stairs?: (!) Yes  Interventions:  See AVS for additional education material    ADL's:   Patient reports needing help with:  Select all that apply: (!) Eating  Select all that apply: [employer], Food Preparation  Interventions:  See AVS for additional education material    Advanced Directives:  Do you have a Living Will?: (!) No    Intervention:  has NO advanced directive - information provided    Advance Care  Planning   Advanced Care Planning: Discussed the patient's choices for care and treatment in case of a health event that adversely affects decision-making abilities. Also discussed the patient's long-term treatment options. Reviewed with the patient the appropriate state-specific advance directive documents. Reviewed the process of designating a competent adult as an Agent (or Attorney-in-fact) that could take make health care decisions for the patient if incompetent. Patient was asked to complete the declaration forms, if they have not already, either acknowledge the forms by a public notary or an eligible witness and provide a signed copy to the practice office.  Time spent (minutes): 16                      Objective   Vitals:    07/26/22 1012   BP: 134/60   Site: Left Upper Arm   Position: Sitting   Cuff Size: Medium Adult   Pulse: 69   Resp: 16   Temp: 97 F (36.1 C)   TempSrc:  Temporal   SpO2: 99%   Weight: 79.5 kg (175 lb 3.2 oz)   Height: 1.702 m (_0 )      Body mass index is 27.44 kg/m.               Allergies   Allergen Reactions    Ace Inhibitors Cough, Itching and Other (See Comments)     cough  Other reaction(s): Cough  cough  cough    Naproxen Anaphylaxis, Shortness Of Breath and Swelling     Other reaction(s): gi distress, Other, Other (See Comments)  Throat swells. cannot breathe, and causes GI distress  REACTION: Throat swells and cannot breathe  anaphylaxis  REACTION: Throat swells and cannot breathe  anaphylaxis  Throat swells. cannot breathe, and causes GI distress     Prior to Visit Medications    Medication Sig Taking? Authorizing Provider   carbamide peroxide (DEBROX) 6.5 % otic solution Place 5 drops into both ears 2 times daily Yes [provider]   famotidine (PEPCID) 20 MG tablet Take 1 tablet by mouth 2 times daily Yes [provider]   ondansetron (ZOFRAN-ODT) 4 MG disintegrating tablet Take 1 tablet by mouth every 8 hours as needed Yes [provider]   aspirin 81 MG chewable tablet Take 1 tablet by mouth daily Yes [provider]   glucose monitoring kit 1 kit by Does not apply route daily Yes Huston Foley, MD   glucose monitoring (FREESTYLE FREEDOM) kit 1 kit by Does not apply route daily Yes Huston Foley, MD   Dulaglutide (TRULICITY) 4.78 GN/5.6OZ SOPN Inject 0.75 mg into the skin every 7 days Yes Huston Foley, MD   Multiple Vitamins-Minerals (MULTIVITAMIN MEN 50+ PO) Take 1 tablet by mouth daily  Patient not taking: Reported on 07/26/2022  [provider]   acetaminophen (TYLENOL) 325 MG tablet Take by mouth  Patient not taking: Reported on 07/26/2022  [provider]   insulin lispro (HUMALOG) 100 UNIT/ML SOLN injection vial Inject into the skin  Patient not taking: Reported on 07/26/2022  [provider]   gabapentin (NEURONTIN) 300 MG capsule Take 1 capsule by mouth 3 times daily for  90 days. Max Daily Amount: 900 mg  Huston Foley, MD   traZODone (DESYREL) 50 MG tablet Take 1 tablet by mouth nightly  Patient not taking: Reported on 07/26/2022  Huston Foley, MD   aspirin 81 MG EC tablet Take 1 tablet by mouth  daily  Patient not taking: Reported on 07/26/2022  Automatic Reconciliation, Ar   furosemide (LASIX) 20 MG tablet Take by mouth daily  Patient not taking: Reported on 07/26/2022  Automatic Reconciliation, Ar   torsemide (DEMADEX) 100 MG tablet Take 1 tablet by mouth daily  Patient not taking: Reported on 07/26/2022  Automatic Reconciliation, Ar       CareTeam (Including outside providers/suppliers regularly involved in providing care):   Patient Care Team:  Jones Skene, Junious Silk, MD as PCP - General (Internal Medicine)  Huston Foley, MD as PCP - Empaneled Provider  Griffin Basil, DPM as Physician  Benjie Karvonen, MD as Consulting Physician     Reviewed and updated this visit:  Tobacco  Allergies  Meds  Problems  Med Hx  Surg Hx  Soc Hx  Fam Hx

## 2022-08-09 ENCOUNTER — Inpatient Hospital Stay: Admit: 2022-08-09 | Payer: MEDICARE | Primary: Internal Medicine

## 2022-08-09 DIAGNOSIS — R2689 Other abnormalities of gait and mobility: Secondary | ICD-10-CM

## 2022-08-09 NOTE — Progress Notes (Signed)
[facility]  [place] [address] Phone: [phone] Fax [phone]  Plan of Care / Statement of Necessity for Physical Therapy Services     Patient Name: Hunter Higgins DOB: 1950/12/04   Medical   Diagnosis: Gait instability [R26.81] Treatment Diagnosis: M54.59, G89.29  CHRONIC LOWER BACK PAIN and R26.89  Abnormalities of gait and mobility and R26.81  Unsteadiness on feet      Onset Date: 08/09/2022 eval date  1 year ago onset of unsteadiness Payor :  Payor: MEDICARE / Plan: MEDICARE PART A AND B / Product Type: *No Product type* /    Referral Source: Louann Sjogren Start of Care [facility]): 08/09/2022   Prior Hospitalization: See medical history Provider #: [phone]   Prior Level of Function: Ambulatory without AD, independent with ADLs    Comorbidities: Hx of low back pain; visually impaired, peripheral neuropathy, diabetes, hx of frequent falls      Assessment / key information:  Patient is a 71 year old male presenting to PT with complaints of impaired balance and low back pain. Patient reports unsteadiness started about a year ago after developing ulcer on heel requiring him wearing a boot for about 12 months. Reports back pain started to worsen a few months ago especially after falls. Self reported functional deficits include walking, transferring, bending, stooping, & prolonged positioning. PT evaluation reveals decreased LE strength, impaired static/dynamic balance, gait abnormalities, and postural deficits. Objective measuring during evaluation limited due to distractibility and safety concerns in standing. Patient became agitated with therapist when provided with physical assistance to recover balance when walking from waiting room to clinic. Despite education on safety concerns, patient continued to request not to be assisted therefore PT provided thorough education on what therapy entails and how patient will be physically  assisted or to utilize AD. Follow up sessions  will include further objective balance measurements as able.    Evaluation Complexity:  History:  HIGH Complexity :3+ comorbidities / personal factors will impact the outcome/ POC ; Examination:  HIGH Complexity : 4+ Standardized tests and measures addressing body structure, function, activity limitation and / or participation in recreation  ;Presentation:  HIGH Complexity : Unstable and unpredictable characteristics  ;Clinical Decision Making:  MEDIUM Complexity : FOTO score of 26-74 FOTO score = an established functional score where 100 = no disability  Overall Complexity Rating: MEDIUM  Problem List: pain affecting function, decrease ROM, decrease strength, impaired gait/balance, decrease ADL/functional abilities, decrease activity tolerance, decrease flexibility/joint mobility, and decrease transfer abilities    Treatment Plan may include any combination of the following: 97110 Therapeutic Exercise, 97112 Neuromuscular Re-Education, 97140 Manual Therapy, 97530 Therapeutic Activity, 97535 Self Care/Home Management, 97014 Electrical Stim unattended, and 97116 Gait Training If patient is receiving VASO: Vasopnuematic compression justification:  Per bilateral girth measures taken and listed above the edema is considered significant and having an impact on the patient's strength, balance, gait, transfers, self care, and ADL's  Patient / Family readiness to learn indicated by: asking questions  Persons(s) to be included in education: patient (P)  Barriers to Learning/Limitations: emotional (agitated/upset when explaining balance concerns when not using an AD and PT needing to provide physical assist for safety)  Measures taken if barriers to learning present: thorough education/reassurance   Patient Goal (s): improve balance, decrease fall risk, be able to bend   Patient Self Reported Health Status: poor  Rehabilitation Potential: fair  Short Term Goals: To be  accomplished in 2-4 weeks   Patient to be independent with safety strategies when walking and with transfers (I.e. utilization of AD for gait, keeping phone with him)   Status at IE reviewed  2.  Patient will be able to perform sit to stand on first attempt with BUE assist and proper eccentric control with no LOB upon standing.  Status at IE: unsteadiness upon standing, form deficits  3. Patient to improve static balance as shown by ability to maintain static stance for at least 45 seconds with minimal sway and LRAD.  Status at IE: CGA due to unsteadiness     Long Term Goals: To be accomplished in 8-12 weeks from initial evaluation   Patient will be independent with progressive HEP to facilitate/maintain gains made in PT upon discharge   Status at IE: will establish initial HEP at first follow up session   2.  Patient will be able to ambulate at least 10 minutes with LRAD and no LOB to indicate improved mobility  Status at IE: unable   3.  Patient will be able to maintain tandem balance bilaterally, with eyes open, on firm surface for at least 15 seconds to indicate improved stability in narrow BOS   Status at IE: unable   4.  Patient will restore trunk flexion range of motion as shown by finger tips to mid shin with no more than 3/10 pain rating and without AD for functional carryover.  Status at IE: unable     Frequency / Duration: Patient would benefit from skilled PT 2 times per week for up to 8-12 weeks as needed in this certification period.  Goals will be assigned and reassessed every 10 visits/ 30 days per guidelines .  Patient/ Caregiver education and instruction: Diagnosis, prognosis, self care, activity modification, and other home safety, ambulating with AD   [x]   Plan of care has been reviewed with PTA      Renea Ee, PT       08/09/2022       12:14 PM  ===================================================================  I certify that the above Therapy Services are being furnished while the  patient is under my care. I agree with the treatment plan and certify that this therapy is necessary.    81 Signature:_________________________   DATE:_________   TIME:________                           Jones Skene, Junious Silk,*    ** Signature, Date and Time must be completed for valid certification **  Please sign and return to [facility] or you may fax the signed copy to [phone].  Thank you.

## 2022-08-09 NOTE — Progress Notes (Signed)
PT DAILY TREATMENT NOTE/NEURO EVAL 11-20    Patient Name: Hunter Higgins    Date: 08/09/2022    DOB: 12/28/1950  Insurance: Payor: MEDICARE / Plan: MEDICARE PART A AND B / Product Type: *No Product type* /      Patient DOB verified yes     Visit #   Current / Total 1 10   Time   In / Out 12:20 PM 1:10 PM   Pain   In / Out 7/10 "back" 7/10    Subjective Functional Status/Changes: "I am here because I don't want to fall anymore."      Treatment Area: Gait instability [R26.81]    Subjective functional status:     Mechanism of Injury/Condition: Patient reports he has had multiple falls since having an ulcer about a year ago (was in boot for 12 months). Most recent this past Saturday. Reports he has had 4 falls in the past 12 months that he recalls. Has been having low back pain (with left leg pain) but more so after most recent fall. Walks at 2 am because he is fed up with people trying to get him to walk with an assistive device.  Current Functional Deficits: walking, standing, getting in and out of the car, prolonged sitting, reaching, bending over, picking up trash/managing dustpan,   Previous Treatment/Compliance: has been given multiple assistive devices but has chosen not to use them (finds it embarrassing)   PMHx/Surgical Hx: Dialysis (3x per week), Diabetes, peripheral neuropathy, vision loss (retinopathy); hospitalization in the past year due to kidney (17 days); chest port (right side)   Living Situation: first floor apartment; handicap accessible bathroom   Pt Goals: decrease falls, improve steadiness on feet   Pain: hx of low back pain    OBJECTIVE/EXAMINATION    25 min [x] Eval     - untimed          Therapeutic Procedures:  Tx Min  25 Billable or 1:1 Min (if diff from [employer]) Procedure, Rationale, Specifics   25 25 97535 Self Care/Home Management (timed):  improve patient knowledge and understanding of pain reducing techniques, positioning, posture/ergonomics, home safety, activity modification, and physical  therapy expectations, procedures and progression  to improve patient's ability to progress to PLOF and address remaining functional goals.  (see flow sheet as applicable)     Details if applicable:  home safety, how PT can help, fall risk, current condition, use of an assistive device, activity modifications, back pain management, following up with MD if back pain worsens; redirection to evaluation due to distractibility       Total  25 Total  25 Reminder: MC/BC bill using total billable min of TIMED therapeutic procedures (example: do not include dry needle or estim unattended, both untimed codes, in totals to left)     [x]   Patient Education billed concurrently with other procedures     Physical Therapy Evaluation - Neurologic    Posture: [x]  Poor      Description: thoracic kyhposis, cervical protraction     Gait:  [x]  Abnormal    Device: does not utilize an assistive device      Description: posterior LOB on multiple occasions, sway with static stance, decreased TKE, decreased step height, foot drag, thoracic kyhposis    Strength (MMT): measured in sitting                               Hip L (1-5)  R (1-5)   Hip Flexion 4-/5 4-/5   Hip Ext 3+/5 3+/5   Hip ABD 4-/5 4-/5   Hip ADD 3+/5 3+/5     Knee L (1-5) R (1-5)   Knee Flexion 4-/5 4-/5   Knee Extension 3+5 4-/5   Ankle DF 3+/5 3+/5     Trunk AROM  Unable to formally assess in standing due to unsteadiness on feet and patient's verbalizing discomfort with CGA/SBA from therapist    Tone:   [x]  Abnormal    Details:   hypotonia     Motor Control/Coordination:  Finger Tip to Finger Tip: fair  Alternating Hand Supination/Pronation: fair    Balance/ Equilibrium:         Sitting Balance: Static:  [x]  Fair    []  Poor        Standing Balance: Static:   [x]  Poor                Dynamic:   [x]  Poor    Single Leg Stance:  did not test due to safety concerns in weight bearing (unsteadiness in stance)     Tandem Stance: did not test due to safety concerns in weight bearing  (unsteadiness in stance)     Functional Mobility Status      Transfers:       Sit-Stand: requires BUE, poor eccentric control, multiple attempts, increases back pain  Other:       Impaired Vision:  [x]  Y          Safety Awareness Deficits  [x]  Y        Cognition Deficit :  frequently repeats self; difficulty to redirect conversation; episodes of agitation    Optional Tests:       5 Times Sit to Stand: 11 seconds     ASSESSMENT/Changes in Function: see POC     Patient will continue to benefit from skilled PT services to modify and progress therapeutic interventions, analyze and address functional mobility deficits, analyze and address ROM deficits, analyze and address strength deficits, analyze and address soft tissue restrictions, analyze and cue for proper movement patterns, and analyze and modify for postural abnormalities to address functional deficits and attain remaining goals.    [x]   See Plan of Care - for goals and assessment     PLAN  [x]   Upgrade activities as tolerated       [x]   Continue plan of care  [x]   Update interventions per flow sheet       []   Other:_      Renea Ee, PT 08/09/2022  12:14 PM  Clinical Decision Making : FOTO : 45 /100

## 2022-08-16 ENCOUNTER — Inpatient Hospital Stay: Admit: 2022-08-16 | Payer: MEDICARE | Primary: Internal Medicine

## 2022-08-16 DIAGNOSIS — R2689 Other abnormalities of gait and mobility: Secondary | ICD-10-CM

## 2022-08-16 NOTE — Telephone Encounter (Signed)
Pt inquiring when MRI will he be called about getting his MRI scheduled?

## 2022-08-16 NOTE — Progress Notes (Signed)
PHYSICAL / OCCUPATIONAL THERAPY - DAILY TREATMENT NOTE (updated 1/23)    Patient Name: Hunter Higgins    Date: 08/16/2022    DOB: 1951/02/21  Insurance: Payor: MEDICARE / Plan: MEDICARE PART A AND B / Product Type: *No Product type* /      Patient DOB verified Yes     Visit #   Current / Total 2 10   Time   In / Out 10:20 am 11:14 am   Pain   In / Out 7/10 8/10   Subjective Functional Status/Changes: Patient notes drinking heavily last night due to multiple deaths in his family recently. He notes he has not been exercising since his first day at physical therapy.     TREATMENT AREA =  Other abnormalities of gait and mobility [R26.89]  Other low back pain [M54.59]    OBJECTIVE    Therapeutic Procedures:  Tx Min Billable or 1:1 Min (if diff from Tx Min) Procedure, Rationale, Specifics   16 16 97110 Therapeutic Exercise (timed):  increase ROM, strength, coordination, balance, and proprioception to improve patient's ability to progress to PLOF and address remaining functional goals. (see flow sheet as applicable)     Details if applicable:  warmup, reassessment     8 8 97112 Neuromuscular Re-Education (timed):  improve balance, coordination, kinesthetic sense, posture, core stability and proprioception to improve patient's ability to develop conscious control of individual muscles and awareness of position of extremities in order to progress to PLOF and address remaining functional goals. (see flow sheet as applicable)     Details if applicable:  4 square step test     28 28 97116 Gait Training (timed):      feet with   (assistive device) over   surfaces with   level of assist. Cuing for  .  To improve safety and dynamic movement with household/community ambulation.  (see flow sheet as applicable)     Details if applicable:  FGA assessment            Details if applicable:            Details if applicable:     54 55 MC BC Totals Reminder: bill using total billable min of TIMED therapeutic procedures (example: do not  include dry needle or estim unattended, both untimed codes, in totals to left)  8-22 min = 1 unit; 23-37 min = 2 units; 38-52 min = 3 units; 53-67 min = 4 units; 68-82 min = 5 units   Total Total     _0   Patient Education billed concurrently with other procedures   _1  Review HEP    _2  Progressed/Changed HEP, detail:    _3  Other detail:       Objective Information/Functional Measures/Assessment  FGA: 12/30  Four square step test: 10 seconds with multiple attempts, rest breaks between attempts due to LE fatigue    Good tolerance to treatment today with patient req 100% verbal/tactile cueing and demo for proper form/technique with all newly introduced therex. Patient req multiple rest breaks due to fatigue (LE and cardiovascular). Several LOB noted during FGA assessment, primarily with (R) SLS; pt declines use of safety belt with all therapt, SBA for all;l patient:  "we will get into an altercation if you put that on me".      Patient will continue to benefit from skilled PT / OT services to modify and progress therapeutic interventions, analyze and address functional mobility deficits, analyze and address ROM deficits, analyze and address  strength deficits, analyze and cue for proper movement patterns, analyze and modify for postural abnormalities, analyze and address imbalance/dizziness, and instruct in home and community integration to address functional deficits and attain remaining goals.    Progress toward goals / Updated goals:  _0   See Progress Note/Recertification  Short Term Goals: To be accomplished in 2-4 weeks   Patient to be independent with safety strategies when walking and with transfers (I.e. utilization of AD for gait, keeping phone with him)   Status at IE reviewed  Current: goal not met; will progress next session (08/16/22)  2.  Patient will be able to perform sit to stand on first attempt with BUE assist and proper eccentric control with no LOB upon standing.  Status at IE: unsteadiness upon  standing, form deficits  3. Patient to improve static balance as shown by ability to maintain static stance for at least 45 seconds with minimal sway and LRAD.  Status at IE: CGA due to unsteadiness      Long Term Goals: To be accomplished in 8-12 weeks from initial evaluation   Patient will be independent with progressive HEP to facilitate/maintain gains made in PT upon discharge   Status at IE: will establish initial HEP at first follow up session   2.  Patient will be able to ambulate at least 10 minutes with LRAD and no LOB to indicate improved mobility  Status at IE: unable   3.  Patient will be able to maintain tandem balance bilaterally, with eyes open, on firm surface for at least 15 seconds to indicate improved stability in narrow BOS   Status at IE: unable   4.  Patient will restore trunk flexion range of motion as shown by fingertips to mid-shin with no more than 3/10 pain rating and without AD for functional carryover.  Status at IE: unable    Next PN/ RC due 09/09/22  Auth due [place], PTA    08/16/2022      Future Appointments   Date Time Provider [place]   08/16/2022 10:20 AM Roanna Raider, PTA [facility]   08/18/2022  9:40 AM Leisa Lenz, PT [facility]   08/23/2022  9:40 AM Roanna Raider, PTA [facility]   08/25/2022  9:40 AM Roanna Raider, PTA [facility]   08/30/2022  9:40 AM Johnnye Lana, PT [facility]   09/01/2022  9:40 AM Leisa Lenz, PT [facility]   09/06/2022  9:40 AM Roanna Raider, PTA [facility]   09/08/2022  9:40 AM Roanna Raider, PTA [facility]   09/13/2022  9:40 AM Roanna Raider, PTA [facility]   09/15/2022  9:40 AM Roanna Raider, PTA [facility]   09/27/2022 10:30 AM Louann Sjogren, MD [facility]

## 2022-08-17 NOTE — Telephone Encounter (Signed)
Called patient to confirm if he's been receiving his Trulicity. Prior Auth stated it didn't need authorization. Want to make sure patient has his Trulicity. LVM

## 2022-08-17 NOTE — Telephone Encounter (Signed)
Spoke with pt and informed him that no Mri was suggested by Dr Zebedee Iba or Dr. Valere Dross. Informed pt he is to see [place], MD   [address], [place]   [place], [place]   [phone]  for the lump on his back. Patient appointment is 1/9 at 8 am

## 2022-08-17 NOTE — ED Notes (Signed)
Formatting of this note is different from the original.  Assumed care of pt post report from [place], [place].     Patient complains of SOB x5 days, stridor breath sounds,      Past Medical History:   Diagnosis Date    Abnormal nuclear stress test     Chronic diastolic (congestive) heart failure (HCC)     Chronic kidney disease, stage IV (severe) (HCC)     Diabetic foot ulcer (HCC)     Diabetic retinopathy ([place])     DM2 (diabetes mellitus, type 2) ([place])     Essential (primary) hypertension     GERD (gastroesophageal reflux disease)     Male erectile dysfunction, unspecified     Peripheral arterial disease (HCC)     Prostate cancer (HCC)     Sarcoidosis, unspecified     In remission    SBO (small bowel obstruction) (HCC)     Vitreous hemorrhage (HCC)      Pt is alert, Person, Place, Time, and Situation, ,Normal sinus rhythm,GREATER than 94% on 2L NC, denies pain at this time, special respiratory precautions in place, call bell in reach, pt is Bed bound, ongoing care.    Electronically signed by Imagene Riches, RN at 08/25/2022  8:16 AM EST

## 2022-08-18 ENCOUNTER — Inpatient Hospital Stay: Admit: 2022-08-18 | Payer: MEDICARE | Primary: Internal Medicine

## 2022-08-18 NOTE — Progress Notes (Signed)
PHYSICAL / OCCUPATIONAL THERAPY - DAILY TREATMENT NOTE (updated 1/23)    Patient Name: Hunter Higgins    Date: 08/18/2022    DOB: 1951-06-18  Insurance: Payor: MEDICARE / Plan: MEDICARE PART A AND B / Product Type: *No Product type* /      Patient DOB verified yes     Visit #   Current / Total 3 10 Total Time   Time   In / Out 9:41 10:23 42   Pain   In / Out 7 7    Subjective Functional Status/Changes: I was going up the stairs a few minutes ago, that was painful.     TREATMENT AREA =  Other abnormalities of gait and mobility [R26.89]  Other low back pain [M54.59]    OBJECTIVE      Therapeutic Procedures:  42  Total 42  Total MC BC Totals Reminder: bill using total billable min of TIMED therapeutic procedures (example: do not include dry needle or estim unattended, both untimed codes, in totals to left)  8-22 min = 1 unit; 23-37 min = 2 units; 38-52 min = 3 units; 53-67 min = 4 units; 68-82 min = 5 units   Tx Min Billable or 1:1 Min (if diff from Tx Min) Procedure, Rationale, Specifics   27  97710 Therapeutic Exercise (timed):  increase ROM, strength, coordination, balance, and proprioception to improve patient's ability to progress to PLOF and address remaining functional goals. (see flow sheet as applicable)   Details if applicable:       15  16109 Therapeutic Activity (timed):  use of dynamic activities replicating functional movements to increase ROM, strength, coordination, balance, and proprioception in order to improve patient's ability to progress to PLOF and address remaining functional goals.  (see flow sheet as applicable)   Details if applicable:           [x]   Patient Education billed concurrently with other procedures   [x]  Review HEP    []  Progressed/Changed HEP, detail:    []  Other detail:       Objective Information/Functional Measures/Assessment    - Focus of today's treatment on slow, controlled introduction to therapy interventions. The patient requires maximal verbal cuing and visual demonstration  to learn correct technique with all newly introduced therapy interventions.  - Home exercise program established and reviewed for clarity.   - Initiated with plinth level interventions to build hip strength.    Patient will continue to benefit from skilled PT / OT services to modify and progress therapeutic interventions, analyze and address functional mobility deficits, analyze and address ROM deficits, analyze and address strength deficits, analyze and address soft tissue restrictions, analyze and cue for proper movement patterns, analyze and modify for postural abnormalities, analyze and address imbalance/dizziness, and instruct in home and community integration to address functional deficits and attain remaining goals.    Progress toward goals / Updated goals:  []   See Progress Note/Recertification    Short Term Goals: To be accomplished in 2-4 weeks   Patient to be independent with safety strategies when walking and with transfers (I.e. utilization of AD for gait, keeping phone with him)   Status at IE reviewed  Current: goal not met; will progress next session (08/16/22)  2.  Patient will be able to perform sit to stand on first attempt with BUE assist and proper eccentric control with no LOB upon standing.  Status at IE: unsteadiness upon standing, form deficits  3. Patient to improve static balance as  shown by ability to maintain static stance for at least 45 seconds with minimal sway and LRAD.  Status at IE: CGA due to unsteadiness      Long Term Goals: To be acc6omplished in 8-12 weeks from initial evaluation   Patient will be independent with progressive HEP to facilitate/maintain gains made in PT upon discharge   Status at IE: will establish initial HEP at first follow up session   Current: progressing, established hip strengthening HEP (08/18/22)  2.  Patient will be able to ambulate at least 10 minutes with LRAD and no LOB to indicate improved mobility  Status at IE: unable   3.  Patient will be able to  maintain tandem balance bilaterally, with eyes open, on firm surface for at least 15 seconds to indicate improved stability in narrow BOS   Status at IE: unable   4.  Patient will restore trunk flexion range of motion as shown by fingertips to mid-shin with no more than 3/10 pain rating and without AD for functional carryover.  Status at IE: unable     Next PN/ RC due 09/09/22  Auth due N[facility]    PLAN  [x]  Continue plan of care  [x]   Upgrade activities as tolerated  []   Discharge due to :  []   Other:    Leisa Lenz, PT    08/18/2022    7:42 AM    Future Appointments   Date Time Provider [place]   08/18/2022  9:40 AM Leisa Lenz, PT [facility]   08/23/2022  8:00 AM Earlyne Iba, MD [facility]   08/23/2022  9:40 AM Roanna Raider, PTA [facility]   08/25/2022  9:40 AM Roanna Raider, PTA [facility]   08/30/2022  9:40 AM Johnnye Lana, PT [facility]   09/01/2022  9:40 AM Leisa Lenz, PT [facility]   09/06/2022  9:40 AM Roanna Raider, PTA [facility]   09/08/2022  9:40 AM Roanna Raider, PTA [facility]   09/13/2022  9:40 AM Roanna Raider, PTA [facility]   09/15/2022  9:40 AM Roanna Raider, PTA [facility]   09/27/2022 10:30 AM Louann Sjogren, MD [facility]

## 2022-08-18 NOTE — Telephone Encounter (Signed)
Patient came in office today regarding a phone call I placed to him to inquire about whether or not he has received his Trulicity prescription. I let patient know I'll contact the pharmacy to see if they have it ready.

## 2022-08-23 ENCOUNTER — Ambulatory Visit: Admit: 2022-08-23 | Discharge: 2022-08-23 | Payer: MEDICARE | Attending: Internal Medicine | Primary: Internal Medicine

## 2022-08-23 ENCOUNTER — Encounter: Payer: MEDICARE | Attending: Surgery | Primary: Internal Medicine

## 2022-08-23 ENCOUNTER — Inpatient Hospital Stay: Payer: MEDICARE | Primary: Internal Medicine

## 2022-08-23 DIAGNOSIS — R0602 Shortness of breath: Secondary | ICD-10-CM

## 2022-08-23 NOTE — Consults (Signed)
Formatting of this note is different from the original.  [facility] Department of Otolaryngology - Head and Neck Surgery  Consultation Note    Hunter Higgins  DOB:  August 04, 1951  MRN:  [id number]  PCP: Huston Foley, MD. [address] / [place] [address]. [phone]    Referring Attending Physician: Gwenevere Abbot   Consulting Attending Physician: Dr. Odette Horns Date: 08/23/2022  Date: 08/23/2022, 1:08 PM    Chief Complaint: Hoarseness of voice    ASSESSMENT:   Hunter Higgins is a 72 y.o.male with w/ complex PMH including CAD, ESRD on HD presenting with worsening shortness of breath for last ~24h. Examination notable for inspiratory stridor though saturating well on RA at rest and FFL notable for poor bilateral VF abduction and significant infraglottic edema. Repeat FFL ~1h after Decadron with overall improved voice, inspiratory stridor, and glottic edema.     PLAN:   Stat Decadron 10 mg IV, recommend Q6H IV Decadron 10 mg  Can consider racemic epinephrine or duonebs for comfort.  Recommend NPO for now; will re-evaluate around 6/7 PM. If doing better, may transition to CLD.   When able to lay flat, recommend CT Head w/o contrast, CT neck/chest with contrast for formal evaluation of both lipomatous lesion on left posterior back and the pathway of bilateral RLN/vagus nerve. May need general surgery evaluation of neck mass after imaging obtained.   Etiology unclear - could be related to neck mass ?malignant metastatic process vs. Sarcoidosis vs. URI vs. Fluid overload from ESRD (though dialyzed yesterday)  Will re-evaluate with scope exam in the morning.  If any worsened respiratory symptoms, patient responded well to steroids + rac epi earlier. If urgent distress requiring intubation, can be transorally intubated by anesthesia with 6.0 ETT. If there is worsened symptoms, would prefer however to take patient Code 1 to OR for airway management.   Difficult airway banner added and posterior glottic  stenosis and glottic edema. Can resolve this after admission.   Discussed with attending, Dr. Sabino Dick, who is in agreement with above.    Please see my separately dictated attestation.    Sabino Dick, MD    SUBJECTIVE:  History of Presenting Illness:   Hunter Higgins is a 72 y.o. male h/o of CAD, ESRD on HD, and CHF. He is describing 2 days of hoarseness. Patient notes feeling SOB at dialysis yesterday with some improvement of symptoms. He woke up around 2 AM this morning with worsening shortness of breath. He presented to the ED around 9 AM. Upon arrival, patient feels like the shortness of breath has worsened in the last hour. He was about to leave the Ed because nobody has evaluated him. He also describes worsening dysphagia. He endorses dysphonia as well. He denies recent fever, chills, or cough. He denies recent surgeries or intubations. He does note initiation of a new medication Trulicity on Sunday. He otherwise denies any new medications or exposures. He denies significant tobacco and alcohol use.     Past Medical History:  Past Medical History:   Diagnosis Date    Abnormal nuclear stress test     Chronic diastolic (congestive) heart failure (HCC)     Chronic kidney disease, stage IV (severe) (HCC)     Diabetic foot ulcer (HCC)     Diabetic retinopathy ([place])     DM2 (diabetes mellitus, type 2) ([place])     Essential (primary) hypertension     GERD (gastroesophageal reflux disease)  Male erectile dysfunction, unspecified     Peripheral arterial disease (HCC)     Prostate cancer (HCC)     Sarcoidosis, unspecified     In remission    SBO (small bowel obstruction) (HCC)     Vitreous hemorrhage (HCC)      Past Surgical History:   Past Surgical History:   Procedure Laterality Date    EXPLORATORY LAPAROTOMY      Lysis of adhesions    HEART CATHETERIZATION  12/30/2020      HERNIA REPAIR      Inguinal    ORTHOPEDIC SURGERY      Shoulder    RADICAL PROSTATECTOMY      VENOUS ACCESS CATHETER INSERTION N/A  12/28/2020    Procedure: INSERTION, CATHETER, TUNNELED, FOR DIALYSIS;  Surgeon: Ned Grace, MD    VENOUS ACCESS CATHETER REPLACEMENT N/A 04/09/2021    Procedure: REPLACEMENT, CATHETER, HEMODIALYSIS, TUNNELED;  Surgeon: Doris Cheadle, Rocco Serene F, MD     Prior to Admission Medications:   Home Medication List - Marked as Reviewed on 12/21/21 0900   Medication Sig   acetaminophen (TYLENOL) 325 mg PO TABS Take 2 Tabs by Mouth Every 4 Hours As Needed for Fever or Pain.   amLODIPine (NORVASC) 5 mg PO TABS Take 1 Tab by Mouth Once a Day.   aspirin 81 mg PO CHEW Take 1 Tab by Mouth Once a Day.   atorvastatin (LIPITOR) 20 mg PO TABS Take 1 Tab by Mouth Every Night at Bedtime.   b complex-vitamin c-folic acid (NEPHRO/TRIPHROCAPS) 1 mg PO CAPS Take 1 Cap by Mouth Once a Day.   carbamide peroxide (DEBROX) 6.5 % OT Drop Instill 5 Drops in both ears Twice Daily.   clopidogreL (PLAVIX) 75 mg PO TABS Take 1 Tab by Mouth Once a Day.   famotidine (PEPCID) 20 mg PO TABS Take 1 Tab by Mouth Once a Day.   furosemide (LASIX) 80 mg PO TABS Take 1 Tab by Mouth 2X Daily Diuretic.   gabapentin (NEURONTIN) 300 mg PO CAPS Take 1 Cap by Mouth 3 Times Daily.   hydrALAZINE (APRESOLINE) 100 mg PO TABS Take 1 Tab by Mouth 3 Times Daily.   insulin LISPRO (HUMALOG VIAL) 100 unit/mL SC SOLN Inject 1-6 Units beneath the skin 4 Times a Day Before Meals & at Bedtime.   isosorbide mononitrate CR (IMDUR) 60 mg PO TB24 Take 1 Tab by Mouth Once a Day.   melatonin 3 mg PO TABS Take 1 Tab by Mouth nightly as needed (insomnia).   nitroglycerin (NITROSTAT) 0.4 mg SL SUBL Dissolve 1 Tab under tongue Every 5 Minutes as Needed.   ondansetron (ZOFRAN) 4 mg PO ODT. Take 1 Tab by Mouth Every 8 Hours As Needed for Nausea (PRN FOR NAUSEA AND VOMITING). ALLOW TABLET TO DISSOLVE ON TONGUE.   tamsulosin (FLOMAX) 0.4 mg PO CAPS Take 1 Cap by Mouth Daily After supper.   torsemide (DEMADEX) 20 mg PO TABS Take 5 Tabs by Mouth Once a Day.   traZODone 150 mg PO TABS Take 50 mg by  Mouth.     Family History:   Family History   Problem Relation Age of Onset    Diabetes Mother      Social History:  Social History     Tobacco Use    Smoking status: Former    Smokeless tobacco: Never   Brewing technologist Use: Never used   Substance Use Topics    Alcohol use: Yes  Comment: Occasional social    Drug use: Never       Allergies:   Allergies   Allergen Reactions    Naproxen gi distress     OBJECTIVE:  Temp (24hrs), Avg:98.2 F (36.8 C), Min:98.2 F (36.8 C), Max:98.2 F (36.8 C)    Temp: 98.2 F (36.8 C)  Pulse: 77  BP: 125/49  Resp: 10  SpO2: 96 %    Height: 5\' 7"  (170.2 cm)   Weight: 70.3 kg (154 lb 15.7 oz)  Body mass index is 24.27 kg/m.    Physical Examination:   General: No acute distress. Well developed, well nourished male  Voice: Mildly hoarse, moderately strained with deep breaths.   Head/Face/Scalp: Normocephalic, atraumatic. No obvious scalp lesions  Eyes: EOMI, PERRL, sclera anicteric  Ears: Normal position and rotation, no auricular lesions. Hearing is grossly intact.   Nose: Dorsum straight, normal projection. Nasal passages patent  Oral Cavity: No trismus. Th floor of mouth and tongue are soft to palpation. Poor dentition.  Oropharynx: Tonsils symmetric. Soft palate and uvula without mass or lesion, posterior pharynx symmetric  Neck: Neck is soft and flat, large left posterior back lipomatous lesion.   Cranial Nerves: CN II-XII grossly intact  Respiratory: Inspiratory stridor, saturating well on RA  Cardiovascular: 2+ pulses with regular rhythm. Good capillary refill, no digital clubbing or cyanosis    Flexible Fiberoptic Laryngoscopy:   After obtaining verbal consent, the right nasal passage(s) were prepared with a topical mixture of 4% topical lidocaine and 0.05% oxymetazoline. Next, the flexible endoscope was inserted into the right nare, which was grossly normal. Nasopharynx grossly normal without mass or lesion. Posterior pharynx symmetric without mass or lesion. The base  of tongue is normal and symmetric. Vallecula is clear. The epiglottis and aryepiglottic folds are crisp and without mass or lesion. The supraglottic inlet is unobstructed. The true vocal folds have poor abduction. There is significant glottic and infraglottic edema. Unable to visualize the subglottis.  The piriform sinuses are clear and without pooling of secretions.      Findings:   - True vocal fold bilateral poor abduction  - Significant glottic and infraglottic edema     Labs:  CBC w/Diff   Recent Labs     08/23/22  1105   WBC 5.1   HEMOGLOBIN 10.6*   HCT 34.8*   PLATELET 184   MCV 75*   SEGS 50   LYMPHOCYTES 35   MONOS 11   EOS 3   BASOS 1   RDW 17.2*       Basic Metabolic Profile   Recent Labs     08/23/22  1105   NA 141   POTASSIUM 5.3   CHLORIDE 97*   CO2 23   BUN 47*   CREAT 11.3*   GLUCOSE 127*   CALCIUM 9.4        Hepatic Function   No results for input(s): "SGOTAST", "SGPTALT", "ALKPHOS", "BILIT", "BILID", "TOTPR", "ALBUMIN", "AMYLASE", "LIPASE" in the last 72 hours.      Coagulation   No results for input(s): "PT", "INR", "APTT" in the last 72 hours.      Glucose   Recent Labs     08/23/22  1105   GLUCOSE 127*       Imaging:   CXR  Impression     1.  Hazy opacification at the left lung base, probably atelectasis or pneumonia.         Signed By: Reyne Dumas, MD on  08/23/2022 11:05 AM     Kathrynn Ducking, MD, PGY-3   [facility]  08/23/2022, 1:08 PM   Electronically signed by Sabino Dick, MD at 08/25/2022  5:31 PM EST

## 2022-08-23 NOTE — ED Notes (Signed)
Formatting of this note is different from the original.     08/23/22 1814   Lab Draw   Lab Draw Time 1800   Lab Sent Time 1810   Lab Draw Site Right;Hand   Lab Draw Device Angiocatheter   Device Gauge 22   # of Lab Draw Attempts 1 Attempt   Labs Drawn with IV Start Yes   Lab Tubes Green   # Blood Cultures 2 sets       Electronically signed by Rebeca Allegra at 08/23/2022  6:15 PM EST

## 2022-08-23 NOTE — ED Notes (Signed)
Formatting of this note might be different from the original.  Neb treatment completed. Patient talking in full sentences. States to have a sore throat. Able to talk easier. Denies change in breathing. MD Demian at bedside and assessed patient.   Electronically signed by Johnnette Barrios, RN at 08/23/2022  2:05 PM EST

## 2022-08-23 NOTE — ED Notes (Signed)
Formatting of this note might be different from the original.  ENT at bedside  Electronically signed by Juanell Fairly, RN at 08/23/2022  1:15 PM EST

## 2022-08-23 NOTE — Progress Notes (Signed)
Formatting of this note is different from the original.  Pharmacy Best Possible Medication History (BPMH) Note    Rx Adm Med Hx: Complete  List updated prior to admission med rec?: No    Medication information was obtained from:  patient, electronic claims database, and past medical records    Medication history interview was conducted: in person  PPE worn: mask-surgical  Patient masked: No              Review Prior to Admission (PTA) medication list for more details.    Allergies   Allergen Reactions    Naproxen gi distress     Prior to Admission Medications   Prescriptions Last Dose Patient Reported? Taking?   acetaminophen (TYLENOL) 325 mg PO TABS PRN No Yes   Sig: Take 2 Tabs by Mouth Every 4 Hours As Needed for Fever or Pain.   aspirin 81 mg PO CHEW 08/21/2022 Yes Yes   Sig: Take 1 Tab by Mouth Once a Day.   atorvastatin (LIPITOR) 20 mg PO TABS Past Month Yes Yes   Sig: Take 1 Tab by Mouth Every Night at Bedtime.   b complex-vitamin c-folic acid (NEPHRO/TRIPHROCAPS) 1 mg PO CAPS  No No   Sig: Take 1 Cap by Mouth Once a Day.   dulaglutide (TRULICITY) 4.33 IR/5.1 mL SC Pen Injector 08/21/2022 Yes Yes   Sig: Inject 0.5 mL beneath the skin Every Sunday.   gabapentin (NEURONTIN) 300 mg PO CAPS 08/18/2022 Yes No   Sig: Take 1 Cap by Mouth Once a Day. Only on Tuesday and Thursday   melatonin 3 mg PO TABS PRN Yes Yes   Sig: Take 1 Tab by Mouth nightly as needed.   multivitamin with minerals (ONE-A-DAY 50 PLUS PO)  Yes Yes   Sig: Take 1 Tab by Mouth Once a Day.     Facility-Administered Medications: None     Medications Discontinued During This Encounter   Medication Reason    Implement Electrolyte Replacement Protocol     Implement Calcium Replacement Protocol     Implement Magnesium Replacement Protocol     Implement Phosphate Replacement Protocol     Implement Potassium Replacement Protocol     amLODIPine (NORVASC) 5 mg PO TABS Removed from list: Not a Home Med    aspirin 81 mg PO CHEW Removed from list: Not a Home Med     atorvastatin (LIPITOR) 20 mg PO TABS Removed from list: Not a Home Med    carbamide peroxide (DEBROX) 6.5 % OT Drop Removed from list: Not a Home Med    traZODone 150 mg PO TABS Removed from list: Not a Home Med    torsemide (DEMADEX) 20 mg PO TABS Removed from list: Not a Home Med    tamsulosin (FLOMAX) 0.4 mg PO CAPS Removed from list: Not a Home Med    ondansetron (ZOFRAN) 4 mg PO ODT. Removed from list: Not a Home Med    nitroglycerin (NITROSTAT) 0.4 mg SL SUBL Removed from list: Not a Home Med    melatonin 3 mg PO TABS Removed from list: Not a Home Med    isosorbide mononitrate CR (IMDUR) 60 mg PO TB24 Removed from list: Not a Home Med    insulin LISPRO (HUMALOG VIAL) 100 unit/mL SC SOLN Removed from list: Not a Home Med    hydrALAZINE (APRESOLINE) 100 mg PO TABS Removed from list: Not a Home Med    furosemide (LASIX) 80 mg PO TABS Removed from list: Not a  Home Med    famotidine (PEPCID) 20 mg PO TABS Removed from list: Not a Home Med    clopidogreL (PLAVIX) 75 mg PO TABS Removed from list: Not a Home Med       Electronically signed by Midge Minium at 08/23/2022  4:59 PM EST

## 2022-08-23 NOTE — ED Provider Notes (Signed)
Associated Order(s): Critical Care Management  Post-Procedure Diagnose(s): Stridor  Formatting of this note is different from the original.    [place]    Time of Arrival:   08/23/22 1007    Final diagnoses:   [R06.1] Stridor (Primary)   [N18.6] End stage renal disease (HCC)   [I25.10] Coronary artery disease involving native heart without angina pectoris, unspecified vessel or lesion type     Medical Decision Making:      Differential Diagnosis:    COPD exacerbation, CHF, pneumonia, pulmonary edema, pleural effusion, tobacco use, upper airway swelling, others    Social Determinants of Health:  social factors reviewed, did not limit treatment                               Supplemental Historians include:  patient    ED Course: Patient was emergency department via EMS with intermittent inspiratory stridor.  Speaking in full sentences, not in any respiratory distress.    No history of the same, no history of chronic lung disease.  No recent viral symptoms, no fevers or evidence of infection to suggest epiglottitis.    ED Course as of 08/23/22 2049   Tue Aug 23, 2022   1054 Reassessed patient, stable on room air, still having intermittent stridor [JS]     No wheezing, patient's volume status appears appropriate, had a full run of dialysis yesterday, and low suspicion that acute pulmonary edema is causing patient's shortness of breath.    Majority of workup has been reassuring, troponin and proBNP are both below baseline, otherwise labs are relatively nondiagnostic.  X-ray without any evidence of significant pulmonary edema.    Discussed with ENT who came to bedside to scope the patient.  They concerns for patient's airway, recommended immediate Decadron, racemic epi.    Patient's stridor symptoms appear to be worsening, now more persistent, especially when he gets worked up and anxious which she has been intermittently secondary to the feeling that he cannot breathe.  When he calms  down though his stridor does improve.    I discussed with ICU due to need for likely airway watch.    Patient admitted to ICU.    As of 08/23/2022, 8:51 PM  The differential diagnosis and / or critical care lists were considered including infections, sepsis, severe sepsis, and septic shock and found unlikely unless otherwise documented in the final clinical impression or diagnosis list.     Documentation/Prior Results Review:  Old medical records, Nursing notes    Rhythm interpretation from monitor: normal sinus rhythm    Imaging Interpreted by me: X-Ray chest with no significant pulmonary edema    EKG 12-LEAD   Final Result     CHEST PORTABLE   Final Result     1.  Hazy opacification at the left lung base, probably atelectasis or pneumonia.         Signed By: Reyne Dumas, MD on 08/23/2022 11:05 AM       SOFT TISSUE NECK    (Results Pending)   ED CT HEAD NO CONTRAST    (Results Pending)   CT ST NECK W/ CONTRAST    (Results Pending)   CT CHEST W/ CONTRAST    (Results Pending)     .     Discussion of Management with other Physicians, QHP or Appropriate Source:   Admitting team ICU and Consultant ENT, please see full consult  note    Critical Care Management    Date/Time: 08/23/2022 9:27 PM    Authorized by: Lynnell Jude, MD,RES  Systems at Risk: Upper airway  Associated Problems: Hypoxia (Airway swelling)  Procedures/Services: ECG Interpretation and CXR Interpretation  Management: Bedside management, Case discussion related to critical care, Case documentation, Test review and Record review  Net Critical Care Time: 35    Disposition:  Admit    New Prescriptions    No medications on file     Chief Complaint   Patient presents with    RESPIRATORY PROBLEM    SHORTNESS OF BREATH     SHORTNESS OF BREATH  Associated symptoms: no chest pain, no cough, no fever and no sore throat     this is a 72 year old male with past medical history of ESRD on dialysis Monday Wednesday Friday who got dialysis yesterday, CAD medically  managed, diastolic heart failure, diabetes presenting to the emergency department with complaint of shortness of breath.  He states that it started  yesterday.  Initially got better after a run of dialysis however recurred this morning.  He went to his PCPs office where he was found to be hypoxic with some increased respiratory effort.  He denies any recent viral symptoms including sore throat, cough, congestion, he has no chest pain, just feeling like he cannot catch his breath.  He is experienced multiple losses in the past week including any son and nephew, both were traumatic.    Review of Systems:  Constitutional:  Negative for fever and chills.   HENT:  Negative for sore throat.    Respiratory:  Positive for shortness of breath. Negative for cough.    Cardiovascular:  Negative for chest pain.     Physical Exam  Vitals and nursing note reviewed.   Constitutional:       Appearance: Normal appearance.   HENT:      Head: Normocephalic and atraumatic.   Eyes:      Extraocular Movements: Extraocular movements intact.      Pupils: Pupils are equal, round, and reactive to light.   Cardiovascular:      Rate and Rhythm: Normal rate and regular rhythm.      Pulses: Normal pulses.   Pulmonary:      Effort: Pulmonary effort is normal.      Breath sounds: Normal breath sounds.      Comments: Clear to auscultation bilaterally however with prolonged expiration.  There is upper airway noisy respirations consistent with possible stridor.  Abdominal:      General: Abdomen is flat.      Palpations: Abdomen is soft.      Tenderness: There is no abdominal tenderness.   Musculoskeletal:         General: Normal range of motion.   Skin:     General: Skin is warm and dry.   Neurological:      General: No focal deficit present.      Mental Status: He is alert.   Psychiatric:         Mood and Affect: Mood normal.         Behavior: Behavior normal.     Past Medical History:   Diagnosis Date    Abnormal nuclear stress test     Chronic  diastolic (congestive) heart failure (HCC)     Chronic kidney disease, stage IV (severe) (HCC)     Diabetic foot ulcer (HCC)     Diabetic retinopathy ([place])  DM2 (diabetes mellitus, type 2) (HCC)     Essential (primary) hypertension     GERD (gastroesophageal reflux disease)     Male erectile dysfunction, unspecified     Peripheral arterial disease (HCC)     Prostate cancer (HCC)     Sarcoidosis, unspecified     In remission    SBO (small bowel obstruction) (HCC)     Vitreous hemorrhage (HCC)      Past Surgical History:   Procedure Laterality Date    EXPLORATORY LAPAROTOMY      Lysis of adhesions    HEART CATHETERIZATION  12/30/2020      HERNIA REPAIR      Inguinal    ORTHOPEDIC SURGERY      Shoulder    RADICAL PROSTATECTOMY      VENOUS ACCESS CATHETER INSERTION N/A 12/28/2020    Procedure: INSERTION, CATHETER, TUNNELED, FOR DIALYSIS;  Surgeon: Ned Grace, MD    VENOUS ACCESS CATHETER REPLACEMENT N/A 04/09/2021    Procedure: REPLACEMENT, CATHETER, HEMODIALYSIS, TUNNELED;  Surgeon: Doris Cheadle, [place] F, MD     Family History   Problem Relation Age of Onset    Diabetes Mother      Social History     Occupational History    Not on file   Tobacco Use    Smoking status: Former    Smokeless tobacco: Never   Vaping Use    Vaping Use: Never used   Substance and Sexual Activity    Alcohol use: Yes     Comment: Occasional social    Drug use: Never    Sexual activity: Not Currently     Outpatient Medications Marked as Taking for the 08/23/22 encounter [facility] Encounter)   Medication Sig Dispense Refill    acetaminophen (TYLENOL) 325 mg PO TABS Take 2 Tabs by Mouth Every 4 Hours As Needed for Fever or Pain.      aspirin 81 mg PO CHEW Take 1 Tab by Mouth Once a Day.      atorvastatin (LIPITOR) 20 mg PO TABS Take 1 Tab by Mouth Every Night at Bedtime.      dulaglutide (TRULICITY) 0.98 JX/9.1 mL SC Pen Injector Inject 0.5 mL beneath the skin Every Sunday.      melatonin 3 mg PO TABS Take 1 Tab by Mouth nightly as needed.       multivitamin with minerals (ONE-A-DAY 50 PLUS PO) Take 1 Tab by Mouth Once a Day.       Allergies   Allergen Reactions    Naproxen gi distress     Vital Signs:  Patient Vitals for the past 72 hrs:   Temp Heart Rate Pulse Resp BP BP Mean SpO2 Weight   08/23/22 1800 -- 88 89 16 163/84 98 MM HG 97 % --   08/23/22 1700 -- 83 83 16 136/61 79 MM HG 100 % --   08/23/22 1600 96.6 F (35.9 C) 72 79 16 144/46 72 MM HG 100 % --   08/23/22 1500 -- 73 75 -- 134/56 73 MM HG 100 % --   08/23/22 1400 -- 78 84 14 157/57 79 MM HG 90 % --   08/23/22 1354 -- 85 82 14 158/59 84 MM HG 99 % --   08/23/22 1300 -- 68 79 13 130/53 74 MM HG 96 % --   08/23/22 1200 -- 72 77 10 125/49 68 MM HG 96 % --   08/23/22 1100 -- 80 84 13 122/64 73 MM HG 99 % --  08/23/22 1010 98.2 F (36.8 C) 87 -- 16 159/70 100 MM HG 99 % 70.3 kg (154 lb 15.7 oz)     Diagnostics:  Labs:    Results for orders placed or performed during the hospital encounter of 21/30/86   BASIC METABOLIC PANEL   Result Value Ref Range    Potassium 5.3 3.5 - 5.5 mmol/L    Sodium 141 133 - 145 mmol/L    Chloride 97 (L) 98 - 110 mmol/L    Glucose 127 (H) 70 - 99 mg/dL    Calcium 9.4 8.4 - 10.5 mg/dL    BUN 47 (H) 6 - 22 mg/dL    Creatinine 11.3 (H) 0.8 - 1.6 mg/dL    CO2 23 20 - 32 mmol/L    eGFR 4.4 (L) >60.0 mL/min/1.73 sq.m.    Anion Gap 21.0 (H) 3.0 - 15.0 mmol/L   TROPONIN   Result Value Ref Range    Troponin (T) Quant High Sensitivity (5th Gen) 249 (H) 0 - 19 ng/L   NT PROBNP   Result Value Ref Range    NT proBNP 4,493 (H) <=125 pg/mL   CBC WITH DIFFERENTIAL AUTO   Result Value Ref Range    WBC 5.1 4.0 - 11.0 K/uL    RBC 4.65 3.80 - 5.80 M/uL    HGB 10.6 (L) 12.6 - 17.1 g/dL    HCT 34.8 (L) 37.8 - 52.2 %    MCV 75 (L) 80 - 95 fL    MCH 23 (L) 26 - 34 pg    MCHC 31 31 - 36 g/dL    RDW 17.2 (H) 10.0 - 15.5 %    Platelet 184 140 - 440 K/uL    MPV 9.8 9.0 - 13.0 fL    Segmented Neutrophils (Auto) 50 40 - 75 %    Lymphocytes (Auto) 35 20 - 45 %    Monocytes (Auto) 11 3 - 12 %     Eosinophils (Auto) 3 0 - 6 %    Basophils (Auto) 1 0 - 2 %    Absolute Neutrophils (Auto) 2.5 1.8 - 7.7 K/uL    Absolute Lymphocytes (Auto) 1.8 1.0 - 4.8 K/uL    Absolute Monocytes (Auto) 0.6 0.1 - 1.0 K/uL    Absolute Eosinophils (Auto) 0.2 0.0 - 0.5 K/uL    Absolute Basophils (Auto) 0.0 0.0 - 0.2 K/uL   CBC   Result Value Ref Range    WBC 6.6 4.0 - 11.0 K/uL    RBC 4.41 3.80 - 5.80 M/uL    HGB 10.0 (L) 12.6 - 17.1 g/dL    HCT 32.7 (L) 37.8 - 52.2 %    MCV 74 (L) 80 - 95 fL    MCH 23 (L) 26 - 34 pg    MCHC 31 31 - 36 g/dL    RDW 17.0 (H) 10.0 - 15.5 %    Platelet 175 140 - 440 K/uL    MPV 10.1 9.0 - 13.0 fL   APTT   Result Value Ref Range    APTT 26 22 - 36 sec   PT-INR   Result Value Ref Range    Protime 11.3 9.0 - 13.0 sec    INR 1.02 0.89 - 1.29   Hepatic Function Panel now   Result Value Ref Range    Albumin 4.3 3.5 - 5.0 g/dL    Total Protein 7.6 6.2 - 8.1 g/dL    Globulin 3.3 2.0 - 4.0 g/dL    A/G  Ratio 1.3 1.1 - 2.6 ratio    Bilirubin Total 0.3 0.2 - 1.2 mg/dL    Bilirubin Direct <0.2 0.0 - 0.3 mg/dL    SGOT (AST) 18 10 - 37 U/L    Alkaline Phosphatase 108 40 - 125 U/L    SGPT (ALT) 17 5 - 40 U/L     ECG:  Results for orders placed or performed during the hospital encounter of 08/23/22   EKG 12-LEAD   Result Value Ref Range Status    Heart Rate 87 bpm Final    RR Interval 660 ms Final    Atrial Rate 84 ms Final    P-R Interval 280 ms Final    P Duration 212 ms Final    P Horizontal Axis 202 deg Final    P Front Axis 252 deg Final    Q Onset 503 ms Final    QRSD Interval 93 ms Final    QT Interval 420 ms Final    QTcB 517 ms Final    QTcF 475 ms Final    QRS Horizontal Axis -25 deg Final    QRS Axis 45 deg Final    I-40 Front Axis 55 deg Final    t-40 Horizontal Axis -35 deg Final    T-40 Front Axis 35 deg Final    T Horizontal Axis 79 deg Final    T Wave Axis 8 deg Final    S-T Horizontal Axis 136 deg Final    S-T Front Axis 262 deg Final    Impression - ABNORMAL ECG -  Final    Impression   Final      SEAR-Sinus or ectopic atrial rhythm-P axis (-45,135)    Impression SARSV-Sinus pause-long R-R interval, normal QRSd  Final    Impression 1AVB-Prolonged PR interval-PR >220, V-rate 50-90  Final    Impression   Final     LVHSR-Consider left ventricular hypertrophy-(S V1+R V5/V6) >3.102mV    Impression   Final     STELVH-Anterior ST elevation, probably due to LVH-ST >0.20 mV in V1-V4 & LVH    Impression LQTB-Borderline prolonged QT interval-QTc >445mS  Final    Impression -similar to previous-  Final     Medications ordered/given in the ED  Medications   lidocaine (Xylocaine) 4 % (40 mg/mL) solution 5 mL (0 mL Nebulization Held 08/23/22 1324)   ondansetron (PF) (Zofran) injection 4 mg (0 mg IV Push Held 08/23/22 1324)   RACEPINEPHRINE 2.25 % SOLUTION FOR NEBULIZATION (  Canceled Entry 08/23/22 1533)   heparin injection 5,000 Units (5,000 Units Subcutaneous Given 08/23/22 1554)   atropine injection 1 mg (has no administration in time range)   nitroglycerin (Nitrostat) tablet 0.4 mg (has no administration in time range)   NS Flush injection 10 mL (has no administration in time range)   calcium gluconate in 56ml NS IVPB 1 g (has no administration in time range)   calcium gluconate 2 gram/100 mL in 116ml NS IVPB 2 g (has no administration in time range)   magnesium oxide (Mag-Ox) tablet 400-800 mg (has no administration in time range)   magnesium sulfate 1g/D5W 167ml ivpb 1 g (has no administration in time range)   magnesium sulfate 2g/83ml (4%) in water IVPB 2 g (has no administration in time range)   PHOS-NAK 280-160-250 mg powder 2 Packet (has no administration in time range)   potassium phosphate (K Phos Neutral/Phospha Neutral) 250 mg tablet 2 Tab (has no administration in time range)   sodium  phosphate 6 mmol in D5W 50 mL IVPB (has no administration in time range)   sodium phosphate 9 mmol in D5W 100 mL IVPB (has no administration in time range)   sodium phosphate 12 mmol in D5W 100 mL IVPB (has no administration in time  range)   sodium phosphate 18 mmol in D5W 100 mL IVPB (has no administration in time range)   sodium phosphate 21 mmol in D5W 100 mL IVPB (has no administration in time range)   potassium chloride ER (K-Dur;Klor-Con) tablet 20-60 mEq (has no administration in time range)   potassium chloride ER (K-Dur;Klor-Con) tablet 20-60 mEq (has no administration in time range)   potassium chloride (Klor-Con) packet 20-60 mEq (has no administration in time range)   potassium chloride in water (KCL) 10 mEq/100 mL IVPB 10 mEq (has no administration in time range)   potassium chloride in water 20 mEq/100 mL infusion 20 mEq (has no administration in time range)   potassium chloride in water 20 mEq/50 mL infusion 20 mEq (has no administration in time range)   dexAMETHasone PF (Decadron) injection 10 mg (has no administration in time range)   D50W injection 25 mL (has no administration in time range)   glucagon (Glucagen) injection 1 mg (has no administration in time range)   glucose (Dex4 Glucose) chew tab 4 Tab (has no administration in time range)   insulin glargine (Lantus vial) injection 14 Units ( Subcutaneous Automatically Held 08/26/22 1800)   Hold Medication Daily Until Reordered (has no administration in time range)   insulin LISPRO (HumaLOG vial) injection 1-6 Units (0 Units Subcutaneous Held 08/23/22 1709)   ampicillin/sulbactam (Unasyn) 3 g in NS 100 mL IVPB (3 g Intravenous New Bag 08/23/22 1814)   racEPINEPHrine (Asthmanefrin) 2.25 % nebulizer solution 0.5 mL (0.5 mL Inhalation Given 08/23/22 1841)   iohexol (OmniPaque) 350 mg iodine/mL solution 100 mL (has no administration in time range)   dexAMETHasone PF (Decadron) injection 10 mg (10 mg Intravenous Given 08/23/22 1314)   racEPINEPHrine (Asthmanefrin) 2.25 % nebulizer solution 0.5 mL (0.5 mL Inhalation Given 08/23/22 1323)       Electronically signed by Gwenevere Abbot, MD at 09/09/2022  8:57 AM EST

## 2022-08-23 NOTE — ED Triage Notes (Signed)
Formatting of this note might be different from the original.  Here via EMS with report of SOB x3 days.  Reports wheezing/stridorous sounds started yesterday.  Hx ESRD with TDC.  [facility] tx x2 given PTA by EMS.   Electronically signed by Kennieth Francois, RN at 08/23/2022 10:10 AM EST

## 2022-08-23 NOTE — Progress Notes (Signed)
Subjective:      Patient ID: Hunter Higgins is a 72 y.o. male.    Acute visit    Patient with known history of end-stage renal disease on dialysis 3 times a week, last session was yesterday.  When he went back home he was feeling slightly short of breath.  This morning he woke up with worsening of the shortness of breath.  He asked her daughter who was in town, to drive him to our clinic for evaluation.  Patient was put on the schedule and seen right away.  Patient has some stridor.  I do not hear crackles or wheezing in lung auscultation, but there was a decreased breath sounds in both bilateral bases.  I did not notice swelling in legs.  Patient looks very agitated.  I am concerned about cardiorenal syndrome or flash pulmonary edema.  I called EMS to have the patient transported to the emergency for evaluation.  In the meanwhile he was 98% on room air and decreased to 94% on room air by the time EMS arrived. After reviewing notes from the ED today, chest x-ray revealed hazy opacification at the left lung base, probably atelectasis or pneumonia.  Troponins 249 elevated.  NT proBNP4,493 elevated.  Hemoglobin 10.6.  Currently patient under evaluation in the hospital.  Before the patient leaves I advised him to go directly to the emergency if an episode of shortness of breath with this characteristics happens again in the future.          GERD  famotidine (PEPCID) 20 MG tablet, Take 1 tablet by mouth 2 times daily, Disp: , Rfl:     ondansetron (ZOFRAN-ODT) 4 MG disintegrating tablet, Take 1 tablet by mouth every 8 hours as needed, Disp: , Rfl:     Diabetes mellitus type 2  Dulaglutide (TRULICITY) 1.60 FU/9.3AT SOPN, Inject 0.75 mg into the skin every 7 days, Disp: 5 Adjustable Dose Pre-filled Pen Syringe, Rfl: 3  insulin lispro (HUMALOG) 100 UNIT/ML SOLN injection vial, Inject into the skin (Patient not taking: Reported on 07/26/2022), Disp: , Rfl:     Diabetic neuropathy  gabapentin (NEURONTIN) 300 MG capsule, Take 1  capsule by mouth 3 times daily for 90 days. Max Daily Amount: 900 mg, Disp: 270 capsule, Rfl: 0    ESRD  On dialysis 3 times a day.      OTC  acetaminophen (TYLENOL) 325 MG tablet, Take by mouth (Patient not taking: Reported on 07/26/2022), Disp: , Rfl:   aspirin 81 MG chewable tablet, Take 1 tablet by mouth daily, Disp: , Rfl:     traZODone (DESYREL) 50 MG tablet, Take 1 tablet by mouth nightly (Patient not taking: Reported on 07/26/2022), Disp: 30 tablet, Rfl: 3  furosemide (LASIX) 20 MG tablet, Take by mouth daily (Patient not taking: Reported on 07/26/2022), Disp: , Rfl:   torsemide (DEMADEX) 100 MG tablet, Take 1 tablet by mouth daily (Patient not taking: Reported on 07/26/2022), Disp: , Rfl:       Shortness of Breath        Review of Systems   Respiratory:  Positive for shortness of breath.        Objective:   Visit Vitals  BP 127/66 (Site: Left Upper Arm, Position: Sitting, Cuff Size: Medium Adult)   Pulse 82   Resp (!) 99   Ht 1.702 m (5\' 7" )   SpO2 99%   BMI 27.44 kg/m        Physical Exam  Cardiovascular:  Rate and Rhythm: Normal rate and regular rhythm.   Pulmonary:      Comments: Decreased breath sounds in both bases.  I did not auscultate wheezing or crackles.  Patient does have any stridor.  Musculoskeletal:      Cervical back: Normal range of motion.      Right lower leg: No edema.      Left lower leg: No edema.         Assessment:       ICD-10-CM    1. Shortness of breath  R06.02               Plan:   Return After hospital discharge.Louann Sjogren, MD

## 2022-08-23 NOTE — H&P (Signed)
Formatting of this note is different from the original.  Images from the original note were not included.      [facility]  Admission/Consult History and Physical    Date of Admission:  08/23/2022    Chief Complaint: shortness of breath    ICU consulted for: airway watch    History of Present Illness:  Hunter Higgins is a 72 y.o. male with ESRD on MWF, CAD on medical management that is not amenable to CABG or stenting, DMT2, diastolic heart failure presenting to the ED with shortness of breath that started yesterday morning.  He states that he went to dialysis yesterday, had a full run.  Was initially feeling better.  Symptoms recurred last night into this morning.  He went to his PCP this morning.  Sent to emergency department from PCP due to concern for stridor and low oxygen saturation.  Arrived in no acute distress, stable on nasal cannula.  Had some intermittent stridor that progressively worsened.  Scoped by ENT with evidence of limited mobility of vocal cords.  Given Decadron, racemic epi.  Evaluated by  laryngoscopy by ENT. Recommended for cricothyrotomy if needed for airway, ENT would prefer to do this in the OR possible. Noted to have poor motility of vocal cords with narrowing.     ICU consulted due to concern for airway watch. He denied any other signs or symptoms including any viral symptoms, including sore throat, cough and congestion.  He has a large mass on the posterior neck/upper back has been evaluated and diagnosed as a lipoma in the past.  Otherwise he has no complaints today including no chest pain, no fevers or chills.  Started Trulicity over the weekend but otherwise has not introduced any new medications. No rashes or itching to suggest allergic reaction.    Brief Hospital Course:   1/9 admitted to ICU for airway watch    Neuro   GCS: 15  Exam: A&O4, following commands    Assessment and Plan:   NAI, pain management as necessary    Pulmonary   On 4L NC    Exam: biphasic  stridor, more prominent on inspiration; initially tripoding per ED, but now more comfortable appearing, speaking in 4-5 word sentences at a time    Pertinent Imaging:   - CXR 08/23/22:  1.  Hazy opacification at the left lung base, probably atelectasis or pneumonia.     Medications/Nebs:   - racemic epinephrine    Assessment and Plan:   # biphasic stridor - undifferentiated etiology at this time, possibly related to posterior neck mass (malignancy) vs laryngitis vs tracheobronchial amyloidosis; no evidence of peritonsillar abscess, but will need further assessment for this and possible retropharyngeal abscess with CT  ENT following   CT Neck/chest with contrast and CT head w/o contrast once able to lay flat  Racemic epinephrine in the meantime  Supportive care  If patient requires airway - 6.0 ETT recommended per ENT on laryngoscopy    Cardiovascular    Temp (24hrs), Avg:98.2 F (36.8 C), Min:98.2 F (36.8 C), Max:98.2 F (36.8 C)  Temp: 98.2 F (36.8 C), Heart Rate: 72, BP: 125/49, Resp: 10, SpO2: 96 %  Systolic (10RUE), AVW:098 , Min:122 , JXB:147   Diastolic (82NFA), OZH:08, Min:49, Max:70    Exam:  RRR, NS1S2, no m/r/g; R TDC  Peripheral pulses palpable in upper and lower ext; good cap refill  1+ peripheral edema BLE    Assessment and Plan:   NAI  Renal/GU/Electrolytes     Basic Metabolic Profile    Recent Labs     08/23/22  1105   NA 141   POTASSIUM 5.3   CHLORIDE 97*   CO2 23   BUN 47*   CREAT 11.3*   GLUCOSE 127*    Recent Labs     08/23/22  1105   CALCIUM 9.4       Assessment and Plan:   Electrolyte replacement protocol  ESRD on HD via R [facility] MWF - [facility]    GI   Exam: soft, nontender, nondistended    Diet/tube feeding: NPO    Meds/Bowel regimen: senna, miralax prn  GI ppx: pepcid    Assessment and Plan:   NPO for now pending improvement of respiratory status    Hematology   DVT ppx: SQH q8h  Blood products administered: n/a    Pertinent labs:   Recent Labs     08/23/22  1354  08/23/22  1105   WBC 6.6 5.1   RBC 4.41 4.65   HEMOGLOBIN 10.0* 10.6*   HCT 32.7* 34.8*   MCV 74* 75*   MCH 23* 23*   MCHC 31 31   RDW 17.0* 17.2*   PLATELET 175 184   MPV 10.1 9.8     Recent Labs     08/23/22  1354 08/23/22  1105   SEGS  --  50   LYMPHOCYTES  --  35   MONOS  --  11   EOS  --  3   BASOS  --  1   RDW 17.0* 17.2*     Recent Labs     08/23/22  1354   PT 11.3   INR 1.02   APTT 26     Assessment and Plan:   # chronic microcytic anemia, stable - in setting of chronic renal insufficiency  Stable from prior    ID   Temp (24hrs), Avg:98.2 F (36.8 C), Min:98.2 F (36.8 C), Max:98.2 F (36.8 C)  Temp Current: Temp: 98.2 F (36.8 C)     Antimicrobials: unasyn day 1  Cultures: blood    Assessment and Plan:   # unclear etiology of stridor, possibly viral?  Send RVP  Cover with unasyn until CT completed  Blood cultures    Endocrine     No results for input(s): "GLUPOC" in the last 48 hours.    Assessment and Plan:   Glucose goal 140-180. Currently on SSI    Global   Code status: Full   Family: Daughter, Jovita Gamma    PMH, PSH, Social Hx, Family Hx, Meds and Allergies:     Past Medical History:   Diagnosis Date    Abnormal nuclear stress test     Chronic diastolic (congestive) heart failure (HCC)     Chronic kidney disease, stage IV (severe) (HCC)     Diabetic foot ulcer ([place])     Diabetic retinopathy ([place])     DM2 (diabetes mellitus, type 2) ([place])     Essential (primary) hypertension     GERD (gastroesophageal reflux disease)     Male erectile dysfunction, unspecified     Peripheral arterial disease (HCC)     Prostate cancer ([place])     Sarcoidosis, unspecified     In remission    SBO (small bowel obstruction) ([place])     Vitreous hemorrhage ([place])      Past Surgical History:   Procedure Laterality Date    EXPLORATORY LAPAROTOMY  Lysis of adhesions    HEART CATHETERIZATION  12/30/2020      HERNIA REPAIR      Inguinal    ORTHOPEDIC SURGERY      Shoulder    RADICAL PROSTATECTOMY      VENOUS ACCESS CATHETER  INSERTION N/A 12/28/2020    Procedure: INSERTION, CATHETER, TUNNELED, FOR DIALYSIS;  Surgeon: Ned Grace, MD    VENOUS ACCESS CATHETER REPLACEMENT N/A 04/09/2021    Procedure: REPLACEMENT, CATHETER, HEMODIALYSIS, TUNNELED;  Surgeon: Doris Cheadle, Memory Argue, MD     Social History     Tobacco Use    Smoking status: Former    Smokeless tobacco: Never   Vaping Use    Vaping Use: Never used   Substance Use Topics    Alcohol use: Yes     Comment: Occasional social    Drug use: Never     Family History   Problem Relation Age of Onset    Diabetes Mother      No current facility-administered medications on file prior to encounter.     Current Outpatient Medications on File Prior to Encounter   Medication Sig Dispense Refill    acetaminophen (TYLENOL) 325 mg PO TABS Take 2 Tabs by Mouth Every 4 Hours As Needed for Fever or Pain.      amLODIPine (NORVASC) 5 mg PO TABS Take 1 Tab by Mouth Once a Day.      aspirin 81 mg PO CHEW Take 1 Tab by Mouth Once a Day.      atorvastatin (LIPITOR) 20 mg PO TABS Take 1 Tab by Mouth Every Night at Bedtime.      b complex-vitamin c-folic acid (NEPHRO/TRIPHROCAPS) 1 mg PO CAPS Take 1 Cap by Mouth Once a Day.      carbamide peroxide (DEBROX) 6.5 % OT Drop Instill 5 Drops in both ears Twice Daily. 30 mL 0    clopidogreL (PLAVIX) 75 mg PO TABS Take 1 Tab by Mouth Once a Day.      famotidine (PEPCID) 20 mg PO TABS Take 1 Tab by Mouth Once a Day.      furosemide (LASIX) 80 mg PO TABS Take 1 Tab by Mouth 2X Daily Diuretic.      gabapentin (NEURONTIN) 300 mg PO CAPS Take 1 Cap by Mouth 3 Times Daily.      hydrALAZINE (APRESOLINE) 100 mg PO TABS Take 1 Tab by Mouth 3 Times Daily.      insulin LISPRO (HUMALOG VIAL) 100 unit/mL SC SOLN Inject 1-6 Units beneath the skin 4 Times a Day Before Meals & at Bedtime.      isosorbide mononitrate CR (IMDUR) 60 mg PO TB24 Take 1 Tab by Mouth Once a Day.      melatonin 3 mg PO TABS Take 1 Tab by Mouth nightly as needed (insomnia).      nitroglycerin (NITROSTAT) 0.4  mg SL SUBL Dissolve 1 Tab under tongue Every 5 Minutes as Needed.      ondansetron (ZOFRAN) 4 mg PO ODT. Take 1 Tab by Mouth Every 8 Hours As Needed for Nausea (PRN FOR NAUSEA AND VOMITING). ALLOW TABLET TO DISSOLVE ON TONGUE. 12 Tab 0    tamsulosin (FLOMAX) 0.4 mg PO CAPS Take 1 Cap by Mouth Daily After supper.      torsemide (DEMADEX) 20 mg PO TABS Take 5 Tabs by Mouth Once a Day.      traZODone 150 mg PO TABS Take 50 mg by Mouth.  Allergies   Allergen Reactions    Naproxen gi distress     Review of Systems:   Positive Items Bolded   Constitutional: fevers, chills, fatigue, weight loss   Eyes: blurry vision, visual change  ENT: sore throat, runny nose, epistaxis  Cardiovascular: chest pain, orthopnea, PND, palpitations, dyspnea  Repiratory: cough, wheezing, sputum production  Gastrointestinal: abdominal pain, nausea, vomiting, diarrhea  Genitourinary: dysuria, frequency, polyuria  Musculoskeletal: pain, joint swelling, crepitus  Skin: rash, itching, wounds  Neurological: headache, numbness, tingling, weakness  Psychiatric: depression, anxiety, hallucinations  Endocrine: heat or cold intolerance  Hematologic: anemia, petechial, easy bruising, easy bleeding  Allergy: runny nose, itchy eyes    Objective:    VITALS:  BP  Min: 122/64  Max: 159/70  Temp  Avg: 98.2 F (36.8 C)  Min: 98.2 F (36.8 C)  Max: 98.2 F (36.8 C)  Pulse  Avg: 80.5  Min: 77  Max: 84  Heart Rate  Avg: 79.7  Min: 72  Max: 87  SpO2  Avg: 98 %  Min: 96 %  Max: 99 %  Systolic (54YHC), WCB:762 , Min:122 , GBT:517   Diastolic (61YWV), PXT:06, Min:49, Max:70    Admission Weight: 70.3 kg (154 lb 15.7 oz)    Current Weight: 70.3 kg (154 lb 15.7 oz)    Height: 5\' 7"  (170.2 cm)   Body mass index is 24.27 kg/m.@GFRCG @    Invasive Lines:     Patient Lines/Drains/Airways Status       Active Peripheral Venous Line / Central Venous Line / Arterial Line / Left Atrial Line / Epidural Line / Airway / Subcutaneous Line / Drain / PIV Line / Intraosseous Line        Name Placement date Placement time Site Days Last dressing change    PIV: 08/23/22 1105 20 gauge Forearm Anterior;Proximal;Right 08/23/22  1105  Forearm  less than 1     Central Line: 12/21/21 1047 Internal jugular Right Dialysis cuffed 12/21/21  1047  Internal jugular  245                Medication     IV Drips    Scheduled Medications  lidocaine, 5 mL, Once  ondansetron (PF), 4 mg, Once  racEPINEPHrine, 0.5 mL, Once    Miguel Aschoff, MD PGY-3  [facility] Emergency Medicine  [facility] Critical [facility]  GICU Resident Phone [phone]  GICU Resident Pager [phone]      Electronically signed by Ezequiel Essex, MD at 08/23/2022  5:43 PM EST    Associated attestation - Ezequiel Essex, MD - 08/23/2022  5:43 PM EST  Formatting of this note is different from the original.  Attending Attestation    I have seen and examined Hunter Higgins and reviewed all laboratory and clinical data.  I have reviewed the fellow/resident/team's evaluation and management plan. The plan of care is based on my assessment.    My Assessment and Plan:   72 y.o. male with CAD, ESRD presented with SOB and stridor. Noted to have poor bil VF abduction and infraglottic edema on ENT scope. Improvement in symptoms, stridor, and edema this afternoon with antihistamines, racemic epi, and decadron.   Admit to ICU for airway watch  Large palpable posterior neck mass - present for years per patient; CT in 2022 suggested probable lipoma but could not exclude liposarcoma. Agree with repeat imaging.     The patient continues to be critically ill and unstable.      Freddie Breech  Camila Li, MD  [facility] Pulmonary and Critical Care Medicine  Pulmonary Consult Pager [phone]  Critical Care Service Pager [phone]  Pulmonary Clinic [phone]  08/23/2022, 5:37 PM

## 2022-08-23 NOTE — Progress Notes (Signed)
Formatting of this note might be different from the original.  Patient complaining of wheezing and it being a little harder to breathe. Notified MD, racemic epi ordered and is currently being administered. Blood cultures, labs, and RPP walked to labs. Patient not stable enough at this time to go to xray or to change to hospital bed. ABX started as well. TDC dressing changed to hospital compliant dressing.  Electronically signed by Hoover Brunette, RN at 08/23/2022  6:49 PM EST

## 2022-08-23 NOTE — ED Notes (Signed)
Formatting of this note might be different from the original.  Medications ordered to give medications per [facility]. Per EMT will continue to watch patient.     Patient able to talk in complete sentences. States to be SOB. Expiratory and inspiratory wheezing. Placed on 2L NC.     Patient medicated per MAR. Medications verified and checked.   Patients ID and allergies were verified with patient and cross checked with ID band prior to administration.   Patient verbalized understanding of medications being administered.      Electronically signed by Johnnette Barrios, RN at 08/23/2022  1:37 PM EST

## 2022-08-23 NOTE — Progress Notes (Signed)
Formatting of this note might be different from the original.  Attempted xray @ 1836, Nurse said pt having test at this time, Nurse will cal when pt is ready, nurse must accompany pt to xray. Tech [phone].   Electronically signed by Marcelene Butte at 08/23/2022  6:38 PM EST

## 2022-08-23 NOTE — Progress Notes (Signed)
Formatting of this note might be different from the original.  [facility] Otol[facility] - Head and Neck Surgery   Update Note    Re-evaluated again this evening. Has mild return of inspiratory stridor. Able to lay flat on 4L NC. Mildly hoarse voice. Has some subjective worsening shortness of breath with laying flat.     Plan:   - Remain NPO  - Hold off scans for now until able to lay flat comfortably  - Continue steroids, rac epi   - If any concerns for respiratory decompensation, please call ENT. Would take Code 1 to OR for intubation/airway control.   Electronically signed by Kathrynn Ducking, MD,RES at 08/23/2022  6:18 PM EST

## 2022-08-23 NOTE — Progress Notes (Addendum)
1. "Have you been to the ER, urgent care clinic since your last visit?  Hospitalized since your last visit?" No    2. "Have you seen or consulted any other health care providers outside of the [facility] since your last visit?" No     3. For patients aged 74-75: Has the patient had a colonoscopy / FIT/ Cologuard? No      If the patient is male:    4. For patients aged 40-74: Has the patient had a mammogram within the past 2 years? NA - based on age or sex      5. For patients aged 21-65: Has the patient had a pap smear? NA - based on age or sex

## 2022-08-23 NOTE — Telephone Encounter (Signed)
This is being addressed in another telephone encounter.

## 2022-08-23 NOTE — ED Provider Notes (Signed)
Formatting of this note is different from the original.  ED Resident Supervision Note    I have seen and evaluated Hunter Higgins and agree with the provider?s findings (exceptions, if any, noted below).  I reviewed and directed the Emergency Department treatment given to Hunter Higgins     A/P:  72 yo m with hx esrd on HD p/w sob. Recent orthopnea and cough. Noted wheezing at home. Found to have stridor here that progressed during ER stay, started on steroids, ENT consulted to scope, admit to ICU for airway watch and risk of needing intubation    Critical Care Time:  35 mins    Procedures:      S:  72 y.o. male  with a chief complaint of RESPIRATORY PROBLEM and [place]:  Patient Vitals for the past 72 hrs:   Temp Heart Rate Resp BP BP Mean SpO2 Weight   08/23/22 1010 98.2 F (36.8 C) 87 16 159/70 100 MM HG 99 % 70.3 kg (154 lb 15.7 oz)         Electronically signed by Gwenevere Abbot, MD at 09/12/2022 11:14 PM EST

## 2022-08-23 NOTE — ED Notes (Signed)
Formatting of this note might be different from the original.  Pt up on side of bed standing stating that he cannot breath; pt is speaking in clear sentences. Has steady gait. O2 sats wnl on RA.  Bilat lung fields has some inspiratory wheeze/strider.    Note that pt did have full HD tx yesterday and felt well/. Reports that around 0300 today felt sob; denies fevers or chills  Electronically signed by Juanell Fairly, RN at 08/23/2022 12:39 PM EST

## 2022-08-23 NOTE — ED Notes (Signed)
Formatting of this note is different from the original.     08/23/22 1110   Lab Draw   Lab Draw Time 1105   Lab Sent Time 1110   Lab Draw Site Right;Antecubital   Lab Draw Device Angiocatheter   Device Gauge 20   # of Lab Draw Attempts 1 Attempt   Labs Drawn with IV Start Yes   Lab Tubes Rainbow;Pink       Electronically signed by Rebeca Allegra at 08/23/2022 11:11 AM EST

## 2022-08-23 NOTE — Consults (Signed)
Formatting of this note is different from the original.               [facility] Attending Consultation Note                                [facility], Department of Otolaryngology Head&Neck Surgery  Assessment:     72 y.o. male with laryngeal edema, likely in the setting of URI. 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 After decadron his larynx is much improved and his breathing much more comfortable.    Plan:     1) I have reviewed pertinent diagnostic studies, and discussed the differential dx, work-up, and treatment with Otolaryngology resident Dr. Lyndel Pleasure.  I approved the plan in their separately dictated consult note.  2) When patient has been stable from airway perspective, can start clears. ENT will re-evaluate  3) When patient is stable from airway perspective, CT neck with contrast, CT chest with contrast, possible brain imaging.  4) After imaging, may need gen surg consultation re: the posterior neck / back soft tissue mass  5) Note from history patient may have a history of sarcoid as well as prostate cancer, will benefit from pulmonary involvement  6) No ENT plans for surgery at this time. If patient were to worsen from airway perspective please call ENT to evaluate the airway.    Identification:   Chief Complaint: This 72 y.o. male , evaluated 08/23/22, on presentation for stridor.    Interval Changes:   After Decadron patient is markedly better.  I evaluated him both for and after the Decadron dosing and we discussed possible tracheostomy if he was not improving on Decadron but he did improve on the Decadron and he is now able to breathe comfortably.  He also got a dose of racemic epinephrine..    ROS, PMH, PSH, FHx, SHx taken by the resident staff Dr. Lyndel Pleasure on consultation were reviewed and confirmed with the patient.    Objective:     Vitals:    08/23/22 1200 08/23/22 1300 08/23/22 1354 08/23/22 1400   BP: 125/49 130/53 158/59 157/57   Pulse:  77 79 82 84   Resp: 10 13 14 14    Temp:       SpO2: 96% 96% 99% 90%   Weight:       Height:           I examined the patient and confirmed the Otolaryngology resident's exam.    KEY EXAM FINDINGS:  HEENT: stridor is improved after decadron, now minimal inspiratory stridor, large posterior neck / back soft tissue mass ballotable but creating skin tension, CN II-XII intact, scope exam shows improvement in glottic edema but still with some decreased VF abduction bilateral.  General: Alert and oriented x 3, breathing improved, not labored.  Chest: Moves symmetrically without retractions  CV: Regular rate and rhythm  GI: Abdomen soft and nontender/nondistended  Extremities: wwp.    Electronically signed by Sabino Dick, MD at 08/23/2022  3:24 PM EST

## 2022-08-24 NOTE — Progress Notes (Signed)
Formatting of this note might be different from the original.  Received patient in ER for urgent HD. Is alert and oriented x 4. Machine tested and passed. HD orders verified.   Consent signed. K level is 7.6. Patient is on special K bath.  Prime given. HD initiated at 0935. PPE use including gown, face shields, surgical face masks, hand gloves.  Plan is to remove  2-3 Liters for 3 hours.  Electronically signed by Nicanor Bake, RN at 08/24/2022  9:46 AM EST

## 2022-08-24 NOTE — Progress Notes (Signed)
Formatting of this note might be different from the original.  Patient able to tolerate the 3 hours of HD tx and able to remove 1.8 Liters UF net. Total of 2.3 L. HD complicated with hypotension. Blood rinsed back tolerated well. HD completed at 1240. Catheter care performed. Lines Heparin locked with 1.5 cc to each port,clamped and capped with new red curos caps. HD report given to Ms. Ward RN.  Electronically signed by Nicanor Bake, RN at 08/24/2022  1:30 PM EST

## 2022-08-24 NOTE — Progress Notes (Signed)
Formatting of this note is different from the original.  General Progress Note - Service Date: 08/24/2022   [facility] - Admission Date: 08/23/2022     Assessment & Plan       26 y. Old male with h/o ESRD, CAD, HTN, peripheral neuropathy, diastolic HF, PAD, DM, was admitted with SOB, stridor. Evaluated by ENT team, with concerns of significant glottic/infraglottic edema, with bilateral poor abduction of the vocal cords. Pt was diagnosed with RSV infection. Observed in ICU. Initiated on steroids. Symptoms improved. Being transferred to SDU.     A/P:    1. Bilateral poor abduction of vocal cords with glottic/infraglottic swelling - likely due to infection. C/w steroids. Empiric ATB's. ENT team input.     2. H/o DM - on Trulicity. Monitor glucose readings and insulin dose adjustment per glucose readings.     3. End stage renal disease - on HD M/W/F. S/p HD this am. Noted nephrology team input.     4. Hyperkalemia - prior to HD. Resolved with HD. Monitor.     5. CAD - noted prior cardiac cath report. Recommended medical TX. Per pt most of med's were discontinued at dialyze's unit, likely due to low BP with HD, but it seems pt did stop ASA along with other BP med's. He ran out prescription of Lipitor and did not renew. With known h/o CAD, consider to resume of ASA, statins. Per pt he has started OTC ASA last couple days.     6. Peripheral neuropathy - on gabapentin.     7. Lipoma - neck area. Per pt noted some increase in size, but has had long term and no significant changes.     PT eval/TX.   Discharge planning - with improvement of the symptoms transition home.     Subjective     Chief Complaint:  SOB    Interval Events:    Feels better. Afebrile. No CP.     Review of Systems   Constitutional:  Positive for fatigue.   Respiratory:  Negative for chest tightness.    Cardiovascular:  Negative for leg swelling.   Gastrointestinal:  Negative for abdominal pain, nausea and vomiting.   Genitourinary:   Negative for dysuria.   Neurological:  Negative for dizziness.   Psychiatric/Behavioral:  Negative for confusion.      Objective     Vital Signs:  BP: 159/88  Heart Rate: 65 (08/24/22 1830)  Pulse: 65 (08/24/22 1830)  Temp: 98.1 F (36.7 C)  Resp: 16  Height: 67" (170.2 cm)  Weight: 70.3 kg (154 lb 15.7 oz)  BMI (Calculated): 24.27  SpO2: 100 %  Flow (L/min) (Oxygen Therapy): 2  Temp (24hrs), Avg:98 F (36.7 C), Min:97 F (36.1 C), Max:98.6 F (37 C)      Intake/Output Summary (Last 24 hours) at 08/24/2022 1903  Last data filed at 08/24/2022 1240  Gross per 24 hour   Intake 600 ml   Output 1800 ml   Net -1200 ml     Physical Exam  Constitutional:       Comments: No apparent distress   HENT:      Head: Normocephalic.   Eyes:      Conjunctiva/sclera: Conjunctivae normal.   Neck:      Comments: Posterior neck - subcutaneous lipoma. Non tender.   Cardiovascular:      Rate and Rhythm: Regular rhythm.   Pulmonary:      Effort: Pulmonary effort is normal.  Comments: Few scattered wheezes, rhonchi    Abdominal:      General: There is no distension.      Palpations: Abdomen is soft.      Tenderness: There is no abdominal tenderness.   Musculoskeletal:      Cervical back: Neck supple.      Right lower leg: No edema.      Left lower leg: No edema.   Neurological:      General: No focal deficit present.      Mental Status: He is oriented to person, place, and time.   Psychiatric:         Mood and Affect: Mood normal.       I have reviewed the following pertinent result(s).  All labs in past 24hrs:   Results for orders placed or performed during the hospital encounter of 08/23/22 (from the past 24 hour(s))   GLUCOSE SCREEN   Result Value Ref Range    Glucose POC 212 (H) 70 - 99 mg/dL   BASIC METABOLIC PANEL   Result Value Ref Range    Potassium 7.6 (HH) 3.5 - 5.5 mmol/L    Sodium 135 133 - 145 mmol/L    Chloride 98 98 - 110 mmol/L    Glucose 208 (H) 70 - 99 mg/dL    Calcium 9.2 8.4 - 10.5 mg/dL    BUN 72 (H) 6 -  22 mg/dL    Creatinine 13.0 (H) 0.8 - 1.6 mg/dL    CO2 22 20 - 32 mmol/L    eGFR 3.8 (L) >60.0 mL/min/1.73 sq.m.    Anion Gap 15.0 3.0 - 15.0 mmol/L   GLUCOSE SCREEN   Result Value Ref Range    Glucose POC 149 (H) 70 - 99 mg/dL   CBC   Result Value Ref Range    WBC 11.4 (H) 4.0 - 11.0 K/uL    RBC 5.34 3.80 - 5.80 M/uL    HGB 12.0 (L) 12.6 - 17.1 g/dL    HCT 39.0 37.8 - 52.2 %    MCV 73 (L) 80 - 95 fL    MCH 23 (L) 26 - 34 pg    MCHC 31 31 - 36 g/dL    RDW 16.6 (H) 10.0 - 15.5 %    Platelet 207 140 - 440 K/uL    MPV 10.0 9.0 - 13.0 fL   GLUCOSE SCREEN   Result Value Ref Range    Glucose POC 267 (H) 70 - 99 mg/dL     Medical Decision Making     Complexity  Category 1:  I have reviewed external notes.  I have reviewed the patient's labs.    I have discussed with an independent historian.    Category 3:  I have discussed the patient's care with a health care provider.The provider was ICU team, caring nurse.    Total time spent on this patient today: 50 minutes.    Checklist     Code Status: FULL       Pharmacologic VTE Prophylaxis (From admission, onward)      Ordered     Ordering Provider      Wed Aug 24, 2022  4:42 AM    08/24/22 0442  heparin injection 1,000 Units  AFTER EVERY DIALYSIS         Ronald Lobo, MD      Tue Aug 23, 2022  1:52 PM    08/23/22 1352  heparin injection 5,000 Units  EVERY 8 HOURS (0600,1400,2200)  Wynelle Link, MD,RES         Mechanical VTE Orders (From admission, onward)       Ordered     Start    08/23/22 1352  Sequential Compression Devices (SCDs)  UNTIL DISCONTINUED        Comments: To be worn a minimum of 18 hours/day while in bed or chair. Foot pumps may be applied if correct fit cannot be achieved with SCDs.    08/23/22 1347               Patient Lines/Drains/Airways Status       Active Peripheral Venous Line / Central Venous Line / Arterial Line / Left Atrial Line / Epidural Line / Airway / Subcutaneous Line / Drain / PIV Line / Intraosseous Line       Name Placement date  Placement time Site Days Last dressing change    PIV: 08/23/22 1105 20 gauge Forearm Anterior;Proximal;Right 08/23/22  1105  Forearm  1     PIV: 08/23/22 1800 22 gauge Hand Posterior;Right 08/23/22  1800  Hand  1     PIV: 08/24/22 1413 20 gauge Forearm Left;Posterior;Proximal 08/24/22  1413  Forearm  less than 1     Central Line: 12/21/21 1047 Internal jugular Right Dialysis cuffed 12/21/21  1047  Internal jugular  246 08/23/22 1837 (24.44 hrs)               IP ANTIINFECTIVES (From admission, onward)       Start     Ordered Stop    08/23/22 1900  ampicillin/sulbactam (Unasyn) 3 g in NS 100 mL IVPB  3 g,   Intravenous,   EVERY 24 HOURS         08/23/22 1704 08/28/22 1859                   Leane Platt, MD  08/24/2022, 7:03 PM    Electronically signed by Leane Platt, MD at 08/25/2022 12:19 AM EST

## 2022-08-24 NOTE — Progress Notes (Signed)
Formatting of this note is different from the original.  [facility]  Progress Note  Admit Date: 08/23/2022    HD: 2  Antibiotics: Unasyn    VTE Prophylaxis: Heparin   Diet: CLD   Lines/Drains: PIV  Fluids/Gtt: None      SUBJECTIVE:  Felt like he worsened around 3 Am yesterday, but overall improved again now.Felt subjectively worse after rac epi administration. Able to lay flat comfortably and was sleeping some of the night.     OBJECTIVE:  Temp (24hrs), Avg:97.8 F (36.6 C), Min:96.6 F (35.9 C), Max:98.2 F (36.8 C)    Temp: 98.2 F (36.8 C)  Pulse: 57  BP: 149/53  Resp: 14  SpO2: 100 % on 4L NC   HTN to 170s/80s    Telemetry: [place] overnight     Intake/Output Summary (Last 24 hours) at 08/24/2022 0837  Last data filed at 08/23/2022 1814  Gross per 24 hour   Intake 100 ml   Output --   Net 100 ml     UOP(24hr): uncharted    Physical Exam:  General: NAD, resting comfortably in bed  HEENT: Stable left posterior neck mass; No stridor. Able to lay flat comfortably.   Cardiovascular: RRR, 2+ radial pulses  Pulmonary: Chest rise symmetric, normal WOB  Abdominal: Soft, non-distended, non-tender  Extremities: Warm and well perfused.     Flexible Fiberoptic Laryngoscopy:   After obtaining verbal consent, the left nasal passage(s) were prepared with a topical mixture of 4% topical lidocaine and 0.05% oxymetazoline. Next, the flexible endoscope was inserted into the left nare, which was grossly normal. Nasopharynx grossly normal without mass or lesion. Posterior pharynx symmetric without mass or lesion. The base of tongue is normal and symmetric. Vallecula is clear. The epiglottis and aryepiglottic folds are crisp and without mass or lesion. The supraglottic inlet is unobstructed. There is bilateral poor abduction of the vocal cords and glottic/infraglottic swelling, though improved from prior 2 laryngoscopies.The piriform sinuses are clear and without pooling of secretions.      Findings:    - Bilateral poor abduction of vocal cords   - Improved glottic/infraglottic swelling, but still fairly narrow     Labs:  CBC w/Diff   Recent Labs     08/23/22  1354 08/23/22  1105   WBC 6.6 5.1   HEMOGLOBIN 10.0* 10.6*   HCT 32.7* 34.8*   PLATELET 175 184   MCV 74* 75*   SEGS  --  50   LYMPHOCYTES  --  35   MONOS  --  11   EOS  --  3   BASOS  --  1   RDW 17.0* 17.2*       Basic Metabolic Profile   Recent Labs     08/23/22  1105   NA 141   POTASSIUM 5.3   CHLORIDE 97*   CO2 23   BUN 47*   CREAT 11.3*   GLUCOSE 127*   CALCIUM 9.4       Glucose   Recent Labs     08/23/22  2207 08/23/22  1105   GLUCOSE  --  127*   GLUPOC 212*  --        Imaging(24hr):  CT Head  Impression     No acute intracranial abnormality.   Mild periventricular chronic microvascular ischemic disease.   Mild generalized volume loss.   Mild chronic ethmoid sinusitis.       Dictated By: Rayburn Felt on  08/23/2022 9:39 PM     As attending radiologist, I have assessed the study images, and dictated or reviewed/edited the final report as needed.     Signed By: Peterson Lombard, MD on 08/23/2022 10:25 PM   CT Neck   Impression     1.Symmetric slitlike narrowing of the airway at the level of the true vocal cords, may represent glottic stenosis. No supporting enlargement of the piriform sinuses. No nodular masslike regions. No significant inflammation.   2. Mild chronic ethmoid sinusitis .   3. Severe cervical degenerative disc disease..     Dictated ByRayburn Felt on 08/23/2022 9:52 PM     As attending radiologist, I have assessed the study images, and dictated or reviewed/edited the final report as needed.     Signed By: Peterson Lombard, MD on 08/23/2022 10:32 PM   CT Chest   See separate dedicated CT neck report.       1. Perihilar and mediastinal calcified lymphadenopathy, likely sequela of granulomatous disease.   2. Large lipoma at the left upper back.   3. Hepatic steatosis.   4. 1.2 cm cystic lesion in the pancreas, likely IPMN. Recommend nonemergent  MRI with and without contrast on a nonemergent outpatient basis for better characterization.   5. Renal osteodystrophy.     Dictated ByRayburn Felt on 08/23/2022 9:59 PM     As attending radiologist, I have assessed the study images, and dictated or reviewed/edited the final report as needed.     Signed By: Peterson Lombard, MD on 08/23/2022 10:19 PM     ASSESSMENT:  Hunter Higgins is a 72 y.o. male w/ complex PMH including CAD, ESRD on HD with worsening SOB and inspiratory stridor with FFL notable for poor VF abduction and significant glottic/infraglottic edema with gradual improvement with Decadron though return of symptoms ~3h prior to re-dose.     PLAN:    Continue Decadron (Rac epi for comfort but reportedly worse after tx, so consider holding off as likely non-contributory to improvement)   Continue CLD  If any worsened respiratory symptoms, patient responded well to steroids + rac epi earlier. If urgent distress requiring intubation, can be transorally intubated by anesthesia with 6.0 ETT. If there is worsened symptoms, would prefer however to take patient Code 1 to OR for airway management.   Further plan pending discussion with Dr. Jen Mow, MD  [place]    Attending: Dr. Sabino Dick    Electronically signed by Sabino Dick, MD at 08/25/2022  5:31 PM EST

## 2022-08-24 NOTE — ED Notes (Signed)
Formatting of this note might be different from the original.  HD at bedside  Electronically signed by Juanell Fairly, RN at 08/24/2022  9:32 AM EST

## 2022-08-24 NOTE — ED Notes (Signed)
Formatting of this note might be different from the original.  racEPINEPHrine  per [facility]   Electronically signed by Lelon Frohlich, RN at 08/24/2022  6:45 AM EST

## 2022-08-24 NOTE — ED Notes (Signed)
Formatting of this note might be different from the original.  Dialysis at bedside. Pt resting eyes closed in hospital bed. NAD noted at this time.   Electronically signed by Lorri Frederick, RN at 08/24/2022  1:21 PM EST

## 2022-08-24 NOTE — Progress Notes (Signed)
Formatting of this note might be different from the original.  PCCM Night Fellow Progress Note     Reviewed day team documentation. Evaluated patient bedside and with nursing. Patient in [place]. No concerns with bradycardia. Patient resting comfortably. No changes in plan.       Loel Lofty   PCCM Fellow PGY5   Electronically signed by Janan Ridge, MD,RES at 08/24/2022  5:39 AM EST

## 2022-08-24 NOTE — Progress Notes (Signed)
Formatting of this note is different from the original.  Images from the original note were not included.        [facility]  Daily Progress Note     Admission Date: 08/23/2022     CODE Status: Full Code    Patient Description:   Hunter Higgins is a 72 y.o. male  with PMH of ESRD, CAD, V9DG, diastolic heart failure presenting to the ED with shortness of breath & stridor. On ENT evaluation was found to have FFL notable for poor bilateral VF abduction and significant infraglottic edema. Admitted to the ICU for airway watch  New Issues (Last 24 Hours):   [place] [facility] Course:   Admitted on 08/23/22 to the ICU for airway watch  Endorses initial improved symptoms but then felt he had difficulty breathing last night, ehich is improved now    SUBJECTIVE:    Today, pt Endorses chest congestion, cough, sore throat    Consults:   ENT     Neuro   GCS: 15  Exam: A&O4, following commands    Assessment and Plan:   NAI, pain management as necessary    Pulmonary     On 2L NC    Exam: CTAB. No wheezes, rales, or rhonchi.    Pertinent Imaging:   - CXR 08/23/22:  1.  Hazy opacification at the left lung base, probably atelectasis or pneumonia.   CT chest Perihilar and mediastinal calcified lymphadenopathy, likely sequela of granulomatous disease. Large lipoma at the left upper back.     Medications/Nebs:   - racemic epinephrine prn  - Decadron     Assessment and Plan:   # biphasic stridor - undifferentiated etiology at this time, possibly related to posterior neck mass (malignancy) vs laryngitis vs tracheobronchial amyloidosis; no evidence of peritonsillar abscess, but will need further assessment for this and possible retropharyngeal abscess with CT  ENT following   Racemic epinephrine   Supportive care  If patient requires airway - 6.0 ETT recommended per ENT on laryngoscopy    Cardiovascular      Temp (24hrs), Avg:97.8 F (36.6 C), Min:96.6 F (35.9 C), Max:98.2 F (36.8 C)  Temp: 98.2 F (36.8 C), Heart  Rate: 57, BP: 149/53, Resp: 14, SpO2: 387 %  Systolic (56EPP), IRJ:188 , Min:119 , CZY:606   Diastolic (30ZSW), FUX:32, Min:46, Max:100      Exam:  RRR, NS1S2, no m/r/g  Peripheral pulses palpable in upper and lower ext; good cap refill  No peripheral edema    Assessment and Plan:   Hypetension :  - Not on any meds at home   - Will hold off on starting any meds now as patient is due for HD    Renal/GU/Electrolytes     Basic Metabolic Profile    Recent Labs     08/23/22  1105   NA 141   POTASSIUM 5.3   CHLORIDE 97*   CO2 23   BUN 47*   CREAT 11.3*   GLUCOSE 127*    Recent Labs     08/23/22  1105   CALCIUM 9.4       Assessment and Plan:   Electrolyte replacement protocol  ESRD on HD via [facility] MWF - [facility]    GI     Exam: soft, nontender, nondistended    Diet/tube feeding: NPO    Meds/Bowel regimen: senna, miralax prn  GI ppx: pepcid    Assessment and Plan:  NPO for now pending improvement of respiratory status      Hematology   DVT ppx: SQH  Blood products administered: n/a    Assessment and Plan:   Anemia:  - Microcytic anemia   []  Iron profile    ID     Temp (24hrs), Avg:97.8 F (36.6 C), Min:96.6 F (35.9 C), Max:98.2 F (36.8 C)    Temp Current: Temp: 98.2 F (36.8 C)     Antimicrobials: unasyn day 1  Cultures: blood CX (1/9)    Assessment and Plan:   # unclear etiology of stridor, possibly viral?  RSV +ve   Empirical coverage with unasyn  Blood cultures    Endocrine     Bedside Glucose     08/23/22  2207   GLUPOC 212*       Assessment and Plan:   T2DM :  - Insulin lantus on hold, SSI  Glucose goal 140-180.     Global   Code status: Full     Objective:    VITALS:  BP  Min: 119/100  Max: 178/57  Temp  Avg: 97.8 F (36.6 C)  Min: 96.6 F (35.9 C)  Max: 98.2 F (36.8 C)  Pulse  Avg: 80  Min: 56  Max: 101  Heart Rate  Avg: 77.8  Min: 51  Max: 101  SpO2  Avg: 98.7 %  Min: 90 %  Max: 322 %  Systolic (02RKY), HCW:237 , Min:119 , SEG:315   Diastolic (17OHY), WVP:71, Min:46,  Max:100    Admission Weight: 70.3 kg (154 lb 15.7 oz)    Current Weight: 70.3 kg (154 lb 15.7 oz)    Height: 5\' 7"  (170.2 cm)   Body mass index is 24.27 kg/m.@GFRCG @    Invasive Lines:     Patient Lines/Drains/Airways Status       Active Peripheral Venous Line / Central Venous Line / Arterial Line / Left Atrial Line / Epidural Line / Airway / Subcutaneous Line / Drain / PIV Line / Intraosseous Line       Name Placement date Placement time Site Days Last dressing change    PIV: 08/23/22 1105 20 gauge Forearm Anterior;Proximal;Right 08/23/22  1105  Forearm  less than 1     PIV: 08/23/22 1800 22 gauge Hand Posterior;Right 08/23/22  1800  Hand  less than 1     Central Line: 12/21/21 1047 Internal jugular Right Dialysis cuffed 12/21/21  1047  Internal jugular  245 08/23/22 1837 (12.38 hrs)               Medication     IV Drips    Scheduled Medications  ampicillin/sulbactam (Unasyn) 3 g in NS 100 mL IVPB, 3 g, Q24H  dexAMETHasone (Decadron) IV/IM Orderable, 10 mg, Q6H  heparin, 5,000 Units, Q8H  Hold Medication Daily Until Reordered, 1 Each, On Call  [Held by provider] insulin glargine, 0.2 Units/kg, Q24H  insulin LISPRO, 1-6 Units, QAC & QHS  lidocaine, 5 mL, Once  ondansetron (PF), 4 mg, Once    Mitzi Davenport, MD,RES, MD  [facility] Internal Medicine PGY-3  [phone]  08/24/2022, 6:39 AM     Electronically signed by Ezequiel Essex, MD at 08/24/2022 11:03 AM EST    Associated attestation - Ezequiel Essex, MD - 08/24/2022 11:03 AM EST  Formatting of this note is different from the original.  Attending Attestation    I have seen and examined [name] and reviewed all laboratory and clinical data.  I have reviewed  the fellow/resident/team's evaluation and management plan. The plan of care is based on my assessment.    My Assessment and Plan:   71 y.o. male with HTN, ESRD, DM presented with airway edema in setting of RSV infection. Admitted to ICU for airway watch.  Limited VF abduction with infraglottic edema - improved on  laryngoscopy this AM. Cont decadron and abx.  Needs repeat CT for evaluation of posterior cervical/thoracic lipomatous mass.   ESRD - plan for HD today    Anticipate transfer to SDU soon if OK with ENT.    Ezequiel Essex, MD  [facility] Pulmonary and Critical Care Medicine  Pulmonary Consult Pager [phone]  Critical Care Service Pager [phone]  Pulmonary Clinic [phone]  08/24/2022, 11:00 AM

## 2022-08-24 NOTE — Progress Notes (Signed)
Formatting of this note might be different from the original.  Hands off report given by [name] RN. Patient k level is 7.6 needs urgent HD in ER room. Patient is alert and oriented x 4.  Electronically signed by Nicanor Bake, RN at 08/24/2022  9:30 AM EST

## 2022-08-24 NOTE — ED Notes (Signed)
Formatting of this note is different from the original.     08/24/22 0400   Vital Signs   Temp 98.2 F (36.8 C)   Heart Rate 57   Pulse 57   BP 149/53   BP Mean 73 MM HG   SpO2 100 %   Device (Oxygen Therapy) nasal cannula   Flow (L/min) (Oxygen Therapy) 3     Resting on stretcher.   Electronically signed by Lelon Frohlich, RN at 08/24/2022  4:46 AM EST

## 2022-08-24 NOTE — ED Notes (Signed)
Formatting of this note might be different from the original.  Medicated pt per [facility]. Tolerated well. ED tech at bedside to straight stick pt for labs. Labs obtained and sent to ED stat lab. POC BG= 149. Held insulin. Provided pt with chicken broth and accepted. NAD noted at this time. Full cardiac monitor in place. VSS. Will continue to monitor.   Electronically signed by Lorri Frederick, RN at 08/24/2022  2:12 PM EST

## 2022-08-24 NOTE — Progress Notes (Signed)
Formatting of this note is different from the original.  Images from the original note were not included.      Impression:  ESKD   -[facility]    -MWF   -R TDC   -3 hours  Presents with dyspnea/wheezing x 3 days   -resp inspiratory stridor    -evaluated by ENT, started on Decadron   -?neck mass, infraflottic edema   HTN  DM    Plan:  Pt is being admitted for futher workup of insp stridor  Will continue with usual dilaysis MWF  Fluid removal as tolerated  Check phos  No need for epogen     Patient Active Problem List   Diagnosis    NSTEMI (non-ST elevated myocardial infarction) ([place])    Severe protein-calorie malnutrition (HCC)    ESRD (end stage renal disease) ([place])    Stridor    Biphasic stridor    Airway compromise    Neck mass    Difficult airway    Glottic edema    Posterior glottic stenosis     In addition to above plan:  Monitor volume status   Check daily weights  Check labs  Replete lytes PRN  Interval history, events, and chart reviewed    Subjective:    No chest pain  No SOB  No emesis  No chills  No abd pain  No pruritus  No change in appetite    Physical Exam:    Patient Vitals for the past 24 hrs:   Temp Heart Rate Pulse Resp BP BP Mean SpO2 Weight   08/24/22 0945 -- 76 76 25 175/72 98 MM HG 100 % --   08/24/22 0935 -- 75 75 17 184/67 94 MM HG 99 % --   08/24/22 0930 -- 76 76 19 -- -- 99 % --   08/24/22 0915 -- 77 77 20 192/75 (!) 105 MM HG 99 % --   08/24/22 0900 -- 81 81 21 198/75 (!) 108 MM HG 99 % --   08/24/22 0400 98.2 F (36.8 C) 57 57 -- 149/53 73 MM HG 100 % --   08/24/22 0200 -- 51 56 -- 148/86 (!) 102 MM HG 100 % --   08/24/22 0100 -- 71 70 -- 134/51 71 MM HG 100 % --   08/24/22 0000 -- 85 84 -- 148/61 82 MM HG 100 % --   08/23/22 2300 -- 81 88 14 178/57 88 MM HG 100 % --   08/23/22 2200 98 F (36.7 C) 90 90 16 176/80 (!) 104 MM HG 100 % --   08/23/22 2000 -- 78 82 14 119/100 (!) 104 MM HG 100 % --   08/23/22 1900 -- 101 101 18 145/60 82 MM HG 100 % --   08/23/22 1800 -- 88 89 16 163/84  98 MM HG 97 % --   08/23/22 1700 -- 83 83 16 136/61 79 MM HG 100 % --   08/23/22 1600 96.6 F (35.9 C) 72 79 16 144/46 72 MM HG 100 % --   08/23/22 1500 -- 73 75 -- 134/56 73 MM HG 100 % --   08/23/22 1400 -- 78 84 14 157/57 79 MM HG 90 % --   08/23/22 1354 -- 85 82 14 158/59 84 MM HG 99 % --   08/23/22 1300 -- 68 79 13 130/53 74 MM HG 96 % --   08/23/22 1200 -- 72 77 10 125/49 68 MM HG 96 % --  08/23/22 1100 -- 80 84 13 122/64 73 MM HG 99 % --   08/23/22 1010 98.2 F (36.8 C) 87 -- 16 159/70 100 MM HG 99 % 70.3 kg (154 lb 15.7 oz)     Wt Readings from Last 4 Encounters:   08/23/22 70.3 kg (154 lb 15.7 oz)   01/21/22 70.3 kg (155 lb)   12/21/21 70.4 kg (155 lb 3.3 oz)   07/25/21 63.5 kg (140 lb)     Intake/Output Summary (Last 24 hours) at 08/24/2022 0955  Last data filed at 08/23/2022 1814  Gross per 24 hour   Intake 100 ml   Output --   Net 100 ml     OP clear   CV: RRR  Lungs: Clear  Abd: Soft, Nontender, bowel sounds present  Extrem: No edema    Labs:    CBC w/Diff    Recent Labs     08/23/22  1354 08/23/22  1105   WBC 6.6 5.1   RBC 4.41 4.65   HEMOGLOBIN 10.0* 10.6*   HCT 32.7* 34.8*   MCV 74* 75*   MCH 23* 23*   MCHC 31 31   RDW 17.0* 17.2*   PLATELET 175 184   MPV 10.1 9.8    Recent Labs     08/23/22  1354 08/23/22  1105   SEGS  --  50   LYMPHOCYTES  --  35   MONOS  --  11   EOS  --  3   BASOS  --  1   RDW 17.0* 17.2*       Comprehensive Metabolic Profile    Recent Labs     08/24/22  0626 08/23/22  1105   NA 135 141   POTASSIUM 7.6* 5.3   CHLORIDE 98 97*   CO2 22 23   ANIONGAP 15.0 21.0*   BUN 72* 47*   CREAT 13.0* 11.3*   GLUCOSE 208* 127*    Recent Labs     08/24/22  0626 08/23/22  1809 08/23/22  1105   CALCIUM 9.2  --  9.4   ALBUMIN  --  4.3  --    TOTPR  --  7.6  --    ALKPHOS  --  108  --    SGOTAST  --  18  --    SGPTALT  --  17  --    BILIT  --  0.3  --          Lab Results   Component Value Date    CREAT 13.0 (H) 08/24/2022    CREAT 11.3 (H) 08/23/2022    CREAT 9.9 (H) 01/21/2022    CREAT 7.3 (H)  12/21/2021    CREAT 5.4 (H) 04/09/2021     Lab Results   Component Value Date    PHOSPHORUS 3.9 01/07/2021    PHOSPHORUS 2.9 01/06/2021    PHOSPHORUS 3.0 01/05/2021    PHOSPHORUS 3.4 01/04/2021    PHOSPHORUS 3.1 01/03/2021     Lab Results   Component Value Date    CALCIUMIONIZ 4.3 (L) 12/21/2021    CALCIUMIONIZ 5.1 04/09/2021    CALCIUMIONIZ 4.3 (L) 12/24/2020     Lab Results   Component Value Date    WBC 6.6 08/23/2022    WBC 5.1 08/23/2022    WBC 5.7 01/21/2022    WBC 8.1 01/06/2021    WBC 6.6 01/04/2021     Lab Results   Component Value Date    HEMOGLOBIN 10.0 (L)  08/23/2022    HEMOGLOBIN 10.6 (L) 08/23/2022    HEMOGLOBIN 11.4 (L) 01/21/2022    HEMOGLOBIN 13.3 12/21/2021    HEMOGLOBIN 15.3 04/09/2021     Lab Results   Component Value Date    PLATELET 175 08/23/2022    PLATELET 184 08/23/2022    PLATELET 125 (L) 01/21/2022    PLATELET 224 01/06/2021    PLATELET 198 01/04/2021     Lab Results   Component Value Date    ALBUMIN 4.3 08/23/2022    ALBUMIN 4.1 01/21/2022    ALBUMIN 2.6 (L) 01/07/2021    ALBUMIN 2.7 (L) 01/06/2021    ALBUMIN 2.6 (L) 01/05/2021     Lab Results   Component Value Date    MAGNESIUM 2.0 12/11/2020     Lab Results   Component Value Date    IRON 34 (L) 12/24/2020     Lab Results   Component Value Date    FERRITIN 185 12/24/2020     Lab Results   Component Value Date    PTHINT 31 12/24/2020     Urinalysis with Microscopy:   Lab Results   Component Value Date    UGLU 150* (A) 12/11/2020    UBILIRUBIN Negative 12/11/2020    UKET 5* (A) 12/11/2020    USPG 1.017 12/11/2020    UOCB Negative 12/11/2020    UPH 7.0 12/11/2020    UPSC >=500* (A) 12/11/2020    UUROBILISQ <2.0 12/11/2020    UNITR Negative 12/11/2020    URINELEUKOC Negative 12/11/2020    UWBC 3-5 (A) 12/11/2020    URBC 3-5 (A) 12/11/2020       No results found for this visit on 08/23/22.    Hospital Encounter on 08/23/22   Blood Culture (2 of 2)    Collection Time: 08/23/22  6:10 PM    Specimen: Blood   Result Value Status    Culture  Culture received - In progress Preliminary   Blood Culture (1 of 2)    Collection Time: 08/23/22  6:09 PM    Specimen: Blood   Result Value Status    Culture Culture received - In progress Preliminary     Meds reviewed in [facility]    Thank You      Electronically signed by Ronald Lobo, MD at 08/24/2022  9:55 AM EST

## 2022-08-24 NOTE — ED Notes (Signed)
Formatting of this note might be different from the original.  Pt daughter at bedside. Updated on POC.   Electronically signed by Lorri Frederick, RN at 08/24/2022  3:21 PM EST

## 2022-08-24 NOTE — ED Notes (Signed)
Formatting of this note might be different from the original.  Called HD nurse and made them aware as well that pts K is 7.6. has scheduled HD this am. Dialysis nurse plans to come down to ed asap for HD tx  Electronically signed by Juanell Fairly, RN at 08/24/2022  9:29 AM EST

## 2022-08-24 NOTE — Progress Notes (Signed)
Formatting of this note is different from the original.  Images from the original note were not included.     Transfer Summary    Admit Date: 08/23/2022  Date of Transfer:  08/24/2022, 5:25 PM    Patient ID:  Hunter Higgins  72 y.o.  Sep 22, 1950  [id number]    Attending Physician:  Ezequiel Essex, MD    Accepting [facility]    Chief Complaint:    Chief Complaint   Patient presents with    RESPIRATORY PROBLEM    SHORTNESS OF BREATH     Problem List:    Patient Active Problem List    Diagnosis Date Noted    Stridor 08/23/2022    Biphasic stridor 08/23/2022    Airway compromise 08/23/2022    Neck mass 08/23/2022    Difficult airway 08/23/2022    Glottic edema 08/23/2022    Posterior glottic stenosis 08/23/2022    ESRD (end stage renal disease) ([place]) 04/09/2021    Severe protein-calorie malnutrition ([place]) 12/31/2020    NSTEMI (non-ST elevated myocardial infarction) ([place]) 12/23/2020     Discharge Diagnosis:    Stridor  Glottic edema    Consultants:     ENT    Operative Procedures:   None    Imaging Studies:    Recent Results (from the past 336 hour(s))   1. EKG 12-LEAD   Result Value Ref Range    Heart Rate 68 bpm    RR Interval 888 ms    Atrial Rate 73 ms    P-R Interval 337 ms    P Duration 159 ms    P Horizontal Axis 80 deg    P Front Axis 50 deg    Q Onset 506 ms    QRSD Interval 101 ms    QT Interval 425 ms    QTcB 451 ms    QTcF 443 ms    QRS Horizontal Axis -35 deg    QRS Axis 25 deg    I-40 Front Axis 32 deg    t-40 Horizontal Axis -38 deg    T-40 Front Axis 21 deg    T Horizontal Axis 105 deg    T Wave Axis -87 deg    S-T Horizontal Axis 114 deg    S-T Front Axis 252 deg    Impression - ABNORMAL ECG -     Impression SA-Sinus arrhythmia-V-rate 63-72, variation>10%     Impression SARSV-Sinus pause-long R-R interval, normal QRSd     Impression 1AVB-Prolonged PR interval-PR >220, V-rate 50-90     Impression       REPB-Borderline repolarization abnormality-ST dep & abnormal T   2. CT CHEST W/ CONTRAST    Narrative     EXAM: CT CHEST W/ CONTRAST    CLINICAL INDICATION/HISTORY: End-stage renal disease, coronary artery disease, heart failure with shortness of breath.    COMPARISON: CT abdomen pelvis 01/21/2022    TECHNIQUE: CT chest with IV contrast.  All CT scans at this facility are performed using dose optimization technique as appropriate to the performed examination, to include automated exposure control, adjustment of the mA and/or kV according to patient's size (including appropriate matching for site-specific examinations), or use of an iterative reconstruction technique.    FINDINGS:   Base of neck: See separate dedicated neck report.    Lung/Airway:  Mild bibasilar atelectasis.    Pleura:  No pleural effusion.    Lymph nodes:  Significant perihilar and mediastinal calcified lymphadenopathy.    Cardiovascular: Right  IJ central venous catheter with tip in the right atrium.    Chest wall: 12 cm fat density rounded lesion over the left upper back, likely lipoma.    Upper abdominal structures: Hepatic steatosis. 2.2 cm right superior pole renal hypodense lesion, compatible with simple cyst. 1.2 cm hypodense lesion in the mid pancreatic tail adjacent to the duct (series 2, image 61).    Bones: Renal osteodystrophy.       Impression     See separate dedicated CT neck report.    1. Perihilar and mediastinal calcified lymphadenopathy, likely sequela of granulomatous disease.  2. Large lipoma at the left upper back.  3. Hepatic steatosis.  4. 1.2 cm cystic lesion in the pancreas, likely IPMN. Recommend nonemergent MRI with and without contrast on a nonemergent outpatient basis for better characterization.  5. Renal osteodystrophy.    Dictated ByRayburn Felt on 08/23/2022 9:59 PM    As attending radiologist, I have assessed the study images, and dictated or reviewed/edited the final report as needed.    Signed By: Peterson Lombard, MD on 08/23/2022 10:19 PM    3. CT ST NECK W/ CONTRAST    Narrative    EXAM: CT ST NECK W/  CONTRAST    CLINICAL INDICATION/HISTORY: Stridor, hypoxia, poor visualization of the vocal cords.    TECHNIQUE: Axial imaging from the skull base to the lung apices reconstructed into sagittal and coronal planes with IV contrast.    All CT scans at this facility are performed using dose optimization technique as appropriate to a performed exam, to include automated exposure control, adjustment of the MA and/or kV according to patient size (including appropriate matching for site-specific examinations) or use of  iterative reconstruction technique.    COMPARISON: None    FINDINGS:     Area of concern: Possible symmetric narrowing of the airway at the level of the true vocal cords. No obstruction or mass. No evidence of tonsillar abscess. There is no enlargement of the piriform sinuses.    Post-operative: None.    Visualized brain and orbits: Normal.    Sinuses/mastoid air cells: Partial ethmoid opacification.    Aerodigestive tract: There is usual obscuration of anatomic detail at the level of the teeth due to dental amalgam artifact. Mucosa is unremarkable.    Lymph nodes: No adenopathy.    Vasculature: Largest neck arteries are patent but are not closely evaluated.     Major salivary glands: Normal.    Thyroid: Subcentimeter hypodensity in the right inferior lobe, below threshold for follow-up.    Lung apices: Clear.    Osseous structures: Severe disc space narrowing at the mid to lower cervical spine with partial fusion of C6 and C7 vertebral bodies. No fracture or subluxation.     Impression    1.Symmetric slitlike narrowing of the airway at the level of the true vocal cords, may represent glottic stenosis. No supporting enlargement of the piriform sinuses. No nodular masslike regions. No significant inflammation.  2. Mild chronic ethmoid sinusitis .  3. Severe cervical degenerative disc disease..    Dictated ByRayburn Felt on 08/23/2022 9:52 PM    As attending radiologist, I have assessed the study images, and  dictated or reviewed/edited the final report as needed.    Signed By: Peterson Lombard, MD on 08/23/2022 10:32 PM    4. ED CT HEAD NO CONTRAST    Narrative    EXAM: ED CT HEAD NO CONTRAST    CLINICAL INDICATION/HISTORY: Brain metastases suspected ,  ESRD on dialysis. Shortness of breath, stridor, hypoxia.    COMPARISON: CT head 07/25/2021    TECHNIQUE: Axial imaging of the head from the skull base to the vertex without IV contrast and reconstructed into coronal and sagittal planes.    All CT scans at this facility are performed using dose optimization technique as appropriate to a performed exam, to include automated exposure control, adjustment of the MA and/or kV according to patient size (including appropriate matching for site-specific examinations) or use of  iterative reconstruction technique.    FINDINGS:    Brain: No acute intracranial hemorrhage. No evidence of acute cortical infarct. No mass effect, midline shift, or herniation. Mild decreased attenuation at the periventricular and deep white matter. Mild generalized volume loss.    Extra-Axial Spaces: No extra-axial fluid collections.    Ventricular System: Ventricles are symmetric. No hydrocephalus.    Sinuses/Mastoid Air Cells: Partial opacification of bilateral ethmoid air cells. Mastoid air cells are patent. Calvarium: No fracture or osseous lesions identified.     Impression    No acute intracranial abnormality.  Mild periventricular chronic microvascular ischemic disease.  Mild generalized volume loss.  Mild chronic ethmoid sinusitis.      Dictated ByRayburn Felt on 08/23/2022 9:39 PM    As attending radiologist, I have assessed the study images, and dictated or reviewed/edited the final report as needed.    Signed By: Peterson Lombard, MD on 08/23/2022 10:25 PM    5. EKG 12-LEAD   Result Value Ref Range    Heart Rate 87 bpm    RR Interval 660 ms    Atrial Rate 84 ms    P-R Interval 280 ms    P Duration 212 ms    P Horizontal Axis 202 deg    P Front Axis  252 deg    Q Onset 503 ms    QRSD Interval 93 ms    QT Interval 420 ms    QTcB 517 ms    QTcF 475 ms    QRS Horizontal Axis -25 deg    QRS Axis 45 deg    I-40 Front Axis 55 deg    t-40 Horizontal Axis -35 deg    T-40 Front Axis 35 deg    T Horizontal Axis 79 deg    T Wave Axis 8 deg    S-T Horizontal Axis 136 deg    S-T Front Axis 262 deg    Impression - ABNORMAL ECG -     Impression       SEAR-Sinus or ectopic atrial rhythm-P axis (-45,135)    Impression SARSV-Sinus pause-long R-R interval, normal QRSd     Impression 1AVB-Prolonged PR interval-PR >220, V-rate 50-90     Impression       LVHSR-Consider left ventricular hypertrophy-(S V1+R V5/V6) >3.23mV    Impression       STELVH-Anterior ST elevation, probably due to LVH-ST >0.20 mV in V1-V4 & LVH    Impression LQTB-Borderline prolonged QT interval-QTc >460mS     Impression -similar to previous-    6. CHEST PORTABLE    Narrative    EXAM: AP portable chest    CLINICAL INDICATION/HISTORY: CHEST PAIN    COMPARISON: 12/23/2020    FINDINGS:     Lines/tubes/devices: Dialysis catheter tip in the upper right atrium.    Lungs: Hazy opacification at the left lung base. No pneumothorax.    Mediastinum: Stable      Bones: No acute findings.  Impression    1.  Hazy opacification at the left lung base, probably atelectasis or pneumonia.    Signed By: Reyne Dumas, MD on 08/23/2022 11:05 AM      Hospital Course:    ADRIENE PADULA is a 72 y.o. male with PMH of  ESRD, CAD, Y7WG, diastolic heart failure presenting to the ED with shortness of breath & stridor. On ENT evaluation was found to have FFL notable for poor bilateral VF abduction and significant infraglottic edema. Admitted to the ICU for airway watch.   He received IV decadron 10 mg q6h and racemic epi prn.He was started on empiric unasyn. His symptoms have improved overall, he is stating a 100% on 2L of O2. He also received HD today.   Spoke to Ent resident Dr. Lyndel Pleasure, who stated that patient is stable for SDU and  needs a steroid taper.   Steroid taper ordered. Transferred to SDU with tele, accepting physician Dr. Rubie Maid    Most Recent BMP and CBC:    Recent Labs     08/24/22  0626   GLUCOSE 208*   BUN 72*   CALCIUM 9.2   CREAT 13.0*   NA 135   POTASSIUM 7.6*   CHLORIDE 98   CO2 22     Recent Labs     08/24/22  1412   WBC 11.4*   RBC 5.34   HEMOGLOBIN 12.0*   HCT 39.0   MCV 73*   MCH 23*   MCHC 31   PLATELET 207   RDW 16.6*     Condition at transfer: Afebrile and Stable    Disposition:        Current Medications     Current Medications:  acetaminophen (TylenoL) tablet 650 mg  ampicillin/sulbactam (Unasyn) 3 g in NS 100 mL IVPB  atropine injection 1 mg  calcium gluconate 2 gram/100 mL in 148ml NS IVPB 2 g  calcium gluconate in 48ml NS IVPB 1 g  D50W injection 25 mL  dexAMETHasone PF (Decadron) injection 10 mg  [START ON 08/26/2022] dexAMETHasone PF (Decadron) injection 10 mg  [START ON 08/27/2022] dexAMETHasone PF (Decadron) injection 10 mg  glucagon (Glucagen) injection 1 mg  glucose (Dex4 Glucose) chew tab 4 Tab  heparin injection 1,000 Units  heparin injection 5,000 Units  Hold Medication Daily Until Reordered  [Held by provider] insulin glargine (Lantus vial) injection 14 Units  insulin LISPRO (HumaLOG vial) injection 1-6 Units  lidocaine (Xylocaine) 4 % (40 mg/mL) solution 5 mL  magnesium oxide (Mag-Ox) tablet 400-800 mg  magnesium sulfate 1g/D5W 145ml ivpb 1 g  magnesium sulfate 2g/62ml (4%) in water IVPB 2 g  nitroglycerin (Nitrostat) tablet 0.4 mg  NS Flush injection 10 mL  ondansetron (PF) (Zofran) injection 4 mg  PHOS-NAK 280-160-250 mg powder 2 Packet  potassium chloride (Klor-Con) packet 20-60 mEq  potassium chloride ER (K-Dur;Klor-Con) tablet 20-60 mEq  potassium chloride ER (K-Dur;Klor-Con) tablet 20-60 mEq  potassium chloride in water (KCL) 10 mEq/100 mL IVPB 10 mEq  potassium chloride in water 20 mEq/100 mL infusion 20 mEq  potassium chloride in water 20 mEq/50 mL infusion 20 mEq  potassium phosphate (K Phos  Neutral/Phospha Neutral) 250 mg tablet 2 Tab  racEPINEPHrine (Asthmanefrin) 2.25 % nebulizer solution 0.5 mL  sodium chloride (normal saline) 0.9% infusion  sodium phosphate 12 mmol in D5W 100 mL IVPB  sodium phosphate 18 mmol in D5W 100 mL IVPB  sodium phosphate 21 mmol in D5W 100 mL IVPB  sodium phosphate 6  mmol in D5W 50 mL IVPB  sodium phosphate 9 mmol in D5W 100 mL IVPB    Mitzi Davenport, MD,RES, MD  [facility] Internal Medicine PGY-3  08/24/2022, 5:25 PM    Electronically signed by Mitzi Davenport, MD,RES at 08/24/2022  6:07 PM EST

## 2022-08-24 NOTE — Care Plan (Signed)
Formatting of this note might be different from the original.    Problem: Hemodialysis  Goal: Safe, Effective Therapy Delivery  Outcome: Progressing  Will tolerate the 3 hours of HD tx.  Goal: Effective Tissue Perfusion  Outcome: Progressing  Will attempt to remove 2 L UF net using special K bath.  Goal: Absence of Infection Signs and Symptoms  Outcome: Progressing   Will access Right IJ TDC using aseptic technique.  Electronically signed by Nicanor Bake, RN at 08/24/2022 10:10 AM EST

## 2022-08-24 NOTE — Progress Notes (Signed)
Formatting of this note is different from the original.  Speech Language Pathology Bedside Swallow Evaluation    General Info:  Room:  E029/E029  Visit Time In: 1520  Visit Time Out: 1543    Precautions:  General Precautions: Universal precautions, Fall precautions    Therapy Considerations:     Risk for Aspiration: Mild  Diet Recommendations: Regular solids and Thin liquids  Compensatory Strategy Recommendations: Slow rate of consumption, Complete oral care, Alternate solids/liquids, External pacing, Remain upright 20-30 mins after meals, and Small bites/sips  Recommended Form of Medications: Whole and With liquid  Therapy Upon Discharge: None    Pertinent Medical Hx/Presentation    History of Present Condition:   Chart Reviewed. History of Present Problem: 72 y.o. male with CAD, ESRD presented with SOB and stridor. Noted to have poor bil VF abduction and infraglottic edema on ENT scope. Improvement in symptoms, stridor, and edema this afternoon with antihistamines, racemic epi, and decadron.   Admit to ICU for airway watch  Large palpable posterior neck mass - present for years per patient; CT in 2022 suggested probable lipoma but could not exclude liposarcoma. Agree with repeat imaging.    Clinical Bedside Swallow Evaluation    Mentation: Alert and Oriented x4    Respiratory Status:   O2 delivery method: Nasal cannula    Oral Motor/Cranial Nerve Assessment:    Cranial Nerve V:  Mandibular/Motor: WFL  Sensation Forehead: Normal  Sensation Cheek: Normal    Cranial Nerve VII:   Facial Symmetry: WFL  Labial Deficit: WFL    Cranial Nerve IX/X:  Soft Palate Movement: WFL  Voicing: Hoarse  Volitional Cough: Strong    Cranial Nerve XII:  Lingual Motor: WFL    Positioning: Upright in bed    PO Trials:    Thin Liquids:  Presentation: Straw  Oral Phase: WFL  Pharyngeal Phase: Clinical evidence of impairment: No  [facility] Swallow Protocol/3 oz. Water test: Pass    Regular:  Presentation: Self fed  Oral Phase: WFL  Pharyngeal  Phase: Clinical evidence of impairment: No    Safety & Handoff:    Repositioned: Upright in bed  Safety: call light within reach, phone within reach, bedside table accessible, bed alarm on, all side rails up, and bed in low position    Rehab Potential:  good    Patient Education:  Patient educated on POC.     Assessment:  Clinical swallow evaluation completed. Current diet: Clear liquids. Oral motor exam grossly unremarkable. No clinical signs suggestive of aspiration across all dx PO trials. + strong productive cough. Recommend advancing diet as tolerated per MD discretion; Discussed with Dr. Rubie Maid. Standard aspiration precautions. Complete oral care.  POC d/w patient and RN. SLP to sign off at this time due to no further acute ST needs.     Plan:    ST Therapy Plan: Discontinue Speech Therapy (no acute needs)  ST Frequency: Discontinue therapy    Treatment Dx     ICD-9-CM ICD-10-CM   1. Stridor  786.1 R06.1   2. End stage renal disease (HCC)  585.6 N18.6   3. Coronary artery disease involving native heart without angina pectoris, unspecified vessel or lesion type  414.01 I25.10     Thank you,  Orlene Plum M.S., CCC-SLP  Phone Extension: [place] Office: [phone]    Electronically signed by Dolan Amen, SLP at 08/24/2022  6:33 PM EST

## 2022-08-25 ENCOUNTER — Encounter: Payer: MEDICARE | Primary: Internal Medicine

## 2022-08-25 NOTE — Progress Notes (Signed)
Formatting of this note might be different from the original.  Completed  2 hours of dialysis. Tolerated net fluid removal of  500 ml.  V/S stable; no significant event.  TDC de-accessed, flushed with saline and locked with  Heparin . Red curos cap applied. No sign of infection.  Handoff given to Laverda Page  Electronically signed by Lois Huxley, RN at 08/25/2022  9:29 PM EST

## 2022-08-25 NOTE — Progress Notes (Signed)
Formatting of this note might be different from the original.  Pre HD report received from rebecca Peart,RN.:  Patient alert oriented x 4,With O2 at 2 LPM/NC, with  Right Chest TDC,  on blood sugar monitoring, VSS for HD in RDU. No isolation anymore.  Electronically signed by Lois Huxley, RN at 08/25/2022 10:58 AM EST

## 2022-08-25 NOTE — Progress Notes (Signed)
Formatting of this note might be different from the original.  Nursing Admission [place] is a 71 y.o. admitted for Stridor [R06.1]  Biphasic stridor [R06.1].   Admission type: ER.    Information for this history was provided by patient    The following language barriers exist:none . Mr. Roger's primary language is [place]. The family speaks [place]. Services needed include: none . An interpreter is not required  to facilitate communication with Mr. Candela, his family and other support systems.     Mr. Hoselton was oriented to: BR/ER light , bed controls , TV/radio , patient education channel , telephone , bathroom , and call light .    Pt refusing skin check and rover pictures at this time  Electronically signed by Hassell Halim, RN at 08/25/2022  6:37 PM EST

## 2022-08-25 NOTE — Progress Notes (Signed)
Formatting of this note is different from the original.  Physical Therapy Evaluation    General Info:  Room #:  E029/E029  Visit Time In: 0951  Visit Time Out: 1044    Precautions:   Weight Bearing Status: Weight bearing as tolerated  General Precautions: Universal precautions, Fall precautions    History & Present Condition: Chart reviewed.    Home Environment/Prior Level of Function:   Lives With: Alone  Type of Home: Apartment  Home Layout: One level, Threshold entry  Home Equipment: Rolling walker, Cane  Level of Independence: Independent with ADLs, Independent with IADLs, Independent with mobility  Prior Assistive Device Used: None  Prior Function Comments: 3 falls since stopping using a RW, last time in Nov/Dec. Pt states he "choses" not to use his RW. Wife recently died, but does have some family in the area    Communication Needs: Communication Needs: None    Subjective:  agreeable    Pain:    No/denies pain    Current Functional Status:   Cognition:  Alert  Oriented x 3   ROM:     R LE: WFL  L LE: WFL    Strength:    R LE: WFL  L LE: WFL    Bed Mobility: Supine to Sit: Stand By Assist  Sit to Supine: Stand By Assist  Transfers: Device(s) used: none  Sit to/from stand: [employer], Verbal Cues, Tactile Cues; has major posterior LOB back onto bed on first trial, req'ing max A to safely return to sitting.   Gait: Level of Assistance: Min Assist, Verbal Cues, Tactile Cues  DME: No Device  Distance: 25 feet  Activity Tolerance: requires rest breaks, able to tolerate 1-2 minutes in standing, fatigues easily, unsteady  Patient Education: PT POC/role; benefits of mobility; safety    Treatment Provided:  Bed Mobility: Supine to Sit: Stand By Assist  Sit to Supine: Stand By Assist  Transfers: Device(s) used: none  Sit to/from stand: [employer], Verbal Cues, Tactile Cues; has major posterior LOB back onto bed on first trial, req'ing max A to safely return to sitting.   Gait: Level of Assistance: Min Assist,  Verbal Cues, Tactile Cues  DME: No Device  Distance: 25 feet  Functional Activities: Rest breaks, vitals monitoring, education on safety    Safety & Handoff:    Repositioned: Supine in bed  Safety: call light within reach, phone within reach, bedside table accessible, bed alarm on, and side rails x3 up    Rehab Potential: good    Assessment:  Pt seen for PT eval/tx and agreeable to therapy. Pt is currently limited by dec activity tolerance, generalized weakness, & deficits of gait/balance. Pt performs bed mobility with overall SBA. Multiple sit <> stand transfers req'd CGA. On first trial, pt had major posterior LOB, req'ing max A to safely to return to sitting. Pt amb short distance in room with min A overall d/t significant instability. Multiple minor LOB when negotiating turns. Pt fatigues quickly during ambulation. Pt to benefit from continued PT while in house to address the above deficits. Consider SNF upon d/c pending progress.     Therapy Considerations on Discharge  Therapy on Discharge: Subacute/skilled nursing facility    Home Equipment Recommended: None (Pt owns DME)    Plan:    PT Therapy Plan: Continue physical therapy  PT Frequency: 3-5 days/week  Plan of care will include assistive device recommendations, balance activities, functional activities, gait training, modalities as needed, neuromuscular reeducation, patient/family  education and training, ROM, stair training, transfer training, and therapeutic exercise.    Goals:    STG: PT goals to be met x1 week (08/25/22 - 08/31/22)   1) Pt will perform bed mobility with mod I in order to get OOB.   2) Pt will perform transfers with mod I in order to begin ambulation.   3) Pt will ambulate 50 ft with CGA and RW in order to negotiate household.     Treatment Dx     ICD-9-CM ICD-10-CM   1. Stridor  786.1 R06.1   2. End stage renal disease (HCC)  585.6 N18.6   3. Coronary artery disease involving native heart without angina pectoris, unspecified vessel or  lesion type  414.01 I25.10     Personal Protective Equipment was used including; face shield, mask-N95, hands-gloves, and body-gown. Patient was not masked.     Thank you,   Larey Brick, PT, DPT   Dept #: [phone]    Electronically signed by Larey Brick, PT at 08/25/2022 10:54 AM EST

## 2022-08-25 NOTE — Progress Notes (Signed)
Formatting of this note is different from the original.  [facility] - Head and Neck Surgery  Progress Note  Admit Date: 08/23/2022    HD: 3  Antibiotics: Unasyn    VTE Prophylaxis: SQH   Diet: CLD  Lines/Drains: PIV  Fluids/Gtt: None     SUBJECTIVE:  Hunter Higgins. Feels like breathing worsened a little as of a couple hrs ago. Missed his 0350 dose of decadron per [facility] for unclear reason, due for dose at 0700.    OBJECTIVE:  Temp (24hrs), Avg:98 F (36.7 C), Min:97 F (36.1 C), Max:98.6 F (37 C)    Temp: 98.4 F (36.9 C)  Pulse: 70  BP: 171/63  Resp: 16  SpO2: 99 % on 2L NC     Intake/Output Summary (Last 24 hours) at 08/25/2022 1027  Last data filed at 08/24/2022 1240  Gross per 24 hour   Intake 600 ml   Output 1800 ml   Net -1200 ml     Physical Exam:  General: NAD, resting comfortably in bed  HEENT: No trismus, edentulous except single mandibular tooth, tongue soft/mobile, OP symmetric. Nasal cannula in place.  Cardiovascular: RRR  Pulmonary: Chest rise symmetric, unlabored WOB, subtle biphasic stridor  Abdominal: Soft, non-distended, non-tender  Extremities: Warm    Labs:  CBC w/Diff   Recent Labs     08/25/22  0437 08/24/22  1412 08/23/22  1354 08/23/22  1105   WBC 11.4* 11.4* 6.6 5.1   HEMOGLOBIN 9.6* 12.0* 10.0* 10.6*   HCT 30.7* 39.0 32.7* 34.8*   PLATELET 182 207 175 184   MCV 73* 73* 74* 75*   SEGS 86*  --   --  50   LYMPHOCYTES 6*  --   --  35   MONOS 8  --   --  11   EOS 0  --   --  3   BASOS 0  --   --  1   RDW 16.6* 16.6* 17.0* 17.2*       Basic Metabolic Profile   Recent Labs     08/25/22  0437 08/24/22  0626 08/23/22  1105   NA 136 135 141   POTASSIUM 6.0* 7.6* 5.3   CHLORIDE 96* 98 97*   CO2 24 22 23    BUN 49* 72* 47*   CREAT 9.6* 13.0* 11.3*   GLUCOSE 191* 208* 127*   CALCIUM 8.9 9.2 9.4       Glucose   Recent Labs     08/25/22  0437 08/25/22  0341 08/24/22  1858 08/24/22  1410 08/24/22  0626 08/23/22  2207 08/23/22  1105   GLUCOSE 191*  --   --   --  208*  --  127*   GLUPOC  --  223* 267* 149*  --   212*  --        Imaging(24hr): none    ASSESSMENT:  Hunter Higgins is a 72 y.o. male w/ complex PMH including CAD, ESRD on HD with worsening SOB and inspiratory stridor with FFL notable for poor VF abduction and infraglottic edema.    Respiratory panel is RSV+. This is likely etiology of infraglottic edema present on admission, which appeared improved on repeat scope yesterday AM following decadron. 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    PLAN:    Recommend continuing steroids with taper over the next  few days to get pt through his recovery from RSV. Noted decadron scheduled for 10mg  q8h today.  PRN rac epi  Ok to advance diet as tolerated  If urgent distress requiring intubation, can be transorally intubated by Anesthesia with 6.0 ETT. If there is worsened symptoms, would prefer however to take patient Code 1 to OR for airway management.     Attending: Dr. Delos Haring, MD, PGY-4  Otolaryngology-Head and Neck Surgery  08/25/2022, 6:07 AM    Electronically signed by Sabino Dick, MD at 08/25/2022  5:31 PM EST

## 2022-08-25 NOTE — Progress Notes (Signed)
Formatting of this note might be different from the original.  For Hemodialysis today. Pt.arrived at HD unit per bed. With 02 at 2 LPM/NC. HD consent verified. Appropriate PPE worn by this RN (mask, gown, face shield, gloves). V/S taken. Machine checked. Orders verified. To run for x hour with net UF goal ml. To Use 1 K+ bath for Serum K+ = 6 . TDC at Right chest, accessed aseptically, HD started per order at 11:16 am.  Electronically signed by Lois Huxley, RN at 08/25/2022 11:39 AM EST

## 2022-08-25 NOTE — Progress Notes (Signed)
Formatting of this note is different from the original.  General Progress Note - Service Date: 08/25/2022   [facility] - Admission Date: 08/23/2022     Assessment & Plan       48 y. Old male with h/o ESRD, CAD, HTN, peripheral neuropathy, diastolic HF, PAD, DM, was admitted with SOB, stridor. Evaluated by ENT team, with concerns of significant glottic/infraglottic edema, with bilateral poor abduction of the vocal cords. Pt was diagnosed with RSV infection. Observed in ICU. Initiated on steroids. Symptoms improved. Being transferred to SDU.     A/P:    1. Bilateral poor abduction of vocal cords with glottic/infraglottic swelling - likely due to infection. Confirmed RSV infection. C/w steroids. Empiric ATB's. ENT team input noted. Per ENT team ok to advance diet as tolerated.     2. H/o DM - on Trulicity. Monitor glucose readings and insulin dose adjustment per glucose readings.     3. End stage renal disease - on HD M/W/F. S/p HD this am. Noted nephrology team input.     4. Hyperkalemia - prior to HD. Resolved with HD. Monitor.     5. CAD - noted prior cardiac cath report. Recommended medical TX. Per pt most of med's were discontinued at dialyze's unit, likely due to low BP with HD, but it seems pt did stop ASA along with other BP med's. He ran out prescription of Lipitor and did not renew. With known h/o CAD, consider to resume of ASA, statins. Per pt he has started OTC ASA couple days prior to admission.     6. Peripheral neuropathy - on gabapentin.     7. Lipoma - neck area. Per pt noted some increase in size, but has had long term and no significant changes.     PT eval/TX - noted recommendations of the SNF. Care coordination team input.   Discharge planning - with improvement of the symptoms transition home.     Subjective     Chief Complaint:  SOB    Interval Events: NO new events    Seen in dialysis unit. Afebrile. No CP. Intermittent SOB.     Review of Systems   Constitutional:  Positive  for fatigue.   Respiratory:  Negative for chest tightness.    Cardiovascular:  Negative for leg swelling.   Gastrointestinal:  Negative for abdominal pain, nausea and vomiting.   Genitourinary:  Negative for dysuria.   Neurological:  Negative for dizziness.   Psychiatric/Behavioral:  Negative for confusion.      Objective     Vital Signs:  BP: 134/66  Heart Rate: 71 (08/25/22 1000)  Pulse: 78 (08/25/22 1326)  Temp: 98.2 F (36.8 C)  Resp: 19  Height: 67" (170.2 cm)  Weight: 70.3 kg (154 lb 15.7 oz)  BMI (Calculated): 24.27  SpO2: 99 %  Flow (L/min) (Oxygen Therapy): 2  Temp (24hrs), Avg:98.3 F (36.8 C), Min:98.2 F (36.8 C), Max:98.4 F (36.9 C)      Intake/Output Summary (Last 24 hours) at 08/25/2022 1453  Last data filed at 08/25/2022 1319  Gross per 24 hour   Intake 500 ml   Output 1001 ml   Net -501 ml     Physical Exam  Constitutional:       Comments: No apparent distress   HENT:      Head: Normocephalic.   Eyes:      Conjunctiva/sclera: Conjunctivae normal.   Neck:      Comments: Posterior neck - subcutaneous lipoma.  Non tender.   Cardiovascular:      Rate and Rhythm: Regular rhythm.   Pulmonary:      Effort: Pulmonary effort is normal.      Comments: Few scattered wheezes, rhonchi    Abdominal:      General: There is no distension.      Palpations: Abdomen is soft.      Tenderness: There is no abdominal tenderness.   Musculoskeletal:      Cervical back: Neck supple.      Right lower leg: No edema.      Left lower leg: No edema.   Neurological:      General: No focal deficit present.      Mental Status: He is oriented to person, place, and time.   Psychiatric:         Mood and Affect: Mood normal.       I have reviewed the following pertinent result(s).  All labs in past 24hrs:   Results for orders placed or performed during the hospital encounter of 08/23/22 (from the past 24 hour(s))   GLUCOSE SCREEN   Result Value Ref Range    Glucose POC 267 (H) 70 - 99 mg/dL   GLUCOSE SCREEN   Result Value  Ref Range    Glucose POC 223 (H) 70 - 99 mg/dL   Comprehensive Metabolic Panel (CMP) now   Result Value Ref Range    Potassium 6.0 (H) 3.5 - 5.5 mmol/L    Sodium 136 133 - 145 mmol/L    Chloride 96 (L) 98 - 110 mmol/L    Glucose 191 (H) 70 - 99 mg/dL    Calcium 8.9 8.4 - 10.5 mg/dL    Albumin 4.0 3.5 - 5.0 g/dL    SGPT (ALT) 12 5 - 40 U/L    SGOT (AST) 18 10 - 37 U/L    Bilirubin Total 0.3 0.2 - 1.2 mg/dL    Alkaline Phosphatase 79 40 - 125 U/L    BUN 49 (H) 6 - 22 mg/dL    CO2 24 20 - 32 mmol/L    Creatinine 9.6 (H) 0.8 - 1.6 mg/dL    eGFR 5.4 (L) >60.0 mL/min/1.73 sq.m.    Globulin 3.0 2.0 - 4.0 g/dL    A/G Ratio 1.3 1.1 - 2.6 ratio    Total Protein 7.0 6.2 - 8.1 g/dL    Anion Gap 16.0 (H) 3.0 - 15.0 mmol/L   CBC WITH DIFFERENTIAL AUTO   Result Value Ref Range    WBC 11.4 (H) 4.0 - 11.0 K/uL    RBC 4.22 3.80 - 5.80 M/uL    HGB 9.6 (L) 12.6 - 17.1 g/dL    HCT 30.7 (L) 37.8 - 52.2 %    MCV 73 (L) 80 - 95 fL    MCH 23 (L) 26 - 34 pg    MCHC 31 31 - 36 g/dL    RDW 16.6 (H) 10.0 - 15.5 %    Platelet 182 140 - 440 K/uL    MPV 11.2 9.0 - 13.0 fL    Segmented Neutrophils (Auto) 86 (H) 40 - 75 %    Lymphocytes (Auto) 6 (L) 20 - 45 %    Monocytes (Auto) 8 3 - 12 %    Eosinophils (Auto) 0 0 - 6 %    Basophils (Auto) 0 0 - 2 %    Absolute Neutrophils (Auto) 9.8 (H) 1.8 - 7.7 K/uL    Absolute Lymphocytes (Auto) 0.7 (L) 1.0 -  4.8 K/uL    Absolute Monocytes (Auto) 0.9 0.1 - 1.0 K/uL    Absolute Eosinophils (Auto) 0.0 0.0 - 0.5 K/uL    Absolute Basophils (Auto) 0.0 0.0 - 0.2 K/uL   GLUCOSE SCREEN   Result Value Ref Range    Glucose POC 145 (H) 70 - 99 mg/dL   GLUCOSE SCREEN   Result Value Ref Range    Glucose POC 129 (H) 70 - 99 mg/dL     Medical Decision Making     Complexity  Category 1:  I have reviewed external notes.  I have reviewed the patient's labs.    I have discussed with an independent historian.    Category 3:  I have discussed the patient's care with a health care provider.The provider was caring nurse.    Total  time spent on this patient today: 35 minutes.    Checklist     Code Status: FULL       Pharmacologic VTE Prophylaxis (From admission, onward)      Ordered     Ordering Provider      Thu Aug 25, 2022  9:36 AM    08/25/22 0936  heparin injection 1,000 Units  AFTER EVERY DIALYSIS         Ronald Lobo, MD      Wed Aug 24, 2022  4:42 AM    08/24/22 0442  heparin injection 1,000 Units  AFTER EVERY DIALYSIS         Ronald Lobo, MD      Tue Aug 23, 2022  1:52 PM    08/23/22 1352  heparin injection 5,000 Units  EVERY 8 HOURS (0600,1400,2200)         Wynelle Link, MD,RES         Mechanical VTE Orders (From admission, onward)       Ordered     Start    08/23/22 1352  Sequential Compression Devices (SCDs)  UNTIL DISCONTINUED        Comments: To be worn a minimum of 18 hours/day while in bed or chair. Foot pumps may be applied if correct fit cannot be achieved with SCDs.    08/23/22 1347               Patient Lines/Drains/Airways Status       Active Peripheral Venous Line / Central Venous Line / Arterial Line / Left Atrial Line / Epidural Line / Airway / Subcutaneous Line / Drain / PIV Line / Intraosseous Line       Name Placement date Placement time Site Days Last dressing change    PIV: 08/23/22 1105 20 gauge Forearm Anterior;Proximal;Right 08/23/22  1105  Forearm  2     PIV: 08/23/22 1800 22 gauge Hand Posterior;Right 08/23/22  1800  Hand  1     PIV: 08/24/22 1413 20 gauge Forearm Left;Posterior;Proximal 08/24/22  1413  Forearm  1     Central Line: 12/21/21 1047 Internal jugular Right Dialysis cuffed 12/21/21  1047  Internal jugular  247 08/23/22 1837 (44.27 hrs)               IP ANTIINFECTIVES (From admission, onward)       Start     Ordered Stop    08/23/22 1900  ampicillin/sulbactam (Unasyn) 3 g in NS 100 mL IVPB  3 g,   Intravenous,   EVERY 24 HOURS         08/23/22 1704 08/28/22 1859  Leane Platt, MD  08/25/2022, 2:53 PM    Electronically signed by Leane Platt, MD at 08/25/2022  2:56 PM EST

## 2022-08-25 NOTE — ED Notes (Signed)
Formatting of this note might be different from the original.  Assumed care of the patient at this time. Patient presents with shortness of breath. He has a known mass in his neck. Sent to be evaluated by ENT. Upon assessment of the patient he is alert and oriented, answering all questions appropriately and breathing with no issues.  Electronically signed by Erline Levine, RN at 08/25/2022 10:12 AM EST

## 2022-08-25 NOTE — ED Notes (Signed)
Formatting of this note might be different from the original.  Patient transferred to Dialysis by ED tech  Electronically signed by Erline Levine, RN at 08/25/2022 11:05 AM EST

## 2022-08-25 NOTE — ED Notes (Signed)
Formatting of this note might be different from the original.  Bedside report given to  Rebecca,RN. End of care.   Electronically signed by Imagene Riches, RN at 08/25/2022  8:18 AM EST

## 2022-08-25 NOTE — Progress Notes (Signed)
Formatting of this note is different from the original.  Images from the original note were not included.      Impression:  ESKD              -[facility] [place]               -MWF              -R TDC              -3 hours  Presents with dyspnea/wheezing x 3 days              -resp inspiratory stridor               -evaluated by ENT, started on Decadron              -?neck mass, infraflottic edema   RSV infection   HTN  DM  Hyperkalemia     Afebrile  BP 140s-180s\  99% RA  S/p HD with 1.8L removed yesterday           Plan:  K still high, 2 hours of HD today   Reg HD tomorrow   Check phos  No need for epogen     Patient Active Problem List   Diagnosis    NSTEMI (non-ST elevated myocardial infarction) (HCC)    Severe protein-calorie malnutrition (HCC)    ESRD (end stage renal disease) ([place])    Stridor    Biphasic stridor    Airway compromise    Neck mass    Difficult airway    Glottic edema    Posterior glottic stenosis     In addition to above plan:  Monitor volume status   Check daily weights  Check labs  Replete lytes PRN  Interval history, events, and chart reviewed    Subjective:    No chest pain  No SOB  No emesis  No chills  No abd pain  No pruritus  No change in appetite    Physical Exam:    Patient Vitals for the past 24 hrs:   Temp Heart Rate Pulse Resp BP BP Mean SpO2   08/25/22 0700 -- 67 67 13 176/65 94 MM HG 99 %   08/25/22 0300 98.4 F (36.9 C) 71 70 -- 171/63 92 MM HG 99 %   08/25/22 0200 -- 74 72 -- 166/67 90 MM HG 99 %   08/25/22 0100 -- 68 67 -- 173/67 93 MM HG 100 %   08/25/22 0000 -- 69 69 -- 164/53 78 MM HG 100 %   08/24/22 1830 -- 65 65 16 159/88 (!) 105 MM HG 100 %   08/24/22 1500 -- 63 65 16 143/55 78 MM HG 100 %   08/24/22 1401 98.1 F (36.7 C) 64 72 11 141/97 (!) 108 MM HG 96 %   08/24/22 1300 98.6 F (37 C) 69 67 15 131/63 79 MM HG 100 %   08/24/22 1240 97 F (36.1 C) 69 70 11 131/62 76 MM HG 99 %   08/24/22 1230 -- 77 77 14 126/55 71 MM HG 99 %   08/24/22 1215 -- 71 71 17 100/60 73 MM HG  99 %   08/24/22 1200 -- 71 71 16 117/61 80 MM HG 99 %   08/24/22 1145 -- 67 70 9 118/69 85 MM HG 99 %   08/24/22 1130 -- 71 71 16 (!) 88/48 (!) 57 MM HG 98 %   08/24/22 1115 --  71 71 16 96/56 69 MM HG 99 %   08/24/22 1100 -- 71 71 16 95/54 68 MM HG 99 %   08/24/22 1045 -- 65 65 14 124/62 83 MM HG 99 %   08/24/22 1030 -- 59 64 10 149/64 84 MM HG 99 %   08/24/22 1015 -- 72 72 8 161/62 85 MM HG 100 %   08/24/22 1000 -- 52 53 12 157/51 71 MM HG 99 %   08/24/22 0945 -- 76 76 25 175/72 98 MM HG 100 %     Wt Readings from Last 4 Encounters:   08/23/22 70.3 kg (154 lb 15.7 oz)   01/21/22 70.3 kg (155 lb)   12/21/21 70.4 kg (155 lb 3.3 oz)   07/25/21 63.5 kg (140 lb)     Intake/Output Summary (Last 24 hours) at 08/25/2022 0935  Last data filed at 08/24/2022 1240  Gross per 24 hour   Intake 350 ml   Output 1800 ml   Net -1450 ml     OP clear   CV: RRR  Lungs: Clear  Abd: Soft, Nontender, bowel sounds present  Extrem: No edema    Labs:    CBC w/Diff    Recent Labs     08/25/22  0437 08/24/22  1412 08/23/22  1354   WBC 11.4* 11.4* 6.6   RBC 4.22 5.34 4.41   HEMOGLOBIN 9.6* 12.0* 10.0*   HCT 30.7* 39.0 32.7*   MCV 73* 73* 74*   MCH 23* 23* 23*   MCHC 31 31 31    RDW 16.6* 16.6* 17.0*   PLATELET 182 207 175   MPV 11.2 10.0 10.1    Recent Labs     08/25/22  0437 08/23/22  1354 08/23/22  1105   SEGS 86*  --  50   LYMPHOCYTES 6*  --  35   MONOS 8  --  11   EOS 0  --  3   BASOS 0  --  1   RDW 16.6*   < > 17.2*    < > = values in this interval not displayed.       Comprehensive Metabolic Profile    Recent Labs     08/25/22  0437 08/24/22  0626 08/23/22  1105   NA 136 135 141   POTASSIUM 6.0* 7.6* 5.3   CHLORIDE 96* 98 97*   CO2 24 22 23    ANIONGAP 16.0* 15.0 21.0*   BUN 49* 72* 47*   CREAT 9.6* 13.0* 11.3*   GLUCOSE 191* 208* 127*    Recent Labs     08/25/22  0437 08/24/22  0626 08/23/22  1809 08/23/22  1105   CALCIUM 8.9 9.2  --  9.4   ALBUMIN 4.0  --  4.3  --    TOTPR 7.0  --  7.6  --    ALKPHOS 79  --  108  --    SGOTAST 18  --  18   --    SGPTALT 12  --  17  --    BILIT 0.3  --  0.3  --          Lab Results   Component Value Date    CREAT 9.6 (H) 08/25/2022    CREAT 13.0 (H) 08/24/2022    CREAT 11.3 (H) 08/23/2022    CREAT 9.9 (H) 01/21/2022    CREAT 7.3 (H) 12/21/2021     Lab Results   Component Value Date  PHOSPHORUS 3.9 01/07/2021    PHOSPHORUS 2.9 01/06/2021    PHOSPHORUS 3.0 01/05/2021    PHOSPHORUS 3.4 01/04/2021    PHOSPHORUS 3.1 01/03/2021     Lab Results   Component Value Date    CALCIUMIONIZ 4.3 (L) 12/21/2021    CALCIUMIONIZ 5.1 04/09/2021    CALCIUMIONIZ 4.3 (L) 12/24/2020     Lab Results   Component Value Date    WBC 11.4 (H) 08/25/2022    WBC 11.4 (H) 08/24/2022    WBC 6.6 08/23/2022    WBC 5.1 08/23/2022    WBC 5.7 01/21/2022     Lab Results   Component Value Date    HEMOGLOBIN 9.6 (L) 08/25/2022    HEMOGLOBIN 12.0 (L) 08/24/2022    HEMOGLOBIN 10.0 (L) 08/23/2022    HEMOGLOBIN 10.6 (L) 08/23/2022    HEMOGLOBIN 11.4 (L) 01/21/2022     Lab Results   Component Value Date    PLATELET 182 08/25/2022    PLATELET 207 08/24/2022    PLATELET 175 08/23/2022    PLATELET 184 08/23/2022    PLATELET 125 (L) 01/21/2022     Lab Results   Component Value Date    ALBUMIN 4.0 08/25/2022    ALBUMIN 4.3 08/23/2022    ALBUMIN 4.1 01/21/2022    ALBUMIN 2.6 (L) 01/07/2021    ALBUMIN 2.7 (L) 01/06/2021     Lab Results   Component Value Date    MAGNESIUM 2.0 12/11/2020     Lab Results   Component Value Date    IRON 34 (L) 12/24/2020     Lab Results   Component Value Date    FERRITIN 185 12/24/2020     Lab Results   Component Value Date    PTHINT 31 12/24/2020     Urinalysis with Microscopy:   Lab Results   Component Value Date    UGLU 150* (A) 12/11/2020    UBILIRUBIN Negative 12/11/2020    UKET 5* (A) 12/11/2020    USPG 1.017 12/11/2020    UOCB Negative 12/11/2020    UPH 7.0 12/11/2020    UPSC >=500* (A) 12/11/2020    UUROBILISQ <2.0 12/11/2020    UNITR Negative 12/11/2020    URINELEUKOC Negative 12/11/2020    UWBC 3-5 (A) 12/11/2020    URBC 3-5 (A)  12/11/2020       No results found for this visit on 08/23/22.    Hospital Encounter on 08/23/22   Blood Culture (2 of 2)    Collection Time: 08/23/22  6:10 PM    Specimen: Blood   Result Value Status    Culture No Growth at 24 hours Preliminary   Blood Culture (1 of 2)    Collection Time: 08/23/22  6:09 PM    Specimen: Blood   Result Value Status    Culture No Growth at 24 hours Preliminary     Meds reviewed in [facility]    Thank You      Electronically signed by Ronald Lobo, MD at 08/25/2022  9:36 AM EST

## 2022-08-25 NOTE — Care Plan (Signed)
Formatting of this note might be different from the original.    Problem: Device-Related Complication Risk (Hemodialysis)  Goal: Safe, Effective Therapy Delivery  Outcome: Ongoing, Progressing   Machine safety checks done and passed. To use  1 K bath for serum  K= 6. To monitor machine every 15 minutes.  Problem: Hemodynamic Instability (Hemodialysis)  Goal: Effective Tissue Perfusion  Outcome: Ongoing, Progressing   To run for 2 H with net UF goal= 500 ml. To monitor vital signs every 15 minutes.  Problem: Infection (Hemodialysis)  Goal: Absence of Infection Signs and Symptoms  Outcome: Ongoing, Progressing  To wear PPE (mask, gown, face shied, gloves) while accessing dialysis access.  To  access Right chest TDC aseptically.  Electronically signed by Lois Huxley, RN at 08/25/2022 11:40 AM EST

## 2022-08-26 LAB — HEMOGLOBIN A1C
Estimated Avg Glucose, External: 173 mg/dL — ABNORMAL HIGH (ref 91–123)
Hemoglobin A1C, External: 7.7 % — ABNORMAL HIGH (ref 4.8–5.6)

## 2022-08-26 NOTE — Progress Notes (Signed)
Formatting of this note might be different from the original.  EKG and labs done as ordered  Electronically signed by Vista Mink, RN at 08/26/2022  3:44 AM EST

## 2022-08-26 NOTE — Progress Notes (Signed)
Formatting of this note might be different from the original.  For Hemodialysis today. Pt.arrived at HD unit per bed. With telemetry box #11, Telemetry [employer] informed. HD consent verified. Appropriate PPE worn by this RN (mask, gown, face shield, gloves). V/S taken. Machine checked. Orders verified. To run for 3 hours with net UF goal 2000 to 3000 ml. To Use 2 K+ bath for Serum K+ =5.3 . TDC at Right chest, accessed aseptically, HD started per order at 17:28 pm.  Electronically signed by Lois Huxley, RN at 08/26/2022  5:42 PM EST

## 2022-08-26 NOTE — Progress Notes (Signed)
Formatting of this note is different from the original.  [facility]  Progress Note  Admit Date: 08/23/2022    HD: 4  Antibiotics: Unasyn    VTE Prophylaxis: SQH   Diet: Diabetic   Lines/Drains: -  Fluids/Gtt: -     SUBJECTIVE:  Interval: no events overnight.     Doing okay. Weaned to RA overnight. Feels good right now. Says breathing is intermittently tight. Was sleeping soundly when I entered the room.     OBJECTIVE:  Temp (24hrs), Avg:98.3 F (36.8 C), Min:97.2 F (36.2 C), Max:99 F (37.2 C)    Temp: 98.2 F (36.8 C)  Pulse: 72  BP: 151/70  Resp: 17  SpO2: 96 % on RA    Physical Exam:  General: NAD, resting comfortably in bed  HEENT: No trismus, edentulous except single mandibular tooth, tongue soft/mobile, OP symmetric. No stridor at all this AM  Cardiovascular: RRR, 2+ radial pulses  Pulmonary: Chest rise symmetric, normal WOB  Abdominal: Soft, non-distended, non-tender  Extremities: Warm and well perfused.     Labs:  CBC w/Diff   Recent Labs     08/26/22  0340 08/25/22  0437 08/24/22  1412 08/23/22  1354 08/23/22  1105   WBC 8.3 11.4* 11.4*   < > 5.1   HEMOGLOBIN 10.0* 9.6* 12.0*   < > 10.6*   HCT 32.1* 30.7* 39.0   < > 34.8*   PLATELET 182 182 207   < > 184   MCV 73* 73* 73*   < > 75*   SEGS 87* 86*  --   --  50   LYMPHOCYTES 6* 6*  --   --  35   MONOS 7 8  --   --  11   EOS 0 0  --   --  3   BASOS 0 0  --   --  1   RDW 17.0* 16.6* 16.6*   < > 17.2*    < > = values in this interval not displayed.       Basic Metabolic Profile   Recent Labs     08/26/22  0340 08/25/22  0437 08/24/22  0626   NA 136 136 135   POTASSIUM 5.3 6.0* 7.6*   CHLORIDE 96* 96* 98   CO2 25 24 22    BUN 49* 49* 72*   CREAT 7.7* 9.6* 13.0*   GLUCOSE 218* 191* 208*   CALCIUM 8.5 8.9 9.2   MAGNESIUM 2.1  --   --    PHOSPHORUS 5.7*  --   --        Glucose   Recent Labs     08/26/22  0340 08/25/22  2115 08/25/22  1745 08/25/22  1242 08/25/22  0807 08/25/22  0437 08/25/22  0341 08/24/22  1410 08/24/22  0626  08/23/22  2207 08/23/22  1105   GLUCOSE 218*  --   --   --   --  191*  --   --  208*  --  127*   GLUPOC  --  270* 178* 129* 145*  --  223*   < >  --    < >  --     < > = values in this interval not displayed.       Imaging(24hr):  n/a    ASSESSMENT:  Hunter Higgins is a 72 y.o. male w/ complex PMH including CAD and ESRD on HD presenting with worsening SOB and  inspiratory stridor with FFL notable for poor VF abduction and infraglottic edema.    Respiratory panel is RSV+. This is likely etiology of infraglottic edema present on admission. 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    PLAN:    Unasyn --> Augmentin, total course 5 days is fine with [place]  Continue decadron tape as ordered  Prn rac epi  Okay to continue work towards d/c as steroid taper allows  Airway plan:  Seems less likely at this point, but if urgent distress requiring intubation, can be transorally intubated by Anesthesia with 6.0 ETT. If there is worsened symptoms, would prefer however to take patient Code 1 to OR for airway management  We will arrange outpatient f/u at discharge  ENT attending: Dr Lahoma Rocker, MD, PGY-5  [facility]  [phone] (M-F 7-5; after hours please page on-call resident)  08/26/2022, 7:20 AM    Electronically signed by Sabino Dick, MD at 08/30/2022 11:07 AM EST

## 2022-08-26 NOTE — Progress Notes (Signed)
Formatting of this note is different from the original.  General Progress Note - Service Date: 08/26/2022   [facility] - Admission Date: 08/23/2022     Assessment & Plan       55 y. Old male with h/o ESRD, CAD, HTN, peripheral neuropathy, diastolic HF, PAD, DM, was admitted with SOB, stridor. Evaluated by ENT team, with concerns of significant glottic/infraglottic edema, with bilateral poor abduction of the vocal cords. Pt was diagnosed with RSV infection. Observed in ICU. Initiated on steroids. Symptoms improved. Being transferred to SDU.     A/P:    1. Bilateral poor abduction of vocal cords with glottic/infraglottic swelling - likely due to infection. Confirmed RSV infection. C/w steroids. Empiric ATB's. ENT team input noted. Per ENT team ok to advance diet as tolerated. Transition home with PO ATB's and steroids.     2. H/o DM - on Trulicity. Monitor glucose readings and insulin dose adjustment per glucose readings.     3. End stage renal disease - on HD M/W/F. Noted nephrology team input.     4. Hyperkalemia - prior to HD. Resolved with HD. Monitor.     5. CAD - noted prior cardiac cath report. Recommended medical TX. Per pt most of med's were discontinued at dialyze's unit, likely due to low BP with HD, but it seems pt did stop ASA along with other BP med's. He ran out prescription of Lipitor and did not renew. With known h/o CAD, consider to resume of ASA, statins. Per pt he has started OTC ASA couple days prior to admission.     6. Peripheral neuropathy - on gabapentin.     7. Lipoma - neck area. Per pt noted some increase in size, but has had long term and no significant changes.     8. Second degree AV block - per initial considered type II. EP team was consulted, noted input. Considered type I and to continue to observe.     9. Constipation - bowel regimen.     PT eval/TX - noted recommendations of the SNF. Care coordination team consulted, noted input.  Discharge planning - with  improvement of the symptoms transition home.     Subjective     Chief Complaint:  SOB    Interval Events: No new events    Seen in dialysis unit. Afebrile. No CP. Intermittent SOB. Improved symptoms.     Review of Systems   Constitutional:  Positive for fatigue.   Respiratory:  Negative for chest tightness.    Cardiovascular:  Negative for leg swelling.   Gastrointestinal:  Negative for abdominal pain, nausea and vomiting.   Genitourinary:  Negative for dysuria.   Neurological:  Negative for dizziness.   Psychiatric/Behavioral:  Negative for confusion.      Objective     Vital Signs:  BP: 120/59  Heart Rate: 68 (08/26/22 0148)  Pulse: 66 (08/26/22 0828)  Temp: 98.1 F (36.7 C)  Resp: 18  Height: 67" (170.2 cm)  Weight: 76.1 kg (167 lb 12.3 oz)  BMI (Calculated): 24.27  SpO2: 99 %  Flow (L/min) (Oxygen Therapy): 2  Temp (24hrs), Avg:98.3 F (36.8 C), Min:97.2 F (36.2 C), Max:99 F (37.2 C)      Intake/Output Summary (Last 24 hours) at 08/26/2022 0921  Last data filed at 08/25/2022 1319  Gross per 24 hour   Intake 500 ml   Output 1001 ml   Net -501 ml     Physical Exam  Constitutional:  Comments: No apparent distress   HENT:      Head: Normocephalic.   Eyes:      Conjunctiva/sclera: Conjunctivae normal.   Neck:      Comments: Posterior neck - subcutaneous lipoma. Non tender.   Cardiovascular:      Rate and Rhythm: Regular rhythm.   Pulmonary:      Effort: Pulmonary effort is normal.      Comments: Few scattered wheezes, rhonchi    Abdominal:      General: There is no distension.      Palpations: Abdomen is soft.      Tenderness: There is no abdominal tenderness.   Musculoskeletal:      Cervical back: Neck supple.      Right lower leg: No edema.      Left lower leg: No edema.   Neurological:      General: No focal deficit present.      Mental Status: He is oriented to person, place, and time.   Psychiatric:         Mood and Affect: Mood normal.         I have reviewed the following pertinent  result(s).  All labs in past 24hrs:   Results for orders placed or performed during the hospital encounter of 08/23/22 (from the past 24 hour(s))   GLUCOSE SCREEN   Result Value Ref Range    Glucose POC 129 (H) 70 - 99 mg/dL   GLUCOSE SCREEN   Result Value Ref Range    Glucose POC 178 (H) 70 - 99 mg/dL   GLUCOSE SCREEN   Result Value Ref Range    Glucose POC 270 (H) 70 - 99 mg/dL   BASIC METABOLIC PANEL   Result Value Ref Range    Potassium 5.3 3.5 - 5.5 mmol/L    Sodium 136 133 - 145 mmol/L    Chloride 96 (L) 98 - 110 mmol/L    Glucose 218 (H) 70 - 99 mg/dL    Calcium 8.5 8.4 - 10.5 mg/dL    BUN 49 (H) 6 - 22 mg/dL    Creatinine 7.7 (H) 0.8 - 1.6 mg/dL    CO2 25 20 - 32 mmol/L    eGFR 7.0 (L) >60.0 mL/min/1.73 sq.m.    Anion Gap 15.0 3.0 - 15.0 mmol/L   PHOSPHORUS SERUM   Result Value Ref Range    Phosphorus 5.7 (H) 2.5 - 4.5 mg/dL   CBC WITH DIFFERENTIAL AUTO   Result Value Ref Range    WBC 8.3 4.0 - 11.0 K/uL    RBC 4.42 3.80 - 5.80 M/uL    HGB 10.0 (L) 12.6 - 17.1 g/dL    HCT 32.1 (L) 37.8 - 52.2 %    MCV 73 (L) 80 - 95 fL    MCH 23 (L) 26 - 34 pg    MCHC 31 31 - 36 g/dL    RDW 17.0 (H) 10.0 - 15.5 %    Platelet 182 140 - 440 K/uL    MPV 11.5 9.0 - 13.0 fL    Segmented Neutrophils (Auto) 87 (H) 40 - 75 %    Lymphocytes (Auto) 6 (L) 20 - 45 %    Monocytes (Auto) 7 3 - 12 %    Eosinophils (Auto) 0 0 - 6 %    Basophils (Auto) 0 0 - 2 %    Absolute Neutrophils (Auto) 7.2 1.8 - 7.7 K/uL    Absolute Lymphocytes (Auto) 0.5 (L) 1.0 - 4.8 K/uL  Absolute Monocytes (Auto) 0.6 0.1 - 1.0 K/uL    Absolute Eosinophils (Auto) 0.0 0.0 - 0.5 K/uL    Absolute Basophils (Auto) 0.0 0.0 - 0.2 K/uL   MAGNESIUM SERUM   Result Value Ref Range    Magnesium 2.1 1.6 - 2.5 mg/dL   GLUCOSE SCREEN   Result Value Ref Range    Glucose POC 237 (H) 70 - 99 mg/dL     Medical Decision Making     Complexity  Category 1:  I have reviewed external notes.  I have reviewed the patient's labs.    I have discussed with an independent  historian.    Category 3:  I have discussed the patient's care with a health care provider.The provider was care coordination team, caring nurse.    Total time spent on this patient today: 35 minutes.    Checklist     Code Status: FULL       Pharmacologic VTE Prophylaxis (From admission, onward)      Ordered     Ordering Provider      Thu Aug 25, 2022  9:36 AM    08/25/22 0936  heparin injection 1,000 Units  AFTER EVERY DIALYSIS         Ronald Lobo, MD      Wed Aug 24, 2022  4:42 AM    08/24/22 0442  heparin injection 1,000 Units  AFTER EVERY DIALYSIS         Ronald Lobo, MD      Tue Aug 23, 2022  1:52 PM    08/23/22 1352  heparin injection 5,000 Units  EVERY 8 HOURS (0600,1400,2200)         Wynelle Link, MD,RES         Mechanical VTE Orders (From admission, onward)       Ordered     Start    08/23/22 1352  Sequential Compression Devices (SCDs)  UNTIL DISCONTINUED        Comments: To be worn a minimum of 18 hours/day while in bed or chair. Foot pumps may be applied if correct fit cannot be achieved with SCDs.    08/23/22 1347               Patient Lines/Drains/Airways Status       Active Peripheral Venous Line / Central Venous Line / Arterial Line / Left Atrial Line / Epidural Line / Airway / Subcutaneous Line / Drain / PIV Line / Intraosseous Line       Name Placement date Placement time Site Days Last dressing change    PIV: 08/23/22 1105 20 gauge Forearm Anterior;Proximal;Right 08/23/22  1105  Forearm  2     PIV: 08/23/22 1800 22 gauge Hand Posterior;Right 08/23/22  1800  Hand  2     PIV: 08/24/22 1413 20 gauge Forearm Left;Posterior;Proximal 08/24/22  1413  Forearm  1     Central Line: 12/21/21 1047 Internal jugular Right Dialysis cuffed 12/21/21  1047  Internal jugular  247 08/23/22 1837 (62.75 hrs)               IP ANTIINFECTIVES (From admission, onward)       Start     Ordered Stop    08/23/22 1900  ampicillin/sulbactam (Unasyn) 3 g in NS 100 mL IVPB  3 g,   Intravenous,   EVERY 24 HOURS         08/23/22  1704 08/28/22 1859  Leane Platt, MD  08/26/2022, 9:21 AM    Electronically signed by Leane Platt, MD at 08/26/2022 11:13 PM EST

## 2022-08-26 NOTE — Telephone Encounter (Signed)
Spoke with patient in person on 08/17/2022. Patient came into office, he stated he has the Trulicity and has been taking it.

## 2022-08-26 NOTE — Progress Notes (Signed)
Formatting of this note might be different from the original.  Completed  3 hours of dialysis.  Only Tolerated net fluid removal of 1263 ml due to episode of low BP, NSS was given,UF goal adjusted.  V/S stable post HD..  TDC de-accessed, flushed with saline and locked with  Heparin. Red curos cap applied. No sign of infection.  Handoff given to  Vista Mink, RN  Electronically signed by Lois Huxley, RN at 08/26/2022  8:55 PM EST

## 2022-08-26 NOTE — Care Plan (Signed)
Formatting of this note might be different from the original.    Problem: Device-Related Complication Risk (Hemodialysis)  Goal: Safe, Effective Therapy Delivery  Outcome: Ongoing, Progressing   Machine safety checks done and passed. To use  2 K bath for serum  K= 5.3. To monitor machine every 15 minutes.  Problem: Hemodynamic Instability (Hemodialysis)  Goal: Effective Tissue Perfusion  Outcome: Ongoing, Progressing   To run for 3 H with net UF goal= 2 to 3 L. To monitor vital signs every 15 minutes.  Problem: Infection (Hemodialysis)  Goal: Absence of Infection Signs and Symptoms  Outcome: Ongoing, Progressing  To wear PPE (mask, gown, face shied, gloves) while accessing dialysis access.  To  access Right  Chest [facility] aseptically.  Electronically signed by Lois Huxley, RN at 08/26/2022  5:43 PM EST

## 2022-08-26 NOTE — Progress Notes (Signed)
Formatting of this note is different from the original.  [facility] ([facility]) Cardiology Specialists  Electrophysiology Consult Note    Patient Identification:  Patient: Hunter Higgins  MRN: [id number]  DOB: 08-Jan-1951   Hospital: [facility]  PCP-Kevin Terance Ice, MD    Reason for consult: bradycardia   Requesting provider: Dr. Rubie Maid    Outpatient Cardiologist: [facility] Dr. Charma Igo remotely  Attending EP: Dr. Neila Gear consulting     Problem List:    Intermittent 2nd degree type 1 AV block narrow QRS  Baseline 1st degree AV block  CAD   S/p cath showing severe 2 vessel CAD involving RCA and circumflex artery with moderate LAD and significant diagonal disease. Recommended for medical therapy   Chronic diastolic heart failure    Echo 12/24/2020 EF 51% moderate pulmonary HTN 58mmHg   ESRD on HD  Hypertension  PVD  Pulmonary HTN    Assessment & Plan:    Asymptomatic avoid AVN blocking agents  Would monitor for now can do 30 day event monitor upon discharged and outpatient follow up on 2-3 months     Pt not available in room.    Appears to have asymptomatic second degree type 1 AV block.  Monitor and anticipate need for cardiology fu for CAD management.      Myles Rosenthal, MD    HPI:  Hunter Higgins is a 72 y.o. male we have been asked to see in consult for bradycardia. Patient far as he aware has no prior history of any rhythm issues. He does have history of CAD iwht prior cath showing 2 vessel CAD recommended for medical therapy. Patient denies any issues with palpitations no lightheaded unless during HD and his BP runs low no syncope. He came in for shortness of breath and found to have RSV infraglottic edema and posterior glottic stenosis likely related to a Viral infection and has been given steroids and recommended for follow up. He states his breath issues and swallowing issues are intermittent. While on telemetry It was noted for him to have intermittent wenkebach and cardiology was consulted.     Past  Medical History:   The following medical history was reviewed.    Medical History:  Past Medical History:   Diagnosis Date    Abnormal nuclear stress test     Chronic diastolic (congestive) heart failure (HCC)     Chronic kidney disease, stage IV (severe) (HCC)     Diabetic foot ulcer (HCC)     Diabetic retinopathy ([place])     DM2 (diabetes mellitus, type 2) ([place])     Essential (primary) hypertension     GERD (gastroesophageal reflux disease)     Male erectile dysfunction, unspecified     Peripheral arterial disease (HCC)     Prostate cancer (HCC)     Sarcoidosis, unspecified     In remission    SBO (small bowel obstruction) (HCC)     Vitreous hemorrhage ([place])      Surgical History:  Past Surgical History:   Procedure Laterality Date    EXPLORATORY LAPAROTOMY      Lysis of adhesions    HEART CATHETERIZATION  12/30/2020      HERNIA REPAIR      Inguinal    ORTHOPEDIC SURGERY      Shoulder    RADICAL PROSTATECTOMY      VENOUS ACCESS CATHETER INSERTION N/A 12/28/2020    Procedure: INSERTION, CATHETER, TUNNELED, FOR DIALYSIS;  Surgeon: Ned Grace, MD    VENOUS  ACCESS CATHETER REPLACEMENT N/A 04/09/2021    Procedure: REPLACEMENT, CATHETER, HEMODIALYSIS, TUNNELED;  Surgeon: Doris Cheadle, Memory Argue, MD       Social History:   Social History     Tobacco Use    Smoking status: Former    Smokeless tobacco: Never   Brewing technologist Use: Never used   Substance Use Topics    Alcohol use: Yes     Comment: Occasional social    Drug use: Never     Family History:   Family History   Problem Relation Age of Onset    Diabetes Mother      Allergies:   Allergies   Allergen Reactions    Naproxen gi distress     Home Medications:    Home Medication List - Marked as Reviewed on 08/23/22 1657   Medication Sig   acetaminophen (TYLENOL) 325 mg PO TABS Take 2 Tabs by Mouth Every 4 Hours As Needed for Fever or Pain.   aspirin 81 mg PO CHEW Take 1 Tab by Mouth Once a Day.   atorvastatin (LIPITOR) 20 mg PO TABS Take 1 Tab by Mouth Every Night at  Bedtime.   b complex-vitamin c-folic acid (NEPHRO/TRIPHROCAPS) 1 mg PO CAPS Take 1 Cap by Mouth Once a Day.   dulaglutide (TRULICITY) 1.02 HE/5.2 mL SC Pen Injector Inject 0.5 mL beneath the skin Every Sunday.   gabapentin (NEURONTIN) 300 mg PO CAPS Take 1 Cap by Mouth Once a Day. Only on Tuesday and Thursday   melatonin 3 mg PO TABS Take 1 Tab by Mouth nightly as needed.   multivitamin with minerals (ONE-A-DAY 50 PLUS PO) Take 1 Tab by Mouth Once a Day.     Inpatient Medications:  ampicillin/sulbactam (Unasyn) 3 g in NS 100 mL IVPB, 3 g, Q24H  aspirin, 81 mg, Daily  atorvastatin, 20 mg, QHS  dexAMETHasone (Decadron) IV/IM Orderable, 10 mg, BID Before Meals  [START ON 08/27/2022] dexAMETHasone (Decadron) IV/IM Orderable, 10 mg, Once  heparin, 5,000 Units, Q8H  Hold Medication Daily Until Reordered, 1 Each, On Call  insulin glargine, 6 Units, Q24H  insulin LISPRO, 1-6 Units, QAC & QHS  polyethylene glycol (Miralax) orderable, 17 g, Daily        REVIEW OF SYSTEMS:   Constitutional: No fevers or chills   Eyes: negative for blurred vision   HENT: negative for congestion   Cardiovascular: negative for chest pain, palpitations   Respiratory: positive for shortness of breath   Gastrointestinal: negative for blood in stool   Genitourinary: not assessed   Skin: not assessed   Endocrine: not assessed   Psychiatric: negative for hallucinations   Heme: positive for anemia   Allergies: not assessed   Musculoskeletal: negative for falls   Neurological:  negative for focal weakness     PHYSICAL EXAM:   BP 146/65   Pulse 71   Temp 98.2 F (36.8 C)   Resp 18   Ht 5\' 7"  (1.702 m)   Wt 76.1 kg (167 lb 12.3 oz)   SpO2 99%   BMI 26.28 kg/m   24 hour vital signs:  BP  Min: 120/59  Max: 172/76  Temp  Avg: 98.3 F (36.8 C)  Min: 97.2 F (36.2 C)  Max: 99 F (37.2 C)  Heart Rate  Avg: 72  Min: 61  Max: 79  Resp  Avg: 17.8  Min: 17  Max: 18  SpO2  Avg: 98.7 %  Min: 96 %  Max:  643 %  Systolic (32RJJ), OAC:166 , Min:120 , AYT:016    Diastolic (01UXN), ATF:57, Min:59, Max:79    Body mass index is 26.28 kg/m.  No intake or output data in the 24 hours ending 08/26/22 1449  Patient Vitals for the past 120 hrs:   Weight   08/23/22 1010 70.3 kg (154 lb 15.7 oz)   08/25/22 1603 76.1 kg (167 lb 12.3 oz)     CONSTITUTIONAL:   well developed, nourished, no distress and VS reviewed   ENMT normocephalic and no hearing impairment   EYES:  conjunctiva normal and EOM normal   NECK: supple without masses, no thyromegaly, and normal ROM   CARDIOVASCULAR:  regular rate and rhythm, no murmur, rub or gallop, 2+ bilateral dorsalis pedis pulses, and no carotid bruit  No edema   PULMONARY: effort normal, clear to auscultation bilaterally, and nontender to palpation   ABDOMINAL: soft, nontender, no hepatosplenomegaly, and nondistended   GENITOURINARY: deferred   MUSCULOSKELETAL: normal ROM and no clubbing of digits   PSYCHIATRIC: Demonstrates good judgement and insight into his or her medical condition, alert and oriented x 3, memory intact   SKIN: normal and dry     LAB STUDIES:    Lab Review:  Recent Labs     08/26/22  0340 08/25/22  0437 08/24/22  1412   WBC 8.3 11.4* 11.4*   RBC 4.42 4.22 5.34   HEMOGLOBIN 10.0* 9.6* 12.0*   HCT 32.1* 30.7* 39.0   PLATELET 182 182 207     Recent Labs     08/26/22  0340 08/25/22  0437 08/24/22  0626   NA 136 136 135   POTASSIUM 5.3 6.0* 7.6*   CHLORIDE 96* 96* 98   CO2 25 24 22    ANIONGAP 15.0 16.0* 15.0   CALCIUM 8.5 8.9 9.2   BUN 49* 49* 72*   CREAT 7.7* 9.6* 13.0*   GLUCOSE 218* 191* 208*     CBC w/Diff  Lab Results   Component Value Date/Time    WBC 8.3 08/26/2022 03:40 AM    RBC 4.42 08/26/2022 03:40 AM    HEMOGLOBIN 10.0 (L) 08/26/2022 03:40 AM    HCT 32.1 (L) 08/26/2022 03:40 AM    MCV 73 (L) 08/26/2022 03:40 AM    MCH 23 (L) 08/26/2022 03:40 AM    MCHC 31 08/26/2022 03:40 AM    RDW 17.0 (H) 08/26/2022 03:40 AM    PLATELET 182 08/26/2022 03:40 AM    MPV 11.5 08/26/2022 03:40 AM    SEGS 87 (H) 08/26/2022 03:40 AM     LYMPHOCYTES 6 (L) 08/26/2022 03:40 AM    MONOS 7 08/26/2022 03:40 AM    EOS 0 08/26/2022 03:40 AM    BASOS 0 08/26/2022 03:40 AM      Basic Metabolic Profile  Lab Results   Component Value Date    NA 136 08/26/2022    POTASSIUM 5.3 08/26/2022    CHLORIDE 96 (L) 08/26/2022    CO2 25 08/26/2022    BUN 49 (H) 08/26/2022    CREAT 7.7 (H) 08/26/2022    GLUCOSE 218 (H) 08/26/2022    CALCIUM 8.5 08/26/2022    MAGNESIUM 2.1 08/26/2022    PHOSPHORUS 5.7 (H) 08/26/2022     Hepatic Function  Lab Results   Component Value Date    ALBUMIN 4.0 08/25/2022    TOTPR 7.0 08/25/2022    BILID <0.2 08/23/2022    BILIT 0.3 08/25/2022    SGPTALT 12 08/25/2022  SGOTAST 18 08/25/2022    ALKPHOS 79 08/25/2022    LIPASE 26 01/21/2022     Coagulation  Lab Results   Component Value Date    PT 11.3 08/23/2022    INR 1.02 08/23/2022    APTT 26 08/23/2022     Lab Results   Component Value Date/Time    TSH 1.79 12/24/2020 01:12 AM       CARDIAC DIAGNOSTICS:    EKG independently reviewed: Normal sinus rhythm with first degree AV block 68    Echocardiogram  Results for orders placed or performed during the hospital encounter of 12/23/20   Echo Cardiogram Complete   Result Value Ref Range    EF Echo 51     Narrative    CONCLUSIONS    * Left ventricular chamber size is normal.    * Moderate pulmonary hypertension, estimated pulmonary arterial systolic  pressure is 65 mmHg.    * Left ventricular systolic function is mildly reduced with an ejection  fraction of 51 % by Simpson's biplane.    * There is mild posterior wall hypertrophy.    * Left ventricular diastolic function: Grade III diastolic dysfunction.    * Right ventricular chamber size is normal with mildly reduced free wall  systolic function. FAC is 29%.    * There is mild, eccentric aortic valve regurgitation.    * Definity used for delineation of endocardial borders.    Comparison    * Compared to prior study from outside facility dated 01/01/20: Previous EF  of 60-65%, no WMA, grade II  diastolic dysfunction, RA pressure of 3 mmHg, PAP  not estimated.    Patient Info  Name:     TIANDRE TEALL  Age:     38 years  DOB:     [birth year]  Gender:     Male  MRN:     [id number]  Ht:     90 in  Wt:     168 lb  BSA:     1.90 m2  BP:     191  /     88 mmHg  Exam Date:     12/24/2020 8:33 AM  Patient Status:     Inpatient    Exam Type:     ECHO CARDIOGRAM COMPLETE    Indications       - shortness of breath    Left Ventricle    Left ventricular chamber size is normal.    Left ventricular systolic function is mildly reduced with an ejection  fraction of 51 % by Simpson's biplane.    The anterolateral, mid to apical inferolateral, and basal to mid anterior  walls are hypokinetic/akinetic.    All other walls appear normal.    There is mild posterior wall hypertrophy.    Left ventricular diastolic function: Grade III diastolic dysfunction.    Right Ventricle    Right ventricular chamber size is normal with mildly reduced free wall  systolic function. FAC is 29%.    TAPSE measures 1.4 cm.    Left Atrium    Left atrial chamber is mildly enlarged with a left atrial volume index of  36.00 ml/m^2 by BP MOD.    Right Atrium    Right atrial chamber size is normal.    Aortic Valve    The aortic valve is tricuspid with minimal sclerosis.    There is no aortic valve stenosis.    There is mild, eccentric aortic valve regurgitation.  Pulmonic Valve    The pulmonic valve is normal.    There is no pulmonic valve stenosis.    There is mild pulmonic regurgitation.    Mitral Valve    The mitral valve has mildly thickened leaflets.    There is no mitral valve stenosis.    There is trace mitral valve regurgitation.    Tricuspid Valve    The tricuspid valve leaflets are normal.    There is no tricuspid valve stenosis.    There is trace tricuspid valve regurgitation.    Moderate pulmonary hypertension, estimated pulmonary arterial systolic  pressure is 65 mmHg.    Pericardium/Pleural    There is no pericardial effusion.    Inferior  Vena Cava    Dilated inferior vena cava with >50% collapse upon inspiration consistent  with significantly elevated right atrial pressure, 15 mmHg.    Aorta    The prox ascending aorta is upper normal measuring 3.5 cm with an index of  1.86 cm/m2.    The aortic root at the sinus of Valsalva is normal measuring 3.1 cm with an  index of 1.63 cm/m2.    The aortic measurements are indexed to age and body surface area.    Mitral Valve  ----------------------------------------------------------------------  Name                                 Value        Normal  ----------------------------------------------------------------------    MV Doppler  ----------------------------------------------------------------------  MV Decel Slope                   714 cm/s2                 MV PHT                               39 ms                 MV Area (PHT)                     5.64 cm2     5.78-4.69     MV Diastolic Function  ----------------------------------------------------------------------  MV E Peak Velocity              95.50 cm/s                 MV A Peak Velocity              39.40 cm/s                 MV E/A                                2.42        <=1.60   MV Decel Time PW                    134 ms                   MV Annular TDI  ----------------------------------------------------------------------  MV Septal e' Velocity            5.87 cm/s        >=7.00   MV Lateral e' Velocity  5.11 cm/s       >=10.00   MV e' Average                         5.49                 MV E/e' (Average)                    17.48       <=14.00    Tricuspid Valve  ----------------------------------------------------------------------  Name                                 Value        Normal  ----------------------------------------------------------------------    TV Regurgitation Doppler  ----------------------------------------------------------------------  TR Peak Velocity               352.00 cm/s      <=280.00   TR Peak  Gradient                   42 mmHg                   Estimated PAP/RSVP  ----------------------------------------------------------------------  RA Pressure                        15 mmHg            <8   PA Systolic Pressure               65 mmHg           <57   RV Systolic Pressure               65 mmHg           <36    Aorta  ----------------------------------------------------------------------  Name                                 Value        Normal  ----------------------------------------------------------------------    Ascending Aorta  ----------------------------------------------------------------------  Sinus of Valsalva Diameter          3.1 cm         <=3.7   Sinus of Valsalva Index         1.63 cm/m2        <=1.90   Prox Asc Ao Diameter                3.5 cm         <=3.4   Prox Asc Ao Diameter Index      1.86 cm/m2        <=1.70    Venous  ----------------------------------------------------------------------  Name                                 Value        Normal  ----------------------------------------------------------------------    IVC/SVC  ----------------------------------------------------------------------  IVC Diameter (Exp 2D)              2.15 cm        <=2.10   IVC Diameter Percent Change  (2D)  11 %          >=50    Aortic Valve  ----------------------------------------------------------------------  Name                                 Value        Normal  ----------------------------------------------------------------------    AV Regurgitation Doppler  ----------------------------------------------------------------------  AR Decel Slope                 167.00 cm/s2                 AR PHT                              711 ms    Ventricles  ----------------------------------------------------------------------  Name                                 Value        Normal  ----------------------------------------------------------------------    LV  Dimensions 2D/MM  ----------------------------------------------------------------------  IVS Diastolic Thickness (2D)       1.02 cm        <=1.04   LVID Diastole (2D)                 4.54 cm     9.93-7.16   LVPW Diastolic Thickness  (2D)                               1.25 cm        <=1.04   LVID Systole (2D)                  3.17 cm     9.67-8.93   LVID Diastolic Index (2D)       2.39 cm/m2     2.20-3.00   LV Mass (2D Cubed)                185.51 g  88.00-224.00   LV Mass Index (2D Cubed)        0.01 g/cm2     0.00-0.01   Relative Wall Thickness (2D)          0.55                   LV Fractional Shortening/Ejection Fraction 2D/MM  ----------------------------------------------------------------------  LV EF (BP MOD)                        51 %         81-01   LV Diastolic Volume (BP MOD)     131.77 ml  75.10-258.52   LV Diastolic Volume Index  (BP MOD)                       69.34 ml/m2   77.82-42.35   LV Systolic Volume (BP MOD)       64.68 ml   36.14-43.15   LV Systolic Volume Index (BP  MOD)                           34.04 ml/m2   40.08-67.61   LV Diastolic Length (4C)           8.99 cm  LV Systolic Length (4C)            7.76 cm                 LV Stroke Volume (4C MOD)         64.16 ml                   RV Dimensions 2D/MM  ----------------------------------------------------------------------  RV Basal Diastolic Dimension       7.84 cm     6.96-2.95   RV Diastolic Area (4C)           18.00 cm2   28.41-32.44   RV Systolic Area (4C)            12.70 cm2    3.00-15.00   TAPSE                               1.4 cm         >=1.7     RV Fractional Shortening 2D  ----------------------------------------------------------------------  RV FAC (4C)                           29 %          >=35    Atria  ----------------------------------------------------------------------  Name                                 Value        Normal  ----------------------------------------------------------------------    LA  Dimensions  ----------------------------------------------------------------------  LA Dimension (2D)                  3.50 cm     3.00-4.10   LA Dimen Index (2D)             1.84 cm/m2                 LA Volume Index (BP MOD)       36.00 ml/m2   16.00-34.00     RA Dimensions  ----------------------------------------------------------------------  RA Diastolic Major Axis  Length (4C)                        4.75 cm                 RA Diastolic Major Axis  Length Index (4C)                     0.25                 RA Area (4C)                     16.90 cm2        <21.00   RA ESV (4C MOD)                   49.20 ml   18.00-32.00   RA ESV Index (4C MOD)          25.89 ml/m2       <=32.00    Report Signatures  Finalized by Percell Miller  MD on 12/24/2020 10:50 AM    Technical Quality:     Adequate image quality    Staff  Ordering Provider:     Maisie Fus  Referring Provider:     UDSEN, IAN  ;    Complete transthoracic echocardiogram performed with 2D imaging, color  Doppler, and spectral Doppler.    Ultrasound enhancing agent is used.    [id number]    Sonographer:     Carollee Leitz RCS    Image Enhancing Agent/Agitated Saline    ------------------------------  Imaging Agent/Ag. Saline:     Definity  Amount:     --- ml     Stress Test  No results found for this or any previous visit.    Merrily Brittle, [place]  Pager [phone] (available 7:30am-4:30pm M-F, if working the weekend then 7-11am)   Electronically signed by Kandis Ban, MD at 08/26/2022  6:59 PM EST

## 2022-08-26 NOTE — Case Communication (Signed)
Formatting of this note is different from the original.    ICM meet with patient to complete initial assessment. Introduced self and ICM role. Patient is independent with all adl's and lives alone. Patient stated his wife died last year and recently has a lot of family deaths. Patient daughter Jovita Gamma lives in [place] and is flying in today to see patient. PT/OT have not seen patient. Patient stated his brother will provide transport if discharged. ICM will continue to follow for any needs.     08/26/22 1530   Initial Assessment   Interview completed with Patient   Tell me more about why you came to the hospital. Trouble breathing   Where did the patient reside prior to admission? Home/Apartment   How many stairs into home? 0   Bedroom location? Downstairs   Bathroom location? Downstairs   Do you have someone who you would like involved in your care or would be available to assist you upon discharge? Yes   Patient Caregiver understands and accepts role? Yes   Patient Caregiver Name Jovita Gamma   Patient Caregiver Relationship daughter   Patient Caregiver Contact Information [phone]   How hard is it for you to pay for the very basics like food, housing, medication, medical care, transportation to these appts, and heating? No concerns   Functional Status prior to admission   Basic ADL's (Activities of Daily Living) - i.e. Personal Hygiene, Dressing, Eating, Continence, Transferring/Mobility Independent   Instrumental ADL's (IADL's) - i.e. Communication, Transportation, Meal Preparation, Shopping, Housework, Lexicographer ADL's (medical care) - i.e. Taking Medications, administering medical treatments Independent   Primary Care Physician (PCP) Verification Updated  Sherle Poe at [facility])   Did the patient have [place] prior to admission? No   Assistive devices used at [facility] None   Other Equipment None   DME Provider(s) n/a   Additional Resources   What other  community resources has the patient utilized in the past? Hemodialysis   Hemodialysis Schedule M/W/F   Hemodialysis Location [facility] [address], [place]   Does patient have prescription drug coverage? Yes   When you think about your health, what is most important to you? I want to get better   What are you and your family/caregivers preferences for where you go for care after the hospital? Home   Anticipated Transition Plan (Discharge Disposition) Home with [place] with Outpatient Services   Choice List Offered/Provided [place] list;Declined SNF list   Assessment Completed Case Management will continue to follow.     Lajuana Carry, MSN, RN, [facility]  [facility] Case Management Department  CM Office [phone]  Ascom/voicemail [phone]    -IF need any ICM assist after 1630pm or weekend contact ICM pager # [phone]. Thank you      Personal Protective Equipment used during this patient encounter: Mask, Goggles / Face Shield     Electronically signed by Antony Odea, RN at 08/26/2022  4:31 PM EST

## 2022-08-26 NOTE — Progress Notes (Signed)
Formatting of this note might be different from the original.  Notified by telemetry that pt having episodes of  2nd degree Type  I heart rhythm and strip has been scanned and saved. Hospitalist notified.  Electronically signed by Vista Mink, RN at 08/26/2022  2:06 AM EST

## 2022-08-26 NOTE — Case Communication (Signed)
Formatting of this note might be different from the original.  [facility] I would like to thank you for allowing  [facility] to assist with managing this referral.  Referral received from [facility]  RN/ICM. [facility] is unable to accept this referral due to patient's insurance not being in network with our agency/pharmacy. Returning to [facility] for outsourcing at this time.     For assistance please contact [phone].     Respectfully,  Baldo Ash  "[employer]"  [place] Transition Coordinator  [facility]    Electronically signed by Suanne Marker V at 08/26/2022  4:02 PM EST

## 2022-08-26 NOTE — Progress Notes (Signed)
Formatting of this note might be different from the original.  Returns to unit from dialysis in stable condition. Dinner provided.   Electronically signed by Vista Mink, RN at 08/27/2022  2:43 AM EST

## 2022-08-26 NOTE — Progress Notes (Signed)
Formatting of this note is different from the original.  Images from the original note were not included.      Impression:  ESKD              -[place] [place]               -MWF              -R TDC              -3 hours  Presents with dyspnea/wheezing x 3 days              -resp inspiratory stridor               -evaluated by ENT, started on Decadron              -?neck mass, infraflottic edema   RSV infection   HTN  DM  Hyperkalemia       Afebrile  BP 140s-180s  99% RA  S/p HD with 1L removed yesterday (extra treatment for high K)  No new labs  Tele episodes of type I HB reported, K was 6 but received HD.  Recheck pending        Plan:  Routine HD today to get back on schedule  Check pending labs  Check phos  No need for epogen     Will check back Monday     Patient Active Problem List   Diagnosis    NSTEMI (non-ST elevated myocardial infarction) ([place])    Severe protein-calorie malnutrition ([facility])    ESRD (end stage renal disease) ([place])    Stridor    Biphasic stridor    Airway compromise    Neck mass    Difficult airway    Glottic edema    Posterior glottic stenosis     In addition to above plan:  Monitor volume status   Check daily weights  Check labs  Replete lytes PRN  Interval history, events, and chart reviewed    Subjective:    No chest pain  No SOB  No emesis  No chills  No abd pain  No pruritus  No change in appetite    Physical Exam:    Patient Vitals for the past 24 hrs:   Temp Heart Rate Pulse Resp BP BP Mean SpO2 Weight   08/26/22 0828 98.1 F (36.7 C) -- 66 18 120/59 79 MM HG 99 % --   08/26/22 0321 98.2 F (36.8 C) -- 72 17 151/70 (!) 101 MM HG 96 % --   08/26/22 0148 -- 68 -- -- -- -- -- --   08/26/22 0134 -- 61 -- -- -- -- -- --   08/25/22 2355 -- 76 -- -- -- -- -- --   08/25/22 2332 97.2 F (36.2 C) -- 71 18 126/79 97 MM HG -- --   08/25/22 2049 98.5 F (36.9 C) -- 79 -- 172/76 (!) 109 MM HG 99 % --   08/25/22 2045 98.8 F (37.1 C) 76 76 18 165/72 (!) 103 MM HG 100 % --   08/25/22 1903 -- 79 --  -- -- -- -- --   08/25/22 1603 99 F (37.2 C) -- 78 18 153/68 98 MM HG 99 % 76.1 kg (167 lb 12.3 oz)   08/25/22 1326 98.2 F (36.8 C) -- 78 19 134/66 75 MM HG 99 % --   08/25/22 1319 -- -- 76 19 96/51 75 MM HG -- --   08/25/22  1315 -- -- 75 19 110/54 73 MM HG -- --   08/25/22 1300 -- -- 78 19 109/54 82 MM HG -- --   08/25/22 1245 -- -- 81 19 112/60 89 MM HG -- --   08/25/22 1230 -- -- 76 19 115/58 78 MM HG -- --   08/25/22 1215 -- -- 73 17 100/63 78 MM HG -- --   08/25/22 1200 -- -- 74 19 142/76 (!) 102 MM HG -- --   08/25/22 1145 -- -- 73 19 159/76 (!) 123 MM HG -- --     Wt Readings from Last 4 Encounters:   08/25/22 76.1 kg (167 lb 12.3 oz)   01/21/22 70.3 kg (155 lb)   12/21/21 70.4 kg (155 lb 3.3 oz)   07/25/21 63.5 kg (140 lb)     Intake/Output Summary (Last 24 hours) at 08/26/2022 1131  Last data filed at 08/25/2022 1319  Gross per 24 hour   Intake 250 ml   Output 1001 ml   Net -751 ml     OP clear   CV: RRR  Lungs: Clear  Abd: Soft, Nontender, bowel sounds present  Extrem: No edema    Labs:    CBC w/Diff    Recent Labs     08/26/22  0340 08/25/22  0437 08/24/22  1412   WBC 8.3 11.4* 11.4*   RBC 4.42 4.22 5.34   HEMOGLOBIN 10.0* 9.6* 12.0*   HCT 32.1* 30.7* 39.0   MCV 73* 73* 73*   MCH 23* 23* 23*   MCHC 31 31 31    RDW 17.0* 16.6* 16.6*   PLATELET 182 182 207   MPV 11.5 11.2 10.0    Recent Labs     08/26/22  0340 08/25/22  0437   SEGS 87* 86*   LYMPHOCYTES 6* 6*   MONOS 7 8   EOS 0 0   BASOS 0 0   RDW 17.0* 16.6*       Comprehensive Metabolic Profile    Recent Labs     08/26/22  0340 08/25/22  0437 08/24/22  0626   NA 136 136 135   POTASSIUM 5.3 6.0* 7.6*   CHLORIDE 96* 96* 98   CO2 25 24 22    ANIONGAP 15.0 16.0* 15.0   BUN 49* 49* 72*   CREAT 7.7* 9.6* 13.0*   GLUCOSE 218* 191* 208*    Recent Labs     08/26/22  0340 08/25/22  0437 08/24/22  0626 08/23/22  1809   CALCIUM 8.5 8.9 9.2  --    PHOSPHORUS 5.7*  --   --   --    ALBUMIN  --  4.0  --  4.3   TOTPR  --  7.0  --  7.6   ALKPHOS  --  79  --  108   SGOTAST   --  18  --  18   SGPTALT  --  12  --  17   BILIT  --  0.3  --  0.3         Lab Results   Component Value Date    CREAT 7.7 (H) 08/26/2022    CREAT 9.6 (H) 08/25/2022    CREAT 13.0 (H) 08/24/2022    CREAT 11.3 (H) 08/23/2022    CREAT 9.9 (H) 01/21/2022     Lab Results   Component Value Date    PHOSPHORUS 5.7 (H) 08/26/2022    PHOSPHORUS 3.9 01/07/2021    PHOSPHORUS 2.9 01/06/2021  PHOSPHORUS 3.0 01/05/2021    PHOSPHORUS 3.4 01/04/2021     Lab Results   Component Value Date    CALCIUMIONIZ 4.3 (L) 12/21/2021    CALCIUMIONIZ 5.1 04/09/2021    CALCIUMIONIZ 4.3 (L) 12/24/2020     Lab Results   Component Value Date    WBC 8.3 08/26/2022    WBC 11.4 (H) 08/25/2022    WBC 11.4 (H) 08/24/2022    WBC 6.6 08/23/2022    WBC 5.1 08/23/2022     Lab Results   Component Value Date    HEMOGLOBIN 10.0 (L) 08/26/2022    HEMOGLOBIN 9.6 (L) 08/25/2022    HEMOGLOBIN 12.0 (L) 08/24/2022    HEMOGLOBIN 10.0 (L) 08/23/2022    HEMOGLOBIN 10.6 (L) 08/23/2022     Lab Results   Component Value Date    PLATELET 182 08/26/2022    PLATELET 182 08/25/2022    PLATELET 207 08/24/2022    PLATELET 175 08/23/2022    PLATELET 184 08/23/2022     Lab Results   Component Value Date    ALBUMIN 4.0 08/25/2022    ALBUMIN 4.3 08/23/2022    ALBUMIN 4.1 01/21/2022    ALBUMIN 2.6 (L) 01/07/2021    ALBUMIN 2.7 (L) 01/06/2021     Lab Results   Component Value Date    MAGNESIUM 2.1 08/26/2022    MAGNESIUM 2.0 12/11/2020     Lab Results   Component Value Date    IRON 34 (L) 12/24/2020     Lab Results   Component Value Date    FERRITIN 185 12/24/2020     Lab Results   Component Value Date    PTHINT 31 12/24/2020     Urinalysis with Microscopy:   Lab Results   Component Value Date    UGLU 150* (A) 12/11/2020    UBILIRUBIN Negative 12/11/2020    UKET 5* (A) 12/11/2020    USPG 1.017 12/11/2020    UOCB Negative 12/11/2020    UPH 7.0 12/11/2020    UPSC >=500* (A) 12/11/2020    UUROBILISQ <2.0 12/11/2020    UNITR Negative 12/11/2020    URINELEUKOC Negative 12/11/2020     UWBC 3-5 (A) 12/11/2020    URBC 3-5 (A) 12/11/2020       No results found for this visit on 08/23/22.    Hospital Encounter on 08/23/22   Blood Culture (2 of 2)    Collection Time: 08/23/22  6:10 PM    Specimen: Blood   Result Value Status    Culture No Growth at 48 hours Preliminary   Blood Culture (1 of 2)    Collection Time: 08/23/22  6:09 PM    Specimen: Blood   Result Value Status    Culture No Growth at 48 hours Preliminary     Meds reviewed in [facility]    Thank You      Electronically signed by Ronald Lobo, MD at 08/26/2022 11:31 AM EST

## 2022-08-26 NOTE — Case Communication (Signed)
Formatting of this note might be different from the original.      Barriers to Discharge: PT/OT eval  Expected Day of Discharge: 08/27/22  Discharge Planned Disposition: SNF vs HH    Current LOS/GMLOS:  3/3.1     ICM meet with patient to discuss discharge plan and he is agreeable to go to SNF if need. Patient has no SNF preference PT/OT still need to see patient to start insurance auth. ICM called PT/OT to see patient today. ICM proactively sent out SNF and [facility] referrals.    ICM SNF Transition Planning     Communication:   Communication Needs: None  Patient Preference:    Communication Needs of Companion/Other: None  Companion/Other Preference:      Cultural Considerations Discussed: Family/caregiver/social support    ICM met with patient at bedside  to discuss transition planning.Patient is agreeable if PT/OT are recommending and has no SNF preference Discussed expected discharge date on 08/30/22 which is in 4 days.  ICM discussed that SNF referrals are sent to the preferred provider network to allow for an expanded search. Confirmed patient's facility preferences are : Patient has SNF preference.    ICM will proceed with confirming accepting facilities, bed availability, and authorization (when applicable).     [facility] have accepted patient. Currently no accepting facilities.     Lajuana Carry, MSN, RN, ICM  [facility] Case Management Department  CM Office [phone]  Ascom/voicemail [phone]    -IF need any ICM assist after 1630pm or weekend contact please call ICM pager # [phone].Thank you    Electronically signed by Antony Odea, RN at 08/26/2022  4:33 PM EST

## 2022-08-26 NOTE — Care Plan (Signed)
Formatting of this note might be different from the original.    Problem: Adult Inpatient Plan of Care  Goal: Plan of Care Review  Outcome: Progressing  Flowsheets (Taken 08/26/2022 0409)  Progress: improving  Plan of Care Reviewed With: patient  Goal: Patient-Specific Goal (Individualized)  Outcome: Progressing  Flowsheets (Taken 08/26/2022 0409)  Patient/Family-Specific Goals (Include Timeframe): Pt will not have difficulty breathing by discharge date  Goal: Absence of Hospital-Acquired Illness or Injury  Outcome: Progressing    Problem: Infection  Goal: Absence of Infection Signs and Symptoms  Outcome: Progressing    Problem: Hemodialysis  Goal: Safe, Effective Therapy Delivery  Outcome: Progressing  Goal: Effective Tissue Perfusion  Outcome: Progressing  Goal: Absence of Infection Signs and Symptoms  Outcome: Progressing    Problem: Fall Prevention  Goal: Prevent/Manage Accidental Injury (Falls)  Description: Consider requesting a pharmacologic review as needed.  Consider asking the MD to consult PT / OT for an evaluation.  Outcome: Progressing    Problem: Skin Injury Risk Increased  Goal: Skin Health and Integrity  Outcome: Progressing    Problem: Gas Exchange Impaired  Goal: Optimal Gas Exchange  Outcome: Progressing    Electronically signed by Vista Mink, RN at 08/26/2022  4:10 AM EST

## 2022-08-26 NOTE — Progress Notes (Signed)
Formatting of this note might be different from the original.  Pre HD report received from [place].:  Patient alert oriented x 4, with pending labs, with telemetry box #11, sinus rhythm , With Right chest TDC, VSS for HD in RDU, On contact/droplet precautions for RSV.  Electronically signed by Lois Huxley, RN at 08/26/2022  5:40 PM EST

## 2022-08-26 NOTE — Progress Notes (Signed)
Formatting of this note might be different from the original.  On Call Note    Notified by nurse, per tele pt in 2nd degree heart block type 1. Pt asymptomatic and not on CCB or BB. Pt had K+ of 6 on yesterday am and had HD yesterday.    - EKG  - Obtain am labs now to recheck K+ and will add Mg    Heather M. Ruen, AGACNP-BC, FNP-BC  [facility], [place]  After Hour pager [phone]            Electronically signed by Kallie Edward, NP at 08/26/2022  3:48 AM EST

## 2022-08-27 NOTE — Progress Notes (Signed)
Formatting of this note might be different from the original.  Non-Invasive Cardiology- Monitor was ordered at discharge, and discharge date is currently unknown.  For placement M-F until 3:00 pm, call NIV Charge Nurse @ [phone] by 2 pm. Any calls received after 2 pm, monitor will be placed the next day.     Weekends, page "Cardiac Stress [place]"  through [place] by 9 AM for same day placement. Notifications received after 9 am, will be completed the next day.       Electronically signed by Joretta Bachelor, RN at 08/27/2022 10:28 AM EST

## 2022-08-27 NOTE — Care Plan (Signed)
Formatting of this note might be different from the original.    Problem: Adult Inpatient Plan of Care  Goal: Plan of Care Review  Outcome: Progressing  Flowsheets (Taken 08/27/2022 0045)  Progress: improving  Plan of Care Reviewed With: patient  Goal: Patient-Specific Goal (Individualized)  Outcome: Progressing  Flowsheets (Taken 08/26/2022 0409)  Patient/Family-Specific Goals (Include Timeframe): Pt will not have difficulty breathing by discharge date  Goal: Absence of Hospital-Acquired Illness or Injury  Outcome: Progressing  Goal: Optimal Comfort and Wellbeing  Outcome: Progressing    Problem: Infection  Goal: Absence of Infection Signs and Symptoms  Outcome: Progressing    Problem: Hemodialysis  Goal: Safe, Effective Therapy Delivery  Outcome: Progressing  Goal: Effective Tissue Perfusion  Outcome: Progressing  Goal: Absence of Infection Signs and Symptoms  Outcome: Progressing    Problem: Fall Prevention  Goal: Prevent/Manage Accidental Injury (Falls)  Description: Consider requesting a pharmacologic review as needed.  Consider asking the MD to consult PT / OT for an evaluation.  Outcome: Progressing    Problem: Skin Injury Risk Increased  Goal: Skin Health and Integrity  Outcome: Progressing    Problem: Gas Exchange Impaired  Goal: Optimal Gas Exchange  Outcome: Progressing    Electronically signed by Vista Mink, RN at 08/27/2022 12:49 AM EST

## 2022-08-27 NOTE — Discharge Summary (Signed)
Formatting of this note is different from the original.  Discharge Summary - [place] is a 72 y.o.  male. DOB: [birth year]   MRN: [id number]    Attending Physician: Leretha Dykes, MD  PCP: Huston Foley, MD    Transition of Care     Admit Date: 08/23/2022          D/C Date: 08/27/2022           Patient Class: Inpatient     Primary Discharge Diagnosis:   Infraglottic edema secondary to RSV infection.    Patient high risk for readmission: No  Advanced Care Planning (ACP) documented this admission: No  Code Status at Time of Discharge:  Full Code          Discharge Medication List       START taking these medications      amoxicillin-clavulanate 500-125 mg Tabs  End date: September 01, 2022  Take 1 Tab by Mouth Once a Day for 5 doses. Indications: ENT  Dispense: 5 Tab  Refills: 0  Commonly known as: Augmentin    dexAMETHasone 4 mg Tabs  1 tab twice daily for 3 days then 1/2 tab twice daily for 3 days then 1/2 tab once daily for 3 days then stop.  Dispense: 12 Tab  Refills: 0  Commonly known as: Decadron    guaiFENesin 100 mg/5 mL Liqd  Take 10 mL by Mouth Every 4 Hours As Needed for Cough.  Dispense: 236 mL  Refills: 0  Commonly known as: Robitussin          CONTINUE taking these medications      aspirin 81 mg Chew  Take 1 Tab by Mouth Once a Day.  Dispense: 30 Tab  Refills: 1    atorvastatin 20 mg Tabs  Take 1 Tab by Mouth Every Night at Bedtime.  Dispense: 30 Tab  Refills: 1  Commonly known as: Lipitor    b complex-vitamin c-folic acid 1 mg Caps  Take 1 Cap by Mouth Once a Day.  Dispense: 30 Cap  Refills: 1  Commonly known as: Nephro/Triphrocaps    Trulicity 7.82 NF/6.2 mL Pen injector  Inject 0.5 mL beneath the skin Every Sunday.  Refills: 0  Generic drug: dulaglutide            Hospital Course     Chief Complaint/History of Present Illness:   Per Dr. Reece Agar:    Hunter Higgins is a 72 y.o. male with ESRD on MWF, CAD on medical management that is not amenable to CABG or  stenting, DMT2, diastolic heart failure presenting to the ED with shortness of breath that started yesterday morning.  He states that he went to dialysis yesterday, had a full run.  Was initially feeling better.  Symptoms recurred last night into this morning.  He went to his PCP this morning.  Sent to emergency department from PCP due to concern for stridor and low oxygen saturation.  Arrived in no acute distress, stable on nasal cannula.  Had some intermittent stridor that progressively worsened.  Scoped by ENT with evidence of limited mobility of vocal cords.  Given Decadron, racemic epi.  Evaluated by  laryngoscopy by ENT. Recommended for cricothyrotomy if needed for airway, ENT would prefer to do this in the OR possible. Noted to have poor motility of vocal cords with narrowing.     ICU consulted due to concern for airway watch. He denied any  other signs or symptoms including any viral symptoms, including sore throat, cough and congestion.  He has a large mass on the posterior neck/upper back has been evaluated and diagnosed as a lipoma in the past.  Otherwise he has no complaints today including no chest pain, no fevers or chills.  Started Trulicity over the weekend but otherwise has not introduced any new medications. No rashes or itching to suggest allergic reaction.    Problems Managed During Hospitalization:  1.  Infraglottic edema secondary to RSV infection.    2.  Coronary artery disease.  Coronary angiogram from 12/30/2020 describing severe two-vessel disease involving right coronary and circumflex artery with moderate left anterior descending artery stenosis.  Due to diffuse disease patient was not considered to be a candidate for CABG or PCI.  Importance of adhering to medical management including aspirin, statin was emphasized to patient.  Not on beta-blocker in view of below described issues with bradyarrhythmia.    3.  Second-degree AV block type I.  Intermittent junctional rhythm.  Continue  avoiding AV nodal blocking agents.  Follow-up outpatient with cardiology service.  Event monitoring suggested.    4. Chronic diastolic congestive heart failure.  Volume management with hemodialysis.  Not on beta-blocker in view of above described bradyarrhythmia.  Unlikely patient will tolerate ARB or ARNI in view of issues with hypotension during dialysis.    5.  End-stage renal disease.  On hemodialysis on Mondays Wednesdays and Fridays via right IJ TDC.  Hyperkalemia resolved with dialysis.  Advised to adhere to renal diet restrictions.    6.  Diabetes mellitus type 2.  On Trulicity at home.  Expect improvement of glycemic control with tapering systemic steroids off.  Advised to adhere to diabetic diet restrictions.    7.  Peripheral arterial disease.  History of anterior tibial angioplasty.  Stable currently.  Noted history of diabetic foot ulcer without any open ulcerations on current examination.  We will continue risk factor management as described above.  Patient is under vascular surgery and podiatry supervision outpatient.    8.  Hypertension, primary.  Not on any antihypertensive remedies currently.  Noted low blood pressures during dialysis.    9.  Microcytic anemia.  Likely anemia of chronic kidney disease.  No reported history of hemoglobinopathy.  No evidence of hemolysis.  No evidence of active bleeding currently.  Age-appropriate endoscopic screening procedures should be coordinated outpatient per primary care team.  Iron replacement, Epogen per nephrology.    10.  Questionable history of sarcoidosis.  Appears to be in remission.  Not on any steroid therapy currently.    11.  History of prostate cancer.  Status post radical prostatectomy.  No evidence of recurrence, distant metastasis.    12.  GERD.  Continue antireflux maneuvers, PPI or H2 blocker as needed.    13.  History of small bowel obstruction.  Status post lysis of adhesions.  No issues currently.      14.  Full code.          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  Patient was initiated on systemic steroid therapy with Decadron.  As bacterial infection cannot entirely be ruled out Unasyn was added to management.  With above described intervention patient's clinical condition significantly improved.  In  coordination with ENT service it was decided to transition patient to oral systemic steroid therapy with gradual taper in addition to oral antibiotic Augmentin to complete 5 more day course of treatment.  Outpatient follow-up was coordinated.  In stable condition patient was scheduled to be transitioned home on 08/28/2022.        During this admission, a clinical scoring tool indicated that this patient may benefit from Advanced Illness Management Services (AIM- including Advance Care Planning, Palliative Care Medicine, or Hospice Services).    Discharge Status     Physical Exam  HENT:      Mouth/Throat:      Pharynx: Oropharynx is clear. No oropharyngeal exudate.   Eyes:      Conjunctiva/sclera: Conjunctivae normal.   Cardiovascular:      Rate and Rhythm: Normal rate and regular rhythm.      Heart sounds: Normal heart sounds.   Pulmonary:      Effort: Pulmonary effort is normal.      Breath sounds: Normal breath sounds. No stridor.   Abdominal:      General: Abdomen is flat. Bowel sounds are normal. There is no distension.      Palpations: Abdomen is soft.      Tenderness: There is no abdominal tenderness.   Musculoskeletal:      Right lower leg: No edema.      Left lower leg: No edema.   Lymphadenopathy:      Cervical: No cervical adenopathy.   Skin:     General: Skin is warm.      Findings: No rash.      Comments: Large lipoma posterior neck base.   Neurological:      General: No focal deficit  present.      Mental Status: He is oriented to person, place, and time.     BP: 106/52  Heart Rate: 72 (08/27/22 1454)  Pulse: 72 (08/27/22 1454)  Temp: 98.3 F (36.8 C)  Resp: 16  Height: 67" (170.2 cm)  Weight: 73.3 kg (161 lb 8 oz)  BMI (Calculated): 24.27  SpO2: 97 %  Flow (L/min) (Oxygen Therapy): 2  Temp (24hrs), Avg:98.1 F (36.7 C), Min:97.2 F (36.2 C), Max:98.5 F (36.9 C)      Patient status at discharge: afebrile, ambulating and improved  Disposition of patient at discharge: home with home health        Discharge Labs/diagnostic studies:    CBC w/Diff    Lab Results   Component Value Date/Time     WBC 5.8 08/27/2022 05:06 AM     RBC 4.29 08/27/2022 05:06 AM     HEMOGLOBIN 9.7 (L) 08/27/2022 05:06 AM     HCT 31.0 (L) 08/27/2022 05:06 AM     MCV 72 (L) 08/27/2022 05:06 AM     MCH 23 (L) 08/27/2022 05:06 AM     MCHC 31 08/27/2022 05:06 AM     RDW 16.6 (H) 08/27/2022 05:06 AM     PLATELET 176 08/27/2022 05:06 AM     MPV 11.2 08/27/2022 05:06 AM     SEGS 87 (H) 08/26/2022 03:40 AM     LYMPHOCYTES 6 (L) 08/26/2022 03:40 AM     MONOS 7 08/26/2022 03:40 AM     EOS 0 08/26/2022 03:40 AM     BASOS 0 08/26/2022 03:40 AM      Basic Metabolic Profile    Lab Results   Component Value Date     NA 135 08/27/2022  POTASSIUM 5.3 08/27/2022     CHLORIDE 97 (L) 08/27/2022     CO2 25 08/27/2022     BUN 33 (H) 08/27/2022     CREAT 6.1 (H) 08/27/2022     GLUCOSE 358 (H) 08/27/2022     CALCIUM 8.5 08/27/2022     MAGNESIUM 2.1 08/26/2022     PHOSPHORUS 5.7 (H) 08/26/2022       Lab Results   Component Value Date     ALBUMIN 4.0 08/25/2022     TOTPR 7.0 08/25/2022     SGOTAST 18 08/25/2022     SGPTALT 12 08/25/2022     ALKPHOS 79 08/25/2022     AGRAT 1.3 08/25/2022     BILIT 0.3 08/25/2022     BILID <0.2 08/23/2022     GLOBULIN 3.0 08/25/2022     Coagulation    Lab Results   Component Value Date     PT 11.3 08/23/2022     INR 1.02 08/23/2022     APTT 26 08/23/2022     Cardiac Enzymes    Lab Results   Component  Value Date     TROPQUANT 249 (H) 08/23/2022     BNP 4,493 (H) 08/23/2022         Lab Results   Component Value Date/Time     CHOLESTEROL 114 12/24/2020 01:12 AM     HDL 38 (L) 12/24/2020 01:12 AM     LDLIPO 39 (L) 12/24/2020 01:12 AM     TRIGLYCERIDE 186 (H) 12/24/2020 01:12 AM     Thyroid Studies       Lab Results   Component Value Date/Time     TSH 1.79 12/24/2020 01:12 AM    No results found for: "T3UPTAKE"     HBs Ag  None Detected None Detected     CT chest 08/23/22:   1. Perihilar and mediastinal calcified lymphadenopathy, likely sequela of granulomatous disease.   2. Large lipoma at the left upper back.   3. Hepatic steatosis.   4. 1.2 cm cystic lesion in the pancreas, likely IPMN. Recommend nonemergent MRI with and without contrast on a nonemergent outpatient basis for better characterization.   5. Renal osteodystrophy.     CT ST neck 08/23/22:   1.Symmetric slitlike narrowing of the airway at the level of the true vocal cords, may represent glottic stenosis. No supporting enlargement of the piriform sinuses. No nodular masslike regions. No significant inflammation.   2. Mild chronic ethmoid sinusitis .   3. Severe cervical degenerative disc disease..     CT head 08/23/22:   No acute intracranial abnormality.   Mild periventricular chronic microvascular ischemic disease.   Mild generalized volume loss.   Mild chronic ethmoid sinusitis.     CT c/a/p 01/21/22:   1. Small hiatal hernia     2. Partially fluid-filled colon may suggest a diarrheal illness.     3. Small renal cysts.     4. Prostatectomy with pelvic lymph node dissection.       VQ scan 12/23/2020:Study is indeterminate/nondiagnostic regarding question of pulmonary embolism by virtue of right basilar perfusion defect with matching right lung opacity.    Chest x-ray 12/23/2020:1. Small right effusion with possible underlying parenchymal disease at the right base. Mild cardiomegaly with possible mild CHF.    MRI foot left 09/10/2020:Soft tissue irregularity along  the lateral plantar midfoot at the edge of the   examination likely related to known soft tissue ulcer. No definite evidence of  underlying bone marrow infiltration to suggest osteomyelitis. Continued   follow-up can be obtained. Please note hindfoot ulcers are better characterized   on ankle MRI. Bone scan can also be considered.     Midfoot findings likely related to combination of neuropathic arthropathy and   osteoarthritis.     Extensive subcutaneous and muscular edema which can be associated with   uncontrolled diabetes/neuropathy.     Please see report for additional findings and full details.    MRI brain 03/03/2020:No cause for hearing loss identified. Negative for vestibular   schwannoma     Mild atrophy and mild chronic ischemic changes.     CT abdomen and pelvis 05/05/2019:1. Findings consistent with acute mechanical small bowel obstruction   with transition point within the mid abdomen as above.   2. No other acute intra-abdominal or pelvic process.   3. Moderate retained stool diffusely throughout the colon,   suggesting constipation.   4. Prior prostatectomy.      Nuclear stress test 10/09/2018:There is a small sized mild intensity reversible defect of the lateral wall    suggestive of ischemia    Fixed inferior defect with no associated wall motion abnormality consistent    with diaphragmatic attenuation artifact.    Normal LV function with a calculated EF of 63 %.    Signatures       Echocardiogram 01/01/2020:1. Left ventricular ejection fraction, by estimation, is 60 to 65%. The left ventricle has normal function. The left ventricle has no regional wall motion abnormalities. There is mild left ventricular hypertrophy. Left ventricular diastolic parameters   are consistent with Grade II diastolic dysfunction (pseudonormalization). Elevated left ventricular end-diastolic pressure.    2. Right ventricular systolic function is normal. The right ventricular size is normal.    3. The mitral valve is normal  in structure. Mild mitral valve regurgitation. No evidence of mitral stenosis.    4. The aortic valve is tricuspid. Aortic valve regurgitation is mild. No aortic stenosis is present.    5. The inferior vena cava is normal in size with greater than 50% respiratory variability, suggesting right atrial pressure of 3 mmHg.     Coronary angiogram 12/30/2020:  1.  Severe two-vessel disease involving the right coronary and   circumflex artery with a moderate left anterior descending artery   disease/significant diagonal disease.   2.  Mild pulmonary hypertension with a precapillary etiology.   3.  Normal right and left heart filling pressures   4.  Preserved LV systolic function EF 45% with mild global   hypokinesis   5.  No aortic stenosis   6.  Mild mitral vegetation   7.  Successful administration of moderate sedation without   complications.     Patient Instructions     Discharge Procedure Orders   Diet as Follows:    Diabetic 1800 CHO and Renal Diet     Restrictions as Follows:    Resume regular daily activities gradually.     Additional Instructions as Follows:    1. Complete antibiotic course with 5 days of Augmentin. 2. Taper Decadron (steroid) gradually as directed. You will notice elevated sugar values while on steroid. Try to minimize carbohydrate intake while on this medication. 3. Can use Guaifenesin as needed for cough, phlegm. 3. Please, resume Aspirin and Lipitor (cholesterol) medication for coronary artery disease.     Home Medical Equipment: Other Items    walker with wheels. Length of need = 99 months.  Home Care Services    I certify that the patient is homebound due to  decreased strength due to physcial debility secondary to infection, and requires the ordered home health services.     The patient is under my care. I have initiated the establishment of the plan of care. The patient will be followed by a physician who will periodically review the plan of care.     In compliance with the Affordable  Care Act, I certify that this Medicare patient is being managed by me during this hospitalization OR is under my care and that I, OR a resident, nurse practitioner, or physician assistant working with me, had a face-to-face encounter that meets the physician face-to-face encounter requirements with this patient on 08/27/2022.     Start of Services? < 48 hours    Home Health service(s) requested? Rehabilitative Therapies    Rehabilitative therapy requested? Physical Therapy    Rehabilitative therapy requested? Occupational Therapy    Occupational therapy service(s) requested? Functional Ambulation    Occupational therapy service(s) requested? Functional Transfers    Occupational therapy service(s) requested? MobilityTraining    Occupational therapy service(s) requested? Muscle Weakness    Physical therapy service(s) requested? Functional Ambulation    Physical therapy service(s) requested? Functional Transfers    Physical therapy service(s) requested? Mobility Training    Physical therapy service(s) requested? Muscle Weakness      Follow Up Info (next 45 days)       Schedule an appointment with Huston Foley, MD as soon as possible for a visit in 1 week(s)    Specialty: Internal Medicine    Phone: [phone]    Where: [facility]         Contact information for after-discharge care             [place]    Phone: [phone]    Fax: [phone]    Where: [address] [place] [address]    Service: [place]                 Documentation     Total Time Coordinating Discharge (minutes) : 50    Leretha Dykes, MD  08/27/2022 5:14 PM  Electronically signed by Leretha Dykes, MD at 08/27/2022  5:47 PM EST

## 2022-08-27 NOTE — Care Plan (Signed)
Formatting of this note might be different from the original.    Problem: Adult Inpatient Plan of Care  Goal: Plan of Care Review  Outcome: Progressing  Flowsheets (Taken 08/27/2022 1343)  Progress: improving  Outcome Evaluation: Patient will maintain a patent airway as evidenced by clear breath sounds, oxygen saturation within normal limits, and the ability to cough to clear secretions.  Plan of Care Reviewed With: patient  Goal: Patient-Specific Goal (Individualized)  Outcome: Progressing  Flowsheets (Taken 08/27/2022 1343)  Patient/Family-Specific Goals (Include Timeframe): Patient will maintain a patent airway as evidenced by clear breath sounds, oxygen saturation within normal limits, and the ability to cough to clear secretions.  Goal: Absence of Hospital-Acquired Illness or Injury  Outcome: Progressing  Goal: Optimal Comfort and Wellbeing  Outcome: Progressing  Goal: Readiness for Transition of Care  Outcome: Progressing    Electronically signed by Arlice Colt, RN at 08/27/2022  1:45 PM EST

## 2022-08-27 NOTE — Progress Notes (Signed)
Formatting of this note is different from the original.  Occupational Therapy Evaluation    General Info:   Room: K117/K117-01  Visit Time In: 0840  Visit Time Out: 0942    Precautions:   Weight Bearing Status: Weight bearing as tolerated  General Precautions: Universal precautions, Fall precautions, Contact precautions, Droplets precautions    History & Present Condition: Chart reviewed. 72 y.o. male w/ complex PMH including CAD and ESRD on HD presenting with worsening SOB and inspiratory stridor with FFL notable for poor VF abduction and infraglottic edema.  Admit for RSV+, PMH:  diabetic retinopathy, L plantar surface foot ulcer.    Home Environment/Prior Level of Function:   Lives With: Alone  Type of Home: Apartment  Home Layout: One level, Threshold entry  Bathroom Shower/Tub: Research scientist (physical sciences): None  Home Equipment: Rolling walker, Cane  Level of Independence: Independent with ADLs, Independent with IADLs, Independent with mobility  Prior Assistive Device Used: None  Receives Help From: Geologist, engineering: Retired  Vision: Herbalist blind  Hearing: Intact  Hand dominance: Right  Homemaking Responsibilities: Meal prep, Laundry, Education administrator, Bill paying/finance, Medication management, Shopping  Prior Function Comments: 3 falls since stopping using a RW last summer, last fall in Nov/Dec. Pt states he "choses" not to use his RW and post op shoe for L foot plantar surface ulcer. Wife died 1 year ago, multiple extended family passed away in last month, but does have some family in the area    Communication Needs:Communication Needs: None    Subjective: "My chest feels tight and I am still coughing up stuff."     Pain:   0-10: Pain Score: 5, Pain Type: Acute pain and Visceral pain, Pain Orientation: Mid, Pain Location: Chest and Throat, Pain Descriptors: Sore and Tightness, Pain Interventions: Repositioned and Nurse notified    Current Functional Status:   Cognition:    Orientation: Alert  Oriented x 4  Judgment: within functional limits  Problem Solving: within functional limits  Following Directions: within functional limits  Sequencing: within functional limits  Safety Awareness: within functional limits    Sensation/Perception:   Sensation:  grossly WFL     Vision: Vision Comments: reports legally blind with diabetic retinopathy  Proprioception: within functional limits  Praxis: within functional limits    ROM:          R UE:WFL  L UE: WFL     Strength:    R UE: Grossly 4/5   L UE: Grossly 4/5     Posture: WFL   Tone: WFL   Coordination: WFL    Balance:   Sitting Static: WFL   Sitting Dynamic: WFL   Standing Static: fair   Standing Dynamic: fair     Bed Mobility: Rolling/Turning: Modified Independent  Scooting/Bridging: Modified Independent  Supine to Sit: Modified Independent  Sit to Supine: Modified Independent  Transfers: Device(s) used: Ambulation/Transfer Devices: gait belt  Sit to/from stand: Supervision  Bed to/from chair: Supervision  SBA ambulate in room 20 feet x 2 frequent standing rest breaks due to sensations od SOB/DOE    ADLs:  Feeding: Modified Independent, inc time  Oral hygiene: Modified Independent  Grooming: Modified Independent, inc time  Bathing upper body: Set Up Assist, seated  Bathing lower body: Supervision, Set Up Assist, seated/ standing as needed, rest breaks required  Dressing upper body: Supervision, Set Up Assist, seated  Dressing lower body: Supervision, Set Up Assist, Stand By Assist, seated/standing as needed, rest  breaks required  Toilet hygiene: Supervision, Set Up Assist, in bathroom    Activity Tolerance: frequent rest breaks, complaints of SOB after standing activity, complaints of fatigue after standing activity, able to tolerate 4-6 minutes in standing , fatigues easily, unsteady, pain, functional deficit  Patient Education:  role of OT, ADL care, activity pacing and energy conservation, safety, community support    Additional  Treatment Provided:  Feeding: Modified Independent, inc time  Oral hygiene: Modified Independent  Grooming: Modified Independent, inc time  Bathing upper body: Set Up Assist, seated  Bathing lower body: Supervision, Set Up Assist, seated/ standing as needed, rest breaks required  Dressing upper body: Supervision, Set Up Assist, seated  Dressing lower body: Supervision, Set Up Assist, Stand By Assist, seated/standing as needed, rest breaks required  Toilet hygiene: Supervision, Set Up Assist, in bathroom  Bed Mobility: Rolling/Turning: Modified Independent  Scooting/Bridging: Modified Independent  Supine to Sit: Modified Independent  Sit to Supine: Modified Independent  Transfers: Device(s) used: Ambulation/Transfer Devices: gait belt  Sit to/from stand: Supervision  Bed to/from chair: Supervision  SBA ambulate in room 20 feet x 2 frequent standing rest breaks due to sensations od SOB/DOE    Safety & Handoff:    Repositioned: Sitting in chair  Safety: call light within reach, phone within reach, bedside table accessible, fall mat, side rails x3 up, nurse at bedside, and bed in low position    Rehab Potential: excellent    Assessment:  Pt seen for ADL care and functional mobility assessment.  Pt demo SBA functional mobility with slow gait, use of UE support on furniture for visual guidance 2/2 visual deficits in unfamiliar environment.  Pt demo SBA to mod indep ADL care tasks.  Pt c/o chest tightness, sore throat and cough in session.  Pt presents with gernalized deconditioning 2/2 viral infection, but able to complete self care with supervision, rest breaks and self pacing of activities.  Pt resides by self, consider d/c home with [facility] and CM support for PCA assessment.      Therapy Considerations on Discharge:  Therapy on Discharge: Home health therapy  Bathroom Equipment Recommended: Shower chair  Home Equipment Recommended: Rolling walker    Plan:    OT Therapy Plan: Discontinue Occupational Therapy (no acute  needs)  OT Frequency: Discontinue therapy    Treatment Dx     ICD-9-CM ICD-10-CM   1. Stridor  786.1 R06.1   2. End stage renal disease (HCC)  585.6 N18.6   3. Coronary artery disease involving native heart without angina pectoris, unspecified vessel or lesion type  414.01 I25.10     Personal Protective Equipment was used including;  goggles, mask-N95, hands-gloves, and body-gown.  Patient was not masked.    Thank you,   Santiago Glad L. Karren Cobble  [facility] Acute Care Therapy  Dept # [phone]    Electronically signed by Archie Patten, OT at 08/27/2022 10:14 AM EST

## 2022-08-27 NOTE — Progress Notes (Signed)
Formatting of this note might be different from the original.  Brief EP note:     Tele: SR, intermittent junctional overnight VR in the 50s and wenkebach. No evidence of high degree AV block at this time.    Avoid AVN agents  30 day HM at discharge  Follow up with general cardiology for long term CAD management (message sent)  Tele check tomorrow    Maxwell [facility] Cardiology- Electrophysiology  Available via secure chat M-F [phone]    Electronically signed by Hayden Rasmussen, DO at 08/28/2022  5:26 AM EST

## 2022-08-27 NOTE — Progress Notes (Signed)
Formatting of this note might be different from the original.  Physical Therapy Treatment    General Info:  Room #:  T614/E315-40  Visit Time In: 1530  Visit Time Out: 1640    Precautions:   Weight Bearing Status: Weight bearing as tolerated  General Precautions: Universal precautions, Fall precautions, Contact precautions, Droplets precautions    Home Environment/Prior Level of Function:   Lives With: Alone  Type of Home: Apartment  Home Layout: One level, Threshold entry  Home Equipment: Rolling walker, Cane  Level of Independence: Independent with ADLs, Independent with IADLs, Independent with mobility  Prior Assistive Device Used: None  Prior Function Comments: 3 falls since stopping using a RW, last time in Nov/Dec. Pt states he "choses" not to use his RW. Wife recently died, but does have some family in the area    Communication Needs: Communication Needs: None    Subjective:  Agreeable    Pain:    No/denies pain    Treatment Provided:  Transfers: Device(s) used: none  Sit to/from stand: Catering manager, Programmer, systems: Level of Assistance: Stand By Assist, Catering manager, Verbal Cues  DME: No Device and Gait Belt  Distance: 305 feet (+ 165ft)    Activity Tolerance:  frequent rest breaks, able to tolerate 7-10 minutes in standing, unsteady  Patient Education:  PT role, PT POC, d/c recs, DME recs    Safety & Handoff:    Repositioned: Sitting at EOB and Nurse/NCP aware of patient position  Safety: call light within reach, phone within reach, bedside table accessible, bed alarm on, fall mat, side rails x3 up, family at bedside, and bed in low position    Rehab Potential: good    Assessment:  Pt seen for follow up PT visit, received seated in chair and agreeable to treatment. Multiple reps of transfers performed from various surfaces with CGA and cues for hand placement. Pt ambulating 159ft (19ft x 5) in room with no AD and then 368ft on unit. Pt refusing to use RW despite education for safety, instead  reaching for handrail for support. Pt requires several standing rest breaks due to fatigue. Consider home with support and HHPT.     Therapy Considerations on Discharge  Therapy on Discharge: Home health therapy    Home Equipment Recommended: None (Pt owns DME)    Plan:    PT Therapy Plan: Continue physical therapy  PT Frequency: 3-5 days/week  Plan of care will include  assistive device recommendations, balance activities, functional activities, gait training, modalities as needed, neuromuscular reeducation, patient/family education and training, ROM, stair training, transfer training, and therapeutic exercise    Hunter Higgins  Dept # [phone]  Available via [employer]  Electronically signed by Remi Haggard, PTA at 08/27/2022  4:47 PM EST

## 2022-08-27 NOTE — Case Communication (Signed)
Formatting of this note is different from the original.     08/27/22 1417   Medication Assistance   Medication provided amoxicillin-clavulanate  Your estimated payment per fill: $4   aspirin  Your estimated payment per fill: Estimate unavailable   atorvastatin  Your estimated payment per fill: $0   b complex-vitamin c-folic acid  Your estimated payment per fill: Estimate unavailable   dexAMETHasone  Your estimated payment per fill: $4   guaiFENesin  Your estimated payment per fill: Estimate unavailable     Informed patient has lost wallet, asked for cost share for meds. Will fill meds in house today. ICM to cost share.    Amy Tressia Miners  BSN, RN  Inpatient Case Management  Resource Pool      Electronically signed by Ezzard Flax, RN at 08/27/2022  2:18 PM EST

## 2022-08-27 NOTE — Progress Notes (Signed)
Formatting of this note is different from the original.  Seen separately. Patient with no new resp events. No stridor. Says he continues to have trouble taking deep breaths and dyspnea with exertion. He feels significantly better than when he arrived at the hospital. Continue decadron, Augmentin. We will arrange outpatient follow up when he is discharged.     Domingo Dimes, MD PGY5  Otolaryngology- Head and Neck Surgery    [facility] Otolaryngology - Head and Neck Surgery  Progress Note  Admit Date: 08/23/2022    HD: 5  Antibiotics: Unasyn    VTE Prophylaxis: SQH   Diet: Diabetic   Lines/Drains: -  Fluids/Gtt: -     SUBJECTIVE:  Interval: no events overnight. Continues on RA. Got 10 mg IV Decadron around 10 PM last night.     Doing okay. Feels good right now. Says breathing is occasionally tight.     OBJECTIVE:  Temp (24hrs), Avg:98.1 F (36.7 C), Min:97.2 F (36.2 C), Max:98.5 F (36.9 C)    Temp: 98.3 F (36.8 C)  Pulse: 77  BP: 151/67  Resp: 18  SpO2: 96 % on RA    Physical Exam:  General: NAD, resting comfortably in bed  HEENT: No trismus, edentulous except single mandibular tooth, tongue soft/mobile, OP symmetric. No stridor, Strong voice  Cardiovascular: RRR, 2+ radial pulses  Pulmonary: Chest rise symmetric, normal WOB  Abdominal: Soft, non-distended, non-tender  Extremities: Warm and well perfused.     Labs:  CBC w/Diff   Recent Labs     08/27/22  0506 08/26/22  1729 08/26/22  0340 08/25/22  0437   WBC 5.8 6.4 8.3 11.4*   HEMOGLOBIN 9.7* 9.5* 10.0* 9.6*   HCT 31.0* 30.0* 32.1* 30.7*   PLATELET 176 175 182 182   MCV 72* 72* 73* 73*   SEGS  --   --  87* 86*   LYMPHOCYTES  --   --  6* 6*   MONOS  --   --  7 8   EOS  --   --  0 0   BASOS  --   --  0 0   RDW 16.6* 16.9* 17.0* 16.6*         Basic Metabolic Profile   Recent Labs     08/26/22  0340 08/25/22  0437 08/24/22  0626   NA 136 136 135   POTASSIUM 5.3 6.0* 7.6*   CHLORIDE 96* 96* 98   CO2 25 24 22    BUN 49* 49* 72*   CREAT 7.7* 9.6* 13.0*   GLUCOSE 218* 191*  208*   CALCIUM 8.5 8.9 9.2   MAGNESIUM 2.1  --   --    PHOSPHORUS 5.7*  --   --          Glucose   Recent Labs     08/26/22  2026 08/26/22  1705 08/26/22  1132 08/26/22  0827 08/26/22  0340 08/25/22  2115 08/25/22  0807 08/25/22  0437 08/24/22  1410 08/24/22  0626   GLUCOSE  --   --   --   --  218*  --   --  191*  --  208*   GLUPOC 120* 258* 133* 237*  --  270*   < >  --    < >  --     < > = values in this interval not displayed.         Imaging(24hr):  n/a    ASSESSMENT:  Hunter Higgins is  a 72 y.o. male w/ complex PMH including CAD and ESRD on HD presenting with worsening SOB and inspiratory stridor with FFL notable for poor VF abduction and infraglottic edema.    Respiratory panel is RSV+. This is likely etiology of infraglottic edema present on admission. 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    PLAN:    Unasyn --> Augmentin, total course 5 days is fine with [place]  Continue decadron tape as ordered  Prn rac epi  Okay to continue work towards d/c as steroid taper allows  Airway plan:  Seems less likely at this point, but if urgent distress requiring intubation, can be transorally intubated by Anesthesia with 6.0 ETT. If there is worsened symptoms, would prefer however to take patient Code 1 to OR for airway management  We will arrange outpatient f/u at discharge  ENT attending: Dr Jen Mow, MD  [place]  [phone] (M-F 7-5; after hours please page on-call resident)  08/27/2022, 6:06 AM    Electronically signed by Elaina Pattee, MD,RES at 08/27/2022  3:29 PM EST

## 2022-08-28 NOTE — Discharge Summary (Signed)
Formatting of this note might be different from the original.  Images from the original note were not included.  Condition remained stable on oral steroid and oral antibiotic therapy.  Patient will transition home with home health care services today.  Plan of care coordinated with EP service, case management, bedside nurse.  For details of this hospitalization please refer to my note from 08/27/2022.    Leretha Dykes, MD          [facility]      Electronically signed by Leretha Dykes, MD at 08/28/2022 11:32 AM EST

## 2022-08-28 NOTE — Care Plan (Signed)
Formatting of this note might be different from the original.    Problem: Adult Inpatient Plan of Care  Goal: Plan of Care Review  Outcome: Progressing  Flowsheets  Taken 08/28/2022 0217 by Vista Mink, RN  Plan of Care Reviewed With: patient  Taken 08/27/2022 1343 by Arlice Colt, RN  Progress: improving  Outcome Evaluation: Patient will maintain a patent airway as evidenced by clear breath sounds, oxygen saturation within normal limits, and the ability to cough to clear secretions.  Goal: Patient-Specific Goal (Individualized)  Outcome: Progressing  Goal: Absence of Hospital-Acquired Illness or Injury  Outcome: Progressing  Goal: Optimal Comfort and Wellbeing  Outcome: Progressing  Goal: Readiness for Transition of Care  Outcome: Progressing    Problem: Infection  Goal: Absence of Infection Signs and Symptoms  Outcome: Progressing    Problem: Hemodialysis  Goal: Safe, Effective Therapy Delivery  Outcome: Progressing  Goal: Effective Tissue Perfusion  Outcome: Progressing    Electronically signed by Vista Mink, RN at 08/28/2022  2:18 AM EST

## 2022-08-28 NOTE — Care Plan (Signed)
Formatting of this note might be different from the original.    Problem: Adult Inpatient Plan of Care  Goal: Plan of Care Review  Outcome: Met  Flowsheets  Taken 08/28/2022 1711  Progress: improving  Plan of Care Reviewed With: patient  Taken 08/27/2022 1343  Outcome Evaluation: Patient will maintain a patent airway as evidenced by clear breath sounds, oxygen saturation within normal limits, and the ability to cough to clear secretions.  Goal: Patient-Specific Goal (Individualized)  Outcome: Met  Flowsheets (Taken 08/27/2022 1343)  Patient/Family-Specific Goals (Include Timeframe): Patient will maintain a patent airway as evidenced by clear breath sounds, oxygen saturation within normal limits, and the ability to cough to clear secretions.  Goal: Absence of Hospital-Acquired Illness or Injury  Outcome: Met  Goal: Optimal Comfort and Wellbeing  Outcome: Met  Goal: Readiness for Transition of Care  Outcome: Met    Problem: Infection  Goal: Absence of Infection Signs and Symptoms  Outcome: Met    Problem: Hemodialysis  Goal: Safe, Effective Therapy Delivery  Outcome: Met  Goal: Effective Tissue Perfusion  Outcome: Met  Goal: Absence of Infection Signs and Symptoms  Outcome: Met    Problem: Fall Prevention  Goal: Prevent/Manage Accidental Injury (Falls)  Description: Consider requesting a pharmacologic review as needed.  Consider asking the MD to consult PT / OT for an evaluation.  Outcome: Met    Problem: Skin Injury Risk Increased  Goal: Skin Health and Integrity  Outcome: Met    Problem: Gas Exchange Impaired  Goal: Optimal Gas Exchange  Outcome: Met    Electronically signed by Arlice Colt, RN at 08/28/2022  5:12 PM EST

## 2022-08-28 NOTE — Progress Notes (Signed)
Formatting of this note is different from the original.  Images from the original note were not included.    [facility]     Electrophysiology  Progress Note    Patient Hunter Higgins, 72 y.o. male [birth year]   MRN [id number]   [facility]     EP Attending: Dr. Neila Gear  Chief complaint: wenckebach     Assessment & Plan:  Tele: SR, intermittent wenckebach, nocturnal and daytime junctional  After further discussion with patient, so lightheadedness yesterday evening and this morning that do no correlate with arrhythmia.   -Avoid AVN agents  -Recommend 30 day HM (message sent to have RN coordinate as an outpatient)  -Follow up with general cardiology for long term CAD management (message sent)    Patient is stable for discharge from an EP standpoint.    Final recommendations per attending Electrophysiologist.    Problem List:    Intermittent 2nd degree type 1 AV block narrow QRS  Baseline 1st degree AV block  CAD              S/p cath showing severe 2 vessel CAD involving RCA and circumflex artery with moderate LAD and significant diagonal disease. Recommended for medical therapy   Chronic diastolic heart failure               Echo 12/24/2020 EF 51% moderate pulmonary HTN 43mmHg   ESRD on HD  Hypertension  PVD  Pulmonary HTN    Interval History:  VSS  H&H 9.7&31  K 5.3    Subjective:  Pt not feeling great today, reports intermittent lightheadedness overall generalized weakness    Review of Systems:     Constitutional: negative for malaise/fatigue   Cardiovascular: negative for chest pain, leg swelling, orthopnea, palpitations   Respiratory: negative for shortness of breath   Gastointestinal: negative for nausea, or vomiting   Neurological: negative for dizziness     Current Medications:  amoxicillin-clavulanate, 1 Tab, Daily  aspirin, 81 mg, Daily  atorvastatin, 20 mg, QHS  dexAMETHasone, 4 mg, Q12H  heparin, 5,000 Units, Q8H  insulin LISPRO, 2-10 Units, QAC &  QHS  polyethylene glycol (Miralax) orderable, 17 g, Daily        Allergies:  Allergies   Allergen Reactions    Naproxen gi distress     Physical Exam:  BP 151/71   Pulse 67   Temp 98.3 F (36.8 C)   Resp 17   Ht 5\' 7"  (1.702 m)   Wt 73 kg (160 lb 15 oz)   SpO2 98%   BMI 25.21 kg/m  BMI:25.21    No intake or output data in the 24 hours ending 08/28/22 1015    General: Alert and oriented x 3, NAD  CVS:  regular rate and rhythm, normal S1S2, no murmur, no rubs or gallops  Pulm: normal effort, clear to auscultation bilaterally  Abd:  Non distended  Ext:  Warm well perfused without edema    Telemetry reviewed:  SR      Labs:    CBC w/Diff  Lab Results   Component Value Date/Time    WBC 5.8 08/27/2022 05:06 AM    RBC 4.29 08/27/2022 05:06 AM    HEMOGLOBIN 9.7 (L) 08/27/2022 05:06 AM    HCT 31.0 (L) 08/27/2022 05:06 AM    MCV 72 (L) 08/27/2022 05:06 AM    MCH 23 (L) 08/27/2022 05:06 AM    MCHC 31 08/27/2022 05:06 AM    RDW  16.6 (H) 08/27/2022 05:06 AM    PLATELET 176 08/27/2022 05:06 AM    MPV 11.2 08/27/2022 05:06 AM    SEGS 87 (H) 08/26/2022 03:40 AM    LYMPHOCYTES 6 (L) 08/26/2022 03:40 AM    MONOS 7 08/26/2022 03:40 AM    EOS 0 08/26/2022 03:40 AM    BASOS 0 08/26/2022 03:40 AM      Basic Metabolic Profile  Lab Results   Component Value Date    NA 135 08/27/2022    POTASSIUM 5.3 08/27/2022    CHLORIDE 97 (L) 08/27/2022    CO2 25 08/27/2022    BUN 33 (H) 08/27/2022    CREAT 6.1 (H) 08/27/2022    GLUCOSE 358 (H) 08/27/2022    CALCIUM 8.5 08/27/2022    MAGNESIUM 2.1 08/26/2022    PHOSPHORUS 5.7 (H) 08/26/2022     Lab Results   Component Value Date    PT 11.3 08/23/2022    INR 1.02 08/23/2022     Diagnostic Studies:    Results for orders placed or performed during the hospital encounter of 08/23/22   EKG 12 LEAD UNIT PERFORMED   Result Value Ref Range Status    Heart Rate 60 bpm Final    RR Interval 836 ms Final    Atrial Rate 75 ms Final    P-R Interval 96 ms Final    P Duration 44 ms Final    P Horizontal Axis 3  deg Final    P Front Axis 0 deg Final    Q Onset 506 ms Final    QRSD Interval 99 ms Final    QT Interval 427 ms Final    QTcB 467 ms Final    QTcF 427 ms Final    QRS Horizontal Axis -40 deg Final    QRS Axis 26 deg Final    I-40 Front Axis 35 deg Final    t-40 Horizontal Axis -54 deg Final    T-40 Front Axis 20 deg Final    T Horizontal Axis 82 deg Final    T Wave Axis -3 deg Final    S-T Horizontal Axis 93 deg Final    S-T Front Axis -81 deg Final    Impression - ABNORMAL ECG -  Final    Impression SR-Sinus rhythm-normal P axis, V-rate 50-99  Final    Impression SARSV-Sinus pause-long R-R interval, normal QRSd  Final    Impression SPR-Short PR interval-PR <169mS  Final    Impression   Final     LVH-Left ventricular hypertrophy-multiple voltage criteria    Impression   Final     CINJA-ST elevation, consider anterior injury-ST >0.23mV, V1-V5   EKG 12-Lead   Result Value Ref Range Status    Heart Rate 68 bpm Final    RR Interval 880 ms Final    Atrial Rate 70 ms Final    P-R Interval 397 ms Final    P Duration 157 ms Final    P Horizontal Axis -78 deg Final    P Front Axis 0 deg Final    Q Onset 503 ms Final    QRSD Interval 93 ms Final    QT Interval 421 ms Final    QTcB 449 ms Final    QTcF 439 ms Final    QRS Horizontal Axis -33 deg Final    QRS Axis 24 deg Final    I-40 Front Axis 35 deg Final    t-40 Horizontal Axis -30 deg Final  T-40 Front Axis 20 deg Final    T Horizontal Axis 115 deg Final    T Wave Axis 268 deg Final    S-T Horizontal Axis 119 deg Final    S-T Front Axis 256 deg Final    Impression - ABNORMAL ECG -  Final    Impression   Final     2AVB-Second degree AV block, Mobitz II-multiple P waves    Impression   Final     LVHPRE-Probable LVH with secondary repol abnrm-multiple LVH criteria    Impression   Final     STELVH-Anterior ST elevation, probably due to LVH-ST >0.20 mV in V1-V4 & LVH   EKG 12-LEAD   Result Value Ref Range Status    Heart Rate 68 bpm Final    RR Interval 888 ms Final     Atrial Rate 73 ms Final    P-R Interval 337 ms Final    P Duration 159 ms Final    P Horizontal Axis 80 deg Final    P Front Axis 50 deg Final    Q Onset 506 ms Final    QRSD Interval 101 ms Final    QT Interval 425 ms Final    QTcB 451 ms Final    QTcF 443 ms Final    QRS Horizontal Axis -35 deg Final    QRS Axis 25 deg Final    I-40 Front Axis 32 deg Final    t-40 Horizontal Axis -38 deg Final    T-40 Front Axis 21 deg Final    T Horizontal Axis 105 deg Final    T Wave Axis -87 deg Final    S-T Horizontal Axis 114 deg Final    S-T Front Axis 252 deg Final    Impression - ABNORMAL ECG -  Final    Impression SA-Sinus arrhythmia-V-rate 63-72, variation>10%  Final    Impression SARSV-Sinus pause-long R-R interval, normal QRSd  Final    Impression 1AVB-Prolonged PR interval-PR >220, V-rate 50-90  Final    Impression   Final     REPB-Borderline repolarization abnormality-ST dep & abnormal T   EKG 12-LEAD   Result Value Ref Range Status    Heart Rate 87 bpm Final    RR Interval 660 ms Final    Atrial Rate 84 ms Final    P-R Interval 280 ms Final    P Duration 212 ms Final    P Horizontal Axis 202 deg Final    P Front Axis 252 deg Final    Q Onset 503 ms Final    QRSD Interval 93 ms Final    QT Interval 420 ms Final    QTcB 517 ms Final    QTcF 475 ms Final    QRS Horizontal Axis -25 deg Final    QRS Axis 45 deg Final    I-40 Front Axis 55 deg Final    t-40 Horizontal Axis -35 deg Final    T-40 Front Axis 35 deg Final    T Horizontal Axis 79 deg Final    T Wave Axis 8 deg Final    S-T Horizontal Axis 136 deg Final    S-T Front Axis 262 deg Final    Impression - ABNORMAL ECG -  Final    Impression   Final     SEAR-Sinus or ectopic atrial rhythm-P axis (-45,135)    Impression SARSV-Sinus pause-long R-R interval, normal QRSd  Final    Impression 1AVB-Prolonged PR interval-PR >220, V-rate  50-90  Final    Impression   Final     LVHSR-Consider left ventricular hypertrophy-(S V1+R V5/V6) >3.94mV    Impression   Final      STELVH-Anterior ST elevation, probably due to LVH-ST >0.20 mV in V1-V4 & LVH    Impression LQTB-Borderline prolonged QT interval-QTc >464mS  Final    Impression -similar to previous-  Final     Results for orders placed or performed during the hospital encounter of 12/23/20   Echo Cardiogram Complete   Result Value Ref Range    EF Echo 51     Narrative    CONCLUSIONS    * Left ventricular chamber size is normal.    * Moderate pulmonary hypertension, estimated pulmonary arterial systolic  pressure is 65 mmHg.    * Left ventricular systolic function is mildly reduced with an ejection  fraction of 51 % by Simpson's biplane.    * There is mild posterior wall hypertrophy.    * Left ventricular diastolic function: Grade III diastolic dysfunction.    * Right ventricular chamber size is normal with mildly reduced free wall  systolic function. FAC is 29%.    * There is mild, eccentric aortic valve regurgitation.    * Definity used for delineation of endocardial borders.    Comparison    * Compared to prior study from outside facility dated 01/01/20: Previous EF  of 60-65%, no WMA, grade II diastolic dysfunction, RA pressure of 3 mmHg, PAP  not estimated.    Patient Info  Name:     JEREMIAH CURCI  Age:     44 years  DOB:     [birth year]  Gender:     Male  MRN:     [id number]  Ht:     67 in  Wt:     168 lb  BSA:     1.90 m2  BP:     191  /     88 mmHg  Exam Date:     12/24/2020 8:33 AM  Patient Status:     Inpatient    Exam Type:     ECHO CARDIOGRAM COMPLETE    Indications       - shortness of breath    Left Ventricle    Left ventricular chamber size is normal.    Left ventricular systolic function is mildly reduced with an ejection  fraction of 51 % by Simpson's biplane.    The anterolateral, mid to apical inferolateral, and basal to mid anterior  walls are hypokinetic/akinetic.    All other walls appear normal.    There is mild posterior wall hypertrophy.    Left ventricular diastolic function: Grade III diastolic  dysfunction.    Right Ventricle    Right ventricular chamber size is normal with mildly reduced free wall  systolic function. FAC is 29%.    TAPSE measures 1.4 cm.    Left Atrium    Left atrial chamber is mildly enlarged with a left atrial volume index of  36.00 ml/m^2 by BP MOD.    Right Atrium    Right atrial chamber size is normal.    Aortic Valve    The aortic valve is tricuspid with minimal sclerosis.    There is no aortic valve stenosis.    There is mild, eccentric aortic valve regurgitation.    Pulmonic Valve    The pulmonic valve is normal.    There is no pulmonic valve stenosis.    There  is mild pulmonic regurgitation.    Mitral Valve    The mitral valve has mildly thickened leaflets.    There is no mitral valve stenosis.    There is trace mitral valve regurgitation.    Tricuspid Valve    The tricuspid valve leaflets are normal.    There is no tricuspid valve stenosis.    There is trace tricuspid valve regurgitation.    Moderate pulmonary hypertension, estimated pulmonary arterial systolic  pressure is 65 mmHg.    Pericardium/Pleural    There is no pericardial effusion.    Inferior Vena Cava    Dilated inferior vena cava with >50% collapse upon inspiration consistent  with significantly elevated right atrial pressure, 15 mmHg.    Aorta    The prox ascending aorta is upper normal measuring 3.5 cm with an index of  1.86 cm/m2.    The aortic root at the sinus of Valsalva is normal measuring 3.1 cm with an  index of 1.63 cm/m2.    The aortic measurements are indexed to age and body surface area.    Mitral Valve  ----------------------------------------------------------------------  Name                                 Value        Normal  ----------------------------------------------------------------------    MV Doppler  ----------------------------------------------------------------------  MV Decel Slope                   714 cm/s2                 MV PHT                               39 ms                  MV Area (PHT)                     5.64 cm2     1.61-0.96     MV Diastolic Function  ----------------------------------------------------------------------  MV E Peak Velocity              95.50 cm/s                 MV A Peak Velocity              39.40 cm/s                 MV E/A                                2.42        <=1.60   MV Decel Time PW                    134 ms                   MV Annular TDI  ----------------------------------------------------------------------  MV Septal e' Velocity            5.87 cm/s        >=7.00   MV Lateral e' Velocity           5.11 cm/s       >=10.00   MV e' Average  5.49                 MV E/e' (Average)                    17.48       <=14.00    Tricuspid Valve  ----------------------------------------------------------------------  Name                                 Value        Normal  ----------------------------------------------------------------------    TV Regurgitation Doppler  ----------------------------------------------------------------------  TR Peak Velocity               352.00 cm/s      <=280.00   TR Peak Gradient                   42 mmHg                   Estimated PAP/RSVP  ----------------------------------------------------------------------  RA Pressure                        15 mmHg            <8   PA Systolic Pressure               65 mmHg           <10   RV Systolic Pressure               65 mmHg           <36    Aorta  ----------------------------------------------------------------------  Name                                 Value        Normal  ----------------------------------------------------------------------    Ascending Aorta  ----------------------------------------------------------------------  Sinus of Valsalva Diameter          3.1 cm         <=3.7   Sinus of Valsalva Index         1.63 cm/m2        <=1.90   Prox Asc Ao Diameter                3.5 cm         <=3.4   Prox Asc Ao Diameter Index      1.86 cm/m2         <=1.70    Venous  ----------------------------------------------------------------------  Name                                 Value        Normal  ----------------------------------------------------------------------    IVC/SVC  ----------------------------------------------------------------------  IVC Diameter (Exp 2D)              2.15 cm        <=2.10   IVC Diameter Percent Change  (2D)                                  11 %          >=50    Aortic Valve  ----------------------------------------------------------------------  Name  Value        Normal  ----------------------------------------------------------------------    AV Regurgitation Doppler  ----------------------------------------------------------------------  AR Decel Slope                 167.00 cm/s2                 AR PHT                              711 ms    Ventricles  ----------------------------------------------------------------------  Name                                 Value        Normal  ----------------------------------------------------------------------    LV Dimensions 2D/MM  ----------------------------------------------------------------------  IVS Diastolic Thickness (2D)       1.02 cm        <=1.04   LVID Diastole (2D)                 4.54 cm     7.16-9.67   LVPW Diastolic Thickness  (2D)                               1.25 cm        <=1.04   LVID Systole (2D)                  3.17 cm     8.93-8.10   LVID Diastolic Index (2D)       2.39 cm/m2     2.20-3.00   LV Mass (2D Cubed)                185.51 g  88.00-224.00   LV Mass Index (2D Cubed)        0.01 g/cm2     0.00-0.01   Relative Wall Thickness (2D)          0.55                   LV Fractional Shortening/Ejection Fraction 2D/MM  ----------------------------------------------------------------------  LV EF (BP MOD)                        51 %         17-51   LV Diastolic Volume (BP MOD)     131.77 ml  02.58-527.78   LV Diastolic Volume  Index  (BP MOD)                       69.34 ml/m2   24.23-53.61   LV Systolic Volume (BP MOD)       64.68 ml   44.31-54.00   LV Systolic Volume Index (BP  MOD)                           34.04 ml/m2   86.76-19.50   LV Diastolic Length (4C)           8.99 cm                 LV Systolic Length (4C)            7.76 cm                 LV Stroke Volume (4C  MOD)         64.16 ml                   RV Dimensions 2D/MM  ----------------------------------------------------------------------  RV Basal Diastolic Dimension       9.56 cm     2.13-0.86   RV Diastolic Area (4C)           18.00 cm2   57.84-69.62   RV Systolic Area (4C)            12.70 cm2    3.00-15.00   TAPSE                               1.4 cm         >=1.7     RV Fractional Shortening 2D  ----------------------------------------------------------------------  RV FAC (4C)                           29 %          >=35    Atria  ----------------------------------------------------------------------  Name                                 Value        Normal  ----------------------------------------------------------------------    LA Dimensions  ----------------------------------------------------------------------  LA Dimension (2D)                  3.50 cm     3.00-4.10   LA Dimen Index (2D)             1.84 cm/m2                 LA Volume Index (BP MOD)       36.00 ml/m2   16.00-34.00     RA Dimensions  ----------------------------------------------------------------------  RA Diastolic Major Axis  Length (4C)                        4.75 cm                 RA Diastolic Major Axis  Length Index (4C)                     0.25                 RA Area (4C)                     16.90 cm2        <21.00   RA ESV (4C MOD)                   49.20 ml   18.00-32.00   RA ESV Index (4C MOD)          25.89 ml/m2       <=32.00    Report Signatures  Finalized by Percell Miller  MD on 12/24/2020 10:50 AM    Technical Quality:     Adequate image quality    Staff  Ordering Provider:      Maisie Fus  Referring Provider:     UDSEN, IAN  ;    Complete transthoracic echocardiogram performed with 2D imaging, color  Doppler, and spectral Doppler.    Ultrasound enhancing agent is used.  [id number]    Sonographer:     Carollee Leitz RCS    Image Enhancing Agent/Agitated Saline    ------------------------------  Imaging Agent/Ag. Saline:     Definity  Amount:     --- ml     Personal Protective Equipment was used including;  mask-surgical, hands-gloves, and body-gown.  Patient was not masked.    Maxwell Marion, PA-C  [facility]- Electrophysiology  Pager: [phone] (M-F 7:00-4:00)  If no response, please contact attending or on call physician [phone])      Electronically signed by Hayden Rasmussen, DO at 08/28/2022  8:30 PM EST    Associated attestation - Grammes, Jon A, DO - 08/28/2022  8:30 PM EST  Formatting of this note is different from the original.  Images from the original note were not included.    [facility]    Cardiology EP Attending Addendum:    I have seen and examined the patient independently of Ms. Darrel Reach.  I agree with the history and physical examination.  jointly.  I have independently seen, interviewed, and examined the patient and I have reviewed the relevant available data.   I contributed >51% of the care documented in this note which was formulated.    I have independently performed the substantive portion of the physical exam and the substantive portion of the assessment of risk and formulation of the assessment and plan    My independent encounter is notable for:    Assessment:  Intermittent 2nd degree type 1 AV block narrow QRS  Baseline 1st degree AV block  CAD              S/p cath showing severe 2 vessel CAD involving RCA and circumflex artery with moderate LAD and significant diagonal disease. Recommended for medical therapy   Chronic diastolic heart failure               Echo 12/24/2020 EF 51% moderate pulmonary HTN 76mmHg   ESRD on  HD  Hypertension    Plan:  Tele - Occasional Mobtiz I AVB that doesn't correlate with symptoms.    Avoid AVN blocking agents  Recommend 30d event monitor. Can arrange as outpatient  OK fro DC from EP standpoint    Subjective:    Denies CP or SOB    Objective:    Visit Vitals  BP 156/72   Pulse 71   Temp 98.4 F (36.9 C)   Resp 18   Ht 5\' 7"  (1.702 m)   Wt 73 kg (160 lb 15 oz)   SpO2 98%   BMI 25.21 kg/m     Tele - SR.  Lungs CTA.  CV regular.  No edema.  AAO x 3    Hayden Rasmussen, DO, [facility]  [facility]  [address] [place]  [address]    Due to the inconsistency of information transmitted through the [facility] mobile paging platform, as well as the complete lack of information available in many of those pages, I will no longer receive pages through [facility] mobile.      Alternatively, I am always available through [employer], so please use that as the primary communication platform.  Further, this is the platform that [employer] is supporting systems wide.  You can also call our SCS answering service directly, or as a last resort the Product manager.      08/28/2022, 8:28 PM

## 2022-08-28 NOTE — Case Communication (Signed)
Formatting of this note is different from the original.     08/28/22 1139   Case Management Discharge Sign-Off    Case Management Discharge Sign-Off Transition Plan Complete - Ready for Discharge     Patient is ready to discharge and patients daughter will provide transport. ICM called [facility] and spoke with  Barnett Applebaum    to inform of todays discharge. No other needs from CM.    Lajuana Carry, MSN, RN, [facility]  [facility] Case Management Department  CM Office [phone]  Ascom/voicemail [phone]    -IF need any ICM assist after 1630pm or weekend contact ICM pager # [phone]. Thank you      Personal Protective Equipment used during this patient encounter  Electronically signed by Antony Odea, RN at 08/28/2022 11:46 AM EST

## 2022-08-30 ENCOUNTER — Encounter: Payer: MEDICARE | Primary: Internal Medicine

## 2022-08-31 NOTE — Telephone Encounter (Signed)
Formatting of this note might be different from the original.  Images from the original note were not included.  Message  Received: Today  Kandis Ban, MD  Solon Palm, RN; Merrily Brittle, [place]; Marykay Lex, [place]; Orland Dec  Please coordinate cardiology fu as well    Phil  Electronically signed by Solon Palm, RN at 08/31/2022  2:07 PM EST

## 2022-08-31 NOTE — Telephone Encounter (Signed)
Formatting of this note is different from the original.  Images from the original note were not included.  Left detailed VM for patient regarding below:   Appnt made and letter with appnt details mailed to pts home address on file.   Requested return call to discuss.    =======================================================================    Merrily Brittle, [place]  Solon Palm, RN; Neila Gear, Wonda Cheng, MD; Marykay Lex, [place]  Live and bradycardia diagnosis       Previous Messages      ----- Message -----  From: Solon Palm, RN  Sent: 08/31/2022   8:15 AM EST  To: Merrily Brittle, PA; Maxwell Marion V, PA; *    Live or holter monitor? And Diagnosis?    Thanks,  [employer], RN    ----- Message -----  From: Marykay Lex, [place]  Sent: 08/28/2022  10:22 AM EST  To: Solon Palm, RN    Hey can we please send him a 30 day HM for PJG to read please. Thank youuuu    Kindly  Fb    Electronically signed by Solon Palm, RN at 08/31/2022  9:52 AM EST

## 2022-09-01 ENCOUNTER — Encounter: Payer: MEDICARE | Primary: Internal Medicine

## 2022-09-06 ENCOUNTER — Encounter: Payer: MEDICARE | Primary: Internal Medicine

## 2022-09-07 NOTE — Telephone Encounter (Signed)
Formatting of this note might be different from the original.  09/07/22 attempted to contact pt to confirm appt for 09/12/22 @ 11a lmovm  Electronically signed by Waynetta Pean at 09/07/2022  1:28 PM EST

## 2022-09-08 ENCOUNTER — Inpatient Hospital Stay: Admit: 2022-09-08 | Payer: MEDICARE | Primary: Internal Medicine

## 2022-09-08 NOTE — Progress Notes (Signed)
PHYSICAL / OCCUPATIONAL THERAPY - DAILY TREATMENT NOTE (updated 1/23)    Patient Name: Hunter Higgins    Date: 09/08/2022    DOB: 07/22/1951  Insurance: Payor: [employer] MEDICARE / Plan: [employer]HC AARP MEDICARE ADVANTAGE / Product Type: *No Product type* /      Patient DOB verified yes     Visit #   Current / Total 4 10 Total Time   Time   In / Out 9:47 am 10:42 am 55   Pain   In / Out 7/10 7/10    Subjective Functional Status/Changes: Patient notes he was hospitalized as he was diagnosed with an upper respiratory disease. He states he is out of shape due to still recovering from illness. Patient denies falls.     TREATMENT AREA =  Other abnormalities of gait and mobility [R26.89]  Other low back pain [M54.59]    OBJECTIVE  Modalities Rationale:     decrease pain and increase tissue extensibility to improve patient's ability to progress to PLOF and address remaining functional goals.     min []  Estim Unattended, type/location:                                      []   w/ice    []   w/heat    min []  Estim Attended, type/location:                                     []   w/US     []   w/ice    []   w/heat    []   TENS insruct      min []   Mechanical Traction: type/lbs                   []   pro   []   sup   []   int   []   cont    []   before manual    []   after manual    min []   Ultrasound, settings/location:     10 min  unbill []   Ice     [x]   Heat    location/position: L/S; patient semi-reclined with legs elevated on wedge    min []   Paraffin,  details:     min []   Vasopneumatic Device, press/temp:     min []   Whirlpool / Fluido:    If using vaso (only need to measure limb vaso being performed on)      pre-treatment girth :       post-treatment girth :       measured at (landmark location) :      min []   Other:    Skin assessment post-treatment:   Intact       Therapeutic Procedures:  45  Total 39  Total MC BC Totals Reminder: bill using total billable min of TIMED therapeutic procedures (example: do not include dry needle or estim  unattended, both untimed codes, in totals to left)  8-22 min = 1 unit; 23-37 min = 2 units; 38-52 min = 3 units; 53-67 min = 4 units; 68-82 min = 5 units   Tx Min Billable or 1:1 Min (if diff from Tx Min) Procedure, Rationale, Specifics   14 8 97710 Therapeutic Exercise (timed):  increase ROM, strength, coordination, balance, and proprioception to improve patient's ability to progress to  PLOF and address remaining functional goals. (see flow sheet as applicable)     Details if applicable: stretching, strengthening      15 15 97530 Therapeutic Activity (timed):  use of dynamic activities replicating functional movements to increase ROM, strength, coordination, balance, and proprioception in order to improve patient's ability to progress to PLOF and address remaining functional goals.  (see flow sheet as applicable)     Details if applicable:  squatting, step ups     16 16 97112 Neuromuscular Re-Education (timed):  improve balance, coordination, kinesthetic sense, posture, core stability and proprioception to improve patient's ability to develop conscious control of individual muscles and awareness of position of extremities in order to progress to PLOF and address remaining functional goals. (see flow sheet as applicable)     Balance (dynamic/static)  mCTSIB         [x]   Patient Education billed concurrently with other procedures   [x]  Review HEP    []  Progressed/Changed HEP, detail:    []  Other detail:       Objective Information/Functional Measures/Assessment  mCTSIB: 3/4; foam EC ROM 9 seconds    Patient with decreased exercise tolerance today due to recovering from illness, req seated rest breaks for recovery after most therapeutic interventions. Able to achieve step up (I) onto four inch step, albeit with increased physiological tremor of the (R)>(L) LE upon retrograde descent to starting position.    Patient will continue to benefit from skilled PT / OT services to modify and progress therapeutic interventions,  analyze and address functional mobility deficits, analyze and address ROM deficits, analyze and address strength deficits, analyze and address soft tissue restrictions, analyze and cue for proper movement patterns, analyze and modify for postural abnormalities, analyze and address imbalance/dizziness, and instruct in home and community integration to address functional deficits and attain remaining goals.    Progress toward goals / Updated goals:  []   See Progress Note/Recertification    Short Term Goals: To be accomplished in 2-4 weeks   Patient to be independent with safety strategies when walking and with transfers (I.e. utilization of AD for gait, keeping phone with him)   Status at IE reviewed  Current: goal not met; will progress next session (08/16/22)  2.  Patient will be able to perform sit to stand on first attempt with BUE assist and proper eccentric control with no LOB upon standing.  Status at IE: unsteadiness upon standing, form deficits  3. Patient to improve static balance as shown by ability to maintain static stance for at least 45 seconds with minimal sway and LRAD.  Status at IE: CGA due to unsteadiness   Current: goal progressing; pt demos 3/4 mCTSIB, failing only foam ROM EC 9 seconds (09/08/22)     Long Term Goals: To be accomplished in 8-12 weeks from initial evaluation   Patient will be independent with progressive HEP to facilitate/maintain gains made in PT upon discharge   Status at IE: will establish initial HEP at first follow up session   Current: progressing, established hip strengthening HEP (08/18/22)  2.  Patient will be able to ambulate at least 10 minutes with LRAD and no LOB to indicate improved mobility  Status at IE: unable   3.  Patient will be able to maintain tandem balance bilaterally, with eyes open, on firm surface for at least 15 seconds to indicate improved stability in narrow BOS   Status at IE: unable   4.  Patient will restore trunk flexion range  of motion as shown by  fingertips to mid-shin with no more than 3/10 pain rating and without AD for functional carryover.  Status at IE: unable     Next PN/ RC due 09/09/22  Auth due NAR    PLAN  [x]  Continue plan of care  [x]   Upgrade activities as tolerated  []   Discharge due to :  [x]   Other: reassessment NV    Roanna Raider, PTA    09/08/2022        Future Appointments   Date Time Provider [place]   09/13/2022  9:40 AM Roanna Raider, PTA [facility]   09/15/2022  9:40 AM Roanna Raider, PTA [facility]   09/22/2022 10:00 AM Charm Barges, MD [facility]   09/27/2022 10:30 AM Louann Sjogren, MD [facility] AM[facility]   10/18/2022  9:30 AM Caines, Venia Carbon, MD [facility] AM[facility]

## 2022-09-13 ENCOUNTER — Inpatient Hospital Stay: Payer: MEDICARE | Primary: Internal Medicine

## 2022-09-15 ENCOUNTER — Inpatient Hospital Stay: Admit: 2022-09-15 | Payer: MEDICARE | Primary: Internal Medicine

## 2022-09-15 DIAGNOSIS — R2689 Other abnormalities of gait and mobility: Secondary | ICD-10-CM

## 2022-09-15 NOTE — Progress Notes (Signed)
PHYSICAL / OCCUPATIONAL THERAPY - DAILY TREATMENT NOTE (updated 1/23)    Patient Name: Hunter Higgins    Date: 09/15/2022    DOB: September 18, 1950  Insurance: Payor: [employer] MEDICARE / Plan: [employer]HC AARP MEDICARE ADVANTAGE / Product Type: *No Product type* /      Patient DOB verified yes     Visit #   Current / Total 5 10 Total Time   Time   In / Out 9:41 10:30 49   Pain   In / Out 7 7    Subjective Functional Status/Changes: "Tripped on throw rug on Tuesday and fell"     TREATMENT AREA =  Other abnormalities of gait and mobility [R26.89]  Other low back pain [M54.59]    OBJECTIVE  Modalities Rationale:     decrease pain and increase tissue extensibility to improve patient's ability to progress to PLOF and address remaining functional goals.     min []  Estim Unattended, type/location:                                      []   w/ice    []   w/heat    min []  Estim Attended, type/location:                                     []   w/US     []   w/ice    []   w/heat    []   TENS insruct      min []   Mechanical Traction: type/lbs                   []   pro   []   sup   []   int   []   cont    []   before manual    []   after manual    min []   Ultrasound, settings/location:     10 min  unbill []   Ice     [x]   Heat    location/position: L/S; patient semi-reclined with legs elevated on wedge    min []   Paraffin,  details:     min []   Vasopneumatic Device, press/temp:     min []   Whirlpool / Fluido:    If using vaso (only need to measure limb vaso being performed on)      pre-treatment girth :       post-treatment girth :       measured at (landmark location) :      min []   Other:    Skin assessment post-treatment:   Intact       Therapeutic Procedures:  39  Total 39  Total MC BC Totals Reminder: bill using total billable min of TIMED therapeutic procedures (example: do not include dry needle or estim unattended, both untimed codes, in totals to left)  8-22 min = 1 unit; 23-37 min = 2 units; 38-52 min = 3 units; 53-67 min = 4 units; 68-82 min = 5 units    Tx Min Billable or 1:1 Min (if diff from Tx Min) Procedure, Rationale, Specifics   17  97710 Therapeutic Exercise (timed):  increase ROM, strength, coordination, balance, and proprioception to improve patient's ability to progress to PLOF and address remaining functional goals. (see flow sheet as applicable)     Details if applicable: stretching, strengthening   Reassessment for  PN      10  97530 Therapeutic Activity (timed):  use of dynamic activities replicating functional movements to increase ROM, strength, coordination, balance, and proprioception in order to improve patient's ability to progress to PLOF and address remaining functional goals.  (see flow sheet as applicable)     Details if applicable:  squatting, step ups     12  97112 Neuromuscular Re-Education (timed):  improve balance, coordination, kinesthetic sense, posture, core stability and proprioception to improve patient's ability to develop conscious control of individual muscles and awareness of position of extremities in order to progress to PLOF and address remaining functional goals. (see flow sheet as applicable)     Balance (dynamic/static)  -reassessment         [x]   Patient Education billed concurrently with other procedures   [x]  Review HEP    []  Progressed/Changed HEP, detail:    []  Other detail:       Objective Information/Functional Measures/Assessment  Subjective improvements: able to walk to mailbox before c/o LE tightening; increased stability in static stance positions   Walking: 4 minutes with c/o LE tightening  Deficits: balance  C/o LBP 7/10 ave  Pt goal: "TO do daily exercises"    Posture: [x]  Poor      Description: thoracic kyhposis, cervical protraction      Gait:  [x]  Abnormal    Device: does not utilize an assistive device      Description: lateral (R) LOB several times;  mild/mod sway with static stance, decreased TKE, decreased step height, foot drag, upper thoracic kyhposis     Strength (MMT): measured in sitting                                Hip L (1-5) R (1-5)   Hip Flexion 4/5 4/5   Hip Ext 3+/5 3+/5   Hip ABD 4-/5 4-/5   Hip ADD 3+/5 3+/5   Hip IR     Hip ER        Knee L (1-5) R (1-5)   Knee Flexion 4/5 4/5   Knee Extension 4/5 4/5   Ankle DF 4+/5 4+/5      Trunk AROM  Flexion to shins with light knee flexion  (B) SB: functional to the knee     Tone:   [x]  Abnormal    Details:   hypotonia      Motor Control/Coordination:  Finger Tip to Finger Tip: fair  Alternating Hand Supination/Pronation: intact  (B) toe tapping alternating: difficulty with c/o (B) peripheral neuropathy      Balance/ Equilibrium:          Sitting Balance: Static:  [x]  Fair    []  Poor         Standing Balance: Static:   [x]  fair                                     Dynamic:   [x]  Poor     ROM EO: 30"  ROM EC: 30"  MSR (at arch): > 10" (B)  Single Leg Stance:  <1" (B)     mCTSIB: 3/4; foam EC ROM 9 seconds                   Functional Mobility Status      Transfers:  Sit-Stand: able to perform independently, mild genu valgus on the R        Stairs: 1 railing assist on the R, reciprocal gait, decreased eccentric control for descent        Bending: demo's functional squat 2x to pick up pen from floor; anterior trunk lean, anterior weight shift; min unsteadiness on first attempt    Other:       Impaired Vision:                  [x]  Y        Impaired hearing in L ear        Safety Awareness Deficits  [x]  Y        Cognition Deficit :  frequently repeats self; difficulty to redirect conversation at times     Optional Tests:       5 Times Sit to Stand: 12 seconds  with no UE assist  TUG: 20", 13", 11"  2 min walk test (2MWT): 290 feet with sbAx1     Patient will continue to benefit from skilled PT / OT services to modify and progress therapeutic interventions, analyze and address functional mobility deficits, analyze and address ROM deficits, analyze and address strength deficits, analyze and address soft tissue restrictions, analyze and cue for proper  movement patterns, analyze and modify for postural abnormalities, analyze and address imbalance/dizziness, and instruct in home and community integration to address functional deficits and attain remaining goals.    Progress toward goals / Updated goals:  []   See Progress Note/Recertification  All goals reassessed for PN on 09/15/22    Short Term Goals: To be accomplished in 2-4 weeks   Patient to be independent with safety strategies when walking and with transfers (I.e. utilization of AD for gait, keeping phone with him)   Status at IE reviewed  Current: goal not met; discussed importance of daily exercise at home (09/16/22)  2.  Patient will be able to perform sit to stand on first attempt with BUE assist and proper eccentric control with no LOB upon standing.  Status at IE: unsteadiness upon standing, form deficits  Current: goal met; independent and safe (09/16/22)  3. Patient to improve static balance as shown by ability to maintain static stance for at least 45 seconds with minimal sway and LRAD.  Status at IE: CGA due to unsteadiness   Current: goal met; pt demos 3/4 mCTSIB, failing only foam ROM EC 9 seconds (09/16/22)     Long Term Goals: To be accomplished in 8-12 weeks from initial evaluation   Patient will be independent with progressive HEP to facilitate/maintain gains made in PT upon discharge   Status at IE: will establish initial HEP at first follow up session   Current: progressing, established hip strengthening HEP (08/18/22)  2.  Patient will be able to ambulate at least 10 minutes with LRAD and no LOB to indicate improved mobility  Status at IE: unable   Current: 2MWT 290 feet with c/o knee stiffness and pain, independent gait, mild unsteadiness with turning (09/16/22)  3.  Patient will be able to maintain tandem balance bilaterally, with eyes open, on firm surface for at least 15 seconds to indicate improved stability in narrow BOS   Status at IE: unable   Current: goal progressing; MSR at arch on floor,  EO for > 12 seconds (09/16/22)  4.  Patient will restore trunk flexion range of motion as shown by fingertips to mid-shin with no more than  3/10 pain rating and without AD for functional carryover.  Status at IE: unable  Current: flexion to mid shin; increased pain; able to demo' functional and fair safety with squat/bending to floor (09/16/22)     Next PN/ RC due 09/09/22  Auth due NAR    PLAN  [x]  Continue plan of care  [x]   Upgrade activities as tolerated  []   Discharge due to :  [x]   Other: Recommend con't with PT with increased compliance 1x/week for 12-24 sessions. Pt is on compliance watch. If he does not show he will need to be DC.    Sidney Ace, PT    09/15/2022        Future Appointments   Date Time Provider [place]   09/15/2022  9:40 AM Johnnye Lana, PT [facility]   09/20/2022  9:00 AM Johnnye Lana, PT [facility]   09/22/2022 10:00 AM Charm Barges, MD [facility] AM[facility]   09/22/2022 12:20 PM Roanna Raider, PTA [facility]   09/27/2022 10:30 AM Louann Sjogren, MD [facility] AM[facility]   09/27/2022  1:40 PM Roanna Raider, PTA [facility]   10/04/2022 11:40 AM Roanna Raider, PTA [facility]   10/06/2022  8:20 AM Leisa Lenz, PT [facility]   10/11/2022 11:40 AM Leisa Lenz, PT [facility]   10/13/2022 12:20 PM Roanna Raider, PTA [facility]   10/18/2022  9:30 AM Caines, Venia Carbon, MD [facility]

## 2022-09-15 NOTE — Progress Notes (Signed)
[facility]   [address] [place] [address]  Phone: [phone] Fax: [phone]  PROGRESS NOTE  Patient Name: Hunter Higgins DOB: 01-15-1951   Treatment/Medical Diagnosis: Other abnormalities of gait and mobility [R26.89]  Other low back pain [M54.59]   Referral Source:  Payor Louann Sjogren,*  Payor: [facility] MEDICARE / Plan: [facility] AARP MEDICARE ADVANTAGE / Product Type: *No Product type* /      Date of Initial Visit: 08/09/22 Attended Visits: 5 Missed Visits: 4     SUMMARY OF TREATMENT   Patient is a 72 year old male presenting to PT with complaints of impaired balance and low back pain.  Treatment has consisted of Nmred, TE, TA, functional mobility, HEP, pt education     CURRENT STATUS  Pt has been seen for 4 follow up sessions of PT since 12/26 and was originally limited in attendance due to hospitalization with respiratory virus and weakness. Pt has been made aware of our compliance policy and will be scheduled only 1x/week for resulting sessions to demo his increased commitment to attending scheduled sessions.     He has made progress towards / met many of his goals with improved strength, endurance, ROM, less mental confusion and overall self-care improving. He does require significant VC throughout session to remain on task and demo's no HEP compliance which has limited his progress. CC are imbalance, less safety awareness, decreased endurance, decreased functional movement patterns. He'll benefit from PT to restore safety with mobility.     Objective Information/Functional Measures/Assessment  Subjective improvements: able to walk to mailbox before c/o LE tightening; increased stability in static stance positions   Walking: 4 minutes with c/o LE tightening  Deficits: balance  C/o LBP 7/10 ave  Pt goal: "Tt do daily exercises"    Posture: [x]  Poor      Description: thoracic kyhposis, cervical protraction      Gait:  [x]  Abnormal     Device: does not utilize an assistive device      Description: lateral (R) LOB several times;  mild/mod sway with static stance, decreased TKE, decreased step height, foot drag, upper thoracic kyhposis     Strength (MMT): measured in sitting                               Hip L (1-5) R (1-5)   Hip Flexion 4/5 4/5   Hip Ext 3+/5 3+/5   Hip ABD 4-/5 4-/5   Hip ADD 3+/5 3+/5   Hip IR     Hip ER        Knee L (1-5) R (1-5)   Knee Flexion 4/5 4/5   Knee Extension 4/5 4/5   Ankle DF 4+/5 4+/5      Trunk AROM  Flexion to shins with light knee flexion  (B) SB: functional to the knee     Tone:   [x]  Abnormal    Details:   hypotonia      Motor Control/Coordination:  Finger Tip to Finger Tip: fair  Alternating Hand Supination/Pronation: intact  (B) toe tapping alternating: difficulty with c/o (B) peripheral neuropathy      Balance/ Equilibrium:          Sitting Balance: Static:  [x]  Fair    []  Poor         Standing Balance: Static:   [x]  fair  Dynamic:   [x]  Poor     ROM EO: 30"  ROM EC: 30"  MSR (at arch): > 10" (B)  Single Leg Stance:  <1" (B)     mCTSIB: 3/4; foam EC ROM 9 seconds                   Functional Mobility Status      Transfers:              Sit-Stand: able to perform independently, mild genu valgus on the R        Stairs: 1 railing assist on the R, reciprocal gait, decreased eccentric control for descent        Bending: demo's functional squat 2x to pick up pen from floor; anterior trunk lean, anterior weight shift; min unsteadiness on first attempt    Other:       Impaired Vision:                  [x]  Y        Impaired hearing in L ear        Safety Awareness Deficits  [x]  Y        Cognition Deficit :  frequently repeats self; difficulty to redirect conversation at times     Optional Tests:       5 Times Sit to Stand: 12 seconds  with no UE assist  TUG: 20", 13", 11"  2 min walk test (2MWT): 290 feet with sbAx1       Goals for Previous Period:  Short Term Goals: To be  accomplished in 2-4 weeks   Patient to be independent with safety strategies when walking and with transfers (I.e. utilization of AD for gait, keeping phone with him)   Status at IE reviewed  Current: goal not met; discussed importance of daily exercise at home (09/16/22)  2.  Patient will be able to perform sit to stand on first attempt with BUE assist and proper eccentric control with no LOB upon standing.  Status at IE: unsteadiness upon standing, form deficits  Current: goal met; independent and safe (09/16/22)  3. Patient to improve static balance as shown by ability to maintain static stance for at least 45 seconds with minimal sway and LRAD.  Status at IE: CGA due to unsteadiness   Current: goal met; pt demos 3/4 mCTSIB, failing only foam ROM EC 9 seconds (09/16/22)     Long Term Goals: To be accomplished in 8-12 weeks from initial evaluation   Patient will be independent with progressive HEP to facilitate/maintain gains made in PT upon discharge   Status at IE: will establish initial HEP at first follow up session   Current: progressing, established hip strengthening HEP (08/18/22)  2.  Patient will be able to ambulate at least 10 minutes with LRAD and no LOB to indicate improved mobility  Status at IE: unable   Current: 2MWT 290 feet with c/o knee stiffness and pain, independent gait, mild unsteadiness with turning (09/16/22)  3.  Patient will be able to maintain tandem balance bilaterally, with eyes open, on firm surface for at least 15 seconds to indicate improved stability in narrow BOS   Status at IE: unable   Current: goal progressing; MSR at arch on floor, EO for > 12 seconds (09/16/22)  4.  Patient will restore trunk flexion range of motion as shown by fingertips to mid-shin with no more than 3/10 pain rating and without AD for functional carryover.  Status at IE: unable  Current: flexion to mid shin; increased pain; able to demo' functional and fair safety with squat/bending to floor (09/16/22)    New Goals to be  achieved in __12-24__    1. Patient will be independent with progressive HEP to facilitate/maintain gains made in PT upon discharge   Status at PN: established hip strengthening HEP  2.  Patient will be able to ambulate at least 10 minutes with LRAD and no LOB to indicate improved mobility  Status at PN: 2MWT 290 feet with c/o knee stiffness and pain, independent gait, mild unsteadiness with turning   3.  Patient will be able to maintain tandem balance bilaterally, with eyes open, on firm surface for at least 15 seconds to indicate improved stability in narrow BOS   Status at PN:  MSR at arch on floor, EO for > 12 seconds  4.  Patient will restore trunk flexion range of motion as shown by fingertips to mid-shin with no more than 3/10 pain rating and without AD for functional carryover.  Status at PN: flexion to mid shin; increased pain; able to demo' functional and fair safety with squat/bending to floor    Reporting Period: (date from last Prog Note/Eval to current Prog Note/Recert)  67/89/38 - 1/0/17    RECOMMENDATIONS  Continue therapy per initial Plan of Care or most recent Medicare Recert.      If you have any questions/comments please contact us directly.  Thank you for allowing [place] to assist in the care of your patient.    Sidney Ace, PT       09/15/2022       8:20 AM

## 2022-09-19 NOTE — Telephone Encounter (Signed)
Formatting of this note might be different from the original.  09/19/22 attempted to contact pt to confirm appt for 09/22/22 @ 9:30a [email or web address]  Electronically signed by Waynetta Pean at 09/19/2022  3:40 PM EST

## 2022-09-20 ENCOUNTER — Encounter: Payer: MEDICARE | Primary: Internal Medicine

## 2022-09-22 ENCOUNTER — Inpatient Hospital Stay: Admit: 2022-09-22 | Payer: MEDICARE | Primary: Internal Medicine

## 2022-09-22 ENCOUNTER — Encounter
Admit: 2022-09-22 | Discharge: 2022-09-22 | Payer: MEDICARE | Attending: Physical Medicine & Rehabilitation | Primary: Internal Medicine

## 2022-09-22 ENCOUNTER — Encounter

## 2022-09-22 DIAGNOSIS — R221 Localized swelling, mass and lump, neck: Secondary | ICD-10-CM

## 2022-09-22 NOTE — Patient Instructions (Signed)
[facility]    Please expect an automated call within 24-48 business hours to schedule your outpatient study with [place]    If you have not received an automated call, please call [phone] to speak directly with a scheduler    [facility]    [facility]    Will Brandenburg     [facility] [facility]    [place]

## 2022-09-22 NOTE — Progress Notes (Signed)
PHYSICAL / OCCUPATIONAL THERAPY - DAILY TREATMENT NOTE (updated 1/23)    Patient Name: Hunter Higgins    Date: 09/22/2022    DOB: November 20, 1950  Insurance: Payor: UHC MEDICARE / Plan: UHC AARP MEDICARE ADVANTAGE / Product Type: *No Product type* /      Patient DOB verified yes     Visit #   Current / Total 1 10 Total Time   Time   In / Out 12:20 pm 1:05 pm 45   Pain   In / Out 4 3    Subjective Functional Status/Changes: "I am out of shape, man."     TREATMENT AREA =  Other abnormalities of gait and mobility [R26.89]  Other low back pain [M54.59]    OBJECTIVE  PD modalities today.    Therapeutic Procedures:  73  Total 39  Total MC BC Totals Reminder: bill using total billable min of TIMED therapeutic procedures (example: do not include dry needle or estim unattended, both untimed codes, in totals to left)  8-22 min = 1 unit; 23-37 min = 2 units; 38-52 min = 3 units; 53-67 min = 4 units; 68-82 min = 5 units   Tx Min Billable or 1:1 Min (if diff from Tx Min) Procedure, Rationale, Specifics   11 5 97710 Therapeutic Exercise (timed):  increase ROM, strength, coordination, balance, and proprioception to improve patient's ability to progress to PLOF and address remaining functional goals. (see flow sheet as applicable)     Details if applicable: stretching, strengthening     10 10 97530 Therapeutic Activity (timed):  use of dynamic activities replicating functional movements to increase ROM, strength, coordination, balance, and proprioception in order to improve patient's ability to progress to PLOF and address remaining functional goals.  (see flow sheet as applicable)     Details if applicable:  squatting, step ups     8 8 97112 Neuromuscular Re-Education (timed):  improve balance, coordination, kinesthetic sense, posture, core stability and proprioception to improve patient's ability to develop conscious control of individual muscles and awareness of position of extremities in order to progress to PLOF and address remaining  functional goals. (see flow sheet as applicable)     Balance (dynamic/static/reactive)     16 16 [id number] Gait Training (timed):    40 feet with no AD (assistive device) over level surfaces with SBA/CGA/min(A) level of assist. Cuing for maintenance of upright posture.  To improve safety and dynamic movement with household/community ambulation.  (see flow sheet as applicable)     Retrograde  Start/stop  Horizontal head turns  Vertical head turns     Eyes closed (40 feet x 2)  Dual task, walk with self ball toss using purple swissball (40 feet x 2)           [x]   Patient Education billed concurrently with other procedures   [x]  Review HEP    []  Progressed/Changed HEP, detail:    []  Other detail:       Objective Information/Functional Measures/Assessment  Patient with moderate right-sided deviation during eyes closed dynamic walking. Good ability to brake and accelerate during start/stop gait. Min difficulty noted with achieving left-sided weight shift during trunk rotation attempts to the left, resulting in reduced ROM (~50%) as compared to the right.    Patient will continue to benefit from skilled PT / OT services to modify and progress therapeutic interventions, analyze and address functional mobility deficits, analyze and address ROM deficits, analyze and address strength deficits, analyze and address soft tissue  restrictions, analyze and cue for proper movement patterns, analyze and modify for postural abnormalities, analyze and address imbalance/dizziness, and instruct in home and community integration to address functional deficits and attain remaining goals.    Progress toward goals / Updated goals:  []   See Progress Note/Recertification  New Goals to be achieved in __12-24__    1. Patient will be independent with progressive HEP to facilitate/maintain gains made in PT upon discharge   Status at PN: established hip strengthening HEP  2.  Patient will be able to ambulate at least 10 minutes with LRAD and no LOB to  indicate improved mobility  Status at PN: 2MWT 290 feet with c/o knee stiffness and pain, independent gait, mild unsteadiness with turning   3.  Patient will be able to maintain tandem balance bilaterally, with eyes open, on firm surface for at least 15 seconds to indicate improved stability in narrow BOS   Status at PN:  MSR at arch on floor, EO for > 12 seconds  4.  Patient will restore trunk flexion range of motion as shown by fingertips to mid-shin with no more than 3/10 pain rating and without AD for functional carryover.  Status at PN: flexion to mid shin; increased pain; able to demo' functional and fair safety with squat/bending to floor  Next PN/ RC due 09/09/22  Auth due NAR    PLAN  [x]  Continue plan of care  [x]   Upgrade activities as tolerated  []   Discharge due to :  []   Other:_    Roanna Raider, PTA    09/22/2022        Future Appointments   Date Time Provider [place]   09/27/2022 10:30 AM Louann Sjogren, MD GMA BS AMB   09/27/2022  4:00 PM [facility] CT RM 2 [facility]   09/29/2022  1:40 PM Johnnye Lana, PT [facility]   10/06/2022  8:20 AM Leisa Lenz, PT [facility]   10/13/2022 12:20 PM Roanna Raider, PTA [facility]   10/18/2022  9:30 AM Caines, Venia Carbon, MD [facility]GS BS AMB

## 2022-09-22 NOTE — Progress Notes (Signed)
[facility]  [address], [place] [address]  Phone: [phone]  Fax: [phone]      Patient: Geza Beranek                                                                              MRN: [id number]        Date of Birth: 07-Jan-1951          AGE: 72 y.o.             PCP: Louann Sjogren, MD  Date:  09/22/22    Reason for Consultation: Back Pain      HPI:  Rylei Codispoti is a 72 y.o. male with relevant PMH of DM, charcot foot, CKD on dialysis  PVD,  CAD who presents neck pain and large posterior neck pain.  He reports this mass has been present for many years but he feels it is getting larger.  He saw Dr. Bridget Hartshorn 02/24/2022- he did not recommend removing at the time due to his other medical issues, he did order  CT scan of his neck but the patient has not had it done yet. He denies significant pain but does find it un comfortable when he is lying in the bed for dialysis .      Neurologic symptoms: No numbness, tingling, weakness, bowel or bladder changes.  No recent falls      Location: The pain is located in the low back pain   Radiation: The pain does not radiate .    Pain Score: Currently: 4/10   Quality: Pain is of a sharp, dull, achingquality.    Aggravating: Pain is exacerbated by walking and lying down  Alleviating: The pain is alleviated by sitting    Prior Treatments:  Physical therapy: Yes- completed PT 08/09/2022- restarting PT  today   Injections:No  Surgery:No  Previous Medications:   Current Medications: gabapentin 300mg  bid   Previous work-up has included:   Ct cervical spine 07/25/2021  Degenerative changes: Moderate degenerative disc disease, predominantly at C3-C4 and C5-T1. Mild degeneration at the C1-C2 articulation. Uncovertebral spurring at C3-C4 and throughout the mid cervical spine at C5-T1 with perhaps mild cervical spinal stenosis at the level of C6/C7. No significant foraminal stenosis bilaterally.   Past Medical  History:   Past Medical History:   Diagnosis Date    CAD (coronary artery disease)     Diabetes ([place])     Diabetic foot ulcer ([place])     Left    Dialysis patient ([place])     ESRD (end stage renal disease) ([place])     Foot ulcer ([place])     Hypertension     Retinopathy     Visual impairment       Past Surgical History:   Past Surgical History:   Procedure Laterality Date    DEBRIDEMENT OPEN WOUND 20 SQ CM< Left     foot    PROSTATE SURGERY        SocHx:   Social History     Tobacco Use    Smoking status: Former     Current packs/day: 0.00     Types:  Cigarettes     Start date: 11/04/1967     Quit date: 11/03/1968     Years since quitting: 53.9     Passive exposure: Past    Smokeless tobacco: Never   Substance Use Topics    Alcohol use: Yes      FamHx:? History reviewed. No pertinent family history.    Current Medications:   Current Outpatient Medications   Medication Sig Dispense Refill    aspirin 81 MG chewable tablet Take 1 tablet by mouth daily      Dulaglutide (TRULICITY) 2.95 MW/4.1LK SOPN Inject 0.75 mg into the skin every 7 days 5 Adjustable Dose Pre-filled Pen Syringe 3    gabapentin (NEURONTIN) 300 MG capsule Take 1 capsule by mouth in the morning and at bedtime for 180 days. Max Daily Amount: 600 mg 180 capsule 1    glucose monitoring kit 1 kit by Does not apply route daily 1 kit 0    glucose monitoring (FREESTYLE FREEDOM) kit 1 kit by Does not apply route daily 1 kit 0    atorvastatin (LIPITOR) 20 MG tablet Take 1 tablet by mouth nightly (Patient not taking: Reported on 09/22/2022)      dexAMETHasone (DECADRON) 4 MG tablet 1 tab twice daily for 3 days then 1/2 tab twice daily for 3 days then 1/2 tab once daily for 3 days then stop. (Patient not taking: Reported on 09/22/2022)      acetaminophen (TYLENOL) 325 MG tablet Take by mouth (Patient not taking: Reported on 07/26/2022)      insulin lispro (HUMALOG) 100 UNIT/ML SOLN injection vial Inject into the skin (Patient not taking: Reported on 07/26/2022)       No current  facility-administered medications for this visit.      Allergies:    Allergies   Allergen Reactions    Ace Inhibitors Cough, Itching and Other (See Comments)     cough  Other reaction(s): Cough  cough  cough    Naproxen Anaphylaxis, Shortness Of Breath and Swelling     Other reaction(s): gi distress, Other, Other (See Comments)  Throat swells. cannot breathe, and causes GI distress  REACTION: Throat swells and cannot breathe  anaphylaxis  REACTION: Throat swells and cannot breathe  anaphylaxis  Throat swells. cannot breathe, and causes GI distress             Physical Exam     Vital Signs: Ht 1.702 m (5\' 7" )   Wt 76.7 kg (169 lb)   BMI 26.47 kg/m    General: ???????  male Body mass index is 26.47 kg/m. without any acute distress   Psychiatric: ?  Alert and oriented x 3 with normal mood    HEENT: ????????  Atraumatic   Respiratory:   Breathing non-labored and non dyspneic   CV: ???????????????? Peripheral pulses intact, no peripheral edema   Skin: ?????????????  No rashes       Neurologic: ??      Sensation: normal and grossly intact thebilateral, upper extremity(s)   Strength: 5/5 in the bilateral, upper extremity(s)   Reflexes: reveals 2+ symmetric DTRs  Gait: normal   Upper tract signs: Hoffman's negative ?       Musculoskeletal: Cervical Exam   Alignment: Normal  Atrophy: None   Large posterior cervical mass- non tender    Tenderness to Palpation:   Cervical paraspinals: Negative  Cervical spinous processes: Negative  Upper thoracic paraspinals: Negative  Upper thoracic spinous processes: Negative  Upper trapezius:  Negative  Cervical ROM: Abnormal limited extension   Shoulder ROM: No reproduction of pain with movement     Special Tests    Spurlings Maneuver: Negative          Assessment:   1. Neck mass       Plan:      -recommend he have CT soft tissue neck to evaluate mass as recommended per Dr Joaquin Courts - appointment scheduled for him today  -Physical therapy -  continue PT  -Medications - NA. Counseled  regarding side effects and appropriate administration of medications.    -Discussed surgery if it is painful or limiting his range of motion but he has many medical co morbidities   F/U - as needed         [facility]

## 2022-09-22 NOTE — Telephone Encounter (Signed)
Patient called refill request for the following medication    gabapentin (NEURONTIN) 300 MG capsule [[id number]]    Order Details  Dose: 300 mg Route: Oral Frequency: 2 times daily   Dispense Quantity: 180 capsule Refills: 1          Sig: Take 1 capsule by mouth in the morning and at bedtime for 180 days. Max Daily Amount: 600 mg     Last Appointment:  08/23/2022  Future Appointments   Date Time Provider [place]   09/27/2022 10:30 AM Louann Sjogren, MD GMA BS AMB   09/27/2022  4:00 PM [facility] CT RM 2 [facility]   09/29/2022  1:40 PM Johnnye Lana, PT [facility]   10/06/2022  8:20 AM Leisa Lenz, PT [facility]   10/13/2022 12:20 PM Roanna Raider, PTA [facility]   10/18/2022  9:30 AM Caines, Venia Carbon, MD [facility] BS AMB

## 2022-09-22 NOTE — Progress Notes (Signed)
Hunter Higgins presents today for   Chief Complaint   Patient presents with    Back Pain       Is someone accompanying this pt? no    Is the patient using any DME equipment during OV? no    Depression Screening:       No data to display                Learning Assessment:      Abuse Screening:       No data to display                Fall Risk      OPIOID RISK TOOL      Coordination of Care:  1. Have you been to the ER, urgent care clinic since your last visit? Yes resp infection  Hospitalized since your last visit? Yes for week    2. Have you seen or consulted any other health care providers outside of the [place] since your last visit? no Include any pap smears or colon screening. no

## 2022-09-27 ENCOUNTER — Ambulatory Visit: Payer: MEDICARE | Primary: Internal Medicine

## 2022-09-27 ENCOUNTER — Encounter: Payer: MEDICARE | Primary: Internal Medicine

## 2022-09-27 ENCOUNTER — Ambulatory Visit: Payer: MEDICARE | Attending: Internal Medicine | Primary: Internal Medicine

## 2022-09-29 ENCOUNTER — Inpatient Hospital Stay: Admit: 2022-09-29 | Payer: MEDICARE | Primary: Internal Medicine

## 2022-09-29 ENCOUNTER — Ambulatory Visit: Admit: 2022-09-29 | Discharge: 2022-09-29 | Payer: MEDICARE | Attending: Internal Medicine | Primary: Internal Medicine

## 2022-09-29 DIAGNOSIS — E1169 Type 2 diabetes mellitus with other specified complication: Secondary | ICD-10-CM

## 2022-09-29 LAB — AMB POC HEMOGLOBIN A1C: Hemoglobin A1C, POC: 6.8 %

## 2022-09-29 MED ORDER — TRULICITY 0.75 MG/0.5ML SC SOPN
0.750.5 MG/0.5ML | SUBCUTANEOUS | 2 refills | Status: AC
Start: 2022-09-29 — End: 2023-03-16

## 2022-09-29 MED ORDER — ATORVASTATIN CALCIUM 20 MG PO TABS
20 MG | ORAL_TABLET | Freq: Every evening | ORAL | 1 refills | Status: AC
Start: 2022-09-29 — End: 2023-03-16

## 2022-09-29 MED ORDER — GLUCOSE MONITORING KIT
PACK | 0 refills | Status: AC
Start: 2022-09-29 — End: 2023-01-03

## 2022-09-29 NOTE — Progress Notes (Signed)
PHYSICAL / OCCUPATIONAL THERAPY - DAILY TREATMENT NOTE (updated 1/23)    Patient Name: Hunter Higgins    Date: 09/29/2022    DOB: December 06, 1950  Insurance: Payor: UHC MEDICARE / Plan: UHC AARP MEDICARE ADVANTAGE / Product Type: *No Product type* /      Patient DOB verified yes     Visit #   Current / Total 2 10 Total Time   Time   In / Out 1:41 2:31 50   Pain   In / Out 5 4    Subjective Functional Status/Changes: No changes reported     TREATMENT AREA =  Other abnormalities of gait and mobility [R26.89]  Other low back pain [M54.59]    OBJECTIVE    Modalities Rationale:     decrease pain and improve tissue extensibility to improve patient's ability to progress to PLOF and address remaining functional goals.             -  10 min   []$   Ice     [x]$   Heat    location/position: seated, low back    Skin assessment post-treatment (if applicable):    [x]$   intact    []$   redness- no adverse reaction                 []$ redness - adverse reaction:           Therapeutic Procedures:  40  Total 24  Total MC BC Totals Reminder: bill using total billable min of TIMED therapeutic procedures (example: do not include dry needle or estim unattended, both untimed codes, in totals to left)  8-22 min = 1 unit; 23-37 min = 2 units; 38-52 min = 3 units; 53-67 min = 4 units; 68-82 min = 5 units   Tx Min Billable or 1:1 Min (if diff from Tx Min) Procedure, Rationale, Specifics   10 0 97710 Therapeutic Exercise (timed):  increase ROM, strength, coordination, balance, and proprioception to improve patient's ability to progress to PLOF and address remaining functional goals. (see flow sheet as applicable)   Details if applicable:       15 9 0000000 Therapeutic Activity (timed):  use of dynamic activities replicating functional movements to increase ROM, strength, coordination, balance, and proprioception in order to improve patient's ability to progress to PLOF and address remaining functional goals.  (see flow sheet as applicable)   Details if  applicable:       15 15 97116 Gait Training (timed):      40 feet x 2 of following-  []$  Start/Stop             [x]$  Horizontal HT  []$  Tandem         []$  Banded Lateral     [x]$  Vertical HT  [x]$  Retrograde    []$  Banded Monster   []$  Dual Task  [x]$  Eyes Closed         [x]$   Patient Education billed concurrently with other procedures   [x]$  Review HEP    []$  Progressed/Changed HEP, detail:    []$  Other detail:       Objective Information/Functional Measures/Assessment    -Continued deviation to right noted with eyes closed gait training but pt demonstrates improved performance when given target to walk towards.  -Squat depth limited by inc LBP.  -Discussed use of water gallon squat and functional carry at home to improve ease with prolonged standing    Patient will continue to benefit from skilled PT / OT  services to modify and progress therapeutic interventions, analyze and address functional mobility deficits, analyze and address ROM deficits, analyze and address strength deficits, analyze and address soft tissue restrictions, analyze and cue for proper movement patterns, analyze and modify for postural abnormalities, analyze and address imbalance/dizziness, and instruct in home and community integration to address functional deficits and attain remaining goals.    Progress toward goals / Updated goals:  []$   See Progress Note/Recertification    1. Patient will be independent with progressive HEP to facilitate/maintain gains made in PT upon discharge   Status at PN: established hip strengthening HEP  2.  Patient will be able to ambulate at least 10 minutes with LRAD and no LOB to indicate improved mobility  Status at PN: 2MWT 290 feet with c/o knee stiffness and pain, independent gait, mild unsteadiness with turning   3.  Patient will be able to maintain tandem balance bilaterally, with eyes open, on firm surface for at least 15 seconds to indicate improved stability in narrow BOS   Status at PN:  MSR at arch on floor, EO for  > 12 seconds  4.  Patient will restore trunk flexion range of motion as shown by fingertips to mid-shin with no more than 3/10 pain rating and without AD for functional carryover.  Status at PN: flexion to mid shin; increased pain; able to demo' functional and fair safety with squat/bending to floor  Next PN/ RC due 09/09/22  Auth due NAR    PLAN  [x]$  Continue plan of care  [x]$   Upgrade activities as tolerated  []$   Discharge due to :  []$   Other:    Leisa Lenz, PT    09/29/2022    7:56 AM    Future Appointments   Date Time Provider [place]   09/29/2022  1:40 PM Leisa Lenz, PT [facility]   10/06/2022  8:20 AM Leisa Lenz, PT [facility]   10/13/2022 12:20 PM Roanna Raider, PTA [facility]   10/18/2022  9:30 AM Caines, Venia Carbon, MD [facility] BS AMB

## 2022-09-29 NOTE — Progress Notes (Signed)
Subjective:      Patient ID: Hunter Higgins is a 72 y.o. male.    Follow-up    Patient is requesting evaluation by home health.  He had a fall about a week ago, he hit his head, forehead, no loss of consciousness.  He tripped on something on the floor in the house.  He did not lose consciousness.  He did not go to emergency.  He had no complaints about it.    Assessed fall risk.  Discussed fall precautions.  Ordered rollator.  Advised not to take aspirin after having a trauma due to risk of bleeding.  He did not take aspirin.  Neurological exam unremarkable.  Refer to home health.  Referral ENT for evaluation of hearing.  A1c POC 6.8.  Patient congratulated.  Can continue Trulicity, no change in dosage.  Ordered glucometer monitor sugars at home twice a day at least.        GERD  famotidine (PEPCID) 20 MG tablet, twice daily as needed    Diabetes mellitus type 2  Dulaglutide (TRULICITY) A999333 0000000 SOPN, weekly.    Diabetic neuropathy  gabapentin (NEURONTIN) 300 MG capsule, Take 1 capsule by mouth 3 times daily.    ESRD  On dialysis 3 times a day.    OTC  aspirin 81 MG chewable tablet.    Fall  Pertinent negatives include no abdominal pain or fever.   Shortness of Breath  Pertinent negatives include no abdominal pain, chest pain or fever.       Review of Systems   Constitutional:  Negative for chills and fever.   Respiratory:  Negative for shortness of breath.    Cardiovascular:  Negative for chest pain.   Gastrointestinal:  Negative for abdominal pain.       Objective:   Visit Vitals  BP 138/60 (Site: Left Upper Arm, Position: Sitting, Cuff Size: Medium Adult)   Pulse (!) 47   Temp 98.7 F (37.1 C) (Temporal)   Ht 1.702 m (5' 7"$ )   Wt 75.3 kg (166 lb)   SpO2 92%   BMI 26.00 kg/m        Physical Exam  Eyes:      General: No visual field deficit.  Cardiovascular:      Rate and Rhythm: Normal rate and regular rhythm.   Pulmonary:      Effort: Pulmonary effort is normal.      Breath sounds: Normal breath sounds.       Comments: No crackles or wheezing  Abdominal:      General: Abdomen is flat. Bowel sounds are normal.   Neurological:      General: No focal deficit present.      Mental Status: He is alert.      Cranial Nerves: No cranial nerve deficit, dysarthria or facial asymmetry.      Sensory: Sensation is intact.      Motor: Motor function is intact. No weakness.      Coordination: Coordination is intact.      Deep Tendon Reflexes:      Reflex Scores:       Tricep reflexes are 2+ on the right side and 2+ on the left side.       Bicep reflexes are 2+ on the right side and 2+ on the left side.       Brachioradialis reflexes are 2+ on the right side and 2+ on the left side.       Patellar reflexes are 2+ on the  right side and 2+ on the left side.     Comments: Gait unchanged since last appointment.         Assessment:       ICD-10-CM    1. Type 2 diabetes mellitus with other specified complication, with long-term current use of insulin (HCC)  E11.69 atorvastatin (LIPITOR) 20 MG tablet    Z79.4 AMB POC HEMOGLOBIN A1C     Comprehensive Metabolic Panel     Lipid Panel     glucose monitoring kit      2. Hearing loss of left ear, unspecified hearing loss type  H91.92 External Referral To ENT      3. Hyperlipidemia, unspecified hyperlipidemia type  E78.5 atorvastatin (LIPITOR) 20 MG tablet     Comprehensive Metabolic Panel     Lipid Panel      4. Physical deconditioning  R53.81 DME Order for (Specify) as OP      5. Fall, initial encounter  W19.Merril Abbe DME Order for [facility]) as OP      6. End-stage renal disease (ESRD) ([place])  N18.6 DME Order for (Specify) as OP      7. Diabetes mellitus due to underlying condition with hyperosmolarity without nonketotic hyperglycemic-hyperosmolar coma [facility]) (HCC)  E08.00 Dulaglutide (TRULICITY) [id number] 0000000 SOPN      8. Need for home health care  Z74.2 [place]:   Return in about 3 months (around 12/28/2022).      Louann Sjogren, MD

## 2022-09-30 LAB — COMPREHENSIVE METABOLIC PANEL
ALT: 41 U/L (ref 16–61)
AST: 22 U/L (ref 10–38)
Albumin/Globulin Ratio: 0.9 (ref 0.8–1.7)
Albumin: 3.6 g/dL (ref 3.4–5.0)
Alk Phosphatase: 164 U/L — ABNORMAL HIGH (ref 45–117)
Anion Gap: 9 mmol/L (ref 3.0–18)
BUN/Creatinine Ratio: 4 — ABNORMAL LOW (ref 12–20)
BUN: 36 MG/DL — ABNORMAL HIGH (ref 7.0–18)
CO2: 29 mmol/L (ref 21–32)
Calcium: 9.6 MG/DL (ref 8.5–10.1)
Chloride: 98 mmol/L — ABNORMAL LOW (ref 100–111)
Creatinine: 8.15 MG/DL — ABNORMAL HIGH (ref 0.6–1.3)
Est, Glom Filt Rate: 6 mL/min/{1.73_m2} — ABNORMAL LOW (ref >60)
Globulin: 4 g/dL (ref 2.0–4.0)
Glucose: 112 mg/dL — ABNORMAL HIGH (ref 74–99)
Potassium: 4.4 mmol/L (ref 3.5–5.5)
Sodium: 136 mmol/L (ref 136–145)
Total Bilirubin: 0.4 MG/DL (ref 0.2–1.0)
Total Protein: 7.6 g/dL (ref 6.4–8.2)

## 2022-09-30 LAB — LIPID PANEL
Chol/HDL Ratio: 2.3 (ref 0–5.0)
Cholesterol, Total: 148 MG/DL (ref <200)
HDL: 64 MG/DL — ABNORMAL HIGH (ref 40–60)
LDL Calculated: 66.4 MG/DL (ref 0–100)
Triglycerides: 88 MG/DL (ref <150)
VLDL Cholesterol Calculated: 17.6 MG/DL

## 2022-10-04 ENCOUNTER — Encounter: Payer: MEDICARE | Primary: Internal Medicine

## 2022-10-06 ENCOUNTER — Inpatient Hospital Stay: Admit: 2022-10-06 | Payer: MEDICARE | Primary: Internal Medicine

## 2022-10-06 NOTE — Progress Notes (Signed)
PHYSICAL / OCCUPATIONAL THERAPY - DAILY TREATMENT NOTE (updated 1/23)    Patient Name: Hunter Higgins    Date: 10/06/2022    DOB: 01-Aug-1951  Insurance: Payor: UHC MEDICARE / Plan: UHC AARP MEDICARE ADVANTAGE / Product Type: *No Product type* /      Patient DOB verified yes     Visit #   Current / Total 3 10 Total Time   Time   In / Out 8:25 9:12 47   Pain   In / Out 9 7    Subjective Functional Status/Changes: I lost 100 dollars on the bus on the way here.       TREATMENT AREA =  Other abnormalities of gait and mobility [R26.89]  Other low back pain [M54.59]    OBJECTIVE    Modalities Rationale:     decrease pain and improve tissue extensibility to improve patient's ability to progress to PLOF and address remaining functional goals.             -  10 min   '[]'$   Ice     '[x]'$   Heat    location/position: Low back, supine    Skin assessment post-treatment (if applicable):    '[x]'$   intact    '[]'$   redness- no adverse reaction                 '[]'$ redness - adverse reaction:           Therapeutic Procedures:  37  Total 24  Total MC BC Totals Reminder: bill using total billable min of TIMED therapeutic procedures (example: do not include dry needle or estim unattended, both untimed codes, in totals to left)  8-22 min = 1 unit; 23-37 min = 2 units; 38-52 min = 3 units; 53-67 min = 4 units; 68-82 min = 5 units   Tx Min Billable or 1:1 Min (if diff from Tx Min) Procedure, Rationale, Specifics   27 10 [id number] Therapeutic Exercise (timed):  increase ROM, strength, coordination, balance, and proprioception to improve patient's ability to progress to PLOF and address remaining functional goals. (see flow sheet as applicable)   Details if applicable:       14 14 [id number] Neuromuscular Re-Education (timed):  improve balance, coordination, kinesthetic sense, posture, core stability and proprioception to improve patient's ability to develop conscious control of individual muscles and awareness of position of extremities in order to progress to  PLOF and address remaining functional goals. (see flow sheet as applicable)     Details if applicable:             '[x]'$   Patient Education billed concurrently with other procedures   '[x]'$  Review HEP    '[]'$  Progressed/Changed HEP, detail:    '[]'$  Other detail:       Objective Information/Functional Measures/Assessment    -Pt highly distractible during session today, frustrated that therapist disrupted his conversation with receptionist and then stopping mid session to make a phone call even when reminded of limited time quality of today's session.  -MSR Floor 30" B    Patient will continue to benefit from skilled PT / OT services to modify and progress therapeutic interventions, analyze and address functional mobility deficits, analyze and address ROM deficits, analyze and address strength deficits, analyze and address soft tissue restrictions, analyze and cue for proper movement patterns, analyze and modify for postural abnormalities, analyze and address imbalance/dizziness, and instruct in home and community integration to address functional deficits and attain remaining goals.  Progress toward goals / Updated goals:  '[]'$   See Progress Note/Recertification    1. Patient will be independent with progressive HEP to facilitate/maintain gains made in PT upon discharge   Status at PN: established hip strengthening HEP  2.  Patient will be able to ambulate at least 10 minutes with LRAD and no LOB to indicate improved mobility  Status at PN: 2MWT 290 feet with c/o knee stiffness and pain, independent gait, mild unsteadiness with turning   3.  Patient will be able to maintain tandem balance bilaterally, with eyes open, on firm surface for at least 15 seconds to indicate improved stability in narrow BOS   Status at PN:  MSR at arch on floor, EO for > 12 seconds  Current: progressing, MSR at arch 30" B (10/06/22)  4.  Patient will restore trunk flexion range of motion as shown by fingertips to mid-shin with no more than 3/10 pain  rating and without AD for functional carryover.  Status at PN: flexion to mid shin; increased pain; able to demo' functional and fair safety with squat/bending to floor  Next PN/ RC due 09/09/22  Auth due NAR    PLAN  '[x]'$  Continue plan of care  '[x]'$   Upgrade activities as tolerated  '[]'$   Discharge due to :  '[]'$   Other:    Leisa Lenz, PT    10/06/2022    6:53 AM    Future Appointments   Date Time Provider [place]   10/06/2022  8:20 AM Leisa Lenz, PT [facility]   10/13/2022 12:20 PM Roanna Raider, PTA [facility]   10/18/2022  9:30 AM Regino Bellow Venia Carbon, MD [facility]   12/29/2022  8:30 AM Louann Sjogren, MD [facility]

## 2022-10-06 NOTE — Telephone Encounter (Signed)
Pt inquiring about Home Health referral?

## 2022-10-07 NOTE — ED Notes (Signed)
Formatting of this note might be different from the original.  .  Electronically signed by Lisbeth Renshaw, RN at 10/07/2022 10:25 PM EST

## 2022-10-07 NOTE — ED Triage Notes (Signed)
Formatting of this note might be different from the original.  C/o hearing loss in right ear, having gait problems, and generalized weakness. Symptoms started today during dialysis. Finished being dialyzed. Denies pain.   Pt has fallen 3 times. BS 147.  Electronically signed by Pierce Crane, RN at 10/07/2022  9:14 PM EST

## 2022-10-07 NOTE — ED Notes (Signed)
Formatting of this note might be different from the original.  Client requesting to ambulate to the bathroom at this time. However, noted to have unsteady gait and reports multiple falls at home.  Client offered urinal and a standby assist while in the restroom.  Declines and states "I told myself I wouldn't fall again so I won't fall."  However, client reports fall tonight into bed states "I didn't fall but I crashed into my mattress."  Fall education performed at this time and fall risk bracelet in place.   Electronically signed by Lisbeth Renshaw, RN at 10/07/2022  9:21 PM EST

## 2022-10-07 NOTE — ED Provider Notes (Signed)
Formatting of this note is different from the original.    [place]    Time of Arrival:   10/07/22 2057    Final diagnoses:   [H93.13] Ear ringing sound, bilateral (Primary)   [N18.6] End stage renal disease ([place])   [D17.0] Lipoma of neck   [E87.8] Dialysis disequilibrium syndrome     Medical Decision Making:      Differential Diagnosis:   Dialysis disequilibrium, vertigo, intracranial hemorrhage, intracranial mass, stroke, TIA, electrolyte abnormality, metabolic abnormality    Social Determinants of Health:  social factors reviewed, did not limit treatment     Supplemental Historians include:  patient    ED Course:     72 year old male with a past medical history of end-stage renal disease, hemodialysis Monday Wednesday Friday, hypertension, CAD, diabetes, CHF, here for generalized weakness, ear ringing, presyncopal episode.  Vital signs within normal limits.  Patient well-appearing, nontoxic, no signs or symptoms of infection.  Given his symptoms started after dialysis, would consider dialysis disequilibrium.  Consider electrolyte versus metabolic abnormality as well.  No focal deficits to suggest CVA or TIA.  TMs clear, no nystagmus, no vertiginous symptoms.  Will get CT head, neck, labs and reassess.    ED Course as of 10/08/22 0156   Fri Oct 07, 2022   2238 HGB(!): 10.7  This is his baseline [AG]     Labs reassuring.  Imaging reassuring.  Patient feeling improved.  Discharged home with follow-up with his primary care doctor regarding today's visit.    Documentation/Prior Results Review:  Old medical records  I reviewed his discharge summary from 08/27/2022, patient was admitted for infraglottic edema secondary to RSV infection  Rhythm interpretation from monitor: normal sinus rhythm    Imaging Interpreted by me: CT no obvious hemorrhage or midline shift primary    EKG 12-LEAD   Final Result     ED CT HEAD NO CONTRAST   Final Result   1. No acute intracranial finding.   2.  Moderate paranasal sinus disease, most notable in the left maxillary sinus.     A preliminary impression was recorded in PACS at 10/07/2022 10:08 PM by Rada Hay, MD, the on call radiology resident.     Dictated ByRada Hay on 10/07/2022 10:08 PM     As attending radiologist, I have assessed the study images, and dictated or reviewed/edited the final report as needed.     Signed By: Abelino Derrick, MD on 10/07/2022 10:18 PM       CT CERVICAL SPINE W/O CONTRAST   Final Result   1. No acute traumatic fracture or subluxation of the cervical spine.   2. Multilevel degenerative disc disease further described in body of report.   3. Partially visualized probable lipoma at posterior neck     A preliminary impression was recorded in PACS at 10/07/2022 10:14 PM by Rada Hay, MD, the on call radiology resident.     Dictated ByRada Hay on 10/07/2022 10:14 PM     As attending radiologist, I have assessed the study images, and dictated or reviewed/edited the final report as needed.     Signed By: Abelino Derrick, MD on 10/07/2022 10:22 PM         .     Discussion of Management with other Physicians, QHP or Appropriate Source:   None    .    Disposition:  Home    New Prescriptions    No medications on file  Chief Complaint   Patient presents with    RINGING IN EAR    GAIT PROBLEM     HPI   72 year old male with a past medical history of end-stage renal disease, hemodialysis Monday Wednesday Friday, hypertension, CAD, diabetes, CHF, here for ear ringing.  Patient reports he went to dialysis today and afterwards was not feeling well.  He did note that his blood pressure was on the softer side during dialysis and he had to give him a little bit extra fluids.  When he got home he was not feeling well, generally weak, tried eating oatmeal and that did not help.  He had some ear ringing suddenly in his head.  But like he was "passed out.  No loss of consciousness.  No fall or hit his head.  He currently feels improved.  No  vomiting or diarrhea.  Review of Systems    Physical Exam  Vitals and nursing note reviewed.   Constitutional:       General: He is not in acute distress.  HENT:      Head: Normocephalic.      Right Ear: Tympanic membrane normal.      Left Ear: Tympanic membrane normal.   Neck:      Comments: Posterior neck with large lipoma palpable  Cardiovascular:      Pulses: Normal pulses.   Pulmonary:      Effort: Pulmonary effort is normal.      Breath sounds: Normal breath sounds.   Abdominal:      General: There is no distension.      Palpations: Abdomen is soft.      Tenderness: There is no abdominal tenderness. There is no guarding or rebound.   Neurological:      General: No focal deficit present.      Mental Status: He is alert and oriented to person, place, and time.     Past Medical History:   Diagnosis Date    Abnormal nuclear stress test     Chronic diastolic (congestive) heart failure (HCC)     Chronic kidney disease, stage IV (severe) (HCC)     Diabetic foot ulcer (HCC)     Diabetic retinopathy ([place])     DM2 (diabetes mellitus, type 2) ([place])     Essential (primary) hypertension     GERD (gastroesophageal reflux disease)     Male erectile dysfunction, unspecified     Peripheral arterial disease (HCC)     Prostate cancer (HCC)     Sarcoidosis, unspecified     In remission    SBO (small bowel obstruction) (HCC)     Vitreous hemorrhage (HCC)      Past Surgical History:   Procedure Laterality Date    EXPLORATORY LAPAROTOMY      Lysis of adhesions    HEART CATHETERIZATION  12/30/2020      HERNIA REPAIR      Inguinal    ORTHOPEDIC SURGERY      Shoulder    RADICAL PROSTATECTOMY      VENOUS ACCESS CATHETER INSERTION N/A 12/28/2020    Procedure: INSERTION, CATHETER, TUNNELED, FOR DIALYSIS;  Surgeon: Ned Grace, MD    VENOUS ACCESS CATHETER REPLACEMENT N/A 04/09/2021    Procedure: REPLACEMENT, CATHETER, HEMODIALYSIS, TUNNELED;  Surgeon: Doris Cheadle, Memory Argue, MD     Family History   Problem Relation Age of Onset    Diabetes  Mother      Social History     Occupational History  Not on file   Tobacco Use    Smoking status: Former    Smokeless tobacco: Never   Vaping Use    Vaping Use: Never used   Substance and Sexual Activity    Alcohol use: Yes     Comment: Occasional social    Drug use: Never    Sexual activity: Not Currently     No outpatient medications have been marked as taking for the 10/07/22 encounter [facility] Encounter).     Allergies   Allergen Reactions    Naproxen gi distress     Vital Signs:  Patient Vitals for the past 72 hrs:   Temp Heart Rate Resp BP BP Mean SpO2 Weight   10/07/22 2300 98.4 F (36.9 C) 65 16 95/48 (!) 64 MM HG 99 % 74.8 kg (165 lb)   10/07/22 2106 99.1 F (37.3 C) 80 16 120/58 79 MM HG 99 % 73 kg (160 lb 14.4 oz)     Diagnostics:  Labs:    Results for orders placed or performed during the hospital encounter of 10/07/22   GLUCOSE SCREEN   Result Value Ref Range    Glucose POC 147 (H) 70 - 99 mg/dL   BASIC METABOLIC PANEL   Result Value Ref Range    Potassium 5.1 3.5 - 5.5 mmol/L    Sodium 139 133 - 145 mmol/L    Chloride 99 98 - 110 mmol/L    Glucose 177 (H) 70 - 99 mg/dL    Calcium 9.0 8.4 - 10.5 mg/dL    BUN 38 (H) 6 - 22 mg/dL    Creatinine 7.4 (H) 0.8 - 1.6 mg/dL    CO2 23 20 - 32 mmol/L    eGFR 7.4 (L) >60.0 mL/min/1.73 sq.m.    Anion Gap 17.0 (H) 3.0 - 15.0 mmol/L   HEPATIC FUNCTION PANEL   Result Value Ref Range    Albumin 4.2 3.5 - 5.0 g/dL    Total Protein 7.4 6.2 - 8.1 g/dL    Globulin 3.2 2.0 - 4.0 g/dL    A/G Ratio 1.3 1.1 - 2.6 ratio    Bilirubin Total 0.2 0.2 - 1.2 mg/dL    Bilirubin Direct <0.2 0.0 - 0.3 mg/dL    SGOT (AST) 36 10 - 37 U/L    Alkaline Phosphatase 151 (H) 40 - 125 U/L    SGPT (ALT) 35 5 - 40 U/L   CBC WITH DIFFERENTIAL AUTO   Result Value Ref Range    WBC 4.8 4.0 - 11.0 K/uL    RBC 4.66 3.80 - 5.80 M/uL    HGB 10.7 (L) 12.6 - 17.1 g/dL    HCT 34.8 (L) 37.8 - 52.2 %    MCV 75 (L) 80 - 95 fL    MCH 23 (L) 26 - 34 pg    MCHC 31 31 - 36 g/dL    RDW 16.4 (H) 10.0 - 15.5 %     Platelet 211 140 - 440 K/uL    MPV 10.8 9.0 - 13.0 fL    Segmented Neutrophils (Auto) 60 40 - 75 %    Lymphocytes (Auto) 22 20 - 45 %    Monocytes (Auto) 13 (H) 3 - 12 %    Eosinophils (Auto) 4 0 - 6 %    Basophils (Auto) 1 0 - 2 %    Absolute Neutrophils (Auto) 2.9 1.8 - 7.7 K/uL    Absolute Lymphocytes (Auto) 1.1 1.0 - 4.8 K/uL    Absolute Monocytes (Auto)  0.6 0.1 - 1.0 K/uL    Absolute Eosinophils (Auto) 0.2 0.0 - 0.5 K/uL    Absolute Basophils (Auto) 0.1 0.0 - 0.2 K/uL     ECG:  Results for orders placed or performed during the hospital encounter of 10/07/22   EKG 12-LEAD   Result Value Ref Range Status    Heart Rate 63 bpm Final    RR Interval 952 ms Final    Atrial Rate 63 ms Final    P-R Interval 303 ms Final    P Duration 159 ms Final    P Horizontal Axis -75 deg Final    P Front Axis 113 deg Final    Q Onset 500 ms Final    QRSD Interval 97 ms Final    QT Interval 437 ms Final    QTcB 448 ms Final    QTcF 444 ms Final    QRS Horizontal Axis -19 deg Final    QRS Axis 47 deg Final    I-40 Front Axis 30 deg Final    t-40 Horizontal Axis -32 deg Final    T-40 Front Axis 51 deg Final    T Horizontal Axis 78 deg Final    T Wave Axis -12 deg Final    S-T Horizontal Axis 111 deg Final    S-T Front Axis 240 deg Final    Impression - ABNORMAL ECG -  Final    Impression SR-Sinus rhythm-normal P axis, V-rate 50-99  Final    Impression 1AVB-Prolonged PR interval-PR >220, V-rate 50-90  Final    Impression   Final     LVHSR-Consider left ventricular hypertrophy-(S V1+R V5/V6) >3.64mV    Impression   Final     T0IN-Borderline T abnormalities, inferior leads-T flat/neg, II III aVF    Impression   Final     CINJA-ST elevation, consider anterior injury-ST >0.25mV, V1-V5    Impression -no change-  Final     Medications ordered/given in the ED  Medications - No data to display      Electronically signed by Baldo Ash, MD at 11/02/2022 10:32 AM EDT    Associated attestation - Baldo Ash, MD - 11/02/2022 10:32 AM  EDT  Formatting of this note is different from the original.  ED Resident Supervision Note    I have seen and evaluated [name] and agree with the provider?s findings (exceptions, if any, noted below).  I reviewed and directed the Emergency Department treatment given to [name]     A/P:  Tinnitus, ESRD on HD (MWF), Disequilibrium syndrome  -I agree with this provider's history, physical and medical decision-making and supervised patient's care while in the ED.    S:  72 y.o. male  with a chief complaint of RINGING IN EAR and GAIT PROBLEM    O:  No data found.

## 2022-10-08 NOTE — ED Notes (Signed)
Formatting of this note might be different from the original.  Pain assessment on discharge was zero.  Condition stable.  Patient discharged to home.  Patient education was completed:  yes  Education taught to:  patient  Teaching method used was discussion.  Understanding of teaching was good.  Patient was discharged via wheelchair.  Discharged with cab.  Valuables were given to: patient.   Electronically signed by Malva Limes, RN at 10/08/2022  3:12 AM EST

## 2022-10-11 ENCOUNTER — Encounter: Payer: MEDICARE | Primary: Internal Medicine

## 2022-10-13 ENCOUNTER — Inpatient Hospital Stay: Admit: 2022-10-13 | Payer: MEDICARE | Primary: Internal Medicine

## 2022-10-13 NOTE — Progress Notes (Signed)
PHYSICAL / OCCUPATIONAL THERAPY - DAILY TREATMENT NOTE (updated 1/23)    Patient Name: Hunter Higgins    Date: 10/13/2022    DOB: 1950-10-02  Insurance: Payor: UHC MEDICARE / Plan: UHC AARP MEDICARE ADVANTAGE / Product Type: *No Product type* /      Patient DOB verified yes     Visit #   Current / Total 4 10 Total Time   Time   In / Out 12:20 pm 1:20 pm 60   Pain   In / Out 7/10 5/10    Subjective Functional Status/Changes: Patient notes a few stumbles but no falls.       TREATMENT AREA =  Other abnormalities of gait and mobility [R26.89]  Other low back pain [M54.59]    OBJECTIVE    Modalities Rationale:     decrease pain and improve tissue extensibility to improve patient's ability to progress to PLOF and address remaining functional goals.             -  10 min   '[]'$   Ice     '[x]'$   Heat    location/position: Low back, semi-reclined with legs elevated on wedge    Skin assessment post-treatment (if applicable):    '[x]'$   intact    '[]'$   redness- no adverse reaction                 '[]'$ redness - adverse reaction:           Therapeutic Procedures:  50  Total 38  Total MC BC Totals Reminder: bill using total billable min of TIMED therapeutic procedures (example: do not include dry needle or estim unattended, both untimed codes, in totals to left)  8-22 min = 1 unit; 23-37 min = 2 units; 38-52 min = 3 units; 53-67 min = 4 units; 68-82 min = 5 units   Tx Min Billable or 1:1 Min (if diff from Tx Min) Procedure, Rationale, Specifics   50 [id number] Therapeutic Exercise (timed):  increase ROM, strength, coordination, balance, and proprioception to improve patient's ability to progress to PLOF and address remaining functional goals. (see flow sheet as applicable)   Details if applicable:  reassessment           '[x]'$   Patient Education billed concurrently with other procedures   '[x]'$  Review HEP    '[]'$  Progressed/Changed HEP, detail:    '[]'$  Other detail:       Objective Information/Functional Measures/Assessment  HEP compliance: none,  patient notes many family tragedies affecting compliance    Improvements: increased steadiness with walking, some strength, walking longer  Deficits: single leg balance, stairs      Posture: '[x]'$  Poor      Description: thoracic kyhposis, cervical protraction      Gait:  '[x]'$  Abnormal    Device: does not utilize an assistive device      Description: mild/mod sway with static stance, decreased TKE, decreased step length and foot clearance, upper thoracic kyhposis     Strength (MMT): measured in standard position                           Hip L (1-5) R (1-5)   Hip Flexion 4+/5 4+/5   Hip Ext <3/5 bilaterally due to limited ROM    (L) 0 deg, (R) 3 deg   Hip ABD <3/5 bilaterally, unable to lift without trunk and quad compensation        Knee L (1-5)  R (1-5)   Knee Flexion 4+/5 4+/5   Knee Extension 4+/5 4+/5   Ankle DF 4+/5 4+/5      Trunk AROM  Flexion fingertips to mid-shin, extension 50% AROM, (B) SB fingertips to knee joint, rotation (R) 75 % (L) 75 %     Balance/ Equilibrium:          Sitting Balance: Static:  '[x]'$  Fair    '[]'$  Poor         Standing Balance: Static:   '[x]'$  Fair                                     Dynamic:   '[x]'$  Poor     ROM EO: 30"  ROM EC: 30"  MSR (at arch): (L) posterior 30", (R) posterior 30"  Single Leg Stance / SR:  <2" (B)     mCTSIB: 3/4; foam EC ROM 14 seconds                   Functional Mobility Status      Transfers:       Sit-Stand: able to perform independently, mild genu valgus on the R        Stairs: 1 railing assist on the R, reciprocal gait, decreased eccentric control for descent      Other:       Impaired Vision:                  '[x]'$  Y        Impaired hearing in L ear        Safety Awareness Deficits  '[x]'$  Y        Cognition Deficit :  frequently repeats self; difficulty to redirect conversation at times     Optional Tests:       5 Times Sit to Stand: 12 seconds  with no UE assist       2 min walk test (2MWT): 350 feet (I)    Patient will continue to benefit from skilled PT / OT  services to modify and progress therapeutic interventions, analyze and address functional mobility deficits, analyze and address ROM deficits, analyze and address strength deficits, analyze and address soft tissue restrictions, analyze and cue for proper movement patterns, analyze and modify for postural abnormalities, analyze and address imbalance/dizziness, and instruct in home and community integration to address functional deficits and attain remaining goals.    Progress toward goals / Updated goals:  '[x]'$   See Progress Note/Recertification  1. Patient will be independent with progressive HEP to facilitate/maintain gains made in PT upon discharge   Status at PN: established hip strengthening HEP  Current: goal not met; pt notes poor compliance  2.  Patient will be able to ambulate at least 10 minutes with LRAD and no LOB to indicate improved mobility  Status at PN: 2MWT 290 feet with c/o knee stiffness and pain, independent gait, mild unsteadiness with turning  Currrent: goal progressing; 350 feet until fatigued for two minutes   3.  Patient will be able to maintain tandem balance bilaterally, with eyes open, on firm surface for at least 15 seconds to indicate improved stability in narrow BOS   Status at PN:  MSR at arch on floor, EO for > 12 seconds  Current: progressing, MSR at arch 30" B; tandem balance 2 seconds (B)  4.  Patient will restore trunk flexion range of motion  as shown by fingertips to mid-shin with no more than 3/10 pain rating and without AD for functional carryover.  Status at PN: flexion to mid shin; increased pain; able to demo' functional and fair safety with squat/bending to floor  Current: goal met; fingertips to mid-shin  Next PN/ RC due 09/09/22  Auth due NAR    PLAN  '[x]'$  Continue plan of care  '[x]'$   Upgrade activities as tolerated  '[]'$   Discharge due to :  '[x]'$   Other: see PN; cont 1x4; HEP printed with instruction to promote performance daily    Roanna Raider, PTA    10/13/2022    12:33  PM    Future Appointments   Date Time Provider [place]   10/14/2022  1:15 PM Louann Sjogren, MD [facility]   10/18/2022  9:30 AM Teressa Lower, MD [facility]   11/03/2022 10:30 AM Charm Barges, MD [facility]   12/29/2022  8:30 AM Louann Sjogren, MD [facility]

## 2022-10-13 NOTE — Progress Notes (Signed)
[facility] AT [facility]   [address], [place], [place] [address]  Phone: [phone] Fax [phone]  DISCHARGE SUMMARY  Patient Name: Hunter Higgins DOB: Dec 16, 1950   Treatment/Medical Diagnosis: Other abnormalities of gait and mobility [R26.89]  Other low back pain [M54.59]   Referral Source: Erven Colla,*     Date of Initial Visit: 08/09/22 Attended Visits: 9 Missed Visits: 3     SUMMARY OF TREATMENT  Patient is a 72 year old male presenting to PT with complaints of impaired balance and low back pain.  Treatment has consisted of Nmred, TE, TA, functional mobility, HEP, pt education    CURRENT STATUS  The pt attended skilled therapy inconsistently for 9 sessions. He demonstrates little improvement during his time in therapy in terms of his mobility levels or pain levels. He did demo improved static balance and improved LE strength. Pt to be discharged at this time per compliance policy.    1. Patient will be independent with progressive HEP to facilitate/maintain gains made in PT upon discharge   Status at PN: established hip strengthening HEP  Current: goal not met; pt notes poor compliance  2.  Patient will be able to ambulate at least 10 minutes with LRAD and no LOB to indicate improved mobility  Status at PN: 290 feet with c/o knee stiffness and pain, independent gait, mild unsteadiness with turning  Currrent: goal progressing; 350 feet until fatigued for two minutes   3.  Patient will be able to maintain tandem balance bilaterally, with eyes open, on firm surface for at least 15 seconds to indicate improved stability in narrow BOS   Status at PN:  MSR at arch on floor, EO for > 12 seconds  Current: progressing, MSR at arch 30" B; tandem balance 2 seconds (B)  4.  Patient will restore trunk flexion range of motion as shown by fingertips to mid-shin with no more than 3/10 pain rating and without AD for functional carryover.  Status at PN:  flexion to mid shin; increased pain; able to demo' functional and fair safety with squat/bending to floor  Current: goal met; fingertips to mid-shin    RECOMMENDATIONS  Discharge per compliance policy.    If you have any questions/comments please contact us directly at [phone].   Thank you for allowing [place] to assist in the care of your patient.  Therapist Signature: Elissa Lovett, PT Date: 11/21/22   Reporting Period: 09/17/22-10/13/22 Time: 7:24 AM

## 2022-10-13 NOTE — Progress Notes (Signed)
[facility]   [address] [place] [address]  Phone: [phone] Fax: [phone]  PROGRESS NOTE  Patient Name: Hunter Higgins DOB: October 01, 1950   Treatment/Medical Diagnosis: Other abnormalities of gait and mobility [R26.89]  Other low back pain [M54.59]   Referral Source:  Payor Louann Sjogren,*  Payor: [facility] MEDICARE / Plan: [facility] [employer] MEDICARE ADVANTAGE / Product Type: *No Product type* /      Date of Initial Visit: 08/09/22 Attended Visits: 9 Missed Visits: 3     SUMMARY OF TREATMENT  Patient is a 72 year old male presenting to PT with complaints of impaired balance and low back pain.  Treatment has consisted of Nmred, TE, TA, functional mobility, HEP, pt education      CURRENT STATUS  Patient has attended 9 out of 12 sessions from 08/09/22-10/13/22, making slow progress at this time, likely due to difficulty attending treatment consistently and poor reported home exercise compliance. Patient does demonstrate improved LE strength and static balance in modified tandem stance. The patient also reports improved steadiness when performing ADLs and walking between rooms at home. C/c regarding decrease single leg balance and must hold onto supportive surfaces when transferring into bathtub. Assessment as follows:    HEP compliance: none, patient notes many family tragedies affecting compliance     Improvements: increased steadiness with walking, some strength, walking longer  Deficits: single leg balance, stairs     Posture: '[x]'$  Poor      Description: thoracic kyhposis, cervical protraction      Gait:  '[x]'$  Abnormal    Device: does not utilize an assistive device      Description: mild/mod sway with static stance, decreased TKE, decreased step length and foot clearance, upper thoracic kyhposis     Strength (MMT): measured in standard position                           Hip L (1-5) R (1-5)   Hip Flexion 4+/5 4+/5   Hip Ext <3/5 bilaterally due to  limited ROM     (L) 0 deg, (R) 3 deg   Hip ABD <3/5 bilaterally, unable to lift without trunk and quad compensation         Knee L (1-5) R (1-5)   Knee Flexion 4+/5 4+/5   Knee Extension 4+/5 4+/5   Ankle DF 4+/5 4+/5      Trunk AROM  Flexion fingertips to mid-shin, extension 50% AROM, (B) SB fingertips to knee joint, rotation (R) 75 % (L) 75 %     Balance/ Equilibrium:          Sitting Balance: Static:  '[x]'$  Fair    '[]'$  Poor         Standing Balance: Static:   '[x]'$  Fair                                     Dynamic:   '[x]'$  Poor     ROM EO: 30"  ROM EC: 30"  MSR (at arch): (L) posterior 30", (R) posterior 30"  Single Leg Stance / SR:  <2" (B)     mCTSIB: 3/4; foam EC ROM 14 seconds                   Functional Mobility Status  Transfers:       Sit-Stand: able to perform independently, mild genu valgus on the R        Stairs: 1 railing assist on the R, reciprocal gait, decreased eccentric control for descent      Other:       Impaired Vision:                  '[x]'$  Y        Impaired hearing in L ear        Safety Awareness Deficits  '[x]'$  Y        Cognition Deficit :  frequently repeats self; difficulty to redirect conversation at times     Optional Tests:       5 Times Sit to Stand: 12 seconds  with no UE assist       2 min walk test (2MWT): 350 feet (I)    Progress toward goals / Updated goals:  '[x]'$   See Progress Note/Recertification  1. Patient will be independent with progressive HEP to facilitate/maintain gains made in PT upon discharge   Status at PN: established hip strengthening HEP  Current: goal not met; pt notes poor compliance  2.  Patient will be able to ambulate at least 10 minutes with LRAD and no LOB to indicate improved mobility  Status at PN: 2MWT 290 feet with c/o knee stiffness and pain, independent gait, mild unsteadiness with turning  Currrent: goal progressing; 350 feet until fatigued for two minutes   3.  Patient will be able to maintain tandem balance bilaterally, with eyes open, on firm surface  for at least 15 seconds to indicate improved stability in narrow BOS   Status at PN:  MSR at arch on floor, EO for > 12 seconds  Current: progressing, MSR at arch 30" B; tandem balance 2 seconds (B)  4.  Patient will restore trunk flexion range of motion as shown by fingertips to mid-shin with no more than 3/10 pain rating and without AD for functional carryover.  Status at PN: flexion to mid shin; increased pain; able to demo' functional and fair safety with squat/bending to floor  Current: goal met; fingertips to mid-shin    Reporting Period: (date from last Prog Note/Eval to current Prog Note/Recert)  [id number] - [id number]    RECOMMENDATIONS  Continue therapy per initial Plan of Care or most recent Medicare Recert.      If you have any questions/comments please contact us directly.  Thank you for allowing [place] to assist in the care of your patient.    Roanna Raider, PTA       10/13/2022       3:35 PM  Leisa Lenz, PT

## 2022-10-14 ENCOUNTER — Ambulatory Visit: Admit: 2022-10-14 | Discharge: 2022-10-14 | Payer: MEDICARE | Attending: Internal Medicine | Primary: Internal Medicine

## 2022-10-14 DIAGNOSIS — H04129 Dry eye syndrome of unspecified lacrimal gland: Secondary | ICD-10-CM

## 2022-10-14 MED ORDER — ARTIFICIAL TEARS 0.1-0.3 % OP SOLN
0.1-0.3 % | OPHTHALMIC | 1 refills | Status: AC | PRN
Start: 2022-10-14 — End: 2023-06-20

## 2022-10-14 NOTE — Progress Notes (Signed)
Subjective:      Patient ID: Hunter Higgins is a 72 y.o. male.    Acute visit    Patient thinks he scratched his right eye while sleeping, no pain, no vision changes, no eye redness or discharge. Eye seems dry.    Tear drops. If no improvement in 48 hr, then consider ref to ophthalmology.  Pt agreed.  Discussed red flags, if any, will go to ED.  Ref to surgery again for lipoma of neck that increased in size. Patient did not contact surgery last time.        GERD  famotidine (PEPCID) 20 MG tablet, twice daily as needed    Diabetes mellitus type 2  Dulaglutide (TRULICITY) [id number] [id number] SOPN, weekly.    Diabetic neuropathy  gabapentin (NEURONTIN) 300 MG capsule, Take 1 capsule by mouth 3 times daily.    ESRD  On dialysis 3 times a day.    OTC  aspirin 81 MG chewable tablet.    Eye Pain   Pertinent negatives include no fever.   Fall  Pertinent negatives include no abdominal pain or fever.   Shortness of Breath  Pertinent negatives include no abdominal pain, chest pain or fever.       Review of Systems   Constitutional:  Negative for fever.   Eyes:  Negative for pain.   Respiratory:  Negative for shortness of breath.    Cardiovascular:  Negative for chest pain.   Gastrointestinal:  Negative for abdominal pain.       Objective:   Visit Vitals  BP (!) 113/59 (Site: Right Upper Arm, Position: Sitting, Cuff Size: Medium Adult)   Pulse 71   Temp 97 F (36.1 C) (Temporal)   Resp 14   Ht 1.702 m ('5\' 7"'$ )   Wt 76.7 kg (169 lb)   SpO2 100%   BMI 26.47 kg/m        Physical Exam  Eyes:      General: Lids are normal.         Right eye: No foreign body or discharge.         Left eye: No foreign body or discharge.      Extraocular Movements:      Right eye: Normal extraocular motion.      Left eye: Normal extraocular motion.      Conjunctiva/sclera:      Right eye: Right conjunctiva is not injected.      Left eye: Left conjunctiva is not injected.      Pupils: Pupils are equal, round, and reactive to light.      Comments: Both eye  are unremarkable.   Cardiovascular:      Rate and Rhythm: Normal rate and regular rhythm.   Pulmonary:      Effort: Pulmonary effort is normal.      Breath sounds: Normal breath sounds.   Abdominal:      General: Abdomen is flat. Bowel sounds are normal.   Neurological:      Mental Status: He is alert.         Assessment:       ICD-10-CM    1. Dry eye  H04.129 dextran 70-hypromellose (ARTIFICIAL TEARS) 0.1-0.3 % SOLN opthalmic solution     External Referral To Ophthalmology      2. Lipoma of neck  D17.0 [facility] - Britt Bolognese, MD, General Surgery, [facility]:   Return as scheduled.[name]  Louann Sjogren, MD

## 2022-10-14 NOTE — Progress Notes (Signed)
"  Have you been to the ER, urgent care clinic since your last visit?  Hospitalized since your last visit?"    YES - When: approximately 7 days ago.  Where and Why: [facility] for ringing in ear and Gait problem.    "Have you seen or consulted any other health care providers outside of [facility] since your last visit?"    NO    "Have you had a colorectal cancer screening such as a colonoscopy/FIT/Cologuard?    NO

## 2022-10-18 ENCOUNTER — Encounter: Payer: MEDICARE | Attending: Foot and Ankle Surgery | Primary: Internal Medicine

## 2022-10-18 ENCOUNTER — Inpatient Hospital Stay: Payer: MEDICARE | Primary: Internal Medicine

## 2022-10-18 NOTE — Telephone Encounter (Signed)
Patient called to request a prescription and DME order.    Prescription : glucose monitoring kit       DME Order : [facility] request

## 2022-10-25 ENCOUNTER — Encounter: Payer: MEDICARE | Primary: Internal Medicine

## 2022-10-25 ENCOUNTER — Encounter: Payer: MEDICARE | Attending: Surgery | Primary: Internal Medicine

## 2022-11-01 ENCOUNTER — Encounter: Payer: MEDICARE | Primary: Internal Medicine

## 2022-11-03 ENCOUNTER — Encounter: Payer: MEDICARE | Attending: Physical Medicine & Rehabilitation | Primary: Internal Medicine

## 2022-11-08 ENCOUNTER — Encounter: Payer: MEDICARE | Primary: Internal Medicine

## 2022-11-08 ENCOUNTER — Encounter: Payer: MEDICARE | Attending: Surgery | Primary: Internal Medicine

## 2022-11-18 NOTE — Telephone Encounter (Signed)
-----   Message from Virgina Jock, [place] sent at 11/17/2022  3:58 PM EDT -----  Subject: Referral Request    Reason for referral request? Patient needs a new referral sent to Dr.   Howell Rucks at [facility] in [facility]. PH: [phone]  Provider patient wants to be referred to(if known):     Provider Phone Number(if known):    Additional Information for Provider?   ---------------------------------------------------------------------------  --------------  Cleotis Lema INFO    [phone]; OK to leave message on voicemail  ---------------------------------------------------------------------------  --------------

## 2022-11-22 ENCOUNTER — Encounter

## 2022-11-22 NOTE — Telephone Encounter (Signed)
Called patient, Verified 2 patient identifiers. Patient was informed referral to podiatry has been faxed to Dr. Stanton Kidney lee's office at [phone]. Patient was provided the office's number so he can call and schedule. Patient verbalized understanding and expressed no concerns.  He just would like to be scheduled for a sooner appt with Dr. Virgel Manifold than his 12/29/2022, 3 month follow up appt If possible Thank you in advance.

## 2022-11-22 NOTE — Telephone Encounter (Addendum)
Podiatry Referral was faxed to Dr. Stanton Kidney lee's office today. [phone]

## 2022-11-22 NOTE — Telephone Encounter (Signed)
Received call from [facility] spoke with Tonya. Patient complaining of right leg pain and foot swelling. He stated he hit his foot on the corner of the table 2 weeks, that's when the foot started swelling. Patient wants a referral sent to Dr. Carlena Sax office at [facility] in [facility]

## 2022-11-28 NOTE — Telephone Encounter (Signed)
error 

## 2022-11-28 NOTE — Telephone Encounter (Signed)
Pt called requesting to speak to [facility]. Was able to schedule patient earlier then the 12/29/22 appt. However, he still wanted to speak to [facility].

## 2022-12-01 ENCOUNTER — Ambulatory Visit: Admit: 2022-12-01 | Discharge: 2022-12-01 | Payer: MEDICARE | Attending: Internal Medicine | Primary: Internal Medicine

## 2022-12-01 DIAGNOSIS — L089 Local infection of the skin and subcutaneous tissue, unspecified: Secondary | ICD-10-CM

## 2022-12-01 NOTE — Progress Notes (Signed)
"  Have you been to the ER, urgent care clinic since your last visit?  Hospitalized since your last visit?"    NO    "Have you seen or consulted any other health care providers outside of [facility] since your last visit?"    NO        "Have you had a colorectal cancer screening such as a colonoscopy/FIT/Cologuard?    NO    No colonoscopy on file  No cologuard on file  No FIT/FOBT on file   No flexible sigmoidoscopy on file         Click Here for Release of Records Request

## 2022-12-01 NOTE — Progress Notes (Addendum)
Subjective:      Patient ID: Hunter Higgins is a 72 y.o. male.    Acute visit    In the last appt patient had a small callous in the left foot sole, he has been waiting for his appt with a new podiatrist. In the past, patient was seen Dr. Dalbert Garnet, podiatrist. Patient saw his podiatrist 1 year ago did not go back for follows up. He has another appt with [facility] podiatry in [place] next month. Last A1c 6.8 on 09/2022. I do not believe he can wait to see a podiatrist. His small callous has progressed to a open ulcer about 2 inch diameter and new finding of pale discoloration of the left great toe that is very concerning. I sent the patient to the ED for evaluation of complicated left diabetic foot. I anticipate he will be admitted. He should follow up with me after discharge.  He denies fever chills or sweats.    Patient prefers to go back home and bring his god-daughter with him.      Physical deconditioning  Recommended PT OT, SN, Home health.    GERD  famotidine (PEPCID) 20 MG tablet, twice daily as needed    Diabetes mellitus type 2  Dulaglutide (TRULICITY) 0.75 MG/0.5ML SOPN, weekly.    Diabetic neuropathy  gabapentin (NEURONTIN) 300 MG capsule, Take 1 capsule by mouth 3 times daily.    ESRD  On dialysis 3 times a day.    OTC  aspirin 81 MG chewable tablet.    Diabetes  Pertinent negatives for diabetes include no chest pain.   Eye Pain   Pertinent negatives include no fever.   Fall  Pertinent negatives include no abdominal pain or fever.   Shortness of Breath  Pertinent negatives include no abdominal pain, chest pain or fever.       Review of Systems   Constitutional:  Negative for fever.   Eyes:  Negative for pain.   Respiratory:  Negative for shortness of breath.    Cardiovascular:  Negative for chest pain.   Gastrointestinal:  Negative for abdominal pain.       Objective:   Visit Vitals  BP (!) 151/80 (Site: Left Upper Arm, Position: Sitting, Cuff Size: Medium Adult)   Pulse 74   Temp 97.4 F (36.3 C)  (Temporal)   Resp 14   Ht 1.702 m (5\' 7" )   Wt 77.4 kg (170 lb 9.6 oz)   SpO2 99%   BMI 26.72 kg/m        Physical Exam  Cardiovascular:      Rate and Rhythm: Normal rate and regular rhythm.   Pulmonary:      Effort: Pulmonary effort is normal.      Breath sounds: Normal breath sounds.   Musculoskeletal:        Feet:    Feet:      Comments: Open plantar ulcer 2 inches diameter.  Pale discoloration of the left great toe.  Multiple onychomycosis bilaterally.  Small callus adjacent to right hallux valgus in the right foot.  No ulcers noted in the right foot.  Both feet are dry xerotic.  Pedal pulses difficult to palpate.  Unable to check capillary refill due to onychomycosis.  Neurological:      Mental Status: He is alert.         Assessment:       ICD-10-CM    1. Type 2 diabetes mellitus with left diabetic foot infection (HCC)  E11.628  L08.9       2. Type 2 diabetes mellitus with left diabetic foot ulcer (HCC)  E11.621     L97.529       3. Left eye complaint  H57.9 External Referral To Ophthalmology      4. Physical deconditioning  R53.81               Plan:   Return in about 6 weeks (around 01/12/2023) for Follow up foot wounds and diabetes. Last A1c 6.8.[name]      Erven Colla, MD

## 2022-12-02 LAB — HEMOGLOBIN A1C
Estimated Avg Glucose, External: 167 mg/dL — ABNORMAL HIGH (ref 91–123)
Hemoglobin A1C, External: 7.4 % — ABNORMAL HIGH (ref 4.8–5.6)

## 2022-12-09 NOTE — Telephone Encounter (Addendum)
Spoke with patient in regards to podiatry referral. Verified 2 patient identifiers. Patient is currently admitted in the hospital right now for diabetic foot ulcer. He's been there since 12/01/2022. He stated he called Dr. Carlena Sax office, [facility] for an appt but the soonest they can get him in was the end of May. He stated he previously went to [facility] a while back. He had a bad experience with them but he doesn't mind going to them again since it's closer to him than Dr. Carlena Sax  office which is located in [place]. Patient verbalized understanding and expressed no further concerns.  Patient has 2 appointments Scheduled for the same reason. He stated he would like to keep the 01/03/2023 appt and he wants to cancel the 01/13/2023 appt. He'll be out of town during that time.

## 2022-12-12 NOTE — Progress Notes (Signed)
[facility], [facility] Division of Hospital Medicine    Patient Name:  Hunter Higgins    Admit Date:      12/01/2022    Assessment and Plan     Barriers to discharge: None  Expected discharge date: April 29  Disposition: SNF.    Discussed with patient regarding his interaction with the nursing staff yesterday with which he seems to be very unhappy and emotional.  Offered that he should and can talk to patient care advocate today.  His insurance authorization for placement to SNF has been obtained with a plan to transition him to SNF after completion of dialysis today.  Patient expresses a desire to be able to stop by at his apartment to pay his rent and check on his belongings prior to getting admitted to the SNF which was discussed with the case manager by myself, at this point we are not sure if we can make this happen.      This note was dictated using Dragon dictation system.  Although every effort made to ensure accuracy, occasional spelling errors and word substitutions are expected due to dictation limitations  Thornell Sartorius MD.    From 8am to 6pm page me directly for patient related questions.    After 6pm through 8am call on call service for patient related questions.      Chief complaint:     No pain in the left foot              Patient Vitals for the past 8 hrs:   BP Temp Pulse Resp SpO2   12/12/22 0737 179/81 97.4 F (36.3 C) 71 16 93 %           Intake/Output Summary (Last 24 hours) at 12/12/2022 1335  Last data filed at 12/12/2022 1300  Gross per 24 hour   Intake 237 ml   Output --   Net 237 ml       Appearance : No distress, well built.  Left foot covered with a clean and dry Ace wrap      Data Review:    No results found for this visit on 12/01/22 (from the past 24 hour(s)).            Memory Argue, MD MD

## 2022-12-19 NOTE — Telephone Encounter (Signed)
Patient is requesting an order to be sent in to [facility] for treatment at home with his foot please initiate and send order when available [phone]

## 2022-12-20 NOTE — Telephone Encounter (Signed)
Pt inquiring about order to be sent [facility].    PT also inquired about any meal assistance he could get information on.

## 2022-12-21 NOTE — Telephone Encounter (Signed)
Pt is calling inquiring again about the home health order.    He is requesting Graylin Shiver give him a call at her earliest convenience please.

## 2022-12-26 NOTE — Telephone Encounter (Addendum)
Due to pt having dialysis, Irving Burton stated they are only able to see PT 3 times a week. [facility] wants to know if she can send in a new order for wound care for just the 3x a week.     Honey alginate, dry sterile dressing, rolled gauze and ace wrap for 3x a week.     PT also need an order for glucose monitoring kit [[id number]]    Order Details  Dose, Route, Frequency: As Directed   Dispense Quantity: 1 kit Refills: 0    Note to Pharmacy: ALTERNATIVES ALLOWED.         Sig: To check blood sugars 3 times a day and as needed.  Diagnosis: Diabetes mellitus type 2. ALTERNATIVES ALLOWED.   Patient not taking: Reported on 12/01/2022     [facility] [address] [place] - [address] - [place] [phone] - F [phone]

## 2022-12-28 ENCOUNTER — Encounter

## 2022-12-29 ENCOUNTER — Ambulatory Visit: Payer: MEDICARE | Attending: Internal Medicine | Primary: Internal Medicine

## 2023-01-03 ENCOUNTER — Ambulatory Visit: Admit: 2023-01-03 | Discharge: 2023-01-03 | Payer: MEDICARE | Attending: Internal Medicine | Primary: Internal Medicine

## 2023-01-03 DIAGNOSIS — E11628 Type 2 diabetes mellitus with other skin complications: Secondary | ICD-10-CM

## 2023-01-03 LAB — AMB POC HEMOGLOBIN A1C: Hemoglobin A1C, POC: 6.3 %

## 2023-01-03 MED ORDER — BLOOD GLUCOSE TEST VI STRP
ORAL_STRIP | 3 refills | Status: AC
Start: 2023-01-03 — End: ?

## 2023-01-03 MED ORDER — GLUCOSE MONITORING KIT
PACK | Freq: Every day | 0 refills | Status: DC
Start: 2023-01-03 — End: 2024-04-23

## 2023-01-03 MED ORDER — NIFEDIPINE ER OSMOTIC RELEASE 30 MG PO TB24
30 | ORAL_TABLET | Freq: Every day | ORAL | 1 refills | Status: DC
Start: 2023-01-03 — End: 2023-03-16

## 2023-01-03 NOTE — Patient Instructions (Signed)
Please ask dialysis center how is your blood pressure before and after dialysis each time and bring those reading to your next appt with PCP.

## 2023-01-03 NOTE — Progress Notes (Addendum)
Subjective:      Patient ID: Hunter Higgins is a 72 y.o. male.    Hospital follow-up    Patient was seen in the hospital last month after I sent him to the emergency due to the left foot wound concerning for left diabetic foot.  According to hospital records, patient was diagnosed with a left foot plantar ulceration.  He started vancomycin IV for concerns of osteomyelitis. Left foot osteomyelitis was ruled out and antibiotics vancomycin IV was discontinued, he was recommended to continue with cefepime.  Seen by podiatrist.  Currently following up with podiatrist and wound care.  He will have angiogram for concerns for PAD and will follow-up with vascular as well.  He was started nifedipine 90 mg XL daily for high blood pressures.  He believes his blood pressure is controlled before and after dialysis.  He is not sure.    Patient will record his blood pressure before and after dialysis and will bring numbers to next appointment.  Can continue nifedipine XL 30 mg daily for now.  Will check A1c POC for diabetes management.  Continue to follow-up with podiatrist and vascular.  As well as wound care.  FU 3 weeks BP.      Physical deconditioning  Recommended PT OT, SN, Home health.  Wound care as follows: foot injury 3 times a week (since patient has dialysis sessions)    Left foot wounds/bilateral shin skin injury  Following up with podiatry and wound care.    GERD  famotidine (PEPCID) 20 MG tablet, twice daily as needed    Diabetes mellitus type 2  Dulaglutide (TRULICITY) 0.75 MG/0.5ML SOPN, weekly.    Diabetic neuropathy  gabapentin (NEURONTIN) 300 MG capsule, Take 1 capsule by mouth 3 times daily.    ESRD  On dialysis 3 times a day.    OTC  aspirin 81 MG chewable tablet.    Diabetes  Pertinent negatives for diabetes include no chest pain.   Wound Check    Eye Pain   Pertinent negatives include no fever.   Fall  Pertinent negatives include no abdominal pain or fever.   Shortness of Breath  Pertinent negatives include  no abdominal pain, chest pain or fever.       Review of Systems   Constitutional:  Negative for fever.   Eyes:  Negative for pain.   Respiratory:  Negative for shortness of breath.    Cardiovascular:  Negative for chest pain.   Gastrointestinal:  Negative for abdominal pain.       Objective:   Visit Vitals  BP (!) 152/80 (Site: Right Upper Arm, Position: Sitting, Cuff Size: Medium Adult)   Pulse 75   Temp 96.9 F (36.1 C) (Temporal)   Resp 12   Ht 1.702 m (5\' 7" )   Wt 78.2 kg (172 lb 6.4 oz)   SpO2 100%   BMI 27.00 kg/m        Physical Exam  Cardiovascular:      Rate and Rhythm: Normal rate and regular rhythm.   Pulmonary:      Effort: Pulmonary effort is normal.      Breath sounds: Normal breath sounds.   Abdominal:      General: Abdomen is flat. Bowel sounds are normal.   Musculoskeletal:        Legs:         Feet:    Feet:      Comments: Left foot covered with gauzes, no tenderness, swelling both legs.  Neurological:  Mental Status: He is alert.         Assessment:       ICD-10-CM    1. Type 2 diabetes mellitus with left diabetic foot infection (HCC)  E11.628 glucose monitoring kit    L08.9 blood glucose monitor strips     AMB POC HEMOGLOBIN A1C      2. Essential hypertension  I10 NIFEdipine (PROCARDIA XL) 30 MG extended release tablet      3. Physical deconditioning  R53.81       4. Injury of shin, unspecified laterality, sequela  S89.90XS               Plan:   Return in about 3 weeks (around 01/24/2023) for Treatment monitoring BP.      Erven Colla, MD

## 2023-01-03 NOTE — Progress Notes (Signed)
"\"  Have you been to the ER, urgent care clinic since your last visit?  Hospitalized since your last visit?\"    YES - When: approximately 1 months ago.  Where and Why: [facility] general foot ulcer.    \"Have you seen or consulted any other health care providers outside of [facility] since your last visit?\"    NO        \"Have you had a colorectal cancer screening such as a colonoscopy/FIT/Cologuard?    NO    No colonoscopy on file  No cologuard on file  No FIT/FOBT on file   No flexible sigmoidoscopy on file         Click Here for Release of Records Request"

## 2023-01-04 NOTE — Telephone Encounter (Signed)
Colleen with [facility] is calling in stating while out to see the patient today for wound care they noticed the patient has a new wound on his (R) shin that is red, inflamed and hot to the touch.  States she asked the patient  if he showed this to you yesterday and was told NO, he didn't show you because that isn't the problem leg.    Please Amelia Jo - [phone]

## 2023-01-06 ENCOUNTER — Encounter

## 2023-01-06 NOTE — Telephone Encounter (Signed)
Denida stated that the patient needs the Lancets sent into the pharmacy ASAP, meter and strips were received but not Lancets.

## 2023-01-11 ENCOUNTER — Encounter

## 2023-01-12 ENCOUNTER — Encounter

## 2023-01-12 MED ORDER — LANCETS 33G MISC
3 refills | Status: DC
Start: 2023-01-12 — End: 2023-01-27

## 2023-01-13 ENCOUNTER — Ambulatory Visit: Payer: MEDICARE | Attending: Internal Medicine | Primary: Internal Medicine

## 2023-01-17 ENCOUNTER — Ambulatory Visit: Admit: 2023-01-17 | Discharge: 2023-01-17 | Payer: MEDICARE | Attending: Surgery | Primary: Internal Medicine

## 2023-01-17 VITALS — BP 150/67 | HR 74 | Temp 97.00000°F | Ht 67.0 in | Wt 172.0 lb

## 2023-01-17 DIAGNOSIS — R221 Localized swelling, mass and lump, neck: Principal | ICD-10-CM

## 2023-01-17 NOTE — Progress Notes (Signed)
General Surgery Consult    Keigo Schimpf  Admit date: (Not on file)    MRN: [id number]     DOB: Nov 07, 1950     Age: 72 y.o.        Attending Physician: Bradly Chris, MD, FACS      History of Present Illness:      Hunter Higgins is a 72 y.o. male who he was referred to me by Dr. Ramiro Harvest for evaluation of very large posterior neck mass.  The patient has multiple comorbidities and recently is was diagnosed with infection of the left foot with possible osteomyelitis.  In addition he has coronary artery disease diabetes and renal failure on dialysis who has this mass that has been present for more than 10 years and possibly few decades.  He said that is located in the posterior neck and has been increasing in size and now it is huge.  It is not causing any significant pain.     Patient Active Problem List    Diagnosis Date Noted    Dialysis patient [facility])     Foot ulcer (HCC)     Severe protein-calorie malnutrition (HCC) 12/31/2020    NSTEMI (non-ST elevated myocardial infarction) (HCC) 12/23/2020    Skin ulcer of left foot, limited to breakdown of skin (HCC) 11/12/2020    Diabetic foot infection (HCC) 11/12/2020    Hyperglycemia 11/12/2020    Moderate protein-calorie malnutrition (HCC) 11/12/2020    Hyperkalemia 11/12/2020    Charcot's joint of foot, left 11/12/2020    Charcot foot due to diabetes mellitus (HCC) 11/12/2020    Acute renal failure with acute tubular necrosis superimposed on stage 4 chronic kidney disease (HCC) 11/12/2020    Acute diastolic HF (heart failure) (HCC) 11/12/2020    Vascular complication     Visual impairment     Diabetes (HCC)     Sudden hearing loss, left 01/27/2020    Tympanic membrane rupture 01/08/2020    Dyslipidemia 08/19/2019    Optic atrophy of both eyes 06/13/2019    PVD (peripheral vascular disease) (HCC) 05/14/2019    Complete atrioventricular block (HCC) 05/06/2019    Diabetic foot ulcer (HCC) 10/19/2018    Pseudophakia, both eyes 10/04/2018    Stage 3 chronic kidney  disease (HCC) 09/27/2018    CRI (chronic renal insufficiency), stage 4 (severe) (HCC) 09/27/2018    Adrenal insufficiency (HCC) 04/09/2015    Shortness of breath 09/17/2014    Peripheral edema 09/17/2014    Bronchospasm, acute 09/16/2014    Diabetic retinopathy associated with type 2 diabetes mellitus (HCC) 09/13/2014    Abdominal pain 09/13/2014    Esophageal reflux 05/10/2013    Pharyngitis 09/14/2012    Fluid overload 09/03/2012    Ileus following gastrointestinal surgery (HCC) 09/03/2012    Hypokalemia 08/30/2012    Partial small bowel obstruction (HCC) 08/21/2012    Chest pain 08/21/2012    Abnormal EKG 08/21/2012    Vitreous hemorrhage (HCC) 12/15/2011    Status post intraocular lens implant 12/15/2011    Macular atrophy, retinal 12/15/2011    Macular degeneration (senile) of retina 12/15/2011    Lens replaced 12/15/2011    Type 1 diabetes mellitus with proliferative retinopathy of both eyes without macular edema (HCC) 12/05/2011    Essential hypertension 08/19/2008    Regional enteritis of small intestine (HCC) 01/03/2008    Impotence of organic origin 01/03/2008    Uncontrolled type 2 diabetes with retinopathy 07/19/2007    Sarcoidosis 07/19/2007    Bilateral impacted  cerumen 07/19/2007    Urinary incontinence, stress, male 08/17/2006     Past Medical History:   Diagnosis Date    CAD (coronary artery disease)     Diabetes (HCC)     Diabetic foot ulcer (HCC)     Left    Dialysis patient (HCC)     ESRD (end stage renal disease) (HCC)     Foot ulcer (HCC)     Hypertension     Retinopathy     Visual impairment       Past Surgical History:   Procedure Laterality Date    DEBRIDEMENT OPEN WOUND 20 SQ CM< Left     foot    PROSTATE SURGERY        Social History     Tobacco Use    Smoking status: Former     Current packs/day: 0.00     Types: Cigarettes     Start date: 11/04/1967     Quit date: 11/03/1968     Years since quitting: 54.2     Passive exposure: Past    Smokeless tobacco: Never   Substance Use Topics     Alcohol use: Yes      Social History     Tobacco Use   Smoking Status Former    Current packs/day: 0.00    Types: Cigarettes    Start date: 11/04/1967    Quit date: 11/03/1968    Years since quitting: 54.2    Passive exposure: Past   Smokeless Tobacco Never     No family history on file.   Current Outpatient Medications   Medication Sig    Lancets 33G MISC To check glucose level 3 times a day. Dx: DM-II    calcitRIOL (ROCALTROL) 0.25 MCG capsule Take 1 capsule by mouth daily    sevelamer (RENVELA) 800 MG tablet Take 1 tablet by mouth 3 times daily (with meals)    ferrous sulfate (IRON 325) 325 (65 Fe) MG tablet Take 1 tablet by mouth every other day (Patient not taking: Reported on 01/03/2023)    glucose monitoring kit 1 kit by Does not apply route daily Check blood sugars twice a day and as needed. Dx: DM-II    blood glucose monitor strips Test 2 times a day & as needed for symptoms of irregular blood glucose. Dispense sufficient amount for indicated testing frequency plus additional to accommodate PRN testing needs.    NIFEdipine (PROCARDIA XL) 30 MG extended release tablet Take 1 tablet by mouth daily    Multiple Vitamins-Minerals (MULTIVITAMIN MEN 50+ PO) Take by mouth daily    dextran 70-hypromellose (ARTIFICIAL TEARS) 0.1-0.3 % SOLN opthalmic solution Place 1 drop into the right eye every 4-6 hours as needed (dry eye) (Patient not taking: Reported on 01/03/2023)    atorvastatin (LIPITOR) 20 MG tablet Take 1 tablet by mouth nightly    Dulaglutide (TRULICITY) 0.75 MG/0.5ML SOPN Inject 0.75 mg into the skin every 7 days    aspirin 81 MG chewable tablet Take 1 tablet by mouth daily    gabapentin (NEURONTIN) 300 MG capsule Take 1 capsule by mouth in the morning and at bedtime for 180 days. Max Daily Amount: 600 mg     No current facility-administered medications for this visit.      Allergies   Allergen Reactions    Ace Inhibitors Cough, Itching and Other (See Comments)     cough  Other reaction(s): Cough  cough  cough     Naproxen Anaphylaxis, Shortness Of Breath  and Swelling     Other reaction(s): gi distress, Other, Other (See Comments)  Throat swells. cannot breathe, and causes GI distress  REACTION: Throat swells and cannot breathe  anaphylaxis  REACTION: Throat swells and cannot breathe  anaphylaxis  Throat swells. cannot breathe, and causes GI distress          Review of Systems:  Pertinent items are noted in the History of Present Illness.    Objective:     BP (!) 150/67 (Site: Left Lower Arm, Position: Sitting, Cuff Size: Small Adult)   Pulse 74   Temp 97 F (36.1 C) (Temporal)   Ht 1.702 m (5\' 7" )   Wt 78 kg (172 lb)   BMI 26.94 kg/m     Physical Exam:      General:  in no apparent distress, alert, oriented times 3, and afebrile   Eyes:  conjunctivae and sclerae normal, pupils equal, round, reactive to light   Throat & Neck: There is a very large soft tissue mass more than 10 cm located in the posterior neck that is soft nontender and movable consistent with a very large lipoma.   Lungs:   clear to auscultation bilaterally   Heart:  Regular rate and rhythm   Abdomen:   rounded, soft, nontender, nondistended, no masses or organomegaly   Extremities: extremities normal, atraumatic, no cyanosis or edema   Skin: Normal.       Imaging and Lab Review:     CBC:   Lab Results   Component Value Date/Time    WBC 6.1 11/16/2021 11:18 AM    RBC 5.73 11/16/2021 11:18 AM    HGB 12.6 11/16/2021 11:18 AM    HCT 41.7 11/16/2021 11:18 AM    PLT 173 11/16/2021 11:18 AM     BMP:   Lab Results   Component Value Date/Time    NA 136 09/29/2022 08:59 AM    K 4.4 09/29/2022 08:59 AM    CL 98 09/29/2022 08:59 AM    CO2 29 09/29/2022 08:59 AM    BUN 36 09/29/2022 08:59 AM     CMP:  Lab Results   Component Value Date/Time    NA 136 09/29/2022 08:59 AM    K 4.4 09/29/2022 08:59 AM    CL 98 09/29/2022 08:59 AM    CO2 29 09/29/2022 08:59 AM    BUN 36 09/29/2022 08:59 AM    GLOB 4.0 09/29/2022 08:59 AM       No results found for this or any  previous visit (from the past 24 hour(s)).    images and reports reviewed    Assessment:   Hunter Higgins is a 72 y.o. male who is presenting with a soft tissue mass located on the posterior neck that represent most likely a lipoma that has been present for decades and is not causing any significant pain but it is enlarging.  Given the fact that the patient has active osteomyelitis and multiple significant comorbidities including dialysis and coronary artery disease I think it is best to observe it for now though at some point he may benefit from surgical excision.  I told him that it is most likely benign however there is always a possibility of malignancy but based on the exam and imaging studies and how long it has been most likely it is benign so we decided to observe it for now.    Plan:     Continue with antibiotic and treatment of his left foot wound  Medical management  Follow-up with me in 2 to 3 months    Please call me if you have any questions (cell phone: [phone])     Signed By: Bradly Chris, MD     January 17, 2023

## 2023-01-17 NOTE — Progress Notes (Addendum)
Igor Oquin is a 72 y.o. male (DOB: 05/18/1951) presenting to address:    Chief Complaint   Patient presents with    New Patient     Mass of mid upper back/neck/referred by Dr. Janee Morn       Medication list and allergies have been reviewed with [name] and updated as of today's date.     I have gone over all Medical, Surgical and Social History with [name] and updated/added the information accordingly.

## 2023-01-18 NOTE — Telephone Encounter (Signed)
Josie wanted to call and say that they helped the patient set up his glucometer and his blood sugar was 240.    She stated they have advised him to follow a healthy diet plan but he isn't being very compliant.

## 2023-01-19 ENCOUNTER — Encounter: Payer: MEDICARE | Attending: Physical Medicine & Rehabilitation | Primary: Internal Medicine

## 2023-01-23 NOTE — Telephone Encounter (Signed)
Josie wanted to state that after seeing the patient today, he's been noncompliant about taking his medications.    Bp is 166/90 and had a headache. Did not take meds this morning.     Blood sugar was 312. But felt fine. Josie is going to retest later this evening.

## 2023-01-24 ENCOUNTER — Ambulatory Visit: Payer: MEDICARE | Attending: Internal Medicine | Primary: Internal Medicine

## 2023-01-24 NOTE — Telephone Encounter (Signed)
Referrals was sent  

## 2023-01-27 ENCOUNTER — Encounter

## 2023-01-27 NOTE — Telephone Encounter (Signed)
Received call from Josie with [facility] on [facility]. PT be reading was 116/66 then 118/52 later in the day. When the diastolic number is 10 points low the [facility] nurse must contact PCP to notify. Pt BS was 258 and per Nurse pt just took some insulin for it. Nurse states pt need refill on lancets for checking BS.       Last Appointment:  01/03/2023  Future Appointments   Date Time Provider Department Center   02/07/2023  2:00 PM Erven Colla, MD [facility] AMB

## 2023-01-27 NOTE — Telephone Encounter (Signed)
-----   Message from Lyndal Rainbow sent at 01/25/2023  3:07 PM EDT -----  Subject: Message to Provider    QUESTIONS  Information for Provider? Sheralyn Boatman, [facility] seen pt today   after kidney dialysis, very non compliant - not taking meds as directed -   not taking trulicity, not monitoring blood sugar levels, not eating   appropriately or at all. Blood sugar was 385 when tested today. Having   frequent headaches - pt says its from dialysis. Pt advised that dialysis   advised not to take NIFEdipine. Home care was very concerned with his well   being. Please review and reach out. Also please reach out to [facility],   Sheralyn Boatman [phone] as well  ---------------------------------------------------------------------------  --------------  Cleotis Lema INFO  [phone]; OK to leave message on voicemail  ---------------------------------------------------------------------------  --------------  SCRIPT ANSWERS  Relationship to Patient? Covered Entity  Covered Entity Type? Home Health Care?  Representative Name? Sheralyn Boatman, [facility]

## 2023-01-29 MED ORDER — LANCETS 33G MISC
5 refills | Status: AC
Start: 2023-01-29 — End: ?

## 2023-01-29 NOTE — Progress Notes (Signed)
I talked to Sheralyn Boatman [phone] from home health regarding the patient, she informed us patient is not compliant and shows elevated BP and sugars at home. Sheralyn Boatman was informed we are aware patient is not compliant and he has very frequent follows up in our office every month.

## 2023-02-07 ENCOUNTER — Ambulatory Visit: Payer: MEDICARE | Attending: Internal Medicine | Primary: Internal Medicine

## 2023-02-10 ENCOUNTER — Encounter: Payer: MEDICARE | Primary: Internal Medicine

## 2023-02-11 ENCOUNTER — Encounter: Payer: MEDICARE | Primary: Internal Medicine

## 2023-02-13 ENCOUNTER — Encounter: Payer: MEDICARE | Primary: Internal Medicine

## 2023-02-20 ENCOUNTER — Ambulatory Visit: Payer: MEDICARE | Attending: Internal Medicine | Primary: Internal Medicine

## 2023-03-08 NOTE — Telephone Encounter (Signed)
Hunter Higgins with [facility] is calling to let you know they started care with the patient today and his BP was elevated at 190/80, patient had not taken his medication.  They have set him up to help him remember to take his medications.

## 2023-03-08 NOTE — Telephone Encounter (Addendum)
Pt calling to give the name of needles that he needs for his One Touch Ultra 2 Meter 38 G. Pt is needing needles sent pharmacy. Pt also wanted to make Dr Virgel Manifold aware that he see's his surgeon tomorrow for his foot

## 2023-03-09 ENCOUNTER — Encounter

## 2023-03-09 MED ORDER — GLUCOSE BLOOD VI STRP
5 refills | Status: AC
Start: 2023-03-09 — End: ?

## 2023-03-09 MED ORDER — ONETOUCH ULTRASOFT LANCETS MISC
5 refills | Status: AC
Start: 2023-03-09 — End: ?

## 2023-03-09 NOTE — Telephone Encounter (Signed)
Rayfield Citizen from [facility] called on behalf of the pt wanting to inform Dr. Virgel Manifold that the pt has started home health care today. FYI

## 2023-03-09 NOTE — Progress Notes (Signed)
Assessment: Post op partial 1st ray amputation left foot  Diabetes Type 2 with Peripheral Neuropathy/PAD  Wagner Stage 2 Ulcer Lateral 5th MPJ and Heel Left foot    Plan:  1. Sutures removed  2. Procedure: Excisional debridement     Utilizing a #15 blade and curette     Frequency of Debridement: Every 2 to 3 weeks     Measurement of Lesion:              Before: 3.5cm x 4.5cm x 0.1cm left heel, lateral fifth MPJ 1 cm x 1 cm x 0.1 cm                    after: 4 cm x 5 cm x 0.3 cm, lateral fifth MPJ 1.2 cm x 1.3 cm x 0.3 cm     Area and Depth of Devitalized Tissue: Debrided into the subcutaneous layer     Blood Loss and Description of Tissue Removed: Slough and biofilm removed with minimal blood loss     Wound Progress: Initial     Drainage: None  Start Aquacel Ag dressing changes 3 times a week  Patient to return in 3 weeks or sooner if any problems       Subjective: Hunter Higgins is post op 4 weeks.  Patient denies any fever, chills, nausea, vomiting.  Pain is 5/10 on VAS scale.  Patient is ambulating with surgical shoe and walker    Objective:    Physical Exam:       Exam today reveals positive edema left foot.  There is minimal dehiscence at the first ray amputation site left foot.  Does not probe to bone.  No drainage.  There is an ulcer lateral fifth MPJ and posterior left heel.  They both have granular bases with overlying biofilm and slough.  Does not probe to bone.[name]          Jay Schlichter, DPM  03/09/2023  10:54 AM

## 2023-03-10 NOTE — Telephone Encounter (Signed)
Received a call from [place], [facility] care regarding the pt was seen today and two left lateral wounds were noted. This is new. Staff states the pt did see Dr. Trisha Mangle yesterday. Staff was calling to inform of this matter.

## 2023-03-13 NOTE — Telephone Encounter (Signed)
Josie is calling to inform Dr.Chavez that patient had not taken any of his medications this morning 03/13/23 Blood pressure was 180/64.

## 2023-03-15 NOTE — Telephone Encounter (Signed)
Josie called from [facility], 03/10/2023 pt weighed 164 lb and today pt's weight is 166.6 lb. HH nurse have to notify provider of 2 lb increase within one week.

## 2023-03-16 ENCOUNTER — Ambulatory Visit: Admit: 2023-03-16 | Discharge: 2023-03-16 | Payer: MEDICARE | Attending: Internal Medicine | Primary: Internal Medicine

## 2023-03-16 DIAGNOSIS — Z0000 Encounter for general adult medical examination without abnormal findings: Secondary | ICD-10-CM

## 2023-03-16 LAB — AMB POC HEMOGLOBIN A1C: Hemoglobin A1C, POC: 6.2 %

## 2023-03-16 MED ORDER — ATORVASTATIN CALCIUM 40 MG PO TABS
40 MG | ORAL_TABLET | Freq: Every evening | ORAL | 1 refills | Status: AC
Start: 2023-03-16 — End: ?

## 2023-03-16 MED ORDER — TRULICITY 0.75 MG/0.5ML SC SOPN
0.750.5 MG/0.5ML | SUBCUTANEOUS | 5 refills | Status: AC
Start: 2023-03-16 — End: 2023-09-19

## 2023-03-16 MED ORDER — GABAPENTIN 300 MG PO CAPS
300 | ORAL_CAPSULE | Freq: Two times a day (BID) | ORAL | 1 refills | Status: AC
Start: 2023-03-16 — End: 2023-09-12

## 2023-03-16 NOTE — Progress Notes (Addendum)
Subjective:      Patient ID: Hunter Higgins is a 72 y.o. male.    Hospital follow-up    A1c POC 6.2 congratulated, continue Trulicty.  Will increase Lipitor to 40 mg daily.  Has appt with podiatrist in 2 weeks.  Foot healing well, no signs of infection after left great toe amputation.  Patient and instructed to contact his podiatrist and inform the podiatrist that left heel has turned into a stage 2 pressure ulcer.   Weight is stable same as last appt.  Nephrology stopped nifedipine due to concerns of soft blood pressures.  Blood pressure is mildly elevated.  According to the patient is controlled after dialysis sessions.  He denies fever chills or sweats.  No other complaints or concerns.  Patient wants to follow-up in 3 months.      Essential hyypertension  Nephrology stopped nifedipine according to patient due to soft BP.  According to the patient is controlled after dialysis sessions.    Physical deconditioning  Recommended PT OT, SN, Home health.  Wound care as follows: foot injury 3 times a week (since patient has dialysis sessions)    Diabetic foot s/p amputation left foot /bilateral shin skin injury  Following up with podiatry and wound care.    GERD  famotidine (PEPCID) 20 MG tablet, twice daily as needed    Diabetes mellitus type 2  Dulaglutide (TRULICITY) 0.75 MG/0.5ML SOPN, weekly.    Diabetic neuropathy  gabapentin (NEURONTIN) 300 MG capsule, Take 1 capsule by mouth 3 times daily.    ESRD  On dialysis 3 times a day.    OTC  aspirin 81 MG chewable tablet.    Diabetes  Pertinent negatives for diabetes include no chest pain.   Wound Check    Eye Pain   Pertinent negatives include no fever.   Fall  Pertinent negatives include no abdominal pain or fever.   Shortness of Breath  Pertinent negatives include no abdominal pain, chest pain or fever.       Review of Systems   Constitutional:  Negative for fever.   Eyes:  Negative for pain.   Respiratory:  Negative for shortness of breath.    Cardiovascular:   Negative for chest pain.   Gastrointestinal:  Negative for abdominal pain.       Objective:   Visit Vitals  BP (!) 148/64   Pulse 84   Temp 97.1 F (36.2 C) (Temporal)   Resp 16   Ht 1.702 m (5\' 7" )   Wt 78 kg (172 lb)   SpO2 100%   BMI 26.94 kg/m        Physical Exam  Cardiovascular:      Rate and Rhythm: Normal rate and regular rhythm.   Pulmonary:      Effort: Pulmonary effort is normal.      Breath sounds: Normal breath sounds.   Abdominal:      General: Abdomen is flat. Bowel sounds are normal.   Feet:      Comments: Left first toe amputation, healing wound.  Left heel stage 2 pressure ulcer.  Neurological:      Mental Status: He is alert.         Assessment:       ICD-10-CM    1. Medicare annual wellness visit, subsequent  Z00.00       2. ACP (advance care planning)  Z71.89 PR Advanced Care Planning (16-30 minutes) [99497]      3. Hospital discharge follow-up  Z09 PR  DISCHARGE MEDS RECONCILED W/ CURRENT OUTPATIENT MED LIST      4. Type 2 diabetes mellitus with other specified complication, with long-term current use of insulin (HCC)  E11.69 atorvastatin (LIPITOR) 40 MG tablet    Z79.4 AMB POC HEMOGLOBIN A1C      5. Hyperlipidemia, unspecified hyperlipidemia type  E78.5 atorvastatin (LIPITOR) 40 MG tablet      6. Dialysis patient (HCC)  Z99.2       7. Other chronic pain  G89.29 gabapentin (NEURONTIN) 300 MG capsule      8. Essential hypertension  I10       9. Diabetes mellitus due to underlying condition with hyperosmolarity without nonketotic hyperglycemic-hyperosmolar coma [facility]) (HCC)  E08.00 dulaglutide (TRULICITY) 0.75 MG/0.5ML SOPN SC injection      10. Pressure injury of left heel, stage 2 (HCC)  U98.119               Plan:   Return in about 3 months (around 06/16/2023) for Follow up per patient's request..      Erven Colla, MD

## 2023-03-16 NOTE — Progress Notes (Signed)
Medicare Annual Wellness Visit    Hunter Higgins is here for Follow-Up from Hospital (DC on 03/03/23) and Medicare AWV    Assessment & Plan   Hospital discharge follow-up  -     PR DISCHARGE MEDS RECONCILED W/ CURRENT OUTPATIENT MED LIST  Type 2 diabetes mellitus with other specified complication, with long-term current use of insulin (HCC)  -     atorvastatin (LIPITOR) 40 MG tablet; Take 1 tablet by mouth nightly, Disp-90 tablet, R-1Normal  -     AMB POC HEMOGLOBIN A1C  Hyperlipidemia, unspecified hyperlipidemia type  -     atorvastatin (LIPITOR) 40 MG tablet; Take 1 tablet by mouth nightly, Disp-90 tablet, R-1Normal  Dialysis patient [facility])  Other chronic pain  -     gabapentin (NEURONTIN) 300 MG capsule; Take 1 capsule by mouth in the morning and at bedtime for 180 days. Max Daily Amount: 600 mg, Disp-180 capsule, R-1Normal  Essential hypertension  Diabetes mellitus due to underlying condition with hyperosmolarity without nonketotic hyperglycemic-hyperosmolar coma [facility]) (HCC)  -     dulaglutide (TRULICITY) 0.75 MG/0.5ML SOPN SC injection; Inject 0.5 mLs into the skin every 7 days, Disp-5 Adjustable Dose Pre-filled Pen Syringe, R-5Normal  ACP (advance care planning)  -     PR Advanced Care Planning (16-30 minutes) [02725]  Medicare annual wellness visit, subsequent    Recommendations for Preventive Services Due: see orders and patient instructions/AVS.  Recommended screening schedule for the next 5-10 years is provided to the patient in written form: see Patient Instructions/AVS.     Return in about 3 months (around 06/16/2023) for Follow up per patient's request..     Subjective       Patient's complete Health Risk Assessment and screening values have been reviewed and are found in Flowsheets. The following problems were reviewed today and where indicated follow up appointments were made and/or referrals ordered.    Positive Risk Factor Screenings with Interventions:    Fall Risk:  Do you feel unsteady or are you  worried about falling? : no  2 or more falls in past year?: (!) yes  Fall with injury in past year?: no     Interventions:    Reviewed medications, home hazards, visual acuity, and co-morbidities that can increase risk for falls  See AVS for additional education material            General HRA Questions:  Select all that apply: (!) Loneliness  Interventions - Loneliness:  See AVS for additional education material      Inactivity:  On average, how many days per week do you engage in moderate to strenuous exercise (like a brisk walk)?: 0 days (!) Abnormal  On average, how many minutes do you engage in exercise at this level?: 0 min  Interventions:  See AVS for additional education material      Dentist Screen:  Have you seen the dentist within the past year?: (!) No    Intervention:  See AVS for additional education material    Hearing Screen:  Do you or your family notice any trouble with your hearing that hasn't been managed with hearing aids?: (!) Yes    Interventions:  See AVS for additional education material    Vision Screen:  Do you have difficulty driving, watching TV, or doing any of your daily activities because of your eyesight?: (!) Yes  Have you had an eye exam within the past year?: (!) No  Interventions:   See AVS for additional  education material      Advanced Directives:  Do you have a Living Will?: (!) No    Intervention:  has NO advanced directive - information provided    Advance Care Planning   Discussed the patient's choices for care and treatment preferences in case of a health event that adversely affects decision-making abilities or is life-limiting. Recommended the patient document care preferences in state-specific advance directives. Also reviewed the process of designating a trusted capable adult as an Agent (or Health Care Power of Gerrit Friends) to make health care decisions for the patient if the patient becomes unable due to incapacity. Patient was asked to complete advance directive forms,  if not already done, and to provide a dated, signed and witnessed (or notarized) copy, per the forms' requirements, to the practice office.    Time spent (minutes): 16 minutes                    Objective   Vitals:    03/16/23 1259   BP: (!) 148/64   Pulse: 84   Resp: 16   Temp: 97.1 F (36.2 C)   TempSrc: Temporal   SpO2: 100%   Weight: 78 kg (172 lb)   Height: 1.702 m (5\' 7" )      Body mass index is 26.94 kg/m.                    Allergies   Allergen Reactions    Ace Inhibitors Cough, Itching and Other (See Comments)     cough  Other reaction(s): Cough  cough  cough    Naproxen Anaphylaxis, Shortness Of Breath and Swelling     Other reaction(s): gi distress, Other, Other (See Comments)  Throat swells. cannot breathe, and causes GI distress  REACTION: Throat swells and cannot breathe  anaphylaxis  REACTION: Throat swells and cannot breathe  anaphylaxis  Throat swells. cannot breathe, and causes GI distress     Prior to Visit Medications    Medication Sig Taking? Authorizing Provider   atorvastatin (LIPITOR) 40 MG tablet Take 1 tablet by mouth nightly Yes Ramiro Harvest, Helyn App, MD   gabapentin (NEURONTIN) 300 MG capsule Take 1 capsule by mouth in the morning and at bedtime for 180 days. Max Daily Amount: 600 mg Yes Erven Colla, MD   dulaglutide (TRULICITY) 0.75 MG/0.5ML SOPN SC injection Inject 0.5 mLs into the skin every 7 days Yes Ramiro Harvest, Helyn App, MD   blood glucose test strips (ASCENSIA AUTODISC VI;ONE TOUCH ULTRA TEST VI) strip To check sugars/glucose 3 times a day. Dx. DM-II. Yes Erven Colla, MD   ONE TOUCH ULTRASOFT LANCETS MISC To check sugars/glucose 3 times a day. Dx. DM-II. Yes Erven Colla, MD   Lancets 33G MISC To check glucose level 3 times a day. Dx: DM-II Yes Erven Colla, MD   calcitRIOL (ROCALTROL) 0.25 MCG capsule Take 1 capsule by mouth daily Yes [provider]   sevelamer (RENVELA) 800 MG tablet Take 1 tablet by mouth 3 times daily  (with meals) Yes [provider]   ferrous sulfate (IRON 325) 325 (65 Fe) MG tablet Take 1 tablet by mouth every other day Yes [provider]   glucose monitoring kit 1 kit by Does not apply route daily Check blood sugars twice a day and as needed. Dx: DM-II Yes Erven Colla, MD   blood glucose monitor strips Test 2 times a day &  as needed for symptoms of irregular blood glucose. Dispense sufficient amount for indicated testing frequency plus additional to accommodate PRN testing needs. Yes Erven Colla, MD   Multiple Vitamins-Minerals (MULTIVITAMIN MEN 50+ PO) Take by mouth daily Yes [provider]   aspirin 81 MG chewable tablet Take 1 tablet by mouth daily Yes [provider]   dextran 70-hypromellose (ARTIFICIAL TEARS) 0.1-0.3 % SOLN opthalmic solution Place 1 drop into the right eye every 4-6 hours as needed (dry eye)  Patient not taking: Reported on 03/16/2023  Erven Colla, MD       CareTeam (Including outside providers/suppliers regularly involved in providing care):   Patient Care Team:  Ramiro Harvest, Helyn App, MD as PCP - General (Internal Medicine)  Ramiro Harvest, Helyn App, MD as PCP - Empaneled Provider  Rabie, Kaylyn Layer, DPM as Physician  Samson Frederic, MD as Consulting Physician      Reviewed and updated this visit:  Tobacco  Allergies  Meds  Problems  Med Hx  Surg Hx  Soc Hx  Fam Hx

## 2023-03-16 NOTE — Telephone Encounter (Signed)
Josie with HH called back stating she forgot to report that the skin tear on his left heel has turned into a stage 2 pressure ulcer.

## 2023-03-16 NOTE — Patient Instructions (Signed)
Preventing Falls: Care Instructions  Injuries and health problems such as trouble walking or poor eyesight can increase your risk of falling. So can some medicines. But there are things you can do to help prevent falls. You can exercise to get stronger. You can also arrange your home to make it safer.    Talk to your doctor about the medicines you take. Ask if any of them increase the risk of falls and whether they can be changed or stopped.   Try to exercise regularly. It can help improve your strength and balance. This can help lower your risk of falling.         Practice fall safety and prevention.   Wear low-heeled shoes that fit well and give your feet good support. Talk to your doctor if you have foot problems that make this hard.  Carry a cellphone or wear a medical alert device that you can use to call for help.  Use stepladders instead of chairs to reach high objects. Don't climb if you're at risk for falls. Ask for help, if needed.  Wear the correct eyeglasses, if you need them.        Make your home safer.   Remove rugs, cords, clutter, and furniture from walkways.  Keep your house well lit. Use night-lights in hallways and bathrooms.  Install and use sturdy handrails on stairways.  Wear nonskid footwear, even inside. Don't walk barefoot or in socks without shoes.        Be safe outside.   Use handrails, curb cuts, and ramps whenever possible.  Keep your hands free by using a shoulder bag or backpack.  Try to walk in well-lit areas. Watch out for uneven ground, changes in pavement, and debris.  Be careful in the winter. Walk on the grass or gravel when sidewalks are slippery. Use de-icer on steps and walkways. Add non-slip devices to shoes.    Put grab bars and nonskid mats in your shower or tub and near the toilet. Try to use a shower chair or bath bench when bathing.   Get into a tub or shower by putting in your weaker leg first. Get out with your strong side first. Have a phone or medical alert  device in the bathroom with you.   Where can you learn more?  Go to [email or web address] and enter G117 to learn more about "Preventing Falls: Care Instructions."  Current as of: February 28, 2022  Content Version: 14.1   2006-2024 [employer], Inc[employer].   Care instructions adapted under license by [facility]. If you have questions about a medical condition or this instruction, always ask your healthcare professional. Healthwise, Incorporated disclaims any warranty or liability for your use of this information.           Learning About Emotional Support  When do you need emotional support?     You might find getting support from others helpful when you have a long-term health problem. Often people feel alone, confused, or scared when coping with an illness. But you aren't alone. Other people are going through the same thing you are and know how you feel.  Talking with others about your feelings can help you feel better.  Your family and friends can give you support. So can your doctor, a support group, or a church. If you have a support network, you will not feel as alone. You will learn new ways to deal with your situation, and you may try harder to overcome it.  Where you can get support  Family and friends: They can help you cope by giving you comfort and encouragement.  Counseling: Professional counseling can help you cope with situations that interfere with your life and cause stress. Counseling can help you understand and deal with your illness.  Your doctor: Find a doctor you trust and feel comfortable with. Be open and honest about your fears and concerns. Your doctor can help you get the right medical treatments, including counseling.  Spiritual or religious groups: They can provide comfort and may be able to help you find counseling or other social support services.  Social groups: They can help you meet new people and get involved in activities you enjoy.  Community support groups: In a  support group, you can talk to others who have dealt with the same problems or illness as you. You can encourage one another and learn ways to cope with tough emotions.  How can you find a support group?  Finding a support group that works for you may take time. There are many options. Some groups have a group leader who helps lead discussions or shares information. Others are less formal. Some meet in person, while others meet online.  Try using these resources to help you find the best support group for you.  Your doctor, health care team, or counselor.  People with the same health concern.  Your local church, mosque, synagogue, or other religious group.  A city, state, or national group that provides support for your health concern. Check your local library or community center for a list of these groups. Or look for information online.  Your local community, friends, and family.  Supportive relationships  A supportive relationship includes emotional support such as love, trust, and understanding, as well as advice and concrete help, such as help managing your time.  Reach out to others  Family and friends can help you. Ask them to:  Listen to you and give you encouragement. This can keep you from feeling hopeless or alone.  Help with small daily tasks or with bigger problems. A helping hand can keep you from feeling overwhelmed.  Help you manage a health problem. For example, ask them to go to doctor visits with you. Your loved ones can offer support by being involved in your medical care.  Respect your relationships  A good relationship is also a two-way street. You count on help from others, but they also count on you.  Know your friends' limits. You don't have to see or call your friends every day. If you are going through a rough patch, ask friends if you can contact them outside of the usual boundaries.  Don't always complain or talk about yourself. Know when it's time to stop talking and listen or just  enjoy your friend's company.  Know that good friends can be a bad influence. For example, if a friend encourages you to drink when you know it will harm you, you may want to end the friendship.  Where can you learn more?  Go to [employer] and enter G092 to learn more about "Learning About Emotional Support."  Current as of: February 05, 2022  Content Version: 14.1   2006-2024 [employer], Incorporated.   Care instructions adapted under license by [facility]. If you have questions about a medical condition or this instruction, always ask your healthcare professional. Healthwise, Incorporated disclaims any warranty or liability for your use of this information.  Learning About Being Active as an Older Adult  Why is being active important as you get older?     Being active is one of the best things you can do for your health. And it's never too late to start. Being active--or getting active, if you aren't already--has definite benefits. It can:  Give you more energy,  Keep your mind sharp.  Improve balance to reduce your risk of falls.  Help you manage chronic illness with fewer medicines.  No matter how old you are, how fit you are, or what health problems you have, there is a form of activity that will work for you. And the more physical activity you can do, the better your overall health will be.  What kinds of activity can help you stay healthy?  Being more active will make your daily activities easier. Physical activity includes planned exercise and things you do in daily life. There are four types of activity:  Aerobic.  Doing aerobic activity makes your heart and lungs strong.  Includes walking, dancing, and gardening.  Aim for at least 2 hours spread throughout the week.  It improves your energy and can help you sleep better.  Muscle-strengthening.  This type of activity can help maintain muscle and strengthen bones.  Includes climbing stairs, using resistance bands, and  lifting or carrying heavy loads.  Aim for at least twice a week.  It can help protect the knees and other joints.  Stretching.  Stretching gives you better range of motion in joints and muscles.  Includes upper arm stretches, calf stretches, and gentle yoga.  Aim for at least twice a week, preferably after your muscles are warmed up from other activities.  It can help you function better in daily life.  Balancing.  This helps you stay coordinated and have good posture.  Includes heel-to-toe walking, tai chi, and certain types of yoga.  Aim for at least 3 days a week.  It can reduce your risk of falling.  Even if you have a hard time meeting the recommendations, it's better to be more active than less active. All activity done in each category counts toward your weekly total. You'd be surprised how daily things like carrying groceries, keeping up with grandchildren, and taking the stairs can add up.  What keeps you from being active?  If you've had a hard time being more active, you're not alone. Maybe you remember being able to do more. Or maybe you've never thought of yourself as being active. It's frustrating when you can't do the things you want. Being more active can help. What's holding you back?  Getting started.  Have a goal, but break it into easy tasks. Small steps build into big accomplishments.  Staying motivated.  If you feel like skipping your activity, remember your goal. Maybe you want to move better and stay independent. Every activity gets you one step closer.  Not feeling your best.  Start with 5 minutes of an activity you enjoy. Prove to yourself you can do it. As you get comfortable, increase your time.  You may not be where you want to be. But you're in the process of getting there. Everyone starts somewhere.  How can you find safe ways to stay active?  Talk with your doctor about any physical challenges you're facing. Make a plan with your doctor if you have a health problem or aren't sure how  to get started with activity.  If you're already active, ask your doctor  if there is anything you should change to stay safe as your body and health change.  If you tend to feel dizzy after you take medicine, avoid activity at that time. Try being active before you take your medicine. This will reduce your risk of falls.  If you plan to be active at home, make sure to clear your space before you get started. Remove things like TV cords, coffee tables, and throw rugs. It's safest to have plenty of space to move freely.  The key to getting more active is to take it slow and steady. Try to improve only a little bit at a time. Pick just one area to improve on at first. And if an activity hurts, stop and talk to your doctor.  Where can you learn more?  Go to [email or web address] and enter P600 to learn more about "Learning About Being Active as an Older Adult."  Current as of: January 17, 2022  Content Version: 14.1   2006-2024 Healthwise, Incorporated.   Care instructions adapted under license by [facility]. If you have questions about a medical condition or this instruction, always ask your healthcare professional. Healthwise, Incorporated disclaims any warranty or liability for your use of this information.           Learning About Dental Care for Older Adults  Dental care for older adults: Overview  Dental care for older people is much the same as for younger adults. But older adults do have concerns that younger adults do not. Older adults may have problems with gum disease and decay on the roots of their teeth. They may need missing teeth replaced or broken fillings fixed. Or they may have dentures that need to be cared for. Some older adults may have trouble holding a toothbrush.  You can help remind the person you are caring for to brush and floss their teeth or to clean their dentures. In some cases, you may need to do the brushing and other dental care tasks. People who have trouble using their  hands or who have dementia may need this extra help.  How can you help with dental care?  Normal dental care  To keep the teeth and gums healthy:  Brush the teeth with fluoride toothpaste twice a day--in the morning and at night--and floss at least once a day. Plaque can quickly build up on the teeth of older adults.  Watch for the signs of gum disease. These signs include gums that bleed after brushing or after eating hard foods, such as apples.  See a dentist regularly. Many experts recommend checkups every 6 months.  Keep the dentist up to date on any new medications the person is taking.  Encourage a balanced diet that includes whole grains, vegetables, and fruits, and that is low in saturated fat and sodium.  Encourage the person you're caring for not to use tobacco products. They can affect dental and general health.  Many older adults have a fixed income and feel that they can't afford dental care. But most towns and cities have programs in which dentists help older adults by lowering fees. Contact your area's public health offices or social services for information about dental care in your area.  Using a toothbrush  Older adults with arthritis sometimes have trouble brushing their teeth because they can't easily hold the toothbrush. Their hands and fingers may be stiff, painful, or weak. If this is the case, you can:  Offer an Mining engineer toothbrush.  Enlarge the handle of  a non-electric toothbrush by wrapping a sponge, an elastic bandage, or adhesive tape around it.  Push the toothbrush handle through a ball made of rubber or soft foam.  Make the handle longer and thicker by taping Popsicle sticks or tongue depressors to it.  You may also be able to buy special toothbrushes, toothpaste dispensers, and floss holders.  Your doctor may recommend a soft-bristle toothbrush if the person you care for bleeds easily. Bleeding can happen because of a health problem or from certain medicines.  A toothpaste for  sensitive teeth may help if the person you care for has sensitive teeth.  How do you brush and floss someone's teeth?  If the person you are caring for has a hard time cleaning their teeth on their own, you may need to brush and floss their teeth for them. It may be easiest to have the person sit and face away from you, and to sit or stand behind them. That way you can steady their head against your arm as you reach around to floss and brush their teeth. Choose a place that has good lighting and is comfortable for both of you.  Before you begin, gather your supplies. You will need gloves, floss, a toothbrush, and a container to hold water if you are not near a sink. Wash and dry your hands well and put on gloves. Start by flossing:  Gently work a piece of floss between each of the teeth toward the gums. A plastic flossing tool may make this easier, and they are available at most drugstores.  Curve the floss around each tooth into a U-shape and gently slide it under the gum line.  Move the floss firmly up and down several times to scrape off the plaque.  After you've finished flossing, throw away the used floss and begin brushing:  Wet the brush and apply toothpaste.  Place the brush at a 45-degree angle where the teeth meet the gums. Press firmly, and move the brush in small circles over the surface of the teeth.  Be careful not to brush too hard. Vigorous brushing can make the gums pull away from the teeth and can scratch the tooth enamel.  Brush all surfaces of the teeth, on the tongue side and on the cheek side. Pay special attention to the front teeth and all surfaces of the back teeth.  Brush chewing surfaces with short back-and-forth strokes.  After you've finished, help the person rinse the remaining toothpaste from their mouth.  Where can you learn more?  Go to [email or web address] and enter F944 to learn more about "Learning About Dental Care for Older Adults."  Current as of: March 20, 2022  Content Version: 14.1   2006-2024 Healthwise, Incorporated.   Care instructions adapted under license by [facility]. If you have questions about a medical condition or this instruction, always ask your healthcare professional. Healthwise, Incorporated disclaims any warranty or liability for your use of this information.           Hearing Loss: Care Instructions  Overview     Hearing loss is a sudden or slow decrease in how well you hear. It can range from slight to profound. Permanent hearing loss can occur with aging. It also can happen when you are exposed long-term to loud noise. Examples include listening to loud music, riding motorcycles, or being around other loud machines.  Hearing loss can affect your work and home life. It can make you feel lonely or depressed. You  may feel that you have lost your independence. But hearing aids and other devices can help you hear better and feel connected to others.  Follow-up care is a key part of your treatment and safety. Be sure to make and go to all appointments, and call your doctor if you are having problems. It's also a good idea to know your test results and keep a list of the medicines you take.  How can you care for yourself at home?  Avoid loud noises whenever possible. This helps keep your hearing from getting worse.  Always wear hearing protection around loud noises.  Wear a hearing aid as directed.  A professional can help you pick a hearing aid that will work best for you.  You can also get hearing aids over the counter for mild to moderate hearing loss.  Have hearing tests as your doctor suggests. They can show whether your hearing has changed. Your hearing aid may need to be adjusted.  Use other devices as needed. These may include:  Telephone amplifiers and hearing aids that can connect to a television, stereo, radio, or microphone.  Devices that use lights or vibrations. These alert you to the doorbell, a ringing telephone, or a baby  monitor.  Television closed-captioning. This shows the words at the bottom of the screen. Most new TVs can do this.  TTY (text telephone). This lets you type messages back and forth on the telephone instead of talking or listening. These devices are also called TDD. When messages are typed on the keyboard, they are sent over the phone line to a receiving TTY. The message is shown on a monitor.  Use text messaging, social media, and email if it is hard for you to communicate by telephone.  Try to learn a listening technique called speechreading. It is not lipreading. You pay attention to people's gestures, expressions, posture, and tone of voice. These clues can help you understand what a person is saying. Face the person you are talking to, and have them face you. Make sure the lighting is good. You need to see the other person's face clearly.  Think about counseling if you need help to adjust to your hearing loss.  When should you call for help?  Watch closely for changes in your health, and be sure to contact your doctor if:   You think your hearing is getting worse.    You have new symptoms, such as dizziness or nausea.   Where can you learn more?  Go to [email or web address] and enter R798 to learn more about "Hearing Loss: Care Instructions."  Current as of: May 11, 2022  Content Version: 14.1   2006-2024 Healthwise, Incorporated.   Care instructions adapted under license by [facility]. If you have questions about a medical condition or this instruction, always ask your healthcare professional. Healthwise, Incorporated disclaims any warranty or liability for your use of this information.           Learning About Vision Tests  What are vision tests?     The four most common vision tests are visual acuity tests, refraction, visual field tests, and color vision tests.  Visual acuity (sharpness) tests  These tests are used:  To see if you need glasses or contact lenses.  To monitor an eye  problem.  To check an eye injury.  Visual acuity tests are done as part of routine exams. You may also have this test when you get your driver's license or apply for some  types of jobs.  Visual field tests  These tests are used:  To check for vision loss in any area of your range of vision.  To screen for certain eye diseases.  To look for nerve damage after a stroke, head injury, or other problem that could reduce blood flow to the brain.  Refraction and color tests  A refraction test is done to find the right prescription for glasses and contact lenses.  A color vision test is done to check for color blindness.  Color vision is often tested as part of a routine exam. You may also have this test when you apply for a job where recognizing different colors is important, such as truck driving, Optician, dispensing, or the [employer].  How are vision tests done?  Visual acuity test   You cover one eye at a time.  You read aloud from a wall chart across the room.  You read aloud from a small card that you hold in your hand.  Refraction   You look into a special device.  The device puts lenses of different strengths in front of each eye to see how strong your glasses or contact lenses need to be.  Visual field tests   Your doctor may have you look through special machines.  Or your doctor may simply have you stare straight ahead while they move a finger into and out of your field of vision.  Color vision test   You look at pieces of printed test patterns in various colors. You say what number or symbol you see.  Your doctor may have you trace the number or symbol using a pointer.  How do these tests feel?  There is very little chance of having a problem from this test. If dilating drops are used for a vision test, they may make the eyes sting and cause a medicine taste in the mouth.  Follow-up care is a key part of your treatment and safety. Be sure to make and go to all appointments, and call your doctor if you are having  problems. It's also a good idea to know your test results and keep a list of the medicines you take.  Where can you learn more?  Go to [employer] and enter G551 to learn more about "Learning About Vision Tests."  Current as of: January 17, 2022  Content Version: 14.1   2006-2024 Healthwise, Incorporated.   Care instructions adapted under license by [facility]. If you have questions about a medical condition or this instruction, always ask your healthcare professional. Health[employer], Incorporated disclaims any warranty or liability for your use of this information.           Advance Directives: Care Instructions  Overview  An advance directive is a legal way to state your wishes at the end of your life. It tells your family and your doctor what to do if you can't say what you want.  There are two main types of advance directives. You can change them any time your wishes change.  Living will.  This form tells your family and your doctor your wishes about life support and other treatment. The form is also called a declaration.  Medical power of attorney.  This form lets you name a person to make treatment decisions for you when you can't speak for yourself. This person is called a health care agent (health care proxy, health care surrogate). The form is also called a durable power of attorney for health care.  If you do not have an advance directive, decisions about your medical care may be made by a family member, or by a doctor or a judge who doesn't know you.  It may help to think of an advance directive as a gift to the people who care for you. If you have one, they won't have to make tough decisions by themselves.  For more information, including forms for your state, see the [employer] website ([employer]).  Follow-up care is a key part of your treatment and safety. Be sure to make and go to all appointments, and call your doctor if you are having  problems. It's also a good idea to know your test results and keep a list of the medicines you take.  What should you include in an advance directive?  Many states have a unique advance directive form. (It may ask you to address specific issues.) Or you might use a universal form that's approved by many states.  If your form doesn't tell you what to address, it may be hard to know what to include in your advance directive. Use the questions below to help you get started.  Who do you want to make decisions about your medical care if you are not able to?  What life-support measures do you want if you have a serious illness that gets worse over time or can't be cured?  What are you most afraid of that might happen? (Maybe you're afraid of having pain, losing your independence, or being kept alive by machines.)  Where would you prefer to die? (Your home? A hospital? A nursing home?)  Do you want to donate your organs when you die?  Do you want certain religious practices performed before you die?  When should you call for help?  Be sure to contact your doctor if you have any questions.  Where can you learn more?  Go to [employer] and enter R264 to learn more about "Advance Directives: Care Instructions."  Current as of: June 30, 2022  Content Version: 14.1   2006-2024 Healthwise, Incorporated.   Care instructions adapted under license by [facility]. If you have questions about a medical condition or this instruction, always ask your healthcare professional. Healthwise, Incorporated disclaims any warranty or liability for your use of this information.           A Healthy Heart: Care Instructions  Overview     Coronary artery disease, also called heart disease, occurs when a substance called plaque builds up in the vessels that supply oxygen-rich blood to your heart muscle. This can narrow the blood vessels and reduce blood flow. A heart attack happens when blood flow is completely  blocked. A high-fat diet, smoking, and other factors increase the risk of heart disease.  Your doctor has found that you have a chance of having heart disease. A heart-healthy lifestyle can help keep your heart healthy and prevent heart disease. This lifestyle includes eating healthy, being active, staying at a weight that's healthy for you, and not smoking or using tobacco. It also includes taking medicines as directed, managing other health conditions, and trying to get a healthy amount of sleep.  Follow-up care is a key part of your treatment and safety. Be sure to make and go to all appointments, and call your doctor if you are having problems. It's also a good idea to know your test results and keep a list of the medicines you take.  How can you care for yourself at  home?  Diet   Use less salt when you cook and eat. This helps lower your blood pressure. Taste food before salting. Add only a little salt when you think you need it. With time, your taste buds will adjust to less salt.    Eat fewer snack items, fast foods, canned soups, and other high-salt, high-fat, processed foods.    Read food labels and try to avoid saturated and trans fats. They increase your risk of heart disease by raising cholesterol levels.    Limit the amount of solid fat--butter, margarine, and shortening--you eat. Use olive, peanut, or canola oil when you cook. Bake, broil, and steam foods instead of frying them.    Eat a variety of fruit and vegetables every day. Dark green, deep orange, red, or yellow fruits and vegetables are especially good for you. Examples include spinach, carrots, peaches, and berries.    Foods high in fiber can reduce your cholesterol and provide important vitamins and minerals. High-fiber foods include whole-grain cereals and breads, oatmeal, beans, brown rice, citrus fruits, and apples.    Eat lean proteins. Heart-healthy proteins include seafood, lean meats and poultry, eggs, beans, peas, nuts, seeds,  and soy products.    Limit drinks and foods with added sugar. These include candy, desserts, and soda pop.   Heart-healthy lifestyle   If your doctor recommends it, get more exercise. For many people, walking is a good choice. Or you may want to swim, bike, or do other activities. Bit by bit, increase the time you're active every day. Try for at least 30 minutes on most days of the week.    Try to quit or cut back on using tobacco and other nicotine products. This includes smoking and vaping. If you need help quitting, talk to your doctor about stop-smoking programs and medicines. These can increase your chances of quitting for good. Quitting is one of the most important things you can do to protect your heart. It is never too late to quit. Try to avoid secondhand smoke too.    Stay at a weight that's healthy for you. Talk to your doctor if you need help losing weight.    Try to get 7 to 9 hours of sleep each night.    Limit alcohol to 2 drinks a day for men and 1 drink a day for women. Too much alcohol can cause health problems.    Manage other health problems such as diabetes, high blood pressure, and high cholesterol. If you think you may have a problem with alcohol or drug use, talk to your doctor.   Medicines   Take your medicines exactly as prescribed. Call your doctor if you think you are having a problem with your medicine.    If your doctor recommends aspirin, take the amount directed each day. Make sure you take aspirin and not another kind of pain reliever, such as acetaminophen (Tylenol).   When should you call for help?   Call 911 if you have symptoms of a heart attack. These may include:   Chest pain or pressure, or a strange feeling in the chest.    Sweating.    Shortness of breath.    Pain, pressure, or a strange feeling in the back, neck, jaw, or upper belly or in one or both shoulders or arms.    Lightheadedness or sudden weakness.    A fast or irregular heartbeat.   After you call  911, the operator may tell you to chew 1  adult-strength or 2 to 4 low-dose aspirin. Wait for an ambulance. Do not try to drive yourself.  Watch closely for changes in your health, and be sure to contact your doctor if you have any problems.  Where can you learn more?  Go to [employer] and enter F075 to learn more about "A Healthy Heart: Care Instructions."  Current as of: February 05, 2022  Content Version: 14.1   2006-2024 Healthwise, Incorporated.   Care instructions adapted under license by [facility]. If you have questions about a medical condition or this instruction, always ask your healthcare professional. Healthwise, Incorporated disclaims any warranty or liability for your use of this information.      Personalized Preventive Plan for Hunter Higgins - 03/16/2023  Medicare offers a range of preventive health benefits. Some of the tests and screenings are paid in full while other may be subject to a deductible, co-insurance, and/or copay.    Some of these benefits include a comprehensive review of your medical history including lifestyle, illnesses that may run in your family, and various assessments and screenings as appropriate.    After reviewing your medical record and screening and assessments performed today your provider may have ordered immunizations, labs, imaging, and/or referrals for you.  A list of these orders (if applicable) as well as your Preventive Care list are included within your After Visit Summary for your review.    Other Preventive Recommendations:    A preventive eye exam performed by an eye specialist is recommended every 1-2 years to screen for glaucoma; cataracts, macular degeneration, and other eye disorders.  A preventive dental visit is recommended every 6 months.  Try to get at least 150 minutes of exercise per week or 10,000 steps per day on a pedometer .  Order or download the FREE "Exercise & Physical Activity: Your Everyday Guide" from [employer]. Call 740-266-4948 or search [employer] online.  You need 1200-1500 mg of calcium and 1000-2000 IU of vitamin D per day. It is possible to meet your calcium requirement with diet alone, but a vitamin D supplement is usually necessary to meet this goal.  When exposed to the sun, use a sunscreen that protects against both UVA and UVB radiation with an SPF of 30 or greater. Reapply every 2 to 3 hours or after sweating, drying off with a towel, or swimming.  Always wear a seat belt when traveling in a car. Always wear a helmet when riding a bicycle or motorcycle.

## 2023-03-17 NOTE — Telephone Encounter (Signed)
-----   Message from Erven Colla, MD sent at 03/16/2023  5:31 PM EDT -----  Regarding: Called patient  Call patient and instruct him to contact his podiatrist and inform the podiatrist that left heel has turned into a stage 2 pressure ulcer.

## 2023-03-17 NOTE — Telephone Encounter (Signed)
Pt advised. States will call.

## 2023-03-17 NOTE — Telephone Encounter (Signed)
Josie with HH called, pt need refill on medication. Medication pend for refill. Also pt bp was 118/52 today. States pt haven't had any water today and not feeling the best since coming from Dialysis today. Pt has had breakfast and lunch. Josie advise to him to make sure he drink his water.

## 2023-03-18 MED ORDER — SEVELAMER CARBONATE 800 MG PO TABS
800 MG | ORAL_TABLET | Freq: Three times a day (TID) | ORAL | 0 refills | Status: AC
Start: 2023-03-18 — End: ?

## 2023-03-20 NOTE — Telephone Encounter (Signed)
Hunter [facility]gins with [facility] stated the patient weighed 167 on Friday and as of today he weighs 171.    Hunter Higgins also mentioned the Patient informed her that he drank some beer today and over indulged on his meals.

## 2023-03-21 NOTE — Telephone Encounter (Signed)
-----   Message from Erven Colla, MD sent at 03/18/2023  3:04 PM EDT -----  Regarding: refill quesiton  Tell Josie from [facility] that Sevelamer is refilled by nephrologist. I will send a month supply, further refills has to be directed to nephrologist.

## 2023-03-22 NOTE — Telephone Encounter (Signed)
Hunter Higgins wanted to report weight loss on patient since Monday.  Monday weight was 171 lb - and today it is 168.8 lb.

## 2023-03-28 NOTE — Telephone Encounter (Signed)
Hunter Higgins with [facility] calling in refills for       ferrous sulfate (IRON 325) 325 (65 Fe) MG tablet [[id number]]    Order Details  Dose: 325 mg Route: Oral Frequency: EVERY OTHER DAY   Dispense Quantity: -- Refills: --          Sig: Take 1 tablet by mouth every other day   calcitRIOL (ROCALTROL) 0.25 MCG capsule [[id number]]    Order Details  Dose: 0.25 mcg Route: Oral Frequency: DAILY   Dispense Quantity: -- Refills: --            sig: Take 1 capsule by mouth daily     Future Appointments   Date Time Provider Department Center   06/20/2023  9:00 AM Erven Colla, MD [facility] ECC DEP

## 2023-03-28 NOTE — Telephone Encounter (Signed)
Josie with [facility] calling in to inform you that patient had bent over to tie his shoe and almost fell over he stumbled but didn't fall no injuries to report at this time

## 2023-03-30 ENCOUNTER — Ambulatory Visit: Payer: MEDICARE | Attending: Internal Medicine | Primary: Internal Medicine

## 2023-03-30 NOTE — Telephone Encounter (Signed)
Pt called In inquiring about his prescription. He went to pick them up and they were not there.     Informed patient that it generally takes 2-3 business days.

## 2023-03-30 NOTE — Progress Notes (Signed)
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 I discussed with patient that he needs to float his heel off the bed.  The pressure is causing the ulcer.  He verbalizes understanding and all questions answered  2.Chemical cauterization left heel ulcer using a silver nitrate  Continue Aquacel Ag dressing changes 3 times a week  Patient to return in 3 weeks or sooner if any problems       Subjective: Hunter Higgins is post op 7 weeks.  Patient denies any fever, chills, nausea, vomiting.  Pain is 5/10 on VAS scale.  Patient is ambulating with surgical shoe and walker    Objective:    Physical Exam:       Exam today reveals decrease edema left foot.  There is dehiscence at the first ray amputation site left foot.  Does not probe to bone.  No drainage.  There is an ulcer lateral fifth MPJ and posterior left heel.  Measures 3.5cm x 4.5cm x 0.1cm left heel with hypergranular base.  Does not probe to bone.[name]          Daryll Brod, DPM, FACFAS  [facility] [place]  03/30/2023 9:59 AM

## 2023-03-31 NOTE — Telephone Encounter (Signed)
LVM on pts phone to return call.

## 2023-03-31 NOTE — Telephone Encounter (Signed)
Refill request by Home Health Nurse, to be sent to the [facility] on file.    alcitRIOL (ROCALTROL) 0.25 MCG capsule [[id number]]    Order Details  Dose: 0.25 mcg Route: Oral Frequency: DAILY   Dispense Quantity: -- Refills: --          Sig: Take 1 capsule by mouth daily       Last Appointment:  03/16/2023  Future Appointments   Date Time Provider Department Center   04/25/2023  1:15 PM Erven Colla, MD [facility] ECC DEP   06/20/2023  9:00 AM Erven Colla, MD [facility] ECC DEP

## 2023-03-31 NOTE — Telephone Encounter (Signed)
Received call from Hunter Higgins nurse with [facility]. Hunter Higgins was advised by Podiatry yesterday during his appt to stay off his heel being that the pressure is causing the ulcer. Hunter Higgins went to his home yesterday and states he was walking around on his heel with no support the whole time.

## 2023-03-31 NOTE — Telephone Encounter (Signed)
Josie with [facility] is calling in  to let you know the patient's BP has been elevated.  States it was 152/60 on Monday & 142/86  on Wed, did not give a reading for today.  Also states the patient weight was up 3lbs on Mon but seems to have gone back to normal as of Wed.

## 2023-03-31 NOTE — Telephone Encounter (Signed)
Pt needs to CONTACT: Samson Frederic, MD NEPHROLOGIST. Will reach out to pt.

## 2023-04-03 NOTE — Telephone Encounter (Signed)
Patient is requesting refill      calcitRIOL (ROCALTROL) 0.25 MCG capsule [[id number]]    Order Details  Dose: 0.25 mcg Route: Oral Frequency: DAILY   Dispense Quantity: -- Refills: --          Sig: Take 1 capsule by mouth daily       Future Appointments   Date Time Provider Department Center   04/25/2023  1:15 PM Erven Colla, MD [facility] ECC DEP   06/20/2023  9:00 AM Erven Colla, MD [facility] ECC DEP

## 2023-04-04 NOTE — Telephone Encounter (Signed)
Patient is returing call, information given for nephrology office

## 2023-04-05 NOTE — Progress Notes (Signed)
HPI:   72 yo male who presents in f/u of left SFA and posterior tibial artery angioplasty 02/10/23.  The patient had a prior attempt at intervention the week prior but had an intraprocedural arrest.  Ankle-brachial indices are 0.67 on the right and 0.64 on the left and are unchanged from prior to his intervention.  He has a left heel ulcer which is granulating, contracting and is very superficial.  However he continues to apply pressure to the heel as he sits in the chair.  I emphasized that he must offload this.  He has a walker.  He lives on the [place] is being cared for by Ronda Fairly from podiatry.  I am going to refer him to wound care clinic at [facility] for their assessment and possible HBO as the perfusion of the forefoot and hindfoot for that matter is rather poor.  His arterial supply to his foot really becomes rather abysmal at the ankle; he has axial patency proximal to that area.  In any event he requests follow-up with me in 6 weeks which we will perform.  I will also duplex his left leg at that time but not repeat his ABI.    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  Patient verbalizes understanding and agrees with plan of care.  All questions were answered.  Follow Up In 6 weeks    SUBJECTIVE:  Chief Complaint   Patient presents with   . Post Op   . RESULTS     With PVL        HPI  Past Medical History:   Diagnosis Date   . Abnormal nuclear stress test    . Chronic diastolic (congestive) heart failure (HCC)    . Chronic kidney disease, stage IV (severe) (HCC)    . Diabetic foot ulcer (HCC)    . Diabetic retinopathy (HCC)    . DM2 (diabetes mellitus, type 2) (HCC)    . Essential  (primary) hypertension    . GERD (gastroesophageal reflux disease)    . Male erectile dysfunction, unspecified    . Peripheral arterial disease (HCC)    . Prostate cancer (HCC)    . Sarcoidosis, unspecified     In remission   . SBO (small bowel obstruction) (HCC)    . Vitreous hemorrhage (HCC)      Past Surgical History:   Procedure Laterality Date   . AMPUTATION Left 02/01/2023    Procedure: AMPUTATION, FOOT, TRANSMETATARSAL;  Surgeon: Francisca December, DPM   . AMPUTATION Left 02/10/2023    Procedure: AMPUTATION, FOOT, TRANSMETATARSAL;  Surgeon: Daryll Brod, DPM   . EXPLORATORY LAPAROTOMY      Lysis of adhesions   . HEART CATHETERIZATION  12/30/2020        . HERNIA REPAIR      Inguinal   . ORTHOPEDIC SURGERY      Shoulder   . RADICAL PROSTATECTOMY     . VENOUS ACCESS CATHETER INSERTION N/A 12/28/2020    Procedure: INSERTION, CATHETER, TUNNELED, FOR DIALYSIS;  Surgeon: Nani Ravens, MD   . VENOUS ACCESS CATHETER REPLACEMENT N/A 04/09/2021    Procedure: REPLACEMENT, CATHETER, HEMODIALYSIS, TUNNELED;  Surgeon: Erin Fulling, Maurie Boettcher, MD  Family History   Problem Relation Age of Onset   . Diabetes Mother      Social History     Socioeconomic History   . Marital status: Widowed     Spouse name: Not on file   . Number of children: Not on file   . Years of education: Not on file   . Highest education level: Not on file   Occupational History   . Not on file   Tobacco Use   . Smoking status: Former   . Smokeless tobacco: Never   Vaping Use   . Vaping status: Never Used   Substance and Sexual Activity   . Alcohol use: Yes     Comment: Occasional social   . Drug use: Never   . Sexual activity: Not Currently   Other Topics Concern   . Not on file   Social History Narrative   . Not on file     Social Determinants of Health     Financial Resource Strain: Low Risk  (03/16/2023)    Received from [facility]    Overall Physicist, medical Strain (CARDIA)    . Difficulty of Paying Living Expenses: Not hard at all    Food Insecurity: No Food Insecurity (03/16/2023)    Received from [facility]    Hunger Vital Sign    . Worried About Programme researcher, broadcasting/film/video in the Last Year: Never true    . Ran Out of Food in the Last Year: Never true   Transportation Needs: Unknown (03/16/2023)    Received from [facility]    PRAPARE - Transportation    . Lack of Transportation (Medical): Not on file    . Lack of Transportation (Non-Medical): No   Physical Activity: Inactive (03/16/2023)    Received from [facility]    Exercise Vital Sign    . Days of Exercise per Week: 0 days    . Minutes of Exercise per Session: 0 min   Stress: Not on file   Social Connections: Not on file   Intimate Partner Violence: Not on file   Housing Stability: Unknown (02/17/2022)    Received from [facility]    Housing Stability Vital Sign    . Unable to Pay for Housing in the Last Year: Not on file    . Number of Places Lived in the Last Year: Not on file    . In the last 12 months, was there a time when you did not have a steady place to sleep or slept in a shelter (including now)?: No     E-Cigarette Use: Never User   Start Date:    Quit Date:    Passive Exposure:    Counseling Given:    Comments:    Nicotine:    THC:    CBD:     Flavoring:    Other:        Current Outpatient Medications   Medication Sig Dispense Refill   . aspirin 81 mg PO CHEW Take 1 Tab by Mouth Once a Day. 30 Tab 1   . atorvastatin (LIPITOR) 20 mg PO TABS Take 1 Tab by Mouth Every Night at Bedtime. 30 Tab 1   . b complex-vitamin c-folic acid (NEPHRO/TRIPHROCAPS) 1 mg PO CAPS Take 1 Cap by Mouth Once a Day. 30 Cap 1   . calcitRIOL (ROCALTROL) 0.25 mcg  PO CAPS Take 1 Cap by Mouth Once a Day. 30 Cap 0   . dulaglutide (TRULICITY) 0.75 mg/0.5 mL SC Pen Injector Inject 0.5 mL beneath the skin Every 7 Days. Tuesday     . epoetin alfa, ESRD, (EPOGEN; PROCRIT) 4,000 unit/mL Inj SOLN Inject 1  mL beneath the skin mon-wed-fri at 2100.     [name] epoetin alfa-epbx, ESRD, (RETACRIT) 10,000 unit/mL Inj SOLN Inject 0.4 mL beneath the skin Every Monday, Wednesday & Friday.     . ferrous sulfate (FEOSOL) 325 mg (65 mg Iron) PO TABS Take 1 Tab by Mouth Every Other Day. 30 Tab 0   . NIFEdipine 24HR (PROCARDIA XL) 90 mg PO TR24 Take 1 Tab by Mouth Once a Day.     . oxyCODONE-acetaminophen (PERCOCET) 5-325 mg PO TABS Take 1 Tab by Mouth Every 8 Hours As Needed for Moderate Pain (Pain Score 4-6). 20 Tab 0   . polyethylene glycol (MIRALAX) 17 gram PO PwPk Take 1 Packet by Mouth Once a Day.     . sevelamer carbonate (RENVELA) 800 mg PO TABS Take 1 Tab by Mouth Three Times Daily with Meals.     . sodium hypochlorite (DAKINS 1/4 STRENGTH) 0.125 % Misc SOLN Use 1 Application to affected area Q2D 0900. Indications: skin disinfection     . TYPE-IN DME Wound Care Orders    Start Aquacel Ag to the amputation site and heel ulcer left foot with 4x4's, 4" Kerlix and 4" ace wrap.  Change 3 times a week 1 Each 0     No current facility-administered medications for this visit.     Allergies   Allergen Reactions   . Naproxen anaphylaxis/angioedema       Review of Systems   Constitutional:  Negative for activity change, chills, diaphoresis and fever.   HENT:  Negative for dental problem, ear discharge, facial swelling, tinnitus and trouble swallowing.    Eyes:  Negative for discharge and visual disturbance.   Respiratory:  Negative for cough, choking and chest tightness.    Cardiovascular:  Negative for chest pain and leg swelling.   Gastrointestinal:  Negative for abdominal pain, constipation, nausea and vomiting.   Endocrine: Negative for polydipsia and polyuria.   Genitourinary:  Negative for dysuria, flank pain and hematuria.   Musculoskeletal:  Negative for gait problem, joint swelling and neck pain.   Skin:  Negative for pallor and wound.   Allergic/Immunologic: Negative for immunocompromised state.   Neurological:  Negative for  dizziness, seizures, speech difficulty, weakness, numbness and headaches.   Psychiatric/Behavioral:  Negative for behavioral problems, confusion, hallucinations and self-injury.        OBJECTIVE:  BP 160/80 (Site: Arm L, Position: Sitting)   Ht 5\' 7"  (1.702 m)   Wt 80.3 kg (177 lb)   BMI 27.72 kg/m  BMI: Data Unavailable    Physical Exam  HENT:      Head: Normocephalic and atraumatic.      Nose: Nose normal.   Eyes:      Extraocular Movements: Extraocular movements intact.   Cardiovascular:      Rate and Rhythm: Normal rate and regular rhythm.      Pulses:           Radial pulses are 2+ on the right side and 2+ on the left side.        Femoral pulses are 2+ on the right side and 2+ on the left side.       Popliteal pulses are 2+  on the right side and 2+ on the left side.        Dorsalis pedis pulses are detected w/ Doppler on the right side and detected w/ Doppler on the left side.        Posterior tibial pulses are detected w/ Doppler on the right side and detected w/ Doppler on the left side.   Pulmonary:      Effort: Pulmonary effort is normal.      Breath sounds: Normal breath sounds.   Abdominal:      Palpations: Abdomen is soft.   Musculoskeletal:         General: Normal range of motion.      Cervical back: Normal range of motion and neck supple.   Skin:     General: Skin is warm and dry.   Neurological:      General: No focal deficit present.      Mental Status: He is alert and oriented to person, place, and time.   Psychiatric:         Mood and Affect: Mood normal.     I have reviewed information entered by the clinical staff and/or patient and verified it as accurate or edited where necessary.

## 2023-04-05 NOTE — Progress Notes (Signed)
04/05/2023   Hunter Higgins      The patient is here today to have their vascular study performed.    [name]

## 2023-04-05 NOTE — Telephone Encounter (Signed)
Josie with [facility] is calling to let you know the patient has 2 new wounds on his (L) foot.  They will be treating the wounds.

## 2023-04-05 NOTE — Telephone Encounter (Signed)
Danida called on behalf of patient to report his weight of 174, nurse was instructed to notify doctor if it was greater than 172.    Danida stated he missed dialysis due to a vascular appt. today.

## 2023-04-10 NOTE — Telephone Encounter (Signed)
Jeannie from [facility] called on behalf of the pt stating that the patient is being discharged from the agency due to some inappropriate comments to several nurses which is deemed for nurses are unable to return to pt's home. Pt was receiving  wound care pt will need  another home health agency or something of that nature pt is aware

## 2023-04-12 NOTE — Progress Notes (Signed)
 HPI:   72 yo male who presents in f/u of left SFA and posterior tibial artery angioplasty 02/10/23.  The patient had a prior attempt at intervention the week prior but had an intraprocedural arrest.  Ankle-brachial indices are 0.67 on the right and 0.64 on the left and are unchanged from prior to his intervention.  He has a left heel ulcer which is granulating, contracting and is very superficial.  However he continues to apply pressure to the heel as he sits in the chair.  I emphasized that he must offload this.  He has a walker.  [place] is being cared for by Barnie Jama Robert from podiatry.  I am going to refer him to the wound care clinic at [facility] for their assessment and possible HBO as the perfusion of the forefoot and hindfoot for that matter is rather poor.  His arterial supply to his foot really becomes rather abysmal at the ankle; He has axial patency proximal to that area.  In any event he requests follow-up with me in 6 weeks which we will perform.     Mr. Scales returns after just 1 week and duplex now reveals axial patency.  He does have a proximal SFA stenosis of 50 to 75% which will be followed.  The foot is improving.  He does have bilateral lower extremity edema and I have encouraged elevation and ambulation.  I will plan to see him again in 6 weeks and he will see the wound care clinic tomorrow.    ASSESSMENT:  As detailed in the progress note below, the patient was assessed for:  (I73.9) PAD (peripheral artery disease) (HCC)    PLAN:  Discussed evaluation, treatment and usual course.  Patient verbalizes understanding and agrees with plan of care.  All questions were answered.  Follow Up In 6 weeks    SUBJECTIVE:  Chief Complaint   Patient presents with   . WOUND CHECK     lle   . RESULTS     W/ pvl       HPI  Past Medical History:   Diagnosis Date   . Abnormal nuclear stress test    . Chronic diastolic (congestive) heart failure (HCC)    . Chronic kidney disease,  stage IV (severe) (HCC)    . Diabetic foot ulcer (HCC)    . Diabetic retinopathy (HCC)    . DM2 (diabetes mellitus, type 2) (HCC)    . Essential (primary) hypertension    . GERD (gastroesophageal reflux disease)    . Male erectile dysfunction, unspecified    . Peripheral arterial disease (HCC)    . Prostate cancer (HCC)    . Sarcoidosis, unspecified     In remission   . SBO (small bowel obstruction) (HCC)    . Vitreous hemorrhage (HCC)      Past Surgical History:   Procedure Laterality Date   . AMPUTATION Left 02/01/2023    Procedure: AMPUTATION, FOOT, TRANSMETATARSAL;  Surgeon: Raeanne Faden, DPM   . AMPUTATION Left 02/10/2023    Procedure: AMPUTATION, FOOT, TRANSMETATARSAL;  Surgeon: Hayes Tresea MATSU, DPM   . EXPLORATORY LAPAROTOMY      Lysis of adhesions   . HEART CATHETERIZATION  12/30/2020        . HERNIA REPAIR      Inguinal   . ORTHOPEDIC SURGERY      Shoulder   . RADICAL PROSTATECTOMY     . VENOUS ACCESS CATHETER INSERTION N/A 12/28/2020  Procedure: INSERTION, CATHETER, TUNNELED, FOR DIALYSIS;  Surgeon: Alfornia Peck, MD   . VENOUS ACCESS CATHETER REPLACEMENT N/A 04/09/2021    Procedure: REPLACEMENT, CATHETER, HEMODIALYSIS, TUNNELED;  Surgeon: Margart Rank, Arlene FALCON, MD     Family History   Problem Relation Age of Onset   . Diabetes Mother      Social History     Socioeconomic History   . Marital status: Widowed     Spouse name: Not on file   . Number of children: Not on file   . Years of education: Not on file   . Highest education level: Not on file   Occupational History   . Not on file   Tobacco Use   . Smoking status: Former   . Smokeless tobacco: Never   Vaping Use   . Vaping status: Never Used   Substance and Sexual Activity   . Alcohol use: Yes     Comment: Occasional social   . Drug use: Never   . Sexual activity: Not Currently   Other Topics Concern   . Not on file   Social History Narrative   . Not on file     Social Determinants of Health     Financial Resource Strain: Low Risk  (03/16/2023)    Received  from [facility]    Overall Physicist, Medical Strain (CARDIA)    . Difficulty of Paying Living Expenses: Not hard at all   Food Insecurity: No Food Insecurity (03/16/2023)    Received from [facility]    Hunger Vital Sign    . Worried About Programme Researcher, Broadcasting/film/video in the Last Year: Never true    . Ran Out of Food in the Last Year: Never true   Transportation Needs: Unknown (03/16/2023)    Received from [facility]    PRAPARE - Transportation    . Lack of Transportation (Medical): Not on file    . Lack of Transportation (Non-Medical): No   Physical Activity: Inactive (03/16/2023)    Received from [facility]    Exercise Vital Sign    . Days of Exercise per Week: 0 days    . Minutes of Exercise per Session: 0 min   Stress: Not on file   Social Connections: Not on file   Intimate Partner Violence: Not on file   Housing Stability: Unknown (02/17/2022)    Received from [facility]    Housing Stability Vital Sign    . Unable to Pay for Housing in the Last Year: Not on file    . Number of Places Lived in the Last Year: Not on file    . In the last 12 months, was there a time when you did not have a steady place to sleep or slept in a shelter (including now)?: No     E-Cigarette Use: Never User   Start Date:    Quit Date:    Passive Exposure:    Counseling Given:    Comments:    Nicotine:    THC:    CBD:     Flavoring:    Other:        Current Outpatient Medications   Medication Sig Dispense Refill   . aspirin 81 mg PO CHEW Take 1 Tab by Mouth Once a Day. 30 Tab 1   . atorvastatin  (LIPITOR) 20 mg PO TABS Take 1 Tab  by Mouth Every Night at Bedtime. 30 Tab 1   . b complex-vitamin c-folic acid (NEPHRO/TRIPHROCAPS) 1 mg PO CAPS Take 1 Cap by Mouth Once a Day. 30 Cap 1   . calcitRIOL (ROCALTROL) 0.25 mcg PO CAPS Take 1 Cap by Mouth Once a Day. 30 Cap 0   . dulaglutide  (TRULICITY ) 0.75 mg/0.5 mL SC  Pen Injector Inject 0.5 mL beneath the skin Every 7 Days. Tuesday     . epoetin alfa, ESRD, (EPOGEN; PROCRIT) 4,000 unit/mL Inj SOLN Inject 1 mL beneath the skin mon-wed-fri at 2100.     SABRA epoetin alfa-epbx, ESRD, (RETACRIT) 10,000 unit/mL Inj SOLN Inject 0.4 mL beneath the skin Every Monday, Wednesday & Friday.     . ferrous sulfate (FEOSOL) 325 mg (65 mg Iron) PO TABS Take 1 Tab by Mouth Every Other Day. 30 Tab 0   . NIFEdipine  24HR (PROCARDIA  XL) 90 mg PO TR24 Take 1 Tab by Mouth Once a Day.     . oxyCODONE-acetaminophen  (PERCOCET) 5-325 mg PO TABS Take 1 Tab by Mouth Every 8 Hours As Needed for Moderate Pain (Pain Score 4-6). 20 Tab 0   . polyethylene glycol (MIRALAX) 17 gram PO PwPk Take 1 Packet by Mouth Once a Day.     . sevelamer  carbonate (RENVELA ) 800 mg PO TABS Take 1 Tab by Mouth Three Times Daily with Meals.     . sodium hypochlorite (DAKINS 1/4 STRENGTH) 0.125 % Misc SOLN Use 1 Application to affected area Q2D 0900. Indications: skin disinfection     . TYPE-IN DME Wound Care Orders    Start Aquacel Ag to the amputation site and heel ulcer left foot with 4x4's, 4 Kerlix and 4 ace wrap.  Change 3 times a week 1 Each 0     No current facility-administered medications for this visit.     Allergies   Allergen Reactions   . Naproxen anaphylaxis/angioedema       Review of Systems   Constitutional:  Negative for activity change, chills, diaphoresis and fever.   HENT:  Negative for dental problem, ear discharge, facial swelling, tinnitus and trouble swallowing.    Eyes:  Negative for discharge and visual disturbance.   Respiratory:  Negative for cough, choking and chest tightness.    Cardiovascular:  Negative for chest pain and leg swelling.   Gastrointestinal:  Negative for abdominal pain, constipation, nausea and vomiting.   Endocrine: Negative for polydipsia and polyuria.   Genitourinary:  Negative for dysuria, flank pain and hematuria.   Musculoskeletal:  Negative for gait problem, joint swelling and neck  pain.   Skin:  Negative for pallor and wound.   Allergic/Immunologic: Negative for immunocompromised state.   Neurological:  Negative for dizziness, seizures, speech difficulty, weakness, numbness and headaches.   Psychiatric/Behavioral:  Negative for behavioral problems, confusion, hallucinations and self-injury.        OBJECTIVE:  BP 150/58 (Site: Arm L, Position: In chair)   Ht 5' 4.96 (1.65 m)   Wt 76.7 kg (169 lb 3.2 oz)   BMI 28.19 kg/m  BMI: Data Unavailable    Physical Exam  HENT:      Head: Normocephalic and atraumatic.      Nose: Nose normal.   Eyes:      Extraocular Movements: Extraocular movements intact.   Cardiovascular:      Rate and Rhythm: Normal rate and regular rhythm.      Pulses:           Radial pulses are  2+ on the right side and 2+ on the left side.        Femoral pulses are 2+ on the right side and 2+ on the left side.       Popliteal pulses are 2+ on the right side and 2+ on the left side.        Dorsalis pedis pulses are detected w/ Doppler on the right side and detected w/ Doppler on the left side.        Posterior tibial pulses are detected w/ Doppler on the right side and detected w/ Doppler on the left side.   Pulmonary:      Effort: Pulmonary effort is normal.      Breath sounds: Normal breath sounds.   Abdominal:      Palpations: Abdomen is soft.   Musculoskeletal:         General: Normal range of motion.      Cervical back: Normal range of motion and neck supple.   Skin:     General: Skin is warm and dry.   Neurological:      General: No focal deficit present.      Mental Status: He is alert and oriented to person, place, and time.   Psychiatric:         Mood and Affect: Mood normal.       I have reviewed information entered by the clinical staff and/or patient and verified it as accurate or edited where necessary.

## 2023-04-16 ENCOUNTER — Encounter

## 2023-04-16 NOTE — Progress Notes (Signed)
 Referral placed, print and fax somewhere else    Tyson from [facility] called on behalf of the pt stating that the patient is being discharged from the agency due to some inappropriate comments to several nurses which is deemed for nurses are unable to return to pt's home. Pt was receiving  wound care pt will need  another home health agency or something of that nature pt is aware

## 2023-04-25 ENCOUNTER — Ambulatory Visit: Payer: MEDICARE | Attending: Internal Medicine | Primary: Internal Medicine

## 2023-04-26 NOTE — Progress Notes (Signed)
 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 Continue to off load heel  2.Chemical cauterization left heel ulcer using a silver nitrate.    3. Continue Aquacel Ag dressing changes 3 times a week  4. Patient to return in 1 month or sooner if any problems       Subjective: Hunter Higgins is post op 9 weeks.  Patient denies any fever, chills, nausea, vomiting.  Pain is 8/10 on VAS scale.  He is getting Aquacel Ag dressing changes 3 times a week.  Patient is ambulating with surgical shoe and walker    Objective:    Physical Exam:       Exam today reveals edema left foot. Dehiscence at the first ray amputation site left foot.  Does not probe to bone.  No drainage, no fluctuance, no crepitus.  There is an ulcer lateral fifth MPJ and posterior left heel with hypergranular base.  Does not probe to bone.SABRA          Tresea JUDITHANN Coe, DPM, FACFAS  [facility] [place]  04/26/2023 2:37 PM

## 2023-04-28 ENCOUNTER — Encounter

## 2023-04-28 MED ORDER — GABAPENTIN 300 MG PO CAPS
300 | ORAL_CAPSULE | Freq: Every day | ORAL | 1 refills | Status: DC
Start: 2023-04-28 — End: 2023-10-26

## 2023-05-18 ENCOUNTER — Ambulatory Visit: Payer: MEDICARE | Attending: Internal Medicine | Primary: Internal Medicine

## 2023-05-31 ENCOUNTER — Ambulatory Visit: Payer: MEDICARE | Attending: Internal Medicine | Primary: Internal Medicine

## 2023-06-01 ENCOUNTER — Ambulatory Visit: Payer: MEDICARE | Attending: Internal Medicine | Primary: Internal Medicine

## 2023-06-20 ENCOUNTER — Ambulatory Visit
Admit: 2023-06-20 | Discharge: 2023-06-20 | Payer: PRIVATE HEALTH INSURANCE | Attending: Internal Medicine | Primary: Internal Medicine

## 2023-06-20 ENCOUNTER — Inpatient Hospital Stay: Admit: 2023-06-20 | Payer: PRIVATE HEALTH INSURANCE | Primary: Internal Medicine

## 2023-06-20 DIAGNOSIS — E1169 Type 2 diabetes mellitus with other specified complication: Secondary | ICD-10-CM

## 2023-06-20 LAB — HEMOGLOBIN A1C
Estimated Avg Glucose: 177 mg/dL
Hemoglobin A1C: 7.8 % — ABNORMAL HIGH (ref 4.2–5.6)

## 2023-06-20 MED ORDER — TRAMADOL-ACETAMINOPHEN 37.5-325 MG PO TABS
ORAL_TABLET | Freq: Two times a day (BID) | ORAL | 0 refills | Status: AC | PRN
Start: 2023-06-20 — End: 2023-06-27

## 2023-06-20 NOTE — Progress Notes (Signed)
Subjective:      Patient ID: Hunter Higgins is a 72 y.o. male.    Follow-up    Patient coming for routine follow-up on chronic conditions.  Blood pressure is elevated, still acceptable.  Patient reported normal blood pressure after dialysis.  Nephrology stopped nifedipine due to concerns of soft blood pressures.  Complaining of left foot pain, patient was taking Percocet as needed once a week.  Would like to switch to Ultracet for this time.  Further refills by specialist.  Big mass in the left upper back, resolving increasing in size.  I recommended evaluation by surgery, patient and family agreed.  Referral sent.  Contact information provided.        Essential hyypertension  Nephrology stopped nifedipine according to patient due to soft BP.  According to the patient is controlled after dialysis sessions.    Physical deconditioning  Recommended PT OT, SN, Home health.  Wound care as follows: foot injury 3 times a week (since patient has dialysis sessions)    Diabetic foot s/p amputation left foot /bilateral shin skin injury  Following up with podiatry and wound care.    GERD  famotidine (PEPCID) 20 MG tablet, twice daily as needed    Diabetes mellitus type 2  Dulaglutide (TRULICITY) 0.75 MG/0.5ML SOPN, weekly.    Diabetic neuropathy  gabapentin (NEURONTIN) 300 MG capsule, Take 1 capsule by mouth 3 times daily.    ESRD  On dialysis 3 times a day.    OTC  aspirin 81 MG chewable tablet.    Diabetes  Pertinent negatives for diabetes include no chest pain.   Hyperlipidemia  Pertinent negatives include no chest pain or shortness of breath.   Hypertension  Pertinent negatives include no chest pain or shortness of breath.   Wound Check    Eye Pain   Pertinent negatives include no fever.   Fall  Pertinent negatives include no abdominal pain or fever.   Shortness of Breath  Pertinent negatives include no abdominal pain, chest pain or fever.       Review of Systems   Constitutional:  Negative for fever.   Respiratory:   Negative for shortness of breath.    Cardiovascular:  Negative for chest pain.   Gastrointestinal:  Negative for abdominal pain.       Objective:   Visit Vitals  BP (!) 148/78 (Site: Left Upper Arm, Position: Sitting)   Pulse 68   Temp 97.1 F (36.2 C) (Temporal)   Resp 16   Ht 1.702 m (5\' 7" )   Wt 77.1 kg (170 lb)   SpO2 97%   BMI 26.63 kg/m        Physical Exam  Cardiovascular:      Rate and Rhythm: Normal rate and regular rhythm.      Heart sounds: S1 normal and S2 normal.      No S3 or S4 sounds.   Pulmonary:      Effort: Pulmonary effort is normal.      Breath sounds: Normal breath sounds.      Comments: No crackles or wheezing.  Abdominal:      General: Abdomen is flat. Bowel sounds are normal.   Neurological:      Mental Status: He is alert.         Assessment:       ICD-10-CM    1. Type 2 diabetes mellitus with other specified complication, with long-term current use of insulin (HCC)  E11.69 Hemoglobin A1C    Z79.4  2. Encounter for hepatitis C screening test for low risk patient  Z11.59 Hepatitis C Ab, Rflx to Qt by PCR      3. Screening for colon cancer  Z12.11 Cologuard (Fecal DNA Colorectal Cancer Screening)      4. Mass on back  R22.2 [facility] - Leonel Ramsay, MD, General Surgery, [facility])      5. Left foot pain  M79.672 traMADol-acetaminophen (ULTRACET) 37.5-325 MG per tablet              Plan:   Return in about 3 months (around 09/15/2023) for Follow up, Treatment monitoring.      Erven Colla, MD

## 2023-06-21 LAB — HEPATITIS C AB, RFLX TO QT BY PCR: Hepatitis C Ab: NONREACTIVE {s_co_ratio}

## 2023-06-21 LAB — HCV INTERPRETATION

## 2023-08-01 ENCOUNTER — Encounter: Admit: 2023-08-01 | Payer: MEDICARE | Admitting: Surgery | Primary: Internal Medicine

## 2023-08-01 VITALS — BP 111/64 | HR 84 | Temp 98.70000°F | Ht 67.0 in | Wt 168.0 lb

## 2023-08-01 DIAGNOSIS — R221 Localized swelling, mass and lump, neck: Principal | ICD-10-CM

## 2023-08-01 NOTE — Progress Notes (Signed)
 Hunter Higgins is a 72 y.o. male (DOB: [birth year]) presenting to address:    Chief Complaint   Patient presents with    Follow-up     Mass of mid upper back/observation       Medication list and allergies have been reviewed with [name] and updated a

## 2023-08-01 NOTE — Progress Notes (Signed)
 General Surgery Consult    Hunter Higgins  Admit date: (Not on file)    MRN: [id number]     DOB: [birth year]     Age: 72 y.o.        Attending Physician: Bradly Chris, MD, FACS      History of Present Illness:      Hunter Higgins is a 72 y.o. male who is

## 2023-08-22 ENCOUNTER — Ambulatory Visit: Admit: 2023-08-22 | Payer: MEDICARE | Admitting: Internal Medicine | Primary: Internal Medicine

## 2023-08-22 VITALS — BP 164/90 | HR 82 | Temp 97.70000°F | Resp 16 | Ht 67.0 in | Wt 171.0 lb

## 2023-08-22 DIAGNOSIS — I10 Essential (primary) hypertension: Secondary | ICD-10-CM

## 2023-08-22 LAB — AMB POC HEMOGLOBIN A1C: Hemoglobin A1C, POC: 7.8 %

## 2023-08-22 MED ORDER — LOSARTAN POTASSIUM 25 MG PO TABS
25 | ORAL_TABLET | ORAL | 1 refills | Status: DC
Start: 2023-08-22 — End: 2023-10-26

## 2023-08-22 NOTE — Progress Notes (Signed)
 Subjective:      Patient ID: Hunter Higgins is a 73 y.o. male.    Follow-up    The patient is elevated.  He went to the hospital few weeks ago noted high blood pressure.  Your lisinopril 5 mg blood pressure improved.  Patient reported normal blood pressure after dialysis.  Nephrology stopped nifedipine  due to concerns of soft blood pressures.  Added losartan  25 mg on the days that he does not have dialysis after checking blood pressure, day 1 tablet if blood pressure is >140/90.  To monitor blood pressures every morning.  He is complaining of decreased acuity of vision.  Will send ophthalmology referral.      Essential hyypertension  Nephrology stopped nifedipine  according to patient due to soft BP.  According to the patient is controlled after dialysis sessions.    Physical deconditioning  Recommended PT OT, SN, Home health.  Wound care as follows: foot injury 3 times a week (since patient has dialysis sessions)    Diabetic foot s/p amputation left foot /bilateral shin skin injury  Following up with podiatry and wound care.    GERD  famotidine (PEPCID) 20 MG tablet, twice daily as needed    Diabetes mellitus type 2  Dulaglutide  (TRULICITY ) 0.75 MG/0.5ML SOPN, weekly.    Diabetic neuropathy  gabapentin  (NEURONTIN ) 300 MG capsule, Take 1 capsule by mouth 3 times daily.    ESRD  On dialysis 3 times a day.    OTC  aspirin 81 MG chewable tablet.    Hypertension  Pertinent negatives include no chest pain or shortness of breath.   Diabetes  Pertinent negatives for diabetes include no chest pain.   Gastroesophageal Reflux  He reports no abdominal pain or no chest pain.   Hyperlipidemia  Pertinent negatives include no chest pain or shortness of breath.   Wound Check    Eye Pain   Pertinent negatives include no fever.   Fall  Pertinent negatives include no abdominal pain or fever.   Shortness of Breath  Pertinent negatives include no abdominal pain, chest pain or fever.       Review of Systems   Constitutional:  Negative for  fever.   Respiratory:  Negative for shortness of breath.    Cardiovascular:  Negative for chest pain.   Gastrointestinal:  Negative for abdominal pain.       Objective:   Visit Vitals  BP (!) 164/90 (Site: Left Upper Arm, Position: Sitting)   Pulse 82   Temp 97.7 F (36.5 C) (Temporal)   Resp 16   Ht 1.702 m (5' 7)   Wt 77.6 kg (171 lb)   BMI 26.78 kg/m        Physical Exam  Cardiovascular:      Rate and Rhythm: Normal rate and regular rhythm.      Heart sounds: S1 normal and S2 normal.      No S3 or S4 sounds.   Pulmonary:      Effort: Pulmonary effort is normal.      Breath sounds: Normal breath sounds.      Comments: No crackles or wheezing.  Abdominal:      General: Abdomen is flat. Bowel sounds are normal.   Neurological:      Mental Status: He is alert.         Assessment:       ICD-10-CM    1. Essential hypertension  I10 losartan  (COZAAR ) 25 MG tablet      2. Decreased visual  acuity  H54.7 External Referral To Ophthalmology      3. Type 2 diabetes mellitus with other specified complication, with long-term current use of insulin (HCC)  E11.69 AMB POC HEMOGLOBIN A1C    Z79.4               Plan:   Return in about 3 months (around 11/20/2023) for Treatment monitoring BP, A1c.      Hunter CHRISTELLA Andrez Sharyl, MD

## 2023-09-18 ENCOUNTER — Encounter

## 2023-09-18 NOTE — Telephone Encounter (Signed)
Last Appointment:  08/22/2023  Future Appointments   Date Time Provider Department Center   09/28/2023 10:15 AM Erven Colla, MD G[facility] DEP

## 2023-09-18 NOTE — Telephone Encounter (Signed)
Pt called stating pharmacy states Trulicity is expired and need new script sent in.

## 2023-09-19 MED ORDER — DULAGLUTIDE 0.75 MG/0.5ML SC SOAJ
0.750.5 | SUBCUTANEOUS | 2 refills | Status: AC
Start: 2023-09-19 — End: ?

## 2023-09-28 ENCOUNTER — Ambulatory Visit: Payer: MEDICARE | Attending: Internal Medicine | Primary: Internal Medicine

## 2023-10-05 ENCOUNTER — Ambulatory Visit: Payer: MEDICARE | Attending: Internal Medicine | Primary: Internal Medicine

## 2023-10-26 ENCOUNTER — Ambulatory Visit: Admit: 2023-10-26 | Discharge: 2023-10-26 | Payer: MEDICARE | Attending: Internal Medicine | Primary: Internal Medicine

## 2023-10-26 VITALS — BP 144/74 | HR 67 | Temp 97.10000°F | Resp 16 | Ht 66.0 in | Wt 171.0 lb

## 2023-10-26 DIAGNOSIS — G8929 Other chronic pain: Secondary | ICD-10-CM

## 2023-10-26 MED ORDER — DULAGLUTIDE 0.75 MG/0.5ML SC SOAJ
0.75 | SUBCUTANEOUS | 5 refills | 28.00000 days | Status: DC
Start: 2023-10-26 — End: 2024-03-21

## 2023-10-26 MED ORDER — GABAPENTIN 300 MG PO CAPS
300 | ORAL_CAPSULE | Freq: Every day | ORAL | 1 refills | Status: DC
Start: 2023-10-26 — End: 2024-03-21

## 2023-10-26 MED ORDER — CYCLOBENZAPRINE HCL 5 MG PO TABS
5 | ORAL_TABLET | Freq: Every evening | ORAL | 0 refills | Status: AC | PRN
Start: 2023-10-26 — End: 2023-11-05

## 2023-10-26 NOTE — Progress Notes (Signed)
 Subjective:      Patient ID: Hunter Higgins is a 73 y.o. male.    Follow-up    Patient came for routine follow-up for diabetes.  A1c POC 7.2, congratulated.  Will continue Trulicity for now.  According to the patient, losartan and statin were stopped in dialysis center.  Blood pressure seems to be controlled after dialysis sessions but elevated in between dialysis.  He is complaining of low back right pain, noted muscle spasm.  Continue Flexeril nightly as needed muscle spasm for a few nights.  Patient was instructed not to drive, drink alcohol or do other risk activities while taking this medication since it can cause drowsiness and sleepiness in order to avoid accidents.  Patient understood and agreed with the plan.  Recommended heating pads.  His left foot ulcer is improving, he is going to wound care clinic at [facility] twice a week.  No other complaints or concerns.      Essential hyypertension  Nephrology stopped - nifedipine according to patient due to soft BP.  Nephrology stopped-losartan 25 mg on the days that he does not have dialysis after checking blood pressure, day 1 tablet if blood pressure is >140/90.  Blood pressure seems to be controlled after dialysis sessions but elevated in between dialysis.    Physical deconditioning  Recommended PT OT, SN, Home health.  Wound care as follows: foot injury 3 times a week (since patient has dialysis sessions)    Diabetic foot s/p amputation left foot /bilateral shin skin injury  Following up with podiatry and wound care.    GERD  famotidine (PEPCID) 20 MG tablet, twice daily as needed    Diabetes mellitus type 2  Dulaglutide (TRULICITY) 0.75 MG/0.5ML SOPN, weekly.  Nephrology stopped - atorvastatin 40 mg daily    Diabetic neuropathy  gabapentin (NEURONTIN) 300 MG capsule, Take 1 capsule by mouth 3 times daily.    ESRD  On dialysis 3 times a day.    OTC  aspirin 81 MG chewable tablet.    Hypertension  Pertinent negatives include no chest pain or shortness of breath.    Diabetes  Pertinent negatives for diabetes include no chest pain.   Gastroesophageal Reflux  He reports no abdominal pain or no chest pain.   Hyperlipidemia  Pertinent negatives include no chest pain or shortness of breath.   Wound Check    Eye Pain   Pertinent negatives include no fever.   Fall  Pertinent negatives include no abdominal pain or fever.   Shortness of Breath  Pertinent negatives include no abdominal pain, chest pain or fever.       Review of Systems   Constitutional:  Negative for fever.   Respiratory:  Negative for shortness of breath.    Cardiovascular:  Negative for chest pain.   Gastrointestinal:  Negative for abdominal pain.       Objective:   Visit Vitals  BP (!) 144/74 (BP Site: Left Upper Arm, Patient Position: Sitting)   Pulse 67   Temp 97.1 F (36.2 C) (Temporal)   Resp 16   Ht 1.676 m (5\' 6" )   Wt 77.6 kg (171 lb)   SpO2 93%   BMI 27.60 kg/m        Physical Exam  Cardiovascular:      Rate and Rhythm: Normal rate and regular rhythm.      Heart sounds: S1 normal and S2 normal.      No S3 or S4 sounds.   Pulmonary:  Effort: Pulmonary effort is normal.      Breath sounds: Normal breath sounds.      Comments: No crackles or wheezing.  Abdominal:      General: Abdomen is flat. Bowel sounds are normal.   Musculoskeletal:      Right lower leg: No edema.      Left lower leg: No edema.   Neurological:      Mental Status: He is alert.         Assessment:       ICD-10-CM    1. Other chronic pain  G89.29 gabapentin (NEURONTIN) 300 MG capsule      2. Pressure injury of left heel, stage 2 (HCC)  L89.622       3. End-stage renal disease (ESRD) (HCC)  N18.6       4. Screening for colon cancer  Z12.11 Cologuard (Fecal DNA Colorectal Cancer Screening)      5. Type 2 diabetes mellitus with other specified complication, with long-term current use of insulin (HCC)  E11.69 dulaglutide (TRULICITY) 0.75 MG/0.5ML SOAJ SC injection    Z79.4       6. Muscle spasm  M62.838 cyclobenzaprine (FLEXERIL) 5 MG tablet               Plan:   Return in about 6 months (around 04/27/2024) for Medicare Annual exam and HM.[name]      Erven Colla, MD

## 2024-03-21 ENCOUNTER — Inpatient Hospital Stay: Admit: 2024-03-21 | Payer: Medicare (Managed Care) | Primary: Internal Medicine

## 2024-03-21 ENCOUNTER — Ambulatory Visit
Admit: 2024-03-21 | Discharge: 2024-03-21 | Payer: Medicaid (Managed Care) | Attending: Internal Medicine | Primary: Internal Medicine

## 2024-03-21 DIAGNOSIS — E1169 Type 2 diabetes mellitus with other specified complication: Principal | ICD-10-CM

## 2024-03-21 DIAGNOSIS — Z0000 Encounter for general adult medical examination without abnormal findings: Principal | ICD-10-CM

## 2024-03-21 LAB — COMPREHENSIVE METABOLIC PANEL
ALT: 30 U/L (ref 10–50)
AST: 22 U/L (ref 10–38)
Albumin/Globulin Ratio: 0.8 (ref 0.8–1.7)
Albumin: 3.4 g/dL (ref 3.4–5.0)
Alk Phosphatase: 142 U/L — ABNORMAL HIGH (ref 45–117)
Anion Gap: 15 mmol/L (ref 3.0–18.0)
BUN/Creatinine Ratio: 5 — ABNORMAL LOW (ref 12–20)
BUN: 42 mg/dL — ABNORMAL HIGH (ref 6–23)
CO2: 28 mmol/L (ref 21–32)
Calcium: 10.2 mg/dL — ABNORMAL HIGH (ref 8.5–10.1)
Chloride: 98 mmol/L (ref 98–107)
Creatinine: 7.81 mg/dL — ABNORMAL HIGH (ref 0.6–1.3)
Est, Glom Filt Rate: 7 ml/min/1.73m2 — ABNORMAL LOW (ref >60)
Globulin: 4 g/dL (ref 2.0–4.0)
Glucose: 108 mg/dL (ref 74–108)
Potassium: 5 mmol/L (ref 3.5–5.5)
Sodium: 141 mmol/L (ref 136–145)
Total Bilirubin: 0.6 mg/dL (ref 0.2–1.0)
Total Protein: 7.3 g/dL (ref 6.4–8.2)

## 2024-03-21 LAB — HEMOGLOBIN A1C
Estimated Avg Glucose: 146 mg/dL
Hemoglobin A1C: 6.7 % — ABNORMAL HIGH (ref 4.2–5.6)

## 2024-03-21 LAB — LIPID PANEL
Chol/HDL Ratio: 2.4 (ref 0–5.0)
Cholesterol, Total: 126 mg/dL
HDL: 52 mg/dL (ref 40–60)
LDL Cholesterol: 58 mg/dL (ref 0–100)
Triglycerides: 83 mg/dL (ref 0–150)
VLDL Cholesterol Calculated: 17 mg/dL

## 2024-03-21 MED ORDER — GABAPENTIN 300 MG PO CAPS
300 | ORAL_CAPSULE | Freq: Every day | ORAL | 1 refills | 30.00000 days | Status: AC
Start: 2024-03-21 — End: 2024-09-17

## 2024-03-21 MED ORDER — DULAGLUTIDE 0.75 MG/0.5ML SC SOAJ
0.75 | SUBCUTANEOUS | 5 refills | 28.00000 days | Status: DC
Start: 2024-03-21 — End: 2024-05-28

## 2024-03-21 NOTE — Progress Notes (Signed)
 Medicare Annual Wellness Visit    Hunter Higgins is here for Medicare AWV    Assessment & Plan   Medicare annual wellness visit, subsequent  ACP (advance care planning)  -     PR Advanced Care Planning (16-30 minutes) [00502]     No follow-ups on file.     Subjective       Patient's complete Health Risk Assessment and screening values have been reviewed and are found in Flowsheets. The following problems were reviewed today and where indicated follow up appointments were made and/or referrals ordered.    Positive Risk Factor Screenings with Interventions:              Inactivity:  On average, how many days per week do you engage in moderate to strenuous exercise (like a brisk walk)?: 0 days (!) Abnormal  On average, how many minutes do you engage in exercise at this level?: 0 min  Interventions:  See AVS for additional education material    Poor Eating Habits/Diet:  Do you eat balanced/healthy meals regularly?: (!) No  Interventions:  See AVS for additional education material     Dentist Screen:  Have you seen the dentist within the past year?: (!) No    Intervention:  See AVS for additional education material        ADL's:   Patient reports needing help with:  Select all that apply: (!) Eating, Walking/Balance  Select all that apply: (!) Taking Medications, Transportation, Presenter, broadcasting, Telephone Use, Shopping, Banking/Finances, Stage manager, Laundry  Interventions:  See AVS for additional education material    Advanced Directives:  Do you have a Living Will?: (!) No    Intervention:  has NO advanced directive - information provided    Advance Care Planning   Discussed the patient's choices for care and treatment preferences in case of a health event that adversely affects decision-making abilities or is life-limiting. Recommended the patient document care preferences in state-specific advance directives. Also reviewed the process of designating a trusted capable adult as an Agent (or Health Care Power of  [name]) to make health care decisions for the patient if the patient becomes unable due to incapacity. Patient was asked to complete advance directive forms, if not already done, and to provide a dated, signed and witnessed (or notarized) copy, per the forms' requirements, to the practice office.    Time spent (minutes): 16 minutes                    Objective   Vitals:    03/21/24 1349   BP: (!) 148/72   BP Cuff Size: Small Adult   Pulse: 72   Resp: 16   Temp: 97.3 F (36.3 C)   TempSrc: Temporal   SpO2: 98%   Weight: 77.2 kg (170 lb 3.2 oz)   Height: 1.676 m (5' 6)      Body mass index is 27.47 kg/m.                    Allergies   Allergen Reactions    Ace Inhibitors Cough, Itching and Other (See Comments)     cough  Other reaction(s): Cough  cough  cough    Naproxen Anaphylaxis, Shortness Of Breath and Swelling     Other reaction(s): gi distress, Other, Other (See Comments)  Throat swells. cannot breathe, and causes GI distress  REACTION: Throat swells and cannot breathe  anaphylaxis  REACTION: Throat swells and cannot breathe  anaphylaxis  Throat swells. cannot breathe, and causes GI distress     Prior to Visit Medications    Medication Sig Taking? Authorizing Provider   traZODone  (DESYREL ) 50 MG tablet Take 1 tablet by mouth nightly Yes [provider]   sulfamethoxazole-trimethoprim (BACTRIM DS;SEPTRA DS) 800-160 MG per tablet Take 1 tablet by mouth once Yes [provider]   cyclobenzaprine  (FLEXERIL ) 5 MG tablet Take 1 tablet by mouth 3 times daily Yes [provider]   Multiple Vitamins-Minerals (ALIVE MENS 50+ PO) Take by mouth daily Yes [provider]   aspirin 81 MG EC tablet Take 1 tablet by mouth daily Yes [provider]   gabapentin  (NEURONTIN ) 300 MG capsule Take 1 capsule by mouth daily for 180 days. Max Daily Amount: 300 mg Yes Chavez Andia, Irini Leet M, MD   dulaglutide  (TRULICITY ) 0.75 MG/0.5ML SOAJ SC injection Inject 0.5 mLs into the skin once a  week Yes Chavez Andia, Toribio HERO, MD   sevelamer  (RENVELA ) 800 MG tablet Take 1 tablet by mouth 3 times daily (with meals) Yes Andrez Pigg, Toribio HERO, MD   blood glucose test strips (ASCENSIA AUTODISC VI;ONE TOUCH ULTRA TEST VI) strip To check sugars/glucose 3 times a day. Dx. DM-II. Yes Andrez Pigg Toribio HERO, MD   Lancets 33G MISC To check glucose level 3 times a day. Dx: DM-II Yes Andrez Pigg Toribio HERO, MD   calcitRIOL (ROCALTROL) 0.25 MCG capsule Take 1 capsule by mouth daily Yes [provider]   blood glucose monitor strips Test 2 times a day & as needed for symptoms of irregular blood glucose. Dispense sufficient amount for indicated testing frequency plus additional to accommodate PRN testing needs. Yes Andrez Pigg Toribio HERO, MD   levoFLOXacin (LEVAQUIN) 500 MG tablet Take 1 tablet by mouth daily  [provider]   doxycycline hyclate (VIBRAMYCIN) 100 MG capsule Take 1 capsule by mouth 2 times daily  [provider]   amoxicillin-clavulanate (AUGMENTIN) 875-125 MG per tablet Take 1 tablet by mouth 2 times daily  [provider]   acetaminophen  (TYLENOL ) 500 MG tablet Take 1 tablet by mouth every 6 hours as needed for Pain  Patient not taking: Reported on 03/21/2024  [provider]   calcium  acetate 667 MG TABS Take by mouth  Patient not taking: Reported on 03/21/2024  [provider]   losartan  (COZAAR ) 25 MG tablet Take 1 tablet by mouth daily  Patient not taking: Reported on 03/21/2024  [provider]   ferrous sulfate (IRON 325) 325 (65 Fe) MG tablet Take 1 tablet by mouth every other day  [provider]   glucose monitoring kit 1 kit by Does not apply route daily Check blood sugars twice a day and as needed. Dx: DM-II  Patient not taking: Reported on 10/26/2023  Andrez Pigg, Toribio HERO, MD       CareTeam (Including outside providers/suppliers regularly involved in providing care):   Patient Care Team:  Andrez Pigg, Toribio HERO, MD as PCP -  General (Internal Medicine)  Andrez Pigg, Toribio HERO, MD as PCP - Empaneled Provider  Rabie, Franky BIRCH, DPM as Physician  Manson Hartshorn, MD as Consulting Physician     Recommendations for Preventive Services Due: see orders and patient instructions/AVS.  Recommended screening schedule for the next 5-10 years is provided to the patient in written form: see Patient Instructions/AVS.     Reviewed and updated this visit:  Tobacco  Allergies  Meds  Problems  Med Hx  Surg Hx  Fam Hx  Sexual   Hx

## 2024-03-21 NOTE — Patient Instructions (Signed)
 Learning About Emotional Support  When do you need emotional support?     You might find getting support from others helpful when you have a long-term health problem. Often people feel alone, confused, or scared when coping with an illness. But you aren't alone. Other people are going through the same thing you are and know how you feel.  Talking with others about your feelings can help you feel better.  Your family and friends can give you support. So can your doctor, a support group, or a church. If you have a support network, you will not feel as alone. You will learn new ways to deal with your situation, and you may try harder to overcome it.  Where you can get support  Family and friends: They can help you cope by giving you comfort and encouragement.  Counseling: Professional counseling can help you cope with situations that interfere with your life and cause stress. Counseling can help you understand and deal with your illness.  Your doctor: Find a doctor you trust and feel comfortable with. Be open and honest about your fears and concerns. Your doctor can help you get the right medical treatments, including counseling.  Spiritual or religious groups: They can provide comfort and may be able to help you find counseling or other social support services.  Social groups: They can help you meet new people and get involved in activities you enjoy.  Community support groups: In a support group, you can talk to others who have dealt with the same problems or illness as you. You can encourage one another and learn ways to cope with tough emotions.  How can you find a support group?  Finding a support group that works for you may take time. There are many options. Some groups have a group leader who helps lead discussions or shares information. Others are less formal. Some meet in person, while others meet online.  Try using these resources to help you find the best support group for you.  Your doctor, health  care team, or counselor.  People with the same health concern.  Your local church, mosque, synagogue, or other religious group.  A city, state, or national group that provides support for your health concern. Check your local library or community center for a list of these groups. Or look for information online.  Your local community, friends, and family.  Supportive relationships  A supportive relationship includes emotional support such as love, trust, and understanding, as well as advice and concrete help, such as help managing your time.  Reach out to others  Family and friends can help you. Ask them to:  Listen to you and give you encouragement. This can keep you from feeling hopeless or alone.  Help with small daily tasks or with bigger problems. A helping hand can keep you from feeling overwhelmed.  Help you manage a health problem. For example, ask them to go to doctor visits with you. Your loved ones can offer support by being involved in your medical care.  Respect your relationships  A good relationship is also a two-way street. You count on help from others, but they also count on you.  Know your friends' limits. You don't have to see or call your friends every day. If you are going through a rough patch, ask friends if you can contact them outside of the usual boundaries.  Don't always complain or talk about yourself. Know when it's time to stop talking and listen or  just enjoy your friend's company.  Know that good friends can be a bad influence. For example, if a friend encourages you to drink when you know it will harm you, you may want to end the friendship.  Where can you learn more?  Go to [employer] and enter G092 to learn more about Learning About Emotional Support.  Current as of: March 15, 2023  Content Version: 14.5   [address] [place].   Care instructions adapted under license by [facility] If you have questions about a medical condition or this  instruction, always ask your healthcare professional. Romayne Alderman, [facility], disclaims any warranty or liability for your use of this information.         Learning About Being Active as an Older Adult  Why is being active important as you get older?     Being active is one of the best things you can do for your health. And it's never too late to start. Being active--or getting active, if you aren't already--has definite benefits. It can:  Give you more energy,  Keep your mind sharp.  Improve balance to reduce your risk of falls.  Help you manage chronic illness with fewer medicines.  No matter how old you are, how fit you are, or what health problems you have, there is a form of activity that will work for you. And the more physical activity you can do, the better your overall health will be.  What kinds of activity can help you stay healthy?  Being more active will make your daily activities easier. Physical activity includes planned exercise and things you do in daily life. There are four types of activity:  Aerobic.  Doing aerobic activity makes your heart and lungs strong.  Includes walking, dancing, and gardening.  Aim for at least 2 hours spread throughout the week.  It improves your energy and can help you sleep better.  Muscle-strengthening.  This type of activity can help maintain muscle and strengthen bones.  Includes climbing stairs, using resistance bands, and lifting or carrying heavy loads.  Aim for at least twice a week.  It can help protect the knees and other joints.  Stretching.  Stretching gives you better range of motion in joints and muscles.  Includes upper arm stretches, calf stretches, and gentle yoga.  Aim for at least twice a week, preferably after your muscles are warmed up from other activities.  It can help you function better in daily life.  Balancing.  This helps you stay coordinated and have good posture.  Includes heel-to-toe walking, tai chi, and certain types of yoga.  Aim for at  least 3 days a week.  It can reduce your risk of falling.  Even if you have a hard time meeting the recommendations, it's better to be more active than less active. All activity done in each category counts toward your weekly total. You'd be surprised how daily things like carrying groceries, keeping up with grandchildren, and taking the stairs can add up.  What keeps you from being active?  If you've had a hard time being more active, you're not alone. Maybe you remember being able to do more. Or maybe you've never thought of yourself as being active. It's frustrating when you can't do the things you want. Being more active can help. What's holding you back?  Getting started.  Have a goal, but break it into easy tasks. Small steps build into big accomplishments.  Staying motivated.  If you  feel like skipping your activity, remember your goal. Maybe you want to move better and stay independent. Every activity gets you one step closer.  Not feeling your best.  Start with 5 minutes of an activity you enjoy. Prove to yourself you can do it. As you get comfortable, increase your time.  You may not be where you want to be. But you're in the process of getting there. Everyone starts somewhere.  How can you find safe ways to stay active?  Talk with your doctor about any physical challenges you're facing. Make a plan with your doctor if you have a health problem or aren't sure how to get started with activity.  If you're already active, ask your doctor if there is anything you should change to stay safe as your body and health change.  If you tend to feel dizzy after you take medicine, avoid activity at that time. Try being active before you take your medicine. This will reduce your risk of falls.  If you plan to be active at home, make sure to clear your space before you get started. Remove things like TV cords, coffee tables, and throw rugs. It's safest to have plenty of space to move freely.  The key to getting more  active is to take it slow and steady. Try to improve only a little bit at a time. Pick just one area to improve on at first. And if an activity hurts, stop and talk to your doctor.  Where can you learn more?  Go to [employer] and enter P600 to learn more about Learning About Being Active as an Older Adult.  Current as of: March 15, 2023  Content Version: 14.5   [address], [place].   Care instructions adapted under license by [facility]. If you have questions about a medical condition or this instruction, always ask your healthcare professional. Romayne Alderman, [facility], disclaims any warranty or liability for your use of this information.         Learning About Dental Care for Older Adults  Dental care for older adults: Overview  Dental care for older people is much the same as for younger adults. But older adults do have concerns that younger adults do not. Older adults may have problems with gum disease and decay on the roots of their teeth. They may need missing teeth replaced or broken fillings fixed. Or they may have dentures that need to be cared for. Some older adults may have trouble holding a toothbrush.  You can help remind the person you are caring for to brush and floss their teeth or to clean their dentures. In some cases, you may need to do the brushing and other dental care tasks. People who have trouble using their hands or who have dementia may need this extra help.  How can you help with dental care?  Normal dental care  To keep the teeth and gums healthy:  Brush the teeth with fluoride toothpaste twice a day--in the morning and at night--and floss at least once a day. Plaque can quickly build up on the teeth of older adults.  Watch for the signs of gum disease. These signs include gums that bleed after brushing or after eating hard foods, such as apples.  See a dentist regularly. Many experts recommend checkups every 6 months.  Keep the dentist up to date  on any new medications the person is taking.  Encourage a balanced diet that includes whole grains, vegetables, and fruits, and  that is low in saturated fat and sodium.  Encourage the person you're caring for not to use tobacco products. They can affect dental and general health.  Many older adults have a fixed income and feel that they can't afford dental care. But most towns and cities have programs in which dentists help older adults by lowering fees. Contact your area's public health offices or social services for information about dental care in your area.  Using a toothbrush  Older adults with arthritis sometimes have trouble brushing their teeth because they can't easily hold the toothbrush. Their hands and fingers may be stiff, painful, or weak. If this is the case, you can:  Offer an Mining engineer toothbrush.  Enlarge the handle of a non-electric toothbrush by wrapping a sponge, an elastic bandage, or adhesive tape around it.  Push the toothbrush handle through a ball made of rubber or soft foam.  Make the handle longer and thicker by taping Popsicle sticks or tongue depressors to it.  You may also be able to buy special toothbrushes, toothpaste dispensers, and floss holders.  Your doctor may recommend a soft-bristle toothbrush if the person you care for bleeds easily. Bleeding can happen because of a health problem or from certain medicines.  A toothpaste for sensitive teeth may help if the person you care for has sensitive teeth.  How do you brush and floss someone's teeth?  If the person you are caring for has a hard time cleaning their teeth on their own, you may need to brush and floss their teeth for them. It may be easiest to have the person sit and face away from you, and to sit or stand behind them. That way you can steady their head against your arm as you reach around to floss and brush their teeth. Choose a place that has good lighting and is comfortable for both of you.  Before you begin, gather  your supplies. You will need gloves, floss, a toothbrush, and a container to hold water if you are not near a sink. Wash and dry your hands well and put on gloves. Start by flossing:  Gently work a piece of floss between each of the teeth toward the gums. A plastic flossing tool may make this easier, and they are available at most drugstores.  Curve the floss around each tooth into a U-shape and gently slide it under the gum line.  Move the floss firmly up and down several times to scrape off the plaque.  After you've finished flossing, throw away the used floss and begin brushing:  Wet the brush and apply toothpaste.  Place the brush at a 45-degree angle where the teeth meet the gums. Press firmly, and move the brush in small circles over the surface of the teeth.  Be careful not to brush too hard. Vigorous brushing can make the gums pull away from the teeth and can scratch the tooth enamel.  Brush all surfaces of the teeth, on the tongue side and on the cheek side. Pay special attention to the front teeth and all surfaces of the back teeth.  Brush chewing surfaces with short back-and-forth strokes.  After you've finished, help the person rinse the remaining toothpaste from their mouth.  Where can you learn more?  Go to [employer] and enter F944 to learn more about Learning About Dental Care for Older Adults.  Current as of: March 15, 2023  Content Version: 14.5   [address] [place].   Care instructions adapted under  license by [employer]. If you have questions about a medical condition or this instruction, always ask your healthcare professional. Romayne Alderman, [facility], disclaims any warranty or liability for your use of this information.         Learning About Activities of Daily Living  What are activities of daily living?     Activities of daily living (ADLs) are the basic self-care tasks you do every day. These include eating, bathing, dressing, and moving  around.  As you age, and if you have health problems, you may find that it's harder to do some of these tasks. If so, your doctor can suggest ideas that may help.  To measure what kind of help you may need, your doctor will ask how well you are able to do ADLs. Let your doctor know if there are any tasks that you are having trouble doing. This is an important first step to getting help. And when you have the help you need, you can stay as independent as possible.  How will a doctor assess your ADLs?  Asking about ADLs is part of a routine health checkup your doctor will likely do as you age. Your health check might be done in a doctor's office, in your home, or at a hospital. The goal is to find out if you are having any problems that could make it hard to care for yourself or that make it unsafe for you to be on your own.  To measure your ADLs, your doctor will ask how hard it is for you to do routine tasks. Your doctor may also want to know if you have changed the way you do a task because of a health problem. Your doctor may watch how you:  Walk back and forth.  Keep your balance while you stand or walk.  Move from sitting to standing or from a bed to a chair.  Button or unbutton a Civil Service fast streamer.  Remove and put on your shoes.  It's common to feel a little worried or anxious if you find you can't do all the things you used to be able to do. Talking with your doctor about ADLs is a way to make sure you're as safe as possible and able to care for yourself as well as you can. You may want to bring a caregiver, friend, or family member to your checkup. They can help you talk to your doctor.  Follow-up care is a key part of your treatment and safety. Be sure to make and go to all appointments, and call your doctor if you are having problems. It's also a good idea to know your test results and keep a list of the medicines you take.  Current as of: June 08, 2023  Content Version: 14.5   [address], [place].   Care instructions adapted under license by [facility]. If you have questions about a medical condition or this instruction, always ask your healthcare professional. Romayne Alderman, [facility], disclaims any warranty or liability for your use of this information.         Eating Healthy Foods: Care Instructions  With every meal, you can make healthy food choices. Try to eat a variety of fruits, vegetables, whole grains, lean proteins, and low-fat dairy products. This can help you get the right balance of nutrients, including vitamins and minerals. Small changes add up over time. You can start by adding one healthy food to your meals each day.    Try  to make half your plate fruits and vegetables, one-fourth whole grains, and one-fourth lean proteins. Try including dairy with your meals.   Eat more fruits and vegetables. Try to have them with most meals and snacks.   Foods for healthy eating        Fruits   These can be fresh, frozen, canned, or dried.  Try to choose whole fruit rather than fruit juice.  Eat a variety of colors.        Vegetables   These can be fresh, frozen, canned, or dried.  Beans, peas, and lentils count too.        Whole grains   Choose whole-grain breads, cereals, and noodles.  Try brown rice.        Lean proteins   These can include lean meat, poultry, fish, and eggs.  You can also have tofu, beans, peas, lentils, nuts, and seeds.        Dairy   Try milk, yogurt, and cheese.  Choose low-fat or fat-free when you can.  If you need to, use lactose-free milk or fortified plant-based milk products, such as soy milk.        Water   Drink water when you're thirsty.  Limit sugar-sweetened drinks, including soda, fruit drinks, and sports drinks.  Where can you learn more?  Go to [employer] and enter T756 to learn more about Eating Healthy Foods: Care Instructions.  Current as of: May 22, 2023  Content Version: 14.5   [address], [place].   Care  instructions adapted under license by [facility]. If you have questions about a medical condition or this instruction, always ask your healthcare professional. Romayne Alderman, [facility], disclaims any warranty or liability for your use of this information.         Advance Directives: Care Instructions  Overview  An advance directive is a legal way to state your wishes at the end of your life. It tells your loved ones and doctor what to do if you can't say what you want.  There are two main types of advance directives. You can change them any time your wishes change.  Living will. This form tells your loved ones and doctor your wishes about life support and other treatment. The form is also called a declaration.  Medical power of attorney. This form lets you name a person to make treatment decisions for you when you can't speak for yourself. This person is called a health care agent (health care proxy, health care surrogate). The form is also called a durable power of attorney for health care.  If you do not have an advance directive, decisions about your medical care may be made by a family member or doctor who doesn't know you or by a judge.  It may help to think of an advance directive as a gift to the people who care for you. If you have one, they won't have to make tough decisions by themselves.  For more information, including forms for your state, see the [employer] website ([employer].com.cy).  Follow-up care is a key part of your treatment and safety. Be sure to make and go to all appointments, and call your doctor if you are having problems. It's also a good idea to know your test results and keep a list of the medicines you take.  What should you include in an advance directive?  Many states have a unique advance directive form. (It may ask you to address specific issues.) Or you  might use a universal form that's approved by many states.  If your form doesn't tell you what to  address, it may be hard to know what to include in your advance directive. Use the questions below to help you get started.  Who do you want to make decisions about your medical care if you are not able to?  What life-support measures do you want if you have a serious illness that gets worse over time or can't be cured?  What are you most afraid of that might happen? (Maybe you're afraid of having pain, losing your independence, or being kept alive by machines.)  Where would you prefer to die? (Your home? A hospital? A nursing home?)  Do you want to donate your organs when you die?  Do you want certain religious practices performed before you die?  When should you call for help?  Be sure to contact your doctor if you have any questions.  Where can you learn more?  Go to [employer] and enter R264 to learn more about Advance Directives: Care Instructions.  Current as of: December 13, 2022  Content Version: 14.5   [address] [place].   Care instructions adapted under license by [facility]. If you have questions about a medical condition or this instruction, always ask your healthcare professional. Romayne Alderman, [facility], disclaims any warranty or liability for your use of this information.         A Healthy Heart: Care Instructions  Overview     Coronary artery disease, also called heart disease, occurs when a substance called plaque builds up in the vessels that supply oxygen-rich blood to your heart muscle. This can narrow the blood vessels and reduce blood flow. A heart attack happens when blood flow is completely blocked. A high-fat diet, smoking, and other factors increase the risk of heart disease.  Your doctor has found that you have a chance of having heart disease. A heart-healthy lifestyle can help keep your heart healthy and prevent heart disease. This lifestyle includes eating healthy, being active, staying at a weight that's healthy for you, and not smoking or  using tobacco. It also includes taking medicines as directed, managing other health conditions, and trying to get a healthy amount of sleep.  Follow-up care is a key part of your treatment and safety. Be sure to make and go to all appointments, and call your doctor if you are having problems. It's also a good idea to know your test results and keep a list of the medicines you take.  How can you care for yourself at home?  Diet    Use less salt when you cook and eat. This helps lower your blood pressure. Taste food before salting. Add only a little salt when you think you need it. With time, your taste buds will adjust to less salt.     Eat fewer snack items, fast foods, canned soups, and other high-salt, high-fat, processed foods.     Read food labels and try to avoid saturated and trans fats. They increase your risk of heart disease by raising cholesterol levels.     Limit the amount of solid fat--butter, margarine, and shortening--you eat. Use olive, peanut, or canola oil when you cook. Bake, broil, and steam foods instead of frying them.     Eat a variety of fruit and vegetables every day. Dark green, deep orange, red, or yellow fruits and vegetables are especially good for you. Examples include spinach, carrots, peaches, and  berries.     Foods high in fiber can reduce your cholesterol and provide important vitamins and minerals. High-fiber foods include whole-grain cereals and breads, oatmeal, beans, brown rice, citrus fruits, and apples.     Eat lean proteins. Heart-healthy proteins include seafood, lean meats and poultry, eggs, beans, peas, nuts, seeds, and soy products.     Limit drinks and foods with added sugar. These include candy, desserts, and soda pop.   Heart-healthy lifestyle    If your doctor recommends it, get more exercise. For many people, walking is a good choice. Or you may want to swim, bike, or do other activities. Bit by bit, increase the time you're active every day. Try for at least 30  minutes on most days of the week.     Try to quit or cut back on using tobacco and other nicotine products. This includes smoking and vaping. If you need help quitting, talk to your doctor about stop-smoking programs and medicines. These can increase your chances of quitting for good. Quitting is one of the most important things you can do to protect your heart. It is never too late to quit. Try to avoid secondhand smoke too.     Stay at a weight that's healthy for you. Talk to your doctor if you need help losing weight.     Try to get 7 to 9 hours of sleep each night.     Limit alcohol to 2 drinks a day for men and 1 drink a day for women. Too much alcohol can cause health problems.     Manage other health problems such as diabetes, high blood pressure, and high cholesterol. If you think you may have a problem with alcohol or drug use, talk to your doctor.   Medicines    Take your medicines exactly as prescribed. Call your doctor if you think you are having a problem with your medicine.     If your doctor recommends aspirin, take the amount directed each day. Make sure you take aspirin and not another kind of pain reliever, such as acetaminophen  (Tylenol ).   When should you call for help?   Call 911 if you have symptoms of a heart attack. These may include:    Chest pain or pressure, or a strange feeling in the chest.     Sweating.     Shortness of breath.     Pain, pressure, or a strange feeling in the back, neck, jaw, or upper belly or in one or both shoulders or arms.     Lightheadedness or sudden weakness.     A fast or irregular heartbeat.   After you call 911, the operator may tell you to chew 1 adult-strength or 2 to 4 low-dose aspirin. Wait for an ambulance. Do not try to drive yourself.  Watch closely for changes in your health, and be sure to contact your doctor if you have any problems.  Where can you learn more?  Go to [employer] and enter F075 to learn more about A [facility]  [facility]: Care Instructions.  Current as of: March 15, 2023  Content Version: 14.5   [address] [place].   Care instructions adapted under license by [facility]. If you have questions about a medical condition or this instruction, always ask your healthcare professional. Romayne Alderman, [facility], disclaims any warranty or liability for your use of this information.    Personalized Preventive Plan for Wilburn Keir - 03/21/2024  Medicare offers a range of  preventive health benefits. Some of the tests and screenings are paid in full while other may be subject to a deductible, co-insurance, and/or copay.  Some of these benefits include a comprehensive review of your medical history including lifestyle, illnesses that may run in your family, and various assessments and screenings as appropriate.  After reviewing your medical record and screening and assessments performed today your provider may have ordered immunizations, labs, imaging, and/or referrals for you.  A list of these orders (if applicable) as well as your Preventive Care list are included within your After Visit Summary for your review.

## 2024-03-21 NOTE — Progress Notes (Signed)
 Subjective:      Patient ID: Hunter Higgins is a 73 y.o. male.    Follow-up    Patient came for routine follow-up for diabetes.  Patient stated he is compliant with treatment.  Will check A1c, CMP, LFTs and lipid panel.  Statin stopped by nephrology, currently on lifestyle changes.  Neuropathy pain is controlled on gabapentin .  No other complaints or concerns.        Essential hyypertension  Nephrology stopped - nifedipine  according to patient due to soft BP.  Nephrology stopped-losartan  25 mg on the days that he does not have dialysis after checking blood pressure, day 1 tablet if blood pressure is >140/90.  Blood pressure seems to be controlled after dialysis sessions but elevated in between dialysis.    Physical deconditioning  Recommended PT OT, SN, Home health.  Wound care as follows: foot injury 3 times a week (since patient has dialysis sessions)    Diabetic foot s/p amputation left foot /bilateral shin skin injury  Following up with podiatry and wound care.    GERD  famotidine (PEPCID) 20 MG tablet, twice daily as needed    Diabetes mellitus type 2  Dulaglutide  (TRULICITY ) 0.75 MG/0.5ML SOPN, weekly.  Nephrology stopped - atorvastatin  40 mg daily    Diabetic neuropathy  gabapentin  (NEURONTIN ) 300 MG capsule, Take 1 capsule by mouth 3 times daily.    ESRD  On dialysis 3 times a day.    OTC  aspirin 81 MG chewable tablet.    Hypertension  Pertinent negatives include no chest pain or shortness of breath.   Diabetes  Pertinent negatives for diabetes include no chest pain.   Gastroesophageal Reflux  He reports no abdominal pain or no chest pain.   Hyperlipidemia  Pertinent negatives include no chest pain or shortness of breath.   Wound Check    Eye Pain   Pertinent negatives include no fever.   Fall  Pertinent negatives include no abdominal pain or fever.   Shortness of Breath  Pertinent negatives include no abdominal pain, chest pain or fever.       Review of Systems   Constitutional:  Negative for fever.    Respiratory:  Negative for shortness of breath.    Cardiovascular:  Negative for chest pain.   Gastrointestinal:  Negative for abdominal pain.       Objective:   Visit Vitals  BP (!) 148/72 (BP Cuff Size: Small Adult)   Pulse 72   Temp 97.3 F (36.3 C) (Temporal)   Resp 16   Ht 1.676 m (5' 6)   Wt 77.2 kg (170 lb 3.2 oz)   SpO2 98%   BMI 27.47 kg/m        Physical Exam  Cardiovascular:      Rate and Rhythm: Normal rate and regular rhythm.      Heart sounds: S1 normal and S2 normal.      No S3 or S4 sounds.   Pulmonary:      Effort: Pulmonary effort is normal.      Breath sounds: Normal breath sounds.      Comments: No crackles or wheezing.  Abdominal:      General: Abdomen is flat. Bowel sounds are normal.   Neurological:      Mental Status: He is alert.         Assessment:       ICD-10-CM    1. Medicare annual wellness visit, subsequent  Z00.00       2. ACP (  advance care planning)  Z71.89 PR Advanced Care Planning (16-30 minutes) [99497]      3. Type 2 diabetes mellitus with other specified complication, with long-term current use of insulin (HCC)  E11.69 dulaglutide  (TRULICITY ) 0.75 MG/0.5ML SOAJ SC injection    Z79.4 Lipid Panel     Comprehensive Metabolic Panel     Hemoglobin J8R      4. Other chronic pain  G89.29 gabapentin  (NEURONTIN ) 300 MG capsule      5. Screening for colon cancer  Z12.11 Cologuard (Fecal DNA Colorectal Cancer Screening)              Plan:   Return in about 6 months (around 09/16/2024) for Follow up, Treatment monitoring.      Toribio CHRISTELLA Andrez Sharyl, MD

## 2024-03-25 LAB — HEMOGLOBIN A1C
Estimated Avg Glucose, External: 144 mg/dL — ABNORMAL HIGH (ref 91–123)
Hemoglobin A1C, External: 6.6 % — ABNORMAL HIGH (ref 4.8–5.6)

## 2024-04-18 NOTE — Unmapped (Signed)
 Brief Operative Note    Hunter Higgins  04/18/2024    Pre-Op Dx: Left lower extremity arterial insufficiency   Post-Op Dx: Same    Procedure:   1. Ultrasound-guided right common femoral artery access  2. Left lower extremity arteriogram  3. Orbital atherectomy of left anterior tibial artery  4. Balloon angioplasty of left anterior artery  5. Exoseal closure device    Surgeon: Krystal Means, MD    Fellow: Reyes Carota, MD    Anesthesia:   Local with lidocaine  Moderate sedation    Findings: see below  Complications: none  EBL: minimal  Specimens: none    Findings:  Femoral Vessels:   - left CFA: widely patent  - left SFA: mild stenosis  - left profunda femoris: patent  Popliteal: patent  Tibial Vessels:   - left AT: occluded with reconstitution distally into the foot, recanalization after atherectomy and balloon angioplasty  - left TP trunk: mild stenosis  - left peroneal: patent  - left PT: patent down into the foot    Plan: Start Plavix , continue ASA, follow-up in 1 month with arterial duplex and ABIs        Reyes Carota, MD [phone]  Vascular Surgery Fellow

## 2024-04-18 NOTE — Unmapped (Signed)
 [facility]  Cardiac, Vascular and Interventional Radiology Services  [address], [place], [place] [address]     Pre-Procedure Instructions     Confirmed patient name and DOB. Instructions reviewed with patient who verbalized understanding.         Patient Information      Patient Name: Hunter Higgins  Procedure: Left leg angio   Procedure Date:  04/18/2024  Check in time: 06:30am     When you arrive at [facility]:      Enter through the main hospital entrance and let Guest services know you are arriving for a procedure.   You will then be directed to the registration desk.  Your accompanying person will need to show a valid ID to obtain a visitor badge.   Wheelchairs are available at the front desk when you enter the building.      Preparing to Come      Please make every effort to arrive at your scheduled check in time. Arriving late may affect your procedure.   You may shower, use deodorant, and brush your teeth, however, please do not use aerosol sprays, perfumes, skin creams, lotions, oils, or powders.   IT IS REQUIRED for someone to drive you home after your procedure. A taxi or ride service such as [employer] or [place] is not acceptable unless you are accompanied by a responsible adult.   It is recommended to have someone with you overnight unless you have had general anesthesia which will require someone to stay with you 24 hours after you have been discharged.                                     Responsible Adult: Nurse aide    We require pre-procedure labs.                                                                          Date of lab collection: completed    Pre-procedure screening completed, patient currently without s/s of any virus or infection. If any feelings of illness develop (cold, sore throat, nausea, vomiting, fever, flu, new onset swollen lymph nodes, or new unexplained rash) promptly notify your ordering physician. If you develop any of these symptoms or will be delayed  for any reason the morning of your procedure, call [phone] to notify the scheduler you will not be in for your procedure.      Food/Liquids      Do not eat any solid foods or drink any liquids after midnight the night before your procedure.   Do not drink alcoholic beverages for 24 hours prior to your procedure.     Medications      Patient medication list reviewed. Take the following medications as prescribed with a sip of water on the morning of the procedure: FOLLOW SPECIFIC MEDICATION FROM [facility] ([facility]).  Vitamins or supplements should be held on the day of the procedure.  Patient is on Aspirin per medication list, continue to take as prescribed per [facility].  Patient is on trulicity , hold 7 days before procedure per [facility]. Patient confirmed last dose was 04/09/2024.  Belongings           Please do not bring valuables (jewelry, money, credit cards).  They should be left at home.   Jewelry (including piercings) must be removed prior to going into the procedure room.  Wear comfortable clothing that is easy to put on and remove. You will be asked to remove all clothing, including undergarments, and put on a hospital gown and hospital socks.   Glasses may need to be removed prior to going to the procedure room.  Please bring a case for your glasses.   Please bring a case for dentures in case you are asked to remove them. If you are having general anesthesia, you will be required to remove them prior to the procedure.       Items to Bring with You         Please bring the following with you when you come to the hospital:      Photo ID card  Insurance card  Up to date list of allergies and medications  Asthma/COPD/Emphysema Inhalers if used.  CPAP machine if used at home and plan is to admit following procedure. This is not needed for same-day procedure and discharge.      Current Health and safety inspector as follows:    -Visitors are welcome in the hospital but will be screened on entry.   Children under age 74 must always have a parent or adult with them. It may not be the patient.   -Staff are not able to watch children for patients.   -Due to new visitor badge system, all visitors over age 39 must have photo ID  -Masking is no longer required but they are available if desired.      Patient informed to disregard MyChart check in notification time for this procedure. If there is a change to check in time the patient will be contacted by a staff member.    Patient confirmed understanding of the above information.      [facility]  Cardiac, Vascular and Interventional Radiology     [phone]  Scheduling/ [phone] PASS office.     PASS time for this patient: 8 minutes.

## 2024-04-18 NOTE — Progress Notes (Addendum)
 Moderate Sedation was administered under supervision of Dr. Beverley    IV Fentanyl 125 mcg and Versed 2.5 mg   Patient was constantly monitored     Start upfz:9177  Stop time:1002  Total time:100 min     Ronal Rumble, RN

## 2024-04-18 NOTE — H&P (Signed)
 ASSESSMENT:  73yo male patient presenting to the office for evaluation of PAD. The L foot ulcers are healing slowly.  He is currently on antibiotics prescribed by Dr. Hayes. Although his LLE ABI has not worsened, I am concerned with the healing progress of these chronic wounds.  We discussed LLE angiogram with possible intervention to which the patient was amenable to procedure.  Discussed with Dr. Severo who agrees with plan.    VASCULAR Attending  Reexamined immediately prior to procedure.  Plan for LLE arteriogram w/ intervention unchanged.    Krystal LELON Means, MD  [facility]  Pg [phone]  Ofc [phone]  Cell [phone]      DX: The conditions listed below are  worsened            Encounter Diagnoses   Code Name Primary?   . I73.9 PAD (peripheral artery disease) Yes   . N18.6 End stage renal disease (HCC)     . E11.621, O02.577 Diabetic ulcer of left midfoot associated with type 2 diabetes mellitus, with fat layer exposed (HCC)     . Z01.818 Preop testing           PLAN:   -Continue ASA, Statin  -Continue medical management of other co-morbidities, such as HTN and DM. Blood pressure controlled with medications. Last A1c measured            Hemoglobin A1c   Date Value Ref Range Status   12/02/2022 7.4 (H) 4.8 - 5.6 % Final      -Continue walking/exercise  -Follow up with podiatry as scheduled.  Appreciate their recommendations  -Continue dialysis MWF  -Router for LLE angiogram with possible intervention           Orders Placed This Encounter   Procedures   . Basic Metabolic Panel   . CBC      No orders of the defined types were placed in this encounter.        __________________________________________     HPI: 73 y.o. year old male states symptoms are leg pain and swelling.  The patient has had history of LLE angioplasty, SFA and posterior tibial artery on 02/10/2023 2/2 L foot ulcer.  He is on ASA and statin.  The patient reports that he has swelling and pain to his L foot.  He finally  has HH wound care set up and they are coming tomorrow.  He does follow with Dr. Janise and Dr. Hayes for podiatry.  He is currently on doxycycline for concern of wound infection to the L foot.  He does walk with his walker, but not frequently.  He denies claudication or ischemic rest pain.     WOUND CLINIC PHOTOS FROM 12/05/2023                       Past Medical History:   Diagnosis Date   . Abnormal nuclear stress test     . Chronic diastolic (congestive) heart failure (HCC)     . Chronic kidney disease, stage IV (severe) (HCC)     . Diabetic foot ulcer (HCC)     . Diabetic retinopathy (HCC)     . DM2 (diabetes mellitus, type 2) (HCC)     . Essential (primary) hypertension     . GERD (gastroesophageal reflux disease)     . Male erectile dysfunction, unspecified     . Peripheral arterial disease     . Prostate cancer (HCC)     .  Sarcoidosis, unspecified       In remission   . SBO (small bowel obstruction) (HCC)     . Vitreous hemorrhage (HCC)                  Past Surgical History:   Procedure Laterality Date   . AMPUTATION Left 02/01/2023     Procedure: AMPUTATION, FOOT, TRANSMETATARSAL;  Surgeon: Raeanne Faden, DPM   . AMPUTATION Left 02/10/2023     Procedure: AMPUTATION, FOOT, TRANSMETATARSAL;  Surgeon: Hayes Tresea MATSU, DPM   . EXPLORATORY LAPAROTOMY         Lysis of adhesions   . HEART CATHETERIZATION   12/30/2020         . HERNIA REPAIR         Inguinal   . ORTHOPEDIC SURGERY         Shoulder   . RADICAL PROSTATECTOMY       . VENOUS ACCESS CATHETER INSERTION N/A 12/28/2020     Procedure: INSERTION, CATHETER, TUNNELED, FOR DIALYSIS;  Surgeon: Alfornia Peck, MD   . VENOUS ACCESS CATHETER REPLACEMENT N/A 04/09/2021     Procedure: REPLACEMENT, CATHETER, HEMODIALYSIS, TUNNELED;  Surgeon: Margart Rank, Arlene F, MD              Tobacco Use: Medium Risk (12/14/2023)     Patient History     . Smoking Tobacco Use: Former     . Smokeless Tobacco Use: Never     . Passive Exposure: Not on file            Home Medication List - Marked  as Reviewed on 12/14/23 0944   Medication Sig   aspirin 81 mg PO CHEW Take 1 Tab by Mouth Once a Day.   atorvastatin  (LIPITOR) 20 mg PO TABS Take 1 Tab by Mouth Every Night at Bedtime.  Patient not taking: Reported on 08/02/2023   b complex-vitamin c-folic acid (NEPHRO/TRIPHROCAPS) 1 mg PO CAPS Take 1 Cap by Mouth Once a Day.   calcitRIOL (ROCALTROL) 0.25 mcg PO CAPS Take 1 Cap by Mouth Once a Day.   cyclobenzaprine  (FLEXERIL ) 5 mg PO TABS Take 1 Tab by Mouth 3 Times Daily.   Doxycycline Hyclate 100 mg PO CAPS Take 1 Cap by Mouth Twice Daily.   dulaglutide  (TRULICITY ) 0.75 mg/0.5 mL SC Pen Injector Inject 0.5 mL beneath the skin Every 7 Days. Tuesday   epoetin alfa, ESRD, (EPOGEN; PROCRIT) 4,000 unit/mL Inj SOLN Inject 1 mL beneath the skin mon-wed-fri at 2100.   epoetin alfa-epbx, ESRD, (RETACRIT) 10,000 unit/mL Inj SOLN Inject 0.4 mL beneath the skin Every Monday, Wednesday & Friday.   ferrous sulfate (FEOSOL) 325 mg (65 mg Iron) PO TABS Take 1 Tab by Mouth Every Other Day.   NIFEdipine  24HR (PROCARDIA  XL) 90 mg PO TR24 Take 1 Tab by Mouth Once a Day.   oxyCODONE-acetaminophen  (PERCOCET) 5-325 mg PO TABS Take 1 Tab by Mouth Every 8 Hours As Needed for Moderate Pain (Pain Score 4-6).  Patient not taking: Reported on 12/14/2023   polyethylene glycol (MIRALAX) 17 gram PO PwPk Take 1 Packet by Mouth Once a Day.   sevelamer  carbonate (RENVELA ) 800 mg PO TABS Take 1 Tab by Mouth Three Times Daily with Meals.   sodium hypochlorite (DAKINS 1/4 STRENGTH) 0.125 % Misc SOLN Use 1 Application to affected area Q2D 0900. Indications: skin disinfection   TYPE-IN DME Wound Care Orders     Start Aquacel Ag to the amputation site and heel ulcer left foot  with 4x4's, 4 Kerlix and 4 ace wrap.  Change 3 times a week         PHYSICAL EXAM:   BP 136/76 (Site: Arm Upper L, Position: In chair)   Ht 5' 6 (1.676 m)   Wt 77.6 kg (171 lb)   BMI 27.60 kg/m    CONSTITUTIONAL: AAO, no acute signs and symptoms of distress  MOUTH: Oral mucosa  moist   NECK: No JVD or thyromegaly  CARDIAC: Regular rate and rhythm. Audible doppler signals to L DP, PT and popliteal  LUNGS: Respiratory effort unlabored  MSK: Normal muscular tone and strength, toe amputation noted to L foot.  SKIN: There is a full thickness ulcer noted to the dorsum of the L foot 2nd toe. Wound bed appears moist with slough. Partial thickness ulcer to the dorsum of 3rd digit.   There is also an ulcer noted to the L plantar aspect. Please see photos.                       DATA REVIEWED: I independently reviewed the image of the PVL        Recent Results (from the past 36 hours)   1. PVL ANKLE-BRACHIAL INDEX     Narrative     Bilateral: A bilateral lower extremity ankle brachial index exam was performed. Continuous wave Doppler tracings were recorded from the posterior tibial and dorsalis pedis arteries bilaterally, and resting ankle and brachial pressures were measured.  Right: The ankle brachial index was 0.89 on the right. The right lower extremity waveforms were poorly biphasic in the posterior tibial and monophasic in the dorsalis pedis arteries.  Left: The ankle brachial index was 0.68 on the left. The left lower extremity waveforms were not positively identified in the posterior tibial and monophasic in the dorsalis pedis arteries.  Conclusions: Moderate bilateral lower extremity arterial insufficiency.  When compared to the previous exam performed on 04/05/2023, there has been improvement on the right and no significant change on the left.

## 2024-04-19 ENCOUNTER — Telehealth

## 2024-04-19 NOTE — Telephone Encounter (Signed)
 Received a call from Hunter Higgins, [birth year] in regards to Plavix  medication request.     Two patient identifier's verified.      Hunter Higgins is stating that he had surgery on yesterday and the Doctor prescribed Plavix  for him, however the pharmacy will not fill the request due the doctor is not licensed in [place]. Doctor is licensed in [place].Patient is requesting for Dr Andrez to prescribed the Plavix  for him. Patient contact # is [phone]    Clinical team notified.

## 2024-04-23 ENCOUNTER — Encounter: Payer: Medicare (Managed Care) | Attending: Internal Medicine | Primary: Internal Medicine

## 2024-04-23 MED ORDER — CLOPIDOGREL BISULFATE 75 MG PO TABS
75 | ORAL_TABLET | Freq: Every day | ORAL | 3 refills | Status: AC
Start: 2024-04-23 — End: ?

## 2024-04-23 NOTE — Telephone Encounter (Signed)
 Patient was called.  2 steps verification.  According to the patient, the person who did the procedure did not have license for [facility] and he could not refill his Plavix .  I will go ahead and give him a prescription but I instructed him to contact vascular and ask for how long he will be taking Plavix .  Patient agreed with the plan.

## 2024-05-01 ENCOUNTER — Ambulatory Visit
Admit: 2024-05-01 | Discharge: 2024-05-01 | Payer: Medicare (Managed Care) | Attending: Internal Medicine | Primary: Internal Medicine

## 2024-05-01 VITALS — BP 140/84 | HR 65 | Temp 97.20000°F | Resp 17 | Ht 66.0 in | Wt 167.0 lb

## 2024-05-01 DIAGNOSIS — J384 Edema of larynx: Principal | ICD-10-CM

## 2024-05-01 NOTE — Addendum Note (Signed)
 Addended by: ANDREZ SHARYL TORIBIO CHRISTELLA on: 05/01/2024 01:54 PM     Modules accepted: Orders

## 2024-05-01 NOTE — Progress Notes (Addendum)
 Subjective:      Patient ID: Hunter Higgins is a 73 y.o. male.    Follow-up    Patient was seen in the hospital 03/24/2024 for shortness of breath.    He felt his throat was tight.  CT chest negative for PE but demonstrated subglottic edema he has stridor, given IV Decadron and transferred to ENT.    While in ICU, he had good oxygen saturations.  Stridor and glottic edema was likely secondary to parainfluenza since he was positive.    He has an outpatient follow-up with ENT in 3 weeks.  He is currently doing well, no shortness of breath, no throat tightness.  No fever or chills.  No URI symptoms or signs.    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  Patient was sent to speech pathologist for respiratory retraining therapy.  Follow-up in 4 months.  Will follow-up with ENT as scheduled.    I spent 15 minutes reviewing the records and discussing with the patient the next step in his workup.  Red flags were discussed if any to go to ED.  Patient agreed.          Essential hyypertension  Nephrology stopped - nifedipine  according to patient due to soft BP.  Nephrology stopped-losartan  25 mg on the days that he does not have dialysis after checking blood pressure, day 1 tablet if blood pressure is >140/90.  Blood pressure seems to be controlled after dialysis sessions but elevated in between dialysis.    Physical deconditioning  Recommended PT OT, SN, Home health.  Wound care as follows: foot injury 3 times a week (since patient has dialysis sessions)    Diabetic foot s/p amputation left foot /bilateral shin skin injury  Following up with podiatry and wound care.    GERD  famotidine (PEPCID) 20 MG tablet, twice daily as needed    Diabetes mellitus type 2  Dulaglutide   (TRULICITY ) 0.75 MG/0.5ML SOPN, weekly.  Nephrology stopped - atorvastatin  40 mg daily    Diabetic neuropathy  gabapentin  (NEURONTIN ) 300 MG capsule, Take 1 capsule by mouth 3 times daily.    ESRD  On dialysis 3 times a day.    OTC  aspirin 81 MG chewable tablet.    Hypertension  Pertinent negatives include no chest pain or shortness of breath.   Diabetes  Pertinent negatives for diabetes include no chest pain.   Gastroesophageal Reflux  He reports no abdominal pain or no chest pain.   Hyperlipidemia  Pertinent negatives include no chest pain or shortness of breath.   Wound Check    Eye Pain   Pertinent negatives include no fever.   Fall  Pertinent negatives include no abdominal pain or fever.   Shortness of Breath  Pertinent negatives include no abdominal pain, chest pain or fever.       Review of Systems   Constitutional:  Negative for chills and fever.   Eyes:  Positive for pain.   Respiratory:  Negative for shortness of breath.    Cardiovascular:  Negative for chest pain.   Gastrointestinal:  Negative for abdominal pain.       Objective:   Visit Vitals  BP (!) 140/84 (BP Site: Left Upper Arm, Patient Position: Sitting, BP Cuff Size: Large Adult)  Pulse 65   Temp 97.2 F (36.2 C) (Temporal)   Resp 17   Ht 1.676 m (5' 6)   Wt 75.8 kg (167 lb)   SpO2 97%   BMI 26.95 kg/m        Physical Exam  HENT:      Mouth/Throat:      Mouth: Mucous membranes are moist.      Pharynx: Oropharynx is clear.      Tonsils: No tonsillar exudate or tonsillar abscesses.   Neck:      Trachea: Trachea normal.   Cardiovascular:      Rate and Rhythm: Normal rate and regular rhythm.      Heart sounds: S1 normal and S2 normal.      No S3 or S4 sounds.   Pulmonary:      Effort: Pulmonary effort is normal.      Breath sounds: Normal breath sounds.      Comments: No crackles or wheezing.  Abdominal:      General: Abdomen is flat. Bowel sounds are normal.   Musculoskeletal:      Cervical back: Normal range of motion.      Right lower leg: No  edema.      Left lower leg: No edema.   Lymphadenopathy:      Cervical: No cervical adenopathy.   Neurological:      Mental Status: He is alert.         Assessment:       ICD-10-CM    1. Edema glottis  J38.4 External Referral To Rheumatology      2. 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

## 2024-05-27 ENCOUNTER — Encounter

## 2024-05-28 MED ORDER — TRULICITY 0.75 MG/0.5ML SC SOAJ
0.75 | SUBCUTANEOUS | 1 refills | Status: AC
Start: 2024-05-28 — End: ?
